# Patient Record
Sex: Female | Born: 1980
Health system: Southern US, Community
[De-identification: ages and names within clinical notes are randomized; demographics above are authoritative.]

## PROBLEM LIST (undated history)

## (undated) DIAGNOSIS — Z720 Tobacco use: Secondary | ICD-10-CM

## (undated) DIAGNOSIS — F329 Major depressive disorder, single episode, unspecified: Secondary | ICD-10-CM

## (undated) DIAGNOSIS — I059 Rheumatic mitral valve disease, unspecified: Secondary | ICD-10-CM

## (undated) DIAGNOSIS — I071 Rheumatic tricuspid insufficiency: Secondary | ICD-10-CM

## (undated) DIAGNOSIS — I319 Disease of pericardium, unspecified: Secondary | ICD-10-CM

## (undated) DIAGNOSIS — R519 Headache, unspecified: Secondary | ICD-10-CM

## (undated) DIAGNOSIS — J42 Unspecified chronic bronchitis: Secondary | ICD-10-CM

## (undated) DIAGNOSIS — I10 Essential (primary) hypertension: Secondary | ICD-10-CM

## (undated) DIAGNOSIS — R002 Palpitations: Secondary | ICD-10-CM

## (undated) DIAGNOSIS — I493 Ventricular premature depolarization: Secondary | ICD-10-CM

## (undated) DIAGNOSIS — Z9889 Other specified postprocedural states: Secondary | ICD-10-CM

## (undated) DIAGNOSIS — Z954 Presence of other heart-valve replacement: Secondary | ICD-10-CM

## (undated) DIAGNOSIS — F32A Depression, unspecified: Secondary | ICD-10-CM

## (undated) DIAGNOSIS — I5032 Chronic diastolic (congestive) heart failure: Secondary | ICD-10-CM

## (undated) DIAGNOSIS — F319 Bipolar disorder, unspecified: Secondary | ICD-10-CM

## (undated) DIAGNOSIS — R0600 Dyspnea, unspecified: Secondary | ICD-10-CM

## (undated) DIAGNOSIS — M545 Low back pain, unspecified: Secondary | ICD-10-CM

## (undated) DIAGNOSIS — I491 Atrial premature depolarization: Secondary | ICD-10-CM

## (undated) DIAGNOSIS — M1990 Unspecified osteoarthritis, unspecified site: Secondary | ICD-10-CM

## (undated) DIAGNOSIS — K219 Gastro-esophageal reflux disease without esophagitis: Secondary | ICD-10-CM

## (undated) DIAGNOSIS — G8929 Other chronic pain: Secondary | ICD-10-CM

## (undated) DIAGNOSIS — J189 Pneumonia, unspecified organism: Secondary | ICD-10-CM

## (undated) DIAGNOSIS — I052 Rheumatic mitral stenosis with insufficiency: Secondary | ICD-10-CM

## (undated) DIAGNOSIS — M5136 Other intervertebral disc degeneration, lumbar region: Secondary | ICD-10-CM

## (undated) DIAGNOSIS — R51 Headache: Secondary | ICD-10-CM

## (undated) DIAGNOSIS — D649 Anemia, unspecified: Secondary | ICD-10-CM

## (undated) DIAGNOSIS — F419 Anxiety disorder, unspecified: Secondary | ICD-10-CM

## (undated) DIAGNOSIS — I272 Pulmonary hypertension, unspecified: Secondary | ICD-10-CM

## (undated) DIAGNOSIS — M51369 Other intervertebral disc degeneration, lumbar region without mention of lumbar back pain or lower extremity pain: Secondary | ICD-10-CM

## (undated) DIAGNOSIS — R011 Cardiac murmur, unspecified: Secondary | ICD-10-CM

## (undated) DIAGNOSIS — D869 Sarcoidosis, unspecified: Secondary | ICD-10-CM

## (undated) DIAGNOSIS — E669 Obesity, unspecified: Secondary | ICD-10-CM

## (undated) HISTORY — DX: Obesity, unspecified: E66.9

## (undated) HISTORY — DX: Disease of pericardium, unspecified: I31.9

## (undated) HISTORY — DX: Other specified postprocedural states: Z98.890

## (undated) HISTORY — DX: Rheumatic mitral stenosis with insufficiency: I05.2

## (undated) HISTORY — DX: Anemia, unspecified: D64.9

## (undated) HISTORY — DX: Tobacco use: Z72.0

## (undated) HISTORY — DX: Atrial premature depolarization: I49.1

## (undated) HISTORY — DX: Ventricular premature depolarization: I49.3

## (undated) HISTORY — DX: Rheumatic tricuspid insufficiency: I07.1

## (undated) HISTORY — PX: HERNIA REPAIR: SHX51

## (undated) HISTORY — PX: ABDOMINAL HERNIA REPAIR: SHX539

## (undated) HISTORY — DX: Chronic diastolic (congestive) heart failure: I50.32

## (undated) HISTORY — DX: Sarcoidosis, unspecified: D86.9

## (undated) HISTORY — DX: Essential (primary) hypertension: I10

## (undated) HISTORY — DX: Rheumatic mitral valve disease, unspecified: I05.9

---
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
## 2009-09-13 HISTORY — PX: PARTIAL HYSTERECTOMY: SHX80

## 2009-09-13 HISTORY — PX: ABDOMINAL HYSTERECTOMY: SHX81

## 2012-09-13 HISTORY — PX: CARDIAC CATHETERIZATION: SHX172

## 2013-03-13 DIAGNOSIS — Z9889 Other specified postprocedural states: Secondary | ICD-10-CM

## 2013-03-13 HISTORY — DX: Other specified postprocedural states: Z98.890

## 2013-03-27 DIAGNOSIS — Z9889 Other specified postprocedural states: Secondary | ICD-10-CM

## 2013-03-27 HISTORY — PX: MITRAL VALVE REPAIR: SHX2039

## 2015-11-12 ENCOUNTER — Other Ambulatory Visit: Payer: Self-pay | Admitting: *Deleted

## 2015-11-12 ENCOUNTER — Ambulatory Visit (INDEPENDENT_AMBULATORY_CARE_PROVIDER_SITE_OTHER): Payer: Medicaid Other | Admitting: Cardiovascular Disease

## 2015-11-12 ENCOUNTER — Encounter: Payer: Self-pay | Admitting: *Deleted

## 2015-11-12 VITALS — BP 92/56 | HR 101 | Ht 63.0 in | Wt 236.0 lb

## 2015-11-12 DIAGNOSIS — R011 Cardiac murmur, unspecified: Secondary | ICD-10-CM

## 2015-11-12 DIAGNOSIS — Z9889 Other specified postprocedural states: Secondary | ICD-10-CM | POA: Diagnosis not present

## 2015-11-12 DIAGNOSIS — I10 Essential (primary) hypertension: Secondary | ICD-10-CM

## 2015-11-12 DIAGNOSIS — I509 Heart failure, unspecified: Secondary | ICD-10-CM | POA: Diagnosis not present

## 2015-11-12 DIAGNOSIS — Z9289 Personal history of other medical treatment: Secondary | ICD-10-CM

## 2015-11-12 DIAGNOSIS — Z87898 Personal history of other specified conditions: Secondary | ICD-10-CM

## 2015-11-12 NOTE — Patient Instructions (Signed)
Your physician recommends that you schedule a follow-up appointment in: 1 MONTH WITH DR. Purvis Sheffield  Your physician recommends that you continue on your current medications as directed. Please refer to the Current Medication list given to you today.  Your physician recommends that you return for lab work CBC/BMP  Your physician has requested that you have an echocardiogram. Echocardiography is a painless test that uses sound waves to create images of your heart. It provides your doctor with information about the size and shape of your heart and how well your heart's chambers and valves are working. This procedure takes approximately one hour. There are no restrictions for this procedure.   Thank you for choosing [facility]!!

## 2015-11-12 NOTE — Progress Notes (Signed)
Patient ID: Theresa Crawford, female   DOB: 12-28-1980, 35 y.o.   MRN: [id number]       CARDIOLOGY CONSULT NOTE  Patient ID: Theresa Crawford MRN: [id number] DOB/AGE: 04/22/1981 35 y.o.  Admit date: (Not on file) Primary Physician House, Eugenio Hoes, [name]  Reason for Consultation: Valvular heart disease  HPI: The patient is a 35 yr old woman who reportedly has a history of congestive heart failure and was hospitalized for this in November 2016 at [facility]. I do not have these records. She reportedly has a history of mitral valve repair in July 2014 with an annuloplasty ring but has had repeated episodes of CHF ever since. She moved to [place] from [place] in November 2016. She also has a h/o HTN.  She tells me that she has not felt well ever since her surgery. She experiences chest pain and exertional dyspnea after walking 50 yards. She sleeps with 3 pillows propped up in order to breathe easily. She describes paroxysmal nocturnal dyspnea. She also has lightheadedness and dizziness but no syncope. She said when she retains fluid it tends to collect in her abdomen and not her hands and feet. She tells me she had no blockages by preoperative cardiac catheterization. She said her blood pressure normally runs high yet it is 92/56 today. She is on Plavix but is uncertain why. She thinks she may have had a TIA. She said her problems all began 2009 when she was pregnant with her son and developed pericarditis She had untreated strep throat as a child.   Labs available from 08/12/15 showed hemoglobin 8.9, BNP 916.    Allergies  Allergen Reactions  . Lisinopril     Throat swelling  . Penicillins     Throat swelling  . Bee Venom   . Contrast Media [Iodinated Diagnostic Agents]     difinity  . Shellfish Allergy     Current Outpatient Prescriptions  Medication Sig Dispense Refill  . amLODipine (NORVASC) 5 MG tablet Take 5 mg by mouth daily.  3  . bumetanide (BUMEX) 1 MG tablet  Take 1 mg by mouth 2 (two) times daily.  3  . citalopram (CELEXA) 40 MG tablet Take 40 mg by mouth daily.    . clopidogrel (PLAVIX) 75 MG tablet Take 75 mg by mouth daily.  3  . ferrous sulfate 325 (65 FE) MG tablet Take 325 mg by mouth 2 (two) times daily with a meal.    . losartan (COZAAR) 50 MG tablet Take 50 mg by mouth 2 (two) times daily.  3  . metoprolol succinate (TOPROL-XL) 50 MG 24 hr tablet Take 50 mg by mouth daily.  3  . potassium chloride SA (K-DUR,KLOR-CON) 20 MEQ tablet Take 20 mEq by mouth 2 (two) times daily.  3  . QUEtiapine (SEROQUEL) 25 MG tablet Take 25 mg by mouth 2 (two) times daily.    Marland Kitchen spironolactone (ALDACTONE) 25 MG tablet Take 25 mg by mouth daily.  3  . STOOL SOFTENER 100 MG capsule Take 100 mg by mouth daily.  3   No current facility-administered medications for this visit.    Past Medical History  Diagnosis Date  . Mitral valve disease     mitral regurgitation  . CHF (congestive heart failure) (HCC)   . Hypertension     Past Surgical History  Procedure Laterality Date  . Mitral valve repair    . Partial hysterectomy  2011    PID w/problem with fallopian tubes  .  Cesarean section      X's 2    Social History   Social History  . Marital Status: Unknown    Spouse Name: N/A  . Number of Children: N/A  . Years of Education: N/A   Occupational History  . Not on file.   Social History Main Topics  . Smoking status: Current Every Day Smoker -- 0.25 packs/day    Types: Cigarettes    Start date: 11/03/1999  . Smokeless tobacco: Never Used  . Alcohol Use: Not on file  . Drug Use: Not on file  . Sexual Activity: Not on file   Other Topics Concern  . Not on file   Social History Narrative  . No narrative on file     No family history of premature CAD in 1st degree relatives.  Prior to Admission medications   Medication Sig Start Date End Date Taking? Authorizing Provider  amLODipine (NORVASC) 5 MG tablet Take 5 mg by mouth daily.  10/29/15   Historical Provider, MD  bumetanide (BUMEX) 1 MG tablet Take 1 mg by mouth 2 (two) times daily. 10/31/15   Historical Provider, MD  clopidogrel (PLAVIX) 75 MG tablet Take 75 mg by mouth daily. 10/29/15   Historical Provider, MD  losartan (COZAAR) 50 MG tablet Take 50 mg by mouth 2 (two) times daily. 10/29/15   Historical Provider, MD  metoprolol succinate (TOPROL-XL) 50 MG 24 hr tablet Take 50 mg by mouth daily. 10/29/15   Historical Provider, MD  potassium chloride SA (K-DUR,KLOR-CON) 20 MEQ tablet Take 20 mEq by mouth 2 (two) times daily. 10/29/15   Historical Provider, MD  spironolactone (ALDACTONE) 25 MG tablet Take 25 mg by mouth daily. 10/29/15   Historical Provider, MD  STOOL SOFTENER 100 MG capsule Take 100 mg by mouth daily. 10/29/15   Historical Provider, MD     Review of systems complete and found to be negative unless listed above in HPI     Physical exam Blood pressure 92/56, pulse 101, height  (1.6 m), weight 236 lb (107.049 kg). General: NAD Neck: No JVD, no thyromegaly or thyroid nodule.  Lungs: Clear to auscultation bilaterally with normal respiratory effort. CV: Nondisplaced PMI. Regular rate and rhythm, normal S1/S2, no S3/S4, 2/6 apical holosystolic murmur without radiation to axilla. Systolic murmur heard along left sternal border as well.  No peripheral edema.  No carotid bruit.   Abdomen: Soft, nontender, obese. Skin: Intact without lesions or rashes.  Neurologic: Alert and oriented x 3.  Psych: Normal affect. Extremities: No clubbing or cyanosis.  HEENT: Normal.   ECG: Most recent ECG reviewed.  Labs:  No results found for: WBC, HGB, HCT, MCV, PLT No results for input(s): NA, K, CL, CO2, BUN, CREATININE, CALCIUM, PROT, BILITOT, ALKPHOS, ALT, AST, GLUCOSE in the last 168 hours.  Invalid input(s): LABALBU No results found for: CKTOTAL, CKMB, CKMBINDEX, TROPONINI No results found for: CHOL No results found for: HDL No results found for: LDLCALC No  results found for: TRIG No results found for: CHOLHDL No results found for: LDLDIRECT       Studies: No results found.  ASSESSMENT AND PLAN:  1. CHF (unclear type) with h/o MV repair: Appears euvolemic at present. Will not adjust diuretic regimen. Will obtain records from [place] and [facility]. I will order a 2-D echocardiogram with Doppler to evaluate cardiac structure, function, and regional wall motion.   2. Essential HTN: Low normal today but normally high. No changes.  Dispo: f/u 1 month.  Signed: Prentice Docker, M.D., F.A.C.C.  11/12/2015, 8:37 AM

## 2015-11-20 ENCOUNTER — Other Ambulatory Visit: Payer: Medicaid Other

## 2015-11-26 ENCOUNTER — Encounter: Payer: Medicaid Other | Admitting: Physician Assistant

## 2015-11-26 ENCOUNTER — Encounter: Payer: Self-pay | Admitting: Physician Assistant

## 2015-11-26 NOTE — Assessment & Plan Note (Signed)
No show

## 2015-11-26 NOTE — Progress Notes (Signed)
No show

## 2015-12-05 ENCOUNTER — Encounter: Payer: Self-pay | Admitting: Cardiovascular Disease

## 2015-12-05 ENCOUNTER — Ambulatory Visit (INDEPENDENT_AMBULATORY_CARE_PROVIDER_SITE_OTHER): Payer: Medicaid Other | Admitting: Cardiovascular Disease

## 2015-12-05 VITALS — BP 98/65 | HR 84 | Ht 63.0 in | Wt 237.0 lb

## 2015-12-05 DIAGNOSIS — R011 Cardiac murmur, unspecified: Secondary | ICD-10-CM | POA: Diagnosis not present

## 2015-12-05 DIAGNOSIS — R519 Headache, unspecified: Secondary | ICD-10-CM

## 2015-12-05 DIAGNOSIS — R400 Somnolence: Secondary | ICD-10-CM

## 2015-12-05 DIAGNOSIS — I509 Heart failure, unspecified: Secondary | ICD-10-CM

## 2015-12-05 DIAGNOSIS — I10 Essential (primary) hypertension: Secondary | ICD-10-CM

## 2015-12-05 DIAGNOSIS — G4730 Sleep apnea, unspecified: Secondary | ICD-10-CM

## 2015-12-05 DIAGNOSIS — G4710 Hypersomnia, unspecified: Secondary | ICD-10-CM

## 2015-12-05 DIAGNOSIS — Z9889 Other specified postprocedural states: Secondary | ICD-10-CM | POA: Diagnosis not present

## 2015-12-05 DIAGNOSIS — R51 Headache: Secondary | ICD-10-CM

## 2015-12-05 NOTE — Patient Instructions (Signed)
Your physician has recommended that you have a sleep study. This test records several body functions during sleep, including: brain activity, eye movement, oxygen and carbon dioxide blood levels, heart rate and rhythm, breathing rate and rhythm, the flow of air through your mouth and nose, snoring, body muscle movements, and chest and belly movement. Office will contact with results via phone or letter.   Your physician wants you to follow up in: 6 months.  You will receive a reminder letter in the mail one-two months in advance.  If you don't receive a letter, please call our office to schedule the follow up appointment

## 2015-12-05 NOTE — Addendum Note (Signed)
Addended by: Lesle ChrisHILL, Cabrina Shiroma G on: 12/05/2015 09:26 AM   Modules accepted: Orders

## 2015-12-05 NOTE — Progress Notes (Signed)
Patient ID: Theresa Crawford, female   DOB: September 23, 1980, 35 y.o.   MRN: [id number]      SUBJECTIVE: The patient returns for follow-up of congestive heart failure and history of mitral valve repair. [facility]15 reportedly demonstrated normal left ventricular systolic function, EF 55-60%, moderate left atrial and mild right ventricular enlargement, mild mitral regurgitation, and mild to moderate tricuspid regurgitation, calculated RVSP 63 mmHg. There was no comment on diastolic function. She also had a normal V/Q scan in 07/2015.  She describes morning headaches and daytime sleepiness. She does not know if she snores. She feels short of breath intermittently in the mornings.    Review of Systems: As per "subjective", otherwise negative.  Allergies  Allergen Reactions  . Lisinopril     Throat swelling  . Penicillins     Throat swelling  . Bee Venom   . Contrast Media [Iodinated Diagnostic Agents]     difinity  . Shellfish Allergy     Current Outpatient Prescriptions  Medication Sig Dispense Refill  . amLODipine (NORVASC) 5 MG tablet Take 5 mg by mouth daily.  3  . bumetanide (BUMEX) 1 MG tablet Take 1 mg by mouth 2 (two) times daily.  3  . citalopram (CELEXA) 40 MG tablet Take 40 mg by mouth daily.    . clopidogrel (PLAVIX) 75 MG tablet Take 75 mg by mouth daily.  3  . ferrous sulfate 325 (65 FE) MG tablet Take 325 mg by mouth 2 (two) times daily with a meal.    . losartan (COZAAR) 50 MG tablet Take 50 mg by mouth 2 (two) times daily.  3  . metoprolol succinate (TOPROL-XL) 50 MG 24 hr tablet Take 50 mg by mouth daily.  3  . potassium chloride SA (K-DUR,KLOR-CON) 20 MEQ tablet Take 20 mEq by mouth 2 (two) times daily.  3  . QUEtiapine (SEROQUEL) 25 MG tablet Take 25 mg by mouth 2 (two) times daily.    Marland Kitchen. spironolactone (ALDACTONE) 25 MG tablet Take 25 mg by mouth daily.  3  . STOOL SOFTENER 100 MG capsule Take 100 mg by mouth daily.  3   No current  facility-administered medications for this visit.    Past Medical History  Diagnosis Date  . Mitral valve disease     mitral regurgitation  . CHF (congestive heart failure) (HCC)   . Hypertension     Past Surgical History  Procedure Laterality Date  . Mitral valve repair    . Partial hysterectomy  2011    PID w/problem with fallopian tubes  . Cesarean section      X's 2    Social History   Social History  . Marital Status: Unknown    Spouse Name: N/A  . Number of Children: N/A  . Years of Education: N/A   Occupational History  . Not on file.   Social History Main Topics  . Smoking status: Current Every Day Smoker -- 0.25 packs/day    Types: Cigarettes    Start date: 11/03/1999  . Smokeless tobacco: Never Used  . Alcohol Use: Not on file  . Drug Use: Not on file  . Sexual Activity: Not on file   Other Topics Concern  . Not on file   Social History Narrative     Filed Vitals:   12/05/15 0856  BP: 98/65  Pulse: 84  Height: 5\' 3"  (1.6 m)  Weight: 237 lb (107.502 kg)    PHYSICAL EXAM General: NAD  Neck: No JVD, no thyromegaly or thyroid nodule.  Lungs: Clear to auscultation bilaterally with normal respiratory effort. CV: Nondisplaced PMI. Regular rate and rhythm, normal S1/S2, no S3/S4, 2/6 apical holosystolic murmur without radiation to axilla. Systolic murmur heard along left sternal border as well. No peripheral edema.   Abdomen: Soft, nontender, obese. Skin: Intact without lesions or rashes.  Neurologic: Alert and oriented x 3.  Psych: Normal affect. Extremities: No clubbing or cyanosis.  HEENT: Normal.   ECG: Most recent ECG reviewed.      ASSESSMENT AND PLAN: 1. CHF (unclear type, presumably diastolic) with h/o MV repair: Appears euvolemic at present. Will not adjust diuretic regimen. Echo results from 07/2015 noted above.  2. Essential HTN: Low normal again today but normally high. No changes.  3. Daytime somnolence and morning  headaches: Given body habitus and hypertension, she is at risk for sleep apnea. Will obtain a sleep study.  Dispo: f/u 6 months.   Prentice Docker, M.D., F.A.C.C.

## 2015-12-09 ENCOUNTER — Telehealth: Payer: Self-pay | Admitting: Cardiovascular Disease

## 2015-12-09 NOTE — Telephone Encounter (Signed)
Called patient to give appt information for Dr Juanetta GoslingHawkins December 31, 2015 @ 9:30am

## 2016-02-02 ENCOUNTER — Emergency Department (HOSPITAL_COMMUNITY)
Admission: EM | Admit: 2016-02-02 | Discharge: 2016-02-03 | Disposition: A | Discharge status: Home / Self Care | Payer: Medicaid Other | Attending: Emergency Medicine | Admitting: Emergency Medicine

## 2016-02-02 ENCOUNTER — Encounter (HOSPITAL_COMMUNITY): Payer: Self-pay | Admitting: *Deleted

## 2016-02-02 ENCOUNTER — Emergency Department (HOSPITAL_COMMUNITY): Payer: Medicaid Other

## 2016-02-02 DIAGNOSIS — R0789 Other chest pain: Secondary | ICD-10-CM | POA: Insufficient documentation

## 2016-02-02 DIAGNOSIS — I110 Hypertensive heart disease with heart failure: Secondary | ICD-10-CM | POA: Diagnosis not present

## 2016-02-02 DIAGNOSIS — Z79899 Other long term (current) drug therapy: Secondary | ICD-10-CM | POA: Insufficient documentation

## 2016-02-02 DIAGNOSIS — I509 Heart failure, unspecified: Secondary | ICD-10-CM | POA: Insufficient documentation

## 2016-02-02 DIAGNOSIS — F17210 Nicotine dependence, cigarettes, uncomplicated: Secondary | ICD-10-CM | POA: Diagnosis not present

## 2016-02-02 DIAGNOSIS — M549 Dorsalgia, unspecified: Secondary | ICD-10-CM | POA: Insufficient documentation

## 2016-02-02 LAB — CBC WITH DIFFERENTIAL/PLATELET
BASOS PCT: 0 %
Basophils Absolute: 0 10*3/uL (ref 0.0–0.1)
EOS ABS: 0.2 10*3/uL (ref 0.0–0.7)
EOS PCT: 2 %
HCT: 34 % — ABNORMAL LOW (ref 36.0–46.0)
HEMOGLOBIN: 10.9 g/dL — AB (ref 12.0–15.0)
Lymphocytes Relative: 20 %
Lymphs Abs: 2 10*3/uL (ref 0.7–4.0)
MCH: 25.8 pg — ABNORMAL LOW (ref 26.0–34.0)
MCHC: 32.1 g/dL (ref 30.0–36.0)
MCV: 80.6 fL (ref 78.0–100.0)
MONOS PCT: 9 %
Monocytes Absolute: 0.9 10*3/uL (ref 0.1–1.0)
NEUTROS PCT: 69 %
Neutro Abs: 6.7 10*3/uL (ref 1.7–7.7)
PLATELETS: 303 10*3/uL (ref 150–400)
RBC: 4.22 MIL/uL (ref 3.87–5.11)
RDW: 16.9 % — ABNORMAL HIGH (ref 11.5–15.5)
WBC: 9.8 10*3/uL (ref 4.0–10.5)

## 2016-02-02 LAB — COMPREHENSIVE METABOLIC PANEL
ALBUMIN: 4.2 g/dL (ref 3.5–5.0)
ALT: 13 U/L — ABNORMAL LOW (ref 14–54)
ANION GAP: 7 (ref 5–15)
AST: 18 U/L (ref 15–41)
Alkaline Phosphatase: 62 U/L (ref 38–126)
BUN: 12 mg/dL (ref 6–20)
CHLORIDE: 103 mmol/L (ref 101–111)
CO2: 26 mmol/L (ref 22–32)
CREATININE: 0.76 mg/dL (ref 0.44–1.00)
Calcium: 8.5 mg/dL — ABNORMAL LOW (ref 8.9–10.3)
GFR calc non Af Amer: >60 mL/min (ref >60)
Glucose, Bld: 100 mg/dL — ABNORMAL HIGH (ref 65–99)
POTASSIUM: 3.6 mmol/L (ref 3.5–5.1)
SODIUM: 136 mmol/L (ref 135–145)
Total Bilirubin: 0.2 mg/dL — ABNORMAL LOW (ref 0.3–1.2)
Total Protein: 7.9 g/dL (ref 6.5–8.1)

## 2016-02-02 LAB — URINALYSIS, ROUTINE W REFLEX MICROSCOPIC
Bilirubin Urine: NEGATIVE
Glucose, UA: NEGATIVE mg/dL
HGB URINE DIPSTICK: NEGATIVE
Ketones, ur: NEGATIVE mg/dL
LEUKOCYTES UA: NEGATIVE
NITRITE: NEGATIVE
PROTEIN: NEGATIVE mg/dL
Specific Gravity, Urine: 1.01 (ref 1.005–1.030)
pH: 5.5 (ref 5.0–8.0)

## 2016-02-02 LAB — TROPONIN I

## 2016-02-02 NOTE — ED Notes (Signed)
Pt reports intermittent left sided chest pain that hurts when she takes a deep breath or moves in certain directions.

## 2016-02-02 NOTE — ED Notes (Signed)
Physician in to assess patient.

## 2016-02-02 NOTE — ED Notes (Signed)
EKG performed upon arrival to room 5; handed EKG to Dr Manus Gunningancour.

## 2016-02-02 NOTE — ED Notes (Signed)
Pt has had a valve replacement/re[pair and has seen cardiologist in the past. She reports she doesn't recall his mname but his office is in [place]

## 2016-02-02 NOTE — ED Provider Notes (Signed)
CSN: [id number]     Arrival date & time 02/02/16  [id number] History  By signing my name below, I, Linus GalasMaharshi Patel, attest that this documentation has been prepared under the direction and in the presence of Vanetta MuldersScott Alayjah Boehringer, MD. Electronically Signed: Linus GalasMaharshi Patel, ED Scribe. 02/03/2016. 10:13 PM.    Chief Complaint  Patient presents with  . Chest Pain   The history is provided by the patient. No language interpreter was used.   HPI Comments: Theresa Crawford is a 35 y.o. female with a PMHx of mitral valve disease, HTN, and CHF who presents to the Emergency Department complaining of constant waxing and waning left anterior chest pain that began 2200 1 day ago and has been gradually worsening. Pt rates her pain an 8/10 in severity. She states her pain is aggravated with breathing and left arm movements. Pt did not take any OTC medications for her pain. Pt has no other complaints at this time. Pt takes Plavix daily.   Past Medical History  Diagnosis Date  . Mitral valve disease     mitral regurgitation  . CHF (congestive heart failure) (HCC)   . Hypertension   . History of open heart surgery    Past Surgical History  Procedure Laterality Date  . Mitral valve repair    . Partial hysterectomy  2011    PID w/problem with fallopian tubes  . Cesarean section      X's 2  . Abdominal hysterectomy    . Open heart surgery     Family History  Problem Relation Age of Onset  . Heart failure Father     just received LVAD  . Heart disease Paternal Grandfather     stent placement   Social History  Substance Use Topics  . Smoking status: Current Every Day Smoker -- 0.25 packs/day    Types: Cigarettes    Start date: 11/03/1999  . Smokeless tobacco: Never Used  . Alcohol Use: No   OB History    No data available     Review of Systems  Constitutional: Negative for fever and chills.  HENT: Negative for rhinorrhea and sore throat.   Eyes: Negative for visual disturbance.  Respiratory:  Negative for cough and shortness of breath.   Cardiovascular: Positive for chest pain.  Gastrointestinal: Negative for nausea, vomiting, abdominal pain and diarrhea.  Genitourinary: Negative for dysuria.  Musculoskeletal: Positive for back pain. Negative for joint swelling.  Skin: Negative for rash.  Neurological: Negative for headaches.  Hematological: Bruises/bleeds easily.  Psychiatric/Behavioral: Negative for confusion.   Allergies  Lisinopril; Penicillins; Bee venom; Contrast media; and Shellfish allergy  Home Medications   Prior to Admission medications   Medication Sig Start Date End Date Taking? Authorizing Provider  amLODipine (NORVASC) 5 MG tablet Take 5 mg by mouth daily. 10/29/15  Yes Historical Provider, MD  bumetanide (BUMEX) 1 MG tablet Take 1 mg by mouth 2 (two) times daily. 10/31/15  Yes Historical Provider, MD  citalopram (CELEXA) 40 MG tablet Take 40 mg by mouth daily.   Yes Historical Provider, MD  clopidogrel (PLAVIX) 75 MG tablet Take 75 mg by mouth daily. 10/29/15  Yes Historical Provider, MD  ferrous sulfate 325 (65 FE) MG tablet Take 325 mg by mouth 2 (two) times daily with a meal.   Yes Historical Provider, MD  losartan (COZAAR) 50 MG tablet Take 50 mg by mouth 2 (two) times daily. 10/29/15  Yes Historical Provider, MD  metoprolol succinate (TOPROL-XL) 50 MG 24 hr tablet  Take 50 mg by mouth daily. 10/29/15  Yes Historical Provider, MD  potassium chloride SA (K-DUR,KLOR-CON) 20 MEQ tablet Take 20 mEq by mouth 2 (two) times daily. 10/29/15  Yes Historical Provider, MD  QUEtiapine (SEROQUEL) 25 MG tablet Take 25 mg by mouth 2 (two) times daily.   Yes Historical Provider, MD  spironolactone (ALDACTONE) 25 MG tablet Take 25 mg by mouth daily. 10/29/15  Yes Historical Provider, MD  STOOL SOFTENER 100 MG capsule Take 100 mg by mouth daily. 10/29/15  Yes Historical Provider, MD  naproxen (NAPROSYN) 500 MG tablet Take 1 tablet (500 mg total) by mouth 2 (two) times daily. 02/03/16    Vanetta Mulders, MD   BP 102/73 mmHg  Pulse 74  Temp(Src) 98 F (36.7 C) (Oral)  Resp 20  Ht  (1.651 m)  Wt 109.317 kg  BMI 40.10 kg/m2  SpO2 100%  LMP 01/25/2016   Physical Exam  Constitutional: She is oriented to person, place, and time. She appears well-developed and well-nourished.  HENT:  Head: Normocephalic and atraumatic.  Mouth/Throat: Mucous membranes are normal.  Eyes: EOM are normal. Pupils are equal, round, and reactive to light. No scleral icterus.  Cardiovascular: Normal rate, regular rhythm and normal heart sounds.   Pulmonary/Chest: Effort normal and breath sounds normal. She exhibits tenderness.  Reproducible left wall chest tenderness  Abdominal: Bowel sounds are normal. There is no tenderness.  Musculoskeletal: She exhibits no edema.  Neurological: She is alert and oriented to person, place, and time. No cranial nerve deficit. She exhibits normal muscle tone. Coordination normal.  Skin: Skin is warm and dry.  Psychiatric: She has a normal mood and affect.  Nursing note and vitals reviewed.   ED Course  Procedures  DIAGNOSTIC STUDIES: Oxygen Saturation is 100% on room air, normal by my interpretation.    COORDINATION OF CARE: 10:07 PM Discussed treatment plan with pt at bedside and pt agreed to plan.  Labs Review Labs Reviewed  CBC WITH DIFFERENTIAL/PLATELET - Abnormal; Notable for the following:    Hemoglobin 10.9 (*)    HCT 34.0 (*)    MCH 25.8 (*)    RDW 16.9 (*)    All other components within normal limits  COMPREHENSIVE METABOLIC PANEL - Abnormal; Notable for the following:    Glucose, Bld 100 (*)    Calcium 8.5 (*)    ALT 13 (*)    Total Bilirubin 0.2 (*)    All other components within normal limits  TROPONIN I  URINALYSIS, ROUTINE W REFLEX MICROSCOPIC (NOT AT [facility])    Imaging Review Dg Chest 2 View  02/02/2016  CLINICAL DATA:  Left-sided chest pain since last night. EXAM: CHEST  2 VIEW COMPARISON:  11/20/2015 FINDINGS:  Sternotomy wires prosthetic mitral valve are present. Lungs are adequately inflated and demonstrate mild prominence of the perihilar markings suggesting minimal vascular congestion. Mild stable cardiomegaly. Remainder of the exam is unchanged. IMPRESSION: Mild cardiomegaly with minimal vascular congestion. Electronically Signed   By: Elberta Fortis M.D.   On: 02/02/2016 23:51   I have personally reviewed and evaluated these images and lab results as part of my medical decision-making.   EKG Interpretation   Date/Time:  Monday Feb 02 2016 19:41:56 EDT Ventricular Rate:  79 PR Interval:  155 QRS Duration: 86 QT Interval:  386 QTC Calculation: 442 R Axis:   45 Text Interpretation:  Sinus rhythm Left atrial enlargement Abnormal R-wave  progression, early transition No previous ECGs available Confirmed by  [employer]  MD, Jeannett Senior [phone]) on 02/02/2016 8:07:38 PM      MDM   Final diagnoses:  Acute chest wall pain    Patient's chest x-ray is negative for pneumonia pneumothorax or acute pulmonary edema. Shows some pulmonary vascularization congestion. Patient does have a history of CHF. No evidence of congestive heart failure at this time. Patient's chest x-ray is reproducible on the left side of her chest consistent with chest wall pain. Troponin was negative EKG without any acute findings. Patient will be treated with Naprosyn for the chest wall pain. She has a primary care doctor to follow-up with.  I personally performed the services described in this documentation, which was scribed in my presence. The recorded information has been reviewed and is accurate.      Vanetta Mulders, MD 02/03/16 [phone]

## 2016-02-02 NOTE — ED Notes (Signed)
Patient walked to the bathroom with minimal assistance.  

## 2016-02-02 NOTE — ED Notes (Signed)
From Radiology 

## 2016-02-03 MED ORDER — NAPROXEN 250 MG PO TABS
500.0000 mg | ORAL_TABLET | Freq: Once | ORAL | Status: AC
  Administered 2016-02-03: 500 mg via ORAL
  Filled 2016-02-03: qty 2

## 2016-02-03 MED ORDER — NAPROXEN 500 MG PO TABS: 500.0000 mg | ORAL_TABLET | Freq: Two times a day (BID) | ORAL | Status: DC

## 2016-02-03 NOTE — Discharge Instructions (Signed)
Chest Wall Pain Chest wall pain is pain in or around the bones and muscles of your chest. Sometimes, an injury causes this pain. Sometimes, the cause may not be known. This pain may take several weeks or longer to get better. HOME CARE Pay attention to any changes in your symptoms. Take these actions to help with your pain:  Rest as told by your doctor.  Avoid activities that cause pain. Try not to use your chest, belly (abdominal), or side muscles to lift heavy things.  If directed, apply ice to the painful area:  Put ice in a plastic bag.  Place a towel between your skin and the bag.  Leave the ice on for 20 minutes, 2-3 times per day.  Take over-the-counter and prescription medicines only as told by your doctor.  Do not use tobacco products, including cigarettes, chewing tobacco, and e-cigarettes. If you need help quitting, ask your doctor.  Keep all follow-up visits as told by your doctor. This is important. GET HELP IF:  You have a fever.  Your chest pain gets worse.  You have new symptoms. GET HELP RIGHT AWAY IF:  You feel sick to your stomach (nauseous) or you throw up (vomit).  You feel sweaty or light-headed.  You have a cough with phlegm (sputum) or you cough up blood.  You are short of breath.   This information is not intended to replace advice given to you by your health care provider. Make sure you discuss any questions you have with your health care provider.  Chest x-ray was negative for any acute findings. Take the Naprosyn as directed make an appointment to follow-up with regular doctor. Return for any new or worse symptoms.   Document Released: 02/16/2008 Document Revised: 05/21/2015 Document Reviewed: 11/25/2014 Elsevier Interactive Patient Education [employer]2016 [employer].

## 2016-02-15 NOTE — Progress Notes (Signed)
Cardiology Office Note   Date:  02/16/2016   ID:  Theresa Crawford, DOB [birth year], MRN [id number]  PCP:  Jerrell BelfastHouse, [name], [name]  Cardiologist:  Inis SizerKoneswaran/Cade Dashner, NP   No chief complaint on file.     History of Present Illness: Theresa Crawford is a 35 y.o. femalenormally seen in the [place] office,  who presents for ongoing assessment and management of chronic diastolic CHF, HTN, with history of mitral valve repair. She was last seen by Dr. Purvis SheffieldKoneswaran on 12/05/2015 after admission to [facility] for decompensated CHF. She was recommended for sleep study due to daytime somnolence and morning headaches.   She was seen in the ER on 02/02/2016 for chest pain aggravated with breathing and arm movement. She was ruled out for ACS, pneumonia and was found pulmonary vascular congestion, but no overt CHF. She was treated with NSAIDS and released.  She is doing well today but suddenly developed dyspnea and began to cry. Unable to talk with some upper airway raspy breathing. She denies chest pain, but is having significant issues with inhaling. She feels constricted in her upper throat.   Past Medical History  Diagnosis Date  . Mitral valve disease     mitral regurgitation  . CHF (congestive heart failure) (HCC)   . Hypertension   . History of open heart surgery     Past Surgical History  Procedure Laterality Date  . Mitral valve repair    . Partial hysterectomy  2011    PID w/problem with fallopian tubes  . Cesarean section      X's 2  . Abdominal hysterectomy    . Open heart surgery       Current Outpatient Prescriptions  Medication Sig Dispense Refill  . amLODipine (NORVASC) 5 MG tablet Take 5 mg by mouth daily.  3  . bumetanide (BUMEX) 1 MG tablet Take 1 mg by mouth 2 (two) times daily.  3  . citalopram (CELEXA) 40 MG tablet Take 40 mg by mouth daily.    . clopidogrel (PLAVIX) 75 MG tablet Take 75 mg by mouth daily.  3  . ferrous sulfate 325 (65 FE) MG tablet Take 325 mg by mouth 2  (two) times daily with a meal.    . losartan (COZAAR) 50 MG tablet Take 50 mg by mouth 2 (two) times daily.  3  . metoprolol succinate (TOPROL-XL) 50 MG 24 hr tablet Take 50 mg by mouth daily.  3  . potassium chloride SA (K-DUR,KLOR-CON) 20 MEQ tablet Take 20 mEq by mouth 2 (two) times daily.  3  . QUEtiapine (SEROQUEL) 25 MG tablet Take 25 mg by mouth 2 (two) times daily.    Marland Kitchen. spironolactone (ALDACTONE) 25 MG tablet Take 25 mg by mouth daily.  3  . STOOL SOFTENER 100 MG capsule Take 100 mg by mouth daily.  3   No current facility-administered medications for this visit.    Allergies:   Lisinopril; Penicillins; Bee venom; Contrast media; and Shellfish allergy    Social History:  The patient  reports that she has been smoking Cigarettes.  She started smoking about 16 years ago. She has been smoking about 0.25 packs per day. She has never used smokeless tobacco. She reports that she does not drink alcohol or use illicit drugs.   Family History:  The patient's family history includes Heart disease in her paternal grandfather; Heart failure in her father.    ROS: All other systems are reviewed and negative. Unless otherwise mentioned in  H&P    PHYSICAL EXAM: VS:  BP 96/52 mmHg  Pulse 90  Ht  (1.626 m)  Wt 230 lb (104.327 kg)  BMI 39.46 kg/m2  SpO2 98%  LMP 02/16/2016 , BMI Body mass index is 39.46 kg/(m^2). GEN: Well nourished, well developed, in no acute distress HEENT: normal Neck: no JVD, carotid bruits, or masses Cardiac: RRR; tachycardic,  no murmurs, rubs, or gallops,no edema  Respiratory:  clear to auscultation bilaterally, Dyspnea at rest.  GI: soft, nontender, nondistended, + BS MS: no deformity or atrophy Skin: warm and dry, no rash Neuro:  Strength and sensation are intact Psych: euthymic mood, full affect   Recent Labs: 02/02/2016: ALT 13*; BUN 12; Creatinine, Ser 0.76; Hemoglobin 10.9*; Platelets 303; Potassium 3.6; Sodium 136    Lipid Panel No results  found for: CHOL, TRIG, HDL, CHOLHDL, VLDL, LDLCALC, LDLDIRECT    Wt Readings from Last 3 Encounters:  02/16/16 230 lb (104.327 kg)  02/02/16 241 lb (109.317 kg)  12/05/15 237 lb (107.502 kg)     Echocardiogram;Transcribed from scanned document: 11/19/2015 Left ventricular chamber size wall thickness and systolic function was normal. There were no wall motion abnormalities observed. Ejection fraction is normal. The left ventricular chamber size is normal. Global left ventricular wall motion and contractility are within normal limits. There is normal left ventricular systolic function. The estimated EF is 55-60%.  ASSESSMENT AND PLAN:  1.Acte respiratory distress: This happened when she took deep breaths during examination respiratory status. She began to have raspiness in her throat trouble breathing became tearful and anxious.after approximately 10 minutes this did not improve. The patient is sent to the emergency room for further evaluation. She may need nebulizer breathing treatment. The patient has been seen in the past for bronchitis but had not been seen by a pulmonologist and she moved here from [place].  2. Hypertension: On multiple medications to include amlodipine, losartan, metoprolol, and spironolactone.blood pressure soft. Recommend decreasing amlodipine to 2.5 mg or discontinuing altogether. The patient will need a followup after evaluation in ER. She is normally seen in the [place] office   Current medicines are reviewed at length with the patient today.    Labs/ tests ordered today include:  No orders of the defined types were placed in this encounter.     Disposition:   FU with Dr. Purvis Sheffield in [place].    Signed, Joni Reining, NP  02/16/2016 2:39 PM    [facility] [address] [place], [place] [address] Phone: [phone]; Fax: [phone]

## 2016-02-16 ENCOUNTER — Encounter: Payer: Self-pay | Admitting: Adult Health

## 2016-02-16 ENCOUNTER — Emergency Department (HOSPITAL_COMMUNITY)
Admission: EM | Admit: 2016-02-16 | Discharge: 2016-02-16 | Disposition: A | Discharge status: Home / Self Care | Payer: Medicaid Other | Attending: Dermatology | Admitting: Dermatology

## 2016-02-16 ENCOUNTER — Ambulatory Visit (INDEPENDENT_AMBULATORY_CARE_PROVIDER_SITE_OTHER): Payer: Medicaid Other | Admitting: Adult Health

## 2016-02-16 ENCOUNTER — Encounter (HOSPITAL_COMMUNITY): Payer: Self-pay | Admitting: *Deleted

## 2016-02-16 ENCOUNTER — Emergency Department (HOSPITAL_COMMUNITY): Payer: Medicaid Other

## 2016-02-16 VITALS — BP 96/52 | HR 90 | Ht 64.0 in | Wt 230.0 lb

## 2016-02-16 DIAGNOSIS — R0602 Shortness of breath: Secondary | ICD-10-CM | POA: Insufficient documentation

## 2016-02-16 DIAGNOSIS — I10 Essential (primary) hypertension: Secondary | ICD-10-CM | POA: Diagnosis not present

## 2016-02-16 DIAGNOSIS — R0603 Acute respiratory distress: Secondary | ICD-10-CM | POA: Insufficient documentation

## 2016-02-16 DIAGNOSIS — F17210 Nicotine dependence, cigarettes, uncomplicated: Secondary | ICD-10-CM | POA: Insufficient documentation

## 2016-02-16 DIAGNOSIS — I509 Heart failure, unspecified: Secondary | ICD-10-CM | POA: Insufficient documentation

## 2016-02-16 DIAGNOSIS — R0600 Dyspnea, unspecified: Secondary | ICD-10-CM

## 2016-02-16 DIAGNOSIS — Z5321 Procedure and treatment not carried out due to patient leaving prior to being seen by health care provider: Secondary | ICD-10-CM | POA: Diagnosis not present

## 2016-02-16 DIAGNOSIS — I110 Hypertensive heart disease with heart failure: Secondary | ICD-10-CM | POA: Insufficient documentation

## 2016-02-16 NOTE — ED Notes (Signed)
RN informed by registration that pt stated she feels better and will follow up with her physician.

## 2016-02-16 NOTE — ED Notes (Signed)
Pt was at [facility] today for a f/u appt from the ED for muscle pain and vascular congestion when she started having SOB and dizziness all of a sudden. Pt reports she felt normal prior to arriving for f/u appt. Denies chest pain, cough.

## 2016-02-16 NOTE — Progress Notes (Signed)
Name: Theresa Crawford    DOB: July 11, 1981  Age: 35 y.o.  MR#: [id number]       PCP:  Jerrell BelfastHouse, [name], [name]      Insurance: Payor: MEDICAID [place] / Plan: MEDICAID [place] ACCESS / Product Type: *No Product type* /   CC:   No chief complaint on file.   VS Filed Vitals:   02/16/16 1424  BP: 96/52  Pulse: 90  Height: 5\' 4"  (1.626 m)  Weight: 409230 lb (104.327 kg)  SpO2: 98%    Weights Current Weight  02/16/16 230 lb (104.327 kg)  02/02/16 241 lb (109.317 kg)  12/05/15 237 lb (107.502 kg)    Blood Pressure  BP Readings from Last 3 Encounters:  02/16/16 96/52  02/03/16 99/64  12/05/15 98/65     Admit date:  (Not on file) Last encounter with RMR:  Visit date not found   Allergy Lisinopril; Penicillins; Bee venom; Contrast media; and Shellfish allergy  Current Outpatient Prescriptions  Medication Sig Dispense Refill  . amLODipine (NORVASC) 5 MG tablet Take 5 mg by mouth daily.  3  . bumetanide (BUMEX) 1 MG tablet Take 1 mg by mouth 2 (two) times daily.  3  . citalopram (CELEXA) 40 MG tablet Take 40 mg by mouth daily.    . clopidogrel (PLAVIX) 75 MG tablet Take 75 mg by mouth daily.  3  . ferrous sulfate 325 (65 FE) MG tablet Take 325 mg by mouth 2 (two) times daily with a meal.    . losartan (COZAAR) 50 MG tablet Take 50 mg by mouth 2 (two) times daily.  3  . metoprolol succinate (TOPROL-XL) 50 MG 24 hr tablet Take 50 mg by mouth daily.  3  . potassium chloride SA (K-DUR,KLOR-CON) 20 MEQ tablet Take 20 mEq by mouth 2 (two) times daily.  3  . QUEtiapine (SEROQUEL) 25 MG tablet Take 25 mg by mouth 2 (two) times daily.    Marland Kitchen. spironolactone (ALDACTONE) 25 MG tablet Take 25 mg by mouth daily.  3  . STOOL SOFTENER 100 MG capsule Take 100 mg by mouth daily.  3   No current facility-administered medications for this visit.    Discontinued Meds:    Medications Discontinued During This Encounter  Medication Reason  . naproxen (NAPROSYN) 500 MG tablet Error    Patient Active Problem List    Diagnosis Date Noted  . CHF (congestive heart failure) (HCC) 11/26/2015    LABS    Component Value Date/Time   NA 136 02/02/2016 1941   K 3.6 02/02/2016 1941   CL 103 02/02/2016 1941   CO2 26 02/02/2016 1941   GLUCOSE 100* 02/02/2016 1941   BUN 12 02/02/2016 1941   CREATININE 0.76 02/02/2016 1941   CALCIUM 8.5* 02/02/2016 1941   GFRNONAA >60 02/02/2016 1941   GFRAA >60 02/02/2016 1941   CMP     Component Value Date/Time   NA 136 02/02/2016 1941   K 3.6 02/02/2016 1941   CL 103 02/02/2016 1941   CO2 26 02/02/2016 1941   GLUCOSE 100* 02/02/2016 1941   BUN 12 02/02/2016 1941   CREATININE 0.76 02/02/2016 1941   CALCIUM 8.5* 02/02/2016 1941   PROT 7.9 02/02/2016 1941   ALBUMIN 4.2 02/02/2016 1941   AST 18 02/02/2016 1941   ALT 13* 02/02/2016 1941   ALKPHOS 62 02/02/2016 1941   BILITOT 0.2* 02/02/2016 1941   GFRNONAA >60 02/02/2016 1941   GFRAA >60 02/02/2016 1941  Component Value Date/Time   WBC 9.8 02/02/2016 1941   HGB 10.9* 02/02/2016 1941   HCT 34.0* 02/02/2016 1941   MCV 80.6 02/02/2016 1941    Lipid Panel  No results found for: CHOL, TRIG, HDL, CHOLHDL, VLDL, LDLCALC, LDLDIRECT  ABG No results found for: PHART, PCO2ART, PO2ART, HCO3, TCO2, ACIDBASEDEF, O2SAT   No results found for: TSH BNP (last 3 results) No results for input(s): BNP in the last 8760 hours.  ProBNP (last 3 results) No results for input(s): PROBNP in the last 8760 hours.  Cardiac Panel (last 3 results) No results for input(s): CKTOTAL, CKMB, TROPONINI, RELINDX in the last 72 hours.  Iron/TIBC/Ferritin/ %Sat No results found for: IRON, TIBC, FERRITIN, IRONPCTSAT   EKG Orders placed or performed during the hospital encounter of 02/02/16  . ED EKG  . ED EKG  . EKG 12-Lead  . EKG 12-Lead  . EKG     Prior Assessment and Plan Problem List as of 02/16/2016      Cardiovascular and Mediastinum   CHF (congestive heart failure) [facility])   Last Assessment & Plan 11/26/2015  Erroneous Encounter Written 11/26/2015  2:40 PM by Dyann Kief, PA-C    No show          Imaging: Dg Chest 2 View  02/02/2016  CLINICAL DATA:  Left-sided chest pain since last night. EXAM: CHEST  2 VIEW COMPARISON:  11/20/2015 FINDINGS: Sternotomy wires prosthetic mitral valve are present. Lungs are adequately inflated and demonstrate mild prominence of the perihilar markings suggesting minimal vascular congestion. Mild stable cardiomegaly. Remainder of the exam is unchanged. IMPRESSION: Mild cardiomegaly with minimal vascular congestion. Electronically Signed   By: Elberta Fortis M.D.   On: 02/02/2016 23:51

## 2016-02-23 ENCOUNTER — Encounter: Payer: Medicaid Other | Admitting: Adult Health

## 2016-02-23 NOTE — Progress Notes (Signed)
Cardiology Office Note   Date:  02/23/2016   ID:  Janalee DaneLatasha Y Dieter, DOB 1981/01/26, MRN [id number]  PCP:  Jerrell BelfastHouse, [name], [name]  Cardiologist:Koneswaran/ Joni ReiningKathryn Starsky Nanna, NP   No chief complaint on file. Rescheduled

## 2016-02-24 ENCOUNTER — Ambulatory Visit (INDEPENDENT_AMBULATORY_CARE_PROVIDER_SITE_OTHER): Payer: Medicaid Other | Admitting: Adult Health

## 2016-02-24 ENCOUNTER — Encounter: Payer: Self-pay | Admitting: Adult Health

## 2016-02-24 VITALS — BP 124/72 | HR 91 | Ht 64.0 in | Wt 225.0 lb

## 2016-02-24 DIAGNOSIS — I10 Essential (primary) hypertension: Secondary | ICD-10-CM

## 2016-02-24 DIAGNOSIS — R0600 Dyspnea, unspecified: Secondary | ICD-10-CM

## 2016-02-24 DIAGNOSIS — R0603 Acute respiratory distress: Secondary | ICD-10-CM

## 2016-02-24 NOTE — Progress Notes (Deleted)
Name: Theresa Crawford    DOB: Oct 17, 1980  Age: 35 y.o.  MR#: [id number]       PCP:  Jerrell Belfast, [name]      Insurance: Payor: MEDICAID [place] / Plan: MEDICAID [place] ACCESS / Product Type: *No Product type* /   CC:   No chief complaint on file.   VS Filed Vitals:   02/24/16 1357  BP: 124/72  Pulse: 91  Height:  (1.626 m)  Weight: 225 lb (102.059 kg)  SpO2: 97%    Weights Current Weight  02/24/16 225 lb (102.059 kg)  02/16/16 229 lb (103.874 kg)  02/16/16 230 lb (104.327 kg)    Blood Pressure  BP Readings from Last 3 Encounters:  02/24/16 124/72  02/16/16 93/58  02/16/16 96/52     Admit date:  (Not on file) Last encounter with RMR:  02/16/2016   Allergy Lisinopril; Penicillins; Bee venom; Contrast media; and Shellfish allergy  Current Outpatient Prescriptions  Medication Sig Dispense Refill  . amLODipine (NORVASC) 5 MG tablet Take 5 mg by mouth daily.  3  . bumetanide (BUMEX) 1 MG tablet Take 1 mg by mouth 2 (two) times daily.  3  . citalopram (CELEXA) 40 MG tablet Take 40 mg by mouth daily.    . clopidogrel (PLAVIX) 75 MG tablet Take 75 mg by mouth daily.  3  . ferrous sulfate 325 (65 FE) MG tablet Take 325 mg by mouth 2 (two) times daily with a meal.    . losartan (COZAAR) 50 MG tablet Take 50 mg by mouth 2 (two) times daily.  3  . metoprolol succinate (TOPROL-XL) 50 MG 24 hr tablet Take 50 mg by mouth daily.  3  . potassium chloride SA (K-DUR,KLOR-CON) 20 MEQ tablet Take 20 mEq by mouth 2 (two) times daily.  3  . QUEtiapine (SEROQUEL) 25 MG tablet Take 25 mg by mouth 2 (two) times daily.    Marland Kitchen spironolactone (ALDACTONE) 25 MG tablet Take 25 mg by mouth daily.  3  . STOOL SOFTENER 100 MG capsule Take 100 mg by mouth daily.  3   No current facility-administered medications for this visit.    Discontinued Meds:   There are no discontinued medications.  Patient Active Problem List   Diagnosis Date Noted  . Respiratory distress 02/16/2016  . Essential hypertension  02/16/2016  . CHF (congestive heart failure) (HCC) 11/26/2015    LABS    Component Value Date/Time   NA 136 02/02/2016 1941   K 3.6 02/02/2016 1941   CL 103 02/02/2016 1941   CO2 26 02/02/2016 1941   GLUCOSE 100* 02/02/2016 1941   BUN 12 02/02/2016 1941   CREATININE 0.76 02/02/2016 1941   CALCIUM 8.5* 02/02/2016 1941   GFRNONAA >60 02/02/2016 1941   GFRAA >60 02/02/2016 1941   CMP     Component Value Date/Time   NA 136 02/02/2016 1941   K 3.6 02/02/2016 1941   CL 103 02/02/2016 1941   CO2 26 02/02/2016 1941   GLUCOSE 100* 02/02/2016 1941   BUN 12 02/02/2016 1941   CREATININE 0.76 02/02/2016 1941   CALCIUM 8.5* 02/02/2016 1941   PROT 7.9 02/02/2016 1941   ALBUMIN 4.2 02/02/2016 1941   AST 18 02/02/2016 1941   ALT 13* 02/02/2016 1941   ALKPHOS 62 02/02/2016 1941   BILITOT 0.2* 02/02/2016 1941   GFRNONAA >60 02/02/2016 1941   GFRAA >60 02/02/2016 1941       Component Value Date/Time   WBC 9.8  02/02/2016 1941   HGB 10.9* 02/02/2016 1941   HCT 34.0* 02/02/2016 1941   MCV 80.6 02/02/2016 1941    Lipid Panel  No results found for: CHOL, TRIG, HDL, CHOLHDL, VLDL, LDLCALC, LDLDIRECT  ABG No results found for: PHART, PCO2ART, PO2ART, HCO3, TCO2, ACIDBASEDEF, O2SAT   No results found for: TSH BNP (last 3 results) No results for input(s): BNP in the last 8760 hours.  ProBNP (last 3 results) No results for input(s): PROBNP in the last 8760 hours.  Cardiac Panel (last 3 results) No results for input(s): CKTOTAL, CKMB, TROPONINI, RELINDX in the last 72 hours.  Iron/TIBC/Ferritin/ %Sat No results found for: IRON, TIBC, FERRITIN, IRONPCTSAT   EKG Orders placed or performed during the hospital encounter of 02/16/16  . ED EKG  . ED EKG  . EKG 12-Lead  . EKG 12-Lead  . EKG     Prior Assessment and Plan Problem List as of 02/24/2016      Cardiovascular and Mediastinum   CHF (congestive heart failure) [facility](HCC)   Last Assessment & Plan 11/26/2015 Erroneous Encounter  Written 11/26/2015  2:40 PM by Dyann KiefMichele M Lenze, PA-C    No show      Essential hypertension     Other   Respiratory distress       Imaging: Dg Chest 2 View  02/16/2016  CLINICAL DATA:  Shortness of breath EXAM: CHEST  2 VIEW COMPARISON:  Feb 02, 2016 FINDINGS: No pneumothorax. The patient is status post aortic valve replacement. Mild cardiomegaly. Mild edema. No other acute abnormalities. Probable minimal fluid in the right fissure. IMPRESSION: Cardiomegaly and mild edema. Electronically Signed   By: Gerome Samavid  Williams III M.D   On: 02/16/2016 15:38   Dg Chest 2 View  02/02/2016  CLINICAL DATA:  Left-sided chest pain since last night. EXAM: CHEST  2 VIEW COMPARISON:  11/20/2015 FINDINGS: Sternotomy wires prosthetic mitral valve are present. Lungs are adequately inflated and demonstrate mild prominence of the perihilar markings suggesting minimal vascular congestion. Mild stable cardiomegaly. Remainder of the exam is unchanged. IMPRESSION: Mild cardiomegaly with minimal vascular congestion. Electronically Signed   By: Elberta Fortisaniel  Boyle M.D.   On: 02/02/2016 23:51

## 2016-02-24 NOTE — Patient Instructions (Signed)

## 2016-02-24 NOTE — Progress Notes (Signed)
Cardiology Office Note   Date:  02/24/2016   ID:  ZARRIAH STARKEL, DOB 08-30-1981, MRN [id number]  PCP:  Jerrell Belfast, [name]  Cardiologist: Inis Sizer, NP   No chief complaint on file.     History of Present Illness: Theresa Crawford is a 35 y.o. female who presents for ongoing assessment and management chronic diastolic CHF Hypertension, with hx of mitral valve repair. She was last seen by Dr. Purvis Sheffield on 12/05/2015 after admission to [facility] for decompensated CHF. She was recommended for sleep study due to daytime somnolence and morning headaches.  On last office visit she was sent to ER after experiencing Asthma attack, respiratory distress and anxiety. Unable to talk with some upper airway raspy breathing. She denies chest pain, but is having significant issues with inhaling. She feels constricted in her upper throat. She did not stay in ER and left because she was feeling better.   She is here today feeling much better. She's had no further recurrence. She states that she has anxiety attacks usually causing throat constriction. She has been out of her Seroquel and other anti-anxiety medications. She is due to followup with the health department in 3 days for refills. She denies any more shortness of breath or dizziness. Blood pressure is well-controlled.  Past Medical History  Diagnosis Date  . Mitral valve disease     mitral regurgitation  . CHF (congestive heart failure) (HCC)   . Hypertension   . History of open heart surgery   . Congestive heart failure [facility])     Past Surgical History  Procedure Laterality Date  . Mitral valve repair    . Partial hysterectomy  2011    PID w/problem with fallopian tubes  . Cesarean section      X's 2  . Abdominal hysterectomy    . Open heart surgery       Current Outpatient Prescriptions  Medication Sig Dispense Refill  . amLODipine (NORVASC) 5 MG tablet Take 5 mg by mouth daily.  3  . bumetanide (BUMEX) 1 MG  tablet Take 1 mg by mouth 2 (two) times daily.  3  . citalopram (CELEXA) 40 MG tablet Take 40 mg by mouth daily.    . clopidogrel (PLAVIX) 75 MG tablet Take 75 mg by mouth daily.  3  . ferrous sulfate 325 (65 FE) MG tablet Take 325 mg by mouth 2 (two) times daily with a meal.    . losartan (COZAAR) 50 MG tablet Take 50 mg by mouth 2 (two) times daily.  3  . metoprolol succinate (TOPROL-XL) 50 MG 24 hr tablet Take 50 mg by mouth daily.  3  . potassium chloride SA (K-DUR,KLOR-CON) 20 MEQ tablet Take 20 mEq by mouth 2 (two) times daily.  3  . QUEtiapine (SEROQUEL) 25 MG tablet Take 25 mg by mouth 2 (two) times daily.    Marland Kitchen spironolactone (ALDACTONE) 25 MG tablet Take 25 mg by mouth daily.  3  . STOOL SOFTENER 100 MG capsule Take 100 mg by mouth daily.  3   No current facility-administered medications for this visit.    Allergies:   Lisinopril; Penicillins; Bee venom; Contrast media; and Shellfish allergy    Social History:  The patient  reports that she has been smoking Cigarettes.  She started smoking about 16 years ago. She has been smoking about 0.25 packs per day. She has never used smokeless tobacco. She reports that she does not drink alcohol or use illicit  drugs.   Family History:  The patient's family history includes Heart disease in her paternal grandfather; Heart failure in her father.    ROS: All other systems are reviewed and negative. Unless otherwise mentioned in H&P    PHYSICAL EXAM: VS:  BP 124/72 mmHg  Pulse 91  Ht 5\' 4"  (1.626 m)  Wt 225 lb (102.059 kg)  BMI 38.60 kg/m2  SpO2 97%  LMP 02/16/2016 , BMI Body mass index is 38.6 kg/(m^2). GEN: Well nourished, well developed, in no acute distress HEENT: normal Neck: no JVD, carotid bruits, or masses Cardiac:RRR; 1/6 systolic murmur. rubs, or gallops,no edema  Respiratory:  clear to auscultation bilaterally, normal work of breathing GI: soft, nontender, nondistended, + BS MS: no deformity or atrophy Skin: warm and  dry, no rash Neuro:  Strength and sensation are intact Psych: euthymic mood, full affect   Recent Labs: 02/02/2016: ALT 13*; BUN 12; Creatinine, Ser 0.76; Hemoglobin 10.9*; Platelets 303; Potassium 3.6; Sodium 136    Lipid Panel No results found for: CHOL, TRIG, HDL, CHOLHDL, VLDL, LDLCALC, LDLDIRECT    Wt Readings from Last 3 Encounters:  02/24/16 225 lb (102.059 kg)  02/16/16 229 lb (103.874 kg)  02/16/16 230 lb (104.327 kg)    ASSESSMENT AND PLAN:  1. Hypertension: Blood pressure is well-controlled on current medication. Will not make any changes at this time.most recent echocardiogram was completed a [facility] in November 2016. The patient had normal LV systolic function, no wall motion abnormalities, EF was 55-60%.Microvel leaflet calcification it was visualized without evidence of mitral valve prolapse. There was mild mitral regurg. No systolic anterior motion was visualized.  2. Respiratory distress:no further incidences of choking or dyspnea. She related this to anxiety attack. She had run out of her Celexa and Seroquel, and is due to see the health department for refills this week.     Current medicines are reviewed at length with the patient today.    Labs/ tests ordered today include:  No orders of the defined types were placed in this encounter.     Disposition:   FU with Dr. Purvis SheffieldKoneswaran in 6 months.   Signed, Joni ReiningKathryn Antony Sian, NP  02/24/2016 2:43 PM    [facility] [address], [place], [place] [address] Phone: [phone]; Fax: [phone]

## 2016-04-26 ENCOUNTER — Encounter (HOSPITAL_COMMUNITY): Payer: Self-pay | Admitting: Emergency Medicine

## 2016-04-26 ENCOUNTER — Emergency Department (HOSPITAL_COMMUNITY): Payer: Medicaid Other

## 2016-04-26 ENCOUNTER — Emergency Department (HOSPITAL_COMMUNITY)
Admission: EM | Admit: 2016-04-26 | Discharge: 2016-04-26 | Disposition: A | Discharge status: Home / Self Care | Payer: Medicaid Other | Attending: Emergency Medicine | Admitting: Emergency Medicine

## 2016-04-26 DIAGNOSIS — F17210 Nicotine dependence, cigarettes, uncomplicated: Secondary | ICD-10-CM | POA: Diagnosis not present

## 2016-04-26 DIAGNOSIS — I509 Heart failure, unspecified: Secondary | ICD-10-CM | POA: Diagnosis not present

## 2016-04-26 DIAGNOSIS — I110 Hypertensive heart disease with heart failure: Secondary | ICD-10-CM | POA: Insufficient documentation

## 2016-04-26 DIAGNOSIS — R42 Dizziness and giddiness: Secondary | ICD-10-CM | POA: Insufficient documentation

## 2016-04-26 DIAGNOSIS — Z79899 Other long term (current) drug therapy: Secondary | ICD-10-CM | POA: Diagnosis not present

## 2016-04-26 LAB — CBC WITH DIFFERENTIAL/PLATELET
Basophils Absolute: 0 10*3/uL (ref 0.0–0.1)
Basophils Relative: 0 %
EOS PCT: 2 %
Eosinophils Absolute: 0.2 10*3/uL (ref 0.0–0.7)
HCT: 34.4 % — ABNORMAL LOW (ref 36.0–46.0)
HEMOGLOBIN: 11.1 g/dL — AB (ref 12.0–15.0)
LYMPHS ABS: 2 10*3/uL (ref 0.7–4.0)
LYMPHS PCT: 22 %
MCH: 25.8 pg — AB (ref 26.0–34.0)
MCHC: 32.3 g/dL (ref 30.0–36.0)
MCV: 80 fL (ref 78.0–100.0)
Monocytes Absolute: 0.5 10*3/uL (ref 0.1–1.0)
Monocytes Relative: 6 %
NEUTROS PCT: 70 %
Neutro Abs: 6.5 10*3/uL (ref 1.7–7.7)
Platelets: 349 10*3/uL (ref 150–400)
RBC: 4.3 MIL/uL (ref 3.87–5.11)
RDW: 16.2 % — ABNORMAL HIGH (ref 11.5–15.5)
WBC: 9.2 10*3/uL (ref 4.0–10.5)

## 2016-04-26 LAB — COMPREHENSIVE METABOLIC PANEL
ALT: 10 U/L — AB (ref 14–54)
AST: 17 U/L (ref 15–41)
Albumin: 3.9 g/dL (ref 3.5–5.0)
Alkaline Phosphatase: 55 U/L (ref 38–126)
Anion gap: 3 — ABNORMAL LOW (ref 5–15)
BUN: 10 mg/dL (ref 6–20)
CALCIUM: 8.4 mg/dL — AB (ref 8.9–10.3)
CO2: 29 mmol/L (ref 22–32)
CREATININE: 0.77 mg/dL (ref 0.44–1.00)
Chloride: 104 mmol/L (ref 101–111)
GFR calc non Af Amer: >60 mL/min (ref >60)
Glucose, Bld: 127 mg/dL — ABNORMAL HIGH (ref 65–99)
Potassium: 3.1 mmol/L — ABNORMAL LOW (ref 3.5–5.1)
Sodium: 136 mmol/L (ref 135–145)
Total Bilirubin: 0.3 mg/dL (ref 0.3–1.2)
Total Protein: 7.1 g/dL (ref 6.5–8.1)

## 2016-04-26 LAB — TROPONIN I

## 2016-04-26 LAB — BRAIN NATRIURETIC PEPTIDE: B NATRIURETIC PEPTIDE 5: 99 pg/mL (ref 0.0–100.0)

## 2016-04-26 MED ORDER — MECLIZINE HCL 25 MG PO TABS: 25.0000 mg | ORAL_TABLET | Freq: Three times a day (TID) | ORAL | 0 refills | Status: DC | PRN

## 2016-04-26 MED ORDER — MECLIZINE HCL 12.5 MG PO TABS
25.0000 mg | ORAL_TABLET | Freq: Once | ORAL | Status: AC
  Administered 2016-04-26: 25 mg via ORAL
  Filled 2016-04-26: qty 2

## 2016-04-26 MED ORDER — SODIUM CHLORIDE 0.9 % IV BOLUS (SEPSIS)
1000.0000 mL | Freq: Once | INTRAVENOUS | Status: AC
  Administered 2016-04-26: 1000 mL via INTRAVENOUS

## 2016-04-26 NOTE — ED Notes (Signed)
Patient to radiology.

## 2016-04-26 NOTE — ED Notes (Signed)
Patient verbalizes understanding of discharge instructions, prescriptions, home care and follow up care. Patient out of department at this time. 

## 2016-04-26 NOTE — ED Triage Notes (Signed)
PT c/o central chest pressure with right arm pain and dizziness that started about an hour ago while walking from her car into a store. PT states history of mitral valve repair in 2014.

## 2016-04-26 NOTE — Discharge Instructions (Signed)
Follow up with your md this week for recheck  °

## 2016-04-27 NOTE — ED Provider Notes (Signed)
[facility] DEPT Provider Note   CSN: [id number] Arrival date & time: 04/26/16  1700     History   Chief Complaint Chief Complaint  Patient presents with  . Chest Pain    HPI Theresa Crawford is a 35 y.o. female.  Patient states that she's had some dizziness with the room spinning. Patient states it is improving somewhat now    Dizziness  Quality:  Head spinning Severity:  Moderate Onset quality:  Sudden Timing:  Intermittent Progression:  Partially resolved Chronicity:  New Context: bending over   Relieved by:  Nothing Worsened by:  Nothing Associated symptoms: no chest pain, no diarrhea and no headaches     Past Medical History:  Diagnosis Date  . CHF (congestive heart failure) (HCC)   . Congestive heart failure (HCC)   . History of open heart surgery   . Hypertension   . Mitral valve disease    mitral regurgitation    Patient Active Problem List   Diagnosis Date Noted  . Respiratory distress 02/16/2016  . Essential hypertension 02/16/2016  . CHF (congestive heart failure) (HCC) 11/26/2015    Past Surgical History:  Procedure Laterality Date  . ABDOMINAL HYSTERECTOMY    . CESAREAN SECTION     X's 2  . MITRAL VALVE REPAIR    . open heart surgery    . PARTIAL HYSTERECTOMY  2011   PID w/problem with fallopian tubes    OB History    No data available       Home Medications    Prior to Admission medications   Medication Sig Start Date End Date Taking? Authorizing Provider  amLODipine (NORVASC) 5 MG tablet Take 5 mg by mouth daily. 10/29/15  Yes Historical Provider, MD  bumetanide (BUMEX) 1 MG tablet Take 1 mg by mouth 2 (two) times daily. 10/31/15  Yes Historical Provider, MD  citalopram (CELEXA) 40 MG tablet Take 40 mg by mouth 2 (two) times daily.    Yes Historical Provider, MD  clopidogrel (PLAVIX) 75 MG tablet Take 75 mg by mouth daily. 10/29/15  Yes Historical Provider, MD  ferrous sulfate 325 (65 FE) MG tablet Take 325 mg by mouth 2 (two)  times daily with a meal.   Yes Historical Provider, MD  losartan (COZAAR) 50 MG tablet Take 50 mg by mouth 2 (two) times daily. 10/29/15  Yes Historical Provider, MD  metoprolol succinate (TOPROL-XL) 50 MG 24 hr tablet Take 50 mg by mouth daily. 10/29/15  Yes Historical Provider, MD  potassium chloride SA (K-DUR,KLOR-CON) 20 MEQ tablet Take 20 mEq by mouth 2 (two) times daily. 10/29/15  Yes Historical Provider, MD  QUEtiapine (SEROQUEL) 25 MG tablet Take 25 mg by mouth 2 (two) times daily.   Yes Historical Provider, MD  spironolactone (ALDACTONE) 25 MG tablet Take 25 mg by mouth daily. 10/29/15  Yes Historical Provider, MD  STOOL SOFTENER 100 MG capsule Take 100 mg by mouth daily. 10/29/15  Yes Historical Provider, MD  meclizine (ANTIVERT) 25 MG tablet Take 1 tablet (25 mg total) by mouth 3 (three) times daily as needed for dizziness. 04/26/16   Bethann BerkshireJoseph Dollie Mayse, MD    Family History Family History  Problem Relation Age of Onset  . Heart failure Father     just received LVAD  . Heart disease Paternal Grandfather     stent placement    Social History Social History  Substance Use Topics  . Smoking status: Current Every Day Smoker    Packs/day: 0.25  Types: Cigarettes    Start date: 11/03/1999  . Smokeless tobacco: Never Used  . Alcohol use No     Allergies   Lisinopril; Penicillins; Bee venom; Contrast media [iodinated diagnostic agents]; and Shellfish allergy   Review of Systems Review of Systems  Constitutional: Negative for appetite change and fatigue.  HENT: Negative for congestion, ear discharge and sinus pressure.   Eyes: Negative for discharge.  Respiratory: Negative for cough.   Cardiovascular: Negative for chest pain.  Gastrointestinal: Negative for abdominal pain and diarrhea.  Genitourinary: Negative for frequency and hematuria.  Musculoskeletal: Negative for back pain.  Skin: Negative for rash.  Neurological: Positive for dizziness. Negative for seizures and  headaches.  Psychiatric/Behavioral: Negative for hallucinations.     Physical Exam Updated Vital Signs BP 134/78 (BP Location: Left Arm)   Pulse 78   Temp 98 F (36.7 C) (Oral)   Resp 16   Ht 5\' 4"  (1.626 m)   Wt 220 lb (99.8 kg)   LMP 04/19/2016   SpO2 100%   BMI 37.76 kg/m   Physical Exam  Constitutional: She is oriented to person, place, and time. She appears well-developed.  HENT:  Head: Normocephalic.  Patient shows no nystagmus but she does complain of dizziness when she moves around  Eyes: Conjunctivae and EOM are normal. No scleral icterus.  Neck: Neck supple. No thyromegaly present.  Cardiovascular: Normal rate and regular rhythm.  Exam reveals no gallop and no friction rub.   No murmur heard. Pulmonary/Chest: No stridor. She has no wheezes. She has no rales. She exhibits no tenderness.  Abdominal: She exhibits no distension. There is no tenderness. There is no rebound.  Musculoskeletal: Normal range of motion. She exhibits no edema.  Lymphadenopathy:    She has no cervical adenopathy.  Neurological: She is oriented to person, place, and time. She exhibits normal muscle tone. Coordination normal.  Skin: No rash noted. No erythema.  Psychiatric: She has a normal mood and affect. Her behavior is normal.     ED Treatments / Results  Labs (all labs ordered are listed, but only abnormal results are displayed) Labs Reviewed  CBC WITH DIFFERENTIAL/PLATELET - Abnormal; Notable for the following:       Result Value   Hemoglobin 11.1 (*)    HCT 34.4 (*)    MCH 25.8 (*)    RDW 16.2 (*)    All other components within normal limits  COMPREHENSIVE METABOLIC PANEL - Abnormal; Notable for the following:    Potassium 3.1 (*)    Glucose, Bld 127 (*)    Calcium 8.4 (*)    ALT 10 (*)    Anion gap 3 (*)    All other components within normal limits  TROPONIN I  BRAIN NATRIURETIC PEPTIDE    EKG  EKG Interpretation  Date/Time:  Monday April 26 2016 17:42:45  EDT Ventricular Rate:  79 PR Interval:  162 QRS Duration: 78 QT Interval:  404 QTC Calculation: 463 R Axis:   46 Text Interpretation:  Normal sinus rhythm Possible Left atrial enlargement Borderline ECG Confirmed by Shaelyn Decarli  MD, Quana Chamberlain [phone]) on 04/26/2016 6:56:30 PM       Radiology Dg Chest 2 View  Result Date: 04/26/2016 CLINICAL DATA:  Right arm pain.  Dizziness.  Heavy feeling in chest. EXAM: CHEST  2 VIEW COMPARISON:  Two-view chest x-ray 02/16/2016 FINDINGS: Median sternotomy for mitral valve repair is again noted. The heart is mildly enlarged. Mild edema is similar the prior study. No focal  airspace consolidation is present. Small bilateral pleural effusions are noted. The visualized soft tissues and bony thorax are otherwise unremarkable. IMPRESSION: 1. Cardiomegaly with mild edema and small effusions suggesting congestive heart failure. 2. No focal airspace consolidation. Electronically Signed   By: Marin Robertshristopher  Mattern M.D.   On: 04/26/2016 18:50   Ct Head Wo Contrast  Result Date: 04/26/2016 CLINICAL DATA:  Acute onset of dizziness and central chest pressure. Initial encounter. EXAM: CT HEAD WITHOUT CONTRAST TECHNIQUE: Contiguous axial images were obtained from the base of the skull through the vertex without intravenous contrast. COMPARISON:  None. FINDINGS: There is no evidence of acute infarction, mass lesion, or intra- or extra-axial hemorrhage on CT. The posterior fossa, including the cerebellum, brainstem and fourth ventricle, is within normal limits. The third and lateral ventricles, and basal ganglia are unremarkable in appearance. The cerebral hemispheres are symmetric in appearance, with normal gray-white differentiation. No mass effect or midline shift is seen. There is no evidence of fracture; visualized osseous structures are unremarkable in appearance. The visualized portions of the orbits are within normal limits. The paranasal sinuses and mastoid air cells are  well-aerated. No significant soft tissue abnormalities are seen. IMPRESSION: Unremarkable noncontrast CT of the head. Electronically Signed   By: Roanna RaiderJeffery  Chang M.D.   On: 04/26/2016 21:30    Procedures Procedures (including critical care time)  Medications Ordered in ED Medications  sodium chloride 0.9 % bolus 1,000 mL (0 mLs Intravenous Stopped 04/26/16 2222)  meclizine (ANTIVERT) tablet 25 mg (25 mg Oral Given 04/26/16 2022)     Initial Impression / Assessment and Plan / ED Course  I have reviewed the triage vital signs and the nursing notes.  Pertinent labs & imaging results that were available during my care of the patient were reviewed by me and considered in my medical decision making (see chart for details).  Clinical Course    Labs and CT scan of the head unremarkable patient improved with Antivert. Patient will be prescribed Antivert will follow-up with PCP  Final Clinical Impressions(s) / ED Diagnoses   Final diagnoses:  Vertigo    New Prescriptions Discharge Medication List as of 04/26/2016  9:45 PM    START taking these medications   Details  meclizine (ANTIVERT) 25 MG tablet Take 1 tablet (25 mg total) by mouth 3 (three) times daily as needed for dizziness., Starting Mon 04/26/2016, Print         Bethann BerkshireJoseph Brendi Mccarroll, MD 04/27/16 [phone]

## 2016-04-30 ENCOUNTER — Emergency Department (HOSPITAL_COMMUNITY)
Admission: EM | Admit: 2016-04-30 | Discharge: 2016-04-30 | Disposition: A | Discharge status: Home / Self Care | Payer: Medicaid Other | Attending: Emergency Medicine | Admitting: Emergency Medicine

## 2016-04-30 ENCOUNTER — Encounter (HOSPITAL_COMMUNITY): Payer: Self-pay

## 2016-04-30 DIAGNOSIS — Z79899 Other long term (current) drug therapy: Secondary | ICD-10-CM | POA: Diagnosis not present

## 2016-04-30 DIAGNOSIS — M545 Low back pain: Secondary | ICD-10-CM | POA: Diagnosis present

## 2016-04-30 DIAGNOSIS — M5431 Sciatica, right side: Secondary | ICD-10-CM | POA: Diagnosis not present

## 2016-04-30 DIAGNOSIS — I509 Heart failure, unspecified: Secondary | ICD-10-CM | POA: Insufficient documentation

## 2016-04-30 DIAGNOSIS — M5441 Lumbago with sciatica, right side: Secondary | ICD-10-CM

## 2016-04-30 DIAGNOSIS — F17210 Nicotine dependence, cigarettes, uncomplicated: Secondary | ICD-10-CM | POA: Diagnosis not present

## 2016-04-30 DIAGNOSIS — I110 Hypertensive heart disease with heart failure: Secondary | ICD-10-CM | POA: Insufficient documentation

## 2016-04-30 MED ORDER — KETOROLAC TROMETHAMINE 60 MG/2ML IM SOLN
60.0000 mg | Freq: Once | INTRAMUSCULAR | Status: AC
  Administered 2016-04-30: 60 mg via INTRAMUSCULAR
  Filled 2016-04-30: qty 2

## 2016-04-30 MED ORDER — DIAZEPAM 5 MG/ML IJ SOLN
10.0000 mg | Freq: Once | INTRAMUSCULAR | Status: AC
  Administered 2016-04-30: 10 mg via INTRAMUSCULAR
  Filled 2016-04-30: qty 2

## 2016-04-30 MED ORDER — PREDNISONE 20 MG PO TABS: ORAL_TABLET | ORAL | 0 refills | Status: DC

## 2016-04-30 MED ORDER — CYCLOBENZAPRINE HCL 10 MG PO TABS: 10.0000 mg | ORAL_TABLET | Freq: Three times a day (TID) | ORAL | 0 refills | Status: DC | PRN

## 2016-04-30 MED ORDER — TRAMADOL HCL 50 MG PO TABS
100.0000 mg | ORAL_TABLET | Freq: Four times a day (QID) | ORAL | 0 refills | Status: DC | PRN
Start: 2016-04-30 — End: 2016-05-10

## 2016-04-30 NOTE — ED Notes (Signed)
Patient verbalizes understanding of discharge instructions, prescriptions, home care and follow up care. Patient out of department at this time. 

## 2016-04-30 NOTE — ED Triage Notes (Signed)
Pt states she was "just moving some things around the house"  When she felt a sudden pulling/pain to her right lower back and now with shooting pain down right leg.

## 2016-04-30 NOTE — ED Provider Notes (Signed)
[facility] Provider Note   CSN: [id number] Arrival date & time: 04/30/16  0017  By signing my name below, I, Phillis HaggisGabriella Gaje, attest that this documentation has been prepared under the direction and in the presence of Devoria AlbeIva Haly Feher, MD. Electronically Signed: Phillis HaggisGabriella Gaje, ED Scribe. 04/30/16. 12:53 AM.  Time seen 12:44 AM  History   Chief Complaint Chief Complaint  Patient presents with  . Back Pain   The history is provided by the patient. No language interpreter was used.  HPI Comments: Theresa Crawford is a 35 y.o. female with a hx of CHF, open heart surgery, HTN, and MVR who presents to the Emergency Department complaining of gradually worsening, throbbing right lower back pain that radiates down the posterior right leg onset earlier this evening. She states that she was moving things around the house cleaning this afternoon when she began to feel a pulling sensation in her right lower back. She states that the pain worsened while she was at church in choir practice and it is a shooting pain to the posterior leg right above the knee. The back pain is described as throbbing now. She reports worsening pain with any sort of movement. She has tried stretches and Tylenol for her pain to no relief. She reports hx of similar symptoms 1.5 years ago for which she received physical therapy. She denies numbness, weakness, bladder or bowel incontinence. Pt is a smoker, 6-7 cigarettes a day. She denies use of alcohol.   PCP: Jerrell BelfastHouse, [name], [name]  Past Medical History:  Diagnosis Date  . CHF (congestive heart failure) (HCC)   . Congestive heart failure (HCC)   . History of open heart surgery   . Hypertension   . Mitral valve disease    mitral regurgitation    Patient Active Problem List   Diagnosis Date Noted  . Respiratory distress 02/16/2016  . Essential hypertension 02/16/2016  . CHF (congestive heart failure) (HCC) 11/26/2015    Past Surgical History:  Procedure Laterality Date    . ABDOMINAL HYSTERECTOMY    . CESAREAN SECTION     X's 2  . MITRAL VALVE REPAIR    . open heart surgery    . PARTIAL HYSTERECTOMY  2011   PID w/problem with fallopian tubes    OB History    No data available       Home Medications    Prior to Admission medications   Medication Sig Start Date End Date Taking? Authorizing Provider  amLODipine (NORVASC) 5 MG tablet Take 5 mg by mouth daily. 10/29/15  Yes Historical Provider, MD  bumetanide (BUMEX) 1 MG tablet Take 1 mg by mouth 2 (two) times daily. 10/31/15  Yes Historical Provider, MD  citalopram (CELEXA) 40 MG tablet Take 40 mg by mouth 2 (two) times daily.    Yes Historical Provider, MD  clopidogrel (PLAVIX) 75 MG tablet Take 75 mg by mouth daily. 10/29/15  Yes Historical Provider, MD  ferrous sulfate 325 (65 FE) MG tablet Take 325 mg by mouth 2 (two) times daily with a meal.   Yes Historical Provider, MD  losartan (COZAAR) 50 MG tablet Take 50 mg by mouth 2 (two) times daily. 10/29/15  Yes Historical Provider, MD  metoprolol succinate (TOPROL-XL) 50 MG 24 hr tablet Take 50 mg by mouth daily. 10/29/15  Yes Historical Provider, MD  potassium chloride SA (K-DUR,KLOR-CON) 20 MEQ tablet Take 20 mEq by mouth 2 (two) times daily. 10/29/15  Yes Historical Provider, MD  QUEtiapine (SEROQUEL) 25  MG tablet Take 25 mg by mouth 2 (two) times daily.   Yes Historical Provider, MD  spironolactone (ALDACTONE) 25 MG tablet Take 25 mg by mouth daily. 10/29/15  Yes Historical Provider, MD  STOOL SOFTENER 100 MG capsule Take 100 mg by mouth daily. 10/29/15  Yes Historical Provider, MD  cyclobenzaprine (FLEXERIL) 10 MG tablet Take 1 tablet (10 mg total) by mouth 3 (three) times daily as needed (muscle soreness). 04/30/16   Devoria Albe, MD  meclizine (ANTIVERT) 25 MG tablet Take 1 tablet (25 mg total) by mouth 3 (three) times daily as needed for dizziness. 04/26/16   Bethann Berkshire, MD  predniSONE (DELTASONE) 20 MG tablet Take 3 po QD x 3d , then 2 po QD x 3d then 1  po QD x 3d 04/30/16   Devoria Albe, MD  traMADol (ULTRAM) 50 MG tablet Take 2 tablets (100 mg total) by mouth every 6 (six) hours as needed. 04/30/16   Devoria Albe, MD    Family History Family History  Problem Relation Age of Onset  . Heart failure Father     just received LVAD  . Heart disease Paternal Grandfather     stent placement    Social History Social History  Substance Use Topics  . Smoking status: Current Every Day Smoker    Packs/day: 0.25    Types: Cigarettes    Start date: 11/03/1999  . Smokeless tobacco: Never Used  . Alcohol use No  unemployed Smokes 6 cigs a day   Allergies   Lisinopril; Penicillins; Bee venom; Contrast media [iodinated diagnostic agents]; and Shellfish allergy   Review of Systems Review of Systems  Musculoskeletal: Positive for back pain.  Neurological: Negative for weakness and numbness.  All other systems reviewed and are negative.    Physical Exam Updated Vital Signs BP (!) 105/52 (BP Location: Left Arm)   Pulse 84   Temp 98.3 F (36.8 C) (Oral)   Resp 18   Ht 5\' 4"  (1.626 m)   Wt 225 lb (102.1 kg)   LMP 04/19/2016   SpO2 100%   BMI 38.62 kg/m   Physical Exam  Constitutional: She is oriented to person, place, and time. She appears well-developed and well-nourished.  Non-toxic appearance. She does not appear ill. No distress.  HENT:  Head: Normocephalic and atraumatic.  Right Ear: External ear normal.  Left Ear: External ear normal.  Nose: Nose normal. No mucosal edema or rhinorrhea.  Mouth/Throat: Mucous membranes are normal. No dental abscesses or uvula swelling.  Eyes: Conjunctivae and EOM are normal.  Neck: Normal range of motion and full passive range of motion without pain.  Cardiovascular: Normal rate.   Pulmonary/Chest: She has no rhonchi. She exhibits no crepitus.  Abdominal: Normal appearance.  Musculoskeletal: Normal range of motion. She exhibits tenderness. She exhibits no edema.       Back:  Tender diffusely  over lumber spine and very tender over right SIJ. Nontender over left SIJ. Patellar reflexes 1 + and equal, + SLR R> L  Neurological: She is alert and oriented to person, place, and time. She has normal strength. No cranial nerve deficit.  Skin: Skin is warm, dry and intact. No rash noted. No erythema. No pallor.  Psychiatric: She has a normal mood and affect. Her speech is normal and behavior is normal. Her mood appears not anxious.  Nursing note and vitals reviewed.    ED Treatments / Results  DIAGNOSTIC STUDIES: Oxygen Saturation is 100% on RA, normal by my interpretation.  Procedures Procedures (including critical care time)  Medications Ordered in ED Medications  ketorolac (TORADOL) injection 60 mg (60 mg Intramuscular Given 04/30/16 0103)  diazepam (VALIUM) injection 10 mg (10 mg Intramuscular Given 04/30/16 0103)     Initial Impression / Assessment and Plan / ED Course    I have reviewed the triage vital signs and the nursing notes.  Pertinent labs & imaging results that were available during my care of the patient were reviewed by me and considered in my medical decision making (see chart for details).  Clinical Course   12:46 AM-Discussed treatment plan which includes Toradol and Valium IM  with pt at bedside and pt agreed to plan.   She was advised to use ice and heat to her back, prescribed steroids, muscle relaxer and tramadol.    Review of the [place] database shows no injuries for the past 6 months.  Final Clinical Impressions(s) / ED Diagnoses   Final diagnoses:  Low back pain with radiation, right  Sciatica of right side    New Prescriptions Discharge Medication List as of 04/30/2016  1:28 AM    START taking these medications   Details  cyclobenzaprine (FLEXERIL) 10 MG tablet Take 1 tablet (10 mg total) by mouth 3 (three) times daily as needed (muscle soreness)., Starting Fri 04/30/2016, Print    predniSONE (DELTASONE) 20 MG tablet Take 3 po  QD x 3d , then 2 po QD x 3d then 1 po QD x 3d, Print    traMADol (ULTRAM) 50 MG tablet Take 2 tablets (100 mg total) by mouth every 6 (six) hours as needed., Starting Fri 04/30/2016, Print       Plan discharge  Devoria AlbeIva Marytza Grandpre, MD, FACEP   I personally performed the services described in this documentation, which was scribed in my presence. The recorded information has been reviewed and considered.  Devoria AlbeIva Arvle Grabe, MD, Concha PyoFACEP    Hoover Grewe, MD 04/30/16 [phone]

## 2016-04-30 NOTE — Discharge Instructions (Signed)
Use ice and heat on your back for comfort. Take the medication as prescribed with acetaminophen 1000 mg 4 times a day. Return to the emergency department for any of the warning signs in the discharge papers. Have you Dr. recheck you if you aren't improving in the next week.

## 2016-05-06 ENCOUNTER — Telehealth: Payer: Self-pay | Admitting: *Deleted

## 2016-05-06 NOTE — Telephone Encounter (Signed)
No need for follow up appointment for headaches. Needs to take her medications as directed. Thank you for having pharmacy become involved to help with cost of amlodipine. IF she still cannot afford will need follow to discuss other options.  Thank you

## 2016-05-06 NOTE — Telephone Encounter (Signed)
Heather from [facility] community care nurse, says she had home visit with pt 8/22. Says pt has been having headaches off and on for the last few weeks and has stopped taking Amlodipine due to cost - Herbert SetaHeather has been in contact with pharmacy to help pt with cost - says she wanted to make Dr. Purvis SheffieldKoneswaran aware of headaches and if pt needed sooner f/u. Heather say spt has no other symptoms than back pain for which she was recently in ED for. Will forward to Joni ReiningKathryn Lawrence, NP with Dr. Purvis SheffieldKoneswaran out of office.

## 2016-05-06 NOTE — Telephone Encounter (Signed)
I spoke with heather who is working with pt through [facility]

## 2016-05-09 ENCOUNTER — Encounter (HOSPITAL_COMMUNITY): Payer: Self-pay

## 2016-05-09 ENCOUNTER — Emergency Department (HOSPITAL_COMMUNITY): Payer: Medicaid Other

## 2016-05-09 ENCOUNTER — Emergency Department (HOSPITAL_COMMUNITY)
Admission: EM | Admit: 2016-05-09 | Discharge: 2016-05-10 | Disposition: A | Discharge status: Home / Self Care | Payer: Medicaid Other | Attending: Emergency Medicine | Admitting: Emergency Medicine

## 2016-05-09 DIAGNOSIS — M545 Low back pain: Secondary | ICD-10-CM | POA: Diagnosis present

## 2016-05-09 DIAGNOSIS — M5136 Other intervertebral disc degeneration, lumbar region: Secondary | ICD-10-CM | POA: Diagnosis not present

## 2016-05-09 DIAGNOSIS — I110 Hypertensive heart disease with heart failure: Secondary | ICD-10-CM | POA: Diagnosis not present

## 2016-05-09 DIAGNOSIS — F17210 Nicotine dependence, cigarettes, uncomplicated: Secondary | ICD-10-CM | POA: Diagnosis not present

## 2016-05-09 DIAGNOSIS — I509 Heart failure, unspecified: Secondary | ICD-10-CM | POA: Insufficient documentation

## 2016-05-09 MED ORDER — HYDROCODONE-ACETAMINOPHEN 5-325 MG PO TABS
2.0000 | ORAL_TABLET | Freq: Once | ORAL | Status: AC
  Administered 2016-05-09: 2 via ORAL
  Filled 2016-05-09: qty 2

## 2016-05-09 MED ORDER — KETOROLAC TROMETHAMINE 10 MG PO TABS
10.0000 mg | ORAL_TABLET | Freq: Once | ORAL | Status: AC
  Administered 2016-05-09: 10 mg via ORAL
  Filled 2016-05-09: qty 1

## 2016-05-09 MED ORDER — DIAZEPAM 5 MG PO TABS
10.0000 mg | ORAL_TABLET | Freq: Once | ORAL | Status: AC
  Administered 2016-05-09: 10 mg via ORAL
  Filled 2016-05-09: qty 2

## 2016-05-09 MED ORDER — ONDANSETRON HCL 4 MG PO TABS
4.0000 mg | ORAL_TABLET | Freq: Once | ORAL | Status: AC
  Administered 2016-05-09: 4 mg via ORAL
  Filled 2016-05-09: qty 1

## 2016-05-09 NOTE — ED Notes (Signed)
Pt reports back pain that extends to her toes with tingling to her R thigh- She denies incontinence of urine or stool- reports was here recently for same and took a flexeril and it did not help

## 2016-05-09 NOTE — ED Notes (Signed)
Several attempts to obtain urine from pt unsuccessful- She is unable to provide specimen at this time

## 2016-05-09 NOTE — ED Provider Notes (Signed)
[facility]Y DEPT Provider Note   CSN: [id number] Arrival date & time: 05/09/16  2128  By signing my name below, I, [name], attest that this documentation has been prepared under the direction and in the presence of Ivery Quale, PA-C. Electronically Signed: [name], ED Scribe. 05/09/16. 10:35 PM.  History   Chief Complaint Chief Complaint  Patient presents with  . Back Pain   The history is provided by the patient. No language interpreter was used.  Back Pain   This is a recurrent problem. The current episode started 1 to 2 hours ago. The problem has been gradually worsening. Associated with: bending at the waist. The pain is present in the lumbar spine. The quality of the pain is described as shooting (sharp). The pain radiates to the right thigh. The pain is moderate. The symptoms are aggravated by certain positions. The pain is the same all the time. Associated symptoms include tingling. Pertinent negatives include no numbness, no bowel incontinence, no bladder incontinence and no weakness. She has tried nothing for the symptoms.  HPI Comments: Theresa Crawford is a 35 y.o. female with a hx of CHF, HTN, and sciatica who presents to the Emergency Department complaining of sudden onset, sharp, shooting right lower back pain that radiates to the right posterior thigh onset 2 hours ago. Pt states that she bent over to pick up her shoe when she could not stand back up straight due to pain. She reports associated tingling to the right foot. She states it was very difficult to get down from the step on her porch and has trouble with laying down in certain positions. She has not tried anything for her symptoms. She denies bladder or bowel incontinence, numbness, or weakness.  Past Medical History:  Diagnosis Date  . CHF (congestive heart failure) (HCC)   . Congestive heart failure (HCC)   . History of open heart surgery   . Hypertension   . Mitral valve disease    mitral  regurgitation    Patient Active Problem List   Diagnosis Date Noted  . Respiratory distress 02/16/2016  . Essential hypertension 02/16/2016  . CHF (congestive heart failure) (HCC) 11/26/2015    Past Surgical History:  Procedure Laterality Date  . ABDOMINAL HYSTERECTOMY    . CESAREAN SECTION     X's 2  . MITRAL VALVE REPAIR    . open heart surgery    . PARTIAL HYSTERECTOMY  2011   PID w/problem with fallopian tubes    OB History    No data available       Home Medications    Prior to Admission medications   Medication Sig Start Date End Date Taking? Authorizing Provider  amLODipine (NORVASC) 5 MG tablet Take 5 mg by mouth daily. 10/29/15   Historical Provider, MD  bumetanide (BUMEX) 1 MG tablet Take 1 mg by mouth 2 (two) times daily. 10/31/15   Historical Provider, MD  citalopram (CELEXA) 40 MG tablet Take 40 mg by mouth 2 (two) times daily.     Historical Provider, MD  clopidogrel (PLAVIX) 75 MG tablet Take 75 mg by mouth daily. 10/29/15   Historical Provider, MD  cyclobenzaprine (FLEXERIL) 10 MG tablet Take 1 tablet (10 mg total) by mouth 3 (three) times daily as needed (muscle soreness). 04/30/16   Devoria Albe, MD  ferrous sulfate 325 (65 FE) MG tablet Take 325 mg by mouth 2 (two) times daily with a meal.    Historical Provider, MD  losartan (COZAAR) 50  MG tablet Take 50 mg by mouth 2 (two) times daily. 10/29/15   Historical Provider, MD  meclizine (ANTIVERT) 25 MG tablet Take 1 tablet (25 mg total) by mouth 3 (three) times daily as needed for dizziness. 04/26/16   Bethann Berkshire, MD  metoprolol succinate (TOPROL-XL) 50 MG 24 hr tablet Take 50 mg by mouth daily. 10/29/15   Historical Provider, MD  potassium chloride SA (K-DUR,KLOR-CON) 20 MEQ tablet Take 20 mEq by mouth 2 (two) times daily. 10/29/15   Historical Provider, MD  predniSONE (DELTASONE) 20 MG tablet Take 3 po QD x 3d , then 2 po QD x 3d then 1 po QD x 3d 04/30/16   Devoria Albe, MD  QUEtiapine (SEROQUEL) 25 MG tablet Take 25  mg by mouth 2 (two) times daily.    Historical Provider, MD  spironolactone (ALDACTONE) 25 MG tablet Take 25 mg by mouth daily. 10/29/15   Historical Provider, MD  STOOL SOFTENER 100 MG capsule Take 100 mg by mouth daily. 10/29/15   Historical Provider, MD  traMADol (ULTRAM) 50 MG tablet Take 2 tablets (100 mg total) by mouth every 6 (six) hours as needed. 04/30/16   Devoria Albe, MD    Family History Family History  Problem Relation Age of Onset  . Heart failure Father     just received LVAD  . Heart disease Paternal Grandfather     stent placement    Social History Social History  Substance Use Topics  . Smoking status: Current Every Day Smoker    Packs/day: 0.25    Types: Cigarettes    Start date: 11/03/1999  . Smokeless tobacco: Never Used  . Alcohol use No     Allergies   Lisinopril; Penicillins; Bee venom; Contrast media [iodinated diagnostic agents]; and Shellfish allergy   Review of Systems Review of Systems  Gastrointestinal: Negative for bowel incontinence.  Genitourinary: Negative for bladder incontinence.  Musculoskeletal: Positive for back pain.  Neurological: Positive for tingling. Negative for weakness and numbness.  All other systems reviewed and are negative.    Physical Exam Updated Vital Signs BP 118/66 (BP Location: Left Arm)   Pulse 101   Temp 98.2 F (36.8 C) (Oral)   Resp 16   Ht 5\' 4"  (1.626 m)   Wt 225 lb (102.1 kg)   LMP 04/19/2016   SpO2 100%   BMI 38.62 kg/m   Physical Exam  Constitutional: She is oriented to person, place, and time. She appears well-developed and well-nourished.  HENT:  Head: Normocephalic and atraumatic.  Eyes: EOM are normal. Pupils are equal, round, and reactive to light.  Neck: Normal range of motion. Neck supple.  Cardiovascular: Normal rate, regular rhythm and normal heart sounds.  Exam reveals no gallop and no friction rub.   No murmur heard. Pulmonary/Chest: Effort normal and breath sounds normal. She has no  wheezes.  Abdominal: Soft. There is no tenderness.  Musculoskeletal: Normal range of motion.  Neurological: She is alert and oriented to person, place, and time. No sensory deficit.  No sensory deficits of the lower extremities; good motor of BLE; pain with bending of the knee on the right  Skin: Skin is warm and dry.  Psychiatric: She has a normal mood and affect. Her behavior is normal.  Nursing note and vitals reviewed.    ED Treatments / Results  DIAGNOSTIC STUDIES: Oxygen Saturation is 100% on RA, normal by my interpretation.    COORDINATION OF CARE: 10:33 PM-Discussed treatment plan which includes CT scan with  pt at bedside and pt agreed to plan.    Labs (all labs ordered are listed, but only abnormal results are displayed) Labs Reviewed - No data to display  EKG  EKG Interpretation None       Radiology Ct Lumbar Spine Wo Contrast  Result Date: 05/10/2016 CLINICAL DATA:  Severe low back pain after picking up object today, RIGHT leg difficulty standing and walking. EXAM: CT LUMBAR SPINE WITHOUT CONTRAST TECHNIQUE: Multidetector CT imaging of the lumbar spine was performed without intravenous contrast administration. Multiplanar CT image reconstructions were also generated. COMPARISON:  None. FINDINGS: Large body habitus results in overall noisy image quality. SEGMENTATION: For the purposes of this report the last well-formed intervertebral disc space will be described as L5-S1, transitional anatomy with lumbarized S1 vertebral body in small S1-2 disc. ALIGNMENT: Lumbar vertebral bodies in alignment, maintenance of the lumbar lordosis. OSSEOUS STRUCTURES: Lumbar vertebral bodies and posterior elements are intact. Chronically fragmented LEFT L3 inferior articular facet on degenerative basis. Moderate L5-S1 disc height loss, endplate spurring and vacuum discs compatible with degenerative disc. Moderate RIGHT central L5-S1 broad-based disc osteophyte complex could affect the  traversing RIGHT S1 nerve. Moderate to severe RIGHT L5-S1 neural foraminal narrowing. Small RIGHT central L4-5 disc protrusion. No destructive bony lesions. SOFT TISSUES: Included prevertebral and paraspinal soft tissues are nonsuspicious. Partially imaged LEFT pleural effusion. Trace calcific atherosclerosis of the aortoiliac vessels. IMPRESSION: No acute fracture or malalignment. Small RIGHT central L4-5 disc protrusion. Moderate RIGHT central L5-S1 broad-based disc osteophyte complex could affect the traversing RIGHT S1 nerve. Moderate to severe RIGHT L5-S1 neural foraminal narrowing. Partially imaged LEFT pleural effusion.  Recommend chest radiograph. Mild atherosclerosis. Electronically Signed   By: Awilda Metroourtnay  Bloomer M.D.   On: 05/10/2016 00:42    Procedures Procedures (including critical care time)  Medications Ordered in ED Medications - No data to display   Initial Impression / Assessment and Plan / ED Course  I have reviewed the triage vital signs and the nursing notes.  Pertinent labs & imaging results that were available during my care of the patient were reviewed by me and considered in my medical decision making (see chart for details).  Clinical Course    **I have reviewed nursing notes, vital signs, and all appropriate lab and imaging results for this patient.*  Final Clinical Impressions(s) / ED Diagnoses  Urine pregnancy is negative. Urinalysis shows no acute abnormality.  CT scan of the lumbar spine shows a small disc protrusion on the right at the L4-L5 area. The right central disc shows osteophyte formation affecting the right S1 nerve area. There is also moderate to severe foraminal narrowing present. No evidence for caudal equina or other emergent findings. I discussed the findings with the patient. I discussed the importance of seeing the with a peak specialist as soon as possible. Prescription for pain medication and muscle relaxer given to the patient. Patient  referred to orthopedics. The patient is to return if any acute neurologic changes or other problems before she is seen by orthopedics.    Final diagnoses:  Degenerative disc disease, lumbar  **I personally performed the services described in this documentation, which was scribed in my presence. The recorded information has been reviewed and is accurate.*  New Prescriptions New Prescriptions   No medications on file     Ivery QualeHobson Najla Aughenbaugh, PA-C 05/10/16 2135    Samuel JesterKathleen McManus, DO 05/13/16 2222

## 2016-05-09 NOTE — ED Triage Notes (Signed)
Pt states she bent over to pick up her shoes and had sudden sharp shooting pain in her mid lumbar and down her R leg.

## 2016-05-10 LAB — URINALYSIS, ROUTINE W REFLEX MICROSCOPIC
Bilirubin Urine: NEGATIVE
Glucose, UA: NEGATIVE mg/dL
Hgb urine dipstick: NEGATIVE
LEUKOCYTES UA: NEGATIVE
Nitrite: NEGATIVE
PROTEIN: NEGATIVE mg/dL
SPECIFIC GRAVITY, URINE: 1.02 (ref 1.005–1.030)
pH: 6 (ref 5.0–8.0)

## 2016-05-10 LAB — POC URINE PREG, ED: PREG TEST UR: NEGATIVE

## 2016-05-10 MED ORDER — TRAMADOL HCL 50 MG PO TABS: ORAL_TABLET | ORAL | 0 refills | Status: DC

## 2016-05-10 MED ORDER — DEXAMETHASONE 4 MG PO TABS: 4.0000 mg | ORAL_TABLET | Freq: Two times a day (BID) | ORAL | 0 refills | Status: DC

## 2016-05-10 MED ORDER — CYCLOBENZAPRINE HCL 10 MG PO TABS: 10.0000 mg | ORAL_TABLET | Freq: Three times a day (TID) | ORAL | 0 refills | Status: DC

## 2016-05-10 MED ORDER — DICLOFENAC SODIUM 75 MG PO TBEC: 75.0000 mg | DELAYED_RELEASE_TABLET | Freq: Two times a day (BID) | ORAL | 0 refills | Status: DC

## 2016-05-10 NOTE — Discharge Instructions (Signed)
Your examination reveals degenerative disc disease involving the L4-L5 area. The more severe area is the L5-S1 area, as there is some narrowing of the foraminal tract as well as some arthritis changes at this area that may be affecting the S1 nerve. It is important that you see Dr.Swinteck, or the orthopedic specialist of your choice for management of this issue. Heating pad to your lower back maybe helpful. Please use Decadron and diclofenac 2 times daily with food. Use of Flexeril 3 times daily which will help with spasm pain. May use Ultram for more severe pain. Ultram and Flexeril may cause drowsiness, please do not drive, drink alcohol, or operate machinery while taking this medication.

## 2016-05-10 NOTE — ED Notes (Signed)
Out of bed to bathroom for specimen

## 2016-05-10 NOTE — ED Notes (Signed)
Pt continues to be unable unwilling to provide specimen

## 2016-05-23 ENCOUNTER — Inpatient Hospital Stay (HOSPITAL_COMMUNITY)
Admission: EM | Admit: 2016-05-23 | Discharge: 2016-05-27 | DRG: 291 | Disposition: A | Discharge status: Home / Self Care | Payer: Medicaid Other | Attending: Internal Medicine | Admitting: Internal Medicine

## 2016-05-23 ENCOUNTER — Encounter (HOSPITAL_COMMUNITY): Payer: Self-pay | Admitting: Emergency Medicine

## 2016-05-23 ENCOUNTER — Emergency Department (HOSPITAL_COMMUNITY): Payer: Medicaid Other

## 2016-05-23 DIAGNOSIS — R0602 Shortness of breath: Secondary | ICD-10-CM

## 2016-05-23 DIAGNOSIS — Z90710 Acquired absence of both cervix and uterus: Secondary | ICD-10-CM

## 2016-05-23 DIAGNOSIS — F17210 Nicotine dependence, cigarettes, uncomplicated: Secondary | ICD-10-CM | POA: Diagnosis present

## 2016-05-23 DIAGNOSIS — D509 Iron deficiency anemia, unspecified: Secondary | ICD-10-CM | POA: Diagnosis present

## 2016-05-23 DIAGNOSIS — Z8249 Family history of ischemic heart disease and other diseases of the circulatory system: Secondary | ICD-10-CM

## 2016-05-23 DIAGNOSIS — J9601 Acute respiratory failure with hypoxia: Secondary | ICD-10-CM | POA: Diagnosis present

## 2016-05-23 DIAGNOSIS — Z79899 Other long term (current) drug therapy: Secondary | ICD-10-CM

## 2016-05-23 DIAGNOSIS — Z7902 Long term (current) use of antithrombotics/antiplatelets: Secondary | ICD-10-CM | POA: Diagnosis not present

## 2016-05-23 DIAGNOSIS — I509 Heart failure, unspecified: Secondary | ICD-10-CM

## 2016-05-23 DIAGNOSIS — J189 Pneumonia, unspecified organism: Secondary | ICD-10-CM | POA: Diagnosis present

## 2016-05-23 DIAGNOSIS — I110 Hypertensive heart disease with heart failure: Secondary | ICD-10-CM | POA: Diagnosis not present

## 2016-05-23 DIAGNOSIS — D72829 Elevated white blood cell count, unspecified: Secondary | ICD-10-CM | POA: Diagnosis present

## 2016-05-23 DIAGNOSIS — I272 Other secondary pulmonary hypertension: Secondary | ICD-10-CM | POA: Diagnosis present

## 2016-05-23 DIAGNOSIS — Z951 Presence of aortocoronary bypass graft: Secondary | ICD-10-CM

## 2016-05-23 DIAGNOSIS — E876 Hypokalemia: Secondary | ICD-10-CM | POA: Diagnosis present

## 2016-05-23 DIAGNOSIS — I959 Hypotension, unspecified: Secondary | ICD-10-CM | POA: Diagnosis present

## 2016-05-23 DIAGNOSIS — D72819 Decreased white blood cell count, unspecified: Secondary | ICD-10-CM | POA: Diagnosis present

## 2016-05-23 DIAGNOSIS — I5033 Acute on chronic diastolic (congestive) heart failure: Secondary | ICD-10-CM | POA: Diagnosis present

## 2016-05-23 DIAGNOSIS — R042 Hemoptysis: Secondary | ICD-10-CM

## 2016-05-23 DIAGNOSIS — E669 Obesity, unspecified: Secondary | ICD-10-CM | POA: Diagnosis present

## 2016-05-23 DIAGNOSIS — I050 Rheumatic mitral stenosis: Secondary | ICD-10-CM | POA: Diagnosis not present

## 2016-05-23 DIAGNOSIS — R079 Chest pain, unspecified: Secondary | ICD-10-CM

## 2016-05-23 DIAGNOSIS — I10 Essential (primary) hypertension: Secondary | ICD-10-CM | POA: Diagnosis not present

## 2016-05-23 DIAGNOSIS — O9089 Other complications of the puerperium, not elsewhere classified: Secondary | ICD-10-CM

## 2016-05-23 DIAGNOSIS — Z9889 Other specified postprocedural states: Secondary | ICD-10-CM

## 2016-05-23 DIAGNOSIS — I342 Nonrheumatic mitral (valve) stenosis: Secondary | ICD-10-CM | POA: Diagnosis not present

## 2016-05-23 DIAGNOSIS — D649 Anemia, unspecified: Secondary | ICD-10-CM

## 2016-05-23 LAB — BASIC METABOLIC PANEL
Anion gap: 9 (ref 5–15)
BUN: 9 mg/dL (ref 6–20)
CALCIUM: 8.5 mg/dL — AB (ref 8.9–10.3)
CO2: 21 mmol/L — ABNORMAL LOW (ref 22–32)
CREATININE: 0.73 mg/dL (ref 0.44–1.00)
Chloride: 100 mmol/L — ABNORMAL LOW (ref 101–111)
Glucose, Bld: 113 mg/dL — ABNORMAL HIGH (ref 65–99)
Potassium: 4 mmol/L (ref 3.5–5.1)
SODIUM: 130 mmol/L — AB (ref 135–145)

## 2016-05-23 LAB — HEPATIC FUNCTION PANEL
ALT: 14 U/L (ref 14–54)
AST: 19 U/L (ref 15–41)
Albumin: 3.5 g/dL (ref 3.5–5.0)
Alkaline Phosphatase: 65 U/L (ref 38–126)
BILIRUBIN DIRECT: 0.8 mg/dL — AB (ref 0.1–0.5)
BILIRUBIN TOTAL: 1.8 mg/dL — AB (ref 0.3–1.2)
Indirect Bilirubin: 1 mg/dL — ABNORMAL HIGH (ref 0.3–0.9)
Total Protein: 6.6 g/dL (ref 6.5–8.1)

## 2016-05-23 LAB — CBC WITH DIFFERENTIAL/PLATELET
BASOS ABS: 0 10*3/uL (ref 0.0–0.1)
Basophils Relative: 0 %
EOS PCT: 0 %
Eosinophils Absolute: 0.1 10*3/uL (ref 0.0–0.7)
HEMATOCRIT: 27.9 % — AB (ref 36.0–46.0)
HEMOGLOBIN: 9 g/dL — AB (ref 12.0–15.0)
LYMPHS ABS: 1.8 10*3/uL (ref 0.7–4.0)
LYMPHS PCT: 8 %
MCH: 25.7 pg — ABNORMAL LOW (ref 26.0–34.0)
MCHC: 32.3 g/dL (ref 30.0–36.0)
MCV: 79.7 fL (ref 78.0–100.0)
Monocytes Absolute: 1.9 10*3/uL — ABNORMAL HIGH (ref 0.1–1.0)
Monocytes Relative: 9 %
NEUTROS PCT: 83 %
Neutro Abs: 18.2 10*3/uL — ABNORMAL HIGH (ref 1.7–7.7)
PLATELETS: 318 10*3/uL (ref 150–400)
RBC: 3.5 MIL/uL — AB (ref 3.87–5.11)
RDW: 17.7 % — ABNORMAL HIGH (ref 11.5–15.5)
WBC: 21.9 10*3/uL — AB (ref 4.0–10.5)

## 2016-05-23 LAB — BRAIN NATRIURETIC PEPTIDE: B NATRIURETIC PEPTIDE 5: 642 pg/mL — AB (ref 0.0–100.0)

## 2016-05-23 LAB — CBC
HEMATOCRIT: 27.9 % — AB (ref 36.0–46.0)
Hemoglobin: 9 g/dL — ABNORMAL LOW (ref 12.0–15.0)
MCH: 25.8 pg — ABNORMAL LOW (ref 26.0–34.0)
MCHC: 32.3 g/dL (ref 30.0–36.0)
MCV: 79.9 fL (ref 78.0–100.0)
Platelets: 282 10*3/uL (ref 150–400)
RBC: 3.49 MIL/uL — ABNORMAL LOW (ref 3.87–5.11)
RDW: 17.9 % — AB (ref 11.5–15.5)
WBC: 21.6 10*3/uL — AB (ref 4.0–10.5)

## 2016-05-23 LAB — LIPASE, BLOOD: LIPASE: 13 U/L (ref 11–51)

## 2016-05-23 LAB — URINE MICROSCOPIC-ADD ON

## 2016-05-23 LAB — TYPE AND SCREEN
ABO/RH(D): A POS
ANTIBODY SCREEN: NEGATIVE

## 2016-05-23 LAB — BLOOD GAS, ARTERIAL
ACID-BASE DEFICIT: 0.9 mmol/L (ref 0.0–2.0)
Bicarbonate: 24 mmol/L (ref 20.0–28.0)
Drawn by: 105551
FIO2: 36
O2 Content: 4 L/min
O2 Saturation: 95 %
pCO2 arterial: 33.4 mmHg (ref 32.0–48.0)
pH, Arterial: 7.447 (ref 7.350–7.450)
pO2, Arterial: 73 mmHg — ABNORMAL LOW (ref 83.0–108.0)

## 2016-05-23 LAB — URINALYSIS, ROUTINE W REFLEX MICROSCOPIC
Bilirubin Urine: NEGATIVE
Glucose, UA: NEGATIVE mg/dL
Hgb urine dipstick: NEGATIVE
Ketones, ur: NEGATIVE mg/dL
Nitrite: NEGATIVE
PROTEIN: NEGATIVE mg/dL
Specific Gravity, Urine: <1.005 — ABNORMAL LOW (ref 1.005–1.030)
pH: 6.5 (ref 5.0–8.0)

## 2016-05-23 LAB — I-STAT TROPONIN, ED: TROPONIN I, POC: 0.04 ng/mL (ref 0.00–0.08)

## 2016-05-23 LAB — LACTIC ACID, PLASMA: Lactic Acid, Venous: 1.1 mmol/L (ref 0.5–1.9)

## 2016-05-23 MED ORDER — QUETIAPINE FUMARATE 25 MG PO TABS
25.0000 mg | ORAL_TABLET | Freq: Two times a day (BID) | ORAL | Status: DC
  Administered 2016-05-24 – 2016-05-27 (×8): 25 mg via ORAL
  Filled 2016-05-23 (×12): qty 1

## 2016-05-23 MED ORDER — SODIUM CHLORIDE 0.9 % IV BOLUS (SEPSIS)
1000.0000 mL | Freq: Once | INTRAVENOUS | Status: AC
Start: 2016-05-23 — End: 2016-05-23
  Administered 2016-05-23: 1000 mL via INTRAVENOUS

## 2016-05-23 MED ORDER — DOCUSATE SODIUM 100 MG PO CAPS
100.0000 mg | ORAL_CAPSULE | Freq: Every day | ORAL | Status: DC
  Administered 2016-05-24 – 2016-05-27 (×4): 100 mg via ORAL
  Filled 2016-05-23 (×4): qty 1

## 2016-05-23 MED ORDER — SPIRONOLACTONE 25 MG PO TABS
25.0000 mg | ORAL_TABLET | Freq: Every day | ORAL | Status: DC
  Administered 2016-05-24 – 2016-05-25 (×2): 25 mg via ORAL
  Filled 2016-05-23 (×2): qty 1

## 2016-05-23 MED ORDER — ACETAMINOPHEN 325 MG PO TABS: 650.0000 mg | ORAL_TABLET | ORAL | Status: DC | PRN

## 2016-05-23 MED ORDER — SODIUM CHLORIDE 0.9 % IV BOLUS (SEPSIS)
1000.0000 mL | Freq: Once | INTRAVENOUS | Status: AC
  Administered 2016-05-23: 1000 mL via INTRAVENOUS

## 2016-05-23 MED ORDER — FUROSEMIDE 10 MG/ML IJ SOLN
40.0000 mg | Freq: Two times a day (BID) | INTRAMUSCULAR | Status: DC
  Administered 2016-05-24 – 2016-05-26 (×7): 40 mg via INTRAVENOUS
  Filled 2016-05-23 (×7): qty 4

## 2016-05-23 MED ORDER — IPRATROPIUM-ALBUTEROL 0.5-2.5 (3) MG/3ML IN SOLN
3.0000 mL | Freq: Once | RESPIRATORY_TRACT | Status: AC
  Administered 2016-05-23: 3 mL via RESPIRATORY_TRACT
  Filled 2016-05-23: qty 3

## 2016-05-23 MED ORDER — DEXTROSE 5 % IV SOLN
2.0000 g | Freq: Once | INTRAVENOUS | Status: AC
  Administered 2016-05-23: 2 g via INTRAVENOUS
  Filled 2016-05-23: qty 2

## 2016-05-23 MED ORDER — FUROSEMIDE 10 MG/ML IJ SOLN
40.0000 mg | Freq: Once | INTRAMUSCULAR | Status: AC
  Administered 2016-05-23: 40 mg via INTRAVENOUS
  Filled 2016-05-23: qty 4

## 2016-05-23 MED ORDER — FERROUS SULFATE 325 (65 FE) MG PO TABS
325.0000 mg | ORAL_TABLET | Freq: Two times a day (BID) | ORAL | Status: DC
  Administered 2016-05-24 – 2016-05-27 (×7): 325 mg via ORAL
  Filled 2016-05-23 (×7): qty 1

## 2016-05-23 MED ORDER — SODIUM CHLORIDE 0.9 % IV SOLN: 250.0000 mL | INTRAVENOUS | Status: DC | PRN

## 2016-05-23 MED ORDER — LOSARTAN POTASSIUM 50 MG PO TABS
50.0000 mg | ORAL_TABLET | Freq: Two times a day (BID) | ORAL | Status: DC
  Administered 2016-05-24: 50 mg via ORAL
  Filled 2016-05-23: qty 1

## 2016-05-23 MED ORDER — SODIUM CHLORIDE 0.9% FLUSH: 3.0000 mL | INTRAVENOUS | Status: DC | PRN

## 2016-05-23 MED ORDER — CYCLOBENZAPRINE HCL 10 MG PO TABS
10.0000 mg | ORAL_TABLET | Freq: Three times a day (TID) | ORAL | Status: DC
  Administered 2016-05-24 – 2016-05-27 (×10): 10 mg via ORAL
  Filled 2016-05-23 (×11): qty 1

## 2016-05-23 MED ORDER — ONDANSETRON HCL 4 MG/2ML IJ SOLN
4.0000 mg | Freq: Four times a day (QID) | INTRAMUSCULAR | Status: DC | PRN
Start: 2016-05-23 — End: 2016-05-27

## 2016-05-23 MED ORDER — SODIUM CHLORIDE 0.9% FLUSH
3.0000 mL | Freq: Two times a day (BID) | INTRAVENOUS | Status: DC
  Administered 2016-05-24 – 2016-05-27 (×8): 3 mL via INTRAVENOUS

## 2016-05-23 MED ORDER — DEXTROSE 5 % IV SOLN
1.0000 g | Freq: Three times a day (TID) | INTRAVENOUS | Status: DC
  Administered 2016-05-24: 1 g via INTRAVENOUS
  Filled 2016-05-23 (×2): qty 1

## 2016-05-23 MED ORDER — AMLODIPINE BESYLATE 5 MG PO TABS: 5.0000 mg | ORAL_TABLET | Freq: Every day | ORAL | Status: DC

## 2016-05-23 MED ORDER — METOPROLOL SUCCINATE ER 50 MG PO TB24
50.0000 mg | ORAL_TABLET | Freq: Every day | ORAL | Status: DC
  Administered 2016-05-25: 50 mg via ORAL
  Filled 2016-05-23 (×2): qty 1

## 2016-05-23 MED ORDER — CITALOPRAM HYDROBROMIDE 20 MG PO TABS
40.0000 mg | ORAL_TABLET | Freq: Two times a day (BID) | ORAL | Status: DC
  Administered 2016-05-24 – 2016-05-27 (×8): 40 mg via ORAL
  Filled 2016-05-23 (×8): qty 2

## 2016-05-23 MED ORDER — LEVOFLOXACIN IN D5W 750 MG/150ML IV SOLN
750.0000 mg | Freq: Once | INTRAVENOUS | Status: AC
Start: 2016-05-23 — End: 2016-05-23
  Administered 2016-05-23: 750 mg via INTRAVENOUS
  Filled 2016-05-23: qty 150

## 2016-05-23 MED ORDER — VANCOMYCIN HCL IN DEXTROSE 1-5 GM/200ML-% IV SOLN
1000.0000 mg | Freq: Three times a day (TID) | INTRAVENOUS | Status: DC
  Administered 2016-05-24: 1000 mg via INTRAVENOUS
  Filled 2016-05-23: qty 200

## 2016-05-23 MED ORDER — SODIUM CHLORIDE 0.9 % IV BOLUS (SEPSIS): 500.0000 mL | Freq: Once | INTRAVENOUS | Status: DC

## 2016-05-23 MED ORDER — VANCOMYCIN HCL IN DEXTROSE 1-5 GM/200ML-% IV SOLN
1000.0000 mg | Freq: Once | INTRAVENOUS | Status: AC
Start: 2016-05-23 — End: 2016-05-23
  Administered 2016-05-23: 1000 mg via INTRAVENOUS
  Filled 2016-05-23: qty 200

## 2016-05-23 NOTE — ED Notes (Signed)
MD made aware of pt's status and need to be seen urgently

## 2016-05-23 NOTE — ED Notes (Signed)
MD at bedside. 

## 2016-05-23 NOTE — ED Provider Notes (Signed)
[facility]Y DEPT Provider Note   CSN: [id number] Arrival date & time: 05/23/16  1735     History   Chief Complaint Chief Complaint  Patient presents with  . Chest Pain    HPI Theresa Crawford is a 35 y.o. female.  The patient presents in severe respiratory distress. Patient stated yesterday she started to have a productive cough of yellow mucus and shortness of breath. Had a fever up to 101 or greater. Today increased shortness of breath and started coughing up red blood. No frothy sputum. Has had some stool increased swelling in her legs for the past few days. No chest pain. But had chest discomfort feeling very short of breath. Patient states that she filled up a cooking cup of blood at home.      Past Medical History:  Diagnosis Date  . CHF (congestive heart failure) (HCC)   . Congestive heart failure (HCC)   . History of open heart surgery   . Hypertension   . Mitral valve disease    mitral regurgitation    Patient Active Problem List   Diagnosis Date Noted  . Acute respiratory failure with hypoxia (HCC) 05/23/2016  . Acute on chronic diastolic CHF (congestive heart failure) (HCC) 05/23/2016  . Cough with hemoptysis 05/23/2016  . Hypertension   . Respiratory distress 02/16/2016  . Essential hypertension 02/16/2016  . CHF (congestive heart failure) (HCC) 11/26/2015    Past Surgical History:  Procedure Laterality Date  . ABDOMINAL HYSTERECTOMY    . CESAREAN SECTION     X's 2  . MITRAL VALVE REPAIR    . open heart surgery    . PARTIAL HYSTERECTOMY  2011   PID w/problem with fallopian tubes    OB History    No data available       Home Medications    Prior to Admission medications   Medication Sig Start Date End Date Taking? Authorizing Provider  amLODipine (NORVASC) 5 MG tablet Take 5 mg by mouth daily. 10/29/15  Yes Historical Provider, MD  bumetanide (BUMEX) 1 MG tablet Take 1 mg by mouth 2 (two) times daily. 10/31/15  Yes Historical Provider, MD    citalopram (CELEXA) 40 MG tablet Take 40 mg by mouth 2 (two) times daily.    Yes Historical Provider, MD  clopidogrel (PLAVIX) 75 MG tablet Take 75 mg by mouth daily. 10/29/15  Yes Historical Provider, MD  cyclobenzaprine (FLEXERIL) 10 MG tablet Take 1 tablet (10 mg total) by mouth 3 (three) times daily. 05/10/16  Yes Ivery Quale, PA-C  ferrous sulfate 325 (65 FE) MG tablet Take 325 mg by mouth 2 (two) times daily with a meal.   Yes Historical Provider, MD  losartan (COZAAR) 50 MG tablet Take 50 mg by mouth 2 (two) times daily. 10/29/15  Yes Historical Provider, MD  metoprolol succinate (TOPROL-XL) 50 MG 24 hr tablet Take 50 mg by mouth daily. 10/29/15  Yes Historical Provider, MD  potassium chloride SA (K-DUR,KLOR-CON) 20 MEQ tablet Take 20 mEq by mouth 2 (two) times daily. 10/29/15  Yes Historical Provider, MD  QUEtiapine (SEROQUEL) 25 MG tablet Take 25 mg by mouth 2 (two) times daily.   Yes Historical Provider, MD  spironolactone (ALDACTONE) 25 MG tablet Take 25 mg by mouth daily. 10/29/15  Yes Historical Provider, MD  STOOL SOFTENER 100 MG capsule Take 100 mg by mouth daily. 10/29/15  Yes Historical Provider, MD  dexamethasone (DECADRON) 4 MG tablet Take 1 tablet (4 mg total) by mouth 2 (two)  times daily with a meal. Patient not taking: Reported on 05/23/2016 05/10/16   Ivery QualeHobson Bryant, PA-C  diclofenac (VOLTAREN) 75 MG EC tablet Take 1 tablet (75 mg total) by mouth 2 (two) times daily. Patient not taking: Reported on 05/23/2016 05/10/16   Ivery QualeHobson Bryant, PA-C  meclizine (ANTIVERT) 25 MG tablet Take 1 tablet (25 mg total) by mouth 3 (three) times daily as needed for dizziness. Patient not taking: Reported on 05/23/2016 04/26/16   Bethann BerkshireJoseph Zammit, MD  predniSONE (DELTASONE) 20 MG tablet Take 3 po QD x 3d , then 2 po QD x 3d then 1 po QD x 3d Patient not taking: Reported on 05/23/2016 04/30/16   Devoria AlbeIva Knapp, MD  traMADol Janean Sark(ULTRAM) 50 MG tablet 1 or 2 po q6h prn pain Patient not taking: Reported on 05/23/2016  05/10/16   Ivery QualeHobson Bryant, PA-C    Family History Family History  Problem Relation Age of Onset  . Heart failure Father     just received LVAD  . Heart disease Paternal Grandfather     stent placement    Social History Social History  Substance Use Topics  . Smoking status: Current Every Day Smoker    Packs/day: 0.25    Types: Cigarettes    Start date: 11/03/1999  . Smokeless tobacco: Never Used  . Alcohol use No     Allergies   Lisinopril; Penicillins; Bee venom; Contrast media [iodinated diagnostic agents]; and Shellfish allergy   Review of Systems Review of Systems  Constitutional: Positive for fever. Negative for diaphoresis.  HENT: Negative for congestion.   Eyes: Negative for visual disturbance.  Respiratory: Positive for cough, shortness of breath and wheezing.   Cardiovascular: Positive for leg swelling.  Gastrointestinal: Negative for abdominal pain, nausea and vomiting.  Genitourinary: Negative for dysuria.  Musculoskeletal: Negative for back pain.  Skin: Negative for rash.  Neurological: Negative for headaches.  Hematological: Does not bruise/bleed easily.  Psychiatric/Behavioral: Negative for confusion.     Physical Exam Updated Vital Signs BP 105/61   Pulse (!) 124   Temp 98.4 F (36.9 C) (Temporal)   Resp 22   Ht 5\' 8"  (1.727 m)   Wt 102.1 kg   LMP 04/19/2016 Comment: hx ovaries removed  SpO2 95%   BMI 34.21 kg/m   Physical Exam  Constitutional: She is oriented to person, place, and time. She appears well-developed and well-nourished. She appears distressed.  HENT:  Head: Normocephalic and atraumatic.  Eyes: Conjunctivae and EOM are normal. Pupils are equal, round, and reactive to light.  Neck: Normal range of motion. Neck supple.  Cardiovascular: Regular rhythm.   Tachycardic  Pulmonary/Chest: Breath sounds normal. She is in respiratory distress. She has no wheezes. She has no rales.  Abdominal: Soft. Bowel sounds are normal. There is no  tenderness.  Musculoskeletal: Normal range of motion. She exhibits edema.  Neurological: She is alert and oriented to person, place, and time. She displays normal reflexes. No cranial nerve deficit. She exhibits normal muscle tone.  Skin: Skin is warm.  Nursing note and vitals reviewed.    ED Treatments / Results  Labs (all labs ordered are listed, but only abnormal results are displayed) Labs Reviewed  BASIC METABOLIC PANEL - Abnormal; Notable for the following:       Result Value   Sodium 130 (*)    Chloride 100 (*)    CO2 21 (*)    Glucose, Bld 113 (*)    Calcium 8.5 (*)    All other components within  normal limits  CBC - Abnormal; Notable for the following:    WBC 21.6 (*)    RBC 3.49 (*)    Hemoglobin 9.0 (*)    HCT 27.9 (*)    MCH 25.8 (*)    RDW 17.9 (*)    All other components within normal limits  CBC WITH DIFFERENTIAL/PLATELET - Abnormal; Notable for the following:    WBC 21.9 (*)    RBC 3.50 (*)    Hemoglobin 9.0 (*)    HCT 27.9 (*)    MCH 25.7 (*)    RDW 17.7 (*)    Neutro Abs 18.2 (*)    Monocytes Absolute 1.9 (*)    All other components within normal limits  URINALYSIS, ROUTINE W REFLEX MICROSCOPIC (NOT AT [facility]) - Abnormal; Notable for the following:    Specific Gravity, Urine <1.005 (*)    Leukocytes, UA TRACE (*)    All other components within normal limits  BRAIN NATRIURETIC PEPTIDE - Abnormal; Notable for the following:    B Natriuretic Peptide 642.0 (*)    All other components within normal limits  HEPATIC FUNCTION PANEL - Abnormal; Notable for the following:    Total Bilirubin 1.8 (*)    Bilirubin, Direct 0.8 (*)    Indirect Bilirubin 1.0 (*)    All other components within normal limits  URINE MICROSCOPIC-ADD ON - Abnormal; Notable for the following:    Squamous Epithelial / LPF TOO NUMEROUS TO COUNT (*)    Bacteria, UA FEW (*)    All other components within normal limits  CULTURE, BLOOD (ROUTINE X 2)  CULTURE, BLOOD (ROUTINE X 2)  URINE  CULTURE  LACTIC ACID, PLASMA  LIPASE, BLOOD  LACTIC ACID, PLASMA  BLOOD GAS, ARTERIAL  I-STAT TROPOININ, ED  I-STAT TROPOININ, ED  TYPE AND SCREEN    EKG  EKG Interpretation  Date/Time:  Sunday May 23 2016 17:43:51 EDT Ventricular Rate:  132 PR Interval:  146 QRS Duration: 74 QT Interval:  292 QTC Calculation: 432 R Axis:   68 Text Interpretation:  Sinus tachycardia Possible Left atrial enlargement Nonspecific ST abnormality Abnormal ECG Confirmed by Ahren Pettinger  MD, Janiya Millirons ([id number]) on 05/23/2016 7:09:44 PM       Radiology Ct Chest Wo Contrast  Result Date: 05/23/2016 CLINICAL DATA:  Chest pain EXAM: CT CHEST WITHOUT CONTRAST TECHNIQUE: Multidetector CT imaging of the chest was performed following the standard protocol without IV contrast. COMPARISON:  06/06/2014 FINDINGS: Cardiovascular: Moderate cardiac enlargement. Previous median sternotomy and mitral valve replacement. No pericardial effusion. Aortic atherosclerosis noted. Mediastinum/Nodes: The trachea appears patent and is midline. Normal appearance of the esophagus. Multiple calcify scratch set numerous calcified and noncalcified lymph nodes are identified within the mediastinum. Noncalcified pre-vascular lymph node is enlarged measuring 11 mm, image number 42 of series 2. Noncalcified sub- carinal lymph node is also enlarged measuring 1.9 cm. Noncalcified right paratracheal lymph node measures 1.3 cm, image 28 of series 2. Small axillary lymph nodes are identified. Lungs/Pleura: Small to moderate bilateral pleural effusions are identified left greater than right. Subsegmental atelectasis is identified within the left lower lobe. Diffuse bilateral ground-glass attenuation in interlobular septal thickening is noted. Upper Abdomen: No acute abnormality. Musculoskeletal: No chest wall mass or suspicious bone lesions identified. IMPRESSION: 1. Bilateral pleural effusions, interlobular septal thickening and diffuse ground-glass  attenuation is noted. Findings are favored to represent marked pulmonary edema perhaps secondary to congestive heart failure. Other considerations include diffuse alveolar hemorrhage and ARDS. 2. Cardiac enlargement. 3. Calcified and  noncalcified lymph nodes are identified. A similar appearance was found on CT from 06/06/2014. Findings may reflect sequelae of granulomatous inflammation or infection. Electronically Signed   By: Signa Kell M.D.   On: 05/23/2016 20:42   Dg Chest Port 1 View  Result Date: 05/23/2016 CLINICAL DATA:  Chest pain EXAM: PORTABLE CHEST 1 VIEW COMPARISON:  04/26/2016 FINDINGS: Previous median sternotomy and CABG procedure. There is a moderate right pleural effusion. Diffuse hazy lung opacities are identified throughout both lungs, compatible with marked pulmonary edema. IMPRESSION: 1. Suspect marked pulmonary edema perhaps secondary to CHF. Electronically Signed   By: Signa Kell M.D.   On: 05/23/2016 18:21    Procedures Procedures (including critical care time) CRITICAL CARE Performed by: Vanetta Mulders Total critical care time: 30 minutes Critical care time was exclusive of separately billable procedures and treating other patients. Critical care was necessary to treat or prevent imminent or life-threatening deterioration. Critical care was time spent personally by me on the following activities: development of treatment plan with patient and/or surrogate as well as nursing, discussions with consultants, evaluation of patient's response to treatment, examination of patient, obtaining history from patient or surrogate, ordering and performing treatments and interventions, ordering and review of laboratory studies, ordering and review of radiographic studies, pulse oximetry and re-evaluation of patient's condition.   Medications Ordered in ED Medications  sodium chloride 0.9 % bolus 1,000 mL (0 mLs Intravenous Stopped 05/23/16 2034)    And  sodium chloride 0.9 %  bolus 1,000 mL (0 mLs Intravenous Stopped 05/23/16 1907)    And  sodium chloride 0.9 % bolus 1,000 mL (1,000 mLs Intravenous New Bag/Given 05/23/16 2037)    And  sodium chloride 0.9 % bolus 500 mL (not administered)  vancomycin (VANCOCIN) IVPB 1000 mg/200 mL premix (1,000 mg Intravenous New Bag/Given 05/23/16 2035)  ipratropium-albuterol (DUONEB) 0.5-2.5 (3) MG/3ML nebulizer solution 3 mL (3 mLs Nebulization Given 05/23/16 1824)  levofloxacin (LEVAQUIN) IVPB 750 mg (0 mg Intravenous Stopped 05/23/16 2034)  aztreonam (AZACTAM) 2 g in dextrose 5 % 50 mL IVPB (0 g Intravenous Stopped 05/23/16 2004)  furosemide (LASIX) injection 40 mg (40 mg Intravenous Given 05/23/16 1900)     Initial Impression / Assessment and Plan / ED Course  I have reviewed the triage vital signs and the nursing notes.  Pertinent labs & imaging results that were available during my care of the patient were reviewed by me and considered in my medical decision making (see chart for details).  Clinical Course   The patient presented in respiratory distress. On 5 L of oxygen satting around 90%. Very tachycardic very tachypnea. Patient states that she started with shortness of breath yesterday had a fever over 101. Was coughing up by yellow mucus. Today started coughing up blood. Increased shortness of breath.  Chart review on patient shows that she has a history of chronic diastolic congestive heart failure. Had a mitral valve repair in 2011 following pericarditis during a pregnancy. And she has a history of hypertension. Patient has a tendency to go into CHF today's presentation in retrospect seems to fit that.  Early on it seemed as if it was more related to sepsis. Patient has sepsis protocol started. At 2 L of fluid that impair Antibiotics lactic acid was normal chest x-ray raises concerns for pleural effusion and CHF. Patient actually got better with the fluids. Later was given 40 mg grams of Lasix. CT of chest shows bilateral  pleural effusions groundglass appearance could be due to an  ARDS picture or CHF. Patient states that her CHF usually presents like this but she's also had pneumonia before.  Arterial blood gas was has been ordered patient will be admitted to step down unit by hospitalist. Patient is followed by cardiology here. Patient's troponin was normal.   Final Clinical Impressions(s) / ED Diagnoses   Final diagnoses:  SOB (shortness of breath)  Cough with hemoptysis  Congestive heart failure, unspecified congestive heart failure chronicity, unspecified congestive heart failure type [facility])    New Prescriptions New Prescriptions   No medications on file     Vanetta Mulders, MD 05/23/16 2131

## 2016-05-23 NOTE — ED Triage Notes (Signed)
Patient c/o mid -sternal chest pain/pressure with extreme shortness of breath. Per patient coughing up pink frothy sputum. Patient reports hx of open heart surgery for mitral valve repair. Patient diaphoretic. Respirations labored with HR of 140s. O2 sat 100% on room arm.

## 2016-05-23 NOTE — ED Notes (Signed)
Dr. Deretha EmoryZackowski made aware of pt.

## 2016-05-23 NOTE — H&P (Signed)
History and Physical    Theresa DaneLatasha Y Crawford ZOX:[id number] DOB: 02-16-1981 DOA: 05/23/2016  PCP: Jerrell BelfastHouse, [name], [name]  Patient coming from:  home  Chief Complaint:   sob  HPI: Theresa Crawford is a 35 y.o. female with medical history significant of diastolic CHF and mitral valve repair in 2015 after having peripartum pericarditis, HTN, obesity comes in with over one day of progressive worsening sob and cough which has been with blood.  She has had swelling in her legs and abdomen.  She reports her dry weight is around 220lbs, she is 235lbs today.  The last time she weighed herself was 3 days ago and she was around 223lbs.  Today she reports she started coughing up a lot of blood about a cup full and was very short of breath.  She reported a fever of over 102.  She came to the Ed.  Pt reports this is what typically happens with her heart failure problems.  Pt reports she moved here in Nov 2016 and has [facility] since then, this is her first hospitalization here.  She has a h/o of only needing intubation once when she swallowed a bee and it stung her uvula and she went into anaphylactic shock.  She is not on supplemental oxygen at home.  Pt on arrival was very tachypneic, tachycardic, thought she was septic initially so was given 2 liters of ivf bolus, after that she was given lasix 40mg  iv as it became clearer this was CHF.  Pt has urinated at least 6 times since given the lasix.  Her respiratory status has markedly improved.   Of note, she is  On plavix for unclear reason.  She has never had a heart attack. She had a heart cath prior to her mitral valve repair in 2015 and it was clean and she required no stents.  She says she is on plavix for her valve repair.  Review of Systems: As per HPI otherwise 10 point review of systems negative.   Past Medical History:  Diagnosis Date  . CHF (congestive heart failure) (HCC)   . Congestive heart failure (HCC)   . History of open heart  surgery   . Hypertension   . Mitral valve disease    mitral regurgitation    Past Surgical History:  Procedure Laterality Date  . ABDOMINAL HYSTERECTOMY    . CESAREAN SECTION     X's 2  . MITRAL VALVE REPAIR    . open heart surgery    . PARTIAL HYSTERECTOMY  2011   PID w/problem with fallopian tubes     reports that she has been smoking Cigarettes.  She started smoking about 16 years ago. She has been smoking about 0.25 packs per day. She has never used smokeless tobacco. She reports that she does not drink alcohol or use drugs.  Allergies  Allergen Reactions  . Lisinopril Shortness Of Breath and Swelling    Throat swelling  . Penicillins Shortness Of Breath and Swelling    Has patient had a PCN reaction causing immediate rash, facial/tongue/throat swelling, SOB or lightheadedness with hypotension: Yes Has patient had a PCN reaction causing severe rash involving mucus membranes or skin necrosis: Yes Has patient had a PCN reaction that required hospitalization No Has patient had a PCN reaction occurring within the last 10 years: No If all of the above answers are "NO", then may proceed with Cephalosporin use.   . Bee Venom   . Contrast  Media [Iodinated Diagnostic Agents]     difinity  . Shellfish Allergy     Family History  Problem Relation Age of Onset  . Heart failure Father     just received LVAD  . Heart disease Paternal Grandfather     stent placement    Prior to Admission medications   Medication Sig Start Date End Date Taking? Authorizing Provider  amLODipine (NORVASC) 5 MG tablet Take 5 mg by mouth daily. 10/29/15  Yes Historical Provider, MD  bumetanide (BUMEX) 1 MG tablet Take 1 mg by mouth 2 (two) times daily. 10/31/15  Yes Historical Provider, MD  citalopram (CELEXA) 40 MG tablet Take 40 mg by mouth 2 (two) times daily.    Yes Historical Provider, MD  clopidogrel (PLAVIX) 75 MG tablet Take 75 mg by mouth daily. 10/29/15  Yes Historical Provider, MD    cyclobenzaprine (FLEXERIL) 10 MG tablet Take 1 tablet (10 mg total) by mouth 3 (three) times daily. 05/10/16  Yes Ivery Quale, PA-C  ferrous sulfate 325 (65 FE) MG tablet Take 325 mg by mouth 2 (two) times daily with a meal.   Yes Historical Provider, MD  losartan (COZAAR) 50 MG tablet Take 50 mg by mouth 2 (two) times daily. 10/29/15  Yes Historical Provider, MD  metoprolol succinate (TOPROL-XL) 50 MG 24 hr tablet Take 50 mg by mouth daily. 10/29/15  Yes Historical Provider, MD  potassium chloride SA (K-DUR,KLOR-CON) 20 MEQ tablet Take 20 mEq by mouth 2 (two) times daily. 10/29/15  Yes Historical Provider, MD  QUEtiapine (SEROQUEL) 25 MG tablet Take 25 mg by mouth 2 (two) times daily.   Yes Historical Provider, MD  spironolactone (ALDACTONE) 25 MG tablet Take 25 mg by mouth daily. 10/29/15  Yes Historical Provider, MD  STOOL SOFTENER 100 MG capsule Take 100 mg by mouth daily. 10/29/15  Yes Historical Provider, MD  dexamethasone (DECADRON) 4 MG tablet Take 1 tablet (4 mg total) by mouth 2 (two) times daily with a meal. Patient not taking: Reported on 05/23/2016 05/10/16   Ivery Quale, PA-C  diclofenac (VOLTAREN) 75 MG EC tablet Take 1 tablet (75 mg total) by mouth 2 (two) times daily. Patient not taking: Reported on 05/23/2016 05/10/16   Ivery Quale, PA-C  meclizine (ANTIVERT) 25 MG tablet Take 1 tablet (25 mg total) by mouth 3 (three) times daily as needed for dizziness. Patient not taking: Reported on 05/23/2016 04/26/16   Bethann Berkshire, MD  predniSONE (DELTASONE) 20 MG tablet Take 3 po QD x 3d , then 2 po QD x 3d then 1 po QD x 3d Patient not taking: Reported on 05/23/2016 04/30/16   Devoria Albe, MD  traMADol Janean Sark) 50 MG tablet 1 or 2 po q6h prn pain Patient not taking: Reported on 05/23/2016 05/10/16   Ivery Quale, PA-C    Physical Exam: Vitals:   05/23/16 1900 05/23/16 2030 05/23/16 2100 05/23/16 2313  BP: 106/61 105/61 99/60   Pulse: (!) 124  114   Resp: (!) 38 22 22   Temp:    98.7 F  (37.1 C)  TempSrc:    Oral  SpO2: 95%  95%   Weight:    106.9 kg (235 lb 10.8 oz)  Height:    5\' 4"  (1.626 m)    Constitutional: NAD, calm, comfortable Vitals:   05/23/16 1900 05/23/16 2030 05/23/16 2100 05/23/16 2313  BP: 106/61 105/61 99/60   Pulse: (!) 124  114   Resp: (!) 38 22 22   Temp:  98.7 F (37.1 C)  TempSrc:    Oral  SpO2: 95%  95%   Weight:    106.9 kg (235 lb 10.8 oz)  Height:    5\' 4"  (1.626 m)   Eyes: PERRL, lids and conjunctivae normal ENMT: Mucous membranes are moist. Posterior pharynx clear of any exudate or lesions.Normal dentition.  Neck: normal, supple, no masses, no thyromegaly Respiratory: clear to auscultation bilaterally, no wheezing, no crackles. Normal respiratory effort. No accessory muscle use.  Cardiovascular: Regular rate and rhythm, no murmurs / rubs / gallops. No extremity edema. 2+ pedal pulses. No carotid bruits.  Abdomen: no tenderness, no masses palpated. No hepatosplenomegaly. Bowel sounds positive.  Musculoskeletal: no clubbing / cyanosis. No joint deformity upper and lower extremities. Good ROM, no contractures. Normal muscle tone.  Skin: no rashes, lesions, ulcers. No induration  Trace edema Neurologic: CN 2-12 grossly intact. Sensation intact, DTR normal. Strength 5/5 in all 4.  Psychiatric: Normal judgment and insight. Alert and oriented x 3. Normal mood.    Labs on Admission: I have personally reviewed following labs and imaging studies  CBC:  Recent Labs Lab 05/23/16 1752  WBC 21.9*  21.6*  NEUTROABS 18.2*  HGB 9.0*  9.0*  HCT 27.9*  27.9*  MCV 79.7  79.9  PLT 318  282   Basic Metabolic Panel:  Recent Labs Lab 05/23/16 1752  NA 130*  K 4.0  CL 100*  CO2 21*  GLUCOSE 113*  BUN 9  CREATININE 0.73  CALCIUM 8.5*   GFR: Estimated Creatinine Clearance: 116 mL/min (by C-G formula based on SCr of 0.8 mg/dL). Liver Function Tests:  Recent Labs Lab 05/23/16 1752  AST 19  ALT 14  ALKPHOS 65  BILITOT 1.8*    PROT 6.6  ALBUMIN 3.5    Recent Labs Lab 05/23/16 1752  LIPASE 13   Urine analysis:    Component Value Date/Time   COLORURINE YELLOW 05/23/2016 2027   APPEARANCEUR CLEAR 05/23/2016 2027   LABSPEC <1.005 (L) 05/23/2016 2027   PHURINE 6.5 05/23/2016 2027   GLUCOSEU NEGATIVE 05/23/2016 2027   HGBUR NEGATIVE 05/23/2016 2027   BILIRUBINUR NEGATIVE 05/23/2016 2027   KETONESUR NEGATIVE 05/23/2016 2027   PROTEINUR NEGATIVE 05/23/2016 2027   NITRITE NEGATIVE 05/23/2016 2027   LEUKOCYTESUR TRACE (A) 05/23/2016 2027    Recent Results (from the past 240 hour(s))  Blood Culture (routine x 2)     Status: None (Preliminary result)   Collection Time: 05/23/16  5:52 PM  Result Value Ref Range Status   Specimen Description BLOOD RIGHT HAND  Final   Special Requests BOTTLES DRAWN AEROBIC AND ANAEROBIC [place] EACH  Final   Culture PENDING  Incomplete   Report Status PENDING  Incomplete  Blood Culture (routine x 2)     Status: None (Preliminary result)   Collection Time: 05/23/16  7:28 PM  Result Value Ref Range Status   Specimen Description BLOOD LEFT HAND  Final   Special Requests BOTTLES DRAWN AEROBIC ONLY 6CC ONLY  Final   Culture PENDING  Incomplete   Report Status PENDING  Incomplete     Radiological Exams on Admission: Ct Chest Wo Contrast  Result Date: 05/23/2016 CLINICAL DATA:  Chest pain EXAM: CT CHEST WITHOUT CONTRAST TECHNIQUE: Multidetector CT imaging of the chest was performed following the standard protocol without IV contrast. COMPARISON:  06/06/2014 FINDINGS: Cardiovascular: Moderate cardiac enlargement. Previous median sternotomy and mitral valve replacement. No pericardial effusion. Aortic atherosclerosis noted. Mediastinum/Nodes: The trachea appears patent and is midline.  Normal appearance of the esophagus. Multiple calcify scratch set numerous calcified and noncalcified lymph nodes are identified within the mediastinum. Noncalcified pre-vascular lymph node is enlarged  measuring 11 mm, image number 42 of series 2. Noncalcified sub- carinal lymph node is also enlarged measuring 1.9 cm. Noncalcified right paratracheal lymph node measures 1.3 cm, image 28 of series 2. Small axillary lymph nodes are identified. Lungs/Pleura: Small to moderate bilateral pleural effusions are identified left greater than right. Subsegmental atelectasis is identified within the left lower lobe. Diffuse bilateral ground-glass attenuation in interlobular septal thickening is noted. Upper Abdomen: No acute abnormality. Musculoskeletal: No chest wall mass or suspicious bone lesions identified. IMPRESSION: 1. Bilateral pleural effusions, interlobular septal thickening and diffuse ground-glass attenuation is noted. Findings are favored to represent marked pulmonary edema perhaps secondary to congestive heart failure. Other considerations include diffuse alveolar hemorrhage and ARDS. 2. Cardiac enlargement. 3. Calcified and noncalcified lymph nodes are identified. A similar appearance was found on CT from 06/06/2014. Findings may reflect sequelae of granulomatous inflammation or infection. Electronically Signed   By: Signa Kell M.D.   On: 05/23/2016 20:42   Dg Chest Port 1 View  Result Date: 05/23/2016 CLINICAL DATA:  Chest pain EXAM: PORTABLE CHEST 1 VIEW COMPARISON:  04/26/2016 FINDINGS: Previous median sternotomy and CABG procedure. There is a moderate right pleural effusion. Diffuse hazy lung opacities are identified throughout both lungs, compatible with marked pulmonary edema. IMPRESSION: 1. Suspect marked pulmonary edema perhaps secondary to CHF. Electronically Signed   By: Signa Kell M.D.   On: 05/23/2016 18:21    EKG: Independently reviewed. Sinus tachy no acute issues cxr reviewed with diffuse pulm opacities bilaterally Old chart reviewed Case discussed with EDP and ICU RN  Assessment/Plan 35 yo female with h/o diastolic CHF, MVR comes in with acute hypoxic respiratory failure    Principal Problem:   Acute on chronic diastolic CHF (congestive heart failure) (HCC)- suspect most of her hypoxia due to volume overload.  Place on chf pathway, iv lasix 40mg  iv q 12.  Pt says she does not think the bumex she is on works for her, she has been on this for a year.  Check cardiac echo in the am.  Obtain cardiology consult.  Serial enzymes.  Wean oxygen as tolerates.  Active Problems:   Acute respiratory failure with hypoxia (HCC)- think this is mostly due to her chf, but cannot r/o pna due to her elevated WBC and report of fever at home.   Will empirically cover with broad spectrum antibiotics until cx neg.      Cough with hemoptysis- hold anticoagulants and plavix.  Obtain pulm consult    Essential hypertension- noted   Postpartum complication pericarditis in 2009 with eventual needing MVR in 2015- noted   Normocytic anemia- check anemia panel   Admit to stepdown.    DVT prophylaxis:  scds  Code Status:   full Family Communication:   none Disposition Plan:   Per day team Consults called:   none Admission status:  admit   Kenlie Seki A MD [facility]  If 7PM-7AM, please contact night-coverage [email or web address] Password [email or web address]  05/23/2016, 11:31 PM

## 2016-05-23 NOTE — Progress Notes (Signed)
ANTIBIOTIC CONSULT NOTE-Preliminary  Pharmacy Consult for Vancomycin, Levaquin, Aztreonam Indication: sepsis  Allergies  Allergen Reactions  . Lisinopril Shortness Of Breath and Swelling    Throat swelling  . Penicillins Shortness Of Breath and Swelling    Has patient had a PCN reaction causing immediate rash, facial/tongue/throat swelling, SOB or lightheadedness with hypotension: Yes Has patient had a PCN reaction causing severe rash involving mucus membranes or skin necrosis: Yes Has patient had a PCN reaction that required hospitalization No Has patient had a PCN reaction occurring within the last 10 years: No If all of the above answers are "NO", then may proceed with Cephalosporin use.   . Bee Venom   . Contrast Media [Iodinated Diagnostic Agents]     difinity  . Shellfish Allergy     Patient Measurements: Height: 5\' 8"  (172.7 cm) Weight: 225 lb (102.1 kg) IBW/kg (Calculated) : 63.9  Vital Signs: Temp: 98.4 F (36.9 C) (09/10 1741) Temp Source: Temporal (09/10 1741) BP: 105/61 (09/10 2030) Pulse Rate: 124 (09/10 1900)  Labs:  Recent Labs  05/23/16 1752  WBC 21.9*  21.6*  HGB 9.0*  9.0*  PLT 318  282  CREATININE 0.73    Estimated Creatinine Clearance: 121.6 mL/min (by C-G formula based on SCr of 0.8 mg/dL).  No results for input(s): VANCOTROUGH, VANCOPEAK, VANCORANDOM, GENTTROUGH, GENTPEAK, GENTRANDOM, TOBRATROUGH, TOBRAPEAK, TOBRARND, AMIKACINPEAK, AMIKACINTROU, AMIKACIN in the last 72 hours.   Microbiology: No results found for this or any previous visit (from the past 720 hour(s)).  Medical History: Past Medical History:  Diagnosis Date  . CHF (congestive heart failure) (HCC)   . Congestive heart failure (HCC)   . History of open heart surgery   . Hypertension   . Mitral valve disease    mitral regurgitation    Medications:   (Not in a hospital admission)  Assessment: 35yo obese female.  Good renal fxn.  Asked to start Vancomycin,  Aztreonam, and Levaquin for sepsis.  Goal of Therapy:  Vancomycin trough level 15-20 mcg/ml  Plan:  Preliminary review of pertinent patient information completed.  Protocol will be initiated with a one-time dose(s) of Vancomycin 1000mg , Aztreonam 2gm, and Levaquin 750mg .   Admission orders signed and held.  Jeani HawkingAnnie Penn clinical pharmacist will complete review during morning rounds to assess patient and finalize treatment regimen.  Valrie HartHall, Liviana Mills A, RPH 05/23/2016,9:20 PM

## 2016-05-23 NOTE — ED Notes (Signed)
Patient transported to CT 

## 2016-05-23 NOTE — ED Notes (Signed)
Troponin 0.04 - Dr. Mellody LifeZackowsi made aware.

## 2016-05-24 ENCOUNTER — Inpatient Hospital Stay (HOSPITAL_COMMUNITY): Payer: Medicaid Other

## 2016-05-24 DIAGNOSIS — I509 Heart failure, unspecified: Secondary | ICD-10-CM

## 2016-05-24 DIAGNOSIS — D72829 Elevated white blood cell count, unspecified: Secondary | ICD-10-CM | POA: Diagnosis present

## 2016-05-24 DIAGNOSIS — I5033 Acute on chronic diastolic (congestive) heart failure: Secondary | ICD-10-CM

## 2016-05-24 LAB — ECHOCARDIOGRAM COMPLETE
CHL CUP MV DEC (S): 375
CHL CUP MV M VEL: 184
CHL CUP RV SYS PRESS: 55 mmHg
CHL CUP TV REG PEAK VELOCITY: 360 cm/s
EERAT: 16.05
EWDT: 375 ms
FS: 27 % — AB (ref 28–44)
HEIGHTINCHES: 64 in
IV/PV OW: 0.64
LA ID, A-P, ES: 45 mm
LA diam index: 2.15 cm/m2
LA vol A4C: 78.8 ml
LA vol index: 35.9 mL/m2
LA vol: 75.1 mL
LEFT ATRIUM END SYS DIAM: 45 mm
LV E/e' medial: 16.05
LV E/e'average: 16.05
LV PW d: 17.7 mm — AB (ref 0.6–1.1)
LV TDI E'LATERAL: 11.9
LV TDI E'MEDIAL: 4.57
LVELAT: 11.9 cm/s
MV Annulus VTI: 58.1 cm
MV pk A vel: 174 m/s
MV pk E vel: 191 m/s
MVAP: 2.65 cm2
MVG: 16 mmHg
MVPG: 15 mmHg
MVSPHT: 83 ms
TAPSE: 12.1 mm
TR max vel: 360 cm/s
WEIGHTICAEL: 3721.36 [oz_av]

## 2016-05-24 LAB — TROPONIN I
TROPONIN I: 0.03 ng/mL — AB (ref <0.03)
TROPONIN I: 0.04 ng/mL — AB (ref <0.03)

## 2016-05-24 LAB — LACTIC ACID, PLASMA: LACTIC ACID, VENOUS: 0.9 mmol/L (ref 0.5–1.9)

## 2016-05-24 LAB — PROCALCITONIN: PROCALCITONIN: 1.61 ng/mL

## 2016-05-24 LAB — BASIC METABOLIC PANEL
BUN: 9 mg/dL (ref 6–20)
CHLORIDE: 106 mmol/L (ref 101–111)
CO2: 24 mmol/L (ref 22–32)
CREATININE: 0.85 mg/dL (ref 0.44–1.00)
Calcium: 7.7 mg/dL — ABNORMAL LOW (ref 8.9–10.3)
GFR calc Af Amer: >60 mL/min (ref >60)
GFR calc non Af Amer: >60 mL/min (ref >60)
Glucose, Bld: 103 mg/dL — ABNORMAL HIGH (ref 65–99)
POTASSIUM: 3.8 mmol/L (ref 3.5–5.1)
SODIUM: 132 mmol/L — AB (ref 135–145)

## 2016-05-24 LAB — VITAMIN B12: Vitamin B-12: 204 pg/mL (ref 180–914)

## 2016-05-24 LAB — RETICULOCYTES
RBC.: 3.52 MIL/uL — AB (ref 3.87–5.11)
RETIC COUNT ABSOLUTE: 81 10*3/uL (ref 19.0–186.0)
RETIC CT PCT: 2.3 % (ref 0.4–3.1)

## 2016-05-24 LAB — IRON AND TIBC
IRON: 6 ug/dL — AB (ref 28–170)
Saturation Ratios: 3 % — ABNORMAL LOW (ref 10.4–31.8)
TIBC: 207 ug/dL — ABNORMAL LOW (ref 250–450)
UIBC: 201 ug/dL

## 2016-05-24 LAB — FERRITIN: FERRITIN: 63 ng/mL (ref 11–307)

## 2016-05-24 LAB — FOLATE: Folate: 19.6 ng/mL (ref >5.9)

## 2016-05-24 LAB — MRSA PCR SCREENING: MRSA BY PCR: POSITIVE — AB

## 2016-05-24 MED ORDER — CHLORHEXIDINE GLUCONATE CLOTH 2 % EX PADS
6.0000 | MEDICATED_PAD | Freq: Every day | CUTANEOUS | Status: DC
  Administered 2016-05-24 – 2016-05-27 (×3): 6 via TOPICAL

## 2016-05-24 MED ORDER — LEVOFLOXACIN IN D5W 750 MG/150ML IV SOLN
750.0000 mg | INTRAVENOUS | Status: DC
  Administered 2016-05-24 – 2016-05-26 (×3): 750 mg via INTRAVENOUS
  Filled 2016-05-24 (×3): qty 150

## 2016-05-24 MED ORDER — MUPIROCIN 2 % EX OINT
1.0000 "application " | TOPICAL_OINTMENT | Freq: Two times a day (BID) | CUTANEOUS | Status: DC
  Administered 2016-05-24 – 2016-05-27 (×7): 1 via NASAL
  Filled 2016-05-24 (×2): qty 22

## 2016-05-24 NOTE — Consult Note (Signed)
Primary cardiologist: Theresa Crawford Consulting cardiologist: Theresa Crawford Requesting physician: Theresa Crawford Indication: SOB  Clinical Summary Theresa Crawford is a 35 y.o.female history of chronic diastolic heart failure, mitral regurgitation with previous repair, pulmonary HTN by echo, HTN, admitted with SOB, cough, fever, and hemoptysis. . Reports dry weight 220lbs at home, increased to 235 lbs. Her SOB symptoms are improving with diuresis. She reports compliance with her home bumex 1mg  bid.    [facility] echo 07/2015: LVEF 55-60%, no WMAs, mod LAE, mild MR, mild to mod TR, PASP 63. Diastolic function not described, however E/e' consistent with elevated LA pressures and normal decel time consistent with grade II diastolic dysfunction. There is a reported MV mean gradient of 18.  CXRpulmonary edema CT chest: bilateral pleural effusions, diffuse groundglass opacities, cardiomegaly, mediastinal lymphadenopathy  WBC 21.6, Hgb 9, Plt 282, BNP 642, lactic acid 1.1, Trop 0.04 and flat ABG: 7.4/33/73/24 EKG sinus tachycardia      Allergies  Allergen Reactions  . Lisinopril Shortness Of Breath and Swelling    Throat swelling  . Penicillins Shortness Of Breath and Swelling    Has patient had a PCN reaction causing immediate rash, facial/tongue/throat swelling, SOB or lightheadedness with hypotension: Yes Has patient had a PCN reaction causing severe rash involving mucus membranes or skin necrosis: Yes Has patient had a PCN reaction that required hospitalization No Has patient had a PCN reaction occurring within the last 10 years: No If all of the above answers are "NO", then may proceed with Cephalosporin use.   . Bee Venom   . Contrast Media [Iodinated Diagnostic Agents]     difinity  . Shellfish Allergy     Medications Scheduled Medications: . amLODipine  5 mg Oral Daily  . aztreonam  1 g Intravenous Q8H  . citalopram  40 mg Oral BID  . cyclobenzaprine  10 mg Oral TID   . docusate sodium  100 mg Oral Daily  . ferrous sulfate  325 mg Oral BID WC  . furosemide  40 mg Intravenous Q12H  . levofloxacin (LEVAQUIN) IV  750 mg Intravenous Q24H  . losartan  50 mg Oral BID  . metoprolol succinate  50 mg Oral Daily  . QUEtiapine  25 mg Oral BID  . sodium chloride flush  3 mL Intravenous Q12H  . spironolactone  25 mg Oral Daily  . vancomycin  1,000 mg Intravenous Q8H     Infusions:     PRN Medications:  sodium chloride, acetaminophen, ondansetron (ZOFRAN) IV, sodium chloride flush   Past Medical History:  Diagnosis Date  . CHF (congestive heart failure) (HCC)   . Congestive heart failure (HCC)   . History of open heart surgery   . Hypertension   . Mitral valve disease    mitral regurgitation    Past Surgical History:  Procedure Laterality Date  . ABDOMINAL HYSTERECTOMY    . CESAREAN SECTION     X's 2  . MITRAL VALVE REPAIR    . open heart surgery    . PARTIAL HYSTERECTOMY  2011   PID w/problem with fallopian tubes    Family History  Problem Relation Age of Onset  . Heart failure Father     just received LVAD  . Heart disease Paternal Grandfather     stent placement    Social History Theresa Crawford reports that she has been smoking Cigarettes.  She started smoking about 16 years ago. She has been smoking about 0.25 packs per day.  She has never used smokeless tobacco. Theresa Crawford reports that she does not drink alcohol.  Review of Systems CONSTITUTIONAL: No weight loss, fever, chills, weakness or fatigue.  HEENT: Eyes: No visual loss, blurred vision, double vision or yellow sclerae. No hearing loss, sneezing, congestion, runny nose or sore throat.  SKIN: No rash or itching.  CARDIOVASCULAR: No chest pain, chest pressure or chest discomfort. No palpitations or edema.  RESPIRATORY: + SOB, cough GASTROINTESTINAL: No anorexia, nausea, vomiting or diarrhea. No abdominal pain or blood.  GENITOURINARY: no polyuria, no dysuria NEUROLOGICAL: No  headache, dizziness, syncope, paralysis, ataxia, numbness or tingling in the extremities. No change in bowel or bladder control.  MUSCULOSKELETAL: No muscle, back pain, joint pain or stiffness.  HEMATOLOGIC: No anemia, bleeding or bruising.  LYMPHATICS: No enlarged nodes. No history of splenectomy.  PSYCHIATRIC: No history of depression or anxiety.      Physical Examination Blood pressure 106/63, pulse (!) 107, temperature 97.9 F (36.6 C), temperature source Oral, resp. rate (!) 27, height 5\' 4"  (1.626 m), weight 232 lb 9.4 oz (105.5 kg), last menstrual period 04/19/2016, SpO2 99 %. No intake or output data in the 24 hours ending 05/24/16 0816  HEENT: sclera clear, throat clear  Cardiovascular: regular, tachty 105, 2/6 systolic murmur at apex,. JVD to angle of jaw  Respiratory: CTAB  GI: abdomen soft, Nt, ND  MSK: no LE edema  Neuro: no focal deficits  Psych: appropriate affect   Lab Results  Basic Metabolic Panel:  Recent Labs Lab 05/23/16 1752 05/24/16 0544  NA 130* 132*  K 4.0 3.8  CL 100* 106  CO2 21* 24  GLUCOSE 113* 103*  BUN 9 9  CREATININE 0.73 0.85  CALCIUM 8.5* 7.7*    Liver Function Tests:  Recent Labs Lab 05/23/16 1752  AST 19  ALT 14  ALKPHOS 65  BILITOT 1.8*  PROT 6.6  ALBUMIN 3.5    CBC:  Recent Labs Lab 05/23/16 1752  WBC 21.9*  21.6*  NEUTROABS 18.2*  HGB 9.0*  9.0*  HCT 27.9*  27.9*  MCV 79.7  79.9  PLT 318  282    Cardiac Enzymes:  Recent Labs Lab 05/24/16 0059 05/24/16 0544  TROPONINI 0.04* 0.03*    BNP: Invalid input(s): POCBNP    Impression/Recommendations 1. Acute on chronic diastolic HF - repeat echo pending. [facility] shows normal LVEF, grade II diastolic dysfunction.  - I/Os incomplete this admission. Weight down from 235 to 232, he dry weight looks to be close to 220-225.  - she is on lasix 40mg  IV bid, renal function remains stable with diuresis. - continue IV diuretics  today  2. Mitral valve repair - f/u repeat echo, interestingly the [facility] echo reports a mean gradient of 18 but does not report any stenosis in the report - she reports repair done at [facility] in [place] July [place], we will request records   3. Pulmonary hypertension - PASP of 63 by echo in 2016 - VQ scan 07/2015 was normal. - f/u repeat echo. Potential etiologies include left sided heart disease, obesity hypovent, OSA. She has an outpatient study pending. She mentions possible history of sarcoid, but she is unsure. CT chest does have some mediastinal adenopathy.  - likely will need repeat PASP check once euvolemic.   4. Hypotension - hold home bp meds  It also remains unclear why she is on plavix at home. We will f/u records from [place].   Christiane HaJonathan  Harl Bowie, M.D.

## 2016-05-24 NOTE — Progress Notes (Signed)
Records from [facility] in [place] have been obtained and placed on the chart Per MD request.

## 2016-05-24 NOTE — Consult Note (Signed)
Consult requested by: [facility] Consult requested for respiratory failure:  HPI: This is a 35 year old who has a history of diastolic heart failure related to mitral valve insufficiency. She was in her usual state of fair health when she developed increasing shortness of breath cough productive of discolored sputum mixed with hemoptysis. She's felt to have congestive heart failure but she's been treated for infection as well considering elevated white blood cell count and the purulent sputum. She says she feels better. She's not as short of breath.  Past Medical History:  Diagnosis Date  . CHF (congestive heart failure) (HCC)   . Congestive heart failure (HCC)   . History of open heart surgery   . Hypertension   . Mitral valve disease    mitral regurgitation     Family History  Problem Relation Age of Onset  . Heart failure Father     just received LVAD  . Heart disease Paternal Grandfather     stent placement     Social History   Social History  . Marital status: Unknown    Spouse name: N/A  . Number of children: N/A  . Years of education: N/A   Social History Main Topics  . Smoking status: Current Every Day Smoker    Packs/day: 0.25    Types: Cigarettes    Start date: 11/03/1999  . Smokeless tobacco: Never Used  . Alcohol use No  . Drug use: No  . Sexual activity: Not Asked   Other Topics Concern  . None   Social History Narrative  . None     ROS: No chest pain. No definite fever or chills. No nausea or vomiting. She has had leg swelling. Otherwise per the history and physical which I have reviewed    Objective: Vital signs in last 24 hours: Temp:  [97.9 F (36.6 C)-98.7 F (37.1 C)] 97.9 F (36.6 C) (09/11 0400) Pulse Rate:  [107-137] 107 (09/11 0030) Resp:  [13-38] 27 (09/11 0030) BP: (99-130)/(50-84) 106/63 (09/11 0030) SpO2:  [92 %-99 %] 99 % (09/11 0030) Weight:  [102.1 kg (225 lb)-106.9 kg (235 lb 10.8 oz)] 105.5 kg (232 lb 9.4 oz) (09/11  0400) Weight change:     Intake/Output from previous day: No intake/output data recorded.  PHYSICAL EXAM She is awake and alert. Her pupils are reactiveand throat are clear her mucous membranes are moist her neck is supple without masses. She still has some rales in the bases bilaterally but her chest is otherwise clear. Her heart is regular with tachycardia. Her abdomen is soft without masses. She has trace edema now. Central nervous system exam grossly intact  Lab Results: Basic Metabolic Panel:  Recent Labs  16/10/96 1752 05/24/16 0544  NA 130* 132*  K 4.0 3.8  CL 100* 106  CO2 21* 24  GLUCOSE 113* 103*  BUN 9 9  CREATININE 0.73 0.85  CALCIUM 8.5* 7.7*   Liver Function Tests:  Recent Labs  05/23/16 1752  AST 19  ALT 14  ALKPHOS 65  BILITOT 1.8*  PROT 6.6  ALBUMIN 3.5    Recent Labs  05/23/16 1752  LIPASE 13   No results for input(s): AMMONIA in the last 72 hours. CBC:  Recent Labs  05/23/16 1752  WBC 21.9*  21.6*  NEUTROABS 18.2*  HGB 9.0*  9.0*  HCT 27.9*  27.9*  MCV 79.7  79.9  PLT 318  282   Cardiac Enzymes:  Recent Labs  05/24/16 0059 05/24/16 0544  TROPONINI 0.04*  0.03*   BNP: No results for input(s): PROBNP in the last 72 hours. D-Dimer: No results for input(s): DDIMER in the last 72 hours. CBG: No results for input(s): GLUCAP in the last 72 hours. Hemoglobin A1C: No results for input(s): HGBA1C in the last 72 hours. Fasting Lipid Panel: No results for input(s): CHOL, HDL, LDLCALC, TRIG, CHOLHDL, LDLDIRECT in the last 72 hours. Thyroid Function Tests: No results for input(s): TSH, T4TOTAL, FREET4, T3FREE, THYROIDAB in the last 72 hours. Anemia Panel:  Recent Labs  05/24/16 0059  RETICCTPCT 2.3   Coagulation: No results for input(s): LABPROT, INR in the last 72 hours. Urine Drug Screen: Drugs of Abuse  No results found for: LABOPIA, COCAINSCRNUR, LABBENZ, AMPHETMU, THCU, LABBARB  Alcohol Level: No results for  input(s): ETH in the last 72 hours. Urinalysis:  Recent Labs  05/23/16 2027  COLORURINE YELLOW  LABSPEC <1.005*  PHURINE 6.5  GLUCOSEU NEGATIVE  HGBUR NEGATIVE  BILIRUBINUR NEGATIVE  KETONESUR NEGATIVE  PROTEINUR NEGATIVE  NITRITE NEGATIVE  LEUKOCYTESUR TRACE*   Misc. Labs:   ABGS:  Recent Labs  05/23/16 2120  PHART 7.447  PO2ART 73.0*  HCO3 24.0     MICROBIOLOGY: Recent Results (from the past 240 hour(s))  Blood Culture (routine x 2)     Status: None (Preliminary result)   Collection Time: 05/23/16  5:52 PM  Result Value Ref Range Status   Specimen Description BLOOD RIGHT HAND  Final   Special Requests BOTTLES DRAWN AEROBIC AND ANAEROBIC [facility] EACH  Final   Culture PENDING  Incomplete   Report Status PENDING  Incomplete  Blood Culture (routine x 2)     Status: None (Preliminary result)   Collection Time: 05/23/16  7:28 PM  Result Value Ref Range Status   Specimen Description BLOOD LEFT HAND  Final   Special Requests BOTTLES DRAWN AEROBIC ONLY 6CC ONLY  Final   Culture PENDING  Incomplete   Report Status PENDING  Incomplete  MRSA PCR Screening     Status: Abnormal   Collection Time: 05/23/16 11:45 PM  Result Value Ref Range Status   MRSA by PCR POSITIVE (A) NEGATIVE Final    Comment:        The GeneXpert MRSA Assay (FDA approved for NASAL specimens only), is one component of a comprehensive MRSA colonization surveillance program. It is not intended to diagnose MRSA infection nor to guide or monitor treatment for MRSA infections. RESULT CALLED TO, READ BACK BY AND VERIFIED WITH: STOPAGL,A @ 0211 ON 05/24/16 BY JUW     Studies/Results: Ct Chest Wo Contrast  Result Date: 05/23/2016 CLINICAL DATA:  Chest pain EXAM: CT CHEST WITHOUT CONTRAST TECHNIQUE: Multidetector CT imaging of the chest was performed following the standard protocol without IV contrast. COMPARISON:  06/06/2014 FINDINGS: Cardiovascular: Moderate cardiac enlargement. Previous median  sternotomy and mitral valve replacement. No pericardial effusion. Aortic atherosclerosis noted. Mediastinum/Nodes: The trachea appears patent and is midline. Normal appearance of the esophagus. Multiple calcify scratch set numerous calcified and noncalcified lymph nodes are identified within the mediastinum. Noncalcified pre-vascular lymph node is enlarged measuring 11 mm, image number 42 of series 2. Noncalcified sub- carinal lymph node is also enlarged measuring 1.9 cm. Noncalcified right paratracheal lymph node measures 1.3 cm, image 28 of series 2. Small axillary lymph nodes are identified. Lungs/Pleura: Small to moderate bilateral pleural effusions are identified left greater than right. Subsegmental atelectasis is identified within the left lower lobe. Diffuse bilateral ground-glass attenuation in interlobular septal thickening is noted. Upper Abdomen: No  acute abnormality. Musculoskeletal: No chest wall mass or suspicious bone lesions identified. IMPRESSION: 1. Bilateral pleural effusions, interlobular septal thickening and diffuse ground-glass attenuation is noted. Findings are favored to represent marked pulmonary edema perhaps secondary to congestive heart failure. Other considerations include diffuse alveolar hemorrhage and ARDS. 2. Cardiac enlargement. 3. Calcified and noncalcified lymph nodes are identified. A similar appearance was found on CT from 06/06/2014. Findings may reflect sequelae of granulomatous inflammation or infection. Electronically Signed   By: Signa Kell M.D.   On: 05/23/2016 20:42   Dg Chest Port 1 View  Result Date: 05/23/2016 CLINICAL DATA:  Chest pain EXAM: PORTABLE CHEST 1 VIEW COMPARISON:  04/26/2016 FINDINGS: Previous median sternotomy and CABG procedure. There is a moderate right pleural effusion. Diffuse hazy lung opacities are identified throughout both lungs, compatible with marked pulmonary edema. IMPRESSION: 1. Suspect marked pulmonary edema perhaps secondary to  CHF. Electronically Signed   By: Signa Kell M.D.   On: 05/23/2016 18:21    Medications:  Prior to Admission:  Prescriptions Prior to Admission  Medication Sig Dispense Refill Last Dose  . amLODipine (NORVASC) 5 MG tablet Take 5 mg by mouth daily.  3 05/23/2016 at Unknown time  . bumetanide (BUMEX) 1 MG tablet Take 1 mg by mouth 2 (two) times daily.  3 05/23/2016 at Unknown time  . citalopram (CELEXA) 40 MG tablet Take 40 mg by mouth 2 (two) times daily.    05/23/2016 at Unknown time  . clopidogrel (PLAVIX) 75 MG tablet Take 75 mg by mouth daily.  3 05/23/2016 at Unknown time  . cyclobenzaprine (FLEXERIL) 10 MG tablet Take 1 tablet (10 mg total) by mouth 3 (three) times daily. 20 tablet 0 05/23/2016 at Unknown time  . ferrous sulfate 325 (65 FE) MG tablet Take 325 mg by mouth 2 (two) times daily with a meal.   05/23/2016 at Unknown time  . losartan (COZAAR) 50 MG tablet Take 50 mg by mouth 2 (two) times daily.  3 05/23/2016 at Unknown time  . metoprolol succinate (TOPROL-XL) 50 MG 24 hr tablet Take 50 mg by mouth daily.  3 05/23/2016 at 900a  . potassium chloride SA (K-DUR,KLOR-CON) 20 MEQ tablet Take 20 mEq by mouth 2 (two) times daily.  3 05/23/2016 at Unknown time  . QUEtiapine (SEROQUEL) 25 MG tablet Take 25 mg by mouth 2 (two) times daily.   05/23/2016 at Unknown time  . spironolactone (ALDACTONE) 25 MG tablet Take 25 mg by mouth daily.  3 05/23/2016 at Unknown time  . STOOL SOFTENER 100 MG capsule Take 100 mg by mouth daily.  3 05/23/2016 at Unknown time  . dexamethasone (DECADRON) 4 MG tablet Take 1 tablet (4 mg total) by mouth 2 (two) times daily with a meal. (Patient not taking: Reported on 05/23/2016) 12 tablet 0 Not Taking at Unknown time  . diclofenac (VOLTAREN) 75 MG EC tablet Take 1 tablet (75 mg total) by mouth 2 (two) times daily. (Patient not taking: Reported on 05/23/2016) 14 tablet 0 Not Taking at Unknown time  . meclizine (ANTIVERT) 25 MG tablet Take 1 tablet (25 mg total) by mouth 3  (three) times daily as needed for dizziness. (Patient not taking: Reported on 05/23/2016) 10 tablet 0 Not Taking at Unknown time  . predniSONE (DELTASONE) 20 MG tablet Take 3 po QD x 3d , then 2 po QD x 3d then 1 po QD x 3d (Patient not taking: Reported on 05/23/2016) 18 tablet 0 Not Taking at Unknown time  .  traMADol (ULTRAM) 50 MG tablet 1 or 2 po q6h prn pain (Patient not taking: Reported on 05/23/2016) 20 tablet 0 Not Taking at Unknown time   Scheduled: . amLODipine  5 mg Oral Daily  . aztreonam  1 g Intravenous Q8H  . citalopram  40 mg Oral BID  . cyclobenzaprine  10 mg Oral TID  . docusate sodium  100 mg Oral Daily  . ferrous sulfate  325 mg Oral BID WC  . furosemide  40 mg Intravenous Q12H  . levofloxacin (LEVAQUIN) IV  750 mg Intravenous Q24H  . losartan  50 mg Oral BID  . metoprolol succinate  50 mg Oral Daily  . QUEtiapine  25 mg Oral BID  . sodium chloride flush  3 mL Intravenous Q12H  . spironolactone  25 mg Oral Daily  . vancomycin  1,000 mg Intravenous Q8H   Continuous:  ZOX:WRUEAVPRN:sodium chloride, acetaminophen, ondansetron (ZOFRAN) IV, sodium chloride flush  Assesment: She has acute on chronic heart failure. She has acute hypoxic respiratory failure and she's being treated for pneumonia and I think that's appropriate now. She does look better. She says she's not coughing as much. Antibiotics could probably be discontinued or at least narrowed soon if she continues clinical improvement Principal Problem:   Acute on chronic diastolic CHF (congestive heart failure) (HCC) Active Problems:   Congestive heart failure (HCC)   Essential hypertension   Acute respiratory failure with hypoxia (HCC)   Cough with hemoptysis   Postpartum complication pericarditis in 2009 with eventual needing MVR in 2015   Normocytic anemia    Plan: Continue current treatments. No changes in medications. She seems to be improving with treatment  Thanks for allowing me to see her with you    LOS: 1  day   Dreana Britz L 05/24/2016, 8:07 AM

## 2016-05-24 NOTE — Care Management (Signed)
Attempted to see pt for consult. Pt sleeping family at bedside, will not wake pt at this time. Will attempt visit again tomorrow.

## 2016-05-24 NOTE — Progress Notes (Signed)
PROGRESS NOTE    Theresa Crawford  VOZ:[id number] DOB: Dec 15, 1980 DOA: 05/23/2016 PCP: Jerrell Belfast, [name]    Brief Narrative:  35 year-old female with a hx of diastolic CHF with a mitral valve repair 2015, HTN, and obesity presented with complaints of SOB with hemoptysis. While in the ED, she was noted to be hypokalemic, leukopenic, anemic, and, tachycardic. She was noted to have an elevated BNP of 642. CXR showed suspect marked pulmonary edema perhaps secondary to CHF. Chest CT revealed bilateral pleural effusions, interlobular septal thickening and diffuse ground-glass attenuation is noted. She was admitted for further evaluation of CHF.   Assessment & Plan:   Principal Problem:   Acute on chronic diastolic CHF (congestive heart failure) (HCC) Active Problems:   Congestive heart failure (HCC)   Essential hypertension   Acute respiratory failure with hypoxia (HCC)   Cough with hemoptysis   Postpartum complication pericarditis in 2009 with eventual needing MVR in 2015   Normocytic anemia  1. Acute on chronic diastolic CHF. She has an elevated BNP of 642. She reports SOB and appears to be in volume overload. CXR showed suspect marked pulmonary edema perhaps secondary to CHF. Echo pending. Continue IV lasix and wean oxygen as tolerated. Cardiology following. Unclear as to why she is on plavix. Prior records requested. 2. Acute respiratory failure with hypoxia. Secondary to CHF. She is requiring 2L of oxygen. Wean oxygen off as tolerated.  3. Possible pneumonia. Patient reports fever at home, cough and has a leukocytosis. She has been started on antibiotics. She has been afebrile since coming to the hospital. If remains stable overnight, have low threshold to discontinue antibiotics tomorrow. 4. Leukocytosis. Patient reports being on decadron last week for back pain. She completed this steroids course approximately 5 days ago. It is possible this may be contributing to leukocytosis. This may  have also contributed to increase in edema and precipitated chf exacerbation. Continue to monitor. 5. Cough with hemoptysis. Possibly related to pneumonia/chf. Pulmonology has been consulted. 6. Essential HTN. Blood pressure is running low. Will discontinue amlodipine and losartan. Place holding parameters on BB. 7. Normocytic anemia.  Hgb 9.0 on admission. Continue to monitor. Hemoptysis is reported to have resolved. 8. Obesity. Noted.    DVT prophylaxis: SCDs Code Status: Full  Family Communication: No family bedside Disposition Plan: Discharge home once improved    Consultants:   Pulmonology  Cardiology   Procedures:   Echo pending   Antimicrobials:  Levaquin 9/11>>   Subjective: Overall breathing improving. No further hemoptysis since admission. Cough is better.  Objective: Vitals:   05/23/16 2345 05/24/16 0015 05/24/16 0030 05/24/16 0400  BP: 99/62 99/64 106/63   Pulse: (!) 110 (!) 107 (!) 107   Resp: 13 (!) 29 (!) 27   Temp:    97.9 F (36.6 C)  TempSrc:    Oral  SpO2: 98% 96% 99%   Weight:    105.5 kg (232 lb 9.4 oz)  Height:       No intake or output data in the 24 hours ending 05/24/16 0611 Filed Weights   05/23/16 1742 05/23/16 2313 05/24/16 0400  Weight: 102.1 kg (225 lb) 106.9 kg (235 lb 10.8 oz) 105.5 kg (232 lb 9.4 oz)    Examination:  General exam: Appears calm and comfortable  Respiratory system: Clear to auscultation. Respiratory effort normal. Cardiovascular system: S1 & S2 heard, RRR. No JVD, murmurs, rubs, gallops or clicks. No pedal edema. Gastrointestinal system: Abdomen is nondistended, soft and  nontender. No organomegaly or masses felt. Normal bowel sounds heard. Central nervous system: Alert and oriented. No focal neurological deficits. Extremities: Symmetric 5 x 5 power. Skin: No rashes, lesions or ulcers Psychiatry: Judgement and insight appear normal. Mood & affect appropriate.     Data Reviewed: I have personally reviewed  following labs and imaging studies  CBC:  Recent Labs Lab 05/23/16 1752  WBC 21.9*  21.6*  NEUTROABS 18.2*  HGB 9.0*  9.0*  HCT 27.9*  27.9*  MCV 79.7  79.9  PLT 318  282   Basic Metabolic Panel:  Recent Labs Lab 05/23/16 1752  NA 130*  K 4.0  CL 100*  CO2 21*  GLUCOSE 113*  BUN 9  CREATININE 0.73  CALCIUM 8.5*   GFR: Estimated Creatinine Clearance: 115.1 mL/min (by C-G formula based on SCr of 0.8 mg/dL). Liver Function Tests:  Recent Labs Lab 05/23/16 1752  AST 19  ALT 14  ALKPHOS 65  BILITOT 1.8*  PROT 6.6  ALBUMIN 3.5    Recent Labs Lab 05/23/16 1752  LIPASE 13   No results for input(s): AMMONIA in the last 168 hours. Coagulation Profile: No results for input(s): INR, PROTIME in the last 168 hours. Cardiac Enzymes:  Recent Labs Lab 05/24/16 0059  TROPONINI 0.04*   BNP (last 3 results) No results for input(s): PROBNP in the last 8760 hours. HbA1C: No results for input(s): HGBA1C in the last 72 hours. CBG: No results for input(s): GLUCAP in the last 168 hours. Lipid Profile: No results for input(s): CHOL, HDL, LDLCALC, TRIG, CHOLHDL, LDLDIRECT in the last 72 hours. Thyroid Function Tests: No results for input(s): TSH, T4TOTAL, FREET4, T3FREE, THYROIDAB in the last 72 hours. Anemia Panel:  Recent Labs  05/24/16 0059  RETICCTPCT 2.3   Sepsis Labs:  Recent Labs Lab 05/23/16 1752 05/24/16 0059  PROCALCITON  --  1.61  LATICACIDVEN 1.1 0.9    Recent Results (from the past 240 hour(s))  Blood Culture (routine x 2)     Status: None (Preliminary result)   Collection Time: 05/23/16  5:52 PM  Result Value Ref Range Status   Specimen Description BLOOD RIGHT HAND  Final   Special Requests BOTTLES DRAWN AEROBIC AND ANAEROBIC [facility] EACH  Final   Culture PENDING  Incomplete   Report Status PENDING  Incomplete  Blood Culture (routine x 2)     Status: None (Preliminary result)   Collection Time: 05/23/16  7:28 PM  Result Value Ref Range  Status   Specimen Description BLOOD LEFT HAND  Final   Special Requests BOTTLES DRAWN AEROBIC ONLY 6CC ONLY  Final   Culture PENDING  Incomplete   Report Status PENDING  Incomplete  MRSA PCR Screening     Status: Abnormal   Collection Time: 05/23/16 11:45 PM  Result Value Ref Range Status   MRSA by PCR POSITIVE (A) NEGATIVE Final    Comment:        The GeneXpert MRSA Assay (FDA approved for NASAL specimens only), is one component of a comprehensive MRSA colonization surveillance program. It is not intended to diagnose MRSA infection nor to guide or monitor treatment for MRSA infections. RESULT CALLED TO, READ BACK BY AND VERIFIED WITH: STOPAGL,A @ 0211 ON 05/24/16 BY JUW          Radiology Studies: Ct Chest Wo Contrast  Result Date: 05/23/2016 CLINICAL DATA:  Chest pain EXAM: CT CHEST WITHOUT CONTRAST TECHNIQUE: Multidetector CT imaging of the chest was performed following the standard protocol without  IV contrast. COMPARISON:  06/06/2014 FINDINGS: Cardiovascular: Moderate cardiac enlargement. Previous median sternotomy and mitral valve replacement. No pericardial effusion. Aortic atherosclerosis noted. Mediastinum/Nodes: The trachea appears patent and is midline. Normal appearance of the esophagus. Multiple calcify scratch set numerous calcified and noncalcified lymph nodes are identified within the mediastinum. Noncalcified pre-vascular lymph node is enlarged measuring 11 mm, image number 42 of series 2. Noncalcified sub- carinal lymph node is also enlarged measuring 1.9 cm. Noncalcified right paratracheal lymph node measures 1.3 cm, image 28 of series 2. Small axillary lymph nodes are identified. Lungs/Pleura: Small to moderate bilateral pleural effusions are identified left greater than right. Subsegmental atelectasis is identified within the left lower lobe. Diffuse bilateral ground-glass attenuation in interlobular septal thickening is noted. Upper Abdomen: No acute abnormality.  Musculoskeletal: No chest wall mass or suspicious bone lesions identified. IMPRESSION: 1. Bilateral pleural effusions, interlobular septal thickening and diffuse ground-glass attenuation is noted. Findings are favored to represent marked pulmonary edema perhaps secondary to congestive heart failure. Other considerations include diffuse alveolar hemorrhage and ARDS. 2. Cardiac enlargement. 3. Calcified and noncalcified lymph nodes are identified. A similar appearance was found on CT from 06/06/2014. Findings may reflect sequelae of granulomatous inflammation or infection. Electronically Signed   By: Signa Kellaylor  Stroud M.D.   On: 05/23/2016 20:42   Dg Chest Port 1 View  Result Date: 05/23/2016 CLINICAL DATA:  Chest pain EXAM: PORTABLE CHEST 1 VIEW COMPARISON:  04/26/2016 FINDINGS: Previous median sternotomy and CABG procedure. There is a moderate right pleural effusion. Diffuse hazy lung opacities are identified throughout both lungs, compatible with marked pulmonary edema. IMPRESSION: 1. Suspect marked pulmonary edema perhaps secondary to CHF. Electronically Signed   By: Signa Kellaylor  Stroud M.D.   On: 05/23/2016 18:21        Scheduled Meds: . amLODipine  5 mg Oral Daily  . aztreonam  1 g Intravenous Q8H  . citalopram  40 mg Oral BID  . cyclobenzaprine  10 mg Oral TID  . docusate sodium  100 mg Oral Daily  . ferrous sulfate  325 mg Oral BID WC  . furosemide  40 mg Intravenous Q12H  . losartan  50 mg Oral BID  . metoprolol succinate  50 mg Oral Daily  . QUEtiapine  25 mg Oral BID  . sodium chloride flush  3 mL Intravenous Q12H  . spironolactone  25 mg Oral Daily  . vancomycin  1,000 mg Intravenous Q8H   Continuous Infusions:    LOS: 1 day    Time spent: 25 minutes     Erick BlinksJehanzeb Benecio Kluger, MD [facility] If 7PM-7AM, please contact night-coverage [email or web address] Password [email or web address] 05/24/2016, 6:11 AM

## 2016-05-24 NOTE — Progress Notes (Signed)
Pharmacy Antibiotic Note  Theresa Crawford is a 35 y.o. female admitted on 05/23/2016 with pneumonia.  [facility]  Plan: Levaquin 750mg  IV q24hrs Monitor labs, renal fxn, c/s  Height: 5\' 4"  (162.6 cm) Weight: 232 lb 9.4 oz (105.5 kg) IBW/kg (Calculated) : 54.7  Temp (24hrs), Avg:98.3 F (36.8 C), Min:97.9 F (36.6 C), Max:98.7 F (37.1 C)   Recent Labs Lab 05/23/16 1752 05/24/16 0059 05/24/16 0544  WBC 21.9*  21.6*  --   --   CREATININE 0.73  --  0.85  LATICACIDVEN 1.1 0.9  --     Estimated Creatinine Clearance: 108.3 mL/min (by C-G formula based on SCr of 0.85 mg/dL).    Allergies  Allergen Reactions  . Lisinopril Shortness Of Breath and Swelling    Throat swelling  . Penicillins Shortness Of Breath and Swelling    Has patient had a PCN reaction causing immediate rash, facial/tongue/throat swelling, SOB or lightheadedness with hypotension: Yes Has patient had a PCN reaction causing severe rash involving mucus membranes or skin necrosis: Yes Has patient had a PCN reaction that required hospitalization No Has patient had a PCN reaction occurring within the last 10 years: No If all of the above answers are "NO", then may proceed with Cephalosporin use.   . Bee Venom   . Contrast Media [Iodinated Diagnostic Agents]     difinity  . Shellfish Allergy    Antimicrobials this admission: Levaquin 9/10 >>  Vanc 9/10 >> 9/11 Azactam 9/10>>9/11  Dose adjustments this admission:  Recent Results (from the past 240 hour(s))  Blood Culture (routine x 2)     Status: None (Preliminary result)   Collection Time: 05/23/16  5:52 PM  Result Value Ref Range Status   Specimen Description BLOOD RIGHT HAND  Final   Special Requests BOTTLES DRAWN AEROBIC AND ANAEROBIC 6CC EACH  Final   Culture NO GROWTH < 24 HOURS  Final   Report Status PENDING  Incomplete  Blood Culture (routine x 2)     Status: None (Preliminary result)   Collection Time:  05/23/16  7:28 PM  Result Value Ref Range Status   Specimen Description BLOOD LEFT HAND  Final   Special Requests BOTTLES DRAWN AEROBIC ONLY 6CC ONLY  Final   Culture NO GROWTH < 24 HOURS  Final   Report Status PENDING  Incomplete  Urine culture     Status: None (Preliminary result)   Collection Time: 05/23/16  8:27 PM  Result Value Ref Range Status   Specimen Description URINE, CLEAN CATCH  Final   Special Requests NONE  Final   Culture PENDING  Incomplete   Report Status PENDING  Incomplete  MRSA PCR Screening     Status: Abnormal   Collection Time: 05/23/16 11:45 PM  Result Value Ref Range Status   MRSA by PCR POSITIVE (A) NEGATIVE Final    Comment:        The GeneXpert MRSA Assay (FDA approved for NASAL specimens only), is one component of a comprehensive MRSA colonization surveillance program. It is not intended to diagnose MRSA infection nor to guide or monitor treatment for MRSA infections. RESULT CALLED TO, READ BACK BY AND VERIFIED WITH: STOPAGL,A @ 0211 ON 05/24/16 BY JUW     Thank you for allowing pharmacy to be a part of this patient's care.  Valrie HartHall, Apolo Cutshaw A 05/24/2016 3:31 PM

## 2016-05-24 NOTE — Progress Notes (Signed)
*  PRELIMINARY RESULTS* Echocardiogram 2D Echocardiogram has been performed.  Jeryl Columbialliott, Marcille Barman 05/24/2016, 11:22 AM

## 2016-05-25 DIAGNOSIS — D509 Iron deficiency anemia, unspecified: Secondary | ICD-10-CM

## 2016-05-25 DIAGNOSIS — J189 Pneumonia, unspecified organism: Secondary | ICD-10-CM | POA: Diagnosis present

## 2016-05-25 LAB — BASIC METABOLIC PANEL
Anion gap: 6 (ref 5–15)
BUN: 17 mg/dL (ref 6–20)
CHLORIDE: 103 mmol/L (ref 101–111)
CO2: 25 mmol/L (ref 22–32)
CREATININE: 0.87 mg/dL (ref 0.44–1.00)
Calcium: 8.6 mg/dL — ABNORMAL LOW (ref 8.9–10.3)
GFR calc Af Amer: >60 mL/min (ref >60)
GFR calc non Af Amer: >60 mL/min (ref >60)
GLUCOSE: 114 mg/dL — AB (ref 65–99)
Potassium: 3.9 mmol/L (ref 3.5–5.1)
Sodium: 134 mmol/L — ABNORMAL LOW (ref 135–145)

## 2016-05-25 LAB — CBC
HCT: 27.4 % — ABNORMAL LOW (ref 36.0–46.0)
Hemoglobin: 8.8 g/dL — ABNORMAL LOW (ref 12.0–15.0)
MCH: 25.6 pg — AB (ref 26.0–34.0)
MCHC: 32.1 g/dL (ref 30.0–36.0)
MCV: 79.7 fL (ref 78.0–100.0)
PLATELETS: 326 10*3/uL (ref 150–400)
RBC: 3.44 MIL/uL — AB (ref 3.87–5.11)
RDW: 17.8 % — AB (ref 11.5–15.5)
WBC: 12.3 10*3/uL — ABNORMAL HIGH (ref 4.0–10.5)

## 2016-05-25 LAB — URINE CULTURE: CULTURE: NO GROWTH

## 2016-05-25 LAB — PROCALCITONIN: Procalcitonin: 1.61 ng/mL

## 2016-05-25 MED ORDER — VITAMIN B-12 1000 MCG PO TABS
1000.0000 ug | ORAL_TABLET | Freq: Every day | ORAL | Status: DC
  Administered 2016-05-26 – 2016-05-27 (×2): 1000 ug via ORAL
  Filled 2016-05-25 (×2): qty 1

## 2016-05-25 MED ORDER — SODIUM CHLORIDE 0.9 % IV SOLN
510.0000 mg | Freq: Once | INTRAVENOUS | Status: AC
  Administered 2016-05-25: 510 mg via INTRAVENOUS
  Filled 2016-05-25: qty 17

## 2016-05-25 MED ORDER — SPIRONOLACTONE 25 MG PO TABS
12.5000 mg | ORAL_TABLET | Freq: Every day | ORAL | Status: DC
  Administered 2016-05-26 – 2016-05-27 (×2): 12.5 mg via ORAL
  Filled 2016-05-25 (×2): qty 1

## 2016-05-25 MED ORDER — METOPROLOL SUCCINATE ER 25 MG PO TB24
25.0000 mg | ORAL_TABLET | Freq: Every day | ORAL | Status: DC
  Administered 2016-05-26 – 2016-05-27 (×2): 25 mg via ORAL
  Filled 2016-05-25 (×2): qty 1

## 2016-05-25 MED ORDER — CYANOCOBALAMIN 1000 MCG/ML IJ SOLN
1000.0000 ug | Freq: Once | INTRAMUSCULAR | Status: AC
  Administered 2016-05-25: 1000 ug via INTRAMUSCULAR
  Filled 2016-05-25: qty 1

## 2016-05-25 NOTE — Progress Notes (Addendum)
Subjective:    SOB improving.   Objective:   Temp:  [97.1 F (36.2 C)-99.1 F (37.3 C)] 97.1 F (36.2 C) (09/12 0400) Pulse Rate:  [33-111] 89 (09/12 0700) Resp:  [15-37] 22 (09/12 0700) BP: (76-107)/(37-76) 87/46 (09/12 0700) SpO2:  [94 %-100 %] 100 % (09/12 0700) Weight:  [225 lb 12 oz (102.4 kg)] 225 lb 12 oz (102.4 kg) (09/12 0500) Last BM Date: 05/23/16  Filed Weights   05/23/16 2313 05/24/16 0400 05/25/16 0500  Weight: 235 lb 10.8 oz (106.9 kg) 232 lb 9.4 oz (105.5 kg) 225 lb 12 oz (102.4 kg)    Intake/Output Summary (Last 24 hours) at 05/25/16 0923 Last data filed at 05/24/16 1757  Gross per 24 hour  Intake              240 ml  Output                0 ml  Net              240 ml    Telemetry: SR and sinus tach  Exam:  General: NAD  HEENT: sclera clear, throat clear  Resp: mild crackles bilateral bases  Cardiac: regular, tach 105, 2/6 systolic murmur at apex, JVD to angle of jaw  GI: abdomen soft, NT, ND  MSK: no LE edema  Neuro: no focal deficits  Psych: appropriate affect  Lab Results:  Basic Metabolic Panel:  Recent Labs Lab 05/23/16 1752 05/24/16 0544 05/25/16 0345  NA 130* 132* 134*  K 4.0 3.8 3.9  CL 100* 106 103  CO2 21* 24 25  GLUCOSE 113* 103* 114*  BUN 9 9 17   CREATININE 0.73 0.85 0.87  CALCIUM 8.5* 7.7* 8.6*    Liver Function Tests:  Recent Labs Lab 05/23/16 1752  AST 19  ALT 14  ALKPHOS 65  BILITOT 1.8*  PROT 6.6  ALBUMIN 3.5    CBC:  Recent Labs Lab 05/23/16 1752 05/25/16 0345  WBC 21.9*  21.6* 12.3*  HGB 9.0*  9.0* 8.8*  HCT 27.9*  27.9* 27.4*  MCV 79.7  79.9 79.7  PLT 318  282 326    Cardiac Enzymes:  Recent Labs Lab 05/24/16 0059 05/24/16 0544 05/24/16 1120  TROPONINI 0.04* 0.03* <0.03    BNP: No results for input(s): PROBNP in the last 8760 hours.  Coagulation: No results for input(s): INR in the last 168 hours.  ECG:   Medications:   Scheduled Medications: .  Chlorhexidine Gluconate Cloth  6 each Topical Q0600  . citalopram  40 mg Oral BID  . cyclobenzaprine  10 mg Oral TID  . docusate sodium  100 mg Oral Daily  . ferrous sulfate  325 mg Oral BID WC  . furosemide  40 mg Intravenous Q12H  . levofloxacin (LEVAQUIN) IV  750 mg Intravenous Q24H  . metoprolol succinate  50 mg Oral Daily  . mupirocin ointment  1 application Nasal BID  . QUEtiapine  25 mg Oral BID  . sodium chloride flush  3 mL Intravenous Q12H  . spironolactone  25 mg Oral Daily     Infusions:     PRN Medications:  sodium chloride, acetaminophen, ondansetron (ZOFRAN) IV, sodium chloride flush     Assessment/Plan     1. Acute on chronic diastolic HF -  Echo from 2016 at [facility] shows normal LVEF, grade II diastolic dysfunction.  - I/Os incomplete this admission. Weight down from 235 to 232 yesterday and down to 225  reportedly today. - she is on lasix 40mg  IV bid, renal function remains stable with diuresis.Still with signs of fluid overload on exam, continue IV lasix today.     2. Mitral valve repair -she reports repair done at [facility] [place] around July [place] - some records received by remain incomplete. She had MV repair 03/27/13 with a 30 mm CE ring, along with excision of the posterior leaflet chordae and resuspension. Noted to have moderate mitral valve stenosis s/p repair, details remain unclear.  - echo this admit shows mean gradient across valve of 14 mmHg, morphology of valve poorly visualized. Eccentric TR may be underestimated.  - [facility]), this may be the etiology.   - she needs TEE to further evaluate mitral stenosis and tricuspid regurgitation. Borderline bp's this AM, still with signs of volume overload on exam today. Would defer testing today, reconsider tomorrow pending respiratory status and bp's.      3. Pulmonary hypertension - PASP of 63 by  echo in 2016. PASP this admit 40 by echo may be underestimated based on available TR waveform.  - VQ scan 07/2015 was normal. - f/u repeat echo. Potential etiologies include left sided heart disease, mitral stenosis, obesity hypovent, OSA. She has an outpatient study pending. She mentions possible history of sarcoid, but she is unsure. CT chest does have some mediastinal adenopathy. From brief available records records froum outside hospital she had hilar lypmhadenopathy that resolved, negative ACE levels.   4. Hypotension - holding some home bp meds, will decrease toprol to 25 mg and aldactone to 12.5mg  daily.   5. Pneumonia - abx per primary team    It also remains unclear why she is on plavix at home. We will f/u further records  [place].     Dina RichJonathan Chany Woolworth, M.D.     Addendum TEE scheduled for 245pm (first available) tomorrow.   Dominga FerryJ Emberlee Sortino MD

## 2016-05-25 NOTE — Progress Notes (Signed)
PROGRESS NOTE    Theresa Crawford  ZOX:[id number] DOB: October 21, 1980 DOA: 05/23/2016 PCP: Jerrell Belfast, [name]    Brief Narrative:  35 year-old female with a hx of diastolic CHF with a mitral valve repair 2015, HTN, and obesity presented with complaints of SOB with hemoptysis. While in the ED, she was noted to be hypokalemic, leukopenic, anemic, and, tachycardic. She was noted to have an elevated BNP of 642. CXR showed marked pulmonary edema perhaps secondary to CHF. Chest CT revealed bilateral pleural effusions, interlobular septal thickening and diffuse ground-glass attenuation is noted. She was admitted for further evaluation of CHF.   Assessment & Plan:   Principal Problem:   Acute on chronic diastolic CHF (congestive heart failure) (HCC) Active Problems:   Congestive heart failure (HCC)   Essential hypertension   Acute respiratory failure with hypoxia (HCC)   Cough with hemoptysis   Postpartum complication pericarditis in 2009 with eventual needing MVR in 2015   Normocytic anemia   Leukocytosis  1. Acute on chronic diastolic CHF. She has an elevated BNP of 642 on admission. She reports SOB and appeared to be in volume overload. CXR showed suspect marked pulmonary edema perhaps secondary to CHF. Echo revealed an EF of 55-60%. She has been diuresing well and appears to be approaching euvolemia. Cardiology was consulted and recommended TEE since valves were not well visualized on TTE.  Unclear as to why she is on plavix. Prior records requested. 2. Acute respiratory failure with hypoxia. Secondary to CHF. She is requiring 2L of oxygen. Wean oxygen off as tolerated.  3. Possible pneumonia. Patient reports fever at home, cough and has a leukocytosis. She has been started on antibiotics. She has been afebrile since coming to the hospital. Patient remained stable overnight and continues to improve. Plan is to complete 7 days of abx.  4. Leukocytosis. Patient reports being on decadron last week for  back pain. She completed this steroids course approximately 5 days ago. It is possible this may be contributing to leukocytosis. Steroids may have also contributed to increase in edema and precipitated chf exacerbation. Currently improving. Continue to monitor. 5. Cough with hemoptysis. Possibly related to pneumonia/CHF. Pulmonology has been consulted and agrees with current treatments. Plans are to treat with one week of abx. 6. Essential HTN. Blood pressure is running low. Will discontinue amlodipine and losartan. Place holding parameters on BB. 7. Iron deficiency anemia.  Hgb 9.0 on admission. Continue to monitor. Will give one dose of IV iron. Continue oral supplementation. She also has borderline low B12 level. Will give intramuscular injection and then start po supplements 8. Obesity. Noted.    DVT prophylaxis: SCDs  Code Status: Full  Family Communication: No family bedside Disposition Plan: Discharge home once improved.    Consultants:   Cardiology   Pulmonology   Procedures:   Echo   - Left ventricle: The cavity size was normal. Wall thickness was   increased in a pattern of mild LVH. Systolic function was normal.   The estimated ejection fraction was in the range of 55% to 60%.   Diastolic evaluation is limited in setting of mitral anular ring.   There is evidence suggesting elevated LA pressures. - Aortic valve: Valve area (VTI): 2.78 cm^2. Valve area (Vmax):   2.82 cm^2. - Mitral valve: The morphology of the mitral valve is poorly   visualized. There appears to be an anular ring present. There is   turbulent blood flow across the valve. There is a severe  gradient   across the mitral valve. Mean gradient 14 mmHg. - Left atrium: The atrium was moderately dilated. - Right ventricle: Systolic function was mildly reduced. - Right atrium: The atrium was mildly dilated. - Tricuspid valve: Difficult to grade severity of TR, very   eccentric posterior jet. There was  mild-moderate regurgitation. - Pulmonary arteries: Systolic pressure was moderately increased.   PA peak pressure: 40 mm Hg (S). PASP may be underestimated based   on available TR spectral Doppler. - Technically adequate study. - There is a severe gradient across the mitral valve. The valve   morphology is poorly visualized due to prior repair. The degree   of tricuspid regurgitation may also be underestimated based on   images. Recommend TEE to further evaluate.  Antimicrobials:   Levaquin 9/11 >>   Subjective: No shortness of breath or cough. Feeling better. Swelling is better.  Objective: Vitals:   05/25/16 0100 05/25/16 0300 05/25/16 0400 05/25/16 0500  BP: 107/63 107/61    Pulse: (!) 109 (!) 110    Resp: (!) 26 (!) 31    Temp:   97.1 F (36.2 C)   TempSrc:   Oral   SpO2: 100% 100%    Weight:    102.4 kg (225 lb 12 oz)  Height:        Intake/Output Summary (Last 24 hours) at 05/25/16 0644 Last data filed at 05/24/16 1757  Gross per 24 hour  Intake              240 ml  Output                0 ml  Net              240 ml   Filed Weights   05/23/16 2313 05/24/16 0400 05/25/16 0500  Weight: 106.9 kg (235 lb 10.8 oz) 105.5 kg (232 lb 9.4 oz) 102.4 kg (225 lb 12 oz)    Examination:  General exam: Appears calm and comfortable  Respiratory system: Clear to auscultation. Respiratory effort normal. Cardiovascular system: S1 & S2 heard, RRR. No JVD, murmurs, rubs, gallops or clicks. No pedal edema. Gastrointestinal system: Abdomen is nondistended, soft and nontender. No organomegaly or masses felt. Normal bowel sounds heard. Central nervous system: Alert and oriented. No focal neurological deficits. Extremities: Symmetric 5 x 5 power. Skin: No rashes, lesions or ulcers Psychiatry: Judgement and insight appear normal. Mood & affect appropriate.     Data Reviewed: I have personally reviewed following labs and imaging studies  CBC:  Recent Labs Lab 05/23/16 1752  05/25/16 0345  WBC 21.9*  21.6* 12.3*  NEUTROABS 18.2*  --   HGB 9.0*  9.0* 8.8*  HCT 27.9*  27.9* 27.4*  MCV 79.7  79.9 79.7  PLT 318  282 326   Basic Metabolic Panel:  Recent Labs Lab 05/23/16 1752 05/24/16 0544 05/25/16 0345  NA 130* 132* 134*  K 4.0 3.8 3.9  CL 100* 106 103  CO2 21* 24 25  GLUCOSE 113* 103* 114*  BUN 9 9 17   CREATININE 0.73 0.85 0.87  CALCIUM 8.5* 7.7* 8.6*   GFR: Estimated Creatinine Clearance: 104.1 mL/min (by C-G formula based on SCr of 0.87 mg/dL). Liver Function Tests:  Recent Labs Lab 05/23/16 1752  AST 19  ALT 14  ALKPHOS 65  BILITOT 1.8*  PROT 6.6  ALBUMIN 3.5    Recent Labs Lab 05/23/16 1752  LIPASE 13   No results for input(s): AMMONIA in the last  168 hours. Coagulation Profile: No results for input(s): INR, PROTIME in the last 168 hours. Cardiac Enzymes:  Recent Labs Lab 05/24/16 0059 05/24/16 0544 05/24/16 1120  TROPONINI 0.04* 0.03* <0.03   BNP (last 3 results) No results for input(s): PROBNP in the last 8760 hours. HbA1C: No results for input(s): HGBA1C in the last 72 hours. CBG: No results for input(s): GLUCAP in the last 168 hours. Lipid Profile: No results for input(s): CHOL, HDL, LDLCALC, TRIG, CHOLHDL, LDLDIRECT in the last 72 hours. Thyroid Function Tests: No results for input(s): TSH, T4TOTAL, FREET4, T3FREE, THYROIDAB in the last 72 hours. Anemia Panel:  Recent Labs  05/24/16 0059  VITAMINB12 204  FOLATE 19.6  FERRITIN 63  TIBC 207*  IRON 6*  RETICCTPCT 2.3   Sepsis Labs:  Recent Labs Lab 05/23/16 1752 05/24/16 0059 05/25/16 0345  PROCALCITON  --  1.61 1.61  LATICACIDVEN 1.1 0.9  --     Recent Results (from the past 240 hour(s))  Blood Culture (routine x 2)     Status: None (Preliminary result)   Collection Time: 05/23/16  5:52 PM  Result Value Ref Range Status   Specimen Description BLOOD RIGHT HAND  Final   Special Requests BOTTLES DRAWN AEROBIC AND ANAEROBIC 6CC EACH   Final   Culture NO GROWTH < 24 HOURS  Final   Report Status PENDING  Incomplete  Blood Culture (routine x 2)     Status: None (Preliminary result)   Collection Time: 05/23/16  7:28 PM  Result Value Ref Range Status   Specimen Description BLOOD LEFT HAND  Final   Special Requests BOTTLES DRAWN AEROBIC ONLY 6CC ONLY  Final   Culture NO GROWTH < 24 HOURS  Final   Report Status PENDING  Incomplete  Urine culture     Status: None (Preliminary result)   Collection Time: 05/23/16  8:27 PM  Result Value Ref Range Status   Specimen Description URINE, CLEAN CATCH  Final   Special Requests NONE  Final   Culture PENDING  Incomplete   Report Status PENDING  Incomplete  MRSA PCR Screening     Status: Abnormal   Collection Time: 05/23/16 11:45 PM  Result Value Ref Range Status   MRSA by PCR POSITIVE (A) NEGATIVE Final    Comment:        The GeneXpert MRSA Assay (FDA approved for NASAL specimens only), is one component of a comprehensive MRSA colonization surveillance program. It is not intended to diagnose MRSA infection nor to guide or monitor treatment for MRSA infections. RESULT CALLED TO, READ BACK BY AND VERIFIED WITH: STOPAGL,A @ 0211 ON 05/24/16 BY JUW          Radiology Studies: Ct Chest Wo Contrast  Result Date: 05/23/2016 CLINICAL DATA:  Chest pain EXAM: CT CHEST WITHOUT CONTRAST TECHNIQUE: Multidetector CT imaging of the chest was performed following the standard protocol without IV contrast. COMPARISON:  06/06/2014 FINDINGS: Cardiovascular: Moderate cardiac enlargement. Previous median sternotomy and mitral valve replacement. No pericardial effusion. Aortic atherosclerosis noted. Mediastinum/Nodes: The trachea appears patent and is midline. Normal appearance of the esophagus. Multiple calcify scratch set numerous calcified and noncalcified lymph nodes are identified within the mediastinum. Noncalcified pre-vascular lymph node is enlarged measuring 11 mm, image number 42 of  series 2. Noncalcified sub- carinal lymph node is also enlarged measuring 1.9 cm. Noncalcified right paratracheal lymph node measures 1.3 cm, image 28 of series 2. Small axillary lymph nodes are identified. Lungs/Pleura: Small to moderate bilateral pleural effusions are  identified left greater than right. Subsegmental atelectasis is identified within the left lower lobe. Diffuse bilateral ground-glass attenuation in interlobular septal thickening is noted. Upper Abdomen: No acute abnormality. Musculoskeletal: No chest wall mass or suspicious bone lesions identified. IMPRESSION: 1. Bilateral pleural effusions, interlobular septal thickening and diffuse ground-glass attenuation is noted. Findings are favored to represent marked pulmonary edema perhaps secondary to congestive heart failure. Other considerations include diffuse alveolar hemorrhage and ARDS. 2. Cardiac enlargement. 3. Calcified and noncalcified lymph nodes are identified. A similar appearance was found on CT from 06/06/2014. Findings may reflect sequelae of granulomatous inflammation or infection. Electronically Signed   By: Signa Kell M.D.   On: 05/23/2016 20:42   Dg Chest Port 1 View  Result Date: 05/23/2016 CLINICAL DATA:  Chest pain EXAM: PORTABLE CHEST 1 VIEW COMPARISON:  04/26/2016 FINDINGS: Previous median sternotomy and CABG procedure. There is a moderate right pleural effusion. Diffuse hazy lung opacities are identified throughout both lungs, compatible with marked pulmonary edema. IMPRESSION: 1. Suspect marked pulmonary edema perhaps secondary to CHF. Electronically Signed   By: Signa Kell M.D.   On: 05/23/2016 18:21        Scheduled Meds: . Chlorhexidine Gluconate Cloth  6 each Topical Q0600  . citalopram  40 mg Oral BID  . cyclobenzaprine  10 mg Oral TID  . docusate sodium  100 mg Oral Daily  . ferrous sulfate  325 mg Oral BID WC  . furosemide  40 mg Intravenous Q12H  . levofloxacin (LEVAQUIN) IV  750 mg Intravenous  Q24H  . metoprolol succinate  50 mg Oral Daily  . mupirocin ointment  1 application Nasal BID  . QUEtiapine  25 mg Oral BID  . sodium chloride flush  3 mL Intravenous Q12H  . spironolactone  25 mg Oral Daily   Continuous Infusions:    LOS: 2 days    Time spent: 25 minutes    Erick Blinks, MD [facility] If 7PM-7AM, please contact night-coverage [email or web address] Password [facility] 05/25/2016, 6:44 AM

## 2016-05-25 NOTE — Progress Notes (Signed)
Subjective: She says she feels better. She has no new complaints. She feels like her breathing is back close to baseline. She is coughing minimally. She's not coughing anything up.  Objective: Vital signs in last 24 hours: Temp:  [97.1 F (36.2 C)-99.1 F (37.3 C)] 97.1 F (36.2 C) (09/12 0400) Pulse Rate:  [33-111] 89 (09/12 0700) Resp:  [15-44] 22 (09/12 0700) BP: (76-107)/(31-76) 87/46 (09/12 0700) SpO2:  [94 %-100 %] 100 % (09/12 0700) Weight:  [102.4 kg (225 lb 12 oz)] 102.4 kg (225 lb 12 oz) (09/12 0500) Weight change: 0.341 kg (12 oz) Last BM Date: 05/23/16  Intake/Output from previous day: 09/11 0701 - 09/12 0700 In: 240 [P.O.:240] Out: -   PHYSICAL EXAM General appearance: alert, cooperative, no distress and moderately obese Resp: clear to auscultation bilaterally Cardio: I don't hear a gallop. GI: soft, non-tender; bowel sounds normal; no masses,  no organomegaly Extremities: extremities normal, atraumatic, no cyanosis or edema  Lab Results:  Results for orders placed or performed during the hospital encounter of 05/23/16 (from the past 48 hour(s))  Basic metabolic panel     Status: Abnormal   Collection Time: 05/23/16  5:52 PM  Result Value Ref Range   Sodium 130 (Crawford) 135 - 145 mmol/Crawford   Potassium 4.0 3.5 - 5.1 mmol/Crawford   Chloride 100 (Crawford) 101 - 111 mmol/Crawford   CO2 21 (Crawford) 22 - 32 mmol/Crawford   Glucose, Bld 113 (H) 65 - 99 mg/dL   BUN 9 6 - 20 mg/dL   Creatinine, Ser 0.73 0.44 - 1.00 mg/dL   Calcium 8.5 (Crawford) 8.9 - 10.3 mg/dL   GFR calc non Af Amer >60 >60 mL/min   GFR calc Af Amer >60 >60 mL/min    Comment: (NOTE) The eGFR has been calculated using the CKD EPI equation. This calculation has not been validated in all clinical situations. eGFR's persistently <60 mL/min signify possible Chronic Kidney Disease.    Anion gap 9 5 - 15  CBC     Status: Abnormal   Collection Time: 05/23/16  5:52 PM  Result Value Ref Range   WBC 21.6 (H) 4.0 - 10.5 K/uL   RBC 3.49 (Crawford) 3.87  - 5.11 MIL/uL   Hemoglobin 9.0 (Crawford) 12.0 - 15.0 g/dL   HCT 27.9 (Crawford) 36.0 - 46.0 %   MCV 79.9 78.0 - 100.0 fL   MCH 25.8 (Crawford) 26.0 - 34.0 pg   MCHC 32.3 30.0 - 36.0 g/dL   RDW 17.9 (H) 11.5 - 15.5 %   Platelets 282 150 - 400 K/uL  CBC WITH DIFFERENTIAL     Status: Abnormal   Collection Time: 05/23/16  5:52 PM  Result Value Ref Range   WBC 21.9 (H) 4.0 - 10.5 K/uL   RBC 3.50 (Crawford) 3.87 - 5.11 MIL/uL   Hemoglobin 9.0 (Crawford) 12.0 - 15.0 g/dL   HCT 27.9 (Crawford) 36.0 - 46.0 %   MCV 79.7 78.0 - 100.0 fL   MCH 25.7 (Crawford) 26.0 - 34.0 pg   MCHC 32.3 30.0 - 36.0 g/dL   RDW 17.7 (H) 11.5 - 15.5 %   Platelets 318 150 - 400 K/uL   Neutrophils Relative % 83 %   Neutro Abs 18.2 (H) 1.7 - 7.7 K/uL   Lymphocytes Relative 8 %   Lymphs Abs 1.8 0.7 - 4.0 K/uL   Monocytes Relative 9 %   Monocytes Absolute 1.9 (H) 0.1 - 1.0 K/uL   Eosinophils Relative 0 %   Eosinophils Absolute  0.1 0.0 - 0.7 K/uL   Basophils Relative 0 %   Basophils Absolute 0.0 0.0 - 0.1 K/uL  Blood Culture (routine x 2)     Status: None (Preliminary result)   Collection Time: 05/23/16  5:52 PM  Result Value Ref Range   Specimen Description BLOOD RIGHT HAND    Special Requests BOTTLES DRAWN AEROBIC AND ANAEROBIC 6CC EACH    Culture NO GROWTH < 24 HOURS    Report Status PENDING   Brain natriuretic peptide     Status: Abnormal   Collection Time: 05/23/16  5:52 PM  Result Value Ref Range   B Natriuretic Peptide 642.0 (H) 0.0 - 100.0 pg/mL  Lactic acid, plasma     Status: None   Collection Time: 05/23/16  5:52 PM  Result Value Ref Range   Lactic Acid, Venous 1.1 0.5 - 1.9 mmol/Crawford  Hepatic function panel     Status: Abnormal   Collection Time: 05/23/16  5:52 PM  Result Value Ref Range   Total Protein 6.6 6.5 - 8.1 g/dL   Albumin 3.5 3.5 - 5.0 g/dL   AST 19 15 - 41 U/Crawford   ALT 14 14 - 54 U/Crawford   Alkaline Phosphatase 65 38 - 126 U/Crawford   Total Bilirubin 1.8 (H) 0.3 - 1.2 mg/dL   Bilirubin, Direct 0.8 (H) 0.1 - 0.5 mg/dL   Indirect Bilirubin 1.0  (H) 0.3 - 0.9 mg/dL  Lipase, blood     Status: None   Collection Time: 05/23/16  5:52 PM  Result Value Ref Range   Lipase 13 11 - 51 U/Crawford  I-stat troponin, ED     Status: None   Collection Time: 05/23/16  6:04 PM  Result Value Ref Range   Troponin i, poc 0.04 0.00 - 0.08 ng/mL   Comment 3            Comment: Due to the release kinetics of cTnI, a negative result within the first hours of the onset of symptoms does not rule out myocardial infarction with certainty. If myocardial infarction is still suspected, repeat the test at appropriate intervals.   Type and screen  [facility]     Status: None   Collection Time: 05/23/16  7:27 PM  Result Value Ref Range   ABO/RH(D) A POS    Antibody Screen NEG    Sample Expiration 05/26/2016   Blood Culture (routine x 2)     Status: None (Preliminary result)   Collection Time: 05/23/16  7:28 PM  Result Value Ref Range   Specimen Description BLOOD LEFT HAND    Special Requests BOTTLES DRAWN AEROBIC ONLY 6CC ONLY    Culture NO GROWTH < 24 HOURS    Report Status PENDING   Urinalysis, Routine w reflex microscopic (not at [facility])     Status: Abnormal   Collection Time: 05/23/16  8:27 PM  Result Value Ref Range   Color, Urine YELLOW YELLOW   APPearance CLEAR CLEAR   Specific Gravity, Urine <1.005 (Crawford) 1.005 - 1.030   pH 6.5 5.0 - 8.0   Glucose, UA NEGATIVE NEGATIVE mg/dL   Hgb urine dipstick NEGATIVE NEGATIVE   Bilirubin Urine NEGATIVE NEGATIVE   Ketones, ur NEGATIVE NEGATIVE mg/dL   Protein, ur NEGATIVE NEGATIVE mg/dL   Nitrite NEGATIVE NEGATIVE   Leukocytes, UA TRACE (A) NEGATIVE  Urine culture     Status: None (Preliminary result)   Collection Time: 05/23/16  8:27 PM  Result Value Ref Range   Specimen Description URINE,  CLEAN CATCH    Special Requests NONE    Culture PENDING    Report Status PENDING   Urine microscopic-add on     Status: Abnormal   Collection Time: 05/23/16  8:27 PM  Result Value Ref Range   Squamous  Epithelial / LPF TOO NUMEROUS TO COUNT (A) NONE SEEN   WBC, UA 0-5 0 - 5 WBC/hpf   RBC / HPF 0-5 0 - 5 RBC/hpf   Bacteria, UA FEW (A) NONE SEEN  Blood gas, arterial ([facility] & [facility] ONLY)     Status: Abnormal   Collection Time: 05/23/16  9:20 PM  Result Value Ref Range   FIO2 36.00    O2 Content 4.0 Crawford/min   Delivery systems NASAL CANNULA    pH, Arterial 7.447 7.350 - 7.450   pCO2 arterial 33.4 32.0 - 48.0 mmHg   pO2, Arterial 73.0 (Crawford) 83.0 - 108.0 mmHg   Bicarbonate 24.0 20.0 - 28.0 mmol/Crawford   Acid-base deficit 0.9 0.0 - 2.0 mmol/Crawford   O2 Saturation 95.0 %   Collection site BRACHIAL ARTERY    Drawn by [id number]    Sample type ARTERIAL    Allens test (pass/fail) NOT INDICATED (A) PASS  MRSA PCR Screening     Status: Abnormal   Collection Time: 05/23/16 11:45 PM  Result Value Ref Range   MRSA by PCR POSITIVE (A) NEGATIVE    Comment:        The GeneXpert MRSA Assay (FDA approved for NASAL specimens only), is one component of a comprehensive MRSA colonization surveillance program. It is not intended to diagnose MRSA infection nor to guide or monitor treatment for MRSA infections. RESULT CALLED TO, READ BACK BY AND VERIFIED WITH: STOPAGL,A @ 0211 ON 05/24/16 BY JUW   Lactic acid, plasma     Status: None   Collection Time: 05/24/16 12:59 AM  Result Value Ref Range   Lactic Acid, Venous 0.9 0.5 - 1.9 mmol/Crawford  Vitamin B12     Status: None   Collection Time: 05/24/16 12:59 AM  Result Value Ref Range   Vitamin B-12 204 180 - 914 pg/mL    Comment: (NOTE) This assay is not validated for testing neonatal or myeloproliferative syndrome specimens for Vitamin B12 levels. Performed at [facility]   Folate     Status: None   Collection Time: 05/24/16 12:59 AM  Result Value Ref Range   Folate 19.6 >5.9 ng/mL    Comment: Performed at [facility]  Iron and TIBC     Status: Abnormal   Collection Time: 05/24/16 12:59 AM  Result Value Ref Range   Iron 6 (Crawford) 28 - 170 ug/dL   TIBC 207  (Crawford) 250 - 450 ug/dL   Saturation Ratios 3 (Crawford) 10.4 - 31.8 %   UIBC 201 ug/dL    Comment: Performed at [facility]  Ferritin     Status: None   Collection Time: 05/24/16 12:59 AM  Result Value Ref Range   Ferritin 63 11 - 307 ng/mL    Comment: Performed at [facility]  Reticulocytes     Status: Abnormal   Collection Time: 05/24/16 12:59 AM  Result Value Ref Range   Retic Ct Pct 2.3 0.4 - 3.1 %   RBC. 3.52 (Crawford) 3.87 - 5.11 MIL/uL   Retic Count, Manual 81.0 19.0 - 186.0 K/uL  Procalcitonin - Baseline     Status: None   Collection Time: 05/24/16 12:59 AM  Result Value Ref Range  Procalcitonin 1.61 ng/mL    Comment:        Interpretation: PCT > 0.5 ng/mL and <= 2 ng/mL: Systemic infection (sepsis) is possible, but other conditions are known to elevate PCT as well. (NOTE)         ICU PCT Algorithm               Non ICU PCT Algorithm    ----------------------------     ------------------------------         PCT < 0.25 ng/mL                 PCT < 0.1 ng/mL     Stopping of antibiotics            Stopping of antibiotics       strongly encouraged.               strongly encouraged.    ----------------------------     ------------------------------       PCT level decrease by               PCT < 0.25 ng/mL       >= 80% from peak PCT       OR PCT 0.25 - 0.5 ng/mL          Stopping of antibiotics                                             encouraged.     Stopping of antibiotics           encouraged.    ----------------------------     ------------------------------       PCT level decrease by              PCT >= 0.25 ng/mL       < 80% from peak PCT        AND PCT >= 0.5 ng/mL             Continuing antibiotics                                              encouraged.       Continuing antibiotics            encouraged.    ----------------------------     ------------------------------     PCT level increase compared          PCT > 0.5 ng/mL         with peak PCT AND           PCT >= 0.5 ng/mL             Escalation of antibiotics                                          strongly encouraged.      Escalation of antibiotics        strongly encouraged.   Troponin I     Status: Abnormal   Collection Time: 05/24/16 12:59 AM  Result Value Ref Range   Troponin I 0.04 (HH) <0.03 ng/mL    Comment: CRITICAL RESULT CALLED TO, READ BACK BY  AND VERIFIED WITH:  STOPHEL,A @ 0225 ON 05/24/16 BY JUW   Basic metabolic panel     Status: Abnormal   Collection Time: 05/24/16  5:44 AM  Result Value Ref Range   Sodium 132 (Crawford) 135 - 145 mmol/Crawford   Potassium 3.8 3.5 - 5.1 mmol/Crawford   Chloride 106 101 - 111 mmol/Crawford   CO2 24 22 - 32 mmol/Crawford   Glucose, Bld 103 (H) 65 - 99 mg/dL   BUN 9 6 - 20 mg/dL   Creatinine, Ser 0.85 0.44 - 1.00 mg/dL   Calcium 7.7 (Crawford) 8.9 - 10.3 mg/dL   GFR calc non Af Amer >60 >60 mL/min   GFR calc Af Amer >60 >60 mL/min    Comment: (NOTE) The eGFR has been calculated using the CKD EPI equation. This calculation has not been validated in all clinical situations. eGFR's persistently <60 mL/min signify possible Chronic Kidney Disease.   Troponin I     Status: Abnormal   Collection Time: 05/24/16  5:44 AM  Result Value Ref Range   Troponin I 0.03 (HH) <0.03 ng/mL    Comment: CRITICAL VALUE NOTED.  VALUE IS CONSISTENT WITH PREVIOUSLY REPORTED AND CALLED VALUE.  Troponin I     Status: None   Collection Time: 05/24/16 11:20 AM  Result Value Ref Range   Troponin I <0.03 <0.03 ng/mL  Procalcitonin     Status: None   Collection Time: 05/25/16  3:45 AM  Result Value Ref Range   Procalcitonin 1.61 ng/mL    Comment:        Interpretation: PCT > 0.5 ng/mL and <= 2 ng/mL: Systemic infection (sepsis) is possible, but other conditions are known to elevate PCT as well. (NOTE)         ICU PCT Algorithm               Non ICU PCT Algorithm    ----------------------------     ------------------------------         PCT < 0.25 ng/mL                 PCT < 0.1  ng/mL     Stopping of antibiotics            Stopping of antibiotics       strongly encouraged.               strongly encouraged.    ----------------------------     ------------------------------       PCT level decrease by               PCT < 0.25 ng/mL       >= 80% from peak PCT       OR PCT 0.25 - 0.5 ng/mL          Stopping of antibiotics                                             encouraged.     Stopping of antibiotics           encouraged.    ----------------------------     ------------------------------       PCT level decrease by              PCT >= 0.25 ng/mL       < 80% from peak PCT  AND PCT >= 0.5 ng/mL             Continuing antibiotics                                              encouraged.       Continuing antibiotics            encouraged.    ----------------------------     ------------------------------     PCT level increase compared          PCT > 0.5 ng/mL         with peak PCT AND          PCT >= 0.5 ng/mL             Escalation of antibiotics                                          strongly encouraged.      Escalation of antibiotics        strongly encouraged.   Basic metabolic panel     Status: Abnormal   Collection Time: 05/25/16  3:45 AM  Result Value Ref Range   Sodium 134 (Crawford) 135 - 145 mmol/Crawford   Potassium 3.9 3.5 - 5.1 mmol/Crawford   Chloride 103 101 - 111 mmol/Crawford   CO2 25 22 - 32 mmol/Crawford   Glucose, Bld 114 (H) 65 - 99 mg/dL   BUN 17 6 - 20 mg/dL   Creatinine, Ser 0.87 0.44 - 1.00 mg/dL   Calcium 8.6 (Crawford) 8.9 - 10.3 mg/dL   GFR calc non Af Amer >60 >60 mL/min   GFR calc Af Amer >60 >60 mL/min    Comment: (NOTE) The eGFR has been calculated using the CKD EPI equation. This calculation has not been validated in all clinical situations. eGFR's persistently <60 mL/min signify possible Chronic Kidney Disease.    Anion gap 6 5 - 15  CBC     Status: Abnormal   Collection Time: 05/25/16  3:45 AM  Result Value Ref Range   WBC 12.3 (H) 4.0 - 10.5  K/uL   RBC 3.44 (Crawford) 3.87 - 5.11 MIL/uL   Hemoglobin 8.8 (Crawford) 12.0 - 15.0 g/dL   HCT 27.4 (Crawford) 36.0 - 46.0 %   MCV 79.7 78.0 - 100.0 fL   MCH 25.6 (Crawford) 26.0 - 34.0 pg   MCHC 32.1 30.0 - 36.0 g/dL   RDW 17.8 (H) 11.5 - 15.5 %   Platelets 326 150 - 400 K/uL    ABGS  Recent Labs  05/23/16 2120  PHART 7.447  PO2ART 73.0*  HCO3 24.0   CULTURES Recent Results (from the past 240 hour(s))  Blood Culture (routine x 2)     Status: None (Preliminary result)   Collection Time: 05/23/16  5:52 PM  Result Value Ref Range Status   Specimen Description BLOOD RIGHT HAND  Final   Special Requests BOTTLES DRAWN AEROBIC AND ANAEROBIC 6CC EACH  Final   Culture NO GROWTH < 24 HOURS  Final   Report Status PENDING  Incomplete  Blood Culture (routine x 2)     Status: None (Preliminary result)   Collection Time: 05/23/16  7:28 PM  Result Value Ref Range Status   Specimen Description BLOOD LEFT  HAND  Final   Special Requests BOTTLES DRAWN AEROBIC ONLY 6CC ONLY  Final   Culture NO GROWTH < 24 HOURS  Final   Report Status PENDING  Incomplete  Urine culture     Status: None (Preliminary result)   Collection Time: 05/23/16  8:27 PM  Result Value Ref Range Status   Specimen Description URINE, CLEAN CATCH  Final   Special Requests NONE  Final   Culture PENDING  Incomplete   Report Status PENDING  Incomplete  MRSA PCR Screening     Status: Abnormal   Collection Time: 05/23/16 11:45 PM  Result Value Ref Range Status   MRSA by PCR POSITIVE (A) NEGATIVE Final    Comment:        The GeneXpert MRSA Assay (FDA approved for NASAL specimens only), is one component of a comprehensive MRSA colonization surveillance program. It is not intended to diagnose MRSA infection nor to guide or monitor treatment for MRSA infections. RESULT CALLED TO, READ BACK BY AND VERIFIED WITH: STOPAGL,A @ 0211 ON 05/24/16 BY JUW    Studies/Results: Ct Chest Wo Contrast  Result Date: 05/23/2016 CLINICAL DATA:  Chest pain  EXAM: CT CHEST WITHOUT CONTRAST TECHNIQUE: Multidetector CT imaging of the chest was performed following the standard protocol without IV contrast. COMPARISON:  06/06/2014 FINDINGS: Cardiovascular: Moderate cardiac enlargement. Previous median sternotomy and mitral valve replacement. No pericardial effusion. Aortic atherosclerosis noted. Mediastinum/Nodes: The trachea appears patent and is midline. Normal appearance of the esophagus. Multiple calcify scratch set numerous calcified and noncalcified lymph nodes are identified within the mediastinum. Noncalcified pre-vascular lymph node is enlarged measuring 11 mm, image number 42 of series 2. Noncalcified sub- carinal lymph node is also enlarged measuring 1.9 cm. Noncalcified right paratracheal lymph node measures 1.3 cm, image 28 of series 2. Small axillary lymph nodes are identified. Lungs/Pleura: Small to moderate bilateral pleural effusions are identified left greater than right. Subsegmental atelectasis is identified within the left lower lobe. Diffuse bilateral ground-glass attenuation in interlobular septal thickening is noted. Upper Abdomen: No acute abnormality. Musculoskeletal: No chest wall mass or suspicious bone lesions identified. IMPRESSION: 1. Bilateral pleural effusions, interlobular septal thickening and diffuse ground-glass attenuation is noted. Findings are favored to represent marked pulmonary edema perhaps secondary to congestive heart failure. Other considerations include diffuse alveolar hemorrhage and ARDS. 2. Cardiac enlargement. 3. Calcified and noncalcified lymph nodes are identified. A similar appearance was found on CT from 06/06/2014. Findings may reflect sequelae of granulomatous inflammation or infection. Electronically Signed   By: Kerby Moors M.D.   On: 05/23/2016 20:42   Dg Chest Port 1 View  Result Date: 05/23/2016 CLINICAL DATA:  Chest pain EXAM: PORTABLE CHEST 1 VIEW COMPARISON:  04/26/2016 FINDINGS: Previous median  sternotomy and CABG procedure. There is a moderate right pleural effusion. Diffuse hazy lung opacities are identified throughout both lungs, compatible with marked pulmonary edema. IMPRESSION: 1. Suspect marked pulmonary edema perhaps secondary to CHF. Electronically Signed   By: Kerby Moors M.D.   On: 05/23/2016 18:21    Medications:  Prior to Admission:  Prescriptions Prior to Admission  Medication Sig Dispense Refill Last Dose  . amLODipine (NORVASC) 5 MG tablet Take 5 mg by mouth daily.  3 05/23/2016 at Unknown time  . bumetanide (BUMEX) 1 MG tablet Take 1 mg by mouth 2 (two) times daily.  3 05/23/2016 at Unknown time  . citalopram (CELEXA) 40 MG tablet Take 40 mg by mouth 2 (two) times daily.    05/23/2016 at  Unknown time  . clopidogrel (PLAVIX) 75 MG tablet Take 75 mg by mouth daily.  3 05/23/2016 at Unknown time  . cyclobenzaprine (FLEXERIL) 10 MG tablet Take 1 tablet (10 mg total) by mouth 3 (three) times daily. 20 tablet 0 05/23/2016 at Unknown time  . ferrous sulfate 325 (65 FE) MG tablet Take 325 mg by mouth 2 (two) times daily with a meal.   05/23/2016 at Unknown time  . losartan (COZAAR) 50 MG tablet Take 50 mg by mouth 2 (two) times daily.  3 05/23/2016 at Unknown time  . metoprolol succinate (TOPROL-XL) 50 MG 24 hr tablet Take 50 mg by mouth daily.  3 05/23/2016 at 900a  . potassium chloride SA (K-DUR,KLOR-CON) 20 MEQ tablet Take 20 mEq by mouth 2 (two) times daily.  3 05/23/2016 at Unknown time  . QUEtiapine (SEROQUEL) 25 MG tablet Take 25 mg by mouth 2 (two) times daily.   05/23/2016 at Unknown time  . spironolactone (ALDACTONE) 25 MG tablet Take 25 mg by mouth daily.  3 05/23/2016 at Unknown time  . STOOL SOFTENER 100 MG capsule Take 100 mg by mouth daily.  3 05/23/2016 at Unknown time  . dexamethasone (DECADRON) 4 MG tablet Take 1 tablet (4 mg total) by mouth 2 (two) times daily with a meal. (Patient not taking: Reported on 05/23/2016) 12 tablet 0 Not Taking at Unknown time  . diclofenac  (VOLTAREN) 75 MG EC tablet Take 1 tablet (75 mg total) by mouth 2 (two) times daily. (Patient not taking: Reported on 05/23/2016) 14 tablet 0 Not Taking at Unknown time  . meclizine (ANTIVERT) 25 MG tablet Take 1 tablet (25 mg total) by mouth 3 (three) times daily as needed for dizziness. (Patient not taking: Reported on 05/23/2016) 10 tablet 0 Not Taking at Unknown time  . predniSONE (DELTASONE) 20 MG tablet Take 3 po QD x 3d , then 2 po QD x 3d then 1 po QD x 3d (Patient not taking: Reported on 05/23/2016) 18 tablet 0 Not Taking at Unknown time  . traMADol (ULTRAM) 50 MG tablet 1 or 2 po q6h prn pain (Patient not taking: Reported on 05/23/2016) 20 tablet 0 Not Taking at Unknown time   Scheduled: . Chlorhexidine Gluconate Cloth  6 each Topical Q0600  . citalopram  40 mg Oral BID  . cyclobenzaprine  10 mg Oral TID  . docusate sodium  100 mg Oral Daily  . ferrous sulfate  325 mg Oral BID WC  . furosemide  40 mg Intravenous Q12H  . levofloxacin (LEVAQUIN) IV  750 mg Intravenous Q24H  . metoprolol succinate  50 mg Oral Daily  . mupirocin ointment  1 application Nasal BID  . QUEtiapine  25 mg Oral BID  . sodium chloride flush  3 mL Intravenous Q12H  . spironolactone  25 mg Oral Daily   Continuous:  HWE:[id number] chloride, acetaminophen, ondansetron (ZOFRAN) IV, sodium chloride flush  Assesment: She was admitted with acute hypoxic respiratory failure. She has improved markedly. She did cough up blood. She has acute on chronic diastolic heart failure which is improving with diuresis. She is being treated for pneumonia and I think that's appropriate. I think probably 7 days of antibiotic including outpatient oral antibiotic is appropriate. Principal Problem:   Acute on chronic diastolic CHF (congestive heart failure) (HCC) Active Problems:   Congestive heart failure (HCC)   Essential hypertension   Acute respiratory failure with hypoxia (HCC)   Cough with hemoptysis   Postpartum complication  pericarditis in  2009 with eventual needing MVR in 2015   Normocytic anemia   Leukocytosis    Plan: Continue current treatments. I think her situation is mostly cardiac. She probably should have pulmonary function testing done as an outpatient to be sure she does have any baseline lung disease. I think she is already set up for sleep study which is also appropriate.    LOS: 2 days   Theresa Crawford 05/25/2016, 8:02 AM

## 2016-05-26 ENCOUNTER — Encounter (HOSPITAL_COMMUNITY): Payer: Self-pay

## 2016-05-26 ENCOUNTER — Encounter (HOSPITAL_COMMUNITY)
Admission: EM | Disposition: A | Discharge status: Home / Self Care | Payer: Self-pay | Source: Home / Self Care | Attending: Internal Medicine

## 2016-05-26 ENCOUNTER — Inpatient Hospital Stay (HOSPITAL_COMMUNITY): Payer: Medicaid Other

## 2016-05-26 DIAGNOSIS — D72829 Elevated white blood cell count, unspecified: Secondary | ICD-10-CM

## 2016-05-26 DIAGNOSIS — J189 Pneumonia, unspecified organism: Secondary | ICD-10-CM

## 2016-05-26 DIAGNOSIS — I10 Essential (primary) hypertension: Secondary | ICD-10-CM

## 2016-05-26 DIAGNOSIS — I342 Nonrheumatic mitral (valve) stenosis: Secondary | ICD-10-CM

## 2016-05-26 DIAGNOSIS — J9601 Acute respiratory failure with hypoxia: Secondary | ICD-10-CM

## 2016-05-26 DIAGNOSIS — Z9889 Other specified postprocedural states: Secondary | ICD-10-CM

## 2016-05-26 HISTORY — PX: TEE WITHOUT CARDIOVERSION: SHX5443

## 2016-05-26 LAB — BASIC METABOLIC PANEL
Anion gap: 9 (ref 5–15)
BUN: 18 mg/dL (ref 6–20)
CO2: 23 mmol/L (ref 22–32)
CREATININE: 0.89 mg/dL (ref 0.44–1.00)
Calcium: 8.8 mg/dL — ABNORMAL LOW (ref 8.9–10.3)
Chloride: 103 mmol/L (ref 101–111)
GFR calc Af Amer: >60 mL/min (ref >60)
GLUCOSE: 118 mg/dL — AB (ref 65–99)
POTASSIUM: 3.6 mmol/L (ref 3.5–5.1)
Sodium: 135 mmol/L (ref 135–145)

## 2016-05-26 LAB — CBC
HEMATOCRIT: 30.6 % — AB (ref 36.0–46.0)
Hemoglobin: 9.7 g/dL — ABNORMAL LOW (ref 12.0–15.0)
MCH: 25.3 pg — ABNORMAL LOW (ref 26.0–34.0)
MCHC: 31.7 g/dL (ref 30.0–36.0)
MCV: 79.9 fL (ref 78.0–100.0)
Platelets: 370 10*3/uL (ref 150–400)
RBC: 3.83 MIL/uL — ABNORMAL LOW (ref 3.87–5.11)
RDW: 17.7 % — AB (ref 11.5–15.5)
WBC: 6.9 10*3/uL (ref 4.0–10.5)

## 2016-05-26 LAB — ECHO TEE
Area-P 1/2: 2.75 cm2
CHL CUP MV M VEL: 106
MV Annulus VTI: 33.4 cm
MVSPHT: 80 ms
Mean grad: 5 mmHg
Reg peak vel: 271 cm/s
TRMAXVEL: 271 cm/s

## 2016-05-26 SURGERY — ECHOCARDIOGRAM, TRANSESOPHAGEAL
Anesthesia: Moderate Sedation

## 2016-05-26 MED ORDER — LIDOCAINE VISCOUS 2 % MT SOLN
OROMUCOSAL | Status: DC | PRN
  Administered 2016-05-26: 1 via OROMUCOSAL

## 2016-05-26 MED ORDER — MIDAZOLAM HCL 5 MG/5ML IJ SOLN
INTRAMUSCULAR | Status: AC
  Filled 2016-05-26: qty 10

## 2016-05-26 MED ORDER — SODIUM CHLORIDE 0.9 % IV SOLN: INTRAVENOUS | Status: DC

## 2016-05-26 MED ORDER — FENTANYL CITRATE (PF) 100 MCG/2ML IJ SOLN
INTRAMUSCULAR | Status: AC
  Filled 2016-05-26: qty 2

## 2016-05-26 MED ORDER — SODIUM CHLORIDE 0.9 % IV SOLN
INTRAVENOUS | Status: DC | PRN
  Administered 2016-05-26: 300 mL via INTRAMUSCULAR

## 2016-05-26 MED ORDER — BUTAMBEN-TETRACAINE-BENZOCAINE 2-2-14 % EX AERO
INHALATION_SPRAY | CUTANEOUS | Status: DC | PRN
  Administered 2016-05-26: 2 via TOPICAL

## 2016-05-26 MED ORDER — LIDOCAINE VISCOUS 2 % MT SOLN
OROMUCOSAL | Status: AC
  Filled 2016-05-26: qty 15

## 2016-05-26 MED ORDER — FENTANYL CITRATE (PF) 100 MCG/2ML IJ SOLN
INTRAMUSCULAR | Status: DC | PRN
  Administered 2016-05-26 (×2): 25 ug via INTRAVENOUS

## 2016-05-26 MED ORDER — MIDAZOLAM HCL 5 MG/5ML IJ SOLN
INTRAMUSCULAR | Status: DC | PRN
  Administered 2016-05-26 (×3): 2 mg via INTRAVENOUS

## 2016-05-26 NOTE — Progress Notes (Signed)
*  PRELIMINARY RESULTS* Echocardiogram Echocardiogram Transesophageal has been performed.  Jeryl Columbialliott, Wilena Tyndall 05/26/2016, 4:01 PM

## 2016-05-26 NOTE — Progress Notes (Signed)
PROGRESS NOTE    Theresa Crawford  ZOX:[id number] DOB: 1981-02-11 DOA: 05/23/2016 PCP: Jerrell BelfastHouse, [name], [name]    Brief Narrative:  35 year-old female with a hx of diastolic CHF with a mitral valve repair 2015, HTN, and obesity presented with complaints of SOB with hemoptysis. While in the ED, she was noted to be hypokalemic, leukopenic, anemic, and, tachycardic. She was noted to have an elevated BNP of 642. CXR showed marked pulmonary edema perhaps secondary to CHF. Chest CT revealed bilateral pleural effusions, interlobular septal thickening and diffuse ground-glass attenuation is noted. She was admitted for further evaluation of CHF.   Assessment & Plan:   Principal Problem:   Acute on chronic diastolic CHF (congestive heart failure) (HCC) Active Problems:   Congestive heart failure (HCC)   Essential hypertension   Acute respiratory failure with hypoxia (HCC)   Cough with hemoptysis   Postpartum complication pericarditis in 2009 with eventual needing MVR in 2015   Anemia, iron deficiency   Leukocytosis   CAP (community acquired pneumonia)  1. Acute on chronic diastolic CHF. She has an elevated BNP of 642 on admission. She reports SOB and appeared to be in volume overload. CXR showed suspect marked pulmonary edema perhaps secondary to CHF. Echo revealed an EF of 55-60%. She has been diuresing well and appears to be approaching euvolemia. Cardiology was consulted and recommended TEE since valves were not well visualized on TTE and she has had multiple hospitalizations for acute decompensated congestive heart failure.        05/26/2016: TEE is planned for today. From a clinical standpoint she seems to be doing much better, her weight coming down to 101 kg from 106.9 kg (05/23/2016).  2. Acute respiratory failure with hypoxia. Secondary to CHF. She is requiring 2L of oxygen. Wean oxygen off as tolerated.  3. Possible pneumonia. Patient reports fever at home, cough and has a leukocytosis. She has  been started on antibiotics. She has been afebrile since coming to the hospital. Patient remained stable overnight and continues to improve. Plan is to complete 7 days of Levaquin.  4. Leukocytosis. Patient reports being on decadron last week for back pain. She completed this steroids course approximately 5 days ago. It is possible this may be contributing to leukocytosis. Steroids may have also contributed to increase in edema and precipitated chf exacerbation. Currently improving. Continue to monitor. 5. Cough with hemoptysis. Possibly related to pneumonia/CHF. Pulmonology has been consulted and agrees with current treatments. Plans are to treat with one week of abx. 6. Essential HTN. Blood pressure is running low. Will discontinue amlodipine and losartan. Place holding parameters on BB. Remains on metoprolol 25 mg daily 7. Iron deficiency anemia.  Hgb 9.0 on admission. Continue to monitor. Will give one dose of IV iron. Continue oral supplementation. She also has borderline low B12 level. Will give intramuscular injection and then start po supplements 8. Obesity. Noted.    DVT prophylaxis: SCDs  Code Status: Full  Family Communication: No family bedside Disposition Plan: Discharge home once improved.    Consultants:   Cardiology   Pulmonology   Procedures:   Echo   - Left ventricle: The cavity size was normal. Wall thickness was   increased in a pattern of mild LVH. Systolic function was normal.   The estimated ejection fraction was in the range of 55% to 60%.   Diastolic evaluation is limited in setting of mitral anular ring.   There is evidence suggesting elevated LA pressures. - Aortic  valve: Valve area (VTI): 2.78 cm^2. Valve area (Vmax):   2.82 cm^2. - Mitral valve: The morphology of the mitral valve is poorly   visualized. There appears to be an anular ring present. There is   turbulent blood flow across the valve. There is a severe gradient   across the mitral valve. Mean  gradient 14 mmHg. - Left atrium: The atrium was moderately dilated. - Right ventricle: Systolic function was mildly reduced. - Right atrium: The atrium was mildly dilated. - Tricuspid valve: Difficult to grade severity of TR, very   eccentric posterior jet. There was mild-moderate regurgitation. - Pulmonary arteries: Systolic pressure was moderately increased.   PA peak pressure: 40 mm Hg (S). PASP may be underestimated based   on available TR spectral Doppler. - Technically adequate study. - There is a severe gradient across the mitral valve. The valve   morphology is poorly visualized due to prior repair. The degree   of tricuspid regurgitation may also be underestimated based on   images. Recommend TEE to further evaluate.  Antimicrobials:   Levaquin 9/11 >>   Subjective: She states feeling much better, reports breathing is much improved denies fevers, chills, nausea, vomiting  Objective: Vitals:   05/26/16 0700 05/26/16 0800 05/26/16 0900 05/26/16 1000  BP: 110/65 (!) 89/61 101/70 111/68  Pulse: 85 86 80 82  Resp: (!) 30 15 (!) 26 (!) 31  Temp:      TempSrc:      SpO2: 94% 98% 96% 96%  Weight:      Height:        Intake/Output Summary (Last 24 hours) at 05/26/16 1148 Last data filed at 05/25/16 1755  Gross per 24 hour  Intake              240 ml  Output                4 ml  Net              236 ml   Filed Weights   05/24/16 0400 05/25/16 0500 05/26/16 0500  Weight: 105.5 kg (232 lb 9.4 oz) 102.4 kg (225 lb 12 oz) 101 kg (222 lb 10.6 oz)    Examination:  General exam: Appears calm and comfortable  Respiratory system: Clear to auscultation. Respiratory effort normal. Cardiovascular system: S1 & S2 heard, RRR. No JVD, murmurs, rubs, gallops or clicks. No pedal edema. Gastrointestinal system: Abdomen is nondistended, soft and nontender. No organomegaly or masses felt. Normal bowel sounds heard. Central nervous system: Alert and oriented. No focal neurological  deficits. Extremities: Symmetric 5 x 5 power. Skin: No rashes, lesions or ulcers Psychiatry: Judgement and insight appear normal. Mood & affect appropriate.     Data Reviewed: I have personally reviewed following labs and imaging studies  CBC:  Recent Labs Lab 05/23/16 1752 05/25/16 0345 05/26/16 0414  WBC 21.9*  21.6* 12.3* 6.9  NEUTROABS 18.2*  --   --   HGB 9.0*  9.0* 8.8* 9.7*  HCT 27.9*  27.9* 27.4* 30.6*  MCV 79.7  79.9 79.7 79.9  PLT 318  282 326 370   Basic Metabolic Panel:  Recent Labs Lab 05/23/16 1752 05/24/16 0544 05/25/16 0345 05/26/16 0414  NA 130* 132* 134* 135  K 4.0 3.8 3.9 3.6  CL 100* 106 103 103  CO2 21* 24 25 23   GLUCOSE 113* 103* 114* 118*  BUN 9 9 17 18   CREATININE 0.73 0.85 0.87 0.89  CALCIUM 8.5* 7.7* 8.6* 8.8*  GFR: Estimated Creatinine Clearance: 101 mL/min (by C-G formula based on SCr of 0.89 mg/dL). Liver Function Tests:  Recent Labs Lab 05/23/16 1752  AST 19  ALT 14  ALKPHOS 65  BILITOT 1.8*  PROT 6.6  ALBUMIN 3.5    Recent Labs Lab 05/23/16 1752  LIPASE 13   No results for input(s): AMMONIA in the last 168 hours. Coagulation Profile: No results for input(s): INR, PROTIME in the last 168 hours. Cardiac Enzymes:  Recent Labs Lab 05/24/16 0059 05/24/16 0544 05/24/16 1120  TROPONINI 0.04* 0.03* <0.03   BNP (last 3 results) No results for input(s): PROBNP in the last 8760 hours. HbA1C: No results for input(s): HGBA1C in the last 72 hours. CBG: No results for input(s): GLUCAP in the last 168 hours. Lipid Profile: No results for input(s): CHOL, HDL, LDLCALC, TRIG, CHOLHDL, LDLDIRECT in the last 72 hours. Thyroid Function Tests: No results for input(s): TSH, T4TOTAL, FREET4, T3FREE, THYROIDAB in the last 72 hours. Anemia Panel:  Recent Labs  05/24/16 0059  VITAMINB12 204  FOLATE 19.6  FERRITIN 63  TIBC 207*  IRON 6*  RETICCTPCT 2.3   Sepsis Labs:  Recent Labs Lab 05/23/16 1752 05/24/16 0059  05/25/16 0345  PROCALCITON  --  1.61 1.61  LATICACIDVEN 1.1 0.9  --     Recent Results (from the past 240 hour(s))  Blood Culture (routine x 2)     Status: None (Preliminary result)   Collection Time: 05/23/16  5:52 PM  Result Value Ref Range Status   Specimen Description BLOOD RIGHT HAND  Final   Special Requests BOTTLES DRAWN AEROBIC AND ANAEROBIC 6CC EACH  Final   Culture NO GROWTH 3 DAYS  Final   Report Status PENDING  Incomplete  Blood Culture (routine x 2)     Status: None (Preliminary result)   Collection Time: 05/23/16  7:28 PM  Result Value Ref Range Status   Specimen Description BLOOD LEFT HAND  Final   Special Requests BOTTLES DRAWN AEROBIC ONLY 6CC ONLY  Final   Culture NO GROWTH 3 DAYS  Final   Report Status PENDING  Incomplete  Urine culture     Status: None   Collection Time: 05/23/16  8:27 PM  Result Value Ref Range Status   Specimen Description URINE, CLEAN CATCH  Final   Special Requests NONE  Final   Culture NO GROWTH Performed at [facility]   Final   Report Status 05/25/2016 FINAL  Final  MRSA PCR Screening     Status: Abnormal   Collection Time: 05/23/16 11:45 PM  Result Value Ref Range Status   MRSA by PCR POSITIVE (A) NEGATIVE Final    Comment:        The GeneXpert MRSA Assay (FDA approved for NASAL specimens only), is one component of a comprehensive MRSA colonization surveillance program. It is not intended to diagnose MRSA infection nor to guide or monitor treatment for MRSA infections. RESULT CALLED TO, READ BACK BY AND VERIFIED WITH: STOPAGL,A @ 0211 ON 05/24/16 BY JUW          Radiology Studies: No results found.      Scheduled Meds: . Chlorhexidine Gluconate Cloth  6 each Topical Q0600  . citalopram  40 mg Oral BID  . cyclobenzaprine  10 mg Oral TID  . docusate sodium  100 mg Oral Daily  . ferrous sulfate  325 mg Oral BID WC  . furosemide  40 mg Intravenous Q12H  . levofloxacin (LEVAQUIN) IV  750 mg  Intravenous  Q24H  . metoprolol succinate  25 mg Oral Daily  . mupirocin ointment  1 application Nasal BID  . QUEtiapine  25 mg Oral BID  . sodium chloride flush  3 mL Intravenous Q12H  . spironolactone  12.5 mg Oral Daily  . vitamin B-12  1,000 mcg Oral Daily   Continuous Infusions: . sodium chloride       LOS: 3 days    Time spent: 25 minutes    Erick Blinks, MD [facility] If 7PM-7AM, please contact night-coverage [email or web address] Password [facility] 05/26/2016, 11:48 AM

## 2016-05-26 NOTE — Progress Notes (Signed)
Patient transported off unit to endo for TEE.

## 2016-05-26 NOTE — Progress Notes (Signed)
Subjective: She was admitted with respiratory failure. This appears to be mostly from acute heart failure. She had some purulent sputum with hemoptysis and is being treated for pneumonia as well. She is clearly improving.  Objective: Vital signs in last 24 hours: Temp:  [97.3 F (36.3 C)-98.3 F (36.8 C)] 97.6 F (36.4 C) (09/13 0400) Pulse Rate:  [81-102] 85 (09/13 0500) Resp:  [15-37] 32 (09/13 0500) BP: (82-114)/(42-83) 114/65 (09/13 0500) SpO2:  [93 %-100 %] 93 % (09/13 0500) Weight:  [101 kg (222 lb 10.6 oz)] 101 kg (222 lb 10.6 oz) (09/13 0500) Weight change: -1.4 kg (-3 lb 1.4 oz) Last BM Date: 05/23/16  Intake/Output from previous day: 09/12 0701 - 09/13 0700 In: 240 [P.O.:240] Out: 4 [Urine:3; Stool:1]  PHYSICAL EXAM General appearance: alert, cooperative and no distress Resp: rhonchi bilaterally Cardio: regular rate and rhythm, S1, S2 normal, no murmur, click, rub or gallop GI: soft, non-tender; bowel sounds normal; no masses,  no organomegaly Extremities: extremities normal, atraumatic, no cyanosis or edema  Lab Results:  Results for orders placed or performed during the hospital encounter of 05/23/16 (from the past 48 hour(s))  Troponin I     Status: None   Collection Time: 05/24/16 11:20 AM  Result Value Ref Range   Troponin I <0.03 <0.03 ng/mL  Procalcitonin     Status: None   Collection Time: 05/25/16  3:45 AM  Result Value Ref Range   Procalcitonin 1.61 ng/mL    Comment:        Interpretation: PCT > 0.5 ng/mL and <= 2 ng/mL: Systemic infection (sepsis) is possible, but other conditions are known to elevate PCT as well. (NOTE)         ICU PCT Algorithm               Non ICU PCT Algorithm    ----------------------------     ------------------------------         PCT < 0.25 ng/mL                 PCT < 0.1 ng/mL     Stopping of antibiotics            Stopping of antibiotics       strongly encouraged.               strongly encouraged.     ----------------------------     ------------------------------       PCT level decrease by               PCT < 0.25 ng/mL       >= 80% from peak PCT       OR PCT 0.25 - 0.5 ng/mL          Stopping of antibiotics                                             encouraged.     Stopping of antibiotics           encouraged.    ----------------------------     ------------------------------       PCT level decrease by              PCT >= 0.25 ng/mL       < 80% from peak PCT        AND PCT >= 0.5 ng/mL  Continuing antibiotics                                              encouraged.       Continuing antibiotics            encouraged.    ----------------------------     ------------------------------     PCT level increase compared          PCT > 0.5 ng/mL         with peak PCT AND          PCT >= 0.5 ng/mL             Escalation of antibiotics                                          strongly encouraged.      Escalation of antibiotics        strongly encouraged.   Basic metabolic panel     Status: Abnormal   Collection Time: 05/25/16  3:45 AM  Result Value Ref Range   Sodium 134 (L) 135 - 145 mmol/L   Potassium 3.9 3.5 - 5.1 mmol/L   Chloride 103 101 - 111 mmol/L   CO2 25 22 - 32 mmol/L   Glucose, Bld 114 (H) 65 - 99 mg/dL   BUN 17 6 - 20 mg/dL   Creatinine, Ser 0.87 0.44 - 1.00 mg/dL   Calcium 8.6 (L) 8.9 - 10.3 mg/dL   GFR calc non Af Amer >60 >60 mL/min   GFR calc Af Amer >60 >60 mL/min    Comment: (NOTE) The eGFR has been calculated using the CKD EPI equation. This calculation has not been validated in all clinical situations. eGFR's persistently <60 mL/min signify possible Chronic Kidney Disease.    Anion gap 6 5 - 15  CBC     Status: Abnormal   Collection Time: 05/25/16  3:45 AM  Result Value Ref Range   WBC 12.3 (H) 4.0 - 10.5 K/uL   RBC 3.44 (L) 3.87 - 5.11 MIL/uL   Hemoglobin 8.8 (L) 12.0 - 15.0 g/dL   HCT 27.4 (L) 36.0 - 46.0 %   MCV 79.7 78.0 - 100.0 fL    MCH 25.6 (L) 26.0 - 34.0 pg   MCHC 32.1 30.0 - 36.0 g/dL   RDW 17.8 (H) 11.5 - 15.5 %   Platelets 326 150 - 400 K/uL  Basic metabolic panel     Status: Abnormal   Collection Time: 05/26/16  4:14 AM  Result Value Ref Range   Sodium 135 135 - 145 mmol/L   Potassium 3.6 3.5 - 5.1 mmol/L   Chloride 103 101 - 111 mmol/L   CO2 23 22 - 32 mmol/L   Glucose, Bld 118 (H) 65 - 99 mg/dL   BUN 18 6 - 20 mg/dL   Creatinine, Ser 0.89 0.44 - 1.00 mg/dL   Calcium 8.8 (L) 8.9 - 10.3 mg/dL   GFR calc non Af Amer >60 >60 mL/min   GFR calc Af Amer >60 >60 mL/min    Comment: (NOTE) The eGFR has been calculated using the CKD EPI equation. This calculation has not been validated in all clinical situations. eGFR's persistently <60 mL/min signify possible Chronic Kidney Disease.  Anion gap 9 5 - 15  CBC     Status: Abnormal   Collection Time: 05/26/16  4:14 AM  Result Value Ref Range   WBC 6.9 4.0 - 10.5 K/uL   RBC 3.83 (L) 3.87 - 5.11 MIL/uL   Hemoglobin 9.7 (L) 12.0 - 15.0 g/dL   HCT 30.6 (L) 36.0 - 46.0 %   MCV 79.9 78.0 - 100.0 fL   MCH 25.3 (L) 26.0 - 34.0 pg   MCHC 31.7 30.0 - 36.0 g/dL   RDW 17.7 (H) 11.5 - 15.5 %   Platelets 370 150 - 400 K/uL    ABGS  Recent Labs  05/23/16 2120  PHART 7.447  PO2ART 73.0*  HCO3 24.0   CULTURES Recent Results (from the past 240 hour(s))  Blood Culture (routine x 2)     Status: None (Preliminary result)   Collection Time: 05/23/16  5:52 PM  Result Value Ref Range Status   Specimen Description BLOOD RIGHT HAND  Final   Special Requests BOTTLES DRAWN AEROBIC AND ANAEROBIC 6CC EACH  Final   Culture NO GROWTH 2 DAYS  Final   Report Status PENDING  Incomplete  Blood Culture (routine x 2)     Status: None (Preliminary result)   Collection Time: 05/23/16  7:28 PM  Result Value Ref Range Status   Specimen Description BLOOD LEFT HAND  Final   Special Requests BOTTLES DRAWN AEROBIC ONLY 6CC ONLY  Final   Culture NO GROWTH 2 DAYS  Final   Report  Status PENDING  Incomplete  Urine culture     Status: None   Collection Time: 05/23/16  8:27 PM  Result Value Ref Range Status   Specimen Description URINE, CLEAN CATCH  Final   Special Requests NONE  Final   Culture NO GROWTH Performed at [facility]   Final   Report Status 05/25/2016 FINAL  Final  MRSA PCR Screening     Status: Abnormal   Collection Time: 05/23/16 11:45 PM  Result Value Ref Range Status   MRSA by PCR POSITIVE (A) NEGATIVE Final    Comment:        The GeneXpert MRSA Assay (FDA approved for NASAL specimens only), is one component of a comprehensive MRSA colonization surveillance program. It is not intended to diagnose MRSA infection nor to guide or monitor treatment for MRSA infections. RESULT CALLED TO, READ BACK BY AND VERIFIED WITH: STOPAGL,A @ 0211 ON 05/24/16 BY JUW    Studies/Results: No results found.  Medications:  Prior to Admission:  Prescriptions Prior to Admission  Medication Sig Dispense Refill Last Dose  . amLODipine (NORVASC) 5 MG tablet Take 5 mg by mouth daily.  3 05/23/2016 at Unknown time  . bumetanide (BUMEX) 1 MG tablet Take 1 mg by mouth 2 (two) times daily.  3 05/23/2016 at Unknown time  . citalopram (CELEXA) 40 MG tablet Take 40 mg by mouth 2 (two) times daily.    05/23/2016 at Unknown time  . clopidogrel (PLAVIX) 75 MG tablet Take 75 mg by mouth daily.  3 05/23/2016 at Unknown time  . cyclobenzaprine (FLEXERIL) 10 MG tablet Take 1 tablet (10 mg total) by mouth 3 (three) times daily. 20 tablet 0 05/23/2016 at Unknown time  . ferrous sulfate 325 (65 FE) MG tablet Take 325 mg by mouth 2 (two) times daily with a meal.   05/23/2016 at Unknown time  . losartan (COZAAR) 50 MG tablet Take 50 mg by mouth 2 (two) times daily.  3  05/23/2016 at Unknown time  . metoprolol succinate (TOPROL-XL) 50 MG 24 hr tablet Take 50 mg by mouth daily.  3 05/23/2016 at 900a  . potassium chloride SA (K-DUR,KLOR-CON) 20 MEQ tablet Take 20 mEq by mouth 2 (two)  times daily.  3 05/23/2016 at Unknown time  . QUEtiapine (SEROQUEL) 25 MG tablet Take 25 mg by mouth 2 (two) times daily.   05/23/2016 at Unknown time  . spironolactone (ALDACTONE) 25 MG tablet Take 25 mg by mouth daily.  3 05/23/2016 at Unknown time  . STOOL SOFTENER 100 MG capsule Take 100 mg by mouth daily.  3 05/23/2016 at Unknown time  . dexamethasone (DECADRON) 4 MG tablet Take 1 tablet (4 mg total) by mouth 2 (two) times daily with a meal. (Patient not taking: Reported on 05/23/2016) 12 tablet 0 Not Taking at Unknown time  . diclofenac (VOLTAREN) 75 MG EC tablet Take 1 tablet (75 mg total) by mouth 2 (two) times daily. (Patient not taking: Reported on 05/23/2016) 14 tablet 0 Not Taking at Unknown time  . meclizine (ANTIVERT) 25 MG tablet Take 1 tablet (25 mg total) by mouth 3 (three) times daily as needed for dizziness. (Patient not taking: Reported on 05/23/2016) 10 tablet 0 Not Taking at Unknown time  . predniSONE (DELTASONE) 20 MG tablet Take 3 po QD x 3d , then 2 po QD x 3d then 1 po QD x 3d (Patient not taking: Reported on 05/23/2016) 18 tablet 0 Not Taking at Unknown time  . traMADol (ULTRAM) 50 MG tablet 1 or 2 po q6h prn pain (Patient not taking: Reported on 05/23/2016) 20 tablet 0 Not Taking at Unknown time   Scheduled: . Chlorhexidine Gluconate Cloth  6 each Topical Q0600  . citalopram  40 mg Oral BID  . cyclobenzaprine  10 mg Oral TID  . docusate sodium  100 mg Oral Daily  . ferrous sulfate  325 mg Oral BID WC  . furosemide  40 mg Intravenous Q12H  . levofloxacin (LEVAQUIN) IV  750 mg Intravenous Q24H  . metoprolol succinate  25 mg Oral Daily  . mupirocin ointment  1 application Nasal BID  . QUEtiapine  25 mg Oral BID  . sodium chloride flush  3 mL Intravenous Q12H  . spironolactone  12.5 mg Oral Daily  . vitamin B-12  1,000 mcg Oral Daily   Continuous: . sodium chloride     WLS:LHTDSK chloride, acetaminophen, ondansetron (ZOFRAN) IV, sodium chloride flush  Assesment: She was  admitted with acute on chronic diastolic heart failure. She had acute hypoxic respiratory failure and is much better. She's been treated for community-acquired pneumonia in general he seems to be improving. Principal Problem:   Acute on chronic diastolic CHF (congestive heart failure) (HCC) Active Problems:   Congestive heart failure (HCC)   Essential hypertension   Acute respiratory failure with hypoxia (HCC)   Cough with hemoptysis   Postpartum complication pericarditis in 2009 with eventual needing MVR in 2015   Anemia, iron deficiency   Leukocytosis   CAP (community acquired pneumonia)    Plan: Continue treatments for the pneumonia I would probably give her 7 days of Levaquin including by mouth. I will plan to sign off. thanks for allowing me to see her with you.    LOS: 3 days   Sheretta Grumbine L 05/26/2016, 7:46 AM

## 2016-05-26 NOTE — Progress Notes (Signed)
Patient experienced mild epistaxis. Paged Dr. Vanessa BarbaraZamora and he responded almost immediatly. Stated that it was not anything to worry about and to just apply pressure. Patient is resting quietly, vitals are stable.

## 2016-05-26 NOTE — Progress Notes (Signed)
Patient doing well post procedure. Awake and alert, vital signs are stable, no complaint of pain. Good appetite, no nausea or vomiting. Family is at bedside. Call bell in reach, will continue to monitor.

## 2016-05-26 NOTE — Care Management Note (Signed)
Case Management Note  Patient Details  Name: Theresa Crawford MRN: [id number] Date of Birth: Jul 06, 1981  Subjective/Objective:                  Pt admitted with CHF. She is from home, lives with her mother and is ind with ADL's.  She has no HH services. She has a PCP, cardiologist, transportation and her medicaid covers her medications. She has applied for disability in the past for her CHF but was denied. She plans to return home with self care at DC.   Action/Plan: No CM needs anticipated.   Expected Discharge Date:    05/27/2013              Expected Discharge Plan:  Home/Self Care  In-House Referral:  NA  Discharge planning Services  CM Consult  Post Acute Care Choice:  NA Choice offered to:  NA  DME Arranged:    DME Agency:     HH Arranged:    HH Agency:     Status of Service:  Completed, signed off  If discussed at [employer] Length of Stay Meetings, dates discussed:    Additional Comments:  Theresa Crawford, Theresa Perea Demske, RN 05/26/2016, 12:44 PM

## 2016-05-26 NOTE — Progress Notes (Addendum)
Subjective:  Feeling much better  Objective:  Vital Signs in the last 24 hours: Temp:  [97.3 F (36.3 C)-98.3 F (36.8 C)] 97.6 F (36.4 C) (09/13 0400) Pulse Rate:  [80-97] 82 (09/13 1000) Resp:  [15-37] 31 (09/13 1000) BP: (82-114)/(42-72) 111/68 (09/13 1000) SpO2:  [93 %-100 %] 96 % (09/13 1000) Weight:  [222 lb 10.6 oz (101 kg)] 222 lb 10.6 oz (101 kg) (09/13 0500)  Intake/Output from previous day: 09/12 0701 - 09/13 0700 In: 240 [P.O.:240] Out: 4 [Urine:3; Stool:1] Intake/Output from this shift: No intake/output data recorded.  Physical Exam: NECK: Increased JVD, HJR, no bruit LUNGS: Clear anterior, posterior, lateral HEART: Regular rate and rhythm, 2/6 systolic murmur apex,no  gallop, rub, bruit, thrill, or heave EXTREMITIES: Without cyanosis, clubbing, or edema   Lab Results:  Recent Labs  05/25/16 0345 05/26/16 0414  WBC 12.3* 6.9  HGB 8.8* 9.7*  PLT 326 370    Recent Labs  05/25/16 0345 05/26/16 0414  NA 134* 135  K 3.9 3.6  CL 103 103  CO2 25 23  GLUCOSE 114* 118*  BUN 17 18  CREATININE 0.87 0.89    Recent Labs  05/24/16 0544 05/24/16 1120  TROPONINI 0.03* <0.03   Hepatic Function Panel  Recent Labs  05/23/16 1752  PROT 6.6  ALBUMIN 3.5  AST 19  ALT 14  ALKPHOS 65  BILITOT 1.8*  BILIDIR 0.8*  IBILI 1.0*   No results for input(s): CHOL in the last 72 hours. No results for input(s): PROTIME in the last 72 hours.    Cardiac Studies: 2Decho 05/24/16 Study Conclusions   - Left ventricle: The cavity size was normal. Wall thickness was   increased in a pattern of mild LVH. Systolic function was normal.   The estimated ejection fraction was in the range of 55% to 60%.   Diastolic evaluation is limited in setting of mitral anular ring.   There is evidence suggesting elevated LA pressures. - Aortic valve: Valve area (VTI): 2.78 cm^2. Valve area (Vmax):   2.82 cm^2. - Mitral valve: The morphology of the mitral valve is poorly  visualized. There appears to be an anular ring present. There is   turbulent blood flow across the valve. There is a severe gradient   across the mitral valve. Mean gradient 14 mmHg. - Left atrium: The atrium was moderately dilated. - Right ventricle: Systolic function was mildly reduced. - Right atrium: The atrium was mildly dilated. - Tricuspid valve: Difficult to grade severity of TR, very   eccentric posterior jet. There was mild-moderate regurgitation. - Pulmonary arteries: Systolic pressure was moderately increased.   PA peak pressure: 40 mm Hg (S). PASP may be underestimated based   on available TR spectral Doppler. - Technically adequate study. - There is a severe gradient across the mitral valve. The valve   morphology is poorly visualized due to prior repair. The degree   of tricuspid regurgitation may also be underestimated based on   images. Recommend TEE to further evaluate.    Assessment/Plan:   . Acute on chronic diastolic HF -  Echo from [facility] shows normal LVEF, grade II diastolic dysfunction.  - I/Os incomplete this admission. Weight down from 235  to 222  today. - she is on lasix 40mg  IV bid, renal function remains stable with diuresis       2. Mitral valve repair -she reports repair done at [facility] in [place], [place] around July 2014 - some records received  by remain incomplete. She had MV repair 03/27/13 with a 30 mm CE ring, along with excision of the posterior leaflet chordae and resuspension. Noted to have moderate mitral valve stenosis s/p repair, details remain unclear.  - echo this admit shows mean gradient across valve of 14 mmHg, morphology of valve poorly visualized. Eccentric TR may be underestimated.  - multiple admissions for CHF over the last few months (at [facility] and [facility]), this may be the etiology.    - she needs TEE to further evaluate mitral stenosis and tricuspid regurgitation.Scheduled for 2:45 today. BP more  stable today on reduced meds and lungs clear.    3. Pulmonary hypertension - PASP of 63 by echo in 2016. PASP this admit 40 by echo may be underestimated based on available TR waveform.  - VQ scan 07/2015 was normal. - f/u repeat echo. Potential etiologies include left sided heart disease, mitral stenosis, obesity hypovent, OSA. She has an outpatient study pending. She mentions possible history of sarcoid, but she is unsure. CT chest does have some mediastinal adenopathy. From brief available records records froum outside hospital she had hilar lypmhadenopathy that resolved, negative ACE levels.    4. Hypotension - holding some home bp meds, will decrease toprol to 25 mg and aldactone to 12.5mg  daily. BP better today.   5. Pneumonia - abx per primary team       It also remains unclear why she is on plavix at home. We will f/u further records  PA.          LOS: 3 days    Jacolyn Reedy 05/26/2016, 10:26 AM   The patient was seen and examined, and I agree with the physical exam, assessment and plan as documented above, with modifications as noted below. Pt appears more stable today based on note review and today's assessment. BP has improved with reduction of metoprolol and spironolactone doses. Will plan for TEE later today to assess both mitral and tricuspid valves to assess stenosis and regurgitation, respectively.   Prentice Docker, MD, [facility]  05/26/2016 10:54 AM

## 2016-05-27 DIAGNOSIS — I050 Rheumatic mitral stenosis: Secondary | ICD-10-CM

## 2016-05-27 LAB — BASIC METABOLIC PANEL
Anion gap: 8 (ref 5–15)
BUN: 18 mg/dL (ref 6–20)
CHLORIDE: 105 mmol/L (ref 101–111)
CO2: 24 mmol/L (ref 22–32)
CREATININE: 0.88 mg/dL (ref 0.44–1.00)
Calcium: 9 mg/dL (ref 8.9–10.3)
GFR calc Af Amer: >60 mL/min (ref >60)
GLUCOSE: 103 mg/dL — AB (ref 65–99)
Potassium: 3.7 mmol/L (ref 3.5–5.1)
SODIUM: 137 mmol/L (ref 135–145)

## 2016-05-27 LAB — PROCALCITONIN: PROCALCITONIN: 0.3 ng/mL

## 2016-05-27 MED ORDER — LOSARTAN POTASSIUM 25 MG PO TABS: 25.0000 mg | ORAL_TABLET | Freq: Every day | ORAL | 0 refills | Status: DC

## 2016-05-27 MED ORDER — LOSARTAN POTASSIUM 50 MG PO TABS
25.0000 mg | ORAL_TABLET | Freq: Every day | ORAL | Status: DC
  Administered 2016-05-27: 25 mg via ORAL
  Filled 2016-05-27: qty 1

## 2016-05-27 MED ORDER — SPIRONOLACTONE 25 MG PO TABS: 12.5000 mg | ORAL_TABLET | Freq: Every day | ORAL | 1 refills | Status: DC

## 2016-05-27 MED ORDER — METOPROLOL SUCCINATE ER 25 MG PO TB24: 25.0000 mg | ORAL_TABLET | Freq: Every day | ORAL | 1 refills | Status: DC

## 2016-05-27 MED ORDER — LEVOFLOXACIN 750 MG PO TABS: 750.0000 mg | ORAL_TABLET | Freq: Every day | ORAL | 0 refills | Status: DC

## 2016-05-27 MED ORDER — BUMETANIDE 1 MG PO TABS
1.0000 mg | ORAL_TABLET | Freq: Two times a day (BID) | ORAL | Status: DC
  Administered 2016-05-27: 1 mg via ORAL
  Filled 2016-05-27: qty 1

## 2016-05-27 NOTE — Discharge Summary (Signed)
Physician Discharge Summary  Theresa Crawford ZOX:[id number] DOB: [birth year] DOA: 05/23/2016  PCP: Jerrell Belfast, [name]  Admit date: 05/23/2016 Discharge date: 05/27/2016  Time spent: 35 minutes  Recommendations for Outpatient Follow-up:  1. Please follow up on volume status, she was admitted for acute diastolic CHF and treated with IV Lasix, discharged on her home Bumex 1mg  PO BID. Aldactone decreased to 12.5 mg PO q daily. She will follow up with Cardiology  2. Follow up on blood pressures, Cozaar, metoprolol, spironolactone decreased due to low blood pressures.    Discharge Diagnoses:  Principal Problem:   Acute on chronic diastolic CHF (congestive heart failure) (HCC) Active Problems:   Congestive heart failure (HCC)   Essential hypertension   Acute respiratory failure with hypoxia (HCC)   Cough with hemoptysis   Postpartum complication pericarditis in 2009 with eventual needing MVR in 2015   Anemia, iron deficiency   Leukocytosis   CAP (community acquired pneumonia)   S/P mitral valve repair   Discharge Condition: Stable/Improved.   Diet recommendation: Heart Healthy  Filed Weights   05/25/16 0500 05/26/16 0500 05/27/16 0500  Weight: 102.4 kg (225 lb 12 oz) 101 kg (222 lb 10.6 oz) 98.2 kg (216 lb 7.9 oz)    History of present illness:  Theresa Crawford is a 35 y.o. female with medical history significant of diastolic CHF and mitral valve repair in 2015 after having peripartum pericarditis, HTN, obesity comes in with over one day of progressive worsening sob and cough which has been with blood.  She has had swelling in her legs and abdomen.  She reports her dry weight is around 220lbs, she is 235lbs today.  The last time she weighed herself was 3 days ago and she was around 223lbs.  Today she reports she started coughing up a lot of blood about a cup full and was very short of breath.  She reported a fever of over 102.  She came to the Ed.  Pt reports this is what typically happens  with her heart failure problems.  Pt reports she moved here in Nov 2016 and has been hospitalized at [facility] twice since then, this is her first hospitalization here.  She has a h/o of only needing intubation once when she swallowed a bee and it stung her uvula and she went into anaphylactic shock.  She is not on supplemental oxygen at home.  Pt on arrival was very tachypneic, tachycardic, thought she was septic initially so was given 2 liters of ivf bolus, after that she was given lasix 40mg  iv as it became clearer this was CHF.  Pt has urinated at least 6 times since given the lasix.  Her respiratory status has markedly improved.   Hospital Course:  Theresa Crawford Is a 35 year old female with a past medical history of diastolic congestive heart failure, mitral valve repair in 2015, hypertension, admitted to the medicine service on 05/23/2016 when she presented with complaints of shortness of breath, weight gain, extremity swelling. Symptoms felt to be secondary to acute on chronic diastolic congestive heart failure and was started on Lasix IV. Cardiology was consulted. Chest x-ray had revealed marked pulmonary edema. BNP was elevated at 642. There was also concern for the possibility of pneumonia for which she was started on empiric IV antibiotic therapy with Levaquin. On 05/24/2016 she had a transthoracic echocardiogram that showed EF of 55-60%. Mitral valve was poorly visualized for which cardiology recommended further workup with a transesophageal echocardiogram. There was concerns about  the possibility of a severe gradient across the mitral valve. She had a transesophageal echocardiogram performed on 05/26/2016 that revealed mitral annular ring present with mild to moderate stenosis. There was mild regurgitation. She shows significant clinical improvement with her weight, down to 216 pounds from 235 pounds on admission. During this hospitalization blood pressures were on the low normal side for which Cozaar  metoprolol and Aldactone were decreased. Blood pressures will need to be followed up on the outpatient setting. She was placed back on her home regimen of Bumex at 1 mg by mouth twice a day. She will also need monitoring of moderate mitral stenosis. She was given Hospital follow-up appointment with cardiology.  Procedures: Transesophageal echocardiogram performed on 05/26/2016 impression: - Left ventricle: Systolic function was normal. The estimated   ejection fraction was in the range of 55% to 60%. Wall motion was   normal; there were no regional wall motion abnormalities. - Mitral valve: Mitral annular ring present. Mild to moderate   stenosis present. There was mild regurgitation. Mean velocity   (D): 106 cm/s. Mean gradient (D): 5 mm Hg. Valve area by pressure   half-time: 2.75 cm^2. Indexed valve area by pressure half-time:   1.26 cm^2/m^2. - Left atrium: The atrium was moderately dilated. - Right ventricle: Systolic function was mildly reduced. - Right atrium: The atrium was mildly dilated. - Atrial septum: No defect or patent foramen ovale was identified. - Tricuspid valve: There was mild regurgitation. - Pulmonary arteries: PA peak pressure: 32 mm Hg (S).  Consultations:  Cardiology   Discharge Exam: Vitals:   05/27/16 0400 05/27/16 0500  BP: 114/72 114/74  Pulse: 82 81  Resp: (!) 30 (!) 28  Temp: 97.9 F (36.6 C)     General exam: Appears calm and comfortable  Respiratory system: Clear to auscultation. Respiratory effort normal. Cardiovascular system: S1 & S2 heard, RRR. No JVD, murmurs, rubs, gallops or clicks. No pedal edema. Gastrointestinal system: Abdomen is nondistended, soft and nontender. No organomegaly or masses felt. Normal bowel sounds heard. Central nervous system: Alert and oriented. No focal neurological deficits. Extremities: Symmetric 5 x 5 power. Skin: No rashes, lesions or ulcers Psychiatry: Judgement and insight appear normal. Mood & affect  appropriate.   Discharge Instructions   Discharge Instructions    Call MD for:    Complete by:  As directed    Call MD for:  difficulty breathing, headache or visual disturbances    Complete by:  As directed    Call MD for:  extreme fatigue    Complete by:  As directed    Call MD for:  hives    Complete by:  As directed    Call MD for:  persistant dizziness or light-headedness    Complete by:  As directed    Call MD for:  persistant nausea and vomiting    Complete by:  As directed    Call MD for:  redness, tenderness, or signs of infection (pain, swelling, redness, odor or green/yellow discharge around incision site)    Complete by:  As directed    Call MD for:  severe uncontrolled pain    Complete by:  As directed    Call MD for:  temperature >100.4    Complete by:  As directed    Diet - low sodium heart healthy    Complete by:  As directed    Increase activity slowly    Complete by:  As directed      Current Discharge  Medication List    START taking these medications   Details  levofloxacin (LEVAQUIN) 750 MG tablet Take 1 tablet (750 mg total) by mouth daily. Qty: 3 tablet, Refills: 0      CONTINUE these medications which have CHANGED   Details  losartan (COZAAR) 25 MG tablet Take 1 tablet (25 mg total) by mouth daily. Qty: 30 tablet, Refills: 0    metoprolol succinate (TOPROL-XL) 25 MG 24 hr tablet Take 1 tablet (25 mg total) by mouth daily. Qty: 60 tablet, Refills: 1    spironolactone (ALDACTONE) 25 MG tablet Take 0.5 tablets (12.5 mg total) by mouth daily. Qty: 30 tablet, Refills: 1      CONTINUE these medications which have NOT CHANGED   Details  bumetanide (BUMEX) 1 MG tablet Take 1 mg by mouth 2 (two) times daily. Refills: 3    citalopram (CELEXA) 40 MG tablet Take 40 mg by mouth 2 (two) times daily.     cyclobenzaprine (FLEXERIL) 10 MG tablet Take 1 tablet (10 mg total) by mouth 3 (three) times daily. Qty: 20 tablet, Refills: 0    ferrous sulfate  325 (65 FE) MG tablet Take 325 mg by mouth 2 (two) times daily with a meal.    QUEtiapine (SEROQUEL) 25 MG tablet Take 25 mg by mouth 2 (two) times daily.    STOOL SOFTENER 100 MG capsule Take 100 mg by mouth daily. Refills: 3    traMADol (ULTRAM) 50 MG tablet 1 or 2 po q6h prn pain Qty: 20 tablet, Refills: 0      STOP taking these medications     amLODipine (NORVASC) 5 MG tablet      clopidogrel (PLAVIX) 75 MG tablet      potassium chloride SA (K-DUR,KLOR-CON) 20 MEQ tablet      dexamethasone (DECADRON) 4 MG tablet      diclofenac (VOLTAREN) 75 MG EC tablet      meclizine (ANTIVERT) 25 MG tablet      predniSONE (DELTASONE) 20 MG tablet        Allergies  Allergen Reactions  . Lisinopril Shortness Of Breath and Swelling    Throat swelling  . Penicillins Shortness Of Breath and Swelling    Has patient had a PCN reaction causing immediate rash, facial/tongue/throat swelling, SOB or lightheadedness with hypotension: Yes Has patient had a PCN reaction causing severe rash involving mucus membranes or skin necrosis: Yes Has patient had a PCN reaction that required hospitalization No Has patient had a PCN reaction occurring within the last 10 years: No If all of the above answers are "NO", then may proceed with Cephalosporin use.   . Bee Venom   . Contrast Media [Iodinated Diagnostic Agents]     difinity  . Shellfish Allergy    Follow-up Information    Prentice Docker, MD Follow up on 06/11/2016.   Specialty:  Cardiology Why:  11:20 am Contact information: [address] [place] [address] [phone]        Jerrell Belfast, [name] Follow up in 1 week(s).   Specialty:  Nurse Practitioner Contact information: [address] [place] [address] [phone]            The results of significant diagnostics from this hospitalization (including imaging, microbiology, ancillary and laboratory) are listed below for reference.    Significant Diagnostic  Studies: Ct Chest Wo Contrast  Result Date: 05/23/2016 CLINICAL DATA:  Chest pain EXAM: CT CHEST WITHOUT CONTRAST TECHNIQUE: Multidetector CT imaging of the  chest was performed following the standard protocol without IV contrast. COMPARISON:  06/06/2014 FINDINGS: Cardiovascular: Moderate cardiac enlargement. Previous median sternotomy and mitral valve replacement. No pericardial effusion. Aortic atherosclerosis noted. Mediastinum/Nodes: The trachea appears patent and is midline. Normal appearance of the esophagus. Multiple calcify scratch set numerous calcified and noncalcified lymph nodes are identified within the mediastinum. Noncalcified pre-vascular lymph node is enlarged measuring 11 mm, image number 42 of series 2. Noncalcified sub- carinal lymph node is also enlarged measuring 1.9 cm. Noncalcified right paratracheal lymph node measures 1.3 cm, image 28 of series 2. Small axillary lymph nodes are identified. Lungs/Pleura: Small to moderate bilateral pleural effusions are identified left greater than right. Subsegmental atelectasis is identified within the left lower lobe. Diffuse bilateral ground-glass attenuation in interlobular septal thickening is noted. Upper Abdomen: No acute abnormality. Musculoskeletal: No chest wall mass or suspicious bone lesions identified. IMPRESSION: 1. Bilateral pleural effusions, interlobular septal thickening and diffuse ground-glass attenuation is noted. Findings are favored to represent marked pulmonary edema perhaps secondary to congestive heart failure. Other considerations include diffuse alveolar hemorrhage and ARDS. 2. Cardiac enlargement. 3. Calcified and noncalcified lymph nodes are identified. A similar appearance was found on CT from 06/06/2014. Findings may reflect sequelae of granulomatous inflammation or infection. Electronically Signed   By: Signa Kellaylor  Stroud M.D.   On: 05/23/2016 20:42   Ct Lumbar Spine Wo Contrast  Result Date: 05/10/2016 CLINICAL DATA:   Severe low back pain after picking up object today, RIGHT leg difficulty standing and walking. EXAM: CT LUMBAR SPINE WITHOUT CONTRAST TECHNIQUE: Multidetector CT imaging of the lumbar spine was performed without intravenous contrast administration. Multiplanar CT image reconstructions were also generated. COMPARISON:  None. FINDINGS: Large body habitus results in overall noisy image quality. SEGMENTATION: For the purposes of this report the last well-formed intervertebral disc space will be described as L5-S1, transitional anatomy with lumbarized S1 vertebral body in small S1-2 disc. ALIGNMENT: Lumbar vertebral bodies in alignment, maintenance of the lumbar lordosis. OSSEOUS STRUCTURES: Lumbar vertebral bodies and posterior elements are intact. Chronically fragmented LEFT L3 inferior articular facet on degenerative basis. Moderate L5-S1 disc height loss, endplate spurring and vacuum discs compatible with degenerative disc. Moderate RIGHT central L5-S1 broad-based disc osteophyte complex could affect the traversing RIGHT S1 nerve. Moderate to severe RIGHT L5-S1 neural foraminal narrowing. Small RIGHT central L4-5 disc protrusion. No destructive bony lesions. SOFT TISSUES: Included prevertebral and paraspinal soft tissues are nonsuspicious. Partially imaged LEFT pleural effusion. Trace calcific atherosclerosis of the aortoiliac vessels. IMPRESSION: No acute fracture or malalignment. Small RIGHT central L4-5 disc protrusion. Moderate RIGHT central L5-S1 broad-based disc osteophyte complex could affect the traversing RIGHT S1 nerve. Moderate to severe RIGHT L5-S1 neural foraminal narrowing. Partially imaged LEFT pleural effusion.  Recommend chest radiograph. Mild atherosclerosis. Electronically Signed   By: Awilda Metroourtnay  Bloomer M.D.   On: 05/10/2016 00:42   Dg Chest Port 1 View  Result Date: 05/23/2016 CLINICAL DATA:  Chest pain EXAM: PORTABLE CHEST 1 VIEW COMPARISON:  04/26/2016 FINDINGS: Previous median sternotomy  and CABG procedure. There is a moderate right pleural effusion. Diffuse hazy lung opacities are identified throughout both lungs, compatible with marked pulmonary edema. IMPRESSION: 1. Suspect marked pulmonary edema perhaps secondary to CHF. Electronically Signed   By: Signa Kellaylor  Stroud M.D.   On: 05/23/2016 18:21    Microbiology: Recent Results (from the past 240 hour(s))  Blood Culture (routine x 2)     Status: None (Preliminary result)   Collection Time: 05/23/16  5:52 PM  Result Value Ref Range Status   Specimen Description BLOOD RIGHT HAND  Final   Special Requests BOTTLES DRAWN AEROBIC AND ANAEROBIC 6CC EACH  Final   Culture NO GROWTH 3 DAYS  Final   Report Status PENDING  Incomplete  Blood Culture (routine x 2)     Status: None (Preliminary result)   Collection Time: 05/23/16  7:28 PM  Result Value Ref Range Status   Specimen Description BLOOD LEFT HAND  Final   Special Requests BOTTLES DRAWN AEROBIC ONLY 6CC ONLY  Final   Culture NO GROWTH 3 DAYS  Final   Report Status PENDING  Incomplete  Urine culture     Status: None   Collection Time: 05/23/16  8:27 PM  Result Value Ref Range Status   Specimen Description URINE, CLEAN CATCH  Final   Special Requests NONE  Final   Culture NO GROWTH Performed at [facility]   Final   Report Status 05/25/2016 FINAL  Final  MRSA PCR Screening     Status: Abnormal   Collection Time: 05/23/16 11:45 PM  Result Value Ref Range Status   MRSA by PCR POSITIVE (A) NEGATIVE Final    Comment:        The GeneXpert MRSA Assay (FDA approved for NASAL specimens only), is one component of a comprehensive MRSA colonization surveillance program. It is not intended to diagnose MRSA infection nor to guide or monitor treatment for MRSA infections. RESULT CALLED TO, READ BACK BY AND VERIFIED WITH: STOPAGL,A @ 0211 ON 05/24/16 BY JUW      Labs: Basic Metabolic Panel:  Recent Labs Lab 05/23/16 1752 05/24/16 0544 05/25/16 0345 05/26/16 0414  05/27/16 0436  NA 130* 132* 134* 135 137  K 4.0 3.8 3.9 3.6 3.7  CL 100* 106 103 103 105  CO2 21* 24 25 23 24   GLUCOSE 113* 103* 114* 118* 103*  BUN 9 9 17 18 18   CREATININE 0.73 0.85 0.87 0.89 0.88  CALCIUM 8.5* 7.7* 8.6* 8.8* 9.0   Liver Function Tests:  Recent Labs Lab 05/23/16 1752  AST 19  ALT 14  ALKPHOS 65  BILITOT 1.8*  PROT 6.6  ALBUMIN 3.5    Recent Labs Lab 05/23/16 1752  LIPASE 13   No results for input(s): AMMONIA in the last 168 hours. CBC:  Recent Labs Lab 05/23/16 1752 05/25/16 0345 05/26/16 0414  WBC 21.9*  21.6* 12.3* 6.9  NEUTROABS 18.2*  --   --   HGB 9.0*  9.0* 8.8* 9.7*  HCT 27.9*  27.9* 27.4* 30.6*  MCV 79.7  79.9 79.7 79.9  PLT 318  282 326 370   Cardiac Enzymes:  Recent Labs Lab 05/24/16 0059 05/24/16 0544 05/24/16 1120  TROPONINI 0.04* 0.03* <0.03   BNP: BNP (last 3 results)  Recent Labs  04/26/16 1927 05/23/16 1752  BNP 99.0 642.0*    ProBNP (last 3 results) No results for input(s): PROBNP in the last 8760 hours.  CBG: No results for input(s): GLUCAP in the last 168 hours.     Signed:  Jeralyn Bennett MD.  [facility] 05/27/2016, 10:45 AM

## 2016-05-27 NOTE — Progress Notes (Addendum)
Primary Cardiologist: Prentice Docker Crawford  Cardiology Specific Problem List: 1. Chronic Diastolic CHF 2. Mitral Regurgitation s/p repair 3. Pulmonary Hypertension  Subjective:    Feels good and wants to go home.   Objective:   Temp:  [97.4 F (36.3 C)-98.3 F (36.8 C)] 97.9 F (36.6 C) (09/14 0400) Pulse Rate:  [30-115] 81 (09/14 0500) Resp:  [15-35] 28 (09/14 0500) BP: (65-123)/(35-82) 114/74 (09/14 0500) SpO2:  [95 %-100 %] 98 % (09/14 0500) Weight:  [216 lb 7.9 oz (98.2 kg)] 216 lb 7.9 oz (98.2 kg) (09/14 0500) Last BM Date: 05/23/16  Filed Weights   05/25/16 0500 05/26/16 0500 05/27/16 0500  Weight: 225 lb 12 oz (102.4 kg) 222 lb 10.6 oz (101 kg) 216 lb 7.9 oz (98.2 kg)   No intake or output data in the 24 hours ending 05/27/16 0805  Telemetry: SR with PAC's   Exam:  General: No acute distress.  HEENT: Conjunctiva and lids normal, oropharynx clear.  Lungs: Clear to auscultation, nonlabored.  Cardiac: No elevated JVP or bruits. RRR, occasional extra systole, no gallop or rub.   Abdomen: Normoactive bowel sounds, nontender, nondistended.  Extremities: No pitting edema, distal pulses full.  Neuropsychiatric: Alert and oriented x3, affect appropriate.   Lab Results:  Basic Metabolic Panel:  Recent Labs Lab 05/25/16 0345 05/26/16 0414 05/27/16 0436  NA 134* 135 137  K 3.9 3.6 3.7  CL 103 103 105  CO2 25 23 24   GLUCOSE 114* 118* 103*  BUN 17 18 18   CREATININE 0.87 0.89 0.88  CALCIUM 8.6* 8.8* 9.0    Liver Function Tests:  Recent Labs Lab 05/23/16 1752  AST 19  ALT 14  ALKPHOS 65  BILITOT 1.8*  PROT 6.6  ALBUMIN 3.5    CBC:  Recent Labs Lab 05/23/16 1752 05/25/16 0345 05/26/16 0414  WBC 21.9*  21.6* 12.3* 6.9  HGB 9.0*  9.0* 8.8* 9.7*  HCT 27.9*  27.9* 27.4* 30.6*  MCV 79.7  79.9 79.7 79.9  PLT 318  282 326 370    Cardiac Enzymes:  Recent Labs Lab 05/24/16 0059 05/24/16 0544 05/24/16 1120  TROPONINI 0.04*  0.03* <0.03   2Decho 05/24/16  Left ventricle: The cavity size was normal. Wall thickness was increased in a pattern of mild LVH. Systolic function was normal. The estimated ejection fraction was in the range of 55% to 60%. Diastolic evaluation is limited in setting of mitral anular ring. There is evidence suggesting elevated LA pressures. - Aortic valve: Valve area (VTI): 2.78 cm^2. Valve area (Vmax): 2.82 cm^2. - Mitral valve: The morphology of the mitral valve is poorly visualized. There appears to be an anular ring present. There is turbulent blood flow across the valve. There is a severe gradient across the mitral valve. Mean gradient 14 mmHg. - Left atrium: The atrium was moderately dilated. - Right ventricle: Systolic function was mildly reduced. - Right atrium: The atrium was mildly dilated. - Tricuspid valve: Difficult to grade severity of TR, very eccentric posterior jet. There was mild-moderate regurgitation. - Pulmonary arteries: Systolic pressure was moderately increased. PA peak pressure: 40 mm Hg (S). PASP may be underestimated based on available TR spectral Doppler. - Technically adequate study. - There is a severe gradient across the mitral valve. The valve morphology is poorly visualized due to prior repair. The degree of tricuspid regurgitation may also be underestimated based on images. Recommend TEE to further evaluate.  TEE 05/26/2016 Left ventricle: Systolic function was normal. The estimated  ejection fraction was in the range of 55% to 60%. Wall motion was   normal; there were no regional wall motion abnormalities. - Mitral valve: Mitral annular ring present. Mild to moderate   stenosis present. There was mild regurgitation. Mean velocity   (D): 106 cm/s. Mean gradient (D): 5 mm Hg. Valve area by pressure   half-time: 2.75 cm^2. Indexed valve area by pressure half-time:   1.26 cm^2/m^2. - Left atrium: The atrium was moderately  dilated. - Right ventricle: Systolic function was mildly reduced. - Right atrium: The atrium was mildly dilated. - Atrial septum: No defect or patent foramen ovale was identified. - Tricuspid valve: There was mild regurgitation. - Pulmonary arteries: PA peak pressure: 32 mm Hg (S).    Medications:   Scheduled Medications: . Chlorhexidine Gluconate Cloth  6 each Topical Q0600  . citalopram  40 mg Oral BID  . cyclobenzaprine  10 mg Oral TID  . docusate sodium  100 mg Oral Daily  . ferrous sulfate  325 mg Oral BID WC  . furosemide  40 mg Intravenous Q12H  . levofloxacin (LEVAQUIN) IV  750 mg Intravenous Q24H  . metoprolol succinate  25 mg Oral Daily  . mupirocin ointment  1 application Nasal BID  . QUEtiapine  25 mg Oral BID  . sodium chloride flush  3 mL Intravenous Q12H  . spironolactone  12.5 mg Oral Daily  . vitamin B-12  1,000 mcg Oral Daily        PRN Medications:  sodium chloride, acetaminophen, ondansetron (ZOFRAN) IV, sodium chloride flush   Assessment and Plan:   1. Acute on Chronic Diastolic CHF: Has diuresed, but no I/O has been recorded. Weight is down From admission weight of 235-216 pounds this a.m. She offers no complaints of pain, recurrent breathing issues or edema. She has been up walking around in the room. She would like to return home. I think this is reasonable and she appears euvolemic. Will stop IV Lasix and transition her back to home dose of Bumex 1 mg twice a day. She states she had noticed weight gain and fluid retention after starting steroids for chronic back pain. She has no complaints of back pain now. A follow-up appointment has been made in the [facility] office with Dr. Dr. Purvis Sheffield for 06/11/2016. This will be listed in her discharge follow-up appointments. Will need follow-up BMET.  2. Mitral regurgitation status post repair: Initial echocardiogram revealed severe gradient across the mitral valve and was poorly visualized on transthoracic echo. A  follow-up TEE was completed on 05/26/2016. This demonstrated mitral annular ring present with mild to moderate stenosis present. The tricuspid valve also revealed mild regurgitation. We'll continue medical therapy. Metoprolol she was taking at home but at reduced dose to 25 mg daily as she is receiving as inpatient. Titration as outpatient if necessary.  3. Pulmonary hypertension: PA pressure per TEE revealed 32 mmHg compared to PA pressure of 40 mmHg by transthoracic echo. Consider restarting amlodipine on follow-up appointment at lower dose of 2.5 mg from 5 mg listed on home medication list.  4. Hypertension: Continue on spironolactone 12.5 mg daily, home medications have her on losartan 50 mg twice a day. BP has normalized during hospitalization. Would restart ARB at lower dose, 25 mg daily for home dose with titration as necessary. Creatinine 0.88.  Theresa Crawford. Theresa Crawford  05/27/2016, 8:05 AM   Attending Note Patient seen and discussed with NP Theresa Crawford, I agree with documentation. Admitted with acute on chronic  diastolic HF, weight down 235 to 216 lbs during admission. Renal function remains stable, she appears euvolemic on exam, restarted on her home bumex. TEE yesterday with moderate gradient across repaired mitral valve, initial TTE had severe gradient, difference likely related to heart rate as she was quite tachycardic on admission. She looks to have moderate MS across her previously repaired valve that will need to be monitored over time. Soft bp's this admit, ARB and beta blocker and aldactone have been decreased this admit. Ok for discharge from cardiac standpoint, she will f/u with Dr Purvis SheffieldKoneswaran in a few weeks.   Theresa Crawford

## 2016-05-27 NOTE — Progress Notes (Signed)
Discharge instructions and prescriptions given, verbalized understanding, out in stable condition via w/c with staff. 

## 2016-05-28 LAB — CULTURE, BLOOD (ROUTINE X 2)
Culture: NO GROWTH
Culture: NO GROWTH

## 2016-06-01 ENCOUNTER — Encounter (HOSPITAL_COMMUNITY): Payer: Self-pay | Admitting: Cardiovascular Disease

## 2016-06-11 ENCOUNTER — Ambulatory Visit: Payer: Medicaid Other | Admitting: Cardiovascular Disease

## 2016-06-11 ENCOUNTER — Encounter: Payer: Self-pay | Admitting: Cardiovascular Disease

## 2016-06-28 ENCOUNTER — Emergency Department (HOSPITAL_COMMUNITY): Payer: Medicaid Other

## 2016-06-28 ENCOUNTER — Encounter (HOSPITAL_COMMUNITY): Payer: Self-pay | Admitting: Emergency Medicine

## 2016-06-28 ENCOUNTER — Emergency Department (HOSPITAL_COMMUNITY)
Admission: EM | Admit: 2016-06-28 | Discharge: 2016-06-29 | Disposition: A | Discharge status: Home / Self Care | Payer: Medicaid Other | Attending: Emergency Medicine | Admitting: Emergency Medicine

## 2016-06-28 DIAGNOSIS — I110 Hypertensive heart disease with heart failure: Secondary | ICD-10-CM | POA: Insufficient documentation

## 2016-06-28 DIAGNOSIS — I509 Heart failure, unspecified: Secondary | ICD-10-CM | POA: Diagnosis not present

## 2016-06-28 DIAGNOSIS — Z79899 Other long term (current) drug therapy: Secondary | ICD-10-CM | POA: Insufficient documentation

## 2016-06-28 DIAGNOSIS — F17210 Nicotine dependence, cigarettes, uncomplicated: Secondary | ICD-10-CM | POA: Insufficient documentation

## 2016-06-28 DIAGNOSIS — M549 Dorsalgia, unspecified: Secondary | ICD-10-CM | POA: Insufficient documentation

## 2016-06-28 DIAGNOSIS — R05 Cough: Secondary | ICD-10-CM | POA: Diagnosis present

## 2016-06-28 LAB — COMPREHENSIVE METABOLIC PANEL
ALBUMIN: 4 g/dL (ref 3.5–5.0)
ALK PHOS: 59 U/L (ref 38–126)
ALT: 9 U/L — ABNORMAL LOW (ref 14–54)
ANION GAP: 7 (ref 5–15)
AST: 13 U/L — ABNORMAL LOW (ref 15–41)
BILIRUBIN TOTAL: 0.5 mg/dL (ref 0.3–1.2)
BUN: 15 mg/dL (ref 6–20)
CALCIUM: 8.9 mg/dL (ref 8.9–10.3)
CO2: 28 mmol/L (ref 22–32)
Chloride: 101 mmol/L (ref 101–111)
Creatinine, Ser: 0.91 mg/dL (ref 0.44–1.00)
GLUCOSE: 90 mg/dL (ref 65–99)
POTASSIUM: 3.6 mmol/L (ref 3.5–5.1)
Sodium: 136 mmol/L (ref 135–145)
TOTAL PROTEIN: 7.7 g/dL (ref 6.5–8.1)

## 2016-06-28 LAB — CBC WITH DIFFERENTIAL/PLATELET
BASOS PCT: 0 %
Basophils Absolute: 0 10*3/uL (ref 0.0–0.1)
Eosinophils Absolute: 0.2 10*3/uL (ref 0.0–0.7)
Eosinophils Relative: 2 %
HEMATOCRIT: 34.8 % — AB (ref 36.0–46.0)
HEMOGLOBIN: 11.3 g/dL — AB (ref 12.0–15.0)
LYMPHS ABS: 2.3 10*3/uL (ref 0.7–4.0)
Lymphocytes Relative: 21 %
MCH: 26.7 pg (ref 26.0–34.0)
MCHC: 32.5 g/dL (ref 30.0–36.0)
MCV: 82.1 fL (ref 78.0–100.0)
MONO ABS: 0.8 10*3/uL (ref 0.1–1.0)
MONOS PCT: 7 %
NEUTROS ABS: 7.8 10*3/uL — AB (ref 1.7–7.7)
NEUTROS PCT: 70 %
Platelets: 327 10*3/uL (ref 150–400)
RBC: 4.24 MIL/uL (ref 3.87–5.11)
RDW: 18.6 % — AB (ref 11.5–15.5)
WBC: 11.2 10*3/uL — ABNORMAL HIGH (ref 4.0–10.5)

## 2016-06-28 MED ORDER — ALBUTEROL SULFATE (2.5 MG/3ML) 0.083% IN NEBU: 5.0000 mg | INHALATION_SOLUTION | Freq: Once | RESPIRATORY_TRACT | Status: DC

## 2016-06-28 NOTE — ED Triage Notes (Signed)
Pt reports intermittently productive cough with white sputum, some chest tightness that started with the cough and L sided back pain with deep inspiration. Onset of symptoms yesterday.

## 2016-06-29 MED ORDER — FUROSEMIDE 10 MG/ML IJ SOLN
40.0000 mg | Freq: Once | INTRAMUSCULAR | Status: AC
  Administered 2016-06-29: 40 mg via INTRAVENOUS
  Filled 2016-06-29: qty 4

## 2016-06-29 MED ORDER — TRAMADOL HCL 50 MG PO TABS
50.0000 mg | ORAL_TABLET | Freq: Once | ORAL | Status: AC
  Administered 2016-06-29: 50 mg via ORAL
  Filled 2016-06-29: qty 1

## 2016-06-29 MED ORDER — TRAMADOL HCL 50 MG PO TABS: 50.0000 mg | ORAL_TABLET | Freq: Two times a day (BID) | ORAL | 0 refills | Status: DC | PRN

## 2016-06-29 NOTE — ED Provider Notes (Signed)
[facility]-EMERGENCY DEPT Provider Note   CSN: [id number] Arrival date & time: 06/28/16  2122  By signing my name below, I, Vista Mink, attest that this documentation has been prepared under the direction and in the presence of Zadie Rhine, MD. Electronically signed, Vista Mink, ED Scribe. 06/29/16. 12:34 AM.  History   Chief Complaint Chief Complaint  Patient presents with  . Cough    HPI HOUSTON SURGES is a 35 y.o. female.   Cough  This is a new problem. The current episode started yesterday. The problem occurs every few minutes. The problem has not changed since onset.The cough is productive of sputum. There has been no fever. Associated symptoms include shortness of breath. She is a smoker.  Shortness of Breath  This is a new problem. The problem occurs intermittently (during ambulation).The current episode started yesterday. Associated symptoms include cough. Pertinent negatives include no fever. She has tried nothing for the symptoms.  Back Pain   This is a new problem. The current episode started yesterday. The problem has not changed since onset.The pain is associated with no known injury (during deep inspiration). The pain is present in the thoracic spine. The pain does not radiate. The symptoms are aggravated by twisting. Pertinent negatives include no fever. Risk factors include lack of exercise.    Past Medical History:  Diagnosis Date  . CHF (congestive heart failure) (HCC)   . Congestive heart failure (HCC)   . History of open heart surgery   . Hypertension   . Mitral valve disease    mitral regurgitation    Patient Active Problem List   Diagnosis Date Noted  . S/P mitral valve repair   . CAP (community acquired pneumonia) 05/25/2016  . Leukocytosis 05/24/2016  . Acute respiratory failure with hypoxia (HCC) 05/23/2016  . Acute on chronic diastolic CHF (congestive heart failure) (HCC) 05/23/2016  . Cough with hemoptysis 05/23/2016  . Postpartum complication  pericarditis in 2009 with eventual needing MVR in 2015 05/23/2016  . Anemia, iron deficiency 05/23/2016  . Hypertension   . SOB (shortness of breath)   . Respiratory distress 02/16/2016  . Essential hypertension 02/16/2016  . Congestive heart failure (HCC) 11/26/2015    Past Surgical History:  Procedure Laterality Date  . ABDOMINAL HYSTERECTOMY    . CESAREAN SECTION     X's 2  . MITRAL VALVE REPAIR    . open heart surgery    . PARTIAL HYSTERECTOMY  2011   PID w/problem with fallopian tubes  . TEE WITHOUT CARDIOVERSION N/A 05/26/2016   Procedure: TRANSESOPHAGEAL ECHOCARDIOGRAM (TEE);  Surgeon: [name], MD;  Location: [facility];  Service: Cardiovascular;  Laterality: N/A;    OB History    Gravida Para Term Preterm AB Living   2 2 2          SAB TAB Ectopic Multiple Live Births                   Home Medications    Prior to Admission medications   Medication Sig Start Date End Date Taking? Authorizing Provider  bumetanide (BUMEX) 1 MG tablet Take 1 mg by mouth 2 (two) times daily. 10/31/15   Historical Provider, MD  citalopram (CELEXA) 40 MG tablet Take 40 mg by mouth 2 (two) times daily.     Historical Provider, MD  cyclobenzaprine (FLEXERIL) 10 MG tablet Take 1 tablet (10 mg total) by mouth 3 (three) times daily. 05/10/16   Ivery Quale, PA-C  ferrous sulfate 325 (  65 FE) MG tablet Take 325 mg by mouth 2 (two) times daily with a meal.    Historical Provider, MD  levofloxacin (LEVAQUIN) 750 MG tablet Take 1 tablet (750 mg total) by mouth daily. 05/27/16   Jeralyn Bennett, MD  losartan (COZAAR) 25 MG tablet Take 1 tablet (25 mg total) by mouth daily. 05/28/16   Jeralyn Bennett, MD  metoprolol succinate (TOPROL-XL) 25 MG 24 hr tablet Take 1 tablet (25 mg total) by mouth daily. 05/28/16   Jeralyn Bennett, MD  QUEtiapine (SEROQUEL) 25 MG tablet Take 25 mg by mouth 2 (two) times daily.    Historical Provider, MD  spironolactone (ALDACTONE) 25 MG tablet Take 0.5 tablets  (12.5 mg total) by mouth daily. 05/28/16   Jeralyn Bennett, MD  STOOL SOFTENER 100 MG capsule Take 100 mg by mouth daily. 10/29/15   Historical Provider, MD  traMADol Janean Sark) 50 MG tablet 1 or 2 po q6h prn pain Patient not taking: Reported on 05/23/2016 05/10/16   Ivery Quale, PA-C    Family History Family History  Problem Relation Age of Onset  . Heart failure Father     just received LVAD  . Heart disease Paternal Grandfather     stent placement    Social History Social History  Substance Use Topics  . Smoking status: Current Every Day Smoker    Packs/day: 0.25    Types: Cigarettes    Start date: 11/03/1999  . Smokeless tobacco: Never Used  . Alcohol use No     Allergies   Lisinopril; Penicillins; Bee venom; Contrast media [iodinated diagnostic agents]; and Shellfish allergy   Review of Systems Review of Systems  Constitutional: Negative for fever.  Respiratory: Positive for cough, chest tightness and shortness of breath.        Denies hemoptysis   Musculoskeletal: Positive for back pain (left sided).  All other systems reviewed and are negative.    Physical Exam Updated Vital Signs BP 98/77 (BP Location: Left Arm)   Pulse 93   Temp 98.3 F (36.8 C) (Oral)   Resp 17   Ht 5\' 4"  (1.626 m)   Wt 216 lb (98 kg)   LMP 06/20/2016   SpO2 100%   BMI 37.08 kg/m   Physical Exam CONSTITUTIONAL: Well developed/well nourished HEAD: Normocephalic/atraumatic EYES: EOMI/PERRL ENMT: Mucous membranes moist NECK: supple no meningeal signs. Mild JVD. SPINE/BACK:entire spine nontender CV: S1/S2 noted, no gallops noted. LUNGS: Decreased breath sounds at the bases. Tenderness to left chest wall noted. No crepitus ABDOMEN: soft, nontender, no rebound or guarding, bowel sounds noted throughout abdomen GU:no cva tenderness NEURO: Pt is awake/alert/appropriate, moves all extremitiesx4.  No facial droop.   EXTREMITIES: pulses normal/equal, full ROM. No lower extremity  edema. SKIN: warm, color normal PSYCH: no abnormalities of mood noted, alert and oriented to situation   ED Treatments / Results  DIAGNOSTIC STUDIES: Oxygen Saturation is 100% on RA, normal by my interpretation.  COORDINATION OF CARE: 12:34 AM-Will order Lasix. Discussed treatment plan with pt at bedside and pt agreed to plan.   Labs (all labs ordered are listed, but only abnormal results are displayed) Labs Reviewed  CBC WITH DIFFERENTIAL/PLATELET - Abnormal; Notable for the following:       Result Value   WBC 11.2 (*)    Hemoglobin 11.3 (*)    HCT 34.8 (*)    RDW 18.6 (*)    Neutro Abs 7.8 (*)    All other components within normal limits  COMPREHENSIVE METABOLIC PANEL -  Abnormal; Notable for the following:    AST 13 (*)    ALT 9 (*)    All other components within normal limits    EKG  EKG Interpretation  Date/Time:  Monday June 28 2016 21:34:13 EDT Ventricular Rate:  95 PR Interval:  136 QRS Duration: 76 QT Interval:  358 QTC Calculation: 449 R Axis:   64 Text Interpretation:  Sinus rhythm with Premature atrial complexes Biatrial enlargement Abnormal ECG No significant change since last tracing Confirmed by Bebe ShaggyWICKLINE  MD, Kessa Fairbairn ([id number]) on 06/29/2016 12:24:10 AM       Radiology Dg Chest 2 View  Result Date: 06/28/2016 CLINICAL DATA:  Dyspnea and cough, left lower rib pain. EXAM: CHEST  2 VIEW COMPARISON:  CT and chest radiograph from 05/23/2016. FINDINGS: Stable cardiomegaly with median sternotomy sutures and mitral valvular replacement as before. Diffuse hazy appearance of the lungs suggestive of CHF and likely layering effusions. No acute osseous abnormality. IMPRESSION: Cardiomegaly with changes secondary to mild CHF. Electronically Signed   By: Tollie Ethavid  Kwon M.D.   On: 06/28/2016 22:13   Dg Ribs Unilateral W/chest Left  Result Date: 06/28/2016 CLINICAL DATA:  Acute left lower rib pain EXAM: LEFT RIBS AND CHEST - 3+ VIEW COMPARISON:  None. FINDINGS: No  fracture or other bone lesions are seen involving the ribs. Status post median sternotomy and mitral valvular replacement. Layering left effusion with pulmonary vascular congestion consistent with CHF. IMPRESSION: No acute osseous abnormality. Changes of CHF with small left effusion and pulmonary vascular congestion. Electronically Signed   By: Tollie Ethavid  Kwon M.D.   On: 06/28/2016 22:15    Procedures Procedures (including critical care time)  Medications Ordered in ED Medications  furosemide (LASIX) injection 40 mg (40 mg Intravenous Given 06/29/16 0117)  traMADol (ULTRAM) tablet 50 mg (50 mg Oral Given 06/29/16 0117)    Initial Impression / Assessment and Plan / ED Course  I have reviewed the triage vital signs and the nursing notes.  Pertinent labs & imaging results that were available during my care of the patient were reviewed by me and considered in my medical decision making (see chart for details).  Clinical Course    Pt well appearing She has h/o MV repair as well as CHF She appears to have mild exacerbation here She was given lasix with appropriate urine output She ambulated without difficulty and without hypoxia She feels comfortable for d/c home  As for CP - she reports chest tightness with breathing but also left sided chest wall pain with cough/movement and it is reproducible with palpation She is smiling/laughing, no distress on re-eval I doubt ACS/PE/Dissection at this time Will d/c home Advised to call cardiologist for close f/u BP 111/72 (BP Location: Left Arm)   Pulse 82   Temp 98 F (36.7 C) (Oral)   Resp 16   Ht 5\' 4"  (1.626 m)   Wt 98 kg   LMP 06/20/2016   SpO2 100%   BMI 37.08 kg/m    Final Clinical Impressions(s) / ED Diagnoses   Final diagnoses:  Acute congestive heart failure, unspecified congestive heart failure type (HCC)    New Prescriptions New Prescriptions   TRAMADOL (ULTRAM) 50 MG TABLET    Take 1 tablet (50 mg total) by mouth every 12  (twelve) hours as needed.  I personally performed the services described in this documentation, which was scribed in my presence. The recorded information has been reviewed and is accurate.       Zadie Rhineonald Mikayah Joy,  MD 06/29/16 0320

## 2016-06-29 NOTE — ED Notes (Signed)
Pulse ox with ambulation 96%

## 2016-07-06 ENCOUNTER — Encounter: Payer: Self-pay | Admitting: Adult Health

## 2016-07-06 ENCOUNTER — Ambulatory Visit (INDEPENDENT_AMBULATORY_CARE_PROVIDER_SITE_OTHER): Payer: Medicaid Other | Admitting: Adult Health

## 2016-07-06 VITALS — BP 122/76 | HR 90 | Ht 64.0 in | Wt 224.0 lb

## 2016-07-06 DIAGNOSIS — I10 Essential (primary) hypertension: Secondary | ICD-10-CM | POA: Diagnosis not present

## 2016-07-06 DIAGNOSIS — I5032 Chronic diastolic (congestive) heart failure: Secondary | ICD-10-CM

## 2016-07-06 DIAGNOSIS — Z23 Encounter for immunization: Secondary | ICD-10-CM | POA: Diagnosis not present

## 2016-07-06 MED ORDER — BUMETANIDE 1 MG PO TABS
1.0000 mg | ORAL_TABLET | Freq: Two times a day (BID) | ORAL | 6 refills | Status: DC
Start: 2016-07-06 — End: 2016-10-12

## 2016-07-06 NOTE — Progress Notes (Signed)
Cardiology Office Note   Date:  07/06/2016   ID:  MUNACHIMSO RIGDON, DOB 1980-10-25, MRN [id number]  PCP:  Jerrell Belfast, [name]  Cardiologist: Arlington Calix, NP   Chief Complaint  Patient presents with  . Congestive Heart Failure     History of Present Illness: Theresa Crawford is a 35 y.o. female who presents for ongoing assessment and management of chronic diastolic heart failure, mitral regurgitation, with previous repair, pulmonary hypertension, hypertension, with recent admission l in the setting of shortness of breath, cough, fever, and hemoptysis. The patient was diagnosed with acute on chronic diastolic heart failure, anemia, and community-acquired pneumonia.  On 05/24/2016 the patient had a echo that showed EF of 55-60% mitral valve was poorly visualized and therefore a TEE was completed on 05/26/2016 revealing mitral annular ring present with mild to moderate stenosis. Mild regurg. No evidence of any significant abnormality. With diuresis patient's weight dropped from 235 pounds to 216 pounds. Cozaar, metoprolol, and Aldactone were decreased. She was sent home on Bumex 1 mg twice a day. Amlodipine, Plavix, Decadron, meclizine, and diclofenac were discontinued.   Procedures: Transesophageal echocardiogram performed on 05/26/2016 impression: - Left ventricle: Systolic function was normal. The estimated ejection fraction was in the range of 55% to 60%. Wall motion was normal; there were no regional wall motion abnormalities. - Mitral valve: Mitral annular ring present. Mild to moderate stenosis present. There was mild regurgitation. Mean velocity (D): 106 cm/s. Mean gradient (D): 5 mm Hg. Valve area by pressure half-time: 2.75 cm^2. Indexed valve area by pressure half-time: 1.26 cm^2/m^2. - Left atrium: The atrium was moderately dilated. - Right ventricle: Systolic function was mildly reduced. - Right atrium: The atrium was mildly dilated. - Atrial septum: No  defect or patent foramen ovale was identified. - Tricuspid valve: There was mild regurgitation. - Pulmonary arteries: PA peak pressure: 32 mm Hg (S).  She is here today with continued episodes of coughing, no significant shortness of breath. She continues to be medically compliant. She takes Bumex twice a day, she has gained approximately 8 pounds since discharge. She has been eating salted foods to include pizza and fast food.  Past Medical History:  Diagnosis Date  . CHF (congestive heart failure) (HCC)   . Congestive heart failure (HCC)   . History of open heart surgery   . Hypertension   . Mitral valve disease    mitral regurgitation    Past Surgical History:  Procedure Laterality Date  . ABDOMINAL HYSTERECTOMY    . CESAREAN SECTION     X's 2  . MITRAL VALVE REPAIR    . open heart surgery    . PARTIAL HYSTERECTOMY  2011   PID w/problem with fallopian tubes  . TEE WITHOUT CARDIOVERSION N/A 05/26/2016   Procedure: TRANSESOPHAGEAL ECHOCARDIOGRAM (TEE);  Surgeon: [name], MD;  Location: [facility];  Service: Cardiovascular;  Laterality: N/A;     Current Outpatient Prescriptions  Medication Sig Dispense Refill  . bumetanide (BUMEX) 1 MG tablet Take 1 tablet (1 mg total) by mouth 2 (two) times daily. 75 tablet 6  . citalopram (CELEXA) 40 MG tablet Take 40 mg by mouth 2 (two) times daily.     . cyclobenzaprine (FLEXERIL) 10 MG tablet Take 1 tablet (10 mg total) by mouth 3 (three) times daily. 20 tablet 0  . ferrous sulfate 325 (65 FE) MG tablet Take 325 mg by mouth 2 (two) times daily with a meal.    .  losartan (COZAAR) 25 MG tablet Take 1 tablet (25 mg total) by mouth daily. 30 tablet 0  . metoprolol succinate (TOPROL-XL) 25 MG 24 hr tablet Take 1 tablet (25 mg total) by mouth daily. 60 tablet 1  . QUEtiapine (SEROQUEL) 25 MG tablet Take 25 mg by mouth 2 (two) times daily.    Marland Kitchen. spironolactone (ALDACTONE) 25 MG tablet Take 0.5 tablets (12.5 mg total) by mouth daily.  30 tablet 1  . STOOL SOFTENER 100 MG capsule Take 100 mg by mouth daily.  3   No current facility-administered medications for this visit.     Allergies:   Lisinopril; Penicillins; Bee venom; Contrast media [iodinated diagnostic agents]; and Shellfish allergy    Social History:  The patient  reports that she has been smoking Cigarettes.  She started smoking about 16 years ago. She has been smoking about 0.25 packs per day. She has never used smokeless tobacco. She reports that she does not drink alcohol or use drugs.   Family History:  The patient's family history includes Heart disease in her paternal grandfather; Heart failure in her father.    ROS: All other systems are reviewed and negative. Unless otherwise mentioned in H&P    PHYSICAL EXAM: VS:  BP 122/76   Pulse 90   Ht 5\' 4"  (1.626 m)   Wt 224 lb (101.6 kg)   LMP 06/20/2016   SpO2 96%   BMI 38.45 kg/m  , BMI Body mass index is 38.45 kg/m. GEN: Well nourished, well developed, in no acute distress  HEENT: normal  Neck: no JVD, carotid bruits, or masses Cardiac: RRR; no murmurs, rubs, or gallops,no edema  Respiratory:  Clear to auscultation bilaterally, normal work of breathing, frequent coughing. GI: soft, nontender, nondistended, + BS MS: no deformity or atrophy  Skin: warm and dry, no rash Neuro:  Strength and sensation are intact Psych: euthymic mood, full affect  Recent Labs: 05/23/2016: B Natriuretic Peptide 642.0 06/28/2016: ALT 9; BUN 15; Creatinine, Ser 0.91; Hemoglobin 11.3; Platelets 327; Potassium 3.6; Sodium 136    Wt Readings from Last 3 Encounters:  07/06/16 224 lb (101.6 kg)  06/28/16 216 lb (98 kg)  05/27/16 216 lb 7.9 oz (98.2 kg)     ASSESSMENT AND PLAN:  1. Chronic diastolic heart failure: She is compliant with her medications but not necessarily with her diet. She continues to eat salted foods on occasion. I've advised her if she gains 3-5 pounds within a 24-hour period she may need to take  an extra dose of Bumex. I have given her a prescription for 75 tablets. She has been given instructions on low sodium diet.  2. Hypertension: Blood pressures currently well-controlled on medication regimen to include losartan 25 mg daily metoprolol 25 mg daily and spread lactone 25 mg daily. Continue current medication regimen.  3. Mitral valve disease: Per echocardiogram during recent hospitalization she has mitral annular ring present, with mild to moderate stenosis present. There was mild regurg. Continue to follow with annual echoes unless she becomes symptomatic.  Current medicines are reviewed at length with the patient today.    Labs/ tests ordered today include:  Orders Placed This Encounter  Procedures  . Flu Vaccine QUAD 36+ mos IM     Disposition:   FU with Dr. Wyline MoodBranch 3 months. Signed, Joni ReiningKathryn Ernie Kasler, NP  07/06/2016 4:16 PM    [facility] [address], [place], [place] [address] Phone: [phone]; Fax: [phone]

## 2016-07-06 NOTE — Progress Notes (Signed)
Name: Theresa Crawford    DOB: May 01, 1981  Age: 35 y.o.  MR#: [id number]       PCP:  Jerrell Belfast, [name]      Insurance: Payor: MEDICAID [place] / Plan: MEDICAID [place] ACCESS / Product Type: *No Product type* /   CC:   No chief complaint on file.   VS Vitals:   07/06/16 1457  BP: 122/76  Pulse: 90  SpO2: 96%  Weight: 224 lb (101.6 kg)  Height: 5\' 4"  (1.626 m)    Weights Current Weight  07/06/16 224 lb (101.6 kg)  06/28/16 216 lb (98 kg)  05/27/16 216 lb 7.9 oz (98.2 kg)    Blood Pressure  BP Readings from Last 3 Encounters:  07/06/16 122/76  06/29/16 102/63  05/27/16 114/74     Admit date:  (Not on file) Last encounter with R[facility]:  02/24/2016   Allergy Lisinopril; Penicillins; Bee venom; Contrast media [iodinated diagnostic agents]; and Shellfish allergy  Current Outpatient Prescriptions  Medication Sig Dispense Refill  . bumetanide (BUMEX) 1 MG tablet Take 1 mg by mouth 2 (two) times daily.  3  . citalopram (CELEXA) 40 MG tablet Take 40 mg by mouth 2 (two) times daily.     . cyclobenzaprine (FLEXERIL) 10 MG tablet Take 1 tablet (10 mg total) by mouth 3 (three) times daily. 20 tablet 0  . ferrous sulfate 325 (65 FE) MG tablet Take 325 mg by mouth 2 (two) times daily with a meal.    . losartan (COZAAR) 25 MG tablet Take 1 tablet (25 mg total) by mouth daily. 30 tablet 0  . metoprolol succinate (TOPROL-XL) 25 MG 24 hr tablet Take 1 tablet (25 mg total) by mouth daily. 60 tablet 1  . QUEtiapine (SEROQUEL) 25 MG tablet Take 25 mg by mouth 2 (two) times daily.    Marland Kitchen spironolactone (ALDACTONE) 25 MG tablet Take 0.5 tablets (12.5 mg total) by mouth daily. 30 tablet 1  . STOOL SOFTENER 100 MG capsule Take 100 mg by mouth daily.  3   No current facility-administered medications for this visit.     Discontinued Meds:    Medications Discontinued During This Encounter  Medication Reason  . levofloxacin (LEVAQUIN) 750 MG tablet Error  . traMADol (ULTRAM) 50 MG tablet Error     Patient Active Problem List   Diagnosis Date Noted  . S/P mitral valve repair   . CAP (community acquired pneumonia) 05/25/2016  . Leukocytosis 05/24/2016  . Acute respiratory failure with hypoxia (HCC) 05/23/2016  . Acute on chronic diastolic CHF (congestive heart failure) (HCC) 05/23/2016  . Cough with hemoptysis 05/23/2016  . Postpartum complication pericarditis in 2009 with eventual needing MVR in 2015 05/23/2016  . Anemia, iron deficiency 05/23/2016  . Hypertension   . SOB (shortness of breath)   . Respiratory distress 02/16/2016  . Essential hypertension 02/16/2016  . Congestive heart failure (HCC) 11/26/2015    LABS    Component Value Date/Time   NA 136 06/28/2016 2150   NA 137 05/27/2016 0436   NA 135 05/26/2016 0414   K 3.6 06/28/2016 2150   K 3.7 05/27/2016 0436   K 3.6 05/26/2016 0414   CL 101 06/28/2016 2150   CL 105 05/27/2016 0436   CL 103 05/26/2016 0414   CO2 28 06/28/2016 2150   CO2 24 05/27/2016 0436   CO2 23 05/26/2016 0414   GLUCOSE 90 06/28/2016 2150   GLUCOSE 103 (H) 05/27/2016 0436   GLUCOSE 118 (H) 05/26/2016 0414  BUN 15 06/28/2016 2150   BUN 18 05/27/2016 0436   BUN 18 05/26/2016 0414   CREATININE 0.91 06/28/2016 2150   CREATININE 0.88 05/27/2016 0436   CREATININE 0.89 05/26/2016 0414   CALCIUM 8.9 06/28/2016 2150   CALCIUM 9.0 05/27/2016 0436   CALCIUM 8.8 (L) 05/26/2016 0414   GFRNONAA >60 06/28/2016 2150   GFRNONAA >60 05/27/2016 0436   GFRNONAA >60 05/26/2016 0414   GFRAA >60 06/28/2016 2150   GFRAA >60 05/27/2016 0436   GFRAA >60 05/26/2016 0414   CMP     Component Value Date/Time   NA 136 06/28/2016 2150   K 3.6 06/28/2016 2150   CL 101 06/28/2016 2150   CO2 28 06/28/2016 2150   GLUCOSE 90 06/28/2016 2150   BUN 15 06/28/2016 2150   CREATININE 0.91 06/28/2016 2150   CALCIUM 8.9 06/28/2016 2150   PROT 7.7 06/28/2016 2150   ALBUMIN 4.0 06/28/2016 2150   AST 13 (L) 06/28/2016 2150   ALT 9 (L) 06/28/2016 2150    ALKPHOS 59 06/28/2016 2150   BILITOT 0.5 06/28/2016 2150   GFRNONAA >60 06/28/2016 2150   GFRAA >60 06/28/2016 2150       Component Value Date/Time   WBC 11.2 (H) 06/28/2016 2150   WBC 6.9 05/26/2016 0414   WBC 12.3 (H) 05/25/2016 0345   HGB 11.3 (L) 06/28/2016 2150   HGB 9.7 (L) 05/26/2016 0414   HGB 8.8 (L) 05/25/2016 0345   HCT 34.8 (L) 06/28/2016 2150   HCT 30.6 (L) 05/26/2016 0414   HCT 27.4 (L) 05/25/2016 0345   MCV 82.1 06/28/2016 2150   MCV 79.9 05/26/2016 0414   MCV 79.7 05/25/2016 0345    Lipid Panel  No results found for: CHOL, TRIG, HDL, CHOLHDL, VLDL, LDLCALC, LDLDIRECT  ABG    Component Value Date/Time   PHART 7.447 05/23/2016 2120   PCO2ART 33.4 05/23/2016 2120   PO2ART 73.0 (L) 05/23/2016 2120   HCO3 24.0 05/23/2016 2120   ACIDBASEDEF 0.9 05/23/2016 2120   O2SAT 95.0 05/23/2016 2120     No results found for: TSH BNP (last 3 results)  Recent Labs  04/26/16 1927 05/23/16 1752  BNP 99.0 642.0*    ProBNP (last 3 results) No results for input(s): PROBNP in the last 8760 hours.  Cardiac Panel (last 3 results) No results for input(s): CKTOTAL, CKMB, TROPONINI, RELINDX in the last 72 hours.  Iron/TIBC/Ferritin/ %Sat    Component Value Date/Time   IRON 6 (L) 05/24/2016 0059   TIBC 207 (L) 05/24/2016 0059   FERRITIN 63 05/24/2016 0059   IRONPCTSAT 3 (L) 05/24/2016 0059     EKG Orders placed or performed during the hospital encounter of 06/28/16  . ED EKG  . ED EKG  . EKG 12-Lead  . EKG 12-Lead  . EKG     Prior Assessment and Plan Problem List as of 07/06/2016 Reviewed: 02/24/2016  2:49 PM by Joni Reining, NP     Cardiovascular and Mediastinum   Congestive heart failure [facility])   Last Assessment & Plan 11/26/2015 Erroneous Encounter Written 11/26/2015  2:40 PM by Dyann Kief, PA-C    No show      Essential hypertension   Acute on chronic diastolic CHF (congestive heart failure) (HCC)   Hypertension     Respiratory   Acute  respiratory failure with hypoxia (HCC)   CAP (community acquired pneumonia)     Other   Respiratory distress   Cough with hemoptysis   Postpartum complication pericarditis in  2009 with eventual needing MVR in 2015   Anemia, iron deficiency   SOB (shortness of breath)   Leukocytosis   S/P mitral valve repair       Imaging: Dg Chest 2 View  Result Date: 06/28/2016 CLINICAL DATA:  Dyspnea and cough, left lower rib pain. EXAM: CHEST  2 VIEW COMPARISON:  CT and chest radiograph from 05/23/2016. FINDINGS: Stable cardiomegaly with median sternotomy sutures and mitral valvular replacement as before. Diffuse hazy appearance of the lungs suggestive of CHF and likely layering effusions. No acute osseous abnormality. IMPRESSION: Cardiomegaly with changes secondary to mild CHF. Electronically Signed   By: Tollie Ethavid  Kwon M.D.   On: 06/28/2016 22:13   Dg Ribs Unilateral W/chest Left  Result Date: 06/28/2016 CLINICAL DATA:  Acute left lower rib pain EXAM: LEFT RIBS AND CHEST - 3+ VIEW COMPARISON:  None. FINDINGS: No fracture or other bone lesions are seen involving the ribs. Status post median sternotomy and mitral valvular replacement. Layering left effusion with pulmonary vascular congestion consistent with CHF. IMPRESSION: No acute osseous abnormality. Changes of CHF with small left effusion and pulmonary vascular congestion. Electronically Signed   By: Tollie Ethavid  Kwon M.D.   On: 06/28/2016 22:15

## 2016-07-06 NOTE — Patient Instructions (Signed)
Your physician recommends that you schedule a follow-up appointment with Dr. Purvis SheffieldKoneswaran in 3 Months.   Your physician recommends that you continue on your current medications as directed. Please refer to the Current Medication list given to you today.  If you need a refill on your cardiac medications before your next appointment, please call your pharmacy.  Thank you for choosing [facility]!

## 2016-09-25 ENCOUNTER — Emergency Department (HOSPITAL_COMMUNITY)
Admission: EM | Admit: 2016-09-25 | Discharge: 2016-09-25 | Disposition: A | Discharge status: Home / Self Care | Payer: Medicaid Other | Attending: Emergency Medicine | Admitting: Emergency Medicine

## 2016-09-25 ENCOUNTER — Encounter (HOSPITAL_COMMUNITY): Payer: Self-pay | Admitting: Emergency Medicine

## 2016-09-25 DIAGNOSIS — I5033 Acute on chronic diastolic (congestive) heart failure: Secondary | ICD-10-CM | POA: Diagnosis not present

## 2016-09-25 DIAGNOSIS — M5432 Sciatica, left side: Secondary | ICD-10-CM

## 2016-09-25 DIAGNOSIS — M545 Low back pain: Secondary | ICD-10-CM | POA: Diagnosis present

## 2016-09-25 DIAGNOSIS — M5442 Lumbago with sciatica, left side: Secondary | ICD-10-CM | POA: Diagnosis not present

## 2016-09-25 DIAGNOSIS — I110 Hypertensive heart disease with heart failure: Secondary | ICD-10-CM | POA: Insufficient documentation

## 2016-09-25 DIAGNOSIS — Z79899 Other long term (current) drug therapy: Secondary | ICD-10-CM | POA: Diagnosis not present

## 2016-09-25 DIAGNOSIS — F17210 Nicotine dependence, cigarettes, uncomplicated: Secondary | ICD-10-CM | POA: Insufficient documentation

## 2016-09-25 MED ORDER — HYDROCODONE-ACETAMINOPHEN 5-325 MG PO TABS
1.0000 | ORAL_TABLET | Freq: Once | ORAL | Status: AC
  Administered 2016-09-25: 1 via ORAL
  Filled 2016-09-25: qty 1

## 2016-09-25 MED ORDER — HYDROCODONE-ACETAMINOPHEN 5-325 MG PO TABS: 1.0000 | ORAL_TABLET | ORAL | 0 refills | Status: DC | PRN

## 2016-09-25 MED ORDER — KETOROLAC TROMETHAMINE 30 MG/ML IJ SOLN: 30.0000 mg | Freq: Once | INTRAMUSCULAR | Status: DC

## 2016-09-25 MED ORDER — DICLOFENAC SODIUM 75 MG PO TBEC: 75.0000 mg | DELAYED_RELEASE_TABLET | Freq: Two times a day (BID) | ORAL | 0 refills | Status: DC

## 2016-09-25 MED ORDER — CYCLOBENZAPRINE HCL 5 MG PO TABS: 5.0000 mg | ORAL_TABLET | Freq: Three times a day (TID) | ORAL | 0 refills | Status: DC | PRN

## 2016-09-25 MED ORDER — KETOROLAC TROMETHAMINE 30 MG/ML IJ SOLN
30.0000 mg | Freq: Once | INTRAMUSCULAR | Status: AC
  Administered 2016-09-25: 30 mg via INTRAMUSCULAR
  Filled 2016-09-25: qty 1

## 2016-09-25 NOTE — ED Triage Notes (Signed)
Pt states she gets sciatica from time to time and today it began bothering her on the left side going down left leg.

## 2016-09-25 NOTE — Discharge Instructions (Signed)
Do not drive within 4 hours of taking hydrocodone as this can make you drowsy.  Avoid lifting,  Bending,  Twisting or any other activity that worsens your pain over the next week.  Apply a heating pad to your lower back for 20 minutes several times daily.  You should get rechecked if your symptoms are not better over the next 5 days,  Or you develop increased pain,  Weakness in your leg(s) or loss of bladder or bowel function - these can be signs of a worsening condition.

## 2016-09-25 NOTE — ED Provider Notes (Signed)
[facility] Provider Note   CSN: [id number] Arrival date & time: 09/25/16  1724     History   Chief Complaint Chief Complaint  Patient presents with  . Back Pain    HPI Theresa Crawford is a 36 y.o. female presenting with acute on chronic low back pain which has which has been present for the past day.   Patient denies any new injury specifically.  There is radiation of pain into her left toes, stating with her last previous episode her pain radiated in the right lower leg.  There has been no weakness or numbness in the lower extremities and no urinary or bowel retention or incontinence.  Patient does not have a history of cancer or IVDU. She had a CT scan done here August 2017 indicating L4-S1 disk degenerative disease along with osteophyte formation.  She is awaiting referral by her pcp to a back specialist.  In the interim,  She has found no alleviators for her current episode of pain.  She has used heat and rest without significant relief of symptoms.   The history is provided by the patient.    Past Medical History:  Diagnosis Date  . CHF (congestive heart failure) (HCC)   . Congestive heart failure (HCC)   . History of open heart surgery   . Hypertension   . Mitral valve disease    mitral regurgitation    Patient Active Problem List   Diagnosis Date Noted  . S/P mitral valve repair   . CAP (community acquired pneumonia) 05/25/2016  . Leukocytosis 05/24/2016  . Acute respiratory failure with hypoxia (HCC) 05/23/2016  . Acute on chronic diastolic CHF (congestive heart failure) (HCC) 05/23/2016  . Cough with hemoptysis 05/23/2016  . Postpartum complication pericarditis in 2009 with eventual needing MVR in 2015 05/23/2016  . Anemia, iron deficiency 05/23/2016  . Hypertension   . SOB (shortness of breath)   . Respiratory distress 02/16/2016  . Essential hypertension 02/16/2016  . Congestive heart failure (HCC) 11/26/2015    Past Surgical History:  Procedure  Laterality Date  . ABDOMINAL HYSTERECTOMY    . CESAREAN SECTION     X's 2  . MITRAL VALVE REPAIR    . open heart surgery    . PARTIAL HYSTERECTOMY  2011   PID w/problem with fallopian tubes  . TEE WITHOUT CARDIOVERSION N/A 05/26/2016   Procedure: TRANSESOPHAGEAL ECHOCARDIOGRAM (TEE);  Surgeon: [name], MD;  Location: [facility];  Service: Cardiovascular;  Laterality: N/A;    OB History    Gravida Para Term Preterm AB Living   2 2 2          SAB TAB Ectopic Multiple Live Births                   Home Medications    Prior to Admission medications   Medication Sig Start Date End Date Taking? Authorizing Provider  bumetanide (BUMEX) 1 MG tablet Take 1 tablet (1 mg total) by mouth 2 (two) times daily. 07/06/16   Jodelle Gross, NP  citalopram (CELEXA) 40 MG tablet Take 40 mg by mouth 2 (two) times daily.     Historical Provider, MD  cyclobenzaprine (FLEXERIL) 10 MG tablet Take 1 tablet (10 mg total) by mouth 3 (three) times daily. 05/10/16   Ivery Quale, PA-C  ferrous sulfate 325 (65 FE) MG tablet Take 325 mg by mouth 2 (two) times daily with a meal.    Historical Provider, MD  losartan (COZAAR)  25 MG tablet Take 1 tablet (25 mg total) by mouth daily. 05/28/16   Jeralyn Bennett, MD  metoprolol succinate (TOPROL-XL) 25 MG 24 hr tablet Take 1 tablet (25 mg total) by mouth daily. 05/28/16   Jeralyn Bennett, MD  QUEtiapine (SEROQUEL) 25 MG tablet Take 25 mg by mouth 2 (two) times daily.    Historical Provider, MD  spironolactone (ALDACTONE) 25 MG tablet Take 0.5 tablets (12.5 mg total) by mouth daily. 05/28/16   Jeralyn Bennett, MD  STOOL SOFTENER 100 MG capsule Take 100 mg by mouth daily. 10/29/15   Historical Provider, MD    Family History Family History  Problem Relation Age of Onset  . Heart failure Father     just received LVAD  . Heart disease Paternal Grandfather     stent placement    Social History Social History  Substance Use Topics  . Smoking status:  Current Every Day Smoker    Packs/day: 0.25    Types: Cigarettes    Start date: 11/03/1999  . Smokeless tobacco: Never Used  . Alcohol use No     Allergies   Lisinopril; Penicillins; Bee venom; Contrast media [iodinated diagnostic agents]; and Shellfish allergy   Review of Systems Review of Systems  Constitutional: Negative for fever.  Respiratory: Negative for shortness of breath.   Cardiovascular: Negative for chest pain and leg swelling.  Gastrointestinal: Negative for abdominal distention, abdominal pain and constipation.  Genitourinary: Negative for difficulty urinating, dysuria, flank pain, frequency and urgency.  Musculoskeletal: Positive for back pain. Negative for gait problem and joint swelling.  Skin: Negative for rash.  Neurological: Negative for weakness and numbness.     Physical Exam Updated Vital Signs BP (!) 109/52 (BP Location: Left Arm)   Pulse 88   Temp 97.7 F (36.5 C) (Oral)   Resp 18   Ht 5\' 4"  (1.626 m)   Wt 97.5 kg   LMP 09/08/2016   SpO2 100%   BMI 36.90 kg/m   Physical Exam  Constitutional: She appears well-developed and well-nourished.  HENT:  Head: Normocephalic.  Eyes: Conjunctivae are normal.  Neck: Normal range of motion. Neck supple.  Cardiovascular: Normal rate and intact distal pulses.   Pedal pulses normal.  Pulmonary/Chest: Effort normal.  Abdominal: Soft. Bowel sounds are normal. She exhibits no distension and no mass.  Musculoskeletal: Normal range of motion. She exhibits no edema.       Lumbar back: She exhibits tenderness. She exhibits no swelling, no edema and no spasm.  Neurological: She is alert. She has normal strength. She displays no atrophy and no tremor. No sensory deficit. Gait normal.  Reflex Scores:      Patellar reflexes are 2+ on the right side and 2+ on the left side.      Achilles reflexes are 2+ on the right side and 2+ on the left side. No strength deficit noted in hip and knee flexor and extensor muscle  groups.  Ankle flexion and extension intact.  Skin: Skin is warm and dry.  Psychiatric: She has a normal mood and affect.  Nursing note and vitals reviewed.    ED Treatments / Results  Labs (all labs ordered are listed, but only abnormal results are displayed) Labs Reviewed - No data to display  EKG  EKG Interpretation None       Radiology No results found.  Procedures Procedures (including critical care time)  Medications Ordered in ED Medications - No data to display   Initial Impression / Assessment  and Plan / ED Course  I have reviewed the triage vital signs and the nursing notes.  Pertinent labs & imaging results that were available during my care of the patient were reviewed by me and considered in my medical decision making (see chart for details).  Clinical Course    No neuro deficit on exam or by history to suggest emergent or surgical presentation.  Discussed worsened sx that should prompt immediate re-evaluation including distal weakness, bowel/bladder retention/incontinence.  Review of CT scan from 05/09/16 indicates patient would benefit from seeing a back specialist.  Encouraged f/u as she is currently pursuing.  Referral will need to be from pcp given insurance requirements.    Pt was placed on diclofenac, flexeril which has been helpful with prior episode.  Few hydrocodone also given for pain relief.          Final Clinical Impressions(s) / ED Diagnoses   Final diagnoses:  None    New Prescriptions New Prescriptions   No medications on file     Burgess AmorJulie Onis Markoff, PA-C 09/25/16 1900    Eber HongBrian Miller, MD 09/26/16 [phone]

## 2016-10-06 ENCOUNTER — Encounter: Payer: Self-pay | Admitting: Cardiovascular Disease

## 2016-10-06 ENCOUNTER — Ambulatory Visit (INDEPENDENT_AMBULATORY_CARE_PROVIDER_SITE_OTHER): Payer: Medicaid Other | Admitting: Cardiovascular Disease

## 2016-10-06 VITALS — BP 106/64 | HR 101 | Ht 64.0 in | Wt 220.0 lb

## 2016-10-06 DIAGNOSIS — I050 Rheumatic mitral stenosis: Secondary | ICD-10-CM

## 2016-10-06 DIAGNOSIS — I10 Essential (primary) hypertension: Secondary | ICD-10-CM | POA: Diagnosis not present

## 2016-10-06 DIAGNOSIS — R000 Tachycardia, unspecified: Secondary | ICD-10-CM

## 2016-10-06 DIAGNOSIS — Z9889 Other specified postprocedural states: Secondary | ICD-10-CM | POA: Diagnosis not present

## 2016-10-06 DIAGNOSIS — R0609 Other forms of dyspnea: Secondary | ICD-10-CM

## 2016-10-06 DIAGNOSIS — I5032 Chronic diastolic (congestive) heart failure: Secondary | ICD-10-CM | POA: Diagnosis not present

## 2016-10-06 MED ORDER — METOPROLOL SUCCINATE ER 50 MG PO TB24: 50.0000 mg | ORAL_TABLET | Freq: Every day | ORAL | 3 refills | Status: DC

## 2016-10-06 NOTE — Progress Notes (Signed)
SUBJECTIVE: The patient presents for follow-up of chronic diastolic heart failure and mitral valve stenosis with h/o repair.  TEE 05/26/16: Normal left ventricular systolic function and regional wall motion, LVEF 55-60%, mild to moderate mitral valve stenosis with mild regurgitation, mean gradient 5 mmHg, moderate left atrial dilatation, mildly reduced right ventricular systolic function, mild right atrial dilatation, mild tricuspid regurgitation, PA S/P 32 mmHg.  Wt 220 lbs (224 lbs 07/06/16).  She denies leg swelling. She has a mild degree of orthopnea. She was walking to the store the other day which is an approximately 10-15 minute walk and felt short of breath 3 minutes into this walk. It was on level ground. She has some chest pains with deep inspiration.  Heart rate 101 bpm, blood pressure 106/64.     Review of Systems: As per "subjective", otherwise negative.  Allergies  Allergen Reactions  . Lisinopril Shortness Of Breath and Swelling    Throat swelling  . Penicillins Shortness Of Breath and Swelling    Has patient had a PCN reaction causing immediate rash, facial/tongue/throat swelling, SOB or lightheadedness with hypotension: Yes Has patient had a PCN reaction causing severe rash involving mucus membranes or skin necrosis: Yes Has patient had a PCN reaction that required hospitalization No Has patient had a PCN reaction occurring within the last 10 years: No If all of the above answers are "NO", then may proceed with Cephalosporin use.   . Bee Venom   . Contrast Media [Iodinated Diagnostic Agents]     difinity  . Shellfish Allergy     Current Outpatient Prescriptions  Medication Sig Dispense Refill  . bumetanide (BUMEX) 1 MG tablet Take 1 tablet (1 mg total) by mouth 2 (two) times daily. 75 tablet 6  . citalopram (CELEXA) 40 MG tablet Take 40 mg by mouth 2 (two) times daily.     . cyclobenzaprine (FLEXERIL) 5 MG tablet Take 1 tablet (5 mg total) by mouth 3  (three) times daily as needed for muscle spasms. 15 tablet 0  . diclofenac (VOLTAREN) 75 MG EC tablet Take 1 tablet (75 mg total) by mouth 2 (two) times daily. 20 tablet 0  . ferrous sulfate 325 (65 FE) MG tablet Take 325 mg by mouth 2 (two) times daily with a meal.    . HYDROcodone-acetaminophen (NORCO/VICODIN) 5-325 MG tablet Take 1 tablet by mouth every 4 (four) hours as needed. 10 tablet 0  . losartan (COZAAR) 25 MG tablet Take 1 tablet (25 mg total) by mouth daily. 30 tablet 0  . metoprolol succinate (TOPROL-XL) 25 MG 24 hr tablet Take 1 tablet (25 mg total) by mouth daily. 60 tablet 1  . QUEtiapine (SEROQUEL) 25 MG tablet Take 25 mg by mouth 2 (two) times daily.    Marland Kitchen. spironolactone (ALDACTONE) 25 MG tablet Take 0.5 tablets (12.5 mg total) by mouth daily. 30 tablet 1  . STOOL SOFTENER 100 MG capsule Take 100 mg by mouth daily.  3   No current facility-administered medications for this visit.     Past Medical History:  Diagnosis Date  . CHF (congestive heart failure) (HCC)   . Congestive heart failure (HCC)   . History of open heart surgery   . Hypertension   . Mitral valve disease    mitral regurgitation    Past Surgical History:  Procedure Laterality Date  . ABDOMINAL HYSTERECTOMY    . CESAREAN SECTION     X's 2  . MITRAL VALVE REPAIR    .  open heart surgery    . PARTIAL HYSTERECTOMY  2011   PID w/problem with fallopian tubes  . TEE WITHOUT CARDIOVERSION N/A 05/26/2016   Procedure: TRANSESOPHAGEAL ECHOCARDIOGRAM (TEE);  Surgeon: [name], MD;  Location: [facility];  Service: Cardiovascular;  Laterality: N/A;    Social History   Social History  . Marital status: Unknown    Spouse name: N/A  . Number of children: N/A  . Years of education: N/A   Occupational History  . Not on file.   Social History Main Topics  . Smoking status: Current Some Day Smoker    Packs/day: 0.25    Types: Cigarettes    Start date: 11/03/1999  . Smokeless tobacco: Never Used   . Alcohol use No  . Drug use: No  . Sexual activity: Not on file   Other Topics Concern  . Not on file   Social History Narrative  . No narrative on file     Vitals:   10/06/16 0944  BP: 106/64  Pulse: (!) 101  SpO2: 98%  Weight: 220 lb (99.8 kg)  Height: 5\' 4"  (1.626 m)    PHYSICAL EXAM General: NAD HEENT: Normal. Neck: No JVD, no thyromegaly. Lungs: Clear to auscultation bilaterally with normal respiratory effort. CV: Nondisplaced PMI.  Tachycardic, regular rhythm, normal S1/S2, no S3/S4, 2/6 apical holosystolic murmur. No pretibial or periankle edema. Abdomen: Soft, nontender, no distention.  Neurologic: Alert and oriented.  Psych: Normal affect. Skin: Normal. Musculoskeletal: No gross deformities.    ECG: Most recent ECG reviewed.      ASSESSMENT AND PLAN: 1. Chronic diastolic heart failure with exertional dyspnea: On Bumex 2 mg twice daily and spironolactone 12.5 mg daily. Euvolemic. Will stop spironolactone to allow for increase in Toprol-XL to 50 mg daily as she is tachycardic and I do not want to precipitate hypotension.  2. Mitral valve stenosis with h/o repair: Mild to moderate in 05/2016.  3. Hypertension: Low normal on losartan 25 mg daily. I will stop spironolactone to allow for increase in Toprol-XL to 50 mg daily as she is tachycardic and I do not want to precipitate hypotension.  4. Tachycardia: Increase Toprol-XL to 50 mg daily.  Dispo: fu 3 months  Time spent: 40 minutes, of which greater than 50% was spent reviewing symptoms, relevant blood tests and studies, and discussing management plan with the patient.   Prentice Docker, M.D., F.A.C.C.

## 2016-10-06 NOTE — Patient Instructions (Signed)
Medication Instructions:  STOP SPIRONOLACTONE  INCREASE TOPROL XL TO 50 MG DAILY   Labwork: NONE  Testing/Procedures: NONE  Follow-Up: Your physician recommends that you schedule a follow-up appointment in: 3 MONTHS    Any Other Special Instructions Will Be Listed Below (If Applicable).     If you need a refill on your cardiac medications before your next appointment, please call your pharmacy.

## 2016-10-10 ENCOUNTER — Inpatient Hospital Stay (HOSPITAL_COMMUNITY): Payer: Medicaid Other

## 2016-10-10 ENCOUNTER — Emergency Department (HOSPITAL_COMMUNITY): Payer: Medicaid Other

## 2016-10-10 ENCOUNTER — Encounter (HOSPITAL_COMMUNITY): Payer: Self-pay | Admitting: Emergency Medicine

## 2016-10-10 ENCOUNTER — Inpatient Hospital Stay (HOSPITAL_COMMUNITY)
Admission: EM | Admit: 2016-10-10 | Discharge: 2016-10-12 | DRG: 291 | Disposition: A | Discharge status: Home / Self Care | Payer: Medicaid Other | Attending: Internal Medicine | Admitting: Internal Medicine

## 2016-10-10 DIAGNOSIS — O9089 Other complications of the puerperium, not elsewhere classified: Secondary | ICD-10-CM | POA: Diagnosis present

## 2016-10-10 DIAGNOSIS — Z91013 Allergy to seafood: Secondary | ICD-10-CM

## 2016-10-10 DIAGNOSIS — Z91030 Bee allergy status: Secondary | ICD-10-CM

## 2016-10-10 DIAGNOSIS — I5033 Acute on chronic diastolic (congestive) heart failure: Secondary | ICD-10-CM | POA: Diagnosis present

## 2016-10-10 DIAGNOSIS — F17210 Nicotine dependence, cigarettes, uncomplicated: Secondary | ICD-10-CM | POA: Diagnosis present

## 2016-10-10 DIAGNOSIS — I081 Rheumatic disorders of both mitral and tricuspid valves: Secondary | ICD-10-CM | POA: Diagnosis present

## 2016-10-10 DIAGNOSIS — R0602 Shortness of breath: Secondary | ICD-10-CM | POA: Diagnosis not present

## 2016-10-10 DIAGNOSIS — Z8249 Family history of ischemic heart disease and other diseases of the circulatory system: Secondary | ICD-10-CM

## 2016-10-10 DIAGNOSIS — Z888 Allergy status to other drugs, medicaments and biological substances status: Secondary | ICD-10-CM | POA: Diagnosis not present

## 2016-10-10 DIAGNOSIS — Z91041 Radiographic dye allergy status: Secondary | ICD-10-CM | POA: Diagnosis not present

## 2016-10-10 DIAGNOSIS — J9601 Acute respiratory failure with hypoxia: Secondary | ICD-10-CM | POA: Diagnosis present

## 2016-10-10 DIAGNOSIS — I110 Hypertensive heart disease with heart failure: Principal | ICD-10-CM | POA: Diagnosis present

## 2016-10-10 DIAGNOSIS — Z880 Allergy status to penicillin: Secondary | ICD-10-CM

## 2016-10-10 DIAGNOSIS — Z9889 Other specified postprocedural states: Secondary | ICD-10-CM | POA: Diagnosis not present

## 2016-10-10 DIAGNOSIS — R0902 Hypoxemia: Secondary | ICD-10-CM

## 2016-10-10 DIAGNOSIS — I050 Rheumatic mitral stenosis: Secondary | ICD-10-CM | POA: Diagnosis not present

## 2016-10-10 DIAGNOSIS — I959 Hypotension, unspecified: Secondary | ICD-10-CM | POA: Diagnosis not present

## 2016-10-10 DIAGNOSIS — J810 Acute pulmonary edema: Secondary | ICD-10-CM

## 2016-10-10 LAB — MAGNESIUM: MAGNESIUM: 1.7 mg/dL (ref 1.7–2.4)

## 2016-10-10 LAB — COMPREHENSIVE METABOLIC PANEL
ALBUMIN: 3.9 g/dL (ref 3.5–5.0)
ALT: 14 U/L (ref 14–54)
AST: 21 U/L (ref 15–41)
Alkaline Phosphatase: 57 U/L (ref 38–126)
Anion gap: 8 (ref 5–15)
BILIRUBIN TOTAL: 0.9 mg/dL (ref 0.3–1.2)
BUN: 15 mg/dL (ref 6–20)
CHLORIDE: 101 mmol/L (ref 101–111)
CO2: 30 mmol/L (ref 22–32)
Calcium: 9.2 mg/dL (ref 8.9–10.3)
Creatinine, Ser: 0.79 mg/dL (ref 0.44–1.00)
GFR calc Af Amer: >60 mL/min (ref >60)
GFR calc non Af Amer: >60 mL/min (ref >60)
GLUCOSE: 121 mg/dL — AB (ref 65–99)
POTASSIUM: 2.7 mmol/L — AB (ref 3.5–5.1)
Sodium: 139 mmol/L (ref 135–145)
Total Protein: 7.5 g/dL (ref 6.5–8.1)

## 2016-10-10 LAB — CBC WITH DIFFERENTIAL/PLATELET
BASOS ABS: 0 10*3/uL (ref 0.0–0.1)
Basophils Relative: 0 %
Eosinophils Absolute: 0.2 10*3/uL (ref 0.0–0.7)
Eosinophils Relative: 1 %
HEMATOCRIT: 35.5 % — AB (ref 36.0–46.0)
Hemoglobin: 11.8 g/dL — ABNORMAL LOW (ref 12.0–15.0)
Lymphocytes Relative: 12 %
Lymphs Abs: 2.1 10*3/uL (ref 0.7–4.0)
MCH: 26.9 pg (ref 26.0–34.0)
MCHC: 33.2 g/dL (ref 30.0–36.0)
MCV: 80.9 fL (ref 78.0–100.0)
MONO ABS: 0.6 10*3/uL (ref 0.1–1.0)
Monocytes Relative: 3 %
NEUTROS ABS: 15.1 10*3/uL — AB (ref 1.7–7.7)
NEUTROS PCT: 84 %
PLATELETS: 325 10*3/uL (ref 150–400)
RBC: 4.39 MIL/uL (ref 3.87–5.11)
RDW: 15.2 % (ref 11.5–15.5)
WBC: 18 10*3/uL — ABNORMAL HIGH (ref 4.0–10.5)

## 2016-10-10 LAB — ECHOCARDIOGRAM COMPLETE
HEIGHTINCHES: 64 in
WEIGHTICAEL: 3643.76 [oz_av]

## 2016-10-10 LAB — TROPONIN I
Troponin I: <0.03 ng/mL (ref <0.03)
Troponin I: <0.03 ng/mL (ref <0.03)
Troponin I: <0.03 ng/mL (ref <0.03)

## 2016-10-10 LAB — BRAIN NATRIURETIC PEPTIDE: B Natriuretic Peptide: 257 pg/mL — ABNORMAL HIGH (ref 0.0–100.0)

## 2016-10-10 LAB — D-DIMER, QUANTITATIVE: D-Dimer, Quant: 2.93 ug/mL-FEU — ABNORMAL HIGH (ref 0.00–0.50)

## 2016-10-10 LAB — MRSA PCR SCREENING: MRSA BY PCR: NEGATIVE

## 2016-10-10 MED ORDER — CITALOPRAM HYDROBROMIDE 20 MG PO TABS
40.0000 mg | ORAL_TABLET | Freq: Two times a day (BID) | ORAL | Status: DC
  Administered 2016-10-10 – 2016-10-12 (×4): 40 mg via ORAL
  Filled 2016-10-10 (×4): qty 2

## 2016-10-10 MED ORDER — SODIUM CHLORIDE 0.9 % IV SOLN: 250.0000 mL | INTRAVENOUS | Status: DC | PRN

## 2016-10-10 MED ORDER — ACETAMINOPHEN 325 MG PO TABS
650.0000 mg | ORAL_TABLET | ORAL | Status: DC | PRN
  Administered 2016-10-10: 650 mg via ORAL
  Filled 2016-10-10: qty 2

## 2016-10-10 MED ORDER — POTASSIUM CHLORIDE CRYS ER 20 MEQ PO TBCR
40.0000 meq | EXTENDED_RELEASE_TABLET | Freq: Once | ORAL | Status: AC
  Administered 2016-10-10: 40 meq via ORAL
  Filled 2016-10-10: qty 2

## 2016-10-10 MED ORDER — POTASSIUM CHLORIDE CRYS ER 20 MEQ PO TBCR
40.0000 meq | EXTENDED_RELEASE_TABLET | ORAL | Status: AC
Start: 2016-10-10 — End: 2016-10-11
  Administered 2016-10-10 – 2016-10-11 (×4): 40 meq via ORAL
  Filled 2016-10-10 (×4): qty 2

## 2016-10-10 MED ORDER — POTASSIUM CHLORIDE CRYS ER 20 MEQ PO TBCR: 40.0000 meq | EXTENDED_RELEASE_TABLET | Freq: Two times a day (BID) | ORAL | Status: DC

## 2016-10-10 MED ORDER — FUROSEMIDE 10 MG/ML IJ SOLN
40.0000 mg | Freq: Once | INTRAMUSCULAR | Status: AC
  Administered 2016-10-10: 40 mg via INTRAVENOUS
  Filled 2016-10-10: qty 4

## 2016-10-10 MED ORDER — ONDANSETRON HCL 4 MG/2ML IJ SOLN: 4.0000 mg | Freq: Four times a day (QID) | INTRAMUSCULAR | Status: DC | PRN

## 2016-10-10 MED ORDER — QUETIAPINE FUMARATE 25 MG PO TABS
25.0000 mg | ORAL_TABLET | Freq: Two times a day (BID) | ORAL | Status: DC
  Administered 2016-10-10 – 2016-10-12 (×4): 25 mg via ORAL
  Filled 2016-10-10 (×8): qty 1

## 2016-10-10 MED ORDER — FUROSEMIDE 10 MG/ML IJ SOLN
40.0000 mg | Freq: Two times a day (BID) | INTRAMUSCULAR | Status: DC
  Administered 2016-10-10 – 2016-10-11 (×3): 40 mg via INTRAVENOUS
  Filled 2016-10-10 (×3): qty 4

## 2016-10-10 MED ORDER — SODIUM CHLORIDE 0.9% FLUSH
3.0000 mL | Freq: Two times a day (BID) | INTRAVENOUS | Status: DC
  Administered 2016-10-10 – 2016-10-12 (×3): 3 mL via INTRAVENOUS

## 2016-10-10 MED ORDER — SODIUM CHLORIDE 0.9% FLUSH
3.0000 mL | INTRAVENOUS | Status: DC | PRN
Start: 2016-10-10 — End: 2016-10-12
  Administered 2016-10-11: 3 mL via INTRAVENOUS
  Filled 2016-10-10: qty 3

## 2016-10-10 NOTE — ED Triage Notes (Signed)
Per EMS pt has hx of CHF, mitral valve repair, was at home and had sudden onset of SOB and chest pain.  Room air O2 85%, coughing up blood

## 2016-10-10 NOTE — ED Notes (Signed)
Report called to Fayrene FearingJames, RN ICU/SDU, all questions answered

## 2016-10-10 NOTE — Progress Notes (Signed)
  Echocardiogram 2D Echocardiogram has been performed.  Carlyn Lemke L Androw 10/10/2016, 10:29 AM

## 2016-10-10 NOTE — ED Notes (Signed)
CRITICAL VALUE ALERT  Critical value received:  k 2.7  Date of notification:  10/10/2016  Time of notification:  0410  Critical value read back:Yes.    Nurse who received alert:  Malena Catholicsamantha Austina Constantin  MD notified (1st page):  pollina  Time of first page:  [phone]   Time MD responded:  [phone]

## 2016-10-10 NOTE — Progress Notes (Signed)
Patient seen and examined, database reviewed, discussed with family members at bedside. Patient admitted earlier today due to acute onset of shortness of breath. She has a history of postpartum cardiomyopathy with a mitral valve repair and has now developed mitral stenosis. She last saw cardiology on January 24 and there was a mention of her needing valve repair in the near future. Echo this admission shows moderate to severe mitral stenosis. She initially required BiPAP, has been diuresed and weaned off of it. Continue diuresis, strive for negative fluid balance, will request cardiology consultation in a.m. Continue to follow.  Peggye PittEstela Hernandez, MD [facility] Pager: [phone]

## 2016-10-10 NOTE — ED Provider Notes (Signed)
[facility] Provider Note   CSN: [id number] Arrival date & time: 10/10/16  4098     History   Chief Complaint Chief Complaint  Patient presents with  . Chest Pain  . Respiratory Distress    HPI Theresa Crawford is a 36 y.o. female.  Patient presents to the ER for evaluation of her discomfort breath. Patient reports that she awakened from sleep with shortness of breath and tightness in her chest. She reports discomfort in the chest when she takes a deep breath. She has developed a cough and her sputum has been bloody. Patient comes to the ER by ambulance. Patient was found to have room air oxygen saturation of 85-86%. She is not on oxygen at home. She was placed on supplemental oxygen and has improved.  Patient does have a history of mitral valve regurg status post surgical repair. She is on Bumex. She reports that her weight has been steady, no noted weight gain.      Past Medical History:  Diagnosis Date  . CHF (congestive heart failure) (HCC)   . Congestive heart failure (HCC)   . History of open heart surgery   . Hypertension   . Mitral valve disease    mitral regurgitation    Patient Active Problem List   Diagnosis Date Noted  . S/P mitral valve repair   . CAP (community acquired pneumonia) 05/25/2016  . Leukocytosis 05/24/2016  . Acute respiratory failure with hypoxia (HCC) 05/23/2016  . Acute on chronic diastolic CHF (congestive heart failure) (HCC) 05/23/2016  . Cough with hemoptysis 05/23/2016  . Postpartum complication pericarditis in 2009 with eventual needing MVR in 2015 05/23/2016  . Anemia, iron deficiency 05/23/2016  . Hypertension   . SOB (shortness of breath)   . Respiratory distress 02/16/2016  . Essential hypertension 02/16/2016  . Congestive heart failure (HCC) 11/26/2015    Past Surgical History:  Procedure Laterality Date  . ABDOMINAL HYSTERECTOMY    . CESAREAN SECTION     X's 2  . MITRAL VALVE REPAIR    . open heart surgery    .  PARTIAL HYSTERECTOMY  2011   PID w/problem with fallopian tubes  . TEE WITHOUT CARDIOVERSION N/A 05/26/2016   Procedure: TRANSESOPHAGEAL ECHOCARDIOGRAM (TEE);  Surgeon: [name], MD;  Location: [facility];  Service: Cardiovascular;  Laterality: N/A;    OB History    Gravida Para Term Preterm AB Living   2 2 2          SAB TAB Ectopic Multiple Live Births                   Home Medications    Prior to Admission medications   Medication Sig Start Date End Date Taking? Authorizing Provider  bumetanide (BUMEX) 1 MG tablet Take 1 tablet (1 mg total) by mouth 2 (two) times daily. 07/06/16   Jodelle Gross, NP  citalopram (CELEXA) 40 MG tablet Take 40 mg by mouth 2 (two) times daily.     Historical Provider, MD  cyclobenzaprine (FLEXERIL) 5 MG tablet Take 1 tablet (5 mg total) by mouth 3 (three) times daily as needed for muscle spasms. 09/25/16   Burgess Amor, PA-C  diclofenac (VOLTAREN) 75 MG EC tablet Take 1 tablet (75 mg total) by mouth 2 (two) times daily. 09/25/16   Burgess Amor, PA-C  ferrous sulfate 325 (65 FE) MG tablet Take 325 mg by mouth 2 (two) times daily with a meal.    Historical Provider, MD  HYDROcodone-acetaminophen (NORCO/VICODIN) 5-325 MG tablet Take 1 tablet by mouth every 4 (four) hours as needed. 09/25/16   Burgess AmorJulie Idol, PA-C  losartan (COZAAR) 25 MG tablet Take 1 tablet (25 mg total) by mouth daily. 05/28/16   Jeralyn BennettEzequiel Zamora, MD  metoprolol succinate (TOPROL XL) 50 MG 24 hr tablet Take 1 tablet (50 mg total) by mouth daily. Take with or immediately following a meal. 10/06/16   Laqueta LindenSuresh A Koneswaran, MD  QUEtiapine (SEROQUEL) 25 MG tablet Take 25 mg by mouth 2 (two) times daily.    Historical Provider, MD  STOOL SOFTENER 100 MG capsule Take 100 mg by mouth daily. 10/29/15   Historical Provider, MD    Family History Family History  Problem Relation Age of Onset  . Heart failure Father     just received LVAD  . Heart disease Paternal Grandfather     stent  placement    Social History Social History  Substance Use Topics  . Smoking status: Current Some Day Smoker    Packs/day: 0.25    Types: Cigarettes    Start date: 11/03/1999  . Smokeless tobacco: Never Used  . Alcohol use No     Allergies   Lisinopril; Penicillins; Bee venom; Contrast media [iodinated diagnostic agents]; and Shellfish allergy   Review of Systems Review of Systems  Respiratory: Positive for shortness of breath.   Cardiovascular: Positive for chest pain.  All other systems reviewed and are negative.    Physical Exam Updated Vital Signs BP (!) 74/51   Pulse 94   Temp 98.9 F (37.2 C) (Oral)   Resp 19   Ht 5\' 4"  (1.626 m)   Wt 220 lb (99.8 kg)   SpO2 97%   BMI 37.76 kg/m   Physical Exam  Constitutional: She is oriented to person, place, and time. She appears well-developed and well-nourished. She appears distressed.  HENT:  Head: Normocephalic and atraumatic.  Right Ear: Hearing normal.  Left Ear: Hearing normal.  Nose: Nose normal.  Mouth/Throat: Oropharynx is clear and moist and mucous membranes are normal.  Eyes: Conjunctivae and EOM are normal. Pupils are equal, round, and reactive to light.  Neck: Normal range of motion. Neck supple.  Cardiovascular: Regular rhythm, S1 normal and S2 normal.  Exam reveals no gallop and no friction rub.   No murmur heard. Pulmonary/Chest: Accessory muscle usage present. Tachypnea noted. No respiratory distress. She has rales. She exhibits no tenderness.  Abdominal: Soft. Normal appearance and bowel sounds are normal. There is no hepatosplenomegaly. There is no tenderness. There is no rebound, no guarding, no tenderness at McBurney's point and negative Murphy's sign. No hernia.  Musculoskeletal: Normal range of motion. She exhibits edema (trace).  Neurological: She is alert and oriented to person, place, and time. She has normal strength. No cranial nerve deficit or sensory deficit. Coordination normal. GCS eye  subscore is 4. GCS verbal subscore is 5. GCS motor subscore is 6.  Skin: Skin is warm, dry and intact. No rash noted. No cyanosis.  Psychiatric: She has a normal mood and affect. Her speech is normal and behavior is normal. Thought content normal.  Nursing note and vitals reviewed.    ED Treatments / Results  Labs (all labs ordered are listed, but only abnormal results are displayed) Labs Reviewed  CBC WITH DIFFERENTIAL/PLATELET - Abnormal; Notable for the following:       Result Value   WBC 18.0 (*)    Hemoglobin 11.8 (*)    HCT 35.5 (*)  Neutro Abs 15.1 (*)    All other components within normal limits  COMPREHENSIVE METABOLIC PANEL - Abnormal; Notable for the following:    Potassium 2.7 (*)    Glucose, Bld 121 (*)    All other components within normal limits  BRAIN NATRIURETIC PEPTIDE - Abnormal; Notable for the following:    B Natriuretic Peptide 257.0 (*)    All other components within normal limits  D-DIMER, QUANTITATIVE (NOT AT [facility]) - Abnormal; Notable for the following:    D-Dimer, Quant 2.93 (*)    All other components within normal limits  TROPONIN I    EKG  EKG Interpretation  Date/Time:  Sunday October 10 2016 03:37:43 EST Ventricular Rate:  95 PR Interval:    QRS Duration: 86 QT Interval:  366 QTC Calculation: 461 R Axis:   21 Text Interpretation:  Sinus rhythm Left atrial enlargement No significant change since last tracing Confirmed by Azaryah Heathcock  MD, Tashi Band [phone]) on 10/10/2016 3:39:59 AM       Radiology Dg Chest Port 1 View  Result Date: 10/10/2016 CLINICAL DATA:  CHF with mitral valve repair with onset of dyspnea and chest pain. EXAM: PORTABLE CHEST 1 VIEW COMPARISON:  None. FINDINGS: Status post mitral valve repair with median sternotomy sutures noted. Diffuse airspace opacities are seen bilaterally consistent with CHF. Superimposed pneumonia would be difficult to entirely exclude especially at the right lung base. Probable small right effusion  no aortic aneurysm. No suspicious osseous abnormalities. IMPRESSION: Borderline cardiomegaly with diffuse airspace opacities consistent with CHF. Superimposed pneumonia would be difficult to entirely exclude. Electronically Signed   By: Tollie Eth M.D.   On: 10/10/2016 04:17    Procedures Procedures (including critical care time)  Medications Ordered in ED Medications  potassium chloride SA (K-DUR,KLOR-CON) CR tablet 40 mEq (not administered)  furosemide (LASIX) injection 40 mg (40 mg Intravenous Given 10/10/16 0426)     Initial Impression / Assessment and Plan / ED Course  I have reviewed the triage vital signs and the nursing notes.  Pertinent labs & imaging results that were available during my care of the patient were reviewed by me and considered in my medical decision making (see chart for details).    Patient brought to the emergency department from home for evaluation of sudden onset of shortness of breath with chest comfort. Patient has history of congestive heart failure and mitral valve repair. She reports that she has been taking her Bumex, no recent dosing changes. Her weight has been stable. Patient awakened very short of breath. She did have mild respiratory distress and diffuse rales on examination. She also, however, has been coughing and sputum is bloody. D-dimer is elevated. She has a dye allergy, however, cannot have CT angiography performed.  Chest x-ray is consistent with pulmonary edema. This is most likely cause of her dyspnea. Patient administered IV Lasix and placed on BiPAP for respiratory support.  Patient has a potassium of 2.7, given oral potassium. Blood pressures have been low. It appears that her blood pressure is normally in the mid 90s systolic range at outpatient visits. She has been in the 70s and 80s at times here today.  Treatment plan discussed with Dr. Tiburcio Pea, on-call for cardiology. As patient was mentating well, appeared to be perfusing well,  recommended tolerating lower blood pressures. Her blood pressure has, however, actually improved after addition of BiPAP. She is now 100 systolic and feeling better. Will need to get echo to evaluate the mitral valve. I do not  hear any obvious murmurs to suggest failure of the valve.  CRITICAL CARE Performed by: Gilda Crease   Total critical care time: 35 minutes  Critical care time was exclusive of separately billable procedures and treating other patients.  Critical care was necessary to treat or prevent imminent or life-threatening deterioration.  Critical care was time spent personally by me on the following activities: development of treatment plan with patient and/or surrogate as well as nursing, discussions with consultants, evaluation of patient's response to treatment, examination of patient, obtaining history from patient or surrogate, ordering and performing treatments and interventions, ordering and review of laboratory studies, ordering and review of radiographic studies, pulse oximetry and re-evaluation of patient's condition.   Final Clinical Impressions(s) / ED Diagnoses   Final diagnoses:  Acute pulmonary edema [facility])    New Prescriptions New Prescriptions   No medications on file     Gilda Crease, MD 10/10/16 [phone]

## 2016-10-10 NOTE — H&P (Signed)
History and Physical    ELOINA ERGLE Crawford:[id number] DOB: Dec 05, 1980 DOA: 10/10/2016  PCP: Jerrell Belfast, [name]  Patient coming from: home  Chief Complaint:  sob  HPI: Theresa Crawford is a 36 y.o. female with medical history significant of peripartum heart failure/pericarditis subsequently needing mitral valve repair in 2015 done in [place] comes in with sudden onset of waking up from sleep with severe sob.  Pt saw dr Purvis Sheffield 4 days ago at which time her metoprolol was doubled in dose and she reports she was told she would "at some point" need her valve repaired again however this is not documented in his note from 4 days ago.  She was hospitalized in 9/17 with similar symptoms but not this severe and had a TTE and TEE which showed mild to moderated mitral regurgitation at that time.  Pt has been in her normal state of health up until waking up tonight with difficulting breathing.  She did have a lot of coughing at this time and some of it was bloody.  Denies pain.  Since arrival to the ED she has been placed on bipap as her oxygen sats were below 80%.   She is feeling much better feels she is breathing better with the bipap.  Pt referred for admission for acute chf exacerbation.  She has had no fevers.  She reports her bp normally runs low at home.  Again no recent illnesses. No confusion.  Review of Systems: As per HPI otherwise 10 point review of systems negative.   Past Medical History:  Diagnosis Date  . CHF (congestive heart failure) (HCC)   . Congestive heart failure (HCC)   . History of open heart surgery   . Hypertension   . Mitral valve disease    mitral regurgitation    Past Surgical History:  Procedure Laterality Date  . ABDOMINAL HYSTERECTOMY    . CESAREAN SECTION     X's 2  . MITRAL VALVE REPAIR    . open heart surgery    . PARTIAL HYSTERECTOMY  2011   PID w/problem with fallopian tubes  . TEE WITHOUT CARDIOVERSION N/A 05/26/2016   Procedure: TRANSESOPHAGEAL  ECHOCARDIOGRAM (TEE);  Surgeon: [name], MD;  [facility];  Service: Cardiovascular;  Laterality: N/A;     reports that she has been smoking Cigarettes.  She started smoking about 16 years ago. She has been smoking about 0.25 packs per day. She has never used smokeless tobacco. She reports that she does not drink alcohol or use drugs.  Allergies  Allergen Reactions  . Lisinopril Shortness Of Breath and Swelling    Throat swelling  . Penicillins Shortness Of Breath and Swelling    Has patient had a PCN reaction causing immediate rash, facial/tongue/throat swelling, SOB or lightheadedness with hypotension: Yes Has patient had a PCN reaction causing severe rash involving mucus membranes or skin necrosis: Yes Has patient had a PCN reaction that required hospitalization No Has patient had a PCN reaction occurring within the last 10 years: No If all of the above answers are "NO", then may proceed with Cephalosporin use.   . Bee Venom   . Contrast Media [Iodinated Diagnostic Agents]     difinity  . Shellfish Allergy     Family History  Problem Relation Age of Onset  . Heart failure Father     just received LVAD  . Heart disease Paternal Grandfather     stent placement    Prior to Admission  medications   Medication Sig Start Date End Date Taking? Authorizing Provider  bumetanide (BUMEX) 1 MG tablet Take 1 tablet (1 mg total) by mouth 2 (two) times daily. 07/06/16   Jodelle Gross, NP  citalopram (CELEXA) 40 MG tablet Take 40 mg by mouth 2 (two) times daily.     Historical Provider, MD  cyclobenzaprine (FLEXERIL) 5 MG tablet Take 1 tablet (5 mg total) by mouth 3 (three) times daily as needed for muscle spasms. 09/25/16   Burgess Amor, PA-C  diclofenac (VOLTAREN) 75 MG EC tablet Take 1 tablet (75 mg total) by mouth 2 (two) times daily. 09/25/16   Burgess Amor, PA-C  ferrous sulfate 325 (65 FE) MG tablet Take 325 mg by mouth 2 (two) times daily with a meal.     Historical Provider, MD  HYDROcodone-acetaminophen (NORCO/VICODIN) 5-325 MG tablet Take 1 tablet by mouth every 4 (four) hours as needed. 09/25/16   Burgess Amor, PA-C  losartan (COZAAR) 25 MG tablet Take 1 tablet (25 mg total) by mouth daily. 05/28/16   Jeralyn Bennett, MD  metoprolol succinate (TOPROL XL) 50 MG 24 hr tablet Take 1 tablet (50 mg total) by mouth daily. Take with or immediately following a meal. 10/06/16   [name], MD  QUEtiapine (SEROQUEL) 25 MG tablet Take 25 mg by mouth 2 (two) times daily.    Historical Provider, MD  STOOL SOFTENER 100 MG capsule Take 100 mg by mouth daily. 10/29/15   Historical Provider, MD    Physical Exam: Vitals:   10/10/16 0332 10/10/16 0400 10/10/16 0434 10/10/16 0500  BP: 99/71 (!) 74/51  (!) 84/53  Pulse: 106 94 94   Resp: (!) 29 (!) 36 19 22  Temp:      TempSrc:      SpO2: 95% 100% 97%   Weight:      Height:       Constitutional: NAD, calm, comfortable on bipap with normal mentation able to answer questions in full sentences Vitals:   10/10/16 0332 10/10/16 0400 10/10/16 0434 10/10/16 0500  BP: 99/71 (!) 74/51  (!) 84/53  Pulse: 106 94 94   Resp: (!) 29 (!) 36 19 22  Temp:      TempSrc:      SpO2: 95% 100% 97%   Weight:      Height:       Eyes: PERRL, lids and conjunctivae normal ENMT: Mucous membranes are moist. Posterior pharynx clear of any exudate or lesions.Normal dentition.  Neck: normal, supple, no masses, no thyromegaly Respiratory: diffuse rales bilaterally, no wheezing, no crackles. Normal respiratory effort. No accessory muscle use.  On bipap Cardiovascular: Regular rate and rhythm, no murmurs / rubs / gallops. No extremity edema. 2+ pedal pulses. No carotid bruits.  Abdomen: no tenderness, no masses palpated. No hepatosplenomegaly. Bowel sounds positive.  Musculoskeletal: no clubbing / cyanosis. No joint deformity upper and lower extremities. Good ROM, no contractures. Normal muscle tone.  Skin: no rashes, lesions,  ulcers. No induration Neurologic: CN 2-12 grossly intact. Sensation intact, DTR normal. Strength 5/5 in all 4.  Psychiatric: Normal judgment and insight. Alert and oriented x 3. Normal mood.    Labs on Admission: I have personally reviewed following labs and imaging studies  CBC:  Recent Labs Lab 10/10/16 0329  WBC 18.0*  NEUTROABS 15.1*  HGB 11.8*  HCT 35.5*  MCV 80.9  PLT 325   Basic Metabolic Panel:  Recent Labs Lab 10/10/16 0329  NA 139  K 2.7*  CL 101  CO2 30  GLUCOSE 121*  BUN 15  CREATININE 0.79  CALCIUM 9.2   GFR: Estimated Creatinine Clearance: 111.6 mL/min (by C-G formula based on SCr of 0.79 mg/dL). Liver Function Tests:  Recent Labs Lab 10/10/16 0329  AST 21  ALT 14  ALKPHOS 57  BILITOT 0.9  PROT 7.5  ALBUMIN 3.9    Cardiac Enzymes:  Recent Labs Lab 10/10/16 0329  TROPONINI <0.03   Radiological Exams on Admission: Dg Chest Port 1 View  Result Date: 10/10/2016 CLINICAL DATA:  CHF with mitral valve repair with onset of dyspnea and chest pain. EXAM: PORTABLE CHEST 1 VIEW COMPARISON:  None. FINDINGS: Status post mitral valve repair with median sternotomy sutures noted. Diffuse airspace opacities are seen bilaterally consistent with CHF. Superimposed pneumonia would be difficult to entirely exclude especially at the right lung base. Probable small right effusion no aortic aneurysm. No suspicious osseous abnormalities. IMPRESSION: Borderline cardiomegaly with diffuse airspace opacities consistent with CHF. Superimposed pneumonia would be difficult to entirely exclude. Electronically Signed   By: Tollie Eth M.D.   On: 10/10/2016 04:17    EKG: Independently reviewed. nsr no acute issues cxr diffuse edema bilaterally, cannot r/o infiltrate Old chart reviewed Case discussed with dr Blinda Leatherwood in ED   Assessment/Plan 36 yo female s/p MVR in 2015, diastolic CHF comes in with acute hypoxic respiratory failure secondary to acute chf exac/?flash pulm  edema  Principal Problem:   Acute on chronic diastolic CHF (congestive heart failure) (HCC)- I hear no murmurs, TEE in sept does not show significant issues with her mitral valve noted to be mild to moderate regurg, unclear why or if she was told 4 days ago her valve would have to have surgery again "at some point", she has not been set up to see CT surgery yet.  Her bblocker was increased 4 days ago, will hold this at this time due to soft bp in the setting of needing more diuresis.  Place on lasix 40mg  iv q 12 hours if her bp allows, would increase also if bp allows.  Ordered stat echo to be done today (sunday).  Continue on bipap and wean as tolerates.  She is urinating now, first since given iv lasix in ED.  She clinically feels better with bipap support, is mentating normally.  Hopefully will improve with iv diuretics.    I have had a long discussion with her about transferring to [place] as [place] has cardiology nor CT surgery available.  She understands this but wants to hold off on going to [facility] until her studies are done here.  If this indeed shows worsening valve issue, she will go to [place].  I have also told her if she clinically deteriorates in any way, she will be transferred to [place].  She has a 73 year old daughter who she has to make arrangements for ,this is her concern for not leaving town at this time.  Cardiologist on call at [facility] was called by dr Blinda Leatherwood who advised that patient did not need to be transferred to [facility] at this time.    Active Problems:   Acute respiratory failure with hypoxia (HCC)- as above, will also do stat vq scan to r/o PE due to hemoptysis and ddimer of over 3   Postpartum complication pericarditis in 2009 with eventual needing MVR in 2015-noted   SOB (shortness of breath)- due to pulmonary edema   S/P mitral valve repair- noted, repeat echo today  DVT prophylaxis:  scds  Code Status:  full Family Communication: mother and significant  other at bedside  Disposition Plan:  Per day team Consults called:  none Admission status:  admission   Darbi Chandran A MD [facility]  If 7PM-7AM, please contact night-coverage [email or web address] Password [email or web address]  10/10/2016, 5:33 AM

## 2016-10-11 DIAGNOSIS — I10 Essential (primary) hypertension: Secondary | ICD-10-CM

## 2016-10-11 DIAGNOSIS — I5033 Acute on chronic diastolic (congestive) heart failure: Secondary | ICD-10-CM

## 2016-10-11 DIAGNOSIS — I050 Rheumatic mitral stenosis: Secondary | ICD-10-CM

## 2016-10-11 DIAGNOSIS — R000 Tachycardia, unspecified: Secondary | ICD-10-CM

## 2016-10-11 DIAGNOSIS — Z9889 Other specified postprocedural states: Secondary | ICD-10-CM

## 2016-10-11 DIAGNOSIS — J9601 Acute respiratory failure with hypoxia: Secondary | ICD-10-CM

## 2016-10-11 LAB — BASIC METABOLIC PANEL
Anion gap: 8 (ref 5–15)
BUN: 14 mg/dL (ref 6–20)
CHLORIDE: 103 mmol/L (ref 101–111)
CO2: 25 mmol/L (ref 22–32)
Calcium: 8.9 mg/dL (ref 8.9–10.3)
Creatinine, Ser: 0.72 mg/dL (ref 0.44–1.00)
GFR calc non Af Amer: >60 mL/min (ref >60)
Glucose, Bld: 115 mg/dL — ABNORMAL HIGH (ref 65–99)
POTASSIUM: 4.1 mmol/L (ref 3.5–5.1)
Sodium: 136 mmol/L (ref 135–145)

## 2016-10-11 LAB — CBC
HEMATOCRIT: 33.9 % — AB (ref 36.0–46.0)
Hemoglobin: 11.1 g/dL — ABNORMAL LOW (ref 12.0–15.0)
MCH: 26.4 pg (ref 26.0–34.0)
MCHC: 32.7 g/dL (ref 30.0–36.0)
MCV: 80.5 fL (ref 78.0–100.0)
PLATELETS: 326 10*3/uL (ref 150–400)
RBC: 4.21 MIL/uL (ref 3.87–5.11)
RDW: 15.4 % (ref 11.5–15.5)
WBC: 13.3 10*3/uL — ABNORMAL HIGH (ref 4.0–10.5)

## 2016-10-11 MED ORDER — FUROSEMIDE 40 MG PO TABS
40.0000 mg | ORAL_TABLET | Freq: Two times a day (BID) | ORAL | Status: DC
  Administered 2016-10-11 – 2016-10-12 (×2): 40 mg via ORAL
  Filled 2016-10-11 (×2): qty 1

## 2016-10-11 NOTE — Consult Note (Signed)
CARDIOLOGY CONSULT NOTE  Patient ID: Theresa Crawford MRN: [id number] DOB/AGE: 1980/11/19 36 y.o.  Admit date: 10/10/2016 Primary Physician: Jerrell Belfast, [name] Referring Physician: Ardyth Harps MD Primary cardiologist: Purvis Sheffield  Reason for Consultation: CHF, mitral stenosis  HPI: 36 yr old woman with chronic diastolic heart failure and mitral valve stenosis with h/o repair whom I saw in clinic on 1/24, at which time she was doing well. She presented to the ED yesterday with the acute development of cough and shortness of breath which awoke her from sleep and was found to be in acute on chronic diastolic heart failure. She was started on IV Lasix 40 mg bid and antihypertensive therapy was held due to hypotension.  She denies eating any salty foods. Denies any upper respiratory symptoms in the days leading up to this event.  370 cc output recorded thus far.  BNP 257. Troponins normal.   CXR showed CHF.  TTE yesterday showed normal LV systolic function, EF 55-60% with moderate to severe mitral stenosis, mean gradient 12 mmHg.   TEE 05/26/16: Normal left ventricular systolic function and regional wall motion, LVEF 55-60%, mild to moderate mitral valve stenosis with mild regurgitation, mean gradient 5 mmHg, moderate left atrial dilatation, mildly reduced right ventricular systolic function, mild right atrial dilatation, mild tricuspid regurgitation, PASP 32 mmHg.  Says she is feeling better now but says she has not urinated much. She just received another dose of IV Lasix.  Allergies  Allergen Reactions  . Lisinopril Shortness Of Breath and Swelling    Throat swelling  . Penicillins Shortness Of Breath and Swelling    Has patient had a PCN reaction causing immediate rash, facial/tongue/throat swelling, SOB or lightheadedness with hypotension: Yes Has patient had a PCN reaction causing severe rash involving mucus membranes or skin necrosis: Yes Has patient had a PCN reaction that  required hospitalization No Has patient had a PCN reaction occurring within the last 10 years: No If all of the above answers are "NO", then may proceed with Cephalosporin use.   . Bee Venom   . Contrast Media [Iodinated Diagnostic Agents]     difinity  . Shellfish Allergy     Current Facility-Administered Medications  Medication Dose Route Frequency Provider Last Rate Last Dose  . 0.9 %  sodium chloride infusion  250 mL Intravenous PRN Haydee Monica, MD      . acetaminophen (TYLENOL) tablet 650 mg  650 mg Oral Q4H PRN Haydee Monica, MD   650 mg at 10/10/16 1522  . citalopram (CELEXA) tablet 40 mg  40 mg Oral BID Henderson Cloud, MD   40 mg at 10/11/16 1021  . furosemide (LASIX) injection 40 mg  40 mg Intravenous Q12H Haydee Monica, MD   40 mg at 10/11/16 0834  . ondansetron (ZOFRAN) injection 4 mg  4 mg Intravenous Q6H PRN Haydee Monica, MD      . QUEtiapine (SEROQUEL) tablet 25 mg  25 mg Oral BID Henderson Cloud, MD   25 mg at 10/11/16 1021  . sodium chloride flush (NS) 0.9 % injection 3 mL  3 mL Intravenous Q12H Haydee Monica, MD   3 mL at 10/10/16 2133  . sodium chloride flush (NS) 0.9 % injection 3 mL  3 mL Intravenous PRN Haydee Monica, MD   3 mL at 10/11/16 1023    Past Medical History:  Diagnosis Date  . CHF (congestive heart failure) (HCC)   .  Congestive heart failure (HCC)   . History of open heart surgery   . Hypertension   . Mitral valve disease    mitral regurgitation    Past Surgical History:  Procedure Laterality Date  . ABDOMINAL HYSTERECTOMY    . CESAREAN SECTION     X's 2  . MITRAL VALVE REPAIR    . open heart surgery    . PARTIAL HYSTERECTOMY  2011   PID w/problem with fallopian tubes  . TEE WITHOUT CARDIOVERSION N/A 05/26/2016   Procedure: TRANSESOPHAGEAL ECHOCARDIOGRAM (TEE);  Surgeon: [name], MD;  Location: [facility];  Service: Cardiovascular;  Laterality: N/A;    Social History   Social History  . Marital  status: Unknown    Spouse name: N/A  . Number of children: N/A  . Years of education: N/A   Occupational History  . Not on file.   Social History Main Topics  . Smoking status: Current Some Day Smoker    Packs/day: 0.25    Types: Cigarettes    Start date: 11/03/1999  . Smokeless tobacco: Never Used  . Alcohol use No  . Drug use: No  . Sexual activity: Not on file   Other Topics Concern  . Not on file   Social History Narrative  . No narrative on file     No family history of premature CAD in 1st degree relatives.  Prior to Admission medications   Medication Sig Start Date End Date Taking? Authorizing Provider  bumetanide (BUMEX) 1 MG tablet Take 1 tablet (1 mg total) by mouth 2 (two) times daily. 07/06/16  Yes Jodelle Gross, NP  citalopram (CELEXA) 40 MG tablet Take 40 mg by mouth 2 (two) times daily.    Yes Historical Provider, MD  ferrous sulfate 325 (65 FE) MG tablet Take 325 mg by mouth 2 (two) times daily with a meal.   Yes Historical Provider, MD  losartan (COZAAR) 25 MG tablet Take 1 tablet (25 mg total) by mouth daily. 05/28/16  Yes Jeralyn Bennett, MD  metoprolol succinate (TOPROL XL) 50 MG 24 hr tablet Take 1 tablet (50 mg total) by mouth daily. Take with or immediately following a meal. 10/06/16  Yes [name], MD  QUEtiapine (SEROQUEL) 25 MG tablet Take 25 mg by mouth 2 (two) times daily.   Yes Historical Provider, MD  STOOL SOFTENER 100 MG capsule Take 100 mg by mouth daily. 10/29/15  Yes Historical Provider, MD  cyclobenzaprine (FLEXERIL) 5 MG tablet Take 1 tablet (5 mg total) by mouth 3 (three) times daily as needed for muscle spasms. Patient not taking: Reported on 10/10/2016 09/25/16   Burgess Amor, PA-C  diclofenac (VOLTAREN) 75 MG EC tablet Take 1 tablet (75 mg total) by mouth 2 (two) times daily. Patient not taking: Reported on 10/10/2016 09/25/16   Burgess Amor, PA-C  HYDROcodone-acetaminophen (NORCO/VICODIN) 5-325 MG tablet Take 1 tablet by mouth  every 4 (four) hours as needed. Patient not taking: Reported on 10/10/2016 09/25/16   Burgess Amor, PA-C     Review of systems complete and found to be negative unless listed above in HPI     Physical exam Blood pressure (!) 102/55, pulse 77, temperature 98.6 F (37 C), temperature source Oral, resp. rate 18, height 5\' 4"  (1.626 m), weight 227 lb 11.8 oz (103.3 kg), SpO2 97 %. General: NAD Neck: No JVD, no thyromegaly or thyroid nodule.  Lungs: Clear to auscultation bilaterally with normal respiratory effort. CV: Nondisplaced PMI. Regular rate and  rhythm, normal S1/S2, no S3/S4, no murmur.  No peripheral edema.  Normal pedal pulses.  Abdomen: Soft, nontender, no hepatosplenomegaly, no distention.  Skin: Intact without lesions or rashes.  Neurologic: Alert and oriented x 3.  Psych: Normal affect. Extremities: No clubbing or cyanosis.  HEENT: Normal.   ECG: Most recent ECG reviewed.  Labs:   Lab Results  Component Value Date   WBC 13.3 (H) 10/11/2016   HGB 11.1 (L) 10/11/2016   HCT 33.9 (L) 10/11/2016   MCV 80.5 10/11/2016   PLT 326 10/11/2016    Recent Labs Lab 10/10/16 0329 10/11/16 0452  NA 139 136  K 2.7* 4.1  CL 101 103  CO2 30 25  BUN 15 14  CREATININE 0.79 0.72  CALCIUM 9.2 8.9  PROT 7.5  --   BILITOT 0.9  --   ALKPHOS 57  --   ALT 14  --   AST 21  --   GLUCOSE 121* 115*   Lab Results  Component Value Date   TROPONINI <0.03 10/10/2016   No results found for: CHOL No results found for: HDL No results found for: LDLCALC No results found for: TRIG No results found for: CHOLHDL No results found for: LDLDIRECT       Studies: Dg Chest Port 1 View  Result Date: 10/10/2016 CLINICAL DATA:  CHF with mitral valve repair with onset of dyspnea and chest pain. EXAM: PORTABLE CHEST 1 VIEW COMPARISON:  None. FINDINGS: Status post mitral valve repair with median sternotomy sutures noted. Diffuse airspace opacities are seen bilaterally consistent with CHF.  Superimposed pneumonia would be difficult to entirely exclude especially at the right lung base. Probable small right effusion no aortic aneurysm. No suspicious osseous abnormalities. IMPRESSION: Borderline cardiomegaly with diffuse airspace opacities consistent with CHF. Superimposed pneumonia would be difficult to entirely exclude. Electronically Signed   By: Tollie Ethavid  Kwon M.D.   On: 10/10/2016 04:17    ASSESSMENT AND PLAN:   1. Acute on chronic diastolic heart failure: On Bumex 2 mg twice daily as outpatient. I recently stopped spironolactone to allow for increase in Toprol-XL dose. I will stop IV Lasix (she did receive a dose this morning) and will start Lasix orally 40 mg bid. I will review echo images.  2. Mitral valve stenosis with h/o repair: Mild to moderate in 05/2016 by TEE, now reported as moderate to severe by TTE with mean gradient 12 mmHg. It would be unusual for mitral stenosis to progress this rapidly and the TEE images would be the most accurate. I will review TTE images myself.  3. Hypertension: Low normal. Continue to hold metoprolol and ARB.  4. Tachycardia: Being diuresed. Due to low normal BP, hold metoprolol.  Signed: Prentice DockerSuresh Marguerite Barba, M.D., F.A.C.C.  10/11/2016, 10:37 AM

## 2016-10-11 NOTE — Progress Notes (Signed)
PROGRESS NOTE    Theresa Crawford  ZOX:[id number] DOB: March 22, 1981 DOA: 10/10/2016 PCP: Jerrell BelfastHouse, [name], [name]     Brief Narrative:  36 year old woman admitted from home on 1/28 with complaints of shortness of breath. She has a history of mitral stenosis which according to 2-D echo has progressed from moderate to severe, she was found to be in florid pulmonary edema and required BiPAP transiently.   Assessment & Plan:   Principal Problem:   Acute on chronic diastolic CHF (congestive heart failure) (HCC) Active Problems:   Acute respiratory failure with hypoxia (HCC)   Postpartum complication pericarditis in 2009 with eventual needing MVR in 2015   SOB (shortness of breath)   S/P mitral valve repair   Acute on chronic diastolic CHF -Due to severe mitral valve stenosis. -I do not believe recorded intake and output is accurate as there is only 370 mL documented as negative output desspite her marked clinical improvement. -Plan to transition to oral Lasix today, cardiology is planning on possibly doing a TEE as an outpatient to further assess progression of mitral valve disease. -If does well on oral Lasix, plan to discharge home in a.m.   DVT prophylaxis: SCDs Code Status: Full code Family Communication: Patient only Disposition Plan: Transfer to floor, hope for discharge home in 24 hours  Consultants:   Cardiology  Procedures:   2-D echo: Ejection fraction of 55-60%, not technically sufficient to evaluate LV diastolic function due to mitral valve repair. Moderate to severe mitral stenosis with a valvular gradient of 12.  Antimicrobials:  Anti-infectives    None       Subjective: None  Objective: Vitals:   10/11/16 1200 10/11/16 1300 10/11/16 1400 10/11/16 1500  BP: 95/66 109/72 (!) 107/55 118/66  Pulse: 84 87 85 86  Resp: (!) 24 (!) 24 (!) 30 (!) 27  Temp:      TempSrc:      SpO2: 96% 95% 96% 95%  Weight:      Height:        Intake/Output Summary (Last 24  hours) at 10/11/16 1713 Last data filed at 10/11/16 1309  Gross per 24 hour  Intake              560 ml  Output              700 ml  Net             -140 ml   Filed Weights   10/10/16 0637 10/10/16 0638 10/11/16 0500  Weight: 103.3 kg (227 lb 11.8 oz) 103.3 kg (227 lb 11.8 oz) 103.3 kg (227 lb 11.8 oz)    Examination:  General exam: Alert, awake, oriented x 3 Respiratory system: Clear to auscultation. Respiratory effort normal. Cardiovascular system:RRR. No murmurs, rubs, gallops. Gastrointestinal system: Abdomen is nondistended, soft and nontender. No organomegaly or masses felt. Normal bowel sounds heard. Central nervous system: Alert and oriented. No focal neurological deficits. Extremities: No C/C/E, +pedal pulses Skin: No rashes, lesions or ulcers Psychiatry: Judgement and insight appear normal. Mood & affect appropriate.     Data Reviewed: I have personally reviewed following labs and imaging studies  CBC:  Recent Labs Lab 10/10/16 0329 10/11/16 0452  WBC 18.0* 13.3*  NEUTROABS 15.1*  --   HGB 11.8* 11.1*  HCT 35.5* 33.9*  MCV 80.9 80.5  PLT 325 326   Basic Metabolic Panel:  Recent Labs Lab 10/10/16 0329 10/10/16 1330 10/11/16 0452  NA 139  --  136  K 2.7*  --  4.1  CL 101  --  103  CO2 30  --  25  GLUCOSE 121*  --  115*  BUN 15  --  14  CREATININE 0.79  --  0.72  CALCIUM 9.2  --  8.9  MG  --  1.7  --    GFR: Estimated Creatinine Clearance: 113.7 mL/min (by C-G formula based on SCr of 0.72 mg/dL). Liver Function Tests:  Recent Labs Lab 10/10/16 0329  AST 21  ALT 14  ALKPHOS 57  BILITOT 0.9  PROT 7.5  ALBUMIN 3.9   No results for input(s): LIPASE, AMYLASE in the last 168 hours. No results for input(s): AMMONIA in the last 168 hours. Coagulation Profile: No results for input(s): INR, PROTIME in the last 168 hours. Cardiac Enzymes:  Recent Labs Lab 10/10/16 0329 10/10/16 0723 10/10/16 1330 10/10/16 1838  TROPONINI <0.03 <0.03 <0.03  <0.03   BNP (last 3 results) No results for input(s): PROBNP in the last 8760 hours. HbA1C: No results for input(s): HGBA1C in the last 72 hours. CBG: No results for input(s): GLUCAP in the last 168 hours. Lipid Profile: No results for input(s): CHOL, HDL, LDLCALC, TRIG, CHOLHDL, LDLDIRECT in the last 72 hours. Thyroid Function Tests: No results for input(s): TSH, T4TOTAL, FREET4, T3FREE, THYROIDAB in the last 72 hours. Anemia Panel: No results for input(s): VITAMINB12, FOLATE, FERRITIN, TIBC, IRON, RETICCTPCT in the last 72 hours. Urine analysis:    Component Value Date/Time   COLORURINE YELLOW 05/23/2016 2027   APPEARANCEUR CLEAR 05/23/2016 2027   LABSPEC <1.005 (L) 05/23/2016 2027   PHURINE 6.5 05/23/2016 2027   GLUCOSEU NEGATIVE 05/23/2016 2027   HGBUR NEGATIVE 05/23/2016 2027   BILIRUBINUR NEGATIVE 05/23/2016 2027   KETONESUR NEGATIVE 05/23/2016 2027   PROTEINUR NEGATIVE 05/23/2016 2027   NITRITE NEGATIVE 05/23/2016 2027   LEUKOCYTESUR TRACE (A) 05/23/2016 2027   Sepsis Labs: @LABRCNTIP (procalcitonin:4,lacticidven:4)  ) Recent Results (from the past 240 hour(s))  MRSA PCR Screening     Status: None   Collection Time: 10/10/16  5:45 AM  Result Value Ref Range Status   MRSA by PCR NEGATIVE NEGATIVE Final    Comment:        The GeneXpert MRSA Assay (FDA approved for NASAL specimens only), is one component of a comprehensive MRSA colonization surveillance program. It is not intended to diagnose MRSA infection nor to guide or monitor treatment for MRSA infections.          Radiology Studies: Dg Chest Port 1 View  Result Date: 10/10/2016 CLINICAL DATA:  CHF with mitral valve repair with onset of dyspnea and chest pain. EXAM: PORTABLE CHEST 1 VIEW COMPARISON:  None. FINDINGS: Status post mitral valve repair with median sternotomy sutures noted. Diffuse airspace opacities are seen bilaterally consistent with CHF. Superimposed pneumonia would be difficult to  entirely exclude especially at the right lung base. Probable small right effusion no aortic aneurysm. No suspicious osseous abnormalities. IMPRESSION: Borderline cardiomegaly with diffuse airspace opacities consistent with CHF. Superimposed pneumonia would be difficult to entirely exclude. Electronically Signed   By: Tollie Eth M.D.   On: 10/10/2016 04:17        Scheduled Meds: . citalopram  40 mg Oral BID  . furosemide  40 mg Oral BID  . QUEtiapine  25 mg Oral BID  . sodium chloride flush  3 mL Intravenous Q12H   Continuous Infusions:   LOS: 1 day    Time spent: 25 minutes. Greater than 50% of  this time was spent in direct contact with the patient coordinating care.     Chaya Jan, MD [facility] Pager [phone]  If 7PM-7AM, please contact night-coverage [email or web address] Password [email or web address] 10/11/2016, 5:13 PM

## 2016-10-12 DIAGNOSIS — R0602 Shortness of breath: Secondary | ICD-10-CM

## 2016-10-12 LAB — BASIC METABOLIC PANEL
Anion gap: 7 (ref 5–15)
BUN: 14 mg/dL (ref 6–20)
CO2: 25 mmol/L (ref 22–32)
CREATININE: 0.72 mg/dL (ref 0.44–1.00)
Calcium: 8.4 mg/dL — ABNORMAL LOW (ref 8.9–10.3)
Chloride: 104 mmol/L (ref 101–111)
GFR calc non Af Amer: >60 mL/min (ref >60)
GLUCOSE: 101 mg/dL — AB (ref 65–99)
Potassium: 3.5 mmol/L (ref 3.5–5.1)
Sodium: 136 mmol/L (ref 135–145)

## 2016-10-12 MED ORDER — FUROSEMIDE 40 MG PO TABS: 40.0000 mg | ORAL_TABLET | Freq: Two times a day (BID) | ORAL | 2 refills | Status: DC

## 2016-10-12 MED ORDER — METOPROLOL SUCCINATE ER 25 MG PO TB24
25.0000 mg | ORAL_TABLET | Freq: Every day | ORAL | Status: DC
  Administered 2016-10-12: 25 mg via ORAL
  Filled 2016-10-12: qty 1

## 2016-10-12 MED ORDER — POTASSIUM CHLORIDE CRYS ER 20 MEQ PO TBCR
20.0000 meq | EXTENDED_RELEASE_TABLET | Freq: Every day | ORAL | Status: DC
  Administered 2016-10-12: 20 meq via ORAL
  Filled 2016-10-12: qty 1

## 2016-10-12 MED ORDER — POTASSIUM CHLORIDE CRYS ER 20 MEQ PO TBCR: 20.0000 meq | EXTENDED_RELEASE_TABLET | Freq: Every day | ORAL | 2 refills | Status: DC

## 2016-10-12 NOTE — Care Management Note (Signed)
Case Management Note  Patient Details  Name: Theresa Crawford MRN: [id number] Date of Birth: July 09, 1981  Subjective/Objective:                  Pt admitted for CHF. She is from home, lives with family. She is ind with ADL's. She weights herself daily and feel confident she understands what she can and cannot eat. [facility] and has established cardiologist to manage her CHF. She says she is able to be seen by cardiologist quickly when she needs to. She is able to afford and manager her medications. [facility].   Action/Plan: Pt plans to return home with self care today.   Expected Discharge Date:  10/12/16               Expected Discharge Plan:  Home/Self Care  In-House Referral:  NA  Discharge planning Services  CM Consult  Post Acute Care Choice:  NA Choice offered to:  NA  Status of Service:  Completed, signed off  I Malcolm MetroChildress, Cecil Vandyke Demske, RN 10/12/2016, 12:44 PM

## 2016-10-12 NOTE — Progress Notes (Signed)
Progress Note  Patient Name: Theresa Crawford Date of Encounter: 10/12/2016  Primary Cardiologist: Purvis SheffieldKoneswaran  Subjective   Feels well. No shortness of breath or leg swelling.  Inpatient Medications    Scheduled Meds: . citalopram  40 mg Oral BID  . furosemide  40 mg Oral BID  . QUEtiapine  25 mg Oral BID  . sodium chloride flush  3 mL Intravenous Q12H   Continuous Infusions:  PRN Meds: sodium chloride, acetaminophen, ondansetron (ZOFRAN) IV, sodium chloride flush   Vital Signs    Vitals:   10/11/16 2000 10/11/16 2221 10/12/16 0617 10/12/16 0746  BP:  115/66 126/73   Pulse:  84 83   Resp:  20 20   Temp: 97.7 F (36.5 C) 98.4 F (36.9 C) 98.3 F (36.8 C)   TempSrc: Axillary Axillary Oral   SpO2:  100% 100% 97%  Weight:   227 lb 11.8 oz (103.3 kg)   Height:        Intake/Output Summary (Last 24 hours) at 10/12/16 1036 Last data filed at 10/12/16 0900  Gross per 24 hour  Intake              560 ml  Output                0 ml  Net              560 ml   Filed Weights   10/10/16 0638 10/11/16 0500 10/12/16 0617  Weight: 227 lb 11.8 oz (103.3 kg) 227 lb 11.8 oz (103.3 kg) 227 lb 11.8 oz (103.3 kg)    Telemetry    NSR - Personally Reviewed  ECG     - Personally Reviewed  Physical Exam   GEN: No acute distress.   Neck: No JVD Cardiac: RRR, no murmurs, rubs, or gallops.  Respiratory: Clear to auscultation bilaterally. GI: Soft, nontender, non-distended  MS: No edema; No deformity. Neuro:  Nonfocal  Psych: Normal affect   Labs    Chemistry Recent Labs Lab 10/10/16 0329 10/11/16 0452 10/12/16 0556  NA 139 136 136  K 2.7* 4.1 3.5  CL 101 103 104  CO2 30 25 25   GLUCOSE 121* 115* 101*  BUN 15 14 14   CREATININE 0.79 0.72 0.72  CALCIUM 9.2 8.9 8.4*  PROT 7.5  --   --   ALBUMIN 3.9  --   --   AST 21  --   --   ALT 14  --   --   ALKPHOS 57  --   --   BILITOT 0.9  --   --   GFRNONAA >60 >60 >60  GFRAA >60 >60 >60  ANIONGAP 8 8 7       Hematology Recent Labs Lab 10/10/16 0329 10/11/16 0452  WBC 18.0* 13.3*  RBC 4.39 4.21  HGB 11.8* 11.1*  HCT 35.5* 33.9*  MCV 80.9 80.5  MCH 26.9 26.4  MCHC 33.2 32.7  RDW 15.2 15.4  PLT 325 326    Cardiac Enzymes Recent Labs Lab 10/10/16 0329 10/10/16 0723 10/10/16 1330 10/10/16 1838  TROPONINI <0.03 <0.03 <0.03 <0.03   No results for input(s): TROPIPOC in the last 168 hours.   BNP Recent Labs Lab 10/10/16 0329  BNP 257.0*     DDimer  Recent Labs Lab 10/10/16 0329  DDIMER 2.93*     Radiology    No results found.  Cardiac Studies   Echo 10/10/16   - Left ventricle: The cavity size was normal. Systolic  function was   normal. The estimated ejection fraction was in the range of 55%   to 60%. The study is not technically sufficient to allow   evaluation of LV diastolic function due to MV repair. - Ventricular septum: These changes are consistent with a   post-thoracotomy state. - Mitral valve: Prior procedures included surgical repair. An   annular ring prosthesis was present. Leaflet separation was   mildly reduced. Mobility of the posterior leaflet was moderately   restricted. No evidence of vegetation. The findings are   consistent with moderate to severe stenosis. Increased flow with   mild to at most MR leading to increased flow and pressure   gradients. Clinical correlation recommended. There was mild to   moderate mitral regurgitation. Mean gradient (D): 12 mm Hg. Peak   gradient (D): 24 mm Hg. Valve area by continuity equation (using   LVOT flow): 0.88 cm^2. - Left atrium: The atrium was moderately dilated. - Right atrium: The atrium was mildly dilated. - Atrial septum: No defect or patent foramen ovale was identified. - Pulmonary arteries: PA peak pressure: 45 mm Hg (S).  Impressions:  - The right ventricular systolic pressure was increased consistent   with moderate pulmonary hypertension.  Patient Profile     36 y.o. female  with h/o mitral valve repair and mitral stenosis admitted with acute shortness of breath and found to be in acute on chronic diastolic heart failure.  Assessment & Plan    1. Acute on chronic diastolic heart failure:Euvolemic. Doubt I/O accuracy. Now on Lasix orally 40 mg bid. Provide KCl at discharge. I will start 20 meq daily.  I have reviewed  the 1/28 echo images. There is restricted posterior leaflet mobility and the mean gradient does indeed appear to be 12 mmHg, consistent with severe mitral stenosis. However, the anterior leaflet has reasonable excursion. Thus morphologically, there appears to be mild to moderate valve stenosis. I will consider repeating TEE as outpatient.  2. Mitral valve stenosis with h/o repair: Mild to moderate in 05/2016 by TEE, now reported as moderate to severe by TTE with mean gradient 12 mmHg. It would be unusual for mitral stenosis to progress this rapidly and the TEE images would be the most accurate.  I have reviewed  the 1/28 echo images. There is restricted posterior leaflet mobility and the mean gradient does indeed appear to be 12 mmHg, consistent with severe mitral stenosis. However, the anterior leaflet has reasonable excursion. Thus morphologically, there appears to be mild to moderate valve stenosis. I will consider repeating TEE as outpatient.  3. Hypertension: Normal. Would hold ARB and resume Toprol-XL at lower dose of 25 mg daily.  4. Tachycardia:  Resume Toprol-XL at lower dose of 25 mg daily.  Dispo: Can be discharged from my standpoint.  Time spent: 35 minutes.   Signed, Prentice Docker, MD  10/12/2016, 10:36 AM

## 2016-10-12 NOTE — Discharge Summary (Signed)
Physician Discharge Summary  CHAITRA MAST ZOX:[id number] DOB: 03-03-1981 DOA: 10/10/2016  PCP: Jerrell Belfast, [name]  Admit date: 10/10/2016 Discharge date: 10/12/2016  Time spent: 45 minutes  Recommendations for Outpatient Follow-up:  -To be discharged home today. -We'll follow-up with cardiology in 1-2 weeks.   Discharge Diagnoses:  Principal Problem:   Acute on chronic diastolic CHF (congestive heart failure) (HCC) Active Problems:   Acute respiratory failure with hypoxia (HCC)   Postpartum complication pericarditis in 2009 with eventual needing MVR in 2015   SOB (shortness of breath)   S/P mitral valve repair   Discharge Condition: Stable and improved  Filed Weights   10/10/16 0638 10/11/16 0500 10/12/16 0617  Weight: 103.3 kg (227 lb 11.8 oz) 103.3 kg (227 lb 11.8 oz) 103.3 kg (227 lb 11.8 oz)    History of present illness:  As per Dr. Onalee Hua on 1/28: Theresa Crawford is a 36 y.o. female with medical history significant of peripartum heart failure/pericarditis subsequently needing mitral valve repair in 2015 done in [place] comes in with sudden onset of waking up from sleep with severe sob.  Pt saw dr Purvis Sheffield 4 days ago at which time her metoprolol was doubled in dose and she reports she was told she would "at some point" need her valve repaired again however this is not documented in his note from 4 days ago.  She was hospitalized in 9/17 with similar symptoms but not this severe and had a TTE and TEE which showed mild to moderated mitral regurgitation at that time.  Pt has been in her normal state of health up until waking up tonight with difficulting breathing.  She did have a lot of coughing at this time and some of it was bloody.  Denies pain.  Since arrival to the ED she has been placed on bipap as her oxygen sats were below 80%.   She is feeling much better feels she is breathing better with the bipap.  Pt referred for admission for acute chf exacerbation.  She has  had no fevers.  She reports her bp normally runs low at home.  Again no recent illnesses. No confusion.  Hospital Course:   Acute on chronic diastolic CHF -Due to severe mitral valve stenosis. -I do not believe recorded intake and output is accurate. - cardiology is planning on possibly doing a TEE as an outpatient to further assess progression of mitral valve disease. -Continue daily Lasix and potassium supplementation.  Procedures:  Echo: Ejection fraction of 55-60% not technically sufficient to allow evaluation of LV diastolic function due to MV repair. Findings consistent with moderate to severe mitral valve stenosis with gradient of 12 mmHg.  Consultations:  Cardiology  Discharge Instructions  Discharge Instructions    Diet - low sodium heart healthy    Complete by:  As directed    Increase activity slowly    Complete by:  As directed      Allergies as of 10/12/2016      Reactions   Lisinopril Shortness Of Breath, Swelling   Throat swelling   Penicillins Shortness Of Breath, Swelling   Has patient had a PCN reaction causing immediate rash, facial/tongue/throat swelling, SOB or lightheadedness with hypotension: Yes Has patient had a PCN reaction causing severe rash involving mucus membranes or skin necrosis: Yes Has patient had a PCN reaction that required hospitalization No Has patient had a PCN reaction occurring within the last 10 years: No If all of the above answers are "  NO", then may proceed with Cephalosporin use.   Bee Venom    Contrast Media [iodinated Diagnostic Agents]    difinity   Shellfish Allergy       Medication List    STOP taking these medications   bumetanide 1 MG tablet Commonly known as:  BUMEX   cyclobenzaprine 5 MG tablet Commonly known as:  FLEXERIL   diclofenac 75 MG EC tablet Commonly known as:  VOLTAREN   HYDROcodone-acetaminophen 5-325 MG tablet Commonly known as:  NORCO/VICODIN     TAKE these medications   citalopram 40 MG  tablet Commonly known as:  CELEXA Take 40 mg by mouth 2 (two) times daily.   ferrous sulfate 325 (65 FE) MG tablet Take 325 mg by mouth 2 (two) times daily with a meal.   furosemide 40 MG tablet Commonly known as:  LASIX Take 1 tablet (40 mg total) by mouth 2 (two) times daily.   losartan 25 MG tablet Commonly known as:  COZAAR Take 1 tablet (25 mg total) by mouth daily.   metoprolol succinate 50 MG 24 hr tablet Commonly known as:  TOPROL XL Take 1 tablet (50 mg total) by mouth daily. Take with or immediately following a meal.   potassium chloride SA 20 MEQ tablet Commonly known as:  K-DUR,KLOR-CON Take 1 tablet (20 mEq total) by mouth daily. Start taking on:  10/13/2016   QUEtiapine 25 MG tablet Commonly known as:  SEROQUEL Take 25 mg by mouth 2 (two) times daily.   STOOL SOFTENER 100 MG capsule Generic drug:  docusate sodium Take 100 mg by mouth daily.      Allergies  Allergen Reactions  . Lisinopril Shortness Of Breath and Swelling    Throat swelling  . Penicillins Shortness Of Breath and Swelling    Has patient had a PCN reaction causing immediate rash, facial/tongue/throat swelling, SOB or lightheadedness with hypotension: Yes Has patient had a PCN reaction causing severe rash involving mucus membranes or skin necrosis: Yes Has patient had a PCN reaction that required hospitalization No Has patient had a PCN reaction occurring within the last 10 years: No If all of the above answers are "NO", then may proceed with Cephalosporin use.   . Bee Venom   . Contrast Media [Iodinated Diagnostic Agents]     difinity  . Shellfish Allergy    Follow-up Information    Prentice DockerSuresh Koneswaran, MD. Schedule an appointment as soon as possible for a visit in 2 week(s).   Specialty:  Cardiology Contact information: [address] [place] [address] [phone]            The results of significant diagnostics from this hospitalization (including imaging, microbiology,  ancillary and laboratory) are listed below for reference.    Significant Diagnostic Studies: Dg Chest Port 1 View  Result Date: 10/10/2016 CLINICAL DATA:  CHF with mitral valve repair with onset of dyspnea and chest pain. EXAM: PORTABLE CHEST 1 VIEW COMPARISON:  None. FINDINGS: Status post mitral valve repair with median sternotomy sutures noted. Diffuse airspace opacities are seen bilaterally consistent with CHF. Superimposed pneumonia would be difficult to entirely exclude especially at the right lung base. Probable small right effusion no aortic aneurysm. No suspicious osseous abnormalities. IMPRESSION: Borderline cardiomegaly with diffuse airspace opacities consistent with CHF. Superimposed pneumonia would be difficult to entirely exclude. Electronically Signed   By: Tollie Ethavid  Kwon M.D.   On: 10/10/2016 04:17    Microbiology: Recent Results (from the past 240 hour(s))  MRSA PCR Screening  Status: None   Collection Time: 10/10/16  5:45 AM  Result Value Ref Range Status   MRSA by PCR NEGATIVE NEGATIVE Final    Comment:        The GeneXpert MRSA Assay (FDA approved for NASAL specimens only), is one component of a comprehensive MRSA colonization surveillance program. It is not intended to diagnose MRSA infection nor to guide or monitor treatment for MRSA infections.      Labs: Basic Metabolic Panel:  Recent Labs Lab 10/10/16 0329 10/10/16 1330 10/11/16 0452 10/12/16 0556  NA 139  --  136 136  K 2.7*  --  4.1 3.5  CL 101  --  103 104  CO2 30  --  25 25  GLUCOSE 121*  --  115* 101*  BUN 15  --  14 14  CREATININE 0.79  --  0.72 0.72  CALCIUM 9.2  --  8.9 8.4*  MG  --  1.7  --   --    Liver Function Tests:  Recent Labs Lab 10/10/16 0329  AST 21  ALT 14  ALKPHOS 57  BILITOT 0.9  PROT 7.5  ALBUMIN 3.9   No results for input(s): LIPASE, AMYLASE in the last 168 hours. No results for input(s): AMMONIA in the last 168 hours. CBC:  Recent Labs Lab 10/10/16 0329  10/11/16 0452  WBC 18.0* 13.3*  NEUTROABS 15.1*  --   HGB 11.8* 11.1*  HCT 35.5* 33.9*  MCV 80.9 80.5  PLT 325 326   Cardiac Enzymes:  Recent Labs Lab 10/10/16 0329 10/10/16 0723 10/10/16 1330 10/10/16 1838  TROPONINI <0.03 <0.03 <0.03 <0.03   BNP: BNP (last 3 results)  Recent Labs  04/26/16 1927 05/23/16 1752 10/10/16 0329  BNP 99.0 642.0* 257.0*    ProBNP (last 3 results) No results for input(s): PROBNP in the last 8760 hours.  CBG: No results for input(s): GLUCAP in the last 168 hours.     SignedChaya Jan  [facility] Pager: [phone] 10/12/2016, 4:36 PM

## 2016-11-10 ENCOUNTER — Other Ambulatory Visit: Payer: Self-pay

## 2016-11-10 ENCOUNTER — Telehealth: Payer: Self-pay | Admitting: Cardiology

## 2016-11-10 MED ORDER — METOPROLOL SUCCINATE ER 50 MG PO TB24: 50.0000 mg | ORAL_TABLET | Freq: Every day | ORAL | 3 refills | Status: DC

## 2016-11-10 MED ORDER — FUROSEMIDE 40 MG PO TABS: 40.0000 mg | ORAL_TABLET | Freq: Two times a day (BID) | ORAL | 2 refills | Status: DC

## 2016-11-10 NOTE — Telephone Encounter (Signed)
Pt needs refills on Metoprolol and Plavix sent to [facility]. / tg

## 2016-11-30 ENCOUNTER — Encounter: Payer: Medicaid Other | Admitting: Cardiovascular Disease

## 2017-01-04 ENCOUNTER — Ambulatory Visit: Payer: Medicaid Other | Admitting: Cardiovascular Disease

## 2017-03-23 ENCOUNTER — Observation Stay (HOSPITAL_COMMUNITY)
Admission: EM | Admit: 2017-03-23 | Discharge: 2017-03-24 | Disposition: A | Discharge status: Home / Self Care | Payer: Medicaid Other | Attending: Internal Medicine | Admitting: Internal Medicine

## 2017-03-23 ENCOUNTER — Emergency Department (HOSPITAL_COMMUNITY): Payer: Medicaid Other

## 2017-03-23 ENCOUNTER — Encounter (HOSPITAL_COMMUNITY): Payer: Self-pay | Admitting: *Deleted

## 2017-03-23 DIAGNOSIS — Z8249 Family history of ischemic heart disease and other diseases of the circulatory system: Secondary | ICD-10-CM | POA: Diagnosis not present

## 2017-03-23 DIAGNOSIS — Z9889 Other specified postprocedural states: Secondary | ICD-10-CM | POA: Diagnosis not present

## 2017-03-23 DIAGNOSIS — D649 Anemia, unspecified: Secondary | ICD-10-CM | POA: Diagnosis not present

## 2017-03-23 DIAGNOSIS — Z91013 Allergy to seafood: Secondary | ICD-10-CM | POA: Diagnosis not present

## 2017-03-23 DIAGNOSIS — F329 Major depressive disorder, single episode, unspecified: Secondary | ICD-10-CM | POA: Insufficient documentation

## 2017-03-23 DIAGNOSIS — I10 Essential (primary) hypertension: Secondary | ICD-10-CM

## 2017-03-23 DIAGNOSIS — F17210 Nicotine dependence, cigarettes, uncomplicated: Secondary | ICD-10-CM | POA: Diagnosis not present

## 2017-03-23 DIAGNOSIS — I342 Nonrheumatic mitral (valve) stenosis: Secondary | ICD-10-CM | POA: Diagnosis not present

## 2017-03-23 DIAGNOSIS — E669 Obesity, unspecified: Secondary | ICD-10-CM | POA: Insufficient documentation

## 2017-03-23 DIAGNOSIS — E876 Hypokalemia: Secondary | ICD-10-CM | POA: Insufficient documentation

## 2017-03-23 DIAGNOSIS — I272 Pulmonary hypertension, unspecified: Secondary | ICD-10-CM | POA: Insufficient documentation

## 2017-03-23 DIAGNOSIS — Z720 Tobacco use: Secondary | ICD-10-CM | POA: Diagnosis not present

## 2017-03-23 DIAGNOSIS — I050 Rheumatic mitral stenosis: Secondary | ICD-10-CM | POA: Diagnosis not present

## 2017-03-23 DIAGNOSIS — I110 Hypertensive heart disease with heart failure: Secondary | ICD-10-CM | POA: Diagnosis not present

## 2017-03-23 DIAGNOSIS — J90 Pleural effusion, not elsewhere classified: Secondary | ICD-10-CM | POA: Diagnosis not present

## 2017-03-23 DIAGNOSIS — Z91041 Radiographic dye allergy status: Secondary | ICD-10-CM | POA: Insufficient documentation

## 2017-03-23 DIAGNOSIS — Z880 Allergy status to penicillin: Secondary | ICD-10-CM | POA: Insufficient documentation

## 2017-03-23 DIAGNOSIS — Z6841 Body Mass Index (BMI) 40.0 and over, adult: Secondary | ICD-10-CM | POA: Insufficient documentation

## 2017-03-23 DIAGNOSIS — Z91030 Bee allergy status: Secondary | ICD-10-CM | POA: Insufficient documentation

## 2017-03-23 DIAGNOSIS — J9601 Acute respiratory failure with hypoxia: Secondary | ICD-10-CM | POA: Diagnosis not present

## 2017-03-23 DIAGNOSIS — Z79899 Other long term (current) drug therapy: Secondary | ICD-10-CM | POA: Insufficient documentation

## 2017-03-23 DIAGNOSIS — I5033 Acute on chronic diastolic (congestive) heart failure: Secondary | ICD-10-CM | POA: Diagnosis not present

## 2017-03-23 DIAGNOSIS — Z90710 Acquired absence of both cervix and uterus: Secondary | ICD-10-CM | POA: Diagnosis not present

## 2017-03-23 DIAGNOSIS — Z888 Allergy status to other drugs, medicaments and biological substances status: Secondary | ICD-10-CM | POA: Insufficient documentation

## 2017-03-23 DIAGNOSIS — I5031 Acute diastolic (congestive) heart failure: Secondary | ICD-10-CM | POA: Diagnosis not present

## 2017-03-23 DIAGNOSIS — Z90711 Acquired absence of uterus with remaining cervical stump: Secondary | ICD-10-CM | POA: Insufficient documentation

## 2017-03-23 LAB — BLOOD GAS, ARTERIAL
ACID-BASE EXCESS: 1.7 mmol/L (ref 0.0–2.0)
Bicarbonate: 26.5 mmol/L (ref 20.0–28.0)
Drawn by: 22223
FIO2: 21
O2 SAT: 96 %
PCO2 ART: 29.4 mmHg — AB (ref 32.0–48.0)
PO2 ART: 72.4 mmHg — AB (ref 83.0–108.0)
pH, Arterial: 7.528 — ABNORMAL HIGH (ref 7.350–7.450)

## 2017-03-23 LAB — CBC WITH DIFFERENTIAL/PLATELET
BASOS PCT: 0 %
Basophils Absolute: 0 10*3/uL (ref 0.0–0.1)
EOS ABS: 0.1 10*3/uL (ref 0.0–0.7)
Eosinophils Relative: 1 %
HCT: 31.7 % — ABNORMAL LOW (ref 36.0–46.0)
HEMOGLOBIN: 10.2 g/dL — AB (ref 12.0–15.0)
Lymphocytes Relative: 22 %
Lymphs Abs: 2.2 10*3/uL (ref 0.7–4.0)
MCH: 26 pg (ref 26.0–34.0)
MCHC: 32.2 g/dL (ref 30.0–36.0)
MCV: 80.9 fL (ref 78.0–100.0)
MONOS PCT: 3 %
Monocytes Absolute: 0.3 10*3/uL (ref 0.1–1.0)
NEUTROS PCT: 74 %
Neutro Abs: 7.4 10*3/uL (ref 1.7–7.7)
PLATELETS: 315 10*3/uL (ref 150–400)
RBC: 3.92 MIL/uL (ref 3.87–5.11)
RDW: 16.1 % — AB (ref 11.5–15.5)
WBC: 10 10*3/uL (ref 4.0–10.5)

## 2017-03-23 LAB — BASIC METABOLIC PANEL
Anion gap: 11 (ref 5–15)
BUN: 12 mg/dL (ref 6–20)
CALCIUM: 9.1 mg/dL (ref 8.9–10.3)
CO2: 24 mmol/L (ref 22–32)
CREATININE: 0.8 mg/dL (ref 0.44–1.00)
Chloride: 104 mmol/L (ref 101–111)
Glucose, Bld: 115 mg/dL — ABNORMAL HIGH (ref 65–99)
Potassium: 2.9 mmol/L — ABNORMAL LOW (ref 3.5–5.1)
SODIUM: 139 mmol/L (ref 135–145)

## 2017-03-23 LAB — TROPONIN I

## 2017-03-23 LAB — BRAIN NATRIURETIC PEPTIDE: B NATRIURETIC PEPTIDE 5: 214 pg/mL — AB (ref 0.0–100.0)

## 2017-03-23 LAB — MAGNESIUM: Magnesium: 2.1 mg/dL (ref 1.7–2.4)

## 2017-03-23 LAB — MRSA PCR SCREENING: MRSA by PCR: NEGATIVE

## 2017-03-23 MED ORDER — LIVING BETTER WITH HEART FAILURE BOOK: Freq: Once | Status: DC

## 2017-03-23 MED ORDER — FERROUS SULFATE 325 (65 FE) MG PO TABS
325.0000 mg | ORAL_TABLET | Freq: Two times a day (BID) | ORAL | Status: DC
  Administered 2017-03-23 – 2017-03-24 (×3): 325 mg via ORAL
  Filled 2017-03-23 (×4): qty 1

## 2017-03-23 MED ORDER — NICOTINE 7 MG/24HR TD PT24
7.0000 mg | MEDICATED_PATCH | Freq: Every day | TRANSDERMAL | Status: DC
  Administered 2017-03-23 – 2017-03-24 (×2): 7 mg via TRANSDERMAL
  Filled 2017-03-23 (×4): qty 1

## 2017-03-23 MED ORDER — DOCUSATE SODIUM 100 MG PO CAPS
100.0000 mg | ORAL_CAPSULE | Freq: Every day | ORAL | Status: DC
  Administered 2017-03-23 – 2017-03-24 (×2): 100 mg via ORAL
  Filled 2017-03-23 (×2): qty 1

## 2017-03-23 MED ORDER — POTASSIUM CHLORIDE CRYS ER 20 MEQ PO TBCR
40.0000 meq | EXTENDED_RELEASE_TABLET | ORAL | Status: AC
  Administered 2017-03-23 (×2): 40 meq via ORAL
  Filled 2017-03-23 (×2): qty 2

## 2017-03-23 MED ORDER — POTASSIUM CHLORIDE CRYS ER 20 MEQ PO TBCR
40.0000 meq | EXTENDED_RELEASE_TABLET | Freq: Two times a day (BID) | ORAL | Status: AC
  Administered 2017-03-23 (×2): 40 meq via ORAL
  Filled 2017-03-23 (×2): qty 2

## 2017-03-23 MED ORDER — ENOXAPARIN SODIUM 40 MG/0.4ML ~~LOC~~ SOLN
40.0000 mg | SUBCUTANEOUS | Status: DC
  Administered 2017-03-23 – 2017-03-24 (×2): 40 mg via SUBCUTANEOUS
  Filled 2017-03-23 (×2): qty 0.4

## 2017-03-23 MED ORDER — ONDANSETRON HCL 4 MG/2ML IJ SOLN: 4.0000 mg | Freq: Four times a day (QID) | INTRAMUSCULAR | Status: DC | PRN

## 2017-03-23 MED ORDER — FUROSEMIDE 10 MG/ML IJ SOLN
40.0000 mg | Freq: Every day | INTRAMUSCULAR | Status: AC
  Administered 2017-03-23 – 2017-03-24 (×2): 40 mg via INTRAVENOUS
  Filled 2017-03-23 (×2): qty 4

## 2017-03-23 MED ORDER — FUROSEMIDE 10 MG/ML IJ SOLN
40.0000 mg | Freq: Once | INTRAMUSCULAR | Status: AC
  Administered 2017-03-23: 40 mg via INTRAVENOUS
  Filled 2017-03-23: qty 4

## 2017-03-23 MED ORDER — ALBUTEROL SULFATE (2.5 MG/3ML) 0.083% IN NEBU
5.0000 mg | INHALATION_SOLUTION | Freq: Once | RESPIRATORY_TRACT | Status: AC
  Administered 2017-03-23: 5 mg via RESPIRATORY_TRACT
  Filled 2017-03-23: qty 6

## 2017-03-23 MED ORDER — SODIUM CHLORIDE 0.9 % IV SOLN: 250.0000 mL | INTRAVENOUS | Status: DC | PRN

## 2017-03-23 MED ORDER — ALBUTEROL SULFATE (2.5 MG/3ML) 0.083% IN NEBU
INHALATION_SOLUTION | RESPIRATORY_TRACT | Status: AC
  Filled 2017-03-23: qty 3

## 2017-03-23 MED ORDER — SODIUM CHLORIDE 0.9% FLUSH: 3.0000 mL | INTRAVENOUS | Status: DC | PRN

## 2017-03-23 MED ORDER — ACETAMINOPHEN 325 MG PO TABS: 650.0000 mg | ORAL_TABLET | ORAL | Status: DC | PRN

## 2017-03-23 MED ORDER — POTASSIUM CHLORIDE 10 MEQ/100ML IV SOLN
10.0000 meq | INTRAVENOUS | Status: AC
  Administered 2017-03-23 (×2): 10 meq via INTRAVENOUS
  Filled 2017-03-23 (×2): qty 100

## 2017-03-23 MED ORDER — ALBUTEROL SULFATE (2.5 MG/3ML) 0.083% IN NEBU
2.5000 mg | INHALATION_SOLUTION | RESPIRATORY_TRACT | Status: DC | PRN
  Administered 2017-03-24: 2.5 mg via RESPIRATORY_TRACT
  Filled 2017-03-23: qty 3

## 2017-03-23 MED ORDER — SODIUM CHLORIDE 0.9% FLUSH
3.0000 mL | Freq: Two times a day (BID) | INTRAVENOUS | Status: DC
  Administered 2017-03-23 – 2017-03-24 (×2): 3 mL via INTRAVENOUS

## 2017-03-23 NOTE — Care Management Note (Addendum)
Case Management Note  Patient Details  Name: Janalee DaneLatasha Y Yoakum MRN: [id number] Date of Birth: 1980-09-27  Subjective/Objective:  Adm with CHF. From home, ind PTA. Reports compliance with medications and diet. She reports weighing but not daily, stating her scale is "not that good". CM will give patient new scales and counseled on the importance of weighing daily. [facility], cardiologist, transportation to appointments and Medicaid covers her prescriptions.                   Action/Plan: Plans to return home with self care.   ADDENDUM: Anticipate weaning to room air. Currently on 1 liter.   Expected Discharge Date:       03/24/2017           Expected Discharge Plan:  Home/Self Care  In-House Referral:     Discharge planning Services  (S) CM Consult, Other - See comment (given weighing scales)  Post Acute Care Choice:    Choice offered to:  NA  DME Arranged:    DME Agency:     HH Arranged:    HH Agency:     Status of Service:  In process, will continue to follow  If discussed at Long Length of Stay Meetings, dates discussed:    Additional Comments:  Patrick Sohm, Chrystine OilerSharley Diane, RN 03/23/2017, 11:28 AM

## 2017-03-23 NOTE — ED Notes (Signed)
Lab at bedside for blood draw, RT notified for ABG and bipap

## 2017-03-23 NOTE — Consult Note (Signed)
Cardiology Consultation:   Patient ID: Theresa DaneLatasha Y Crawford; [id number]; 1981-01-13   Admit date: 03/23/2017 Date of Consult: 03/23/2017  Primary Care Provider: Jerrell BelfastHouse, [name], [name] Primary Cardiologist: Dr Purvis SheffieldKoneswaran   Patient Profile:   Theresa Crawford is a 36 y.o. female with a hx of mitral valve disease who is being seen today for the evaluation of CHF at the request of Dr Ardyth HarpsHernandez.  History of Present Illness:   Theresa Crawford is a pleasant 36 y/o AA female with a history of mitral valve disease. She had a MV repair 03/27/13 at [facility] in [place], [place] with a 30 mm CE ring, along with excision of the posterior leaflet chordae and resuspension. She was noted to have moderate mitral valve stenosis s/p repair.  She has subsequently been admitted for CHF in 2016, 2017, Jan 2018, and last night.   A TTE in Sept showed severe MS. A TEE was done after this on 05/26/17 and reviewed by Dr Purvis SheffieldKoneswaran during her Jan 2018 admission.  It showed restricted posterior leaflet mobility and the mean gradient did indeed appear to be 12 mmHg, consistent with severe mitral stenosis. However,he felt the anterior leaflet had reasonable excursion. Thus morphologically, there appeared to be mild to moderate valve stenosis. She was to have a f/u TEE after this admission but it was never done.  She presented late last night to the ED with sudden SOB. The pt tells me she was watching TV in bed when she became SOB. This was associated with tachycardia. In the ED her EKG showed NSR-95. She required transient BiPap for respiratory distress. Her BNP was 214. CXR suggested CHF. She is comfortable on exam now after 650cc diuresis. She did tell me that her heart races frequently at home. There is no prior documented arrhythmia. She reports compliance with her medications.  Past Medical History:  Diagnosis Date  . CHF (congestive heart failure) (HCC)   . Congestive heart failure (HCC)   . History of open heart  surgery   . Hypertension   . Mitral valve disease    mitral regurgitation    Past Surgical History:  Procedure Laterality Date  . ABDOMINAL HYSTERECTOMY    . CESAREAN SECTION     X's 2  . MITRAL VALVE REPAIR  03/2013    [facility] in [place]   . PARTIAL HYSTERECTOMY  2011   PID w/problem with fallopian tubes  . TEE WITHOUT CARDIOVERSION N/A 05/26/2016   Procedure: TRANSESOPHAGEAL ECHOCARDIOGRAM (TEE);  Surgeon: Laqueta LindenSuresh A Koneswaran, MD;  Location: [facility];  Service: Cardiovascular;  Laterality: N/A;     Inpatient Medications: Scheduled Meds: . docusate sodium  100 mg Oral Daily  . enoxaparin (LOVENOX) injection  40 mg Subcutaneous Q24H  . ferrous sulfate  325 mg Oral BID WC  . furosemide  40 mg Intravenous Daily  . Living Better with Heart Failure Book   Does not apply Once  . nicotine  7 mg Transdermal Daily  . potassium chloride  40 mEq Oral BID  . potassium chloride  40 mEq Oral Q4H  . sodium chloride flush  3 mL Intravenous Q12H   Continuous Infusions: . sodium chloride     PRN Meds: sodium chloride, acetaminophen, ondansetron (ZOFRAN) IV, sodium chloride flush  Allergies:    Allergies  Allergen Reactions  . Lisinopril Shortness Of Breath and Swelling    Throat swelling  . Penicillins Shortness Of Breath and Swelling    Has patient had  a PCN reaction causing immediate rash, facial/tongue/throat swelling, SOB or lightheadedness with hypotension: Yes Has patient had a PCN reaction causing severe rash involving mucus membranes or skin necrosis: Yes Has patient had a PCN reaction that required hospitalization No Has patient had a PCN reaction occurring within the last 10 years: No If all of the above answers are "NO", then may proceed with Cephalosporin use.   . Bee Venom   . Contrast Media [Iodinated Diagnostic Agents]     difinity  . Shellfish Allergy     Social History:   Social History   Social History  . Marital status: Unknown     Spouse name: N/A  . Number of children: N/A  . Years of education: N/A   Occupational History  . Not on file.   Social History Main Topics  . Smoking status: Current Some Day Smoker    Packs/day: 0.25    Types: Cigarettes    Start date: 11/03/1999  . Smokeless tobacco: Never Used  . Alcohol use No  . Drug use: No  . Sexual activity: Not on file   Other Topics Concern  . Not on file   Social History Narrative  . No narrative on file    Family History:   The patient's family history includes Heart disease in her paternal grandfather; Heart failure in her father.  ROS:  Please see the history of present illness.  ROS  All other ROS reviewed and negative.     Physical Exam/Data:   Vitals:   03/23/17 0636 03/23/17 0726 03/23/17 0744 03/23/17 1133  BP:      Pulse: 74 66 65 79  Resp: (!) 39 19 (!) 21 (!) 22  Temp: 98.3 F (36.8 C) 98.2 F (36.8 C)  98.3 F (36.8 C)  TempSrc: Axillary Axillary  Oral  SpO2: 100% 100% 100% 97%  Weight: 235 lb 0.2 oz (106.6 kg)     Height: 5\' 4"  (1.626 m)       Intake/Output Summary (Last 24 hours) at 03/23/17 1248 Last data filed at 03/23/17 0529  Gross per 24 hour  Intake              100 ml  Output              750 ml  Net             -650 ml   Filed Weights   03/23/17 0032 03/23/17 0636  Weight: 230 lb (104.3 kg) 235 lb 0.2 oz (106.6 kg)   Body mass index is 40.34 kg/m.  General:  Obese AA female in no acute distress HEENT: normal Lymph: no adenopathy Neck: no JVD Endocrine:  No thryomegaly Vascular: No carotid bruits; FA pulses 2+ bilaterally without bruits  Cardiac:  normal S1, S2; RRR; no murmur  Lungs:  clear to auscultation bilaterally, no wheezing, rhonchi or rales  Abd: soft, nontender, no hepatomegaly  Ext: no edema Musculoskeletal:  No deformities, BUE and BLE strength normal and equal Skin: warm and dry  Neuro:  CNs 2-12 intact, no focal abnormalities noted Psych:  Normal affect   EKG:  The EKG was  personally reviewed and demonstrates:  NSR Telemetry:  Telemetry was personally reviewed and demonstrates:  NSR  Relevant CV Studies: TEE 05/26/16- Study Conclusions  - Left ventricle: Systolic function was normal. The estimated   ejection fraction was in the range of 55% to 60%. Wall motion was   normal; there were no regional wall motion abnormalities. -  Mitral valve: Mitral annular ring present. Mild to moderate   stenosis present. There was mild regurgitation. Mean velocity   (D): 106 cm/s. Mean gradient (D): 5 mm Hg. Valve area by pressure   half-time: 2.75 cm^2. Indexed valve area by pressure half-time:   1.26 cm^2/m^2. - Left atrium: The atrium was moderately dilated. - Right ventricle: Systolic function was mildly reduced. - Right atrium: The atrium was mildly dilated. - Atrial septum: No defect or patent foramen ovale was identified. - Tricuspid valve: There was mild regurgitation. - Pulmonary arteries: PA peak pressure: 32 mm Hg (S).  TTE 10/10/16- Study Conclusions  - Left ventricle: The cavity size was normal. Systolic function was   normal. The estimated ejection fraction was in the range of 55%   to 60%. The study is not technically sufficient to allow   evaluation of LV diastolic function due to MV repair. - Ventricular septum: These changes are consistent with a   post-thoracotomy state. - Mitral valve: Prior procedures included surgical repair. An   annular ring prosthesis was present. Leaflet separation was   mildly reduced. Mobility of the posterior leaflet was moderately   restricted. No evidence of vegetation. The findings are   consistent with moderate to severe stenosis. Increased flow with   mild to at most MR leading to increased flow and pressure   gradients. Clinical correlation recommended. There was mild to   moderate mitral regurgitation. Mean gradient (D): 12 mm Hg. Peak   gradient (D): 24 mm Hg. Valve area by continuity equation (using   LVOT  flow): 0.88 cm^2. - Left atrium: The atrium was moderately dilated. - Right atrium: The atrium was mildly dilated. - Atrial septum: No defect or patent foramen ovale was identified. - Pulmonary arteries: PA peak pressure: 45 mm Hg (S).  Impressions:  - The right ventricular systolic pressure was increased consistent   with moderate pulmonary hypertension. Laboratory Data:  Chemistry Recent Labs Lab 03/23/17 0138  NA 139  K 2.9*  CL 104  CO2 24  GLUCOSE 115*  BUN 12  CREATININE 0.80  CALCIUM 9.1  GFRNONAA >60  GFRAA >60  ANIONGAP 11    No results for input(s): PROT, ALBUMIN, AST, ALT, ALKPHOS, BILITOT in the last 168 hours. Hematology Recent Labs Lab 03/23/17 0138  WBC 10.0  RBC 3.92  HGB 10.2*  HCT 31.7*  MCV 80.9  MCH 26.0  MCHC 32.2  RDW 16.1*  PLT 315   Cardiac Enzymes Recent Labs Lab 03/23/17 0138  TROPONINI <0.03   No results for input(s): TROPIPOC in the last 168 hours.  BNP Recent Labs Lab 03/23/17 0138  BNP 214.0*    DDimer No results for input(s): DDIMER in the last 168 hours.  Radiology/Studies:  Dg Chest Portable 1 View  Result Date: 03/23/2017 CLINICAL DATA:  Shortness of breath.  Cough. EXAM: PORTABLE CHEST 1 VIEW COMPARISON:  Radiograph 10/10/2016, and 06/28/2016. Chest CT 06/10/2016 FINDINGS: Post median sternotomy. Stable cardiomegaly. Persistent but improved ill-defined bilateral opacities, mid and lower lung zone distribution. Minimal fluid in the right minor fissure. Small pleural effusions, right greater than left. No confluent airspace disease. Multiple monitoring devices overlie the chest. IMPRESSION: Cardiomegaly. Ill-defined opacities in mid and lower lung zone distribution is consistent with pulmonary edema, improved from comparison exam 6 months prior. Small pleural effusions are also improved. Electronically Signed   By: Rubye Oaks M.D.   On: 03/23/2017 01:15    Assessment and Plan:   1. Acute on chronic  diastolic  CHF- I wonder if she had transient tachycarrhythmia which contributed to this.Her LA was moderately dilated at echo.  2. MV stenosis- s/p MV repair with ring in 2014. Post repair stenosis-? Moderate  3. Pulmonary HTN- PA 45 mmHg by echo Jan 2018  4. Essential Hypertension- B/P was low on admission  5. Obesity- BMI 40  Plan- MD to see for further recomendations. 3'd admission in less than 12 months for CHF. Consider resuming beta blocker at lower dose. Cozaar on hold secondary to low B/P.   Consider an OP monitor after discharge.    Document TSH.    Jolene Provost, PA-C  03/23/2017 12:48 PM   Attending note Patient seen and discussed with PA Kil[place]. Medical history as reported above. Presents with SOB and evidence of acute on chronic diastolic HF, complicated by post MV repair mitral stenosis and pulmonary HTN.  Initially managed on bipap and IV lasix. Similar presentation Jan 2018 and 05/2016. She reports doing well up until yesterday, where she had significant palpitations followed by significant SOB. REports compliance with meds.   Hgb 10.2 Plt 315 K 2.9, Cr 0.8 BNP 214  Trop neg ABG 7.52/29/72/27 CXR cardiomegaly, +pulmonary edema EKG SR no ischemic changes  Acute on chronic diastolic HF, complicated by post MV repair mitral stenosis. Given her history of palpitations, a tachycarrhythmia may be exacerbating her HF, with her MS she does not tolerate tachcyardia well. Monitor on tele here, likely will plan for outpatient monitor. Continue IV diuretics at his time   Dominga Ferry MD

## 2017-03-23 NOTE — Progress Notes (Signed)
Patient seen and examined. Database reviewed. Admitted earlier today with SOB. Known to have severe mitral valve stenosis. Has been compliant with her Bumex. Required BIPAP on admission. Has had significant improvement with diuresis. Will ask cards to see as this is her second hospitalization in 6 months for the same. Wonder if it is time to secure surgical opinion? Can likely transfer to floor today. Continue lasix. Will continue to follow.  Peggye PittEstela Hernandez, MD [facility] Pager: [phone]

## 2017-03-23 NOTE — H&P (Addendum)
[facility] History and Physical  Theresa Crawford RUE:[id number] DOB: 16-May-1981 DOA: 03/23/2017  PCP: Jerrell Belfast, [name]  Patient coming from: Home  Chief Complaint: Shortness of breath  HPI: Theresa Crawford is a 36 y.o. female with a medical history of diastolic heart failure, mitral valve disease with repair, hypertension, who presented to the emergency department with complaints of shortness of breath. She states she had a cigarette around 10pm and was fine. She started having a sudden onset of shortness of breath and productive cough around 11:30pm. She does not recall having any other symptoms or chest pain with the shortness of breath. She denies any recent weight gain and has been taking Bumex and lasix as prescribed. She does complain of some "puffiness and tightness" in her hands and around her eyes. She denies and calf tenderness, recent immobilization or travel. She currently feels her breathing has improved since having BIPAP on and she received a breathing treatment in route to the hospital by EMS. Currently she denies any abdominal pain, nausea or vomiting, diarrhea constipation, changes in urinary or bowel pattern, recent illness or sick contacts.  ED Course: Found to be hypoxic, short of breath, placed on BiPAP with improvement and was given lasix IV 40mg  with potassium supplementation.  Chest x-ray showed some pulmonary vascular congestion however improved compared to 6 months prior. [facility] called for admission.  Review of Systems:  All other systems reviewed and are negative.   Past Medical History:  Diagnosis Date  . CHF (congestive heart failure) (HCC)   . Congestive heart failure (HCC)   . History of open heart surgery   . Hypertension   . Mitral valve disease    mitral regurgitation    Past Surgical History:  Procedure Laterality Date  . ABDOMINAL HYSTERECTOMY    . CESAREAN SECTION     X's 2  . MITRAL VALVE REPAIR    . open heart surgery    . PARTIAL  HYSTERECTOMY  2011   PID w/problem with fallopian tubes  . TEE WITHOUT CARDIOVERSION N/A 05/26/2016   Procedure: TRANSESOPHAGEAL ECHOCARDIOGRAM (TEE);  Surgeon: [name], MD;  Location: [facility];  Service: Cardiovascular;  Laterality: N/A;    Social History:  reports that she has been smoking Cigarettes.  She started smoking about 17 years ago. She has been smoking about 0.25 packs per day. She has never used smokeless tobacco. She reports that she does not drink alcohol or use drugs.   Allergies  Allergen Reactions  . Lisinopril Shortness Of Breath and Swelling    Throat swelling  . Penicillins Shortness Of Breath and Swelling    Has patient had a PCN reaction causing immediate rash, facial/tongue/throat swelling, SOB or lightheadedness with hypotension: Yes Has patient had a PCN reaction causing severe rash involving mucus membranes or skin necrosis: Yes Has patient had a PCN reaction that required hospitalization No Has patient had a PCN reaction occurring within the last 10 years: No If all of the above answers are "NO", then may proceed with Cephalosporin use.   . Bee Venom   . Contrast Media [Iodinated Diagnostic Agents]     difinity  . Shellfish Allergy     Family History  Problem Relation Age of Onset  . Heart failure Father        just received LVAD  . Heart disease Paternal Grandfather        stent placement     Prior to Admission medications   Medication  Sig Start Date End Date Taking? Authorizing Provider  citalopram (CELEXA) 40 MG tablet Take 40 mg by mouth 2 (two) times daily.    Yes [provider]  ferrous sulfate 325 (65 FE) MG tablet Take 325 mg by mouth 2 (two) times daily with a meal.   Yes [provider]  furosemide (LASIX) 40 MG tablet Take 1 tablet (40 mg total) by mouth 2 (two) times daily. 11/10/16  Yes [name], MD  losartan (COZAAR) 25 MG tablet Take 1 tablet (25 mg total) by mouth daily. 05/28/16  Yes  Jeralyn Bennett, MD  metoprolol succinate (TOPROL XL) 50 MG 24 hr tablet Take 1 tablet (50 mg total) by mouth daily. Take with or immediately following a meal. 11/10/16  Yes [name], MD  potassium chloride SA (K-DUR,KLOR-CON) 20 MEQ tablet Take 1 tablet (20 mEq total) by mouth daily. 10/13/16  Yes Philip Aspen, Limmie Patricia, MD  QUEtiapine (SEROQUEL) 25 MG tablet Take 25 mg by mouth 2 (two) times daily.   Yes [provider]  STOOL SOFTENER 100 MG capsule Take 100 mg by mouth daily. 10/29/15  Yes [provider]    Physical Exam: Vitals:   03/23/17 0211 03/23/17 0215  BP: (!) 98/40 (!) 98/40  Pulse: 87 78  Resp: 17 19  Temp:       General: Well developed, well nourished, NAD, appears stated age  HEENT: NCAT, PERRLA, EOMI, Anicteic Sclera, mucous membranes moist. BiPAP in place.   Neck: Supple, no masses, unable to assess JVD given current positioning   Cardiovascular: S1 S2 auscultated, no rubs, murmurs or gallops. Regular rate and rhythm.  Respiratory: Basilar crackles, otherwise equal chest rise, no wheezing   Abdomen: Soft, obese, nontender, nondistended, + bowel sounds  Extremities: warm dry without cyanosis clubbing. Trace LE edema B/L   Neuro: AAOx3, cranial nerves grossly intact. Strength 5/5 in patient's upper and lower extremities bilaterally  Skin: Without rashes exudates or nodules  Psych: Normal affect and demeanor with intact judgement and insight  Labs on Admission: I have personally reviewed following labs and imaging studies CBC:  Recent Labs Lab 03/23/17 0138  WBC 10.0  NEUTROABS 7.4  HGB 10.2*  HCT 31.7*  MCV 80.9  PLT 315   Basic Metabolic Panel: No results for input(s): NA, K, CL, CO2, GLUCOSE, BUN, CREATININE, CALCIUM, MG, PHOS in the last 168 hours. GFR: CrCl cannot be calculated (Patient's most recent lab result is older than the maximum 21 days allowed.). Liver Function Tests: No results for input(s): AST,  ALT, ALKPHOS, BILITOT, PROT, ALBUMIN in the last 168 hours. No results for input(s): LIPASE, AMYLASE in the last 168 hours. No results for input(s): AMMONIA in the last 168 hours. Coagulation Profile: No results for input(s): INR, PROTIME in the last 168 hours. Cardiac Enzymes: No results for input(s): CKTOTAL, CKMB, CKMBINDEX, TROPONINI in the last 168 hours. BNP (last 3 results) No results for input(s): PROBNP in the last 8760 hours. HbA1C: No results for input(s): HGBA1C in the last 72 hours. CBG: No results for input(s): GLUCAP in the last 168 hours. Lipid Profile: No results for input(s): CHOL, HDL, LDLCALC, TRIG, CHOLHDL, LDLDIRECT in the last 72 hours. Thyroid Function Tests: No results for input(s): TSH, T4TOTAL, FREET4, T3FREE, THYROIDAB in the last 72 hours. Anemia Panel: No results for input(s): VITAMINB12, FOLATE, FERRITIN, TIBC, IRON, RETICCTPCT in the last 72 hours. Urine analysis:    Component Value Date/Time   COLORURINE YELLOW 05/23/2016 2027  APPEARANCEUR CLEAR 05/23/2016 2027   LABSPEC <1.005 (L) 05/23/2016 2027   PHURINE 6.5 05/23/2016 2027   GLUCOSEU NEGATIVE 05/23/2016 2027   HGBUR NEGATIVE 05/23/2016 2027   BILIRUBINUR NEGATIVE 05/23/2016 2027   KETONESUR NEGATIVE 05/23/2016 2027   PROTEINUR NEGATIVE 05/23/2016 2027   NITRITE NEGATIVE 05/23/2016 2027   LEUKOCYTESUR TRACE (A) 05/23/2016 2027   Sepsis Labs: @LABRCNTIP (procalcitonin:4,lacticidven:4) )No results found for this or any previous visit (from the past 240 hour(s)).   Radiological Exams on Admission: Dg Chest Portable 1 View  Result Date: 03/23/2017 CLINICAL DATA:  Shortness of breath.  Cough. EXAM: PORTABLE CHEST 1 VIEW COMPARISON:  Radiograph 10/10/2016, and 06/28/2016. Chest CT 06/10/2016 FINDINGS: Post median sternotomy. Stable cardiomegaly. Persistent but improved ill-defined bilateral opacities, mid and lower lung zone distribution. Minimal fluid in the right minor fissure. Small pleural  effusions, right greater than left. No confluent airspace disease. Multiple monitoring devices overlie the chest. IMPRESSION: Cardiomegaly. Ill-defined opacities in mid and lower lung zone distribution is consistent with pulmonary edema, improved from comparison exam 6 months prior. Small pleural effusions are also improved. Electronically Signed   By: Rubye OaksMelanie  Ehinger M.D.   On: 03/23/2017 01:15    EKG: Independently reviewed. Sinus rhythm, rate 95  Assessment/Plan Acute hypoxic respiratory failure secondary to acute CHF exacerbation -ABG showed PO2 of 72.4 (pH 7.5-8, PCO2 29.4) -Chest x-ray showed pulmonary edema with small pleural effusions (improved compared to 6 months prior) -Echocardiogram 10/02/2016 showed an EF of 55-60%, moderate pulmonary hypertension -Feels improved after being placed on BiPAP -Continue BiPAP and wean as possible -Continue IV Lasix  -Hold home dose of lasix, patient also states she is on Bumex 1mg  BID at home (not listed on med rec) -Monitor intake and output, daily weights -Of note, given patient's sudden onset of SOB, PE would be considered, however patient has IV contrast allergy. She had an elevated DDimer in January when she presented with similar complaints. Would continue IV lasix and BIPAP, if no improvement, may consider PE/DVT workup.  -Cardiology consulted and appreciated [facility]  Mitral valve stenosis -Echocardiogram January 2018 showed moderate to severe mitral valve stenosis with gradient of 12 mmHg. -Per cardiology note from 10/12/2016, TEE as an outpatient was considered.  -Patient states she had another TEE done after discharge in January, however, I cannot find the results in [facility]  Essential hypertension -Will hold home medications of Cozaar, metoprolol given the patient currently has soft BP and will give diuresis for CHF  Hypokalemia -Will replace potassium, obtain magnesium and monitor BMP  Chronic normocytic  anemia -Hemoglobin currently stable, continue iron supplementation and monitor CBC  Depression -No longer taking Seroquel, Celexa  Tobacco abuse -Smoking cessation discussed -Nicotine patch ordered  DVT prophylaxis: Lovenox  Code Status: Full  Family Communication: Friend at bedside. Admission, patients condition and plan of care including tests being ordered have been discussed with the patient and friend who indicate understanding and agree with the plan and Code Status.  Disposition Plan: home  Consults called: None   Admission status: Inpatient    Time spent: 70 minutes  Annaliz Aven D.O. [facility] Pager [phone]  If 7PM-7AM, please contact night-coverage [email or web address] Password [email or web address] 03/23/2017, 2:48 AM

## 2017-03-23 NOTE — ED Triage Notes (Signed)
Pt arrived by EMS C/O SOB. Pt given 1 albuterol tx. Pt describes a tightness in her chest. Feels better after treatment.

## 2017-03-23 NOTE — ED Notes (Signed)
Pt c/o of chest tightness with increased SOB and respirations. Dr Wilkie AyeHorton made aware.

## 2017-03-23 NOTE — ED Provider Notes (Signed)
[facility]-EMER[facility]ENCY DEPT Provider Note   CSN: [id number] Arrival date & time: 03/23/17  0029     History   Chief Complaint Chief Complaint  Patient presents with  . Shortness of Breath    HPI Theresa Crawford is a 36 y.o. female.  HPI  This is a 36 year old female with a history of heart failure status post mitral valve replacement who presents with acute shortness of breath. Patient reports onset of shortness of breath acutely just prior to arrival. She states that she felt fine and then all of a sudden felt short of breath. She reports chest tightness. No history of COPD; however, she is a smoker. Reports some improvement by EMS DuoNeb. Denies any recent leg swelling or history of blood clots. She takes Lasix daily. When asked about her weight or swelling, patient states that she feels like her hands are tight in her eyes are puffy. She reports compliance with medications. Denies any fevers or cough.  Past Medical History:  Diagnosis Date  . CHF (congestive heart failure) (HCC)   . Congestive heart failure (HCC)   . History of open heart surgery   . Hypertension   . Mitral valve disease    mitral regurgitation    Patient Active Problem List   Diagnosis Date Noted  . Acute pulmonary edema (HCC) 10/10/2016  . S/P mitral valve repair   . CAP (community acquired pneumonia) 05/25/2016  . Leukocytosis 05/24/2016  . Acute respiratory failure with hypoxia (HCC) 05/23/2016  . Acute on chronic diastolic CHF (congestive heart failure) (HCC) 05/23/2016  . Cough with hemoptysis 05/23/2016  . Postpartum complication pericarditis in 2009 with eventual needing MVR in 2015 05/23/2016  . Anemia, iron deficiency 05/23/2016  . Hypertension   . SOB (shortness of breath)   . Respiratory distress 02/16/2016  . Essential hypertension 02/16/2016  . Congestive heart failure (HCC) 11/26/2015    Past Surgical History:  Procedure Laterality Date  . ABDOMINAL HYSTERECTOMY    . CESAREAN SECTION     X's 2  . MITRAL VALVE REPAIR    . open heart surgery    . PARTIAL HYSTERECTOMY  2011   PID w/problem with fallopian tubes  . TEE WITHOUT CARDIOVERSION N/A 05/26/2016   Procedure: TRANSESOPHAGEAL ECHOCARDIOGRAM (TEE);  Surgeon: [name], MD;  Location: [facility];  Service: Cardiovascular;  Laterality: N/A;    OB History    Gravida Para Term Preterm AB Living   2 2 2          SAB TAB Ectopic Multiple Live Births                   Home Medications    Prior to Admission medications   Medication Sig Start Date End Date Taking? Authorizing Provider  citalopram (CELEXA) 40 MG tablet Take 40 mg by mouth 2 (two) times daily.    Yes [provider]  ferrous sulfate 325 (65 FE) MG tablet Take 325 mg by mouth 2 (two) times daily with a meal.   Yes [provider]  furosemide (LASIX) 40 MG tablet Take 1 tablet (40 mg total) by mouth 2 (two) times daily. 11/10/16  Yes [name], MD  losartan (COZAAR) 25 MG tablet Take 1 tablet (25 mg total) by mouth daily. 05/28/16  Yes Jeralyn Bennett, MD  metoprolol succinate (TOPROL XL) 50 MG 24 hr tablet Take 1 tablet (50 mg total) by mouth daily. Take with or immediately following a meal. 11/10/16  Yes Koneswaran,  Ottie GlazierSuresh A, MD  potassium chloride SA (K-DUR,KLOR-CON) 20 MEQ tablet Take 1 tablet (20 mEq total) by mouth daily. 10/13/16  Yes Philip AspenHernandez Acosta, Limmie PatriciaEstela Y, MD  QUEtiapine (SEROQUEL) 25 MG tablet Take 25 mg by mouth 2 (two) times daily.   Yes [provider]  STOOL SOFTENER 100 MG capsule Take 100 mg by mouth daily. 10/29/15  Yes [provider]    Family History Family History  Problem Relation Age of Onset  . Heart failure Father        just received LVAD  . Heart disease Paternal Grandfather        stent placement    Social History Social History  Substance Use Topics  . Smoking status: Current Some Day Smoker    Packs/day: 0.25    Types: Cigarettes    Start date: 11/03/1999  .  Smokeless tobacco: Never Used  . Alcohol use No     Allergies   Lisinopril; Penicillins; Bee venom; Contrast media [iodinated diagnostic agents]; and Shellfish allergy   Review of Systems Review of Systems  Constitutional: Negative for fever.  Respiratory: Positive for chest tightness, shortness of breath and wheezing. Negative for cough.   Cardiovascular: Negative for chest pain, palpitations and leg swelling.  Gastrointestinal: Negative for nausea and vomiting.  Psychiatric/Behavioral: The patient is nervous/anxious.   All other systems reviewed and are negative.    Physical Exam Updated Vital Signs BP (!) 98/40   Pulse 78   Temp 98.4 F (36.9 C) (Oral)   Resp 19   Ht 5\' 4"  (1.626 m)   Wt 104.3 kg (230 lb)   LMP 03/04/2017   SpO2 100%   BMI 39.48 kg/m   Physical Exam  Constitutional: She is oriented to person, place, and time. She appears well-developed and well-nourished.  Overweight, tachypnea noted  HENT:  Head: Normocephalic and atraumatic.  Mucous membranes moist  Cardiovascular: Normal rate, regular rhythm and normal heart sounds.   No murmur heard. Pulmonary/Chest: She is in respiratory distress. She has no wheezes.  Crackles bilateral lower lungs, tachypnea with increased work of breathing  Abdominal: Soft. Bowel sounds are normal. There is no tenderness. There is no guarding.  Musculoskeletal:  Trace lower extremity edema  Neurological: She is alert and oriented to person, place, and time.  Skin: Skin is warm and dry.  Psychiatric: She has a normal mood and affect.  Nursing note and vitals reviewed.    ED Treatments / Results  Labs (all labs ordered are listed, but only abnormal results are displayed) Labs Reviewed  CBC WITH DIFFERENTIAL/PLATELET - Abnormal; Notable for the following:       Result Value   Hemoglobin 10.2 (*)    HCT 31.7 (*)    RDW 16.1 (*)    All other components within normal limits  BASIC METABOLIC PANEL - Abnormal; Notable  for the following:    Potassium 2.9 (*)    Glucose, Bld 115 (*)    All other components within normal limits  BRAIN NATRIURETIC PEPTIDE - Abnormal; Notable for the following:    B Natriuretic Peptide 214.0 (*)    All other components within normal limits  BLOOD GAS, ARTERIAL - Abnormal; Notable for the following:    pH, Arterial 7.528 (*)    pCO2 arterial 29.4 (*)    pO2, Arterial 72.4 (*)    All other components within normal limits  TROPONIN I    EKG  EKG Interpretation  Date/Time:  Wednesday March 23 2017 00:54:21  EDT Ventricular Rate:  95 PR Interval:    QRS Duration: 91 QT Interval:  367 QTC Calculation: 462 R Axis:   44 Text Interpretation:  Sinus rhythm Probable left atrial enlargement No significant change since last tracing Confirmed by Ross Marcus ([id number]) on 03/23/2017 1:09:08 AM       Radiology Dg Chest Portable 1 View  Result Date: 03/23/2017 CLINICAL DATA:  Shortness of breath.  Cough. EXAM: PORTABLE CHEST 1 VIEW COMPARISON:  Radiograph 10/10/2016, and 06/28/2016. Chest CT 06/10/2016 FINDINGS: Post median sternotomy. Stable cardiomegaly. Persistent but improved ill-defined bilateral opacities, mid and lower lung zone distribution. Minimal fluid in the right minor fissure. Small pleural effusions, right greater than left. No confluent airspace disease. Multiple monitoring devices overlie the chest. IMPRESSION: Cardiomegaly. Ill-defined opacities in mid and lower lung zone distribution is consistent with pulmonary edema, improved from comparison exam 6 months prior. Small pleural effusions are also improved. Electronically Signed   By: Rubye Oaks M.D.   On: 03/23/2017 01:15    Procedures Procedures (including critical care time)  CRITICAL CARE Performed by: Shon Baton   Total critical care time: 40 minutes  Critical care time was exclusive of separately billable procedures and treating other patients.  Critical care was necessary to treat or  prevent imminent or life-threatening deterioration.  Critical care was time spent personally by me on the following activities: development of treatment plan with patient and/or surrogate as well as nursing, discussions with consultants, evaluation of patient's response to treatment, examination of patient, obtaining history from patient or surrogate, ordering and performing treatments and interventions, ordering and review of laboratory studies, ordering and review of radiographic studies, pulse oximetry and re-evaluation of patient's condition.  Medications Ordered in ED Medications  potassium chloride 10 mEq in 100 mL IVPB (not administered)  furosemide (LASIX) injection 40 mg (not administered)  albuterol (PROVENTIL) (2.5 MG/3ML) 0.083% nebulizer solution 5 mg (5 mg Nebulization Given 03/23/17 0051)     Initial Impression / Assessment and Plan / ED Course  I have reviewed the triage vital signs and the nursing notes.  Pertinent labs & imaging results that were available during my care of the patient were reviewed by me and considered in my medical decision making (see chart for details).     Patient presents with shortness of breath and chest tightness. History of heart failure. She appears in mild respiratory distress with tachypnea and increased work of breathing. She has crackles in the lower lobes. She overall does not appear significantly volume overloaded but reports increased tightness in the hands, abdomen, and eyes which she relates to increased volume. EKG is nonischemic. Chest x-ray shows pulmonary edema; however, improved from prior study. Prior study was during admission for diuresis and pulmonary edema. She does have mild hypokalemia at 2.9. On repeat evaluation, patient still has increased work of breathing. Some blood last shows respiratory alkalosis and hypoxia. Patient placed on BiPAP for comfort with improvement of her symptoms. She was given 40 mg of IV Lasix. Will admit for  diuresis. Feel this is her most likely diagnosis. PE was considered; however, patient has a contrast allergy and cannot obtain a CT scan at this time.  Final Clinical Impressions(s) / ED Diagnoses   Final diagnoses:  Acute on chronic diastolic congestive heart failure (HCC)  Hypokalemia  Acute respiratory failure with hypoxia [facility])    New Prescriptions New Prescriptions   No medications on file     Shon Baton, MD 03/23/17 [phone]

## 2017-03-24 ENCOUNTER — Other Ambulatory Visit: Payer: Self-pay | Admitting: Adult Health

## 2017-03-24 ENCOUNTER — Encounter: Payer: Self-pay | Admitting: *Deleted

## 2017-03-24 ENCOUNTER — Telehealth: Payer: Self-pay | Admitting: *Deleted

## 2017-03-24 DIAGNOSIS — R002 Palpitations: Secondary | ICD-10-CM

## 2017-03-24 DIAGNOSIS — I5033 Acute on chronic diastolic (congestive) heart failure: Secondary | ICD-10-CM

## 2017-03-24 DIAGNOSIS — I342 Nonrheumatic mitral (valve) stenosis: Secondary | ICD-10-CM | POA: Diagnosis not present

## 2017-03-24 LAB — HIV ANTIBODY (ROUTINE TESTING W REFLEX): HIV SCREEN 4TH GENERATION: NONREACTIVE

## 2017-03-24 LAB — BASIC METABOLIC PANEL
Anion gap: 4 — ABNORMAL LOW (ref 5–15)
BUN: 10 mg/dL (ref 6–20)
CHLORIDE: 107 mmol/L (ref 101–111)
CO2: 27 mmol/L (ref 22–32)
CREATININE: 0.82 mg/dL (ref 0.44–1.00)
Calcium: 8.6 mg/dL — ABNORMAL LOW (ref 8.9–10.3)
GFR calc Af Amer: >60 mL/min (ref >60)
GFR calc non Af Amer: >60 mL/min (ref >60)
Glucose, Bld: 108 mg/dL — ABNORMAL HIGH (ref 65–99)
Potassium: 4.2 mmol/L (ref 3.5–5.1)
Sodium: 138 mmol/L (ref 135–145)

## 2017-03-24 LAB — TSH: TSH: 2.145 u[IU]/mL (ref 0.350–4.500)

## 2017-03-24 MED ORDER — METOPROLOL SUCCINATE ER 25 MG PO TB24
12.5000 mg | ORAL_TABLET | Freq: Every day | ORAL | Status: DC
  Administered 2017-03-24: 12.5 mg via ORAL
  Filled 2017-03-24: qty 1

## 2017-03-24 MED ORDER — BUMETANIDE 1 MG PO TABS: 1.0000 mg | ORAL_TABLET | Freq: Two times a day (BID) | ORAL | 2 refills | Status: DC

## 2017-03-24 MED ORDER — BUMETANIDE 1 MG PO TABS: 1.0000 mg | ORAL_TABLET | Freq: Two times a day (BID) | ORAL | Status: DC

## 2017-03-24 MED ORDER — POTASSIUM CHLORIDE CRYS ER 20 MEQ PO TBCR: 20.0000 meq | EXTENDED_RELEASE_TABLET | Freq: Every day | ORAL | 2 refills | Status: DC

## 2017-03-24 MED ORDER — METOPROLOL SUCCINATE ER 25 MG PO TB24: 12.5000 mg | ORAL_TABLET | Freq: Every day | ORAL | 2 refills | Status: DC

## 2017-03-24 MED ORDER — METOPROLOL SUCCINATE ER 25 MG PO TB24: 12.5000 mg | ORAL_TABLET | Freq: Every day | ORAL | Status: DC

## 2017-03-24 NOTE — Telephone Encounter (Signed)
-----   Message from Antoine PocheJonathan F Branch, MD sent at 03/24/2017  3:50 PM EDT ----- Needs 21 day event monitor for palpitations, f/u 1 month with PA   Dominga FerryJ Branch MD

## 2017-03-24 NOTE — Progress Notes (Signed)
Patient is to be discharged home and in stable condition. Patient and family present for discharge teaching and both verbalize understanding. Patient walked out with staff present.  Quita SkyeMorgan P Dishmon, RN

## 2017-03-24 NOTE — Discharge Summary (Signed)
Physician Discharge Summary  Theresa Crawford BJY:[id number] DOB: May 08, 1981 DOA: 03/23/2017  PCP: Jerrell Belfast, [name]  Admit date: 03/23/2017 Discharge date: 03/24/2017  Time spent: 45 minutes  Recommendations for Outpatient Follow-up:  -Will be discharged home today. -Follow-up appointment will be arranged by cardiology and an event monitor will be mailed to her house.   Discharge Diagnoses:  Active Problems:   Acute CHF (congestive heart failure) (HCC)   Discharge Condition: Stable and improved  Filed Weights   03/23/17 0636 03/23/17 2039 03/24/17 0865  Weight: 106.6 kg (235 lb 0.2 oz) 106.1 kg (234 lb) 106.1 kg (233 lb 14.5 oz)    History of present illness:  As per Dr. Catha Gosselin on 7/11:  Theresa Crawford is a 36 y.o. female with a medical history of diastolic heart failure, mitral valve disease with repair, hypertension, who presented to the emergency department with complaints of shortness of breath. She states she had a cigarette around 10pm and was fine. She started having a sudden onset of shortness of breath and productive cough around 11:30pm. She does not recall having any other symptoms or chest pain with the shortness of breath. She denies any recent weight gain and has been taking Bumex and lasix as prescribed. She does complain of some "puffiness and tightness" in her hands and around her eyes. She denies and calf tenderness, recent immobilization or travel. She currently feels her breathing has improved since having BIPAP on and she received a breathing treatment in route to the hospital by EMS. Currently she denies any abdominal pain, nausea or vomiting, diarrhea constipation, changes in urinary or bowel pattern, recent illness or sick contacts.  ED Course: Found to be hypoxic, short of breath, placed on BiPAP with improvement and was given lasix IV 40mg  with potassium supplementation.  Chest x-ray showed some pulmonary vascular congestion however improved compared to 6  months prior. [facility] called for admission.  Hospital Course:   Dyspnea -Due to pulmonary edema and tearing due to severe mitral valve stenosis and acute on chronic diastolic CHF. -Cardiology believes there may be some tachy arrhythmia that may be causing an exacerbation of her mitral stenosis and diastolic CHF; because of this an event monitor will be arranged by them.  Hypertension -Blood pressures have been soft, Toprol-XL dose will be reduced to 12.5 mg daily, ARB will be held at this time.  Procedures:  None   Consultations:  Cardiology  Discharge Instructions  Discharge Instructions    Diet - low sodium heart healthy    Complete by:  As directed    Increase activity slowly    Complete by:  As directed      Allergies as of 03/24/2017      Reactions   Lisinopril Shortness Of Breath, Swelling   Throat swelling   Penicillins Shortness Of Breath, Swelling   Has patient had a PCN reaction causing immediate rash, facial/tongue/throat swelling, SOB or lightheadedness with hypotension: Yes Has patient had a PCN reaction causing severe rash involving mucus membranes or skin necrosis: Yes Has patient had a PCN reaction that required hospitalization No Has patient had a PCN reaction occurring within the last 10 years: No If all of the above answers are "NO", then may proceed with Cephalosporin use.   Bee Venom    Contrast Media [iodinated Diagnostic Agents]    difinity   Shellfish Allergy       Medication List    STOP taking these medications   amLODipine 5  MG tablet Commonly known as:  NORVASC   furosemide 40 MG tablet Commonly known as:  LASIX   losartan 25 MG tablet Commonly known as:  COZAAR     TAKE these medications   acetaminophen 650 MG CR tablet Commonly known as:  TYLENOL Take 650 mg by mouth every 8 (eight) hours as needed for pain.   bumetanide 1 MG tablet Commonly known as:  BUMEX Take 1 tablet (1 mg total) by mouth 2 (two) times daily.   citalopram  40 MG tablet Commonly known as:  CELEXA Take 40 mg by mouth 2 (two) times daily.   ferrous sulfate 325 (65 FE) MG tablet Take 325 mg by mouth 2 (two) times daily with a meal.   metoprolol succinate 25 MG 24 hr tablet Commonly known as:  TOPROL-XL Take 0.5 tablets (12.5 mg total) by mouth daily. What changed:  medication strength  how much to take  additional instructions   potassium chloride SA 20 MEQ tablet Commonly known as:  K-DUR,KLOR-CON Take 1 tablet (20 mEq total) by mouth daily.   QUEtiapine 25 MG tablet Commonly known as:  SEROQUEL Take 25 mg by mouth 2 (two) times daily.   STOOL SOFTENER 100 MG capsule Generic drug:  docusate sodium Take 100 mg by mouth daily.      Allergies  Allergen Reactions  . Lisinopril Shortness Of Breath and Swelling    Throat swelling  . Penicillins Shortness Of Breath and Swelling    Has patient had a PCN reaction causing immediate rash, facial/tongue/throat swelling, SOB or lightheadedness with hypotension: Yes Has patient had a PCN reaction causing severe rash involving mucus membranes or skin necrosis: Yes Has patient had a PCN reaction that required hospitalization No Has patient had a PCN reaction occurring within the last 10 years: No If all of the above answers are "NO", then may proceed with Cephalosporin use.   . Bee Venom   . Contrast Media [Iodinated Diagnostic Agents]     difinity  . Shellfish Allergy    Follow-up Information    Jodelle GrossLawrence, Kathryn M, NP Follow up on 04/25/2017.   Specialties:  Nurse Practitioner, Radiology, Cardiology Why:  1:30 pm Contact information: [address]  [place] [id number] [phone]            The results of significant diagnostics from this hospitalization (including imaging, microbiology, ancillary and laboratory) are listed below for reference.    Significant Diagnostic Studies: Dg Chest Portable 1 View  Result Date: 03/23/2017 CLINICAL DATA:  Shortness of breath.   Cough. EXAM: PORTABLE CHEST 1 VIEW COMPARISON:  Radiograph 10/10/2016, and 06/28/2016. Chest CT 06/10/2016 FINDINGS: Post median sternotomy. Stable cardiomegaly. Persistent but improved ill-defined bilateral opacities, mid and lower lung zone distribution. Minimal fluid in the right minor fissure. Small pleural effusions, right greater than left. No confluent airspace disease. Multiple monitoring devices overlie the chest. IMPRESSION: Cardiomegaly. Ill-defined opacities in mid and lower lung zone distribution is consistent with pulmonary edema, improved from comparison exam 6 months prior. Small pleural effusions are also improved. Electronically Signed   By: Rubye OaksMelanie  Ehinger M.D.   On: 03/23/2017 01:15    Microbiology: Recent Results (from the past 240 hour(s))  MRSA PCR Screening     Status: None   Collection Time: 03/23/17  6:55 AM  Result Value Ref Range Status   MRSA by PCR NEGATIVE NEGATIVE Final    Comment:        The GeneXpert MRSA Assay (FDA approved for NASAL  specimens only), is one component of a comprehensive MRSA colonization surveillance program. It is not intended to diagnose MRSA infection nor to guide or monitor treatment for MRSA infections.      Labs: Basic Metabolic Panel:  Recent Labs Lab 03/23/17 0138 03/23/17 0952 03/24/17 0743  NA 139  --  138  K 2.9*  --  4.2  CL 104  --  107  CO2 24  --  27  GLUCOSE 115*  --  108*  BUN 12  --  10  CREATININE 0.80  --  0.82  CALCIUM 9.1  --  8.6*  MG  --  2.1  --    Liver Function Tests: No results for input(s): AST, ALT, ALKPHOS, BILITOT, PROT, ALBUMIN in the last 168 hours. No results for input(s): LIPASE, AMYLASE in the last 168 hours. No results for input(s): AMMONIA in the last 168 hours. CBC:  Recent Labs Lab 03/23/17 0138  WBC 10.0  NEUTROABS 7.4  HGB 10.2*  HCT 31.7*  MCV 80.9  PLT 315   Cardiac Enzymes:  Recent Labs Lab 03/23/17 0138  TROPONINI <0.03   BNP: BNP (last 3  results)  Recent Labs  05/23/16 1752 10/10/16 0329 03/23/17 0138  BNP 642.0* 257.0* 214.0*    ProBNP (last 3 results) No results for input(s): PROBNP in the last 8760 hours.  CBG: No results for input(s): GLUCAP in the last 168 hours.     SignedChaya Jan  [facility] Pager: [phone] 03/24/2017, 2:02 PM

## 2017-03-24 NOTE — Progress Notes (Signed)
Progress Note  Patient Name: Theresa Crawford Date of Encounter: 03/24/2017   Subjective   SOB has resolved  Inpatient Medications    Scheduled Meds: . docusate sodium  100 mg Oral Daily  . enoxaparin (LOVENOX) injection  40 mg Subcutaneous Q24H  . ferrous sulfate  325 mg Oral BID WC  . furosemide  40 mg Intravenous Daily  . Living Better with Heart Failure Book   Does not apply Once  . nicotine  7 mg Transdermal Daily  . sodium chloride flush  3 mL Intravenous Q12H   Continuous Infusions: . sodium chloride     PRN Meds: sodium chloride, acetaminophen, albuterol, ondansetron (ZOFRAN) IV, sodium chloride flush   Vital Signs    Vitals:   03/23/17 2000 03/23/17 2039 03/24/17 0604 03/24/17 0608  BP: 114/65 (!) 105/44  (!) 106/51  Pulse: 71 71  79  Resp: (!) 25 20  20   Temp:  98.4 F (36.9 C)  98.1 F (36.7 C)  TempSrc:  Oral  Oral  SpO2: 99% 100% 98% 100%  Weight:  234 lb (106.1 kg)  233 lb 14.5 oz (106.1 kg)  Height:  5\' 4"  (1.626 m)      Intake/Output Summary (Last 24 hours) at 03/24/17 0816 Last data filed at 03/23/17 1900  Gross per 24 hour  Intake                0 ml  Output             2200 ml  Net            -2200 ml   Filed Weights   03/23/17 0636 03/23/17 2039 03/24/17 0608  Weight: 235 lb 0.2 oz (106.6 kg) 234 lb (106.1 kg) 233 lb 14.5 oz (106.1 kg)    Telemetry    NSR - Personally Reviewed  ECG     Physical Exam   GEN: No acute distress.   Neck: No JVD Cardiac: RRR, no murmurs, rubs, or gallops.  Respiratory: Clear to auscultation bilaterally. GI: Soft, nontender, non-distended  MS: No edema; No deformity. Neuro:  Nonfocal  Psych: Normal affect   Labs    Chemistry Recent Labs Lab 03/23/17 0138  NA 139  K 2.9*  CL 104  CO2 24  GLUCOSE 115*  BUN 12  CREATININE 0.80  CALCIUM 9.1  GFRNONAA >60  GFRAA >60  ANIONGAP 11     Hematology Recent Labs Lab 03/23/17 0138  WBC 10.0  RBC 3.92  HGB 10.2*  HCT 31.7*  MCV 80.9    MCH 26.0  MCHC 32.2  RDW 16.1*  PLT 315    Cardiac Enzymes Recent Labs Lab 03/23/17 0138  TROPONINI <0.03   No results for input(s): TROPIPOC in the last 168 hours.   BNP Recent Labs Lab 03/23/17 0138  BNP 214.0*     DDimer No results for input(s): DDIMER in the last 168 hours.   Radiology    Dg Chest Portable 1 View  Result Date: 03/23/2017 CLINICAL DATA:  Shortness of breath.  Cough. EXAM: PORTABLE CHEST 1 VIEW COMPARISON:  Radiograph 10/10/2016, and 06/28/2016. Chest CT 06/10/2016 FINDINGS: Post median sternotomy. Stable cardiomegaly. Persistent but improved ill-defined bilateral opacities, mid and lower lung zone distribution. Minimal fluid in the right minor fissure. Small pleural effusions, right greater than left. No confluent airspace disease. Multiple monitoring devices overlie the chest. IMPRESSION: Cardiomegaly. Ill-defined opacities in mid and lower lung zone distribution is consistent with pulmonary edema, improved from  comparison exam 6 months prior. Small pleural effusions are also improved. Electronically Signed   By: Rubye OaksMelanie  Ehinger M.D.   On: 03/23/2017 01:15    Cardiac Studies     Patient Profile      Theresa Crawford is a 36 y.o. female with a hx of mitral valve disease who is being seen today for the evaluation of CHF at the request of Dr Ardyth HarpsHernandez.  Assessment & Plan    1. Acute on chronic diastolic HF - complicated by post MV repair mitral stenosis and pulmonary HTN. - I/Os not complete, nightshift last night did not report. From available data negative 2.8 liters since admission - she is on lasix 40mg  IV daily. Labs are pending this morning. Last dose of IV lasix this AM, then change back to her home bumex - have patient ambulate with nursing, if does ok would be ok for discharge.    2. Palpitations - posssible that her palpitations correlate with a tachycarrhythmia, that could be exacerbating her diastolic HF and mitral stenosis - follow  tele this admit, will need home event monitor  3. HTN - soft bp's. Would not restart ARB, will start back low dose beta blocker with Toprol XL 12.5mg  daily. Follow bp's this AM after restarting Toprol    If ok with ambulation, stable labs, and vitals would be ok discharging later this morning/afternoon. She will need f/u in 2 weeks, need an outpatient 21 day event monitor arranged. Please notify us if patient for discharge today.   Joanie CoddingtonSigned, Branch, Jonathan, MD  03/24/2017, 8:16 AM

## 2017-03-28 ENCOUNTER — Emergency Department (HOSPITAL_COMMUNITY)
Admission: EM | Admit: 2017-03-28 | Discharge: 2017-03-28 | Disposition: A | Discharge status: Home / Self Care | Payer: Medicaid Other | Attending: Emergency Medicine | Admitting: Emergency Medicine

## 2017-03-28 ENCOUNTER — Encounter (HOSPITAL_COMMUNITY): Payer: Self-pay

## 2017-03-28 DIAGNOSIS — X509XXA Other and unspecified overexertion or strenuous movements or postures, initial encounter: Secondary | ICD-10-CM | POA: Diagnosis not present

## 2017-03-28 DIAGNOSIS — Y999 Unspecified external cause status: Secondary | ICD-10-CM | POA: Insufficient documentation

## 2017-03-28 DIAGNOSIS — S39012A Strain of muscle, fascia and tendon of lower back, initial encounter: Secondary | ICD-10-CM | POA: Diagnosis not present

## 2017-03-28 DIAGNOSIS — S3992XA Unspecified injury of lower back, initial encounter: Secondary | ICD-10-CM | POA: Diagnosis present

## 2017-03-28 DIAGNOSIS — I110 Hypertensive heart disease with heart failure: Secondary | ICD-10-CM | POA: Diagnosis not present

## 2017-03-28 DIAGNOSIS — Z79899 Other long term (current) drug therapy: Secondary | ICD-10-CM | POA: Diagnosis not present

## 2017-03-28 DIAGNOSIS — Y9389 Activity, other specified: Secondary | ICD-10-CM | POA: Insufficient documentation

## 2017-03-28 DIAGNOSIS — F17210 Nicotine dependence, cigarettes, uncomplicated: Secondary | ICD-10-CM | POA: Insufficient documentation

## 2017-03-28 DIAGNOSIS — I5033 Acute on chronic diastolic (congestive) heart failure: Secondary | ICD-10-CM | POA: Diagnosis not present

## 2017-03-28 DIAGNOSIS — Y929 Unspecified place or not applicable: Secondary | ICD-10-CM | POA: Insufficient documentation

## 2017-03-28 MED ORDER — MORPHINE SULFATE (PF) 4 MG/ML IV SOLN
8.0000 mg | Freq: Once | INTRAVENOUS | Status: AC
  Administered 2017-03-28: 8 mg via INTRAMUSCULAR
  Filled 2017-03-28: qty 2

## 2017-03-28 MED ORDER — ONDANSETRON 8 MG PO TBDP
8.0000 mg | ORAL_TABLET | Freq: Once | ORAL | Status: AC
  Administered 2017-03-28: 8 mg via ORAL
  Filled 2017-03-28: qty 1

## 2017-03-28 MED ORDER — TRAMADOL HCL 50 MG PO TABS: 50.0000 mg | ORAL_TABLET | Freq: Four times a day (QID) | ORAL | 0 refills | Status: DC | PRN

## 2017-03-28 MED ORDER — CYCLOBENZAPRINE HCL 10 MG PO TABS: 10.0000 mg | ORAL_TABLET | Freq: Three times a day (TID) | ORAL | 0 refills | Status: DC | PRN

## 2017-03-28 NOTE — ED Triage Notes (Signed)
Reports of bending over and when patient went to stand she felt an ache in back. Complains of back pain that radiates to right hip.

## 2017-03-28 NOTE — Discharge Instructions (Signed)
Apply ice packs on/off to your back.  Avoid bending over or twisting movements for at least one week.  Follow-up with your doctor for recheck if not improving

## 2017-03-31 ENCOUNTER — Ambulatory Visit (INDEPENDENT_AMBULATORY_CARE_PROVIDER_SITE_OTHER): Payer: Medicaid Other

## 2017-03-31 DIAGNOSIS — R002 Palpitations: Secondary | ICD-10-CM

## 2017-03-31 NOTE — ED Provider Notes (Signed)
[facility]-EM[facility]Y DEPT Provider Note   CSN: [id number] Arrival date & time: 03/28/17  1556     History   Chief Complaint Chief Complaint  Patient presents with  . Back Pain    HPI Theresa Crawford is a 36 y.o. female.  HPI  Theresa Crawford is a 36 y.o. female who presents to the Emergency Department complaining of low back pain of sudden onset that began shortly before ER arrival.  States that she was sitting crossed leg in the floor and leaned forward and tried to stand up and felt a sharp pain to her mid lower back.  Was unable to stand completely upright without pain.  States the pain is worse on the right side and radiates to her right buttock/hip.  States that she was recently admitted here for CHF, but denies symptoms related to that at present.  Denies chest pain, shortness of breath, pain, numbness or weakness to the lower extremities, urine or bowel changes. No home therapies tried prior to arrival.   Past Medical History:  Diagnosis Date  . CHF (congestive heart failure) (HCC)   . Congestive heart failure (HCC)   . History of open heart surgery   . Hypertension   . Mitral valve disease    mitral regurgitation    Patient Active Problem List   Diagnosis Date Noted  . Acute CHF (congestive heart failure) (HCC) 03/23/2017  . Acute pulmonary edema (HCC) 10/10/2016  . S/P mitral valve repair   . CAP (community acquired pneumonia) 05/25/2016  . Leukocytosis 05/24/2016  . Acute respiratory failure with hypoxia (HCC) 05/23/2016  . Acute on chronic diastolic CHF (congestive heart failure) (HCC) 05/23/2016  . Cough with hemoptysis 05/23/2016  . Postpartum complication pericarditis in 2009 with eventual needing MVR in 2015 05/23/2016  . Anemia, iron deficiency 05/23/2016  . Hypertension   . SOB (shortness of breath)   . Respiratory distress 02/16/2016  . Essential hypertension 02/16/2016  . Congestive heart failure (HCC) 11/26/2015    Past Surgical History:  Procedure  Laterality Date  . ABDOMINAL HYSTERECTOMY    . CESAREAN SECTION     X's 2  . MITRAL VALVE REPAIR  03/2013    [facility] in [place], [place]   . PARTIAL HYSTERECTOMY  2011   PID w/problem with fallopian tubes  . TEE WITHOUT CARDIOVERSION N/A 05/26/2016   Procedure: TRANSESOPHAGEAL ECHOCARDIOGRAM (TEE);  Surgeon: [name], MD;  Location: [facility] ENDO SUITE;  Service: Cardiovascular;  Laterality: N/A;    OB History    Gravida Para Term Preterm AB Living   2 2 2          SAB TAB Ectopic Multiple Live Births                   Home Medications    Prior to Admission medications   Medication Sig Start Date End Date Taking? Authorizing Provider  acetaminophen (TYLENOL) 650 MG CR tablet Take 650 mg by mouth every 8 (eight) hours as needed for pain.    [provider]  bumetanide (BUMEX) 1 MG tablet Take 1 tablet (1 mg total) by mouth 2 (two) times daily. 03/24/17   Philip Aspen, Limmie Patricia, MD  citalopram (CELEXA) 40 MG tablet Take 40 mg by mouth 2 (two) times daily.     [provider]  cyclobenzaprine (FLEXERIL) 10 MG tablet Take 1 tablet (10 mg total) by mouth 3 (three) times daily as needed. 03/28/17   Pauline Aus, PA-C  ferrous sulfate 325 (65 FE) MG tablet Take 325 mg by mouth 2 (two) times daily with a meal.    [provider]  metoprolol succinate (TOPROL-XL) 25 MG 24 hr tablet Take 0.5 tablets (12.5 mg total) by mouth daily. 03/25/17   Philip Aspen, Limmie Patricia, MD  potassium chloride SA (K-DUR,KLOR-CON) 20 MEQ tablet Take 1 tablet (20 mEq total) by mouth daily. 03/24/17   Philip Aspen, Limmie Patricia, MD  QUEtiapine (SEROQUEL) 25 MG tablet Take 25 mg by mouth 2 (two) times daily.    [provider]  STOOL SOFTENER 100 MG capsule Take 100 mg by mouth daily. 10/29/15   [provider]  traMADol (ULTRAM) 50 MG tablet Take 1 tablet (50 mg total) by mouth every 6 (six) hours as needed. 03/28/17   Pauline Aus, PA-C     Family History Family History  Problem Relation Age of Onset  . Heart failure Father        just received LVAD  . Heart disease Paternal Grandfather        stent placement    Social History Social History  Substance Use Topics  . Smoking status: Current Some Day Smoker    Packs/day: 0.25    Types: Cigarettes    Start date: 11/03/1999  . Smokeless tobacco: Never Used  . Alcohol use No     Allergies   Lisinopril; Penicillins; Bee venom; Contrast media [iodinated diagnostic agents]; and Shellfish allergy   Review of Systems Review of Systems  Constitutional: Negative for fever.  Respiratory: Negative for shortness of breath.   Cardiovascular: Negative for chest pain and leg swelling.  Gastrointestinal: Negative for abdominal pain, nausea and vomiting.  Genitourinary: Negative for decreased urine volume, difficulty urinating, dysuria and hematuria.  Musculoskeletal: Positive for back pain. Negative for joint swelling.  Skin: Negative for rash.  Neurological: Negative for weakness and numbness.  All other systems reviewed and are negative.    Physical Exam Updated Vital Signs Pulse 86   Temp 97.8 F (36.6 C) (Oral)   Resp 17   Ht 5\' 4"  (1.626 m)   Wt 105.7 kg (233 lb)   LMP 03/04/2017   SpO2 100%   BMI 39.99 kg/m   Physical Exam  Constitutional: She is oriented to person, place, and time. She appears well-developed and well-nourished. No distress.  HENT:  Head: Normocephalic and atraumatic.  Neck: Normal range of motion. Neck supple.  Cardiovascular: Normal rate, regular rhythm, normal heart sounds and intact distal pulses.   No murmur heard. Pulmonary/Chest: Effort normal and breath sounds normal. No respiratory distress.  Abdominal: Soft. She exhibits no distension. There is no tenderness.  Musculoskeletal: She exhibits tenderness. She exhibits no edema.       Lumbar back: She exhibits tenderness and pain. She exhibits normal range of motion, no  swelling, no deformity, no laceration and normal pulse.  ttp of the right paraspinal muscles and SI joint space. No spinal tenderness.  Pain reproduced with SLR on right at 30 degrees. Pt has 5/5 strength against resistance of bilateral lower extremities.     Neurological: She is alert and oriented to person, place, and time. She has normal strength. No sensory deficit. She exhibits normal muscle tone. Coordination and gait normal.  Reflex Scores:      Patellar reflexes are 2+ on the right side and 2+ on the left side.      Achilles reflexes are 2+ on the right side and 2+ on the left side. Skin:  Skin is warm and dry. No rash noted.  Nursing note and vitals reviewed.    ED Treatments / Results  Labs (all labs ordered are listed, but only abnormal results are displayed) Labs Reviewed - No data to display  EKG  EKG Interpretation None       Radiology    Procedures Procedures (including critical care time)  Medications Ordered in ED Medications  morphine 4 MG/ML injection 8 mg (8 mg Intramuscular Given 03/28/17 1736)  ondansetron (ZOFRAN-ODT) disintegrating tablet 8 mg (8 mg Oral Given 03/28/17 1737)     Initial Impression / Assessment and Plan / ED Course  I have reviewed the triage vital signs and the nursing notes.  Pertinent labs & imaging results that were available during my care of the patient were reviewed by me and considered in my medical decision making (see chart for details).      Pt is feeling better after medication and states she is ready for discharge.  No focal neuro deficits.  Likely muscular strain.  No concerning sx's for emergent neurological process.  Stable for d/c, return precautions discussed.   Final Clinical Impressions(s) / ED Diagnoses   Final diagnoses:  Strain of lumbar region, initial encounter    New Prescriptions Discharge Medication List as of 03/28/2017  6:20 PM    START taking these medications   Details  cyclobenzaprine  (FLEXERIL) 10 MG tablet Take 1 tablet (10 mg total) by mouth 3 (three) times daily as needed., Starting Mon 03/28/2017, Print    traMADol (ULTRAM) 50 MG tablet Take 1 tablet (50 mg total) by mouth every 6 (six) hours as needed., Starting Mon 03/28/2017, Print         Ridgevilleriplett, Earlingammy, PA-C 03/31/17 1256    Mancel BaleWentz, Elliott, MD 03/31/17 1720

## 2017-04-18 ENCOUNTER — Other Ambulatory Visit: Payer: Self-pay | Admitting: Adult Health

## 2017-04-18 MED ORDER — BUMETANIDE 1 MG PO TABS: 1.0000 mg | ORAL_TABLET | Freq: Two times a day (BID) | ORAL | 0 refills | Status: DC

## 2017-04-18 NOTE — Telephone Encounter (Signed)
Confirmed dose and directions for bumex 1 mg BID. Patient advised that a rx would be sent for bumex 1 mg by mouth BID #75/

## 2017-04-18 NOTE — Telephone Encounter (Signed)
Patient needs new RX for Bumex 1mg . Needs quantity to be 75. Please send to [facility]. / tg

## 2017-04-25 ENCOUNTER — Ambulatory Visit (INDEPENDENT_AMBULATORY_CARE_PROVIDER_SITE_OTHER): Payer: Medicaid Other | Admitting: Adult Health

## 2017-04-25 ENCOUNTER — Encounter: Payer: Self-pay | Admitting: Adult Health

## 2017-04-25 VITALS — BP 108/60 | HR 85 | Ht 64.0 in | Wt 229.6 lb

## 2017-04-25 DIAGNOSIS — R002 Palpitations: Secondary | ICD-10-CM

## 2017-04-25 MED ORDER — FUROSEMIDE 20 MG PO TABS: 20.0000 mg | ORAL_TABLET | Freq: Every day | ORAL | 3 refills | Status: DC

## 2017-04-25 MED ORDER — METOPROLOL SUCCINATE ER 25 MG PO TB24: 37.5000 mg | ORAL_TABLET | Freq: Every day | ORAL | 6 refills | Status: DC

## 2017-04-25 NOTE — Progress Notes (Signed)
Cardiology Office Note   Date:  04/25/2017   ID:  Theresa Crawford, DOB October 06, 1980, MRN [id number]  PCP:  Jerrell BelfastHouse, [name], [name]  Cardiologist:  Wyline MoodBranch Chief Complaint  Patient presents with  . Palpitations      History of Present Illness: Theresa Crawford is a 36 y.o. female who presents for ongoing assessment and management of acute on chronic diastolic heart failure, history of mitral valve repair in the setting of mitral valve stenosis, other history includes pulmonary hypertension, essential hypertension, and chronic palpitations. The patient was last seen by Dr. Wyline MoodBranch during recent hospitalization in July 2018 where she was admitted for decompensated CHF. She was taken off of amlodipine, furosemide 40 mg tablet and losartan. A cardiac event monitor was ordered.  Theresa Crawford comes today for discussion of her cardiac monitor. It is not been officially read by cardiologist. She did have 2 episodes of sinus tachycardia with heart rates up to 15 bpm, the patient triggered the device 47 times. Her heart rate was normal 99% of the time she had one episode of either 50 bpm 1% of the time, she had frequent PACs. Average heart rate appeared to be 88 bpm.  The patient denies any symptoms associated with rapid heart rhythm. She is also only taking Lasix 40 mg daily as opposed to twice a day. She denies any lower extremity edema and dizziness. Past Medical History:  Diagnosis Date  . CHF (congestive heart failure) (HCC)   . Congestive heart failure (HCC)   . History of open heart surgery   . Hypertension   . Mitral valve disease    mitral regurgitation    Past Surgical History:  Procedure Laterality Date  . ABDOMINAL HYSTERECTOMY    . CESAREAN SECTION     X's 2  . MITRAL VALVE REPAIR  03/2013    [facility] in [place]   . PARTIAL HYSTERECTOMY  2011   PID w/problem with fallopian tubes  . TEE WITHOUT CARDIOVERSION N/A 05/26/2016   Procedure: TRANSESOPHAGEAL ECHOCARDIOGRAM  (TEE);  Surgeon: Laqueta LindenSuresh A Koneswaran, MD;  Location: [facility] ENDO SUITE;  Service: Cardiovascular;  Laterality: N/A;     Current Outpatient Prescriptions  Medication Sig Dispense Refill  . acetaminophen (TYLENOL) 650 MG CR tablet Take 650 mg by mouth every 8 (eight) hours as needed for pain.    . bumetanide (BUMEX) 1 MG tablet Take 1 tablet (1 mg total) by mouth 2 (two) times daily. 75 tablet 0  . ferrous sulfate 325 (65 FE) MG tablet Take 325 mg by mouth 2 (two) times daily with a meal.    . metoprolol succinate (TOPROL-XL) 25 MG 24 hr tablet Take 1.5 tablets (37.5 mg total) by mouth daily. 45 tablet 6  . potassium chloride SA (K-DUR,KLOR-CON) 20 MEQ tablet Take 1 tablet (20 mEq total) by mouth daily. 30 tablet 2  . STOOL SOFTENER 100 MG capsule Take 100 mg by mouth daily.  3  . citalopram (CELEXA) 40 MG tablet Take 40 mg by mouth 2 (two) times daily.     . furosemide (LASIX) 20 MG tablet Take 1 tablet (20 mg total) by mouth daily. 90 tablet 3  . QUEtiapine (SEROQUEL) 25 MG tablet Take 25 mg by mouth 2 (two) times daily.     No current facility-administered medications for this visit.     Allergies:   Lisinopril; Penicillins; Bee venom; Contrast media [iodinated diagnostic agents]; and Shellfish allergy    Social History:  The patient  reports that she has been smoking Cigarettes.  She started smoking about 17 years ago. She has been smoking about 0.25 packs per day. She has never used smokeless tobacco. She reports that she does not drink alcohol or use drugs.   Family History:  The patient's family history includes Heart disease in her paternal grandfather; Heart failure in her father.    ROS: All other systems are reviewed and negative. Unless otherwise mentioned in H&P    PHYSICAL EXAM: VS:  BP 108/60   Pulse 85   Ht 5\' 4"  (1.626 m)   Wt 229 lb 9.6 oz (104.1 kg)   SpO2 97% Comment: on room air  BMI 39.41 kg/m  , BMI Body mass index is 39.41 kg/m. GEN: Well nourished, well  developed, in no acute distress Obese. HEENT: normal  Neck: no JVD, carotid bruits, or masses Cardiac: RRR; no murmurs, rubs, or gallops,no edema  Respiratory:  clear to auscultation bilaterally, normal work of breathing GI: soft, nontender, nondistended, + BS MS: no deformity or atrophy  Skin: warm and dry, no rash Neuro:  Strength and sensation are intact Psych: euthymic mood, full affect   Wt Readings from Last 3 Encounters:  04/25/17 229 lb 9.6 oz (104.1 kg)  03/28/17 233 lb (105.7 kg)  03/24/17 233 lb 14.5 oz (106.1 kg)      Other studies Reviewed: Echocardiogram 10-24-2016 Left ventricle: The cavity size was normal. Systolic function was   normal. The estimated ejection fraction was in the range of 55%   to 60%. The study is not technically sufficient to allow   evaluation of LV diastolic function due to MV repair. - Ventricular septum: These changes are consistent with a   post-thoracotomy state. - Mitral valve: Prior procedures included surgical repair. An   annular ring prosthesis was present. Leaflet separation was   mildly reduced. Mobility of the posterior leaflet was moderately   restricted. No evidence of vegetation. The findings are   consistent with moderate to severe stenosis. Increased flow with   mild to at most MR leading to increased flow and pressure   gradients. Clinical correlation recommended. There was mild to   moderate mitral regurgitation. Mean gradient (D): 12 mm Hg. Peak   gradient (D): 24 mm Hg. Valve area by continuity equation (using   LVOT flow): 0.88 cm^2. - Left atrium: The atrium was moderately dilated. - Right atrium: The atrium was mildly dilated. - Atrial septum: No defect or patent foramen ovale was identified. - Pulmonary arteries: PA peak pressure: 45 mm Hg (S).  ASSESSMENT AND PLAN:  1.  Tachycardia with complaints of palpitations: Unofficial review of her cardiac monitor revealed essentially normal sinus rhythm with one episode  of rapid heart rhythm around 150 bpm, average heart rate 88 bpm, frequent PACs were noted. The patient will have increase of metoprolol to 37.5 mg daily, I will decrease her Lasix to 20 mg daily from 40 mg daily.  2. Hypertension: Blood pressure soft today. Hence decrease in Lasix. She should only take extra doses as needed for lower extremity edema.  Current medicines are reviewed at length with the patient today.    Labs/ tests ordered today include: Bettey Mare. Liborio Nixon, ANP, AACC   04/25/2017 2:18 PM    [facility] [address] [place], [place] [address] Phone: [phone]; Fax: [phone]

## 2017-04-25 NOTE — Patient Instructions (Signed)
Medication Instructions:   Your physician has recommended you make the following change in your medication:   Decrease lasix to 20 mg daily.  Increase metoprolol succinate 25 mg to taking 1&1/2 tablets daily.   Continue all other medications the same.  Labwork:  NONE  Testing/Procedures:  NONE  Follow-Up:  Your physician recommends that you schedule a follow-up appointment in: 6 months. You will receive a reminder letter in the mail in about 4 months reminding you to call and schedule your appointment. If you don't receive this letter, please contact our office.  Any Other Special Instructions Will Be Listed Below (If Applicable).  If you need a refill on your cardiac medications before your next appointment, please call your pharmacy.

## 2017-05-03 ENCOUNTER — Emergency Department (HOSPITAL_COMMUNITY)
Admission: EM | Admit: 2017-05-03 | Discharge: 2017-05-04 | Disposition: A | Discharge status: Home / Self Care | Payer: Medicaid Other | Attending: Emergency Medicine | Admitting: Emergency Medicine

## 2017-05-03 ENCOUNTER — Emergency Department (HOSPITAL_COMMUNITY): Payer: Medicaid Other

## 2017-05-03 ENCOUNTER — Encounter (HOSPITAL_COMMUNITY): Payer: Self-pay

## 2017-05-03 DIAGNOSIS — F17210 Nicotine dependence, cigarettes, uncomplicated: Secondary | ICD-10-CM | POA: Diagnosis not present

## 2017-05-03 DIAGNOSIS — M436 Torticollis: Secondary | ICD-10-CM | POA: Diagnosis not present

## 2017-05-03 DIAGNOSIS — I110 Hypertensive heart disease with heart failure: Secondary | ICD-10-CM | POA: Insufficient documentation

## 2017-05-03 DIAGNOSIS — I509 Heart failure, unspecified: Secondary | ICD-10-CM | POA: Insufficient documentation

## 2017-05-03 DIAGNOSIS — M542 Cervicalgia: Secondary | ICD-10-CM | POA: Insufficient documentation

## 2017-05-03 DIAGNOSIS — Z79899 Other long term (current) drug therapy: Secondary | ICD-10-CM | POA: Insufficient documentation

## 2017-05-03 LAB — CBC WITH DIFFERENTIAL/PLATELET
BASOS PCT: 0 %
Basophils Absolute: 0 10*3/uL (ref 0.0–0.1)
EOS ABS: 0.1 10*3/uL (ref 0.0–0.7)
EOS PCT: 1 %
HCT: 33.1 % — ABNORMAL LOW (ref 36.0–46.0)
HEMOGLOBIN: 11.1 g/dL — AB (ref 12.0–15.0)
LYMPHS ABS: 2.3 10*3/uL (ref 0.7–4.0)
Lymphocytes Relative: 26 %
MCH: 26.9 pg (ref 26.0–34.0)
MCHC: 33.5 g/dL (ref 30.0–36.0)
MCV: 80.1 fL (ref 78.0–100.0)
MONOS PCT: 5 %
Monocytes Absolute: 0.5 10*3/uL (ref 0.1–1.0)
Neutro Abs: 6 10*3/uL (ref 1.7–7.7)
Neutrophils Relative %: 68 %
Platelets: 360 10*3/uL (ref 150–400)
RBC: 4.13 MIL/uL (ref 3.87–5.11)
RDW: 17 % — AB (ref 11.5–15.5)
WBC: 8.9 10*3/uL (ref 4.0–10.5)

## 2017-05-03 LAB — BASIC METABOLIC PANEL
Anion gap: 8 (ref 5–15)
BUN: 10 mg/dL (ref 6–20)
CALCIUM: 8.6 mg/dL — AB (ref 8.9–10.3)
CHLORIDE: 105 mmol/L (ref 101–111)
CO2: 24 mmol/L (ref 22–32)
CREATININE: 0.79 mg/dL (ref 0.44–1.00)
GFR calc non Af Amer: >60 mL/min (ref >60)
Glucose, Bld: 107 mg/dL — ABNORMAL HIGH (ref 65–99)
Potassium: 3.5 mmol/L (ref 3.5–5.1)
SODIUM: 137 mmol/L (ref 135–145)

## 2017-05-03 LAB — I-STAT BETA HCG BLOOD, ED (MC, WL, AP ONLY): I-stat hCG, quantitative: <5 m[IU]/mL (ref <5)

## 2017-05-03 MED ORDER — METHOCARBAMOL 500 MG PO TABS
500.0000 mg | ORAL_TABLET | Freq: Once | ORAL | Status: AC
  Administered 2017-05-03: 500 mg via ORAL
  Filled 2017-05-03: qty 1

## 2017-05-03 MED ORDER — MORPHINE SULFATE (PF) 4 MG/ML IV SOLN
4.0000 mg | Freq: Once | INTRAVENOUS | Status: AC
  Administered 2017-05-03: 4 mg via INTRAVENOUS
  Filled 2017-05-03: qty 1

## 2017-05-03 MED ORDER — KETOROLAC TROMETHAMINE 30 MG/ML IJ SOLN
30.0000 mg | Freq: Once | INTRAMUSCULAR | Status: AC
  Administered 2017-05-04: 30 mg via INTRAVENOUS
  Filled 2017-05-03: qty 1

## 2017-05-03 MED ORDER — DIPHENHYDRAMINE HCL 50 MG/ML IJ SOLN
50.0000 mg | Freq: Once | INTRAMUSCULAR | Status: AC
  Administered 2017-05-04: 50 mg via INTRAVENOUS
  Filled 2017-05-03: qty 1

## 2017-05-03 MED ORDER — HYDROCORTISONE NA SUCCINATE PF 100 MG IJ SOLR
200.0000 mg | Freq: Once | INTRAMUSCULAR | Status: AC
  Administered 2017-05-03: 200 mg via INTRAVENOUS
  Filled 2017-05-03: qty 4

## 2017-05-03 NOTE — ED Provider Notes (Signed)
[facility]PT Provider Note   CSN: [id number] Arrival date & time: 05/03/17  2047     History   Chief Complaint Chief Complaint  Patient presents with  . Neck Pain    HPI Theresa Crawford is a 36 y.o. female.  The history is provided by the patient.  Neck Pain   This is a new problem. The current episode started 2 days ago. The problem occurs constantly. The problem has been rapidly worsening. The pain is associated with nothing. There has been no fever. The pain is present in the left side. The quality of the pain is described as stabbing and shooting. The pain radiates to the left arm. The pain is severe. The symptoms are aggravated by twisting and position. The pain is the same all the time. Associated symptoms include tingling and weakness. Pertinent negatives include no photophobia, no visual change, no chest pain, no syncope, no bowel incontinence and no bladder incontinence. She has tried nothing for the symptoms. The treatment provided no relief.   36 year old female who presents with neck pain. She has a history of mitral valve regurgitation status post repair and hypertension. States that she felt mild neck discomfort over the past 2 days, but was unprovoked. This evening while cutting an onion she had sudden onset of severe left-sided neck pain that radiated down the left arm. It is associated with weakness of the left arm and tingling. Denies any vision or speech changes, confusion, fevers, trauma or fall, strain or neck manipulation.   Past Medical History:  Diagnosis Date  . CHF (congestive heart failure) (HCC)   . Congestive heart failure (HCC)   . History of open heart surgery   . Hypertension   . Mitral valve disease    mitral regurgitation    Patient Active Problem List   Diagnosis Date Noted  . Acute CHF (congestive heart failure) (HCC) 03/23/2017  . Acute pulmonary edema (HCC) 10/10/2016  . S/P mitral valve repair   . CAP (community acquired pneumonia)  05/25/2016  . Leukocytosis 05/24/2016  . Acute respiratory failure with hypoxia (HCC) 05/23/2016  . Acute on chronic diastolic CHF (congestive heart failure) (HCC) 05/23/2016  . Cough with hemoptysis 05/23/2016  . Postpartum complication pericarditis in 2009 with eventual needing MVR in 2015 05/23/2016  . Anemia, iron deficiency 05/23/2016  . Hypertension   . SOB (shortness of breath)   . Respiratory distress 02/16/2016  . Essential hypertension 02/16/2016  . Congestive heart failure (HCC) 11/26/2015    Past Surgical History:  Procedure Laterality Date  . ABDOMINAL HYSTERECTOMY    . CESAREAN SECTION     X's 2  . MITRAL VALVE REPAIR  03/2013    [facility] in [place], [place]   . PARTIAL HYSTERECTOMY  2011   PID w/problem with fallopian tubes  . TEE WITHOUT CARDIOVERSION N/A 05/26/2016   Procedure: TRANSESOPHAGEAL ECHOCARDIOGRAM (TEE);  Surgeon: [name], MD;  Location: [facility];  Service: Cardiovascular;  Laterality: N/A;    OB History    Gravida Para Term Preterm AB Living   2 2 2          SAB TAB Ectopic Multiple Live Births                   Home Medications    Prior to Admission medications   Medication Sig Start Date End Date Taking? Authorizing Provider  acetaminophen (TYLENOL) 650 MG CR tablet Take 650 mg by mouth every 8 (eight) hours  as needed for pain.   Yes [provider]  bumetanide (BUMEX) 1 MG tablet Take 1 tablet (1 mg total) by mouth 2 (two) times daily. 04/18/17  Yes [name], MD  citalopram (CELEXA) 40 MG tablet Take 40 mg by mouth at bedtime.    Yes [provider]  ferrous sulfate 325 (65 FE) MG tablet Take 325 mg by mouth 2 (two) times daily with a meal.   Yes [provider]  furosemide (LASIX) 20 MG tablet Take 1 tablet (20 mg total) by mouth daily. 04/25/17 07/24/17 Yes Jodelle Gross, NP  metoprolol succinate (TOPROL-XL) 25 MG 24 hr tablet Take 1.5 tablets (37.5 mg total) by mouth  daily. 04/25/17  Yes Jodelle Gross, NP  potassium chloride SA (K-DUR,KLOR-CON) 20 MEQ tablet Take 1 tablet (20 mEq total) by mouth daily. 03/24/17  Yes Philip Aspen, Limmie Patricia, MD  QUEtiapine (SEROQUEL) 25 MG tablet Take 25 mg by mouth at bedtime.    Yes [provider]  STOOL SOFTENER 100 MG capsule Take 100 mg by mouth daily. 10/29/15  Yes [provider]    Family History Family History  Problem Relation Age of Onset  . Heart failure Father        just received LVAD  . Heart disease Paternal Grandfather        stent placement    Social History Social History  Substance Use Topics  . Smoking status: Current Some Day Smoker    Packs/day: 0.25    Types: Cigarettes    Start date: 11/03/1999  . Smokeless tobacco: Never Used  . Alcohol use No     Allergies   Lisinopril; Penicillins; Bee venom; Contrast media [iodinated diagnostic agents]; and Shellfish allergy   Review of Systems Review of Systems  Constitutional: Negative for fever.  Eyes: Negative for photophobia.  Cardiovascular: Negative for chest pain and syncope.  Gastrointestinal: Negative for bowel incontinence.  Genitourinary: Negative for bladder incontinence.  Musculoskeletal: Positive for neck pain.  Neurological: Positive for tingling and weakness.  All other systems reviewed and are negative.    Physical Exam Updated Vital Signs BP (!) 101/58 (BP Location: Left Arm)   Pulse 79   Temp 98.4 F (36.9 C) (Oral)   Resp 14   Ht 5\' 4"  (1.626 m)   Wt 103.9 kg (229 lb)   LMP 04/25/2017   SpO2 100%   BMI 39.31 kg/m   Physical Exam Physical Exam  Nursing note and vitals reviewed. Constitutional: Well developed, well nourished, non-toxic, and in no acute distress Head: Normocephalic and atraumatic.  Mouth/Throat: Oropharynx is clear and moist.  Neck: With torticollis towards the left and tenderness over left lateral aspect of the neck. No overlying skin changes. Cardiovascular:  Normal rate and regular rhythm.  +2 radial pulses bilaterally Pulmonary/Chest: Effort normal and breath sounds normal.  Abdominal: Soft. There is no tenderness. There is no rebound and no guarding.  Musculoskeletal: Normal range of motion.  Neurological: Alert, no facial droop, fluent speech, EOMI, PERRL, normal strength in bilateral lower extremities. Equal bilateral hand grip. Tingling over left arm, but sensation in tact. Limited evaluation of left arm due to pain. Skin: Skin is warm and dry.  Psychiatric: Cooperative   ED Treatments / Results  Labs (all labs ordered are listed, but only abnormal results are displayed) Labs Reviewed  CBC WITH DIFFERENTIAL/PLATELET - Abnormal; Notable for the following:       Result Value   Hemoglobin 11.1 (*)  HCT 33.1 (*)    RDW 17.0 (*)    All other components within normal limits  BASIC METABOLIC PANEL - Abnormal; Notable for the following:    Glucose, Bld 107 (*)    Calcium 8.6 (*)    All other components within normal limits  I-STAT BETA HCG BLOOD, ED ([facility], [facility], [facility] ONLY)    EKG  EKG Interpretation None       Radiology Ct Angio Head W Or Wo Contrast  Result Date: 05/04/2017 CLINICAL DATA:  Under cleft headache. Left arm weakness and tingling. EXAM: CT ANGIOGRAPHY HEAD AND NECK TECHNIQUE: Multidetector CT imaging of the head and neck was performed using the standard protocol during bolus administration of intravenous contrast. Multiplanar CT image reconstructions and MIPs were obtained to evaluate the vascular anatomy. Carotid stenosis measurements (when applicable) are obtained utilizing NASCET criteria, using the distal internal carotid diameter as the denominator. CONTRAST:  100 mL Isovue 370 COMPARISON:  Head CT 04/26/2016 FINDINGS: CT HEAD FINDINGS Brain: No mass lesion, intraparenchymal hemorrhage or extra-axial collection. No evidence of acute cortical infarct. Brain parenchyma and CSF-containing spaces are normal for age. Vascular:  No hyperdense vessel or unexpected calcification. Skull: Normal visualized skull base, calvarium and extracranial soft tissues. Sinuses/Orbits: No sinus fluid levels or advanced mucosal thickening. No mastoid effusion. Normal orbits. Review of the MIP images confirms the above findings CTA NECK FINDINGS Aortic arch: There is no aneurysm or dissection of the visualized ascending aorta or aortic arch. Normal variant aortic arch branching pattern with the left vertebral artery arising independently from the aortic arch. The visualized proximal subclavian arteries are normal. Right carotid system: The right common carotid origin is widely patent. There is no common carotid or internal carotid artery dissection or aneurysm. No hemodynamically significant stenosis. Left carotid system: The left common carotid origin is widely patent. There is no common carotid or internal carotid artery dissection or aneurysm. No hemodynamically significant stenosis. Vertebral arteries: The vertebral system is left dominant. Both vertebral artery origins are normal. Both vertebral arteries are normal to their confluence with the basilar artery. Skeleton: There is no bony spinal canal stenosis. No lytic or blastic lesions. Other neck: The nasopharynx is clear. The oropharynx and hypopharynx are normal. The epiglottis is normal. The supraglottic larynx, glottis and subglottic larynx are normal. No retropharyngeal collection. The parapharyngeal spaces are preserved. The parotid and submandibular glands are normal. No sialolithiasis or salivary ductal dilatation. The thyroid gland is normal. There is no cervical lymphadenopathy. Upper chest: Moderate biapical pulmonary edema. Review of the MIP images confirms the above findings CTA HEAD FINDINGS Anterior circulation: --Intracranial internal carotid arteries: Normal. --Anterior cerebral arteries: Normal. --Middle cerebral arteries: Normal. --Posterior communicating arteries: Absent  bilaterally. Posterior circulation: --Posterior cerebral arteries: Normal. --Superior cerebellar arteries: Normal. --Basilar artery: Normal. --Anterior inferior cerebellar arteries: Normal. --Posterior inferior cerebellar arteries: Normal. Venous sinuses: As permitted by contrast timing, patent. Anatomic variants: None Delayed phase: No parenchymal contrast enhancement. Review of the MIP images confirms the above findings IMPRESSION: 1. Normal CTA of the head and neck. 2. Moderate biapical pulmonary edema. Electronically Signed   By: Deatra Robinson M.D.   On: 05/04/2017 02:57   Ct Angio Neck W And/or Wo Contrast  Result Date: 05/04/2017 CLINICAL DATA:  Under cleft headache. Left arm weakness and tingling. EXAM: CT ANGIOGRAPHY HEAD AND NECK TECHNIQUE: Multidetector CT imaging of the head and neck was performed using the standard protocol during bolus administration of intravenous contrast. Multiplanar CT image reconstructions and  MIPs were obtained to evaluate the vascular anatomy. Carotid stenosis measurements (when applicable) are obtained utilizing NASCET criteria, using the distal internal carotid diameter as the denominator. CONTRAST:  100 mL Isovue 370 COMPARISON:  Head CT 04/26/2016 FINDINGS: CT HEAD FINDINGS Brain: No mass lesion, intraparenchymal hemorrhage or extra-axial collection. No evidence of acute cortical infarct. Brain parenchyma and CSF-containing spaces are normal for age. Vascular: No hyperdense vessel or unexpected calcification. Skull: Normal visualized skull base, calvarium and extracranial soft tissues. Sinuses/Orbits: No sinus fluid levels or advanced mucosal thickening. No mastoid effusion. Normal orbits. Review of the MIP images confirms the above findings CTA NECK FINDINGS Aortic arch: There is no aneurysm or dissection of the visualized ascending aorta or aortic arch. Normal variant aortic arch branching pattern with the left vertebral artery arising independently from the aortic  arch. The visualized proximal subclavian arteries are normal. Right carotid system: The right common carotid origin is widely patent. There is no common carotid or internal carotid artery dissection or aneurysm. No hemodynamically significant stenosis. Left carotid system: The left common carotid origin is widely patent. There is no common carotid or internal carotid artery dissection or aneurysm. No hemodynamically significant stenosis. Vertebral arteries: The vertebral system is left dominant. Both vertebral artery origins are normal. Both vertebral arteries are normal to their confluence with the basilar artery. Skeleton: There is no bony spinal canal stenosis. No lytic or blastic lesions. Other neck: The nasopharynx is clear. The oropharynx and hypopharynx are normal. The epiglottis is normal. The supraglottic larynx, glottis and subglottic larynx are normal. No retropharyngeal collection. The parapharyngeal spaces are preserved. The parotid and submandibular glands are normal. No sialolithiasis or salivary ductal dilatation. The thyroid gland is normal. There is no cervical lymphadenopathy. Upper chest: Moderate biapical pulmonary edema. Review of the MIP images confirms the above findings CTA HEAD FINDINGS Anterior circulation: --Intracranial internal carotid arteries: Normal. --Anterior cerebral arteries: Normal. --Middle cerebral arteries: Normal. --Posterior communicating arteries: Absent bilaterally. Posterior circulation: --Posterior cerebral arteries: Normal. --Superior cerebellar arteries: Normal. --Basilar artery: Normal. --Anterior inferior cerebellar arteries: Normal. --Posterior inferior cerebellar arteries: Normal. Venous sinuses: As permitted by contrast timing, patent. Anatomic variants: None Delayed phase: No parenchymal contrast enhancement. Review of the MIP images confirms the above findings IMPRESSION: 1. Normal CTA of the head and neck. 2. Moderate biapical pulmonary edema. Electronically  Signed   By: Deatra Robinson M.D.   On: 05/04/2017 02:57    Procedures Procedures (including critical care time)  Medications Ordered in ED Medications  morphine 4 MG/ML injection 4 mg (4 mg Intravenous Given 05/03/17 2159)  methocarbamol (ROBAXIN) tablet 500 mg (500 mg Oral Given 05/03/17 2200)  hydrocortisone sodium succinate (SOLU-CORTEF) 100 MG injection 200 mg (200 mg Intravenous Given 05/03/17 2200)  diphenhydrAMINE (BENADRYL) injection 50 mg (50 mg Intravenous Given 05/04/17 0100)  ketorolac (TORADOL) 30 MG/ML injection 30 mg (30 mg Intravenous Given 05/04/17 0004)  ondansetron (ZOFRAN) injection 4 mg (4 mg Intravenous Given 05/04/17 0017)  iopamidol (ISOVUE-370) 76 % injection 100 mL (100 mLs Intravenous Contrast Given 05/04/17 0218)     Initial Impression / Assessment and Plan / ED Course  I have reviewed the triage vital signs and the nursing notes.  Pertinent labs & imaging results that were available during my care of the patient were reviewed by me and considered in my medical decision making (see chart for details).     36 year old female who presents with sudden onset of neck pain and torticollis over the left side. Initially  unable to range of motion his neck due to pain. She is afebrile, hemodynamically stable. Grossly neurologically intact, but her left extremity is limited due to pain. However handgrip is equal, sensation intact but does report tingling in the left upper arm.  Received analgesics and muscle relaxants, and on reevaluation has had gradual improvement in her symptoms, and moving her neck somewhat a little bit more. She will undergo CT angiogram of the neck and head to evaluate for arterial dissection , given the sudden onset nature of her symptoms that were unprovoked. If imaging studies are unremarkable, and symptoms improved I do feel that she will be stable for discharge home with supportive care management. I'm not concerned for acute stroke otherwise or spinal  cord process. Results are signed out to Dr. Bebe Shaggy.  Final Clinical Impressions(s) / ED Diagnoses   Final diagnoses:  Neck pain  Torticollis, acute    New Prescriptions Discharge Medication List as of 05/04/2017  3:14 AM       Lavera Guise, MD 05/04/17 1329

## 2017-05-03 NOTE — ED Notes (Signed)
2 unsuccessful attempts made to obtain blood work.

## 2017-05-03 NOTE — ED Triage Notes (Addendum)
Patient brought in by [facility] for neck pain that radiates down left arm. Patient reports of waking up this morning with a "crick in my neck". Patient states she was cutting onion this evening and pain exacerbated. Patient reports unable to move neck and increased pain when straightening arm. Reports of tingling in left fingers. Patient denies chest pain or shortness of breath.

## 2017-05-04 MED ORDER — IOPAMIDOL (ISOVUE-370) INJECTION 76%
100.0000 mL | Freq: Once | INTRAVENOUS | Status: AC | PRN
  Administered 2017-05-04: 100 mL via INTRAVENOUS

## 2017-05-04 MED ORDER — ONDANSETRON HCL 4 MG/2ML IJ SOLN
4.0000 mg | Freq: Once | INTRAMUSCULAR | Status: AC
  Administered 2017-05-04: 4 mg via INTRAVENOUS
  Filled 2017-05-04: qty 2

## 2017-05-04 NOTE — Discharge Instructions (Signed)
Your CT neck and head are reassuring.  Please follow-up with your primary care doctor.

## 2017-05-04 NOTE — ED Provider Notes (Signed)
Pt improved She has improved movement of neck She can fully use her left arm Will d/c home ?edema on CT, but no SOB reported and no crackles on exam and no hypoxia Sbp>100 on my exam   Zadie Rhine, MD 05/04/17 [phone]

## 2017-06-11 ENCOUNTER — Emergency Department (HOSPITAL_COMMUNITY): Payer: Medicaid Other

## 2017-06-11 ENCOUNTER — Inpatient Hospital Stay (HOSPITAL_COMMUNITY)
Admission: EM | Admit: 2017-06-11 | Discharge: 2017-06-13 | DRG: 291 | Disposition: A | Discharge status: Home / Self Care | Payer: Medicaid Other | Attending: Family Medicine | Admitting: Family Medicine

## 2017-06-11 ENCOUNTER — Inpatient Hospital Stay (HOSPITAL_COMMUNITY): Payer: Medicaid Other

## 2017-06-11 ENCOUNTER — Encounter (HOSPITAL_COMMUNITY): Payer: Self-pay

## 2017-06-11 DIAGNOSIS — O9089 Other complications of the puerperium, not elsewhere classified: Secondary | ICD-10-CM | POA: Diagnosis not present

## 2017-06-11 DIAGNOSIS — I10 Essential (primary) hypertension: Secondary | ICD-10-CM | POA: Diagnosis not present

## 2017-06-11 DIAGNOSIS — Z91041 Radiographic dye allergy status: Secondary | ICD-10-CM

## 2017-06-11 DIAGNOSIS — E876 Hypokalemia: Secondary | ICD-10-CM | POA: Diagnosis present

## 2017-06-11 DIAGNOSIS — I509 Heart failure, unspecified: Secondary | ICD-10-CM

## 2017-06-11 DIAGNOSIS — Z880 Allergy status to penicillin: Secondary | ICD-10-CM

## 2017-06-11 DIAGNOSIS — I459 Conduction disorder, unspecified: Secondary | ICD-10-CM | POA: Diagnosis present

## 2017-06-11 DIAGNOSIS — Z91030 Bee allergy status: Secondary | ICD-10-CM

## 2017-06-11 DIAGNOSIS — J810 Acute pulmonary edema: Secondary | ICD-10-CM | POA: Diagnosis present

## 2017-06-11 DIAGNOSIS — I272 Pulmonary hypertension, unspecified: Secondary | ICD-10-CM | POA: Diagnosis not present

## 2017-06-11 DIAGNOSIS — Z91013 Allergy to seafood: Secondary | ICD-10-CM | POA: Diagnosis not present

## 2017-06-11 DIAGNOSIS — F418 Other specified anxiety disorders: Secondary | ICD-10-CM | POA: Diagnosis present

## 2017-06-11 DIAGNOSIS — J9601 Acute respiratory failure with hypoxia: Secondary | ICD-10-CM | POA: Diagnosis present

## 2017-06-11 DIAGNOSIS — I5031 Acute diastolic (congestive) heart failure: Secondary | ICD-10-CM

## 2017-06-11 DIAGNOSIS — D509 Iron deficiency anemia, unspecified: Secondary | ICD-10-CM | POA: Diagnosis present

## 2017-06-11 DIAGNOSIS — I5033 Acute on chronic diastolic (congestive) heart failure: Secondary | ICD-10-CM | POA: Diagnosis present

## 2017-06-11 DIAGNOSIS — D508 Other iron deficiency anemias: Secondary | ICD-10-CM | POA: Diagnosis not present

## 2017-06-11 DIAGNOSIS — I050 Rheumatic mitral stenosis: Secondary | ICD-10-CM | POA: Diagnosis present

## 2017-06-11 DIAGNOSIS — I340 Nonrheumatic mitral (valve) insufficiency: Secondary | ICD-10-CM | POA: Diagnosis present

## 2017-06-11 DIAGNOSIS — Z888 Allergy status to other drugs, medicaments and biological substances status: Secondary | ICD-10-CM | POA: Diagnosis not present

## 2017-06-11 DIAGNOSIS — Z23 Encounter for immunization: Secondary | ICD-10-CM

## 2017-06-11 DIAGNOSIS — Z9889 Other specified postprocedural states: Secondary | ICD-10-CM | POA: Diagnosis not present

## 2017-06-11 DIAGNOSIS — F17210 Nicotine dependence, cigarettes, uncomplicated: Secondary | ICD-10-CM | POA: Diagnosis present

## 2017-06-11 DIAGNOSIS — I110 Hypertensive heart disease with heart failure: Principal | ICD-10-CM | POA: Diagnosis present

## 2017-06-11 DIAGNOSIS — I052 Rheumatic mitral stenosis with insufficiency: Secondary | ICD-10-CM | POA: Diagnosis present

## 2017-06-11 HISTORY — DX: Pulmonary hypertension, unspecified: I27.20

## 2017-06-11 LAB — ECHOCARDIOGRAM COMPLETE
AOVTI: 24 cm
AV Area VTI index: 1.52 cm2/m2
AV Area VTI: 3.3 cm2
AV Area mean vel: 3.56 cm2
AV pk vel: 115 cm/s
AVA: 3.35 cm2
AVAREAMEANVIN: 1.61 cm2/m2
AVCELMEANRAT: 1.13
AVG: 3 mmHg
AVLVOTPG: 6 mmHg
AVPG: 5 mmHg
Ao pk vel: 1.05 m/s
CHL CUP AV PEAK INDEX: 1.49
CHL CUP AV VEL: 3.35
CHL CUP LVOT MV VTI INDEX: 0.31 cm2/m2
CHL CUP LVOT MV VTI: 0.69
CHL CUP MV DEC (S): 560
CHL CUP MV M VEL: 161
CHL CUP RV SYS PRESS: 51 mmHg
CHL CUP TV REG PEAK VELOCITY: 345 cm/s
DOP CAL AO MEAN VELOCITY: 77.7 cm/s
EERAT: 22.79
EWDT: 560 ms
FS: 34 % (ref 28–44)
Height: 64 in
IVS/LV PW RATIO, ED: 1.02
LA ID, A-P, ES: 50 mm
LA diam end sys: 50 mm
LA diam index: 2.26 cm/m2
LA vol A4C: 72.9 ml
LAVOL: 74.5 mL
LAVOLIN: 33.7 mL/m2
LV E/e' medial: 22.79
LV PW d: 10 mm — AB (ref 0.6–1.1)
LV SIMPSON'S DISK: 61
LV TDI E'MEDIAL: 8.59
LV e' LATERAL: 10.4 cm/s
LVDIAVOL: 91 mL (ref 46–106)
LVDIAVOLIN: 41 mL/m2
LVEEAVG: 22.79
LVOT VTI: 25.6 cm
LVOT area: 3.14 cm2
LVOT diameter: 20 mm
LVOT peak VTI: 1.07 cm
LVOTPV: 121 cm/s
LVOTSV: 80 mL
LVSYSVOL: 35 mL
LVSYSVOLIN: 16 mL/m2
Lateral S' vel: 10.1 cm/s
MV pk A vel: 196 m/s
MV pk E vel: 237 m/s
MVANNULUSVTI: 116 cm
MVG: 15 mmHg
MVPG: 22 mmHg
Stroke v: 55 ml
TAPSE: 16 mm
TDI e' lateral: 10.4
TR max vel: 345 cm/s
Valve area index: 1.52
Weight: 3640.24 oz

## 2017-06-11 LAB — COMPREHENSIVE METABOLIC PANEL
ALK PHOS: 56 U/L (ref 38–126)
ALT: 13 U/L — AB (ref 14–54)
ANION GAP: 9 (ref 5–15)
AST: 21 U/L (ref 15–41)
Albumin: 3.9 g/dL (ref 3.5–5.0)
BILIRUBIN TOTAL: 0.7 mg/dL (ref 0.3–1.2)
BUN: 14 mg/dL (ref 6–20)
CALCIUM: 8.7 mg/dL — AB (ref 8.9–10.3)
CO2: 27 mmol/L (ref 22–32)
CREATININE: 0.92 mg/dL (ref 0.44–1.00)
Chloride: 104 mmol/L (ref 101–111)
Glucose, Bld: 129 mg/dL — ABNORMAL HIGH (ref 65–99)
Potassium: 3.3 mmol/L — ABNORMAL LOW (ref 3.5–5.1)
Sodium: 140 mmol/L (ref 135–145)
TOTAL PROTEIN: 7.6 g/dL (ref 6.5–8.1)

## 2017-06-11 LAB — BASIC METABOLIC PANEL
ANION GAP: 12 (ref 5–15)
BUN: 12 mg/dL (ref 6–20)
CHLORIDE: 104 mmol/L (ref 101–111)
CO2: 27 mmol/L (ref 22–32)
Calcium: 8.8 mg/dL — ABNORMAL LOW (ref 8.9–10.3)
Creatinine, Ser: 0.9 mg/dL (ref 0.44–1.00)
GFR calc Af Amer: >60 mL/min (ref >60)
GLUCOSE: 126 mg/dL — AB (ref 65–99)
POTASSIUM: 2.9 mmol/L — AB (ref 3.5–5.1)
Sodium: 143 mmol/L (ref 135–145)

## 2017-06-11 LAB — CBC WITH DIFFERENTIAL/PLATELET
Basophils Absolute: 0 10*3/uL (ref 0.0–0.1)
Basophils Relative: 0 %
EOS PCT: 2 %
Eosinophils Absolute: 0.2 10*3/uL (ref 0.0–0.7)
HCT: 32 % — ABNORMAL LOW (ref 36.0–46.0)
HEMOGLOBIN: 10.2 g/dL — AB (ref 12.0–15.0)
Lymphocytes Relative: 20 %
Lymphs Abs: 2 10*3/uL (ref 0.7–4.0)
MCH: 25.5 pg — AB (ref 26.0–34.0)
MCHC: 31.9 g/dL (ref 30.0–36.0)
MCV: 80 fL (ref 78.0–100.0)
MONOS PCT: 5 %
Monocytes Absolute: 0.5 10*3/uL (ref 0.1–1.0)
NEUTROS ABS: 7.6 10*3/uL (ref 1.7–7.7)
NEUTROS PCT: 73 %
Platelets: 314 10*3/uL (ref 150–400)
RBC: 4 MIL/uL (ref 3.87–5.11)
RDW: 16.5 % — ABNORMAL HIGH (ref 11.5–15.5)
WBC: 10.3 10*3/uL (ref 4.0–10.5)

## 2017-06-11 LAB — TROPONIN I
Troponin I: <0.03 ng/mL (ref <0.03)
Troponin I: <0.03 ng/mL (ref <0.03)
Troponin I: <0.03 ng/mL (ref <0.03)

## 2017-06-11 LAB — TSH: TSH: 2.139 u[IU]/mL (ref 0.350–4.500)

## 2017-06-11 LAB — BRAIN NATRIURETIC PEPTIDE: B NATRIURETIC PEPTIDE 5: 323 pg/mL — AB (ref 0.0–100.0)

## 2017-06-11 MED ORDER — POTASSIUM CHLORIDE CRYS ER 20 MEQ PO TBCR
30.0000 meq | EXTENDED_RELEASE_TABLET | Freq: Two times a day (BID) | ORAL | Status: DC
  Administered 2017-06-11 – 2017-06-13 (×5): 30 meq via ORAL
  Filled 2017-06-11 (×5): qty 1

## 2017-06-11 MED ORDER — FUROSEMIDE 10 MG/ML IJ SOLN
60.0000 mg | Freq: Once | INTRAMUSCULAR | Status: AC
  Administered 2017-06-11: 60 mg via INTRAVENOUS
  Filled 2017-06-11: qty 6

## 2017-06-11 MED ORDER — SODIUM CHLORIDE 0.9% FLUSH: 3.0000 mL | INTRAVENOUS | Status: DC | PRN

## 2017-06-11 MED ORDER — ASPIRIN EC 81 MG PO TBEC
81.0000 mg | DELAYED_RELEASE_TABLET | Freq: Every day | ORAL | Status: DC
  Administered 2017-06-11 – 2017-06-13 (×3): 81 mg via ORAL
  Filled 2017-06-11 (×3): qty 1

## 2017-06-11 MED ORDER — INFLUENZA VAC SPLIT QUAD 0.5 ML IM SUSY
0.5000 mL | PREFILLED_SYRINGE | INTRAMUSCULAR | Status: AC
  Administered 2017-06-12: 0.5 mL via INTRAMUSCULAR
  Filled 2017-06-11: qty 0.5

## 2017-06-11 MED ORDER — CITALOPRAM HYDROBROMIDE 20 MG PO TABS
40.0000 mg | ORAL_TABLET | Freq: Every day | ORAL | Status: DC
  Administered 2017-06-11 – 2017-06-13 (×3): 40 mg via ORAL
  Filled 2017-06-11 (×3): qty 2

## 2017-06-11 MED ORDER — ACETAMINOPHEN 325 MG PO TABS: 650.0000 mg | ORAL_TABLET | ORAL | Status: DC | PRN

## 2017-06-11 MED ORDER — FUROSEMIDE 10 MG/ML IJ SOLN
60.0000 mg | Freq: Two times a day (BID) | INTRAMUSCULAR | Status: DC
  Administered 2017-06-11 – 2017-06-12 (×3): 60 mg via INTRAVENOUS
  Filled 2017-06-11 (×3): qty 6

## 2017-06-11 MED ORDER — NITROGLYCERIN 2 % TD OINT
0.5000 [in_us] | TOPICAL_OINTMENT | Freq: Once | TRANSDERMAL | Status: AC
  Administered 2017-06-11: 0.5 [in_us] via TOPICAL
  Filled 2017-06-11: qty 1

## 2017-06-11 MED ORDER — NITROGLYCERIN 2 % TD OINT
1.0000 [in_us] | TOPICAL_OINTMENT | Freq: Once | TRANSDERMAL | Status: AC
  Administered 2017-06-11: 1 [in_us] via TOPICAL
  Filled 2017-06-11: qty 1

## 2017-06-11 MED ORDER — POTASSIUM CHLORIDE CRYS ER 20 MEQ PO TBCR: 20.0000 meq | EXTENDED_RELEASE_TABLET | Freq: Every day | ORAL | Status: DC

## 2017-06-11 MED ORDER — ONDANSETRON HCL 4 MG/2ML IJ SOLN: 4.0000 mg | Freq: Four times a day (QID) | INTRAMUSCULAR | Status: DC | PRN

## 2017-06-11 MED ORDER — SODIUM CHLORIDE 0.9 % IV SOLN: 250.0000 mL | INTRAVENOUS | Status: DC | PRN

## 2017-06-11 MED ORDER — METOPROLOL SUCCINATE ER 25 MG PO TB24
25.0000 mg | ORAL_TABLET | Freq: Every day | ORAL | Status: DC
  Administered 2017-06-13: 25 mg via ORAL
  Filled 2017-06-11: qty 1

## 2017-06-11 MED ORDER — ALBUTEROL SULFATE (2.5 MG/3ML) 0.083% IN NEBU
5.0000 mg | INHALATION_SOLUTION | Freq: Once | RESPIRATORY_TRACT | Status: AC
  Administered 2017-06-11: 5 mg via RESPIRATORY_TRACT
  Filled 2017-06-11: qty 6

## 2017-06-11 MED ORDER — QUETIAPINE FUMARATE 25 MG PO TABS
25.0000 mg | ORAL_TABLET | Freq: Every day | ORAL | Status: DC
  Administered 2017-06-11 – 2017-06-12 (×2): 25 mg via ORAL
  Filled 2017-06-11 (×3): qty 1

## 2017-06-11 MED ORDER — SODIUM CHLORIDE 0.9% FLUSH
3.0000 mL | Freq: Two times a day (BID) | INTRAVENOUS | Status: DC
  Administered 2017-06-11 – 2017-06-13 (×6): 3 mL via INTRAVENOUS

## 2017-06-11 NOTE — H&P (Signed)
History and Physical    Theresa Crawford:[id number] DOB: 01-02-1981 DOA: 06/11/2017  PCP: Jerrell Belfast, [name]  Patient coming from:  home  Chief Complaint:   sob  HPI: Theresa Crawford is a 36 y.o. female with medical history significant of peripartum pericarditis with subsequent MVR in 2014 (in [place]), diastolic chf, htn comes in with sudden onset of sob that awoke her in the middle of the night.  She noted some sob the last several days, no swelling.  Pt reports this is happening to her about every 3-4 months and always happens in the middle of the night.  She sees her cardiologist freqently and recently had KOH for about 21 days in august (she has not heard about any results from this).  She says her bp at home usually is soft with sbp in the high 90s.  Her cardiology team had decreased her bblocker but then recently increased it again.  She denies missing any doses of her bp medications.  She is on both bumex and lasix daily.  Does not skip any doses.  She denies any fevers.  No chest pain.  bipap was placed in the ED and she has been given ntg 1 inch paste along with lasix  iv and she has urinated several times and is already feeling much better on the bipap.  Denies any cough.  Her sbp on arrival was around 180.  Down to 90s now.  Pt referred for admission for flash pulm edema.   Review of Systems: As per HPI otherwise 10 point review of systems negative.   Past Medical History:  Diagnosis Date  . CHF (congestive heart failure) (HCC)   . Congestive heart failure (HCC)   . History of open heart surgery   . Hypertension   . Mitral valve disease    mitral regurgitation    Past Surgical History:  Procedure Laterality Date  . ABDOMINAL HYSTERECTOMY    . CESAREAN SECTION     X's 2  . MITRAL VALVE REPAIR  03/2013    [facility] in [place], [place]   . PARTIAL HYSTERECTOMY  2011   PID w/problem with fallopian tubes  . TEE WITHOUT CARDIOVERSION N/A 05/26/2016   Procedure: TRANSESOPHAGEAL ECHOCARDIOGRAM (TEE);  Surgeon: [name], MD;  Location: [facility];  Service: Cardiovascular;  Laterality: N/A;     reports that she has been smoking Cigarettes.  She started smoking about 17 years ago. She has been smoking about 0.25 packs per day. She has never used smokeless tobacco. She reports that she does not drink alcohol or use drugs.  Allergies  Allergen Reactions  . Lisinopril Shortness Of Breath and Swelling    Throat swelling  . Penicillins Shortness Of Breath and Swelling    Has patient had a PCN reaction causing immediate rash, facial/tongue/throat swelling, SOB or lightheadedness with hypotension: Yes Has patient had a PCN reaction causing severe rash involving mucus membranes or skin necrosis: Yes Has patient had a PCN reaction that required hospitalization No Has patient had a PCN reaction occurring within the last 10 years: No If all of the above answers are "NO", then may proceed with Cephalosporin use.   . Bee Venom   . Contrast Media [Iodinated Diagnostic Agents] Hives and Itching    Itching and hives to throat, neck, face and arms.   No difficulty breathing.   This was [facility] at [facility] approximately 2015  . Shellfish Allergy  Family History  Problem Relation Age of Onset  . Heart failure Father        just received LVAD  . Heart disease Paternal Grandfather        stent placement    Prior to Admission medications   Medication Sig Start Date End Date Taking? Authorizing Provider  acetaminophen (TYLENOL) 650 MG CR tablet Take 650 mg by mouth every 8 (eight) hours as needed for pain.    [provider]  bumetanide (BUMEX) 1 MG tablet Take 1 tablet (1 mg total) by mouth 2 (two) times daily. 04/18/17   [name], MD  citalopram (CELEXA) 40 MG tablet Take 40 mg by mouth at bedtime.     [provider]  ferrous sulfate 325 (65 FE) MG tablet Take 325 mg by mouth 2 (two) times  daily with a meal.    [provider]  furosemide (LASIX) 20 MG tablet Take 1 tablet (20 mg total) by mouth daily. 04/25/17 07/24/17  Jodelle Gross, NP  metoprolol succinate (TOPROL-XL) 25 MG 24 hr tablet Take 1.5 tablets (37.5 mg total) by mouth daily. 04/25/17   Jodelle Gross, NP  potassium chloride SA (K-DUR,KLOR-CON) 20 MEQ tablet Take 1 tablet (20 mEq total) by mouth daily. 03/24/17   Philip Aspen, Limmie Patricia, MD  QUEtiapine (SEROQUEL) 25 MG tablet Take 25 mg by mouth at bedtime.     [provider]  STOOL SOFTENER 100 MG capsule Take 100 mg by mouth daily. 10/29/15   [provider]    Physical Exam: Vitals:   06/11/17 0050 06/11/17 0332 06/11/17 0339 06/11/17 0411  BP:  (!) 95/52    Pulse:  92 86   Resp:  12 (!) 26   Temp:      TempSrc:      SpO2: 100% 100% 100%   Weight:    103.2 kg (227 lb 8.2 oz)  Height:     (1.626 m)      Constitutional: NAD, calm, comfortable Vitals:   06/11/17 0050 06/11/17 0332 06/11/17 0339 06/11/17 0411  BP:  (!) 95/52    Pulse:  92 86   Resp:  12 (!) 26   Temp:      TempSrc:      SpO2: 100% 100% 100%   Weight:    103.2 kg (227 lb 8.2 oz)  Height:     (1.626 m)   Eyes: PERRL, lids and conjunctivae normal ENMT: Mucous membranes are moist. Posterior pharynx clear of any exudate or lesions.Normal dentition.  Neck: normal, supple, no masses, no thyromegaly Respiratory: clear to auscultation bilaterally, no wheezing, no crackles. Normal respiratory effort. No accessory muscle use.  Cardiovascular: Regular rate and rhythm, no murmurs / rubs / gallops. No extremity edema. 2+ pedal pulses. No carotid bruits.  Abdomen: no tenderness, no masses palpated. No hepatosplenomegaly. Bowel sounds positive.  Musculoskeletal: no clubbing / cyanosis. No joint deformity upper and lower extremities. Good ROM, no contractures. Normal muscle tone.  Skin: no rashes, lesions, ulcers. No induration Neurologic: CN 2-12  grossly intact. Sensation intact, DTR normal. Strength 5/5 in all 4.  Psychiatric: Normal judgment and insight. Alert and oriented x 3. Normal mood.    Labs on Admission: I have personally reviewed following labs and imaging studies  CBC:  Recent Labs Lab 06/11/17 0100  WBC 10.3  NEUTROABS 7.6  HGB 10.2*  HCT 32.0*  MCV 80.0  PLT 314   Basic Metabolic Panel:  Recent Labs  Lab 06/11/17 0100  NA 140  K 3.3*  CL 104  CO2 27  GLUCOSE 129*  BUN 14  CREATININE 0.92  CALCIUM 8.7*   GFR: Estimated Creatinine Clearance: 97.9 mL/min (by C-G formula based on SCr of 0.92 mg/dL). Liver Function Tests:  Recent Labs Lab 06/11/17 0100  AST 21  ALT 13*  ALKPHOS 56  BILITOT 0.7  PROT 7.6  ALBUMIN 3.9   Cardiac Enzymes:  Recent Labs Lab 06/11/17 0100  TROPONINI <0.03    Radiological Exams on Admission: Dg Chest Portable 1 View  Result Date: 06/11/2017 CLINICAL DATA:  Respiratory distress.  Shortness of breath. EXAM: PORTABLE CHEST 1 VIEW COMPARISON:  03/23/2017 FINDINGS: Post median sternotomy with prosthetic valve. Mild cardiomegaly is unchanged from prior exam. Worsening pulmonary edema. Increasing pleural effusions with progressive fluid in the right minor fissure. No pneumothorax. IMPRESSION: Increasing pulmonary edema and pleural effusions with background cardiomegaly, consistent with CHF. Electronically Signed   By: Rubye Oaks M.D.   On: 06/11/2017 00:59    EKG: Independently reviewed. Mild sinus tach no acute issues Old chart reviewed Case discussed with dr knapp in ed Case discussed with ICU RN and RT in icu  Assessment/Plan 36 yo female with h/o MVR in 2014 come in with acute hypoxic resp failure due to chf exacerbation/flash pulm edema  Principal Problem:   Flash pulmonary edema (HCC)/CHF- cont bipap support thru the night.  Decrease inch ntg paste to 1/2 inch.  Cont toprol xl (she says she is only on 25 but chart says 37.5).  Consider putting her on  bid dosing.  She had a [facility] done in august to assess for any tachyarrthymia this was relatively normal freq pacs and one episode of 150bmp (cannot find formal read in EPIC).  Per cards note her bblocker was increased on 8/13 and her lasix was decreased...  Provide lasix 60 iv q 12 hour for the next 24 hours and decrease in the next day or so.  If still here Monday, consider consulting cardiology.  Can likely wean off bipap later this am.serial trop.  Will not repeat echo.    Active Problems:   Essential hypertension- as above   Acute on chronic diastolic CHF (congestive heart failure) (HCC)- as above   Postpartum complication pericarditis in 2009 with eventual needing MVR in 2015- noted   Med rec pending  DVT prophylaxis:  scds  Code Status:  full Family Communication:   none  Disposition Plan:  Per day team Consults called:  none Admission status:    admission   Derisha Funderburke A MD [facility]  If 7PM-7AM, please contact night-coverage [email or web address] Password [email or web address]  06/11/2017, 4:12 AM

## 2017-06-11 NOTE — ED Triage Notes (Signed)
Pt called ems for resp diff, has history of mitral valve repair in 2014.   Pt initally had chest pain but improved once ems placed her on cpap.  Pt denies pain at this time

## 2017-06-11 NOTE — Progress Notes (Signed)
Patient is a 36 year old woman with a history of peripartum pericarditis with subsequent mitral valve repair in 2014 (in [place]), chronic diastolic CHF, pulmonary HTN and PACs-noted on an event monitor 04/2017, admitted by Dr. Onalee Hua early this morning for acute shortness of breath/respiratory distress. In the ED, she was tachypnea, afebrile, and moderately hypertensive. She was oxygenating in the 95-98% range on room air. Her lab data were significant for a BNP of 323, normal troponin I, and chest x-ray that revealed pulmonary edema, pleural effusions, and cardiomegaly consistent with CHF. She was admitted for further evaluation and management.  Patient was briefly seen and examined. Her chart, vital signs, laboratory studies were reviewed. Agree with current management with additions below.  -We'll discontinue nitroglycerin paste. -We will increase potassium to 30 mEq twice a day and continue daily BMET. -We will order 2-D echo. Previous 2-D echo 09/2016 revealed an EF of 55-60%, moderate-severe myalgias after stenosis, S/P surgical repair of mitral valve, PA peak pressure of 45 mmHg. -Assess thyroid function with TSH. -Restart antidepressants. -Consult cardiology for 06/13/17.

## 2017-06-11 NOTE — Progress Notes (Signed)
*  PRELIMINARY RESULTS* Echocardiogram 2D Echocardiogram has been performed.  Stacey Drain 06/11/2017, 10:04 AM

## 2017-06-11 NOTE — ED Provider Notes (Signed)
[facility]-EM[facility]PT Provider Note   CSN: [id number] Arrival date & time: 06/11/17  0014  Time seen 12:20 AM pt seen on arrival   History   Chief Complaint Chief Complaint  Patient presents with  . Respiratory Distress   Level V caveat due to respiratory distress  HPI Theresa Crawford is a 36 y.o. female.  HPI  Patient reports she developed pericarditis post partum in 2009 and has had a mitral valve repair done in 2014. She has episodes of congestive heart failure flareups.she reports some mild dyspnea on exertion today however about hour ago she had acute onset of shortness of breath. She denies any extremity swelling but states her abdomen feels hard and swollen. She states she's noticed her eyes are puffy. She states she did have chest pain however she cannot tell me when it started or how long it lasted due to her respiratory distress. EMS was called and she arrived on their C Pap. While being transitioned to our BiPAP patient became more short of breath.  PCP House, Eugenio Hoes, [name] Cardiology Dr Darl Householder  Past Medical History:  Diagnosis Date  . CHF (congestive heart failure) (HCC)   . Congestive heart failure (HCC)   . History of open heart surgery   . Hypertension   . Mitral valve disease    mitral regurgitation    Patient Active Problem List   Diagnosis Date Noted  . Flash pulmonary edema (HCC) 06/11/2017  . Acute CHF (congestive heart failure) (HCC) 03/23/2017  . Acute pulmonary edema (HCC) 10/10/2016  . S/P mitral valve repair   . CAP (community acquired pneumonia) 05/25/2016  . Leukocytosis 05/24/2016  . Acute respiratory failure with hypoxia (HCC) 05/23/2016  . Acute on chronic diastolic CHF (congestive heart failure) (HCC) 05/23/2016  . Cough with hemoptysis 05/23/2016  . Postpartum complication pericarditis in 2009 with eventual needing MVR in 2015 05/23/2016  . Anemia, iron deficiency 05/23/2016  . Hypertension   . SOB (shortness of breath)   . Respiratory  distress 02/16/2016  . Essential hypertension 02/16/2016  . Congestive heart failure (HCC) 11/26/2015    Past Surgical History:  Procedure Laterality Date  . ABDOMINAL HYSTERECTOMY    . CESAREAN SECTION     X's 2  . MITRAL VALVE REPAIR  03/2013    [facility] in [place], [place]   . PARTIAL HYSTERECTOMY  2011   PID w/problem with fallopian tubes  . TEE WITHOUT CARDIOVERSION N/A 05/26/2016   Procedure: TRANSESOPHAGEAL ECHOCARDIOGRAM (TEE);  Surgeon: [name], MD;  Location: [facility];  Service: Cardiovascular;  Laterality: N/A;    OB History    Gravida Para Term Preterm AB Living   SAB TAB Ectopic Multiple Live Births                   Home Medications    Prior to Admission medications   Medication Sig Start Date End Date Taking? Authorizing Provider  acetaminophen (TYLENOL) 650 MG CR tablet Take 650 mg by mouth every 8 (eight) hours as needed for pain.    [provider]  bumetanide (BUMEX) 1 MG tablet Take 1 tablet (1 mg total) by mouth 2 (two) times daily. 04/18/17   [name], MD  citalopram (CELEXA) 40 MG tablet Take 40 mg by mouth at bedtime.     [provider]  ferrous sulfate 325 (65 FE) MG tablet Take 325 mg by mouth  2 (two) times daily with a meal.    [provider]  furosemide (LASIX) 20 MG tablet Take 1 tablet (20 mg total) by mouth daily. 04/25/17 07/24/17  Jodelle Gross, NP  metoprolol succinate (TOPROL-XL) 25 MG 24 hr tablet Take 1.5 tablets (37.5 mg total) by mouth daily. 04/25/17   Jodelle Gross, NP  potassium chloride SA (K-DUR,KLOR-CON) 20 MEQ tablet Take 1 tablet (20 mEq total) by mouth daily. 03/24/17   Philip Aspen, Limmie Patricia, MD  QUEtiapine (SEROQUEL) 25 MG tablet Take 25 mg by mouth at bedtime.     [provider]  STOOL SOFTENER 100 MG capsule Take 100 mg by mouth daily. 10/29/15   [provider]    Family History Family History  Problem  Relation Age of Onset  . Heart failure Father        just received LVAD  . Heart disease Paternal Grandfather        stent placement    Social History Social History  Substance Use Topics  . Smoking status: Current Some Day Smoker    Packs/day: 0.25    Types: Cigarettes    Start date: 11/03/1999  . Smokeless tobacco: Never Used  . Alcohol use No     Allergies   Lisinopril; Penicillins; Bee venom; Contrast media [iodinated diagnostic agents]; and Shellfish allergy   Review of Systems Review of Systems  Unable to perform ROS: Severe respiratory distress     Physical Exam Updated Vital Signs BP (!) 171/114   Pulse (!) 103   Temp 98.1 F (36.7 C) (Oral)   Resp (!) 32   Ht  (1.626 m)   Wt 104.3 kg (230 lb)   SpO2 97%   BMI 39.48 kg/m   Vital signs normal except for hypertension and tachycardia   Physical Exam  Constitutional: She is oriented to person, place, and time. She appears well-developed and well-nourished.  Non-toxic appearance. She does not appear ill. She appears distressed.  HENT:  Head: Normocephalic and atraumatic.  Right Ear: External ear normal.  Left Ear: External ear normal.  Nose: Nose normal. No mucosal edema or rhinorrhea.  Mouth/Throat: Oropharynx is clear and moist and mucous membranes are normal. No dental abscesses or uvula swelling.  Eyes: Pupils are equal, round, and reactive to light. Conjunctivae and EOM are normal.  Neck: Normal range of motion and full passive range of motion without pain. Neck supple.  Cardiovascular: Normal rate, regular rhythm and normal heart sounds.  Exam reveals no gallop and no friction rub.   No murmur heard. Pulmonary/Chest: Accessory muscle usage present. Tachypnea noted. She is in respiratory distress. She has decreased breath sounds. She has no wheezes. She has no rhonchi. She has rales. She exhibits no tenderness and no crepitus.  Abdominal: Soft. Normal appearance and bowel sounds are normal. She  exhibits no distension. There is no tenderness. There is no rebound and no guarding.  Musculoskeletal: Normal range of motion. She exhibits no edema or tenderness.  Moves all extremities well.   Neurological: She is alert and oriented to person, place, and time. She has normal strength. No cranial nerve deficit.  Skin: Skin is warm, dry and intact. No rash noted. No erythema. No pallor.  Psychiatric: She has a normal mood and affect. Her speech is normal and behavior is normal. Her mood appears not anxious.  Nursing note and vitals reviewed.    ED Treatments / Results  Labs (all labs ordered are listed, but only  abnormal results are displayed) Results for orders placed or performed during the hospital encounter of 06/11/17  CBC with Differential  Result Value Ref Range   WBC 10.3 4.0 - 10.5 K/uL   RBC 4.00 3.87 - 5.11 MIL/uL   Hemoglobin 10.2 (L) 12.0 - 15.0 g/dL   HCT 16.1 (L) 09.6 - 04.5 %   MCV 80.0 78.0 - 100.0 fL   MCH 25.5 (L) 26.0 - 34.0 pg   MCHC 31.9 30.0 - 36.0 g/dL   RDW 40.9 (H) 81.1 - 91.4 %   Platelets 314 150 - 400 K/uL   Neutrophils Relative % 73 %   Neutro Abs 7.6 1.7 - 7.7 K/uL   Lymphocytes Relative 20 %   Lymphs Abs 2.0 0.7 - 4.0 K/uL   Monocytes Relative 5 %   Monocytes Absolute 0.5 0.1 - 1.0 K/uL   Eosinophils Relative 2 %   Eosinophils Absolute 0.2 0.0 - 0.7 K/uL   Basophils Relative 0 %   Basophils Absolute 0.0 0.0 - 0.1 K/uL  Comprehensive metabolic panel  Result Value Ref Range   Sodium 140 135 - 145 mmol/L   Potassium 3.3 (L) 3.5 - 5.1 mmol/L   Chloride 104 101 - 111 mmol/L   CO2 27 22 - 32 mmol/L   Glucose, Bld 129 (H) 65 - 99 mg/dL   BUN 14 6 - 20 mg/dL   Creatinine, Ser 7.82 0.44 - 1.00 mg/dL   Calcium 8.7 (L) 8.9 - 10.3 mg/dL   Total Protein 7.6 6.5 - 8.1 g/dL   Albumin 3.9 3.5 - 5.0 g/dL   AST 21 15 - 41 U/L   ALT 13 (L) 14 - 54 U/L   Alkaline Phosphatase 56 38 - 126 U/L   Total Bilirubin 0.7 0.3 - 1.2 mg/dL   GFR calc non Af Amer >60  >60 mL/min   GFR calc Af Amer >60 >60 mL/min   Anion gap 9 5 - 15  Troponin I  Result Value Ref Range   Troponin I <0.03 <0.03 ng/mL  Brain natriuretic peptide  Result Value Ref Range   B Natriuretic Peptide 323.0 (H) 0.0 - 100.0 pg/mL   Laboratory interpretation all normal except mild anemia, mild hypokalemia, mildly elevated BNP    EKG  EKG Interpretation  Date/Time:  Saturday June 11 2017 00:22:45 EDT Ventricular Rate:  103 PR Interval:    QRS Duration: 146 QT Interval:  409 QTC Calculation: 536 R Axis:   58 Text Interpretation:  Sinus tachycardia Nonspecific intraventricular conduction delay Borderline repol abnormality, lateral leads Baseline wander in lead(s) V5 No significant change since last tracing 23 Mar 2017 Confirmed by Devoria Albe ([id number]) on 06/11/2017 12:30:49 AM       Radiology Dg Chest Portable 1 View  Result Date: 06/11/2017 CLINICAL DATA:  Respiratory distress.  Shortness of breath. EXAM: PORTABLE CHEST 1 VIEW COMPARISON:  03/23/2017 FINDINGS: Post median sternotomy with prosthetic valve. Mild cardiomegaly is unchanged from prior exam. Worsening pulmonary edema. Increasing pleural effusions with progressive fluid in the right minor fissure. No pneumothorax. IMPRESSION: Increasing pulmonary edema and pleural effusions with background cardiomegaly, consistent with CHF. Electronically Signed   By: Rubye Oaks M.D.   On: 06/11/2017 00:59    Procedures .Critical Care Performed by: Devoria Albe Authorized by: Devoria Albe   Critical care provider statement:    Critical care time (minutes):  45   Critical care time was exclusive of:  Separately billable procedures and treating other patients   Critical care  was necessary to treat or prevent imminent or life-threatening deterioration of the following conditions:  Cardiac failure, circulatory failure and respiratory failure   Critical care was time spent personally by me on the following activities:  Ordering  and review of laboratory studies, ordering and review of radiographic studies, pulse oximetry, re-evaluation of patient's condition, review of old charts, examination of patient, evaluation of patient's response to treatment, discussions with consultants and obtaining history from patient or surrogate   (including critical care time)  Medications Ordered in ED Medications  albuterol (PROVENTIL) (2.5 MG/3ML) 0.083% nebulizer solution 5 mg (5 mg Nebulization Given 06/11/17 0050)  furosemide (LASIX) injection 60 mg (60 mg Intravenous Given 06/11/17 0039)  nitroGLYCERIN (NITROGLYN) 2 % ointment 1 inch (1 inch Topical Given 06/11/17 0040)     Initial Impression / Assessment and Plan / ED Course  I have reviewed the triage vital signs and the nursing notes.  Pertinent labs & imaging results that were available during my care of the patient were reviewed by me and considered in my medical decision making (see chart for details).    Patient was placed on our BiPAP. She was given IV Lasix for diuresis and nitroglycerin paste was placed on her chest for congestive heart failure and to control her hypertension.  Recheck 12:48 AM patient indicates she's feeling better on the BiPAP. She has gotten her IV Lasix and her nitroglycerin paste. Her blood pressure now is 102/58, heart rate 86, pulse ox 100% with respiratory rate of 26.  Recheck at 01:30 AM Pt is on the bedside commode, states she is having some urinary output. She is still on Bipap. We discussed her test results and need for admission and she is agreeable.   02:29 AM Dr Onalee Hua will admit   Final Clinical Impressions(s) / ED Diagnoses   Final diagnoses:  Acute on chronic congestive heart failure, unspecified heart failure type [facility])    Plan admission  Devoria Albe, MD, Concha Pyo, MD 06/11/17 [phone]

## 2017-06-12 ENCOUNTER — Encounter (HOSPITAL_COMMUNITY): Payer: Self-pay | Admitting: Internal Medicine

## 2017-06-12 DIAGNOSIS — F418 Other specified anxiety disorders: Secondary | ICD-10-CM

## 2017-06-12 DIAGNOSIS — D508 Other iron deficiency anemias: Secondary | ICD-10-CM

## 2017-06-12 DIAGNOSIS — I272 Pulmonary hypertension, unspecified: Secondary | ICD-10-CM | POA: Diagnosis present

## 2017-06-12 DIAGNOSIS — E876 Hypokalemia: Secondary | ICD-10-CM

## 2017-06-12 HISTORY — DX: Pulmonary hypertension, unspecified: I27.20

## 2017-06-12 LAB — BASIC METABOLIC PANEL
Anion gap: 7 (ref 5–15)
BUN: 14 mg/dL (ref 6–20)
CALCIUM: 8.8 mg/dL — AB (ref 8.9–10.3)
CO2: 25 mmol/L (ref 22–32)
CREATININE: 0.79 mg/dL (ref 0.44–1.00)
Chloride: 108 mmol/L (ref 101–111)
GFR calc Af Amer: >60 mL/min (ref >60)
GLUCOSE: 99 mg/dL (ref 65–99)
Potassium: 3.7 mmol/L (ref 3.5–5.1)
SODIUM: 140 mmol/L (ref 135–145)

## 2017-06-12 LAB — MRSA PCR SCREENING: MRSA BY PCR: NEGATIVE

## 2017-06-12 LAB — CBC
HCT: 31.8 % — ABNORMAL LOW (ref 36.0–46.0)
Hemoglobin: 10.1 g/dL — ABNORMAL LOW (ref 12.0–15.0)
MCH: 25.3 pg — ABNORMAL LOW (ref 26.0–34.0)
MCHC: 31.8 g/dL (ref 30.0–36.0)
MCV: 79.7 fL (ref 78.0–100.0)
PLATELETS: 327 10*3/uL (ref 150–400)
RBC: 3.99 MIL/uL (ref 3.87–5.11)
RDW: 16.3 % — AB (ref 11.5–15.5)
WBC: 6.3 10*3/uL (ref 4.0–10.5)

## 2017-06-12 LAB — MAGNESIUM: MAGNESIUM: 2 mg/dL (ref 1.7–2.4)

## 2017-06-12 MED ORDER — FUROSEMIDE 10 MG/ML IJ SOLN
20.0000 mg | Freq: Two times a day (BID) | INTRAMUSCULAR | Status: DC
  Administered 2017-06-12 – 2017-06-13 (×2): 20 mg via INTRAVENOUS
  Filled 2017-06-12 (×2): qty 2

## 2017-06-12 MED ORDER — ENOXAPARIN SODIUM 40 MG/0.4ML ~~LOC~~ SOLN
40.0000 mg | SUBCUTANEOUS | Status: DC
  Administered 2017-06-12 – 2017-06-13 (×2): 40 mg via SUBCUTANEOUS
  Filled 2017-06-12 (×2): qty 0.4

## 2017-06-12 NOTE — Progress Notes (Signed)
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 In the ED, she was tachypneic, afebrile, and moderately hypertensive. She was oxygenating in the 95-98% range on room air. Her lab data were significant for a BNP of 323, normal troponin I, and chest x-ray that revealed pulmonary edema, pleural effusions, and cardiomegaly consistent with CHF. She was admitted for further evaluation and management.   Assessment & Plan:   Principal Problem:   Flash pulmonary edema (HCC) Active Problems:   Acute on chronic diastolic CHF (congestive heart failure) (HCC)   S/P mitral valve repair   Mitral valve stenosis, severe   Pulmonary hypertension (HCC)   Essential hypertension   Postpartum complication pericarditis in 2009 with eventual needing MVR in 2015   Anemia, iron deficiency   Depression with anxiety   Hypokalemia   1. Acute on chronic diastolic heart failure in the setting of severe mitral valve stenosis and pulmonary hypertension. Per previous echo, the patient does not have systolic dysfunction. She presented with acute CHF 03/2017 as well. Cardiology wondered at that time if tachyarrhythmias contributed to her decompensated heart failure. -Patient was given IV Lasix and nitroglycerin paste in the ED. Nitroglycerin paste was discontinued. -She was started on Lasix 60 mg IV every 12 hours. Toprol-XL was restarted. -Repeat echo ordered and revealed an EF of 60-65%; no regional wall motion abnormality; severe mitral valve stenosis; and pulmonary hypertension-51 mmHg. -Troponin I negative 3. TSH is within normal  limits. -I/O is -3.6 recorded as of today. Her weight recorded as 104.3 kg on admission is 100.2 kg today. -She is improving clinically and symptomatically, but probably needs more diuresis. -Due to soft blood pressures, will decrease the dose of IV Lasix to 20 mg every 12 hours. -We'll check follow-up CXR 10/1. [facility] consult cardiology tomorrow regarding recurrent CHF in the setting of severe MV stenosis.  Severe mitral valve stenosis in the setting of previous mitral valve repair. Status post mitral valve repair and severe mitral valve stenosis noted on the echo this admission. -The patient may need another repair and/or replacement. Will ask cardiology to weigh in.  History of tachycardia arrhythmia and PACs. Patient underwent evaluation with an event monitor for 21 days in 04/2017. Without an official reading, per cardiology office note, it was noted that she had 2 episodes of sinus tachycardia and PACs. -We'll continue Toprol-XL with caution due to soft blood pressures.  Hypokalemia. Patient was started on potassium chloride supplementation for a potassium level of 2.9. Her magnesium level was within normal limits. Her serum potassium has improved. -Continue twice a day dosing of potassium and daily BMETs.  Iron deficiency anemia. Her hemoglobin is stable.  Depression with anxiety. Currently stable on Celexa and Seroquel.        DVT prophylaxis: Lovenox Code Status: Full code Family Communication: Family not available Disposition Plan: Possible discharge to home in a couple of days   Consultants:   Cardiology, pending  Procedures:  2-D echo 06/11/17: Study Conclusions - Left ventricle: The cavity size was normal. Systolic function was   normal. The estimated ejection fraction was in the range of 60%   to 65%. Wall motion was  normal; there were no regional wall   motion abnormalities. - Aortic valve: Valve area (VTI): 3.35 cm^2. Valve area (Vmax): 3.3   cm^2. Valve  area (Vmean): 3.56 cm^2. - Mitral valve: The anterior MV is thickened with hockeystick   appearnce and some doming. Evidence of a prior repair noted with   no regurgitation but what appears to be severe stenosis present.   The submital structures appear moderately thickened. Prior   procedures included surgical repair. An annular ring prosthesis   was present. Mobility was mildly restricted. Transvalvular   velocity was increased. The findings are consistent with severe   stenosis. Valve area by continuity equation (using LVOT flow):   0.69 cm^2. - Left atrium: The atrium was moderately dilated. - Right atrium: The atrium was mildly dilated. - Tricuspid valve: There was mild-moderate regurgitation. - Pulmonary arteries: Systolic pressure was moderately increased. PA peak pressure: 51 mm Hg (S).   Antimicrobials:   None   Subjective: Patient says she is breathing much better. She denies chest pain.  Objective: Vitals:   06/12/17 0400 06/12/17 0500 06/12/17 0600 06/12/17 0700  BP: (!) 85/63 117/65 125/81 99/69  Pulse: 82 76 85 79  Resp: (!) 21 (!) 21 (!) 28 (!) 25  Temp:      TempSrc:      SpO2: 95% 91% 97% 98%  Weight:  100.2 kg (220 lb 14.4 oz)    Height:        Intake/Output Summary (Last 24 hours) at 06/12/17 0813 Last data filed at 06/12/17 0600  Gross per 24 hour  Intake             1046 ml  Output             4650 ml  Net            -3604 ml   Filed Weights   06/11/17 0024 06/11/17 0411 06/12/17 0500  Weight: 104.3 kg (230 lb) 103.2 kg (227 lb 8.2 oz) 100.2 kg (220 lb 14.4 oz)    Examination:  General exam: Appears calm and comfortable  Respiratory system: Mostly clear with rare crackles in the bases. Respiratory effort normal. Cardiovascular system: S1 & S2 heard ? S3 and 1 to 2/6 systolic murmur. No pedal edema. Gastrointestinal system: Abdomen is nondistended, soft and nontender. No organomegaly or masses felt. Normal bowel sounds heard. Central nervous  system: Alert and oriented. No focal neurological deficits. Extremities: Symmetric 5 x 5 power. Skin: No rashes, lesions or ulcers Psychiatry: Judgement and insight appear normal. Mood & affect appropriate.     Data Reviewed: I have personally reviewed following labs and imaging studies  CBC:  Recent Labs Lab 06/11/17 0100 06/12/17 0353  WBC 10.3 6.3  NEUTROABS 7.6  --   HGB 10.2* 10.1*  HCT 32.0* 31.8*  MCV 80.0 79.7  PLT 314 327   Basic Metabolic Panel:  Recent Labs Lab 06/11/17 0100 06/11/17 1004 06/12/17 0353  NA 140 143 140  K 3.3* 2.9* 3.7  CL 104 104 108  CO2 GLUCOSE 129* 126* 99  BUN CREATININE 0.92 0.90 0.79  CALCIUM 8.7* 8.8* 8.8*  MG  --   --  2.0   GFR: Estimated Creatinine Clearance: 110.8 mL/min (by C-G formula based on SCr of 0.79 mg/dL). Liver Function Tests:  Recent Labs Lab 06/11/17 0100  AST 21  ALT 13*  ALKPHOS 56  BILITOT 0.7  PROT 7.6  ALBUMIN 3.9  No results for input(s): LIPASE, AMYLASE in the last 168 hours. No results for input(s): AMMONIA in the last 168 hours. Coagulation Profile: No results for input(s): INR, PROTIME in the last 168 hours. Cardiac Enzymes:  Recent Labs Lab 06/11/17 0100 06/11/17 0433 06/11/17 1004 06/11/17 1621  TROPONINI <0.03 <0.03 <0.03 <0.03   BNP (last 3 results) No results for input(s): PROBNP in the last 8760 hours. HbA1C: No results for input(s): HGBA1C in the last 72 hours. CBG: No results for input(s): GLUCAP in the last 168 hours. Lipid Profile: No results for input(s): CHOL, HDL, LDLCALC, TRIG, CHOLHDL, LDLDIRECT in the last 72 hours. Thyroid Function Tests:  Recent Labs  06/11/17 1004  TSH 2.139   Anemia Panel: No results for input(s): VITAMINB12, FOLATE, FERRITIN, TIBC, IRON, RETICCTPCT in the last 72 hours. Sepsis Labs: No results for input(s): PROCALCITON, LATICACIDVEN in the last 168 hours.  No results found for this or any previous visit (from the  past 240 hour(s)).       Radiology Studies: Dg Chest Portable 1 View  Result Date: 06/11/2017 CLINICAL DATA:  Respiratory distress.  Shortness of breath. EXAM: PORTABLE CHEST 1 VIEW COMPARISON:  03/23/2017 FINDINGS: Post median sternotomy with prosthetic valve. Mild cardiomegaly is unchanged from prior exam. Worsening pulmonary edema. Increasing pleural effusions with progressive fluid in the right minor fissure. No pneumothorax. IMPRESSION: Increasing pulmonary edema and pleural effusions with background cardiomegaly, consistent with CHF. Electronically Signed   By: Rubye Oaks M.D.   On: 06/11/2017 00:59        Scheduled Meds: . aspirin EC  81 mg Oral Daily  . citalopram  40 mg Oral Daily  . enoxaparin (LOVENOX) injection  40 mg Subcutaneous Q24H  . furosemide  60 mg Intravenous Q12H  . Influenza vac split quadrivalent PF  0.5 mL Intramuscular Tomorrow-1000  . metoprolol succinate  25 mg Oral Daily  . potassium chloride SA  30 mEq Oral BID  . QUEtiapine  25 mg Oral QHS  . sodium chloride flush  3 mL Intravenous Q12H   Continuous Infusions: . sodium chloride       LOS: 1 day    Time spent: 35 minutes    Elliot Cousin, MD [facility] Pager [phone]  If 7PM-7AM, please contact night-coverage [email or web address] Password [email or web address] 06/12/2017, 8:13 AM

## 2017-06-13 ENCOUNTER — Inpatient Hospital Stay (HOSPITAL_COMMUNITY): Payer: Medicaid Other

## 2017-06-13 DIAGNOSIS — I050 Rheumatic mitral stenosis: Secondary | ICD-10-CM

## 2017-06-13 DIAGNOSIS — I5033 Acute on chronic diastolic (congestive) heart failure: Secondary | ICD-10-CM

## 2017-06-13 DIAGNOSIS — I10 Essential (primary) hypertension: Secondary | ICD-10-CM

## 2017-06-13 LAB — BASIC METABOLIC PANEL
ANION GAP: 8 (ref 5–15)
BUN: 14 mg/dL (ref 6–20)
CALCIUM: 8.9 mg/dL (ref 8.9–10.3)
CO2: 23 mmol/L (ref 22–32)
Chloride: 104 mmol/L (ref 101–111)
Creatinine, Ser: 0.77 mg/dL (ref 0.44–1.00)
GLUCOSE: 119 mg/dL — AB (ref 65–99)
Potassium: 3.8 mmol/L (ref 3.5–5.1)
SODIUM: 135 mmol/L (ref 135–145)

## 2017-06-13 MED ORDER — FUROSEMIDE 80 MG PO TABS: 40.0000 mg | ORAL_TABLET | Freq: Every day | ORAL | 0 refills | Status: DC

## 2017-06-13 MED ORDER — FUROSEMIDE 40 MG PO TABS
40.0000 mg | ORAL_TABLET | Freq: Every day | ORAL | Status: DC
  Administered 2017-06-13: 40 mg via ORAL
  Filled 2017-06-13: qty 1

## 2017-06-13 MED ORDER — METOPROLOL SUCCINATE ER 25 MG PO TB24: 25.0000 mg | ORAL_TABLET | Freq: Every day | ORAL | Status: DC

## 2017-06-13 NOTE — Progress Notes (Signed)
Pt discharged in stable condition via wheelchair into the care of her family via private vehicle.  PIV removed intact w/o S&S of complications.  Discharge instructions reviewed with pt.  Pt verbalized understanding.

## 2017-06-13 NOTE — Consult Note (Signed)
Cardiology Consultation:   Patient ID: DESIREA MIZRAHI; [id number]; 1981/07/15   Admit date: 06/11/2017 Date of Consult: 06/13/2017  Primary Care Provider: Jerrell Belfast, [name] Primary Cardiologist: Dina Rich MD    Patient Profile:   Theresa Crawford is a 36 y.o. female with a hx of chronic diastolic CHF, mitral valve repair (2014), with continued stenosis, hypertension, palpitations, who is being seen today for the evaluation of CHF with associated dyspnea, and respiratory distress,at the request of Dr.Fisher.   History of Present Illness:   Ms. Koch presented to the ER with complaints of dyspnea. She reports worsening dyspnea and cough over the last week prior to admission. She states that coughing was productive, described as pink and foamy, with chest pressure, causing her seek medical treatment.  On arrival to the emergency room she was found be hypertensive blood pressure 171/114, heart rate 103, she was tachypneic respirations of 32, she was afebrile. Pertinent labs revealed mild anemia with a hemoglobin of 10.2 hematocrit 32.0. Potassium was 3.3, glucose 129, creatinine 0.92. Troponin negative 1 and 0.03. EKG sinus tachycardia with intraventricular conduction delay. No evidence of acute ST T-wave. BNP elevated at 323.0 chest x-ray revealed pulmonary edema with pleural effusions, cardiomegaly.   The patient was treated with albuterol, Lasix 60 mg IV, and nitroglycerin paste. The patient is diuresed 4.6 L since admission, 1 L overnight on Lasix 20 mg IV every 12 hours.    Past Medical History:  Diagnosis Date  . CHF (congestive heart failure) (HCC)   . Congestive heart failure (HCC)   . History of open heart surgery   . Hypertension   . Mitral valve disease    mitral regurgitation  . Pulmonary hypertension (HCC) 06/12/2017    Past Surgical History:  Procedure Laterality Date  . ABDOMINAL HYSTERECTOMY    . CESAREAN SECTION     X's 2  . MITRAL VALVE REPAIR  03/2013   [facility] in [place], [place]   . PARTIAL HYSTERECTOMY  2011   PID w/problem with fallopian tubes  . TEE WITHOUT CARDIOVERSION N/A 05/26/2016   Procedure: TRANSESOPHAGEAL ECHOCARDIOGRAM (TEE);  Surgeon: [name], MD;  Location: [facility];  Service: Cardiovascular;  Laterality: N/A;     Home Medications:  Prior to Admission medications   Medication Sig Start Date End Date Taking? Authorizing Provider  acetaminophen (TYLENOL) 650 MG CR tablet Take 650 mg by mouth every 8 (eight) hours as needed for pain.   Yes [provider]  bumetanide (BUMEX) 1 MG tablet Take 1 tablet (1 mg total) by mouth 2 (two) times daily. 04/18/17  Yes [name], MD  citalopram (CELEXA) 40 MG tablet Take 40 mg by mouth at bedtime.    Yes [provider]  docusate sodium (COLACE) 100 MG capsule Take 100 mg by mouth daily.   Yes [provider]  ferrous sulfate 325 (65 FE) MG tablet Take 325 mg by mouth 2 (two) times daily with a meal.   Yes [provider]  furosemide (LASIX) 20 MG tablet Take 1 tablet (20 mg total) by mouth daily. 04/25/17 07/24/17 Yes Jodelle Gross, NP  metoprolol succinate (TOPROL-XL) 25 MG 24 hr tablet Take 1.5 tablets (37.5 mg total) by mouth daily. 04/25/17  Yes Jodelle Gross, NP  potassium chloride SA (K-DUR,KLOR-CON) 20 MEQ tablet Take 1 tablet (20 mEq total) by mouth daily. 03/24/17  Yes Philip Aspen, Limmie Patricia, MD  QUEtiapine (SEROQUEL) 25 MG tablet Take  25 mg by mouth at bedtime.    Yes [provider]    Inpatient Medications: Scheduled Meds: . aspirin EC  81 mg Oral Daily  . citalopram  40 mg Oral Daily  . enoxaparin (LOVENOX) injection  40 mg Subcutaneous Q24H  . furosemide  20 mg Intravenous Q12H  . metoprolol succinate  25 mg Oral Daily  . potassium chloride SA  30 mEq Oral BID  . QUEtiapine  25 mg Oral QHS  . sodium chloride flush  3 mL Intravenous Q12H   Continuous Infusions: . sodium  chloride     PRN Meds: sodium chloride, acetaminophen, ondansetron (ZOFRAN) IV, sodium chloride flush  Allergies:    Allergies  Allergen Reactions  . Lisinopril Shortness Of Breath and Swelling    Throat swelling  . Penicillins Shortness Of Breath and Swelling    Has patient had a PCN reaction causing immediate rash, facial/tongue/throat swelling, SOB or lightheadedness with hypotension: Yes Has patient had a PCN reaction causing severe rash involving mucus membranes or skin necrosis: Yes Has patient had a PCN reaction that required hospitalization No Has patient had a PCN reaction occurring within the last 10 years: No If all of the above answers are "NO", then may proceed with Cephalosporin use.   . Bee Venom   . Contrast Media [Iodinated Diagnostic Agents] Hives and Itching    Itching and hives to throat, neck, face and arms.   No difficulty breathing.   This was given at [facility] approximately 2015  . Shellfish Allergy     Social History:   Social History   Social History  . Marital status: Single    Spouse name: N/A  . Number of children: N/A  . Years of education: N/A   Occupational History  . Not on file.   Social History Main Topics  . Smoking status: Current Some Day Smoker    Packs/day: 0.25    Types: Cigarettes    Start date: 11/03/1999  . Smokeless tobacco: Never Used  . Alcohol use No  . Drug use: No  . Sexual activity: Not on file   Other Topics Concern  . Not on file   Social History Narrative  . No narrative on file    Family History:    I will leave it at that point wanted bleed LI can imagine Family History  Problem Relation Age of Onset  . Heart failure Father        just received LVAD  . Heart disease Paternal Grandfather        stent placement     ROS:  Please see the history of present illness.  ROS  All other ROS reviewed and negative.     Physical Exam/Data:   Vitals:   06/12/17 1000 06/12/17 1220 06/12/17  2048 06/13/17 0539  BP: 119/63 118/73 120/70 114/70  Pulse: 82 86 88 79  Resp: (!) Temp:  98.1 F (36.7 C) 98.6 F (37 C) 98.2 F (36.8 C)  TempSrc:  Oral Oral Oral  SpO2: 96% 100% 100% 100%  Weight:    231 lb 12.8 oz (105.1 kg)  Height:   (1.626 m)      Intake/Output Summary (Last 24 hours) at 06/13/17 0918 Last data filed at 06/12/17 1931  Gross per 24 hour  Intake              490 ml  Output  1700 ml  Net            -1210 ml   Filed Weights   06/11/17 0411 06/12/17 0500 06/13/17 0539  Weight: 227 lb 8.2 oz (103.2 kg) 220 lb 14.4 oz (100.2 kg) 231 lb 12.8 oz (105.1 kg)   Body mass index is 39.79 kg/m.  General:  Well nourished, well developed, in no acute distress HEENT: normal Lymph: no adenopathy Neck: no JVD Endocrine:  No thryomegaly Vascular: No carotid bruits; FA pulses 2+ bilaterally without bruits  Cardiac:  normal S1, S2; RRR; 1/6 systolic murmur but soft, no significant murmur at the apex or axillary area.  Lungs: Clear to auscultation bilaterally, no wheezing, rhonchi or rales  Abd: soft, nontender, no hepatomegaly  Ext: no edema Musculoskeletal:  No deformities, BUE and BLE strength normal and equal Skin: warm and dry  Neuro:  CNs 2-12 intact, no focal abnormalities noted Psych:  Normal affect   EKG:  The EKG was personally reviewed and demonstrates:  Sinus tachycardia with intraventricular conduction delay.   Telemetry:  Telemetry was personally reviewed and demonstrates:  SR, occasional PAC.Marland Kitchen s  Relevant CV Studies:  Echocardiogram 06/11/2017 Left ventricle: The cavity size was normal. Systolic function was   normal. The estimated ejection fraction was in the range of 60%   to 65%. Wall motion was normal; there were no regional wall   motion abnormalities. - Aortic valve: Valve area (VTI): 3.35 cm^2. Valve area (Vmax): 3.3   cm^2. Valve area (Vmean): 3.56 cm^2. - Mitral valve: The anterior MV is thickened with  hockeystick   appearnce and some doming. Evidence of a prior repair noted with   no regurgitation but what appears to be severe stenosis present.   The submital structures appear moderately thickened. Prior   procedures included surgical repair. An annular ring prosthesis   was present. Mobility was mildly restricted. Transvalvular   velocity was increased. The findings are consistent with severe   stenosis. Valve area by continuity equation (using LVOT flow):   0.69 cm^2. - Left atrium: The atrium was moderately dilated. - Right atrium: The atrium was mildly dilated. - Tricuspid valve: There was mild-moderate regurgitation. - Pulmonary arteries: Systolic pressure was moderately increased.   PA peak pressure: 51 mm Hg (S).   Laboratory Data:  Chemistry Recent Labs Lab 06/11/17 1004 06/12/17 0353 06/13/17 0627  NA 143 140 135  K 2.9* 3.7 3.8  CL 104 108 104  CO2 GLUCOSE 126* 99 119*  BUN CREATININE 0.90 0.79 0.77  CALCIUM 8.8* 8.8* 8.9  GFRNONAA >60 >60 >60  GFRAA >60 >60 >60  ANIONGAP Recent Labs Lab 06/11/17 0100  PROT 7.6  ALBUMIN 3.9  AST 21  ALT 13*  ALKPHOS 56  BILITOT 0.7   Hematology Recent Labs Lab 06/11/17 0100 06/12/17 0353  WBC 10.3 6.3  RBC 4.00 3.99  HGB 10.2* 10.1*  HCT 32.0* 31.8*  MCV 80.0 79.7  MCH 25.5* 25.3*  MCHC 31.9 31.8  RDW 16.5* 16.3*  PLT 314 327   Cardiac Enzymes Recent Labs Lab 06/11/17 0100 06/11/17 0433 06/11/17 1004 06/11/17 1621  TROPONINI <0.03 <0.03 <0.03 <0.03   No results for input(s): TROPIPOC in the last 168 hours.  BNP Recent Labs Lab 06/11/17 0100  BNP 323.0*      Radiology/Studies:  Dg Chest 2 View  Result Date: 06/13/2017 CLINICAL DATA:  Status post mitral valve repair  in 2014. The patient currently is experiencing respiratory difficulty. History of CHF, current smoker. EXAM: CHEST  2 VIEW COMPARISON:  Portable chest x-ray of June 11, 2017 FINDINGS: The lungs  are well-expanded. The interstitial markings are less conspicuous today. Fluid in the minor fissure has resolved. There is no significant pleural effusion. The cardiac silhouette is top-normal in size. The mitral valve ring is in reasonable position. The sternal wires are intact. The pulmonary vascularity is less engorged and more distinct. The bony thorax exhibits no acute abnormality. IMPRESSION: Improving CHF. Resolution of pleural effusions. No acute pneumonia. Electronically Signed   By: David  [place] M.D.   On: 06/13/2017 07:57   Dg Chest Portable 1 View  Result Date: 06/11/2017 CLINICAL DATA:  Respiratory distress.  Shortness of breath. EXAM: PORTABLE CHEST 1 VIEW COMPARISON:  03/23/2017 FINDINGS: Post median sternotomy with prosthetic valve. Mild cardiomegaly is unchanged from prior exam. Worsening pulmonary edema. Increasing pleural effusions with progressive fluid in the right minor fissure. No pneumothorax. IMPRESSION: Increasing pulmonary edema and pleural effusions with background cardiomegaly, consistent with CHF. Electronically Signed   By: Rubye Oaks M.D.   On: 06/11/2017 00:59    Assessment and Plan:   1. Acute on chronic diastolic CHF:  She has diuresed very well since admission and is symptomatically better. Remains on IV lasix 20 mg BID. Creatinine is 0.77. Wt is inaccurate based upon I/O and wt recordings being labile. On both bumex and lasix at home. May need to eliminate the Bumex and increase lasix to 40 mg daily.   2. Severe MV stenosis: Will discuss with Dr. Purvis Sheffield need to have her considered for repair again, due to ongoing symptoms and decompensated CHF recurrences. Heart rate control will be managed. She remains on metoprolol succinate 25 mg daily.   3. Hypertension: BP is much better controlled with diureses and medication management,    For questions or updates, please contact [facility] Please consult [email or web address] for contact info under  Cardiology/STEMI.   Signed, Joni Reining, NP  06/13/2017 9:18 AM   The patient was seen and examined, and I agree with the history, physical exam, assessment and plan as documented above, with modifications as noted below. I have also personally reviewed all relevant documentation, old records, labs, and both radiographic and cardiovascular studies. I have also independently interpreted old and new ECG's.  36 yr old woman well known to me from the outpatient setting.  I last saw her when she was hospitalized for the same clinical scenario on 10/12/16 (acute on chronic diastolic heart failure in the setting of mitral valve stenosis occurring after MV repair).  She was again hospitalized in July 2018. She subsequently wore an event monitor which did not demonstrate any significant tachyarrhythmias.  She has now diuresed nearly 5 L in last 48 hours.  Echocardiogram shows normal LV systolic function with severe mitral stenosis.  Chest xray today shows improvement of CHF.  Symptomatically, she is doing much better.  Recommendations: I will switch IV Lasix to oral 40 mg daily. Would discharge on this (no Bumex). Continue Toprol-XL 25 mg daily.  I will speak with CT surgery about their opinion regarding her valve and arrange for early outpatient follow up with me.  If she is able to ambulate without difficulty and maintain normal O2 sats, she can be discharged from my standpoint.   Prentice Docker, MD, [facility]  06/13/2017 10:27 AM

## 2017-06-13 NOTE — Discharge Summary (Signed)
Physician Discharge Summary  Theresa Crawford ZOX:[id number] DOB: [birth year] DOA: 06/11/2017  PCP: Jerrell Belfast, [name]  Admit date: 06/11/2017 Discharge date: 06/13/2017  Recommendations for Outpatient Follow-up:  1. Close follow-up has been arranged with cardiology  Follow-up Information    Jodelle Gross, NP Follow up on 07/01/2017.   Specialties:  Nurse Practitioner, Radiology, Cardiology Why:  1 pm. Please bring all medications with you to next appointment,  Contact information: [address] [place] [address] [phone]            Discharge Diagnoses:  1. Acute on chronic diastolic congestive heart failure 2. Severe mitral valve stenosis 3. Acute respiratory failure secondary to CHF 4. Essential hypertension  Discharge Condition: Improved Disposition: Home  Diet recommendation: Heart healthy  Filed Weights   06/11/17 0411 06/12/17 0500 06/13/17 0539  Weight: 103.2 kg (227 lb 8.2 oz) 100.2 kg (220 lb 14.4 oz) 105.1 kg (231 lb 12.8 oz)    History of present illness:  36yow PMH diastolic CHF, peripartum pericarditis with subsequent MVR 2014, presented with sudden SOB. Treated with BiPAP, NTG and diuretics in ED. Admitted for flash pulmonary edema, diuresis, BiPAP.  Hospital Course:  Patient was treated with aggressive diuresis and quickly weaned off BiPAP. She was seen by cardiology and medications were adjusted. She rapidly improved and her hospitalization was uncomplicated. Cardiology made recommendations as below in will coordinate close outpatient follow-up.  Acute on chronic diastolic CHF in setting of severe MV stenosis. Treated with diuretics. Troponins negative. -4.6 L since admission. -echo normal LVEF with severe mitral stenosis -CXR shows improvement in CHF -cardiology change to oral Lasix  daily and recs no Bumex on discharge. Continue Toprol-XL 25 mg dialy.  -cardiology will arrange outpatient f/u and d/w CT surgery   Acute resp failure  secondary to above, treated with BiPAP -Resolved with aggressive diuresis.  Essential HTN  Chronic normocytic anemia -stable, f/u as an outpatient   Consultants:  Cardiology   Procedures:  Echo Study Conclusions  - Left ventricle: The cavity size was normal. Systolic function was normal. The estimated ejection fraction was in the range of 60% to 65%. Wall motion was normal; there were no regional wall motion abnormalities. - Aortic valve: Valve area (VTI): 3.35 cm^2. Valve area (Vmax): 3.3 cm^2. Valve area (Vmean): 3.56 cm^2. - Mitral valve: The anterior MV is thickened with hockeystick appearnce and some doming. Evidence of a prior repair noted with no regurgitation but what appears to be severe stenosis present. The submital structures appear moderately thickened. Prior procedures included surgical repair. An annular ring prosthesis was present. Mobility was mildly restricted. Transvalvular velocity was increased. The findings are consistent with severe stenosis. Valve area by continuity equation (using LVOT flow): 0.69 cm^2. - Left atrium: The atrium was moderately dilated. - Right atrium: The atrium was mildly dilated. - Tricuspid valve: There was mild-moderate regurgitation. - Pulmonary arteries: Systolic pressure was moderately increased. PA peak pressure: 51 mm Hg (S).   Today's assessment: S: Feels well today. Breathing well. Ambulating without difficulty. O: Vitals: afebrile, 98.2, 18, 79, 114/70, 100% on RA   Constitutional. Appears calm, comfortable.  Cardiovascular. Regular rate and rhythm. No murmur, rub or gallop. No lower extremity edema.  Respiratory. Clear to auscultation bilaterally. No wheezes, rales or rhonchi. Normal respiratory effort.  Psychiatric. Grossly normal mood and affect. Speech fluent and appropriate.  Labs:  BMP unremarkable  Imaging studies:  CXR independently reviewed, improved, agree with  radiology interpretation, resolving CHF  Discharge Instructions  Discharge Instructions    (HEART FAILURE PATIENTS) Call MD:  Anytime you have any of the following symptoms: 1) 3 pound weight gain in 24 hours or 5 pounds in 1 week 2) shortness of breath, with or without a dry hacking cough 3) swelling in the hands, feet or stomach 4) if you have to sleep on extra pillows at night in order to breathe.    Complete by:  As directed    Diet - low sodium heart healthy    Complete by:  As directed    Discharge instructions    Complete by:  As directed    Call your physician or seek immediate medical attention for shortness of breath, swelling, pain or worsening of condition.   Increase activity slowly    Complete by:  As directed      Allergies as of 06/13/2017      Reactions   Lisinopril Shortness Of Breath, Swelling   Throat swelling   Penicillins Shortness Of Breath, Swelling   Has patient had a PCN reaction causing immediate rash, facial/tongue/throat swelling, SOB or lightheadedness with hypotension: Yes Has patient had a PCN reaction causing severe rash involving mucus membranes or skin necrosis: Yes Has patient had a PCN reaction that required hospitalization No Has patient had a PCN reaction occurring within the last 10 years: No If all of the above answers are "NO", then may proceed with Cephalosporin use.   Bee Venom    Contrast Media [iodinated Diagnostic Agents] Hives, Itching   Itching and hives to throat, neck, face and arms.   No difficulty breathing.   This was given at [facility] approximately 2015   Shellfish Allergy       Medication List    STOP taking these medications   bumetanide 1 MG tablet Commonly known as:  BUMEX     TAKE these medications   acetaminophen 650 MG CR tablet Commonly known as:  TYLENOL Take 650 mg by mouth every 8 (eight) hours as needed for pain.   citalopram 40 MG tablet Commonly known as:  CELEXA Take 40 mg by mouth at  bedtime.   docusate sodium 100 MG capsule Commonly known as:  COLACE Take 100 mg by mouth daily.   ferrous sulfate 325 (65 FE) MG tablet Take 325 mg by mouth 2 (two) times daily with a meal.   furosemide 80 MG tablet Commonly known as:  LASIX Take 0.5 tablets (40 mg total) by mouth daily. What changed:  medication strength  how much to take   metoprolol succinate 25 MG 24 hr tablet Commonly known as:  TOPROL-XL Take 1 tablet (25 mg total) by mouth daily. What changed:  how much to take   potassium chloride SA 20 MEQ tablet Commonly known as:  K-DUR,KLOR-CON Take 1 tablet (20 mEq total) by mouth daily.   QUEtiapine 25 MG tablet Commonly known as:  SEROQUEL Take 25 mg by mouth at bedtime.      Allergies  Allergen Reactions  . Lisinopril Shortness Of Breath and Swelling    Throat swelling  . Penicillins Shortness Of Breath and Swelling    Has patient had a PCN reaction causing immediate rash, facial/tongue/throat swelling, SOB or lightheadedness with hypotension: Yes Has patient had a PCN reaction causing severe rash involving mucus membranes or skin necrosis: Yes Has patient had a PCN reaction that required hospitalization No Has patient had a PCN reaction occurring within the last 10 years: No If all of  the above answers are "NO", then may proceed with Cephalosporin use.   . Bee Venom   . Contrast Media [Iodinated Diagnostic Agents] Hives and Itching    Itching and hives to throat, neck, face and arms.   No difficulty breathing.   This was given at [facility] approximately 2015  . Shellfish Allergy     The results of significant diagnostics from this hospitalization (including imaging, microbiology, ancillary and laboratory) are listed below for reference.    Significant Diagnostic Studies: Dg Chest 2 View  Result Date: 06/13/2017 CLINICAL DATA:  Status post mitral valve repair in 2014. The patient currently is experiencing respiratory difficulty.  History of CHF, current smoker. EXAM: CHEST  2 VIEW COMPARISON:  Portable chest x-ray of June 11, 2017 FINDINGS: The lungs are well-expanded. The interstitial markings are less conspicuous today. Fluid in the minor fissure has resolved. There is no significant pleural effusion. The cardiac silhouette is top-normal in size. The mitral valve ring is in reasonable position. The sternal wires are intact. The pulmonary vascularity is less engorged and more distinct. The bony thorax exhibits no acute abnormality. IMPRESSION: Improving CHF. Resolution of pleural effusions. No acute pneumonia. Electronically Signed   By: David  [place] M.D.   On: 06/13/2017 07:57   Dg Chest Portable 1 View  Result Date: 06/11/2017 CLINICAL DATA:  Respiratory distress.  Shortness of breath. EXAM: PORTABLE CHEST 1 VIEW COMPARISON:  03/23/2017 FINDINGS: Post median sternotomy with prosthetic valve. Mild cardiomegaly is unchanged from prior exam. Worsening pulmonary edema. Increasing pleural effusions with progressive fluid in the right minor fissure. No pneumothorax. IMPRESSION: Increasing pulmonary edema and pleural effusions with background cardiomegaly, consistent with CHF. Electronically Signed   By: Rubye Oaks M.D.   On: 06/11/2017 00:59    Microbiology: Recent Results (from the past 240 hour(s))  MRSA PCR Screening     Status: None   Collection Time: 06/12/17  3:00 AM  Result Value Ref Range Status   MRSA by PCR NEGATIVE NEGATIVE Final    Comment:        The GeneXpert MRSA Assay (FDA approved for NASAL specimens only), is one component of a comprehensive MRSA colonization surveillance program. It is not intended to diagnose MRSA infection nor to guide or monitor treatment for MRSA infections.      Labs: Basic Metabolic Panel:  Recent Labs Lab 06/11/17 0100 06/11/17 1004 06/12/17 0353 06/13/17 0627  NA 140 143 140 135  K 3.3* 2.9* 3.7 3.8  CL 104 104 108 104  CO2 GLUCOSE 129*  126* 99 119*  BUN CREATININE 0.92 0.90 0.79 0.77  CALCIUM 8.7* 8.8* 8.8* 8.9  MG  --   --  2.0  --    Liver Function Tests:  Recent Labs Lab 06/11/17 0100  AST 21  ALT 13*  ALKPHOS 56  BILITOT 0.7  PROT 7.6  ALBUMIN 3.9   CBC:  Recent Labs Lab 06/11/17 0100 06/12/17 0353  WBC 10.3 6.3  NEUTROABS 7.6  --   HGB 10.2* 10.1*  HCT 32.0* 31.8*  MCV 80.0 79.7  PLT 314 327   Cardiac Enzymes:  Recent Labs Lab 06/11/17 0100 06/11/17 0433 06/11/17 1004 06/11/17 1621  TROPONINI <0.03 <0.03 <0.03 <0.03     Recent Labs  10/10/16 0329 03/23/17 0138 06/11/17 0100  BNP 257.0* 214.0* 323.0*     Principal Problem:   Flash pulmonary edema (HCC) Active Problems:   Essential hypertension  Acute on chronic diastolic CHF (congestive heart failure) (HCC)   Postpartum complication pericarditis in 2009 with eventual needing MVR in 2015   Anemia, iron deficiency   S/P mitral valve repair   Depression with anxiety   Mitral valve stenosis, severe   Hypokalemia   Pulmonary hypertension (HCC)   Time coordinating discharge: 35 minutes  Signed:  Brendia Sacks, MD [facility] 06/13/2017, 1:21 PM

## 2017-06-13 NOTE — Care Management Note (Signed)
Case Management Note  Patient Details  Name: Theresa Crawford MRN: [id number] Date of Birth: 1981-08-31  Subjective/Objective:                  Admitted with CHF. Pt is from home, she's ind with ADL's. She has PCP and cardiologist. She has transportation and insurance with drug coverage. She has scale and reports compliance with diet.   Action/Plan: She has been weaned off oxygen and will DC home today with self care. Close card f/u.    Expected Discharge Date:  06/13/17               Expected Discharge Plan:  Home/Self Care  In-House Referral:  NA  Discharge planning Services  CM Consult  Post Acute Care Choice:  NA Choice offered to:  NA  Status of Service:  Completed, signed off  Malcolm Metro, RN 06/13/2017, 2:53 PM

## 2017-06-30 NOTE — Progress Notes (Deleted)
Cardiology Office Note   Date:  06/30/2017   ID:  Theresa DaneLatasha Y Crawford, DOB August 24, 1981, MRN [id number]  PCP:  Jerrell BelfastHouse, [name], [name]  Cardiologist:  Dina RichJonathan Branch, MD  No chief complaint on file.     History of Present Illness: Theresa Crawford is a 36 y.o. female who presents for post hospital follow up after being admitted for acute on chronic diastolic CHF, flash pulmonary edema,  and acute respiratory failure, with hx of MV repair with ongoing stenosis, and hypertension. She was treated with BiPAP and diureses. She was discharged on newly prescribed lasix and Bumex was discontinued. She was to follow up with CVTS for discussion of need to have mitral valve repair a second time. Discharge weight, 277 lbs.    Past Medical History:  Diagnosis Date  . CHF (congestive heart failure) (HCC)   . Congestive heart failure (HCC)   . History of open heart surgery   . Hypertension   . Mitral valve disease    mitral regurgitation  . Pulmonary hypertension (HCC) 06/12/2017    Past Surgical History:  Procedure Laterality Date  . ABDOMINAL HYSTERECTOMY    . CESAREAN SECTION     X's 2  . MITRAL VALVE REPAIR  03/2013    [facility] [place]   . PARTIAL HYSTERECTOMY  2011   PID w/problem with fallopian tubes  . TEE WITHOUT CARDIOVERSION N/A 05/26/2016   Procedure: TRANSESOPHAGEAL ECHOCARDIOGRAM (TEE);  Surgeon: Laqueta LindenSuresh A Koneswaran, MD;  Location: [facility];  Service: Cardiovascular;  Laterality: N/A;     Current Outpatient Prescriptions  Medication Sig Dispense Refill  . acetaminophen (TYLENOL) 650 MG CR tablet Take 650 mg by mouth every 8 (eight) hours as needed for pain.    . citalopram (CELEXA) 40 MG tablet Take 40 mg by mouth at bedtime.     . docusate sodium (COLACE) 100 MG capsule Take 100 mg by mouth daily.    . ferrous sulfate 325 (65 FE) MG tablet Take 325 mg by mouth 2 (two) times daily with a meal.    . furosemide (LASIX) 80 MG tablet Take 0.5 tablets (40 mg  total) by mouth daily. 30 tablet 0  . metoprolol succinate (TOPROL-XL) 25 MG 24 hr tablet Take 1 tablet (25 mg total) by mouth daily.    . potassium chloride SA (K-DUR,KLOR-CON) 20 MEQ tablet Take 1 tablet (20 mEq total) by mouth daily. 30 tablet 2  . QUEtiapine (SEROQUEL) 25 MG tablet Take 25 mg by mouth at bedtime.      No current facility-administered medications for this visit.     Allergies:   Lisinopril; Penicillins; Bee venom; Contrast media [iodinated diagnostic agents]; and Shellfish allergy    Social History:  The patient  reports that she has been smoking Cigarettes.  She started smoking about 17 years ago. She has been smoking about 0.25 packs per day. She has never used smokeless tobacco. She reports that she does not drink alcohol or use drugs.   Family History:  The patient's family history includes Heart disease in her paternal grandfather; Heart failure in her father.    ROS: All other systems are reviewed and negative. Unless otherwise mentioned in H&P    PHYSICAL EXAM: VS:  There were no vitals taken for this visit. , BMI There is no height or weight on file to calculate BMI. GEN: Well nourished, well developed, in no acute distress  HEENT: normal  Neck: no JVD, carotid bruits, or masses  Cardiac: ***RRR; no murmurs, rubs, or gallops,no edema  Respiratory:  clear to auscultation bilaterally, normal work of breathing GI: soft, nontender, nondistended, + BS MS: no deformity or atrophy  Skin: warm and dry, no rash Neuro:  Strength and sensation are intact Psych: euthymic mood, full affect   EKG:  EKG {ACTION; IS/IS ZOX:[id number]} ordered today. The ekg ordered today demonstrates ***   Recent Labs: 06/29/2017: ALT 13; B Natriuretic Peptide 323.0; TSH 2.139 06/12/2017: Hemoglobin 10.1; Magnesium 2.0; Platelets 327 06/13/2017: BUN 14; Creatinine, Ser 0.77; Potassium 3.8; Sodium 135    Lipid Panel No results found for: CHOL, TRIG, HDL, CHOLHDL, VLDL, LDLCALC,  LDLDIRECT    Wt Readings from Last 3 Encounters:  06/13/17 231 lb 12.8 oz (105.1 kg)  05/03/17 229 lb (103.9 kg)  04/25/17 229 lb 9.6 oz (104.1 kg)      Other studies Reviewed: Echocardiogram 06/29/17  Left ventricle: The cavity size was normal. Systolic function was normal. The estimated ejection fraction was in the range of 60% to 65%. Wall motion was normal; there were no regional wall motion abnormalities. - Aortic valve: Valve area (VTI): 3.35 cm^2. Valve area (Vmax): 3.3 cm^2. Valve area (Vmean): 3.56 cm^2. - Mitral valve: The anterior MV is thickened with hockeystick appearnce and some doming. Evidence of a prior repair noted with no regurgitation but what appears to be severe stenosis present. The submital structures appear moderately thickened. Prior procedures included surgical repair. An annular ring prosthesis was present. Mobility was mildly restricted. Transvalvular velocity was increased. The findings are consistent with severe stenosis. Valve area by continuity equation (using LVOT flow): 0.69 cm^2. - Left atrium: The atrium was moderately dilated. - Right atrium: The atrium was mildly dilated. - Tricuspid valve: There was mild-moderate regurgitation. - Pulmonary arteries: Systolic pressure was moderately increased. PA peak pressure: 51 mm Hg (S).  ASSESSMENT AND PLAN:  1.  ***   Current medicines are reviewed at length with the patient today.    Labs/ tests ordered today include: *** Bettey Mare. Liborio Nixon, ANP, AACC   06/30/2017 10:03 AM    [facility] [address] [place], [place] [address] Phone: [phone]; Fax: [phone]

## 2017-07-01 ENCOUNTER — Telehealth: Payer: Self-pay | Admitting: Adult Health

## 2017-07-01 ENCOUNTER — Ambulatory Visit: Payer: Medicaid Other | Admitting: Adult Health

## 2017-07-01 NOTE — Telephone Encounter (Signed)
Attempt to reach, LMTCB-cc 

## 2017-07-01 NOTE — Telephone Encounter (Signed)
Please call patient regarding medication concerns. / tg  °

## 2017-07-04 ENCOUNTER — Encounter: Payer: Self-pay | Admitting: Physician Assistant

## 2017-07-04 NOTE — Progress Notes (Deleted)
Cardiology Office Note    Date:  07/04/2017  ID:  Theresa Crawford, DOB Jul 02, 1981, MRN [id number] PCP:  Jerrell Belfast, [name]  Cardiologist:  Dr. Purvis Sheffield  Chief Complaint: f/u CHF  History of Present Illness:  Theresa Crawford is a 36 y.o. female with history of chronic diastolic CHF, reported postpartum pericarditis 2009, mitral valve repair (2014 [facility]) with continued stenosis, obesity, hypertension, pulmonary HTN, palpitations, anemia, PACs, PVCs who presents for post-hospital follow-up.  She appears to have also had a TEE 05/2016 quantifying mitral stenosis as mild-moderate. She was hospitalized in 10/12/16 for acute on chronic diastolic heart failure in the setting of mitral valve stenosis occurring after MV repair. She was seen in July 2018 for palpitations at which time event monitor showed NSR with occasional PACs, PVCs (correlating with symptoms).   Most recently was admitted 9/29-10/1/18 with acute on chronic diastolic CHF/pulmonary edema plus acute resp failure requiring bipap and Lasix. 2D Echo 06/11/17 showed EF 60-65%, no RWMA, "Mitral valve: The anterior MV is thickened with hockeystick appearnce and some doming. Evidence of a prior repair noted with no regurgitation but what appears to be severe stenosis present. The submital structures appear moderately thickened. Prior procedures included surgical repair. An annular ring prosthesis was present. Mobility was mildly restricted. Transvalvular velocity was increased. The findings are consistent with severe stenosis. Valve area by continuity equation (using LVOT flow): 0.69 cm^2." Other findings include hypokalemia (apparent in the past as well), normocytic anemia with Hgb of 10.1 similar to 03/2017, normal TSH, dc Cr 0.77, negative troponins. Cardiology team recommended Lasix 40mg  daily and no Bumex on discharge. DC weight was recorded as 231 although somewhat confusing as earlier hospital weights were 227->220.    chf  precautions  Chronic diastolic CHF Mitral stenosis Essential HTN PACs/PVCs    Past Medical History:  Diagnosis Date  . Chronic diastolic CHF (congestive heart failure) (HCC)   . History of open heart surgery   . Hypertension   . Mitral valve disease    mitral regurgitation  . Normocytic anemia   . Obesity   . Premature atrial contractions   . Pulmonary hypertension (HCC) 06/12/2017  . PVC's (premature ventricular contractions)    a. h/o palpitations with event monitor in 03/2017 showing NSR with PACs/PVCs.    Past Surgical History:  Procedure Laterality Date  . ABDOMINAL HYSTERECTOMY    . CESAREAN SECTION     X's 2  . MITRAL VALVE REPAIR  03/2013    [facility] in [place], [place]   . PARTIAL HYSTERECTOMY  2011   PID w/problem with fallopian tubes  . TEE WITHOUT CARDIOVERSION N/A 05/26/2016   Procedure: TRANSESOPHAGEAL ECHOCARDIOGRAM (TEE);  Surgeon: [name], MD;  Location: [facility];  Service: Cardiovascular;  Laterality: N/A;    Current Medications: No outpatient prescriptions have been marked as taking for the 07/05/17 encounter (Appointment) with Laurann Montana, PA-C.     Allergies:   Lisinopril; Penicillins; Bee venom; Contrast media [iodinated diagnostic agents]; and Shellfish allergy   Social History   Social History  . Marital status: Single    Spouse name: N/A  . Number of children: N/A  . Years of education: N/A   Social History Main Topics  . Smoking status: Current Some Day Smoker    Packs/day: 0.25    Types: Cigarettes    Start date: 11/03/1999  . Smokeless tobacco: Never Used  . Alcohol use No  . Drug use: No  .  Sexual activity: Not on file   Other Topics Concern  . Not on file   Social History Narrative  . No narrative on file     Family History:  Family History  Problem Relation Age of Onset  . Heart failure Father        just received LVAD  . Heart disease Paternal Grandfather        stent placement    ***  ROS:   Please see the history of present illness. Otherwise, review of systems is positive for ***.  All other systems are reviewed and otherwise negative.    PHYSICAL EXAM:   VS:  There were no vitals taken for this visit.  BMI: There is no height or weight on file to calculate BMI. GEN: Well nourished, well developed, in no acute distress  HEENT: normocephalic, atraumatic Neck: no JVD, carotid bruits, or masses Cardiac: ***RRR; no murmurs, rubs, or gallops, no edema  Respiratory:  clear to auscultation bilaterally, normal work of breathing GI: soft, nontender, nondistended, + BS MS: no deformity or atrophy  Skin: warm and dry, no rash Neuro:  Alert and Oriented x 3, Strength and sensation are intact, follows commands Psych: euthymic mood, full affect  Wt Readings from Last 3 Encounters:  06/13/17 231 lb 12.8 oz (105.1 kg)  05/03/17 229 lb (103.9 kg)  04/25/17 229 lb 9.6 oz (104.1 kg)      Studies/Labs Reviewed:   EKG:  EKG was ordered today and personally reviewed by me and demonstrates *** EKG was not ordered today.***  Recent Labs: 06/11/2017: ALT 13; B Natriuretic Peptide 323.0; TSH 2.139 06/12/2017: Hemoglobin 10.1; Magnesium 2.0; Platelets 327 06/13/2017: BUN 14; Creatinine, Ser 0.77; Potassium 3.8; Sodium 135   Lipid Panel No results found for: CHOL, TRIG, HDL, CHOLHDL, VLDL, LDLCALC, LDLDIRECT  Additional studies/ records that were reviewed today include: Summarized above.***    ASSESSMENT & PLAN:   1. ***  Disposition: F/u with ***   Medication Adjustments/Labs and Tests Ordered: Current medicines are reviewed at length with the patient today.  Concerns regarding medicines are outlined above. Medication changes, Labs and Tests ordered today are summarized above and listed in the Patient Instructions accessible in Encounters.   Signed, Laurann MontanaDayna N Justina Bertini, PA-C  07/04/2017 11:26 AM    [facility] [place] [facility] [address] [place], [place] [address] Ph: [phone]; Fax [phone]

## 2017-07-05 ENCOUNTER — Emergency Department (HOSPITAL_COMMUNITY): Payer: Medicaid Other

## 2017-07-05 ENCOUNTER — Inpatient Hospital Stay (HOSPITAL_COMMUNITY)
Admission: EM | Admit: 2017-07-05 | Discharge: 2017-07-06 | DRG: 291 | Disposition: A | Discharge status: Home / Self Care | Payer: Medicaid Other | Attending: Family Medicine | Admitting: Family Medicine

## 2017-07-05 ENCOUNTER — Encounter (HOSPITAL_COMMUNITY): Payer: Self-pay | Admitting: *Deleted

## 2017-07-05 ENCOUNTER — Ambulatory Visit: Payer: Medicaid Other | Admitting: Physician Assistant

## 2017-07-05 ENCOUNTER — Encounter: Payer: Self-pay | Admitting: Physician Assistant

## 2017-07-05 DIAGNOSIS — I272 Pulmonary hypertension, unspecified: Secondary | ICD-10-CM | POA: Diagnosis present

## 2017-07-05 DIAGNOSIS — Z90710 Acquired absence of both cervix and uterus: Secondary | ICD-10-CM | POA: Diagnosis not present

## 2017-07-05 DIAGNOSIS — Z8249 Family history of ischemic heart disease and other diseases of the circulatory system: Secondary | ICD-10-CM | POA: Diagnosis not present

## 2017-07-05 DIAGNOSIS — E669 Obesity, unspecified: Secondary | ICD-10-CM | POA: Diagnosis present

## 2017-07-05 DIAGNOSIS — Z9889 Other specified postprocedural states: Secondary | ICD-10-CM

## 2017-07-05 DIAGNOSIS — J189 Pneumonia, unspecified organism: Secondary | ICD-10-CM | POA: Diagnosis present

## 2017-07-05 DIAGNOSIS — Z23 Encounter for immunization: Secondary | ICD-10-CM

## 2017-07-05 DIAGNOSIS — F17210 Nicotine dependence, cigarettes, uncomplicated: Secondary | ICD-10-CM | POA: Diagnosis present

## 2017-07-05 DIAGNOSIS — Z91041 Radiographic dye allergy status: Secondary | ICD-10-CM | POA: Diagnosis not present

## 2017-07-05 DIAGNOSIS — Z880 Allergy status to penicillin: Secondary | ICD-10-CM | POA: Diagnosis not present

## 2017-07-05 DIAGNOSIS — J810 Acute pulmonary edema: Secondary | ICD-10-CM | POA: Diagnosis not present

## 2017-07-05 DIAGNOSIS — D649 Anemia, unspecified: Secondary | ICD-10-CM | POA: Diagnosis present

## 2017-07-05 DIAGNOSIS — R0603 Acute respiratory distress: Secondary | ICD-10-CM | POA: Diagnosis present

## 2017-07-05 DIAGNOSIS — Z888 Allergy status to other drugs, medicaments and biological substances status: Secondary | ICD-10-CM

## 2017-07-05 DIAGNOSIS — I10 Essential (primary) hypertension: Secondary | ICD-10-CM | POA: Diagnosis present

## 2017-07-05 DIAGNOSIS — I110 Hypertensive heart disease with heart failure: Secondary | ICD-10-CM | POA: Diagnosis present

## 2017-07-05 DIAGNOSIS — Z91013 Allergy to seafood: Secondary | ICD-10-CM

## 2017-07-05 DIAGNOSIS — I509 Heart failure, unspecified: Secondary | ICD-10-CM

## 2017-07-05 DIAGNOSIS — Z91030 Bee allergy status: Secondary | ICD-10-CM | POA: Diagnosis not present

## 2017-07-05 DIAGNOSIS — F418 Other specified anxiety disorders: Secondary | ICD-10-CM | POA: Diagnosis present

## 2017-07-05 DIAGNOSIS — Z6838 Body mass index (BMI) 38.0-38.9, adult: Secondary | ICD-10-CM

## 2017-07-05 DIAGNOSIS — I5033 Acute on chronic diastolic (congestive) heart failure: Secondary | ICD-10-CM | POA: Diagnosis present

## 2017-07-05 DIAGNOSIS — Z79899 Other long term (current) drug therapy: Secondary | ICD-10-CM | POA: Diagnosis not present

## 2017-07-05 DIAGNOSIS — J811 Chronic pulmonary edema: Secondary | ICD-10-CM | POA: Diagnosis present

## 2017-07-05 LAB — CREATININE, SERUM: Creatinine, Ser: 0.87 mg/dL (ref 0.44–1.00)

## 2017-07-05 LAB — CBC WITH DIFFERENTIAL/PLATELET
BASOS ABS: 0 10*3/uL (ref 0.0–0.1)
BASOS PCT: 0 %
EOS PCT: 1 %
Eosinophils Absolute: 0.1 10*3/uL (ref 0.0–0.7)
HEMATOCRIT: 31.3 % — AB (ref 36.0–46.0)
Hemoglobin: 10 g/dL — ABNORMAL LOW (ref 12.0–15.0)
LYMPHS PCT: 13 %
Lymphs Abs: 2 10*3/uL (ref 0.7–4.0)
MCH: 25.8 pg — ABNORMAL LOW (ref 26.0–34.0)
MCHC: 31.9 g/dL (ref 30.0–36.0)
MCV: 80.7 fL (ref 78.0–100.0)
MONO ABS: 0.6 10*3/uL (ref 0.1–1.0)
MONOS PCT: 4 %
NEUTROS ABS: 12.6 10*3/uL — AB (ref 1.7–7.7)
Neutrophils Relative %: 82 %
PLATELETS: 290 10*3/uL (ref 150–400)
RBC: 3.88 MIL/uL (ref 3.87–5.11)
RDW: 16.9 % — ABNORMAL HIGH (ref 11.5–15.5)
WBC: 15.3 10*3/uL — ABNORMAL HIGH (ref 4.0–10.5)

## 2017-07-05 LAB — BRAIN NATRIURETIC PEPTIDE: B Natriuretic Peptide: 228 pg/mL — ABNORMAL HIGH (ref 0.0–100.0)

## 2017-07-05 LAB — CBC
HCT: 28.3 % — ABNORMAL LOW (ref 36.0–46.0)
Hemoglobin: 9 g/dL — ABNORMAL LOW (ref 12.0–15.0)
MCH: 25.4 pg — ABNORMAL LOW (ref 26.0–34.0)
MCHC: 31.8 g/dL (ref 30.0–36.0)
MCV: 79.9 fL (ref 78.0–100.0)
PLATELETS: 244 10*3/uL (ref 150–400)
RBC: 3.54 MIL/uL — ABNORMAL LOW (ref 3.87–5.11)
RDW: 16.4 % — AB (ref 11.5–15.5)
WBC: 14.9 10*3/uL — AB (ref 4.0–10.5)

## 2017-07-05 LAB — BASIC METABOLIC PANEL
ANION GAP: 9 (ref 5–15)
BUN: 13 mg/dL (ref 6–20)
CALCIUM: 9 mg/dL (ref 8.9–10.3)
CO2: 24 mmol/L (ref 22–32)
CREATININE: 0.91 mg/dL (ref 0.44–1.00)
Chloride: 107 mmol/L (ref 101–111)
GFR calc Af Amer: >60 mL/min (ref >60)
GLUCOSE: 141 mg/dL — AB (ref 65–99)
Potassium: 3.6 mmol/L (ref 3.5–5.1)
Sodium: 140 mmol/L (ref 135–145)

## 2017-07-05 LAB — TROPONIN I
Troponin I: <0.03 ng/mL (ref <0.03)
Troponin I: <0.03 ng/mL (ref <0.03)
Troponin I: <0.03 ng/mL (ref <0.03)
Troponin I: <0.03 ng/mL (ref <0.03)

## 2017-07-05 LAB — MRSA PCR SCREENING: MRSA BY PCR: NEGATIVE

## 2017-07-05 MED ORDER — ENOXAPARIN SODIUM 40 MG/0.4ML ~~LOC~~ SOLN
40.0000 mg | SUBCUTANEOUS | Status: DC
  Administered 2017-07-06: 40 mg via SUBCUTANEOUS
  Filled 2017-07-05: qty 0.4

## 2017-07-05 MED ORDER — CITALOPRAM HYDROBROMIDE 20 MG PO TABS
40.0000 mg | ORAL_TABLET | Freq: Every day | ORAL | Status: DC
  Administered 2017-07-05 – 2017-07-06 (×2): 40 mg via ORAL
  Filled 2017-07-05 (×2): qty 2

## 2017-07-05 MED ORDER — QUETIAPINE FUMARATE 25 MG PO TABS
25.0000 mg | ORAL_TABLET | Freq: Every day | ORAL | Status: DC
  Administered 2017-07-05: 25 mg via ORAL
  Filled 2017-07-05 (×3): qty 1

## 2017-07-05 MED ORDER — ALBUTEROL SULFATE (2.5 MG/3ML) 0.083% IN NEBU
5.0000 mg | INHALATION_SOLUTION | Freq: Once | RESPIRATORY_TRACT | Status: AC
  Administered 2017-07-05: 5 mg via RESPIRATORY_TRACT
  Filled 2017-07-05: qty 6

## 2017-07-05 MED ORDER — POTASSIUM CHLORIDE CRYS ER 20 MEQ PO TBCR
20.0000 meq | EXTENDED_RELEASE_TABLET | Freq: Every day | ORAL | Status: DC
  Administered 2017-07-05 – 2017-07-06 (×2): 20 meq via ORAL
  Filled 2017-07-05 (×3): qty 1

## 2017-07-05 MED ORDER — LEVOFLOXACIN IN D5W 750 MG/150ML IV SOLN
750.0000 mg | INTRAVENOUS | Status: DC
  Administered 2017-07-05 – 2017-07-06 (×2): 750 mg via INTRAVENOUS
  Filled 2017-07-05 (×2): qty 150

## 2017-07-05 MED ORDER — PNEUMOCOCCAL VAC POLYVALENT 25 MCG/0.5ML IJ INJ
0.5000 mL | INJECTION | INTRAMUSCULAR | Status: AC
  Administered 2017-07-06: 0.5 mL via INTRAMUSCULAR
  Filled 2017-07-05: qty 0.5

## 2017-07-05 MED ORDER — FUROSEMIDE 10 MG/ML IJ SOLN
40.0000 mg | Freq: Two times a day (BID) | INTRAMUSCULAR | Status: AC
  Administered 2017-07-05 – 2017-07-06 (×3): 40 mg via INTRAVENOUS
  Filled 2017-07-05 (×3): qty 4

## 2017-07-05 MED ORDER — FERROUS SULFATE 325 (65 FE) MG PO TABS
325.0000 mg | ORAL_TABLET | Freq: Two times a day (BID) | ORAL | Status: DC
  Administered 2017-07-05 – 2017-07-06 (×3): 325 mg via ORAL
  Filled 2017-07-05 (×3): qty 1

## 2017-07-05 MED ORDER — CITALOPRAM HYDROBROMIDE 20 MG PO TABS
40.0000 mg | ORAL_TABLET | Freq: Every day | ORAL | Status: DC
  Filled 2017-07-05: qty 2

## 2017-07-05 MED ORDER — FUROSEMIDE 10 MG/ML IJ SOLN
40.0000 mg | Freq: Once | INTRAMUSCULAR | Status: AC
  Administered 2017-07-05: 40 mg via INTRAVENOUS
  Filled 2017-07-05: qty 4

## 2017-07-05 MED ORDER — METOPROLOL SUCCINATE ER 25 MG PO TB24
25.0000 mg | ORAL_TABLET | Freq: Every day | ORAL | Status: DC
  Filled 2017-07-05: qty 1

## 2017-07-05 MED ORDER — DOCUSATE SODIUM 100 MG PO CAPS
100.0000 mg | ORAL_CAPSULE | Freq: Every day | ORAL | Status: DC
  Administered 2017-07-05 – 2017-07-06 (×2): 100 mg via ORAL
  Filled 2017-07-05 (×2): qty 1

## 2017-07-05 NOTE — Telephone Encounter (Signed)
Returning pt call about med concerns. No answer. LMTCB.

## 2017-07-05 NOTE — H&P (Signed)
TRH H&P    Patient Demographics:    Theresa AbbotLatasha Scrivener, is a 36 y.o. female  MRN: [id number]  DOB - Jun 13, 1981  Admit Date - 07/05/2017  Referring MD/NP/PA: *Dr Rancour  Outpatient Primary MD for the patient is House, Eugenio HoesKaren A, [name]  Patient coming from: Home  Chief Complaint  Patient presents with  . Shortness of Breath      HPI:    Theresa Crawford  is a 36 y.o. female, history of diastolic CHF, peripartum pericarditis and subsequent mitral valve repair who came to hospital after she developed onset of shortness of breath while sleeping last night.  Patient says that she was coughing up pink frothy phlegm.  She was recently discharged from the hospital 3 weeks ago after she was admitted for the same symptoms.  At that time patient's Bumex was discontinued and she was discharged on Lasix 40 mg p.o. Daily. In the ED patient was put on BiPAP, and is breathing better now. She denies chest pain but complains of chest tightness. Denies nausea vomiting or diarrhea. Denies dysuria urgency or frequency of urination.  In the ED he was given Lasix 40 mg IV x1    Review of systems:      All other systems reviewed and are negative.   With Past History of the following :    Past Medical History:  Diagnosis Date  . Chronic diastolic CHF (congestive heart failure) (HCC)   . History of open heart surgery   . Hypertension   . Mitral valve disease    mitral regurgitation  . Normocytic anemia   . Obesity   . Premature atrial contractions   . Pulmonary hypertension (HCC) 06/12/2017  . PVC's (premature ventricular contractions)    a. h/o palpitations with event monitor in 03/2017 showing NSR with PACs/PVCs.      Past Surgical History:  Procedure Laterality Date  . ABDOMINAL HYSTERECTOMY    . CESAREAN SECTION     X's 2  . MITRAL VALVE REPAIR  03/2013    [facility] [place]   . PARTIAL  HYSTERECTOMY  2011   PID w/problem with fallopian tubes  . TEE WITHOUT CARDIOVERSION N/A 05/26/2016   Procedure: TRANSESOPHAGEAL ECHOCARDIOGRAM (TEE);  Surgeon: Laqueta LindenSuresh A Koneswaran, MD;  Location: [facility];  Service: Cardiovascular;  Laterality: N/A;      Social History:      Social History  Substance Use Topics  . Smoking status: Current Some Day Smoker    Packs/day: 0.25    Types: Cigarettes    Start date: 11/03/1999  . Smokeless tobacco: Never Used  . Alcohol use No       Family History :     Family History  Problem Relation Age of Onset  . Heart failure Father        just received LVAD  . Heart disease Paternal Grandfather        stent placement      Home Medications:   Prior to Admission medications   Medication Sig Start Date End  Date Taking? Authorizing Provider  acetaminophen (TYLENOL) 650 MG CR tablet Take 650 mg by mouth every 8 (eight) hours as needed for pain.    [provider]  citalopram (CELEXA) 40 MG tablet Take 40 mg by mouth at bedtime.     [provider]  docusate sodium (COLACE) 100 MG capsule Take 100 mg by mouth daily.    [provider]  ferrous sulfate 325 (65 FE) MG tablet Take 325 mg by mouth 2 (two) times daily with a meal.    [provider]  furosemide (LASIX) 80 MG tablet Take 0.5 tablets (40 mg total) by mouth daily. 06/13/17   Standley Brooking, MD  metoprolol succinate (TOPROL-XL) 25 MG 24 hr tablet Take 1 tablet (25 mg total) by mouth daily. 06/13/17   Standley Brooking, MD  potassium chloride SA (K-DUR,KLOR-CON) 20 MEQ tablet Take 1 tablet (20 mEq total) by mouth daily. 03/24/17   Philip Aspen, Limmie Patricia, MD  QUEtiapine (SEROQUEL) 25 MG tablet Take 25 mg by mouth at bedtime.     [provider]     Allergies:     Allergies  Allergen Reactions  . Lisinopril Shortness Of Breath and Swelling    Throat swelling  . Penicillins Shortness Of Breath and Swelling    Has patient had a  PCN reaction causing immediate rash, facial/tongue/throat swelling, SOB or lightheadedness with hypotension: Yes Has patient had a PCN reaction causing severe rash involving mucus membranes or skin necrosis: Yes Has patient had a PCN reaction that required hospitalization No Has patient had a PCN reaction occurring within the last 10 years: No If all of the above answers are "NO", then may proceed with Cephalosporin use.   . Bee Venom   . Contrast Media [Iodinated Diagnostic Agents] Hives and Itching    Itching and hives to throat, neck, face and arms.   No difficulty breathing.   This was given at [facility] approximately 2015  . Shellfish Allergy      Physical Exam:   Vitals  Blood pressure (!) 98/41, pulse 94, resp. rate (!) 28, last menstrual period 06/20/2017, SpO2 94 %.  1.  General: Appears comfortable on BiPAP  2. Psychiatric:  Intact judgement and  insight, awake alert, oriented x 3.  3. Neurologic: No focal neurological deficits, all cranial nerves intact.Strength 5/5 all 4 extremities, sensation intact all 4 extremities, plantars down going.  4. Eyes :  anicteric sclerae, moist conjunctivae with no lid lag. PERRLA.  5. ENMT:  Oropharynx clear with moist mucous membranes and good dentition  6. Neck:  supple, no cervical lymphadenopathy appriciated, No thyromegaly  7. Respiratory : Normal respiratory effort, good air movement bilaterally,clear to  auscultation bilaterally  8. Cardiovascular : RRR, no gallops, rubs or murmurs, no leg edema  9. Gastrointestinal:  Positive bowel sounds, abdomen soft, non-tender to palpation,no hepatosplenomegaly, no rigidity or guarding       10. Skin:  No cyanosis, normal texture and turgor, no rash, lesions or ulcers  11.Musculoskeletal:  Good muscle tone,  joints appear normal , no effusions,  normal range of motion    Data Review:    CBC  Recent Labs Lab 07/05/17 0226  WBC 15.3*  HGB 10.0*  HCT  31.3*  PLT 290  MCV 80.7  MCH 25.8*  MCHC 31.9  RDW 16.9*  LYMPHSABS 2.0  MONOABS 0.6  EOSABS 0.1  BASOSABS 0.0   ------------------------------------------------------------------------------------------------------------------  Chemistries   Recent Labs Lab 07/05/17  0226  NA 140  K 3.6  CL 107  CO2 24  GLUCOSE 141*  BUN 13  CREATININE 0.91  CALCIUM 9.0   ------------------------------------------------------------------------------------------------------------------  ------------------------------------------------------------------------------------------------------------------ GFR: CrCl cannot be calculated (Unknown ideal weight.). Liver Function Tests: No results for input(s): AST, ALT, ALKPHOS, BILITOT, PROT, ALBUMIN in the last 168 hours. No results for input(s): LIPASE, AMYLASE in the last 168 hours. No results for input(s): AMMONIA in the last 168 hours. Coagulation Profile: No results for input(s): INR, PROTIME in the last 168 hours. Cardiac Enzymes:  Recent Labs Lab 07/05/17 0226  TROPONINI <0.03   BNP (last 3 results) No results for input(s): PROBNP in the last 8760 hours. HbA1C: No results for input(s): HGBA1C in the last 72 hours. CBG: No results for input(s): GLUCAP in the last 168 hours. Lipid Profile: No results for input(s): CHOL, HDL, LDLCALC, TRIG, CHOLHDL, LDLDIRECT in the last 72 hours. Thyroid Function Tests: No results for input(s): TSH, T4TOTAL, FREET4, T3FREE, THYROIDAB in the last 72 hours. Anemia Panel: No results for input(s): VITAMINB12, FOLATE, FERRITIN, TIBC, IRON, RETICCTPCT in the last 72 hours.  --------------------------------------------------------------------------------------------------------------- Urine analysis:    Component Value Date/Time   COLORURINE YELLOW 05/23/2016 2027   APPEARANCEUR CLEAR 05/23/2016 2027   LABSPEC <1.005 (L) 05/23/2016 2027   PHURINE 6.5 05/23/2016 2027   GLUCOSEU NEGATIVE  05/23/2016 2027   HGBUR NEGATIVE 05/23/2016 2027   BILIRUBINUR NEGATIVE 05/23/2016 2027   KETONESUR NEGATIVE 05/23/2016 2027   PROTEINUR NEGATIVE 05/23/2016 2027   NITRITE NEGATIVE 05/23/2016 2027   LEUKOCYTESUR TRACE (A) 05/23/2016 2027      Imaging Results:    Dg Chest Portable 1 View  Result Date: 07/05/2017 CLINICAL DATA:  Acute onset of shortness of breath and productive cough. Initial encounter. EXAM: PORTABLE CHEST 1 VIEW COMPARISON:  Chest radiograph performed 06/13/2017 FINDINGS: The lungs are well-aerated. Vascular congestion is noted. Right basilar and left perihilar airspace opacification raises concern for multifocal pneumonia. Superimposed pulmonary edema cannot be excluded. A small right pleural effusion is noted. There is no evidence of pneumothorax. The cardiomediastinal silhouette is mildly enlarged. The patient is status post median sternotomy. A valve replacement is noted. No acute osseous abnormalities are seen. IMPRESSION: Vascular congestion and mild cardiomegaly. Right basilar and left perihilar airspace opacification raises concern for multifocal pneumonia. Superimposed pulmonary edema cannot be excluded. Small right pleural effusion noted. Electronically Signed   By: Roanna Raider M.D.   On: 07/05/2017 03:20    My personal review of EKG: Rhythm NSR, nonspecific T wave abnormalities   Assessment & Plan:    Active Problems:   Pulmonary edema   1. Pulmonary edema-secondary to acute on chronic diastolic CHF in the setting of severe mitral stenosis, patient received Lasix 40 mg IV x1 in the ED.  We will start Lasix 40 mg IV every 12 hours.  Will consult cardiology in a.m. 2. Chest tightness-troponin is negative in the ED, EKG shows nonspecific T wave abnormalities.  Will cycle troponin every 6 hours x3 3. Hypertension-continue Toprol-XL 25 mg p.o. Daily    DVT Prophylaxis-   Lovenox   AM Labs Ordered, also please review Full Orders  Family Communication:  Admission, patients condition and plan of care including tests being ordered have been discussed with the patient  who indicate understanding and agree with the plan and Code Status.  Code Status: Full code  Admission status: Inpatient  Time spent in minutes : 50 minutes   Wes Lezotte S M.D on 07/05/2017 at 5:08 AM  Between 7am to 7pm - Pager - [phone]. After 7pm go to [email or web address] - password [facility]  [facility] - Office  [phone]

## 2017-07-05 NOTE — Progress Notes (Signed)
**Note De-Identified  Obfuscation** Patient removed from BIPAP by RN and placed on [place]. Patient tolerated well.  RRT to continue to monitor

## 2017-07-05 NOTE — Progress Notes (Signed)
Patient not using BIPAP at this time. Unit on standby in room.

## 2017-07-05 NOTE — Progress Notes (Signed)
Pt placed on BIPAP following nebulizer treatment. Pt is coughing up pink secretions and is unable to speak in complete sentences due to SOB. Pt tolerating BIPAP well. Will continue to monitor closely.

## 2017-07-05 NOTE — Progress Notes (Signed)
Patient admitted to the hospital earlier this morning by Dr. Sharl MaLama.  Patient seen and examined.  She is currently on BiPAP.  Feels that breathing has improved since admission.  She does have an occasional cough.  She been running a low-grade temperature since admission.  No nausea or vomiting.  No chest pain.  Symptoms began suddenly.  She has not noticed any worsening lower extremity edema or changes in weight.  This patient was admitted to the hospital with acute respiratory distress.  She was initially placed on nonrebreather and subsequently transitioned to BiPAP.  Chest x-ray shows bilateral infiltrates concerning for decompensated CHF, although multifocal pneumonia cannot be ruled out.  She has been started on IV Lasix.  Since she is having low-grade temperatures since admission, will also start on Levaquin for now.  Wean off BiPAP as tolerated.  MEMON,JEHANZEB

## 2017-07-05 NOTE — ED Notes (Signed)
ED Provider at bedside. 

## 2017-07-05 NOTE — ED Provider Notes (Signed)
[facility] EMERGENCY DEPARTMENT Provider Note   CSN: [id number] Arrival date & time: 07/05/17  0208     History   Chief Complaint Chief Complaint  Patient presents with  . Shortness of Breath    HPI Theresa Crawford is a 36 y.o. female.  Level 5 caveat for respiratory distress.  Patient presents via EMS after waking from sleep with shortness of breath and chest tightness.  States she was well when she went to bed.  Patient found to be tachypneic, tripoding and hypoxic in the low 90s.  She is coughing up pink tinged sputum.  She planes of diffuse chest tightness pain with breathing.  Denies any fever.  Denies any leg swelling. The patient has history of diastolic heart failure and mitral stenosis status post mitral valve repair.  During her recent hospitalization 3 weeks ago her diuretics were switched from Bumex to Lasix which she states compliance with.  She states she has not had any problems until tonight.  Denies any excessive salt intake.   The history is provided by the patient and the EMS personnel. The history is limited by the condition of the patient.  Shortness of Breath     Past Medical History:  Diagnosis Date  . Chronic diastolic CHF (congestive heart failure) (HCC)   . History of open heart surgery   . Hypertension   . Mitral valve disease    mitral regurgitation  . Normocytic anemia   . Obesity   . Premature atrial contractions   . Pulmonary hypertension (HCC) 06/12/2017  . PVC's (premature ventricular contractions)    a. h/o palpitations with event monitor in 03/2017 showing NSR with PACs/PVCs.    Patient Active Problem List   Diagnosis Date Noted  . Hypokalemia 06/12/2017  . Pulmonary hypertension (HCC) 06/12/2017  . Flash pulmonary edema (HCC) 06/11/2017  . Depression with anxiety 06/11/2017  . Mitral valve stenosis, severe 06/11/2017  . Acute pulmonary edema (HCC) 10/10/2016  . S/P mitral valve repair   . CAP (community acquired pneumonia)  05/25/2016  . Leukocytosis 05/24/2016  . Acute respiratory failure with hypoxia (HCC) 05/23/2016  . Acute on chronic diastolic CHF (congestive heart failure) (HCC) 05/23/2016  . Cough with hemoptysis 05/23/2016  . Postpartum complication pericarditis in 2009 with eventual needing MVR in 2015 05/23/2016  . Anemia, iron deficiency 05/23/2016  . Hypertension   . SOB (shortness of breath)   . Respiratory distress 02/16/2016  . Essential hypertension 02/16/2016    Past Surgical History:  Procedure Laterality Date  . ABDOMINAL HYSTERECTOMY    . CESAREAN SECTION     X's 2  . MITRAL VALVE REPAIR  03/2013    [facility] in [place], [place]   . PARTIAL HYSTERECTOMY  2011   PID w/problem with fallopian tubes  . TEE WITHOUT CARDIOVERSION N/A 05/26/2016   Procedure: TRANSESOPHAGEAL ECHOCARDIOGRAM (TEE);  Surgeon: [name], MD;  Location: [facility];  Service: Cardiovascular;  Laterality: N/A;    OB History    Gravida Para Term Preterm AB Living   2 2 2          SAB TAB Ectopic Multiple Live Births                   Home Medications    Prior to Admission medications   Medication Sig Start Date End Date Taking? Authorizing Provider  acetaminophen (TYLENOL) 650 MG CR tablet Take 650 mg by mouth every 8 (eight) hours as needed for pain.  [provider]  citalopram (CELEXA) 40 MG tablet Take 40 mg by mouth at bedtime.     [provider]  docusate sodium (COLACE) 100 MG capsule Take 100 mg by mouth daily.    [provider]  ferrous sulfate 325 (65 FE) MG tablet Take 325 mg by mouth 2 (two) times daily with a meal.    [provider]  furosemide (LASIX) 80 MG tablet Take 0.5 tablets (40 mg total) by mouth daily. 06/13/17   Standley Brooking, MD  metoprolol succinate (TOPROL-XL) 25 MG 24 hr tablet Take 1 tablet (25 mg total) by mouth daily. 06/13/17   Standley Brooking, MD  potassium chloride SA (K-DUR,KLOR-CON) 20 MEQ tablet  Take 1 tablet (20 mEq total) by mouth daily. 03/24/17   Philip Aspen, Limmie Patricia, MD  QUEtiapine (SEROQUEL) 25 MG tablet Take 25 mg by mouth at bedtime.     [provider]    Family History Family History  Problem Relation Age of Onset  . Heart failure Father        just received LVAD  . Heart disease Paternal Grandfather        stent placement    Social History Social History  Substance Use Topics  . Smoking status: Current Some Day Smoker    Packs/day: 0.25    Types: Cigarettes    Start date: 11/03/1999  . Smokeless tobacco: Never Used  . Alcohol use No     Allergies   Lisinopril; Penicillins; Bee venom; Contrast media [iodinated diagnostic agents]; and Shellfish allergy   Review of Systems Review of Systems  Unable to perform ROS: Severe respiratory distress  Respiratory: Positive for shortness of breath.      Physical Exam Updated Vital Signs BP 91/63   Pulse 94   Resp (!) 21   LMP 06/20/2017   SpO2 96%   Physical Exam  Constitutional: She is oriented to person, place, and time. She appears well-developed and well-nourished. She appears distressed.  Tachypneic. Speaking in 1-2 word phrases  HENT:  Head: Normocephalic and atraumatic.  Mouth/Throat: Oropharynx is clear and moist. No oropharyngeal exudate.  Coughing up pink tinged sputum.  Eyes: Pupils are equal, round, and reactive to light. Conjunctivae and EOM are normal.  Neck: Normal range of motion. Neck supple.  No meningismus.  Cardiovascular: Normal rate, regular rhythm, normal heart sounds and intact distal pulses.   No murmur heard. Pulmonary/Chest: She is in respiratory distress. She has rales.   Moderate respiratory distress Diffuse crackles  Abdominal: Soft. There is no tenderness. There is no rebound and no guarding.  Musculoskeletal: Normal range of motion. She exhibits no edema or tenderness.  Neurological: She is alert and oriented to person, place, and time. No cranial nerve  deficit. She exhibits normal muscle tone. Coordination normal.   5/5 strength throughout. CN 2-12 intact.Equal grip strength.   Skin: Skin is warm.  Psychiatric: She has a normal mood and affect. Her behavior is normal.  Nursing note and vitals reviewed.    ED Treatments / Results  Labs (all labs ordered are listed, but only abnormal results are displayed) Labs Reviewed  CBC WITH DIFFERENTIAL/PLATELET - Abnormal; Notable for the following:       Result Value   WBC 15.3 (*)    Hemoglobin 10.0 (*)    HCT 31.3 (*)    MCH 25.8 (*)    RDW 16.9 (*)    Neutro Abs 12.6 (*)    All other components  within normal limits  BASIC METABOLIC PANEL - Abnormal; Notable for the following:    Glucose, Bld 141 (*)    All other components within normal limits  BRAIN NATRIURETIC PEPTIDE - Abnormal; Notable for the following:    B Natriuretic Peptide 228.0 (*)    All other components within normal limits  TROPONIN I    EKG  EKG Interpretation  Date/Time:  Tuesday July 05 2017 02:18:34 EDT Ventricular Rate:  92 PR Interval:    QRS Duration: 87 QT Interval:  378 QTC Calculation: 468 R Axis:   59 Text Interpretation:  Sinus rhythm Nonspecific T abnormalities, lateral leads Artifact No significant change was found Confirmed by Glynn Octave [phone]) on 07/05/2017 2:29:53 AM       Radiology Dg Chest Portable 1 View  Result Date: 07/05/2017 CLINICAL DATA:  Acute onset of shortness of breath and productive cough. Initial encounter. EXAM: PORTABLE CHEST 1 VIEW COMPARISON:  Chest radiograph performed 06/13/2017 FINDINGS: The lungs are well-aerated. Vascular congestion is noted. Right basilar and left perihilar airspace opacification raises concern for multifocal pneumonia. Superimposed pulmonary edema cannot be excluded. A small right pleural effusion is noted. There is no evidence of pneumothorax. The cardiomediastinal silhouette is mildly enlarged. The patient is status post median sternotomy.  A valve replacement is noted. No acute osseous abnormalities are seen. IMPRESSION: Vascular congestion and mild cardiomegaly. Right basilar and left perihilar airspace opacification raises concern for multifocal pneumonia. Superimposed pulmonary edema cannot be excluded. Small right pleural effusion noted. Electronically Signed   By: Roanna Raider M.D.   On: 07/05/2017 03:20    Procedures Procedures (including critical care time)  Medications Ordered in ED Medications  albuterol (PROVENTIL) (2.5 MG/3ML) 0.083% nebulizer solution 5 mg (5 mg Nebulization Given 07/05/17 0253)  furosemide (LASIX) injection 40 mg (40 mg Intravenous Given 07/05/17 0311)     Initial Impression / Assessment and Plan / ED Course  I have reviewed the triage vital signs and the nursing notes.  Pertinent labs & imaging results that were available during my care of the patient were reviewed by me and considered in my medical decision making (see chart for details).     Sudden onset shortness of breath in patient with known diastolic heart failure and mitral valve stenosis.  Suspect flash pulmonary edema.  Patient given IV Lasix and placed on BiPAP on arrival.  Labs show leukocytosis.  Chest x-ray consistent with pulmonary edema.  Creatinine at baseline.  Troponin negative.  The patient feels improved on BiPAP.  Blood pressure remains soft.  Unable to give nitroglycerin.  She is diuresing after Lasix and is starting to feel better.  Denies chest pain.  Suspect flash pulmonary edema in setting of diastolic heart failure with mitral valve stenosis.  Admission to stepdown unit discussed with Dr. Sharl Ma.  CRITICAL CARE Performed by: Glynn Octave Total critical care time: 45 minutes Critical care time was exclusive of separately billable procedures and treating other patients. Critical care was necessary to treat or prevent imminent or life-threatening deterioration. Critical care was time spent personally by me  on the following activities: development of treatment plan with patient and/or surrogate as well as nursing, discussions with consultants, evaluation of patient's response to treatment, examination of patient, obtaining history from patient or surrogate, ordering and performing treatments and interventions, ordering and review of laboratory studies, ordering and review of radiographic studies, pulse oximetry and re-evaluation of patient's condition.  Final Clinical Impressions(s) / ED Diagnoses   Final diagnoses:  Flash pulmonary edema (HCC)  Acute on chronic diastolic congestive heart failure [facility](HCC)    New Prescriptions New Prescriptions   No medications on file     Glynn Octaveancour, Lancelot Alyea, MD 07/05/17 [phone]

## 2017-07-05 NOTE — ED Triage Notes (Addendum)
Pt states she woke up about 0130 with sob; when ems arrived pt found to be hyperventilating and O2 sats in the low 90's; ems placed pt on NRB and pt increased to 96-98%; pt is alert and oriented at this time; pt states she has been coughing up pink tinged sputum

## 2017-07-05 NOTE — Progress Notes (Signed)
Removed a large amount of blankets from patient to re-check temp after cooling on admission, Recheck at 8AM.

## 2017-07-06 ENCOUNTER — Inpatient Hospital Stay (HOSPITAL_COMMUNITY): Payer: Medicaid Other

## 2017-07-06 DIAGNOSIS — Z9889 Other specified postprocedural states: Secondary | ICD-10-CM

## 2017-07-06 DIAGNOSIS — R0603 Acute respiratory distress: Secondary | ICD-10-CM

## 2017-07-06 DIAGNOSIS — I10 Essential (primary) hypertension: Secondary | ICD-10-CM

## 2017-07-06 LAB — COMPREHENSIVE METABOLIC PANEL
ALBUMIN: 3.6 g/dL (ref 3.5–5.0)
ALK PHOS: 51 U/L (ref 38–126)
ALT: 9 U/L — AB (ref 14–54)
ANION GAP: 7 (ref 5–15)
AST: 11 U/L — AB (ref 15–41)
BUN: 14 mg/dL (ref 6–20)
CALCIUM: 8.2 mg/dL — AB (ref 8.9–10.3)
CO2: 25 mmol/L (ref 22–32)
Chloride: 104 mmol/L (ref 101–111)
Creatinine, Ser: 0.82 mg/dL (ref 0.44–1.00)
GFR calc Af Amer: >60 mL/min (ref >60)
GFR calc non Af Amer: >60 mL/min (ref >60)
GLUCOSE: 103 mg/dL — AB (ref 65–99)
Potassium: 3.3 mmol/L — ABNORMAL LOW (ref 3.5–5.1)
SODIUM: 136 mmol/L (ref 135–145)
Total Bilirubin: 0.9 mg/dL (ref 0.3–1.2)
Total Protein: 6.6 g/dL (ref 6.5–8.1)

## 2017-07-06 LAB — CBC
HCT: 28.1 % — ABNORMAL LOW (ref 36.0–46.0)
HEMOGLOBIN: 8.7 g/dL — AB (ref 12.0–15.0)
MCH: 24.9 pg — ABNORMAL LOW (ref 26.0–34.0)
MCHC: 31 g/dL (ref 30.0–36.0)
MCV: 80.5 fL (ref 78.0–100.0)
Platelets: 255 10*3/uL (ref 150–400)
RBC: 3.49 MIL/uL — AB (ref 3.87–5.11)
RDW: 16.3 % — ABNORMAL HIGH (ref 11.5–15.5)
WBC: 9.8 10*3/uL (ref 4.0–10.5)

## 2017-07-06 MED ORDER — FUROSEMIDE 80 MG PO TABS: 80.0000 mg | ORAL_TABLET | Freq: Every day | ORAL | 0 refills | Status: DC

## 2017-07-06 MED ORDER — POTASSIUM CHLORIDE CRYS ER 20 MEQ PO TBCR
40.0000 meq | EXTENDED_RELEASE_TABLET | Freq: Once | ORAL | Status: AC
  Administered 2017-07-06: 40 meq via ORAL
  Filled 2017-07-06: qty 2

## 2017-07-06 MED ORDER — LEVOFLOXACIN 750 MG PO TABS: 750.0000 mg | ORAL_TABLET | Freq: Every day | ORAL | 0 refills | Status: AC

## 2017-07-06 MED ORDER — FUROSEMIDE 40 MG PO TABS: 40.0000 mg | ORAL_TABLET | Freq: Every day | ORAL | Status: DC

## 2017-07-06 MED ORDER — POTASSIUM CHLORIDE CRYS ER 20 MEQ PO TBCR: 40.0000 meq | EXTENDED_RELEASE_TABLET | Freq: Every day | ORAL | 0 refills | Status: DC

## 2017-07-06 NOTE — Evaluation (Signed)
Physical Therapy Evaluation Patient Details Name: Janalee DaneLatasha Y Saur MRN: [id number] DOB: 08/12/1981 Today's Date: 07/06/2017   History of Present Illness  Gwenevere AbbotLatasha Riggan  is a 36 y.o. female, history of diastolic CHF, peripartum pericarditis and subsequent mitral valve repair who came to hospital after she developed onset of shortness of breath while sleeping last night.  Patient says that she was coughing up pink frothy phlegm.  She was recently discharged from the hospital 3 weeks ago after she was admitted for the same symptoms.  At that time patient's Bumex was discontinued and she was discharged on Lasix 40 mg p.o. Daily.    Clinical Impression  Patient is functioning at baseline for functional mobility and gait.  Patient discharged to care of nursing for ambulation daily as tolerated for length of stay.    Follow Up Recommendations No PT follow up    Equipment Recommendations  None recommended by PT    Recommendations for Other Services       Precautions / Restrictions Precautions Precautions: None Restrictions Weight Bearing Restrictions: No      Mobility  Bed Mobility Overal bed mobility: Independent                Transfers Overall transfer level: Independent                  Ambulation/Gait Ambulation/Gait assistance: Independent Ambulation Distance (Feet): 200 Feet Assistive device: None Gait Pattern/deviations: WFL(Within Functional Limits)   Gait velocity interpretation: at or above normal speed for age/gender    Stairs            Wheelchair Mobility    Modified Rankin (Stroke Patients Only)       Balance Overall balance assessment: No apparent balance deficits (not formally assessed)                                           Pertinent Vitals/Pain Pain Assessment: No/denies pain    Home Living Family/patient expects to be discharged to:: Private residence Living Arrangements: Parent Available Help at  Discharge: Family Type of Home: House Home Access: Level entry     Home Layout: One level Home Equipment: None      Prior Function Level of Independence: Independent               Hand Dominance        Extremity/Trunk Assessment   Upper Extremity Assessment Upper Extremity Assessment: Overall WFL for tasks assessed    Lower Extremity Assessment Lower Extremity Assessment: Overall WFL for tasks assessed    Cervical / Trunk Assessment Cervical / Trunk Assessment: Normal  Communication   Communication: No difficulties  Cognition Arousal/Alertness: Awake/alert Behavior During Therapy: WFL for tasks assessed/performed Overall Cognitive Status: Within Functional Limits for tasks assessed                                        General Comments      Exercises     Assessment/Plan    PT Assessment Patent does not need any further PT services  PT Problem List         PT Treatment Interventions      PT Goals (Current goals can be found in the Care Plan section)  Acute Rehab PT Goals Patient Stated Goal: return  home PT Goal Formulation: With patient Time For Goal Achievement: 07/06/17 Potential to Achieve Goals: Good    Frequency     Barriers to discharge        Co-evaluation               AM-PAC PT "6 Clicks" Daily Activity  Outcome Measure Difficulty turning over in bed (including adjusting bedclothes, sheets and blankets)?: None Difficulty moving from lying on back to sitting on the side of the bed? : None Difficulty sitting down on and standing up from a chair with arms (e.g., wheelchair, bedside commode, etc,.)?: None Help needed moving to and from a bed to chair (including a wheelchair)?: None Help needed walking in hospital room?: None Help needed climbing 3-5 steps with a railing? : None 6 Click Score: 24    End of Session   Activity Tolerance: Patient tolerated treatment well Patient left: in chair;with call  bell/phone within reach Nurse Communication: Mobility status PT Visit Diagnosis: Unsteadiness on feet (R26.81);Other abnormalities of gait and mobility (R26.89);Muscle weakness (generalized) (M62.81)    Time: 1610-9604 PT Time Calculation (min) (ACUTE ONLY): 25 min   Charges:   PT Evaluation $PT Eval Low Complexity: 1 Low PT Treatments $Therapeutic Activity: 23-37 mins   PT G Codes:   PT G-Codes **NOT FOR INPATIENT CLASS** Functional Assessment Tool Used: AM-PAC 6 Clicks Basic Mobility Functional Limitation: Mobility: Walking and moving around Mobility: Walking and Moving Around Current Status (V4098): 0 percent impaired, limited or restricted Mobility: Walking and Moving Around Goal Status (J1914): 0 percent impaired, limited or restricted Mobility: Walking and Moving Around Discharge Status [phone]): 0 percent impaired, limited or restricted    1:24 PM, 07/06/17 Ocie Bob, MPT Physical Therapist with [facility] [phone] office [phone] mobile phone

## 2017-07-06 NOTE — Progress Notes (Signed)
Patient resting well without BIPAP. Unit still on standby.

## 2017-07-06 NOTE — Discharge Instructions (Signed)
Follow with Primary MD  House, Eugenio HoesKaren A, [name]  and other consultant's as instructed your Hospitalist MD  Please get a complete blood count and chemistry panel checked by your Primary MD at your next visit, and again as instructed by your Primary MD.  Get Medicines reviewed and adjusted: Please take all your medications with you for your next visit with your Primary MD  Laboratory/radiological data: Please request your Primary MD to go over all hospital tests and procedure/radiological results at the follow up, please ask your Primary MD to get all Hospital records sent to his/her office.  In some cases, they will be blood work, cultures and biopsy results pending at the time of your discharge. Please request that your primary care M.D. follows up on these results.  Also Note the following: If you experience worsening of your admission symptoms, develop shortness of breath, life threatening emergency, suicidal or homicidal thoughts you must seek medical attention immediately by calling 911 or calling your MD immediately  if symptoms less severe.  You must read complete instructions/literature along with all the possible adverse reactions/side effects for all the Medicines you take and that have been prescribed to you. Take any new Medicines after you have completely understood and accpet all the possible adverse reactions/side effects.   Do not drive when taking Pain medications or sleeping medications (Benzodaizepines)  Do not take more than prescribed Pain, Sleep and Anxiety Medications. It is not advisable to combine anxiety,sleep and pain medications without talking with your primary care practitioner  Special Instructions: If you have smoked or chewed Tobacco  in the last 2 yrs please stop smoking, stop any regular Alcohol  and or any Recreational drug use.  Wear Seat belts while driving.  Please note: You were cared for by a hospitalist during your hospital stay. Once you are discharged,  your primary care physician will handle any further medical issues. Please note that NO REFILLS for any discharge medications will be authorized once you are discharged, as it is imperative that you return to your primary care physician (or establish a relationship with a primary care physician if you do not have one) for your post hospital discharge needs so that they can reassess your need for medications and monitor your lab values.

## 2017-07-06 NOTE — Discharge Summary (Signed)
Physician Discharge Summary  Theresa Crawford ZOX:[id number] DOB: 1981/02/08 DOA: 07/05/2017  PCP: Jerrell BelfastHouse, [name], [name]  Admit date: 07/05/2017 Discharge date: 07/06/2017  Admitted From: Home  Disposition: Home   Recommendations for Outpatient Follow-up:  1. Follow up with PCP in 1 weeks 2. Follow up with cardiology PA / cardiologist in 1-2 weeks 3. Please evaluate oral lasix dose (it was increased from 40 mg to 80 mg at discharge) 4. Please obtain BMP/CBC in one week 5. Please repeat chest xray within 4 weeks to ensure full resolution  Discharge Condition: STABLE   CODE STATUS: FULL    Brief Hospitalization Summary: Please see all hospital notes, images, labs for full details of the hospitalization. FROM HPI:  Theresa AbbotLatasha Crawford  is a 36 y.o. female, history of diastolic CHF, peripartum pericarditis and subsequent mitral valve repair who came to hospital after she developed onset of shortness of breath while sleeping last night.  Patient says that she was coughing up pink frothy phlegm.  She was recently discharged from the hospital 3 weeks ago after she was admitted for the same symptoms.  At that time patient's Bumex was discontinued and she was discharged on Lasix 40 mg p.o. Daily.  In the ED patient was put on BiPAP, and is breathing better now.  She denies chest pain but complains of chest tightness.  Denies nausea vomiting or diarrhea.  Denies dysuria urgency or frequency of urination.  The patient was noted to have a low-grade fever.  She was started on antibiotics for presumed pneumonia.  The chest x-ray also was suspicious for multifocal pneumonia.  She was treated with IV levofloxacin with good results.  Her symptoms improved considerably.  She was also diuresed with IV Lasix.  She did have a repeat chest x-ray done that showed improvement.  The patient reported that she would like to go home today because she has an event with her daughter and is planned and she feels well enough for home.   She does report that the current oral Lasix dose that she is taking which is 40 mg daily does not seem to be diuresing her as well as she would like.  The patient will be discharged on a higher dose of Lasix 80 mg daily until she can follow-up with her PCP and cardiologist for further evaluation and management.  The patient should be seen by her PCP within a week.  She should be seen by the cardiology PA or cardiologist in 1-2 weeks for follow-up.   Discharge Diagnoses:  Active Problems:   Respiratory distress   Acute on chronic diastolic CHF (congestive heart failure) (HCC)   Hypertension   S/P mitral valve repair   Pulmonary hypertension (HCC)   Pulmonary edema    Discharge Instructions: Discharge Instructions    (HEART FAILURE PATIENTS) Call MD:  Anytime you have any of the following symptoms: 1) 3 pound weight gain in 24 hours or 5 pounds in 1 week 2) shortness of breath, with or without a dry hacking cough 3) swelling in the hands, feet or stomach 4) if you have to sleep on extra pillows at night in order to breathe.    Complete by:  As directed    Call MD for:  difficulty breathing, headache or visual disturbances    Complete by:  As directed    Call MD for:  extreme fatigue    Complete by:  As directed    Call MD for:  hives    Complete  by:  As directed    Call MD for:  persistant dizziness or light-headedness    Complete by:  As directed    Call MD for:  persistant nausea and vomiting    Complete by:  As directed    Call MD for:  severe uncontrolled pain    Complete by:  As directed    Diet - low sodium heart healthy    Complete by:  As directed    Increase activity slowly    Complete by:  As directed      Allergies as of 07/06/2017      Reactions   Lisinopril Shortness Of Breath, Swelling   Throat swelling   Penicillins Shortness Of Breath, Swelling   Has patient had a PCN reaction causing immediate rash, facial/tongue/throat swelling, SOB or lightheadedness with  hypotension: Yes Has patient had a PCN reaction causing severe rash involving mucus membranes or skin necrosis: Yes Has patient had a PCN reaction that required hospitalization No Has patient had a PCN reaction occurring within the last 10 years: No If all of the above answers are "NO", then may proceed with Cephalosporin use.   Bee Venom    Contrast Media [iodinated Diagnostic Agents] Hives, Itching   Itching and hives to throat, neck, face and arms.   No difficulty breathing.   This was given at [facility] approximately 2015   Shellfish Allergy       Medication List    TAKE these medications   citalopram 40 MG tablet Commonly known as:  CELEXA Take 40 mg by mouth at bedtime.   docusate sodium 100 MG capsule Commonly known as:  COLACE Take 100 mg by mouth daily.   ferrous sulfate 325 (65 FE) MG tablet Take 325 mg by mouth 2 (two) times daily with a meal.   furosemide 80 MG tablet Commonly known as:  LASIX Take 1 tablet (80 mg total) by mouth daily. What changed:  how much to take   levofloxacin 750 MG tablet Commonly known as:  LEVAQUIN Take 1 tablet (750 mg total) by mouth daily.   metoprolol succinate 25 MG 24 hr tablet Commonly known as:  TOPROL-XL Take 1 tablet (25 mg total) by mouth daily.   potassium chloride SA 20 MEQ tablet Commonly known as:  K-DUR,KLOR-CON Take 2 tablets (40 mEq total) by mouth daily. What changed:  how much to take   QUEtiapine 25 MG tablet Commonly known as:  SEROQUEL Take 25 mg by mouth at bedtime.      Follow-up Information    House, Eugenio Hoes, [name]. Schedule an appointment as soon as possible for a visit in 1 week(s).   Specialty:  Nurse Practitioner Why:  Hospital Follow Up  Contact information: [address] [place] [address] [phone]        [name], MD. Schedule an appointment as soon as possible for a visit in 2 week(s).   Specialty:  Cardiology Why:  Hospital Follow Up Contact  information: [address] [place] [address] [phone]        Jodelle Gross, NP. Schedule an appointment as soon as possible for a visit in 1 week(s).   Specialties:  Nurse Practitioner, Radiology, Cardiology Why:  Hospital Follow Up Contact information: [address] [place] [address] [phone]          Allergies  Allergen Reactions  . Lisinopril Shortness Of Breath and Swelling    Throat swelling  . Penicillins Shortness  Of Breath and Swelling    Has patient had a PCN reaction causing immediate rash, facial/tongue/throat swelling, SOB or lightheadedness with hypotension: Yes Has patient had a PCN reaction causing severe rash involving mucus membranes or skin necrosis: Yes Has patient had a PCN reaction that required hospitalization No Has patient had a PCN reaction occurring within the last 10 years: No If all of the above answers are "NO", then may proceed with Cephalosporin use.   . Bee Venom   . Contrast Media [Iodinated Diagnostic Agents] Hives and Itching    Itching and hives to throat, neck, face and arms.   No difficulty breathing.   This was given at [facility] approximately 2015  . Shellfish Allergy    Current Discharge Medication List    START taking these medications   Details  levofloxacin (LEVAQUIN) 750 MG tablet Take 1 tablet (750 mg total) by mouth daily. Qty: 5 tablet, Refills: 0      CONTINUE these medications which have CHANGED   Details  furosemide (LASIX) 80 MG tablet Take 1 tablet (80 mg total) by mouth daily. Qty: 30 tablet, Refills: 0    potassium chloride SA (K-DUR,KLOR-CON) 20 MEQ tablet Take 2 tablets (40 mEq total) by mouth daily. Qty: 60 tablet, Refills: 0      CONTINUE these medications which have NOT CHANGED   Details  citalopram (CELEXA) 40 MG tablet Take 40 mg by mouth at bedtime.     docusate sodium (COLACE) 100 MG capsule Take 100 mg by mouth daily.    ferrous sulfate 325 (65 FE) MG tablet  Take 325 mg by mouth 2 (two) times daily with a meal.    metoprolol succinate (TOPROL-XL) 25 MG 24 hr tablet Take 1 tablet (25 mg total) by mouth daily.    QUEtiapine (SEROQUEL) 25 MG tablet Take 25 mg by mouth at bedtime.         Procedures/Studies: Dg Chest 2 View  Result Date: 06/13/2017 CLINICAL DATA:  Status post mitral valve repair in 2014. The patient currently is experiencing respiratory difficulty. History of CHF, current smoker. EXAM: CHEST  2 VIEW COMPARISON:  Portable chest x-ray of June 11, 2017 FINDINGS: The lungs are well-expanded. The interstitial markings are less conspicuous today. Fluid in the minor fissure has resolved. There is no significant pleural effusion. The cardiac silhouette is top-normal in size. The mitral valve ring is in reasonable position. The sternal wires are intact. The pulmonary vascularity is less engorged and more distinct. The bony thorax exhibits no acute abnormality. IMPRESSION: Improving CHF. Resolution of pleural effusions. No acute pneumonia. Electronically Signed   By: David  [place] M.D.   On: 06/13/2017 07:57   Dg Chest Port 1 View  Result Date: 07/06/2017 CLINICAL DATA:  Pneumonia.  Follow-up. EXAM: PORTABLE CHEST 1 VIEW COMPARISON:  07/05/2017 FINDINGS: Previous median sternotomy and mitral valve replacement. Chronic cardiomegaly. Allowing for technical differences, there is probably radiographic improvement with less airspace density. Small amount of pleural fluid persists. The findings could be due to pulmonary edema and/or pneumonia. No worsening or new finding. IMPRESSION: Radiographic improvement since yesterday. Electronically Signed   By: Paulina Fusi M.D.   On: 07/06/2017 08:48   Dg Chest Portable 1 View  Result Date: 07/05/2017 CLINICAL DATA:  Acute onset of shortness of breath and productive cough. Initial encounter. EXAM: PORTABLE CHEST 1 VIEW COMPARISON:  Chest radiograph performed 06/13/2017 FINDINGS: The lungs are  well-aerated. Vascular congestion is noted. Right basilar and left perihilar airspace  opacification raises concern for multifocal pneumonia. Superimposed pulmonary edema cannot be excluded. A small right pleural effusion is noted. There is no evidence of pneumothorax. The cardiomediastinal silhouette is mildly enlarged. The patient is status post median sternotomy. A valve replacement is noted. No acute osseous abnormalities are seen. IMPRESSION: Vascular congestion and mild cardiomegaly. Right basilar and left perihilar airspace opacification raises concern for multifocal pneumonia. Superimposed pulmonary edema cannot be excluded. Small right pleural effusion noted. Electronically Signed   By: Roanna Raider M.D.   On: 07/05/2017 03:20   Dg Chest Portable 1 View  Result Date: 06/11/2017 CLINICAL DATA:  Respiratory distress.  Shortness of breath. EXAM: PORTABLE CHEST 1 VIEW COMPARISON:  03/23/2017 FINDINGS: Post median sternotomy with prosthetic valve. Mild cardiomegaly is unchanged from prior exam. Worsening pulmonary edema. Increasing pleural effusions with progressive fluid in the right minor fissure. No pneumothorax. IMPRESSION: Increasing pulmonary edema and pleural effusions with background cardiomegaly, consistent with CHF. Electronically Signed   By: Rubye Oaks M.D.   On: 06/11/2017 00:59      Subjective: Pt says she is feeling a lot better and would like to go home. Says daughter has something that she needs to be there for. She says that she does not feel that she is diuresing well with oral lasix dose and would like to try increasing it.    Discharge Exam: Vitals:   07/05/17 2200 07/05/17 2300  BP: (!) 114/103 103/67  Pulse: 91 80  Resp: (!) 23 (!) 33  Temp:    SpO2: 100% 99%   Vitals:   07/05/17 2002 07/05/17 2100 07/05/17 2200 07/05/17 2300  BP:  111/60 (!) 114/103 103/67  Pulse:  82 91 80  Resp:  (!) 28 (!) 23 (!) 33  Temp:      TempSrc:      SpO2: 98% 96% 100% 99%   Weight:      Height:       General: Pt is alert, awake, not in acute distress Cardiovascular: normal S1/S2 +, no rubs, no gallops Respiratory: CTA bilaterally, no wheezing, no rhonchi Abdominal: Soft, NT, ND, bowel sounds + Extremities: trace pretibial edema, no cyanosis   The results of significant diagnostics from this hospitalization (including imaging, microbiology, ancillary and laboratory) are listed below for reference.     Microbiology: Recent Results (from the past 240 hour(s))  MRSA PCR Screening     Status: None   Collection Time: 07/05/17  6:05 AM  Result Value Ref Range Status   MRSA by PCR NEGATIVE NEGATIVE Final    Comment:        The GeneXpert MRSA Assay (FDA approved for NASAL specimens only), is one component of a comprehensive MRSA colonization surveillance program. It is not intended to diagnose MRSA infection nor to guide or monitor treatment for MRSA infections.      Labs: BNP (last 3 results)  Recent Labs  03/23/17 0138 06/11/17 0100 07/05/17 0226  BNP 214.0* 323.0* 228.0*   Basic Metabolic Panel:  Recent Labs Lab 07/05/17 0226 07/05/17 0613 07/06/17 0521  NA 140  --  136  K 3.6  --  3.3*  CL 107  --  104  CO2 24  --  25  GLUCOSE 141*  --  103*  BUN 13  --  14  CREATININE 0.91 0.87 0.82  CALCIUM 9.0  --  8.2*   Liver Function Tests:  Recent Labs Lab 07/06/17 0521  AST 11*  ALT 9*  ALKPHOS 51  BILITOT  0.9  PROT 6.6  ALBUMIN 3.6   No results for input(s): LIPASE, AMYLASE in the last 168 hours. No results for input(s): AMMONIA in the last 168 hours. CBC:  Recent Labs Lab 07/05/17 0226 07/05/17 0613 07/06/17 0521  WBC 15.3* 14.9* 9.8  NEUTROABS 12.6*  --   --   HGB 10.0* 9.0* 8.7*  HCT 31.3* 28.3* 28.1*  MCV 80.7 79.9 80.5  PLT 290 244 255   Cardiac Enzymes:  Recent Labs Lab 07/05/17 0226 07/05/17 0613 07/05/17 1232 07/05/17 1758  TROPONINI <0.03 <0.03 <0.03 <0.03   BNP: Invalid input(s):  POCBNP CBG: No results for input(s): GLUCAP in the last 168 hours. D-Dimer No results for input(s): DDIMER in the last 72 hours. Hgb A1c No results for input(s): HGBA1C in the last 72 hours. Lipid Profile No results for input(s): CHOL, HDL, LDLCALC, TRIG, CHOLHDL, LDLDIRECT in the last 72 hours. Thyroid function studies No results for input(s): TSH, T4TOTAL, T3FREE, THYROIDAB in the last 72 hours.  Invalid input(s): FREET3 Anemia work up No results for input(s): VITAMINB12, FOLATE, FERRITIN, TIBC, IRON, RETICCTPCT in the last 72 hours. Urinalysis    Component Value Date/Time   COLORURINE YELLOW 05/23/2016 2027   APPEARANCEUR CLEAR 05/23/2016 2027   LABSPEC <1.005 (L) 05/23/2016 2027   PHURINE 6.5 05/23/2016 2027   GLUCOSEU NEGATIVE 05/23/2016 2027   HGBUR NEGATIVE 05/23/2016 2027   BILIRUBINUR NEGATIVE 05/23/2016 2027   KETONESUR NEGATIVE 05/23/2016 2027   PROTEINUR NEGATIVE 05/23/2016 2027   NITRITE NEGATIVE 05/23/2016 2027   LEUKOCYTESUR TRACE (A) 05/23/2016 2027   Sepsis Labs Invalid input(s): PROCALCITONIN,  WBC,  LACTICIDVEN Microbiology Recent Results (from the past 240 hour(s))  MRSA PCR Screening     Status: None   Collection Time: 07/05/17  6:05 AM  Result Value Ref Range Status   MRSA by PCR NEGATIVE NEGATIVE Final    Comment:        The GeneXpert MRSA Assay (FDA approved for NASAL specimens only), is one component of a comprehensive MRSA colonization surveillance program. It is not intended to diagnose MRSA infection nor to guide or monitor treatment for MRSA infections.    Time coordinating discharge: 34 mins  SIGNED:  Standley Dakins, MD  [facility] 07/06/2017, 9:02 AM Pager [phone]  If 7PM-7AM, please contact night-coverage [email or web address] Password [email or web address]

## 2017-07-06 NOTE — Care Management Note (Signed)
Case Management Note  Patient Details  Name: Theresa Crawford MRN: [id number] Date of Birth: [birth year]  Subjective/Objective:        Pt is from home, she's ind with ADL's. She has PCP and cardiologist. She has transportation and insurance with drug coverage. She has scales and reports compliance with diet. Acutely on oxygen.             Action/Plan:CM following for needs. ? o2 need.   Expected Discharge Date:  07/06/17               Expected Discharge Plan:     In-House Referral:     Discharge planning Services  CM Consult  Post Acute Care Choice:    Choice offered to:     DME Arranged:    DME Agency:     HH Arranged:    HH Agency:     Status of Service:  In process, will continue to follow  If discussed at Long Length of Stay Meetings, dates discussed:    Additional Comments:  Charmain Diosdado, Chrystine OilerSharley Diane, RN 07/06/2017, 9:00 AM

## 2017-08-14 ENCOUNTER — Telehealth: Payer: Self-pay | Admitting: Physician Assistant

## 2017-08-14 DIAGNOSIS — I5033 Acute on chronic diastolic (congestive) heart failure: Secondary | ICD-10-CM

## 2017-08-14 MED ORDER — POTASSIUM CHLORIDE CRYS ER 20 MEQ PO TBCR: 40.0000 meq | EXTENDED_RELEASE_TABLET | Freq: Every day | ORAL | 0 refills | Status: DC

## 2017-08-14 MED ORDER — FUROSEMIDE 80 MG PO TABS: 80.0000 mg | ORAL_TABLET | Freq: Every day | ORAL | 0 refills | Status: DC

## 2017-08-14 NOTE — Telephone Encounter (Signed)
Pt called stating she was out of lasix and potassium. Electronically sent both scripts to [facility] near her home. She agreed with this plan.

## 2017-08-17 ENCOUNTER — Ambulatory Visit: Payer: Medicaid Other | Admitting: Physician Assistant

## 2017-08-17 NOTE — Progress Notes (Deleted)
Cardiology Office Note    Date:  08/17/2017   ID:  Theresa Crawford, DOB Sep 12, 1981, MRN [id number]  PCP:  Jerrell BelfastHouse, [name], [name]  Cardiologist: Dr. Purvis SheffieldKoneswaran  No chief complaint on file.   History of Present Illness:  Theresa Crawford is a 36 y.o. female with history of diastolic CHF, peripartum pericarditis and subsequent mitral valve repair 2014 in [facility] [place] now with severe mitral stenosis, hypertension, palpitations.  She was hospitalized 06/13/17 with acute on chronic diastolic CHF treated with IV Lasix.  2D echo showed normal LV function with severe mitral stenosis.  She had another hospitalization was discharged 07/06/17 with CHF as well as pneumonia treated with IV Lasix and IV levofloxacin.           Past Medical History:  Diagnosis Date  . Chronic diastolic CHF (congestive heart failure) (HCC)   . History of open heart surgery   . Hypertension   . Mitral valve disease    mitral regurgitation  . Normocytic anemia   . Obesity   . Premature atrial contractions   . Pulmonary hypertension (HCC) 06/12/2017  . PVC's (premature ventricular contractions)    a. h/o palpitations with event monitor in 03/2017 showing NSR with PACs/PVCs.    Past Surgical History:  Procedure Laterality Date  . ABDOMINAL HYSTERECTOMY    . CESAREAN SECTION     X's 2  . MITRAL VALVE REPAIR  03/2013    [facility] [place]   . PARTIAL HYSTERECTOMY  2011   PID w/problem with fallopian tubes  . TEE WITHOUT CARDIOVERSION N/A 05/26/2016   Procedure: TRANSESOPHAGEAL ECHOCARDIOGRAM (TEE);  Surgeon: Laqueta LindenSuresh A Koneswaran, MD;  [facility];  Service: Cardiovascular;  Laterality: N/A;    Current Medications: No outpatient medications have been marked as taking for the 08/17/17 encounter (Appointment) with Dyann KiefLenze, Lary Eckardt M, PA-C.     Allergies:   Lisinopril; Penicillins; Bee venom; Contrast media [iodinated diagnostic agents]; and Shellfish  allergy   Social History   Socioeconomic History  . Marital status: Single    Spouse name: Not on file  . Number of children: Not on file  . Years of education: Not on file  . Highest education level: Not on file  Social Needs  . Financial resource strain: Not on file  . Food insecurity - worry: Not on file  . Food insecurity - inability: Not on file  . Transportation needs - medical: Not on file  . Transportation needs - non-medical: Not on file  Occupational History  . Not on file  Tobacco Use  . Smoking status: Current Some Day Smoker    Packs/day: 0.25    Types: Cigarettes    Start date: 11/03/1999  . Smokeless tobacco: Never Used  Substance and Sexual Activity  . Alcohol use: No    Alcohol/week: 0.0 oz  . Drug use: No  . Sexual activity: Not on file  Other Topics Concern  . Not on file  Social History Narrative  . Not on file     Family History:  The patient's   family history includes Heart disease in her paternal grandfather; Heart failure in her father.   ROS:   Please see the history of present illness.    ROS All other systems reviewed and are negative.   PHYSICAL EXAM:   VS:  There were no vitals taken for this visit.  Physical Exam  GEN: Well nourished, well developed, in no acute  distress  HEENT: normal  Neck: no JVD, carotid bruits, or masses Cardiac:RRR; no murmurs, rubs, or gallops  Respiratory:  clear to auscultation bilaterally, normal work of breathing GI: soft, nontender, nondistended, + BS Ext: without cyanosis, clubbing, or edema, Good distal pulses bilaterally MS: no deformity or atrophy  Skin: warm and dry, no rash Neuro:  Alert and Oriented x 3, Strength and sensation are intact Psych: euthymic mood, full affect  Wt Readings from Last 3 Encounters:  07/05/17 225 lb 12 oz (102.4 kg)  06/13/17 231 lb 12.8 oz (105.1 kg)  05/03/17 229 lb (103.9 kg)      Studies/Labs Reviewed:   EKG:  EKG is*** ordered today.  The ekg ordered today  demonstrates ***  Recent Labs: 06/11/2017: TSH 2.139 06/12/2017: Magnesium 2.0 07/05/2017: B Natriuretic Peptide 228.0 07/06/2017: ALT 9; BUN 14; Creatinine, Ser 0.82; Hemoglobin 8.7; Platelets 255; Potassium 3.3; Sodium 136   Lipid Panel No results found for: CHOL, TRIG, HDL, CHOLHDL, VLDL, LDLCALC, LDLDIRECT  Additional studies/ records that were reviewed today include:   Echocardiogram 06/11/2017 Left ventricle: The cavity size was normal. Systolic function was   normal. The estimated ejection fraction was in the range of 60%   to 65%. Wall motion was normal; there were no regional wall   motion abnormalities. - Aortic valve: Valve area (VTI): 3.35 cm^2. Valve area (Vmax): 3.3   cm^2. Valve area (Vmean): 3.56 cm^2. - Mitral valve: The anterior MV is thickened with hockeystick   appearnce and some doming. Evidence of a prior repair noted with   no regurgitation but what appears to be severe stenosis present.   The submital structures appear moderately thickened. Prior   procedures included surgical repair. An annular ring prosthesis   was present. Mobility was mildly restricted. Transvalvular   velocity was increased. The findings are consistent with severe   stenosis. Valve area by continuity equation (using LVOT flow):   0.69 cm^2. - Left atrium: The atrium was moderately dilated. - Right atrium: The atrium was mildly dilated. - Tricuspid valve: There was mild-moderate regurgitation. - Pulmonary arteries: Systolic pressure was moderately increased.   PA peak pressure: 51 mm Hg (S).    TEE 05/26/2016 Left ventricle: Systolic function was normal. The estimated   ejection fraction was in the range of 55% to 60%. Wall motion was   normal; there were no regional wall motion abnormalities. - Mitral valve: Mitral annular ring present. Mild to moderate   stenosis present. There was mild regurgitation. Mean velocity   (D): 106 cm/s. Mean gradient (D): 5 mm Hg. Valve area by pressure    half-time: 2.75 cm^2. Indexed valve area by pressure half-time:   1.26 cm^2/m^2. - Left atrium: The atrium was moderately dilated. - Right ventricle: Systolic function was mildly reduced. - Right atrium: The atrium was mildly dilated. - Atrial septum: No defect or patent foramen ovale was identified. - Tricuspid valve: There was mild regurgitation. - Pulmonary arteries: PA peak pressure: 32 mm Hg (S).         ASSESSMENT:    1. Mitral valve stenosis, severe   2. Acute on chronic diastolic CHF (congestive heart failure) (HCC)   3. Essential hypertension      PLAN:  In order of problems listed above:   Severe mitral stenosis on most recent echo.  TEE 05/2016 showed only mild to moderate stenosis mean gradient 5 mmHg see above for details  Chronic diastolic CHF with 5 hospitalizations this year for such  Medication Adjustments/Labs and Tests Ordered: Current medicines are reviewed at length with the patient today.  Concerns regarding medicines are outlined above.  Medication changes, Labs and Tests ordered today are listed in the Patient Instructions below. There are no Patient Instructions on file for this visit.   Elson ClanSigned, Janiylah Hannis, PA-C  08/17/2017 12:17 PM    [facility] [address], [place], [place]  [address] Phone: [phone](336) [phone]; Fax: [phone]

## 2017-08-19 ENCOUNTER — Ambulatory Visit: Payer: Medicaid Other | Admitting: Adult Health

## 2017-09-05 NOTE — Progress Notes (Signed)
Cardiology Office Note   Date:  09/08/2017   ID:  Theresa DaneLatasha Y Crawford, DOB 12-18-1980, MRN [id number]  PCP:  Jerrell BelfastHouse, [name], [name]  Cardiologist: Dina RichJonathan Branch, MD Chief Complaint  Patient presents with  . Congestive Heart Failure  . Hypertension     History of Present Illness: Theresa Crawford is a 36 y.o. female who presents for ongoing assessment and management of chronic diastolic heart failure, mitral valve repair in 2014, hypertension, palpitations, who we saw last on consultation in October 2018 in the setting of respiratory distress and dyspnea.  She was started on Lasix 80 mg p.o. daily and taken off of Bumex.  She comes today stating that the Lasix does not work as well as Bumex that she had been on in the past.  She is feeling swelling in her hands and abdomen.  She admits to dietary noncompliance eating ham and dressing over the holidays.  She states normally she does not have any dietary noncompliance as she is been much stricter on salt intake.  The patient says that she did have one syncopal episode while preparing Thanksgiving dinner and making rice crispy treats in November.  EMS was called.  Denies caffeine intake or medical noncompliance.  She has not had any further episodes since November concerning syncope.  Past Medical History:  Diagnosis Date  . Chronic diastolic CHF (congestive heart failure) (HCC)   . History of open heart surgery   . Hypertension   . Mitral valve disease    mitral regurgitation  . Normocytic anemia   . Obesity   . Premature atrial contractions   . Pulmonary hypertension (HCC) 06/12/2017  . PVC's (premature ventricular contractions)    a. h/o palpitations with event monitor in 03/2017 showing NSR with PACs/PVCs.    Past Surgical History:  Procedure Laterality Date  . ABDOMINAL HYSTERECTOMY    . CESAREAN SECTION     X's 2  . MITRAL VALVE REPAIR  03/2013    [facility] [place]   . PARTIAL HYSTERECTOMY  2011   PID  w/problem with fallopian tubes  . TEE WITHOUT CARDIOVERSION N/A 05/26/2016   Procedure: TRANSESOPHAGEAL ECHOCARDIOGRAM (TEE);  Surgeon: Laqueta LindenSuresh A Koneswaran, MD;  Location: [facility];  Service: Cardiovascular;  Laterality: N/A;     Current Outpatient Medications  Medication Sig Dispense Refill  . citalopram (CELEXA) 40 MG tablet Take 40 mg by mouth at bedtime.     . docusate sodium (COLACE) 100 MG capsule Take 100 mg by mouth daily.    . ferrous sulfate 325 (65 FE) MG tablet Take 325 mg by mouth 2 (two) times daily with a meal.    . furosemide (LASIX) 80 MG tablet Take 1 tablet (80 mg total) by mouth daily. 30 tablet 0  . metoprolol succinate (TOPROL-XL) 25 MG 24 hr tablet Take 1 tablet (25 mg total) by mouth daily.    . potassium chloride SA (K-DUR,KLOR-CON) 20 MEQ tablet Take 2 tablets (40 mEq total) by mouth daily. 60 tablet 0  . QUEtiapine (SEROQUEL) 25 MG tablet Take 25 mg by mouth at bedtime.      No current facility-administered medications for this visit.     Allergies:   Lisinopril; Penicillins; Bee venom; Contrast media [iodinated diagnostic agents]; and Shellfish allergy    Social History:  The patient  reports that she has been smoking cigarettes.  She started smoking about 17 years ago. She has been smoking about 0.25 packs per day. she has  never used smokeless tobacco. She reports that she does not drink alcohol or use drugs.   Family History:  The patient's family history includes Heart disease in her paternal grandfather; Heart failure in her father.    ROS: All other systems are reviewed and negative. Unless otherwise mentioned in H&P    PHYSICAL EXAM: VS:  Ht 5\' 4"  (1.626 m)   Wt 231 lb (104.8 kg)   BMI 39.65 kg/m  , BMI Body mass index is 39.65 kg/m. GEN: Well nourished, well developed, in no acute distress  HEENT: normal  Neck: no JVD, carotid bruits, or masses Cardiac: RRR; tachycardic, no murmurs, rubs, or gallops,no edema  Respiratory:  clear to  auscultation bilaterally, normal work of breathing GI: soft, nontender, nondistended, + BS MS: no deformity or atrophy  Skin: warm and dry, no rash Neuro:  Strength and sensation are intact Psych: euthymic mood, full affect   Recent Labs: 06-13-17: TSH 2.139 06/12/2017: Magnesium 2.0 07/05/2017: B Natriuretic Peptide 228.0 07/06/2017: ALT 9; BUN 14; Creatinine, Ser 0.82; Hemoglobin 8.7; Platelets 255; Potassium 3.3; Sodium 136    Lipid Panel No results found for: CHOL, TRIG, HDL, CHOLHDL, VLDL, LDLCALC, LDLDIRECT    Wt Readings from Last 3 Encounters:  09/08/17 231 lb (104.8 kg)  07/05/17 225 lb 12 oz (102.4 kg)  06/13/17 231 lb 12.8 oz (105.1 kg)      Other studies Reviewed: Echocardiogram 06/13/2017 Left ventricle: The cavity size was normal. Systolic function was   normal. The estimated ejection fraction was in the range of 60%   to 65%. Wall motion was normal; there were no regional wall   motion abnormalities. - Aortic valve: Valve area (VTI): 3.35 cm^2. Valve area (Vmax): 3.3   cm^2. Valve area (Vmean): 3.56 cm^2. - Mitral valve: The anterior MV is thickened with hockeystick   appearnce and some doming. Evidence of a prior repair noted with   no regurgitation but what appears to be severe stenosis present.   The submital structures appear moderately thickened. Prior   procedures included surgical repair. An annular ring prosthesis   was present. Mobility was mildly restricted. Transvalvular   velocity was increased. The findings are consistent with severe   stenosis. Valve area by continuity equation (using LVOT flow):   0.69 cm^2. - Left atrium: The atrium was moderately dilated. - Right atrium: The atrium was mildly dilated. - Tricuspid valve: There was mild-moderate regurgitation. - Pulmonary arteries: Systolic pressure was moderately increased.   PA peak pressure: 51 mm Hg (S).    ASSESSMENT AND PLAN:  1.  Chronic diastolic heart failure: Had admissions  for decompensation with pulmonary edema.  She had been diuresed and changed from Bumex to Lasix 80 mg daily.  She states that the Lasix is not keeping her swelling down.  She also admits to occasional dietary noncompliance.  She requests to go back to Bumex.  I am going to change her back to Bumex 2 mg twice daily from 1 mg twice daily due to frequent decompensations.  Follow-up BMET in 1 week.  He is again counseled on low-sodium diet.  2.  Hypertension: Pressure is controlled.  Heart rate is elevated somewhat.  She is on metoprolol and states she did take it today.  Will follow in 1 month for ongoing evaluation and management.  3.  Syncopal episode: She did not go to the emergency room.  EMS did come by and check her and she had regained consciousness.  Uncertain etiology of this.  Follow-up labs ordered.   Current medicines are reviewed at length with the patient today.    Labs/ tests ordered today include: BMET Bettey MareKathryn M. Liborio NixonLawrence DNP, ANP, AACC   09/08/2017 1:32 PM     [facility] [address], [place], [place] [address] Phone: [phone]; Fax: [phone]) [phone]

## 2017-09-07 IMAGING — DX DG CHEST 2V
2 series · 2 of 2 positions shown · non-contrast
Comparison: 11/20/2015

CLINICAL DATA: Left-sided chest pain since last night.

EXAM:
CHEST  2 VIEW

[chest pa]
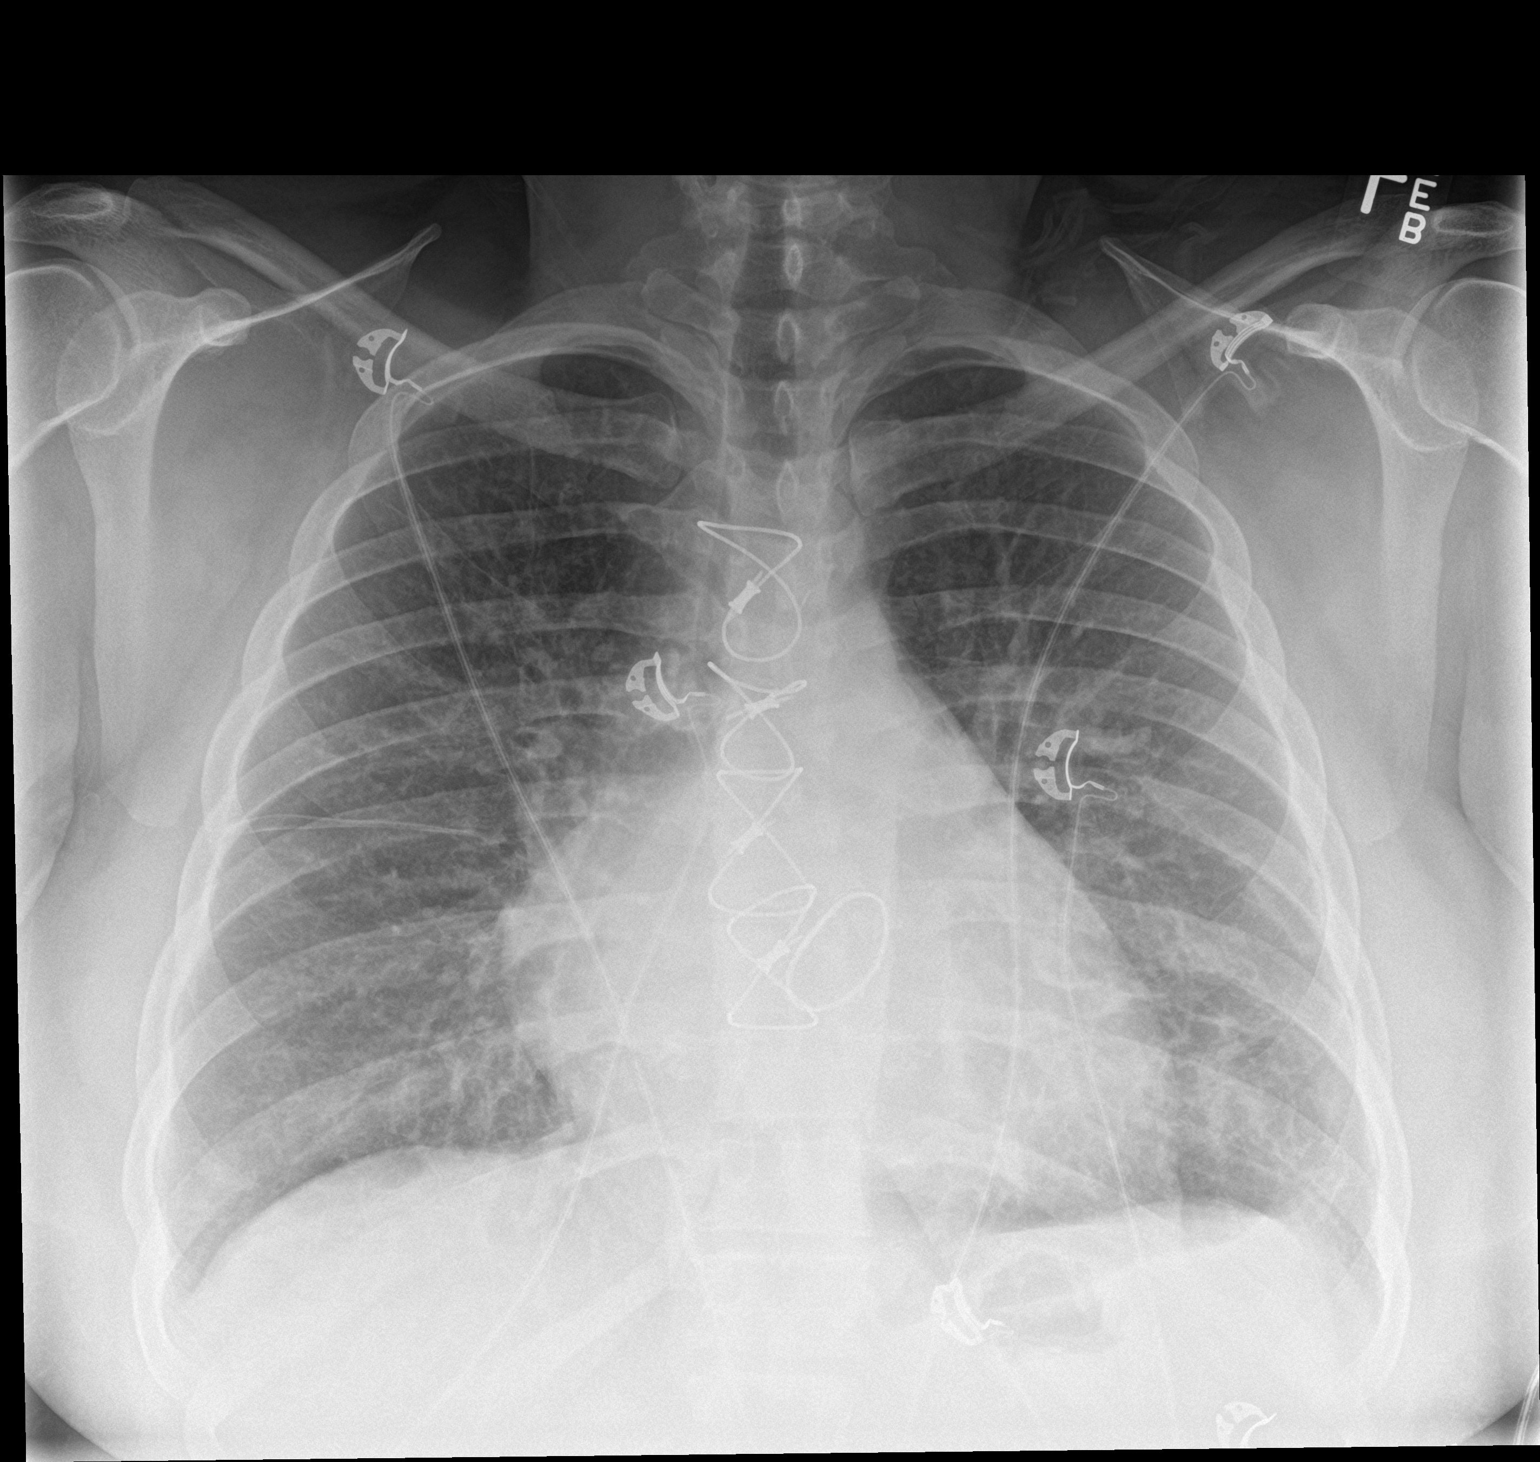

[chest lat]
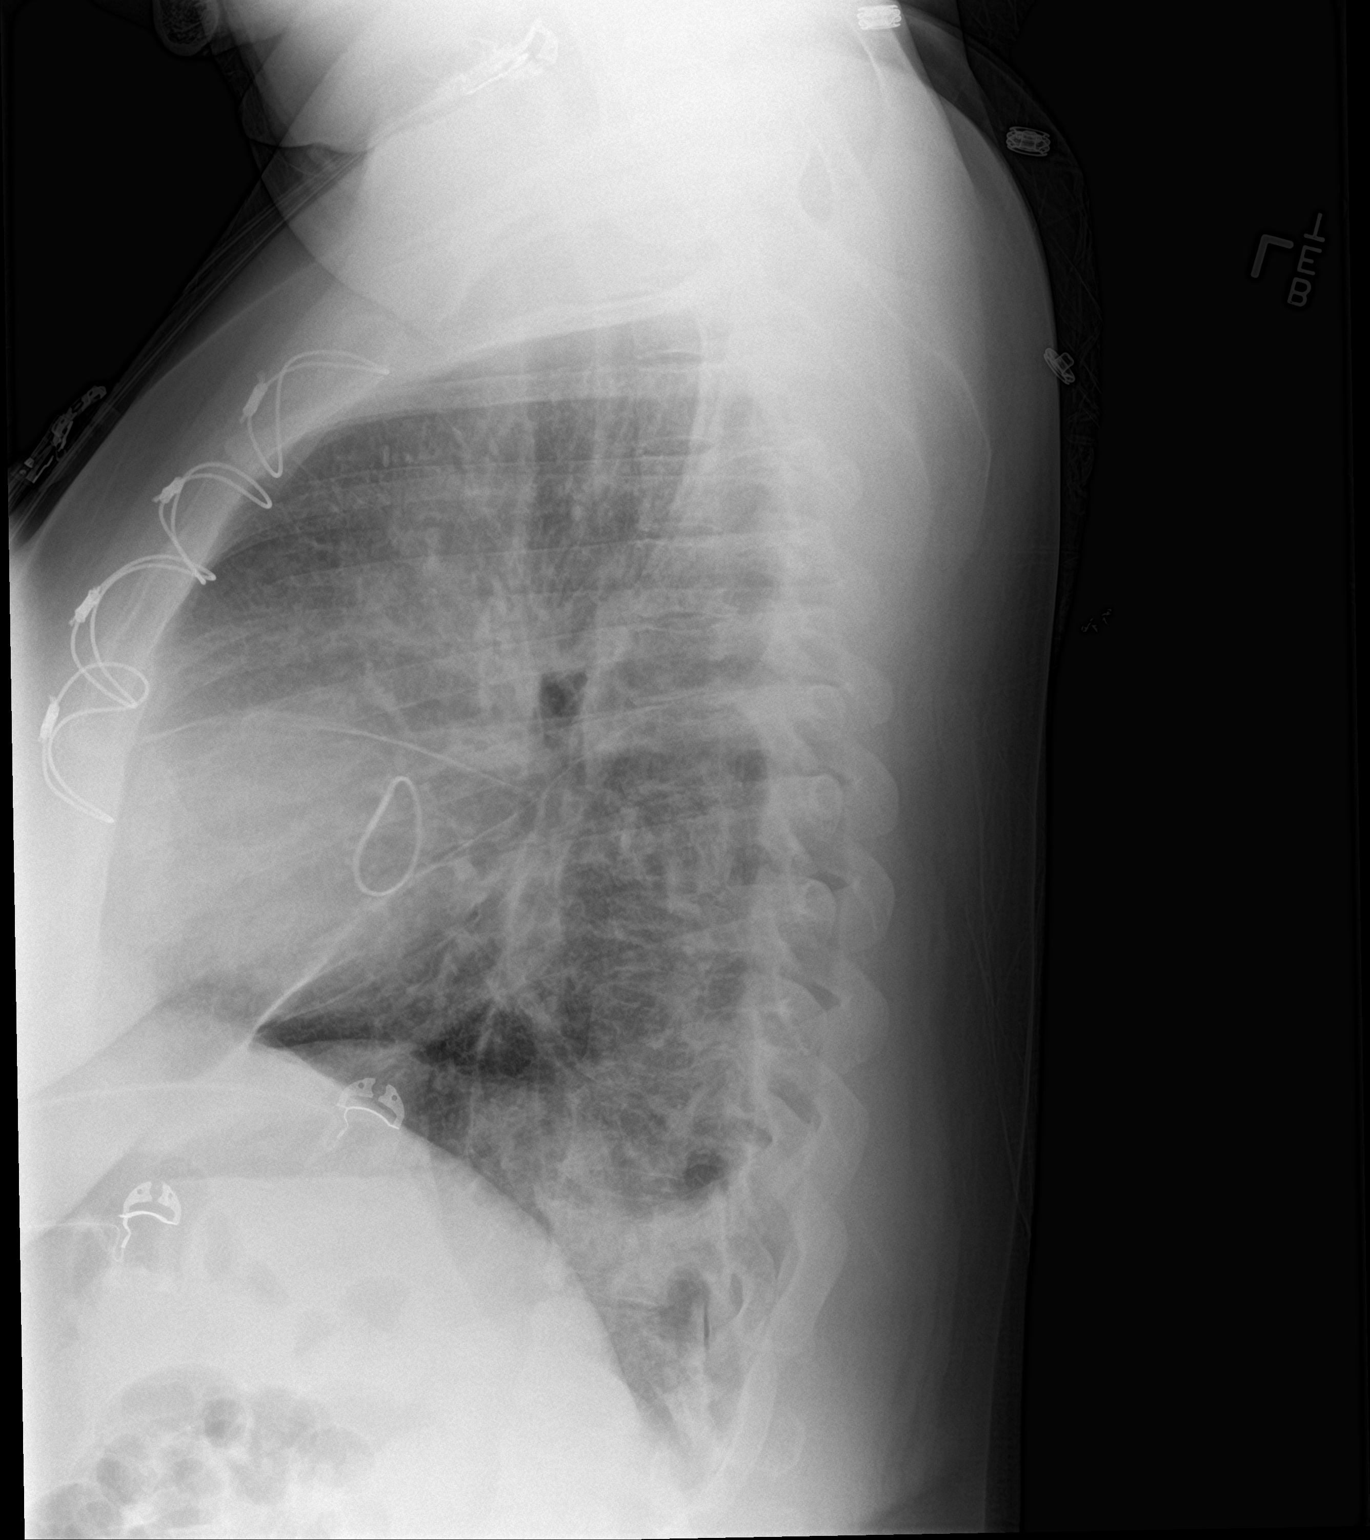

[2 of 2 positions shown; findings below may reference images not displayed]

FINDINGS: Sternotomy wires prosthetic mitral valve are present. Lungs are
adequately inflated and demonstrate mild prominence of the perihilar
markings suggesting minimal vascular congestion. Mild stable
cardiomegaly. Remainder of the exam is unchanged.
IMPRESSION: Mild cardiomegaly with minimal vascular congestion.

## 2017-09-08 ENCOUNTER — Encounter: Payer: Self-pay | Admitting: Adult Health

## 2017-09-08 ENCOUNTER — Ambulatory Visit (INDEPENDENT_AMBULATORY_CARE_PROVIDER_SITE_OTHER): Payer: Medicaid Other | Admitting: Adult Health

## 2017-09-08 VITALS — BP 112/72 | HR 87 | Ht 64.0 in | Wt 231.0 lb

## 2017-09-08 DIAGNOSIS — I10 Essential (primary) hypertension: Secondary | ICD-10-CM | POA: Diagnosis not present

## 2017-09-08 DIAGNOSIS — I5032 Chronic diastolic (congestive) heart failure: Secondary | ICD-10-CM

## 2017-09-08 DIAGNOSIS — Z79899 Other long term (current) drug therapy: Secondary | ICD-10-CM | POA: Diagnosis not present

## 2017-09-08 MED ORDER — BUMETANIDE 2 MG PO TABS: 2.0000 mg | ORAL_TABLET | Freq: Two times a day (BID) | ORAL | 11 refills | Status: DC

## 2017-09-08 NOTE — Patient Instructions (Signed)
Medication Instructions:  Your physician has recommended you make the following change in your medication:  Stop Taking Lasix  Start Taking Bumex 2 mg Two Times Daily    Labwork: Your physician recommends that you return for lab work in: 1 Week    Testing/Procedures: NONE   Follow-Up: Your physician recommends that you schedule a follow-up appointment in: 1 Month    Any Other Special Instructions Will Be Listed Below (If Applicable).     If you need a refill on your cardiac medications before your next appointment, please call your pharmacy. Thank you for choosing [facility]!

## 2017-09-10 ENCOUNTER — Other Ambulatory Visit: Payer: Self-pay

## 2017-09-10 ENCOUNTER — Encounter (HOSPITAL_COMMUNITY): Payer: Self-pay

## 2017-09-10 ENCOUNTER — Inpatient Hospital Stay (HOSPITAL_COMMUNITY)
Admission: EM | Admit: 2017-09-10 | Discharge: 2017-09-11 | DRG: 293 | Disposition: A | Discharge status: Home / Self Care | Payer: Medicaid Other | Attending: Internal Medicine | Admitting: Internal Medicine

## 2017-09-10 ENCOUNTER — Emergency Department (HOSPITAL_COMMUNITY): Payer: Medicaid Other

## 2017-09-10 ENCOUNTER — Inpatient Hospital Stay (HOSPITAL_COMMUNITY): Payer: Medicaid Other

## 2017-09-10 DIAGNOSIS — Z6839 Body mass index (BMI) 39.0-39.9, adult: Secondary | ICD-10-CM | POA: Diagnosis not present

## 2017-09-10 DIAGNOSIS — I5033 Acute on chronic diastolic (congestive) heart failure: Secondary | ICD-10-CM | POA: Diagnosis present

## 2017-09-10 DIAGNOSIS — I272 Pulmonary hypertension, unspecified: Secondary | ICD-10-CM | POA: Diagnosis present

## 2017-09-10 DIAGNOSIS — E876 Hypokalemia: Secondary | ICD-10-CM

## 2017-09-10 DIAGNOSIS — F17210 Nicotine dependence, cigarettes, uncomplicated: Secondary | ICD-10-CM | POA: Diagnosis present

## 2017-09-10 DIAGNOSIS — E669 Obesity, unspecified: Secondary | ICD-10-CM | POA: Diagnosis present

## 2017-09-10 DIAGNOSIS — Z91041 Radiographic dye allergy status: Secondary | ICD-10-CM

## 2017-09-10 DIAGNOSIS — D649 Anemia, unspecified: Secondary | ICD-10-CM | POA: Diagnosis not present

## 2017-09-10 DIAGNOSIS — R9431 Abnormal electrocardiogram [ECG] [EKG]: Secondary | ICD-10-CM | POA: Diagnosis not present

## 2017-09-10 DIAGNOSIS — Z79899 Other long term (current) drug therapy: Secondary | ICD-10-CM

## 2017-09-10 DIAGNOSIS — I052 Rheumatic mitral stenosis with insufficiency: Secondary | ICD-10-CM | POA: Diagnosis present

## 2017-09-10 DIAGNOSIS — D509 Iron deficiency anemia, unspecified: Secondary | ICD-10-CM | POA: Diagnosis present

## 2017-09-10 DIAGNOSIS — I110 Hypertensive heart disease with heart failure: Principal | ICD-10-CM | POA: Diagnosis present

## 2017-09-10 DIAGNOSIS — D508 Other iron deficiency anemias: Secondary | ICD-10-CM

## 2017-09-10 DIAGNOSIS — I10 Essential (primary) hypertension: Secondary | ICD-10-CM | POA: Diagnosis not present

## 2017-09-10 DIAGNOSIS — Z952 Presence of prosthetic heart valve: Secondary | ICD-10-CM

## 2017-09-10 DIAGNOSIS — Z888 Allergy status to other drugs, medicaments and biological substances status: Secondary | ICD-10-CM | POA: Diagnosis not present

## 2017-09-10 DIAGNOSIS — Z880 Allergy status to penicillin: Secondary | ICD-10-CM

## 2017-09-10 DIAGNOSIS — F418 Other specified anxiety disorders: Secondary | ICD-10-CM | POA: Diagnosis present

## 2017-09-10 DIAGNOSIS — I050 Rheumatic mitral stenosis: Secondary | ICD-10-CM | POA: Diagnosis present

## 2017-09-10 LAB — BASIC METABOLIC PANEL
ANION GAP: 10 (ref 5–15)
BUN: 11 mg/dL (ref 6–20)
CO2: 26 mmol/L (ref 22–32)
Calcium: 8.9 mg/dL (ref 8.9–10.3)
Chloride: 105 mmol/L (ref 101–111)
Creatinine, Ser: 0.86 mg/dL (ref 0.44–1.00)
GLUCOSE: 92 mg/dL (ref 65–99)
POTASSIUM: 2.7 mmol/L — AB (ref 3.5–5.1)
SODIUM: 141 mmol/L (ref 135–145)

## 2017-09-10 LAB — TROPONIN I
Troponin I: <0.03 ng/mL (ref <0.03)
Troponin I: <0.03 ng/mL (ref <0.03)

## 2017-09-10 LAB — CBC WITH DIFFERENTIAL/PLATELET
BASOS ABS: 0 10*3/uL (ref 0.0–0.1)
BASOS PCT: 0 %
EOS ABS: 0.1 10*3/uL (ref 0.0–0.7)
Eosinophils Relative: 1 %
HCT: 33 % — ABNORMAL LOW (ref 36.0–46.0)
HEMOGLOBIN: 10.3 g/dL — AB (ref 12.0–15.0)
Lymphocytes Relative: 11 %
Lymphs Abs: 1.6 10*3/uL (ref 0.7–4.0)
MCH: 24.9 pg — ABNORMAL LOW (ref 26.0–34.0)
MCHC: 31.2 g/dL (ref 30.0–36.0)
MCV: 79.7 fL (ref 78.0–100.0)
Monocytes Absolute: 0.5 10*3/uL (ref 0.1–1.0)
Monocytes Relative: 4 %
NEUTROS PCT: 84 %
Neutro Abs: 11.7 10*3/uL — ABNORMAL HIGH (ref 1.7–7.7)
Platelets: 313 10*3/uL (ref 150–400)
RBC: 4.14 MIL/uL (ref 3.87–5.11)
RDW: 16.9 % — ABNORMAL HIGH (ref 11.5–15.5)
WBC: 13.9 10*3/uL — AB (ref 4.0–10.5)

## 2017-09-10 LAB — ECHOCARDIOGRAM COMPLETE
Height: 64 in
WEIGHTICAEL: 3619.07 [oz_av]

## 2017-09-10 LAB — POTASSIUM: Potassium: 3.8 mmol/L (ref 3.5–5.1)

## 2017-09-10 LAB — MAGNESIUM: Magnesium: 2.6 mg/dL — ABNORMAL HIGH (ref 1.7–2.4)

## 2017-09-10 LAB — BRAIN NATRIURETIC PEPTIDE: B NATRIURETIC PEPTIDE 5: 384 pg/mL — AB (ref 0.0–100.0)

## 2017-09-10 MED ORDER — METOPROLOL TARTRATE 25 MG PO TABS
12.5000 mg | ORAL_TABLET | Freq: Two times a day (BID) | ORAL | Status: DC
  Administered 2017-09-10 – 2017-09-11 (×3): 12.5 mg via ORAL
  Filled 2017-09-10 (×4): qty 1

## 2017-09-10 MED ORDER — ONDANSETRON HCL 4 MG/2ML IJ SOLN: 4.0000 mg | Freq: Four times a day (QID) | INTRAMUSCULAR | Status: DC | PRN

## 2017-09-10 MED ORDER — ASPIRIN 81 MG PO CHEW: 324.0000 mg | CHEWABLE_TABLET | Freq: Once | ORAL | Status: DC

## 2017-09-10 MED ORDER — POTASSIUM CHLORIDE CRYS ER 20 MEQ PO TBCR
40.0000 meq | EXTENDED_RELEASE_TABLET | Freq: Every day | ORAL | Status: DC
  Administered 2017-09-10 – 2017-09-11 (×2): 40 meq via ORAL
  Filled 2017-09-10: qty 4
  Filled 2017-09-10 (×2): qty 2

## 2017-09-10 MED ORDER — MAGNESIUM SULFATE 2 GM/50ML IV SOLN
2.0000 g | Freq: Once | INTRAVENOUS | Status: AC
  Administered 2017-09-10: 2 g via INTRAVENOUS
  Filled 2017-09-10: qty 50

## 2017-09-10 MED ORDER — DOCUSATE SODIUM 100 MG PO CAPS
100.0000 mg | ORAL_CAPSULE | Freq: Every day | ORAL | Status: DC
  Administered 2017-09-10 – 2017-09-11 (×2): 100 mg via ORAL
  Filled 2017-09-10 (×2): qty 1

## 2017-09-10 MED ORDER — SODIUM CHLORIDE 0.9% FLUSH: 3.0000 mL | INTRAVENOUS | Status: DC | PRN

## 2017-09-10 MED ORDER — ALPRAZOLAM 0.25 MG PO TABS: 0.2500 mg | ORAL_TABLET | Freq: Two times a day (BID) | ORAL | Status: DC | PRN

## 2017-09-10 MED ORDER — FUROSEMIDE 10 MG/ML IJ SOLN
80.0000 mg | Freq: Once | INTRAMUSCULAR | Status: AC
  Administered 2017-09-10: 80 mg via INTRAVENOUS
  Filled 2017-09-10: qty 8

## 2017-09-10 MED ORDER — POTASSIUM CHLORIDE CRYS ER 20 MEQ PO TBCR
40.0000 meq | EXTENDED_RELEASE_TABLET | Freq: Once | ORAL | Status: AC
  Administered 2017-09-10: 40 meq via ORAL
  Filled 2017-09-10: qty 2

## 2017-09-10 MED ORDER — QUETIAPINE FUMARATE 25 MG PO TABS
25.0000 mg | ORAL_TABLET | Freq: Every day | ORAL | Status: DC
  Administered 2017-09-10: 25 mg via ORAL
  Filled 2017-09-10: qty 1

## 2017-09-10 MED ORDER — FUROSEMIDE 10 MG/ML IJ SOLN
80.0000 mg | Freq: Two times a day (BID) | INTRAMUSCULAR | Status: DC
  Administered 2017-09-10 – 2017-09-11 (×3): 80 mg via INTRAVENOUS
  Filled 2017-09-10 (×3): qty 8

## 2017-09-10 MED ORDER — POTASSIUM CHLORIDE 10 MEQ/100ML IV SOLN
10.0000 meq | INTRAVENOUS | Status: AC
  Administered 2017-09-10 (×2): 10 meq via INTRAVENOUS
  Filled 2017-09-10 (×2): qty 100

## 2017-09-10 MED ORDER — FERROUS SULFATE 325 (65 FE) MG PO TABS
325.0000 mg | ORAL_TABLET | Freq: Two times a day (BID) | ORAL | Status: DC
  Administered 2017-09-10 – 2017-09-11 (×3): 325 mg via ORAL
  Filled 2017-09-10 (×3): qty 1

## 2017-09-10 MED ORDER — SODIUM CHLORIDE 0.9% FLUSH
3.0000 mL | Freq: Two times a day (BID) | INTRAVENOUS | Status: DC
  Administered 2017-09-10 – 2017-09-11 (×4): 3 mL via INTRAVENOUS

## 2017-09-10 MED ORDER — CITALOPRAM HYDROBROMIDE 40 MG PO TABS: 40.0000 mg | ORAL_TABLET | Freq: Every day | ORAL | Status: DC

## 2017-09-10 MED ORDER — NITROGLYCERIN 0.4 MG SL SUBL: 0.4000 mg | SUBLINGUAL_TABLET | SUBLINGUAL | Status: DC | PRN

## 2017-09-10 MED ORDER — ENOXAPARIN SODIUM 40 MG/0.4ML ~~LOC~~ SOLN
40.0000 mg | SUBCUTANEOUS | Status: DC
  Administered 2017-09-10 – 2017-09-11 (×2): 40 mg via SUBCUTANEOUS
  Filled 2017-09-10: qty 0.4

## 2017-09-10 MED ORDER — SODIUM CHLORIDE 0.9 % IV SOLN
250.0000 mL | INTRAVENOUS | Status: DC | PRN
  Administered 2017-09-10: 250 mL via INTRAVENOUS

## 2017-09-10 MED ORDER — ACETAMINOPHEN 325 MG PO TABS: 650.0000 mg | ORAL_TABLET | ORAL | Status: DC | PRN

## 2017-09-10 NOTE — Progress Notes (Signed)
  Echocardiogram 2D Echocardiogram has been performed.  Theresa Crawford, Theresa Crawford R 09/10/2017, 11:44 AM

## 2017-09-10 NOTE — ED Notes (Signed)
Pt wants food when able to eat.

## 2017-09-10 NOTE — H&P (Signed)
History and Physical    Theresa Crawford:[id number] DOB: 1981-02-14 DOA: 09/10/2017  PCP: Jerrell Belfast, [name]   Patient coming from: Home  Chief Complaint: Chest pain, swelling, SOB   HPI: Theresa Crawford is a 36 y.o. female with medical history significant for chronic diastolic CHF, iron deficiency anemia, mitral stenosis, depression with anxiety, and pulmonary hypertension, now presenting to the emergency department for evaluation of chest pain, swelling, breath.  Patient was seen by her cardiologist on 09/08/2017 and reported increased swelling at that time while acknowledging some dietary indiscretions.  Lasix was changed to Bumex during that appointment.  Unortunately, condition is continued to worsen with increasing dyspnea, orthopnea, swelling, and now chest pain.  She called EMS and was treated with aspirin prior to arrival in the ED.  ED Course: Upon arrival to the ED, patient is found to be afebrile, saturating 89% on room air, tachypneic, slightly tachycardic, and with low-normal blood pressure.  EKG features a sinus tachycardia with rate 110, PACs, and QTc interval of 551 ms.  Chest x-ray is notable for vascular congestion and bilateral airspace opacities concerning for pulmonary edema.  Chemistry panel reveals a potassium of 2.7 and CBC is notable for a leukocytosis to 13,900 and stable chronic normocytic anemia with hemoglobin of 10.3.  BNP is elevated to 384 and troponin is undetectable.  Treated with supplemental oxygen, 80 mg IV Lasix, 20 mEq IV potassium, and 40 mEq oral potassium in the ED.  She will be admitted to the telemetry unit for ongoing evaluation and management of acute on chronic diastolic CHF.  Review of Systems:  All other systems reviewed and apart from HPI, are negative.  Past Medical History:  Diagnosis Date  . Chronic diastolic CHF (congestive heart failure) (HCC)   . History of open heart surgery   . Hypertension   . Mitral valve disease    mitral  regurgitation  . Normocytic anemia   . Obesity   . Premature atrial contractions   . Pulmonary hypertension (HCC) 06/12/2017  . PVC's (premature ventricular contractions)    a. h/o palpitations with event monitor in 03/2017 showing NSR with PACs/PVCs.    Past Surgical History:  Procedure Laterality Date  . ABDOMINAL HYSTERECTOMY    . CESAREAN SECTION     X's 2  . MITRAL VALVE REPAIR  03/2013    [facility] in [place], [place]   . PARTIAL HYSTERECTOMY  2011   PID w/problem with fallopian tubes  . TEE WITHOUT CARDIOVERSION N/A 05/26/2016   Procedure: TRANSESOPHAGEAL ECHOCARDIOGRAM (TEE);  Surgeon: [name], MD;  Location: [facility];  Service: Cardiovascular;  Laterality: N/A;     reports that she has been smoking cigarettes.  She started smoking about 17 years ago. She has been smoking about 0.25 packs per day. she has never used smokeless tobacco. She reports that she does not drink alcohol or use drugs.  Allergies  Allergen Reactions  . Lisinopril Shortness Of Breath and Swelling    Throat swelling  . Penicillins Shortness Of Breath and Swelling    Has patient had a PCN reaction causing immediate rash, facial/tongue/throat swelling, SOB or lightheadedness with hypotension: Yes Has patient had a PCN reaction causing severe rash involving mucus membranes or skin necrosis: Yes Has patient had a PCN reaction that required hospitalization No Has patient had a PCN reaction occurring within the last 10 years: No If all of the above answers are "NO", then may proceed with Cephalosporin use.   Marland Kitchen  Bee Venom   . Contrast Media [Iodinated Diagnostic Agents] Hives and Itching    Itching and hives to throat, neck, face and arms.   No difficulty breathing.   This was given [facility] approximately 2015  . Shellfish Allergy     Family History  Problem Relation Age of Onset  . Heart failure Father        just received LVAD  . Heart disease  Paternal Grandfather        stent placement     Prior to Admission medications   Medication Sig Start Date End Date Taking? Authorizing Provider  bumetanide (BUMEX) 2 MG tablet Take 1 tablet (2 mg total) by mouth 2 (two) times daily. 09/08/17   Jodelle GrossLawrence, Kathryn M, NP  citalopram (CELEXA) 40 MG tablet Take 40 mg by mouth at bedtime.     [provider]  docusate sodium (COLACE) 100 MG capsule Take 100 mg by mouth daily.    [provider]  ferrous sulfate 325 (65 FE) MG tablet Take 325 mg by mouth 2 (two) times daily with a meal.    [provider]  metoprolol succinate (TOPROL-XL) 25 MG 24 hr tablet Take 1 tablet (25 mg total) by mouth daily. 06/13/17   Standley BrookingGoodrich, Daniel P, MD  potassium chloride SA (K-DUR,KLOR-CON) 20 MEQ tablet Take 2 tablets (40 mEq total) by mouth daily. 08/14/17 09/13/17  Marcelino Dusteruke, Angela Nicole, PA  QUEtiapine (SEROQUEL) 25 MG tablet Take 25 mg by mouth at bedtime.     [provider]    Physical Exam: Vitals:   09/10/17 0101 09/10/17 0130 09/10/17 0212 09/10/17 0230  BP:  128/72 110/69 93/79  Pulse:  95 76 (!) 101  Resp:  (!) 38 20   Temp:      TempSrc:      SpO2:  (!) 89% 96% 95%  Weight: 104.8 kg (231 lb)     Height: 5\' 4"  (1.626 m)         Constitutional: NAD, calm  Eyes: PERTLA, lids and conjunctivae normal ENMT: Mucous membranes are moist. Posterior pharynx clear of any exudate or lesions.   Neck: normal, supple, no masses, no thyromegaly Respiratory: Rales bilaterally, mild dyspnea with speech, no wheezing. No accessory muscle use.  Cardiovascular: S1 & S2 heard, regular rate and rhythm. No significant JVD. Abdomen: No distension, no tenderness, no masses palpated. Bowel sounds normal.  Musculoskeletal: no clubbing / cyanosis. No joint deformity upper and lower extremities.    Skin: no significant rashes, lesions, ulcers. Warm, dry, well-perfused. Neurologic: CN 2-12 grossly intact. Sensation intact. Strength 5/5 in all  4 limbs.  Psychiatric:  Alert and oriented x 3. Calm, cooperative.     Labs on Admission: I have personally reviewed following labs and imaging studies  CBC: Recent Labs  Lab 09/10/17 0144  WBC 13.9*  NEUTROABS 11.7*  HGB 10.3*  HCT 33.0*  MCV 79.7  PLT 313   Basic Metabolic Panel: Recent Labs  Lab 09/10/17 0144  NA 141  K 2.7*  CL 105  CO2 26  GLUCOSE 92  BUN 11  CREATININE 0.86  CALCIUM 8.9   GFR: Estimated Creatinine Clearance: 105.6 mL/min (by C-G formula based on SCr of 0.86 mg/dL). Liver Function Tests: No results for input(s): AST, ALT, ALKPHOS, BILITOT, PROT, ALBUMIN in the last 168 hours. No results for input(s): LIPASE, AMYLASE in the last 168 hours. No results for input(s): AMMONIA in the last 168 hours. Coagulation Profile: No results for  input(s): INR, PROTIME in the last 168 hours. Cardiac Enzymes: Recent Labs  Lab 09/10/17 0144  TROPONINI <0.03   BNP (last 3 results) No results for input(s): PROBNP in the last 8760 hours. HbA1C: No results for input(s): HGBA1C in the last 72 hours. CBG: No results for input(s): GLUCAP in the last 168 hours. Lipid Profile: No results for input(s): CHOL, HDL, LDLCALC, TRIG, CHOLHDL, LDLDIRECT in the last 72 hours. Thyroid Function Tests: No results for input(s): TSH, T4TOTAL, FREET4, T3FREE, THYROIDAB in the last 72 hours. Anemia Panel: No results for input(s): VITAMINB12, FOLATE, FERRITIN, TIBC, IRON, RETICCTPCT in the last 72 hours. Urine analysis:    Component Value Date/Time   COLORURINE YELLOW 05/23/2016 2027   APPEARANCEUR CLEAR 05/23/2016 2027   LABSPEC <1.005 (L) 05/23/2016 2027   PHURINE 6.5 05/23/2016 2027   GLUCOSEU NEGATIVE 05/23/2016 2027   HGBUR NEGATIVE 05/23/2016 2027   BILIRUBINUR NEGATIVE 05/23/2016 2027   KETONESUR NEGATIVE 05/23/2016 2027   PROTEINUR NEGATIVE 05/23/2016 2027   NITRITE NEGATIVE 05/23/2016 2027   LEUKOCYTESUR TRACE (A) 05/23/2016 2027   Sepsis  Labs: @LABRCNTIP (procalcitonin:4,lacticidven:4) )No results found for this or any previous visit (from the past 240 hour(s)).   Radiological Exams on Admission: Dg Chest 2 View  Result Date: 09/10/2017 CLINICAL DATA:  Acute onset of shortness of breath and midsternal chest pain and pressure. Dizziness and weakness. EXAM: CHEST  2 VIEW COMPARISON:  Chest radiograph performed 07/06/2017 FINDINGS: The lungs are well-aerated. Vascular congestion is noted. Mild bilateral airspace opacities may reflect pulmonary edema. Small bilateral pleural effusions are suspected. No pneumothorax is seen. The heart is normal in size; the mediastinal contour is within normal limits. The patient is status post median sternotomy. A valve replacement is noted. No acute osseous abnormalities are seen. IMPRESSION: Vascular congestion. Mild bilateral airspace opacities raise concern for mild pulmonary edema. Small bilateral pleural effusions suspected. Electronically Signed   By: Roanna RaiderJeffery  Chang M.D.   On: 09/10/2017 01:58    EKG: Independently reviewed. Sinus tachycardia (rate 110), PAC's, QTc interval 551 ms.   Assessment/Plan  1. Acute on chronic diastolic CHF  - Presents with SOB, orthopnea, wt gain, and chest "heaviness" - Found to have crackles bilaterally, elevated BNP, edema on CXR  - She has mitral stenosis following mitral valve repair in 2014, severe on echo in September, and is following with cardiologist - Treated in ED with Lasix 80 mg IV  - SLIV, fluid-restrict diet, follow daily wt and I/O's, continue diuresis with Lasix 80 mg IV q12h, continue beta-blocker as tolerated, follow daily chem panel during diuresis, update echo    2. Hypokalemia  - Serum potassium is 2.7 on admission  - Treated in ED with 20 mEq IV and 40 mEq oral potassium - Given 2 g IV magnesium on admission  - Continue cardiac monitoring, repeat chemistries in am    3. Prolonged QT interval  - QTc 551 ms on admission, likely  secondary to hypokalemia  - Continue cardiac monitoring, replace potassium as above, avoid offending agents (hold her Celexa for now)    4. Depression with anxiety  - Appears stable  - Celexa held on admission in setting of prolonged QT - Continue Seroquel   5. Anemia  - Hgb is 10.3 on admission, similar to priors  - Continue iron-supplementation    DVT prophylaxis: Lovenox  Code Status: Full  Family Communication: Discussed with patient Disposition Plan: Admit to telemetry Consults called: None Admission status: Inpatient    Lavone Neriimothy S  Ashonti Leandro, MD [facility] Pager [phone]  If 7PM-7AM, please contact night-coverage [email or web address] Password [facility]  09/10/2017, 3:32 AM

## 2017-09-10 NOTE — ED Provider Notes (Signed)
[facility] EMERGENCY DEPARTMENT Provider Note   CSN: [id number] Arrival date & time: 09/10/17  0054     History   Chief Complaint Chief Complaint  Patient presents with  . Chest Pain    HPI Theresa Crawford is a 36 y.o. female.  The history is provided by the patient.  She has a history of chronic diastolic heart failure, hypertension, and anemia, pulmonary hypertension and comes in with onset at 11 PM of difficulty breathing and sharp anterior chest pain with radiation to the back and neck.  Chest pain is rated at 9/10.  Dyspnea is worse with exertion and with laying flat.  She has also had a cough productive of pink sputum.  She has had to be admitted to the hospital with flash pulmonary edema in the past, and dyspnea feels similar to that, but chest pain is somewhat worse than prior episodes.  She did receive aspirin in the ambulance coming to the hospital.  She is not had any other treatment.  She claims compliance with her medication.  Past Medical History:  Diagnosis Date  . Chronic diastolic CHF (congestive heart failure) (HCC)   . History of open heart surgery   . Hypertension   . Mitral valve disease    mitral regurgitation  . Normocytic anemia   . Obesity   . Premature atrial contractions   . Pulmonary hypertension (HCC) 06/12/2017  . PVC's (premature ventricular contractions)    a. h/o palpitations with event monitor in 03/2017 showing NSR with PACs/PVCs.    Patient Active Problem List   Diagnosis Date Noted  . Pulmonary edema 07/05/2017  . Hypokalemia 06/12/2017  . Pulmonary hypertension (HCC) 06/12/2017  . Flash pulmonary edema (HCC) 06/11/2017  . Depression with anxiety 06/11/2017  . Mitral valve stenosis, severe 06/11/2017  . Acute pulmonary edema (HCC) 10/10/2016  . S/P mitral valve repair   . CAP (community acquired pneumonia) 05/25/2016  . Leukocytosis 05/24/2016  . Acute respiratory failure with hypoxia (HCC) 05/23/2016  . Acute on chronic diastolic  CHF (congestive heart failure) (HCC) 05/23/2016  . Cough with hemoptysis 05/23/2016  . Postpartum complication pericarditis in 2009 with eventual needing MVR in 2015 05/23/2016  . Anemia, iron deficiency 05/23/2016  . Hypertension   . SOB (shortness of breath)   . Respiratory distress 02/16/2016  . Essential hypertension 02/16/2016    Past Surgical History:  Procedure Laterality Date  . ABDOMINAL HYSTERECTOMY    . CESAREAN SECTION     X's 2  . MITRAL VALVE REPAIR  03/2013    [facility] in [place]   . PARTIAL HYSTERECTOMY  2011   PID w/problem with fallopian tubes  . TEE WITHOUT CARDIOVERSION N/A 05/26/2016   Procedure: TRANSESOPHAGEAL ECHOCARDIOGRAM (TEE);  Surgeon: Laqueta LindenSuresh A Koneswaran, MD;  Location: [facility];  Service: Cardiovascular;  Laterality: N/A;    OB History    Gravida Para Term Preterm AB Living   2 2 2          SAB TAB Ectopic Multiple Live Births                   Home Medications    Prior to Admission medications   Medication Sig Start Date End Date Taking? Authorizing Provider  bumetanide (BUMEX) 2 MG tablet Take 1 tablet (2 mg total) by mouth 2 (two) times daily. 09/08/17   Jodelle GrossLawrence, Kathryn M, NP  citalopram (CELEXA) 40 MG tablet Take 40 mg by mouth at bedtime.  [provider]  docusate sodium (COLACE) 100 MG capsule Take 100 mg by mouth daily.    [provider]  ferrous sulfate 325 (65 FE) MG tablet Take 325 mg by mouth 2 (two) times daily with a meal.    [provider]  metoprolol succinate (TOPROL-XL) 25 MG 24 hr tablet Take 1 tablet (25 mg total) by mouth daily. 06/13/17   Standley BrookingGoodrich, Daniel P, MD  potassium chloride SA (K-DUR,KLOR-CON) 20 MEQ tablet Take 2 tablets (40 mEq total) by mouth daily. 08/14/17 09/13/17  Marcelino Dusteruke, Angela Nicole, PA  QUEtiapine (SEROQUEL) 25 MG tablet Take 25 mg by mouth at bedtime.     [provider]    Family History Family History  Problem Relation Age of Onset    . Heart failure Father        just received LVAD  . Heart disease Paternal Grandfather        stent placement    Social History Social History   Tobacco Use  . Smoking status: Current Some Day Smoker    Packs/day: 0.25    Types: Cigarettes    Start date: 11/03/1999  . Smokeless tobacco: Never Used  Substance Use Topics  . Alcohol use: No    Alcohol/week: 0.0 oz  . Drug use: No     Allergies   Lisinopril; Penicillins; Bee venom; Contrast media [iodinated diagnostic agents]; and Shellfish allergy   Review of Systems Review of Systems  All other systems reviewed and are negative.    Physical Exam Updated Vital Signs BP (!) 144/95 (BP Location: Left Arm)   Pulse (!) 109   Temp 99 F (37.2 C) (Oral)   Resp 16   Ht 5\' 4"  (1.626 m)   Wt 104.8 kg (231 lb)   LMP 08/28/2017   SpO2 97%   BMI 39.65 kg/m   Physical Exam  Nursing note and vitals reviewed.  36 year old female, appears dyspneic and is in mild respiratory distress. Vital signs are significant for tachycardia and hypertension. Oxygen saturation is 97%, which is normal. Head is normocephalic and atraumatic. PERRLA, EOMI. Oropharynx is clear. Neck is nontender and supple without adenopathy or JVD. Back is nontender and there is no CVA tenderness. Lungs are clear without rales, wheezes, or rhonchi. Chest is nontender. Heart has regular rate and rhythm without murmur. Abdomen is soft, flat, nontender without masses or hepatosplenomegaly and peristalsis is normoactive. Extremities have 1+ edema, full range of motion is present. Skin is warm and dry without rash. Neurologic: Mental status is normal, cranial nerves are intact, there are no motor or sensory deficits.  ED Treatments / Results  Labs (all labs ordered are listed, but only abnormal results are displayed) Labs Reviewed  BASIC METABOLIC PANEL - Abnormal; Notable for the following components:      Result Value   Potassium 2.7 (*)    All other  components within normal limits  BRAIN NATRIURETIC PEPTIDE - Abnormal; Notable for the following components:   B Natriuretic Peptide 384.0 (*)    All other components within normal limits  CBC WITH DIFFERENTIAL/PLATELET - Abnormal; Notable for the following components:   WBC 13.9 (*)    Hemoglobin 10.3 (*)    HCT 33.0 (*)    MCH 24.9 (*)    RDW 16.9 (*)    Neutro Abs 11.7 (*)    All other components within normal limits  TROPONIN I  MAGNESIUM  TROPONIN I  TROPONIN I    EKG  EKG Interpretation  Date/Time:  Saturday September 10 2017 01:00:49 EST Ventricular Rate:  110 PR Interval:    QRS Duration: 86 QT Interval:  407 QTC Calculation: 551 R Axis:   57 Text Interpretation:  Sinus tachycardia Premature atrial complexes Probable left atrial enlargement Nonspecific T wave abnormality Prolonged QT interval When compared with ECG of 07/05/2017, QT has lengthened Nonspecific T wave abnormality is now present Confirmed by Dione Booze ([id number]) on 09/10/2017 1:08:17 AM       Radiology Dg Chest 2 View  Result Date: 09/10/2017 CLINICAL DATA:  Acute onset of shortness of breath and midsternal chest pain and pressure. Dizziness and weakness. EXAM: CHEST  2 VIEW COMPARISON:  Chest radiograph performed 07/06/2017 FINDINGS: The lungs are well-aerated. Vascular congestion is noted. Mild bilateral airspace opacities may reflect pulmonary edema. Small bilateral pleural effusions are suspected. No pneumothorax is seen. The heart is normal in size; the mediastinal contour is within normal limits. The patient is status post median sternotomy. A valve replacement is noted. No acute osseous abnormalities are seen. IMPRESSION: Vascular congestion. Mild bilateral airspace opacities raise concern for mild pulmonary edema. Small bilateral pleural effusions suspected. Electronically Signed   By: Roanna Raider M.D.   On: 09/10/2017 01:58    Procedures Procedures (including critical care time)  Medications  Ordered in ED Medications  potassium chloride 10 mEq in 100 mL IVPB (not administered)  potassium chloride SA (K-DUR,KLOR-CON) CR tablet 40 mEq (not administered)  docusate sodium (COLACE) capsule 100 mg (not administered)  ferrous sulfate tablet 325 mg (not administered)  metoprolol tartrate (LOPRESSOR) tablet 12.5 mg (not administered)  potassium chloride SA (K-DUR,KLOR-CON) CR tablet 40 mEq (not administered)  QUEtiapine (SEROQUEL) tablet 25 mg (not administered)  sodium chloride flush (NS) 0.9 % injection 3 mL (not administered)  sodium chloride flush (NS) 0.9 % injection 3 mL (not administered)  0.9 %  sodium chloride infusion (not administered)  acetaminophen (TYLENOL) tablet 650 mg (not administered)  enoxaparin (LOVENOX) injection 40 mg (not administered)  furosemide (LASIX) injection 80 mg (not administered)  magnesium sulfate IVPB 2 g 50 mL (not administered)  ALPRAZolam (XANAX) tablet 0.25 mg (not administered)  furosemide (LASIX) injection 80 mg (80 mg Intravenous Given 09/10/17 0237)     Initial Impression / Assessment and Plan / ED Course  I have reviewed the triage vital signs and the nursing notes.  Pertinent labs & imaging results that were available during my care of the patient were reviewed by me and considered in my medical decision making (see chart for details).  Acute dyspnea in patient with known history of heart failure and prior episodes of flash pulmonary edema.  Old records are reviewed, and she does have several hospitalizations for pulmonary edema.  With chest pain, need to consider possibility of ACS.  Troponin has been ordered.  She will be given nitroglycerin and furosemide, and response assessed.  Troponin has come back normal.  BNP is moderately elevated and in a range which has been in with prior CHF exacerbations.  Chest x-ray does show early pulmonary edema.  Laboratory workup does show significant hypokalemia.  Oral and intravenous potassium has  been ordered.  It is felt that with her electrolyte disturbance, she will not be able to be sent home since she is going to lose additional potassium from diuresis.  Case is discussed with Dr. Antionette Char of [facility], who agrees to admit the patient.  Final Clinical Impressions(s) / ED Diagnoses   Final diagnoses:  Acute  on chronic diastolic heart failure (HCC)  Hypokalemia  Normochromic normocytic anemia    ED Discharge Orders    None       Dione Booze, MD 09/10/17 [id number]

## 2017-09-10 NOTE — ED Notes (Signed)
Lab called per protocol of critical K+ Result = 2.7. RN notified MD.

## 2017-09-10 NOTE — Progress Notes (Signed)
Patient admitted to the hospital earlier this morning by Dr. Antionette Charpyd  Patient seen and examined.  Shortness of breath is improving.  She reports good urine output.  She has some crackles at bases.  1+ pitting edema bilaterally.  She is been admitted to the hospital with acute on chronic diastolic CHF.  She has been started on intravenous Lasix.  Monitor intake and output.  Correct electrolytes.  Wean off oxygen as tolerated.  Theresa Crawford

## 2017-09-10 NOTE — ED Triage Notes (Signed)
Pt arrived via [facility] c/o chest pain with SOB. Hx of CHF.

## 2017-09-11 DIAGNOSIS — I050 Rheumatic mitral stenosis: Secondary | ICD-10-CM

## 2017-09-11 DIAGNOSIS — I5033 Acute on chronic diastolic (congestive) heart failure: Secondary | ICD-10-CM

## 2017-09-11 LAB — BASIC METABOLIC PANEL
Anion gap: 11 (ref 5–15)
BUN: 14 mg/dL (ref 6–20)
CHLORIDE: 104 mmol/L (ref 101–111)
CO2: 25 mmol/L (ref 22–32)
CREATININE: 0.9 mg/dL (ref 0.44–1.00)
Calcium: 8.8 mg/dL — ABNORMAL LOW (ref 8.9–10.3)
Glucose, Bld: 149 mg/dL — ABNORMAL HIGH (ref 65–99)
POTASSIUM: 3.4 mmol/L — AB (ref 3.5–5.1)
SODIUM: 140 mmol/L (ref 135–145)

## 2017-09-11 MED ORDER — POTASSIUM CHLORIDE CRYS ER 20 MEQ PO TBCR: 40.0000 meq | EXTENDED_RELEASE_TABLET | Freq: Every day | ORAL | 2 refills | Status: DC

## 2017-09-11 MED ORDER — METOPROLOL SUCCINATE ER 25 MG PO TB24: 25.0000 mg | ORAL_TABLET | Freq: Every day | ORAL | 2 refills | Status: DC

## 2017-09-11 NOTE — Progress Notes (Signed)
AVS printed and discussed with patient.  IV removed.  Site clean dry and intact.  New Rx given to patient.  Pt taken down by tech.   Pt stable upon discharge.

## 2017-09-11 NOTE — Discharge Summary (Signed)
Physician Discharge Summary  Theresa Crawford:[id number] DOB: April 04, 1981 DOA: 09/10/2017  PCP: Theresa Belfast, [name]  Admit date: 09/10/2017 Discharge date: 09/11/2017  Time spent: 45 minutes  Recommendations for Outpatient Follow-up:  -To be discharged home today. -Advised to follow-up with cardiology in 2 weeks for continued conversations regarding mitral valve surgery.  Discharge Diagnoses:  Principal Problem:   Acute on chronic diastolic CHF (congestive heart failure) (HCC) Active Problems:   Hypertension   Anemia, iron deficiency   Depression with anxiety   Mitral valve stenosis, severe   Hypokalemia   Pulmonary hypertension (HCC)   Prolonged QT interval   Discharge Condition: Stable and improved  Filed Weights   09/10/17 0101 09/10/17 0914 09/11/17 0500  Weight: 104.8 kg (231 lb) 102.6 kg (226 lb 3.1 oz) 101.2 kg (223 lb)    History of present illness:  As per Dr. Antionette Char on 12/29: Theresa Crawford is a 36 y.o. female with medical history significant for chronic diastolic CHF, iron deficiency anemia, mitral stenosis, depression with anxiety, and pulmonary hypertension, now presenting to the emergency department for evaluation of chest pain, swelling, breath.  Patient was seen by her cardiologist on 09/08/2017 and reported increased swelling at that time while acknowledging some dietary indiscretions.  Lasix was changed to Bumex during that appointment.  Unortunately, condition is continued to worsen with increasing dyspnea, orthopnea, swelling, and now chest pain.  She called EMS and was treated with aspirin prior to arrival in the ED.  ED Course: Upon arrival to the ED, patient is found to be afebrile, saturating 89% on room air, tachypneic, slightly tachycardic, and with low-normal blood pressure.  EKG features a sinus tachycardia with rate 110, PACs, and QTc interval of 551 ms.  Chest x-ray is notable for vascular congestion and bilateral airspace opacities concerning  for pulmonary edema.  Chemistry panel reveals a potassium of 2.7 and CBC is notable for a leukocytosis to 13,900 and stable chronic normocytic anemia with hemoglobin of 10.3.  BNP is elevated to 384 and troponin is undetectable.  Treated with supplemental oxygen, 80 mg IV Lasix, 20 mEq IV potassium, and 40 mEq oral potassium in the ED.  She will be admitted to the telemetry unit for ongoing evaluation and management of acute on chronic diastolic CHF.    Hospital Course:   Acute on chronic diastolic heart failure -Mainly on account of severe mitral valve stenosis. -She admits to some dietary indiscretions surrounding the holidays. -She was placed on IV Lasix and has diuresed 1.3 L since admission. -She feels like she is back at baseline, has been ambulating without shortness of breath or other difficulties, she currently has no oxygen requirements. -We will transition back to home dose of Bumex and close follow-up with cardiology as recommended.  -Will need to have continued conversations with her cardiologist surrounding mitral valve surgery. -Continue beta-blocker.  Hypokalemia -Replaced, mag is okay at 2.6.  Depression with anxiety -Mood is stable, continue Celexa, Seroquel.   Procedures:  None   Consultations:  None  Discharge Instructions  Discharge Instructions    Diet - low sodium heart healthy   Complete by:  As directed    Increase activity slowly   Complete by:  As directed      Allergies as of 09/11/2017      Reactions   Lisinopril Shortness Of Breath, Swelling   Throat swelling   Penicillins Shortness Of Breath, Swelling   Has patient had a PCN reaction causing immediate  rash, facial/tongue/throat swelling, SOB or lightheadedness with hypotension: Yes Has patient had a PCN reaction causing severe rash involving mucus membranes or skin necrosis: Yes Has patient had a PCN reaction that required hospitalization No Has patient had a PCN reaction occurring within  the last 10 years: No If all of the above answers are "NO", then may proceed with Cephalosporin use.   Perflutren Lipid Microsphere Shortness Of Breath   Other reaction(s): Chest Pain/Tightness   Bee Venom    Contrast Media [iodinated Diagnostic Agents] Hives, Itching   Itching and hives to throat, neck, face and arms.   No difficulty breathing.   This [facility]. Contrast Dye   Shellfish Allergy       Medication List    TAKE these medications   bumetanide 2 MG tablet Commonly known as:  BUMEX Take 1 tablet (2 mg total) by mouth 2 (two) times daily.   citalopram 40 MG tablet Commonly known as:  CELEXA Take 40 mg by mouth at bedtime.   docusate sodium 100 MG capsule Commonly known as:  COLACE Take 100 mg by mouth daily.   EPIPEN 2-PAK 0.3 mg/0.3 mL Soaj injection Generic drug:  EPINEPHrine Inject 0.3 Units into the muscle once as needed.   ferrous sulfate 325 (65 FE) MG tablet Take 325 mg by mouth 2 (two) times daily with a meal.   metoprolol succinate 25 MG 24 hr tablet Commonly known as:  TOPROL-XL Take 1 tablet (25 mg total) by mouth daily.   potassium chloride SA 20 MEQ tablet Commonly known as:  K-DUR,KLOR-CON Take 2 tablets (40 mEq total) by mouth daily.   PROBIOTIC DAILY PO Take 1 capsule by mouth daily.   QUEtiapine 25 MG tablet Commonly known as:  SEROQUEL Take 25 mg by mouth at bedtime.      Allergies  Allergen Reactions  . Lisinopril Shortness Of Breath and Swelling    Throat swelling  . Penicillins Shortness Of Breath and Swelling    Has patient had a PCN reaction causing immediate rash, facial/tongue/throat swelling, SOB or lightheadedness with hypotension: Yes Has patient had a PCN reaction causing severe rash involving mucus membranes or skin necrosis: Yes Has patient had a PCN reaction that required hospitalization No Has patient had a PCN reaction occurring within the last 10 years: No If all of  the above answers are "NO", then may proceed with Cephalosporin use.   Marland Kitchen. Perflutren Lipid Microsphere Shortness Of Breath    Other reaction(s): Chest Pain/Tightness  . Bee Venom   . Contrast Media [Iodinated Diagnostic Agents] Hives and Itching    Itching and hives to throat, neck, face and arms.   No difficulty breathing.   This was [facility]. Contrast Dye  . Shellfish Allergy    Follow-up Information    Laqueta LindenKoneswaran, Suresh A, MD. Schedule an appointment as soon as possible for a visit in 2 week(s).   Specialty:  Cardiology Contact information: [address] [place] [address] [phone]            The results of significant diagnostics from this hospitalization (including imaging, microbiology, ancillary and laboratory) are listed below for reference.    Significant Diagnostic Studies: Dg Chest 2 View  Result Date: 09/10/2017 CLINICAL DATA:  Acute onset of shortness of breath and midsternal chest pain and pressure. Dizziness and weakness. EXAM: CHEST  2 VIEW COMPARISON:  Chest radiograph performed 07/06/2017 FINDINGS: The lungs are  well-aerated. Vascular congestion is noted. Mild bilateral airspace opacities may reflect pulmonary edema. Small bilateral pleural effusions are suspected. No pneumothorax is seen. The heart is normal in size; the mediastinal contour is within normal limits. The patient is status post median sternotomy. A valve replacement is noted. No acute osseous abnormalities are seen. IMPRESSION: Vascular congestion. Mild bilateral airspace opacities raise concern for mild pulmonary edema. Small bilateral pleural effusions suspected. Electronically Signed   By: Roanna Raider M.D.   On: 09/10/2017 01:58    Microbiology: No results found for this or any previous visit (from the past 240 hour(s)).   Labs: Basic Metabolic Panel: Recent Labs  Lab 09/10/17 0144 09/10/17 0540 09/10/17 1730 09/11/17 0626  NA 141  --    --  140  K 2.7*  --  3.8 3.4*  CL 105  --   --  104  CO2 26  --   --  25  GLUCOSE 92  --   --  149*  BUN 11  --   --  14  CREATININE 0.86  --   --  0.90  CALCIUM 8.9  --   --  8.8*  MG  --  2.6*  --   --    Liver Function Tests: No results for input(s): AST, ALT, ALKPHOS, BILITOT, PROT, ALBUMIN in the last 168 hours. No results for input(s): LIPASE, AMYLASE in the last 168 hours. No results for input(s): AMMONIA in the last 168 hours. CBC: Recent Labs  Lab 09/10/17 0144  WBC 13.9*  NEUTROABS 11.7*  HGB 10.3*  HCT 33.0*  MCV 79.7  PLT 313   Cardiac Enzymes: Recent Labs  Lab 09/10/17 0144 09/10/17 0540 09/10/17 1049  TROPONINI <0.03 <0.03 <0.03   BNP: BNP (last 3 results) Recent Labs    06/11/17 0100 07/05/17 0226 09/10/17 0145  BNP 323.0* 228.0* 384.0*    ProBNP (last 3 results) No results for input(s): PROBNP in the last 8760 hours.  CBG: No results for input(s): GLUCAP in the last 168 hours.     Signed:  Chaya Jan  [facility] Pager: [phone] 09/11/2017, 11:38 AM

## 2017-10-12 ENCOUNTER — Ambulatory Visit: Payer: Medicaid Other | Admitting: Cardiovascular Disease

## 2017-10-13 ENCOUNTER — Encounter: Payer: Self-pay | Admitting: Cardiovascular Disease

## 2017-11-23 ENCOUNTER — Encounter (HOSPITAL_COMMUNITY): Payer: Self-pay | Admitting: *Deleted

## 2017-11-23 ENCOUNTER — Other Ambulatory Visit: Payer: Self-pay

## 2017-11-23 ENCOUNTER — Emergency Department (HOSPITAL_COMMUNITY)
Admission: EM | Admit: 2017-11-23 | Discharge: 2017-11-23 | Disposition: A | Discharge status: Home / Self Care | Payer: Medicaid Other | Attending: Emergency Medicine | Admitting: Emergency Medicine

## 2017-11-23 DIAGNOSIS — Z79899 Other long term (current) drug therapy: Secondary | ICD-10-CM | POA: Insufficient documentation

## 2017-11-23 DIAGNOSIS — J069 Acute upper respiratory infection, unspecified: Secondary | ICD-10-CM | POA: Diagnosis not present

## 2017-11-23 DIAGNOSIS — I272 Pulmonary hypertension, unspecified: Secondary | ICD-10-CM | POA: Diagnosis not present

## 2017-11-23 DIAGNOSIS — I10 Essential (primary) hypertension: Secondary | ICD-10-CM | POA: Insufficient documentation

## 2017-11-23 DIAGNOSIS — F17210 Nicotine dependence, cigarettes, uncomplicated: Secondary | ICD-10-CM | POA: Insufficient documentation

## 2017-11-23 DIAGNOSIS — I5032 Chronic diastolic (congestive) heart failure: Secondary | ICD-10-CM | POA: Insufficient documentation

## 2017-11-23 DIAGNOSIS — M436 Torticollis: Secondary | ICD-10-CM | POA: Diagnosis not present

## 2017-11-23 DIAGNOSIS — M542 Cervicalgia: Secondary | ICD-10-CM | POA: Diagnosis present

## 2017-11-23 MED ORDER — HYDROXYZINE HCL 25 MG PO TABS
50.0000 mg | ORAL_TABLET | Freq: Once | ORAL | Status: AC
  Administered 2017-11-23: 50 mg via ORAL
  Filled 2017-11-23: qty 2

## 2017-11-23 MED ORDER — TRAMADOL HCL 50 MG PO TABS: 50.0000 mg | ORAL_TABLET | Freq: Four times a day (QID) | ORAL | 0 refills | Status: DC | PRN

## 2017-11-23 MED ORDER — PREDNISONE 20 MG PO TABS: 40.0000 mg | ORAL_TABLET | Freq: Every day | ORAL | 0 refills | Status: DC

## 2017-11-23 MED ORDER — IBUPROFEN 800 MG PO TABS
800.0000 mg | ORAL_TABLET | Freq: Once | ORAL | Status: AC
  Administered 2017-11-23: 800 mg via ORAL
  Filled 2017-11-23: qty 1

## 2017-11-23 MED ORDER — DIAZEPAM 5 MG PO TABS: 5.0000 mg | ORAL_TABLET | Freq: Two times a day (BID) | ORAL | 0 refills | Status: DC | PRN

## 2017-11-23 MED ORDER — ALBUTEROL SULFATE HFA 108 (90 BASE) MCG/ACT IN AERS
2.0000 | INHALATION_SPRAY | RESPIRATORY_TRACT | Status: DC | PRN
  Filled 2017-11-23: qty 6.7

## 2017-11-23 MED ORDER — HYDROMORPHONE HCL 1 MG/ML IJ SOLN
1.0000 mg | Freq: Once | INTRAMUSCULAR | Status: AC
  Administered 2017-11-23: 1 mg via INTRAMUSCULAR
  Filled 2017-11-23: qty 1

## 2017-11-23 MED ORDER — OXYCODONE-ACETAMINOPHEN 5-325 MG PO TABS: 1.0000 | ORAL_TABLET | ORAL | 0 refills | Status: DC | PRN

## 2017-11-23 NOTE — ED Triage Notes (Signed)
Pt c/o feeling anxious earlier this evening with some chest pain and sob; pt states she started coughing and started having pain to her left neck and shoulder; pt states she now has limited movement to that side of her body

## 2017-11-23 NOTE — ED Provider Notes (Signed)
[facility] EMERGENCY DEPARTMENT Provider Note   CSN: [id number] Arrival date & time: 11/23/17  0112     History   Chief Complaint Chief Complaint  Patient presents with  . Neck Pain    HPI Theresa Crawford is a 37 y.o. female.  Vision presents to the ER for evaluation of left-sided neck pain.  Patient reports that she was resting, lying on her side and suddenly started coughing.  She had a violent coughing spell and then noticed that she was having pain on the left side of her neck.  Pain goes from her neck into the shoulder area.  She reports that there is a large "knot" on the shoulder and she cannot turn her head to the side because of pain in the neck.      Past Medical History:  Diagnosis Date  . Chronic diastolic CHF (congestive heart failure) (HCC)   . History of open heart surgery   . Hypertension   . Mitral valve disease    mitral regurgitation  . Normocytic anemia   . Obesity   . Premature atrial contractions   . Pulmonary hypertension (HCC) 06/12/2017  . PVC's (premature ventricular contractions)    a. h/o palpitations with event monitor in 03/2017 showing NSR with PACs/PVCs.    Patient Active Problem List   Diagnosis Date Noted  . Prolonged QT interval 09/10/2017  . Normochromic normocytic anemia   . Pulmonary edema 07/05/2017  . Hypokalemia 06/12/2017  . Pulmonary hypertension (HCC) 06/12/2017  . Flash pulmonary edema (HCC) 06/11/2017  . Depression with anxiety 06/11/2017  . Mitral valve stenosis, severe 06/11/2017  . Acute pulmonary edema (HCC) 10/10/2016  . S/P mitral valve repair   . CAP (community acquired pneumonia) 05/25/2016  . Leukocytosis 05/24/2016  . Acute respiratory failure with hypoxia (HCC) 05/23/2016  . Acute on chronic diastolic CHF (congestive heart failure) (HCC) 05/23/2016  . Cough with hemoptysis 05/23/2016  . Postpartum complication pericarditis in 2009 with eventual needing MVR in 2015 05/23/2016  . Anemia, iron deficiency  05/23/2016  . Hypertension   . SOB (shortness of breath)   . Respiratory distress 02/16/2016  . Essential hypertension 02/16/2016    Past Surgical History:  Procedure Laterality Date  . ABDOMINAL HYSTERECTOMY    . CESAREAN SECTION     X's 2  . MITRAL VALVE REPAIR  03/2013    [facility] in [place], [place]   . PARTIAL HYSTERECTOMY  2011   PID w/problem with fallopian tubes  . TEE WITHOUT CARDIOVERSION N/A 05/26/2016   Procedure: TRANSESOPHAGEAL ECHOCARDIOGRAM (TEE);  Surgeon: [name], MD;  Location: [facility] ENDO SU[facility];  Service: Cardiovascular;  Laterality: N/A;    OB History    Gravida Para Term Preterm AB Living   2 2 2          SAB TAB Ectopic Multiple Live Births                   Home Medications    Prior to Admission medications   Medication Sig Start Date End Date Taking? Authorizing Provider  bumetanide (BUMEX) 2 MG tablet Take 1 tablet (2 mg total) by mouth 2 (two) times daily. 09/08/17   Jodelle Gross, NP  citalopram (CELEXA) 40 MG tablet Take 40 mg by mouth at bedtime.     [provider]  diazepam (VALIUM) 5 MG tablet Take 1 tablet (5 mg total) by mouth every 12 (twelve) hours as needed for muscle spasms (spasms). 11/23/17  Gilda Crease, MD  docusate sodium (COLACE) 100 MG capsule Take 100 mg by mouth daily.    [provider]  EPINEPHrine (EPIPEN 2-PAK) 0.3 mg/0.3 mL IJ SOAJ injection Inject 0.3 Units into the muscle once as needed.    [provider]  ferrous sulfate 325 (65 FE) MG tablet Take 325 mg by mouth 2 (two) times daily with a meal.    [provider]  metoprolol succinate (TOPROL-XL) 25 MG 24 hr tablet Take 1 tablet (25 mg total) by mouth daily. 09/11/17   Philip Aspen, Limmie Patricia, MD  oxyCODONE-acetaminophen (PERCOCET) 5-325 MG tablet Take 1 tablet by mouth every 4 (four) hours as needed. 11/23/17   Jacques Fife, Canary Brim, MD  potassium chloride SA (K-DUR,KLOR-CON) 20 MEQ tablet  Take 2 tablets (40 mEq total) by mouth daily. 09/11/17 10/11/17  Philip Aspen, Limmie Patricia, MD  predniSONE (DELTASONE) 20 MG tablet Take 2 tablets (40 mg total) by mouth daily with breakfast. 11/23/17   Suzanna Zahn, Canary Brim, MD  Probiotic Product (PROBIOTIC DAILY PO) Take 1 capsule by mouth daily.    [provider]  QUEtiapine (SEROQUEL) 25 MG tablet Take 25 mg by mouth at bedtime.     [provider]    Family History Family History  Problem Relation Age of Onset  . Heart failure Father        just received LVAD  . Heart disease Paternal Grandfather        stent placement    Social History Social History   Tobacco Use  . Smoking status: Current Some Day Smoker    Packs/day: 0.25    Types: Cigarettes    Start date: 11/03/1999  . Smokeless tobacco: Never Used  Substance Use Topics  . Alcohol use: No    Alcohol/week: 0.0 oz  . Drug use: No     Allergies   Lisinopril; Penicillins; Perflutren lipid microsphere; Bee venom; Contrast media [iodinated diagnostic agents]; and Shellfish allergy   Review of Systems Review of Systems  Respiratory: Positive for cough.   Musculoskeletal: Positive for neck pain.  All other systems reviewed and are negative.    Physical Exam Updated Vital Signs BP (!) 144/122 (BP Location: Right Arm)   Pulse 72   Temp 97.7 F (36.5 C) (Oral)   Resp 20   Ht 5\' 4"  (1.626 m)   Wt 104.8 kg (231 lb)   SpO2 100%   BMI 39.65 kg/m   Physical Exam  Constitutional: She is oriented to person, place, and time. She appears well-developed and well-nourished. No distress.  HENT:  Head: Normocephalic and atraumatic.  Right Ear: Hearing normal.  Left Ear: Hearing normal.  Nose: Nose normal.  Mouth/Throat: Oropharynx is clear and moist and mucous membranes are normal.  Eyes: Conjunctivae and EOM are normal. Pupils are equal, round, and reactive to light.  Neck: Neck supple. Muscular tenderness (Tenderness and spasm left paraspinal  muscles) present. Decreased range of motion present.  Cardiovascular: Regular rhythm, S1 normal and S2 normal. Exam reveals no gallop and no friction rub.  No murmur heard. Pulmonary/Chest: Effort normal. No respiratory distress. She has wheezes. She exhibits no tenderness.  Abdominal: Soft. Normal appearance and bowel sounds are normal. There is no hepatosplenomegaly. There is no tenderness. There is no rebound, no guarding, no tenderness at McBurney's point and negative Murphy's sign. No hernia.  Neurological: She is alert and oriented to person, place, and time. She has normal strength. No cranial nerve deficit or sensory deficit.  Coordination normal. GCS eye subscore is 4. GCS verbal subscore is 5. GCS motor subscore is 6.  Skin: Skin is warm, dry and intact. No rash noted. No cyanosis.  Psychiatric: She has a normal mood and affect. Her speech is normal and behavior is normal. Thought content normal.  Nursing note and vitals reviewed.    ED Treatments / Results  Labs (all labs ordered are listed, but only abnormal results are displayed) Labs Reviewed - No data to display  EKG  EKG Interpretation None       Radiology No results found.  Procedures Procedures (including critical care time)  Medications Ordered in ED Medications  HYDROmorphone (DILAUDID) injection 1 mg (not administered)  ibuprofen (ADVIL,MOTRIN) tablet 800 mg (not administered)  hydrOXYzine (ATARAX/VISTARIL) tablet 50 mg (not administered)  albuterol (PROVENTIL HFA;VENTOLIN HFA) 108 (90 Base) MCG/ACT inhaler 2 puff (not administered)     Initial Impression / Assessment and Plan / ED Course  I have reviewed the triage vital signs and the nursing notes.  Pertinent labs & imaging results that were available during my care of the patient were reviewed by me and considered in my medical decision making (see chart for details).     Patient presents with complaints of left-sided neck pain.  This began after  having a violent coughing spell.  Lung examination does reveal very slight wheezing but good air movement, normal oxygenation.  No clinical concern for pneumonia.  She will be treated with albuterol.  I did discuss with her the possibility of steroids, but she reports that it caused her to retain too much fluid.  I will give her a prescription, but she will not fill it unless her breathing worsens.  She does have severe tenderness and spasm of the paraspinal muscles on the left side of her neck consistent with torticollis.  No radiation to the arm, normal strength in upper extremities, normal sensation.  No concern for herniated disc or acute nerve impingement.  Will treat with analgesia and muscle relaxers.  Final Clinical Impressions(s) / ED Diagnoses   Final diagnoses:  Torticollis, acute  Upper respiratory tract infection, unspecified type    ED Discharge Orders        Ordered    oxyCODONE-acetaminophen (PERCOCET) 5-325 MG tablet  Every 4 hours PRN     11/23/17 0314    diazepam (VALIUM) 5 MG tablet  Every 12 hours PRN     11/23/17 0314    predniSONE (DELTASONE) 20 MG tablet  Daily with breakfast     11/23/17 0314       Gilda CreasePollina, Tresean Mattix J, MD 11/23/17 [phone]

## 2018-01-06 ENCOUNTER — Emergency Department (HOSPITAL_COMMUNITY)
Admission: EM | Admit: 2018-01-06 | Discharge: 2018-01-06 | Disposition: A | Discharge status: Home / Self Care | Payer: Medicaid Other | Attending: Emergency Medicine | Admitting: Emergency Medicine

## 2018-01-06 ENCOUNTER — Emergency Department (HOSPITAL_COMMUNITY): Payer: Medicaid Other

## 2018-01-06 ENCOUNTER — Encounter (HOSPITAL_COMMUNITY): Payer: Self-pay | Admitting: Emergency Medicine

## 2018-01-06 DIAGNOSIS — Z79899 Other long term (current) drug therapy: Secondary | ICD-10-CM | POA: Insufficient documentation

## 2018-01-06 DIAGNOSIS — R0989 Other specified symptoms and signs involving the circulatory and respiratory systems: Secondary | ICD-10-CM | POA: Insufficient documentation

## 2018-01-06 DIAGNOSIS — J40 Bronchitis, not specified as acute or chronic: Secondary | ICD-10-CM | POA: Insufficient documentation

## 2018-01-06 DIAGNOSIS — F17210 Nicotine dependence, cigarettes, uncomplicated: Secondary | ICD-10-CM | POA: Insufficient documentation

## 2018-01-06 DIAGNOSIS — I110 Hypertensive heart disease with heart failure: Secondary | ICD-10-CM | POA: Insufficient documentation

## 2018-01-06 DIAGNOSIS — R0602 Shortness of breath: Secondary | ICD-10-CM | POA: Diagnosis present

## 2018-01-06 DIAGNOSIS — I5032 Chronic diastolic (congestive) heart failure: Secondary | ICD-10-CM | POA: Diagnosis not present

## 2018-01-06 LAB — CBC WITH DIFFERENTIAL/PLATELET
BASOS ABS: 0 10*3/uL (ref 0.0–0.1)
Basophils Relative: 0 %
Eosinophils Absolute: 0.1 10*3/uL (ref 0.0–0.7)
Eosinophils Relative: 1 %
HEMATOCRIT: 29.4 % — AB (ref 36.0–46.0)
Hemoglobin: 9.1 g/dL — ABNORMAL LOW (ref 12.0–15.0)
LYMPHS PCT: 8 %
Lymphs Abs: 1 10*3/uL (ref 0.7–4.0)
MCH: 24.6 pg — ABNORMAL LOW (ref 26.0–34.0)
MCHC: 31 g/dL (ref 30.0–36.0)
MCV: 79.5 fL (ref 78.0–100.0)
MONO ABS: 0.8 10*3/uL (ref 0.1–1.0)
Monocytes Relative: 6 %
NEUTROS ABS: 11.2 10*3/uL — AB (ref 1.7–7.7)
Neutrophils Relative %: 85 %
Platelets: 361 10*3/uL (ref 150–400)
RBC: 3.7 MIL/uL — ABNORMAL LOW (ref 3.87–5.11)
RDW: 16.6 % — AB (ref 11.5–15.5)
WBC: 13.1 10*3/uL — ABNORMAL HIGH (ref 4.0–10.5)

## 2018-01-06 LAB — BASIC METABOLIC PANEL
Anion gap: 9 (ref 5–15)
BUN: 11 mg/dL (ref 6–20)
CHLORIDE: 108 mmol/L (ref 101–111)
CO2: 22 mmol/L (ref 22–32)
Calcium: 8.6 mg/dL — ABNORMAL LOW (ref 8.9–10.3)
Creatinine, Ser: 0.82 mg/dL (ref 0.44–1.00)
GFR calc Af Amer: >60 mL/min (ref >60)
GFR calc non Af Amer: >60 mL/min (ref >60)
GLUCOSE: 116 mg/dL — AB (ref 65–99)
POTASSIUM: 3.3 mmol/L — AB (ref 3.5–5.1)
SODIUM: 139 mmol/L (ref 135–145)

## 2018-01-06 LAB — BRAIN NATRIURETIC PEPTIDE: B Natriuretic Peptide: 314 pg/mL — ABNORMAL HIGH (ref 0.0–100.0)

## 2018-01-06 LAB — TROPONIN I

## 2018-01-06 MED ORDER — ALBUTEROL SULFATE HFA 108 (90 BASE) MCG/ACT IN AERS
2.0000 | INHALATION_SPRAY | Freq: Once | RESPIRATORY_TRACT | Status: AC
Start: 2018-01-06 — End: 2018-01-06
  Administered 2018-01-06: 2 via RESPIRATORY_TRACT
  Filled 2018-01-06: qty 6.7

## 2018-01-06 MED ORDER — PREDNISONE 50 MG PO TABS
60.0000 mg | ORAL_TABLET | Freq: Once | ORAL | Status: AC
  Administered 2018-01-06: 60 mg via ORAL
  Filled 2018-01-06: qty 1

## 2018-01-06 MED ORDER — FUROSEMIDE 10 MG/ML IJ SOLN
40.0000 mg | Freq: Once | INTRAMUSCULAR | Status: AC
  Administered 2018-01-06: 40 mg via INTRAVENOUS
  Filled 2018-01-06: qty 4

## 2018-01-06 MED ORDER — ACETAMINOPHEN 325 MG PO TABS
650.0000 mg | ORAL_TABLET | Freq: Once | ORAL | Status: AC
  Administered 2018-01-06: 650 mg via ORAL
  Filled 2018-01-06: qty 2

## 2018-01-06 MED ORDER — ALBUTEROL SULFATE (2.5 MG/3ML) 0.083% IN NEBU
5.0000 mg | INHALATION_SOLUTION | Freq: Once | RESPIRATORY_TRACT | Status: AC
  Administered 2018-01-06: 5 mg via RESPIRATORY_TRACT
  Filled 2018-01-06: qty 6

## 2018-01-06 MED ORDER — AEROCHAMBER PLUS FLO-VU MEDIUM MISC
1.0000 | Freq: Once | Status: AC
  Administered 2018-01-06: 1
  Filled 2018-01-06: qty 1

## 2018-01-06 MED ORDER — PREDNISONE 20 MG PO TABS: 20.0000 mg | ORAL_TABLET | Freq: Two times a day (BID) | ORAL | 0 refills | Status: DC

## 2018-01-06 NOTE — Discharge Instructions (Addendum)
We are prescribing an inhaler and prednisone to treat the bronchitis which is likely caused from smoking.  It is important to stop smoking.  Your chest x-ray indicates that you are somewhat fluid overloaded.  Increase your Lasix, to 40 mg twice a day for 3 days along with increasing the potassium to twice a day for 3 days.  Make sure that your doctor rechecks your blood tests next week to evaluate the potassium and kidney function.  Return here, if needed, for problems.

## 2018-01-06 NOTE — ED Notes (Signed)
Pt taken to xray 

## 2018-01-06 NOTE — ED Notes (Signed)
Pt placed on 02 2l [place] for comfort.

## 2018-01-06 NOTE — ED Triage Notes (Signed)
Patient complains of shortness of breath and feeling like she can't catch her breath that started 30 minutes ago. States nausea but denies vomiting, diarrhea. Patient states history of CHF and valve repair.
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
## 2018-01-06 NOTE — ED Notes (Signed)
Pt states has urinated large amount. Nuns cap in toilet and advised pt to let us know when she urinates to keep up with o/p

## 2018-01-11 ENCOUNTER — Encounter (HOSPITAL_COMMUNITY): Payer: Self-pay | Admitting: *Deleted

## 2018-01-11 ENCOUNTER — Other Ambulatory Visit: Payer: Self-pay

## 2018-01-11 ENCOUNTER — Emergency Department (HOSPITAL_COMMUNITY): Payer: Medicaid Other

## 2018-01-11 ENCOUNTER — Observation Stay (HOSPITAL_COMMUNITY)
Admission: EM | Admit: 2018-01-11 | Discharge: 2018-01-12 | Disposition: A | Discharge status: Home / Self Care | Payer: Medicaid Other | Attending: Internal Medicine | Admitting: Internal Medicine

## 2018-01-11 DIAGNOSIS — I10 Essential (primary) hypertension: Secondary | ICD-10-CM | POA: Diagnosis not present

## 2018-01-11 DIAGNOSIS — J9601 Acute respiratory failure with hypoxia: Secondary | ICD-10-CM | POA: Diagnosis present

## 2018-01-11 DIAGNOSIS — R0603 Acute respiratory distress: Secondary | ICD-10-CM | POA: Diagnosis not present

## 2018-01-11 DIAGNOSIS — Z79899 Other long term (current) drug therapy: Secondary | ICD-10-CM | POA: Insufficient documentation

## 2018-01-11 DIAGNOSIS — Z9889 Other specified postprocedural states: Secondary | ICD-10-CM

## 2018-01-11 DIAGNOSIS — J810 Acute pulmonary edema: Principal | ICD-10-CM | POA: Insufficient documentation

## 2018-01-11 DIAGNOSIS — D508 Other iron deficiency anemias: Secondary | ICD-10-CM | POA: Diagnosis not present

## 2018-01-11 DIAGNOSIS — I272 Pulmonary hypertension, unspecified: Secondary | ICD-10-CM | POA: Diagnosis not present

## 2018-01-11 DIAGNOSIS — I050 Rheumatic mitral stenosis: Secondary | ICD-10-CM | POA: Diagnosis not present

## 2018-01-11 DIAGNOSIS — I5033 Acute on chronic diastolic (congestive) heart failure: Secondary | ICD-10-CM | POA: Diagnosis not present

## 2018-01-11 DIAGNOSIS — I110 Hypertensive heart disease with heart failure: Secondary | ICD-10-CM | POA: Diagnosis not present

## 2018-01-11 DIAGNOSIS — D509 Iron deficiency anemia, unspecified: Secondary | ICD-10-CM | POA: Diagnosis not present

## 2018-01-11 DIAGNOSIS — F17210 Nicotine dependence, cigarettes, uncomplicated: Secondary | ICD-10-CM | POA: Diagnosis not present

## 2018-01-11 DIAGNOSIS — R0781 Pleurodynia: Secondary | ICD-10-CM

## 2018-01-11 DIAGNOSIS — R0600 Dyspnea, unspecified: Secondary | ICD-10-CM | POA: Diagnosis present

## 2018-01-11 DIAGNOSIS — R0602 Shortness of breath: Secondary | ICD-10-CM

## 2018-01-11 LAB — CBC WITH DIFFERENTIAL/PLATELET
Basophils Absolute: 0 10*3/uL (ref 0.0–0.1)
Basophils Relative: 0 %
Eosinophils Absolute: 0 10*3/uL (ref 0.0–0.7)
Eosinophils Relative: 0 %
HCT: 29.8 % — ABNORMAL LOW (ref 36.0–46.0)
HEMOGLOBIN: 9.2 g/dL — AB (ref 12.0–15.0)
LYMPHS ABS: 1.1 10*3/uL (ref 0.7–4.0)
LYMPHS PCT: 8 %
MCH: 24.2 pg — AB (ref 26.0–34.0)
MCHC: 30.9 g/dL (ref 30.0–36.0)
MCV: 78.4 fL (ref 78.0–100.0)
Monocytes Absolute: 0.6 10*3/uL (ref 0.1–1.0)
Monocytes Relative: 4 %
NEUTROS PCT: 88 %
Neutro Abs: 12.5 10*3/uL — ABNORMAL HIGH (ref 1.7–7.7)
Platelets: 395 10*3/uL (ref 150–400)
RBC: 3.8 MIL/uL — AB (ref 3.87–5.11)
RDW: 16.5 % — ABNORMAL HIGH (ref 11.5–15.5)
WBC: 14.1 10*3/uL — AB (ref 4.0–10.5)

## 2018-01-11 LAB — BASIC METABOLIC PANEL
Anion gap: 11 (ref 5–15)
BUN: 11 mg/dL (ref 6–20)
CO2: 19 mmol/L — ABNORMAL LOW (ref 22–32)
Calcium: 8.8 mg/dL — ABNORMAL LOW (ref 8.9–10.3)
Chloride: 109 mmol/L (ref 101–111)
Creatinine, Ser: 0.88 mg/dL (ref 0.44–1.00)
GFR calc Af Amer: >60 mL/min (ref >60)
GFR calc non Af Amer: >60 mL/min (ref >60)
GLUCOSE: 106 mg/dL — AB (ref 65–99)
POTASSIUM: 3.8 mmol/L (ref 3.5–5.1)
Sodium: 139 mmol/L (ref 135–145)

## 2018-01-11 LAB — D-DIMER, QUANTITATIVE (NOT AT ARMC): D DIMER QUANT: 2.36 ug{FEU}/mL — AB (ref 0.00–0.50)

## 2018-01-11 LAB — TROPONIN I

## 2018-01-11 LAB — BRAIN NATRIURETIC PEPTIDE: B Natriuretic Peptide: 383 pg/mL — ABNORMAL HIGH (ref 0.0–100.0)

## 2018-01-11 MED ORDER — BUMETANIDE 1 MG PO TABS
1.0000 mg | ORAL_TABLET | Freq: Every day | ORAL | Status: DC
  Administered 2018-01-11: 1 mg via ORAL
  Filled 2018-01-11 (×3): qty 1

## 2018-01-11 MED ORDER — DIPHENHYDRAMINE HCL 25 MG PO CAPS
50.0000 mg | ORAL_CAPSULE | Freq: Once | ORAL | Status: AC
  Administered 2018-01-11: 50 mg via ORAL
  Filled 2018-01-11: qty 2

## 2018-01-11 MED ORDER — FUROSEMIDE 10 MG/ML IJ SOLN
40.0000 mg | Freq: Two times a day (BID) | INTRAMUSCULAR | Status: DC
  Administered 2018-01-11 – 2018-01-12 (×2): 40 mg via INTRAVENOUS
  Filled 2018-01-11 (×2): qty 4

## 2018-01-11 MED ORDER — SODIUM CHLORIDE 0.9% FLUSH
3.0000 mL | Freq: Two times a day (BID) | INTRAVENOUS | Status: DC
  Administered 2018-01-11 – 2018-01-12 (×2): 3 mL via INTRAVENOUS

## 2018-01-11 MED ORDER — HYDROCORTISONE NA SUCCINATE PF 100 MG IJ SOLR
INTRAMUSCULAR | Status: AC
  Filled 2018-01-11: qty 4

## 2018-01-11 MED ORDER — ENOXAPARIN SODIUM 40 MG/0.4ML ~~LOC~~ SOLN
40.0000 mg | SUBCUTANEOUS | Status: DC
  Filled 2018-01-11: qty 0.4

## 2018-01-11 MED ORDER — OXYCODONE-ACETAMINOPHEN 5-325 MG PO TABS
1.0000 | ORAL_TABLET | Freq: Once | ORAL | Status: AC
  Administered 2018-01-11: 1 via ORAL
  Filled 2018-01-11: qty 1

## 2018-01-11 MED ORDER — SODIUM CHLORIDE 0.9 % IV SOLN: 250.0000 mL | INTRAVENOUS | Status: DC | PRN

## 2018-01-11 MED ORDER — DIPHENHYDRAMINE HCL 50 MG/ML IJ SOLN: 50.0000 mg | Freq: Once | INTRAMUSCULAR | Status: AC

## 2018-01-11 MED ORDER — IOPAMIDOL (ISOVUE-370) INJECTION 76%
100.0000 mL | Freq: Once | INTRAVENOUS | Status: AC | PRN
  Administered 2018-01-11: 100 mL via INTRAVENOUS

## 2018-01-11 MED ORDER — SODIUM CHLORIDE 0.9% FLUSH: 3.0000 mL | INTRAVENOUS | Status: DC | PRN

## 2018-01-11 MED ORDER — ACETAMINOPHEN 325 MG PO TABS: 650.0000 mg | ORAL_TABLET | ORAL | Status: DC | PRN

## 2018-01-11 MED ORDER — HYDROCORTISONE NA SUCCINATE PF 250 MG IJ SOLR
200.0000 mg | Freq: Once | INTRAMUSCULAR | Status: AC
  Administered 2018-01-11: 200 mg via INTRAVENOUS
  Filled 2018-01-11: qty 200

## 2018-01-11 MED ORDER — DOCUSATE SODIUM 100 MG PO CAPS
100.0000 mg | ORAL_CAPSULE | Freq: Every day | ORAL | Status: DC
  Administered 2018-01-11 – 2018-01-12 (×2): 100 mg via ORAL
  Filled 2018-01-11 (×2): qty 1

## 2018-01-11 MED ORDER — ONDANSETRON HCL 4 MG/2ML IJ SOLN: 4.0000 mg | Freq: Four times a day (QID) | INTRAMUSCULAR | Status: DC | PRN

## 2018-01-11 MED ORDER — CITALOPRAM HYDROBROMIDE 20 MG PO TABS
40.0000 mg | ORAL_TABLET | Freq: Every day | ORAL | Status: DC
  Administered 2018-01-11: 40 mg via ORAL
  Filled 2018-01-11: qty 2

## 2018-01-11 MED ORDER — METOPROLOL SUCCINATE ER 25 MG PO TB24
25.0000 mg | ORAL_TABLET | Freq: Every day | ORAL | Status: DC
  Administered 2018-01-11: 25 mg via ORAL
  Filled 2018-01-11 (×2): qty 1

## 2018-01-11 MED ORDER — IPRATROPIUM-ALBUTEROL 0.5-2.5 (3) MG/3ML IN SOLN
3.0000 mL | Freq: Once | RESPIRATORY_TRACT | Status: AC
  Administered 2018-01-11: 3 mL via RESPIRATORY_TRACT
  Filled 2018-01-11: qty 3

## 2018-01-11 MED ORDER — LORAZEPAM 1 MG PO TABS
1.0000 mg | ORAL_TABLET | Freq: Once | ORAL | Status: AC
  Administered 2018-01-11: 1 mg via ORAL
  Filled 2018-01-11: qty 1

## 2018-01-11 MED ORDER — QUETIAPINE FUMARATE 25 MG PO TABS
25.0000 mg | ORAL_TABLET | Freq: Every day | ORAL | Status: DC
  Administered 2018-01-11: 25 mg via ORAL
  Filled 2018-01-11: qty 1

## 2018-01-11 MED ORDER — SODIUM CHLORIDE 0.9 % IV BOLUS: 1000.0000 mL | Freq: Once | INTRAVENOUS | Status: DC

## 2018-01-11 NOTE — ED Triage Notes (Signed)
Pt sharp pain under her left breast that is worse when she coughs.     Pt given 324 mg of ASA.

## 2018-01-11 NOTE — H&P (Signed)
History and Physical    Theresa Crawford NUU:[id number] DOB: 12-29-1980 DOAMARLEAH Crawford PCP: Jerrell Belfast, [name]  Patient coming from: Home  I have personally briefly reviewed patient's old medical records in [facility]  Chief Complaint: Shortness of breath  HPI: Theresa Crawford is a 37 y.o. female with medical history significant of chronic diastolic congestive heart failure and mitral regurgitation, presents to the hospital with complaints of chest pain shortness of breath.  Patient has had progressive shortness of breath for several weeks now.  She reports worsening of her symptoms occurring last night when she woke up with substernal chest discomfort, radiating under her left breast.  She has associated worsening orthopnea and dyspnea on exertion.  She is also noticed a mild wheeze over the past several weeks.  She has had acute occasional cough which is productive of pink-colored sputum.  Denies any fever.  No nausea or vomiting.  She has been changed from Lasix to Bumex and back to Lasix over the past several months.  Reports that her urine output has not been good for quite some time now.  ED Course: Patient was noted to be dyspneic in the emergency room.  CT scan indicated small bilateral pleural effusions.  No evidence of pulmonary embolus.  EKG did not show any acute findings.  BNP mildly elevated at 380.  She is been referred for admission.  Review of Systems: As per HPI otherwise 10 point review of systems negative.    Past Medical History:  Diagnosis Date  . Chronic diastolic CHF (congestive heart failure) (HCC)   . History of open heart surgery   . Hypertension   . Mitral valve disease    mitral regurgitation  . Normocytic anemia   . Obesity   . Premature atrial contractions   . Pulmonary hypertension (HCC) 06/12/2017  . PVC's (premature ventricular contractions)    a. h/o palpitations with event monitor in 03/2017 showing NSR with PACs/PVCs.    Past Surgical History:    Procedure Laterality Date  . ABDOMINAL HYSTERECTOMY    . CESAREAN SECTION     X's 2  . MITRAL VALVE REPAIR  03/2013    [facility] in [place], [place]   . PARTIAL HYSTERECTOMY  2011   PID w/problem with fallopian tubes  . TEE WITHOUT CARDIOVERSION N/A 05/26/2016   Procedure: TRANSESOPHAGEAL ECHOCARDIOGRAM (TEE);  Surgeon: [name], MD;  Location: [facility];  Service: Cardiovascular;  Laterality: N/A;     reports that she has been smoking cigarettes.  She started smoking about 18 years ago. She has been smoking about 0.25 packs per day. She has never used smokeless tobacco. She reports that she does not drink alcohol or use drugs.  Allergies  Allergen Reactions  . Lisinopril Shortness Of Breath and Swelling    Throat swelling  . Penicillins Shortness Of Breath and Swelling    Has patient had a PCN reaction causing immediate rash, facial/tongue/throat swelling, SOB or lightheadedness with hypotension: Yes Has patient had a PCN reaction causing severe rash involving mucus membranes or skin necrosis: Yes Has patient had a PCN reaction that required hospitalization No Has patient had a PCN reaction occurring within the last 10 years: No If all of the above answers are "NO", then may proceed with Cephalosporin use.   Marland Kitchen Perflutren Lipid Microsphere Shortness Of Breath    Other reaction(s): Chest Pain/Tightness  . Bee Venom   . Contrast Media [Iodinated Diagnostic Agents] Hives and  Itching    Itching and hives to throat, neck, face and arms.   No difficulty breathing.   This was given at [facility] approximately 2015. Contrast Dye  . Shellfish Allergy     Family History  Problem Relation Age of Onset  . Heart failure Father        just received LVAD  . Heart disease Paternal Grandfather        stent placement     Prior to Admission medications   Medication Sig Start Date End Date Taking? Authorizing Provider  albuterol (PROVENTIL  HFA;VENTOLIN HFA) 108 (90 Base) MCG/ACT inhaler Inhale 1-2 puffs into the lungs every 6 (six) hours as needed for wheezing or shortness of breath.   Yes [provider]  citalopram (CELEXA) 40 MG tablet Take 40 mg by mouth at bedtime.    Yes [provider]  docusate sodium (COLACE) 100 MG capsule Take 100 mg by mouth daily.   Yes [provider]  EPINEPHrine (EPIPEN 2-PAK) 0.3 mg/0.3 mL IJ SOAJ injection Inject 0.3 Units into the muscle once as needed.   Yes [provider]  ferrous sulfate 325 (65 FE) MG tablet Take 325 mg by mouth 2 (two) times daily with a meal.   Yes [provider]  furosemide (LASIX) 40 MG tablet Take 1 tablet by mouth daily. 10/11/17  Yes [provider]  metoprolol succinate (TOPROL-XL) 25 MG 24 hr tablet Take 1 tablet (25 mg total) by mouth daily. 09/11/17  Yes Philip Aspen, Limmie Patricia, MD  potassium chloride SA (K-DUR,KLOR-CON) 20 MEQ tablet Take 2 tablets (40 mEq total) by mouth daily. 09/11/17 01/11/18 Yes Henderson Cloud, MD  Probiotic Product (PROBIOTIC DAILY PO) Take 1 capsule by mouth daily.   Yes [provider]  QUEtiapine (SEROQUEL) 25 MG tablet Take 25 mg by mouth at bedtime.    Yes [provider]  bumetanide (BUMEX) 2 MG tablet Take 1 tablet (2 mg total) by mouth 2 (two) times daily. Patient not taking: Reported on 01/11/2018 09/08/17   Jodelle Gross, NP  diazepam (VALIUM) 5 MG tablet Take 1 tablet (5 mg total) by mouth every 12 (twelve) hours as needed for muscle spasms (spasms). Patient not taking: Reported on 01/11/2018 11/23/17   Gilda Crease, MD  oxyCODONE-acetaminophen (PERCOCET) 5-325 MG tablet Take 1 tablet by mouth every 4 (four) hours as needed. Patient not taking: Reported on 01/11/2018 11/23/17   Gilda Crease, MD  traMADol (ULTRAM) 50 MG tablet Take 1 tablet (50 mg total) by mouth every 6 (six) hours as needed. Patient not taking: Reported on  01/11/2018 11/23/17   Gilda Crease, MD    Physical Exam: Vitals:   01/11/18 1430 01/11/18 1530 01/11/18 1548 01/11/18 1614  BP: 118/71  107/71 120/78  Pulse: 88 80  80  Resp:  (!) 29  (!) 22  Temp:    97.6 F (36.4 C)  TempSrc:    Oral  SpO2: 91% 98%  100%  Weight:    101.6 kg (223 lb 14.4 oz)  Height:     (1.626 m)    Constitutional: NAD, calm, comfortable Vitals:   01/11/18 1430 01/11/18 1530 01/11/18 1548 01/11/18 1614  BP: 118/71  107/71 120/78  Pulse: 88 80  80  Resp:  (!) 29  (!) 22  Temp:    97.6 F (36.4 C)  TempSrc:    Oral  SpO2: 91% 98%  100%  Weight:  101.6 kg (223 lb 14.4 oz)  Height:     (1.626 m)   Eyes: PERRL, lids and conjunctivae normal ENMT: Mucous membranes are moist. Posterior pharynx clear of any exudate or lesions.Normal dentition.  Neck: normal, supple, no masses, no thyromegaly Respiratory: clear to auscultation bilaterally, no wheezing, no crackles. Normal respiratory effort. No accessory muscle use.  Cardiovascular: Regular rate and rhythm, no murmurs / rubs / gallops. No extremity edema. 2+ pedal pulses. No carotid bruits.  Abdomen: no tenderness, no masses palpated. No hepatosplenomegaly. Bowel sounds positive.  Musculoskeletal: no clubbing / cyanosis. No joint deformity upper and lower extremities. Good ROM, no contractures. Normal muscle tone.  Skin: no rashes, lesions, ulcers. No induration Neurologic: CN 2-12 grossly intact. Sensation intact, DTR normal. Strength 5/5 in all 4.  Psychiatric: Normal judgment and insight. Alert and oriented x 3. Normal mood.    Labs on Admission: I have personally reviewed following labs and imaging studies  CBC: Recent Labs  Lab 01/06/18 1006 01/11/18 0420  WBC 13.1* 14.1*  NEUTROABS 11.2* 12.5*  HGB 9.1* 9.2*  HCT 29.4* 29.8*  MCV 79.5 78.4  PLT 361 395   Basic Metabolic Panel: Recent Labs  Lab 01/06/18 1006 01/11/18 0420  NA 139 139  K 3.3* 3.8  CL 108 109  CO2 22 19*    GLUCOSE 116* 106*  BUN 11 11  CREATININE 0.82 0.88  CALCIUM 8.6* 8.8*   GFR: Estimated Creatinine Clearance: 100.6 mL/min (by C-G formula based on SCr of 0.88 mg/dL). Liver Function Tests: No results for input(s): AST, ALT, ALKPHOS, BILITOT, PROT, ALBUMIN in the last 168 hours. No results for input(s): LIPASE, AMYLASE in the last 168 hours. No results for input(s): AMMONIA in the last 168 hours. Coagulation Profile: No results for input(s): INR, PROTIME in the last 168 hours. Cardiac Enzymes: Recent Labs  Lab 01/06/18 1006  TROPONINI <0.03   BNP (last 3 results) No results for input(s): PROBNP in the last 8760 hours. HbA1C: No results for input(s): HGBA1C in the last 72 hours. CBG: No results for input(s): GLUCAP in the last 168 hours. Lipid Profile: No results for input(s): CHOL, HDL, LDLCALC, TRIG, CHOLHDL, LDLDIRECT in the last 72 hours. Thyroid Function Tests: No results for input(s): TSH, T4TOTAL, FREET4, T3FREE, THYROIDAB in the last 72 hours. Anemia Panel: No results for input(s): VITAMINB12, FOLATE, FERRITIN, TIBC, IRON, RETICCTPCT in the last 72 hours. Urine analysis:    Component Value Date/Time   COLORURINE YELLOW 05/23/2016 2027   APPEARANCEUR CLEAR 05/23/2016 2027   LABSPEC <1.005 (L) 05/23/2016 2027   PHURINE 6.5 05/23/2016 2027   GLUCOSEU NEGATIVE 05/23/2016 2027   HGBUR NEGATIVE 05/23/2016 2027   BILIRUBINUR NEGATIVE 05/23/2016 2027   KETONESUR NEGATIVE 05/23/2016 2027   PROTEINUR NEGATIVE 05/23/2016 2027   NITRITE NEGATIVE 05/23/2016 2027   LEUKOCYTESUR TRACE (A) 05/23/2016 2027    Radiological Exams on Admission: Dg Chest 2 View  Result Date: 01/11/2018 CLINICAL DATA:  Sharp pain under the left breast worse with cough. History of CHF. EXAM: CHEST - 2 VIEW COMPARISON:  01/06/2018 FINDINGS: Postoperative changes in the mediastinum. Mild cardiac enlargement with pulmonary vascular congestion. Diffuse interstitial infiltrates consistent with edema.  Small bilateral pleural effusions greater on the right. Fluid in the minor fissure. No developing consolidation. Similar appearance to previous study. IMPRESSION: Cardiac enlargement with pulmonary vascular congestion, interstitial edema, and small bilateral pleural effusions. Electronically Signed   By: Burman Nieves M.D.   On: 01/11/2018 04:36   Ct  Angio Chest Pe W And/or Wo Contrast  Result Date: 01/11/2018 CLINICAL DATA:  Chest pain.  Productive cough. EXAM: CT ANGIOGRAPHY CHEST WITH CONTRAST TECHNIQUE: Multidetector CT imaging of the chest was performed using the standard protocol during bolus administration of intravenous contrast. Multiplanar CT image reconstructions and MIPs were obtained to evaluate the vascular anatomy. CONTRAST:  ISOVUE-370 IOPAMIDOL (ISOVUE-370) INJECTION 76% COMPARISON:  Radiographs of same day. CT scan of May 23, 2016. FINDINGS: Cardiovascular: Satisfactory opacification of the pulmonary arteries to the segmental level. No evidence of pulmonary embolism. Normal heart size. No pericardial effusion. Status post mitral valve repair. Mediastinum/Nodes: Thyroid gland and esophagus are unremarkable. Stable mediastinal adenopathy is noted, including calcified right paratracheal adenopathy. This most likely represents chronic inflammation or prior granulomatous disease. Lungs/Pleura: No pneumothorax is noted. Minimal bilateral pleural effusions are noted. Diffuse opacities are noted in both lungs, most prominently seen in the lung bases concerning for edema or less likely inflammation. Upper lobe opacities are improved compared to prior exam. Upper Abdomen: No acute abnormality. Musculoskeletal: No chest wall abnormality. No acute or significant osseous findings. Review of the MIP images confirms the above findings. IMPRESSION: No definite evidence of pulmonary embolus. Stable mediastinal adenopathy is noted, with calcified right paratracheal adenopathy, most consistent with  chronic inflammation or prior granulomatous disease. Diffuse opacities are noted in both lungs, most prominently seen in the lung bases, most consistent with edema or less likely inflammation. Minimal bilateral pleural effusions are noted. Electronically Signed   By: Lupita Raider, M.D.   On: 01/11/2018 10:52    EKG: Independently reviewed.  No acute ST-T changes  Assessment/Plan Active Problems:   Essential hypertension   Acute respiratory failure with hypoxia (HCC)   Acute on chronic diastolic CHF (congestive heart failure) (HCC)   Anemia, iron deficiency   Pulmonary hypertension (HCC)   Dyspnea     1. Acute on chronic diastolic congestive heart failure.  Start the patient on intravenous Lasix.  Monitor intake and output.  Cardiology following.  She is already on beta-blockers.  Not a candidate for ACE inhibitors due to allergy. 2. Chest pain.  Likely related to decompensated CHF.  Cycle cardiac markers. 3. Acute respiratory failure with hypoxia.  Related to CHF exacerbation.  Wean off oxygen as tolerated. 4. Pulmonary hypertension.  Related to diastolic heart failure. 5. Hypertension.  Continue on beta-blockers.  Blood pressure currently stable.   6. Iron deficiency anemia.  Chronic.  Hemoglobin is currently near baseline 7. Mitral regurgitation.  Plan for outpatient TEE when she is able to lay flat.  DVT prophylaxis: Lovenox Code Status: Full code Family Communication: No family present Disposition Plan: Discharge home once improved Consults called: Cardiology Admission status: Observation, telemetry  Erick Blinks MD [facility] Pager [phone]  If 7PM-7AM, please contact night-coverage [email or web address] Password [facility]  01/11/2018, 6:07 PM

## 2018-01-11 NOTE — H&P (View-Only) (Signed)
Cardiology Consultation:   Patient ID: Theresa Crawford; [id number]; 04/04/1981   Admit date: 01/11/2018 Date of Consult: 01/11/2018  Primary Care Provider: House, [name], [name] Primary Cardiologist: Suresh Koneswaran, MD    Patient Profile:   Theresa Crawford is a 37 y.o. female with a hx of mitral valve repair and chronic diastolic heart failure who is being seen today for the evaluation of shortness of breath at the request of Dr. Cook.  History of Present Illness:   Theresa Crawford is a 37-year-old woman with a history of mitral valve repair and chronic diastolic heart failure who presented to the ED today with chest pain and shortness of breath.  She got a drink early this morning and suddenly had retrosternal chest pain.  Shortly thereafter she developed a cough and shortness of breath and came to the ED.  Chest pain is made worse with deep inspiration.  I reviewed the chest x-ray which showed cardiac enlargement with pulmonary vascular congestion, interstitial edema, and small bilateral pleural effusions.  I reviewed the CT angiogram of the chest which showed no evidence of pulmonary embolism.  There was stable mediastinal adenopathy with chronic inflammation versus prior granulomatous disease.  There were minimal bilateral pleural effusions.  Relevant labs: Elevated BNP of 383, elevated d-dimer of 2.36, BUN 11, creatinine 0.88, elevated white blood cells 14.1, hemoglobin low at 9.2. BNP was 314 on 01/06/2018.  In the ED she was tachycardic up to 114 bpm.  She was tachypneic as well.  She was given a DuoNeb with some improvement in her symptoms.  Oxygen saturations were in the upper 80s/low 90 range.  She was also given Bumex 1 mg with improvement in her symptoms.  She was switched to Bumex from Lasix by K. Lawrence DNP on 09/08/2017.  However, she tells me she has been taking Lasix which has not helped her as much as Bumex.  Discharge summary on 09/11/2017 indicates she was discharged on  Bumex 2 mg twice daily.  Most recent echocardiogram performed on 09/10/2017 demonstrated normal left ventricular systolic function and regional wall motion, LVEF 55 to 60%, evidence of prior mitral repair with mildly restricted leaflet motion posteriorly.  Valve area by pressure half-time was 2.347 m.  It was 0.61 cm by the continuity equation.  There was mild to moderate left atrial dilatation and mild to moderate right atrial dilatation with moderate tricuspid regurgitation and moderate to severely increased pulmonary pressures, 67 mmHg.  This is in contrast to the echocardiogram performed on 06/11/2017 which demonstrated what appeared to be severe mitral stenosis.  Valve area by continuity equation was 0.69 cm.  At the time of my evaluation she is feeling somewhat better.  At home she sleeps on 5 pillows as she is unable to lie flat.  She thinks part of her inability to lie flat not only has to do with her breathing but also due to anxiety.  Mitral valve repair was performed in [place], [place].  Last TEE was in September 2017.   Past Medical History:  Diagnosis Date  . Chronic diastolic CHF (congestive heart failure) (HCC)   . History of open heart surgery   . Hypertension   . Mitral valve disease    mitral regurgitation  . Normocytic anemia   . Obesity   . Premature atrial contractions   . Pulmonary hypertension (HCC) 06/12/2017  . PVC's (premature ventricular contractions)    a. h/o palpitations with event monitor in 03/2017 showing NSR with PACs/PVCs.      Past Surgical History:  Procedure Laterality Date  . ABDOMINAL HYSTERECTOMY    . CESAREAN SECTION     X's 2  . MITRAL VALVE REPAIR  03/2013    [facility] in [place], [place]   . PARTIAL HYSTERECTOMY  2011   PID w/problem with fallopian tubes  . TEE WITHOUT CARDIOVERSION N/A 05/26/2016   Procedure: TRANSESOPHAGEAL ECHOCARDIOGRAM (TEE);  Surgeon: Suresh A Koneswaran, MD;  Location: [facility];   Service: Cardiovascular;  Laterality: N/A;       Inpatient Medications: Scheduled Meds: . bumetanide  1 mg Oral Daily   Continuous Infusions:  PRN Meds:   Allergies:    Allergies  Allergen Reactions  . Lisinopril Shortness Of Breath and Swelling    Throat swelling  . Penicillins Shortness Of Breath and Swelling    Has patient had a PCN reaction causing immediate rash, facial/tongue/throat swelling, SOB or lightheadedness with hypotension: Yes Has patient had a PCN reaction causing severe rash involving mucus membranes or skin necrosis: Yes Has patient had a PCN reaction that required hospitalization No Has patient had a PCN reaction occurring within the last 10 years: No If all of the above answers are "NO", then may proceed with Cephalosporin use.   . Perflutren Lipid Microsphere Shortness Of Breath    Other reaction(s): Chest Pain/Tightness  . Bee Venom   . Contrast Media [Iodinated Diagnostic Agents] Hives and Itching    Itching and hives to throat, neck, face and arms.   No difficulty breathing.   This was given at [facility] approximately 2015. Contrast Dye  . Shellfish Allergy     Social History:   Social History   Socioeconomic History  . Marital status: Single    Spouse name: Not on file  . Number of children: Not on file  . Years of education: Not on file  . Highest education level: Not on file  Occupational History  . Not on file  Social Needs  . Financial resource strain: Not on file  . Food insecurity:    Worry: Not on file    Inability: Not on file  . Transportation needs:    Medical: Not on file    Non-medical: Not on file  Tobacco Use  . Smoking status: Current Some Day Smoker    Packs/day: 0.25    Types: Cigarettes    Start date: 11/03/1999  . Smokeless tobacco: Never Used  Substance and Sexual Activity  . Alcohol use: No    Alcohol/week: 0.0 oz  . Drug use: No  . Sexual activity: Not on file  Lifestyle  . Physical  activity:    Days per week: Not on file    Minutes per session: Not on file  . Stress: Not on file  Relationships  . Social connections:    Talks on phone: Not on file    Gets together: Not on file    Attends religious service: Not on file    Active member of club or organization: Not on file    Attends meetings of clubs or organizations: Not on file    Relationship status: Not on file  . Intimate partner violence:    Fear of current or ex partner: Not on file    Emotionally abused: Not on file    Physically abused: Not on file    Forced sexual activity: Not on file  Other Topics Concern  . Not on file  Social History Narrative  . Not on file      Family History:   No premature CAD.  Family History  Problem Relation Age of Onset  . Heart failure Father        just received LVAD  . Heart disease Paternal Grandfather        stent placement     ROS:  Please see the history of present illness.   All other ROS reviewed and negative.     Physical Exam/Data:   Vitals:   01/11/18 1430 01/11/18 1530 01/11/18 1548 01/11/18 1614  BP: 118/71  107/71 120/78  Pulse: 88 80  80  Resp:  (!) 29  (!) 22  Temp:    97.6 F (36.4 C)  TempSrc:    Oral  SpO2: 91% 98%  100%  Weight:    223 lb 14.4 oz (101.6 kg)  Height:    5' 4" (1.626 m)   No intake or output data in the 24 hours ending 01/11/18 1655 Filed Weights   01/11/18 0405 01/11/18 1614  Weight: 231 lb (104.8 kg) 223 lb 14.4 oz (101.6 kg)   Body mass index is 38.43 kg/m.  General:  Well nourished, well developed, in no acute distress HEENT: normal Lymph: no adenopathy Neck: no JVD Endocrine:  No thryomegaly Cardiac:  normal S1, S2; RRR; no significant murmurs Lungs:  clear to auscultation bilaterally, no wheezing, rhonchi or rales  Abd: soft, nontender, no hepatomegaly  Ext: no edema Musculoskeletal:  No deformities, BUE and BLE strength normal and equal Skin: warm and dry  Neuro:  CNs 2-12 intact, no focal  abnormalities noted Psych:  Normal affect    Relevant CV Studies: Echo reviewed above  Laboratory Data:  Chemistry Recent Labs  Lab 01/06/18 1006 01/11/18 0420  NA 139 139  K 3.3* 3.8  CL 108 109  CO2 22 19*  GLUCOSE 116* 106*  BUN 11 11  CREATININE 0.82 0.88  CALCIUM 8.6* 8.8*  GFRNONAA >60 >60  GFRAA >60 >60  ANIONGAP 9 11    No results for input(s): PROT, ALBUMIN, AST, ALT, ALKPHOS, BILITOT in the last 168 hours. Hematology Recent Labs  Lab 01/06/18 1006 01/11/18 0420  WBC 13.1* 14.1*  RBC 3.70* 3.80*  HGB 9.1* 9.2*  HCT 29.4* 29.8*  MCV 79.5 78.4  MCH 24.6* 24.2*  MCHC 31.0 30.9  RDW 16.6* 16.5*  PLT 361 395   Cardiac Enzymes Recent Labs  Lab 01/06/18 1006  TROPONINI <0.03   No results for input(s): TROPIPOC in the last 168 hours.  BNP Recent Labs  Lab 01/06/18 1006 01/11/18 0420  BNP 314.0* 383.0*    DDimer  Recent Labs  Lab 01/11/18 0420  DDIMER 2.36*    Radiology/Studies:  Dg Chest 2 View  Result Date: 01/11/2018 CLINICAL DATA:  Sharp pain under the left breast worse with cough. History of CHF. EXAM: CHEST - 2 VIEW COMPARISON:  01/06/2018 FINDINGS: Postoperative changes in the mediastinum. Mild cardiac enlargement with pulmonary vascular congestion. Diffuse interstitial infiltrates consistent with edema. Small bilateral pleural effusions greater on the right. Fluid in the minor fissure. No developing consolidation. Similar appearance to previous study. IMPRESSION: Cardiac enlargement with pulmonary vascular congestion, interstitial edema, and small bilateral pleural effusions. Electronically Signed   By: William  Stevens M.D.   On: 01/11/2018 04:36   Ct Angio Chest Pe W And/or Wo Contrast  Result Date: 01/11/2018 CLINICAL DATA:  Chest pain.  Productive cough. EXAM: CT ANGIOGRAPHY CHEST WITH CONTRAST TECHNIQUE: Multidetector CT imaging of the chest was performed using the standard protocol during   bolus administration of intravenous contrast.  Multiplanar CT image reconstructions and MIPs were obtained to evaluate the vascular anatomy. CONTRAST:  100mL ISOVUE-370 IOPAMIDOL (ISOVUE-370) INJECTION 76% COMPARISON:  Radiographs of same day. CT scan of May 23, 2016. FINDINGS: Cardiovascular: Satisfactory opacification of the pulmonary arteries to the segmental level. No evidence of pulmonary embolism. Normal heart size. No pericardial effusion. Status post mitral valve repair. Mediastinum/Nodes: Thyroid gland and esophagus are unremarkable. Stable mediastinal adenopathy is noted, including calcified right paratracheal adenopathy. This most likely represents chronic inflammation or prior granulomatous disease. Lungs/Pleura: No pneumothorax is noted. Minimal bilateral pleural effusions are noted. Diffuse opacities are noted in both lungs, most prominently seen in the lung bases concerning for edema or less likely inflammation. Upper lobe opacities are improved compared to prior exam. Upper Abdomen: No acute abnormality. Musculoskeletal: No chest wall abnormality. No acute or significant osseous findings. Review of the MIP images confirms the above findings. IMPRESSION: No definite evidence of pulmonary embolus. Stable mediastinal adenopathy is noted, with calcified right paratracheal adenopathy, most consistent with chronic inflammation or prior granulomatous disease. Diffuse opacities are noted in both lungs, most prominently seen in the lung bases, most consistent with edema or less likely inflammation. Minimal bilateral pleural effusions are noted. Electronically Signed   By: James  Green Jr, M.D.   On: 01/11/2018 10:52    Assessment and Plan:   1.  Acute on chronic diastolic heart failure: Symptoms have improved somewhat since receiving a DuoNeb and Bumex.  She has been orthopneic for some time.  I will start IV Lasix 40 mg twice daily.  She will need to be able to lie flat in order to perform a TEE adequately.  I will arrange this to be performed  in the outpatient setting next week either at [facility] or [place] if she is able to get a ride.  She said she does not drive. She tells me she ran out of Bumex quite some time back and has been taking Lasix which does not work as well.  However the discharge summary on 09/11/2017 indicates she was discharged on Bumex 2 mg twice daily.  2.  History of mitral valve repair: As noted above, there were discrepancies in the echocardiograms with regards to the severity of mitral stenosis in September 2018 in December 2018.  Her last TEE was in September 2017. She will need to be able to lie more flat in order to perform a TEE adequately.  I will arrange this to be performed in the outpatient setting next week either at [facility] or [place] if she is able to get a ride.  She said she does not drive.  3.  Pulmonary hypertension: This is secondary to diastolic heart failure which is likely due to post mitral valve repair mitral stenosis.   For questions or updates, please contact [facility] Please consult [email or web address] for contact info under Cardiology/STEMI.   Signed, Suresh Koneswaran, MD  01/11/2018 4:55 PM  

## 2018-01-11 NOTE — ED Provider Notes (Signed)
[facility] EMERGENCY DEPARTMENT Provider Note   CSN: [id number] Arrival date & time: 01/11/18  0358     History   Chief Complaint Chief Complaint  Patient presents with  . Chest Pain    HPI Theresa Crawford is a 37 y.o. female.  HPI  This is a 37 year old female with a history of diastolic heart failure, obesity, pulmonary hypertension who presents with shortness of breath and chest pain.  Reports that she got up from bed tonight feeling short of breath.  She states that she had acute onset of sharp anterior chest pain that is worse with coughing and breathing.  Currently it is 7 out of 10.  She reports a productive cough with occasional blood-tinged sputum.  She denies any recent fevers.  She denies any history of blood clots, recent travel, recent hospitalization, estrogen use.  She states that she quit smoking 2 days ago but does have a fairly significant smoking history.  She does not use nebulizers at home.  Past Medical History:  Diagnosis Date  . Chronic diastolic CHF (congestive heart failure) (HCC)   . History of open heart surgery   . Hypertension   . Mitral valve disease    mitral regurgitation  . Normocytic anemia   . Obesity   . Premature atrial contractions   . Pulmonary hypertension (HCC) 06/12/2017  . PVC's (premature ventricular contractions)    a. h/o palpitations with event monitor in 03/2017 showing NSR with PACs/PVCs.    Patient Active Problem List   Diagnosis Date Noted  . Prolonged QT interval 09/10/2017  . Normochromic normocytic anemia   . Pulmonary edema 07/05/2017  . Hypokalemia 06/12/2017  . Pulmonary hypertension (HCC) 06/12/2017  . Flash pulmonary edema (HCC) 06/11/2017  . Depression with anxiety 06/11/2017  . Mitral valve stenosis, severe 06/11/2017  . Acute pulmonary edema (HCC) 10/10/2016  . S/P mitral valve repair   . CAP (community acquired pneumonia) 05/25/2016  . Leukocytosis 05/24/2016  . Acute respiratory failure with hypoxia  (HCC) 05/23/2016  . Acute on chronic diastolic CHF (congestive heart failure) (HCC) 05/23/2016  . Cough with hemoptysis 05/23/2016  . Postpartum complication pericarditis in 2009 with eventual needing MVR in 2015 05/23/2016  . Anemia, iron deficiency 05/23/2016  . Hypertension   . SOB (shortness of breath)   . Respiratory distress 02/16/2016  . Essential hypertension 02/16/2016    Past Surgical History:  Procedure Laterality Date  . ABDOMINAL HYSTERECTOMY    . CESAREAN SECTION     X's 2  . MITRAL VALVE REPAIR  03/2013    [facility] in [place], [place]   . PARTIAL HYSTERECTOMY  2011   PID w/problem with fallopian tubes  . TEE WITHOUT CARDIOVERSION N/A 05/26/2016   Procedure: TRANSESOPHAGEAL ECHOCARDIOGRAM (TEE);  Surgeon: [name], MD;  Location: [facility];  Service: Cardiovascular;  Laterality: N/A;     OB History    Gravida  2   Para  2   Term  2   Preterm      AB      Living        SAB      TAB      Ectopic      Multiple      Live Births               Home Medications    Prior to Admission medications   Medication Sig Start Date End Date Taking? Authorizing Provider  bumetanide (BUMEX) 2  MG tablet Take 1 tablet (2 mg total) by mouth 2 (two) times daily. 09/08/17   Jodelle Gross, NP  citalopram (CELEXA) 40 MG tablet Take 40 mg by mouth at bedtime.     [provider]  diazepam (VALIUM) 5 MG tablet Take 1 tablet (5 mg total) by mouth every 12 (twelve) hours as needed for muscle spasms (spasms). 11/23/17   Gilda Crease, MD  docusate sodium (COLACE) 100 MG capsule Take 100 mg by mouth daily.    [provider]  EPINEPHrine (EPIPEN 2-PAK) 0.3 mg/0.3 mL IJ SOAJ injection Inject 0.3 Units into the muscle once as needed.    [provider]  ferrous sulfate 325 (65 FE) MG tablet Take 325 mg by mouth 2 (two) times daily with a meal.    [provider]  metoprolol succinate (TOPROL-XL)  25 MG 24 hr tablet Take 1 tablet (25 mg total) by mouth daily. 09/11/17   Philip Aspen, Limmie Patricia, MD  oxyCODONE-acetaminophen (PERCOCET) 5-325 MG tablet Take 1 tablet by mouth every 4 (four) hours as needed. 11/23/17   Pollina, Canary Brim, MD  potassium chloride SA (K-DUR,KLOR-CON) 20 MEQ tablet Take 2 tablets (40 mEq total) by mouth daily. 09/11/17 10/11/17  Philip Aspen, Limmie Patricia, MD  predniSONE (DELTASONE) 20 MG tablet Take 1 tablet (20 mg total) by mouth 2 (two) times daily. 01/06/18   Mancel Bale, MD  Probiotic Product (PROBIOTIC DAILY PO) Take 1 capsule by mouth daily.    [provider]  QUEtiapine (SEROQUEL) 25 MG tablet Take 25 mg by mouth at bedtime.     [provider]  traMADol (ULTRAM) 50 MG tablet Take 1 tablet (50 mg total) by mouth every 6 (six) hours as needed. 11/23/17   Gilda Crease, MD    Family History Family History  Problem Relation Age of Onset  . Heart failure Father        just received LVAD  . Heart disease Paternal Grandfather        stent placement    Social History Social History   Tobacco Use  . Smoking status: Current Some Day Smoker    Packs/day: 0.25    Types: Cigarettes    Start date: 11/03/1999  . Smokeless tobacco: Never Used  Substance Use Topics  . Alcohol use: No    Alcohol/week: 0.0 oz  . Drug use: No     Allergies   Lisinopril; Penicillins; Perflutren lipid microsphere; Bee venom; Contrast media [iodinated diagnostic agents]; and Shellfish allergy   Review of Systems Review of Systems  Constitutional: Negative for fever.  Respiratory: Positive for cough and shortness of breath.   Cardiovascular: Positive for chest pain. Negative for leg swelling.  Gastrointestinal: Negative for nausea and vomiting.  Genitourinary: Negative for dysuria.  All other systems reviewed and are negative.    Physical Exam Updated Vital Signs Pulse (!) 114   Temp 99.2 F (37.3 C) (Oral)   Resp (!) 28   Ht 5'  4" (1.626 m)   Wt 104.8 kg (231 lb)   LMP 01/03/2018   SpO2 94%   BMI 39.65 kg/m   Physical Exam  Constitutional: She is oriented to person, place, and time.  Overweight, tachypneic  HENT:  Head: Normocephalic and atraumatic.  Cardiovascular: Regular rhythm, normal heart sounds and normal pulses. Tachycardia present.  Pulmonary/Chest: Tachypnea noted. No respiratory distress. She has no wheezes.  Occasional expiratory wheeze, fair air movement, tachypnea noted, taking in short sentences  Abdominal:  Soft. Bowel sounds are normal.  Musculoskeletal:       Right lower leg: She exhibits no edema.       Left lower leg: She exhibits no edema.  Neurological: She is alert and oriented to person, place, and time.  Skin: Skin is warm and dry.  Psychiatric: She has a normal mood and affect.  Nursing note and vitals reviewed.    ED Treatments / Results  Labs (all labs ordered are listed, but only abnormal results are displayed) Labs Reviewed  CBC WITH DIFFERENTIAL/PLATELET - Abnormal; Notable for the following components:      Result Value   WBC 14.1 (*)    RBC 3.80 (*)    Hemoglobin 9.2 (*)    HCT 29.8 (*)    MCH 24.2 (*)    RDW 16.5 (*)    Neutro Abs 12.5 (*)    All other components within normal limits  BASIC METABOLIC PANEL - Abnormal; Notable for the following components:   CO2 19 (*)    Glucose, Bld 106 (*)    Calcium 8.8 (*)    All other components within normal limits  D-DIMER, QUANTITATIVE (NOT AT [facility]) - Abnormal; Notable for the following components:   D-Dimer, Quant 2.36 (*)    All other components within normal limits    EKG None  Radiology Dg Chest 2 View  Result Date: 01/11/2018 CLINICAL DATA:  Sharp pain under the left breast worse with cough. History of CHF. EXAM: CHEST - 2 VIEW COMPARISON:  01/06/2018 FINDINGS: Postoperative changes in the mediastinum. Mild cardiac enlargement with pulmonary vascular congestion. Diffuse interstitial infiltrates consistent  with edema. Small bilateral pleural effusions greater on the right. Fluid in the minor fissure. No developing consolidation. Similar appearance to previous study. IMPRESSION: Cardiac enlargement with pulmonary vascular congestion, interstitial edema, and small bilateral pleural effusions. Electronically Signed   By: Burman Nieves M.D.   On: 01/11/2018 04:36    Procedures Procedures (including critical care time)    EMERGENCY DEPARTMENT [place] CARDIAC EXAM "Study: Limited Ultrasound of the Heart and Pericardium"  INDICATIONS:Abnormal vital signs, Chest pain and Dyspnea Multiple views of the heart and pericardium were obtained in real-time with a multi-frequency probe.  PERFORMED ZO:XWRUEA IMAGES ARCHIVED?: Yes LIMITATIONS:  Body habitus and Emergent procedure VIEWS USED: Subcostal 4 chamber, Parasternal long axis and Parasternal short axis INTERPRETATION: Pericardial effusioin absent, Cardiac tamponade absent and Normal contractility    Medications Ordered in ED Medications  hydrocortisone sodium succinate (SOLU-CORTEF) injection 200 mg (has no administration in time range)  diphenhydrAMINE (BENADRYL) capsule 50 mg (has no administration in time range)    Or  diphenhydrAMINE (BENADRYL) injection 50 mg (has no administration in time range)  ipratropium-albuterol (DUONEB) 0.5-2.5 (3) MG/3ML nebulizer solution 3 mL (has no administration in time range)  sodium chloride 0.9 % bolus 1,000 mL (has no administration in time range)     Initial Impression / Assessment and Plan / ED Course  I have reviewed the triage vital signs and the nursing notes.  Pertinent labs & imaging results that were available during my care of the patient were reviewed by me and considered in my medical decision making (see chart for details).  Clinical Course as of Jan 11 634  Wed Jan 11, 2018  5409 Patient resting.  Heart rate now 102.  She is satting 88% on room air.  Blood pressure continues to be normal.   Work-up was suggestive of vascular congestion and possible heart failure.  Patient  does not clinically appear volume overloaded.  Positive d-dimer.  Given vital signs, feel that she needs CT scan to further evaluate.  She will need premedication.  At this time she is hemodynamically stable.  Will not empirically treat with heparin.  If she became unstable, would consider treating with heparin.   [CH]    Clinical Course User Index [CH] Varshini Arrants, Mayer Masker, MD    Patient presents with shortness of breath.  She is tachypneic on exam and tachycardic.  She has an occasional wheeze but this does not correlate clinically with her respiratory status.  She does report a cough.  Chest x-ray shows no evidence of pneumonia but does show evidence of volume overload and pleural effusion.  Clinically she does not appear volume overloaded.  Given her tachycardia, d-dimer was sent.  This is elevated.  She has a contrast allergy and will need premedication prior to CT scan.  Given her smoking history and wheezing, she was given a DuoNeb.  She does report some improvement of symptoms after DuoNeb.  She remains mildly tachycardic with O2 sats in the upper 80s and low 90s.  Bedside ultrasound does not show any right heart strain or pericardial effusion.  Patient signed out pending CT scan.  Final Clinical Impressions(s) / ED Diagnoses   Final diagnoses:  None    ED Discharge Orders    None       Shon Baton, MD 01/11/18 [phone]

## 2018-01-11 NOTE — ED Provider Notes (Signed)
Patient has persistent dyspnea and tachypnea.  Will consult cardiology and admit to general medicine.   Donnetta Hutching, MD 01/11/18 1351

## 2018-01-11 NOTE — Consult Note (Signed)
Cardiology Consultation:   Patient ID: Theresa Crawford; [id number]; 1980-10-15   Admit date: 01/11/2018 Date of Consult: 01/11/2018  Primary Care Provider: Jerrell Belfast, [name] Primary Cardiologist: Prentice Docker, MD    Patient Profile:   Theresa Crawford is a 37 y.o. female with a hx of mitral valve repair and chronic diastolic heart failure who is being seen today for the evaluation of shortness of breath at the request of Dr. Adriana Simas.  History of Present Illness:   Theresa Crawford is a 37 year old woman with a history of mitral valve repair and chronic diastolic heart failure who presented to the ED today with chest pain and shortness of breath.  She got a drink early this morning and suddenly had retrosternal chest pain.  Shortly thereafter she developed a cough and shortness of breath and came to the ED.  Chest pain is made worse with deep inspiration.  I reviewed the chest x-ray which showed cardiac enlargement with pulmonary vascular congestion, interstitial edema, and small bilateral pleural effusions.  I reviewed the CT angiogram of the chest which showed no evidence of pulmonary embolism.  There was stable mediastinal adenopathy with chronic inflammation versus prior granulomatous disease.  There were minimal bilateral pleural effusions.  Relevant labs: Elevated BNP of 383, elevated d-dimer of 2.36, BUN 11, creatinine 0.88, elevated white blood cells 14.1, hemoglobin low at 9.2. BNP was 314 on 01/06/2018.  In the ED she was tachycardic up to 114 bpm.  She was tachypneic as well.  She was given a DuoNeb with some improvement in her symptoms.  Oxygen saturations were in the upper 80s/low 90 range.  She was also given Bumex 1 mg with improvement in her symptoms.  She was switched to Bumex from Lasix by K. Lawrence DNP on 09/08/2017.  However, she tells me she has been taking Lasix which has not helped her as much as Bumex.  Discharge summary on 09/11/2017 indicates she was discharged on  Bumex 2 mg twice daily.  Most recent echocardiogram performed on 09/10/2017 demonstrated normal left ventricular systolic function and regional wall motion, LVEF 55 to 60%, evidence of prior mitral repair with mildly restricted leaflet motion posteriorly.  Valve area by pressure half-time was 2.347 m.  It was 0.61 cm by the continuity equation.  There was mild to moderate left atrial dilatation and mild to moderate right atrial dilatation with moderate tricuspid regurgitation and moderate to severely increased pulmonary pressures, 67 mmHg.  This is in contrast to the echocardiogram performed on 06/11/2017 which demonstrated what appeared to be severe mitral stenosis.  Valve area by continuity equation was 0.69 cm.  At the time of my evaluation she is feeling somewhat better.  At home she sleeps on 5 pillows as she is unable to lie flat.  She thinks part of her inability to lie flat not only has to do with her breathing but also due to anxiety.  Mitral valve repair was performed in [place], [place].  Last TEE was in September 2017.   Past Medical History:  Diagnosis Date  . Chronic diastolic CHF (congestive heart failure) (HCC)   . History of open heart surgery   . Hypertension   . Mitral valve disease    mitral regurgitation  . Normocytic anemia   . Obesity   . Premature atrial contractions   . Pulmonary hypertension (HCC) 06/12/2017  . PVC's (premature ventricular contractions)    a. h/o palpitations with event monitor in 03/2017 showing NSR with PACs/PVCs.  Past Surgical History:  Procedure Laterality Date  . ABDOMINAL HYSTERECTOMY    . CESAREAN SECTION     X's 2  . MITRAL VALVE REPAIR  03/2013    [facility] in [place], [place]   . PARTIAL HYSTERECTOMY  2011   PID w/problem with fallopian tubes  . TEE WITHOUT CARDIOVERSION N/A 05/26/2016   Procedure: TRANSESOPHAGEAL ECHOCARDIOGRAM (TEE);  Surgeon: [name], MD;  Location: [facility];   Service: Cardiovascular;  Laterality: N/A;       Inpatient Medications: Scheduled Meds: . bumetanide  1 mg Oral Daily   Continuous Infusions:  PRN Meds:   Allergies:    Allergies  Allergen Reactions  . Lisinopril Shortness Of Breath and Swelling    Throat swelling  . Penicillins Shortness Of Breath and Swelling    Has patient had a PCN reaction causing immediate rash, facial/tongue/throat swelling, SOB or lightheadedness with hypotension: Yes Has patient had a PCN reaction causing severe rash involving mucus membranes or skin necrosis: Yes Has patient had a PCN reaction that required hospitalization No Has patient had a PCN reaction occurring within the last 10 years: No If all of the above answers are "NO", then may proceed with Cephalosporin use.   Marland Kitchen Perflutren Lipid Microsphere Shortness Of Breath    Other reaction(s): Chest Pain/Tightness  . Bee Venom   . Contrast Media [Iodinated Diagnostic Agents] Hives and Itching    Itching and hives to throat, neck, face and arms.   No difficulty breathing.   This was given at [facility] approximately 2015. Contrast Dye  . Shellfish Allergy     Social History:   Social History   Socioeconomic History  . Marital status: Single    Spouse name: Not on file  . Number of children: Not on file  . Years of education: Not on file  . Highest education level: Not on file  Occupational History  . Not on file  Social Needs  . Financial resource strain: Not on file  . Food insecurity:    Worry: Not on file    Inability: Not on file  . Transportation needs:    Medical: Not on file    Non-medical: Not on file  Tobacco Use  . Smoking status: Current Some Day Smoker    Packs/day: 0.25    Types: Cigarettes    Start date: 11/03/1999  . Smokeless tobacco: Never Used  Substance and Sexual Activity  . Alcohol use: No    Alcohol/week: 0.0 oz  . Drug use: No  . Sexual activity: Not on file  Lifestyle  . Physical  activity:    Days per week: Not on file    Minutes per session: Not on file  . Stress: Not on file  Relationships  . Social connections:    Talks on phone: Not on file    Gets together: Not on file    Attends religious service: Not on file    Active member of club or organization: Not on file    Attends meetings of clubs or organizations: Not on file    Relationship status: Not on file  . Intimate partner violence:    Fear of current or ex partner: Not on file    Emotionally abused: Not on file    Physically abused: Not on file    Forced sexual activity: Not on file  Other Topics Concern  . Not on file  Social History Narrative  . Not on file  Family History:   No premature CAD.  Family History  Problem Relation Age of Onset  . Heart failure Father        just received LVAD  . Heart disease Paternal Grandfather        stent placement     ROS:  Please see the history of present illness.   All other ROS reviewed and negative.     Physical Exam/Data:   Vitals:   01/11/18 1430 01/11/18 1530 01/11/18 1548 01/11/18 1614  BP: 118/71  107/71 120/78  Pulse: 88 80  80  Resp:  (!) 29  (!) 22  Temp:    97.6 F (36.4 C)  TempSrc:    Oral  SpO2: 91% 98%  100%  Weight:    223 lb 14.4 oz (101.6 kg)  Height:     (1.626 m)   No intake or output data in the 24 hours ending 01/11/18 1655 Filed Weights   01/11/18 0405 01/11/18 1614  Weight: 231 lb (104.8 kg) 223 lb 14.4 oz (101.6 kg)   Body mass index is 38.43 kg/m.  General:  Well nourished, well developed, in no acute distress HEENT: normal Lymph: no adenopathy Neck: no JVD Endocrine:  No thryomegaly Cardiac:  normal S1, S2; RRR; no significant murmurs Lungs:  clear to auscultation bilaterally, no wheezing, rhonchi or rales  Abd: soft, nontender, no hepatomegaly  Ext: no edema Musculoskeletal:  No deformities, BUE and BLE strength normal and equal Skin: warm and dry  Neuro:  CNs 2-12 intact, no focal  abnormalities noted Psych:  Normal affect    Relevant CV Studies: Echo reviewed above  Laboratory Data:  Chemistry Recent Labs  Lab 01/06/18 1006 01/11/18 0420  NA 139 139  K 3.3* 3.8  CL 108 109  CO2 22 19*  GLUCOSE 116* 106*  BUN 11 11  CREATININE 0.82 0.88  CALCIUM 8.6* 8.8*  GFRNONAA >60 >60  GFRAA >60 >60  ANIONGAP 9 11    No results for input(s): PROT, ALBUMIN, AST, ALT, ALKPHOS, BILITOT in the last 168 hours. Hematology Recent Labs  Lab 01/06/18 1006 01/11/18 0420  WBC 13.1* 14.1*  RBC 3.70* 3.80*  HGB 9.1* 9.2*  HCT 29.4* 29.8*  MCV 79.5 78.4  MCH 24.6* 24.2*  MCHC 31.0 30.9  RDW 16.6* 16.5*  PLT 361 395   Cardiac Enzymes Recent Labs  Lab 01/06/18 1006  TROPONINI <0.03   No results for input(s): TROPIPOC in the last 168 hours.  BNP Recent Labs  Lab 01/06/18 1006 01/11/18 0420  BNP 314.0* 383.0*    DDimer  Recent Labs  Lab 01/11/18 0420  DDIMER 2.36*    Radiology/Studies:  Dg Chest 2 View  Result Date: 01/11/2018 CLINICAL DATA:  Sharp pain under the left breast worse with cough. History of CHF. EXAM: CHEST - 2 VIEW COMPARISON:  01/06/2018 FINDINGS: Postoperative changes in the mediastinum. Mild cardiac enlargement with pulmonary vascular congestion. Diffuse interstitial infiltrates consistent with edema. Small bilateral pleural effusions greater on the right. Fluid in the minor fissure. No developing consolidation. Similar appearance to previous study. IMPRESSION: Cardiac enlargement with pulmonary vascular congestion, interstitial edema, and small bilateral pleural effusions. Electronically Signed   By: Burman Nieves M.D.   On: 01/11/2018 04:36   Ct Angio Chest Pe W And/or Wo Contrast  Result Date: 01/11/2018 CLINICAL DATA:  Chest pain.  Productive cough. EXAM: CT ANGIOGRAPHY CHEST WITH CONTRAST TECHNIQUE: Multidetector CT imaging of the chest was performed using the standard protocol during  bolus administration of intravenous contrast.  Multiplanar CT image reconstructions and MIPs were obtained to evaluate the vascular anatomy. CONTRAST:  ISOVUE-370 IOPAMIDOL (ISOVUE-370) INJECTION 76% COMPARISON:  Radiographs of same day. CT scan of May 23, 2016. FINDINGS: Cardiovascular: Satisfactory opacification of the pulmonary arteries to the segmental level. No evidence of pulmonary embolism. Normal heart size. No pericardial effusion. Status post mitral valve repair. Mediastinum/Nodes: Thyroid gland and esophagus are unremarkable. Stable mediastinal adenopathy is noted, including calcified right paratracheal adenopathy. This most likely represents chronic inflammation or prior granulomatous disease. Lungs/Pleura: No pneumothorax is noted. Minimal bilateral pleural effusions are noted. Diffuse opacities are noted in both lungs, most prominently seen in the lung bases concerning for edema or less likely inflammation. Upper lobe opacities are improved compared to prior exam. Upper Abdomen: No acute abnormality. Musculoskeletal: No chest wall abnormality. No acute or significant osseous findings. Review of the MIP images confirms the above findings. IMPRESSION: No definite evidence of pulmonary embolus. Stable mediastinal adenopathy is noted, with calcified right paratracheal adenopathy, most consistent with chronic inflammation or prior granulomatous disease. Diffuse opacities are noted in both lungs, most prominently seen in the lung bases, most consistent with edema or less likely inflammation. Minimal bilateral pleural effusions are noted. Electronically Signed   By: Lupita Raider, M.D.   On: 01/11/2018 10:52    Assessment and Plan:   1.  Acute on chronic diastolic heart failure: Symptoms have improved somewhat since receiving a DuoNeb and Bumex.  She has been orthopneic for some time.  I will start IV Lasix 40 mg twice daily.  She will need to be able to lie flat in order to perform a TEE adequately.  I will arrange this to be performed  in the outpatient setting next week either at [facility] or [facility] if she is able to get a ride.  She said she does not drive. She tells me she ran out of Bumex quite some time back and has been taking Lasix which does not work as well.  However the discharge summary on 09/11/2017 indicates she was discharged on Bumex 2 mg twice daily.  2.  History of mitral valve repair: As noted above, there were discrepancies in the echocardiograms with regards to the severity of mitral stenosis in September 2018 in December 2018.  Her last TEE was in September 2017. She will need to be able to lie more flat in order to perform a TEE adequately.  I will arrange this to be performed in the outpatient setting next week either at [facility] or [facility] if she is able to get a ride.  She said she does not drive.  3.  Pulmonary hypertension: This is secondary to diastolic heart failure which is likely due to post mitral valve repair mitral stenosis.   For questions or updates, please contact [facility] Please consult [email or web address] for contact info under Cardiology/STEMI.   Signed, Prentice Docker, MD  01/11/2018 4:55 PM

## 2018-01-12 DIAGNOSIS — J9601 Acute respiratory failure with hypoxia: Secondary | ICD-10-CM | POA: Diagnosis not present

## 2018-01-12 DIAGNOSIS — R0603 Acute respiratory distress: Secondary | ICD-10-CM | POA: Diagnosis not present

## 2018-01-12 DIAGNOSIS — D508 Other iron deficiency anemias: Secondary | ICD-10-CM | POA: Diagnosis not present

## 2018-01-12 DIAGNOSIS — I5033 Acute on chronic diastolic (congestive) heart failure: Secondary | ICD-10-CM | POA: Diagnosis not present

## 2018-01-12 DIAGNOSIS — J810 Acute pulmonary edema: Secondary | ICD-10-CM

## 2018-01-12 DIAGNOSIS — I10 Essential (primary) hypertension: Secondary | ICD-10-CM | POA: Diagnosis not present

## 2018-01-12 LAB — BASIC METABOLIC PANEL
Anion gap: 9 (ref 5–15)
BUN: 16 mg/dL (ref 6–20)
CALCIUM: 8.3 mg/dL — AB (ref 8.9–10.3)
CO2: 23 mmol/L (ref 22–32)
CREATININE: 1.01 mg/dL — AB (ref 0.44–1.00)
Chloride: 105 mmol/L (ref 101–111)
GFR calc Af Amer: >60 mL/min (ref >60)
GLUCOSE: 100 mg/dL — AB (ref 65–99)
Potassium: 3.2 mmol/L — ABNORMAL LOW (ref 3.5–5.1)
Sodium: 137 mmol/L (ref 135–145)

## 2018-01-12 LAB — TROPONIN I

## 2018-01-12 MED ORDER — POTASSIUM CHLORIDE CRYS ER 20 MEQ PO TBCR: 40.0000 meq | EXTENDED_RELEASE_TABLET | Freq: Every day | ORAL | 2 refills | Status: DC

## 2018-01-12 MED ORDER — POTASSIUM CHLORIDE CRYS ER 20 MEQ PO TBCR
40.0000 meq | EXTENDED_RELEASE_TABLET | Freq: Two times a day (BID) | ORAL | Status: DC
  Administered 2018-01-12: 40 meq via ORAL
  Filled 2018-01-12: qty 2

## 2018-01-12 MED ORDER — TORSEMIDE 20 MG PO TABS: ORAL_TABLET | ORAL | 1 refills | Status: DC

## 2018-01-12 NOTE — Progress Notes (Signed)
Progress Note  Patient Name: Theresa Crawford Date of Encounter: 01/12/2018  Primary Cardiologist: Prentice Docker, MD   Subjective   Occasional chest pains only with coughing. Shortness of breath is improving.  Inpatient Medications    Scheduled Meds: . citalopram  40 mg Oral QHS  . docusate sodium  100 mg Oral Daily  . enoxaparin (LOVENOX) injection  40 mg Subcutaneous Q24H  . furosemide  40 mg Intravenous BID  . metoprolol succinate  25 mg Oral Daily  . QUEtiapine  25 mg Oral QHS  . sodium chloride flush  3 mL Intravenous Q12H   Continuous Infusions: . sodium chloride     PRN Meds: sodium chloride, acetaminophen, ondansetron (ZOFRAN) IV, sodium chloride flush   Vital Signs    Vitals:   01/11/18 1614 01/11/18 2115 01/12/18 0300 01/12/18 0455  BP: 120/78 (!) 116/56  (!) 110/44  Pulse: 80 79  77  Resp: (!) 22 20    Temp: 97.6 F (36.4 C) 98.5 F (36.9 C)  98.5 F (36.9 C)  TempSrc: Oral Oral  Oral  SpO2: 100% 100%  95%  Weight: 223 lb 14.4 oz (101.6 kg)  228 lb 2.8 oz (103.5 kg)   Height:  (1.626 m)       Intake/Output Summary (Last 24 hours) at 01/12/2018 0931 Last data filed at 01/11/2018 2212 Gross per 24 hour  Intake 243 ml  Output 900 ml  Net -657 ml   Filed Weights   01/11/18 0405 01/11/18 1614 01/12/18 0300  Weight: 231 lb (104.8 kg) 223 lb 14.4 oz (101.6 kg) 228 lb 2.8 oz (103.5 kg)    Telemetry    NSR - Personally Reviewed  ECG    n/a - Personally Reviewed  Physical Exam   GEN: No acute distress.   Neck: No JVD Cardiac: RRR, no murmurs, rubs, or gallops.  Respiratory: Bibasilar rales GI: Soft, nontender, non-distended  MS: No edema; No deformity. Neuro:  Nonfocal  Psych: Normal affect   Labs    Chemistry Recent Labs  Lab 01/06/18 1006 01/11/18 0420 01/12/18 0655  NA 139 139 137  K 3.3* 3.8 3.2*  CL 108 109 105  CO2 22 19* 23  GLUCOSE 116* 106* 100*  BUN CREATININE 0.82 0.88 1.01*  CALCIUM 8.6* 8.8* 8.3*    GFRNONAA >60 >60 >60  GFRAA >60 >60 >60  ANIONGAP Hematology Recent Labs  Lab 01/06/18 1006 01/11/18 0420  WBC 13.1* 14.1*  RBC 3.70* 3.80*  HGB 9.1* 9.2*  HCT 29.4* 29.8*  MCV 79.5 78.4  MCH 24.6* 24.2*  MCHC 31.0 30.9  RDW 16.6* 16.5*  PLT 361 395    Cardiac Enzymes Recent Labs  Lab 01/06/18 1006 01/11/18 1821 01/12/18 0006 01/12/18 0655  TROPONINI <0.03 <0.03 <0.03 <0.03   No results for input(s): TROPIPOC in the last 168 hours.   BNP Recent Labs  Lab 01/06/18 1006 01/11/18 0420  BNP 314.0* 383.0*     DDimer  Recent Labs  Lab 01/11/18 0420  DDIMER 2.36*     Radiology    Dg Chest 2 View  Result Date: 01/11/2018 CLINICAL DATA:  Sharp pain under the left breast worse with cough. History of CHF. EXAM: CHEST - 2 VIEW COMPARISON:  01/06/2018 FINDINGS: Postoperative changes in the mediastinum. Mild cardiac enlargement with pulmonary vascular congestion. Diffuse interstitial infiltrates consistent with edema. Small bilateral pleural effusions greater on the right. Fluid in the minor  fissure. No developing consolidation. Similar appearance to previous study. IMPRESSION: Cardiac enlargement with pulmonary vascular congestion, interstitial edema, and small bilateral pleural effusions. Electronically Signed   By: Burman Nieves M.D.   On: 01/11/2018 04:36   Ct Angio Chest Pe W And/or Wo Contrast  Result Date: 01/11/2018 CLINICAL DATA:  Chest pain.  Productive cough. EXAM: CT ANGIOGRAPHY CHEST WITH CONTRAST TECHNIQUE: Multidetector CT imaging of the chest was performed using the standard protocol during bolus administration of intravenous contrast. Multiplanar CT image reconstructions and MIPs were obtained to evaluate the vascular anatomy. CONTRAST:  ISOVUE-370 IOPAMIDOL (ISOVUE-370) INJECTION 76% COMPARISON:  Radiographs of same day. CT scan of May 23, 2016. FINDINGS: Cardiovascular: Satisfactory opacification of the pulmonary arteries to the  segmental level. No evidence of pulmonary embolism. Normal heart size. No pericardial effusion. Status post mitral valve repair. Mediastinum/Nodes: Thyroid gland and esophagus are unremarkable. Stable mediastinal adenopathy is noted, including calcified right paratracheal adenopathy. This most likely represents chronic inflammation or prior granulomatous disease. Lungs/Pleura: No pneumothorax is noted. Minimal bilateral pleural effusions are noted. Diffuse opacities are noted in both lungs, most prominently seen in the lung bases concerning for edema or less likely inflammation. Upper lobe opacities are improved compared to prior exam. Upper Abdomen: No acute abnormality. Musculoskeletal: No chest wall abnormality. No acute or significant osseous findings. Review of the MIP images confirms the above findings. IMPRESSION: No definite evidence of pulmonary embolus. Stable mediastinal adenopathy is noted, with calcified right paratracheal adenopathy, most consistent with chronic inflammation or prior granulomatous disease. Diffuse opacities are noted in both lungs, most prominently seen in the lung bases, most consistent with edema or less likely inflammation. Minimal bilateral pleural effusions are noted. Electronically Signed   By: Lupita Raider, M.D.   On: 01/11/2018 10:52    Cardiac Studies   None this admission  Patient Profile     37 y.o. female with h/o mitral valve repair and what appears to be post mitral repair mitral stenosis, chronic diastolic heart failure, and pulmonary hypertension admitted with increasing shortness of breath, coughing, and chest pains.  Assessment & Plan    1.  Acute on chronic diastolic heart failure: Symptoms continue to improve. 657 cc output in last 24 hrs.  Continue IV Lasix 40 mg twice daily.  She will need to be able to lie more flat in order to perform a TEE adequately.  I will arrange this to be performed in the outpatient setting next week either at [facility] or [facility] if she is able to get a ride.  She said she does not drive. She tells me she ran out of Bumex quite some time back and has been taking Lasix which does not work as well.  However the discharge summary on 09/11/2017 indicates she was discharged on Bumex 2 mg twice daily. She will need Bumex at discharge with adequate number of refills.  2.  History of mitral valve repair: As noted in my consult note on 01/11/18, there were discrepancies in the echocardiograms with regards to the severity of mitral stenosis in September 2018 in December 2018.  Her last TEE was in September 2017. She will need to be able to lie more flat in order to perform a TEE adequately.  I will arrange this to be performed in the outpatient setting next week either at [facility] or [facility] if she is able to get a ride.  She said she does not drive.  3.  Pulmonary hypertension: This is secondary to diastolic heart failure which is likely due to post mitral valve repair mitral stenosis.     For questions or updates, please contact [facility] Please consult [email or web address] for contact info under Cardiology/STEMI.      Signed, Prentice Docker, MD  01/12/2018, 9:31 AM

## 2018-01-12 NOTE — Discharge Summary (Signed)
Physician Discharge Summary  Theresa Crawford:[id number] DOB: [birth year] DOA: 01/11/2018  PCP: Jerrell Belfast, [name]  Admit date: 01/11/2018 Discharge date: 01/12/2018  Admitted From: Home Disposition: Home  Recommendations for Outpatient Follow-up:  1. Follow up with PCP in 1-2 weeks 2. Please obtain BMP/CBC in one week 3. Follow-up with cardiology next week for TEE and for further management of heart failure  Discharge Condition: Stable CODE STATUS: Full code Diet recommendation: Heart Healthy   Brief/Interim Summary: 37 year old female with history of diastolic heart failure, presents to the hospital with worsening shortness of breath and orthopnea.  Found to be in decompensated CHF and started on IV Lasix.  She reports that she has been taking both Lasix and Bumex as an outpatient, but these have been ineffective.  She was started on IV Lasix with improvement of symptoms.  Her overall shortness of breath has improved.  She was followed by cardiology in the hospital.  After reviewing her previous echoes, it was determined that she would need a transesophageal echocardiogram to better evaluate her mitral valve.  This will be scheduled as an outpatient.  Day after admission, patient did feel significantly better.  She requested to be discharged since she was having personal family issues that she needed to attend to.  She felt that she would be able to manage at home in her current state.  She was able to ambulate without difficulty and felt that she was able to lay down flat.  She was not requiring any supplemental oxygen.  Case was discussed with Dr. Purvis Sheffield who recommended the patient be discharged home on torsemide 20 mg daily.  Was also advised that patient take an extra tablet if she is noted greater than 3 pound weight gain in 1 to 2 days.  Discharge Diagnoses:  Active Problems:   Essential hypertension   Acute respiratory failure with hypoxia (HCC)   Acute on chronic diastolic CHF  (congestive heart failure) (HCC)   Anemia, iron deficiency   Pulmonary hypertension (HCC)   Dyspnea    Discharge Instructions  Discharge Instructions    Diet - low sodium heart healthy   Complete by:  As directed    Increase activity slowly   Complete by:  As directed      Allergies as of 01/12/2018      Reactions   Lisinopril Shortness Of Breath, Swelling   Throat swelling   Penicillins Shortness Of Breath, Swelling   Has patient had a PCN reaction causing immediate rash, facial/tongue/throat swelling, SOB or lightheadedness with hypotension: Yes Has patient had a PCN reaction causing severe rash involving mucus membranes or skin necrosis: Yes Has patient had a PCN reaction that required hospitalization No Has patient had a PCN reaction occurring within the last 10 years: No If all of the above answers are "NO", then may proceed with Cephalosporin use.   Perflutren Lipid Microsphere Shortness Of Breath   Other reaction(s): Chest Pain/Tightness   Bee Venom    Contrast Media [iodinated Diagnostic Agents] Hives, Itching   Itching and hives to throat, neck, face and arms.   No difficulty breathing.   This was given at [facility] approximately 2015. Contrast Dye   Shellfish Allergy       Medication List    STOP taking these medications   bumetanide 2 MG tablet Commonly known as:  BUMEX   furosemide 40 MG tablet Commonly known as:  LASIX     TAKE these medications   albuterol 108 (  90 Base) MCG/ACT inhaler Commonly known as:  PROVENTIL HFA;VENTOLIN HFA Inhale 1-2 puffs into the lungs every 6 (six) hours as needed for wheezing or shortness of breath.   citalopram 40 MG tablet Commonly known as:  CELEXA Take 40 mg by mouth at bedtime.   diazepam 5 MG tablet Commonly known as:  VALIUM Take 1 tablet (5 mg total) by mouth every 12 (twelve) hours as needed for muscle spasms (spasms).   docusate sodium 100 MG capsule Commonly known as:  COLACE Take 100 mg  by mouth daily.   EPIPEN 2-PAK 0.3 mg/0.3 mL Soaj injection Generic drug:  EPINEPHrine Inject 0.3 Units into the muscle once as needed.   ferrous sulfate 325 (65 FE) MG tablet Take 325 mg by mouth 2 (two) times daily with a meal.   metoprolol succinate 25 MG 24 hr tablet Commonly known as:  TOPROL-XL Take 1 tablet (25 mg total) by mouth daily.   oxyCODONE-acetaminophen 5-325 MG tablet Commonly known as:  PERCOCET Take 1 tablet by mouth every 4 (four) hours as needed.   potassium chloride SA 20 MEQ tablet Commonly known as:  K-DUR,KLOR-CON Take 2 tablets (40 mEq total) by mouth daily.   PROBIOTIC DAILY PO Take 1 capsule by mouth daily.   QUEtiapine 25 MG tablet Commonly known as:  SEROQUEL Take 25 mg by mouth at bedtime.   torsemide 20 MG tablet Commonly known as:  DEMADEX Take 1 tab po daily, take 1 extra tablet daily for weight gain of more than 3 lbs in a day   traMADol 50 MG tablet Commonly known as:  ULTRAM Take 1 tablet (50 mg total) by mouth every 6 (six) hours as needed.      Follow-up Information    [name], MD Follow up on 01/23/2018.   Specialty:  Cardiology Why:  Your Transesophageal Echocardiogram is scheduled on 01/23/2018 at 10:30AM. You need to arrive to Short Stay at [facility] by 9:00AM. Nothing to eat or drink after midnight leading up to your procedure.  Contact information: [address] [place] [address] [phone]          Allergies  Allergen Reactions  . Lisinopril Shortness Of Breath and Swelling    Throat swelling  . Penicillins Shortness Of Breath and Swelling    Has patient had a PCN reaction causing immediate rash, facial/tongue/throat swelling, SOB or lightheadedness with hypotension: Yes Has patient had a PCN reaction causing severe rash involving mucus membranes or skin necrosis: Yes Has patient had a PCN reaction that required hospitalization No Has patient had a PCN reaction occurring within the last 10 years:  No If all of the above answers are "NO", then may proceed with Cephalosporin use.   Marland Kitchen Perflutren Lipid Microsphere Shortness Of Breath    Other reaction(s): Chest Pain/Tightness  . Bee Venom   . Contrast Media [Iodinated Diagnostic Agents] Hives and Itching    Itching and hives to throat, neck, face and arms.   No difficulty breathing.   This was given at [facility] approximately 2015. Contrast Dye  . Shellfish Allergy     Consultations:  Cardiology   Procedures/Studies: Dg Chest 2 View  Result Date: 01/11/2018 CLINICAL DATA:  Sharp pain under the left breast worse with cough. History of CHF. EXAM: CHEST - 2 VIEW COMPARISON:  01/06/2018 FINDINGS: Postoperative changes in the mediastinum. Mild cardiac enlargement with pulmonary vascular congestion. Diffuse interstitial infiltrates consistent with edema. Small bilateral pleural effusions greater on the  right. Fluid in the minor fissure. No developing consolidation. Similar appearance to previous study. IMPRESSION: Cardiac enlargement with pulmonary vascular congestion, interstitial edema, and small bilateral pleural effusions. Electronically Signed   By: Burman Nieves M.D.   On: 01/11/2018 04:36   Dg Chest 2 View  Result Date: 01/06/2018 CLINICAL DATA:  Shortness of breath and cough EXAM: CHEST - 2 VIEW COMPARISON:  September 10, 2017 FINDINGS: There is a small right pleural effusion. There is fibrotic type change throughout the mid and lower lung zones bilaterally. There is suspected degree of interstitial edema present. There is no well-defined airspace consolidation. Heart is mildly enlarged with pulmonary venous hypertension. Patient is status post mitral valve replacement. No adenopathy. There are rudimentary cervical ribs bilaterally. IMPRESSION: Areas of fibrotic change and scarring in the mid and lower lung zones. Small right pleural effusion. There is pulmonary vascular congestion with suspected mild interstitial edema  superimposed. No consolidation. Patient is status post mitral valve replacement. Electronically Signed   By: Bretta Bang III M.D.   On: 01/06/2018 09:11   Ct Angio Chest Pe W And/or Wo Contrast  Result Date: 01/11/2018 CLINICAL DATA:  Chest pain.  Productive cough. EXAM: CT ANGIOGRAPHY CHEST WITH CONTRAST TECHNIQUE: Multidetector CT imaging of the chest was performed using the standard protocol during bolus administration of intravenous contrast. Multiplanar CT image reconstructions and MIPs were obtained to evaluate the vascular anatomy. CONTRAST:  ISOVUE-370 IOPAMIDOL (ISOVUE-370) INJECTION 76% COMPARISON:  Radiographs of same day. CT scan of May 23, 2016. FINDINGS: Cardiovascular: Satisfactory opacification of the pulmonary arteries to the segmental level. No evidence of pulmonary embolism. Normal heart size. No pericardial effusion. Status post mitral valve repair. Mediastinum/Nodes: Thyroid gland and esophagus are unremarkable. Stable mediastinal adenopathy is noted, including calcified right paratracheal adenopathy. This most likely represents chronic inflammation or prior granulomatous disease. Lungs/Pleura: No pneumothorax is noted. Minimal bilateral pleural effusions are noted. Diffuse opacities are noted in both lungs, most prominently seen in the lung bases concerning for edema or less likely inflammation. Upper lobe opacities are improved compared to prior exam. Upper Abdomen: No acute abnormality. Musculoskeletal: No chest wall abnormality. No acute or significant osseous findings. Review of the MIP images confirms the above findings. IMPRESSION: No definite evidence of pulmonary embolus. Stable mediastinal adenopathy is noted, with calcified right paratracheal adenopathy, most consistent with chronic inflammation or prior granulomatous disease. Diffuse opacities are noted in both lungs, most prominently seen in the lung bases, most consistent with edema or less likely inflammation.  Minimal bilateral pleural effusions are noted. Electronically Signed   By: Lupita Raider, M.D.   On: 01/11/2018 10:52       Subjective: Overall feels that her breathing has improved.  Able to ambulate without shortness of breath.  Able to lie more flat.  No chest pain.  Discharge Exam: Vitals:   01/11/18 2115 01/12/18 0455  BP: (!) 116/56 (!) 110/44  Pulse: 79 77  Resp: 20   Temp: 98.5 F (36.9 C) 98.5 F (36.9 C)  SpO2: 100% 95%   Vitals:   01/11/18 1614 01/11/18 2115 01/12/18 0300 01/12/18 0455  BP: 120/78 (!) 116/56  (!) 110/44  Pulse: 80 79  77  Resp: (!) 22 20    Temp: 97.6 F (36.4 C) 98.5 F (36.9 C)  98.5 F (36.9 C)  TempSrc: Oral Oral  Oral  SpO2: 100% 100%  95%  Weight: 101.6 kg (223 lb 14.4 oz)  103.5 kg (228 lb 2.8 oz)  Height:  (1.626 m)       General: Pt is alert, awake, not in acute distress Cardiovascular: RRR, S1/S2 +, no rubs, no gallops Respiratory: Mild crackles at bases Abdominal: Soft, NT, ND, bowel sounds + Extremities: no edema, no cyanosis    The results of significant diagnostics from this hospitalization (including imaging, microbiology, ancillary and laboratory) are listed below for reference.     Microbiology: No results found for this or any previous visit (from the past 240 hour(s)).   Labs: BNP (last 3 results) Recent Labs    09/10/17 0145 01/06/18 1006 01/11/18 0420  BNP 384.0* 314.0* 383.0*   Basic Metabolic Panel: Recent Labs  Lab 01/06/18 1006 01/11/18 0420 01/12/18 0655  NA 139 139 137  K 3.3* 3.8 3.2*  CL 108 109 105  CO2 22 19* 23  GLUCOSE 116* 106* 100*  BUN CREATININE 0.82 0.88 1.01*  CALCIUM 8.6* 8.8* 8.3*   Liver Function Tests: No results for input(s): AST, ALT, ALKPHOS, BILITOT, PROT, ALBUMIN in the last 168 hours. No results for input(s): LIPASE, AMYLASE in the last 168 hours. No results for input(s): AMMONIA in the last 168 hours. CBC: Recent Labs  Lab 01/06/18 1006  01/11/18 0420  WBC 13.1* 14.1*  NEUTROABS 11.2* 12.5*  HGB 9.1* 9.2*  HCT 29.4* 29.8*  MCV 79.5 78.4  PLT 361 395   Cardiac Enzymes: Recent Labs  Lab 01/06/18 1006 01/11/18 1821 01/12/18 0006 01/12/18 0655  TROPONINI <0.03 <0.03 <0.03 <0.03   BNP: Invalid input(s): POCBNP CBG: No results for input(s): GLUCAP in the last 168 hours. D-Dimer Recent Labs    01/11/18 0420  DDIMER 2.36*   Hgb A1c No results for input(s): HGBA1C in the last 72 hours. Lipid Profile No results for input(s): CHOL, HDL, LDLCALC, TRIG, CHOLHDL, LDLDIRECT in the last 72 hours. Thyroid function studies No results for input(s): TSH, T4TOTAL, T3FREE, THYROIDAB in the last 72 hours.  Invalid input(s): FREET3 Anemia work up No results for input(s): VITAMINB12, FOLATE, FERRITIN, TIBC, IRON, RETICCTPCT in the last 72 hours. Urinalysis    Component Value Date/Time   COLORURINE YELLOW 05/23/2016 2027   APPEARANCEUR CLEAR 05/23/2016 2027   LABSPEC <1.005 (L) 05/23/2016 2027   PHURINE 6.5 05/23/2016 2027   GLUCOSEU NEGATIVE 05/23/2016 2027   HGBUR NEGATIVE 05/23/2016 2027   BILIRUBINUR NEGATIVE 05/23/2016 2027   KETONESUR NEGATIVE 05/23/2016 2027   PROTEINUR NEGATIVE 05/23/2016 2027   NITRITE NEGATIVE 05/23/2016 2027   LEUKOCYTESUR TRACE (A) 05/23/2016 2027   Sepsis Labs Invalid input(s): PROCALCITONIN,  WBC,  LACTICIDVEN Microbiology No results found for this or any previous visit (from the past 240 hour(s)).   Time coordinating discharge:  SIGNED:   Erick Blinks, MD  [facility] 01/12/2018, 7:36 PM Pager   If 7PM-7AM, please contact night-coverage [email or web address] Password [email or web address]

## 2018-01-19 ENCOUNTER — Encounter (HOSPITAL_COMMUNITY)
Admission: RE | Admit: 2018-01-19 | Discharge: 2018-01-19 | Disposition: A | Discharge status: Home / Self Care | Payer: Medicaid Other | Source: Ambulatory Visit | Attending: Cardiology | Admitting: Cardiology

## 2018-01-19 ENCOUNTER — Encounter (HOSPITAL_COMMUNITY): Payer: Self-pay

## 2018-01-19 NOTE — Patient Instructions (Signed)
Theresa Crawford  01/19/2018     @   Your procedure is scheduled on  01/25/2018  Report to [facility] at  830   A.M.  Call this number if you have problems the morning of surgery:  [phone]   Remember:  Do not eat food or drink liquids after midnight.  Take these medicines the morning of surgery with A SIP OF WATER  Metoprolol. Use your inhaler before you come.   Do not wear jewelry, make-up or nail polish.  Do not wear lotions, powders, or perfumes, or deodorant.  Do not shave 48 hours prior to surgery.  Men may shave face and neck.  Do not bring valuables to the hospital.  [facility] is not responsible for any belongings or valuables.  Contacts, dentures or bridgework may not be worn into surgery.  Leave your suitcase in the car.  After surgery it may be brought to your room.  For patients admitted to the hospital, discharge time will be determined by your treatment team.  Patients discharged the day of surgery will not be allowed to drive home.   Name and phone number of your driver:  family Special instructions:  None  Please read over the following fact sheets that you were given. Anesthesia Post-op Instructions and Care and Recovery After Surgery       Transesophageal Echocardiogram Transesophageal echocardiography (TEE) is a picture test of your heart using sound waves. The pictures taken can give very detailed pictures of your heart. This can help your doctor see if there are problems with your heart. TEE can check:  If your heart has blood clots in it.  How well your heart valves are working.  If you have an infection on the inside of your heart.  Some of the major arteries of your heart.  If your heart valve is working after a Psychologist, forensic.  Your heart before a procedure that uses a shock to your heart to get the rhythm back to normal.  What happens before the procedure?  Do not eat or drink for 6 hours before the procedure  or as told by your doctor.  Make plans to have someone drive you home after the procedure. Do not drive yourself home.  An IV tube will be put in your arm. What happens during the procedure?  You will be given a medicine to help you relax (sedative). It will be given through the IV tube.  A numbing medicine will be sprayed or gargled in the back of your throat to help numb it.  The tip of the probe is placed into the back of your mouth. You will be asked to swallow. This helps to pass the probe into your esophagus.  Once the tip of the probe is in the right place, your doctor can take pictures of your heart.  You may feel pressure at the back of your throat. What happens after the procedure?  You will be taken to a recovery area so the sedative can wear off.  Your throat may be sore and scratchy. This will go away slowly over time.  You will go home when you are fully awake and able to swallow liquids.  You should have someone stay with you for the next 24 hours.  Do not drive or operate machinery for the next 24 hours. This information is not intended to replace advice given to you by your health care provider. Make sure  you discuss any questions you have with your health care provider. Document Released: 06/27/2009 Document Revised: 02/05/2016 Document Reviewed: 03/01/2013 Elsevier Interactive Patient Education  2018 [employer] Inc.  Transesophageal Echocardiogram Transesophageal echocardiography (TEE) is a picture test of your heart using sound waves. The pictures taken can give very detailed pictures of your heart. This can help your doctor see if there are problems with your heart. TEE can check:  If your heart has blood clots in it.  How well your heart valves are working.  If you have an infection on the inside of your heart.  Some of the major arteries of your heart.  If your heart valve is working after a Psychologist, forensic.  Your heart before a procedure that uses a shock to  your heart to get the rhythm back to normal.  What happens before the procedure?  Do not eat or drink for 6 hours before the procedure or as told by your doctor.  Make plans to have someone drive you home after the procedure. Do not drive yourself home.  An IV tube will be put in your arm. What happens during the procedure?  You will be given a medicine to help you relax (sedative). It will be given through the IV tube.  A numbing medicine will be sprayed or gargled in the back of your throat to help numb it.  The tip of the probe is placed into the back of your mouth. You will be asked to swallow. This helps to pass the probe into your esophagus.  Once the tip of the probe is in the right place, your doctor can take pictures of your heart.  You may feel pressure at the back of your throat. What happens after the procedure?  You will be taken to a recovery area so the sedative can wear off.  Your throat may be sore and scratchy. This will go away slowly over time.  You will go home when you are fully awake and able to swallow liquids.  You should have someone stay with you for the next 24 hours.  Do not drive or operate machinery for the next 24 hours. This information is not intended to replace advice given to you by your health care provider. Make sure you discuss any questions you have with your health care provider. Document Released: 06/27/2009 Document Revised: 02/05/2016 Document Reviewed: 03/01/2013 Elsevier Interactive Patient Education  2018 [employer] Inc.  Monitored Anesthesia Care Anesthesia is a term that refers to techniques, procedures, and medicines that help a person stay safe and comfortable during a medical procedure. Monitored anesthesia care, or sedation, is one type of anesthesia. Your anesthesia specialist may recommend sedation if you will be having a procedure that does not require you to be unconscious, such as:  Cataract surgery.  A dental  procedure.  A biopsy.  A colonoscopy.  During the procedure, you may receive a medicine to help you relax (sedative). There are three levels of sedation:  Mild sedation. At this level, you may feel awake and relaxed. You will be able to follow directions.  Moderate sedation. At this level, you will be sleepy. You may not remember the procedure.  Deep sedation. At this level, you will be asleep. You will not remember the procedure.  The more medicine you are given, the deeper your level of sedation will be. Depending on how you respond to the procedure, the anesthesia specialist may change your level of sedation or the type of anesthesia to fit  your needs. An anesthesia specialist will monitor you closely during the procedure. Let your health care provider know about:  Any allergies you have.  All medicines you are taking, including vitamins, herbs, eye drops, creams, and over-the-counter medicines.  Any use of steroids (by mouth or as a cream).  Any problems you or family members have had with sedatives and anesthetic medicines.  Any blood disorders you have.  Any surgeries you have had.  Any medical conditions you have, such as sleep apnea.  Whether you are pregnant or may be pregnant.  Any use of cigarettes, alcohol, or street drugs. What are the risks? Generally, this is a safe procedure. However, problems may occur, including:  Getting too much medicine (oversedation).  Nausea.  Allergic reaction to medicines.  Trouble breathing. If this happens, a breathing tube may be used to help with breathing. It will be removed when you are awake and breathing on your own.  Heart trouble.  Lung trouble.  Before the procedure Staying hydrated Follow instructions from your health care provider about hydration, which may include:  Up to 2 hours before the procedure - you may continue to drink clear liquids, such as water, clear fruit juice, black coffee, and plain  tea.  Eating and drinking restrictions Follow instructions from your health care provider about eating and drinking, which may include:  8 hours before the procedure - stop eating heavy meals or foods such as meat, fried foods, or fatty foods.  6 hours before the procedure - stop eating light meals or foods, such as toast or cereal.  6 hours before the procedure - stop drinking milk or drinks that contain milk.  2 hours before the procedure - stop drinking clear liquids.  Medicines Ask your health care provider about:  Changing or stopping your regular medicines. This is especially important if you are taking diabetes medicines or blood thinners.  Taking medicines such as aspirin and ibuprofen. These medicines can thin your blood. Do not take these medicines before your procedure if your health care provider instructs you not to.  Tests and exams  You will have a physical exam.  You may have blood tests done to show: ? How well your kidneys and liver are working. ? How well your blood can clot.  General instructions  Plan to have someone take you home from the hospital or clinic.  If you will be going home right after the procedure, plan to have someone with you for 24 hours.  What happens during the procedure?  Your blood pressure, heart rate, breathing, level of pain and overall condition will be monitored.  An IV tube will be inserted into one of your veins.  Your anesthesia specialist will give you medicines as needed to keep you comfortable during the procedure. This may mean changing the level of sedation.  The procedure will be performed. After the procedure  Your blood pressure, heart rate, breathing rate, and blood oxygen level will be monitored until the medicines you were given have worn off.  Do not drive for 24 hours if you received a sedative.  You may: ? Feel sleepy, clumsy, or nauseous. ? Feel forgetful about what happened after the  procedure. ? Have a sore throat if you had a breathing tube during the procedure. ? Vomit. This information is not intended to replace advice given to you by your health care provider. Make sure you discuss any questions you have with your health care provider. Document Released: 05/26/2005 Document  Revised: 02/06/2016 Document Reviewed: 12/21/2015 Elsevier Interactive Patient Education  2018 [employer]. Monitored Anesthesia Care, Care After These instructions provide you with information about caring for yourself after your procedure. Your health care provider may also give you more specific instructions. Your treatment has been planned according to current medical practices, but problems sometimes occur. Call your health care provider if you have any problems or questions after your procedure. What can I expect after the procedure? After your procedure, it is common to:  Feel sleepy for several hours.  Feel clumsy and have poor balance for several hours.  Feel forgetful about what happened after the procedure.  Have poor judgment for several hours.  Feel nauseous or vomit.  Have a sore throat if you had a breathing tube during the procedure.  Follow these instructions at home: For at least 24 hours after the procedure:   Do not: ? Participate in activities in which you could fall or become injured. ? Drive. ? Use heavy machinery. ? Drink alcohol. ? Take sleeping pills or medicines that cause drowsiness. ? Make important decisions or sign legal documents. ? Take care of children on your own.  Rest. Eating and drinking  Follow the diet that is recommended by your health care provider.  If you vomit, drink water, juice, or soup when you can drink without vomiting.  Make sure you have little or no nausea before eating solid foods. General instructions  Have a responsible adult stay with you until you are awake and alert.  Take over-the-counter and prescription  medicines only as told by your health care provider.  If you smoke, do not smoke without supervision.  Keep all follow-up visits as told by your health care provider. This is important. Contact a health care provider if:  You keep feeling nauseous or you keep vomiting.  You feel light-headed.  You develop a rash.  You have a fever. Get help right away if:  You have trouble breathing. This information is not intended to replace advice given to you by your health care provider. Make sure you discuss any questions you have with your health care provider. Document Released: 12/21/2015 Document Revised: 04/21/2016 Document Reviewed: 12/21/2015 Elsevier Interactive Patient Education  [employer].

## 2018-01-23 ENCOUNTER — Other Ambulatory Visit (HOSPITAL_COMMUNITY): Payer: Medicaid Other

## 2018-01-25 ENCOUNTER — Ambulatory Visit (HOSPITAL_COMMUNITY): Payer: Medicaid Other | Admitting: Anesthesiology

## 2018-01-25 ENCOUNTER — Ambulatory Visit (HOSPITAL_COMMUNITY)
Admission: RE | Admit: 2018-01-25 | Discharge: 2018-01-25 | Disposition: A | Discharge status: Home / Self Care | Payer: Medicaid Other | Source: Ambulatory Visit | Attending: Cardiology | Admitting: Cardiology

## 2018-01-25 ENCOUNTER — Encounter (HOSPITAL_COMMUNITY): Payer: Self-pay | Admitting: Emergency Medicine

## 2018-01-25 ENCOUNTER — Other Ambulatory Visit: Payer: Self-pay

## 2018-01-25 ENCOUNTER — Ambulatory Visit (HOSPITAL_BASED_OUTPATIENT_CLINIC_OR_DEPARTMENT_OTHER): Payer: Medicaid Other

## 2018-01-25 ENCOUNTER — Encounter (HOSPITAL_COMMUNITY)
Admission: RE | Disposition: A | Discharge status: Home / Self Care | Payer: Self-pay | Source: Ambulatory Visit | Attending: Cardiology

## 2018-01-25 DIAGNOSIS — I5033 Acute on chronic diastolic (congestive) heart failure: Secondary | ICD-10-CM | POA: Insufficient documentation

## 2018-01-25 DIAGNOSIS — I071 Rheumatic tricuspid insufficiency: Secondary | ICD-10-CM | POA: Diagnosis not present

## 2018-01-25 DIAGNOSIS — F17210 Nicotine dependence, cigarettes, uncomplicated: Secondary | ICD-10-CM | POA: Diagnosis not present

## 2018-01-25 DIAGNOSIS — I272 Pulmonary hypertension, unspecified: Secondary | ICD-10-CM | POA: Insufficient documentation

## 2018-01-25 DIAGNOSIS — I050 Rheumatic mitral stenosis: Secondary | ICD-10-CM | POA: Insufficient documentation

## 2018-01-25 DIAGNOSIS — I110 Hypertensive heart disease with heart failure: Secondary | ICD-10-CM | POA: Insufficient documentation

## 2018-01-25 DIAGNOSIS — I352 Nonrheumatic aortic (valve) stenosis with insufficiency: Secondary | ICD-10-CM | POA: Diagnosis not present

## 2018-01-25 DIAGNOSIS — I052 Rheumatic mitral stenosis with insufficiency: Secondary | ICD-10-CM

## 2018-01-25 HISTORY — PX: TEE WITHOUT CARDIOVERSION: SHX5443

## 2018-01-25 SURGERY — ECHOCARDIOGRAM, TRANSESOPHAGEAL
Anesthesia: Monitor Anesthesia Care

## 2018-01-25 MED ORDER — LIDOCAINE VISCOUS HCL 2 % MT SOLN
15.0000 mL | Freq: Once | OROMUCOSAL | Status: AC
  Administered 2018-01-25: 15 mL via OROMUCOSAL

## 2018-01-25 MED ORDER — PROPOFOL 500 MG/50ML IV EMUL
INTRAVENOUS | Status: DC | PRN
Start: 2018-01-25 — End: 2018-01-25
  Administered 2018-01-25 (×2): via INTRAVENOUS
  Administered 2018-01-25: 150 ug/kg/min via INTRAVENOUS

## 2018-01-25 MED ORDER — MIDAZOLAM HCL 5 MG/5ML IJ SOLN
INTRAMUSCULAR | Status: DC | PRN
  Administered 2018-01-25: 2 mg via INTRAVENOUS

## 2018-01-25 MED ORDER — MIDAZOLAM HCL 2 MG/2ML IJ SOLN
INTRAMUSCULAR | Status: AC
  Filled 2018-01-25: qty 2

## 2018-01-25 MED ORDER — LIDOCAINE VISCOUS HCL 2 % MT SOLN
OROMUCOSAL | Status: AC
  Filled 2018-01-25: qty 15

## 2018-01-25 MED ORDER — PROPOFOL 10 MG/ML IV BOLUS
INTRAVENOUS | Status: AC
  Filled 2018-01-25: qty 20

## 2018-01-25 MED ORDER — LACTATED RINGERS IV SOLN
INTRAVENOUS | Status: DC
  Administered 2018-01-25: 10:00:00 via INTRAVENOUS

## 2018-01-25 MED ORDER — SODIUM CHLORIDE BACTERIOSTATIC 0.9 % IJ SOLN
INTRAMUSCULAR | Status: DC | PRN
  Administered 2018-01-25: 10 mL

## 2018-01-25 MED ORDER — PROPOFOL 10 MG/ML IV BOLUS
INTRAVENOUS | Status: DC | PRN
  Administered 2018-01-25: 10 mg via INTRAVENOUS
  Administered 2018-01-25 (×2): 20 mg via INTRAVENOUS

## 2018-01-25 MED ORDER — PHENYLEPHRINE HCL 10 MG/ML IJ SOLN
INTRAMUSCULAR | Status: DC | PRN
  Administered 2018-01-25 (×2): 40 ug via INTRAVENOUS

## 2018-01-25 NOTE — Anesthesia Postprocedure Evaluation (Signed)
Anesthesia Post Note  Patient: Theresa Crawford  Procedure(s) Performed: TRANSESOPHAGEAL ECHOCARDIOGRAM (TEE) WITH PROPOFOL (N/A )  Patient location during evaluation: PACU Anesthesia Type: MAC Level of consciousness: awake and alert and oriented Pain management: pain level controlled Vital Signs Assessment: post-procedure vital signs reviewed and stable Respiratory status: spontaneous breathing Cardiovascular status: blood pressure returned to baseline and stable Postop Assessment: no apparent nausea or vomiting Anesthetic complications: no     Last Vitals:  Vitals:   01/25/18 1145 01/25/18 1200  BP: (!) 104/34 99/62  Pulse: 71 71  Resp: (!) 32 (!) 22  Temp:    SpO2: 93% 97%    Last Pain:  Vitals:   01/25/18 1200  TempSrc:   PainSc: 0-No pain                 Exie Chrismer

## 2018-01-25 NOTE — Interval H&P Note (Signed)
History and Physical Interval Note:  Patient presents for elective transesophageal echocardiogram as ordered by Dr. Purvis Sheffield for assessment of previous mitral valve repair in the setting of diastolic heart failure and recent hospitalization.  Lab work reviewed.  Patient is prepared to proceed.  01/25/2018 8:51 AM

## 2018-01-25 NOTE — Progress Notes (Signed)
*  PRELIMINARY RESULTS* Echocardiogram Echocardiogram Transesophageal has been performed.  Stacey Drain 01/25/2018, 11:55 AM

## 2018-01-25 NOTE — Transfer of Care (Signed)
Immediate Anesthesia Transfer of Care Note  Patient: Theresa Crawford  Procedure(s) Performed: TRANSESOPHAGEAL ECHOCARDIOGRAM (TEE) WITH PROPOFOL (N/A )  Patient Location: PACU  Anesthesia Type:MAC  Level of Consciousness: awake and alert   Airway & Oxygen Therapy: Patient Spontanous Breathing and Patient connected to nasal cannula oxygen  Post-op Assessment: Report given to RN  Post vital signs: Reviewed and stable  Last Vitals:  Vitals Value Taken Time  BP 158/101 01/25/2018 11:32 AM  Temp    Pulse 68 01/25/2018 11:35 AM  Resp 19 01/25/2018 11:35 AM  SpO2 95 % 01/25/2018 11:35 AM  Vitals shown include unvalidated device data.  Last Pain:  Vitals:   01/25/18 1051  TempSrc:   PainSc: 0-No pain         Complications: No apparent anesthesia complications

## 2018-01-25 NOTE — Anesthesia Preprocedure Evaluation (Signed)
Anesthesia Evaluation  Patient identified by MRN, date of birth, ID band Patient awake    Reviewed: Allergy & Precautions, H&P , NPO status , Patient's Chart, lab work & pertinent test results  Airway Mallampati: I  TM Distance: >3 FB Neck ROM: full    Dental no notable dental hx.    Pulmonary neg pulmonary ROS, shortness of breath, pneumonia, Current Smoker,    Pulmonary exam normal breath sounds clear to auscultation       Cardiovascular Exercise Tolerance: Good hypertension, +CHF  negative cardio ROS   Rhythm:regular Rate:Normal     Neuro/Psych Anxiety Depression negative neurological ROS  negative psych ROS   GI/Hepatic negative GI ROS, Neg liver ROS,   Endo/Other  negative endocrine ROS  Renal/GU negative Renal ROS  negative genitourinary   Musculoskeletal   Abdominal   Peds  Hematology negative hematology ROS (+) anemia ,   Anesthesia Other Findings Pulmonary hypertension (HCC) Mitral valve stenosis  Reproductive/Obstetrics negative OB ROS                             Anesthesia Physical Anesthesia Plan  ASA: IV  Anesthesia Plan: MAC   Post-op Pain Management:    Induction:   PONV Risk Score and Plan:   Airway Management Planned:   Additional Equipment:   Intra-op Plan:   Post-operative Plan:   Informed Consent: I have reviewed the patients History and Physical, chart, labs and discussed the procedure including the risks, benefits and alternatives for the proposed anesthesia with the patient or authorized representative who has indicated his/her understanding and acceptance.   Dental Advisory Given  Plan Discussed with: CRNA  Anesthesia Plan Comments:         Anesthesia Quick Evaluation

## 2018-01-25 NOTE — Discharge Instructions (Signed)
Transesophageal Echocardiogram °Transesophageal echocardiography (TEE) is a picture test of your heart using sound waves. The pictures taken can give very detailed pictures of your heart. This can help your doctor see if there are problems with your heart. TEE can check: °· If your heart has blood clots in it. °· How well your heart valves are working. °· If you have an infection on the inside of your heart. °· Some of the major arteries of your heart. °· If your heart valve is working after a repair. °· Your heart before a procedure that uses a shock to your heart to get the rhythm back to normal. ° °What happens before the procedure? °· Do not eat or drink for 6 hours before the procedure or as told by your doctor. °· Make plans to have someone drive you home after the procedure. Do not drive yourself home. °· An IV tube will be put in your arm. °What happens during the procedure? °· You will be given a medicine to help you relax (sedative). It will be given through the IV tube. °· A numbing medicine will be sprayed or gargled in the back of your throat to help numb it. °· The tip of the probe is placed into the back of your mouth. You will be asked to swallow. This helps to pass the probe into your esophagus. °· Once the tip of the probe is in the right place, your doctor can take pictures of your heart. °· You may feel pressure at the back of your throat. °What happens after the procedure? °· You will be taken to a recovery area so the sedative can wear off. °· Your throat may be sore and scratchy. This will go away slowly over time. °· You will go home when you are fully awake and able to swallow liquids. °· You should have someone stay with you for the next 24 hours. °· Do not drive or operate machinery for the next 24 hours. °This information is not intended to replace advice given to you by your health care provider. Make sure you discuss any questions you have with your health care provider. °Document  Released: 06/27/2009 Document Revised: 02/05/2016 Document Reviewed: 03/01/2013 °Elsevier Interactive Patient Education © 2018 Elsevier Inc. ° ° ° °Moderate Conscious Sedation, Adult, Care After °These instructions provide you with information about caring for yourself after your procedure. Your health care provider may also give you more specific instructions. Your treatment has been planned according to current medical practices, but problems sometimes occur. Call your health care provider if you have any problems or questions after your procedure. °What can I expect after the procedure? °After your procedure, it is common: °· To feel sleepy for several hours. °· To feel clumsy and have poor balance for several hours. °· To have poor judgment for several hours. °· To vomit if you eat too soon. ° °Follow these instructions at home: °For at least 24 hours after the procedure: ° °· Do not: °? Participate in activities where you could fall or become injured. °? Drive. °? Use heavy machinery. °? Drink alcohol. °? Take sleeping pills or medicines that cause drowsiness. °? Make important decisions or sign legal documents. °? Take care of children on your own. °· Rest. °Eating and drinking °· Follow the diet recommended by your health care provider. °· If you vomit: °? Drink water, juice, or soup when you can drink without vomiting. °? Make sure you have little or no nausea before   eating solid foods. °General instructions °· Have a responsible adult stay with you until you are awake and alert. °· Take over-the-counter and prescription medicines only as told by your health care provider. °· If you smoke, do not smoke without supervision. °· Keep all follow-up visits as told by your health care provider. This is important. °Contact a health care provider if: °· You keep feeling nauseous or you keep vomiting. °· You feel light-headed. °· You develop a rash. °· You have a fever. °Get help right away if: °· You have trouble  breathing. °This information is not intended to replace advice given to you by your health care provider. Make sure you discuss any questions you have with your health care provider. °Document Released: 06/20/2013 Document Revised: 02/02/2016 Document Reviewed: 12/20/2015 °Elsevier Interactive Patient Education © 2018 Elsevier Inc. ° ° °

## 2018-01-26 ENCOUNTER — Encounter (HOSPITAL_COMMUNITY): Payer: Self-pay | Admitting: Cardiology

## 2018-01-26 ENCOUNTER — Telehealth: Payer: Self-pay

## 2018-01-26 DIAGNOSIS — I342 Nonrheumatic mitral (valve) stenosis: Secondary | ICD-10-CM

## 2018-01-26 NOTE — Telephone Encounter (Signed)
-----   Message from [name], MD sent at 01/26/2018  9:44 AM EDT ----- Please refer to CV surgery. Postpone her appt with me until afterwards.

## 2018-01-26 NOTE — Telephone Encounter (Signed)
Pt aware of ref to [facility] and apt in office cancelled, mailed results to pt

## 2018-01-26 NOTE — Telephone Encounter (Signed)
Referral placed to [facility], lm for pt to call back-cc

## 2018-02-01 IMAGING — DX DG RIBS W/ CHEST 3+V*L*
3 series · 3 of 3 positions shown · non-contrast
Comparison: None.

CLINICAL DATA: Acute left lower rib pain

EXAM:
LEFT RIBS AND CHEST - 3+ VIEW

[rib pa obl (1 of 2)]
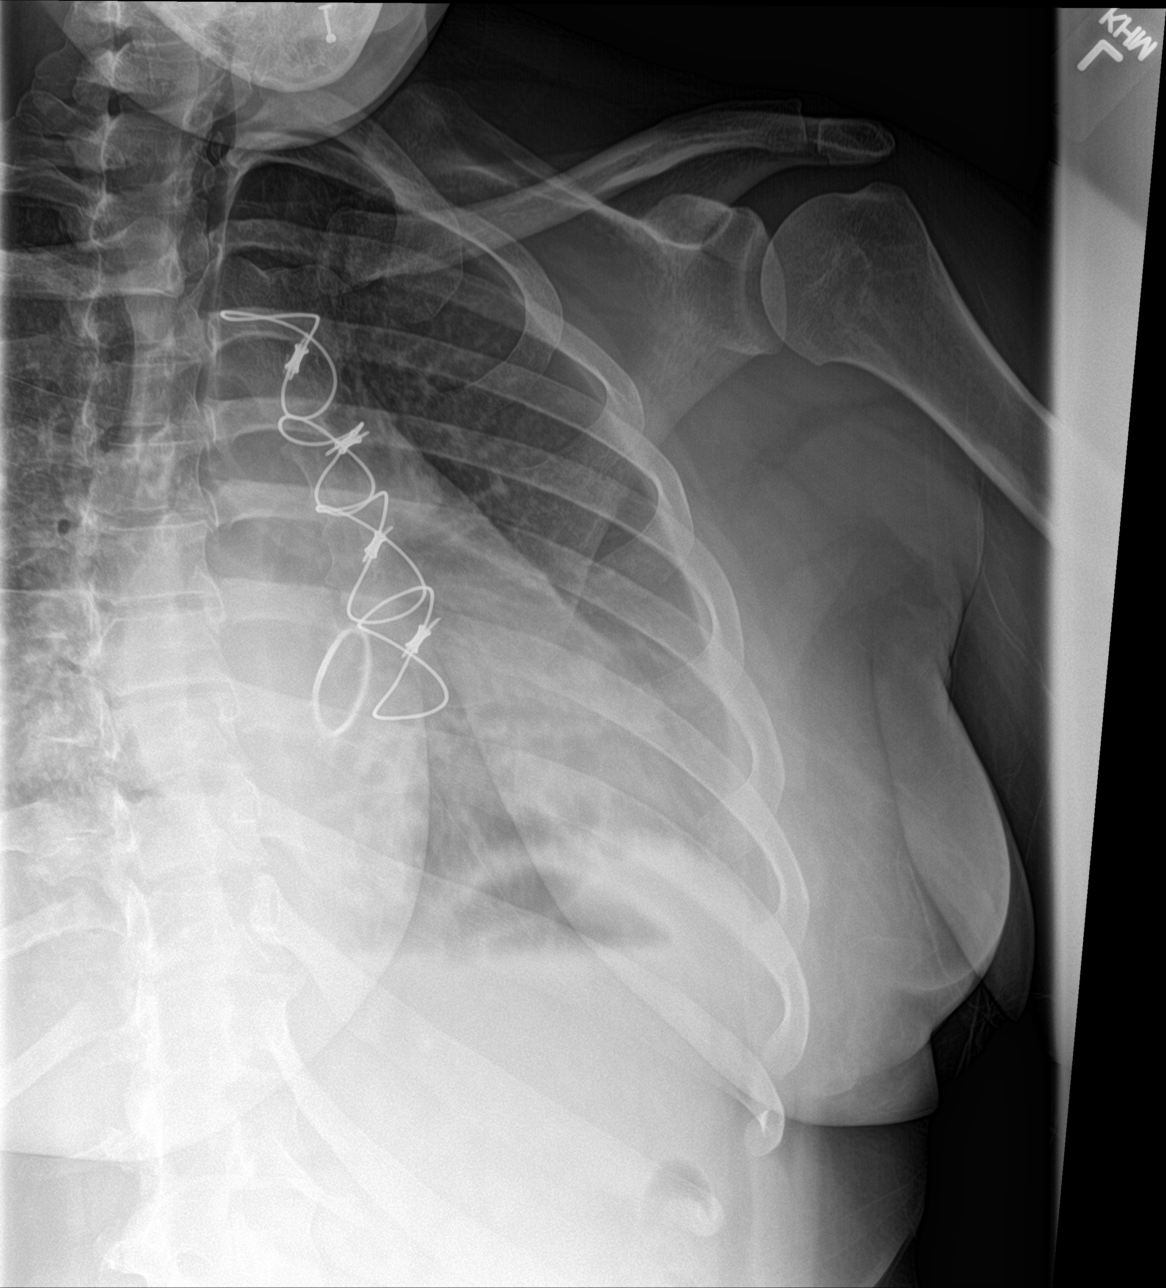

[rib pa obl (2 of 2)]
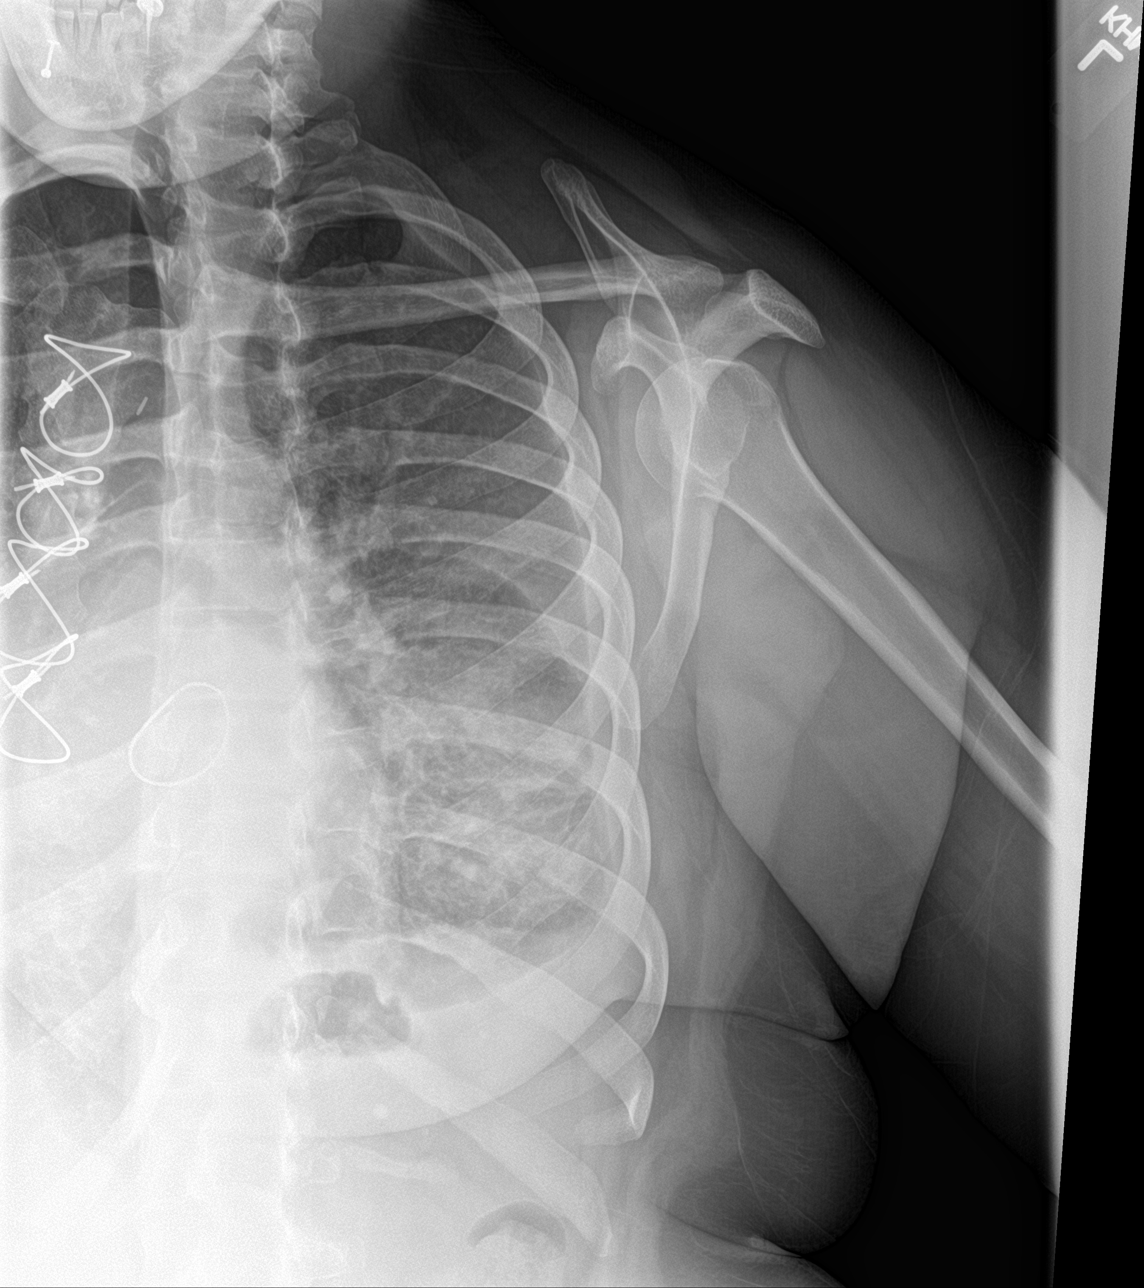

[rib pa]
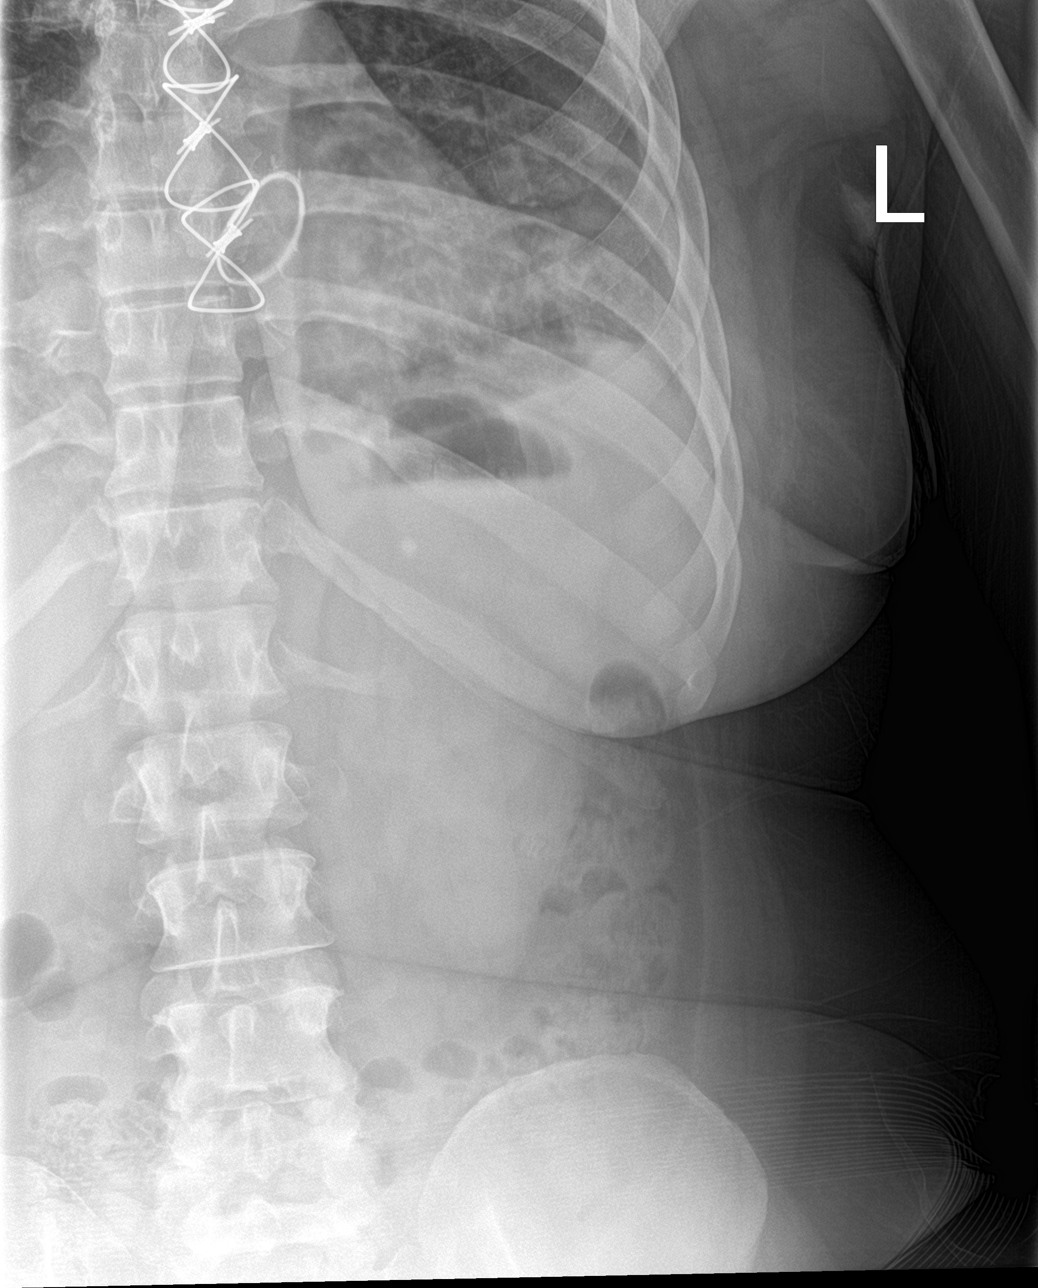

[3 of 3 positions shown; findings below may reference images not displayed]

FINDINGS: No fracture or other bone lesions are seen involving the ribs.
Status post median sternotomy and mitral valvular replacement.
Layering left effusion with pulmonary vascular congestion consistent
with CHF.
IMPRESSION: No acute osseous abnormality. Changes of CHF with small left
effusion and pulmonary vascular congestion.

## 2018-02-01 IMAGING — DX DG CHEST 2V
2 series · 2 of 2 positions shown · non-contrast
Comparison: CT and chest radiograph from 05/23/2016.

CLINICAL DATA: Dyspnea and cough, left lower rib pain.

EXAM:
CHEST  2 VIEW

[chest pa]
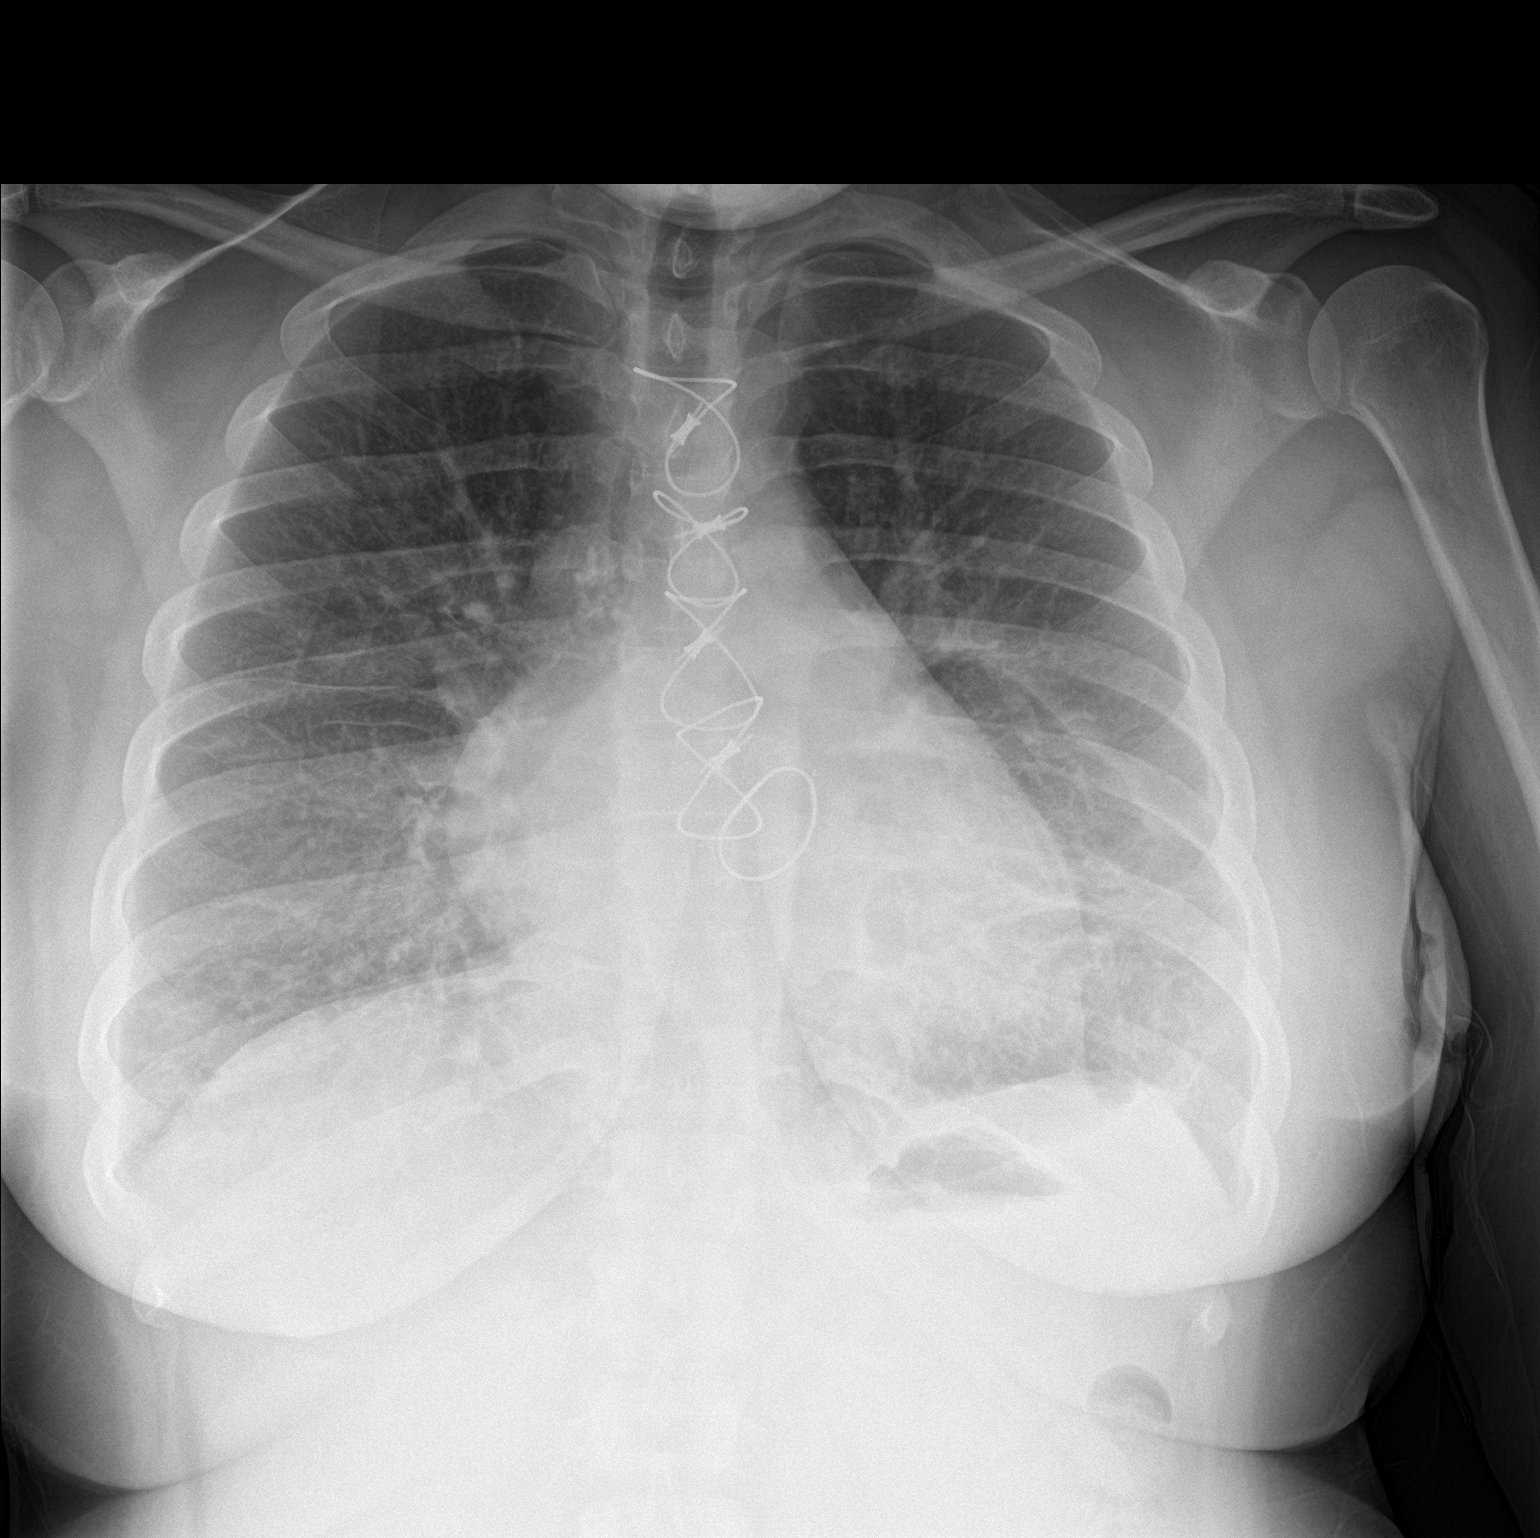

[chest lat]
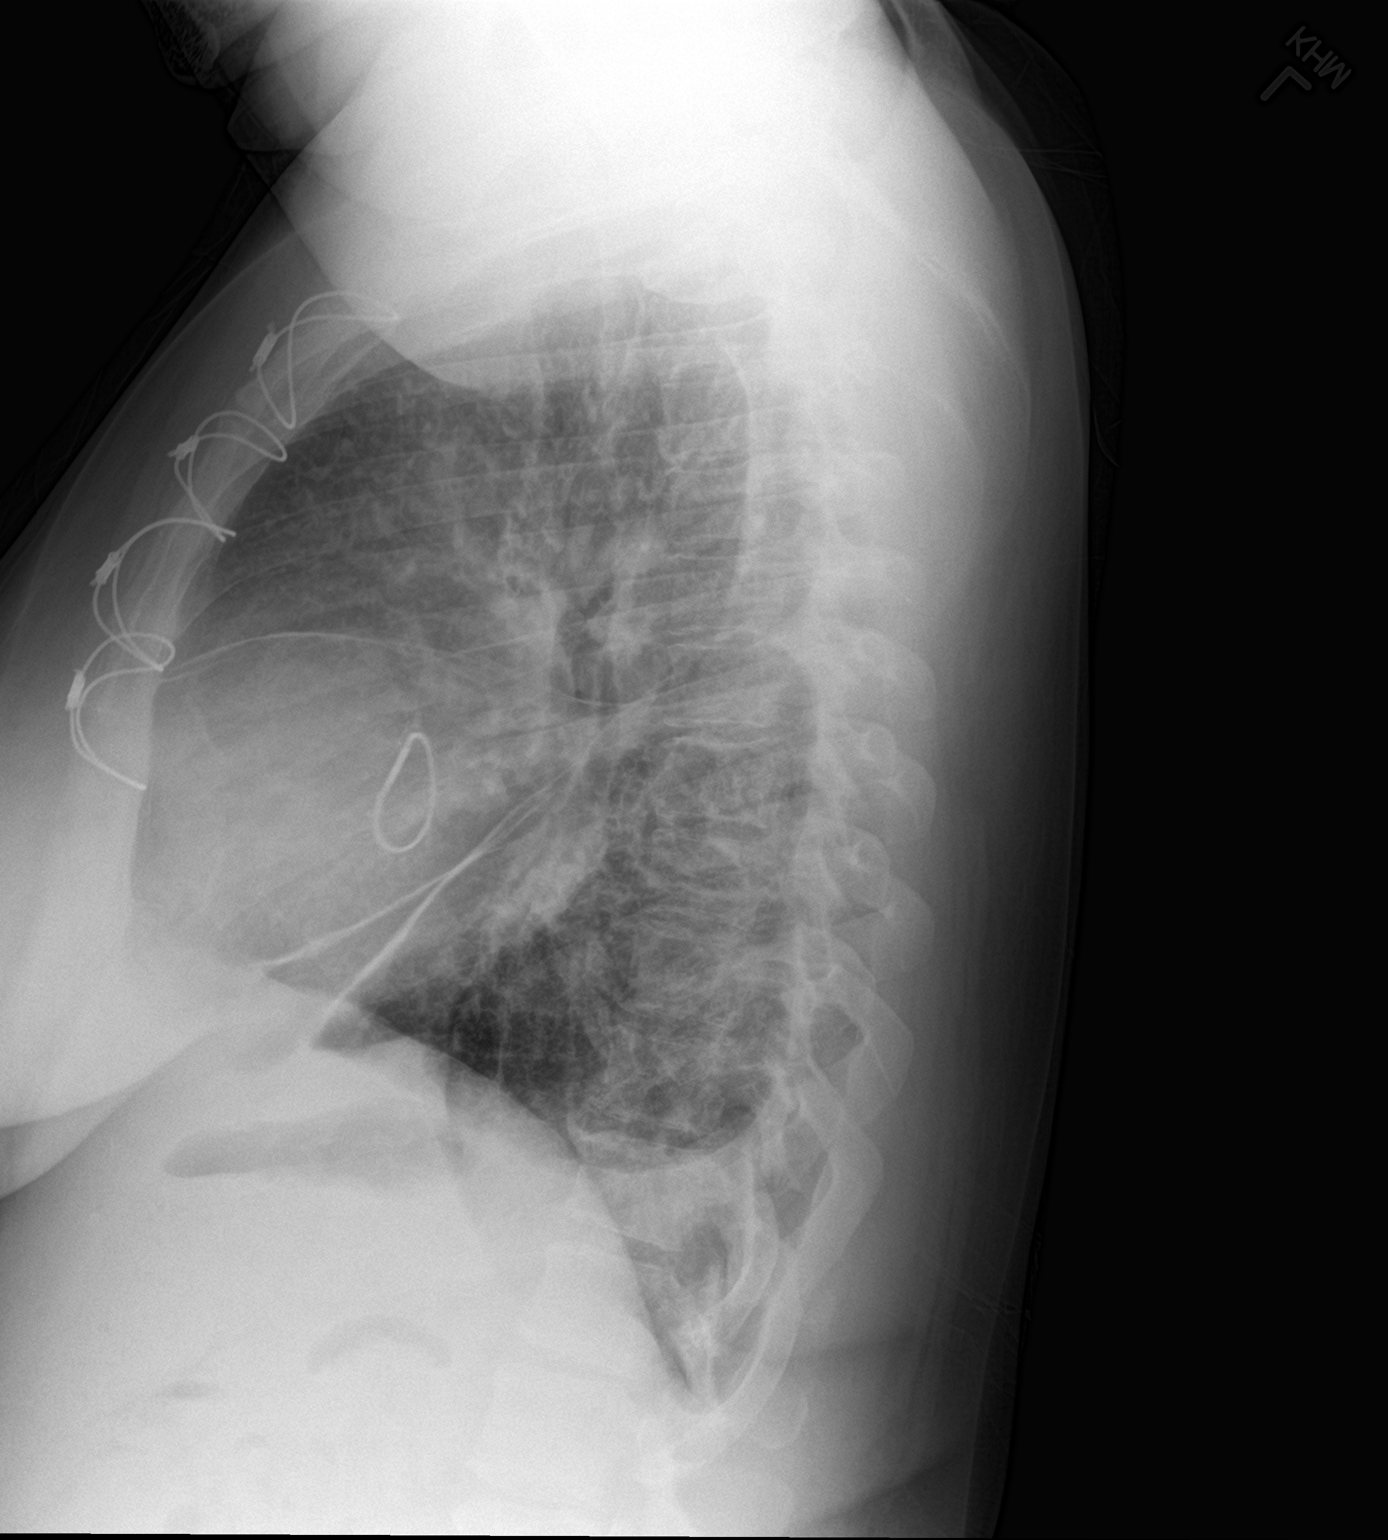

[2 of 2 positions shown; findings below may reference images not displayed]

FINDINGS: Stable cardiomegaly with median sternotomy sutures and mitral
valvular replacement as before. Diffuse hazy appearance of the lungs
suggestive of CHF and likely layering effusions. No acute osseous
abnormality.
IMPRESSION: Cardiomegaly with changes secondary to mild CHF.

## 2018-02-02 NOTE — Addendum Note (Signed)
Addendum  created 02/02/18 0850 by Franco Nones, CRNA   Charge Capture section accepted

## 2018-02-08 ENCOUNTER — Encounter: Payer: Medicaid Other | Admitting: Thoracic Surgery (Cardiothoracic Vascular Surgery)

## 2018-02-09 ENCOUNTER — Other Ambulatory Visit: Payer: Self-pay | Admitting: Adult Health

## 2018-02-09 DIAGNOSIS — I5033 Acute on chronic diastolic (congestive) heart failure: Secondary | ICD-10-CM

## 2018-02-13 ENCOUNTER — Ambulatory Visit: Payer: Medicaid Other | Admitting: Physician Assistant

## 2018-03-04 ENCOUNTER — Encounter (HOSPITAL_COMMUNITY): Payer: Self-pay | Admitting: Emergency Medicine

## 2018-03-04 ENCOUNTER — Emergency Department (HOSPITAL_COMMUNITY)
Admission: EM | Admit: 2018-03-04 | Discharge: 2018-03-04 | Disposition: A | Discharge status: Home / Self Care | Payer: Medicaid Other | Attending: Emergency Medicine | Admitting: Emergency Medicine

## 2018-03-04 DIAGNOSIS — F329 Major depressive disorder, single episode, unspecified: Secondary | ICD-10-CM | POA: Diagnosis not present

## 2018-03-04 DIAGNOSIS — F17210 Nicotine dependence, cigarettes, uncomplicated: Secondary | ICD-10-CM | POA: Insufficient documentation

## 2018-03-04 DIAGNOSIS — M5440 Lumbago with sciatica, unspecified side: Secondary | ICD-10-CM | POA: Insufficient documentation

## 2018-03-04 DIAGNOSIS — Z952 Presence of prosthetic heart valve: Secondary | ICD-10-CM | POA: Diagnosis not present

## 2018-03-04 DIAGNOSIS — M549 Dorsalgia, unspecified: Secondary | ICD-10-CM | POA: Diagnosis present

## 2018-03-04 DIAGNOSIS — F419 Anxiety disorder, unspecified: Secondary | ICD-10-CM | POA: Insufficient documentation

## 2018-03-04 DIAGNOSIS — G8929 Other chronic pain: Secondary | ICD-10-CM

## 2018-03-04 DIAGNOSIS — M5136 Other intervertebral disc degeneration, lumbar region: Secondary | ICD-10-CM

## 2018-03-04 DIAGNOSIS — Z79899 Other long term (current) drug therapy: Secondary | ICD-10-CM | POA: Insufficient documentation

## 2018-03-04 DIAGNOSIS — I5033 Acute on chronic diastolic (congestive) heart failure: Secondary | ICD-10-CM | POA: Insufficient documentation

## 2018-03-04 DIAGNOSIS — I110 Hypertensive heart disease with heart failure: Secondary | ICD-10-CM | POA: Diagnosis not present

## 2018-03-04 MED ORDER — CYCLOBENZAPRINE HCL 10 MG PO TABS
10.0000 mg | ORAL_TABLET | Freq: Once | ORAL | Status: AC
Start: 2018-03-04 — End: 2018-03-04
  Administered 2018-03-04: 10 mg via ORAL
  Filled 2018-03-04: qty 1

## 2018-03-04 MED ORDER — CYCLOBENZAPRINE HCL 10 MG PO TABS: 10.0000 mg | ORAL_TABLET | Freq: Three times a day (TID) | ORAL | 0 refills | Status: DC

## 2018-03-04 MED ORDER — KETOROLAC TROMETHAMINE 60 MG/2ML IM SOLN
60.0000 mg | Freq: Once | INTRAMUSCULAR | Status: AC
Start: 2018-03-04 — End: 2018-03-04
  Administered 2018-03-04: 60 mg via INTRAMUSCULAR
  Filled 2018-03-04: qty 2

## 2018-03-04 MED ORDER — DICLOFENAC SODIUM 75 MG PO TBEC: 75.0000 mg | DELAYED_RELEASE_TABLET | Freq: Two times a day (BID) | ORAL | 0 refills | Status: DC

## 2018-03-04 MED ORDER — TRAMADOL HCL 50 MG PO TABS
50.0000 mg | ORAL_TABLET | Freq: Once | ORAL | Status: AC
  Administered 2018-03-04: 50 mg via ORAL
  Filled 2018-03-04: qty 1

## 2018-03-04 NOTE — ED Provider Notes (Signed)
[facility] EMERGENCY DEPARTMENT Provider Note   CSN: [id number] Arrival date & time: 03/04/18  1742     History   Chief Complaint Chief Complaint  Patient presents with  . Back Pain    HPI Theresa Crawford is a 37 y.o. female.  Patient is a 37 year old female who presents to the emergency department with complaint of back pain.  Patient states she is had problems with her back over the last 2 to 3 years.  She has been told that she has degenerative disc disease.  She has been told that she should be evaluated for possible surgery, but she has been having problems with her heart and has been putting the surgery off for now.  Today she bent over to pick up a piece of fish that she dropped and when she straightened up she had severe pain in her lower back radiating into the buttocks area.  She did not have a loss of bowel or bladder function.  She says that it hurts when she changes positions.  She presents to the emergency department for assistance with her discomfort.     Past Medical History:  Diagnosis Date  . Chronic diastolic CHF (congestive heart failure) (HCC)   . History of open heart surgery   . Hypertension   . Mitral valve disease    mitral regurgitation  . Normocytic anemia   . Obesity   . Premature atrial contractions   . Pulmonary hypertension (HCC) 06/12/2017  . PVC's (premature ventricular contractions)    a. h/o palpitations with event monitor in 03/2017 showing NSR with PACs/PVCs.    Patient Active Problem List   Diagnosis Date Noted  . Dyspnea 01/11/2018  . Prolonged QT interval 09/10/2017  . Normochromic normocytic anemia   . Pulmonary edema 07/05/2017  . Hypokalemia 06/12/2017  . Pulmonary hypertension (HCC) 06/12/2017  . Flash pulmonary edema (HCC) 06/11/2017  . Depression with anxiety 06/11/2017  . Mitral valve stenosis 06/11/2017  . Acute pulmonary edema (HCC) 10/10/2016  . S/P mitral valve repair   . CAP (community acquired pneumonia)  05/25/2016  . Leukocytosis 05/24/2016  . Acute respiratory failure with hypoxia (HCC) 05/23/2016  . Acute on chronic diastolic CHF (congestive heart failure) (HCC) 05/23/2016  . Cough with hemoptysis 05/23/2016  . Postpartum complication pericarditis in 2009 with eventual needing MVR in 2015 05/23/2016  . Anemia, iron deficiency 05/23/2016  . Hypertension   . SOB (shortness of breath)   . Respiratory distress 02/16/2016  . Essential hypertension 02/16/2016    Past Surgical History:  Procedure Laterality Date  . ABDOMINAL HYSTERECTOMY    . CESAREAN SECTION     X's 2  . MITRAL VALVE REPAIR  03/2013    [facility] in [place]   . PARTIAL HYSTERECTOMY  2011   PID w/problem with fallopian tubes  . TEE WITHOUT CARDIOVERSION N/A 05/26/2016   Procedure: TRANSESOPHAGEAL ECHOCARDIOGRAM (TEE);  Surgeon: Laqueta LindenSuresh A Koneswaran, MD;  Location: [facility];  Service: Cardiovascular;  Laterality: N/A;  . TEE WITHOUT CARDIOVERSION N/A 01/25/2018   Procedure: TRANSESOPHAGEAL ECHOCARDIOGRAM (TEE) WITH PROPOFOL;  Surgeon: Jonelle SidleMcDowell, Samuel G, MD;  Location: [facility];  Service: Cardiovascular;  Laterality: N/A;     OB History    Gravida  2   Para  2   Term  2   Preterm      AB      Living        SAB      TAB  Ectopic      Multiple      Live Births               Home Medications    Prior to Admission medications   Medication Sig Start Date End Date Taking? Authorizing Provider  albuterol (PROVENTIL HFA;VENTOLIN HFA) 108 (90 Base) MCG/ACT inhaler Inhale 1-2 puffs into the lungs every 6 (six) hours as needed for wheezing or shortness of breath.    [provider]  citalopram (CELEXA) 40 MG tablet Take 40 mg by mouth at bedtime.     [provider]  docusate sodium (COLACE) 100 MG capsule Take 100 mg by mouth daily.    [provider]  EPINEPHrine (EPIPEN 2-PAK) 0.3 mg/0.3 mL IJ SOAJ injection Inject 0.3 Units into the  muscle once as needed.    [provider]  ferrous sulfate 325 (65 FE) MG tablet Take 325 mg by mouth 2 (two) times daily with a meal.    [provider]  furosemide (LASIX) 80 MG tablet TAKE 1 TABLET BY MOUTH EVERY DAY 02/09/18   Antoine Poche, MD  metoprolol succinate (TOPROL-XL) 25 MG 24 hr tablet TAKE 1 TABLET BY MOUTH DAILY 02/09/18   Antoine Poche, MD  potassium chloride SA (K-DUR,KLOR-CON) 20 MEQ tablet Take 2 tablets (40 mEq total) by mouth daily. 01/12/18 02/11/18  Erick Blinks, MD  Probiotic Product (PROBIOTIC DAILY PO) Take 1 capsule by mouth daily.    [provider]  QUEtiapine (SEROQUEL) 25 MG tablet Take 25 mg by mouth at bedtime.     [provider]  torsemide (DEMADEX) 20 MG tablet Take 1 tab po daily, take 1 extra tablet daily for weight gain of more than 3 lbs in a day 01/12/18   Erick Blinks, MD    Family History Family History  Problem Relation Age of Onset  . Heart failure Father        just received LVAD  . Heart disease Paternal Grandfather        stent placement    Social History Social History   Tobacco Use  . Smoking status: Current Some Day Smoker    Packs/day: 0.25    Types: Cigarettes    Start date: 11/03/1999  . Smokeless tobacco: Never Used  Substance Use Topics  . Alcohol use: No    Alcohol/week: 0.0 oz  . Drug use: No     Allergies   Lisinopril; Penicillins; Perflutren lipid microsphere; Bee venom; Contrast media [iodinated diagnostic agents]; and Shellfish allergy   Review of Systems Review of Systems  Constitutional: Negative for activity change.       All ROS Neg except as noted in HPI  HENT: Negative for nosebleeds.   Eyes: Negative for photophobia and discharge.  Respiratory: Negative for cough, shortness of breath and wheezing.   Cardiovascular: Negative for chest pain and palpitations.  Gastrointestinal: Negative for abdominal pain and blood in stool.  Genitourinary: Negative for  dysuria, frequency and hematuria.  Musculoskeletal: Positive for back pain. Negative for arthralgias and neck pain.  Skin: Negative.   Neurological: Negative for dizziness, seizures and speech difficulty.  Psychiatric/Behavioral: Negative for confusion, hallucinations and suicidal ideas. The patient is nervous/anxious.      Physical Exam Updated Vital Signs BP 120/61 (BP Location: Right Arm)   Pulse 90   Temp 98.1 F (36.7 C) (Oral)   Resp 18   Ht 5\' 4"  (1.626 m)   Wt 97.1 kg (214 lb)  LMP 02/02/2018   SpO2 100%   BMI 36.73 kg/m   Physical Exam  Constitutional: She is oriented to person, place, and time. She appears well-developed and well-nourished.  Non-toxic appearance.  HENT:  Head: Normocephalic.  Right Ear: Tympanic membrane and external ear normal.  Left Ear: Tympanic membrane and external ear normal.  Eyes: Pupils are equal, round, and reactive to light. EOM and lids are normal.  Neck: Normal range of motion. Neck supple. Carotid bruit is not present.  Cardiovascular: Normal rate, regular rhythm, normal heart sounds, intact distal pulses and normal pulses.  Pulmonary/Chest: Breath sounds normal. No respiratory distress.  Abdominal: Soft. Bowel sounds are normal. There is no tenderness. There is no guarding.  Musculoskeletal:       Lumbar back: She exhibits decreased range of motion, pain and spasm.       Back:  Lymphadenopathy:       Head (right side): No submandibular adenopathy present.       Head (left side): No submandibular adenopathy present.    She has no cervical adenopathy.  Neurological: She is alert and oriented to person, place, and time. She has normal strength. No cranial nerve deficit or sensory deficit.  Skin: Skin is warm and dry.  Psychiatric: Her speech is normal. Her mood appears anxious. She expresses no homicidal and no suicidal ideation. She expresses no suicidal plans and no homicidal plans.  Nursing note and vitals reviewed.    ED  Treatments / Results  Labs (all labs ordered are listed, but only abnormal results are displayed) Labs Reviewed - No data to display  EKG None  Radiology No results found.  Procedures Procedures (including critical care time)  Medications Ordered in ED Medications  cyclobenzaprine (FLEXERIL) tablet 10 mg (10 mg Oral Given 03/04/18 1908)  ketorolac (TORADOL) injection 60 mg (60 mg Intramuscular Given 03/04/18 1908)  traMADol (ULTRAM) tablet 50 mg (50 mg Oral Given 03/04/18 1908)     Initial Impression / Assessment and Plan / ED Course  I have reviewed the triage vital signs and the nursing notes.  Pertinent labs & imaging results that were available during my care of the patient were reviewed by me and considered in my medical decision making (see chart for details).       Final Clinical Impressions(s) / ED Diagnoses MDM  Vital signs reviewed.  Pulse oximetry is 100% on room air.  Within normal limits by my interpretation.  I have reviewed the previous x-rays and CT scans of the lumbar spine.  Patient has degenerative disc disease involving the L4-L5, as well as the L5-S1 area.  The examination today is negative for cauda equina or other emergent changes.  The examination favors exacerbation of the chronic and ongoing back issue.  The patient states that she will have symptoms similar to this about every other month.  Today the pain was worse than usual and so she came to the emergency department.  The patient will be treated with Flexeril and diclofenac.  The patient requested Ultram.  Ultram can cause some prolonged QT issues.  The patient has a cardiac history.  I have asked the patient to discuss this with her primary physician for approval of the Ultram.  I have asked the patient to use a heating pad, and to rest her back is much as possible.   Final diagnoses:  Chronic right-sided low back pain with sciatica, sciatica laterality unspecified    ED Discharge Orders     None  Ivery Quale, PA-C 03/04/18 1947    Eber Hong, MD 03/04/18 2120

## 2018-03-04 NOTE — ED Notes (Signed)
Ice [employer] Applied to lower back

## 2018-03-04 NOTE — Discharge Instructions (Addendum)
Your examination suggest an exacerbation of your previously diagnosed degenerative disc disease in the lumbar area.  There also seems to be some spasm about the right lower back area.  Please use a heating pad.  Please rest her back is much as possible.  Please use diclofenac 2 times daily with food, and use of Flexeril 3 times daily for spasm pain.  Flexeril may cause drowsiness, and/or lightheadedness.  Please do not drive a vehicle, operate machinery, drink alcohol, or participate in activities requiring concentration when taking Flexeril.  Please discuss the use of Ultram with Ms. House.  There are some heart related side effects with this medication.  This should be discussed with Ms. House, or your cardiologist before this medication as prescribed for you.

## 2018-03-04 NOTE — ED Triage Notes (Signed)
Pt reports back pain since waking this morning.  States it always hurts when it is time for her period.  Pt also talking continuously at triage about irrelevant things and how lonely she is.

## 2018-03-07 ENCOUNTER — Encounter (HOSPITAL_COMMUNITY): Payer: Self-pay | Admitting: Emergency Medicine

## 2018-03-07 ENCOUNTER — Emergency Department (HOSPITAL_COMMUNITY): Payer: Medicaid Other

## 2018-03-07 ENCOUNTER — Inpatient Hospital Stay (HOSPITAL_COMMUNITY)
Admission: EM | Admit: 2018-03-07 | Discharge: 2018-03-08 | DRG: 293 | Disposition: A | Discharge status: Home / Self Care | Payer: Medicaid Other | Attending: Internal Medicine | Admitting: Internal Medicine

## 2018-03-07 ENCOUNTER — Inpatient Hospital Stay (HOSPITAL_COMMUNITY): Payer: Medicaid Other

## 2018-03-07 ENCOUNTER — Other Ambulatory Visit: Payer: Self-pay

## 2018-03-07 DIAGNOSIS — F329 Major depressive disorder, single episode, unspecified: Secondary | ICD-10-CM | POA: Diagnosis present

## 2018-03-07 DIAGNOSIS — I050 Rheumatic mitral stenosis: Secondary | ICD-10-CM | POA: Diagnosis present

## 2018-03-07 DIAGNOSIS — M79606 Pain in leg, unspecified: Secondary | ICD-10-CM

## 2018-03-07 DIAGNOSIS — Z720 Tobacco use: Secondary | ICD-10-CM | POA: Diagnosis not present

## 2018-03-07 DIAGNOSIS — Z91041 Radiographic dye allergy status: Secondary | ICD-10-CM

## 2018-03-07 DIAGNOSIS — D509 Iron deficiency anemia, unspecified: Secondary | ICD-10-CM | POA: Diagnosis present

## 2018-03-07 DIAGNOSIS — E876 Hypokalemia: Secondary | ICD-10-CM | POA: Diagnosis present

## 2018-03-07 DIAGNOSIS — Z79899 Other long term (current) drug therapy: Secondary | ICD-10-CM | POA: Diagnosis not present

## 2018-03-07 DIAGNOSIS — T502X5A Adverse effect of carbonic-anhydrase inhibitors, benzothiadiazides and other diuretics, initial encounter: Secondary | ICD-10-CM | POA: Diagnosis present

## 2018-03-07 DIAGNOSIS — Z91013 Allergy to seafood: Secondary | ICD-10-CM

## 2018-03-07 DIAGNOSIS — I110 Hypertensive heart disease with heart failure: Principal | ICD-10-CM | POA: Diagnosis present

## 2018-03-07 DIAGNOSIS — R0789 Other chest pain: Secondary | ICD-10-CM | POA: Diagnosis present

## 2018-03-07 DIAGNOSIS — I052 Rheumatic mitral stenosis with insufficiency: Secondary | ICD-10-CM | POA: Diagnosis present

## 2018-03-07 DIAGNOSIS — Z880 Allergy status to penicillin: Secondary | ICD-10-CM | POA: Diagnosis not present

## 2018-03-07 DIAGNOSIS — Z8249 Family history of ischemic heart disease and other diseases of the circulatory system: Secondary | ICD-10-CM

## 2018-03-07 DIAGNOSIS — Z91030 Bee allergy status: Secondary | ICD-10-CM

## 2018-03-07 DIAGNOSIS — F17210 Nicotine dependence, cigarettes, uncomplicated: Secondary | ICD-10-CM | POA: Diagnosis present

## 2018-03-07 DIAGNOSIS — I272 Pulmonary hypertension, unspecified: Secondary | ICD-10-CM | POA: Diagnosis present

## 2018-03-07 DIAGNOSIS — Z888 Allergy status to other drugs, medicaments and biological substances status: Secondary | ICD-10-CM

## 2018-03-07 DIAGNOSIS — M791 Myalgia, unspecified site: Secondary | ICD-10-CM

## 2018-03-07 DIAGNOSIS — I5033 Acute on chronic diastolic (congestive) heart failure: Secondary | ICD-10-CM | POA: Diagnosis present

## 2018-03-07 DIAGNOSIS — R0601 Orthopnea: Secondary | ICD-10-CM | POA: Diagnosis present

## 2018-03-07 DIAGNOSIS — R9431 Abnormal electrocardiogram [ECG] [EKG]: Secondary | ICD-10-CM | POA: Diagnosis not present

## 2018-03-07 DIAGNOSIS — I342 Nonrheumatic mitral (valve) stenosis: Secondary | ICD-10-CM | POA: Diagnosis not present

## 2018-03-07 LAB — BASIC METABOLIC PANEL
Anion gap: 9 (ref 5–15)
BUN: 13 mg/dL (ref 6–20)
CALCIUM: 8.3 mg/dL — AB (ref 8.9–10.3)
CO2: 28 mmol/L (ref 22–32)
Chloride: 101 mmol/L (ref 98–111)
Creatinine, Ser: 0.81 mg/dL (ref 0.44–1.00)
GLUCOSE: 100 mg/dL — AB (ref 70–99)
Potassium: 2.5 mmol/L — CL (ref 3.5–5.1)
SODIUM: 138 mmol/L (ref 135–145)

## 2018-03-07 LAB — URINALYSIS, COMPLETE (UACMP) WITH MICROSCOPIC
Bacteria, UA: NONE SEEN
Bilirubin Urine: NEGATIVE
Glucose, UA: NEGATIVE mg/dL
Hgb urine dipstick: NEGATIVE
Ketones, ur: NEGATIVE mg/dL
Leukocytes, UA: NEGATIVE
Nitrite: NEGATIVE
PH: 8 (ref 5.0–8.0)
Protein, ur: NEGATIVE mg/dL
Specific Gravity, Urine: 1.004 — ABNORMAL LOW (ref 1.005–1.030)

## 2018-03-07 LAB — RESPIRATORY PANEL BY PCR
Adenovirus: NOT DETECTED
Bordetella pertussis: NOT DETECTED
CORONAVIRUS 229E-RVPPCR: NOT DETECTED
CORONAVIRUS OC43-RVPPCR: NOT DETECTED
Chlamydophila pneumoniae: NOT DETECTED
Coronavirus HKU1: NOT DETECTED
Coronavirus NL63: NOT DETECTED
INFLUENZA A-RVPPCR: NOT DETECTED
INFLUENZA B-RVPPCR: NOT DETECTED
METAPNEUMOVIRUS-RVPPCR: NOT DETECTED
Mycoplasma pneumoniae: NOT DETECTED
PARAINFLUENZA VIRUS 1-RVPPCR: NOT DETECTED
PARAINFLUENZA VIRUS 2-RVPPCR: NOT DETECTED
PARAINFLUENZA VIRUS 4-RVPPCR: NOT DETECTED
Parainfluenza Virus 3: NOT DETECTED
RESPIRATORY SYNCYTIAL VIRUS-RVPPCR: NOT DETECTED
Rhinovirus / Enterovirus: NOT DETECTED

## 2018-03-07 LAB — TROPONIN I
Troponin I: <0.03 ng/mL (ref <0.03)
Troponin I: <0.03 ng/mL (ref <0.03)
Troponin I: <0.03 ng/mL (ref <0.03)

## 2018-03-07 LAB — CG4 I-STAT (LACTIC ACID): Lactic Acid, Venous: 0.95 mmol/L (ref 0.5–1.9)

## 2018-03-07 LAB — CBC WITH DIFFERENTIAL/PLATELET
BASOS ABS: 0 10*3/uL (ref 0.0–0.1)
BASOS PCT: 0 %
EOS ABS: 0.1 10*3/uL (ref 0.0–0.7)
EOS PCT: 1 %
HCT: 30.2 % — ABNORMAL LOW (ref 36.0–46.0)
Hemoglobin: 9.4 g/dL — ABNORMAL LOW (ref 12.0–15.0)
Lymphocytes Relative: 11 %
Lymphs Abs: 1.6 10*3/uL (ref 0.7–4.0)
MCH: 24 pg — ABNORMAL LOW (ref 26.0–34.0)
MCHC: 31.1 g/dL (ref 30.0–36.0)
MCV: 77 fL — AB (ref 78.0–100.0)
MONO ABS: 0.8 10*3/uL (ref 0.1–1.0)
Monocytes Relative: 5 %
Neutro Abs: 12.3 10*3/uL — ABNORMAL HIGH (ref 1.7–7.7)
Neutrophils Relative %: 83 %
PLATELETS: 404 10*3/uL — AB (ref 150–400)
RBC: 3.92 MIL/uL (ref 3.87–5.11)
RDW: 17.9 % — AB (ref 11.5–15.5)
WBC: 14.8 10*3/uL — AB (ref 4.0–10.5)

## 2018-03-07 LAB — MAGNESIUM: Magnesium: 1.6 mg/dL — ABNORMAL LOW (ref 1.7–2.4)

## 2018-03-07 LAB — D-DIMER, QUANTITATIVE (NOT AT ARMC): D DIMER QUANT: 1.69 ug{FEU}/mL — AB (ref 0.00–0.50)

## 2018-03-07 LAB — PROCALCITONIN

## 2018-03-07 LAB — CK: Total CK: 52 U/L (ref 38–234)

## 2018-03-07 LAB — BRAIN NATRIURETIC PEPTIDE: B Natriuretic Peptide: 333 pg/mL — ABNORMAL HIGH (ref 0.0–100.0)

## 2018-03-07 MED ORDER — SODIUM CHLORIDE 0.9 % IV BOLUS
500.0000 mL | Freq: Once | INTRAVENOUS | Status: AC
  Administered 2018-03-07: 500 mL via INTRAVENOUS

## 2018-03-07 MED ORDER — POTASSIUM CHLORIDE CRYS ER 20 MEQ PO TBCR
40.0000 meq | EXTENDED_RELEASE_TABLET | Freq: Two times a day (BID) | ORAL | Status: DC
  Administered 2018-03-07 – 2018-03-08 (×3): 40 meq via ORAL
  Filled 2018-03-07 (×4): qty 2

## 2018-03-07 MED ORDER — MAGNESIUM SULFATE 2 GM/50ML IV SOLN
2.0000 g | Freq: Once | INTRAVENOUS | Status: AC
  Administered 2018-03-07: 2 g via INTRAVENOUS
  Filled 2018-03-07: qty 50

## 2018-03-07 MED ORDER — POTASSIUM CHLORIDE CRYS ER 20 MEQ PO TBCR
40.0000 meq | EXTENDED_RELEASE_TABLET | Freq: Once | ORAL | Status: AC
  Administered 2018-03-07: 40 meq via ORAL
  Filled 2018-03-07: qty 2

## 2018-03-07 MED ORDER — MORPHINE SULFATE (PF) 4 MG/ML IV SOLN
4.0000 mg | Freq: Once | INTRAVENOUS | Status: AC
  Administered 2018-03-07: 4 mg via INTRAVENOUS
  Filled 2018-03-07: qty 1

## 2018-03-07 MED ORDER — SODIUM CHLORIDE 0.9 % IV SOLN: 250.0000 mL | INTRAVENOUS | Status: DC | PRN

## 2018-03-07 MED ORDER — DOCUSATE SODIUM 100 MG PO CAPS
100.0000 mg | ORAL_CAPSULE | Freq: Every day | ORAL | Status: DC
  Administered 2018-03-07 – 2018-03-08 (×2): 100 mg via ORAL
  Filled 2018-03-07 (×2): qty 1

## 2018-03-07 MED ORDER — ONDANSETRON HCL 4 MG/2ML IJ SOLN: 4.0000 mg | Freq: Four times a day (QID) | INTRAMUSCULAR | Status: DC | PRN

## 2018-03-07 MED ORDER — FUROSEMIDE 10 MG/ML IJ SOLN
40.0000 mg | Freq: Once | INTRAMUSCULAR | Status: AC
  Administered 2018-03-07: 40 mg via INTRAVENOUS
  Filled 2018-03-07: qty 4

## 2018-03-07 MED ORDER — FUROSEMIDE 10 MG/ML IJ SOLN
40.0000 mg | Freq: Two times a day (BID) | INTRAMUSCULAR | Status: DC
  Administered 2018-03-07 – 2018-03-08 (×2): 40 mg via INTRAVENOUS
  Filled 2018-03-07 (×2): qty 4

## 2018-03-07 MED ORDER — SODIUM CHLORIDE 0.9% FLUSH: 3.0000 mL | INTRAVENOUS | Status: DC | PRN

## 2018-03-07 MED ORDER — POTASSIUM CHLORIDE 10 MEQ/100ML IV SOLN
10.0000 meq | INTRAVENOUS | Status: AC
  Administered 2018-03-07 (×4): 10 meq via INTRAVENOUS
  Filled 2018-03-07 (×4): qty 100

## 2018-03-07 MED ORDER — SODIUM CHLORIDE 0.9% FLUSH
3.0000 mL | Freq: Two times a day (BID) | INTRAVENOUS | Status: DC
  Administered 2018-03-07 – 2018-03-08 (×3): 3 mL via INTRAVENOUS

## 2018-03-07 MED ORDER — ENOXAPARIN SODIUM 40 MG/0.4ML ~~LOC~~ SOLN
40.0000 mg | SUBCUTANEOUS | Status: DC
  Administered 2018-03-07 – 2018-03-08 (×2): 40 mg via SUBCUTANEOUS
  Filled 2018-03-07 (×2): qty 0.4

## 2018-03-07 MED ORDER — CITALOPRAM HYDROBROMIDE 20 MG PO TABS: 40.0000 mg | ORAL_TABLET | Freq: Every day | ORAL | Status: DC

## 2018-03-07 MED ORDER — METOPROLOL SUCCINATE ER 25 MG PO TB24
25.0000 mg | ORAL_TABLET | Freq: Every day | ORAL | Status: DC
  Administered 2018-03-07 – 2018-03-08 (×2): 25 mg via ORAL
  Filled 2018-03-07 (×2): qty 1

## 2018-03-07 MED ORDER — ACETAMINOPHEN 325 MG PO TABS
650.0000 mg | ORAL_TABLET | ORAL | Status: DC | PRN
  Administered 2018-03-07: 650 mg via ORAL
  Filled 2018-03-07: qty 2

## 2018-03-07 MED ORDER — FERROUS SULFATE 325 (65 FE) MG PO TABS
325.0000 mg | ORAL_TABLET | Freq: Two times a day (BID) | ORAL | Status: DC
Start: 2018-03-07 — End: 2018-03-08
  Administered 2018-03-07 – 2018-03-08 (×4): 325 mg via ORAL
  Filled 2018-03-07 (×4): qty 1

## 2018-03-07 NOTE — Progress Notes (Signed)
Pt admitted to room 323 in stable condition. Alert and oriented. IV patent and infusing at this time.

## 2018-03-07 NOTE — ED Triage Notes (Signed)
Pt c/o bilateral leg pain that started today, she states she took diuretics earlier today.

## 2018-03-07 NOTE — ED Provider Notes (Signed)
[facility] EMERGENCY DEPARTMENT Provider Note   CSN: [id number] Arrival date & time: 03/07/18  0248     History   Chief Complaint Chief Complaint  Patient presents with  . Leg Pain    HPI Theresa Crawford is a 37 y.o. female.  Patient with history of diastolic heart failure, mitral valve surgery, PVCs presenting with severe leg cramping and pain that started several hours ago while she was resting at home.  Reports her left leg is hurting more than her right.  She describes cramps throughout her feet, lower leg and upper legs.  She denies any falls or trauma.  She is never had this happen before.  She does take diuretics at home but denies any recent changes to these medications.  Denies any chest pain or shortness of breath.  No cough or fever.  She is recently told that her mitral valve regurgitation is getting worse and she will need to have surgery.  She does not take any blood thinners.  Did have a fever to 100.4 at home.  The history is provided by the patient.  Leg Pain      Past Medical History:  Diagnosis Date  . Chronic diastolic CHF (congestive heart failure) (HCC)   . History of open heart surgery   . Hypertension   . Mitral valve disease    mitral regurgitation  . Normocytic anemia   . Obesity   . Premature atrial contractions   . Pulmonary hypertension (HCC) 06/12/2017  . PVC's (premature ventricular contractions)    a. h/o palpitations with event monitor in 03/2017 showing NSR with PACs/PVCs.    Patient Active Problem List   Diagnosis Date Noted  . Dyspnea 01/11/2018  . Prolonged QT interval 09/10/2017  . Normochromic normocytic anemia   . Pulmonary edema 07/05/2017  . Hypokalemia 06/12/2017  . Pulmonary hypertension (HCC) 06/12/2017  . Flash pulmonary edema (HCC) 06/11/2017  . Depression with anxiety 06/11/2017  . Mitral valve stenosis 06/11/2017  . Acute pulmonary edema (HCC) 10/10/2016  . S/P mitral valve repair   . CAP (community acquired  pneumonia) 05/25/2016  . Leukocytosis 05/24/2016  . Acute respiratory failure with hypoxia (HCC) 05/23/2016  . Acute on chronic diastolic CHF (congestive heart failure) (HCC) 05/23/2016  . Cough with hemoptysis 05/23/2016  . Postpartum complication pericarditis in 2009 with eventual needing MVR in 2015 05/23/2016  . Anemia, iron deficiency 05/23/2016  . Hypertension   . SOB (shortness of breath)   . Respiratory distress 02/16/2016  . Essential hypertension 02/16/2016    Past Surgical History:  Procedure Laterality Date  . ABDOMINAL HYSTERECTOMY    . CESAREAN SECTION     X's 2  . MITRAL VALVE REPAIR  03/2013    [facility] in [place], [place]   . PARTIAL HYSTERECTOMY  2011   PID w/problem with fallopian tubes  . TEE WITHOUT CARDIOVERSION N/A 05/26/2016   Procedure: TRANSESOPHAGEAL ECHOCARDIOGRAM (TEE);  Surgeon: [name], MD;  Location: [facility];  Service: Cardiovascular;  Laterality: N/A;  . TEE WITHOUT CARDIOVERSION N/A 01/25/2018   Procedure: TRANSESOPHAGEAL ECHOCARDIOGRAM (TEE) WITH PROPOFOL;  Surgeon: Jonelle Sidle, MD;  Location: [facility];  Service: Cardiovascular;  Laterality: N/A;     OB History    Gravida  2   Para  2   Term  2   Preterm      AB      Living        SAB  TAB      Ectopic      Multiple      Live Births               Home Medications    Prior to Admission medications   Medication Sig Start Date End Date Taking? Authorizing Provider  albuterol (PROVENTIL HFA;VENTOLIN HFA) 108 (90 Base) MCG/ACT inhaler Inhale 1-2 puffs into the lungs every 6 (six) hours as needed for wheezing or shortness of breath.    [provider]  citalopram (CELEXA) 40 MG tablet Take 40 mg by mouth at bedtime.     [provider]  cyclobenzaprine (FLEXERIL) 10 MG tablet Take 1 tablet (10 mg total) by mouth 3 (three) times daily. 03/04/18   Ivery QualeBryant, Hobson, PA-C  diclofenac (VOLTAREN) 75 MG EC tablet Take 1  tablet (75 mg total) by mouth 2 (two) times daily. 03/04/18   Ivery QualeBryant, Hobson, PA-C  docusate sodium (COLACE) 100 MG capsule Take 100 mg by mouth daily.    [provider]  EPINEPHrine (EPIPEN 2-PAK) 0.3 mg/0.3 mL IJ SOAJ injection Inject 0.3 Units into the muscle once as needed.    [provider]  ferrous sulfate 325 (65 FE) MG tablet Take 325 mg by mouth 2 (two) times daily with a meal.    [provider]  furosemide (LASIX) 80 MG tablet TAKE 1 TABLET BY MOUTH EVERY DAY 02/09/18   Antoine PocheBranch, Jonathan F, MD  metoprolol succinate (TOPROL-XL) 25 MG 24 hr tablet TAKE 1 TABLET BY MOUTH DAILY 02/09/18   Antoine PocheBranch, Jonathan F, MD  potassium chloride SA (K-DUR,KLOR-CON) 20 MEQ tablet Take 2 tablets (40 mEq total) by mouth daily. 01/12/18 02/11/18  Erick BlinksMemon, Jehanzeb, MD  Probiotic Product (PROBIOTIC DAILY PO) Take 1 capsule by mouth daily.    [provider]  QUEtiapine (SEROQUEL) 25 MG tablet Take 25 mg by mouth at bedtime.     [provider]  torsemide (DEMADEX) 20 MG tablet Take 1 tab po daily, take 1 extra tablet daily for weight gain of more than 3 lbs in a day 01/12/18   Erick BlinksMemon, Jehanzeb, MD    Family History Family History  Problem Relation Age of Onset  . Heart failure Father        just received LVAD  . Heart disease Paternal Grandfather        stent placement    Social History Social History   Tobacco Use  . Smoking status: Current Some Day Smoker    Packs/day: 0.25    Types: Cigarettes    Start date: 11/03/1999  . Smokeless tobacco: Never Used  Substance Use Topics  . Alcohol use: No    Alcohol/week: 0.0 oz  . Drug use: No     Allergies   Lisinopril; Penicillins; Perflutren lipid microsphere; Bee venom; Contrast media [iodinated diagnostic agents]; and Shellfish allergy   Review of Systems Review of Systems  Constitutional: Negative for activity change and appetite change.  HENT: Negative for congestion, rhinorrhea and sinus pain.   Eyes:  Negative for visual disturbance.  Respiratory: Negative for cough, chest tightness and shortness of breath.   Cardiovascular: Positive for leg swelling. Negative for chest pain.  Gastrointestinal: Negative for abdominal pain, nausea and vomiting.  Genitourinary: Negative for dysuria, hematuria, vaginal bleeding and vaginal discharge.  Musculoskeletal: Positive for arthralgias and myalgias.  Skin: Negative for rash.  Neurological: Negative for dizziness, weakness and headaches.    all other systems are negative except as noted in the HPI  and PMH.    Physical Exam Updated Vital Signs BP (!) 124/111 (BP Location: Right Arm)   Pulse (!) 113   Temp 100.3 F (37.9 C) (Oral)   Resp (!) 22   Ht 5\' 4"  (1.626 m)   Wt 97.1 kg (214 lb)   LMP 03/05/2018   SpO2 94%   BMI 36.73 kg/m   Physical Exam  Constitutional: She is oriented to person, place, and time. She appears well-developed and well-nourished. She appears distressed.  Anxious and tachycardic  HENT:  Head: Normocephalic and atraumatic.  Mouth/Throat: Oropharynx is clear and moist. No oropharyngeal exudate.  Eyes: Pupils are equal, round, and reactive to light. Conjunctivae and EOM are normal.  Neck: Normal range of motion. Neck supple.  No meningismus.  Cardiovascular: Normal rate, regular rhythm, normal heart sounds and intact distal pulses.  No murmur heard. Pulmonary/Chest: Effort normal and breath sounds normal. No respiratory distress.  Abdominal: Soft. There is no tenderness. There is no rebound and no guarding.  Musculoskeletal: Normal range of motion. She exhibits edema and tenderness.  Lower extremities appear normal to inspection.  Patient complains of severe pain with any kind of palpation.  There is no overlying erythema or edema.  Left leg has warmth involving ankle and lower extremity. she is able to range her bilateral ankles and knees but cries out when she is touched anywhere on her legs.  Intact DP and PT pulses.   No calf asymmetry.  Neurological: She is alert and oriented to person, place, and time. No cranial nerve deficit. She exhibits normal muscle tone. Coordination normal.   5/5 strength throughout. CN 2-12 intact.Equal grip strength.   Skin: Skin is warm.  Psychiatric: She has a normal mood and affect. Her behavior is normal.  Nursing note and vitals reviewed.    ED Treatments / Results  Labs (all labs ordered are listed, but only abnormal results are displayed) Labs Reviewed  CBC WITH DIFFERENTIAL/PLATELET - Abnormal; Notable for the following components:      Result Value   WBC 14.8 (*)    Hemoglobin 9.4 (*)    HCT 30.2 (*)    MCV 77.0 (*)    MCH 24.0 (*)    RDW 17.9 (*)    Platelets 404 (*)    Neutro Abs 12.3 (*)    All other components within normal limits  BASIC METABOLIC PANEL - Abnormal; Notable for the following components:   Potassium 2.5 (*)    Glucose, Bld 100 (*)    Calcium 8.3 (*)    All other components within normal limits  BRAIN NATRIURETIC PEPTIDE - Abnormal; Notable for the following components:   B Natriuretic Peptide 333.0 (*)    All other components within normal limits  D-DIMER, QUANTITATIVE (NOT AT [facility]) - Abnormal; Notable for the following components:   D-Dimer, Quant 1.69 (*)    All other components within normal limits  MAGNESIUM - Abnormal; Notable for the following components:   Magnesium 1.6 (*)    All other components within normal limits  CULTURE, BLOOD (ROUTINE X 2)  CULTURE, BLOOD (ROUTINE X 2)  TROPONIN I  CK  I-STAT CG4 LACTIC ACID, ED    EKG EKG Interpretation  Date/Time:  Tuesday March 07 2018 04:25:14 EDT Ventricular Rate:  96 PR Interval:    QRS Duration: 83 QT Interval:  385 QTC Calculation: 487 R Axis:   60 Text Interpretation:  Sinus rhythm Probable left atrial enlargement Nonspecific T abnormalities, lateral leads Borderline prolonged  QT interval No significant change was found Confirmed by Glynn Octave [phone]) on  03/07/2018 7:42:00 AM   Radiology Dg Chest 2 View  Result Date: 03/07/2018 CLINICAL DATA:  37 year old female with shortness of breath. EXAM: CHEST - 2 VIEW COMPARISON:  Chest CT dated 01/11/2018 FINDINGS: There is cardiomegaly with vascular congestion and probable mild edema. Superimposed pneumonia is not excluded. Clinical correlation is recommended. There is no focal consolidation, pleural effusion, or pneumothorax. Median sternotomy wires. No acute osseous pathology. IMPRESSION: Cardiomegaly with mild vascular congestion. Pneumonia is not excluded. Clinical correlation is recommended. Electronically Signed   By: [name] M.D.   On: 03/07/2018 06:40    Procedures Procedures (including critical care time)  Medications Ordered in ED Medications  morphine 4 MG/ML injection 4 mg (has no administration in time range)  sodium chloride 0.9 % bolus 500 mL (has no administration in time range)     Initial Impression / Assessment and Plan / ED Course  I have reviewed the triage vital signs and the nursing notes.  Pertinent labs & imaging results that were available during my care of the patient were reviewed by me and considered in my medical decision making (see chart for details).    Several hours of bilateral lower extremity cramping.  Intact DP and PT pulses.  Compartments soft and patient able to range all major joints.  We will check electrolytes given her chronic diuretic use. Gentle hydration given.  CK will be obtained.  Hypokalemia of 2.5.  Magnesium slightly low also.  Patient given IV supplementation. Suspect this is the etiology of her leg cramps.  No chest pain or shortness of breath.  D-dimer elevated but DVT considered less likely given bilateral nature of pain and swelling.  Given degree of hypokalemia and ongoing symptoms, plan admission.  Discussed with Dr. Sherryll Burger.  CRITICAL CARE Performed by: Glynn Octave Total critical care time: 32 minutes Critical care  time was exclusive of separately billable procedures and treating other patients. Critical care was necessary to treat or prevent imminent or life-threatening deterioration. Critical care was time spent personally by me on the following activities: development of treatment plan with patient and/or surrogate as well as nursing, discussions with consultants, evaluation of patient's response to treatment, examination of patient, obtaining history from patient or surrogate, ordering and performing treatments and interventions, ordering and review of laboratory studies, ordering and review of radiographic studies, pulse oximetry and re-evaluation of patient's condition.   Final Clinical Impressions(s) / ED Diagnoses   Final diagnoses:  Myalgia  Hypokalemia    ED Discharge Orders    None       Melanni Benway, Jeannett Senior, MD 03/07/18 [phone]

## 2018-03-07 NOTE — H&P (Signed)
History and Physical  Theresa Crawford:[id number] DOB: Apr 21, 1981 DOA: 03/07/2018   PCP: Jerrell Belfast, [name]   Patient coming from: Home  Chief Complaint: leg cramping and sob  HPI:  Theresa Crawford is a 37 y.o. female with medical history of diastolic CHF, hypertension, mitral valve repair 2015, pulmonary hypertension presenting with leg cramping, left greater than right upper began around 11 PM on 03/06/2018.  The patient initially thought that it was due to her sitting [place] style.  However, her leg cramping worsened to the point she had difficulty ambulating.  The patient denies any recent long trips, worsening leg edema, trauma, injury.  In addition, the patient has been complaining of some mild increase in shortness of breath over the past 2 to 3 days.  She felt like she was urinating a copious amount.  As result, she states that she has been drinking more fluid than usual to try to compensate.  She stated that she took her temperature last evening at home and it was 100.4 F.  She has also been complaining of a nonproductive cough for the last 2 to 3 days.  Due to the above constellation of symptoms, the patient presented for further evaluation.  She states that she continues to have her baseline orthopnea type symptoms, but denies any worsening lower extremity edema.  She endorses compliance with her diuretics.  She states that she takes torsemide 20 mg, 1 tablet twice daily.  She no longer takes furosemide.  She states that her last weight was 211 pounds on 03/06/2018.  Notably, the patient recently had an admission to the hospital from 01/11/2018 through 5-19 for acute on chronic diastolic CHF.  She has an appointment with thoracic surgery in mid July. In the emergency department, the patient had a temperature 100.3 F saturating 95-97% on room air.  WBC was 14.8.  Potassium was 2.5 with magnesium 1.6.  Chest x-ray showed vascular congestion.  Assessment/Plan: Acute on chronic diastolic  CHF -I have ordered a dose of furosemide in the emergency department to be continued once the patient arrived on the medical floor -Chest x-ray shows pulmonary edema with exam showing JVD -In part due to her fluid indiscretion as well as her mitral valve stenosis -Consult cardiology -Continue IV furosemide -Daily weights -Strict I's and O's -01/25/2018 TEE--EF 55 to 60%, no WMA, moderate to severe mitral stenosis, moderate TR, mild MR; moderate to severe pulmonary hypertension  Fever/leukocytosis -Blood cultures x2 sets -Viral respiratory panel -Urinalysis and urine culture -Check procalcitonin  Leg pain/cramping -Likely due to hypokalemia and hypomagnesemia -Replete -Venous duplex  Atypical chest pain -Initial troponin negative -Finish cycling troponins -EKG without concerning ischemic changes  Hypokalemia/hypomagnesemia -Replete  Iron deficiency anemia -Baseline hemoglobin 9-10 -Continue ferrous sulfate  Depression -Continue Celexa  Tobacco abuse I have discussed tobacco cessation with the patient.  I have counseled the patient regarding the negative impacts of continued tobacco use including but not limited to lung cancer, COPD, and cardiovascular disease.  I have discussed alternatives to tobacco and modalities that may help facilitate tobacco cessation including but not limited to biofeedback, hypnosis, and medications.  Total time spent with tobacco counseling was 4 minutes.        Past Medical History:  Diagnosis Date  . Chronic diastolic CHF (congestive heart failure) (HCC)   . History of open heart surgery   . Hypertension   . Mitral valve disease    mitral regurgitation  . Normocytic anemia   .  Obesity   . Premature atrial contractions   . Pulmonary hypertension (HCC) 06/12/2017  . PVC's (premature ventricular contractions)    a. h/o palpitations with event monitor in 03/2017 showing NSR with PACs/PVCs.   Past Surgical History:  Procedure Laterality  Date  . ABDOMINAL HYSTERECTOMY    . CESAREAN SECTION     X's 2  . MITRAL VALVE REPAIR  03/2013    [facility] [place]   . PARTIAL HYSTERECTOMY  2011   PID w/problem with fallopian tubes  . TEE WITHOUT CARDIOVERSION N/A 05/26/2016   Procedure: TRANSESOPHAGEAL ECHOCARDIOGRAM (TEE);  Surgeon: Laqueta LindenSuresh A Koneswaran, MD;  Location: [facility];  Service: Cardiovascular;  Laterality: N/A;  . TEE WITHOUT CARDIOVERSION N/A 01/25/2018   Procedure: TRANSESOPHAGEAL ECHOCARDIOGRAM (TEE) WITH PROPOFOL;  Surgeon: Jonelle SidleMcDowell, Samuel G, MD;  Location: [facility];  Service: Cardiovascular;  Laterality: N/A;   Social History:  reports that she has been smoking cigarettes.  She started smoking about 18 years ago. She has been smoking about 0.25 packs per day. She has never used smokeless tobacco. She reports that she does not drink alcohol or use drugs.   Family History  Problem Relation Age of Onset  . Heart failure Father        just received LVAD  . Heart disease Paternal Grandfather        stent placement     Allergies  Allergen Reactions  . Lisinopril Shortness Of Breath and Swelling    Throat swelling  . Penicillins Shortness Of Breath and Swelling    Has patient had a PCN reaction causing immediate rash, facial/tongue/throat swelling, SOB or lightheadedness with hypotension: Yes Has patient had a PCN reaction causing severe rash involving mucus membranes or skin necrosis: Yes Has patient had a PCN reaction that required hospitalization No Has patient had a PCN reaction occurring within the last 10 years: No If all of the above answers are "NO", then may proceed with Cephalosporin use.   Marland Kitchen. Perflutren Lipid Microsphere Shortness Of Breath    Other reaction(s): Chest Pain/Tightness  . Bee Venom   . Contrast Media [Iodinated Diagnostic Agents] Hives and Itching    Itching and hives to throat, neck, face and arms.   No difficulty breathing.   This was given at [facility] 2015. Contrast Dye  . Shellfish Allergy      Prior to Admission medications   Medication Sig Start Date End Date Taking? Authorizing Provider  albuterol (PROVENTIL HFA;VENTOLIN HFA) 108 (90 Base) MCG/ACT inhaler Inhale 1-2 puffs into the lungs every 6 (six) hours as needed for wheezing or shortness of breath.    [provider]  citalopram (CELEXA) 40 MG tablet Take 40 mg by mouth at bedtime.     [provider]  cyclobenzaprine (FLEXERIL) 10 MG tablet Take 1 tablet (10 mg total) by mouth 3 (three) times daily. 03/04/18   Ivery QualeBryant, Hobson, PA-C  diclofenac (VOLTAREN) 75 MG EC tablet Take 1 tablet (75 mg total) by mouth 2 (two) times daily. 03/04/18   Ivery QualeBryant, Hobson, PA-C  docusate sodium (COLACE) 100 MG capsule Take 100 mg by mouth daily.    [provider]  EPINEPHrine (EPIPEN 2-PAK) 0.3 mg/0.3 mL IJ SOAJ injection Inject 0.3 Units into the muscle once as needed.    [provider]  ferrous sulfate 325 (65 FE) MG tablet Take 325 mg by mouth 2 (two) times daily with a meal.    [provider]  furosemide (LASIX) 80 MG tablet TAKE 1 TABLET BY MOUTH EVERY DAY 02/09/18   Antoine Poche, MD  metoprolol succinate (TOPROL-XL) 25 MG 24 hr tablet TAKE 1 TABLET BY MOUTH DAILY 02/09/18   Antoine Poche, MD  potassium chloride SA (K-DUR,KLOR-CON) 20 MEQ tablet Take 2 tablets (40 mEq total) by mouth daily. 01/12/18 02/11/18  Erick Blinks, MD  Probiotic Product (PROBIOTIC DAILY PO) Take 1 capsule by mouth daily.    [provider]  QUEtiapine (SEROQUEL) 25 MG tablet Take 25 mg by mouth at bedtime.     [provider]  torsemide (DEMADEX) 20 MG tablet Take 1 tab po daily, take 1 extra tablet daily for weight gain of more than 3 lbs in a day 01/12/18   Erick Blinks, MD    Review of Systems:  Constitutional:  No weight loss, night sweats, Fevers, chills, fatigue.  Head&Eyes: No headache.  No vision loss.  No eye  pain or scotoma ENT:  No Difficulty swallowing,Tooth/dental problems,Sore throat,  No ear ache, post nasal drip,  Cardio-vascular:  No chest pain, Orthopnea, PND, swelling in lower extremities,  dizziness, palpitations  GI:  No  abdominal pain, nausea, vomiting, diarrhea, loss of appetite, hematochezia, melena, heartburn, indigestion, Resp:  . No coughing up of blood .No wheezing.No chest wall deformity  Skin:  no rash or lesions.  GU:  no dysuria, change in color of urine, no urgency or frequency. No flank pain.  Musculoskeletal:  No joint pain or swelling. No decreased range of motion. No back pain.  Psych:  No change in mood or affect. No depression or anxiety. Neurologic: No headache, no dysesthesia, no focal weakness, no vision loss. No syncope  Physical Exam: Vitals:   03/07/18 0303 03/07/18 0306  BP:  (!) 124/111  Pulse:  (!) 113  Resp:  (!) 22  Temp:  100.3 F (37.9 C)  TempSrc:  Oral  SpO2:  94%  Weight: 97.1 kg (214 lb)   Height: 5\' 4"  (1.626 m)    General:  A&O x 3, NAD, nontoxic, pleasant/cooperative Head/Eye: No conjunctival hemorrhage, no icterus, [place]/AT, No nystagmus ENT:  No icterus,  No thrush, good dentition, no pharyngeal exudate Neck:  No masses, no lymphadenpathy, no bruits CV:  RRR, no rub, no gallop, no S3 Lung:  Bilateral crackles, no wheeze Abdomen: soft/NT, +BS, nondistended, no peritoneal signs Ext: No cyanosis, No rashes, No petechiae, No lymphangitis, No edema Neuro: CNII-XII intact, strength 4/5 in bilateral upper and lower extremities, no dysmetria  Labs on Admission:  Basic Metabolic Panel: Recent Labs  Lab 03/07/18 0450  NA 138  K 2.5*  CL 101  CO2 28  GLUCOSE 100*  BUN 13  CREATININE 0.81  CALCIUM 8.3*  MG 1.6*   Liver Function Tests: No results for input(s): AST, ALT, ALKPHOS, BILITOT, PROT, ALBUMIN in the last 168 hours. No results for input(s): LIPASE, AMYLASE in the last 168 hours. No results for input(s): AMMONIA in  the last 168 hours. CBC: Recent Labs  Lab 03/07/18 0450  WBC 14.8*  NEUTROABS 12.3*  HGB 9.4*  HCT 30.2*  MCV 77.0*  PLT 404*   Coagulation Profile: No results for input(s): INR, PROTIME in the last 168 hours. Cardiac Enzymes: Recent Labs  Lab 03/07/18 0450  CKTOTAL 52  TROPONINI <0.03   BNP: Invalid input(s): POCBNP CBG: No results for input(s): GLUCAP in the last 168 hours. Urine analysis:    Component Value Date/Time   COLORURINE YELLOW 05/23/2016 2027  APPEARANCEUR CLEAR 05/23/2016 2027   LABSPEC <1.005 (L) 05/23/2016 2027   PHURINE 6.5 05/23/2016 2027   GLUCOSEU NEGATIVE 05/23/2016 2027   HGBUR NEGATIVE 05/23/2016 2027   BILIRUBINUR NEGATIVE 05/23/2016 2027   KETONESUR NEGATIVE 05/23/2016 2027   PROTEINUR NEGATIVE 05/23/2016 2027   NITRITE NEGATIVE 05/23/2016 2027   LEUKOCYTESUR TRACE (A) 05/23/2016 2027   Sepsis Labs: @LABRCNTIP (procalcitonin:4,lacticidven:4) ) Recent Results (from the past 240 hour(s))  Blood culture (routine x 2)     Status: None (Preliminary result)   Collection Time: 03/07/18  5:33 AM  Result Value Ref Range Status   Specimen Description BLOOD LEFT ARM  Final   Special Requests   Final    BOTTLES DRAWN AEROBIC AND ANAEROBIC Blood Culture adequate volume Performed at [facility], [address], [place], [place] [address]    Culture PENDING  Incomplete   Report Status PENDING  Incomplete  Blood culture (routine x 2)     Status: None (Preliminary result)   Collection Time: 03/07/18  5:40 AM  Result Value Ref Range Status   Specimen Description BLOOD RIGHT HAND  Final   Special Requests   Final    BOTTLES DRAWN AEROBIC AND ANAEROBIC Blood Culture adequate volume Performed at [facility], [address] [place], [place] [address]    Culture PENDING  Incomplete   Report Status PENDING  Incomplete     Radiological Exams on Admission: Dg Chest 2 View  Result Date: 03/07/2018 CLINICAL DATA:  37 year old female with shortness of  breath. EXAM: CHEST - 2 VIEW COMPARISON:  Chest CT dated 01/11/2018 FINDINGS: There is cardiomegaly with vascular congestion and probable mild edema. Superimposed pneumonia is not excluded. Clinical correlation is recommended. There is no focal consolidation, pleural effusion, or pneumothorax. Median sternotomy wires. No acute osseous pathology. IMPRESSION: Cardiomegaly with mild vascular congestion. Pneumonia is not excluded. Clinical correlation is recommended. Electronically Signed   By: [name] M.D.   On: 03/07/2018 06:40    EKG: Independently reviewed. Sinus, nonspecific T wave change    Time spent:60 minutes Code Status:   FULL Family Communication:  No Family at bedside Disposition Plan: expect 2-3 day hospitalization Consults called: cardiology DVT Prophylaxis: [place] Lovenox  Catarina Hartshorn, DO  [facility] Pager [phone]  If 7PM-7AM, please contact night-coverage [email or web address] Password [facility] 03/07/2018, 7:41 AM

## 2018-03-07 NOTE — ED Notes (Signed)
CRITICAL VALUE ALERT  Critical Value:  Potassium 2.5 Date & Time Notied: 03/07/18 @0546  Provider Notified: Rancour Orders Received/Actions taken:None yet

## 2018-03-08 ENCOUNTER — Other Ambulatory Visit: Payer: Self-pay | Admitting: Student

## 2018-03-08 DIAGNOSIS — I342 Nonrheumatic mitral (valve) stenosis: Secondary | ICD-10-CM

## 2018-03-08 DIAGNOSIS — Z79899 Other long term (current) drug therapy: Secondary | ICD-10-CM

## 2018-03-08 DIAGNOSIS — E876 Hypokalemia: Secondary | ICD-10-CM

## 2018-03-08 LAB — URINE CULTURE

## 2018-03-08 LAB — BASIC METABOLIC PANEL
ANION GAP: 6 (ref 5–15)
BUN: 16 mg/dL (ref 6–20)
CALCIUM: 8.5 mg/dL — AB (ref 8.9–10.3)
CO2: 28 mmol/L (ref 22–32)
Chloride: 106 mmol/L (ref 98–111)
Creatinine, Ser: 0.76 mg/dL (ref 0.44–1.00)
Glucose, Bld: 135 mg/dL — ABNORMAL HIGH (ref 70–99)
Potassium: 3.3 mmol/L — ABNORMAL LOW (ref 3.5–5.1)
Sodium: 140 mmol/L (ref 135–145)

## 2018-03-08 LAB — MAGNESIUM: MAGNESIUM: 2.2 mg/dL (ref 1.7–2.4)

## 2018-03-08 MED ORDER — FERROUS SULFATE 325 (65 FE) MG PO TABS: 325.0000 mg | ORAL_TABLET | Freq: Two times a day (BID) | ORAL | 3 refills | Status: DC

## 2018-03-08 NOTE — Progress Notes (Signed)
Patient IV and telemetry removed, tolerated well. Patient given discharge instructions at bedside.

## 2018-03-08 NOTE — Discharge Summary (Signed)
Physician Discharge Summary  Theresa Crawford WUJ:[id number] DOB: 07/22/1981 DOA: 03/07/2018  PCP: Jerrell Belfast, [name]  Admit date: 03/07/2018 Discharge date: 03/08/2018  Time spent: 45 minutes  Recommendations for Outpatient Follow-up:  -Will be discharged home today. -Has follow-up scheduled with cardiology and with thoracic surgery for discussion of mitral valve repair.  Discharge Diagnoses:  Active Problems:   Acute on chronic diastolic CHF (congestive heart failure) (HCC)   Mitral valve stenosis   Hypokalemia   Pulmonary hypertension (HCC)   Prolonged QT interval   Hypomagnesemia   Tobacco abuse   Discharge Condition: Stable and improved  Filed Weights   03/07/18 0303 03/07/18 1400  Weight: 97.1 kg (214 lb) 97.1 kg (214 lb)    History of present illness:  As per Dr. Arbutus Leas on 6/25: Theresa Crawford is a 37 y.o. female with medical history of diastolic CHF, hypertension, mitral valve repair 2015, pulmonary hypertension presenting with leg cramping, left greater than right upper began around 11 PM on 03/06/2018.  The patient initially thought that it was due to her sitting [place] style.  However, her leg cramping worsened to the point she had difficulty ambulating.  The patient denies any recent long trips, worsening leg edema, trauma, injury.  In addition, the patient has been complaining of some mild increase in shortness of breath over the past 2 to 3 days.  She felt like she was urinating a copious amount.  As result, she states that she has been drinking more fluid than usual to try to compensate.  She stated that she took her temperature last evening at home and it was 100.4 F.  She has also been complaining of a nonproductive cough for the last 2 to 3 days.  Due to the above constellation of symptoms, the patient presented for further evaluation.  She states that she continues to have her baseline orthopnea type symptoms, but denies any worsening lower extremity edema.  She  endorses compliance with her diuretics.  She states that she takes torsemide 20 mg, 1 tablet twice daily.  She no longer takes furosemide.  She states that her last weight was 211 pounds on 03/06/2018.  Notably, the patient recently had an admission to the hospital from 01/11/2018 through 5-19 for acute on chronic diastolic CHF.  She has an appointment with thoracic surgery in mid July. In the emergency department, the patient had a temperature 100.3 F saturating 95-97% on room air.  WBC was 14.8.  Potassium was 2.5 with magnesium 1.6.  Chest x-ray showed vascular congestion.     Hospital Course:   Acute on chronic diastolic heart failure -Complicated by post mitral valve repair stenosis and pulmonary hypertension -She is -1.3 L since admission and has had a downtrending creatinine with diuresis which is consistent with venous congestion and congestive heart failure. -Per cardiology, ok to resume home torsemide dosing and ok to DC home. -They have scheduled OP appointment and she also has a follow up with CVTS in July for consideration of repair of her MV. -She appears euvolemic on exam.  Hypokalemia -Due to diuresis. -Potassium is 3.3 on day of discharge, she was given an extra 80 mEq today, to continue her home dose of 40 mEq daily.    Procedures:  None  Consultations:  Cardiology  Discharge Instructions  Discharge Instructions    Diet - low sodium heart healthy   Complete by:  As directed    Increase activity slowly   Complete by:  As directed      Allergies as of 03/08/2018      Reactions   Lisinopril Shortness Of Breath, Swelling   Throat swelling   Penicillins Shortness Of Breath, Swelling   Has patient had a PCN reaction causing immediate rash, facial/tongue/throat swelling, SOB or lightheadedness with hypotension: Yes Has patient had a PCN reaction causing severe rash involving mucus membranes or skin necrosis: Yes Has patient had a PCN reaction that required  hospitalization No Has patient had a PCN reaction occurring within the last 10 years: No If all of the above answers are "NO", then may proceed with Cephalosporin use.   Perflutren Lipid Microsphere Shortness Of Breath   Other reaction(s): Chest Pain/Tightness   Bee Venom    Contrast Media [iodinated Diagnostic Agents] Hives, Itching   Itching and hives to throat, neck, face and arms.   No difficulty breathing.   This was given at [facility] approximately 2015. Contrast Dye   Shellfish Allergy       Medication List    TAKE these medications   bumetanide 2 MG tablet Commonly known as:  BUMEX Take 2 mg by mouth daily.   cyclobenzaprine 10 MG tablet Commonly known as:  FLEXERIL Take 1 tablet (10 mg total) by mouth 3 (three) times daily.   EPIPEN 2-PAK 0.3 mg/0.3 mL Soaj injection Generic drug:  EPINEPHrine Inject 0.3 Units into the muscle once as needed.   ferrous sulfate 325 (65 FE) MG tablet Take 1 tablet (325 mg total) by mouth 2 (two) times daily with a meal.   furosemide 80 MG tablet Commonly known as:  LASIX TAKE 1 TABLET BY MOUTH EVERY DAY   metoprolol succinate 25 MG 24 hr tablet Commonly known as:  TOPROL-XL TAKE 1 TABLET BY MOUTH DAILY   potassium chloride SA 20 MEQ tablet Commonly known as:  K-DUR,KLOR-CON Take 2 tablets (40 mEq total) by mouth daily.      Allergies  Allergen Reactions  . Lisinopril Shortness Of Breath and Swelling    Throat swelling  . Penicillins Shortness Of Breath and Swelling    Has patient had a PCN reaction causing immediate rash, facial/tongue/throat swelling, SOB or lightheadedness with hypotension: Yes Has patient had a PCN reaction causing severe rash involving mucus membranes or skin necrosis: Yes Has patient had a PCN reaction that required hospitalization No Has patient had a PCN reaction occurring within the last 10 years: No If all of the above answers are "NO", then may proceed with Cephalosporin use.   Marland Kitchen.  Perflutren Lipid Microsphere Shortness Of Breath    Other reaction(s): Chest Pain/Tightness  . Bee Venom   . Contrast Media [Iodinated Diagnostic Agents] Hives and Itching    Itching and hives to throat, neck, face and arms.   No difficulty breathing.   This was given at [facility] approximately 2015. Contrast Dye  . Shellfish Allergy    Follow-up Information    Laqueta LindenKoneswaran, Suresh A, MD Follow up.   Specialty:  Cardiology Why:  Will need to obtain repeat blood work at [facility] on 03/15/2018. Contact information: [address] [place] [address] [phone]        Ellsworth LennoxStrader, Brittany M, PA-C Follow up on 04/07/2018.   Specialties:  Physician Assistant, Cardiology Why:  Cardiology Hospital Follow-Up on 04/07/2018 at 3:30PM.  Contact information: [address] [place] [address] [phone]            The results of significant diagnostics from  this hospitalization (including imaging, microbiology, ancillary and laboratory) are listed below for reference.    Significant Diagnostic Studies: Dg Chest 2 View  Result Date: 03/07/2018 CLINICAL DATA:  37 year old female with shortness of breath. EXAM: CHEST - 2 VIEW COMPARISON:  Chest CT dated 01/11/2018 FINDINGS: There is cardiomegaly with vascular congestion and probable mild edema. Superimposed pneumonia is not excluded. Clinical correlation is recommended. There is no focal consolidation, pleural effusion, or pneumothorax. Median sternotomy wires. No acute osseous pathology. IMPRESSION: Cardiomegaly with mild vascular congestion. Pneumonia is not excluded. Clinical correlation is recommended. Electronically Signed   By: [name] M.D.   On: 03/07/2018 06:40   US Venous Img Lower Bilateral  Result Date: 03/07/2018 CLINICAL DATA:  37 year old female with a history of bilateral leg pain EXAM: BILATERAL LOWER EXTREMITY VENOUS DOPPLER ULTRASOUND TECHNIQUE: Gray-scale sonography with graded compression, as well  as color Doppler and duplex ultrasound were performed to evaluate the lower extremity deep venous systems from the level of the common femoral vein and including the common femoral, femoral, profunda femoral, popliteal and calf veins including the posterior tibial, peroneal and gastrocnemius veins when visible. The superficial great saphenous vein was also interrogated. Spectral Doppler was utilized to evaluate flow at rest and with distal augmentation maneuvers in the common femoral, femoral and popliteal veins. COMPARISON:  None. FINDINGS: RIGHT LOWER EXTREMITY Common Femoral Vein: No evidence of thrombus. Normal compressibility, respiratory phasicity and response to augmentation. Saphenofemoral Junction: No evidence of thrombus. Normal compressibility and flow on color Doppler imaging. Profunda Femoral Vein: No evidence of thrombus. Normal compressibility and flow on color Doppler imaging. Femoral Vein: No evidence of thrombus. Normal compressibility, respiratory phasicity and response to augmentation. Popliteal Vein: No evidence of thrombus. Normal compressibility, respiratory phasicity and response to augmentation. Calf Veins: No evidence of thrombus. Normal compressibility and flow on color Doppler imaging. Superficial Great Saphenous Vein: No evidence of thrombus. Normal compressibility and flow on color Doppler imaging. Other Findings:  None. LEFT LOWER EXTREMITY Common Femoral Vein: No evidence of thrombus. Normal compressibility, respiratory phasicity and response to augmentation. Saphenofemoral Junction: No evidence of thrombus. Normal compressibility and flow on color Doppler imaging. Profunda Femoral Vein: No evidence of thrombus. Normal compressibility and flow on color Doppler imaging. Femoral Vein: No evidence of thrombus. Normal compressibility, respiratory phasicity and response to augmentation. Popliteal Vein: No evidence of thrombus. Normal compressibility, respiratory phasicity and response to  augmentation. Calf Veins: No evidence of thrombus. Normal compressibility and flow on color Doppler imaging. Superficial Great Saphenous Vein: No evidence of thrombus. Normal compressibility and flow on color Doppler imaging. Other Findings:  None. IMPRESSION: Sonographic survey of the bilateral lower extremities negative for DVT Electronically Signed   By: Gilmer Mor D.O.   On: 03/07/2018 10:35    Microbiology: Recent Results (from the past 240 hour(s))  Blood culture (routine x 2)     Status: None (Preliminary result)   Collection Time: 03/07/18  5:33 AM  Result Value Ref Range Status   Specimen Description BLOOD LEFT ARM  Final   Special Requests   Final    BOTTLES DRAWN AEROBIC AND ANAEROBIC Blood Culture adequate volume   Culture   Final    NO GROWTH 1 DAY Performed at [facility], [address] [place], [place] [address]    Report Status PENDING  Incomplete  Blood culture (routine x 2)     Status: None (Preliminary result)   Collection Time: 03/07/18  5:40 AM  Result Value Ref Range  Status   Specimen Description BLOOD RIGHT HAND  Final   Special Requests   Final    BOTTLES DRAWN AEROBIC AND ANAEROBIC Blood Culture adequate volume   Culture   Final    NO GROWTH 1 DAY Performed at [facility], [address] [place], [place] [address]    Report Status PENDING  Incomplete  Culture, Urine     Status: Abnormal   Collection Time: 03/07/18 11:06 AM  Result Value Ref Range Status   Specimen Description   Final    URINE, CLEAN CATCH Performed at [facility], [address] [place], [place] [address]    Special Requests   Final    NONE Performed at [facility], [address] [place], [place] [address]    Culture MULTIPLE SPECIES PRESENT, SUGGEST RECOLLECTION (A)  Final   Report Status 03/08/2018 FINAL  Final  Respiratory Panel by PCR     Status: None   Collection Time: 03/07/18 11:06 AM  Result Value Ref Range Status   Adenovirus NOT DETECTED NOT DETECTED Final    Coronavirus 229E NOT DETECTED NOT DETECTED Final   Coronavirus HKU1 NOT DETECTED NOT DETECTED Final   Coronavirus NL63 NOT DETECTED NOT DETECTED Final   Coronavirus OC43 NOT DETECTED NOT DETECTED Final   Metapneumovirus NOT DETECTED NOT DETECTED Final   Rhinovirus / Enterovirus NOT DETECTED NOT DETECTED Final   Influenza A NOT DETECTED NOT DETECTED Final   Influenza B NOT DETECTED NOT DETECTED Final   Parainfluenza Virus 1 NOT DETECTED NOT DETECTED Final   Parainfluenza Virus 2 NOT DETECTED NOT DETECTED Final   Parainfluenza Virus 3 NOT DETECTED NOT DETECTED Final   Parainfluenza Virus 4 NOT DETECTED NOT DETECTED Final   Respiratory Syncytial Virus NOT DETECTED NOT DETECTED Final   Bordetella pertussis NOT DETECTED NOT DETECTED Final   Chlamydophila pneumoniae NOT DETECTED NOT DETECTED Final   Mycoplasma pneumoniae NOT DETECTED NOT DETECTED Final    Comment: Performed at [facility], [address] [place], [place] [address]     Labs: Basic Metabolic Panel: Recent Labs  Lab 03/07/18 0450 03/08/18 0430  NA 138 140  K 2.5* 3.3*  CL 101 106  CO2 28 28  GLUCOSE 100* 135*  BUN 13 16  CREATININE 0.81 0.76  CALCIUM 8.3* 8.5*  MG 1.6* 2.2   Liver Function Tests: No results for input(s): AST, ALT, ALKPHOS, BILITOT, PROT, ALBUMIN in the last 168 hours. No results for input(s): LIPASE, AMYLASE in the last 168 hours. No results for input(s): AMMONIA in the last 168 hours. CBC: Recent Labs  Lab 03/07/18 0450  WBC 14.8*  NEUTROABS 12.3*  HGB 9.4*  HCT 30.2*  MCV 77.0*  PLT 404*   Cardiac Enzymes: Recent Labs  Lab 03/07/18 0450 03/07/18 0903 03/07/18 1422 03/07/18 2024  CKTOTAL 52  --   --   --   TROPONINI <0.03 <0.03 <0.03 <0.03   BNP: BNP (last 3 results) Recent Labs    01/06/18 1006 01/11/18 0420 03/07/18 0450  BNP 314.0* 383.0* 333.0*    ProBNP (last 3 results) No results for input(s): PROBNP in the last 8760 hours.  CBG: No results for input(s):  GLUCAP in the last 168 hours.     Signed:  Chaya Jan  [facility] Pager: [phone] 03/08/2018, 4:41 PM

## 2018-03-08 NOTE — Consult Note (Addendum)
Cardiology Consult    Patient ID: Theresa Crawford; [id number]; Jan 21, 1981   Admit date: 03/07/2018 Date of Consult: 03/08/2018  Primary Care Provider: Jerrell Belfast, [name] Primary Cardiologist: Prentice Docker, MD   Patient Profile    Theresa Crawford is a 37 y.o. female with past medical history of MVR (in 2014), chronic diastolic CHF, HTN, and pulmonary HTN who is being seen today for the evaluation of CHF and mitral stenosis at the request of Dr. Arbutus Leas.   History of Present Illness    Ms. Doffing last admitted to [facility] from 01/11/2018 to 01/12/2018 for evaluation of worsening dyspnea and orthopnea.  BNP was found to be elevated to 383 and d-dimer was at 2.36.  CTA was negative for PE but did show diffuse opacities in the lungs bilaterally most consistent with edema or less likely inflammation. She was followed by Cardiology during admission and diuresed well with IV Lasix 40 mg twice daily. Was transitioned to Toresemide 20mg  daily at the time of discharge.  It was recommended she undergo a TEE as an outpatient for further evaluation of her mitral valve. This was performed on 01/25/2018 and showed a preserved EF of 55% to 60%, no regional wall motion abnormalities, moderate to severe mitral stenosis and mild MR. Was also noted to have moderate TR. It was recommended she be referred to CT surgery and an appointment was scheduled for 03/20/2018 with Dr. Cornelius Moras.  She presented back to [facility] ED on 03/07/2018 for evaluation of bilateral leg pain that started earlier that day. She reports overall she had been in her usual state of health prior to the day of admission.  She does have an occasional dry cough at times but denies any specific dyspnea on exertion orthopnea.  No recent chest pain, palpitations, or lower extremity edema. The day of admission, she had started to experience cramps along her lower extremities bilaterally which she says was most intense pain she has ever experienced.  Initial  labs showed WBC 14.8, Hgb 9.4, platelets 404, Na+ 138, K+ 2.5, and creatinine 0.81. BNP at 333 (consistent with prior values). D-dimer 1.69. Mg 1.6. Initial and cyclic troponin values have been negative. Blood cultures negative thus far. CXR shows cardiomegaly with mild vascular congestion.  Lower extremity dopplers negative for DVT. EKG shows normal sinus rhythm, heart rate 96, with no acute ST or T wave changes when compared to prior tracings. QTc 487 ms.    She has been admitted and started on IV Lasix 40 mg twice daily.  Is overall -1.3 L with weight stable at 214 lbs. Has received K+ and Mg supplementation with K+ at 3.3 and Mg at 2.2.   Past Medical History:  Diagnosis Date  . Chronic diastolic CHF (congestive heart failure) (HCC)   . History of open heart surgery   . Hypertension   . Mitral valve disease    mitral regurgitation  . Normocytic anemia   . Obesity   . Premature atrial contractions   . Pulmonary hypertension (HCC) 06/12/2017  . PVC's (premature ventricular contractions)    a. h/o palpitations with event monitor in 03/2017 showing NSR with PACs/PVCs.    Past Surgical History:  Procedure Laterality Date  . ABDOMINAL HYSTERECTOMY    . CESAREAN SECTION     X's 2  . MITRAL VALVE REPAIR  03/2013    [facility] in [place], [place]   . PARTIAL HYSTERECTOMY  2011   PID w/problem with fallopian tubes  .  TEE WITHOUT CARDIOVERSION N/A 05/26/2016   Procedure: TRANSESOPHAGEAL ECHOCARDIOGRAM (TEE);  Surgeon: [name], MD;  Location: [facility];  Service: Cardiovascular;  Laterality: N/A;  . TEE WITHOUT CARDIOVERSION N/A 01/25/2018   Procedure: TRANSESOPHAGEAL ECHOCARDIOGRAM (TEE) WITH PROPOFOL;  Surgeon: Jonelle Sidle, MD;  Location: [facility];  Service: Cardiovascular;  Laterality: N/A;     Home Medications:  Prior to Admission medications   Medication Sig Start Date End Date Taking? Authorizing Provider  bumetanide (BUMEX) 2 MG tablet Take 2  mg by mouth daily.   Yes [provider]  cyclobenzaprine (FLEXERIL) 10 MG tablet Take 1 tablet (10 mg total) by mouth 3 (three) times daily. 03/04/18  Yes Ivery Quale, PA-C  furosemide (LASIX) 80 MG tablet TAKE 1 TABLET BY MOUTH EVERY DAY 02/09/18  Yes Branch, Dorothe Pea, MD  metoprolol succinate (TOPROL-XL) 25 MG 24 hr tablet TAKE 1 TABLET BY MOUTH DAILY 02/09/18  Yes Branch, Dorothe Pea, MD  potassium chloride SA (K-DUR,KLOR-CON) 20 MEQ tablet Take 2 tablets (40 mEq total) by mouth daily. 01/12/18 03/07/18 Yes Erick Blinks, MD  EPINEPHrine (EPIPEN 2-PAK) 0.3 mg/0.3 mL IJ SOAJ injection Inject 0.3 Units into the muscle once as needed.    [provider]    Inpatient Medications: Scheduled Meds: . docusate sodium  100 mg Oral Daily  . enoxaparin (LOVENOX) injection  40 mg Subcutaneous Q24H  . ferrous sulfate  325 mg Oral BID WC  . furosemide  40 mg Intravenous BID  . metoprolol succinate  25 mg Oral Daily  . potassium chloride SA  40 mEq Oral BID  . sodium chloride flush  3 mL Intravenous Q12H   Continuous Infusions: . sodium chloride     PRN Meds: sodium chloride, acetaminophen, ondansetron (ZOFRAN) IV, sodium chloride flush  Allergies:    Allergies  Allergen Reactions  . Lisinopril Shortness Of Breath and Swelling    Throat swelling  . Penicillins Shortness Of Breath and Swelling    Has patient had a PCN reaction causing immediate rash, facial/tongue/throat swelling, SOB or lightheadedness with hypotension: Yes Has patient had a PCN reaction causing severe rash involving mucus membranes or skin necrosis: Yes Has patient had a PCN reaction that required hospitalization No Has patient had a PCN reaction occurring within the last 10 years: No If all of the above answers are "NO", then may proceed with Cephalosporin use.   Marland Kitchen Perflutren Lipid Microsphere Shortness Of Breath    Other reaction(s): Chest Pain/Tightness  . Bee Venom   . Contrast Media [Iodinated  Diagnostic Agents] Hives and Itching    Itching and hives to throat, neck, face and arms.   No difficulty breathing.   This was given at [facility] approximately 2015. Contrast Dye  . Shellfish Allergy     Social History:   Social History   Socioeconomic History  . Marital status: Single    Spouse name: Not on file  . Number of children: Not on file  . Years of education: Not on file  . Highest education level: Not on file  Occupational History  . Not on file  Social Needs  . Financial resource strain: Not on file  . Food insecurity:    Worry: Not on file    Inability: Not on file  . Transportation needs:    Medical: Not on file    Non-medical: Not on file  Tobacco Use  . Smoking status: Current Some Day Smoker    Packs/day: 0.25  Types: Cigarettes    Start date: 11/03/1999  . Smokeless tobacco: Never Used  Substance and Sexual Activity  . Alcohol use: No    Alcohol/week: 0.0 oz  . Drug use: No  . Sexual activity: Not on file  Lifestyle  . Physical activity:    Days per week: Not on file    Minutes per session: Not on file  . Stress: Not on file  Relationships  . Social connections:    Talks on phone: Not on file    Gets together: Not on file    Attends religious service: Not on file    Active member of club or organization: Not on file    Attends meetings of clubs or organizations: Not on file    Relationship status: Not on file  . Intimate partner violence:    Fear of current or ex partner: Not on file    Emotionally abused: Not on file    Physically abused: Not on file    Forced sexual activity: Not on file  Other Topics Concern  . Not on file  Social History Narrative  . Not on file     Family History:    Family History  Problem Relation Age of Onset  . Heart failure Father        just received LVAD  . Heart disease Paternal Grandfather        stent placement      Review of Systems    General:  No chills, fever, night sweats  or weight changes. Positive for leg cramps.  Cardiovascular:  No chest pain, edema, orthopnea, palpitations, paroxysmal nocturnal dyspnea. Positive for dyspnea.  Dermatological: No rash, lesions/masses Respiratory: No cough, dyspnea Urologic: No hematuria, dysuria Abdominal:   No nausea, vomiting, diarrhea, bright red blood per rectum, melena, or hematemesis Neurologic:  No visual changes, wkns, changes in mental status. All other systems reviewed and are otherwise negative except as noted above.  Physical Exam/Data    Vitals:   03/07/18 0850 03/07/18 1400 03/07/18 2310 03/08/18 0451  BP: (!) 105/58  (!) 119/56 107/85  Pulse: 84  66 69  Resp: 20  20 20   Temp: 98.7 F (37.1 C)  97.6 F (36.4 C) 97.6 F (36.4 C)  TempSrc:   Oral Oral  SpO2: 95%  98% 98%  Weight:  214 lb (97.1 kg)    Height:  5\' 4"  (1.626 m)      Intake/Output Summary (Last 24 hours) at 03/08/2018 1048 Last data filed at 03/08/2018 0400 Gross per 24 hour  Intake 0 ml  Output 1000 ml  Net -1000 ml   Filed Weights   03/07/18 0303 03/07/18 1400  Weight: 214 lb (97.1 kg) 214 lb (97.1 kg)   Body mass index is 36.73 kg/m.   General: Pleasant, African American female appearing in NAD Psych: Normal affect. Neuro: Alert and oriented X 3. Moves all extremities spontaneously. HEENT: Normal  Neck: Supple without bruits or JVD. Lungs:  Resp regular and unlabored, CTA without wheezing or rales. Heart: RRR no s3, s4, diastolic murmur along Apex.  Abdomen: Soft, non-tender, non-distended, BS + x 4.  Extremities: No clubbing, cyanosis or lower extremity edema. DP/PT/Radials 2+ and equal bilaterally.   EKG:  The EKG was personally reviewed and demonstrates: NSR, HR 96, with no acute ST or T wave changes when compared to prior tracings.  Labs/Studies     Relevant CV Studies:  Transesophageal Echocardiogram: 01/25/2018 Study Conclusions  - Left ventricle: Systolic function  was normal. The estimated   ejection  fraction was in the range of 55% to 60%. Wall motion was   normal; there were no regional wall motion abnormalities. - Aorta: Mild atheroschlerotic plaque noted within the aortic arch. - Mitral valve: Moderately thickened leaflets with mild tethering   of the anterior leaflet and restricted motion of the posterior   leaflet. Chordae appear thickened and shortened as well. Annular   ring present. The findings are consistent with moderate to severe   stenosis. There was mild regurgitation directed eccentrically.   Mean gradient (D): 9 mm Hg. Planimetered valve area: 1.39 cm^2. - Left atrium: The atrium was dilated. There was moderatecontinuous   spontaneous echo contrast (&quot;smoke&quot;) in the appendage. Emptying   velocity was severely reduced. - Right ventricle: The cavity size was mildly dilated. - Right atrium: The atrium was dilated. - Atrial septum: No defect or patent foramen ovale was identified.   Few isolated bubbles noted in left atrium after 4-5 beats. - Tricuspid valve: There was moderate regurgitation. Peak RV-RA   gradient (S): 52 mm Hg. - Pulmonary arteries: Systolic pressure was not measured directly,   but based on RVSP is moderately to severely increased. - Pericardium, extracardiac: There was no pericardial effusion.  Laboratory Data:  Chemistry Recent Labs  Lab 03/07/18 0450 03/08/18 0430  NA 138 140  K 2.5* 3.3*  CL 101 106  CO2 28 28  GLUCOSE 100* 135*  BUN 13 16  CREATININE 0.81 0.76  CALCIUM 8.3* 8.5*  GFRNONAA >60 >60  GFRAA >60 >60  ANIONGAP 9 6    No results for input(s): PROT, ALBUMIN, AST, ALT, ALKPHOS, BILITOT in the last 168 hours. Hematology Recent Labs  Lab 03/07/18 0450  WBC 14.8*  RBC 3.92  HGB 9.4*  HCT 30.2*  MCV 77.0*  MCH 24.0*  MCHC 31.1  RDW 17.9*  PLT 404*   Cardiac Enzymes Recent Labs  Lab 03/07/18 0450 03/07/18 0903 03/07/18 1422 03/07/18 2024  TROPONINI <0.03 <0.03 <0.03 <0.03   No results for input(s):  TROPIPOC in the last 168 hours.  BNP Recent Labs  Lab 03/07/18 0450  BNP 333.0*    DDimer  Recent Labs  Lab 03/07/18 0450  DDIMER 1.69*    Radiology/Studies:  Dg Chest 2 View  Result Date: 03/07/2018 CLINICAL DATA:  37 year old female with shortness of breath. EXAM: CHEST - 2 VIEW COMPARISON:  Chest CT dated 01/11/2018 FINDINGS: There is cardiomegaly with vascular congestion and probable mild edema. Superimposed pneumonia is not excluded. Clinical correlation is recommended. There is no focal consolidation, pleural effusion, or pneumothorax. Median sternotomy wires. No acute osseous pathology. IMPRESSION: Cardiomegaly with mild vascular congestion. Pneumonia is not excluded. Clinical correlation is recommended. Electronically Signed   By: [name] M.D.   On: 03/07/2018 06:40   US Venous Img Lower Bilateral  Result Date: 03/07/2018 CLINICAL DATA:  37 year old female with a history of bilateral leg pain EXAM: BILATERAL LOWER EXTREMITY VENOUS DOPPLER ULTRASOUND TECHNIQUE: Gray-scale sonography with graded compression, as well as color Doppler and duplex ultrasound were performed to evaluate the lower extremity deep venous systems from the level of the common femoral vein and including the common femoral, femoral, profunda femoral, popliteal and calf veins including the posterior tibial, peroneal and gastrocnemius veins when visible. The superficial great saphenous vein was also interrogated. Spectral Doppler was utilized to evaluate flow at rest and with distal augmentation maneuvers in the common femoral, femoral and popliteal veins. COMPARISON:  None. FINDINGS: RIGHT  LOWER EXTREMITY Common Femoral Vein: No evidence of thrombus. Normal compressibility, respiratory phasicity and response to augmentation. Saphenofemoral Junction: No evidence of thrombus. Normal compressibility and flow on color Doppler imaging. Profunda Femoral Vein: No evidence of thrombus. Normal compressibility and flow  on color Doppler imaging. Femoral Vein: No evidence of thrombus. Normal compressibility, respiratory phasicity and response to augmentation. Popliteal Vein: No evidence of thrombus. Normal compressibility, respiratory phasicity and response to augmentation. Calf Veins: No evidence of thrombus. Normal compressibility and flow on color Doppler imaging. Superficial Great Saphenous Vein: No evidence of thrombus. Normal compressibility and flow on color Doppler imaging. Other Findings:  None. LEFT LOWER EXTREMITY Common Femoral Vein: No evidence of thrombus. Normal compressibility, respiratory phasicity and response to augmentation. Saphenofemoral Junction: No evidence of thrombus. Normal compressibility and flow on color Doppler imaging. Profunda Femoral Vein: No evidence of thrombus. Normal compressibility and flow on color Doppler imaging. Femoral Vein: No evidence of thrombus. Normal compressibility, respiratory phasicity and response to augmentation. Popliteal Vein: No evidence of thrombus. Normal compressibility, respiratory phasicity and response to augmentation. Calf Veins: No evidence of thrombus. Normal compressibility and flow on color Doppler imaging. Superficial Great Saphenous Vein: No evidence of thrombus. Normal compressibility and flow on color Doppler imaging. Other Findings:  None. IMPRESSION: Sonographic survey of the bilateral lower extremities negative for DVT Electronically Signed   By: Gilmer Mor D.O.   On: 03/07/2018 10:35     Assessment & Plan    1. Chronic Diastolic CHF - she reports that overall her breathing has been stable and denies any specific orthopnea, PND, or lower extremity edema. BNP at 333 on admission which is consistent with prior values. Initial and cyclic troponin values have been negative. Blood cultures negative thus far. CXR shows cardiomegaly with mild vascular congestion.   - she has been receiving IV Lasix 40 mg twice daily and is overall -1.3 L with weight  stable at 214 lbs. Would anticipate transitioning back to PO Torsemide 20mg  daily today with additional K+ supplementation as outlined below. Will need a repeat BMET in 1 week. We reviewed sodium and fluid restriction along with taking an extra Torsemide tablet for weight gain > 3 lbs in one night or > 5 lbs in one week.   2. Mitral Stenosis - recent TEE showed moderate to severe mitral stenosis and mild MR. Has been referred to CT surgery and has an appointment on 03/20/2018 with Dr. Cornelius Moras.  3. HTN - BP has been stable at 107/56 - 119/85 since admission.  - continue current medication regimen.   4. Hypokalemia/ Hypomagnesemia - K+ 2.5 at admission with Mg at 1.6. Replaced and at K+ at 3.3 and Mg at 2.2. QTc was borderline on admission but has improved by review of telemetry. Likely secondary to significant electrolyte abnormalities.  - she was on K-dur 20 mEq BID prior to admission. Would increase to 40 mEq BID and repeat a BMET and Mg in one week.   5. Chronic Leukocytosis/ Fevers - blood cultures were obtained upon admission. Have been negative to date.     For questions or updates, please contact [facility] Please consult [email or web address] for contact info under Cardiology/STEMI.  Signed, Ellsworth Lennox, PA-C 03/08/2018, 10:48 AM Pager: [phone]   Attending note  Patient seen and discussed with PA Iran Ouch, I agree with her documentation above. 37 yo female history of chronic diastolic HF, HTN, post MV repair stenosis, pulm HTN admitted with SOB, volume overload, and fever/leukocytosis.   Has  upcoming appt with Dr Marcia Brashwen eary July to evalaute her post MV repair MV stenosis. Historically her echo gradients have varied, likely due to differences in loading conditions.    08/2017 echo LVEF 55-60%, no WMAs, MV anular ring with mean grad 15, PASP 67 01/2018 TEE: MV anular ring, restrictive function of posterior leaflet. Moderate to severe MS mean grad 9. Planimetered area is 1.39.      WBC 14.8 Hgb 9.4 Plt 404 K 2.5 Cr 0.81 BUN 13 BNP 333 D-dimer 1.69  Mg 1.6  Trop neg x 4 CXR cardiomegaly with mild congestion, cannot exclude pneumonia 01/2018 CT PE no PE, diffuse opacities in lungs likely edema  Discharge weight 01/2018 228 lbs. Reported weight this admit 214 lbs.     Presents with acute on chronic diastolic HF complicated by post MV repair stenosis and pulmonary HTN. Negtive 1.8 L yesterday, 1.3 L since admission. Downtrend in Cr with diuresis consistent with venous congestion and CHF. She is on lasix 40mg  IV bid. Appears euvolemic, d/c IV lasix, would resume her home toresmide starting tomorrow. Increase her home KCl to 40mEq bid. Ok for discharge, we will arrange outpatient f/u in 2 weeks, arrange BMET/Mg in 1 week   Dina RichJonathan Branch MD

## 2018-03-12 LAB — CULTURE, BLOOD (ROUTINE X 2)
CULTURE: NO GROWTH
Culture: NO GROWTH
Special Requests: ADEQUATE
Special Requests: ADEQUATE

## 2018-03-20 ENCOUNTER — Telehealth: Payer: Self-pay | Admitting: Nurse Practitioner

## 2018-03-20 ENCOUNTER — Other Ambulatory Visit: Payer: Self-pay | Admitting: *Deleted

## 2018-03-20 ENCOUNTER — Encounter: Payer: Self-pay | Admitting: Thoracic Surgery (Cardiothoracic Vascular Surgery)

## 2018-03-20 ENCOUNTER — Telehealth: Payer: Self-pay

## 2018-03-20 ENCOUNTER — Institutional Professional Consult (permissible substitution) (INDEPENDENT_AMBULATORY_CARE_PROVIDER_SITE_OTHER): Payer: Medicaid Other | Admitting: Thoracic Surgery (Cardiothoracic Vascular Surgery)

## 2018-03-20 ENCOUNTER — Other Ambulatory Visit: Payer: Self-pay

## 2018-03-20 ENCOUNTER — Telehealth (HOSPITAL_COMMUNITY): Payer: Self-pay | Admitting: Dentistry

## 2018-03-20 VITALS — BP 106/64 | HR 83 | Resp 18 | Ht 64.0 in | Wt 218.0 lb

## 2018-03-20 DIAGNOSIS — I052 Rheumatic mitral stenosis with insufficiency: Secondary | ICD-10-CM

## 2018-03-20 DIAGNOSIS — I340 Nonrheumatic mitral (valve) insufficiency: Secondary | ICD-10-CM

## 2018-03-20 DIAGNOSIS — I361 Nonrheumatic tricuspid (valve) insufficiency: Secondary | ICD-10-CM

## 2018-03-20 DIAGNOSIS — Z01818 Encounter for other preprocedural examination: Secondary | ICD-10-CM

## 2018-03-20 DIAGNOSIS — I5032 Chronic diastolic (congestive) heart failure: Secondary | ICD-10-CM | POA: Insufficient documentation

## 2018-03-20 DIAGNOSIS — I7409 Other arterial embolism and thrombosis of abdominal aorta: Secondary | ICD-10-CM

## 2018-03-20 DIAGNOSIS — Z9889 Other specified postprocedural states: Secondary | ICD-10-CM

## 2018-03-20 DIAGNOSIS — I272 Pulmonary hypertension, unspecified: Secondary | ICD-10-CM | POA: Diagnosis not present

## 2018-03-20 DIAGNOSIS — I071 Rheumatic tricuspid insufficiency: Secondary | ICD-10-CM | POA: Insufficient documentation

## 2018-03-20 DIAGNOSIS — R0602 Shortness of breath: Secondary | ICD-10-CM

## 2018-03-20 NOTE — Telephone Encounter (Signed)
Left message to call back  

## 2018-03-20 NOTE — Telephone Encounter (Signed)
03/20/2018  Patient:            Theresa DaneLatasha Y Zieger Date of Birth:  02-Nov-1980 MRN:                [id number]   [facility] call and schedule dental consult appointment at [phone].  Katie Wright/Dr. Kristin BruinsKulinski

## 2018-03-20 NOTE — Telephone Encounter (Signed)
-----   Message from Iona CoachLauren W Brown, RN sent at 03/20/2018 11:17 AM EDT ----- Regarding: pt needs a cath Hey,  This is another open pt who needs a cath per Dr Cornelius Moraswen.  Thanks, Lauren  ----- Message ----- From: Purcell Nailswen, Clarence H, MD Sent: 03/20/2018  11:17 AM To: Iona CoachLauren W Brown, RN, Tonny BollmanMichael Cooper, MD  Here's another mitral valve patient who needs L+R heart cath prior [facility]

## 2018-03-20 NOTE — Telephone Encounter (Signed)
Phone call to patient to inform patient of instructions for 13 hr prep for CT on 7/18 at 9:30am.. Pt verbalized understanding. Prescription called into pharmacy. 7/17 at 2030- 50mg  Prednisone 7/18 at 0230- 50mg  Prednisone 7/18 at 0830- 50mg  Prednisone and 50mg  Benadryl

## 2018-03-20 NOTE — Patient Instructions (Signed)
Continue all previous medications without any changes at this time  Stop smoking immediately and permanently.  

## 2018-03-20 NOTE — Progress Notes (Signed)
[address]       [name] [address]             [phone]     CARDIOTHORACIC SURGERY CONSULTATION REPORT  Referring Provider is [name], MD PCP is House, Eugenio Hoes, [name]  Chief Complaint  Patient presents with  . Mitral Stenosis  . Mitral Regurgitation    s/p mitral valve repair 2014    HPI:  Patient is an obese 37 year old African-American female with long standing history of mitral valve disease and chronic diastolic congestive heart failure status post mitral valve repair in 2014 who has been referred for surgical consultation to discuss treatment options for management of recurrent mitral regurgitation and mitral stenosis.  Patient states that her heart disease began approximately 9 years ago when she was pregnant with her second child.  She states that she developed pericarditis and congestive heart failure.  She was eventually diagnosed with mitral valve disease and sarcoidosis.  She underwent mitral valve repair in 2014.  At the time she lived in [place].  Following surgery she states that she quickly began to experience worsening problems of congestive heart failure.  Within a year following surgery she had been hospitalized on several occasions with acute exacerbation of chronic diastolic congestive heart failure.  She was referred back to her cardiac surgeon and the possibility of redo surgery was discussed.  She moved to [place] in January 2018.  She has been followed intermittently since then by Dr. Purvis Sheffield, and she has been hospitalized on numerous occasions with acute exacerbation of chronic diastolic congestive heart failure and pulmonary edema.  Follow-up echocardiograms have documented the presence of normal left ventricular systolic function with at least mild mitral regurgitation and increased transvalvular gradients consistent with moderate to severe mitral stenosis.  She underwent transesophageal echocardiogram on Jan 25, 2018 during her most recent hospitalization for congestive heart failure.  Left ventricular systolic function was reported to be normal with ejection fraction estimated 55 to 60%.  There was felt to be moderate thickening with mild tethering of the anterior leaflet and severely restricted motion of the posterior leaflet of the mitral valve.  Findings were felt to be consistent with moderate to severe mitral stenosis with mean transvalvular gradient estimated 9 mmHg and planimeter valve area measured 1.39 cm.  There was eccentric mitral regurgitation.  Cardiothoracic surgical consultation was requested.  Patient is single and lives with her mother in [place].  She is currently unemployed.  Her children are ages 57 and 15.  She describes chronic symptoms of exertional shortness of breath, orthopnea, lower extremity edema, and a chronic cough which wax and wane in severity but have essentially remained present ever since she underwent surgery in 2014.  She has been hospitalized on countless occasions for acute exacerbation of congestive heart failure, with symptoms always improved following intravenous diuresis.  She takes oral diuretics regularly and frequently doubles up on dosage if her weight is up or shortness of breath gets worse.  She has a chronic cough with occasional episodes of frank hemoptysis.  She reports some occasional tightness across her chest which is typically transient and fleeting, not necessarily related to shortness of breath, and not at all related to physical exertion.  She admits that she suffers from some degree of chronic anxiety and she continues to smoke cigarettes, although she has cut back to only a handful of cigarettes daily.  She reports occasional episodes of transient blurry  vision which occur only in her left eye.  These have been occurring off and on for several years.  Past Medical History:  Diagnosis Date  . Chronic diastolic CHF (congestive heart failure) (HCC)     . Chronic diastolic congestive heart failure (HCC)   . History of open heart surgery   . Hypertension   . Mitral valve disease   . Normocytic anemia   . Obesity   . Pericarditis   . Premature atrial contractions   . Pulmonary hypertension (HCC)   . PVC's (premature ventricular contractions)    a. h/o palpitations with event monitor in 03/2017 showing NSR with PACs/PVCs.  . Recurrent mitral valve stenosis and regurgitation s/p mitral valve repair   . S/P mitral valve repair 03/2013   [place], [place]  . Sarcoidosis   . Tobacco abuse   . Tricuspid regurgitation     Past Surgical History:  Procedure Laterality Date  . ABDOMINAL HYSTERECTOMY    . CESAREAN SECTION     X's 2  . MITRAL VALVE REPAIR  03/26/2013    [facility] in [place], [place]   . PARTIAL HYSTERECTOMY  2011   PID w/problem with fallopian tubes  . TEE WITHOUT CARDIOVERSION N/A 05/26/2016   Procedure: TRANSESOPHAGEAL ECHOCARDIOGRAM (TEE);  Surgeon: [name], MD;  Location: [facility];  Service: Cardiovascular;  Laterality: N/A;  . TEE WITHOUT CARDIOVERSION N/A 01/25/2018   Procedure: TRANSESOPHAGEAL ECHOCARDIOGRAM (TEE) WITH PROPOFOL;  Surgeon: Jonelle Sidle, MD;  Location: [facility];  Service: Cardiovascular;  Laterality: N/A;    Family History  Problem Relation Age of Onset  . Heart failure Father        just received LVAD  . Heart disease Paternal Grandfather        stent placement    Social History   Socioeconomic History  . Marital status: Single    Spouse name: Not on file  . Number of children: Not on file  . Years of education: Not on file  . Highest education level: Not on file  Occupational History  . Not on file  Social Needs  . Financial resource strain: Not on file  . Food insecurity:    Worry: Not on file    Inability: Not on file  . Transportation needs:    Medical: Not on file    Non-medical: Not on file  Tobacco Use  . Smoking status: Current Some  Day Smoker    Packs/day: 0.25    Types: Cigarettes    Start date: 11/03/1999  . Smokeless tobacco: Never Used  Substance and Sexual Activity  . Alcohol use: No    Alcohol/week: 0.0 oz  . Drug use: No  . Sexual activity: Not on file  Lifestyle  . Physical activity:    Days per week: Not on file    Minutes per session: Not on file  . Stress: Not on file  Relationships  . Social connections:    Talks on phone: Not on file    Gets together: Not on file    Attends religious service: Not on file    Active member of club or organization: Not on file    Attends meetings of clubs or organizations: Not on file    Relationship status: Not on file  . Intimate partner violence:    Fear of current or ex partner: Not on file    Emotionally abused: Not on file    Physically abused: Not on file  Forced sexual activity: Not on file  Other Topics Concern  . Not on file  Social History Narrative  . Not on file    Current Outpatient Medications  Medication Sig Dispense Refill  . bumetanide (BUMEX) 2 MG tablet Take 2 mg by mouth 2 (two) times daily.     Marland Kitchen EPINEPHrine (EPIPEN 2-PAK) 0.3 mg/0.3 mL IJ SOAJ injection Inject 0.3 Units into the muscle once as needed.    . ferrous sulfate 325 (65 FE) MG tablet Take 1 tablet (325 mg total) by mouth 2 (two) times daily with a meal.  3  . metoprolol succinate (TOPROL-XL) 25 MG 24 hr tablet TAKE 1 TABLET BY MOUTH DAILY 30 tablet 6  . potassium chloride SA (K-DUR,KLOR-CON) 20 MEQ tablet Take 2 tablets (40 mEq total) by mouth daily. (Patient taking differently: Take 40 mEq by mouth daily. 4 TABLETS DAILY) 60 tablet 2   No current facility-administered medications for this visit.     Allergies  Allergen Reactions  . Lisinopril Shortness Of Breath and Swelling    Throat swelling  . Penicillins Shortness Of Breath and Swelling    Has patient had a PCN reaction causing immediate rash, facial/tongue/throat swelling, SOB or lightheadedness with  hypotension: Yes Has patient had a PCN reaction causing severe rash involving mucus membranes or skin necrosis: Yes Has patient had a PCN reaction that required hospitalization No Has patient had a PCN reaction occurring within the last 10 years: No If all of the above answers are "NO", then may proceed with Cephalosporin use.   Marland Kitchen Perflutren Lipid Microsphere Shortness Of Breath    Other reaction(s): Chest Pain/Tightness  . Bee Venom   . Contrast Media [Iodinated Diagnostic Agents] Hives and Itching    Itching and hives to throat, neck, face and arms.   No difficulty breathing.   This was given at [facility] approximately 2015. Contrast Dye  . Shellfish Allergy       Review of Systems:   General:  normal appetite, normal energy, + weight gain, no weight loss, no fever  Cardiac:  no chest pain with exertion, + occasional chest pain at rest, +SOB with any exertion, occasionial resting SOB, + PND, + orthopnea, no palpitations, no arrhythmia, no atrial fibrillation, + LE edema, + dizzy spells, no syncope  Respiratory:  + shortness of breath, no home oxygen, + intermittent productive cough, + chronic dry cough, no bronchitis, + wheezing, + hemoptysis, no asthma, + pain with inspiration or cough, no sleep apnea, no CPAP at night  GI:   No difficulty swallowing, + reflux, no frequent heartburn, no hiatal hernia, no abdominal pain, + constipation, no diarrhea, no hematochezia, no hematemesis, no melena  GU:   no dysuria,  no frequency, no urinary tract infection, no hematuria, no  kidney stones, no kidney disease  Vascular:  no pain suggestive of claudication, no pain in feet, + leg cramps, no varicose veins, no DVT, no non-healing foot ulcer  Neuro:   no stroke, no TIA's, no seizures, + headaches, + temporary blindness one eye,  no slurred speech, no peripheral neuropathy, no chronic pain, no instability of gait, no memory/cognitive dysfunction  Musculoskeletal: + arthritis, no  joint swelling, + myalgias, no difficulty walking, normal mobility   Skin:   no rash, no itching, no skin infections, no pressure sores or ulcerations  Psych:   + anxiety, + depression, no nervousness, + unusual recent stress  Eyes:   + blurry vision, + floaters, no recent  vision changes, + wears glasses or contacts  ENT:   no hearing loss, no loose or painful teeth, no dentures, last saw dentist > 1 year ago  Hematologic:  no easy bruising, no abnormal bleeding, no clotting disorder, no frequent epistaxis  Endocrine:  no diabetes, does not check CBG's at home     Physical Exam:   BP 106/64 (BP Location: Right Arm, Patient Position: Sitting, Cuff Size: Large)   Pulse 83   Resp 18   Ht 5\' 4"  (1.626 m)   Wt 218 lb (98.9 kg)   LMP 03/05/2018   SpO2 94% Comment: ON RA  BMI 37.42 kg/m   General:  Obese  well-appearing  HEENT:  Unremarkable   Neck:   no JVD, no bruits, no adenopathy   Chest:   clear to auscultation, symmetrical breath sounds, no wheezes, no rhonchi   CV:   RRR, grade II/VI systolic murmur   Abdomen:  soft, non-tender, no masses   Extremities:  warm, well-perfused, pulses not palpable, trace LE edema  Rectal/GU  Deferred  Neuro:   Grossly non-focal and symmetrical throughout  Skin:   Clean and dry, no rashes, no breakdown   Diagnostic Tests:   Transesophageal Echocardiography  Patient:    Faven, Watterson MR #:       [id number] Study Date: 01/25/2018 Gender:     F Age:        37 Height:     162.6 cm Weight:     103.5 kg BSA:        2.21 m^2 Pt. Status: Room:   ADMITTING    Nona Dell, M.D.  ATTENDING    Nona Dell, M.D.  PERFORMING   Chmg, [name]  SONOGRAPHER  Celesta Gentile, RCS  ORDERING     Iran Ouch, [place] M  REFERRING    Iran Ouch [place] M  cc:  ------------------------------------------------------------------- LV EF: 55% -   60%  ------------------------------------------------------------------- Indications:      Mitral  stenosis [non-rheumatic] 424.0.  ------------------------------------------------------------------- History:   PMH:  Acquired from the patient and from the patient&'s chart.  PMH:  Hx of Acute respiratory failure with hypoxia and Acute on chronic diastolic CHF (congestive heart failure)  Risk factors:  Hypertension.  ------------------------------------------------------------------- Study Conclusions  - Left ventricle: Systolic function was normal. The estimated   ejection fraction was in the range of 55% to 60%. Wall motion was   normal; there were no regional wall motion abnormalities. - Aorta: Mild atheroschlerotic plaque noted within the aortic arch. - Mitral valve: Moderately thickened leaflets with mild tethering   of the anterior leaflet and restricted motion of the posterior   leaflet. Chordae appear thickened and shortened as well. Annular   ring present. The findings are consistent with moderate to severe   stenosis. There was mild regurgitation directed eccentrically.   Mean gradient (D): 9 mm Hg. Planimetered valve area: 1.39 cm^2. - Left atrium: The atrium was dilated. There was moderatecontinuous   spontaneous echo contrast (&quot;smoke&quot;) in the appendage. Emptying   velocity was severely reduced. - Right ventricle: The cavity size was mildly dilated. - Right atrium: The atrium was dilated. - Atrial septum: No defect or patent foramen ovale was identified.   Few isolated bubbles noted in left atrium after 4-5 beats. - Tricuspid valve: There was moderate regurgitation. Peak RV-RA   gradient (S): 52 mm Hg. - Pulmonary arteries: Systolic pressure was not measured directly,   but based on RVSP is moderately to severely increased. -  Pericardium, extracardiac: There was no pericardial effusion.  ------------------------------------------------------------------- Labs, prior tests, procedures, and surgery: S/P mitral valve  repair.  ------------------------------------------------------------------- Study data:   Study status:  Routine.  Procedure:  The patient reported no pain pre or post test. Initial setup. The patient was brought to the laboratory. Surface ECG leads were monitored. Sedation. Conscious to deep sedation was administered with use of propofol per anesthesia service. Transesophageal echocardiography. Topical anesthesia was obtained using viscous lidocaine. A transesophageal probe was inserted by the attending cardiologist. Image quality was adequate.  Study completion:  The patient tolerated the procedure well. There were no complications. Diagnostic transesophageal echocardiography.  2D and color Doppler.  Birthdate:  Patient birthdate: 1981-08-20.  Age:  Patient is 37 yr old.  Sex:  Gender: female.    BMI: 39.1 kg/m^2.  Blood pressure:   112/60  Study date:  Study date: 01/25/2018. Study time: 10:50 AM.  Location:  Endoscopy 3.  -------------------------------------------------------------------  ------------------------------------------------------------------- Left ventricle:  Systolic function was normal. The estimated ejection fraction was in the range of 55% to 60%. Wall motion was normal; there were no regional wall motion abnormalities.  ------------------------------------------------------------------- Aortic valve:   Trileaflet.  ------------------------------------------------------------------- Aorta:  Mild atheroschlerotic plaque noted within the aortic arch.   ------------------------------------------------------------------- Mitral valve:  Moderately thickened leaflets with mild tethering of the anterior leaflet and restricted motion of the posterior leaflet. Chordae appear thickened and shortened as well. Annular ring present.  Doppler:   The findings are consistent with moderate to severe stenosis.   There was mild regurgitation directed eccentrically.     Planimetered valve area: 1.39 cm^2. Indexed valve area by planimetry: 0.63 cm^2/m^2.    Mean gradient (D): 9 mm Hg.   ------------------------------------------------------------------- Left atrium:  The atrium was dilated. There was moderatecontinuous spontaneous echo contrast (&quot;smoke&quot;) in the appendage.  Emptying velocity was severely reduced.  ------------------------------------------------------------------- Atrial septum:  No defect or patent foramen ovale was identified. Few isolated bubbles noted in left atrium after 4-5 beats.  ------------------------------------------------------------------- Right ventricle:  The cavity size was mildly dilated.  ------------------------------------------------------------------- Pulmonic valve:   Poorly visualized.  Doppler:  There was trivial regurgitation.  ------------------------------------------------------------------- Tricuspid valve:  Mildly thickened leaflets.  Doppler:  There was moderate regurgitation.  ------------------------------------------------------------------- Pulmonary artery:   Systolic pressure was not measured directly, but based on RVSP is moderately to severely increased.  ------------------------------------------------------------------- Right atrium:  The atrium was dilated.  ------------------------------------------------------------------- Pericardium:  There was no pericardial effusion.   ------------------------------------------------------------------- Post procedure conclusions Ascending Aorta:  - Mild atheroschlerotic plaque noted within the aortic arch.  ------------------------------------------------------------------- Measurements   LVOT                                       Value  LVOT ID, S                                 19    mm  LVOT area                                  2.84  cm^2    Mitral valve  Value  Mitral valve area,  planimetry              1.39  cm^2  Mitral valve area/bsa, planimetry          0.63  cm^2/m^2  Mitral mean velocity, D                    137   cm/s  Mitral mean gradient, D                    9     mm Hg  Mitral annulus VTI, D                      61.5  cm  Mitral maximal regurg velocity, PISA       362   cm/s  Mitral regurg VTI, PISA                    105   cm  Mitral ERO, PISA                           0.05  cm^2  Mitral regurg volume, PISA                 5     ml    Tricuspid valve                            Value  Tricuspid regurg peak velocity             360   cm/s  Tricuspid peak RV-RA gradient              52    mm Hg  Legend: (L)  and  (H)  mark values outside specified reference range.  ------------------------------------------------------------------- Prepared and Electronically Authenticated by  Nona Dell, M.D. 2019-05-15T12:50:04   Transthoracic Echocardiography  Patient:    Svetlana, Bagby MR #:       [id number] Study Date: 09/10/2017 Gender:     F Age:        36 Height:     162.6 cm Weight:     104.8 kg BSA:        2.23 m^2 Pt. Status: Room:       A306   ATTENDING    Erick Blinks [id number]  PERFORMING   Chmg, [name]  SONOGRAPHER  [facility]  ADMITTING    Opyd, Timothy S  ORDERING     Opyd, Timothy S  REFERRING    Opyd, Timothy S  cc:  ------------------------------------------------------------------- LV EF: 55% -   60%  ------------------------------------------------------------------- Indications:      CHF - 428.0.  ------------------------------------------------------------------- History:   PMH:  Severe mitral stenosis. Mitral valve repair in [place]. 2014. Respiratory failure.  Dyspnea.  Mitral valve disease.  Mitral stenosis.  Primary pulmonary hypertension.  Risk factors:  Hypertension.  ------------------------------------------------------------------- Study Conclusions  - Left ventricle: The cavity  size was normal. Systolic function was   normal. The estimated ejection fraction was in the range of 55%   to 60%. Wall motion was normal; there were no regional wall   motion abnormalities. - Ventricular septum: Septal motion showed paradox. - Mitral valve: Evidence of prior mitral repair. Mild retricted   leaflet posteriorly. Prior procedures included surgical repair.   An annular ring prosthesis was present. Valve area by pressure  half-time: 2.34 cm^2. Valve area by continuity equation (using   LVOT flow): 0.61 cm^2. - Left atrium: The atrium was mildly to moderately dilated. - Right atrium: The atrium was mildly to moderately dilated. - Tricuspid valve: There was moderate regurgitation. - Pulmonary arteries: Systolic pressure was moderately increased.   PA peak pressure: 67 mm Hg (S).  ------------------------------------------------------------------- Study data:  The previous study was not available, so comparison was made to the report of 06/10/2017.  Study status:  Routine. Procedure:  The patient reported no pain pre or post test. Transthoracic echocardiography. Image quality was adequate.  Study completion:  There were no complications.          Transthoracic echocardiography.  M-mode, complete 2D, spectral Doppler, and color Doppler.  Birthdate:  Patient birthdate: 04/30/1981.  Age:  Patient is 37 yr old.  Sex:  Gender: female.    BMI: 39.6 kg/m^2.  Blood pressure:     129/51  Patient status:  Inpatient.  Study date: Study date: 09/10/2017. Study time: 11:02 AM.  Location:  Bedside.   -------------------------------------------------------------------  ------------------------------------------------------------------- Left ventricle:  The cavity size was normal. Systolic function was normal. The estimated ejection fraction was in the range of 55% to 60%. Wall motion was normal; there were no regional wall  motion abnormalities.  ------------------------------------------------------------------- Aortic valve:   Trileaflet; normal thickness leaflets. Mobility was not restricted.  Doppler:  Transvalvular velocity was within the normal range. There was no stenosis. There was no regurgitation.   ------------------------------------------------------------------- Aorta:  Aortic root: The aortic root was normal in size.  ------------------------------------------------------------------- Mitral valve:  Evidence of prior mitral repair. Restricted leaflet motion posteriorly. Prior procedures included surgical repair. An annular ring prosthesis was present. Mobility was not restricted. Doppler:  Transvalvular velocity was within the normal range. There was no evidence for stenosis. There was no regurgitation.    Valve area by pressure half-time: 2.34 cm^2. Indexed valve area by pressure half-time: 1.05 cm^2/m^2. Valve area by continuity equation (using LVOT flow): 0.61 cm^2. Indexed valve area by continuity equation (using LVOT flow): 0.27 cm^2/m^2.    Mean gradient (D): 15 mm Hg. Peak gradient (D): 25 mm Hg.  ------------------------------------------------------------------- Left atrium:  The atrium was mildly to moderately dilated.   ------------------------------------------------------------------- Right ventricle:  The cavity size was normal. Wall thickness was normal. Systolic function was normal.  ------------------------------------------------------------------- Ventricular septum:   Septal motion showed paradox.  ------------------------------------------------------------------- Pulmonic valve:    The valve appears to be grossly normal. Doppler:  Transvalvular velocity was within the normal range. There was no evidence for stenosis. There was mild regurgitation.  ------------------------------------------------------------------- Tricuspid valve:   Structurally normal  valve.    Doppler: Transvalvular velocity was within the normal range. There was moderate regurgitation.  ------------------------------------------------------------------- Pulmonary artery:   The main pulmonary artery was normal-sized. Systolic pressure was moderately increased.  ------------------------------------------------------------------- Right atrium:  The atrium was mildly to moderately dilated.  ------------------------------------------------------------------- Pericardium:  There was no pericardial effusion.  ------------------------------------------------------------------- Systemic veins: Inferior vena cava: The vessel was moderately dilated. The respirophasic diameter changes were blunted (< 50%).  ------------------------------------------------------------------- Measurements   Left ventricle                           Value          Reference  LV ID, ED, PLAX chordal                  49.6  mm  43 - 52  LV ID, ES, PLAX chordal                  32    mm       23 - 38  LV fx shortening, PLAX chordal           35    %        >=29  LV PW thickness, ED                      14.8  mm       ----------  IVS/LV PW ratio, ED                      0.76           <=1.3  Stroke volume, 2D                        60    ml       ----------  Stroke volume/bsa, 2D                    27    ml/m^2   ----------  LV ejection fraction, 1-p A4C            68    %        ----------  LV end-diastolic volume, 2-p             92    ml       ----------  LV end-systolic volume, 2-p              41    ml       ----------  LV ejection fraction, 2-p                55    %        ----------  Stroke volume, 2-p                       51    ml       ----------  LV end-diastolic volume/bsa, 2-p         41    ml/m^2   ----------  LV end-systolic volume/bsa, 2-p          18    ml/m^2   ----------  Stroke volume/bsa, 2-p                   22.8  ml/m^2   ----------  LV e&', lateral                            8.92  cm/s     ----------  LV E/e&', lateral                         27.91          ----------  LV e&', medial                            6.64  cm/s     ----------  LV E/e&', medial                          37.5           ----------  LV e&', average                           7.78  cm/s     ----------  LV E/e&', average                         32.01          ----------    Ventricular septum                       Value          Reference  IVS thickness, ED                        11.2  mm       ----------    LVOT                                     Value          Reference  LVOT ID, S                               18    mm       ----------  LVOT area                                2.54  cm^2     ----------  LVOT peak velocity, S                    125   cm/s     ----------  LVOT mean velocity, S                    87.5  cm/s     ----------  LVOT VTI, S                              23.6  cm       ----------  LVOT peak gradient, S                    6     mm Hg    ----------    Aorta                                    Value          Reference  Aortic root ID, ED                       27    mm       ----------  Ascending aorta ID, A-P, S               23    mm       ----------    Left atrium                              Value          Reference  LA ID, A-P, ES                           43    mm       ----------  LA ID/bsa, A-P                           1.93  cm/m^2   <=2.2  LA volume, S                             70.8  ml       ----------  LA volume/bsa, S                         31.8  ml/m^2   ----------  LA volume, ES, 1-p A4C                   78.4  ml       ----------  LA volume/bsa, ES, 1-p A4C               35.2  ml/m^2   ----------  LA volume, ES, 1-p A2C                   63    ml       ----------  LA volume/bsa, ES, 1-p A2C               28.3  ml/m^2   ----------    Mitral valve                             Value          Reference  Mitral E-wave peak velocity               249   cm/s     ----------  Mitral A-wave peak velocity              181   cm/s     ----------  Mitral mean velocity, D                  171   cm/s     ----------  Mitral deceleration time         (H)     408   ms       150 - 230  Mitral pressure half-time                94    ms       ----------  Mitral mean gradient, D                  15    mm Hg    ----------  Mitral peak gradient, D                  25    mm Hg    ----------  Mitral E/A ratio, peak                   1.4            ----------  Mitral valve area, PHT, DP               2.34  cm^2     ----------  Mitral valve area/bsa, PHT,  DP           1.05  cm^2/m^2 ----------  Mitral valve area, LVOT                  0.61  cm^2     ----------  continuity  Mitral valve area/bsa, LVOT              0.27  cm^2/m^2 ----------  continuity  Mitral annulus VTI, D                    98.5  cm       ----------    Pulmonary arteries                       Value          Reference  PA pressure, S, DP               (H)     67    mm Hg    <=30    Tricuspid valve                          Value          Reference  Tricuspid regurg peak velocity           384   cm/s     ----------  Tricuspid peak RV-RA gradient            59    mm Hg    ----------    Right atrium                             Value          Reference  RA ID, S-I, ES, A4C                      44.6  mm       34 - 49  RA area, ES, A4C                         14.9  cm^2     8.3 - 19.5  RA volume, ES, A/L                       40.1  ml       ----------  RA volume/bsa, ES, A/L                   18    ml/m^2   ----------    Systemic veins                           Value          Reference  Estimated CVP                            8     mm Hg    ----------    Right ventricle                          Value          Reference  TAPSE  13.4  mm       ----------  RV pressure, S, DP               (H)     67    mm Hg    <=30  RV s&', lateral, S                         6.53  cm/s     ----------  Legend: (L)  and  (H)  mark values outside specified reference range.  ------------------------------------------------------------------- Prepared and Electronically Authenticated by  Georga Hacking, MD [facility] 2018-12-29T13:25:42      Impression:  I have personally reviewed the patient's recent transesophageal echocardiogram.  She underwent elective mitral valve repair in 2014 and has stage D severe symptomatic mitral stenosis and regurgitation.  She has suffered from symptoms of chronic diastolic congestive heart failure requiring numerous hospitalizations for acute exacerbation over the last several years.  Details regarding the circumstances of her previous mitral valve repair including intraoperative findings and the original etiology of disease remain unclear.  The patient describes a history of sarcoidosis and pericarditis in the past, and findings from her recent transthoracic and transesophageal echocardiogram suggest the possibility of inflammatory disease such as rheumatic heart disease and/or sarcoidosis.  There is moderate thickening with mild to moderate restricted mobility of the anterior leaf of the mitral valve with severely restricted mobility of the posterior leaflet.  There is an eccentric jet of regurgitation which courses anteriorly around the left atrium.  Although the mitral regurgitation was not directly quantified, it appears at least moderate in severity.  The annuloplasty ring appears intact.  Ring size remains unknown at this time.  By planimetry the valve area is less than 1.5 cm and mean transvalvular gradients have ranged between 8 and 15 mmHg.  Patient also has significant right ventricular chamber enlargement with moderate tricuspid regurgitation.  Options at this point include continued medical therapy versus redo mitral valve repair or replacement with or without tricuspid valve repair.  Based upon review of the patient's  TEE I am not very optimistic that her mitral valve could be repaired a second time and yield a satisfactory long-term result.  Based upon review of the patient's records and her personal account of her struggles over the past few years, it seems clear that she has failed an attempt at medical therapy.   Plan:  The patient and her friend were counseled at length regarding the indications, risks and potential benefits of redo mitral valve repair or replacement with possible tricuspid valve repair.  The rationale for elective surgery has been explained, including a comparison between surgery and continued medical therapy with close follow-up.  The likelihood of successful and durable valve repair has been discussed with particular reference to the findings of their recent echocardiogram.  Based upon these findings and previous experience, I have quoted her less than 25 percent likelihood of successful redo valve repair.  In the event that their valve cannot be successfully repaired, we discussed the possibility of replacing the mitral valve using a mechanical prosthesis with the attendant need for long-term anticoagulation versus the alternative of replacing it using a bioprosthetic tissue valve with its potential for late structural valve deterioration and failure, depending upon the patient's longevity.  The patient specifically requests that if the mitral valve must be replaced that it be done using a mechanical valve.   Alternative surgical approaches have been discussed including a  comparison between conventional sternotomy and minimally-invasive techniques.  The added risk and potential complications associated with redo surgery have been discussed.  Expectations for her postoperative convalescence have been discussed.  The patient is interested in proceeding with surgery at some point in the near future.  She has been counseled that she must find a way to quit smoking cigarettes completely.  She will be  referred for dental service consultation for routine examination and cleaning with additional procedures if necessary.  She will need left and right heart catheterization.  Finally, she will undergo CT angiography to evaluate the feasibility of peripheral arterial cannulation for surgery.  The patient will return to our office for follow-up in approximately 2 to 3 weeks.  All questions answered.    I spent in excess of 90 minutes during the conduct of this office consultation and >50% of this time involved direct face-to-face encounter with the patient for counseling and/or coordination of their care.    Salvatore Decent. Cornelius Moras, MD 03/20/2018 10:35 AM

## 2018-03-22 NOTE — Telephone Encounter (Signed)
Scheduled patient for L/R heart cath August 1 at 1330 (arrival time 1130).  Called patient to confirm date/time and review instructions. Left message to call back.

## 2018-03-22 NOTE — Telephone Encounter (Signed)
Patient agrees to L/R heart cath with Dr. Excell Seltzerooper on 8/1. She will come 7/29 for pre-procedure lab work and to pick up instruction letter.  She was grateful for call and agrees with treatment plan.

## 2018-03-22 NOTE — Addendum Note (Signed)
Addended by: Gunnar FusiKEMP, Natalin Bible A on: 03/22/2018 03:44 PM   Modules accepted: Orders

## 2018-03-23 ENCOUNTER — Telehealth (HOSPITAL_COMMUNITY): Payer: Self-pay | Admitting: Dentistry

## 2018-03-23 NOTE — Telephone Encounter (Signed)
03/23/2018  Patient:            Theresa Crawford Date of Birth:  1981/06/25 MRN:                [id number]   A message was left on the machine of her home phone and cell phone concerning need for dental consultation appointment prior to anticipated heart valve surgery. Patient is to call the dental medicine clinic at [phone]  To arrange for dental consultation appointment.  Charlynne Panderonald F Kulinski, DDS

## 2018-03-28 ENCOUNTER — Ambulatory Visit (HOSPITAL_COMMUNITY): Payer: Self-pay | Admitting: Dentistry

## 2018-03-28 ENCOUNTER — Encounter (HOSPITAL_COMMUNITY): Payer: Self-pay | Admitting: Dentistry

## 2018-03-28 VITALS — BP 105/50 | HR 63 | Temp 98.1°F

## 2018-03-28 DIAGNOSIS — K045 Chronic apical periodontitis: Secondary | ICD-10-CM

## 2018-03-28 DIAGNOSIS — K029 Dental caries, unspecified: Secondary | ICD-10-CM

## 2018-03-28 DIAGNOSIS — K0850 Unsatisfactory restoration of tooth, unspecified: Secondary | ICD-10-CM

## 2018-03-28 DIAGNOSIS — K0401 Reversible pulpitis: Secondary | ICD-10-CM

## 2018-03-28 DIAGNOSIS — K036 Deposits [accretions] on teeth: Secondary | ICD-10-CM

## 2018-03-28 DIAGNOSIS — M264 Malocclusion, unspecified: Secondary | ICD-10-CM

## 2018-03-28 DIAGNOSIS — I052 Rheumatic mitral stenosis with insufficiency: Secondary | ICD-10-CM

## 2018-03-28 DIAGNOSIS — M2632 Excessive spacing of fully erupted teeth: Secondary | ICD-10-CM

## 2018-03-28 DIAGNOSIS — Z01818 Encounter for other preprocedural examination: Secondary | ICD-10-CM

## 2018-03-28 DIAGNOSIS — K06010 Localized gingival recession, unspecified: Secondary | ICD-10-CM

## 2018-03-28 DIAGNOSIS — K08409 Partial loss of teeth, unspecified cause, unspecified class: Secondary | ICD-10-CM

## 2018-03-28 DIAGNOSIS — K0530 Chronic periodontitis, unspecified: Secondary | ICD-10-CM

## 2018-03-28 DIAGNOSIS — F40232 Fear of other medical care: Secondary | ICD-10-CM

## 2018-03-28 DIAGNOSIS — I5032 Chronic diastolic (congestive) heart failure: Secondary | ICD-10-CM

## 2018-03-28 NOTE — Patient Instructions (Signed)
[place]    Department of Dental Medicine     DR. Mariyana Fulop      HEART VALVES AND MOUTH CARE:  FACTS:   If you have any infection in your mouth, it can infect your heart valve.  If you heart valve is infected, you will be seriously ill.  Infections in the mouth can be SILENT and do not always cause pain.  Examples of infections in the mouth are gum disease, dental cavities, and abscesses.  Some possible signs of infection are: Bad breath, bleeding gums, or teeth that are sensitive to sweets, hot, and/or cold. There are many other signs as well.  WHAT YOU HAVE TO DO:   Brush your teeth after meals and at bedtime. Spend at least 2 minutes brushing well, especially behind your back teeth and all around your teeth that stand alone. Brush at the gumline also.  Do not go to bed without brushing your teeth and flossing.  If you gums bleed when you brush or floss, do NOT stop brushing or flossing. It usually means that your gums need more attention and better cleaning.   If your Dentist or Dr. Nehal Witting gave you a prescription mouthwash to use, make sure to use it as directed. If you run out of the medication, get a refill at the pharmacy.   If you were given any other medications or directions by your Dentist, please follow them. If you did not understand the directions or forget what you were told, please call. We will be happy to refresh her memory.  If you need antibiotics before dental procedures, make sure you take them one hour prior to every dental visit as directed.   Get a dental checkup every 4-6 months in order to keep your mouth healthy, or to find and treat any new infection. You will most likely need your teeth cleaned or gums treated at the same time.  If you are not able to come in for your scheduled appointment, call your Dentist as soon as possible to reschedule.  If you have a problem in between dental visits, call your Dentist.  

## 2018-03-28 NOTE — Progress Notes (Signed)
DENTAL CONSULTATION  Date of Consultation:  03/28/2018 Patient Name:   Theresa Crawford Date of Birth:   [birth year] Medical Record Number: [id number]  VITALS: BP (!) 105/50 (BP Location: Right Arm)   Pulse 63   Temp 98.1 F (36.7 C)   LMP 03/05/2018   CHIEF COMPLAINT: Patient is referred by Dr. Cornelius Moras for a dental consultation.  HPI: Theresa Crawford is a 37 year old female with recurrent mitral valve stenosis and regurgitation status post mitral valve repair. Patient with anticipated mitral valve replacement or repair with Dr. Cornelius Moras in the near future. Patient is now seen as part of a pre-heart valve surgery dental protocol examination.  Patient is currently complaining of lower right molar pain. This occurs in an intermittent fashion. Patient describes having sharp pain that previously reached an intensity of 10 out of 10 but is currently 7 out of 10 today. This has been hurting for 3 weeks.  The patient last saw the dentist in 2012 to have 2 teeth pulled. This was in [place] with an oral Careers adviser. Patient denies any complications from those dental extractions. Patient indicates that she did need to be "put to sleep" for those extractions. The patient denies having partial dentures.  Patient does have severe dental phobia by report.  PROBLEM LIST: Patient Active Problem List   Diagnosis Date Noted  . Recurrent mitral valve stenosis and regurgitation s/p mitral valve repair     Priority: High  . Chronic diastolic congestive heart failure (HCC)   . Tricuspid regurgitation   . Hypomagnesemia 03/07/2018  . Tobacco abuse   . Dyspnea 01/11/2018  . Prolonged QT interval 09/10/2017  . Normochromic normocytic anemia   . Pulmonary edema 07/05/2017  . Hypokalemia 06/12/2017  . Pulmonary hypertension (HCC) 06/12/2017  . Flash pulmonary edema (HCC) 06/11/2017  . Depression with anxiety 06/11/2017  . Acute pulmonary edema (HCC) 10/10/2016  . S/P mitral valve repair   . CAP (community acquired  pneumonia) 05/25/2016  . Leukocytosis 05/24/2016  . Acute respiratory failure with hypoxia (HCC) 05/23/2016  . Acute on chronic diastolic CHF (congestive heart failure) (HCC) 05/23/2016  . Cough with hemoptysis 05/23/2016  . Postpartum complication pericarditis in 2009 with eventual needing MVR in 2015 05/23/2016  . Anemia, iron deficiency 05/23/2016  . Hypertension   . SOB (shortness of breath)   . Respiratory distress 02/16/2016  . Essential hypertension 02/16/2016    PMH: Past Medical History:  Diagnosis Date  . Chronic diastolic CHF (congestive heart failure) (HCC)   . Chronic diastolic congestive heart failure (HCC)   . History of open heart surgery   . Hypertension   . Mitral valve disease   . Normocytic anemia   . Obesity   . Pericarditis   . Premature atrial contractions   . Pulmonary hypertension (HCC)   . PVC's (premature ventricular contractions)    a. h/o palpitations with event monitor in 03/2017 showing NSR with PACs/PVCs.  . Recurrent mitral valve stenosis and regurgitation s/p mitral valve repair   . S/P mitral valve repair 03/2013   [place], [place]  . Sarcoidosis   . Tobacco abuse   . Tricuspid regurgitation     PSH: Past Surgical History:  Procedure Laterality Date  . ABDOMINAL HYSTERECTOMY    . CESAREAN SECTION     X's 2  . MITRAL VALVE REPAIR  03/26/2013    [facility] in [place], [place]   . PARTIAL HYSTERECTOMY  2011   PID w/problem with fallopian tubes  . TEE  WITHOUT CARDIOVERSION N/A 05/26/2016   Procedure: TRANSESOPHAGEAL ECHOCARDIOGRAM (TEE);  Surgeon: [name], MD;  Location: [facility];  Service: Cardiovascular;  Laterality: N/A;  . TEE WITHOUT CARDIOVERSION N/A 01/25/2018   Procedure: TRANSESOPHAGEAL ECHOCARDIOGRAM (TEE) WITH PROPOFOL;  Surgeon: Jonelle Sidle, MD;  Location: [facility];  Service: Cardiovascular;  Laterality: N/A;    ALLERGIES: Allergies  Allergen Reactions  . Lisinopril Shortness Of  Breath and Swelling    Throat swelling  . Penicillins Shortness Of Breath and Swelling    Has patient had a PCN reaction causing immediate rash, facial/tongue/throat swelling, SOB or lightheadedness with hypotension: Yes Has patient had a PCN reaction causing severe rash involving mucus membranes or skin necrosis: Yes Has patient had a PCN reaction that required hospitalization No Has patient had a PCN reaction occurring within the last 10 years: No If all of the above answers are "NO", then may proceed with Cephalosporin use.   Marland Kitchen Perflutren Lipid Microsphere Shortness Of Breath    Other reaction(s): Chest Pain/Tightness  . Bee Venom   . Contrast Media [Iodinated Diagnostic Agents] Hives and Itching    Itching and hives to throat, neck, face and arms.   No difficulty breathing.   This was given at [facility] approximately 2015. Contrast Dye  . Shellfish Allergy     MEDICATIONS: Current Outpatient Medications  Medication Sig Dispense Refill  . citalopram (CELEXA) 40 MG tablet Take 40 mg by mouth 2 (two) times daily.    Marland Kitchen docusate sodium (COLACE) 100 MG capsule Take 100 mg by mouth daily.    Marland Kitchen EPINEPHrine (EPIPEN 2-PAK) 0.3 mg/0.3 mL IJ SOAJ injection Inject 0.3 Units into the muscle once as needed.    . ferrous sulfate 325 (65 FE) MG tablet Take 1 tablet (325 mg total) by mouth 2 (two) times daily with a meal.  3  . metoprolol succinate (TOPROL-XL) 25 MG 24 hr tablet TAKE 1 TABLET BY MOUTH DAILY 30 tablet 6  . potassium chloride SA (K-DUR,KLOR-CON) 20 MEQ tablet Take 40 mEq by mouth 2 (two) times daily.    . QUEtiapine (SEROQUEL) 25 MG tablet Take 25 mg by mouth at bedtime.    . torsemide (DEMADEX) 20 MG tablet Take 20 mg by mouth 2 (two) times daily.    . potassium chloride SA (K-DUR,KLOR-CON) 20 MEQ tablet Take 2 tablets (40 mEq total) by mouth daily. (Patient taking differently: Take 40 mEq by mouth daily. 4 TABLETS DAILY) 60 tablet 2   No current facility-administered  medications for this visit.      LABS: Lab Results  Component Value Date   WBC 14.8 (H) 03/07/2018   HGB 9.4 (L) 03/07/2018   HCT 30.2 (L) 03/07/2018   MCV 77.0 (L) 03/07/2018   PLT 404 (H) 03/07/2018      Component Value Date/Time   NA 140 03/08/2018 0430   K 3.3 (L) 03/08/2018 0430   CL 106 03/08/2018 0430   CO2 28 03/08/2018 0430   GLUCOSE 135 (H) 03/08/2018 0430   BUN 16 03/08/2018 0430   CREATININE 0.76 03/08/2018 0430   CALCIUM 8.5 (L) 03/08/2018 0430   GFRNONAA >60 03/08/2018 0430   GFRAA >60 03/08/2018 0430   No results found for: INR, PROTIME No results found for: PTT  SOCIAL HISTORY: Social History   Socioeconomic History  . Marital status: Single    Spouse name: Not on file  . Number of children: 2  . Years of education: Not on file  .  Highest education level: Not on file  Occupational History  . Not on file  Social Needs  . Financial resource strain: Not on file  . Food insecurity:    Worry: Not on file    Inability: Not on file  . Transportation needs:    Medical: Not on file    Non-medical: Not on file  Tobacco Use  . Smoking status: Current Some Day Smoker    Packs/day: 0.25    Types: Cigarettes    Start date: 11/03/1999  . Smokeless tobacco: Never Used  Substance and Sexual Activity  . Alcohol use: No    Alcohol/week: 0.0 oz  . Drug use: No  . Sexual activity: Not on file  Lifestyle  . Physical activity:    Days per week: Not on file    Minutes per session: Not on file  . Stress: Not on file  Relationships  . Social connections:    Talks on phone: Not on file    Gets together: Not on file    Attends religious service: Not on file    Active member of club or organization: Not on file    Attends meetings of clubs or organizations: Not on file    Relationship status: Not on file  . Intimate partner violence:    Fear of current or ex partner: Not on file    Emotionally abused: Not on file    Physically abused: Not on file     Forced sexual activity: Not on file  Other Topics Concern  . Not on file  Social History Narrative  . Not on file    FAMILY HISTORY: Family History  Problem Relation Age of Onset  . Heart failure Father        just received LVAD  . Heart disease Paternal Grandfather        stent placement    REVIEW OF SYSTEMS: Reviewed with the patient as per History of present illness. Psych: The patient does have severe dental phobia by report.  DENTAL HISTORY: CHIEF COMPLAINT: Patient is referred by Dr. Cornelius Moras for a dental consultation.  HPI: Theresa Crawford is a 37 year old female with recurrent mitral valve stenosis and regurgitation status post mitral valve repair. Patient with anticipated mitral valve replacement or repair with Dr. Cornelius Moras in the near future. Patient is now seen as part of a pre-heart valve surgery dental protocol examination.  Patient is currently complaining of lower right molar pain. This occurs in an intermittent fashion. Patient describes having sharp pain that previously reached an intensity of 10 out of 10 but is currently 7 out of 10 today. This has been hurting for 3 weeks.  The patient last saw the dentist in 2012 to have 2 teeth pulled. This was in [place] with an oral Careers adviser. Patient denies any complications from those dental extractions. Patient indicates that she did need to be "put to sleep" for those extractions. The patient denies having partial dentures.  Patient does have severe dental phobia by report.  DENTAL EXAMINATION: GENERAL:  The patient is a well-developed, well-nourished female in no acute distress. HEAD AND NECK:  There is no palpable neck lymphadenopathy. The patient denies acute TMJ symptoms but has bilateral clicking and maximum opening. INTRAORAL EXAM:  The patient has normal saliva. There is no evidence of oral abscess formation. DENTITION:  The patient is missing tooth numbers 18, 31, and 32. Multiple diastemas are noted. PERIODONTAL:  The  patient has chronic periodontitis with plaque and calculus accumulations,  gingival recession, and incipient to moderate bone loss. DENTAL CARIES/SUBOPTIMAL RESTORATIONS:  Dental caries are noted. ENDODONTIC:  Patient has a history of acute pulpitis symptoms associated with tooth #30. There is significant periapical radiolucency and pathology associated with the apices of tooth #30. The patient has had a previous root canal therapy associated with tooth #20. CROWN AND BRIDGE:  The patient has a PFM Crown on tooth #20. The margins appear to be suboptimal on the mesial and distal. PROSTHODONTIC:  There are no partial dentures. OCCLUSION:  The patient has a poor occlusal scheme secondary to multiple missing teeth, supra-eruption and drifting of the unopposed teeth into the edentulous areas, and lack of replacement of all missing teeth with dental prostheses  RADIOGRAPHIC INTERPRETATION: An orthopantogram was taken and supplemented with a full series of dental radiographs. There are multiple missing teeth. There is supra-eruption and drifting of the unopposed teeth into the edentulous areas. Multiple diastemas are noted.  There is periapical radiolucency and pathology associated with the apices of tooth #30. Dental caries are noted. There is a previous root canal therapy associated with tooth #20. Tooth #20 has a PFM crown with suboptimal margins.   ASSESSMENTS: 1. Recurrent mitral valve stenosis and regurgitation status post mitral valve repair 2. Chronic diastolic congestive heart failure 3. Pre-heart valve surgery dental protocol 4. History of acute pulpitis 5. Chronic apical periodontitis 6. Dental caries 7. Suboptimal dental restorations 8. Chronic periodontitis with bone loss 9. Accretions 10. Gingival recession 11. Multiple diastemas 12.  Multiple missing teeth 13. Supra-eruption and drifting of the unopposed teeth into the edentulous areas 14. Malocclusion 15. Dental phobia 16. Need  for antibiotic premedication prior to invasive dental procedures due to previous mitral valve repair.   PLAN/RECOMMENDATIONS: 1. I discussed the risks, benefits, and complications of various treatment options with the patient in relationship to her medical and dental conditions, anticipated mitral valve repair or replacement, and risk for endocarditis. We discussed various treatment options to include no treatment, extraction of indicated teeth with alveoloplasty, pre-prosthetic surgery as indicated, periodontal therapy, dental restorations, root canal therapy, crown and bridge therapy, implant therapy, and replacement of missing teeth as indicated. The patient currently wishes to proceed with extraction of indicated teeth with alveoloplasty and gross debridement of remaining dentition in the operating room with general anesthesia. This has been scheduled for 04/03/2018 at 7:30 AM at [facility] .  The patient will then follow-up with Dr. Cornelius Moraswen for anticipated heart valve surgery as indicated. Patient will need follow-up care with a primary dentist of her choice for exam, periodontal maintenance procedures, restorations, and evaluation for replacement of missing teeth as indicated once the patient is medically stable from the anticipated heart valve surgery. The patient will require antibiotic premedication prior to invasive dental procedures per American Heart Association guidelines.  2. Discussion of findings with medication of future medical and dental care as needed.  I spent in excess of 120 minutes during the conduct of this consultation and >50% of this time involved direct face-to-face encounter for counseling and/or coordination of the patient's care.    Charlynne Panderonald F. Demetris Meinhardt, DDS

## 2018-03-30 ENCOUNTER — Ambulatory Visit
Admission: RE | Admit: 2018-03-30 | Discharge: 2018-03-30 | Disposition: A | Discharge status: Home / Self Care | Payer: Medicaid Other | Source: Ambulatory Visit | Attending: Thoracic Surgery (Cardiothoracic Vascular Surgery) | Admitting: Thoracic Surgery (Cardiothoracic Vascular Surgery)

## 2018-03-30 DIAGNOSIS — I340 Nonrheumatic mitral (valve) insufficiency: Secondary | ICD-10-CM

## 2018-03-30 DIAGNOSIS — I7409 Other arterial embolism and thrombosis of abdominal aorta: Secondary | ICD-10-CM

## 2018-03-30 DIAGNOSIS — Z01818 Encounter for other preprocedural examination: Secondary | ICD-10-CM

## 2018-03-30 MED ORDER — IOPAMIDOL (ISOVUE-370) INJECTION 76%
75.0000 mL | Freq: Once | INTRAVENOUS | Status: AC | PRN
  Administered 2018-03-30: 75 mL via INTRAVENOUS

## 2018-03-31 ENCOUNTER — Telehealth: Payer: Self-pay | Admitting: Cardiology

## 2018-03-31 ENCOUNTER — Encounter (HOSPITAL_COMMUNITY)
Admission: RE | Admit: 2018-03-31 | Discharge: 2018-03-31 | Disposition: A | Discharge status: Home / Self Care | Payer: Medicaid Other | Source: Ambulatory Visit | Attending: Dentistry | Admitting: Dentistry

## 2018-03-31 ENCOUNTER — Encounter (HOSPITAL_COMMUNITY): Payer: Self-pay

## 2018-03-31 ENCOUNTER — Other Ambulatory Visit: Payer: Self-pay

## 2018-03-31 DIAGNOSIS — K0530 Chronic periodontitis, unspecified: Secondary | ICD-10-CM | POA: Diagnosis not present

## 2018-03-31 DIAGNOSIS — I340 Nonrheumatic mitral (valve) insufficiency: Secondary | ICD-10-CM | POA: Diagnosis not present

## 2018-03-31 DIAGNOSIS — Z01818 Encounter for other preprocedural examination: Secondary | ICD-10-CM | POA: Insufficient documentation

## 2018-03-31 HISTORY — DX: Gastro-esophageal reflux disease without esophagitis: K21.9

## 2018-03-31 HISTORY — DX: Depression, unspecified: F32.A

## 2018-03-31 HISTORY — DX: Major depressive disorder, single episode, unspecified: F32.9

## 2018-03-31 HISTORY — DX: Cardiac murmur, unspecified: R01.1

## 2018-03-31 HISTORY — DX: Anxiety disorder, unspecified: F41.9

## 2018-03-31 HISTORY — DX: Palpitations: R00.2

## 2018-03-31 LAB — BASIC METABOLIC PANEL
ANION GAP: 12 (ref 5–15)
BUN: 15 mg/dL (ref 6–20)
CO2: 24 mmol/L (ref 22–32)
Calcium: 8.9 mg/dL (ref 8.9–10.3)
Chloride: 104 mmol/L (ref 98–111)
Creatinine, Ser: 1.37 mg/dL — ABNORMAL HIGH (ref 0.44–1.00)
GFR calc Af Amer: 56 mL/min — ABNORMAL LOW (ref >60)
GFR calc non Af Amer: 48 mL/min — ABNORMAL LOW (ref >60)
GLUCOSE: 155 mg/dL — AB (ref 70–99)
POTASSIUM: 3.1 mmol/L — AB (ref 3.5–5.1)
Sodium: 140 mmol/L (ref 135–145)

## 2018-03-31 LAB — CBC
HEMATOCRIT: 31.9 % — AB (ref 36.0–46.0)
HEMOGLOBIN: 9.7 g/dL — AB (ref 12.0–15.0)
MCH: 24.5 pg — ABNORMAL LOW (ref 26.0–34.0)
MCHC: 30.4 g/dL (ref 30.0–36.0)
MCV: 80.6 fL (ref 78.0–100.0)
Platelets: 375 10*3/uL (ref 150–400)
RBC: 3.96 MIL/uL (ref 3.87–5.11)
RDW: 19.5 % — ABNORMAL HIGH (ref 11.5–15.5)
WBC: 18.2 10*3/uL — ABNORMAL HIGH (ref 4.0–10.5)

## 2018-03-31 NOTE — Progress Notes (Signed)
Discussed patient with preop anesthesia clinic. She reports to them some TIA like symptoms in 11/2017. Also some intermittent visual field cuts. She has appt next week in cardiology clinic, will get more history at that time, consider neuro evaluation. At this time dont' see contranidication to her oral surgery   Theresa FerryJ Oaklyn Mans MD

## 2018-03-31 NOTE — Progress Notes (Addendum)
PCP - [facility] Cardiologist - Koneswaran  Chest x-ray - 03/07/18 EKG - 03/07/18 ECHO - 01/25/18 Cardiac Cath - upcoming in August  Anesthesia review: heart history, pt states she "may have had mini stroke" in March. Pt reports she has not told any of her doctors about this. She was at home chopping vegetables in the kitchen when she experienced left arm numbness, facial droop to left side of face. Also with neck pain (unable to move neck). Pt states EMS was called and gave her 4 baby ASA. Pt states this episode lasted about 7 minutes and she has not felt this since. Pt has not told anyone about this.   Pt concerned about taking 37.5mg  Toprol XL prior to surgery d/t low BP. Pt given instructions to take metoprolol the morning of surgery as prescribed. BP today in 80s on left arm, 90s on the right arm. Pt asymptomatic. HR in 70s. Chart forwarded to anesthesia if further instruction regarding this needed.   Pt reports that she has had "partial hysterectomy" where she is unsure how much of her uterus was removed, but that she did have her left fallopian tube and left ovary removed. Pt states she does still have period. Will check Urine preg DOS.  Patient denies shortness of breath, fever, cough and chest pain at PAT appointment  Patient verbalized understanding of instructions that were given to them at the PAT appointment. Patient was also instructed that they will need to review over the PAT instructions again at home before surgery.

## 2018-03-31 NOTE — Pre-Procedure Instructions (Signed)
Theresa Crawford  03/31/2018      [facility] - [place], [place] - [address] [place] [id number] Phone: [phone] Fax: [phone]    Your procedure is scheduled on Monday July 22.  Report to [facility] Admitting at 5:30 A.M.  Call this number if you have problems the morning of surgery:  [phone]   Remember:  Do not eat or drink after midnight.    Take these medicines the morning of surgery with A SIP OF WATER:   Metoprolol (Toprol-XL) Citalopram (celexa) Albuterol if needed (please bring inhaler to hospital with you)  7 days prior to surgery STOP taking any Aspirin(unless otherwise instructed by your surgeon), Aleve, Naproxen, Ibuprofen, Motrin, Advil, Goody's, BC's, all herbal medications, fish oil, and all vitamins     Do not wear jewelry, make-up or nail polish.  Do not wear lotions, powders, or perfumes, or deodorant.  Do not shave 48 hours prior to surgery.  Men may shave face and neck.  Do not bring valuables to the hospital.  [facility] is not responsible for any belongings or valuables.  Contacts, dentures or bridgework may not be worn into surgery.  Leave your suitcase in the car.  After surgery it may be brought to your room.  For patients admitted to the hospital, discharge time will be determined by your treatment team.  Patients discharged the day of surgery will not be allowed to drive home.    Special instructions:    [facility]- Preparing For Surgery  Before surgery, you can play an important role. Because skin is not sterile, your skin needs to be as free of germs as possible. You can reduce the number of germs on your skin by washing with CHG (chlorahexidine gluconate) Soap before surgery.  CHG is an antiseptic cleaner which kills germs and bonds with the skin to continue killing germs even after washing.    Oral Hygiene is also important to reduce your risk of infection.  Remember - BRUSH YOUR TEETH  THE MORNING OF SURGERY WITH YOUR REGULAR TOOTHPASTE  Please do not use if you have an allergy to CHG or antibacterial soaps. If your skin becomes reddened/irritated stop using the CHG.  Do not shave (including legs and underarms) for at least 48 hours prior to first CHG shower. It is OK to shave your face.  Please follow these instructions carefully.   1. Shower the NIGHT BEFORE SURGERY and the MORNING OF SURGERY with CHG.   2. If you chose to wash your hair, wash your hair first as usual with your normal shampoo.  3. After you shampoo, rinse your hair and body thoroughly to remove the shampoo.  4. Use CHG as you would any other liquid soap. You can apply CHG directly to the skin and wash gently with a scrungie or a clean washcloth.   5. Apply the CHG Soap to your body ONLY FROM THE NECK DOWN.  Do not use on open wounds or open sores. Avoid contact with your eyes, ears, mouth and genitals (private parts). Wash Face and genitals (private parts)  with your normal soap.  6. Wash thoroughly, paying special attention to the area where your surgery will be performed.  7. Thoroughly rinse your body with warm water from the neck down.  8. DO NOT shower/wash with your normal soap after using and rinsing off the CHG Soap.  9. Pat yourself dry with a CLEAN TOWEL.  10. Wear CLEAN PAJAMAS to bed the night before surgery, wear comfortable clothes the morning of surgery  11. Place CLEAN SHEETS on your bed the night of your first shower and DO NOT SLEEP WITH PETS.    Day of Surgery:  Do not apply any deodorants/lotions.  Please wear clean clothes to the hospital/surgery center.   Remember to brush your teeth WITH YOUR REGULAR TOOTHPASTE.    Please read over the following fact sheets that you were given. Coughing and Deep Breathing and Surgical Site Infection Prevention

## 2018-03-31 NOTE — Progress Notes (Signed)
Anesthesia PAT Evaluation:  Case:  [id number] Date/Time:  04/03/18 0715   Procedure:  MULTIPLE EXTRACTION WITH ALVEOLOPLASTY AND GROSS DEBRIDEMENT OF REMAINING TEETH (N/A )   Anesthesia type:  General   Pre-op diagnosis:  mitral regurgitation and chronic periodontis   Location:  [facility] OR ROOM 12 / [facility] OR   Surgeon:  Charlynne Pander, DDS      DISCUSSION: Patient is a 37 year old female scheduled for the above procedure. She needs a redo mitral valve repair or replacement with possible tricuspid valve repair in the near future (not scheduled as of yet). She has been seen CT surgeon Tressie Stalker, MD.   History include mitral valve disease (severe mitral regurgitation, s/p complex mitral valve repair with chordal resection of the entire posterior leaflet; resuspension of posterior leaflet with Gore-Tex neochordae; insertion of 30 mm Carpentier-Edwards physio-annuloplasty ring 03/27/13, Dr. Darcus Austin, [facility]; moderate-severe mitral stenosis and mild MR 01/2018), moderate TR 01/2018, chronic diastolic CHF (last admission 02/05/18), PACs/PVCs, peripartum pericarditis '09, pulmonary hypertension (PA peak pressure: 67 mmHg 08/2017), Sarcoidosis (not followed by a specialist), HTN, normocytic anemia. She reported that she quit smoking ~ 1 week ago after her visit with Dr. Cornelius Moras.    There is question if she has had a TIA in the distant and/or recent past. When she first saw Dr. Purvis Sheffield in 2017 she was on Plavix for unclear reasons, but thought she may have had a prior TIA. At PAT, she thought it was for "circulation" after her mitral valve surgery. She reported intermittent visual changes in her left eye for at least the past year--she describes it as temporary visual field cuts and describes what sounds like a curtain coming over her left eye. She has not seen an ophthalmologist or neurologist for this. Most recently, she described an event in March 2019 in which she was chopping vegetable at  home and experience left arm numbness and weakness with left facial droop. She said her boyfriend also felt she was not making sense in her conversation. He called EMS who gave her 4 baby ASA and advised she go to the ED for evaluation of possible CVA/TIA. Her symptoms only lasted approximately 5-7 minutes, so declined further evaluation. She reports that she did not notify her medical providers of this event, but denied any recurrent symptoms since then.    PAT labs showed a WBC 18.2, up from 13.1-14.8 since 12/2017. However, she received 50 mg Prednisone on 03/29/18 8:30 PM, 03/30/18 2:30 AM and 8:30 AM prior to CT--so may be contributing to leukocytosis. She denied productive cough, fever, dysuria, rash, or other known sick symptoms. She has an intermittent dry cough which she tends to get if she takes deep breaths. H/H 9.7/31.91, up from 9.4/30.2 on 03/07/18. Cr 1.37, up from 0.76 on 03/08/18. She recently had a CTA on 03/30/18, so this could be contributing. Would anticipate repeat labs prior to her RHC/LHC scheduled for 04/13/18. She had normal coronaries by 2014 cath prior to first MV repair. She is scheduled to see Randall An, PA-C on 04/07/18 for hospital follow-up.  Above reviewed with anesthesiologist Dr. Phillips Grout. Would not plan repeat labs prior to dental surgery (unless new symptoms/exam findings) given recent steroids and CTA that may be contributing factors. Because she has been hypotensive, she was instructed to only take 25 mg of Toprol on the morning of surgery (instead of 37.5 mg). I did tell her that if for some reason she is symptomatic (ie, dizzy,  weak), she could hold Toprol until we are able to further assess her on the day of surgery. In regards to her previous eye symptoms and event in March, these are concerning for amaurosis fugax and/or TIA. (05/04/17 head CT was negative for CVA.) She has not seen ophthalmology, so cannot rule out etiology from the eye itself, although  intermittent eye symptoms have been present for > one year now. She denied any acute neurologic symptoms at PAT. She has had a recent TEE that did not make any mention of vegetation or PFO and had CTA of the neck and event monitor within the past year that did not identify any carotid disease or afib. She has not been on Plavix for "years" and only takes Bayer ASA intermittent--not taken in months. Because patient without acute symptoms, last event > 90 days, and has had fairly recent testing including TEE, event monitor, and carotid imaging, it is anticipated that she can proceed with dental procedure as planned from an anesthesia standpoint. Dr. Purvis SheffieldKoneswaran was not in the office this afternoon, but I did speak with his partner Dina RichJonathan Branch, MD about neurologic symptoms, hypotension (asymptomatic currently), and today's Creatinine result. He also felt that she could proceed from a cardiology standpoint. He will discuss with Dr. Purvis SheffieldKoneswaran if patient will need further neurology evaluation prior to undergoing mitral valve surgery. Hopefully more can be discussed with her at her 04/07/18 cardiology office visit. I advised patient to contact EMS for recurrent symptoms and that currently we would defer decision for further evaluation to cardiology/CT surgery. I attempted to update Dr. Kristin BruinsKulinski, but did not get him at his office or cell phone and did not get a response when paged. I will send him and Dr. Cornelius Moraswen a staff message with an update.   Anesthesiologist to re-evaluate on the day of surgery.   VS: BP (!) 99/46   Pulse 72   Temp 36.5 C   Resp 18   Ht 5\' 4"  (1.626 m)   Wt 217 lb (98.4 kg)   LMP 03/15/2018   SpO2 99%   BMI 37.25 kg/m  Per PAT RN, SBP 80's LUE, 90's RUE. Reports home BP readings ~ 90/50's - ~ 105/60's.  BP 106/64 03/20/18 and 105/50 03/28/18 at her last office visits. She is taking Toprol XL 1 1/2 tablet daily and torsemide 20 mg BID, except on days that she may feel weak or light headed,  she may only take 1/2-1 tablet of her Toprol.  Heart RRR. Grade I/VI murmur. Lung clear. No ankle edema. She reports that breathing and weights have been stable. BMI is consistent with obesity. Speech clear. Steady gait. Denied unilateral weakness. Hand grips strong, equal. No pronator drift.    PROVIDERS: House, Eugenio HoesKaren A, [name] is PCP at [facility].  Prentice DockerKoneswaran, Suresh, MD is cardiologist [facility] First established in 2017.   LABS: Labs reviewed: Acceptable for surgery. K 3.1, although she is on a KCl supplement. Her Creatinine is elevated at 1.37, previously 0.76. I notified patient and covering cardiologist Dr. Wyline MoodBranch.  (all labs ordered are listed, but only abnormal results are displayed)  Labs Reviewed  CBC - Abnormal; Notable for the following components:      Result Value   WBC 18.2 (*)    Hemoglobin 9.7 (*)    HCT 31.9 (*)    MCH 24.5 (*)    RDW 19.5 (*)    All other components within normal limits  BASIC METABOLIC PANEL  IMAGES: CTA chest 03/30/18: IMPRESSION: - Diffuse ground-glass opacities of the bilateral lungs, are relatively improved when compared to the prior CT of 01/11/2018 and 05/23/2016. Given there is only mild interlobular septal thickening and pleural thickening, the findings are favored to represent a combination of improved/mild pulmonary edema, as well as sequela of the patient's known sarcoidosis. Atypical pneumonia cannot be excluded, though is not favored. - Redemonstration of mediastinal lymphadenopathy, unchanged from comparison studies, again most likely relating to the patient's known sarcoidosis. - Surgical changes of median sternotomy and mitral valve annuloplasty. Note that the right atrium and proximal pulmonary outflow tract are immediately subjacent to the sternal wires, as above.  CXR 03/07/18:  IMPRESSION: Cardiomegaly with mild vascular congestion. Pneumonia is not excluded. Clinical  correlation is recommended.  CTA head/neck 05/04/17 (for neck pain, LUE weakness/tingling): IMPRESSION: 1. Normal CTA of the head and neck. 2. Moderate biapical pulmonary edema.   EKG: EKG 03/07/18: SR, probable LAE. Non-specific T wave abnormality, lateral leads. Borderline prolonged QT interval (QT 385 ms, QTc 487 ms).   CV: TEE 01/25/18: Study Conclusions - Left ventricle: Systolic function was normal. The estimated   ejection fraction was in the range of 55% to 60%. Wall motion was   normal; there were no regional wall motion abnormalities. - Aorta: Mild atheroschlerotic plaque noted within the aortic arch. - Mitral valve: Moderately thickened leaflets with mild tethering   of the anterior leaflet and restricted motion of the posterior   leaflet. Chordae appear thickened and shortened as well. Annular   ring present. The findings are consistent with moderate to severe   stenosis. There was mild regurgitation directed eccentrically.   Mean gradient (D): 9 mm Hg. Planimetered valve area: 1.39 cm^2. - Left atrium: The atrium was dilated. There was moderatecontinuous   spontaneous echo contrast (&quot;smoke&quot;) in the appendage. Emptying   velocity was severely reduced. - Right ventricle: The cavity size was mildly dilated. - Right atrium: The atrium was dilated. - Atrial septum: No defect or patent foramen ovale was identified.   Few isolated bubbles noted in left atrium after 4-5 beats. - Tricuspid valve: There was moderate regurgitation. Peak RV-RA   gradient (S): 52 mm Hg. - Pulmonary arteries: Systolic pressure was not measured directly,   but based on RVSP is moderately to severely increased. - Pericardium, extracardiac: There was no pericardial effusion.  Cardiac Event Monitor 03/31/17-04/20/17:  Min HR 59, Max HR 158, Avg HR 90  Reported symptoms correspond with sinus rhythm with occasional PACs and PVCs  No significant arrhythmias  LHC 02/08/13 [facility] Everywhere): Findings:  Left main: Normal  Left anterior descending: Normal  Diagonals: Normal  Circumflex: Normal  Obtuse marginals: Normal  Right coronary artery: Normal  Rightdominant system  Elevated LVEDP   Past Medical History:  Diagnosis Date  . Anxiety   . Chronic diastolic CHF (congestive heart failure) (HCC)   . Chronic diastolic congestive heart failure (HCC)   . Depression   . GERD (gastroesophageal reflux disease)   . Heart murmur   . History of open heart surgery   . Hypertension   . Mitral valve disease   . Normocytic anemia   . Obesity   . Palpitations   . Pericarditis   . Premature atrial contractions   . Pulmonary hypertension (HCC)   . PVC's (premature ventricular contractions)    a. h/o palpitations with event monitor in 03/2017 showing NSR with PACs/PVCs.  . Recurrent mitral valve stenosis and regurgitation s/p  mitral valve repair   . S/P mitral valve repair 03/2013   [place], [place]  . Sarcoidosis   . Tobacco abuse   . Tricuspid regurgitation     Past Surgical History:  Procedure Laterality Date  . CESAREAN SECTION     X's 2  . MITRAL VALVE REPAIR  03/26/2013    [facility] in [place], [place]   . PARTIAL HYSTERECTOMY  2011   PID w/problem with fallopian tubes, had left fallopian and left ovary removed  . TEE WITHOUT CARDIOVERSION N/A 05/26/2016   Procedure: TRANSESOPHAGEAL ECHOCARDIOGRAM (TEE);  Surgeon: [name], MD;  Location: [facility];  Service: Cardiovascular;  Laterality: N/A;  . TEE WITHOUT CARDIOVERSION N/A 01/25/2018   Procedure: TRANSESOPHAGEAL ECHOCARDIOGRAM (TEE) WITH PROPOFOL;  Surgeon: Jonelle Sidle, MD;  Location: [facility] SUITE;  Service: Cardiovascular;  Laterality: N/A;    MEDICATIONS: . albuterol (PROVENTIL HFA;VENTOLIN HFA) 108 (90 Base) MCG/ACT inhaler  . citalopram (CELEXA) 40 MG tablet  . docusate sodium (COLACE) 100 MG capsule  . EPINEPHrine (EPIPEN 2-PAK) 0.3 mg/0.3 mL IJ  SOAJ injection  . ferrous sulfate 325 (65 FE) MG tablet  . metoprolol succinate (TOPROL-XL) 25 MG 24 hr tablet  . potassium chloride SA (K-DUR,KLOR-CON) 20 MEQ tablet  . Probiotic Product (PROBIOTIC PO)  . QUEtiapine (SEROQUEL) 25 MG tablet  . torsemide (DEMADEX) 20 MG tablet   No current facility-administered medications for this encounter.    Velna Ochs [facility]/Anesthesiology Phone [phone] 03/31/2018 5:18 PM

## 2018-04-02 NOTE — Anesthesia Preprocedure Evaluation (Addendum)
Anesthesia Evaluation  Patient identified by MRN, date of birth, ID band Patient awake    Reviewed: Allergy & Precautions, H&P , NPO status , Patient's Chart, lab work & pertinent test results  Airway Mallampati: I  TM Distance: >3 FB Neck ROM: full    Dental no notable dental hx. (+) Poor Dentition, Dental Advidsory Given   Pulmonary shortness of breath, pneumonia, Current Smoker,    Pulmonary exam normal breath sounds clear to auscultation       Cardiovascular Exercise Tolerance: Poor hypertension, Pt. on medications +CHF  + Valvular Problems/Murmurs MR  Rhythm:regular Rate:Normal  EKG: EKG 03/07/18: SR, probable LAE. Non-specific T wave abnormality, lateral leads. Borderline prolonged QT interval (QT 385 ms, QTc 487 ms).  TEE 01/25/18: Study Conclusions - Left ventricle: Systolic function was normal. The estimated ejection fraction was in the range of 55% to 60%. Wall motion was normal; there were no regional wall motion abnormalities. - Mitral valve: Moderately thickened leaflets with mild tethering of the anterior leaflet and restricted motion of the posterior leaflet. Chordae appear thickened and shortened as well. Annular ring present. The findings are consistent with moderate to severe stenosis. There was mild regurgitation directed eccentrically. Mean gradient (D): 9 mm Hg. Planimetered valve area: 1.39 cm^2. - Left atrium: The atrium was dilated. There was moderatecontinuous spontaneous echo contrast Emptying velocity was severely reduced. - Right ventricle: The cavity size was mildly dilated. - Right atrium: The atrium was dilated. - Atrial septum: No defect or patent foramen ovale was identified. Few isolated bubbles noted in left atrium after 4-5 beats. - Tricuspid valve: There was moderate regurgitation. Peak RV-RA gradient (S): 52 mm Hg. - Pulmonary arteries: Systolic pressure was not  measured directly, but based on RVSP is moderately to severely increased. - Cardiac Event Monitor 03/31/17-04/20/17:  Min HR 59, Max HR 158, Avg HR 90  No significant arrhythmias  LHC 02/08/13  Findings:  Left main: Normal  Left anterior descending: Normal  Diagonals: Normal  Circumflex: Normal  Obtuse marginals: Normal  Right coronary artery: Normal  Rightdominant system  Elevated LVEDP     Neuro/Psych PSYCHIATRIC DISORDERS Anxiety Depression    GI/Hepatic GERD  ,  Endo/Other    Renal/GU      Musculoskeletal   Abdominal   Peds  Hematology  (+) anemia ,   Anesthesia Other Findings Pulmonary hypertension (HCC) Mitral valve stenosis  Reproductive/Obstetrics                            Anesthesia Physical  Anesthesia Plan  ASA: III  Anesthesia Plan: General   Post-op Pain Management:    Induction: Intravenous  PONV Risk Score and Plan: 2 and Ondansetron and Treatment may vary due to age or medical condition  Airway Management Planned: Oral ETT  Additional Equipment:   Intra-op Plan:   Post-operative Plan: Extubation in OR  Informed Consent: I have reviewed the patients History and Physical, chart, labs and discussed the procedure including the risks, benefits and alternatives for the proposed anesthesia with the patient or authorized representative who has indicated his/her understanding and acceptance.   Dental Advisory Given  Plan Discussed with: CRNA, Anesthesiologist and Surgeon  Anesthesia Plan Comments: (  )       Anesthesia Quick Evaluation

## 2018-04-03 ENCOUNTER — Inpatient Hospital Stay (HOSPITAL_COMMUNITY)
Admission: RE | Admit: 2018-04-03 | Discharge: 2018-04-05 | DRG: 129 | Disposition: A | Discharge status: Other Acute Inpatient Hospital | Payer: Medicaid Other | Attending: Internal Medicine | Admitting: Internal Medicine

## 2018-04-03 ENCOUNTER — Ambulatory Visit (HOSPITAL_COMMUNITY): Payer: Medicaid Other | Admitting: Vascular Surgery

## 2018-04-03 ENCOUNTER — Ambulatory Visit (HOSPITAL_COMMUNITY): Payer: Medicaid Other | Admitting: Anesthesiology

## 2018-04-03 ENCOUNTER — Telehealth (HOSPITAL_COMMUNITY): Payer: Self-pay | Admitting: Dentistry

## 2018-04-03 ENCOUNTER — Encounter (HOSPITAL_COMMUNITY): Payer: Self-pay | Admitting: *Deleted

## 2018-04-03 ENCOUNTER — Encounter (HOSPITAL_COMMUNITY)
Admission: RE | Disposition: A | Discharge status: Other Acute Inpatient Hospital | Payer: Self-pay | Source: Home / Self Care | Attending: Internal Medicine

## 2018-04-03 ENCOUNTER — Other Ambulatory Visit: Payer: Self-pay

## 2018-04-03 DIAGNOSIS — F410 Panic disorder [episodic paroxysmal anxiety] without agoraphobia: Secondary | ICD-10-CM | POA: Diagnosis not present

## 2018-04-03 DIAGNOSIS — F418 Other specified anxiety disorders: Secondary | ICD-10-CM

## 2018-04-03 DIAGNOSIS — K083 Retained dental root: Secondary | ICD-10-CM | POA: Diagnosis present

## 2018-04-03 DIAGNOSIS — Z888 Allergy status to other drugs, medicaments and biological substances status: Secondary | ICD-10-CM

## 2018-04-03 DIAGNOSIS — I5032 Chronic diastolic (congestive) heart failure: Secondary | ICD-10-CM | POA: Diagnosis not present

## 2018-04-03 DIAGNOSIS — J449 Chronic obstructive pulmonary disease, unspecified: Secondary | ICD-10-CM | POA: Diagnosis present

## 2018-04-03 DIAGNOSIS — K0530 Chronic periodontitis, unspecified: Secondary | ICD-10-CM

## 2018-04-03 DIAGNOSIS — Z818 Family history of other mental and behavioral disorders: Secondary | ICD-10-CM | POA: Diagnosis not present

## 2018-04-03 DIAGNOSIS — R45851 Suicidal ideations: Secondary | ICD-10-CM | POA: Diagnosis not present

## 2018-04-03 DIAGNOSIS — K045 Chronic apical periodontitis: Principal | ICD-10-CM | POA: Diagnosis present

## 2018-04-03 DIAGNOSIS — D649 Anemia, unspecified: Secondary | ICD-10-CM | POA: Diagnosis not present

## 2018-04-03 DIAGNOSIS — K219 Gastro-esophageal reflux disease without esophagitis: Secondary | ICD-10-CM | POA: Diagnosis present

## 2018-04-03 DIAGNOSIS — Z9079 Acquired absence of other genital organ(s): Secondary | ICD-10-CM

## 2018-04-03 DIAGNOSIS — F17210 Nicotine dependence, cigarettes, uncomplicated: Secondary | ICD-10-CM | POA: Diagnosis not present

## 2018-04-03 DIAGNOSIS — G4700 Insomnia, unspecified: Secondary | ICD-10-CM | POA: Diagnosis present

## 2018-04-03 DIAGNOSIS — I10 Essential (primary) hypertension: Secondary | ICD-10-CM | POA: Diagnosis present

## 2018-04-03 DIAGNOSIS — D509 Iron deficiency anemia, unspecified: Secondary | ICD-10-CM | POA: Diagnosis not present

## 2018-04-03 DIAGNOSIS — Z90711 Acquired absence of uterus with remaining cervical stump: Secondary | ICD-10-CM

## 2018-04-03 DIAGNOSIS — F419 Anxiety disorder, unspecified: Secondary | ICD-10-CM | POA: Diagnosis not present

## 2018-04-03 DIAGNOSIS — Z8249 Family history of ischemic heart disease and other diseases of the circulatory system: Secondary | ICD-10-CM

## 2018-04-03 DIAGNOSIS — Z880 Allergy status to penicillin: Secondary | ICD-10-CM

## 2018-04-03 DIAGNOSIS — R05 Cough: Secondary | ICD-10-CM | POA: Diagnosis present

## 2018-04-03 DIAGNOSIS — K0401 Reversible pulpitis: Secondary | ICD-10-CM | POA: Diagnosis not present

## 2018-04-03 DIAGNOSIS — I110 Hypertensive heart disease with heart failure: Secondary | ICD-10-CM | POA: Diagnosis present

## 2018-04-03 DIAGNOSIS — E669 Obesity, unspecified: Secondary | ICD-10-CM | POA: Diagnosis present

## 2018-04-03 DIAGNOSIS — D869 Sarcoidosis, unspecified: Secondary | ICD-10-CM | POA: Diagnosis present

## 2018-04-03 DIAGNOSIS — K036 Deposits [accretions] on teeth: Secondary | ICD-10-CM

## 2018-04-03 DIAGNOSIS — Z91041 Radiographic dye allergy status: Secondary | ICD-10-CM

## 2018-04-03 DIAGNOSIS — F322 Major depressive disorder, single episode, severe without psychotic features: Secondary | ICD-10-CM | POA: Diagnosis not present

## 2018-04-03 DIAGNOSIS — Z90721 Acquired absence of ovaries, unilateral: Secondary | ICD-10-CM | POA: Diagnosis not present

## 2018-04-03 DIAGNOSIS — Z98818 Other dental procedure status: Secondary | ICD-10-CM | POA: Diagnosis not present

## 2018-04-03 DIAGNOSIS — K029 Dental caries, unspecified: Secondary | ICD-10-CM | POA: Diagnosis present

## 2018-04-03 DIAGNOSIS — I081 Rheumatic disorders of both mitral and tricuspid valves: Secondary | ICD-10-CM | POA: Diagnosis present

## 2018-04-03 DIAGNOSIS — E876 Hypokalemia: Secondary | ICD-10-CM | POA: Diagnosis present

## 2018-04-03 DIAGNOSIS — Z915 Personal history of self-harm: Secondary | ICD-10-CM | POA: Diagnosis not present

## 2018-04-03 DIAGNOSIS — Z6839 Body mass index (BMI) 39.0-39.9, adult: Secondary | ICD-10-CM

## 2018-04-03 DIAGNOSIS — F329 Major depressive disorder, single episode, unspecified: Secondary | ICD-10-CM | POA: Diagnosis not present

## 2018-04-03 DIAGNOSIS — Z91013 Allergy to seafood: Secondary | ICD-10-CM

## 2018-04-03 DIAGNOSIS — I491 Atrial premature depolarization: Secondary | ICD-10-CM | POA: Diagnosis present

## 2018-04-03 DIAGNOSIS — F411 Generalized anxiety disorder: Secondary | ICD-10-CM | POA: Diagnosis not present

## 2018-04-03 DIAGNOSIS — Z62810 Personal history of physical and sexual abuse in childhood: Secondary | ICD-10-CM | POA: Diagnosis not present

## 2018-04-03 DIAGNOSIS — Z01818 Encounter for other preprocedural examination: Secondary | ICD-10-CM

## 2018-04-03 DIAGNOSIS — K08409 Partial loss of teeth, unspecified cause, unspecified class: Secondary | ICD-10-CM

## 2018-04-03 DIAGNOSIS — I052 Rheumatic mitral stenosis with insufficiency: Secondary | ICD-10-CM | POA: Diagnosis not present

## 2018-04-03 DIAGNOSIS — Z62811 Personal history of psychological abuse in childhood: Secondary | ICD-10-CM | POA: Diagnosis not present

## 2018-04-03 DIAGNOSIS — F333 Major depressive disorder, recurrent, severe with psychotic symptoms: Secondary | ICD-10-CM | POA: Diagnosis not present

## 2018-04-03 DIAGNOSIS — D638 Anemia in other chronic diseases classified elsewhere: Secondary | ICD-10-CM | POA: Diagnosis present

## 2018-04-03 DIAGNOSIS — R9431 Abnormal electrocardiogram [ECG] [EKG]: Secondary | ICD-10-CM | POA: Diagnosis present

## 2018-04-03 DIAGNOSIS — Z91030 Bee allergy status: Secondary | ICD-10-CM

## 2018-04-03 DIAGNOSIS — J45909 Unspecified asthma, uncomplicated: Secondary | ICD-10-CM | POA: Diagnosis not present

## 2018-04-03 DIAGNOSIS — F332 Major depressive disorder, recurrent severe without psychotic features: Secondary | ICD-10-CM | POA: Diagnosis not present

## 2018-04-03 DIAGNOSIS — Z79899 Other long term (current) drug therapy: Secondary | ICD-10-CM

## 2018-04-03 DIAGNOSIS — Z813 Family history of other psychoactive substance abuse and dependence: Secondary | ICD-10-CM | POA: Diagnosis not present

## 2018-04-03 HISTORY — DX: Low back pain, unspecified: M54.50

## 2018-04-03 HISTORY — DX: Headache: R51

## 2018-04-03 HISTORY — PX: MULTIPLE EXTRACTIONS WITH ALVEOLOPLASTY: SHX5342

## 2018-04-03 HISTORY — DX: Low back pain: M54.5

## 2018-04-03 HISTORY — DX: Unspecified osteoarthritis, unspecified site: M19.90

## 2018-04-03 HISTORY — DX: Other intervertebral disc degeneration, lumbar region without mention of lumbar back pain or lower extremity pain: M51.369

## 2018-04-03 HISTORY — DX: Headache, unspecified: R51.9

## 2018-04-03 HISTORY — DX: Other intervertebral disc degeneration, lumbar region: M51.36

## 2018-04-03 HISTORY — DX: Unspecified chronic bronchitis: J42

## 2018-04-03 HISTORY — DX: Other chronic pain: G89.29

## 2018-04-03 LAB — POCT PREGNANCY, URINE: Preg Test, Ur: NEGATIVE

## 2018-04-03 SURGERY — MULTIPLE EXTRACTION WITH ALVEOLOPLASTY
Anesthesia: General

## 2018-04-03 MED ORDER — MIDAZOLAM HCL 5 MG/5ML IJ SOLN
INTRAMUSCULAR | Status: DC | PRN
  Administered 2018-04-03: 2 mg via INTRAVENOUS

## 2018-04-03 MED ORDER — BUPIVACAINE-EPINEPHRINE (PF) 0.5% -1:200000 IJ SOLN
INTRAMUSCULAR | Status: AC
  Filled 2018-04-03: qty 3.6

## 2018-04-03 MED ORDER — ACETAMINOPHEN 325 MG PO TABS: 325.0000 mg | ORAL_TABLET | ORAL | Status: DC | PRN

## 2018-04-03 MED ORDER — POTASSIUM CHLORIDE CRYS ER 20 MEQ PO TBCR
40.0000 meq | EXTENDED_RELEASE_TABLET | Freq: Every day | ORAL | Status: DC
  Administered 2018-04-03 – 2018-04-04 (×2): 40 meq via ORAL
  Filled 2018-04-03 (×2): qty 2

## 2018-04-03 MED ORDER — MEPERIDINE HCL 50 MG/ML IJ SOLN: 6.2500 mg | INTRAMUSCULAR | Status: DC | PRN

## 2018-04-03 MED ORDER — SODIUM CHLORIDE 0.9 % IV SOLN
INTRAVENOUS | Status: DC
  Administered 2018-04-04: 13:00:00 via INTRAVENOUS

## 2018-04-03 MED ORDER — CLINDAMYCIN PHOSPHATE 600 MG/50ML IV SOLN
600.0000 mg | Freq: Once | INTRAVENOUS | Status: AC
  Administered 2018-04-03: 600 mg via INTRAVENOUS
  Filled 2018-04-03: qty 50

## 2018-04-03 MED ORDER — OXYCODONE HCL 5 MG PO TABS: 5.0000 mg | ORAL_TABLET | Freq: Once | ORAL | Status: DC | PRN

## 2018-04-03 MED ORDER — BISACODYL 10 MG RE SUPP: 10.0000 mg | Freq: Every day | RECTAL | Status: DC | PRN

## 2018-04-03 MED ORDER — ONDANSETRON HCL 4 MG/2ML IJ SOLN: 4.0000 mg | Freq: Four times a day (QID) | INTRAMUSCULAR | Status: DC | PRN

## 2018-04-03 MED ORDER — LIDOCAINE-EPINEPHRINE 2 %-1:100000 IJ SOLN
INTRAMUSCULAR | Status: DC | PRN
  Administered 2018-04-03 (×2): 1.7 mL via INTRADERMAL

## 2018-04-03 MED ORDER — ONDANSETRON HCL 4 MG PO TABS: 4.0000 mg | ORAL_TABLET | Freq: Four times a day (QID) | ORAL | Status: DC | PRN

## 2018-04-03 MED ORDER — SUGAMMADEX SODIUM 500 MG/5ML IV SOLN
INTRAVENOUS | Status: AC
  Filled 2018-04-03: qty 5

## 2018-04-03 MED ORDER — FENTANYL CITRATE (PF) 100 MCG/2ML IJ SOLN: 25.0000 ug | INTRAMUSCULAR | Status: DC | PRN

## 2018-04-03 MED ORDER — FERROUS SULFATE 325 (65 FE) MG PO TABS
325.0000 mg | ORAL_TABLET | Freq: Every day | ORAL | Status: DC
  Administered 2018-04-04 – 2018-04-05 (×2): 325 mg via ORAL
  Filled 2018-04-03 (×2): qty 1

## 2018-04-03 MED ORDER — DOCUSATE SODIUM 100 MG PO CAPS: 100.0000 mg | ORAL_CAPSULE | Freq: Every day | ORAL | Status: DC | PRN

## 2018-04-03 MED ORDER — KETOROLAC TROMETHAMINE 15 MG/ML IJ SOLN
15.0000 mg | Freq: Four times a day (QID) | INTRAMUSCULAR | Status: DC | PRN
  Administered 2018-04-03 – 2018-04-04 (×3): 15 mg via INTRAVENOUS
  Filled 2018-04-03 (×2): qty 1

## 2018-04-03 MED ORDER — OXYCODONE HCL 5 MG/5ML PO SOLN: 5.0000 mg | Freq: Once | ORAL | Status: DC | PRN

## 2018-04-03 MED ORDER — MIDAZOLAM HCL 2 MG/2ML IJ SOLN
INTRAMUSCULAR | Status: AC
  Filled 2018-04-03: qty 2

## 2018-04-03 MED ORDER — PHENYLEPHRINE HCL 10 MG/ML IJ SOLN
INTRAMUSCULAR | Status: DC | PRN
  Administered 2018-04-03 (×2): 80 ug via INTRAVENOUS

## 2018-04-03 MED ORDER — PROPOFOL 10 MG/ML IV BOLUS
INTRAVENOUS | Status: DC | PRN
Start: 2018-04-03 — End: 2018-04-03
  Administered 2018-04-03: 130 mg via INTRAVENOUS

## 2018-04-03 MED ORDER — CLINDAMYCIN PHOSPHATE 600 MG/50ML IV SOLN
600.0000 mg | Freq: Once | INTRAVENOUS | Status: AC
  Administered 2018-04-03: 600 mg via INTRAVENOUS

## 2018-04-03 MED ORDER — DEXAMETHASONE SODIUM PHOSPHATE 10 MG/ML IJ SOLN
INTRAMUSCULAR | Status: DC | PRN
  Administered 2018-04-03: 10 mg via INTRAVENOUS

## 2018-04-03 MED ORDER — HYDROCODONE-ACETAMINOPHEN 5-325 MG PO TABS
1.0000 | ORAL_TABLET | ORAL | Status: DC | PRN
  Administered 2018-04-03: 2 via ORAL

## 2018-04-03 MED ORDER — OXYMETAZOLINE HCL 0.05 % NA SOLN
NASAL | Status: DC | PRN
  Administered 2018-04-03: 1

## 2018-04-03 MED ORDER — CLINDAMYCIN PHOSPHATE 600 MG/50ML IV SOLN
INTRAVENOUS | Status: AC
  Filled 2018-04-03: qty 50

## 2018-04-03 MED ORDER — SUGAMMADEX SODIUM 200 MG/2ML IV SOLN
INTRAVENOUS | Status: DC | PRN
  Administered 2018-04-03: 400 mg via INTRAVENOUS

## 2018-04-03 MED ORDER — BUPIVACAINE-EPINEPHRINE 0.5% -1:200000 IJ SOLN
INTRAMUSCULAR | Status: DC | PRN
  Administered 2018-04-03: 1.8 mL

## 2018-04-03 MED ORDER — ACETAMINOPHEN 160 MG/5ML PO SOLN: 325.0000 mg | ORAL | Status: DC | PRN

## 2018-04-03 MED ORDER — TORSEMIDE 20 MG PO TABS
20.0000 mg | ORAL_TABLET | Freq: Two times a day (BID) | ORAL | Status: DC
  Administered 2018-04-04 – 2018-04-05 (×3): 20 mg via ORAL
  Filled 2018-04-03 (×3): qty 1

## 2018-04-03 MED ORDER — HYDROCODONE-ACETAMINOPHEN 5-325 MG PO TABS
ORAL_TABLET | ORAL | Status: AC
  Filled 2018-04-03: qty 2

## 2018-04-03 MED ORDER — PROPOFOL 10 MG/ML IV BOLUS
INTRAVENOUS | Status: AC
  Filled 2018-04-03: qty 20

## 2018-04-03 MED ORDER — FENTANYL CITRATE (PF) 100 MCG/2ML IJ SOLN
INTRAMUSCULAR | Status: DC | PRN
  Administered 2018-04-03: 50 ug via INTRAVENOUS

## 2018-04-03 MED ORDER — LIDOCAINE-EPINEPHRINE 2 %-1:100000 IJ SOLN
INTRAMUSCULAR | Status: AC
  Filled 2018-04-03: qty 10.2

## 2018-04-03 MED ORDER — RISAQUAD PO CAPS
1.0000 | ORAL_CAPSULE | Freq: Every day | ORAL | Status: DC
  Administered 2018-04-03 – 2018-04-05 (×3): 1 via ORAL
  Filled 2018-04-03 (×3): qty 1

## 2018-04-03 MED ORDER — FENTANYL CITRATE (PF) 100 MCG/2ML IJ SOLN
INTRAMUSCULAR | Status: AC
  Filled 2018-04-03: qty 2

## 2018-04-03 MED ORDER — 0.9 % SODIUM CHLORIDE (POUR BTL) OPTIME
TOPICAL | Status: DC | PRN
  Administered 2018-04-03: 1000 mL

## 2018-04-03 MED ORDER — CITALOPRAM HYDROBROMIDE 20 MG PO TABS
40.0000 mg | ORAL_TABLET | Freq: Two times a day (BID) | ORAL | Status: DC
  Administered 2018-04-03 – 2018-04-05 (×4): 40 mg via ORAL
  Filled 2018-04-03 (×4): qty 2
  Filled 2018-04-03 (×2): qty 1

## 2018-04-03 MED ORDER — QUETIAPINE FUMARATE 25 MG PO TABS
25.0000 mg | ORAL_TABLET | Freq: Every day | ORAL | Status: DC
  Administered 2018-04-03 – 2018-04-04 (×2): 25 mg via ORAL
  Filled 2018-04-03 (×3): qty 1

## 2018-04-03 MED ORDER — FENTANYL CITRATE (PF) 250 MCG/5ML IJ SOLN
INTRAMUSCULAR | Status: AC
  Filled 2018-04-03: qty 5

## 2018-04-03 MED ORDER — SODIUM CHLORIDE 0.9 % IV SOLN
INTRAVENOUS | Status: DC | PRN
  Administered 2018-04-03: 50 ug/min via INTRAVENOUS

## 2018-04-03 MED ORDER — ALBUTEROL SULFATE (2.5 MG/3ML) 0.083% IN NEBU
2.5000 mg | INHALATION_SOLUTION | Freq: Four times a day (QID) | RESPIRATORY_TRACT | Status: DC | PRN
Start: 2018-04-03 — End: 2018-04-05

## 2018-04-03 MED ORDER — ROCURONIUM BROMIDE 100 MG/10ML IV SOLN
INTRAVENOUS | Status: DC | PRN
  Administered 2018-04-03: 50 mg via INTRAVENOUS

## 2018-04-03 MED ORDER — SODIUM CHLORIDE 0.9 % IV SOLN
INTRAVENOUS | Status: DC | PRN
  Administered 2018-04-03 (×2): via INTRAVENOUS

## 2018-04-03 MED ORDER — HYDROCODONE-ACETAMINOPHEN 5-325 MG PO TABS
1.0000 | ORAL_TABLET | Freq: Four times a day (QID) | ORAL | Status: DC | PRN
  Administered 2018-04-04: 2 via ORAL
  Administered 2018-04-04 – 2018-04-05 (×2): 1 via ORAL
  Filled 2018-04-03: qty 1
  Filled 2018-04-03 (×2): qty 2
  Filled 2018-04-03: qty 1
  Filled 2018-04-03: qty 2

## 2018-04-03 MED ORDER — SENNOSIDES-DOCUSATE SODIUM 8.6-50 MG PO TABS: 1.0000 | ORAL_TABLET | Freq: Every evening | ORAL | Status: DC | PRN

## 2018-04-03 MED ORDER — OXYMETAZOLINE HCL 0.05 % NA SOLN
NASAL | Status: AC
  Filled 2018-04-03: qty 15

## 2018-04-03 MED ORDER — CLINDAMYCIN HCL 300 MG PO CAPS: ORAL_CAPSULE | ORAL | 0 refills | Status: DC

## 2018-04-03 MED ORDER — SODIUM CHLORIDE 0.9 % IV SOLN
INTRAVENOUS | Status: DC
  Administered 2018-04-03: via INTRAVENOUS

## 2018-04-03 MED ORDER — KETOROLAC TROMETHAMINE 15 MG/ML IJ SOLN
INTRAMUSCULAR | Status: AC
  Administered 2018-04-03: 15 mg via INTRAVENOUS
  Filled 2018-04-03: qty 1

## 2018-04-03 MED ORDER — LIDOCAINE HCL (CARDIAC) PF 100 MG/5ML IV SOSY
PREFILLED_SYRINGE | INTRAVENOUS | Status: DC | PRN
  Administered 2018-04-03: 40 mg via INTRAVENOUS

## 2018-04-03 MED ORDER — ONDANSETRON HCL 4 MG/2ML IJ SOLN
INTRAMUSCULAR | Status: DC | PRN
  Administered 2018-04-03: 4 mg via INTRAVENOUS

## 2018-04-03 MED ORDER — CLINDAMYCIN HCL 300 MG PO CAPS
600.0000 mg | ORAL_CAPSULE | Freq: Three times a day (TID) | ORAL | Status: DC
  Administered 2018-04-03 – 2018-04-04 (×2): 600 mg via ORAL
  Filled 2018-04-03 (×3): qty 2

## 2018-04-03 MED ORDER — FENTANYL CITRATE (PF) 100 MCG/2ML IJ SOLN
25.0000 ug | INTRAMUSCULAR | Status: DC | PRN
  Administered 2018-04-03: 50 ug via INTRAVENOUS
  Administered 2018-04-03 (×2): 25 ug via INTRAVENOUS

## 2018-04-03 MED ORDER — HEMOSTATIC AGENTS (NO CHARGE) OPTIME
TOPICAL | Status: DC | PRN
  Administered 2018-04-03: 1 via TOPICAL

## 2018-04-03 SURGICAL SUPPLY — 37 items
ALCOHOL 70% 16 OZ (MISCELLANEOUS) ×2 IMPLANT
ATTRACTOMAT 16X20 MAGNETIC DRP (DRAPES) ×2 IMPLANT
BANDAGE HEMOSTAT MRDH 4X4 STRL (MISCELLANEOUS) IMPLANT
BLADE SURG 15 STRL LF DISP TIS (BLADE) ×2 IMPLANT
BLADE SURG 15 STRL SS (BLADE) ×2
BNDG HEMOSTAT MRDH 4X4 STRL (MISCELLANEOUS)
COVER SURGICAL LIGHT HANDLE (MISCELLANEOUS) ×2 IMPLANT
GAUZE PACKING FOLDED 2  STR (GAUZE/BANDAGES/DRESSINGS) ×1
GAUZE PACKING FOLDED 2 STR (GAUZE/BANDAGES/DRESSINGS) ×1 IMPLANT
GAUZE SPONGE 4X4 16PLY XRAY LF (GAUZE/BANDAGES/DRESSINGS) ×2 IMPLANT
GLOVE BIO SURGEON STRL SZ 6.5 (GLOVE) ×2 IMPLANT
GLOVE SURG ORTHO 8.0 STRL STRW (GLOVE) ×2 IMPLANT
GOWN STRL REUS W/ TWL LRG LVL3 (GOWN DISPOSABLE) ×1 IMPLANT
GOWN STRL REUS W/TWL 2XL LVL3 (GOWN DISPOSABLE) ×2 IMPLANT
GOWN STRL REUS W/TWL LRG LVL3 (GOWN DISPOSABLE) ×1
HEMOSTAT SURGICEL 2X14 (HEMOSTASIS) IMPLANT
KIT BASIN OR (CUSTOM PROCEDURE TRAY) ×2 IMPLANT
KIT TURNOVER KIT B (KITS) ×2 IMPLANT
MANIFOLD NEPTUNE WASTE (CANNULA) ×2 IMPLANT
NEEDLE BLUNT 16X1.5 OR ONLY (NEEDLE) ×2 IMPLANT
NEEDLE DENTAL 27 LONG (NEEDLE) ×4 IMPLANT
NS IRRIG 1000ML POUR BTL (IV SOLUTION) ×2 IMPLANT
PACK EENT II TURBAN DRAPE (CUSTOM PROCEDURE TRAY) ×2 IMPLANT
PAD ARMBOARD 7.5X6 YLW CONV (MISCELLANEOUS) ×2 IMPLANT
SPONGE SURGIFOAM ABS GEL 100 (HEMOSTASIS) IMPLANT
SPONGE SURGIFOAM ABS GEL 12-7 (HEMOSTASIS) ×2 IMPLANT
SPONGE SURGIFOAM ABS GEL SZ50 (HEMOSTASIS) IMPLANT
SUCTION FRAZIER HANDLE 10FR (MISCELLANEOUS) ×1
SUCTION TUBE FRAZIER 10FR DISP (MISCELLANEOUS) ×1 IMPLANT
SUT CHROMIC 3 0 PS 2 (SUTURE) ×2 IMPLANT
SUT CHROMIC 4 0 P 3 18 (SUTURE) IMPLANT
SYR 50ML SLIP (SYRINGE) ×2 IMPLANT
TOWEL NATURAL 10PK STERILE (DISPOSABLE) ×2 IMPLANT
TUBE CONNECTING 12X1/4 (SUCTIONS) ×2 IMPLANT
WATER STERILE IRR 1000ML POUR (IV SOLUTION) ×2 IMPLANT
WATER TABLETS ICX (MISCELLANEOUS) ×2 IMPLANT
YANKAUER SUCT BULB TIP NO VENT (SUCTIONS) ×2 IMPLANT

## 2018-04-03 NOTE — Progress Notes (Signed)
PRE-OPERATIVE NOTE:  04/03/2018 Theresa Crawford [id number]  VITALS: BP (!) 103/57   Pulse 78   Temp 98.4 F (36.9 C) (Oral)   Resp 18   LMP 03/15/2018   SpO2 100%   Lab Results  Component Value Date   WBC 18.2 (H) 03/31/2018   HGB 9.7 (L) 03/31/2018   HCT 31.9 (L) 03/31/2018   MCV 80.6 03/31/2018   PLT 375 03/31/2018   BMET    Component Value Date/Time   NA 140 03/31/2018 1349   K 3.1 (L) 03/31/2018 1349   CL 104 03/31/2018 1349   CO2 24 03/31/2018 1349   GLUCOSE 155 (H) 03/31/2018 1349   BUN 15 03/31/2018 1349   CREATININE 1.37 (H) 03/31/2018 1349   CALCIUM 8.9 03/31/2018 1349   GFRNONAA 48 (L) 03/31/2018 1349   GFRAA 56 (L) 03/31/2018 1349    No results found for: INR, PROTIME No results found for: PTT   Theresa Crawford presents for extraction of tooth #30 with alveoloplasty and gross debridement remaining dentition is in the operating room with general anesthesia.  Patient denies any acute medical or dental changes.  Patient did have a presurgical testing screening for depression and found to be having thoughts of suicide.  Patient found to be in very good mood and spirits today.  Social work consultation has been ordered for today.  Patient will then be considered for further inpatient or outpatient evaluation as indicated after social work evaluation.  We will plan on proceeding with dental procedure today secondary to the elevated white count and infection associated with tooth #30 at this time.   SUBJECTIVE: The patient denies any acute medical or dental changes and agrees to proceed with treatment as planned.  EXAM: No sign of acute dental changes.  ASSESSMENT: Patient is affected by leukocytosis, periapical pathology, acute pulpitis, retained root segment, dental caries, chronic periodontitis, and accretions.  PLAN: Patient agrees to proceed with treatment as planned in the operating room as previously discussed and accepts the risks, benefits, and  complications of the proposed treatment. Patient is aware of the risk for bleeding, bruising, swelling, infection, pain, nerve damage, soft tissue damage, damage to adjacent teeth, sinus involvement, root tip fracture, mandible fracture, and the risks of complications associated with the anesthesia. Patient also is aware of the potential for other complications up to and including death due to her overall cardiovascular compromise.     Charlynne Panderonald F. Kulinski, DDS

## 2018-04-03 NOTE — Discharge Instructions (Signed)

## 2018-04-03 NOTE — Consult Note (Addendum)
[facility] Face-to-Face Psychiatry Consult   Reason for Consult:  SI Referring Physician:  Dr. Kristin Bruins Patient Identification: Theresa Crawford MRN:  [id number] Principal Diagnosis: MDD (major depressive disorder), single episode, severe , no psychosis (HCC) Diagnosis:   Patient Active Problem List   Diagnosis Date Noted  . Chronic diastolic congestive heart failure (HCC) [I50.32]   . Tricuspid regurgitation [I07.1]   . Hypomagnesemia [E83.42] 03/07/2018  . Tobacco abuse [Z72.0]   . Dyspnea [R06.00] 01/11/2018  . Prolonged QT interval [R94.31] 09/10/2017  . Normochromic normocytic anemia [D64.9]   . Pulmonary edema [J81.1] 07/05/2017  . Hypokalemia [E87.6] 06/12/2017  . Pulmonary hypertension (HCC) [I27.20] 06/12/2017  . Flash pulmonary edema (HCC) [J81.0] 06/11/2017  . Depression with anxiety [F41.8] 06/11/2017  . Recurrent mitral valve stenosis and regurgitation s/p mitral valve repair [I05.2]   . Acute pulmonary edema (HCC) [J81.0] 10/10/2016  . S/P mitral valve repair [Z98.890]   . CAP (community acquired pneumonia) [J18.9] 05/25/2016  . Leukocytosis [D72.829] 05/24/2016  . Acute respiratory failure with hypoxia (HCC) [J96.01] 05/23/2016  . Acute on chronic diastolic CHF (congestive heart failure) (HCC) [I50.33] 05/23/2016  . Cough with hemoptysis [R04.2] 05/23/2016  . Postpartum complication pericarditis in 2009 with eventual needing MVR in 2015 [O90.89] 05/23/2016  . Anemia, iron deficiency [D50.9] 05/23/2016  . Hypertension [I10]   . SOB (shortness of breath) [R06.02]   . Respiratory distress [R06.03] 02/16/2016  . Essential hypertension [I10] 02/16/2016    Total Time spent with patient: 1 hour  Subjective:   Theresa Crawford is a 37 y.o. female patient admitted with surgical extraction of tooth with alveoloplasty.  HPI:   Per chart review, patient endorsed SI during presurgical testing intake on 7/19 so psychiatry was consulted for suicide risk assessment.   On interview,  Theresa Crawford endorses chronic, intermittent SI.  She denies any intention to harm herself but does report a prior history of suicide attempt by overdosing.  She denies HI or AVH.  She reports several stressors include including the loss of custody of her 58 y/o son to her aunt 2 years ago, financial stressors and limited social support.  She is from [place] and moved to [place] in 2017 because her mother was ill.  Current medications include Celexa 40 mg BID and Seroquel 25 mg qhs.  She reports that she has been taking these medications for a couple years.  She reports medication compliance.  She reports severe depression and anxiety for sometime.  She reports poor sleep with problems staying asleep and falling asleep due to racing thoughts.  She reports that she is "physically and mentally drained."  She reports fluctuations in her appetite and weight.  She denies access to weapons or guns.  She is initially reluctant to inpatient psychiatric hospitalization but has good insight about her condition and would like to feel better.  Past Psychiatric History: Depression and anxiety   Risk to Self:  Yes. Endorses SI. Risk to Others:  None. Denies HI.  Prior Inpatient Therapy:  Denies  Prior Outpatient Therapy:  She is followed by her PCP, Dr. Colvin Caroli.   Past Medical History:  Past Medical History:  Diagnosis Date  . Anxiety   . Chronic diastolic CHF (congestive heart failure) (HCC)   . Chronic diastolic congestive heart failure (HCC)   . Depression   . GERD (gastroesophageal reflux disease)   . Heart murmur   . History of open heart surgery   . Hypertension   . Mitral valve  disease   . Normocytic anemia   . Obesity   . Palpitations   . Pericarditis   . Premature atrial contractions   . Pulmonary hypertension (HCC)   . PVC's (premature ventricular contractions)    a. h/o palpitations with event monitor in 03/2017 showing NSR with PACs/PVCs.  . Recurrent mitral valve stenosis and  regurgitation s/p mitral valve repair   . [place]  . Sarcoidosis   . Tobacco abuse   . Tricuspid regurgitation     Past Surgical History:  Procedure Laterality Date  . CESAREAN SECTION     X's 2  . MITRAL VALVE REPAIR  03/26/2013    [facility] [place]   . PARTIAL HYSTERECTOMY  2011   PID w/problem with fallopian tubes, had left fallopian and left ovary removed, pt still having periods  . TEE WITHOUT CARDIOVERSION N/A 05/26/2016   Procedure: TRANSESOPHAGEAL ECHOCARDIOGRAM (TEE);  Surgeon: Laqueta LindenSuresh A Koneswaran, MD;  Location: [facility];  Service: Cardiovascular;  Laterality: N/A;  . TEE WITHOUT CARDIOVERSION N/A 01/25/2018   Procedure: TRANSESOPHAGEAL ECHOCARDIOGRAM (TEE) WITH PROPOFOL;  Surgeon: Jonelle SidleMcDowell, Samuel G, MD;  Location: [facility];  Service: Cardiovascular;  Laterality: N/A;   Family History:  Family History  Problem Relation Age of Onset  . Heart failure Father        just received LVAD  . Heart disease Paternal Grandfather        stent placement   Family Psychiatric  History: Mother-BPAD.  Social History:  Social History   Substance and Sexual Activity  Alcohol Use Yes  . Alcohol/week: 0.0 oz   Comment: beer and wine socially, not recently     Social History   Substance and Sexual Activity  Drug Use No    Social History   Socioeconomic History  . Marital status: Single    Spouse name: Not on file  . Number of children: 2  . Years of education: Not on file  . Highest education level: Not on file  Occupational History  . Not on file  Social Needs  . Financial resource strain: Not on file  . Food insecurity:    Worry: Not on file    Inability: Not on file  . Transportation needs:    Medical: Not on file    Non-medical: Not on file  Tobacco Use  . Smoking status: Current Some Day Smoker    Packs/day: 0.25    Types: Cigarettes    Start date: 11/03/1999  . Smokeless tobacco: Never  Used  . Tobacco comment: quit since seen Dr. Cornelius Moraswen "last week"  Substance and Sexual Activity  . Alcohol use: Yes    Alcohol/week: 0.0 oz    Comment: beer and wine socially, not recently  . Drug use: No  . Sexual activity: Not on file  Lifestyle  . Physical activity:    Days per week: Not on file    Minutes per session: Not on file  . Stress: Not on file  Relationships  . Social connections:    Talks on phone: Not on file    Gets together: Not on file    Attends religious service: Not on file    Active member of club or organization: Not on file    Attends meetings of clubs or organizations: Not on file    Relationship status: Not on file  Other Topics Concern  . Not on file  Social History Narrative  .  Not on file   Additional Social History: She lives at home with her 39 y/o daughter. Her 76 y/o son lives in [place] with her aunt. She is unemployed. She denies alcohol or illicit substance use.     Allergies:   Allergies  Allergen Reactions  . Bee Venom Anaphylaxis  . Lisinopril Shortness Of Breath, Swelling and Other (See Comments)    Throat swelling  . Penicillins Shortness Of Breath, Swelling and Other (See Comments)    Has patient had a PCN reaction causing immediate rash, facial/tongue/throat swelling, SOB or lightheadedness with hypotension: Yes Has patient had a PCN reaction causing severe rash involving mucus membranes or skin necrosis: Yes Has patient had a PCN reaction that required hospitalization No Has patient had a PCN reaction occurring within the last 10 years: No If all of the above answers are "NO", then may proceed with Cephalosporin use.   Marland Kitchen Perflutren Lipid Microsphere Shortness Of Breath and Other (See Comments)    Other reaction(s): Chest Pain/Tightness  . Shellfish Allergy Anaphylaxis  . Contrast Media [Iodinated Diagnostic Agents] Hives, Itching and Other (See Comments)    Itching and hives to throat, neck, face and arms.   No difficulty  breathing.   This was given at [facility] approximately 2015. Contrast Dye    Labs:  Results for orders placed or performed during the hospital encounter of 04/03/18 (from the past 48 hour(s))  Pregnancy, urine POC     Status: None   Collection Time: 04/03/18  6:17 AM  Result Value Ref Range   Preg Test, Ur NEGATIVE NEGATIVE    Comment:        THE SENSITIVITY OF THIS METHODOLOGY IS >24 mIU/mL     Current Facility-Administered Medications  Medication Dose Route Frequency Provider Last Rate Last Dose  . acetaminophen (TYLENOL) tablet 325-650 mg  325-650 mg Oral Q4H PRN Bethena Midget, MD       Or  . acetaminophen (TYLENOL) solution 325-650 mg  325-650 mg Oral Q4H PRN Bethena Midget, MD      . fentaNYL (SUBLIMAZE) 100 MCG/2ML injection           . fentaNYL (SUBLIMAZE) injection 25-50 mcg  25-50 mcg Intravenous Q5 min PRN Bethena Midget, MD   25 mcg at 04/03/18 1046  . meperidine (DEMEROL) injection 6.25-12.5 mg  6.25-12.5 mg Intravenous Q5 min PRN Bethena Midget, MD      . oxyCODONE (Oxy IR/ROXICODONE) immediate release tablet 5 mg  5 mg Oral Once PRN Bethena Midget, MD       Or  . oxyCODONE (ROXICODONE) 5 MG/5ML solution 5 mg  5 mg Oral Once PRN Bethena Midget, MD        Musculoskeletal: Strength & Muscle Tone: within normal limits Gait & Station: normal Patient leans: N/A  Psychiatric Specialty Exam: Physical Exam  Nursing note and vitals reviewed. Constitutional: She is oriented to person, place, and time. She appears well-developed and well-nourished.  HENT:  Head: Normocephalic and atraumatic.  Neck: Normal range of motion.  Respiratory: Effort normal.  Musculoskeletal: Normal range of motion.  Neurological: She is alert and oriented to person, place, and time.  Skin: No rash noted.  Psychiatric: Her speech is normal and behavior is normal. Judgment normal. Her mood appears anxious. Cognition and memory are normal. She exhibits a depressed mood. She  expresses suicidal ideation. She expresses no suicidal plans.    Review of Systems  HENT:       Tooth pain from extraction.  Psychiatric/Behavioral: Positive for depression and suicidal ideas. Negative for hallucinations and substance abuse. The patient is nervous/anxious and has insomnia.   All other systems reviewed and are negative.   Blood pressure 103/62, pulse 70, temperature (!) 97.2 F (36.2 C), resp. rate (!) 23, last menstrual period 03/15/2018, SpO2 95 %.There is no height or weight on file to calculate BMI.  General Appearance: Fairly Groomed, young, African American female, wearing a hospital gown with multiple body tattoos and sitting in a chair. NAD.   Eye Contact:  Good  Speech:  Clear and Coherent and Normal Rate  Volume:  Normal  Mood:  Anxious and Depressed  Affect:  Congruent and Tearful  Thought Process:  Goal Directed, Linear and Descriptions of Associations: Intact  Orientation:  Full (Time, Place, and Person)  Thought Content:  Logical  Suicidal Thoughts:  Yes.  without intent/plan  Homicidal Thoughts:  No  Memory:  Immediate;   Good Recent;   Good Remote;   Good  Judgement:  Good  Insight:  Good  Psychomotor Activity:  Normal  Concentration:  Concentration: Good and Attention Span: Good  Recall:  Good  Fund of Knowledge:  Good  Language:  Good  Akathisia:  No  Handed:  Right  AIMS (if indicated):   N/A  Assets:  Communication Skills Desire for Improvement Housing  ADL's:  Intact  Cognition:  WNL  Sleep:   Fair   Assessment:  ROZLYNN LIPPOLD is a 37 y.o. female who was admitted for surgical extraction of tooth with alveoloplasty in preparation for cardiac valve replacement. She endorses chronic SI in the setting of depression and multiple psychosocial stressors. She reports feelings of hopelessness. She has a prior history of suicide attempt which increases her risk for attempting again. She warrants inpatient psychiatric hospitalization for  stabilization and treatment.   Treatment Plan Summary: -Patient warrants inpatient psychiatric hospitalization given high risk of harm to self. -Continue Software engineer.  -Continue home medications: Celexa 40 mg BID for depression and anxiety. Increase Seroquel 25 mg qhs to 50 mg qhs for insomnia and anxiety.  -EKG reviewed and QTc 487 on 6/25. Please closely monitor when starting or increasing QTc prolonging agents.  -Will sign off on patient at this time. Please consult psychiatry again as needed.     Disposition: Recommend psychiatric Inpatient admission when medically cleared.  Cherly Beach, DO 04/03/2018 11:13 AM

## 2018-04-03 NOTE — Anesthesia Procedure Notes (Signed)
Procedure Name: Intubation Date/Time: 04/03/2018 7:40 AM Performed by: Neldon Newport, CRNA Pre-anesthesia Checklist: Timeout performed, Patient being monitored, Suction available, Emergency Drugs available and Patient identified Patient Re-evaluated:Patient Re-evaluated prior to induction Oxygen Delivery Method: Circle system utilized Preoxygenation: Pre-oxygenation with 100% oxygen Induction Type: IV induction Ventilation: Mask ventilation without difficulty Laryngoscope Size: Mac and 3 Grade View: Grade I Tube type: Oral Tube size: 7.0 mm Number of attempts: 1 Placement Confirmation: breath sounds checked- equal and bilateral,  positive ETCO2 and ETT inserted through vocal cords under direct vision Secured at: 22 cm Tube secured with: Tape Dental Injury: Teeth and Oropharynx as per pre-operative assessment

## 2018-04-03 NOTE — Progress Notes (Signed)
CSW aware that pt heading to surgery today. CSW was consulted as RNCM expressed that she received call informing her that pt is having suicidal ideation. CSW spoke with RN in PACU and suggested that pt have a TTS consult placed in order to assess for further needs. RN Phil expressed verbal understanding of this and expressed that consult would be placed. There are no further CSW needs at this time. CSW will sign off. If new need arises please res consult.   Theresa Crawford, MSW, LCSW-A Emergency Department Clinical Social Worker [phone]

## 2018-04-03 NOTE — H&P (Signed)
04/03/2018  Patient:            Theresa Crawford Date of Birth:  12-May-1981 MRN:                [id number]   BP (!) 103/57   Pulse 78   Temp 98.4 F (36.9 C) (Oral)   Resp 18   LMP 03/15/2018   SpO2 100%    Theresa Crawford is a 37 year old female that presents for extraction of tooth #30 with alveoloplasty and gross debridement of remaining dentition in the operating room with general anesthesia as part of a pre-heart valve surgery dental protocol.  Patient denies any acute medical or dental changes.  Please see note from Dr. Cornelius Moras dated 03/20/2018 to act as H&P for the dental operating room procedure.  Charlynne Pander, DDS       CARDIOTHORACIC SURGERY CONSULTATION REPORT     Referring Provider is [name], MD  PCP is House, Eugenio Hoes, [name]          Chief Complaint    Patient presents with    .   Mitral Stenosis    .   Mitral Regurgitation            s/p mitral valve repair 2014          HPI:     Patient is an obese 37 year old African-American female with long standing history of mitral valve disease and chronic diastolic congestive heart failure status post mitral valve repair in 2014 who has been referred for surgical consultation to discuss treatment options for management of recurrent mitral regurgitation and mitral stenosis.     Patient states that her heart disease began approximately 9 years ago when she was pregnant with her second child.  She states that she developed pericarditis and congestive heart failure.  She was eventually diagnosed with mitral valve disease and sarcoidosis.  She underwent mitral valve repair in 2014.  At the time she lived in [place].  Following surgery she states that she quickly began to experience worsening problems of congestive heart failure.  Within a year following surgery she had been hospitalized on several occasions with acute exacerbation of chronic diastolic congestive heart failure.  She was  referred back to her cardiac surgeon and the possibility of redo surgery was discussed.  She moved to [place] in January 2018.  She has been followed intermittently since then by Dr. Purvis Sheffield, and she has been hospitalized on numerous occasions with acute exacerbation of chronic diastolic congestive heart failure and pulmonary edema.  Follow-up echocardiograms have documented the presence of normal left ventricular systolic function with at least mild mitral regurgitation and increased transvalvular gradients consistent with moderate to severe mitral stenosis.  She underwent transesophageal echocardiogram on Jan 25, 2018 during her most recent hospitalization for congestive heart failure.  Left ventricular systolic function was reported to be normal with ejection fraction estimated 55 to 60%.  There was felt to be moderate thickening with mild tethering of the anterior leaflet and severely restricted motion of the posterior leaflet of the mitral valve.  Findings were felt to be consistent with moderate to severe mitral stenosis with mean transvalvular gradient estimated 9 mmHg and planimeter valve area measured 1.39 cm.  There was eccentric mitral regurgitation.  Cardiothoracic surgical consultation was requested.     Patient is single and lives with her mother in [place].  She is currently unemployed.  Her children are ages 15 and 62.  She describes  chronic symptoms of exertional shortness of breath, orthopnea, lower extremity edema, and a chronic cough which wax and wane in severity but have essentially remained present ever since she underwent surgery in 2014.  She has been hospitalized on countless occasions for acute exacerbation of congestive heart failure, with symptoms always improved following intravenous diuresis.  She takes oral diuretics regularly and frequently doubles up on dosage if her weight is up or shortness of breath gets worse.  She has a chronic cough with occasional episodes of  frank hemoptysis.  She reports some occasional tightness across her chest which is typically transient and fleeting, not necessarily related to shortness of breath, and not at all related to physical exertion.  She admits that she suffers from some degree of chronic anxiety and she continues to smoke cigarettes, although she has cut back to only a handful of cigarettes daily.  She reports occasional episodes of transient blurry vision which occur only in her left eye.  These have been occurring off and on for several years.          Past Medical History:    Diagnosis   Date    .   Chronic diastolic CHF (congestive heart failure) (HCC)        .   Chronic diastolic congestive heart failure (HCC)        .   History of open heart surgery        .   Hypertension        .   Mitral valve disease        .   Normocytic anemia        .   Obesity        .   Pericarditis        .   Premature atrial contractions        .   Pulmonary hypertension (HCC)        .   PVC's (premature ventricular contractions)            a. h/o palpitations with event monitor in 03/2017 showing NSR with PACs/PVCs.    .   Recurrent mitral valve stenosis and regurgitation s/p mitral valve repair        .   S/P mitral valve repair   03/2013        [place], [place]    .   Sarcoidosis        .   Tobacco abuse        .   Tricuspid regurgitation                    Past Surgical History:    Procedure   Laterality   Date    .   ABDOMINAL HYSTERECTOMY            .   CESAREAN SECTION                X's 2    .   MITRAL VALVE REPAIR       03/26/2013         [facility] in [place], [place]     .   PARTIAL HYSTERECTOMY       2011        PID w/problem with fallopian tubes    .   TEE WITHOUT CARDIOVERSION   N/A   05/26/2016        Procedure:  TRANSESOPHAGEAL ECHOCARDIOGRAM (TEE);  Surgeon: [name], MD;  Location: [facility];  Service: Cardiovascular;  Laterality: N/A;    .   TEE WITHOUT CARDIOVERSION   N/A   01/25/2018        Procedure: TRANSESOPHAGEAL ECHOCARDIOGRAM (TEE) WITH PROPOFOL;  Surgeon: Jonelle Sidle, MD;  Location: [facility];  Service: Cardiovascular;  Laterality: N/A;                Family History    Problem   Relation   Age of Onset    .   Heart failure   Father                just received LVAD    .   Heart disease   Paternal Grandfather                stent placement           Social History             Socioeconomic History    .   Marital status:   Single            Spouse name:   Not on file    .   Number of children:   Not on file    .   Years of education:   Not on file    .   Highest education level:   Not on file    Occupational History    .   Not on file    Social Needs    .   Financial resource strain:   Not on file    .   Food insecurity:            Worry:   Not on file            Inability:   Not on file    .   Transportation needs:            Medical:   Not on file            Non-medical:   Not on file    Tobacco Use    .   Smoking status:   Current Some Day Smoker            Packs/day:   0.25            Types:   Cigarettes            Start date:   11/03/1999    .   Smokeless tobacco:   Never Used    Substance and Sexual Activity    .   Alcohol use:   No            Alcohol/week:   0.0 oz    .   Drug use:   No    .   Sexual activity:   Not on file    Lifestyle    .   Physical activity:            Days per week:   Not on file            Minutes per session:   Not on file    .   Stress:   Not on file    Relationships    .    Social connections:            Talks on phone:   Not on file            Gets together:  Not on file            Attends religious service:   Not on file            Active member of club or organization:   Not on file            Attends meetings of clubs or organizations:   Not on file            Relationship status:   Not on file    .   Intimate partner violence:            Fear of current or ex partner:   Not on file            Emotionally abused:   Not on file            Physically abused:   Not on file            Forced sexual activity:   Not on file    Other Topics   Concern    .   Not on file    Social History Narrative    .   Not on file                 Current Outpatient Medications    Medication   Sig   Dispense   Refill    .   bumetanide (BUMEX) 2 MG tablet   Take 2 mg by mouth 2 (two) times daily.             Marland Kitchen   EPINEPHrine (EPIPEN 2-PAK) 0.3 mg/0.3 mL IJ SOAJ injection   Inject 0.3 Units into the muscle once as needed.            .   ferrous sulfate 325 (65 FE) MG tablet   Take 1 tablet (325 mg total) by mouth 2 (two) times daily with a meal.       3    .   metoprolol succinate (TOPROL-XL) 25 MG 24 hr tablet   TAKE 1 TABLET BY MOUTH DAILY   30 tablet   6    .   potassium chloride SA (K-DUR,KLOR-CON) 20 MEQ tablet   Take 2 tablets (40 mEq total) by mouth daily. (Patient taking differently: Take 40 mEq by mouth daily. 4 TABLETS DAILY)   60 tablet   2        No current facility-administered medications for this visit.                 Allergies    Allergen   Reactions    .   Lisinopril   Shortness Of Breath and Swelling            Throat swelling    .   Penicillins   Shortness Of Breath and Swelling            Has patient had a PCN reaction causing immediate rash,  facial/tongue/throat swelling, SOB or lightheadedness with hypotension: Yes  Has patient had a PCN reaction causing severe rash involving mucus membranes or skin necrosis: Yes  Has patient had a PCN reaction that required hospitalization No  Has patient had a PCN reaction occurring within the last 10 years: No  If all of the above answers are "NO", then may proceed with Cephalosporin use.       Marland Kitchen   Perflutren Lipid Microsphere   Shortness Of Breath  Other reaction(s): Chest Pain/Tightness    .   Bee Venom        .   Contrast Media [Iodinated Diagnostic Agents]   Hives and Itching            Itching and hives to throat, neck, face and arms.   No difficulty breathing.   This was given at [facility] approximately 2015. Contrast Dye    .   Shellfish Allergy                    Review of Systems:                 General:                      normal appetite, normal energy, + weight gain, no weight loss, no fever              Cardiac:                       no chest pain with exertion, + occasional chest pain at rest, +SOB with any exertion, occasionial resting SOB, + PND, + orthopnea, no palpitations, no arrhythmia, no atrial fibrillation, + LE edema, + dizzy spells, no syncope              Respiratory:                 + shortness of breath, no home oxygen, + intermittent productive cough, + chronic dry cough, no bronchitis, + wheezing, + hemoptysis, no asthma, + pain with inspiration or cough, no sleep apnea, no CPAP at night              GI:                               No difficulty swallowing, + reflux, no frequent heartburn, no hiatal hernia, no abdominal pain, + constipation, no diarrhea, no hematochezia, no hematemesis, no melena              GU:                              no dysuria,  no frequency, no urinary tract infection, no hematuria, no  kidney stones, no kidney disease              Vascular:                      no pain suggestive of claudication, no pain in feet, + leg cramps, no varicose veins, no DVT, no non-healing foot ulcer              Neuro:                         no stroke, no TIA's, no seizures, + headaches, + temporary blindness one eye,  no slurred speech, no peripheral neuropathy, no chronic pain, no instability of gait, no memory/cognitive dysfunction              Musculoskeletal:         + arthritis, no joint swelling, + myalgias, no difficulty walking, normal mobility               Skin:  no rash, no itching, no skin infections, no pressure sores or ulcerations              Psych:                         + anxiety, + depression, no nervousness, + unusual recent stress              Eyes:                           + blurry vision, + floaters, no recent vision changes, + wears glasses or contacts              ENT:                            no hearing loss, no loose or painful teeth, no dentures, last saw dentist > 1 year ago              Hematologic:               no easy bruising, no abnormal bleeding, no clotting disorder, no frequent epistaxis              Endocrine:                   no diabetes, does not check CBG's at home                               Physical Exam:                 BP 106/64 (BP Location: Right Arm, Patient Position: Sitting, Cuff Size: Large)   Pulse 83   Resp 18   Ht 5\' 4"  (1.626 m)   Wt 218 lb (98.9 kg)   LMP 03/05/2018   SpO2 94% Comment: ON RA  BMI 37.42 kg/m               General:                      Obese  well-appearing              HEENT:                       Unremarkable               Neck:                           no JVD, no bruits, no adenopathy               Chest:                          clear to auscultation, symmetrical breath sounds, no wheezes, no rhonchi               CV:                              RRR, grade II/VI systolic murmur               Abdomen:  soft,  non-tender, no masses               Extremities:                 warm, well-perfused, pulses not palpable, trace LE edema              Rectal/GU                   Deferred              Neuro:                         Grossly non-focal and symmetrical throughout              Skin:                            Clean and dry, no rashes, no breakdown        Diagnostic Tests:        Transesophageal Echocardiography     Patient:    Jameela, Michna  MR #:       [id number]  Study Date: 01/25/2018  Gender:     F  Age:        37  Height:     162.6 cm  Weight:     103.5 kg  BSA:        2.21 m^2  Pt. Status:  Room:      ADMITTING    Nona Dell, M.D.   ATTENDING    Nona Dell, M.D.   PERFORMING   Chmg, [name]   SONOGRAPHER  Celesta Gentile, RCS   ORDERING     Iran Ouch, [place] M   REFERRING    Iran Ouch [place] M     cc:     -------------------------------------------------------------------  LV EF: 55% -   60%     -------------------------------------------------------------------  Indications:      Mitral stenosis [non-rheumatic] 424.0.     -------------------------------------------------------------------  History:   PMH:  Acquired from the patient and from the patient&'s  chart.  PMH:  Hx of Acute respiratory failure with hypoxia and  Acute on chronic diastolic CHF (congestive heart failure)  Risk  factors:  Hypertension.     -------------------------------------------------------------------  Study Conclusions     - Left ventricle: Systolic function was normal. The estimated    ejection fraction was in the range of 55% to 60%. Wall motion was    normal; there were no regional wall motion abnormalities.  - Aorta: Mild atheroschlerotic plaque noted within the aortic arch.  - Mitral valve: Moderately thickened leaflets with mild tethering    of the anterior leaflet and restricted motion of the posterior     leaflet. Chordae appear thickened and shortened as well. Annular    ring present. The findings are consistent with moderate to severe    stenosis. There was mild regurgitation directed eccentrically.    Mean gradient (D): 9 mm Hg. Planimetered valve area: 1.39 cm^2.  - Left atrium: The atrium was dilated. There was moderatecontinuous    spontaneous echo contrast (&quot;smoke&quot;) in the appendage. Emptying    velocity was severely reduced.  - Right ventricle: The cavity size was mildly dilated.  - Right atrium: The atrium was dilated.  - Atrial septum: No defect or patent foramen ovale was identified.    Few isolated bubbles noted in left atrium after 4-5 beats.  -  Tricuspid valve: There was moderate regurgitation. Peak RV-RA    gradient (S): 52 mm Hg.  - Pulmonary arteries: Systolic pressure was not measured directly,    but based on RVSP is moderately to severely increased.  - Pericardium, extracardiac: There was no pericardial effusion.     -------------------------------------------------------------------  Labs, prior tests, procedures, and surgery:  S/P mitral valve repair.     -------------------------------------------------------------------  Study data:   Study status:  Routine.  Procedure:  The patient  reported no pain pre or post test. Initial setup. The patient was  brought to the laboratory. Surface ECG leads were monitored.  Sedation. Conscious to deep sedation was administered with use of  propofol per anesthesia service. Transesophageal echocardiography.  Topical anesthesia was obtained using viscous lidocaine. A  transesophageal probe was inserted by the attending cardiologist.  Image quality was adequate.  Study completion:  The patient  tolerated the procedure well. There were no complications.  Diagnostic transesophageal echocardiography.  2D and color Doppler.   Birthdate:  Patient birthdate: 06/26/1981.  Age:  Patient is 37  yr  old.  Sex:  Gender: female.    BMI: 39.1 kg/m^2.  Blood pressure:    112/60  Study date:  Study date: 01/25/2018. Study time: 10:50  AM.  Location:  Endoscopy 3.     -------------------------------------------------------------------     -------------------------------------------------------------------  Left ventricle:  Systolic function was normal. The estimated  ejection fraction was in the range of 55% to 60%. Wall motion was  normal; there were no regional wall motion abnormalities.     -------------------------------------------------------------------  Aortic valve:   Trileaflet.     -------------------------------------------------------------------  Aorta:  Mild atheroschlerotic plaque noted within the aortic arch.     -------------------------------------------------------------------  Mitral valve:  Moderately thickened leaflets with mild tethering of  the anterior leaflet and restricted motion of the posterior  leaflet. Chordae appear thickened and shortened as well. Annular  ring present.  Doppler:   The findings are consistent with moderate  to severe stenosis.   There was mild regurgitation directed  eccentrically.    Planimetered valve area: 1.39 cm^2. Indexed valve  area by planimetry: 0.63 cm^2/m^2.    Mean gradient (D): 9 mm Hg.     -------------------------------------------------------------------  Left atrium:  The atrium was dilated. There was moderatecontinuous  spontaneous echo contrast (&quot;smoke&quot;) in the appendage.  Emptying  velocity was severely reduced.     -------------------------------------------------------------------  Atrial septum:  No defect or patent foramen ovale was identified.  Few isolated bubbles noted in left atrium after 4-5 beats.     -------------------------------------------------------------------  Right ventricle:  The cavity size was mildly dilated.      -------------------------------------------------------------------  Pulmonic valve:   Poorly visualized.  Doppler:  There was trivial  regurgitation.     -------------------------------------------------------------------  Tricuspid valve:  Mildly thickened leaflets.  Doppler:  There was  moderate regurgitation.     -------------------------------------------------------------------  Pulmonary artery:   Systolic pressure was not measured directly,  but based on RVSP is moderately to severely increased.     -------------------------------------------------------------------  Right atrium:  The atrium was dilated.     -------------------------------------------------------------------  Pericardium:  There was no pericardial effusion.     -------------------------------------------------------------------  Post procedure conclusions  Ascending Aorta:     - Mild atheroschlerotic plaque noted within the aortic arch.     -------------------------------------------------------------------  Measurements      LVOT  Value   LVOT ID, S                                 19    mm   LVOT area                                  2.84  cm^2      Mitral valve                               Value   Mitral valve area, planimetry              1.39  cm^2   Mitral valve area/bsa, planimetry          0.63  cm^2/m^2   Mitral mean velocity, D                    137   cm/s   Mitral mean gradient, D                    9     mm Hg   Mitral annulus VTI, D                      61.5  cm   Mitral maximal regurg velocity, PISA       362   cm/s   Mitral regurg VTI, PISA                    105   cm   Mitral ERO, PISA                           0.05  cm^2   Mitral regurg volume, PISA                 5     ml      Tricuspid valve                            Value   Tricuspid regurg peak velocity             360   cm/s   Tricuspid  peak RV-RA gradient              52    mm Hg     Legend:  (L)  and  (H)  mark values outside specified reference range.     -------------------------------------------------------------------  Prepared and Electronically Authenticated by     Nona Dell, M.D.  2019-05-15T12:50:04        Transthoracic Echocardiography     Patient:    Damiya, Sandefur  MR #:       [id number]  Study Date: 09/10/2017  Gender:     F  Age:        36  Height:     162.6 cm  Weight:     104.8 kg  BSA:        2.23 m^2  Pt. Status:  Room:       A306      ATTENDING    Erick Blinks [id number]   PERFORMING   Chmg, [name]   SONOGRAPHER  Sheralyn Boatman   ADMITTING  Opyd, Timothy S   ORDERING     Opyd, Timothy S   REFERRING    Opyd, Timothy S     cc:     -------------------------------------------------------------------  LV EF: 55% -   60%     -------------------------------------------------------------------  Indications:      CHF - 428.0.     -------------------------------------------------------------------  History:   PMH:  Severe mitral stenosis. Mitral valve repair in  [place]. 2014. Respiratory failure.  Dyspnea.  Mitral valve  disease.  Mitral stenosis.  Primary pulmonary hypertension.  Risk  factors:  Hypertension.     -------------------------------------------------------------------  Study Conclusions     - Left ventricle: The cavity size was normal. Systolic function was    normal. The estimated ejection fraction was in the range of 55%    to 60%. Wall motion was normal; there were no regional wall    motion abnormalities.  - Ventricular septum: Septal motion showed paradox.  - Mitral valve: Evidence of prior mitral repair. Mild retricted    leaflet posteriorly. Prior procedures included surgical repair.    An annular ring prosthesis was present. Valve area by pressure    half-time: 2.34 cm^2. Valve area by  continuity equation (using    LVOT flow): 0.61 cm^2.  - Left atrium: The atrium was mildly to moderately dilated.  - Right atrium: The atrium was mildly to moderately dilated.  - Tricuspid valve: There was moderate regurgitation.  - Pulmonary arteries: Systolic pressure was moderately increased.    PA peak pressure: 67 mm Hg (S).     -------------------------------------------------------------------  Study data:  The previous study was not available, so comparison  was made to the report of 06/10/2017.  Study status:  Routine.  Procedure:  The patient reported no pain pre or post test.  Transthoracic echocardiography. Image quality was adequate.  Study  completion:  There were no complications.          Transthoracic  echocardiography.  M-mode, complete 2D, spectral Doppler, and color  Doppler.  Birthdate:  Patient birthdate: 03-07-1981.  Age:  Patient  is 36 yr old.  Sex:  Gender: female.    BMI: 39.6 kg/m^2.  Blood  pressure:     129/51  Patient status:  Inpatient.  Study date:  Study date: 09/10/2017. Study time: 11:02 AM.  Location:  Bedside.     -------------------------------------------------------------------     -------------------------------------------------------------------  Left ventricle:  The cavity size was normal. Systolic function was  normal. The estimated ejection fraction was in the range of 55% to  60%. Wall motion was normal; there were no regional wall motion  abnormalities.     -------------------------------------------------------------------  Aortic valve:   Trileaflet; normal thickness leaflets. Mobility was  not restricted.  Doppler:  Transvalvular velocity was within the  normal range. There was no stenosis. There was no regurgitation.     -------------------------------------------------------------------  Aorta:  Aortic root: The aortic root was normal in size.      -------------------------------------------------------------------  Mitral valve:  Evidence of prior mitral repair. Restricted leaflet  motion posteriorly. Prior procedures included surgical repair. An  annular ring prosthesis was present. Mobility was not restricted.  Doppler:  Transvalvular velocity was within the normal range. There  was no evidence for stenosis. There was no regurgitation.    Valve  area by pressure half-time: 2.34 cm^2. Indexed valve area by  pressure half-time: 1.05 cm^2/m^2. Valve area by continuity  equation (using LVOT flow): 0.61 cm^2. Indexed valve area by  continuity equation (  using LVOT flow): 0.27 cm^2/m^2.    Mean  gradient (D): 15 mm Hg. Peak gradient (D): 25 mm Hg.     -------------------------------------------------------------------  Left atrium:  The atrium was mildly to moderately dilated.     -------------------------------------------------------------------  Right ventricle:  The cavity size was normal. Wall thickness was  normal. Systolic function was normal.     -------------------------------------------------------------------  Ventricular septum:   Septal motion showed paradox.     -------------------------------------------------------------------  Pulmonic valve:    The valve appears to be grossly normal.  Doppler:  Transvalvular velocity was within the normal range. There  was no evidence for stenosis. There was mild regurgitation.     -------------------------------------------------------------------  Tricuspid valve:   Structurally normal valve.    Doppler:  Transvalvular velocity was within the normal range. There was  moderate regurgitation.     -------------------------------------------------------------------  Pulmonary artery:   The main pulmonary artery was normal-sized.  Systolic pressure was moderately increased.      -------------------------------------------------------------------  Right atrium:  The atrium was mildly to moderately dilated.     -------------------------------------------------------------------  Pericardium:  There was no pericardial effusion.     -------------------------------------------------------------------  Systemic veins:  Inferior vena cava: The vessel was moderately dilated. The  respirophasic diameter changes were blunted (< 50%).     -------------------------------------------------------------------  Measurements      Left ventricle                           Value          Reference   LV ID, ED, PLAX chordal                  49.6  mm       43 - 52   LV ID, ES, PLAX chordal                  32    mm       23 - 38   LV fx shortening, PLAX chordal           35    %        >=29   LV PW thickness, ED                      14.8  mm       ----------   IVS/LV PW ratio, ED                      0.76           <=1.3   Stroke volume, 2D                        60    ml       ----------   Stroke volume/bsa, 2D                    27    ml/m^2   ----------   LV ejection fraction, 1-p A4C            68    %        ----------   LV end-diastolic volume, 2-p             92    ml       ----------   LV end-systolic volume, 2-p  41    ml       ----------   LV ejection fraction, 2-p                55    %        ----------   Stroke volume, 2-p                       51    ml       ----------   LV end-diastolic volume/bsa, 2-p         41    ml/m^2   ----------   LV end-systolic volume/bsa, 2-p          18    ml/m^2   ----------   Stroke volume/bsa, 2-p                   22.8  ml/m^2   ----------   LV e&', lateral                           8.92  cm/s     ----------   LV E/e&', lateral                         27.91          ----------   LV e&', medial                            6.64  cm/s     ----------   LV E/e&', medial                           37.5           ----------   LV e&', average                           7.78  cm/s     ----------   LV E/e&', average                         32.01          ----------      Ventricular septum                       Value          Reference   IVS thickness, ED                        11.2  mm       ----------      LVOT                                     Value          Reference   LVOT ID, S                               18    mm       ----------   LVOT area  2.54  cm^2     ----------   LVOT peak velocity, S                    125   cm/s     ----------   LVOT mean velocity, S                    87.5  cm/s     ----------   LVOT VTI, S                              23.6  cm       ----------   LVOT peak gradient, S                    6     mm Hg    ----------      Aorta                                    Value          Reference   Aortic root ID, ED                       27    mm       ----------   Ascending aorta ID, A-P, S               23    mm       ----------      Left atrium                              Value          Reference   LA ID, A-P, ES                           43    mm       ----------   LA ID/bsa, A-P                           1.93  cm/m^2   <=2.2   LA volume, S                             70.8  ml       ----------   LA volume/bsa, S                         31.8  ml/m^2   ----------   LA volume, ES, 1-p A4C                   78.4  ml       ----------   LA volume/bsa, ES, 1-p A4C               35.2  ml/m^2   ----------   LA volume, ES, 1-p A2C                   63    ml       ----------   LA volume/bsa, ES, 1-p A2C  28.3  ml/m^2   ----------      Mitral valve                             Value          Reference   Mitral E-wave peak velocity              249   cm/s     ----------   Mitral A-wave peak velocity              181   cm/s     ----------   Mitral mean velocity, D                  171   cm/s      ----------   Mitral deceleration time         (H)     408   ms       150 - 230   Mitral pressure half-time                94    ms       ----------   Mitral mean gradient, D                  15    mm Hg    ----------   Mitral peak gradient, D                  25    mm Hg    ----------   Mitral E/A ratio, peak                   1.4            ----------   Mitral valve area, PHT, DP               2.34  cm^2     ----------   Mitral valve area/bsa, PHT, DP           1.05  cm^2/m^2 ----------   Mitral valve area, LVOT                  0.61  cm^2     ----------   continuity   Mitral valve area/bsa, LVOT              0.27  cm^2/m^2 ----------   continuity   Mitral annulus VTI, D                    98.5  cm       ----------      Pulmonary arteries                       Value          Reference   PA pressure, S, DP               (H)     67    mm Hg    <=30      Tricuspid valve                          Value          Reference   Tricuspid regurg peak velocity           384   cm/s     ----------   Tricuspid peak RV-RA  gradient            59    mm Hg    ----------      Right atrium                             Value          Reference   RA ID, S-I, ES, A4C                      44.6  mm       34 - 49   RA area, ES, A4C                         14.9  cm^2     8.3 - 19.5   RA volume, ES, A/L                       40.1  ml       ----------   RA volume/bsa, ES, A/L                   18    ml/m^2   ----------      Systemic veins                           Value          Reference   Estimated CVP                            8     mm Hg    ----------      Right ventricle                          Value          Reference   TAPSE                                    13.4  mm       ----------   RV pressure, S, DP               (H)     67    mm Hg    <=30   RV s&', lateral, S                        6.53  cm/s     ----------     Legend:  (L)  and  (H)  mark values outside  specified reference range.     -------------------------------------------------------------------  Prepared and Electronically Authenticated by     Georga Hacking, MD [facility]  2018-12-29T13:25:42                 Impression:     I have personally reviewed the patient's recent transesophageal echocardiogram.  She underwent elective mitral valve repair in 2014 and has stage D severe symptomatic mitral stenosis and regurgitation.  She has suffered from symptoms of chronic diastolic congestive heart failure requiring numerous hospitalizations for acute exacerbation over the last several years.  Details regarding the circumstances of her previous mitral valve repair including intraoperative findings and the original etiology of disease  remain unclear.  The patient describes a history of sarcoidosis and pericarditis in the past, and findings from her recent transthoracic and transesophageal echocardiogram suggest the possibility of inflammatory disease such as rheumatic heart disease and/or sarcoidosis.  There is moderate thickening with mild to moderate restricted mobility of the anterior leaf of the mitral valve with severely restricted mobility of the posterior leaflet.  There is an eccentric jet of regurgitation which courses anteriorly around the left atrium.  Although the mitral regurgitation was not directly quantified, it appears at least moderate in severity.  The annuloplasty ring appears intact.  Ring size remains unknown at this time.  By planimetry the valve area is less than 1.5 cm and mean transvalvular gradients have ranged between 8 and 15 mmHg.  Patient also has significant right ventricular chamber enlargement with moderate tricuspid regurgitation.  Options at this point include continued medical therapy versus redo mitral valve repair or replacement with or without tricuspid valve repair.  Based upon review of the patient's TEE I am not very optimistic that her mitral valve  could be repaired a second time and yield a satisfactory long-term result.  Based upon review of the patient's records and her personal account of her struggles over the past few years, it seems clear that she has failed an attempt at medical therapy.        Plan:     The patient and her friend were counseled at length regarding the indications, risks and potential benefits of redo mitral valve repair or replacement with possible tricuspid valve repair.  The rationale for elective surgery has been explained, including a comparison between surgery and continued medical therapy with close follow-up.  The likelihood of successful and durable valve repair has been discussed with particular reference to the findings of their recent echocardiogram.  Based upon these findings and previous experience, I have quoted her less than 25 percent likelihood of successful redo valve repair.  In the event that their valve cannot be successfully repaired, we discussed the possibility of replacing the mitral valve using a mechanical prosthesis with the attendant need for long-term anticoagulation versus the alternative of replacing it using a bioprosthetic tissue valve with its potential for late structural valve deterioration and failure, depending upon the patient's longevity.  The patient specifically requests that if the mitral valve must be replaced that it be done using a mechanical valve.   Alternative surgical approaches have been discussed including a comparison between conventional sternotomy and minimally-invasive techniques.  The added risk and potential complications associated with redo surgery have been discussed.  Expectations for her postoperative convalescence have been discussed.     The patient is interested in proceeding with surgery at some point in the near future.  She has been counseled that she must find a way to quit smoking cigarettes completely.  She will be referred for dental service  consultation for routine examination and cleaning with additional procedures if necessary.  She will need left and right heart catheterization.  Finally, she will undergo CT angiography to evaluate the feasibility of peripheral arterial cannulation for surgery.  The patient will return to our office for follow-up in approximately 2 to 3 weeks.  All questions answered.           I spent in excess of 90 minutes during the conduct of this office consultation and >50% of this time involved direct face-to-face encounter with the patient for counseling and/or coordination of their care.  Salvatore Decent. Cornelius Moras, MD  03/20/2018  10:35 AM              Electronically signed by Purcell Nails, MD at 03/20/2018 11:15 AM

## 2018-04-03 NOTE — Anesthesia Postprocedure Evaluation (Signed)
Anesthesia Post Note  Patient: Janalee DaneLatasha Y Feger  Procedure(s) Performed: Extraction of tooth #30 with alveoloplasty and gross debridement of remaining teeth (N/A )     Patient location during evaluation: PACU Anesthesia Type: General Level of consciousness: awake and alert Pain management: pain level controlled Vital Signs Assessment: post-procedure vital signs reviewed and stable Respiratory status: spontaneous breathing, nonlabored ventilation, respiratory function stable and patient connected to nasal cannula oxygen Cardiovascular status: blood pressure returned to baseline and stable Postop Assessment: no apparent nausea or vomiting Anesthetic complications: no    Last Vitals:  Vitals:   04/03/18 0852 04/03/18 1000  BP: (!) 97/53   Pulse: 74   Resp: (!) 26   Temp:  (!) 36.2 C  SpO2: 97%     Last Pain:  Vitals:   04/03/18 1000  TempSrc:   PainSc: Asleep                 Carroll Lingelbach

## 2018-04-03 NOTE — Op Note (Signed)
OPERATIVE REPORT  Patient:            Theresa Crawford Date of Birth:  1981-05-05 MRN:                [id number]   DATE OF PROCEDURE:  04/03/2018  PREOPERATIVE DIAGNOSES: 1.  Recurrent mitral valve stenosis and regurgitation 2.  Pre-heart valve surgery dental protocol 3.  Acute pulpitis 4.  Chronic apical periodontitis 5.  Dental caries 6.  Chronic periodontitis 7.  Accretions  POSTOPERATIVE DIAGNOSES: 1.  Recurrent mitral valve stenosis and regurgitation 2.  Pre-heart valve surgery dental protocol 3.  Acute pulpitis 4.  Chronic apical periodontitis 5.  Dental caries 6.  Chronic periodontitis 7.  Accretions  OPERATIONS: 1.  Surgical extraction of tooth #30 with alveoloplasty  2.  Gross debridement of remaining dentition   SURGEON: Charlynne Pander, DDS  ASSISTANT: Pearletha Alfred (dental assistant)  ANESTHESIA: General anesthesia via oral endotracheal tube.  MEDICATIONS: 1.  Clindamycin 600 mg IV prior to invasive dental procedures. 2.  Local anesthesia with a total utilization of 2 carpules each containing 34 mg of lidocaine with 0.017 mg of epinephrine as well as 1 carpule containing 9 mg of bupivacaine with 0.009 mg of epinephrine.  SPECIMENS: There was one tooth that was discarded.  DRAINS: None  CULTURES: None  COMPLICATIONS: None  ESTIMATED BLOOD LOSS: Less than 50 mLs.  INTRAVENOUS FLUIDS: 300 mL of normal saline solution.  INDICATIONS: The patient was recently diagnosed with recurrent mitral valve stenosis and regurgitation.  A medically necessary dental consultation was then requested to evaluate poor dentition.  The patient was examined and treatment planned for extraction tooth #30 with alveoloplasty and gross debridement of remaining dentition in the operating room with general anesthesia.  This treatment plan was formulated to decrease the risks and complications associated with dental infection from affecting the patient's systemic health and  anticipated heart valve surgery.  OPERATIVE FINDINGS: Patient was examined operating room number 12.  Tooth #30 was identified for extraction.  The patient was noted to be affected by acute pulpitis, chronic apical periodontitis, dental caries, chronic periodontitis, and Accretions.   DESCRIPTION OF PROCEDURE: Patient was brought to the main operating room number 12. Patient was then placed in the supine position on the operating table. General anesthesia was then induced per the anesthesia team. The patient was then prepped and draped in the usual manner for dental medicine procedure. A timeout was performed. The patient was identified and procedures were verified. A throat pack was placed at this time. The oral cavity was then thoroughly examined with the findings noted above. The patient was then ready for dental medicine procedure as follows:  Local anesthesia was then administered sequentially with a total utilization of  2 carpules each containing 34 mg of lidocaine with 0.017 mg of epinephrine as well as 1 carpule  each containing 9 mg bupivacaine with 0.009 mg of epinephrine.  At this point time, the mandibular right quadrant was approached. The patient was given a mandibular right inferior alveolar nerve block and long buccal nerve block utilizing the bupivacaine with epinephrine. Further infiltration was then achieved utilizing the lidocaine with epinephrine. A 15 blade incision was then made from the distal of number 32 and extended to the mesial of #29.  A surgical flap was then carefully reflected.  Tooth #30 was then elevated with a series of straight elevators.  The coronal aspect of tooth #30 was then removed with a 151 forceps leaving  the roots remaining.  Further bone was then removed utilizing a surgical handpiece and bur and copious amounts of sterile water.  The roots were then elevated out with a series of Cryer's elevators without complication.  Purulent exudate was noted to be  expressed from the buccal area through the extraction sockets at this time.  Alveoloplasty was then performed utilizing a ronguers and bone file to help achieve primary closure and remove excessive granulation tissue.  Surgical site was then irrigated with copious amounts of sterile saline x4.  The tissues were approximated and trimmed appropriately.  A piece of Surgifoam was placed in the extraction socket.  The surgical site was then closed from the distal #32 and extended the distal of #29 utilizing 3-0 chromic gut suture in a continuous interrupted suture technique x1.  An interrupted suture was then placed between tooth numbers 28/29 to further close the surgical site.    At this point in time, a gross debridement procedure was performed.  A sonic scaler was used to remove accretions.  A series of hand curettes were then used to further remove accretions.  The Sonic scaler was then again used to further refine the removal of Accretions.  This completed the gross debridement procedure.  At this point in time, the entire mouth was irrigated with copious amounts of sterile saline. The patient was examined for complications, seeing none, the dental medicine procedure was deemed to be complete. The throat pack was removed at this time. A series of 4 x 4 gauze were placed in the mouth to aid hemostasis. The patient was then handed over to the anesthesia team for final disposition. After an appropriate amount of time, the patient was extubated and taken to the postanesthsia care unit in good condition. All counts were correct for the dental medicine procedure.  Due to the presence of the purulence, the patient will be prescribed clindamycin 300 mg by mouth every 8 hours for 5 days.  Patient will utilize ibuprofen 200 mg for pain.  Patient to take 3 tablets by mouth every 6 hours for 24 hours and then as needed for persistent moderate to severe pain.  Patient may supplement pain medication with the use of Tylenol  325 mg.  Patient is to use Tylenol 325 mg-take 2 tablets by mouth every 6 hours as needed for pain.  Patient to return to dental medicine for evaluation for suture removal in 7 to 10 days.   Charlynne Panderonald F. , DDS.

## 2018-04-03 NOTE — Telephone Encounter (Signed)
04/03/2018  Patient:            Theresa Crawford Date of Birth:  Sep 05, 1981 MRN:                [id number]   Clinical social worker saw the patient concerning Expression of suicidal ideation by the patient during presurgical testing intake form on Friday, 03/31/18.  CSW suggested TTS evaluation. TTS then contacted me and indicated that they only do work through the emergency department. They recommended follow-up with psychiatric consultation.  I then contacted psychiatry nurse practitioner on call. She will forward the information to Dr. Sharma CovertNorman who will [facility].  Charlynne Panderonald F Lynwood Kubisiak, DDS Pager: [phone]

## 2018-04-03 NOTE — Transfer of Care (Signed)
Immediate Anesthesia Transfer of Care Note  Patient: Theresa Crawford  Procedure(s) Performed: Extraction of tooth #30 with alveoloplasty and gross debridement of remaining teeth (N/A )  Patient Location: PACU  Anesthesia Type:General  Level of Consciousness: awake and alert   Airway & Oxygen Therapy: Patient Spontanous Breathing and Patient connected to nasal cannula oxygen  Post-op Assessment: Report given to RN, Post -op Vital signs reviewed and stable and Patient moving all extremities X 4  Post vital signs: Reviewed and stable  Last Vitals:  Vitals Value Taken Time  BP 133/49 04/03/2018  8:30 AM  Temp    Pulse 78 04/03/2018  8:31 AM  Resp 31 04/03/2018  8:31 AM  SpO2 96 % 04/03/2018  8:31 AM  Vitals shown include unvalidated device data.  Last Pain:  Vitals:   04/03/18 0645  TempSrc: Oral  PainSc:          Complications: No apparent anesthesia complications

## 2018-04-03 NOTE — H&P (Signed)
History and Physical    Theresa Crawford ZOX:[id number] DOB: [birth year] DOA: 04/03/2018   PCP: Jerrell Belfast, [name]   Patient coming from:  Home    Chief Complaint: Awaiting behavioral health admission  HPI: Theresa Crawford is a 37 y.o. female with medical history significant for diastolic heart failure, depression, anxiety, GERD, history of mitral valve disease this post MVR, hypertension, anemia, sarcoidosis, initially admitted for tooth extraction of #30 molar, with alveoloplasty and gross debridement of remaining dentition, for planned valvular surgery in August 2019.  Prior to her procedure, on 03/31/2018, she had a presurgical testing intake form, in which she mentioned suicidal ideation.  After extraction of tooth #30 with alveoloplasty and gross debridement of remaining dentition, Dr. Kristin Bruins contacted [place], for admission, as behavioral health does not have any beds at the time.  Patient is currently postoperatively stable, and she understands that she is to be discharged to behavioral health, and agreeing with the plan. Denies fevers, chills, night sweats, vision changes, or mucositis. Denies any respiratory complaints. Denies any chest pain or palpitations. Denies lower extremity swelling. Denies nausea, heartburn or change in bowel habits. . Denies abdominal pain. Appetite is normal. Denies any dysuria. Denies abnormal skin rashes, or neuropathy. Denies any bleeding issues such as epistaxis, hematemesis, hematuria or hematochezia. Ambulating without difficulty. She denies any worsening pain at the procedure site.    ED Course:  BP (!) 113/98 (BP Location: Right Arm)   Pulse 74   Temp 97.8 F (36.6 C) (Oral)   Resp 17   LMP 03/15/2018   SpO2 96%   Sodium 140, potassium 3.1, bicarb 24, glucose 155, creatinine 1.37, white count 18.2, likely reactive postoperatively, hemoglobin 9.7, platelets 375, pregnancy test negative.  Review of Systems:  As per HPI otherwise all other systems reviewed  and are negative  Past Medical History:  Diagnosis Date  . Anxiety   . Chronic diastolic CHF (congestive heart failure) (HCC)   . Chronic diastolic congestive heart failure (HCC)   . Depression   . GERD (gastroesophageal reflux disease)   . Heart murmur   . History of open heart surgery   . Hypertension   . Mitral valve disease   . Normocytic anemia   . Obesity   . Palpitations   . Pericarditis   . Premature atrial contractions   . Pulmonary hypertension (HCC)   . PVC's (premature ventricular contractions)    a. h/o palpitations with event monitor in 03/2017 showing NSR with PACs/PVCs.  . Recurrent mitral valve stenosis and regurgitation s/p mitral valve repair   . S/P mitral valve repair 03/27/2013   Dr. Darcus Austin - [place], [place] - complex valvuloplasty including resuspension of entire posterior leaflet using Gore-tex neochords and 30 mm Edwards Physio ring annuloplasty  . Sarcoidosis   . Tobacco abuse   . Tricuspid regurgitation     Past Surgical History:  Procedure Laterality Date  . CESAREAN SECTION     X's 2  . MITRAL VALVE REPAIR  03/27/2013   Dr. Darcus Austin - [place], [place]. - complex valvuloplasty including resuspension of posterior leaflet with 30 mm CE Physio ring annuloplasty  . PARTIAL HYSTERECTOMY  2011   PID w/problem with fallopian tubes, had left fallopian and left ovary removed, pt still having periods  . TEE WITHOUT CARDIOVERSION N/A 05/26/2016   Procedure: TRANSESOPHAGEAL ECHOCARDIOGRAM (TEE);  Surgeon: [name], MD;  Location: [facility];  Service: Cardiovascular;  Laterality: N/A;  . TEE WITHOUT CARDIOVERSION N/A  01/25/2018   Procedure: TRANSESOPHAGEAL ECHOCARDIOGRAM (TEE) WITH PROPOFOL;  Surgeon: Jonelle Sidle, MD;  Location: [facility];  Service: Cardiovascular;  Laterality: N/A;    Social History Social History   Socioeconomic History  . Marital status: Single    Spouse name: Not on file  . Number of children: 2  .  Years of education: Not on file  . Highest education level: Not on file  Occupational History  . Not on file  Social Needs  . Financial resource strain: Not on file  . Food insecurity:    Worry: Not on file    Inability: Not on file  . Transportation needs:    Medical: Not on file    Non-medical: Not on file  Tobacco Use  . Smoking status: Current Some Day Smoker    Packs/day: 0.25    Types: Cigarettes    Start date: 11/03/1999  . Smokeless tobacco: Never Used  . Tobacco comment: quit since seen Dr. Cornelius Moras "last week"  Substance and Sexual Activity  . Alcohol use: Yes    Alcohol/week: 0.0 oz    Comment: beer and wine socially, not recently  . Drug use: No  . Sexual activity: Not on file  Lifestyle  . Physical activity:    Days per week: Not on file    Minutes per session: Not on file  . Stress: Not on file  Relationships  . Social connections:    Talks on phone: Not on file    Gets together: Not on file    Attends religious service: Not on file    Active member of club or organization: Not on file    Attends meetings of clubs or organizations: Not on file    Relationship status: Not on file  . Intimate partner violence:    Fear of current or ex partner: Not on file    Emotionally abused: Not on file    Physically abused: Not on file    Forced sexual activity: Not on file  Other Topics Concern  . Not on file  Social History Narrative  . Not on file     Allergies  Allergen Reactions  . Bee Venom Anaphylaxis  . Lisinopril Shortness Of Breath, Swelling and Other (See Comments)    Throat swelling  . Penicillins Shortness Of Breath, Swelling and Other (See Comments)    Has patient had a PCN reaction causing immediate rash, facial/tongue/throat swelling, SOB or lightheadedness with hypotension: Yes Has patient had a PCN reaction causing severe rash involving mucus membranes or skin necrosis: Yes Has patient had a PCN reaction that required hospitalization No Has  patient had a PCN reaction occurring within the last 10 years: No If all of the above answers are "NO", then may proceed with Cephalosporin use.   Marland Kitchen Perflutren Lipid Microsphere Shortness Of Breath and Other (See Comments)    Other reaction(s): Chest Pain/Tightness  . Shellfish Allergy Anaphylaxis  . Contrast Media [Iodinated Diagnostic Agents] Hives, Itching and Other (See Comments)    Itching and hives to throat, neck, face and arms.   No difficulty breathing.   This was given at [facility] approximately 2015. Contrast Dye    Family History  Problem Relation Age of Onset  . Heart failure Father        just received LVAD  . Heart disease Paternal Grandfather        stent placement       Prior to Admission medications   Medication  Sig Start Date End Date Taking? Authorizing Provider  albuterol (PROVENTIL HFA;VENTOLIN HFA) 108 (90 Base) MCG/ACT inhaler Inhale 2 puffs into the lungs every 6 (six) hours as needed for wheezing or shortness of breath.   Yes [provider]  citalopram (CELEXA) 40 MG tablet Take 40 mg by mouth 2 (two) times daily.   Yes [provider]  docusate sodium (COLACE) 100 MG capsule Take 100 mg by mouth daily as needed for moderate constipation.    Yes [provider]  ferrous sulfate 325 (65 FE) MG tablet Take 1 tablet (325 mg total) by mouth 2 (two) times daily with a meal. Patient taking differently: Take 325 mg by mouth daily with breakfast.  03/08/18  Yes Philip Aspen, Limmie Patricia, MD  metoprolol succinate (TOPROL-XL) 25 MG 24 hr tablet TAKE 1 TABLET BY MOUTH DAILY Patient taking differently: Take 37.5 mg by mouth once daily 02/09/18  Yes Branch, Dorothe Pea, MD  potassium chloride SA (K-DUR,KLOR-CON) 20 MEQ tablet Take 2 tablets (40 mEq total) by mouth daily. Patient taking differently: Take 40 mEq by mouth 2 (two) times daily.  01/12/18 03/29/18 Yes Erick Blinks, MD  Probiotic Product (PROBIOTIC PO) Take 1 capsule by  mouth daily.   Yes [provider]  QUEtiapine (SEROQUEL) 25 MG tablet Take 25 mg by mouth at bedtime.   Yes [provider]  torsemide (DEMADEX) 20 MG tablet Take 20 mg by mouth 2 (two) times daily.   Yes [provider]  clindamycin (CLEOCIN) 300 MG capsule Take 1 capsule by mouth every 8 hours for 5 days. 04/03/18   Charlynne Pander, DDS  EPINEPHrine (EPIPEN 2-PAK) 0.3 mg/0.3 mL IJ SOAJ injection Inject 0.3 Units into the muscle once.     [provider]     Physical Exam:  Vitals:   04/03/18 1147 04/03/18 1200 04/03/18 1742 04/03/18 1807  BP: (!) 94/53  116/70 (!) 113/98  Pulse: 70 65 78 74  Resp: (!) 26 (!) 26  17  Temp:   (!) 97.5 F (36.4 C) 97.8 F (36.6 C)  TempSrc:    Oral  SpO2: 95% 95% 100% 96%   Constitutional: NAD, calm, comfortable,   Eyes: PERRL, lids and conjunctivae normal ENMT: Mucous membranes are moist,right #30 molar without significant erythema or areas of bleeding.  Neck: normal, supple, no masses, no thyromegaly Respiratory: clear to auscultation bilaterally, no wheezing, no crackles. Normal respiratory effort  Cardiovascular: Regular rate and rhythm,  murmur, rubs or gallops. No extremity edema. 2+ pedal pulses. No carotid bruits.  Abdomen: Soft, non tender, No hepatosplenomegaly. Bowel sounds positive.  Musculoskeletal: no clubbing / cyanosis. Moves all extremities Skin: no jaundice, No lesions.  Neurologic: Sensation intact  Strength equal in all extremities Psychiatric:   Alert and oriented x 3. Normal mood at this time.     Labs on Admission: I have personally reviewed following labs and imaging studies  CBC: Recent Labs  Lab 03/31/18 1349  WBC 18.2*  HGB 9.7*  HCT 31.9*  MCV 80.6  PLT 375    Basic Metabolic Panel: Recent Labs  Lab 03/31/18 1349  NA 140  K 3.1*  CL 104  CO2 24  GLUCOSE 155*  BUN 15  CREATININE 1.37*  CALCIUM 8.9    GFR: Estimated Creatinine Clearance: 63.5 mL/min (A) (by  C-G formula based on SCr of 1.37 mg/dL (H)).  Liver Function Tests: No results for input(s): AST, ALT, ALKPHOS, BILITOT, PROT, ALBUMIN in the last 168 hours.  No results for input(s): LIPASE, AMYLASE in the last 168 hours. No results for input(s): AMMONIA in the last 168 hours.  Coagulation Profile: No results for input(s): INR, PROTIME in the last 168 hours.  Cardiac Enzymes: No results for input(s): CKTOTAL, CKMB, CKMBINDEX, TROPONINI in the last 168 hours.  BNP (last 3 results) No results for input(s): PROBNP in the last 8760 hours.  HbA1C: No results for input(s): HGBA1C in the last 72 hours.  CBG: No results for input(s): GLUCAP in the last 168 hours.  Lipid Profile: No results for input(s): CHOL, HDL, LDLCALC, TRIG, CHOLHDL, LDLDIRECT in the last 72 hours.  Thyroid Function Tests: No results for input(s): TSH, T4TOTAL, FREET4, T3FREE, THYROIDAB in the last 72 hours.  Anemia Panel: No results for input(s): VITAMINB12, FOLATE, FERRITIN, TIBC, IRON, RETICCTPCT in the last 72 hours.  Urine analysis:    Component Value Date/Time   COLORURINE COLORLESS (A) 03/07/2018 1106   APPEARANCEUR CLEAR 03/07/2018 1106   LABSPEC 1.004 (L) 03/07/2018 1106   PHURINE 8.0 03/07/2018 1106   GLUCOSEU NEGATIVE 03/07/2018 1106   HGBUR NEGATIVE 03/07/2018 1106   BILIRUBINUR NEGATIVE 03/07/2018 1106   KETONESUR NEGATIVE 03/07/2018 1106   PROTEINUR NEGATIVE 03/07/2018 1106   NITRITE NEGATIVE 03/07/2018 1106   LEUKOCYTESUR NEGATIVE 03/07/2018 1106    Sepsis Labs: @LABRCNTIP (procalcitonin:4,lacticidven:4) )No results found for this or any previous visit (from the past 240 hour(s)).   Radiological Exams on Admission: No results found.  EKG: Independently reviewed.  Assessment/Plan Principal Problem:   MDD (major depressive disorder) Active Problems:   Essential hypertension   Depression with anxiety   Recurrent mitral valve stenosis and regurgitation s/p mitral valve repair    Prolonged QT interval   Chronic diastolic congestive heart failure (HCC)   S/P tooth extraction   Suicidal ideation   Major depressive disorder, will need suicidal ideation, no psychosis. Patient has consulted by psychiatry, Dr. Sharma CovertNorman, concluding that the patient warrants inpatient psychiatric hospitalization, given high risk of harm to self.  Unfortunately, there are no behavioral health beds, so that she will remain hospitalized here until a bed is available.   Continue bedside sitter Continue home medications with Celexa 40 mg twice daily, and Seroquel has been increased from 25, to 50 mg nightly for insomnia and anxiety Appreciate psychiatry involvement.  Status post #30 tooth extraction, Dr. Valentino HueKalinsky.  She has been receiving clindamycin IV.  Dr. Valentino HueKalinsky recommends to continue with clindamycin 600 mg p.o every 8 hours. for 5 days Continue pain medications, including Toradol.  History of recurrent mitral valve stenosis and regurgitation, status post mitral valve repair.  The patient is to undergo another revision and repair in August 2019. At this time, her vital signs are stable, she is afebrile, her EKG does not show any changes.   If any changes are noted, will consult  TTS for further evaluation and recommendations.   Hypertension BP 113/98  Pulse 74   Continue home anti-hypertensive medications    COPD without exacerbation  Continue nebs and O2 prn   Anemia of Chronic disease, hemoglobin is 9.7, no bleeding issues other than recent extraction Repeat CBC in a.m.   Hypokalemia, potassium was 3.1.  Patient has not taking her current Ksdur Resume Kdur 40 daily    DVT prophylaxis:  SCD  Code Status:    Full  Family Communication:  Discussed with patient Disposition Plan: Expect patient to be discharged to home after condition improves Consults called:    Psychiatry Admission status: IP Medsurg  Marlowe Kays, PA-C [facility]   Amion text   [id number]   04/03/2018, 6:38 PM

## 2018-04-03 NOTE — Progress Notes (Signed)
Dr Kristin BruinsKulinski notified of patient's CSSR. Order for social work consult prior to patient discharge.

## 2018-04-04 ENCOUNTER — Other Ambulatory Visit: Payer: Self-pay

## 2018-04-04 ENCOUNTER — Encounter (HOSPITAL_COMMUNITY): Payer: Self-pay | Admitting: Dentistry

## 2018-04-04 DIAGNOSIS — I052 Rheumatic mitral stenosis with insufficiency: Secondary | ICD-10-CM

## 2018-04-04 DIAGNOSIS — Z01818 Encounter for other preprocedural examination: Secondary | ICD-10-CM

## 2018-04-04 DIAGNOSIS — K08199 Complete loss of teeth due to other specified cause, unspecified class: Secondary | ICD-10-CM

## 2018-04-04 LAB — BASIC METABOLIC PANEL
Anion gap: 8 (ref 5–15)
BUN: 23 mg/dL — AB (ref 6–20)
CALCIUM: 8.7 mg/dL — AB (ref 8.9–10.3)
CO2: 26 mmol/L (ref 22–32)
Chloride: 104 mmol/L (ref 98–111)
Creatinine, Ser: 1.05 mg/dL — ABNORMAL HIGH (ref 0.44–1.00)
GFR calc non Af Amer: >60 mL/min (ref >60)
Glucose, Bld: 131 mg/dL — ABNORMAL HIGH (ref 70–99)
Potassium: 4.2 mmol/L (ref 3.5–5.1)
SODIUM: 138 mmol/L (ref 135–145)

## 2018-04-04 LAB — CBC
HCT: 29.2 % — ABNORMAL LOW (ref 36.0–46.0)
Hemoglobin: 8.9 g/dL — ABNORMAL LOW (ref 12.0–15.0)
MCH: 23.9 pg — AB (ref 26.0–34.0)
MCHC: 30.5 g/dL (ref 30.0–36.0)
MCV: 78.5 fL (ref 78.0–100.0)
PLATELETS: 404 10*3/uL — AB (ref 150–400)
RBC: 3.72 MIL/uL — AB (ref 3.87–5.11)
RDW: 19 % — AB (ref 11.5–15.5)
WBC: 12.1 10*3/uL — ABNORMAL HIGH (ref 4.0–10.5)

## 2018-04-04 LAB — HIV ANTIBODY (ROUTINE TESTING W REFLEX): HIV SCREEN 4TH GENERATION: NONREACTIVE

## 2018-04-04 MED ORDER — CLINDAMYCIN HCL 300 MG PO CAPS
300.0000 mg | ORAL_CAPSULE | Freq: Three times a day (TID) | ORAL | Status: DC
  Administered 2018-04-04 – 2018-04-05 (×4): 300 mg via ORAL
  Filled 2018-04-04 (×4): qty 1

## 2018-04-04 NOTE — Discharge Summary (Signed)
Physician Discharge Summary  Theresa Crawford JXB:[id number] DOB: 10/09/1980 DOA: 04/03/2018  PCP: Jerrell Belfast, [name]  Admit date: 04/03/2018 Discharge date: 04/04/2018  Admitted From: Home  Disposition:  Inpatient psych  Recommendations for Outpatient Follow-up and new medication changes:  1. Follow with inpatient psych recommendations  2. Continue cardiac medications including torsemide and potassium supplements.   Home Health: no   Equipment/Devices:  No    Discharge Condition: stable  CODE STATUS: full  Diet recommendation: Regular   Brief/Interim Summary: 37 year old female who presented with suicidal thoughts.  She does have the significant past medical history for diastolic heart failure, depression, anxiety, GERD, history of mitral valve prolapse status post mitral valve repair, hypertension, anemia, and sarcoidosis.  Patient was admitted to the hospital for #30 molar extraction with alveoloplasty and gross debridement.  In the perioperative she expressed suicidal thoughts.  On the initial physical examination blood pressure 116/70, heart rate 78, respiratory rate 26, temperature 97.5, oxygen saturation 96%.  Moist mucous membranes, lungs clear to auscultation bilaterally, heart S1-S2 present and rhythmic, systolic murmur at the apex, 3 out of 6, abdomen protuberant, no lower extremity edema.  Sodium 138, potassium 4.2, chloride 104, bicarb 26, glucose 131, BUN 23, creatinine 1.0, white count 12.1, hemoglobin 8.9, hematocrit 29.2, platelets 404, negative pregnancy test.  Normal sinus rhythm, normal axis, borderline prolonged QTc 487.  Patient was admitted to the hospital with a working diagnosis of suicidal ideations.  1.  Suicidal ideations.  Patient was admitted to the medical ward, placed on one-to-one suicidal precautions, continue Celexa, and Seroquel.  Patient was seen by psychiatry, Dr. Sharma Covert recommended inpatient psych admission.  2.  Mitral valve prolapse status post repair  with chronic diastolic heart failure.  No clinical signs of heart failure, her QT has a borderline prolongation two 487, commended to use caution with QT prolonging medications.  Continue torsemide 20 mg daily.  Continue metoprolol 12.5 mg daily.  3.  Hypertension.  Blood pressure remained stable during her hospitalization.  4.  Sarcoidosis. stable with no signs of exacerbation.  5.  Chronic multifactorial anemia with iron deficiency.  Hemoglobin and hematocrit stable, follow-up as an outpatient.  Continue iron supplements.  Discharge Diagnoses:  Principal Problem:   MDD (major depressive disorder) Active Problems:   Essential hypertension   Depression with anxiety   Recurrent mitral valve stenosis and regurgitation s/p mitral valve repair   Prolonged QT interval   Chronic diastolic congestive heart failure (HCC)   S/P tooth extraction   Suicidal ideation    Discharge Instructions   Allergies as of 04/04/2018      Reactions   Bee Venom Anaphylaxis   Lisinopril Shortness Of Breath, Swelling, Other (See Comments)   Throat swelling   Penicillins Shortness Of Breath, Swelling, Other (See Comments)   Has patient had a PCN reaction causing immediate rash, facial/tongue/throat swelling, SOB or lightheadedness with hypotension: Yes Has patient had a PCN reaction causing severe rash involving mucus membranes or skin necrosis: Yes Has patient had a PCN reaction that required hospitalization No Has patient had a PCN reaction occurring within the last 10 years: No If all of the above answers are "NO", then may proceed with Cephalosporin use.   Perflutren Lipid Microsphere Shortness Of Breath, Other (See Comments)   Other reaction(s): Chest Pain/Tightness   Shellfish Allergy Anaphylaxis   Contrast Media [iodinated Diagnostic Agents] Hives, Itching, Other (See Comments)   Itching and hives to throat, neck, face and arms.   No  difficulty breathing.   This [facility]. Contrast Dye      Medication List    TAKE these medications   albuterol 108 (90 Base) MCG/ACT inhaler Commonly known as:  PROVENTIL HFA;VENTOLIN HFA Inhale 2 puffs into the lungs every 6 (six) hours as needed for wheezing or shortness of breath.   citalopram 40 MG tablet Commonly known as:  CELEXA Take 40 mg by mouth 2 (two) times daily.   clindamycin 300 MG capsule Commonly known as:  CLEOCIN Take 1 capsule by mouth every 8 hours for 5 days.   docusate sodium 100 MG capsule Commonly known as:  COLACE Take 100 mg by mouth daily as needed for moderate constipation.   EPIPEN 2-PAK 0.3 mg/0.3 mL Soaj injection Generic drug:  EPINEPHrine Inject 0.3 Units into the muscle once.   ferrous sulfate 325 (65 FE) MG tablet Take 1 tablet (325 mg total) by mouth 2 (two) times daily with a meal. What changed:  when to take this   metoprolol succinate 25 MG 24 hr tablet Commonly known as:  TOPROL-XL TAKE 1 TABLET BY MOUTH DAILY What changed:    how much to take  how to take this  when to take this   potassium chloride SA 20 MEQ tablet Commonly known as:  K-DUR,KLOR-CON Take 2 tablets (40 mEq total) by mouth daily. What changed:  when to take this   PROBIOTIC PO Take 1 capsule by mouth daily.   QUEtiapine 25 MG tablet Commonly known as:  SEROQUEL Take 25 mg by mouth at bedtime.   torsemide 20 MG tablet Commonly known as:  DEMADEX Take 20 mg by mouth 2 (two) times daily.      Follow-up Information    Call Charlynne PanderKulinski, Ronald F, DDS.   Specialty:  Dentistry Why:  Call dental medicine for appointment for suture removal in 7 to 10 days. Contact information: [address] [place] [id number] [phone]          Allergies  Allergen Reactions  . Bee Venom Anaphylaxis  . Lisinopril Shortness Of Breath, Swelling and Other (See Comments)    Throat swelling  . Penicillins Shortness Of Breath, Swelling and Other (See Comments)    Has  patient had a PCN reaction causing immediate rash, facial/tongue/throat swelling, SOB or lightheadedness with hypotension: Yes Has patient had a PCN reaction causing severe rash involving mucus membranes or skin necrosis: Yes Has patient had a PCN reaction that required hospitalization No Has patient had a PCN reaction occurring within the last 10 years: No If all of the above answers are "NO", then may proceed with Cephalosporin use.   Marland Kitchen. Perflutren Lipid Microsphere Shortness Of Breath and Other (See Comments)    Other reaction(s): Chest Pain/Tightness  . Shellfish Allergy Anaphylaxis  . Contrast Media [Iodinated Diagnostic Agents] Hives, Itching and Other (See Comments)    Itching and hives to throat, neck, face and arms.   No difficulty breathing.   This was given [facility] 2015. Contrast Dye    Consultations:  Psych   Procedures/Studies: Dg Chest 2 View  Result Date: 03/07/2018 CLINICAL DATA:  37 year old female with shortness of breath. EXAM: CHEST - 2 VIEW COMPARISON:  Chest CT dated 01/11/2018 FINDINGS: There is cardiomegaly with vascular congestion and probable mild edema. Superimposed pneumonia is not excluded. Clinical correlation is recommended. There is no focal consolidation, pleural effusion, or pneumothorax. Median sternotomy wires. No acute osseous pathology. IMPRESSION:  Cardiomegaly with mild vascular congestion. Pneumonia is not excluded. Clinical correlation is recommended. Electronically Signed   By: [name] M.D.   On: 03/07/2018 06:40   US Venous Img Lower Bilateral  Result Date: 03/07/2018 CLINICAL DATA:  37 year old female with a history of bilateral leg pain EXAM: BILATERAL LOWER EXTREMITY VENOUS DOPPLER ULTRASOUND TECHNIQUE: Gray-scale sonography with graded compression, as well as color Doppler and duplex ultrasound were performed to evaluate the lower extremity deep venous systems from the level of the common femoral vein  and including the common femoral, femoral, profunda femoral, popliteal and calf veins including the posterior tibial, peroneal and gastrocnemius veins when visible. The superficial great saphenous vein was also interrogated. Spectral Doppler was utilized to evaluate flow at rest and with distal augmentation maneuvers in the common femoral, femoral and popliteal veins. COMPARISON:  None. FINDINGS: RIGHT LOWER EXTREMITY Common Femoral Vein: No evidence of thrombus. Normal compressibility, respiratory phasicity and response to augmentation. Saphenofemoral Junction: No evidence of thrombus. Normal compressibility and flow on color Doppler imaging. Profunda Femoral Vein: No evidence of thrombus. Normal compressibility and flow on color Doppler imaging. Femoral Vein: No evidence of thrombus. Normal compressibility, respiratory phasicity and response to augmentation. Popliteal Vein: No evidence of thrombus. Normal compressibility, respiratory phasicity and response to augmentation. Calf Veins: No evidence of thrombus. Normal compressibility and flow on color Doppler imaging. Superficial Great Saphenous Vein: No evidence of thrombus. Normal compressibility and flow on color Doppler imaging. Other Findings:  None. LEFT LOWER EXTREMITY Common Femoral Vein: No evidence of thrombus. Normal compressibility, respiratory phasicity and response to augmentation. Saphenofemoral Junction: No evidence of thrombus. Normal compressibility and flow on color Doppler imaging. Profunda Femoral Vein: No evidence of thrombus. Normal compressibility and flow on color Doppler imaging. Femoral Vein: No evidence of thrombus. Normal compressibility, respiratory phasicity and response to augmentation. Popliteal Vein: No evidence of thrombus. Normal compressibility, respiratory phasicity and response to augmentation. Calf Veins: No evidence of thrombus. Normal compressibility and flow on color Doppler imaging. Superficial Great Saphenous Vein: No  evidence of thrombus. Normal compressibility and flow on color Doppler imaging. Other Findings:  None. IMPRESSION: Sonographic survey of the bilateral lower extremities negative for DVT Electronically Signed   By: Gilmer Mor D.O.   On: 03/07/2018 10:35   Ct Angio Chest Aorta W/cm &/or Wo/cm  Result Date: 03/30/2018 CLINICAL DATA:  37 year old female with a history of cardiac valve dysfunction, preoperative mitral valve/tricuspid valve repair. Patient has a history of sarcoidosis EXAM: CT ANGIOGRAPHY CHEST, ABDOMEN AND PELVIS TECHNIQUE: Multidetector CT imaging through the chest, abdomen and pelvis was performed using the standard protocol during bolus administration of intravenous contrast. Multiplanar reconstructed images and MIPs were obtained and reviewed to evaluate the vascular anatomy. CONTRAST:  75mL ISOVUE-370 IOPAMIDOL (ISOVUE-370) INJECTION 76% COMPARISON:  01/11/2018, 05/23/2016 FINDINGS: CTA CHEST FINDINGS Cardiovascular: Heart: Median sternotomy and prior mitral valve annuloplasty. Heart size within normal limits. No pericardial fluid/thickening. There are 4 figure-of-eight sternal wires in place. The right atrium and proximal pulmonary outflow track is 3 mm-4 mm subjacent to the top of the third sternal wire. The right atrium is inseparable from the sternum/median sternotomy site at the overlap of the third and fourth sternal wire. Right atrium is inseparable from the median sternotomy site/fourth sternal wire. No significant coronary calcifications identified. Aorta: Unremarkable course, caliber, contour of the thoracic aorta. No aneurysm or dissection flap. No periaortic fluid. Four vessel arch with a separate origin of the left vertebral artery. No significant atherosclerotic changes  or aneurysm. Pulmonary arteries: No central, lobar, segmental, or proximal subsegmental filling defects. Mediastinum/Nodes: CT again demonstrates multiple mediastinal lymph nodes, throughout the nodal stations  extending to the lowest paratracheal and subcarinal nodal stations. Partial calcification of lymph nodes in the right paratracheal nodal station. Overall, the size and number unchanged from comparison CT. Unremarkable appearance of the thoracic esophagus. Unremarkable appearance of the thoracic inlet. Lungs/Pleura: Thickening of the fissures bilaterally, with persistent fluid/thickening of the left greater than right dependent pleural spaces. Persistent diffuse geometric ground-glass opacity of the bilateral lungs. Mild interlobular septal thickening. No pneumothorax. Musculoskeletal: No acute displaced fracture.  Degenerative changes. Review of the MIP images confirms the above findings. CTA ABDOMEN AND PELVIS FINDINGS VASCULAR Aorta: Unremarkable course, caliber, contour of the abdominal aorta. No dissection, aneurysm, or periaortic fluid. Celiac: Celiac artery patent without significant atherosclerotic changes. Branches are patent SMA: SMA patent without significant atherosclerotic changes. Branches are patent. Renals: Bilateral renal arteries patent. IMA: IMA patent. Right lower extremity: Unremarkable course, caliber, and contour of the right iliac system. No aneurysm, dissection, or occlusion. Hypogastric artery is patent. Anterior and posterior division patent. Common femoral artery patent. Proximal SFA and profunda femoris patent. Left lower extremity: Unremarkable course, caliber, and contour of the left iliac system. No aneurysm, dissection, or occlusion. Hypogastric artery is patent. Anterior and posterior division patent. Common femoral artery patent. Proximal SFA and profunda femoris patent. Veins: Unremarkable appearance of the venous system. Review of the MIP images confirms the above findings. NON-VASCULAR Hepatobiliary: Cranial caudal span of the right liver measures 16-17 cm. Unremarkable gallbladder which is relatively decompressed. Pancreas: Unremarkable pancreas Spleen: Coarse calcifications of  spleen parenchyma, likely representing prior granulomatous disease. Adrenals/Urinary Tract: Unremarkable adrenal glands Right: No hydronephrosis. Symmetric perfusion to the left. No nephrolithiasis. Unremarkable course of the right ureter. Left: No hydronephrosis. Symmetric perfusion to the right. No nephrolithiasis. Unremarkable course of the left ureter. Unremarkable appearance of the urinary bladder . Stomach/Bowel: Unremarkable appearance of the stomach. Unremarkable appearance of small bowel. No evidence of obstruction. No colonic diverticula. Appendix is not visualized, however, no inflammatory changes are present adjacent to the cecum to indicate an appendicitis. Lymphatic: No abdominal lymphadenopathy Mesenteric: No free fluid or air. No adenopathy. Reproductive: Unremarkable appearance of the pelvic organs. Other: No hernia. Musculoskeletal: No evidence of acute fracture. No bony canal narrowing. No significant degenerative changes of the hips. . IMPRESSION: Diffuse ground-glass opacities of the bilateral lungs, are relatively improved when compared to the prior CT of 01/11/2018 and 05/23/2016. Given there is only mild interlobular septal thickening and pleural thickening, the findings are favored to represent a combination of improved/mild pulmonary edema, as well as sequela of the patient's known sarcoidosis. Atypical pneumonia cannot be excluded, though is not favored. Redemonstration of mediastinal lymphadenopathy, unchanged from comparison studies, again most likely relating to the patient's known sarcoidosis. Surgical changes of median sternotomy and mitral valve annuloplasty. Note that the right atrium and proximal pulmonary outflow tract are immediately subjacent to the sternal wires, as above. Electronically Signed   By: Gilmer Mor D.O.   On: 03/30/2018 13:48   Ct Angio Abd/pel W/ And/or W/o  Result Date: 03/30/2018 CLINICAL DATA:  37 year old female with a history of cardiac valve  dysfunction, preoperative mitral valve/tricuspid valve repair. Patient has a history of sarcoidosis EXAM: CT ANGIOGRAPHY CHEST, ABDOMEN AND PELVIS TECHNIQUE: Multidetector CT imaging through the chest, abdomen and pelvis was performed using the standard protocol during bolus administration of intravenous contrast. Multiplanar reconstructed images and  MIPs were obtained and reviewed to evaluate the vascular anatomy. CONTRAST:  75mL ISOVUE-370 IOPAMIDOL (ISOVUE-370) INJECTION 76% COMPARISON:  01/11/2018, 05/23/2016 FINDINGS: CTA CHEST FINDINGS Cardiovascular: Heart: Median sternotomy and prior mitral valve annuloplasty. Heart size within normal limits. No pericardial fluid/thickening. There are 4 figure-of-eight sternal wires in place. The right atrium and proximal pulmonary outflow track is 3 mm-4 mm subjacent to the top of the third sternal wire. The right atrium is inseparable from the sternum/median sternotomy site at the overlap of the third and fourth sternal wire. Right atrium is inseparable from the median sternotomy site/fourth sternal wire. No significant coronary calcifications identified. Aorta: Unremarkable course, caliber, contour of the thoracic aorta. No aneurysm or dissection flap. No periaortic fluid. Four vessel arch with a separate origin of the left vertebral artery. No significant atherosclerotic changes or aneurysm. Pulmonary arteries: No central, lobar, segmental, or proximal subsegmental filling defects. Mediastinum/Nodes: CT again demonstrates multiple mediastinal lymph nodes, throughout the nodal stations extending to the lowest paratracheal and subcarinal nodal stations. Partial calcification of lymph nodes in the right paratracheal nodal station. Overall, the size and number unchanged from comparison CT. Unremarkable appearance of the thoracic esophagus. Unremarkable appearance of the thoracic inlet. Lungs/Pleura: Thickening of the fissures bilaterally, with persistent fluid/thickening of  the left greater than right dependent pleural spaces. Persistent diffuse geometric ground-glass opacity of the bilateral lungs. Mild interlobular septal thickening. No pneumothorax. Musculoskeletal: No acute displaced fracture.  Degenerative changes. Review of the MIP images confirms the above findings. CTA ABDOMEN AND PELVIS FINDINGS VASCULAR Aorta: Unremarkable course, caliber, contour of the abdominal aorta. No dissection, aneurysm, or periaortic fluid. Celiac: Celiac artery patent without significant atherosclerotic changes. Branches are patent SMA: SMA patent without significant atherosclerotic changes. Branches are patent. Renals: Bilateral renal arteries patent. IMA: IMA patent. Right lower extremity: Unremarkable course, caliber, and contour of the right iliac system. No aneurysm, dissection, or occlusion. Hypogastric artery is patent. Anterior and posterior division patent. Common femoral artery patent. Proximal SFA and profunda femoris patent. Left lower extremity: Unremarkable course, caliber, and contour of the left iliac system. No aneurysm, dissection, or occlusion. Hypogastric artery is patent. Anterior and posterior division patent. Common femoral artery patent. Proximal SFA and profunda femoris patent. Veins: Unremarkable appearance of the venous system. Review of the MIP images confirms the above findings. NON-VASCULAR Hepatobiliary: Cranial caudal span of the right liver measures 16-17 cm. Unremarkable gallbladder which is relatively decompressed. Pancreas: Unremarkable pancreas Spleen: Coarse calcifications of spleen parenchyma, likely representing prior granulomatous disease. Adrenals/Urinary Tract: Unremarkable adrenal glands Right: No hydronephrosis. Symmetric perfusion to the left. No nephrolithiasis. Unremarkable course of the right ureter. Left: No hydronephrosis. Symmetric perfusion to the right. No nephrolithiasis. Unremarkable course of the left ureter. Unremarkable appearance of the  urinary bladder . Stomach/Bowel: Unremarkable appearance of the stomach. Unremarkable appearance of small bowel. No evidence of obstruction. No colonic diverticula. Appendix is not visualized, however, no inflammatory changes are present adjacent to the cecum to indicate an appendicitis. Lymphatic: No abdominal lymphadenopathy Mesenteric: No free fluid or air. No adenopathy. Reproductive: Unremarkable appearance of the pelvic organs. Other: No hernia. Musculoskeletal: No evidence of acute fracture. No bony canal narrowing. No significant degenerative changes of the hips. . IMPRESSION: Diffuse ground-glass opacities of the bilateral lungs, are relatively improved when compared to the prior CT of 01/11/2018 and 05/23/2016. Given there is only mild interlobular septal thickening and pleural thickening, the findings are favored to represent a combination of improved/mild pulmonary edema, as well as sequela of  the patient's known sarcoidosis. Atypical pneumonia cannot be excluded, though is not favored. Redemonstration of mediastinal lymphadenopathy, unchanged from comparison studies, again most likely relating to the patient's known sarcoidosis. Surgical changes of median sternotomy and mitral valve annuloplasty. Note that the right atrium and proximal pulmonary outflow tract are immediately subjacent to the sternal wires, as above. Electronically Signed   By: Gilmer Mor D.O.   On: 03/30/2018 13:48       Subjective: Patient is feeling well, no nausea or vomiting, no chest pain or dyspnea, with sitter one to one.   Discharge Exam: Vitals:   04/03/18 1807 04/04/18 0458  BP: (!) 113/98 118/67  Pulse: 74 79  Resp: 17 20  Temp: 97.8 F (36.6 C) 98.3 F (36.8 C)  SpO2: 96% 96%   Vitals:   04/03/18 1742 04/03/18 1807 04/04/18 0458 04/04/18 0849  BP: 116/70 (!) 113/98 118/67   Pulse: 78 74 79   Resp:  17 20   Temp: (!) 97.5 F (36.4 C) 97.8 F (36.6 C) 98.3 F (36.8 C)   TempSrc:  Oral Oral    SpO2: 100% 96% 96%   Weight:    102.5 kg (226 lb)    General: Not in pain or dyspnea.  Neurology: Awake and alert, non focal  E ENT: no pallor, no icterus, oral mucosa moist Cardiovascular: No JVD. S1-S2 present, rhythmic, no gallops, rubs, positive 3/6 systolic murmur at the apex. No lower extremity edema. Pulmonary: vesicular breath sounds bilaterally, adequate air movement, no wheezing, rhonchi or rales. Gastrointestinal. Abdomen protuberant  no organomegaly, non tender, no rebound or guarding Skin. No rashes Musculoskeletal: no joint deformities   The results of significant diagnostics from this hospitalization (including imaging, microbiology, ancillary and laboratory) are listed below for reference.     Microbiology: No results found for this or any previous visit (from the past 240 hour(s)).   Labs: BNP (last 3 results) Recent Labs    01/06/18 1006 01/11/18 0420 03/07/18 0450  BNP 314.0* 383.0* 333.0*   Basic Metabolic Panel: Recent Labs  Lab 03/31/18 1349 04/04/18 0640  NA 140 138  K 3.1* 4.2  CL 104 104  CO2 24 26  GLUCOSE 155* 131*  BUN 15 23*  CREATININE 1.37* 1.05*  CALCIUM 8.9 8.7*   Liver Function Tests: No results for input(s): AST, ALT, ALKPHOS, BILITOT, PROT, ALBUMIN in the last 168 hours. No results for input(s): LIPASE, AMYLASE in the last 168 hours. No results for input(s): AMMONIA in the last 168 hours. CBC: Recent Labs  Lab 03/31/18 1349 04/04/18 0640  WBC 18.2* 12.1*  HGB 9.7* 8.9*  HCT 31.9* 29.2*  MCV 80.6 78.5  PLT 375 404*   Cardiac Enzymes: No results for input(s): CKTOTAL, CKMB, CKMBINDEX, TROPONINI in the last 168 hours. BNP: Invalid input(s): POCBNP CBG: No results for input(s): GLUCAP in the last 168 hours. D-Dimer No results for input(s): DDIMER in the last 72 hours. Hgb A1c No results for input(s): HGBA1C in the last 72 hours. Lipid Profile No results for input(s): CHOL, HDL, LDLCALC, TRIG, CHOLHDL, LDLDIRECT in  the last 72 hours. Thyroid function studies No results for input(s): TSH, T4TOTAL, T3FREE, THYROIDAB in the last 72 hours.  Invalid input(s): FREET3 Anemia work up No results for input(s): VITAMINB12, FOLATE, FERRITIN, TIBC, IRON, RETICCTPCT in the last 72 hours. Urinalysis    Component Value Date/Time   COLORURINE COLORLESS (A) 03/07/2018 1106   APPEARANCEUR CLEAR 03/07/2018 1106   LABSPEC 1.004 (L) 03/07/2018 1106  PHURINE 8.0 03/07/2018 1106   GLUCOSEU NEGATIVE 03/07/2018 1106   HGBUR NEGATIVE 03/07/2018 1106   BILIRUBINUR NEGATIVE 03/07/2018 1106   KETONESUR NEGATIVE 03/07/2018 1106   PROTEINUR NEGATIVE 03/07/2018 1106   NITRITE NEGATIVE 03/07/2018 1106   LEUKOCYTESUR NEGATIVE 03/07/2018 1106   [facility] Invalid input(s): PROCALCITONIN,  WBC,  LACTICIDVEN Microbiology No results found for this or any previous visit (from the past 240 hour(s)).   Time coordinating discharge: 45 minutes  SIGNED:   Coralie Keens, MD  [facility] 04/04/2018, 12:45 PM Pager [phone]  If 7PM-7AM, please contact night-coverage [email or web address] Password [email or web address]

## 2018-04-04 NOTE — Plan of Care (Signed)
  Problem: Education: Goal: Knowledge of General Education information will improve Description: Including pain rating scale, medication(s)/side effects and non-pharmacologic comfort measures Outcome: Progressing   Problem: Clinical Measurements: Goal: Respiratory complications will improve Outcome: Progressing Goal: Cardiovascular complication will be avoided Outcome: Progressing   Problem: Pain Managment: Goal: General experience of comfort will improve Outcome: Progressing   Problem: Safety: Goal: Ability to remain free from injury will improve Outcome: Progressing   

## 2018-04-04 NOTE — Progress Notes (Signed)
POST OPERATIVE NOTE:  04/04/2018 Theresa Crawford Aloia [id number]  VITALS: BP 118/67 (BP Location: Left Arm)   Pulse 79   Temp 98.3 F (36.8 C) (Oral)   Resp 20   Wt 226 lb (102.5 kg)   LMP 03/15/2018   SpO2 96%   BMI 38.79 kg/m   LABS:  Lab Results  Component Value Date   WBC 12.1 (H) 04/04/2018   HGB 8.9 (L) 04/04/2018   HCT 29.2 (L) 04/04/2018   MCV 78.5 04/04/2018   PLT 404 (H) 04/04/2018   BMET    Component Value Date/Time   NA 138 04/04/2018 0640   K 4.2 04/04/2018 0640   CL 104 04/04/2018 0640   CO2 26 04/04/2018 0640   GLUCOSE 131 (H) 04/04/2018 0640   BUN 23 (H) 04/04/2018 0640   CREATININE 1.05 (H) 04/04/2018 0640   CALCIUM 8.7 (L) 04/04/2018 0640   GFRNONAA >60 04/04/2018 0640   GFRAA >60 04/04/2018 0640    No results found for: INR, PROTIME No results found for: PTT   Theresa Crawford Brubeck is status post extraction of tooth #30 with alveoloplasty and gross debridement of remaining teeth in the OR with GA.  SUBJECTIVE: Patient has minimal complaints from dental extraction site.  EXAM: No sign of infection, heme, or ooze. Sutures intact.   ASSESSMENT: Post operative course is consistent with dental procedures performed in the OR. Loss of tooth #30 due to extraction  PLAN: 1. Use salt water rinses every two hours as needed to aid healing. 2. Clindamycin 300 mg po Q 8 hours for 5 days.  3. F/U with Dental medicine for suture removal in 7-10 days. 4. Patient ok for discharge from dental standpoint.   Charlynne Panderonald F. Kulinski, DDS

## 2018-04-04 NOTE — Social Work (Addendum)
[facility].  Per psychiatric evaluation by Dr. Juanetta BeetsJacqueline Norman pt warrants inpatient psychiatric support. Pt states she will be voluntary admit.   [facility] or Heriberto AntiguaJean Sutter, CSW to transfer pt.  Attending MD aware.   12:50pm- Ref[facility] still pending [facility], [facility] also given referral. Both do not have beds currently they will follow up with this writer.   4:45pm- Still no beds available, will continue search for inpatient psychiatric bed. MD paged, pt informed, requested that I call her friend Darcel BayleyLeonard. Darcel BayleyLeonard called and updated, he is understanding of plan.  Doy HutchingIsabel H Pio Eatherly, LCSWA [facility] Clinical Social Work [phone]

## 2018-04-04 NOTE — Clinical Social Work Note (Signed)
Clinical Social Work Assessment  Patient Details  Name: Theresa Crawford MRN: [id number] Date of Birth: [birth year]  Date of referral:  04/04/18               Reason for consult:  Suicide Risk/Attempt, Mental Health Concerns                Permission sought to share information with:  Customer service manager, Psychiatrist Permission granted to share information::  Yes, Verbal Permission Granted  Name::     Buford Dresser, MD  Agency::  [facility]   Housing/Transportation Living arrangements for the past 2 months:  [place] of Information:  Patient Patient Interpreter Needed:  None Criminal Activity/Legal Involvement Pertinent to Current Situation/Hospitalization:  No - Comment as needed Significant Relationships:  Dependent Crawford, Other Family Members, Friend Lives with:  Theresa Children Do you feel safe going back to the place where you live?  Yes Need for family participation in patient care:  No (Coment)  Care giving concerns: Pt came in for surgery, per notes made comments involving self harm/suicidality. Dr. Mariea Clonts with psychiatry saw pt and states that she warrants inpatient psychiatric support and bedside sitter.    Social Worker assessment / plan:  CSW met with pt at bedside. Pt sitting in dark room, bedside sitter present during assessment. CSW introduced self, and role in relation to Dr. Lorette Ang recommendations. Pt states she remembers meeting Dr. Mariea Clonts and their conversation. Pt states that she lives at home with a 68 year old Theresa Crawford who is under the care of a friend currently. CSW explained recommendation for inpatient psychiatric support at a behavioral health facility. When asked why this was recommended CSW explained that per the psychiatrists notes pt would benefit from additional support at a behavioral health facility to support with mental and emotional wellbeing as well as medication management of current mental health  medications. Pt states understanding, did get tearful, gestured to the bedside sitter which CSW explained was also a recommendation for pt safety by psychiatric team.   Pt understands the difference between voluntary and involuntary commitments and what they mean for length of stay at [facility] CSW explained that referral would be made to [facility] and [facility]. Pt would like updates when available regarding placement to update the current person taking care of her Theresa Crawford.   Employment status:  Unemployed Forensic scientist:  Medicaid In [place] PT Recommendations:  Not assessed at this time Information / Referral to community resources:  Inpatient Psychiatric Care (Comment Required)([facility])  Patient/Family's Response to care:  Pt had questions regarding [facility] placement processes and why it was being recommended. All questions answered to the best of this writers ability. Pt states she will sign voluntary form when bed available.   Patient/Family's Understanding of and Emotional Response to Diagnosis, Current Treatment, and Prognosis:  Pt states understanding after having questions answered of diagnosis, current treatment and prognosis. Pt states she feels okay after having tooth extracted and had good questions regarding why the sitter was in place as well as [facility] process. Pt appeared tearful and overwhelmed but was pleasant and amenable to meeting with CSW.   Emotional Assessment Appearance:  Appears stated age Attitude/Demeanor/Rapport:  Crying, Gracious Affect (typically observed):  Quiet, Guarded, Tearful/Crying, Pleasant Orientation:  Oriented to Self, Oriented to Place, Oriented to  Time, Oriented to Situation Alcohol / Substance use:  Tobacco Use Psych involvement (Current and /or in the community):  No (Comment)  Discharge Needs  Concerns to be addressed:  Mental Health Concerns Readmission within the last 30 days:  Yes Current discharge risk:  Chronically ill, Psychiatric  Illness Barriers to Discharge:  Requiring sitter/restraints, Psych Bed not available   Alexander Mt, LCSWA 04/04/2018, 9:57 AM

## 2018-04-05 ENCOUNTER — Other Ambulatory Visit: Payer: Self-pay

## 2018-04-05 ENCOUNTER — Inpatient Hospital Stay (HOSPITAL_COMMUNITY)
Admission: AD | Admit: 2018-04-05 | Discharge: 2018-04-15 | DRG: 885 | Disposition: A | Discharge status: Home / Self Care | Payer: Medicaid Other | Source: Intra-hospital | Attending: Psychiatry | Admitting: Psychiatry

## 2018-04-05 ENCOUNTER — Encounter (HOSPITAL_COMMUNITY): Payer: Self-pay | Admitting: *Deleted

## 2018-04-05 DIAGNOSIS — F410 Panic disorder [episodic paroxysmal anxiety] without agoraphobia: Secondary | ICD-10-CM | POA: Diagnosis not present

## 2018-04-05 DIAGNOSIS — Z915 Personal history of self-harm: Secondary | ICD-10-CM

## 2018-04-05 DIAGNOSIS — F419 Anxiety disorder, unspecified: Secondary | ICD-10-CM | POA: Diagnosis not present

## 2018-04-05 DIAGNOSIS — F17210 Nicotine dependence, cigarettes, uncomplicated: Secondary | ICD-10-CM | POA: Diagnosis not present

## 2018-04-05 DIAGNOSIS — I5032 Chronic diastolic (congestive) heart failure: Secondary | ICD-10-CM | POA: Diagnosis present

## 2018-04-05 DIAGNOSIS — Z87891 Personal history of nicotine dependence: Secondary | ICD-10-CM | POA: Diagnosis not present

## 2018-04-05 DIAGNOSIS — I052 Rheumatic mitral stenosis with insufficiency: Secondary | ICD-10-CM | POA: Diagnosis present

## 2018-04-05 DIAGNOSIS — Z813 Family history of other psychoactive substance abuse and dependence: Secondary | ICD-10-CM | POA: Diagnosis not present

## 2018-04-05 DIAGNOSIS — Z91041 Radiographic dye allergy status: Secondary | ICD-10-CM

## 2018-04-05 DIAGNOSIS — D649 Anemia, unspecified: Secondary | ICD-10-CM | POA: Diagnosis present

## 2018-04-05 DIAGNOSIS — F329 Major depressive disorder, single episode, unspecified: Secondary | ICD-10-CM

## 2018-04-05 DIAGNOSIS — R45851 Suicidal ideations: Secondary | ICD-10-CM | POA: Diagnosis present

## 2018-04-05 DIAGNOSIS — J45909 Unspecified asthma, uncomplicated: Secondary | ICD-10-CM | POA: Diagnosis present

## 2018-04-05 DIAGNOSIS — Z91030 Bee allergy status: Secondary | ICD-10-CM

## 2018-04-05 DIAGNOSIS — Z811 Family history of alcohol abuse and dependence: Secondary | ICD-10-CM | POA: Diagnosis not present

## 2018-04-05 DIAGNOSIS — Z818 Family history of other mental and behavioral disorders: Secondary | ICD-10-CM

## 2018-04-05 DIAGNOSIS — Z91013 Allergy to seafood: Secondary | ICD-10-CM | POA: Diagnosis not present

## 2018-04-05 DIAGNOSIS — F332 Major depressive disorder, recurrent severe without psychotic features: Secondary | ICD-10-CM | POA: Diagnosis not present

## 2018-04-05 DIAGNOSIS — Z98818 Other dental procedure status: Secondary | ICD-10-CM

## 2018-04-05 DIAGNOSIS — I110 Hypertensive heart disease with heart failure: Secondary | ICD-10-CM | POA: Diagnosis present

## 2018-04-05 DIAGNOSIS — Z62811 Personal history of psychological abuse in childhood: Secondary | ICD-10-CM | POA: Diagnosis not present

## 2018-04-05 DIAGNOSIS — F333 Major depressive disorder, recurrent, severe with psychotic symptoms: Secondary | ICD-10-CM | POA: Diagnosis present

## 2018-04-05 DIAGNOSIS — Z62810 Personal history of physical and sexual abuse in childhood: Secondary | ICD-10-CM | POA: Diagnosis not present

## 2018-04-05 DIAGNOSIS — Z8249 Family history of ischemic heart disease and other diseases of the circulatory system: Secondary | ICD-10-CM

## 2018-04-05 DIAGNOSIS — Z880 Allergy status to penicillin: Secondary | ICD-10-CM

## 2018-04-05 DIAGNOSIS — F411 Generalized anxiety disorder: Secondary | ICD-10-CM | POA: Diagnosis not present

## 2018-04-05 DIAGNOSIS — G4700 Insomnia, unspecified: Secondary | ICD-10-CM | POA: Diagnosis present

## 2018-04-05 DIAGNOSIS — F339 Major depressive disorder, recurrent, unspecified: Secondary | ICD-10-CM | POA: Diagnosis present

## 2018-04-05 MED ORDER — RISAQUAD PO CAPS
1.0000 | ORAL_CAPSULE | Freq: Every day | ORAL | Status: DC
  Administered 2018-04-06 – 2018-04-15 (×10): 1 via ORAL
  Filled 2018-04-05 (×12): qty 1

## 2018-04-05 MED ORDER — QUETIAPINE FUMARATE 25 MG PO TABS
25.0000 mg | ORAL_TABLET | Freq: Every day | ORAL | Status: DC
  Administered 2018-04-05 – 2018-04-06 (×2): 25 mg via ORAL
  Filled 2018-04-05 (×5): qty 1

## 2018-04-05 MED ORDER — HYDROCODONE-ACETAMINOPHEN 5-325 MG PO TABS
1.0000 | ORAL_TABLET | Freq: Four times a day (QID) | ORAL | Status: DC | PRN
  Administered 2018-04-05: 1 via ORAL
  Administered 2018-04-06: 2 via ORAL
  Administered 2018-04-06: 1 via ORAL
  Administered 2018-04-06 – 2018-04-07 (×2): 2 via ORAL
  Filled 2018-04-05: qty 1
  Filled 2018-04-05 (×3): qty 2
  Filled 2018-04-05: qty 1

## 2018-04-05 MED ORDER — CLINDAMYCIN HCL 300 MG PO CAPS
300.0000 mg | ORAL_CAPSULE | Freq: Three times a day (TID) | ORAL | Status: AC
  Administered 2018-04-05 – 2018-04-08 (×10): 300 mg via ORAL
  Filled 2018-04-05: qty 1
  Filled 2018-04-05: qty 2
  Filled 2018-04-05 (×6): qty 1
  Filled 2018-04-05: qty 4
  Filled 2018-04-05 (×3): qty 1

## 2018-04-05 MED ORDER — DOCUSATE SODIUM 100 MG PO CAPS: 100.0000 mg | ORAL_CAPSULE | Freq: Every day | ORAL | Status: DC | PRN

## 2018-04-05 MED ORDER — SENNOSIDES-DOCUSATE SODIUM 8.6-50 MG PO TABS: 1.0000 | ORAL_TABLET | Freq: Every evening | ORAL | Status: DC | PRN

## 2018-04-05 MED ORDER — LORAZEPAM 1 MG PO TABS
1.0000 mg | ORAL_TABLET | Freq: Once | ORAL | Status: AC
  Administered 2018-04-05: 1 mg via ORAL
  Filled 2018-04-05: qty 1

## 2018-04-05 MED ORDER — CITALOPRAM HYDROBROMIDE 40 MG PO TABS
40.0000 mg | ORAL_TABLET | Freq: Two times a day (BID) | ORAL | Status: DC
  Administered 2018-04-05 – 2018-04-06 (×2): 40 mg via ORAL
  Filled 2018-04-05 (×3): qty 1
  Filled 2018-04-05: qty 2
  Filled 2018-04-05 (×3): qty 1

## 2018-04-05 MED ORDER — FERROUS SULFATE 325 (65 FE) MG PO TABS
325.0000 mg | ORAL_TABLET | Freq: Every day | ORAL | Status: DC
  Administered 2018-04-06 – 2018-04-15 (×10): 325 mg via ORAL
  Filled 2018-04-05 (×12): qty 1

## 2018-04-05 MED ORDER — TORSEMIDE 20 MG PO TABS
20.0000 mg | ORAL_TABLET | Freq: Two times a day (BID) | ORAL | Status: DC
  Administered 2018-04-05 – 2018-04-15 (×20): 20 mg via ORAL
  Filled 2018-04-05 (×25): qty 1

## 2018-04-05 MED ORDER — NICOTINE 14 MG/24HR TD PT24
14.0000 mg | MEDICATED_PATCH | Freq: Every day | TRANSDERMAL | Status: DC
  Administered 2018-04-05 – 2018-04-15 (×11): 14 mg via TRANSDERMAL
  Filled 2018-04-05 (×14): qty 1

## 2018-04-05 MED ORDER — BISACODYL 10 MG RE SUPP: 10.0000 mg | Freq: Every day | RECTAL | Status: DC | PRN

## 2018-04-05 MED ORDER — ALBUTEROL SULFATE (2.5 MG/3ML) 0.083% IN NEBU: 2.5000 mg | INHALATION_SOLUTION | Freq: Four times a day (QID) | RESPIRATORY_TRACT | Status: DC | PRN

## 2018-04-05 NOTE — Social Work (Addendum)
Clinical Social Worker facilitated patient discharge including contacting patient friend Darcel BayleyLeonard at  [phone], and facility to confirm patient discharge plans.  Clinical information faxed to facility and family agreeable with plan.  CSW [facility] [place] @4 :40pm. RN to call [phone] with report prior to discharge. Accepting MD: Dr. Jama Flavorsobos Referring MD: Dr. Marlowe ShoresNorman/Dr. Mikhail Room [phone]  Clinical Social Worker will sign off for now as social work intervention is no longer needed. Please consult us again if new need arises.  Doy HutchingIsabel H Cowan Pilar, [place] Clinical Social Worker [phone]

## 2018-04-05 NOTE — Progress Notes (Signed)
Discharge summary done on 04/04/2018 by Dr. Erin HearingMauricio Arrien. No changes made to medications or summary. Patient seen and examined, currently stable for discharge.  Theresa Crawford D.O. [facility] Pager [phone]  If 7PM-7AM, please contact night-coverage [email or web address] Password [email or web address] 04/05/2018, 12:49 PM

## 2018-04-05 NOTE — Tx Team (Signed)
Initial Treatment Plan 04/05/2018 6:47 PM Theresa Crawford [id number]    PATIENT STRESSORS: Health problems Marital or family conflict   PATIENT STRENGTHS: Ability for insight Average or above average intelligence Capable of independent living General fund of knowledge   PATIENT IDENTIFIED PROBLEMS: Depression Suicidal thoughts "I have been depressed my whole life"                     DISCHARGE CRITERIA:  Ability to meet basic life and health needs Improved stabilization in mood, thinking, and/or behavior Reduction of life-threatening or endangering symptoms to within safe limits Verbal commitment to aftercare and medication compliance  PRELIMINARY DISCHARGE PLAN: Attend aftercare/continuing care group Return to previous living arrangement  PATIENT/FAMILY INVOLVEMENT: This treatment plan has been presented to and reviewed with the patient, Theresa Crawford, and/or family member, .  The patient and family have been given the opportunity to ask questions and make suggestions.  Derya Dettmann, [place], [place] 04/05/2018, 6:47 PM

## 2018-04-05 NOTE — Progress Notes (Signed)
Transport here to pick pt up to transport to behavioral health. Copy of discharge instructions from here sent with transporter, informed pt that [facility] that she will receive a copy of instructions then according to her needs at that time. Belongings sent with transporter.

## 2018-04-05 NOTE — Social Work (Signed)
[facility] for [facility] this afternoon.  Spoke with pt and updated her this morning with all information. Pt signed voluntary commitment form after reading it thoroughly.   All questions answered, MD paged for updated d/c summary. Await information regarding bed opening and will d/c when that information is received. Voluntary [facility] at [phone].  Doy HutchingIsabel H Amiya Escamilla, LCSWA [facility] Clinical Social Work [phone]

## 2018-04-05 NOTE — Progress Notes (Signed)
Nursing Progress Note: 7p-7a D: Pt currently presents with a anxious/pleasant affect and behavior. Pt states "I miss my son and that is my main reason why I am here." Interacting appropriately with the milieu. Pt reports good sleep during the previous night with current medication regimen. Pt did attend wrap-up group.  A: Pt provided with medications per providers orders. Pt's labs and vitals were monitored throughout the night. Pt supported emotionally and encouraged to express concerns and questions. Pt educated on medications.  R: Pt's safety ensured with 15 minute and environmental checks. Pt currently denies SI, HI, and AVH. Pt verbally contracts to seek staff if SI,HI, or AVH occurs and to consult with staff before acting on any harmful thoughts. Will continue to monitor.
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
## 2018-04-05 NOTE — Progress Notes (Signed)
EKG completed and put on MD desk for review. 

## 2018-04-05 NOTE — Progress Notes (Signed)
Adult Psychoeducational Group Note  Date:  04/05/2018 Time:  9:45 PM  Group Topic/Focus:  Wrap-Up Group:   The focus of this group is to help patients review their daily goal of treatment and discuss progress on daily workbooks.  Participation Level:  Active  Participation Quality:  Appropriate  Affect:  Appropriate  Cognitive:  Alert  Insight: Appropriate  Engagement in Group:  Engaged  Modes of Intervention:  Discussion  Additional Comments:  Patient rated her day a 2 being that it's her 1st day here. Patient's goal for today us to stabilize her thoughts.  Abdul Beirne L Christop Hippert 04/05/2018, 9:45 PM

## 2018-04-06 DIAGNOSIS — F410 Panic disorder [episodic paroxysmal anxiety] without agoraphobia: Secondary | ICD-10-CM

## 2018-04-06 DIAGNOSIS — Z62811 Personal history of psychological abuse in childhood: Secondary | ICD-10-CM

## 2018-04-06 DIAGNOSIS — Z62810 Personal history of physical and sexual abuse in childhood: Secondary | ICD-10-CM

## 2018-04-06 DIAGNOSIS — Z818 Family history of other mental and behavioral disorders: Secondary | ICD-10-CM

## 2018-04-06 DIAGNOSIS — Z813 Family history of other psychoactive substance abuse and dependence: Secondary | ICD-10-CM

## 2018-04-06 DIAGNOSIS — F419 Anxiety disorder, unspecified: Secondary | ICD-10-CM

## 2018-04-06 DIAGNOSIS — F333 Major depressive disorder, recurrent, severe with psychotic symptoms: Secondary | ICD-10-CM

## 2018-04-06 MED ORDER — LORAZEPAM 0.5 MG PO TABS
0.5000 mg | ORAL_TABLET | Freq: Four times a day (QID) | ORAL | Status: DC | PRN
  Administered 2018-04-06 – 2018-04-15 (×14): 0.5 mg via ORAL
  Filled 2018-04-06 (×15): qty 1

## 2018-04-06 MED ORDER — SERTRALINE HCL 25 MG PO TABS
25.0000 mg | ORAL_TABLET | Freq: Every day | ORAL | Status: DC
  Administered 2018-04-06 – 2018-04-07 (×2): 25 mg via ORAL
  Filled 2018-04-06 (×5): qty 1

## 2018-04-06 NOTE — Progress Notes (Signed)
D: Patient is focused on her tooth pain.  She is getting 1-2 vicodin every 6 hours due to a recent extraction.  She rates her depression and hopelessness as an 8; anxiety as a 9.  She denies any thoughts of self harm.  She is smiling and talkative with staff.  She talked to her pastor and is looking forward to a visit.  Her goal today is to "clear my mind from negativity."  A: Continue to monitor medication management and MD orders.  Safety checks completed every 15 minutes per protocol.  Offer support and encouragement as needed.  R: Patient is receptive to staff; her behavior is appropriate

## 2018-04-06 NOTE — [facility] Suicide Risk Assessment (Signed)
[facility] INPATIENT:  Family/Significant Other Suicide Prevention Education  Suicide Prevention Education:  Education Completed; Annamarie DawleyStephanie Daniel, mother, [phone]  has been identified by the patient as the family member/significant other with whom the patient will be residing, and identified as the person(s) who will aid the patient in the event of a mental health crisis (suicidal ideations/suicide attempt).  With written consent from the patient, the family member/significant other has been provided the following suicide prevention education, prior to the and/or following the discharge of the patient.  The suicide prevention education provided includes the following:  Suicide risk factors  Suicide prevention and interventions  National Suicide Hotline telephone number  [facility] assessment telephone number  [facility] Emergency Assistance 911  [facility] and/or Residential Mobile Crisis Unit telephone number  Request made of family/significant other to:  Remove weapons (e.g., guns, rifles, knives), all items previously/currently identified as safety concern.    Remove drugs/medications (over-the-counter, prescriptions, illicit drugs), all items previously/currently identified as a safety concern.  The family member/significant other verbalizes understanding of the suicide prevention education information provided.  The family member/significant other agrees to remove the items of safety concern listed above.  Baldo DaubRodney B [facility] 04/06/2018, 12:07 PM
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
## 2018-04-06 NOTE — [facility] Group Notes (Signed)
[facility] LCSW Group Therapy Note  Date/Time: 04/06/18, 1315  Type of Therapy/Topic:  Group Therapy:  Balance in Life  Participation Level:  moderate  Description of Group:    This group will address the concept of balance and how it feels and looks when one is unbalanced. Patients will be encouraged to process areas in their lives that are out of balance, and identify reasons for remaining unbalanced. Facilitators will guide patients utilizing problem- solving interventions to address and correct the stressor making their life unbalanced. Understanding and applying boundaries will be explored and addressed for obtaining  and maintaining a balanced life. Patients will be encouraged to explore ways to assertively make their unbalanced needs known to significant others in their lives, using other group members and facilitator for support and feedback.  Therapeutic Goals: 1. Patient will identify two or more emotions or situations they have that consume much of in their lives. 2. Patient will identify signs/triggers that life has become out of balance:  3. Patient will identify two ways to set boundaries in order to achieve balance in their lives:  4. Patient will demonstrate ability to communicate their needs through discussion and/or role plays  Summary of Patient Progress:Pt shared that family and finances are out of balance in his life currently.  Pt mostly quiet during group discussion regarding how to try to set boundaries and move these areas back into better balance, but did respond to CSW questions.           Therapeutic Modalities:   Cognitive Behavioral Therapy Solution-Focused Therapy Assertiveness Training  Daleen SquibbGreg Nico Syme, [place]
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
## 2018-04-06 NOTE — Progress Notes (Signed)
Pt attended goals and orientation group this morning. Pt participated and was active.   

## 2018-04-06 NOTE — Progress Notes (Signed)
Nursing Progress Note: 7p-7a D: Pt currently presents with a anxious/pleasant affect and behavior. Pt states "My teeth are really hurting me." Interacting appropriately with the milieu. Pt reports fairsleep during the previous night with current medication regimen. Pt did attend wrap-up group.  A: Pt provided with medications per providers orders. Pt's labs and vitals were monitored throughout the night. Pt supported emotionally and encouraged to express concerns and questions. Pt educated on medications.  R: Pt's safety ensured with 15 minute and environmental checks. Pt currently denies SI, HI, and AVH. Pt verbally contracts to seek staff if SI,HI, or AVH occurs and to consult with staff before acting on any harmful thoughts. Will continue to monitor.

## 2018-04-06 NOTE — Progress Notes (Signed)
Adult Psychoeducational Group Note  Date:  04/06/2018 Time:  9:28 PM  Group Topic/Focus:  Wrap-Up Group:   The focus of this group is to help patients review their daily goal of treatment and discuss progress on daily workbooks.  Participation Level:  Active  Participation Quality:  Appropriate  Affect:  Appropriate  Cognitive:  Alert  Insight: Appropriate  Engagement in Group:  Engaged  Modes of Intervention:  Discussion  Additional Comments:  Patient stated having a good day. Patient's goal(s) for today was to see pastor, talk to the doctor, and get  Medications changed. Patient met all of her goals.  Jesicca Dipierro L Aubriella Perezgarcia 04/06/2018, 9:28 PM

## 2018-04-07 ENCOUNTER — Ambulatory Visit: Payer: Medicaid Other | Admitting: Student

## 2018-04-07 MED ORDER — QUETIAPINE FUMARATE 50 MG PO TABS
50.0000 mg | ORAL_TABLET | Freq: Every day | ORAL | Status: DC
  Filled 2018-04-07: qty 1

## 2018-04-07 MED ORDER — HYDROCODONE-ACETAMINOPHEN 5-325 MG PO TABS
1.0000 | ORAL_TABLET | Freq: Three times a day (TID) | ORAL | Status: DC | PRN
  Administered 2018-04-07: 1 via ORAL
  Filled 2018-04-07: qty 1

## 2018-04-07 MED ORDER — QUETIAPINE FUMARATE 25 MG PO TABS
25.0000 mg | ORAL_TABLET | Freq: Every day | ORAL | Status: DC
  Administered 2018-04-07: 25 mg via ORAL
  Filled 2018-04-07 (×3): qty 1

## 2018-04-07 MED ORDER — SERTRALINE HCL 50 MG PO TABS
50.0000 mg | ORAL_TABLET | Freq: Every day | ORAL | Status: DC
  Administered 2018-04-08 – 2018-04-11 (×4): 50 mg via ORAL
  Filled 2018-04-07 (×5): qty 1

## 2018-04-07 NOTE — Progress Notes (Signed)
Recreation Therapy Notes  Date: 7.26.19 Time: 0930 Location: [address] Dayroom  Group Topic: Stress Management  Goal Area(s) Addresses:  Patient will verbalize importance of using healthy stress management.  Patient will identify positive emotions associated with healthy stress management.   Intervention: Stress Management  Activity : Progressive Muscle Relaxation.  LRT introduced the stress management technique of progressive muscle relaxation.  LRT read a script that guided patients through a series of tensing muscles and then relaxing.  Patients were to follow along as LRT read script to engage in the activity.  Education:  Stress Management, Discharge Planning.   Education Outcome: Acknowledges edcuation/In group clarification offered/Needs additional education  Clinical Observations/Feedback: Pt did not attend group.    Buck Mcaffee, LRT/CTRS         Deniz Eskridge A 04/07/2018 11:10 AM 

## 2018-04-07 NOTE — Progress Notes (Signed)
D: Patient has brighter affect today; her mood is stable.  She is preoccupied with her tooth extraction.  She continues to ask for her vicodin which has been decreased.  Patient wanted to staff to look at her tooth and it appears that a stitch is visible.  She denies any thoughts of self harm.  Patient rates her depression and anxiety as a 7; hopelessness as a 4.  Her goal today is to "converse more and adjust to new meds."  A: Continue to monitor medication management and MD orders.  Safety checks completed every 15 minutes per protocol.  Offer support and encouragement as needed.  R: Patient is receptive to staff; her behavior is appropriate.

## 2018-04-07 NOTE — Progress Notes (Signed)
[facility] MD Progress Note  04/07/2018 3:48 PM Theresa Crawford  MRN:  [id number] Subjective: Patient describes feeling better than prior to admission, but endorses persistent depression and anxiety, tends to worry about her physical health. Describes mouth pain associated with recent tooth extraction and debridement.  Denies suicidal ideations. Denies medication side effects. Objective: I have discussed case with treatment team and have met with patient. 37 year old female, history of cardiac illness (mitral valve stenosis), reports she is scheduled for upcoming elective surgery for mitral valve replacement, date is not yet set.  As part of preparation for this procedure underwent a tooth extraction and debridement.  During  this treatment acknowledged depression, suicidal ideations. At this time presents alert, attentive, mood presents partially improved, smiles at times appropriately, denies suicidal ideations at this time, contracts for safety.  Does continue to worry and ruminate about her physical health, partially responsive to reassurance and support. She has been visible on unit, presents pleasant on approach, some group participation. Denies suicidal ideations  Principal Problem: Depression/anxiety  Diagnosis:   Patient Active Problem List   Diagnosis Date Noted  . MDD (major depressive disorder), recurrent episode ([place]) [F33.9] 04/05/2018  . S/P tooth extraction [K08.409] 04/03/2018  . Suicidal ideation [R45.851] 04/03/2018  . MDD (major depressive disorder) [F32.9]   . Chronic diastolic congestive heart failure ([place]) [I50.32]   . Tricuspid regurgitation [I07.1]   . Hypomagnesemia [E83.42] 03/07/2018  . Tobacco abuse [Z72.0]   . Dyspnea [R06.00] 01/11/2018  . Prolonged QT interval [R94.31] 09/10/2017  . Normochromic normocytic anemia [D64.9]   . Pulmonary edema [J81.1] 07/05/2017  . Hypokalemia [E87.6] 06/12/2017  . Pulmonary hypertension ([place]) [I27.20] 06/12/2017  . Flash pulmonary  edema ([place]) [J81.0] 06/11/2017  . Depression with anxiety [F41.8] 06/11/2017  . Recurrent mitral valve stenosis and regurgitation s/p mitral valve repair [I05.2]   . Acute pulmonary edema ([place]) [J81.0] 10/10/2016  . CAP (community acquired pneumonia) [J18.9] 05/25/2016  . Leukocytosis [D72.829] 05/24/2016  . Acute respiratory failure with hypoxia ([place]) [J96.01] 05/23/2016  . Acute on chronic diastolic CHF (congestive heart failure) ([place]) [I50.33] 05/23/2016  . Cough with hemoptysis [R04.2] 05/23/2016  . Postpartum complication pericarditis in 2009 with eventual needing MVR in 2015 [O90.89] 05/23/2016  . Anemia, iron deficiency [D50.9] 05/23/2016  . Hypertension [I10]   . SOB (shortness of breath) [R06.02]   . Respiratory distress [R06.03] 02/16/2016  . Essential hypertension [I10] 02/16/2016  . S/P mitral valve repair [Z98.890] 03/27/2013   Total Time spent with patient: 20 minutes   Past Psychiatric History:   Past Medical History:  Past Medical History:  Diagnosis Date  . Anxiety   . Arthritis    "lower back" (04/04/2018)  . Chronic bronchitis ([place])   . Chronic diastolic CHF (congestive heart failure) ([place])   . Chronic lower back pain   . DDD (degenerative disc disease), lumbar   . Depression   . GERD (gastroesophageal reflux disease)   . Headache    "weekly" (04/04/2018)  . Heart murmur   . Hypertension   . Mitral valve disease   . Normocytic anemia   . Obesity   . Palpitations   . Pericarditis   . Premature atrial contractions   . Pulmonary hypertension ([place])   . PVC's (premature ventricular contractions)    a. h/o palpitations with event monitor in 03/2017 showing NSR with PACs/PVCs.  . Recurrent mitral valve stenosis and regurgitation s/p mitral valve repair   . S/P mitral valve repair 03/27/2013   Dr. Elta Guadeloupe  [place], [place] - complex valvuloplasty including resuspension of entire posterior leaflet using Gore-tex neochords and 30 mm Edwards Physio ring  annuloplasty  . Sarcoidosis   . Tobacco abuse   . Tricuspid regurgitation     Past Surgical History:  Procedure Laterality Date  . ABDOMINAL HERNIA REPAIR  ~ 2011  . ABDOMINAL HYSTERECTOMY  2011   "partial"; PID w/problem with fallopian tubes, had left fallopian and left ovary removed, pt still having periods  . CARDIAC CATHETERIZATION  2014  . CESAREAN SECTION  2004; 2009  . HERNIA REPAIR    . MITRAL VALVE REPAIR  03/27/2013   Dr. Fransico Michael - [place], [place]. - complex valvuloplasty including resuspension of posterior leaflet with 30 mm CE Physio ring annuloplasty  . MULTIPLE EXTRACTIONS WITH ALVEOLOPLASTY N/A 04/03/2018   Procedure: Extraction of tooth #30 with alveoloplasty and gross debridement of remaining teeth;  Surgeon: Lenn Cal, DDS;  Location: [place];  Service: Oral Surgery;  Laterality: N/A;  . PARTIAL HYSTERECTOMY  2011   PID w/problem with fallopian tubes, had left fallopian and left ovary removed, pt still having periods  . TEE WITHOUT CARDIOVERSION N/A 05/26/2016   Procedure: TRANSESOPHAGEAL ECHOCARDIOGRAM (TEE);  Surgeon: Herminio Commons, MD;  [facility];  Service: Cardiovascular;  Laterality: N/A;  . TEE WITHOUT CARDIOVERSION N/A 01/25/2018   Procedure: TRANSESOPHAGEAL ECHOCARDIOGRAM (TEE) WITH PROPOFOL;  Surgeon: Satira Sark, MD;  [facility];  Service: Cardiovascular;  Laterality: N/A;   Family History:  Family History  Problem Relation Age of Onset  . Heart failure Father        just received LVAD  . Heart disease Paternal Grandfather        stent placement   Family Psychiatric  History:  Social History:  Social History   Substance and Sexual Activity  Alcohol Use Yes   Comment: 04/04/2018 "1-2 beers/month; if that"     Social History   Substance and Sexual Activity  Drug Use Not Currently    Social History   Socioeconomic History  . Marital status: Single    Spouse name: Not on file  . Number of  children: 2  . Years of education: Not on file  . Highest education level: Not on file  Occupational History  . Not on file  Social Needs  . Financial resource strain: Not on file  . Food insecurity:    Worry: Not on file    Inability: Not on file  . Transportation needs:    Medical: Not on file    Non-medical: Not on file  Tobacco Use  . Smoking status: Current Some Day Smoker    Packs/day: 0.50    Years: 24.00    Pack years: 12.00    Types: Cigarettes    Start date: 11/03/1999    Last attempt to quit: 03/20/2018    Years since quitting: 0.0  . Smokeless tobacco: Never Used  Substance and Sexual Activity  . Alcohol use: Yes    Comment: 04/04/2018 "1-2 beers/month; if that"  . Drug use: Not Currently  . Sexual activity: Not Currently  Lifestyle  . Physical activity:    Days per week: Not on file    Minutes per session: Not on file  . Stress: Not on file  Relationships  . Social connections:    Talks on phone: Not on file    Gets together: Not on file    Attends religious service: Not on file  Active member of club or organization: Not on file    Attends meetings of clubs or organizations: Not on file    Relationship status: Not on file  Other Topics Concern  . Not on file  Social History Narrative  . Not on file   Additional Social History:   Sleep: Good  Appetite:  Fair  Current Medications: Current Facility-Administered Medications  Medication Dose Route Frequency Provider Last Rate Last Dose  . acidophilus (RISAQUAD) capsule 1 capsule  1 capsule Oral Daily Rankin, Shuvon B, NP   1 capsule at 04/07/18 0753  . albuterol (PROVENTIL) (2.5 MG/3ML) 0.083% nebulizer solution 2.5 mg  2.5 mg Nebulization Q6H PRN Rankin, Shuvon B, NP      . bisacodyl (DULCOLAX) suppository 10 mg  10 mg Rectal Daily PRN Rankin, Shuvon B, NP      . clindamycin (CLEOCIN) capsule 300 mg  300 mg Oral Q8H Rankin, Shuvon B, NP   300 mg at 04/07/18 1410  . docusate sodium (COLACE) capsule  100 mg  100 mg Oral Daily PRN Rankin, Shuvon B, NP      . ferrous sulfate tablet 325 mg  325 mg Oral Q breakfast Rankin, Shuvon B, NP   325 mg at 04/07/18 0753  . HYDROcodone-acetaminophen (NORCO/VICODIN) 5-325 MG per tablet 1-2 tablet  1-2 tablet Oral Q6H PRN Rankin, Shuvon B, NP   2 tablet at 04/07/18 0758  . LORazepam (ATIVAN) tablet 0.5 mg  0.5 mg Oral Q6H PRN Cobos, Fernando A, MD   0.5 mg at 04/06/18 2156  . nicotine (NICODERM CQ - dosed in mg/24 hours) patch 14 mg  14 mg Transdermal Daily Cobos, Myer Peer, MD   14 mg at 04/07/18 0754  . QUEtiapine (SEROQUEL) tablet 25 mg  25 mg Oral QHS Rankin, Shuvon B, NP   25 mg at 04/06/18 2156  . senna-docusate (Senokot-S) tablet 1 tablet  1 tablet Oral QHS PRN Rankin, Shuvon B, NP      . sertraline (ZOLOFT) tablet 25 mg  25 mg Oral Daily Cobos, Myer Peer, MD   25 mg at 04/07/18 0753  . torsemide (DEMADEX) tablet 20 mg  20 mg Oral BID Rankin, Shuvon B, NP   20 mg at 04/07/18 0753    Lab Results: No results found for this or any previous visit (from the past 48 hour(s)).  Blood Alcohol level:  No results found for: [facility]  Metabolic Disorder Labs: No results found for: HGBA1C, MPG No results found for: PROLACTIN No results found for: CHOL, TRIG, HDL, CHOLHDL, VLDL, LDLCALC  Physical Findings: AIMS: Facial and Oral Movements Muscles of Facial Expression: None, normal Lips and Perioral Area: None, normal Jaw: None, normal Tongue: None, normal,Extremity Movements Upper (arms, wrists, hands, fingers): None, normal Lower (legs, knees, ankles, toes): None, normal, Trunk Movements Neck, shoulders, hips: None, normal, Overall Severity Severity of abnormal movements (highest score from questions above): None, normal Incapacitation due to abnormal movements: None, normal Patient's awareness of abnormal movements (rate only patient's report): No Awareness, Dental Status Current problems with teeth and/or dentures?: No Does patient usually wear  dentures?: No  CIWA:    COWS:     Musculoskeletal: Strength & Muscle Tone: within normal limits Gait & Station: normal Patient leans: N/A  Psychiatric Specialty Exam: Physical Exam  ROS no fever, no chills, mouth pain related to recent dental procedure, no chest pain, no shortness of breath, no vomiting  Blood pressure 125/76, pulse 95, temperature 97.9 F (36.6 C), temperature source  Oral, resp. rate 18, height 5' 4"  (1.626 m), weight 100.7 kg (222 lb), last menstrual period 03/15/2018.Body mass index is 38.11 kg/m.  General Appearance: Well Groomed  Eye Contact:  Good  Speech:  Normal Rate  Volume:  Normal  Mood:  Partial improvement, remains vaguely depressed  Affect:  Anxious, mildly constricted, improves during session  Thought Process:  Linear and Descriptions of Associations: Intact  Orientation:  Full (Time, Place, and Person)  Thought Content:  No hallucinations, no delusions expressed, somatic preoccupations  Suicidal Thoughts:  No currently denies suicidal ideations, denies self-injurious ideations, contracts for safety on the unit, denies homicidal or violent ideations  Homicidal Thoughts:  No  Memory:  Recent and remote grossly intact  Judgement:  Fair  Insight:  Fair  Psychomotor Activity:  Normal  Concentration:  Concentration: Good and Attention Span: Good  Recall:  Good  Fund of Knowledge:  Good  Language:  Good  Akathisia:  Negative  Handed:  Right  AIMS (if indicated):     Assets:  Desire for Improvement Resilience  ADL's:  Intact  Cognition:  WNL  Sleep:  Number of Hours: 6.25   Assessment-37 year old female, history of mitral valve stenosis who is scheduled for an upcoming valve replacement surgery.  Recent dental procedure (extraction, debridement), reported worsening depression and suicidal ideations.  Also reports anxiety and vague apprehension about her physical health and upcoming surgery.  Today presents with partial improvement, affect seems full  in range, does remain anxious.  Denies suicidal ideations.  Tolerating Seroquel and Zoloft well thus far.   Treatment Plan Summary: Daily contact with patient to assess and evaluate symptoms and progress in treatment, Medication management, Plan inpatient treatment and medications as below Encourage group and milieu participation to work on coping skills and symptom reduction Continue Ativan 0.5 mg every 6 hours PRN for anxiety or insomnia Continue Seroquel 25 mg nightly for insomnia, mood disorder Increase Zoloft to 50 mg daily for depression, anxiety Decrease Hydrocodone to 1 tablet every 8 hours PRN for pain-will taper off as pain improves  Continue ferrous sulfate for anemia. Treatment team working on disposition planning options. Jenne Campus, MD 04/07/2018, 3:48 PM

## 2018-04-07 NOTE — Tx Team (Signed)
Interdisciplinary Treatment and Diagnostic Plan Update  04/07/2018 Time of Session: 1051 Theresa DaneLatasha Y Crawford MRN: [id number]  Principal Diagnosis: <principal problem not specified>  Secondary Diagnoses: Active Problems:   MDD (major depressive disorder), recurrent episode (HCC)   Current Medications:  Current Facility-Administered Medications  Medication Dose Route Frequency Provider Last Rate Last Dose  . acidophilus (RISAQUAD) capsule 1 capsule  1 capsule Oral Daily Rankin, Shuvon B, NP   1 capsule at 04/07/18 0753  . albuterol (PROVENTIL) (2.5 MG/3ML) 0.083% nebulizer solution 2.5 mg  2.5 mg Nebulization Q6H PRN Rankin, Shuvon B, NP      . bisacodyl (DULCOLAX) suppository 10 mg  10 mg Rectal Daily PRN Rankin, Shuvon B, NP      . clindamycin (CLEOCIN) capsule 300 mg  300 mg Oral Q8H Rankin, Shuvon B, NP   300 mg at 04/07/18 0627  . docusate sodium (COLACE) capsule 100 mg  100 mg Oral Daily PRN Rankin, Shuvon B, NP      . ferrous sulfate tablet 325 mg  325 mg Oral Q breakfast Rankin, Shuvon B, NP   325 mg at 04/07/18 0753  . HYDROcodone-acetaminophen (NORCO/VICODIN) 5-325 MG per tablet 1-2 tablet  1-2 tablet Oral Q6H PRN Rankin, Shuvon B, NP   2 tablet at 04/07/18 0758  . LORazepam (ATIVAN) tablet 0.5 mg  0.5 mg Oral Q6H PRN Cobos, Fernando A, MD   0.5 mg at 04/06/18 2156  . nicotine (NICODERM CQ - dosed in mg/24 hours) patch 14 mg  14 mg Transdermal Daily Cobos, Rockey SituFernando A, MD   14 mg at 04/07/18 0754  . QUEtiapine (SEROQUEL) tablet 25 mg  25 mg Oral QHS Rankin, Shuvon B, NP   25 mg at 04/06/18 2156  . senna-docusate (Senokot-S) tablet 1 tablet  1 tablet Oral QHS PRN Rankin, Shuvon B, NP      . sertraline (ZOLOFT) tablet 25 mg  25 mg Oral Daily Cobos, Rockey SituFernando A, MD   25 mg at 04/07/18 0753  . torsemide (DEMADEX) tablet 20 mg  20 mg Oral BID Rankin, Shuvon B, NP   20 mg at 04/07/18 0753   PTA Medications: Medications Prior to Admission  Medication Sig Dispense Refill Last Dose  .  albuterol (PROVENTIL HFA;VENTOLIN HFA) 108 (90 Base) MCG/ACT inhaler Inhale 2 puffs into the lungs every 6 (six) hours as needed for wheezing or shortness of breath.   Past Week at Unknown time  . citalopram (CELEXA) 40 MG tablet Take 40 mg by mouth 2 (two) times daily.   Past Week at Unknown time  . clindamycin (CLEOCIN) 300 MG capsule Take 1 capsule by mouth every 8 hours for 5 days. 15 capsule 0   . docusate sodium (COLACE) 100 MG capsule Take 100 mg by mouth daily as needed for moderate constipation.    Past Week at Unknown time  . EPINEPHrine (EPIPEN 2-PAK) 0.3 mg/0.3 mL IJ SOAJ injection Inject 0.3 Units into the muscle once.    More than a month at Unknown time  . ferrous sulfate 325 (65 FE) MG tablet Take 1 tablet (325 mg total) by mouth 2 (two) times daily with a meal. (Patient taking differently: Take 325 mg by mouth daily with breakfast. )  3 Past Week at Unknown time  . metoprolol succinate (TOPROL-XL) 25 MG 24 hr tablet TAKE 1 TABLET BY MOUTH DAILY (Patient taking differently: Take 37.5 mg by mouth once daily) 30 tablet 6 04/03/2018 at 0440  . potassium chloride SA (K-DUR,KLOR-CON) 20 MEQ tablet  Take 2 tablets (40 mEq total) by mouth daily. (Patient taking differently: Take 40 mEq by mouth 2 (two) times daily. ) 60 tablet 2 04/02/2018 at Unknown time  . Probiotic Product (PROBIOTIC PO) Take 1 capsule by mouth daily.   Past Week at Unknown time  . QUEtiapine (SEROQUEL) 25 MG tablet Take 25 mg by mouth at bedtime.   Past Week at Unknown time  . torsemide (DEMADEX) 20 MG tablet Take 20 mg by mouth 2 (two) times daily.   04/02/2018 at Unknown time    Patient Stressors: Health problems Marital or family conflict  Patient Strengths: Ability for insight Average or above average intelligence Capable of independent living General fund of knowledge  Treatment Modalities: Medication Management, Group therapy, Case management,  1 to 1 session with clinician, Psychoeducation, Recreational  therapy.   Physician Treatment Plan for Primary Diagnosis: <principal problem not specified> Long Term Goal(s): Improvement in symptoms so as ready for discharge Improvement in symptoms so as ready for discharge   Short Term Goals: Ability to identify changes in lifestyle to reduce recurrence of condition will improve Ability to maintain clinical measurements within normal limits will improve Ability to identify changes in lifestyle to reduce recurrence of condition will improve Ability to verbalize feelings will improve Ability to disclose and discuss suicidal ideas Ability to demonstrate self-control will improve Ability to identify and develop effective coping behaviors will improve  Medication Management: Evaluate patient's response, side effects, and tolerance of medication regimen.  Therapeutic Interventions: 1 to 1 sessions, Unit Group sessions and Medication administration.  Evaluation of Outcomes: Progressing  Physician Treatment Plan for Secondary Diagnosis: Active Problems:   MDD (major depressive disorder), recurrent episode (HCC)  Long Term Goal(s): Improvement in symptoms so as ready for discharge Improvement in symptoms so as ready for discharge   Short Term Goals: Ability to identify changes in lifestyle to reduce recurrence of condition will improve Ability to maintain clinical measurements within normal limits will improve Ability to identify changes in lifestyle to reduce recurrence of condition will improve Ability to verbalize feelings will improve Ability to disclose and discuss suicidal ideas Ability to demonstrate self-control will improve Ability to identify and develop effective coping behaviors will improve     Medication Management: Evaluate patient's response, side effects, and tolerance of medication regimen.  Therapeutic Interventions: 1 to 1 sessions, Unit Group sessions and Medication administration.  Evaluation of Outcomes: Progressing   RN  Treatment Plan for Primary Diagnosis: <principal problem not specified> Long Term Goal(s): Knowledge of disease and therapeutic regimen to maintain health will improve  Short Term Goals: Ability to identify and develop effective coping behaviors will improve and Compliance with prescribed medications will improve  Medication Management: RN will administer medications as ordered by provider, will assess and evaluate patient's response and provide education to patient for prescribed medication. RN will report any adverse and/or side effects to prescribing provider.  Therapeutic Interventions: 1 on 1 counseling sessions, Psychoeducation, Medication administration, Evaluate responses to treatment, Monitor vital signs and CBGs as ordered, Perform/monitor CIWA, COWS, AIMS and Fall Risk screenings as ordered, Perform wound care treatments as ordered.  Evaluation of Outcomes: Progressing   LCSW Treatment Plan for Primary Diagnosis: <principal problem not specified> Long Term Goal(s): Safe transition to appropriate next level of care at discharge, Engage patient in therapeutic group addressing interpersonal concerns.  Short Term Goals: Engage patient in aftercare planning with referrals and resources, Increase social support and Increase skills for wellness and recovery  Therapeutic  Interventions: Assess for all discharge needs, 1 to 1 time with Child psychotherapist, Explore available resources and support systems, Assess for adequacy in community support network, Educate family and significant other(s) on suicide prevention, Complete Psychosocial Assessment, Interpersonal group therapy.  Evaluation of Outcomes: Progressing   Progress in Treatment: Attending groups: Yes. Participating in groups: Yes. Taking medication as prescribed: Yes. Toleration medication: Yes. Family/Significant other contact made: Yes, individual(s) contacted:  mother Patient understands diagnosis: Yes. Discussing patient  identified problems/goals with staff: Yes. Medical problems stabilized or resolved: Yes. Denies suicidal/homicidal ideation: Yes. Issues/concerns per patient self-inventory: No. Other: none  New problem(s) identified: No, Describe:  none  New Short Term/Long Term Goal(s):  Patient Goals:  "Stabilize medication, focus on one thing at a time."  Discharge Plan or Barriers:   Reason for Continuation of Hospitalization: Depression Medication stabilization  Estimated Length of Stay: 2-4 days.   Attendees: Patient:Theresa Crawford Sic 04/07/2018   Physician: Dr. Jama Flavors, MD 04/07/2018   Nursing: Roddie Mc, RN 04/07/2018   RN Care Manager: 04/07/2018   Social Worker: Daleen Squibb, LCSW 04/07/2018   Recreational Therapist:  04/07/2018   Other:  04/07/2018   Other:  04/07/2018   Other: 04/07/2018       Scribe for Treatment Team: Lorri Frederick, LCSW 04/07/2018 12:59 PM

## 2018-04-07 NOTE — [facility] Group Notes (Signed)
  [facility]  Date/Time: 04/07/18, 1315  Type of Therapy/Topic:  Group Therapy:  Emotion Regulation  Participation Level:  Active   Mood:pleasant  Description of Group:    The purpose of this group is to assist patients in learning to regulate negative emotions and experience positive emotions. Patients will be guided to discuss ways in which they have been vulnerable to their negative emotions. These vulnerabilities will be juxtaposed with experiences of positive emotions or situations, and patients challenged to use positive emotions to combat negative ones. Special emphasis will be placed on coping with negative emotions in conflict situations, and patients will process healthy conflict resolution skills.  Therapeutic Goals: 1. Patient will identify two positive emotions or experiences to reflect on in order to balance out negative emotions:  2. Patient will label two or more emotions that they find the most difficult to experience:  3. Patient will be able to demonstrate positive conflict resolution skills through discussion or role plays:   Summary of Patient Progress:Pt identified anxiety and depression as emotions that are difficult for her to experience.  Pt was active during group discussion regarding positive ways to deal with difficult emotions.        Therapeutic Modalities:   Cognitive Behavioral Therapy Feelings Identification Dialectical Behavioral Therapy  Daleen SquibbGreg Verenis [place], LCSW

## 2018-04-07 NOTE — Progress Notes (Deleted)
Cardiology Office Note    Date:  04/07/2018   ID:  Theresa Crawford, DOB 24-Apr-1981, MRN [id number]  PCP:  Jerrell BelfastHouse, [name], [name]  Cardiologist: Prentice DockerSuresh Koneswaran, MD    No chief complaint on file.   History of Present Illness:    Theresa Crawford is a 37 y.o. female with past medical history of mitral valve repair (in 2014), chronic diastolic CHF, HTN, and pulmonary HTN who presents to the office today for hospital follow-up.  She was admitted to [facility] from 6/25 to 03/08/2018 for evaluation of worsening leg cramps which it started earlier that day. She reported breathing has been at baseline and denied any specific dyspnea on exertion, orthopnea, or PND. She was found to be significantly hypokalemic with potassium at 2.5 and magnesium was at 1.6.  BNP was slightly elevated at 333 which was consistent with prior labs.  She had been started on IV Lasix at the time of admission but was switched back to her PO medication regimen of Torsemide 20 mg daily at the time of discharge. Was being followed by CT surgery for moderate to severe mitral stenosis as noted on her recent TEE and was informed to keep her upcoming appointment with Dr. Cornelius Moraswen.   She was evaluated by Dr. Cornelius Moraswen on 03/20/2018 and surgical options were reviewed. She is scheduled for a right and left heart catheterization with Dr. Excell Seltzerooper on 04/13/2018.   Past Medical History:  Diagnosis Date  . Anxiety   . Arthritis    "lower back" (04/04/2018)  . Chronic bronchitis (HCC)   . Chronic diastolic CHF (congestive heart failure) (HCC)   . Chronic lower back pain   . DDD (degenerative disc disease), lumbar   . Depression   . GERD (gastroesophageal reflux disease)   . Headache    "weekly" (04/04/2018)  . Heart murmur   . Hypertension   . Mitral valve disease   . Normocytic anemia   . Obesity   . Palpitations   . Pericarditis   . Premature atrial contractions   . Pulmonary hypertension (HCC)   . PVC's (premature ventricular  contractions)    a. h/o palpitations with event monitor in 03/2017 showing NSR with PACs/PVCs.  . Recurrent mitral valve stenosis and regurgitation s/p mitral valve repair   . S/P mitral valve repair 03/27/2013   [place] complex valvuloplasty including resuspension of entire posterior leaflet using Gore-tex neochords and 30 mm Edwards Physio ring annuloplasty  . Sarcoidosis   . Tobacco abuse   . Tricuspid regurgitation     Past Surgical History:  Procedure Laterality Date  . ABDOMINAL HERNIA REPAIR  ~ 2011  . ABDOMINAL HYSTERECTOMY  2011   "partial"; PID w/problem with fallopian tubes, had left fallopian and left ovary removed, pt still having periods  . CARDIAC CATHETERIZATION  2014  . CESAREAN SECTION  2004; 2009  . HERNIA REPAIR    . MITRAL VALVE REPAIR  03/27/2013   [place]. [place]arcus [place] - complex valvuloplasty including resuspension of posterior leaflet with 30 mm CE Physio ring annuloplasty  . MULTIPLE EXTRACTIONS WITH ALVEOLOPLASTY N/A 04/03/2018   Procedure: Extraction of tooth #30 with alveoloplasty and gross debridement of remaining teeth;  Surgeon: Charlynne PanderKulinski, Ronald F, DDS;  Location[facility];  Service: Oral Surgery;  Laterality: N/A;  . PARTIAL HYSTERECTOMY  2011   PID w/problem with fallopian tubes, had left fallopian and left ovary removed, pt still having periods  .  TEE WITHOUT CARDIOVERSION N/A 05/26/2016   Procedure: TRANSESOPHAGEAL ECHOCARDIOGRAM (TEE);  Surgeon: [name], MD;  Location: [facility];  Service: Cardiovascular;  Laterality: N/A;  . TEE WITHOUT CARDIOVERSION N/A 01/25/2018   Procedure: TRANSESOPHAGEAL ECHOCARDIOGRAM (TEE) WITH PROPOFOL;  Surgeon: Jonelle Sidle, MD;  Location: [facility];  Service: Cardiovascular;  Laterality: N/A;    Current Medications: Facility-Administered Medications Prior to Visit  Medication Dose Route Frequency Provider Last Rate Last Dose  . acidophilus (RISAQUAD)  capsule 1 capsule  1 capsule Oral Daily Rankin, Shuvon B, NP   1 capsule at 04/06/18 0804  . albuterol (PROVENTIL) (2.5 MG/3ML) 0.083% nebulizer solution 2.5 mg  2.5 mg Nebulization Q6H PRN Rankin, Shuvon B, NP      . bisacodyl (DULCOLAX) suppository 10 mg  10 mg Rectal Daily PRN Rankin, Shuvon B, NP      . clindamycin (CLEOCIN) capsule 300 mg  300 mg Oral Q8H Rankin, Shuvon B, NP   300 mg at 04/07/18 0627  . docusate sodium (COLACE) capsule 100 mg  100 mg Oral Daily PRN Rankin, Shuvon B, NP      . ferrous sulfate tablet 325 mg  325 mg Oral Q breakfast Rankin, Shuvon B, NP   325 mg at 04/06/18 0804  . HYDROcodone-acetaminophen (NORCO/VICODIN) 5-325 MG per tablet 1-2 tablet  1-2 tablet Oral Q6H PRN Rankin, Shuvon B, NP   2 tablet at 04/06/18 2038  . LORazepam (ATIVAN) tablet 0.5 mg  0.5 mg Oral Q6H PRN Cobos, Fernando A, MD   0.5 mg at 04/06/18 2156  . nicotine (NICODERM CQ - dosed in mg/24 hours) patch 14 mg  14 mg Transdermal Daily Cobos, Rockey Situ, MD   14 mg at 04/06/18 0805  . QUEtiapine (SEROQUEL) tablet 25 mg  25 mg Oral QHS Rankin, Shuvon B, NP   25 mg at 04/06/18 2156  . senna-docusate (Senokot-S) tablet 1 tablet  1 tablet Oral QHS PRN Rankin, Shuvon B, NP      . sertraline (ZOLOFT) tablet 25 mg  25 mg Oral Daily Cobos, Rockey Situ, MD   25 mg at 04/06/18 1705  . torsemide (DEMADEX) tablet 20 mg  20 mg Oral BID Rankin, Shuvon B, NP   20 mg at 04/06/18 1705   Outpatient Medications Prior to Visit  Medication Sig Dispense Refill  . albuterol (PROVENTIL HFA;VENTOLIN HFA) 108 (90 Base) MCG/ACT inhaler Inhale 2 puffs into the lungs every 6 (six) hours as needed for wheezing or shortness of breath.    . citalopram (CELEXA) 40 MG tablet Take 40 mg by mouth 2 (two) times daily.    . clindamycin (CLEOCIN) 300 MG capsule Take 1 capsule by mouth every 8 hours for 5 days. 15 capsule 0  . docusate sodium (COLACE) 100 MG capsule Take 100 mg by mouth daily as needed for moderate constipation.     Marland Kitchen  EPINEPHrine (EPIPEN 2-PAK) 0.3 mg/0.3 mL IJ SOAJ injection Inject 0.3 Units into the muscle once.     . ferrous sulfate 325 (65 FE) MG tablet Take 1 tablet (325 mg total) by mouth 2 (two) times daily with a meal. (Patient taking differently: Take 325 mg by mouth daily with breakfast. )  3  . metoprolol succinate (TOPROL-XL) 25 MG 24 hr tablet TAKE 1 TABLET BY MOUTH DAILY (Patient taking differently: Take 37.5 mg by mouth once daily) 30 tablet 6  . potassium chloride SA (K-DUR,KLOR-CON) 20 MEQ tablet Take 2 tablets (40 mEq total) by mouth daily. (  Patient taking differently: Take 40 mEq by mouth 2 (two) times daily. ) 60 tablet 2  . Probiotic Product (PROBIOTIC PO) Take 1 capsule by mouth daily.    . QUEtiapine (SEROQUEL) 25 MG tablet Take 25 mg by mouth at bedtime.    . torsemide (DEMADEX) 20 MG tablet Take 20 mg by mouth 2 (two) times daily.       Allergies:   Bee venom; Lisinopril; Penicillins; Perflutren lipid microsphere; Shellfish allergy; and Contrast media [iodinated diagnostic agents]   Social History   Socioeconomic History  . Marital status: Single    Spouse name: Not on file  . Number of children: 2  . Years of education: Not on file  . Highest education level: Not on file  Occupational History  . Not on file  Social Needs  . Financial resource strain: Not on file  . Food insecurity:    Worry: Not on file    Inability: Not on file  . Transportation needs:    Medical: Not on file    Non-medical: Not on file  Tobacco Use  . Smoking status: Current Some Day Smoker    Packs/day: 0.50    Years: 24.00    Pack years: 12.00    Types: Cigarettes    Start date: 11/03/1999    Last attempt to quit: 03/20/2018    Years since quitting: 0.0  . Smokeless tobacco: Never Used  Substance and Sexual Activity  . Alcohol use: Yes    Comment: 04/04/2018 "1-2 beers/month; if that"  . Drug use: Not Currently  . Sexual activity: Not Currently  Lifestyle  . Physical activity:    Days per  week: Not on file    Minutes per session: Not on file  . Stress: Not on file  Relationships  . Social connections:    Talks on phone: Not on file    Gets together: Not on file    Attends religious service: Not on file    Active member of club or organization: Not on file    Attends meetings of clubs or organizations: Not on file    Relationship status: Not on file  Other Topics Concern  . Not on file  Social History Narrative  . Not on file     Family History:  The patient's ***family history includes Heart disease in her paternal grandfather; Heart failure in her father.   Review of Systems:   Please see the history of present illness.     General:  No chills, fever, night sweats or weight changes.  Cardiovascular:  No chest pain, dyspnea on exertion, edema, orthopnea, palpitations, paroxysmal nocturnal dyspnea. Dermatological: No rash, lesions/masses Respiratory: No cough, dyspnea Urologic: No hematuria, dysuria Abdominal:   No nausea, vomiting, diarrhea, bright red blood per rectum, melena, or hematemesis Neurologic:  No visual changes, wkns, changes in mental status. All other systems reviewed and are otherwise negative except as noted above.   Physical Exam:    VS:  LMP 03/15/2018    General: Well developed, well nourished,female appearing in no acute distress. Head: Normocephalic, atraumatic, sclera non-icteric, no xanthomas, nares are without discharge.  Neck: No carotid bruits. JVD not elevated.  Lungs: Respirations regular and unlabored, without wheezes or rales.  Heart: ***Regular rate and rhythm. No S3 or S4.  No murmur, no rubs, or gallops appreciated. Abdomen: Soft, non-tender, non-distended with normoactive bowel sounds. No hepatomegaly. No rebound/guarding. No obvious abdominal masses. Msk:  Strength and tone appear normal for age. No joint  deformities or effusions. Extremities: No clubbing or cyanosis. No edema.  Distal pedal pulses are 2+  bilaterally. Neuro: Alert and oriented X 3. Moves all extremities spontaneously. No focal deficits noted. Psych:  Responds to questions appropriately with a normal affect. Skin: No rashes or lesions noted  Wt Readings from Last 3 Encounters:  04/05/18 226 lb 11.2 oz (102.8 kg)  03/31/18 217 lb (98.4 kg)  03/20/18 218 lb (98.9 kg)        Studies/Labs Reviewed:   EKG:  EKG is*** ordered today.  The ekg ordered today demonstrates ***  Recent Labs: 06/11/2017: TSH 2.139 07/06/2017: ALT 9 03/07/2018: B Natriuretic Peptide 333.0 03/08/2018: Magnesium 2.2 04/04/2018: BUN 23; Creatinine, Ser 1.05; Hemoglobin 8.9; Platelets 404; Potassium 4.2; Sodium 138   Lipid Panel No results found for: CHOL, TRIG, HDL, CHOLHDL, VLDL, LDLCALC, LDLDIRECT  Additional studies/ records that were reviewed today include:   TEE: 01/25/2018 Study Conclusions  - Left ventricle: Systolic function was normal. The estimated   ejection fraction was in the range of 55% to 60%. Wall motion was   normal; there were no regional wall motion abnormalities. - Aorta: Mild atheroschlerotic plaque noted within the aortic arch. - Mitral valve: Moderately thickened leaflets with mild tethering   of the anterior leaflet and restricted motion of the posterior   leaflet. Chordae appear thickened and shortened as well. Annular   ring present. The findings are consistent with moderate to severe   stenosis. There was mild regurgitation directed eccentrically.   Mean gradient (D): 9 mm Hg. Planimetered valve area: 1.39 cm^2. - Left atrium: The atrium was dilated. There was moderatecontinuous   spontaneous echo contrast (&quot;smoke&quot;) in the appendage. Emptying   velocity was severely reduced. - Right ventricle: The cavity size was mildly dilated. - Right atrium: The atrium was dilated. - Atrial septum: No defect or patent foramen ovale was identified.   Few isolated bubbles noted in left atrium after 4-5 beats. -  Tricuspid valve: There was moderate regurgitation. Peak RV-RA   gradient (S): 52 mm Hg. - Pulmonary arteries: Systolic pressure was not measured directly,   but based on RVSP is moderately to severely increased. - Pericardium, extracardiac: There was no pericardial effusion.  Assessment:    No diagnosis found.   Plan:   In order of problems listed above:  1. ***    Medication Adjustments/Labs and Tests Ordered: Current medicines are reviewed at length with the patient today.  Concerns regarding medicines are outlined above.  Medication changes, Labs and Tests ordered today are listed in the Patient Instructions below. There are no Patient Instructions on file for this visit.   Signed, Ellsworth Lennox, PA-C  04/07/2018 7:24 AM    [facility] [address] [place], [place] [address] Phone: [phone]

## 2018-04-08 MED ORDER — QUETIAPINE FUMARATE 50 MG PO TABS
50.0000 mg | ORAL_TABLET | Freq: Every day | ORAL | Status: DC
  Administered 2018-04-08 – 2018-04-14 (×7): 50 mg via ORAL
  Filled 2018-04-08 (×10): qty 1

## 2018-04-08 NOTE — Progress Notes (Addendum)
[facility] MD Progress Note  04/08/2018 4:10 PM AMAZING COWMAN  MRN:  [id number] Subjective: patient describes partial improvement compared to admission, but still feels vaguely depressed, apprehensive. Denies SI. Denies medication side effects at this time.Complains of insomnia.  Objective: I have discussed case with treatment team and have met with patient. 37 year old female, history of cardiac illness (mitral valve stenosis), reports she is scheduled for upcoming elective surgery for mitral valve replacement, date is not yet set.  As part of preparation for this procedure underwent a tooth extraction and debridement.  During  this procedure reported depression, suicidal ideations, leading to psychiatric admission. Patient reports some improvement compared to how she felt prior to admission, but remains somewhat depressed and anxious. Affect does brighten during session and she is responsive to support, reassurance.  She has been going to groups, is visible in dayroom. No disruptive or agitated behaviors. Denies medication side effects at present. Denies suicidal ideations.  Principal Problem: Depression/anxiety  Diagnosis:   Patient Active Problem List   Diagnosis Date Noted  . MDD (major depressive disorder), recurrent episode ([place]) [F33.9] 04/05/2018  . S/P tooth extraction [K08.409] 04/03/2018  . Suicidal ideation [R45.851] 04/03/2018  . MDD (major depressive disorder) [F32.9]   . Chronic diastolic congestive heart failure ([place]) [I50.32]   . Tricuspid regurgitation [I07.1]   . Hypomagnesemia [E83.42] 03/07/2018  . Tobacco abuse [Z72.0]   . Dyspnea [R06.00] 01/11/2018  . Prolonged QT interval [R94.31] 09/10/2017  . Normochromic normocytic anemia [D64.9]   . Pulmonary edema [J81.1] 07/05/2017  . Hypokalemia [E87.6] 06/12/2017  . Pulmonary hypertension ([place]) [I27.20] 06/12/2017  . Flash pulmonary edema ([place]) [J81.0] 06/11/2017  . Depression with anxiety [F41.8] 06/11/2017  . Recurrent  mitral valve stenosis and regurgitation s/p mitral valve repair [I05.2]   . Acute pulmonary edema ([place]) [J81.0] 10/10/2016  . CAP (community acquired pneumonia) [J18.9] 05/25/2016  . Leukocytosis [D72.829] 05/24/2016  . Acute respiratory failure with hypoxia ([place]) [J96.01] 05/23/2016  . Acute on chronic diastolic CHF (congestive heart failure) ([place]) [I50.33] 05/23/2016  . Cough with hemoptysis [R04.2] 05/23/2016  . Postpartum complication pericarditis in 2009 with eventual needing MVR in 2015 [O90.89] 05/23/2016  . Anemia, iron deficiency [D50.9] 05/23/2016  . Hypertension [I10]   . SOB (shortness of breath) [R06.02]   . Respiratory distress [R06.03] 02/16/2016  . Essential hypertension [I10] 02/16/2016  . S/P mitral valve repair [Z98.890] 03/27/2013   Total Time spent with patient:15  minutes   Past Psychiatric History:   Past Medical History:  Past Medical History:  Diagnosis Date  . Anxiety   . Arthritis    "lower back" (04/04/2018)  . Chronic bronchitis ([place])   . Chronic diastolic CHF (congestive heart failure) ([place])   . Chronic lower back pain   . DDD (degenerative disc disease), lumbar   . Depression   . GERD (gastroesophageal reflux disease)   . Headache    "weekly" (04/04/2018)  . Heart murmur   . Hypertension   . Mitral valve disease   . Normocytic anemia   . Obesity   . Palpitations   . Pericarditis   . Premature atrial contractions   . Pulmonary hypertension ([place])   . PVC's (premature ventricular contractions)    a. h/o palpitations with event monitor in 03/2017 showing NSR with PACs/PVCs.  . Recurrent mitral valve stenosis and regurgitation s/p mitral valve repair   . S/P mitral valve repair 03/27/2013   Dr. Fransico Michael - [place], [place] - complex valvuloplasty including resuspension of entire  posterior leaflet using Gore-tex neochords and 30 mm Edwards Physio ring annuloplasty  . Sarcoidosis   . Tobacco abuse   . Tricuspid regurgitation     Past Surgical  History:  Procedure Laterality Date  . ABDOMINAL HERNIA REPAIR  ~ 2011  . ABDOMINAL HYSTERECTOMY  2011   "partial"; PID w/problem with fallopian tubes, had left fallopian and left ovary removed, pt still having periods  . CARDIAC CATHETERIZATION  2014  . CESAREAN SECTION  2004; 2009  . HERNIA REPAIR    . MITRAL VALVE REPAIR  03/27/2013   Dr. Fransico Michael - [place], [place]. - complex valvuloplasty including resuspension of posterior leaflet with 30 mm CE Physio ring annuloplasty  . MULTIPLE EXTRACTIONS WITH ALVEOLOPLASTY N/A 04/03/2018   Procedure: Extraction of tooth #30 with alveoloplasty and gross debridement of remaining teeth;  Surgeon: Lenn Cal, DDS;  Location: [place];  Service: Oral Surgery;  Laterality: N/A;  . PARTIAL HYSTERECTOMY  2011   PID w/problem with fallopian tubes, had left fallopian and left ovary removed, pt still having periods  . TEE WITHOUT CARDIOVERSION N/A 05/26/2016   Procedure: TRANSESOPHAGEAL ECHOCARDIOGRAM (TEE);  Surgeon: Herminio Commons, MD;  [facility];  Service: Cardiovascular;  Laterality: N/A;  . TEE WITHOUT CARDIOVERSION N/A 01/25/2018   Procedure: TRANSESOPHAGEAL ECHOCARDIOGRAM (TEE) WITH PROPOFOL;  Surgeon: Satira Sark, MD;  [facility];  Service: Cardiovascular;  Laterality: N/A;   Family History:  Family History  Problem Relation Age of Onset  . Heart failure Father        just received LVAD  . Heart disease Paternal Grandfather        stent placement   Family Psychiatric  History:  Social History:  Social History   Substance and Sexual Activity  Alcohol Use Yes   Comment: 04/04/2018 "1-2 beers/month; if that"     Social History   Substance and Sexual Activity  Drug Use Not Currently    Social History   Socioeconomic History  . Marital status: Single    Spouse name: Not on file  . Number of children: 2  . Years of education: Not on file  . Highest education level: Not on file  Occupational  History  . Not on file  Social Needs  . Financial resource strain: Not on file  . Food insecurity:    Worry: Not on file    Inability: Not on file  . Transportation needs:    Medical: Not on file    Non-medical: Not on file  Tobacco Use  . Smoking status: Current Some Day Smoker    Packs/day: 0.50    Years: 24.00    Pack years: 12.00    Types: Cigarettes    Start date: 11/03/1999    Last attempt to quit: 03/20/2018    Years since quitting: 0.0  . Smokeless tobacco: Never Used  Substance and Sexual Activity  . Alcohol use: Yes    Comment: 04/04/2018 "1-2 beers/month; if that"  . Drug use: Not Currently  . Sexual activity: Not Currently  Lifestyle  . Physical activity:    Days per week: Not on file    Minutes per session: Not on file  . Stress: Not on file  Relationships  . Social connections:    Talks on phone: Not on file    Gets together: Not on file    Attends religious service: Not on file    Active member of club or organization: Not on file  Attends meetings of clubs or organizations: Not on file    Relationship status: Not on file  Other Topics Concern  . Not on file  Social History Narrative  . Not on file   Additional Social History:   Sleep: Fair  Appetite:  improving  Current Medications: Current Facility-Administered Medications  Medication Dose Route Frequency Provider Last Rate Last Dose  . acidophilus (RISAQUAD) capsule 1 capsule  1 capsule Oral Daily Rankin, Shuvon B, NP   1 capsule at 04/08/18 0806  . albuterol (PROVENTIL) (2.5 MG/3ML) 0.083% nebulizer solution 2.5 mg  2.5 mg Nebulization Q6H PRN Rankin, Shuvon B, NP      . bisacodyl (DULCOLAX) suppository 10 mg  10 mg Rectal Daily PRN Rankin, Shuvon B, NP      . clindamycin (CLEOCIN) capsule 300 mg  300 mg Oral Q8H Rankin, Shuvon B, NP   300 mg at 04/08/18 1513  . docusate sodium (COLACE) capsule 100 mg  100 mg Oral Daily PRN Rankin, Shuvon B, NP      . ferrous sulfate tablet 325 mg  325 mg Oral  Q breakfast Rankin, Shuvon B, NP   325 mg at 04/08/18 0806  . HYDROcodone-acetaminophen (NORCO/VICODIN) 5-325 MG per tablet 1 tablet  1 tablet Oral Q8H PRN Cobos, Myer Peer, MD   1 tablet at 04/07/18 1956  . LORazepam (ATIVAN) tablet 0.5 mg  0.5 mg Oral Q6H PRN Cobos, Fernando A, MD   0.5 mg at 04/07/18 2227  . nicotine (NICODERM CQ - dosed in mg/24 hours) patch 14 mg  14 mg Transdermal Daily Cobos, Myer Peer, MD   14 mg at 04/08/18 0807  . QUEtiapine (SEROQUEL) tablet 25 mg  25 mg Oral QHS Cobos, Myer Peer, MD   25 mg at 04/07/18 2227  . senna-docusate (Senokot-S) tablet 1 tablet  1 tablet Oral QHS PRN Rankin, Shuvon B, NP      . sertraline (ZOLOFT) tablet 50 mg  50 mg Oral Daily Cobos, Myer Peer, MD   50 mg at 04/08/18 0806  . torsemide (DEMADEX) tablet 20 mg  20 mg Oral BID Rankin, Shuvon B, NP   20 mg at 04/08/18 8127    Lab Results: No results found for this or any previous visit (from the past 48 hour(s)).  Blood Alcohol level:  No results found for: [facility]  Metabolic Disorder Labs: No results found for: HGBA1C, MPG No results found for: PROLACTIN No results found for: CHOL, TRIG, HDL, CHOLHDL, VLDL, LDLCALC  Physical Findings: AIMS: Facial and Oral Movements Muscles of Facial Expression: None, normal Lips and Perioral Area: None, normal Jaw: None, normal Tongue: None, normal,Extremity Movements Upper (arms, wrists, hands, fingers): None, normal Lower (legs, knees, ankles, toes): None, normal, Trunk Movements Neck, shoulders, hips: None, normal, Overall Severity Severity of abnormal movements (highest score from questions above): None, normal Incapacitation due to abnormal movements: None, normal Patient's awareness of abnormal movements (rate only patient's report): No Awareness, Dental Status Current problems with teeth and/or dentures?: No Does patient usually wear dentures?: No  CIWA:    COWS:     Musculoskeletal: Strength & Muscle Tone: within normal limits Gait &  Station: normal Patient leans: N/A  Psychiatric Specialty Exam: Physical Exam  ROS no fever, no chills, mouth pain related to recent dental procedure, no chest pain, no shortness of breath, no vomiting  Blood pressure 125/70, pulse 86, temperature 97.9 F (36.6 C), temperature source Oral, resp. rate 16, height _0  (1.626 m), weight 100.7 kg (  222 lb), last menstrual period 03/15/2018.Body mass index is 38.11 kg/m.  General Appearance: Well Groomed  Eye Contact:  Good  Speech:  Normal Rate  Volume:  Normal  Mood:  partially improved mood   Affect:  more reactive, remains anxious  Thought Process:  Goal Directed and Descriptions of Associations: Intact  Orientation:  Full (Time, Place, and Person)  Thought Content:  No hallucinations, no delusions expressed, somatic preoccupations  Suicidal Thoughts:  No currently denies suicidal ideations, denies self-injurious ideations, contracts for safety on the unit, denies homicidal or violent ideations  Homicidal Thoughts:  No  Memory:  Recent and remote grossly intact  Judgement:  Other:  improving   Insight:  improving   Psychomotor Activity:  Normal  Concentration:  Concentration: Good and Attention Span: Good  Recall:  Good  Fund of Knowledge:  Good  Language:  Good  Akathisia:  Negative  Handed:  Right  AIMS (if indicated):     Assets:  Desire for Improvement Resilience  ADL's:  Intact  Cognition:  WNL  Sleep:  Number of Hours: 6   Assessment-37 year old female, history of mitral valve stenosis who is scheduled for an upcoming valve replacement surgery.  Recent dental procedure (extraction, debridement), during which she reported worsening depression and suicidal ideations.  Has reported anxiety and vague apprehension about her physical health and upcoming surgery.  Presents with partially improved mood , affect more reactive, remains vaguely anxious , apprehensive. Tolerating medications well. Denies SI. Reports she is not sleeping  well, woke up several times last night.   Treatment Plan Summary: Daily contact with patient to assess and evaluate symptoms and progress in treatment, Medication management, Plan inpatient treatment and medications as below  Treatment Plan reviewed today 7/27  Encourage group and milieu participation to work on coping skills and symptom reduction Continue Ativan 0.5 mg every 6 hours PRN for anxiety or insomnia Increase  Seroquel to 50  mg nightly for insomnia, mood disorder Continue Zoloft to 50 mg daily for depression, anxiety Continue  Hydrocodone 5 mgrs every 8 hours PRN for pain-will taper off as pain improves  Continue ferrous sulfate for anemia. Check CBC in AM to monitor Hgb/Hct Treatment team working on disposition planning options. Jenne Campus, MD 04/08/2018, 4:10 PM   Patient ID: Theresa Crawford, female   DOB: 1981-02-24, 37 y.o.   MRN: [id number]

## 2018-04-08 NOTE — Progress Notes (Signed)
Writer spoke with patient 1:1 shortly after shift change. She c/o tooth pain and requested pain medication which she received it. Patient was later observed  In the dayroom interacting with peers laughing and talking.She was informed of her medications scheduled

## 2018-04-08 NOTE — Progress Notes (Signed)
Patient stated that she had a bad start to her day but had a good ending due in large part to her peers and their support. Her goal for tomorrow is to "pray more and worry less".

## 2018-04-08 NOTE — Progress Notes (Signed)
Adult Psychoeducational Group Note  Date:  04/08/2018 Time:  2:01 AM  Group Topic/Focus:  Wrap-Up Group:   The focus of this group is to help patients review their daily goal of treatment and discuss progress on daily workbooks.  Participation Level:  Active  Participation Quality:  Appropriate  Affect:  Appropriate  Cognitive:  Alert  Insight: Appropriate  Engagement in Group:  Engaged  Modes of Intervention:  Discussion  Additional Comments:  Pt stated that she had a good day.  Pt stated that spending time with other patients on the unit lifted her spirits.  Pt rated the day at a 7/10.  Theresa Crawford 04/08/2018, 2:01 AM

## 2018-04-08 NOTE — Progress Notes (Signed)
Patient has been observed up in the dayroom interacting with peers, laughing and talking. She reported that her tooth has not bothered her much today. Writer informed her of increase in Seroquel and she is hopeful it will help her rest tonight. She reported that she did not sleep last night and was very anxious and reported that she cleaned the bathroom floor on last night. Writer encouraged her to make sure she lets the mht on the hall know if unable to sleep. Safety maintained on unit with 15 min checks.

## 2018-04-08 NOTE — [facility] Group Notes (Addendum)
LCSW Group Therapy Note  04/08/2018    10:00 - 11:00 AM               Type of Therapy and Topic:  Group Therapy: Anger Cues and Responses  Participation Level:  Active  In this group, patients learned how to recognize the physical, cognitive, emotional, and behavioral responses they have to anger-provoking situations.  They identified a recent time they became angry and how they reacted.  They analyzed how their reaction was possibly beneficial and how it was possibly unhelpful.  The group discussed anger warning signs and how to know when our anger can potentially become a problem. The group will discuss the cycle of anger and how our thoughts impact our feelings which in result affect our behaviors. Patients will explore alternative emotions in addition to feeling anger. Patients will share with the group and learn from CSW but also other patients coping skills that work for others.   Therapeutic Goals: 1. Patients will remember their last incident of anger and how they felt emotionally and physically, what their thoughts were at the time, and how they behaved. 2. Patients will identify how their behavior at that time worked for them, as well as how it worked against them. 3. Patients will explore how their body, mind and feelings play a role with anger. 4. Patients will learn that anger itself is normal and cannot be eliminated, and that healthier reactions can assist with resolving conflict rather than worsening situations. 5. Patients will learn "I statements" to increase communication skills and the importance of positive thoughts.  6. Patients will learn coping skills from their peers as well as from the CSW.   Summary of Patient Progress:  Patient was engaged and participated throughout the group session. The patient shared that her most recent time of happy angry was yesterday because her friend could not understand her and brought her petroleum jelly instead of hair grease and thought she  said work shirt when asking for a puzzle book. Patient shared she was really angry a couple days ago but declined sharing. Patient reports their triggers include: dwelling on past situations, friends and family intentionally setting me off. Patient identified the following to be their warning signs of anger: stomach aches, headaches, crying and grinding her teeth. Patient reports planning to add setting boundaries and removing herself entirely from situations as a coping skill upon discharge from the hospital. Patient shared she wants to learn how to forgive herself and others still.   Therapeutic Modalities:   Cognitive Behavioral Therapy  Shellia CleverlyStephanie N Adlai Nieblas, LCSW  04/08/2018 11:57 AM

## 2018-04-08 NOTE — Progress Notes (Signed)
D: Patient is not as focused on her tooth extraction today.  She reports poor sleep, fair appetite, low energy and poor concentration.  Her goal today is to "to focus on being patient with myself.  Learn to slow down breathing when I get anxious."  She is visible in the milieu and interacting well with her peers.  She denies any thoughts of self harm.  A: Continue to monitor medication management and MD orders.  Safety checks continued every 15 minutes per protocol.  Offer support and encouragement as needed.  R: Patient is receptive to staff; her behavior is appropriate.

## 2018-04-09 LAB — CBC WITH DIFFERENTIAL/PLATELET
BASOS ABS: 0 10*3/uL (ref 0.0–0.1)
Basophils Relative: 0 %
Eosinophils Absolute: 0.1 10*3/uL (ref 0.0–0.7)
Eosinophils Relative: 1 %
HEMATOCRIT: 29.4 % — AB (ref 36.0–46.0)
HEMOGLOBIN: 9.3 g/dL — AB (ref 12.0–15.0)
LYMPHS PCT: 17 %
Lymphs Abs: 1.8 10*3/uL (ref 0.7–4.0)
MCH: 24.3 pg — ABNORMAL LOW (ref 26.0–34.0)
MCHC: 31.6 g/dL (ref 30.0–36.0)
MCV: 77 fL — AB (ref 78.0–100.0)
MONOS PCT: 5 %
Monocytes Absolute: 0.5 10*3/uL (ref 0.1–1.0)
NEUTROS ABS: 8.7 10*3/uL — AB (ref 1.7–7.7)
Neutrophils Relative %: 77 %
Platelets: 470 10*3/uL — ABNORMAL HIGH (ref 150–400)
RBC: 3.82 MIL/uL — ABNORMAL LOW (ref 3.87–5.11)
RDW: 19.9 % — AB (ref 11.5–15.5)
WBC: 11.2 10*3/uL — ABNORMAL HIGH (ref 4.0–10.5)

## 2018-04-09 NOTE — Progress Notes (Signed)
Writer observed patient up in the dayroom coloring. Writer spoke with her 1:1 and she reports having had a good day. She is bright and bubbly. She laughs and jokes with staff. She was informed of medications at hs. She requested that her head of bed be elevated to alleviate SOB at bedtime which was done. Support given and safety maintained on unit with 15 min checks.

## 2018-04-09 NOTE — Progress Notes (Signed)
Adult Psychoeducational Group Note  Date:  04/09/2018 Time:  9:54 PM  Group Topic/Focus:  Wrap-Up Group:   The focus of this group is to help patients review their daily goal of treatment and discuss progress on daily workbooks.  Participation Level:  Active  Participation Quality:  Appropriate  Affect:  Appropriate  Cognitive:  Alert  Insight: Appropriate  Engagement in Group:  Engaged  Modes of Intervention:  Discussion  Additional Comments:  Pt rated her day 8/10. She stated that she has learned some new coping skills. She feels that she is progressing and learning how to express herself.   Kaleen OdeaCOOKE, Minna Dumire R 04/09/2018, 9:54 PM

## 2018-04-09 NOTE — Progress Notes (Addendum)
[facility] MD Progress Note  04/09/2018 1:34 PM Theresa Crawford  MRN:  [id number] Subjective: I am not having a good day.  I received a phone call about things I did not want to discuss.  Also did not sleep well.   Objective: I have discussed case with treatment team and have met with patient. 37 year old female, history of cardiac illness (mitral valve stenosis), reports she is scheduled for upcoming elective surgery for mitral valve replacement, date is not yet set.  As a result patient noted increased stressors, worsening depression, suicidal thoughts leading to this admission.  Patient is noted to communicate and address writer appropriately during the evaluation.  During the evaluation patient is observed with physical symptoms of anxiety, and increased psychomotor agitation.  She reports having a history of trichotillomania, and reports using coping skills to help decrease anxiety."I pull my hair but I did them for my eyelashes or eyebrows.  As you can see they are trying to grow back.  During this evaluation patient rates her anxiety and depression 8 out of 10 with 10 being the worst."She reports increase Seroquel and Ativan does, with moderate improvement in sleep.  She reports having a history of chronic insomnia, and has not found a psychotropic agent to assist with the 4 nights rest.  She reports her goal for today is to attend groups, and stopped pulling her hair out.  She denies any suicidal ideations, homicidal ideations, auditory visual hallucinations. Principal Problem: Depression/anxiety  Diagnosis:   Patient Active Problem List   Diagnosis Date Noted  . MDD (major depressive disorder), recurrent episode ([place]) [F33.9] 04/05/2018  . S/P tooth extraction [K08.409] 04/03/2018  . Suicidal ideation [R45.851] 04/03/2018  . MDD (major depressive disorder) [F32.9]   . Chronic diastolic congestive heart failure ([place]) [I50.32]   . Tricuspid regurgitation [I07.1]   . Hypomagnesemia [E83.42]  03/07/2018  . Tobacco abuse [Z72.0]   . Dyspnea [R06.00] 01/11/2018  . Prolonged QT interval [R94.31] 09/10/2017  . Normochromic normocytic anemia [D64.9]   . Pulmonary edema [J81.1] 07/05/2017  . Hypokalemia [E87.6] 06/12/2017  . Pulmonary hypertension ([place]) [I27.20] 06/12/2017  . Flash pulmonary edema ([place]) [J81.0] 06/11/2017  . Depression with anxiety [F41.8] 06/11/2017  . Recurrent mitral valve stenosis and regurgitation s/p mitral valve repair [I05.2]   . Acute pulmonary edema ([place]) [J81.0] 10/10/2016  . CAP (community acquired pneumonia) [J18.9] 05/25/2016  . Leukocytosis [D72.829] 05/24/2016  . Acute respiratory failure with hypoxia ([place]) [J96.01] 05/23/2016  . Acute on chronic diastolic CHF (congestive heart failure) ([place]) [I50.33] 05/23/2016  . Cough with hemoptysis [R04.2] 05/23/2016  . Postpartum complication pericarditis in 2009 with eventual needing MVR in 2015 [O90.89] 05/23/2016  . Anemia, iron deficiency [D50.9] 05/23/2016  . Hypertension [I10]   . SOB (shortness of breath) [R06.02]   . Respiratory distress [R06.03] 02/16/2016  . Essential hypertension [I10] 02/16/2016  . S/P mitral valve repair [Z98.890] 03/27/2013   Total Time spent with patient:15  minutes   Past Psychiatric History:   Past Medical History:  Past Medical History:  Diagnosis Date  . Anxiety   . Arthritis    "lower back" (04/04/2018)  . Chronic bronchitis ([place])   . Chronic diastolic CHF (congestive heart failure) ([place])   . Chronic lower back pain   . DDD (degenerative disc disease), lumbar   . Depression   . GERD (gastroesophageal reflux disease)   . Headache    "weekly" (04/04/2018)  . Heart murmur   . Hypertension   . Mitral valve  disease   . Normocytic anemia   . Obesity   . Palpitations   . Pericarditis   . Premature atrial contractions   . Pulmonary hypertension ([place])   . PVC's (premature ventricular contractions)    a. h/o palpitations with event monitor in 03/2017 showing NSR  with PACs/PVCs.  . Recurrent mitral valve stenosis and regurgitation s/p mitral valve repair   . S/P mitral valve repair 03/27/2013   Dr. Fransico Michael - [place], [place] - complex valvuloplasty including resuspension of entire posterior leaflet using Gore-tex neochords and 30 mm Edwards Physio ring annuloplasty  . Sarcoidosis   . Tobacco abuse   . Tricuspid regurgitation     Past Surgical History:  Procedure Laterality Date  . ABDOMINAL HERNIA REPAIR  ~ 2011  . ABDOMINAL HYSTERECTOMY  2011   "partial"; PID w/problem with fallopian tubes, had left fallopian and left ovary removed, pt still having periods  . CARDIAC CATHETERIZATION  2014  . CESAREAN SECTION  2004; 2009  . HERNIA REPAIR    . MITRAL VALVE REPAIR  03/27/2013   Dr. Fransico Michael - [place], [place]. - complex valvuloplasty including resuspension of posterior leaflet with 30 mm CE Physio ring annuloplasty  . MULTIPLE EXTRACTIONS WITH ALVEOLOPLASTY N/A 04/03/2018   Procedure: Extraction of tooth #30 with alveoloplasty and gross debridement of remaining teeth;  Surgeon: Lenn Cal, DDS;  Location: [facility];  Service: Oral Surgery;  Laterality: N/A;  . PARTIAL HYSTERECTOMY  2011   PID w/problem with fallopian tubes, had left fallopian and left ovary removed, pt still having periods  . TEE WITHOUT CARDIOVERSION N/A 05/26/2016   Procedure: TRANSESOPHAGEAL ECHOCARDIOGRAM (TEE);  Surgeon: Herminio Commons, MD;  [facility];  Service: Cardiovascular;  Laterality: N/A;  . TEE WITHOUT CARDIOVERSION N/A 01/25/2018   Procedure: TRANSESOPHAGEAL ECHOCARDIOGRAM (TEE) WITH PROPOFOL;  Surgeon: Satira Sark, MD;  [facility];  Service: Cardiovascular;  Laterality: N/A;   Family History:  Family History  Problem Relation Age of Onset  . Heart failure Father        just received LVAD  . Heart disease Paternal Grandfather        stent placement   Family Psychiatric  History:  Social History:  Social History    Substance and Sexual Activity  Alcohol Use Yes   Comment: 04/04/2018 "1-2 beers/month; if that"     Social History   Substance and Sexual Activity  Drug Use Not Currently    Social History   Socioeconomic History  . Marital status: Single    Spouse name: Not on file  . Number of children: 2  . Years of education: Not on file  . Highest education level: Not on file  Occupational History  . Not on file  Social Needs  . Financial resource strain: Not on file  . Food insecurity:    Worry: Not on file    Inability: Not on file  . Transportation needs:    Medical: Not on file    Non-medical: Not on file  Tobacco Use  . Smoking status: Current Some Day Smoker    Packs/day: 0.50    Years: 24.00    Pack years: 12.00    Types: Cigarettes    Start date: 11/03/1999    Last attempt to quit: 03/20/2018    Years since quitting: 0.0  . Smokeless tobacco: Never Used  Substance and Sexual Activity  . Alcohol use: Yes    Comment: 04/04/2018 "1-2 beers/month; if that"  .  Drug use: Not Currently  . Sexual activity: Not Currently  Lifestyle  . Physical activity:    Days per week: Not on file    Minutes per session: Not on file  . Stress: Not on file  Relationships  . Social connections:    Talks on phone: Not on file    Gets together: Not on file    Attends religious service: Not on file    Active member of club or organization: Not on file    Attends meetings of clubs or organizations: Not on file    Relationship status: Not on file  Other Topics Concern  . Not on file  Social History Narrative  . Not on file   Additional Social History:   Sleep: Fair  Appetite:  improving  Current Medications: Current Facility-Administered Medications  Medication Dose Route Frequency Provider Last Rate Last Dose  . acidophilus (RISAQUAD) capsule 1 capsule  1 capsule Oral Daily Rankin, Shuvon B, NP   1 capsule at 04/09/18 0816  . albuterol (PROVENTIL) (2.5 MG/3ML) 0.083% nebulizer  solution 2.5 mg  2.5 mg Nebulization Q6H PRN Rankin, Shuvon B, NP      . bisacodyl (DULCOLAX) suppository 10 mg  10 mg Rectal Daily PRN Rankin, Shuvon B, NP      . docusate sodium (COLACE) capsule 100 mg  100 mg Oral Daily PRN Rankin, Shuvon B, NP      . ferrous sulfate tablet 325 mg  325 mg Oral Q breakfast Rankin, Shuvon B, NP   325 mg at 04/09/18 0815  . HYDROcodone-acetaminophen (NORCO/VICODIN) 5-325 MG per tablet 1 tablet  1 tablet Oral Q8H PRN Cobos, Myer Peer, MD   1 tablet at 04/07/18 1956  . LORazepam (ATIVAN) tablet 0.5 mg  0.5 mg Oral Q6H PRN Cobos, Myer Peer, MD   0.5 mg at 04/08/18 2133  . nicotine (NICODERM CQ - dosed in mg/24 hours) patch 14 mg  14 mg Transdermal Daily Cobos, Myer Peer, MD   14 mg at 04/09/18 0817  . QUEtiapine (SEROQUEL) tablet 50 mg  50 mg Oral QHS Cobos, Myer Peer, MD   50 mg at 04/08/18 2133  . senna-docusate (Senokot-S) tablet 1 tablet  1 tablet Oral QHS PRN Rankin, Shuvon B, NP      . sertraline (ZOLOFT) tablet 50 mg  50 mg Oral Daily Cobos, Myer Peer, MD   50 mg at 04/09/18 0816  . torsemide (DEMADEX) tablet 20 mg  20 mg Oral BID Rankin, Shuvon B, NP   20 mg at 04/09/18 0815    Lab Results:  Results for orders placed or performed during the hospital encounter of 04/05/18 (from the past 48 hour(s))  CBC with Differential/Platelet     Status: Abnormal   Collection Time: 04/09/18  6:37 AM  Result Value Ref Range   WBC 11.2 (H) 4.0 - 10.5 K/uL   RBC 3.82 (L) 3.87 - 5.11 MIL/uL   Hemoglobin 9.3 (L) 12.0 - 15.0 g/dL   HCT 29.4 (L) 36.0 - 46.0 %   MCV 77.0 (L) 78.0 - 100.0 fL   MCH 24.3 (L) 26.0 - 34.0 pg   MCHC 31.6 30.0 - 36.0 g/dL   RDW 19.9 (H) 11.5 - 15.5 %   Platelets 470 (H) 150 - 400 K/uL   Neutrophils Relative % 77 %   Neutro Abs 8.7 (H) 1.7 - 7.7 K/uL   Lymphocytes Relative 17 %   Lymphs Abs 1.8 0.7 - 4.0 K/uL   Monocytes Relative 5 %  Monocytes Absolute 0.5 0.1 - 1.0 K/uL   Eosinophils Relative 1 %   Eosinophils Absolute 0.1 0.0 - 0.7  K/uL   Basophils Relative 0 %   Basophils Absolute 0.0 0.0 - 0.1 K/uL    Comment: Performed at [facility], [place] [address], [place], [place] [address]    Blood Alcohol level:  No results found for: [facility]  Metabolic Disorder Labs: No results found for: HGBA1C, MPG No results found for: PROLACTIN No results found for: CHOL, TRIG, HDL, CHOLHDL, VLDL, LDLCALC  Physical Findings: AIMS: Facial and Oral Movements Muscles of Facial Expression: None, normal Lips and Perioral Area: None, normal Jaw: None, normal Tongue: None, normal,Extremity Movements Upper (arms, wrists, hands, fingers): None, normal Lower (legs, knees, ankles, toes): None, normal, Trunk Movements Neck, shoulders, hips: None, normal, Overall Severity Severity of abnormal movements (highest score from questions above): None, normal Incapacitation due to abnormal movements: None, normal Patient's awareness of abnormal movements (rate only patient's report): No Awareness, Dental Status Current problems with teeth and/or dentures?: No Does patient usually wear dentures?: No  CIWA:    COWS:     Musculoskeletal: Strength & Muscle Tone: within normal limits Gait & Station: normal Patient leans: N/A  Psychiatric Specialty Exam: Physical Exam   ROS  no fever, no chills, mouth pain related to recent dental procedure, no chest pain, no shortness of breath, no vomiting  Blood pressure 124/74, pulse (!) 105, temperature 98.6 F (37 C), temperature source Oral, resp. rate 16, height _0  (1.626 m), weight 100.7 kg (222 lb), last menstrual period 03/15/2018.Body mass index is 38.11 kg/m.  General Appearance: Well Groomed  Eye Contact:  Good  Speech:  Normal Rate  Volume:  Normal  Mood:  partially improved mood   Affect:  more reactive, remains anxious  Thought Process:  Goal Directed and Descriptions of Associations: Intact  Orientation:  Full (Time, Place, and Person)  Thought Content:  No  hallucinations, no delusions expressed, somatic preoccupations  Suicidal Thoughts:  No currently denies suicidal ideations, denies self-injurious ideations, contracts for safety on the unit, denies homicidal or violent ideations  Homicidal Thoughts:  No  Memory:  Recent and remote grossly intact  Judgement:  Other:  improving   Insight:  improving   Psychomotor Activity:  Normal  Concentration:  Concentration: Good and Attention Span: Good  Recall:  Good  Fund of Knowledge:  Good  Language:  Good  Akathisia:  Negative  Handed:  Right  AIMS (if indicated):     Assets:  Desire for Improvement Resilience  ADL's:  Intact  Cognition:  WNL  Sleep:  Number of Hours: 5.25   Assessment-37 year old female, history of mitral valve stenosis who is scheduled for an upcoming valve replacement surgery.  Recent dental procedure (extraction, debridement), during which she reported worsening depression and suicidal ideations.  Has reported anxiety and vague apprehension about her physical health and upcoming surgery.  Presents with partially improved mood , affect more reactive, remains vaguely anxious , apprehensive. Tolerating medications well. Denies SI. Reports she is not sleeping well, woke up several times last night.   Treatment Plan Summary: Daily contact with patient to assess and evaluate symptoms and progress in treatment, Medication management, Plan inpatient treatment and medications as below  Treatment Plan reviewed today 7/27  Encourage group and milieu participation to work on coping skills and symptom reduction Continue Ativan 0.5 mg every 6 hours PRN for anxiety or insomnia Increase  Seroquel to 50  mg  nightly for insomnia, mood disorder Continue Zoloft to 50 mg daily for depression, anxiety Continue  Hydrocodone 5 mgrs every 8 hours PRN for pain-will taper off as pain improves  Continue ferrous sulfate for anemia. Check CBC in AM to monitor Hgb/Hct Treatment team working on  disposition planning options. Nanci Pina, [name] 04/09/2018, 1:34 PM   Patient ID: Theresa Crawford, female   DOB: 27-Sep-1980, 37 y.o.   MRN: [id number] .Agree with NP Progress Note

## 2018-04-09 NOTE — [facility] Group Notes (Signed)
[facility] Group Notes:  (Nursing/MHT/Case Management/Adjunct)  Date:  04/09/2018  Time:  1530  Type of Therapy:  Psychoeducational Skills  Participation Level:  Active  Participation Quality:  Appropriate and Attentive  Affect:  Appropriate  Cognitive:  Alert and Appropriate  Insight:  Appropriate  Engagement in Group:  Supportive  Modes of Intervention:  Support  Summary of Progress/Problems: Patient appeared engaged during group.  Cranford MonBeaudry, Eural Holzschuh Evans 04/09/2018, 4:24 PM

## 2018-04-09 NOTE — [facility] Group Notes (Signed)
LCSW Group Therapy Note  04/09/2018    10:00 - 10:50 AM               Type of Therapy and Topic:  Group Therapy: Understanding Depression / Exploring Positive Ways to Cope  Participation Level:  Active   Description of Group:   In this group session, patients learned how to define depression as well as recognize the difference between depression and sadness. Patients talked about how typically people respond with one of the 3 E's - escape, explode or express. Patients defined depression, analyzed the symptoms of their depression and how depression affects their life currently. Patients were asked to scale their depression. Patients explored triggers and current coping techniques for depression. Patient's talked about self sabotage briefly. Patients were introduced to several positive coping skills as well as emotional regulation skills.Patients talked about the importance of support with depression and different types of supports. Patients were asked to complete an assessment worksheet analyzing their current supports and those that they wish to have in the future.   Therapeutic Goals: 1. Patients will explore depression - the definition, symptoms, triggers,and how it affects their life.  2. Patients will discuss the importance of support with an emphasis on therapy and medications.  3. Patients will learn three emotional regulation skills (opposite action, check the facts, PLEASE) 4. Patients were encouraged to close on a positive note, sharing their happy place with the group - another coping technique.  Summary of Patient Progress:  Patient was engaged and participated in the group therapy session. Patient was able to provide insight and took notes of things covered in group today. Patient shared she plans to try deep breathing and self love techniques discussed. Patient reports wanting to grow her family supports and attend more group therapy. Patient reports her happy place is baking and  singing.   Therapeutic Modalities:   Cognitive Behavioral Therapy Motivational Interviewing  Brief Therapy  Shellia CleverlyStephanie N Cheyane Ayon, LCSW  04/09/2018

## 2018-04-09 NOTE — Progress Notes (Signed)
D:  Patient's affect is brighter today; she appears less depressed.  She denies any thoughts of self harm.  She rates her depression and hopelessness as a 5; anxiety as an 8.  Her goal today is to "concentrate on me and stop worrying about what's going on that I can't control"  Patient is sleeping and eating well; her concentration is good; energy level is normal.  She is observed interacting with her peers; visible in the milieu.  A: Continue to monitor medication management and MD orders.  Safety checks completed every 15 minutes per protocol.  Offer support and encouragement as needed.  R: Patient is receptive to staff; her behavior is appropriate.

## 2018-04-10 ENCOUNTER — Other Ambulatory Visit: Payer: Medicaid Other

## 2018-04-10 MED ORDER — MIRTAZAPINE 15 MG PO TABS
15.0000 mg | ORAL_TABLET | Freq: Once | ORAL | Status: AC
  Administered 2018-04-10: 15 mg via ORAL
  Filled 2018-04-10 (×2): qty 1

## 2018-04-10 MED ORDER — HYDROCODONE-ACETAMINOPHEN 5-325 MG PO TABS
1.0000 | ORAL_TABLET | Freq: Two times a day (BID) | ORAL | Status: DC | PRN
  Administered 2018-04-10 – 2018-04-14 (×6): 1 via ORAL
  Filled 2018-04-10 (×6): qty 1

## 2018-04-10 NOTE — Progress Notes (Signed)
Patient ID: Theresa Crawford, female   DOB: Oct 20, 1980, 37 y.o.   MRN: [id number]   D: Patient denies SI/HI and auditory and visual hallucinations. Patient has a depressed mood and anxiety. She reports that she slept well with the aid of Remeron. Visible on the unit.  A: Patient given emotional support from RN. Patient given medications per MD orders. Patient encouraged to attend groups and unit activities. Patient encouraged to come to staff with any questions or concerns.  R: Patient remains cooperative and appropriate. Will continue to monitor patient for safety.

## 2018-04-10 NOTE — Progress Notes (Addendum)
[facility] MD Progress Note  04/10/2018 1:20 PM Theresa Crawford  MRN:  [id number] Subjective: Patient reports partial improvement but remains anxious, ruminative about her stressors, mainly medical issues . Denies suicidal ideations. Does endorse her mood has improved since admission. Denies medication side effects at this time.   Objective: I have discussed case with treatment team and have met with patient. 37 year old female, history of cardiac illness (mitral valve stenosis), reports she is scheduled for upcoming elective surgery for mitral valve replacement, date is not yet set.  Reported  worsening depression, anxiety, suicidal thoughts leading to this admission. Also describes history of trichotillomania ( mainly pulling hair out of temporal areas of head) associated with depression and anxiety. She endorses partial improvement compared to how she felt at admission. She does present with a more reactive, brighter affect. Affect remains labile, however, and she was briefly tearful during session when discussing her stressors and fears . Remains anxious. Affect responds well and improves with reassurance, support, validation. Patient tends to ruminate about her upcoming elective heart surgery - reports it will be a mitral valve replacement for valve stenosis. She had a prior transthoracic cardiac surgery in the past for valve repair. States " that one was bad , this one is supposed to be worse", referring to issues such as surgical risk, recovery phase, potential for cheloid scarring on chest . She states " I am so afraid I might die during the surgery". Responds partially to reassurance, support, and affect does improve gradually during session. She has been visible on unit, going to groups, pleasant on approach, no disruptive or agitated behaviors . Denies medication side effects. Labs reviewed- CBC- Hgb/Hct stable  Principal Problem: Depression/anxiety  Diagnosis:   Patient Active Problem List    Diagnosis Date Noted  . MDD (major depressive disorder), recurrent episode ([place]) [F33.9] 04/05/2018  . S/P tooth extraction [K08.409] 04/03/2018  . Suicidal ideation [R45.851] 04/03/2018  . MDD (major depressive disorder) [F32.9]   . Chronic diastolic congestive heart failure ([place]) [I50.32]   . Tricuspid regurgitation [I07.1]   . Hypomagnesemia [E83.42] 03/07/2018  . Tobacco abuse [Z72.0]   . Dyspnea [R06.00] 01/11/2018  . Prolonged QT interval [R94.31] 09/10/2017  . Normochromic normocytic anemia [D64.9]   . Pulmonary edema [J81.1] 07/05/2017  . Hypokalemia [E87.6] 06/12/2017  . Pulmonary hypertension ([place]) [I27.20] 06/12/2017  . Flash pulmonary edema ([place]) [J81.0] 06/11/2017  . Depression with anxiety [F41.8] 06/11/2017  . Recurrent mitral valve stenosis and regurgitation s/p mitral valve repair [I05.2]   . Acute pulmonary edema ([place]) [J81.0] 10/10/2016  . CAP (community acquired pneumonia) [J18.9] 05/25/2016  . Leukocytosis [D72.829] 05/24/2016  . Acute respiratory failure with hypoxia ([place]) [J96.01] 05/23/2016  . Acute on chronic diastolic CHF (congestive heart failure) ([place]) [I50.33] 05/23/2016  . Cough with hemoptysis [R04.2] 05/23/2016  . Postpartum complication pericarditis in 2009 with eventual needing MVR in 2015 [O90.89] 05/23/2016  . Anemia, iron deficiency [D50.9] 05/23/2016  . Hypertension [I10]   . SOB (shortness of breath) [R06.02]   . Respiratory distress [R06.03] 02/16/2016  . Essential hypertension [I10] 02/16/2016  . S/P mitral valve repair [Z98.890] 03/27/2013   Total Time spent with patient:20 minutes   Past Psychiatric History:   Past Medical History:  Past Medical History:  Diagnosis Date  . Anxiety   . Arthritis    "lower back" (04/04/2018)  . Chronic bronchitis ([place])   . Chronic diastolic CHF (congestive heart failure) ([place])   . Chronic lower back pain   .  DDD (degenerative disc disease), lumbar   . Depression   . GERD (gastroesophageal reflux  disease)   . Headache    "weekly" (04/04/2018)  . Heart murmur   . Hypertension   . Mitral valve disease   . Normocytic anemia   . Obesity   . Palpitations   . Pericarditis   . Premature atrial contractions   . Pulmonary hypertension ([place])   . PVC's (premature ventricular contractions)    a. h/o palpitations with event monitor in 03/2017 showing NSR with PACs/PVCs.  . Recurrent mitral valve stenosis and regurgitation s/p mitral valve repair   . S/P mitral valve repair 03/27/2013   Dr. Fransico Michael - [place], [place] - complex valvuloplasty including resuspension of entire posterior leaflet using Gore-tex neochords and 30 mm Edwards Physio ring annuloplasty  . Sarcoidosis   . Tobacco abuse   . Tricuspid regurgitation     Past Surgical History:  Procedure Laterality Date  . ABDOMINAL HERNIA REPAIR  ~ 2011  . ABDOMINAL HYSTERECTOMY  2011   "partial"; PID w/problem with fallopian tubes, had left fallopian and left ovary removed, pt still having periods  . CARDIAC CATHETERIZATION  2014  . CESAREAN SECTION  2004; 2009  . HERNIA REPAIR    . MITRAL VALVE REPAIR  03/27/2013   Dr. Fransico Michael - [place], [place]. - complex valvuloplasty including resuspension of posterior leaflet with 30 mm CE Physio ring annuloplasty  . MULTIPLE EXTRACTIONS WITH ALVEOLOPLASTY N/A 04/03/2018   Procedure: Extraction of tooth #30 with alveoloplasty and gross debridement of remaining teeth;  Surgeon: Lenn Cal, DDS;  Location: [facility];  Service: Oral Surgery;  Laterality: N/A;  . PARTIAL HYSTERECTOMY  2011   PID w/problem with fallopian tubes, had left fallopian and left ovary removed, pt still having periods  . TEE WITHOUT CARDIOVERSION N/A 05/26/2016   Procedure: TRANSESOPHAGEAL ECHOCARDIOGRAM (TEE);  Surgeon: Herminio Commons, MD;  [facility];  Service: Cardiovascular;  Laterality: N/A;  . TEE WITHOUT CARDIOVERSION N/A 01/25/2018   Procedure: TRANSESOPHAGEAL ECHOCARDIOGRAM (TEE) WITH  PROPOFOL;  Surgeon: Satira Sark, MD;  Location[facility];  Service: Cardiovascular;  Laterality: N/A;   Family History:  Family History  Problem Relation Age of Onset  . Heart failure Father        just received LVAD  . Heart disease Paternal Grandfather        stent placement   Family Psychiatric  History:  Social History:  Social History   Substance and Sexual Activity  Alcohol Use Yes   Comment: 04/04/2018 "1-2 beers/month; if that"     Social History   Substance and Sexual Activity  Drug Use Not Currently    Social History   Socioeconomic History  . Marital status: Single    Spouse name: Not on file  . Number of children: 2  . Years of education: Not on file  . Highest education level: Not on file  Occupational History  . Not on file  Social Needs  . Financial resource strain: Not on file  . Food insecurity:    Worry: Not on file    Inability: Not on file  . Transportation needs:    Medical: Not on file    Non-medical: Not on file  Tobacco Use  . Smoking status: Current Some Day Smoker    Packs/day: 0.50    Years: 24.00    Pack years: 12.00    Types: Cigarettes    Start date: 11/03/1999  Last attempt to quit: 03/20/2018    Years since quitting: 0.0  . Smokeless tobacco: Never Used  Substance and Sexual Activity  . Alcohol use: Yes    Comment: 04/04/2018 "1-2 beers/month; if that"  . Drug use: Not Currently  . Sexual activity: Not Currently  Lifestyle  . Physical activity:    Days per week: Not on file    Minutes per session: Not on file  . Stress: Not on file  Relationships  . Social connections:    Talks on phone: Not on file    Gets together: Not on file    Attends religious service: Not on file    Active member of club or organization: Not on file    Attends meetings of clubs or organizations: Not on file    Relationship status: Not on file  Other Topics Concern  . Not on file  Social History Narrative  . Not on file    Additional Social History:   Sleep: Fair but improving   Appetite:  improving  Current Medications: Current Facility-Administered Medications  Medication Dose Route Frequency Provider Last Rate Last Dose  . acidophilus (RISAQUAD) capsule 1 capsule  1 capsule Oral Daily Rankin, Shuvon B, NP   1 capsule at 04/10/18 0801  . albuterol (PROVENTIL) (2.5 MG/3ML) 0.083% nebulizer solution 2.5 mg  2.5 mg Nebulization Q6H PRN Rankin, Shuvon B, NP      . bisacodyl (DULCOLAX) suppository 10 mg  10 mg Rectal Daily PRN Rankin, Shuvon B, NP      . docusate sodium (COLACE) capsule 100 mg  100 mg Oral Daily PRN Rankin, Shuvon B, NP      . ferrous sulfate tablet 325 mg  325 mg Oral Q breakfast Rankin, Shuvon B, NP   325 mg at 04/10/18 0801  . HYDROcodone-acetaminophen (NORCO/VICODIN) 5-325 MG per tablet 1 tablet  1 tablet Oral Q8H PRN Aeson Sawyers, Myer Peer, MD   1 tablet at 04/07/18 1956  . LORazepam (ATIVAN) tablet 0.5 mg  0.5 mg Oral Q6H PRN Alishia Lebo, Myer Peer, MD   0.5 mg at 04/09/18 2139  . nicotine (NICODERM CQ - dosed in mg/24 hours) patch 14 mg  14 mg Transdermal Daily Winola Drum, Myer Peer, MD   14 mg at 04/10/18 0802  . QUEtiapine (SEROQUEL) tablet 50 mg  50 mg Oral QHS Shakoya Gilmore, Myer Peer, MD   50 mg at 04/09/18 2139  . senna-docusate (Senokot-S) tablet 1 tablet  1 tablet Oral QHS PRN Rankin, Shuvon B, NP      . sertraline (ZOLOFT) tablet 50 mg  50 mg Oral Daily Kassidi Elza, Myer Peer, MD   50 mg at 04/10/18 0801  . torsemide (DEMADEX) tablet 20 mg  20 mg Oral BID Rankin, Shuvon B, NP   20 mg at 04/10/18 0801    Lab Results:  Results for orders placed or performed during the hospital encounter of 04/05/18 (from the past 48 hour(s))  CBC with Differential/Platelet     Status: Abnormal   Collection Time: 04/09/18  6:37 AM  Result Value Ref Range   WBC 11.2 (H) 4.0 - 10.5 K/uL   RBC 3.82 (L) 3.87 - 5.11 MIL/uL   Hemoglobin 9.3 (L) 12.0 - 15.0 g/dL   HCT 29.4 (L) 36.0 - 46.0 %   MCV 77.0 (L) 78.0 - 100.0 fL    MCH 24.3 (L) 26.0 - 34.0 pg   MCHC 31.6 30.0 - 36.0 g/dL   RDW 19.9 (H) 11.5 - 15.5 %   Platelets  470 (H) 150 - 400 K/uL   Neutrophils Relative % 77 %   Neutro Abs 8.7 (H) 1.7 - 7.7 K/uL   Lymphocytes Relative 17 %   Lymphs Abs 1.8 0.7 - 4.0 K/uL   Monocytes Relative 5 %   Monocytes Absolute 0.5 0.1 - 1.0 K/uL   Eosinophils Relative 1 %   Eosinophils Absolute 0.1 0.0 - 0.7 K/uL   Basophils Relative 0 %   Basophils Absolute 0.0 0.0 - 0.1 K/uL    Comment: Performed at [facility], [place] [address], [place], [place] [address]    Blood Alcohol level:  No results found for: [facility]  Metabolic Disorder Labs: No results found for: HGBA1C, MPG No results found for: PROLACTIN No results found for: CHOL, TRIG, HDL, CHOLHDL, VLDL, LDLCALC  Physical Findings: AIMS: Facial and Oral Movements Muscles of Facial Expression: None, normal Lips and Perioral Area: None, normal Jaw: None, normal Tongue: None, normal,Extremity Movements Upper (arms, wrists, hands, fingers): None, normal Lower (legs, knees, ankles, toes): None, normal, Trunk Movements Neck, shoulders, hips: None, normal, Overall Severity Severity of abnormal movements (highest score from questions above): None, normal Incapacitation due to abnormal movements: None, normal Patient's awareness of abnormal movements (rate only patient's report): No Awareness, Dental Status Current problems with teeth and/or dentures?: No Does patient usually wear dentures?: No  CIWA:    COWS:     Musculoskeletal: Strength & Muscle Tone: within normal limits Gait & Station: normal Patient leans: N/A  Psychiatric Specialty Exam: Physical Exam  ROS no fever, no chills, denies chest pain. Reports improved dental pain/discomfort ( following recent dental procedure)   Blood pressure 136/61, pulse 88, temperature 97.6 F (36.4 C), resp. rate 16, height 5' 4"  (1.626 m), weight 100.7 kg (222 lb), last menstrual period 03/15/2018.Body  mass index is 38.11 kg/m.  General Appearance: Well Groomed  Eye Contact:  Good  Speech:  Normal Rate  Volume:  Normal  Mood:  improving mood, less depressed, remains anxious   Affect:  anxious, improves during session, does smile/laugh at times appropriately   Thought Process:  Linear and Descriptions of Associations: Intact  Orientation:  Full (Time, Place, and Person)  Thought Content:  no hallucinations, no delusions, (+) ruminations about potential upcoming mitral valve replacement surgery  Suicidal Thoughts:  No currently denies suicidal ideations, denies self-injurious ideations, contracts for safety on the unit, denies homicidal or violent ideations  Homicidal Thoughts:  No  Memory:  Recent and remote grossly intact  Judgement:  Other:  improving   Insight:  improving   Psychomotor Activity:  Normal  Concentration:  Concentration: Good and Attention Span: Good  Recall:  Good  Fund of Knowledge:  Good  Language:  Good  Akathisia:  Negative  Handed:  Right  AIMS (if indicated):     Assets:  Desire for Improvement Resilience  ADL's:  Intact  Cognition:  WNL  Sleep:  Number of Hours: 5.5   Assessment-37 year old female, history of mitral valve stenosis who is scheduled for an upcoming valve replacement surgery.  Recent dental procedure (extraction, debridement), during which she reported worsening depression and suicidal ideations. Mood has improved and she presents with improving mood and range of affect. Less somatically focused and today not reporting pain or concerns about recent oral /dental procedure. She does have significant anxiety related to upcoming elective cardiac surgery and acknowledges distressing  ruminations that she might die during procedure. Anxiety responds to support, encouragement . Sleep partially improved .  Treatment Plan Summary: Daily contact with patient to assess and evaluate symptoms and progress in treatment, Medication management, Plan  inpatient treatment and medications as below  Treatment Plan reviewed today 7/29  Encourage group and milieu participation to work on coping skills and symptom reduction Continue Ativan 0.5 mg every 6 hours PRN for anxiety or insomnia Continue  Seroquel to 50  mg nightly for insomnia, mood disorder Continue Zoloft 50 mg daily for depression, anxiety Decrease  Hydrocodone 5 mgrs every 12  hours PRN for pain-will taper off as pain improves  Continue ferrous sulfate for anemia.  Treatment team working on disposition planning options. Jenne Campus, MD 04/10/2018, 1:20 PM   Patient ID: Theresa Crawford, female   DOB: 06-Apr-1981, 37 y.o.   MRN: [id number]

## 2018-04-11 DIAGNOSIS — F411 Generalized anxiety disorder: Secondary | ICD-10-CM

## 2018-04-11 DIAGNOSIS — F332 Major depressive disorder, recurrent severe without psychotic features: Principal | ICD-10-CM

## 2018-04-11 DIAGNOSIS — F17210 Nicotine dependence, cigarettes, uncomplicated: Secondary | ICD-10-CM

## 2018-04-11 MED ORDER — EPINEPHRINE 0.3 MG/0.3ML IJ SOAJ
INTRAMUSCULAR | Status: AC
  Filled 2018-04-11: qty 0.3

## 2018-04-11 MED ORDER — SERTRALINE HCL 100 MG PO TABS
100.0000 mg | ORAL_TABLET | Freq: Every day | ORAL | Status: DC
  Administered 2018-04-12 – 2018-04-15 (×4): 100 mg via ORAL
  Filled 2018-04-11 (×6): qty 1

## 2018-04-11 MED ORDER — EPINEPHRINE 0.3 MG/0.3ML IJ SOAJ: 0.3000 mg | INTRAMUSCULAR | Status: DC | PRN

## 2018-04-11 MED ORDER — SERTRALINE HCL 50 MG PO TABS
50.0000 mg | ORAL_TABLET | Freq: Once | ORAL | Status: AC
  Administered 2018-04-11: 50 mg via ORAL
  Filled 2018-04-11 (×2): qty 1

## 2018-04-11 MED ORDER — NICOTINE POLACRILEX 2 MG MT GUM
2.0000 mg | CHEWING_GUM | OROMUCOSAL | Status: DC | PRN
  Administered 2018-04-11: 2 mg via ORAL
  Filled 2018-04-11: qty 1

## 2018-04-11 NOTE — Progress Notes (Signed)
[facility]  04/11/2018 1:20 PM Theresa DaneLatasha Y Crawford  MRN:  [id number] Subjective: Patient is seen and examined.  Patient is a 37 year old female with a past psychiatric history significant for major depression, recurrent as well as a generalized anxiety disorder.  She is seen in follow-up.  She has a significant history of mitral valve stenosis.  She is apparently scheduled for elective surgery for mitral valve replacement.  She was admitted due to worsening depression, anxiety and suicidal thoughts.  She gets very anxious thinking about her heart issues.  She also describes a history of trichotillomania associated with her depression and anxiety.  She stated that she was more anxious today, and begun to pull out her hair in her eyebrows again.  She stated she did not think the medications were helping.  We discussed the timeframe by which Zoloft works, and how it would benefit her.  She agreed to an increase in the Zoloft to 100 mg p.o. daily.  We also discussed the fact that Zoloft is very safe and congestive heart failure. Principal Problem: <principal problem not specified> Diagnosis:   Patient Active Problem List   Diagnosis Date Noted  . MDD (major depressive disorder), recurrent episode (HCC) [F33.9] 04/05/2018  . S/P tooth extraction [K08.409] 04/03/2018  . Suicidal ideation [R45.851] 04/03/2018  . MDD (major depressive disorder) [F32.9]   . Chronic diastolic congestive heart failure (HCC) [I50.32]   . Tricuspid regurgitation [I07.1]   . Hypomagnesemia [E83.42] 03/07/2018  . Tobacco abuse [Z72.0]   . Dyspnea [R06.00] 01/11/2018  . Prolonged QT interval [R94.31] 09/10/2017  . Normochromic normocytic anemia [D64.9]   . Pulmonary edema [J81.1] 07/05/2017  . Hypokalemia [E87.6] 06/12/2017  . Pulmonary hypertension (HCC) [I27.20] 06/12/2017  . Flash pulmonary edema (HCC) [J81.0] 06/11/2017  . Depression with anxiety [F41.8] 06/11/2017  . Recurrent mitral valve stenosis and  regurgitation s/p mitral valve repair [I05.2]   . Acute pulmonary edema (HCC) [J81.0] 10/10/2016  . CAP (community acquired pneumonia) [J18.9] 05/25/2016  . Leukocytosis [D72.829] 05/24/2016  . Acute respiratory failure with hypoxia (HCC) [J96.01] 05/23/2016  . Acute on chronic diastolic CHF (congestive heart failure) (HCC) [I50.33] 05/23/2016  . Cough with hemoptysis [R04.2] 05/23/2016  . Postpartum complication pericarditis in 2009 with eventual needing MVR in 2015 [O90.89] 05/23/2016  . Anemia, iron deficiency [D50.9] 05/23/2016  . Hypertension [I10]   . SOB (shortness of breath) [R06.02]   . Respiratory distress [R06.03] 02/16/2016  . Essential hypertension [I10] 02/16/2016  . S/P mitral valve repair [Z98.890] 03/27/2013   Total Time spent with patient: 15 minutes  Past Psychiatric History: See admission H&P  Past Medical History:  Past Medical History:  Diagnosis Date  . Anxiety   . Arthritis    "lower back" (04/04/2018)  . Chronic bronchitis (HCC)   . Chronic diastolic CHF (congestive heart failure) (HCC)   . Chronic lower back pain   . DDD (degenerative disc disease), lumbar   . Depression   . GERD (gastroesophageal reflux disease)   . Headache    "weekly" (04/04/2018)  . Heart murmur   . Hypertension   . Mitral valve disease   . Normocytic anemia   . Obesity   . Palpitations   . Pericarditis   . Premature atrial contractions   . Pulmonary hypertension (HCC)   . PVC's (premature ventricular contractions)    a. h/o palpitations with event monitor in 03/2017 showing NSR with PACs/PVCs.  . Recurrent mitral valve stenosis and regurgitation s/p mitral valve repair   .  S/P mitral valve repair 03/27/2013   Dr. Darcus Austin - [place], [place] - complex valvuloplasty including resuspension of entire posterior leaflet using Gore-tex neochords and 30 mm Edwards Physio ring annuloplasty  . Sarcoidosis   . Tobacco abuse   . Tricuspid regurgitation     Past Surgical History:   Procedure Laterality Date  . ABDOMINAL HERNIA REPAIR  ~ 2011  . ABDOMINAL HYSTERECTOMY  2011   "partial"; PID w/problem with fallopian tubes, had left fallopian and left ovary removed, pt still having periods  . CARDIAC CATHETERIZATION  2014  . CESAREAN SECTION  2004; 2009  . HERNIA REPAIR    . MITRAL VALVE REPAIR  03/27/2013   Dr. Darcus Austin - [place], [place]. - complex valvuloplasty including resuspension of posterior leaflet with 30 mm CE Physio ring annuloplasty  . MULTIPLE EXTRACTIONS WITH ALVEOLOPLASTY N/A 04/03/2018   Procedure: Extraction of tooth #30 with alveoloplasty and gross debridement of remaining teeth;  Surgeon: Charlynne Pander, DDS;  Location: [facility] OR;  Service: Oral Surgery;  Laterality: N/A;  . PARTIAL HYSTERECTOMY  2011   PID w/problem with fallopian tubes, had left fallopian and left ovary removed, pt still having periods  . TEE WITHOUT CARDIOVERSION N/A 05/26/2016   Procedure: TRANSESOPHAGEAL ECHOCARDIOGRAM (TEE);  Surgeon: [name], MD;  [facility];  Service: Cardiovascular;  Laterality: N/A;  . TEE WITHOUT CARDIOVERSION N/A 01/25/2018   Procedure: TRANSESOPHAGEAL ECHOCARDIOGRAM (TEE) WITH PROPOFOL;  Surgeon: Jonelle Sidle, MD;  Location: [facility];  Service: Cardiovascular;  Laterality: N/A;   Family History:  Family History  Problem Relation Age of Onset  . Heart failure Father        just received LVAD  . Heart disease Paternal Grandfather        stent placement   Family Psychiatric  History: See admission H&P Social History:  Social History   Substance and Sexual Activity  Alcohol Use Yes   Comment: 04/04/2018 "1-2 beers/month; if that"     Social History   Substance and Sexual Activity  Drug Use Not Currently    Social History   Socioeconomic History  . Marital status: Single    Spouse name: Not on file  . Number of children: 2  . Years of education: Not on file  . Highest education level: Not on file   Occupational History  . Not on file  Social Needs  . Financial resource strain: Not on file  . Food insecurity:    Worry: Not on file    Inability: Not on file  . Transportation needs:    Medical: Not on file    Non-medical: Not on file  Tobacco Use  . Smoking status: Current Some Day Smoker    Packs/day: 0.50    Years: 24.00    Pack years: 12.00    Types: Cigarettes    Start date: 11/03/1999    Last attempt to quit: 03/20/2018    Years since quitting: 0.0  . Smokeless tobacco: Never Used  Substance and Sexual Activity  . Alcohol use: Yes    Comment: 04/04/2018 "1-2 beers/month; if that"  . Drug use: Not Currently  . Sexual activity: Not Currently  Lifestyle  . Physical activity:    Days per week: Not on file    Minutes per session: Not on file  . Stress: Not on file  Relationships  . Social connections:    Talks on phone: Not on file    Gets together: Not on file  Attends religious service: Not on file    Active member of club or organization: Not on file    Attends meetings of clubs or organizations: Not on file    Relationship status: Not on file  Other Topics Concern  . Not on file  Social History Narrative  . Not on file   Additional Social History:                         Sleep: Fair  Appetite:  Fair  Current Medications: Current Facility-Administered Medications  Medication Dose Route Frequency Provider Last Rate Last Dose  . acidophilus (RISAQUAD) capsule 1 capsule  1 capsule Oral Daily Rankin, Shuvon B, NP   1 capsule at 04/11/18 0751  . albuterol (PROVENTIL) (2.5 MG/3ML) 0.083% nebulizer solution 2.5 mg  2.5 mg Nebulization Q6H PRN Rankin, Shuvon B, NP      . bisacodyl (DULCOLAX) suppository 10 mg  10 mg Rectal Daily PRN Rankin, Shuvon B, NP      . docusate sodium (COLACE) capsule 100 mg  100 mg Oral Daily PRN Rankin, Shuvon B, NP      . EPINEPHrine (EPI-PEN) 0.3 mg/0.3 mL injection           . EPINEPHrine (EPI-PEN) injection 0.3 mg  0.3  mg Intramuscular Continuous PRN Nwoko, Agnes I, NP      . ferrous sulfate tablet 325 mg  325 mg Oral Q breakfast Rankin, Shuvon B, NP   325 mg at 04/11/18 0751  . HYDROcodone-acetaminophen (NORCO/VICODIN) 5-325 MG per tablet 1 tablet  1 tablet Oral Q12H PRN Cobos, Rockey Situ, MD   1 tablet at 04/11/18 1119  . LORazepam (ATIVAN) tablet 0.5 mg  0.5 mg Oral Q6H PRN Cobos, Rockey Situ, MD   0.5 mg at 04/11/18 0006  . nicotine (NICODERM CQ - dosed in mg/24 hours) patch 14 mg  14 mg Transdermal Daily Cobos, Rockey Situ, MD   14 mg at 04/11/18 0751  . QUEtiapine (SEROQUEL) tablet 50 mg  50 mg Oral QHS Cobos, Rockey Situ, MD   50 mg at 04/10/18 2217  . senna-docusate (Senokot-S) tablet 1 tablet  1 tablet Oral QHS PRN Rankin, Shuvon B, NP      . [START ON 04/12/2018] sertraline (ZOLOFT) tablet 100 mg  100 mg Oral Daily Antonieta Pert, MD      . torsemide [facility]) tablet 20 mg  20 mg Oral BID Rankin, Shuvon B, NP   20 mg at 04/11/18 0751    Lab Results: No results found for this or any previous visit (from the past 48 hour(s)).  Blood Alcohol level:  No results found for: [facility]  Metabolic Disorder Labs: No results found for: HGBA1C, MPG No results found for: PROLACTIN No results found for: CHOL, TRIG, HDL, CHOLHDL, VLDL, LDLCALC  Physical Findings: AIMS: Facial and Oral Movements Muscles of Facial Expression: None, normal Lips and Perioral Area: None, normal Jaw: None, normal Tongue: None, normal,Extremity Movements Upper (arms, wrists, hands, fingers): None, normal Lower (legs, knees, ankles, toes): None, normal, Trunk Movements Neck, shoulders, hips: None, normal, Overall Severity Severity of abnormal movements (highest score from questions above): None, normal Incapacitation due to abnormal movements: None, normal Patient's awareness of abnormal movements (rate only patient's report): No Awareness, Dental Status Current problems with teeth and/or dentures?: No Does patient usually wear  dentures?: No  CIWA:    COWS:     Musculoskeletal: Strength & Muscle Tone: within normal limits Gait &  Station: normal Patient leans: N/A  Psychiatric Specialty Exam: Physical Exam  Nursing note and vitals reviewed. Constitutional: She is oriented to person, place, and time. She appears well-developed and well-nourished.  HENT:  Head: Normocephalic and atraumatic.  Respiratory: Effort normal.  Neurological: She is alert and oriented to person, place, and time.    ROS  Blood pressure 97/79, pulse 90, temperature 97.9 F (36.6 C), temperature source Oral, resp. rate 20, height 5\' 4"  (1.626 m), weight 100.7 kg (222 lb), last menstrual period 03/15/2018.Body mass index is 38.11 kg/m.  General Appearance: Casual  Eye Contact:  Fair  Speech:  Normal Rate  Volume:  Decreased  Mood:  Depressed  Affect:  Congruent  Thought Process:  Coherent  Orientation:  Full (Time, Place, and Person)  Thought Content:  Logical  Suicidal Thoughts:  No  Homicidal Thoughts:  No  Memory:  Immediate;   Fair Recent;   Fair Remote;   Fair  Judgement:  Impaired  Insight:  Lacking  Psychomotor Activity:  Increased  Concentration:  Concentration: Fair and Attention Span: Fair  Recall:  [employer] of Knowledge:  Fair  Language:  Fair  Akathisia:  Negative  Handed:  Right  AIMS (if indicated):     Assets:  Communication Skills Desire for Improvement Housing Resilience Social Support  ADL's:  Intact  Cognition:  WNL  Sleep:  Number of Hours: 5     Treatment Plan Summary: Daily contact with patient to assess and evaluate symptoms and progress in treatment, Medication management and Plan : Patient seen and examined.  Patient is a 37 year old female with the above-stated past psychiatric history is seen in follow-up.  She still very anxious, and still having trichotillomania symptoms.  We discussed increasing her Zoloft.  I am going to increase her Zoloft 200 mg p.o. daily for depression anxiety.   We will continue Ativan 0.5 mg p.o. every 6 hours as needed anxiety or insomnia, continue Seroquel 50 mg p.o. nightly for insomnia as well as mood stability, and continue her hydrocodone for pain as needed.  Hopefully she will continue to improve.  Antonieta Pert, MD 04/11/2018, 1:20 PM

## 2018-04-11 NOTE — Progress Notes (Signed)
Recreation Therapy Notes  Animal-Assisted Activity (AAA) Program Checklist/Progress Notes Patient Eligibility Criteria Checklist & Daily Group note for Rec Tx Intervention  Date: 7.30.19 Time: 1430 [address]   AAA/T Program Assumption of Risk Form signed by Engineer, productionatient/ or Parent Legal Guardian YES   Patient is free of allergies or sever asthma YES  Patient reports no fear of animals  YES  Patient reports no history of cruelty to animals  YES  Patient understands his/her participation is voluntary  YES   Patient washes hands before animal contact  YES   Patient washes hands after animal contact  YES   Behavioral Response: Engaged  Education: Charity fundraiserHand Washing, Appropriate Animal Interaction   Education Outcome: Acknowledges understanding/In group clarification offered/Needs additional education.   Clinical Observations/Feedback: Pt attended and participated in group.    Theresa Crawford, Theresa Crawford         Theresa RancherLindsay, Theresa Crawford 04/11/2018 3:53 PM

## 2018-04-11 NOTE — [facility] Group Notes (Signed)
[facility] Group Notes:  (Nursing/MHT/Case Management/Adjunct)  Date:  04/11/2018  Time:  4:00 PM  Type of Therapy:  Nurse Education  Participation Level:  Active  Participation Quality:  Appropriate and Sharing  Affect:  Appropriate  Cognitive:  Appropriate  Insight:  Appropriate  Engagement in Group:  Developing/Improving and Engaged  Modes of Intervention:  Problem-solving and Socialization  Summary of Progress/Problems: Patient was attentive and supportive when other patients were sharing their stories.  Arvine Clayburn 04/11/2018, 4:57 PM 

## 2018-04-11 NOTE — [facility] Group Notes (Signed)
Pt attended spiritual care group on grief and loss facilitated by chaplain Theresa Crawford   Group opened with brief discussion and psycho-social ed around grief and loss in relationships and in relation to self - identifying life patterns, circumstances, changes that cause losses. Established group norm of speaking from own life experience. Group goal of establishing open and affirming space for members to share loss and experience with grief, normalize grief experience and provide psycho social education and grief support.  Group reflected on four tasks of grief.    Theresa Crawford was present throughout group.  Offered affirmation of other group members as they reflected on grief journey and normalized grief process.  Late in group, Theresa Crawford reflected on losing her grandmother and stated that the primary emotion she feels is anger.  Related that her grandmother had been a safe place for her, as she grew up in foster care.  Theresa Crawford expressed anger that her grandmother died stating this anger comes back when she is experiencing something significant in her life where she would have relied on her grandmother.  Group members offered support and normalization.  Theresa Crawford was able to orient to group and calm - stating that the feeling felt overwhelming in the moment, but every time she was able to express and find care for her feeling of anger, it feels more manageable.

## 2018-04-11 NOTE — Plan of Care (Signed)
  Problem: Education: Goal: Mental status will improve Outcome: Progressing   Problem: Activity: Goal: Interest or engagement in activities will improve Outcome: Progressing   

## 2018-04-11 NOTE — Progress Notes (Signed)
DAR NOTE: Patient presents with bright affect and calm mood.  Denies pain, auditory and visual hallucinations.  Reports good sleep, good appetite, normal energy, and good concentration. Been observed interacting with peers in the milieu. Rates depression at 5, hopelessness at 5, and anxiety at 6.  Maintained on routine safety checks.  Medications given as prescribed.  Support and encouragement offered as needed.  Attended group and participated.  States goal for today is "my anxiety and concentration."  Will continue to monitor.

## 2018-04-11 NOTE — Progress Notes (Signed)
Patient ID: Theresa Crawford, female   DOB: [birth year], 37 y.o.   MRN: [id number]  D: Patient pleasant on approach tonight. Interacting well with other peers. Reports one of the staff members made her irritable but she has been doing ok today. Complains mostly of anxiety and having some pain from oral surgery. Contracts for safety on the unit. A; Staff will monitor on q 15 minute checks, follow treatment plan, and give medications as ordered. R: Cooperative on the unit tonight

## 2018-04-12 ENCOUNTER — Telehealth: Payer: Self-pay | Admitting: Student

## 2018-04-12 ENCOUNTER — Telehealth: Payer: Self-pay | Admitting: *Deleted

## 2018-04-12 MED ORDER — TRAZODONE HCL 50 MG PO TABS
25.0000 mg | ORAL_TABLET | Freq: Every day | ORAL | Status: DC
  Administered 2018-04-12 – 2018-04-14 (×3): 25 mg via ORAL
  Filled 2018-04-12 (×5): qty 0.5

## 2018-04-12 MED ORDER — HYDROCORTISONE 1 % EX CREA
TOPICAL_CREAM | Freq: Two times a day (BID) | CUTANEOUS | Status: DC | PRN
  Administered 2018-04-12 – 2018-04-14 (×3): via TOPICAL
  Filled 2018-04-12: qty 28

## 2018-04-12 MED ORDER — HYDROXYZINE HCL 25 MG PO TABS
25.0000 mg | ORAL_TABLET | Freq: Three times a day (TID) | ORAL | Status: DC | PRN
  Administered 2018-04-12 – 2018-04-15 (×7): 25 mg via ORAL
  Filled 2018-04-12 (×6): qty 1

## 2018-04-12 NOTE — Plan of Care (Signed)
  Problem: Safety: Goal: Periods of time without injury will increase Outcome: Progressing-pt denies SI. No self injurious behaviors noted.   Problem: Coping: Goal: Will verbalize feelings Outcome: Progressing-pt expressed feeling agitated and aggravated this morning after an upsetting phone call this am.   Problem: Activity: Goal: Interest or engagement in activities will improve Outcome: Not Progressing-pt compliant with attending scheduled groups.

## 2018-04-12 NOTE — Progress Notes (Signed)
Pt presents with a sad affect and depressed mood. Pt noted to be sad and tearful on approach this morning. After pt was encouraged by another pt to tell this writer what was upsetting her, pt then began crying uncontrollably. Pt expressed that she had an upsetting conversation this morning with a friend. One on one support provided to pt.  Pt rated on her self inventory sheet: depression 6/10, anxiety 6/10 and hopelessness 4/10. Pt denies SI/HI. Pt reported feeling agitated and aggravated this morning. Pt requested Ativan for increased anxiety. Ativan administered to pt at her request.    Medications and orders reviewed with pt. Verbal support provided. Pt encouraged to attend groups. 15 minute checks performed for safety.  Pt compliant with tx plan.

## 2018-04-12 NOTE — Telephone Encounter (Signed)
[facility]: Thursday April 13, 2018 1:30 PM Ver[facility] at: 11:30 AM  [facility]. I called Behavioral Health, [phone], to determine if patient would be able to have catheterization tomorrow.  I was  told that Jolan, social worker was going to contact our office to discuss this, she was unavailable to speak with me at the time of this phone call, call back number provided.

## 2018-04-12 NOTE — Plan of Care (Addendum)
  Problem: Education: Goal: Knowledge of the prescribed therapeutic regimen will improve Outcome: Progressing-she takes her medications and attends groups.     Problem: Coping: Goal: Will verbalize feelings Outcome: Progressing-she is able to talk with staff when she is feeling upset.

## 2018-04-12 NOTE — Progress Notes (Signed)
D:  Theresa Crawford has been up and visible on the unit.  She was noted smiling and laughing in the day room.  She interacts well with staff and peers.  She denied SI/HI or A/V hallucinations.  She requested Ativan at hs along with her Seroquel.  She did question that she was taking Remeron and is wondering if the doctor would prescribe that again.  Informed her that I would make a note and pass it along to the next shift.  She asked when she was able to take another Vicodin and she was told after 11:19pm.  She is currently resting with her eyes closed and has not come up to request pain medication. A:  1:1 with RN for support and encouragement.  Medications as ordered.  Q 15 minute checks maintained for safety.  Encouraged participation in group and unit activities.   R:  Theresa Crawford remains safe on the unit.  We will continue to monitor the progress towards her goals.

## 2018-04-12 NOTE — Therapy (Signed)
Occupational Therapy Group Note  Date:  04/12/2018 Time:  2:31 PM  Group Topic/Focus:  Stress Management  Participation Level:  Active  Participation Quality:  Appropriate  Affect:  Blunted  Cognitive:  Appropriate  Insight: Improving  Engagement in Group:  Engaged  Modes of Intervention:  Activity, Discussion, Education and Socialization  Additional Comments:    S: "I hug my pillow and cry when I am stressed"  O: Stress management group completed to use as productive coping strategy, to help mitigate maladaptive coping to integrate in functional BADL/IADL when reintegrating into community. Education given on the definition of stress and its cognitive, behavioral, emotional, and physical effects on the body. Stress management tools worksheet completed to identify negative coping mechanisms and their short and long term effects vs positive coping mechanisms with demonstration. Coping strategies taught include: relaxation based- deep breathing, counting to 10, taking a 1 minute vacation, acceptance, stress balls, relaxation audio/video, visual/mental imagery. Positive mental attitude- gratitude, acceptance, cognitive reframing, positive self talk, anger management. Gratitude journaling handout and instruction also given. Adult coloring and relaxation tips worksheet given at end of session.   A: Pt presents to group with blunted affect, engaged and participatory throughout session. Stress management tools worksheet completed, pt stating she often supresses her emotions until she is uncontrollably crying when she is stressed. Pt states she is willing to try relaxation and positive mental attitude this date. Pt also enjoys coloring, dancing, and listening to music when stressed. Eager to accept handouts at end of session.  P: Pt provided with education on stress management activities to implement into daily routine. Handouts given to facilitate carryover when reintegrating into community  ?        Dalphine HandingKaylee Eisha Chatterjee, MSOT, OTR/L  [place] Yarethzy Croak 04/12/2018, 2:31 PM

## 2018-04-12 NOTE — Progress Notes (Signed)
CSW was informed that the [facility], Thursday, April 13, 2018 at 11:30am for a catheterization procedure.   Per Dr. Landry MellowGreg Clary, the patient will not be discharged before her scheduled appointment.  CSW contacted [facility] [phone]) to reschedule the patient's appointment on her behalf. CSW was forwared to the patient's cardiologist office. CSW left a voicemail and requested a call back to reschedule the appointment.   CSW will continue to follow.      [name], MSW, LCSWA Clinical Social Worker [facility]  Phone: [phone]

## 2018-04-12 NOTE — Progress Notes (Signed)
CSW spoke with Gunnar FusiKathryn Kemp, RN at [facility] regarding the patient's scheduled appointment for 04/13/18 at 11:30am.   Per Samara DeistKathryn, she will cancel the patient's appointment that was scheduled for Thursday, 04/13/18 at 11:30am. Samara DeistKathryn states that she will not reschedule the patient's appointment until [facility] is [facility]. Samara DeistKathryn states that the patient will be followed and once discharged from the hospital, will be contacted with a new appointment date and time.    CSW will inform the patient with this update.    Baldo DaubJolan Emmanuella Mirante, MSW, LCSWA Clinical Social Worker [facility]  Phone: [phone]

## 2018-04-12 NOTE — Telephone Encounter (Signed)
See pre-procedure phone encounter from today.  Per Baldo DaubJolan Williams, social worker for [facility], the heart catheterization has to be cancelled for tomorrow. After discussion, the cath will not be rescheduled at this time as they are not sure when she will be discharged.  Will await discharge then reschedule.   Cath for tomorrow cancelled.   To TCTS as FYI - the patient has appointment with Dr. Cornelius Moraswen 8/12.

## 2018-04-12 NOTE — Tx Team (Signed)
Interdisciplinary Treatment and Diagnostic Plan Update  04/12/2018 Time of Session: 1051 Theresa Crawford MRN: [id number]  Principal Diagnosis: <principal problem not specified>  Secondary Diagnoses: Active Problems:   MDD (major depressive disorder), recurrent episode (HCC)   Current Medications:  Current Facility-Administered Medications  Medication Dose Route Frequency Provider Last Rate Last Dose  . acidophilus (RISAQUAD) capsule 1 capsule  1 capsule Oral Daily Rankin, Shuvon B, NP   1 capsule at 04/12/18 0808  . albuterol (PROVENTIL) (2.5 MG/3ML) 0.083% nebulizer solution 2.5 mg  2.5 mg Nebulization Q6H PRN Rankin, Shuvon B, NP      . bisacodyl (DULCOLAX) suppository 10 mg  10 mg Rectal Daily PRN Rankin, Shuvon B, NP      . docusate sodium (COLACE) capsule 100 mg  100 mg Oral Daily PRN Rankin, Shuvon B, NP      . EPINEPHrine (EPI-PEN) injection 0.3 mg  0.3 mg Intramuscular Continuous PRN Nwoko, Agnes I, NP      . ferrous sulfate tablet 325 mg  325 mg Oral Q breakfast Rankin, Shuvon B, NP   325 mg at 04/12/18 0808  . HYDROcodone-acetaminophen (NORCO/VICODIN) 5-325 MG per tablet 1 tablet  1 tablet Oral Q12H PRN Cobos, Rockey Situ, MD   1 tablet at 04/11/18 2343  . LORazepam (ATIVAN) tablet 0.5 mg  0.5 mg Oral Q6H PRN Cobos, Rockey Situ, MD   0.5 mg at 04/12/18 0812  . nicotine (NICODERM CQ - dosed in mg/24 hours) patch 14 mg  14 mg Transdermal Daily Cobos, Rockey Situ, MD   14 mg at 04/12/18 0808  . nicotine polacrilex (NICORETTE) gum 2 mg  2 mg Oral PRN Antonieta Pert, MD   2 mg at 04/11/18 1805  . QUEtiapine (SEROQUEL) tablet 50 mg  50 mg Oral QHS Cobos, Rockey Situ, MD   50 mg at 04/11/18 2120  . senna-docusate (Senokot-S) tablet 1 tablet  1 tablet Oral QHS PRN Rankin, Shuvon B, NP      . sertraline (ZOLOFT) tablet 100 mg  100 mg Oral Daily Antonieta Pert, MD   100 mg at 04/12/18 4098  . torsemide (DEMADEX) tablet 20 mg  20 mg Oral BID Rankin, Shuvon B, NP   20 mg at 04/12/18 0808   . traZODone (DESYREL) tablet 25 mg  25 mg Oral QHS Antonieta Pert, MD       PTA Medications: Medications Prior to Admission  Medication Sig Dispense Refill Last Dose  . albuterol (PROVENTIL HFA;VENTOLIN HFA) 108 (90 Base) MCG/ACT inhaler Inhale 2 puffs into the lungs every 6 (six) hours as needed for wheezing or shortness of breath.   Past Week at Unknown time  . citalopram (CELEXA) 40 MG tablet Take 40 mg by mouth 2 (two) times daily.   Past Week at Unknown time  . clindamycin (CLEOCIN) 300 MG capsule Take 1 capsule by mouth every 8 hours for 5 days. 15 capsule 0   . docusate sodium (COLACE) 100 MG capsule Take 100 mg by mouth daily as needed for moderate constipation.    Past Week at Unknown time  . EPINEPHrine (EPIPEN 2-PAK) 0.3 mg/0.3 mL IJ SOAJ injection Inject 0.3 Units into the muscle once.    More than a month at Unknown time  . ferrous sulfate 325 (65 FE) MG tablet Take 1 tablet (325 mg total) by mouth 2 (two) times daily with a meal. (Patient taking differently: Take 325 mg by mouth daily with breakfast. )  3 Past Week at Unknown  time  . metoprolol succinate (TOPROL-XL) 25 MG 24 hr tablet TAKE 1 TABLET BY MOUTH DAILY (Patient taking differently: Take 37.5 mg by mouth once daily) 30 tablet 6 04/03/2018 at 0440  . potassium chloride SA (K-DUR,KLOR-CON) 20 MEQ tablet Take 2 tablets (40 mEq total) by mouth daily. (Patient taking differently: Take 40 mEq by mouth 2 (two) times daily. ) 60 tablet 2 04/02/2018 at Unknown time  . Probiotic Product (PROBIOTIC PO) Take 1 capsule by mouth daily.   Past Week at Unknown time  . QUEtiapine (SEROQUEL) 25 MG tablet Take 25 mg by mouth at bedtime.   Past Week at Unknown time  . torsemide (DEMADEX) 20 MG tablet Take 20 mg by mouth 2 (two) times daily.   04/02/2018 at Unknown time    Patient Stressors: Health problems Marital or family conflict  Patient Strengths: Ability for insight Average or above average intelligence Capable of independent  living General fund of knowledge  Treatment Modalities: Medication Management, Group therapy, Case management,  1 to 1 session with clinician, Psychoeducation, Recreational therapy.   Physician Treatment Plan for Primary Diagnosis: <principal problem not specified> Long Term Goal(s): Improvement in symptoms so as ready for discharge Improvement in symptoms so as ready for discharge   Short Term Goals: Ability to identify changes in lifestyle to reduce recurrence of condition will improve Ability to maintain clinical measurements within normal limits will improve Ability to identify changes in lifestyle to reduce recurrence of condition will improve Ability to verbalize feelings will improve Ability to disclose and discuss suicidal ideas Ability to demonstrate self-control will improve Ability to identify and develop effective coping behaviors will improve  Medication Management: Evaluate patient's response, side effects, and tolerance of medication regimen.  Therapeutic Interventions: 1 to 1 sessions, Unit Group sessions and Medication administration.  Evaluation of Outcomes: Progressing  Physician Treatment Plan for Secondary Diagnosis: Active Problems:   MDD (major depressive disorder), recurrent episode (HCC)  Long Term Goal(s): Improvement in symptoms so as ready for discharge Improvement in symptoms so as ready for discharge   Short Term Goals: Ability to identify changes in lifestyle to reduce recurrence of condition will improve Ability to maintain clinical measurements within normal limits will improve Ability to identify changes in lifestyle to reduce recurrence of condition will improve Ability to verbalize feelings will improve Ability to disclose and discuss suicidal ideas Ability to demonstrate self-control will improve Ability to identify and develop effective coping behaviors will improve     Medication Management: Evaluate patient's response, side effects, and  tolerance of medication regimen.  Therapeutic Interventions: 1 to 1 sessions, Unit Group sessions and Medication administration.  Evaluation of Outcomes: Progressing   RN Treatment Plan for Primary Diagnosis: <principal problem not specified> Long Term Goal(s): Knowledge of disease and therapeutic regimen to maintain health will improve  Short Term Goals: Ability to identify and develop effective coping behaviors will improve and Compliance with prescribed medications will improve  Medication Management: RN will administer medications as ordered by provider, will assess and evaluate patient's response and provide education to patient for prescribed medication. RN will report any adverse and/or side effects to prescribing provider.  Therapeutic Interventions: 1 on 1 counseling sessions, Psychoeducation, Medication administration, Evaluate responses to treatment, Monitor vital signs and CBGs as ordered, Perform/monitor CIWA, COWS, AIMS and Fall Risk screenings as ordered, Perform wound care treatments as ordered.  Evaluation of Outcomes: Progressing   LCSW Treatment Plan for Primary Diagnosis: <principal problem not specified> Long Term Goal(s): Safe  transition to appropriate next level of care at discharge, Engage patient in therapeutic group addressing interpersonal concerns.  Short Term Goals: Engage patient in aftercare planning with referrals and resources, Increase social support and Increase skills for wellness and recovery  Therapeutic Interventions: Assess for all discharge needs, 1 to 1 time with Social worker, Explore available resources and support systems, Assess for adequacy in community support network, Educate family and significant other(s) on suicide prevention, Complete Psychosocial Assessment, Interpersonal group therapy.  Evaluation of Outcomes: Progressing   Progress in Treatment: Attending groups: Yes. Participating in groups: Yes. Taking medication as prescribed:  Yes. Toleration medication: Yes. Family/Significant other contact made: Yes, individual(s) contacted:  mother Patient understands diagnosis: Yes. Discussing patient identified problems/goals with staff: Yes. Medical problems stabilized or resolved: Yes. Denies suicidal/homicidal ideation: Yes. Issues/concerns per patient self-inventory: No. Other: none  New problem(s) identified: No, Describe:  none  New Short Term/Long Term Goal(s):  Patient Goals:  "Stabilize medication, focus on one thing at a time."  Discharge Plan or Barriers: CSW will continue to assess for appropriate referrals and discharge planning.   Reason for Continuation of Hospitalization: Depression Medication stabilization  Estimated Length of Stay: 2-4 days.   Attendees: Patient:Theresa Crawford 04/07/2018   Physician: Dr. Jama Flavorsobos, MD; Dr. Landry MellowGreg Clary, MD 04/07/2018   Nursing: Roddie McElizabeth Awofadeju, RN; Liborio NixonPatrice White, RN 04/07/2018   RN Care Manager:X 04/07/2018   Social Worker: Daleen SquibbGreg Wierda, LCSW; Baldo DaubJolan Jeyli Crawford, LCSWA 04/07/2018   Recreational Therapist: X 04/07/2018   Other: X 04/07/2018   Other: X 04/07/2018   Other:X 04/07/2018       Scribe for Treatment Team: Theresa Crawford, LCSWA 04/12/2018 11:38 AM

## 2018-04-12 NOTE — Progress Notes (Signed)
Recreation Therapy Notes  Date: 7.31.19 Time: 0930 Location: 300 [address] Dayroom  Group Topic: Stress Management  Goal Area(s) Addresses:  Patient will verbalize importance of using healthy stress management.  Patient will identify positive emotions associated with healthy stress management.   Intervention: Stress Management  Activity :  Guided Imagery.  LRT introduced the stress management technique of guided imagery.  LRT read a script about peaceful waves.  Patients were to follow along as LRT read script to relax and envision being on the beach.  Education:  Stress Management, Discharge Planning.   Education Outcome: Acknowledges edcuation/In group clarification offered/Needs additional education  Clinical Observations/Feedback:  Pt did not attend group.      Caroll RancherMarjette Stacie Templin, LRT/CTRS         Lillia AbedLindsay, Shawntina Diffee A 04/12/2018 10:46 AM

## 2018-04-12 NOTE — Telephone Encounter (Signed)
[facility], they're needing to r/s her procedure for tomorrow... Please call Baldo DaubJolan Williams Social Worker at  [facility] [phone]
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
## 2018-04-12 NOTE — Progress Notes (Signed)
Pt c/o breaking out around her mouth. Pt unsure of what's causing tiny bumps around her mouth. Pt denies any itching to the area. No redness or raised bumps noted. Will continue to monitor.

## 2018-04-13 ENCOUNTER — Ambulatory Visit (HOSPITAL_COMMUNITY): Admission: RE | Admit: 2018-04-13 | Payer: Medicaid Other | Source: Ambulatory Visit | Admitting: Cardiovascular Disease

## 2018-04-13 ENCOUNTER — Encounter (HOSPITAL_COMMUNITY): Admission: RE | Payer: Self-pay | Source: Ambulatory Visit

## 2018-04-13 SURGERY — RIGHT/LEFT HEART CATH AND CORONARY ANGIOGRAPHY
Anesthesia: LOCAL

## 2018-04-13 NOTE — Progress Notes (Signed)
D:  [name] was pleasant and cooperative.  She was anxious and worried about the rash/small bumps around her mouth.  She is worried that it is a possible reaction to medication.  She also believes that it may be heat related because the day room was very hot today.  She started complaining of itching.  Notified Jason NP and orders for hydrocortisone and vistaril for itching noted.  She was given a dose of each.  She did say, "I guess I am just worrying too much."  She denied SOB, swollen tongue or any other rashes on her body.  She appeared to be in no physical distress.  She denied SI/HI or A/V hallucinations.  She did complain of tooth extraction site pain and prn of Vicodin given with good relief.   A:  1:1 with RN for support and encouragement.  Medications as ordered.  Q 15 minute checks maintained for safety.  Encouraged participation in group and unit activities.   R:  [name] remains safe on the unit.  We will continue to monitor the progress towards her goals.
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
## 2018-04-13 NOTE — Telephone Encounter (Signed)
See other phone encounter from 04/12/18.

## 2018-04-13 NOTE — Progress Notes (Signed)
[facility] MD Progress Note  04/13/2018 12:44 PM Theresa Crawford  MRN:  [id number] Subjective: Patient is seen and examined.  Patient is a 37 year old female with past psychiatric history significant for major depression; recurrent as well as generalized anxiety disorder.  She also probably has posttraumatic stress disorder.  She is seen in follow-up.  She continues to be somewhat somatic.  She was worried about allergic reaction because of a rash on her face.  She did get some cortisone cream for it, and apparently it resolved.  I assured the patient that I did not believe that the rash was due to an allergic reaction to medication.  She stated her anxiety and mood were better today.  She stated that she slept well last night with 25 mg of trazodone.  Her EKG was done this morning, and her QTC is essentially unchanged from 04/05/2018.  Her QT was 392, and her QTC was 482.  She denied any suicidal ideation to day.  She is less tearful and her mood is improved. Principal Problem: <principal problem not specified> Diagnosis:   Patient Active Problem List   Diagnosis Date Noted  . MDD (major depressive disorder), recurrent episode (HCC) [F33.9] 04/05/2018  . S/P tooth extraction [K08.409] 04/03/2018  . Suicidal ideation [R45.851] 04/03/2018  . MDD (major depressive disorder) [F32.9]   . Chronic diastolic congestive heart failure (HCC) [I50.32]   . Tricuspid regurgitation [I07.1]   . Hypomagnesemia [E83.42] 03/07/2018  . Tobacco abuse [Z72.0]   . Dyspnea [R06.00] 01/11/2018  . Prolonged QT interval [R94.31] 09/10/2017  . Normochromic normocytic anemia [D64.9]   . Pulmonary edema [J81.1] 07/05/2017  . Hypokalemia [E87.6] 06/12/2017  . Pulmonary hypertension (HCC) [I27.20] 06/12/2017  . Flash pulmonary edema (HCC) [J81.0] 06/11/2017  . Depression with anxiety [F41.8] 06/11/2017  . Recurrent mitral valve stenosis and regurgitation s/p mitral valve repair [I05.2]   . Acute pulmonary edema (HCC) [J81.0]  10/10/2016  . CAP (community acquired pneumonia) [J18.9] 05/25/2016  . Leukocytosis [D72.829] 05/24/2016  . Acute respiratory failure with hypoxia (HCC) [J96.01] 05/23/2016  . Acute on chronic diastolic CHF (congestive heart failure) (HCC) [I50.33] 05/23/2016  . Cough with hemoptysis [R04.2] 05/23/2016  . Postpartum complication pericarditis in 2009 with eventual needing MVR in 2015 [O90.89] 05/23/2016  . Anemia, iron deficiency [D50.9] 05/23/2016  . Hypertension [I10]   . SOB (shortness of breath) [R06.02]   . Respiratory distress [R06.03] 02/16/2016  . Essential hypertension [I10] 02/16/2016  . S/P mitral valve repair [Z98.890] 03/27/2013   Total Time spent with patient: 15 minutes  Past Psychiatric History: See admission H&P  Past Medical History:  Past Medical History:  Diagnosis Date  . Anxiety   . Arthritis    "lower back" (04/04/2018)  . Chronic bronchitis (HCC)   . Chronic diastolic CHF (congestive heart failure) (HCC)   . Chronic lower back pain   . DDD (degenerative disc disease), lumbar   . Depression   . GERD (gastroesophageal reflux disease)   . Headache    "weekly" (04/04/2018)  . Heart murmur   . Hypertension   . Mitral valve disease   . Normocytic anemia   . Obesity   . Palpitations   . Pericarditis   . Premature atrial contractions   . Pulmonary hypertension (HCC)   . PVC's (premature ventricular contractions)    a. h/o palpitations with event monitor in 03/2017 showing NSR with PACs/PVCs.  . Recurrent mitral valve stenosis and regurgitation s/p mitral valve repair   . S/P mitral  valve repair 03/27/2013   [place] valvul[place] including resuspension of entire posterior leaflet using Gore-tex neochords and 30 mm Edwards Physio ring annuloplasty  . Sarcoidosis   . Tobacco abuse   . Tricuspid regurgitation     Past Surgical History:  Procedure Laterality Date  . ABDOMINAL HERNIA REPAIR  ~ 2011  . ABDOMINAL  HYSTERECTOMY  2011   "partial"; PID w/problem with fallopian tubes, had left fallopian and left ovary removed, pt still having periods  . CARDIAC CATHETERIZATION  2014  . CESAREAN SECTION  2004; 2009  . HERNIA REPAIR    . MITRAL VALVE REPAIR  03/27/2013   Dr. Darcus Austin[place] - complex valvuloplasty including resuspension of posterior leaflet with 30 mm CE Physio ring annuloplasty  . MULTIPLE EXTRACTIONS WITH ALVEOLOPLASTY N/A 04/03/2018   Procedure: Extraction of tooth #30 with alveoloplasty and gross debridement of remaining teeth;  Surgeon: Charlynne PanderKulinski, Ronald F, DDS;  [facility];  Service: Oral Surgery;  Laterality: N/A;  . PARTIAL HYSTERECTOMY  2011   PID w/problem with fallopian tubes, had left fallopian and left ovary removed, pt still having periods  . TEE WITHOUT CARDIOVERSION N/A 05/26/2016   Procedure: TRANSESOPHAGEAL ECHOCARDIOGRAM (TEE);  Surgeon: Laqueta LindenSuresh A Koneswaran, MD;  Location: [facility];  Service: Cardiovascular;  Laterality: N/A;  . TEE WITHOUT CARDIOVERSION N/A 01/25/2018   Procedure: TRANSESOPHAGEAL ECHOCARDIOGRAM (TEE) WITH PROPOFOL;  Surgeon: Jonelle SidleMcDowell, Samuel G, MD;  Location: [facility];  Service: Cardiovascular;  Laterality: N/A;   Family History:  Family History  Problem Relation Age of Onset  . Heart failure Father        just received LVAD  . Heart disease Paternal Grandfather        stent placement   Family Psychiatric  History: See admission H&P Social History:  Social History   Substance and Sexual Activity  Alcohol Use Yes   Comment: 04/04/2018 "1-2 beers/month; if that"     Social History   Substance and Sexual Activity  Drug Use Not Currently    Social History   Socioeconomic History  . Marital status: Single    Spouse name: Not on file  . Number of children: 2  . Years of education: Not on file  . Highest education level: Not on file  Occupational History  . Not on file  Social Needs  . Financial resource strain:  Not on file  . Food insecurity:    Worry: Not on file    Inability: Not on file  . Transportation needs:    Medical: Not on file    Non-medical: Not on file  Tobacco Use  . Smoking status: Current Some Day Smoker    Packs/day: 0.50    Years: 24.00    Pack years: 12.00    Types: Cigarettes    Start date: 11/03/1999    Last attempt to quit: 03/20/2018    Years since quitting: 0.0  . Smokeless tobacco: Never Used  Substance and Sexual Activity  . Alcohol use: Yes    Comment: 04/04/2018 "1-2 beers/month; if that"  . Drug use: Not Currently  . Sexual activity: Not Currently  Lifestyle  . Physical activity:    Days per week: Not on file    Minutes per session: Not on file  . Stress: Not on file  Relationships  . Social connections:    Talks on phone: Not on file    Gets together: Not on file  Attends religious service: Not on file    Active member of club or organization: Not on file    Attends meetings of clubs or organizations: Not on file    Relationship status: Not on file  Other Topics Concern  . Not on file  Social History Narrative  . Not on file   Additional Social History:                         Sleep: Good  Appetite:  Good  Current Medications: Current Facility-Administered Medications  Medication Dose Route Frequency Provider Last Rate Last Dose  . acidophilus (RISAQUAD) capsule 1 capsule  1 capsule Oral Daily Rankin, Shuvon B, NP   1 capsule at 04/13/18 0802  . albuterol (PROVENTIL) (2.5 MG/3ML) 0.083% nebulizer solution 2.5 mg  2.5 mg Nebulization Q6H PRN Rankin, Shuvon B, NP      . bisacodyl (DULCOLAX) suppository 10 mg  10 mg Rectal Daily PRN Rankin, Shuvon B, NP      . docusate sodium (COLACE) capsule 100 mg  100 mg Oral Daily PRN Rankin, Shuvon B, NP      . EPINEPHrine (EPI-PEN) injection 0.3 mg  0.3 mg Intramuscular Continuous PRN Nwoko, Agnes I, NP      . ferrous sulfate tablet 325 mg  325 mg Oral Q breakfast Rankin, Shuvon B, NP   325 mg  at 04/13/18 0802  . HYDROcodone-acetaminophen (NORCO/VICODIN) 5-325 MG per tablet 1 tablet  1 tablet Oral Q12H PRN Cobos, Rockey Situ, MD   1 tablet at 04/12/18 2002  . hydrocortisone cream 1 %   Topical BID PRN Nira Conn A, NP      . hydrOXYzine (ATARAX/VISTARIL) tablet 25 mg  25 mg Oral TID PRN Nira Conn A, NP   25 mg at 04/13/18 0807  . LORazepam (ATIVAN) tablet 0.5 mg  0.5 mg Oral Q6H PRN Cobos, Rockey Situ, MD   0.5 mg at 04/13/18 0809  . nicotine (NICODERM CQ - dosed in mg/24 hours) patch 14 mg  14 mg Transdermal Daily Cobos, Rockey Situ, MD   14 mg at 04/13/18 0801  . nicotine polacrilex (NICORETTE) gum 2 mg  2 mg Oral PRN Antonieta Pert, MD   2 mg at 04/11/18 1805  . QUEtiapine (SEROQUEL) tablet 50 mg  50 mg Oral QHS Cobos, Rockey Situ, MD   50 mg at 04/12/18 2216  . senna-docusate (Senokot-S) tablet 1 tablet  1 tablet Oral QHS PRN Rankin, Shuvon B, NP      . sertraline (ZOLOFT) tablet 100 mg  100 mg Oral Daily Antonieta Pert, MD   100 mg at 04/13/18 0802  . torsemide (DEMADEX) tablet 20 mg  20 mg Oral BID Rankin, Shuvon B, NP   20 mg at 04/13/18 0802  . traZODone (DESYREL) tablet 25 mg  25 mg Oral QHS Antonieta Pert, MD   25 mg at 04/12/18 2216    Lab Results: No results found for this or any previous visit (from the past 48 hour(s)).  Blood Alcohol level:  No results found for: [facility]  Metabolic Disorder Labs: No results found for: HGBA1C, MPG No results found for: PROLACTIN No results found for: CHOL, TRIG, HDL, CHOLHDL, VLDL, LDLCALC  Physical Findings: AIMS: Facial and Oral Movements Muscles of Facial Expression: None, normal Lips and Perioral Area: None, normal Jaw: None, normal Tongue: None, normal,Extremity Movements Upper (arms, wrists, hands, fingers): None, normal Lower (legs, knees, ankles, toes): None, normal, Trunk  Movements Neck, shoulders, hips: None, normal, Overall Severity Severity of abnormal movements (highest score from questions above): None,  normal Incapacitation due to abnormal movements: None, normal Patient's awareness of abnormal movements (rate only patient's report): No Awareness, Dental Status Current problems with teeth and/or dentures?: No Does patient usually wear dentures?: No  CIWA:  CIWA-Ar Total: 0 COWS:  COWS Total Score: 1  Musculoskeletal: Strength & Muscle Tone: within normal limits Gait & Station: normal Patient leans: N/A  Psychiatric Specialty Exam: Physical Exam  Constitutional: She is oriented to person, place, and time. She appears well-developed and well-nourished.  HENT:  Head: Normocephalic and atraumatic.  Respiratory: Effort normal.  Neurological: She is alert and oriented to person, place, and time.    ROS  Blood pressure (!) 141/72, pulse (!) 102, temperature 97.6 F (36.4 C), temperature source Oral, resp. rate 20, height 5\' 4"  (1.626 m), weight 100.7 kg (222 lb), last menstrual period 03/15/2018.Body mass index is 38.11 kg/m.  General Appearance: Casual  Eye Contact:  Good  Speech:  Normal Rate  Volume:  Normal  Mood:  Euthymic  Affect:  Appropriate  Thought Process:  Coherent  Orientation:  Full (Time, Place, and Person)  Thought Content:  Logical  Suicidal Thoughts:  No  Homicidal Thoughts:  No  Memory:  Immediate;   Fair Recent;   Fair Remote;   Fair  Judgement:  Intact  Insight:  Fair  Psychomotor Activity:  Normal  Concentration:  Concentration: Fair and Attention Span: Fair  Recall:  [employer] of Knowledge:  Fair  Language:  Good  Akathisia:  Negative  Handed:  Right  AIMS (if indicated):     Assets:  Communication Skills Desire for Improvement Housing Resilience Social Support  ADL's:  Intact  Cognition:  WNL  Sleep:  Number of Hours: 6.25     Treatment Plan Summary: Daily contact with patient to assess and evaluate symptoms and progress in treatment, Medication management and Plan : Patient is seen and examined.  Patient is a 37 year old female with  the above-stated past psychiatric history who is seen in follow-up.  She is doing much better today.  She still remains quite needy, and needing reassurance a great deal.  Her nightmares are stable, the Zoloft is stable, and she denied suicidal ideation and admitted to improve mood.  If she continues to improve like this we will anticipate discharge in 1 to 2 days.  No change in her current medications.  Antonieta Pert, MD 04/13/2018, 12:44 PM

## 2018-04-13 NOTE — [facility] Group Notes (Signed)
Adult Psychoeducational Group Note  Date:  04/13/2018  Time:1600  Group Topic/Focus: Meditation and Deep Breathing Group Leader: Jonathan N, RN  Participation Level:  Active  Participation Quality:  Appropriate and Attentive  Affect:  Appropriate  Cognitive:  Alert and Oriented  Insight: Developing/Improving  Engagement in Group:  Developing/Improving  Modes of Intervention: Activity, Education  Additional Comments:  Patient attended group and was appropriate throughout the activity.  Larue Lightner A Suzan Manon, RN 04/13/2018, 1700  

## 2018-04-14 LAB — BASIC METABOLIC PANEL
ANION GAP: 11 (ref 5–15)
BUN: 16 mg/dL (ref 6–20)
CO2: 28 mmol/L (ref 22–32)
Calcium: 9.2 mg/dL (ref 8.9–10.3)
Chloride: 107 mmol/L (ref 98–111)
Creatinine, Ser: 0.81 mg/dL (ref 0.44–1.00)
GFR calc Af Amer: >60 mL/min (ref >60)
Glucose, Bld: 110 mg/dL — ABNORMAL HIGH (ref 70–99)
POTASSIUM: 3.9 mmol/L (ref 3.5–5.1)
SODIUM: 146 mmol/L — AB (ref 135–145)

## 2018-04-14 LAB — TSH: TSH: 3.962 u[IU]/mL (ref 0.350–4.500)

## 2018-04-14 MED ORDER — QUETIAPINE FUMARATE 50 MG PO TABS
50.0000 mg | ORAL_TABLET | Freq: Once | ORAL | Status: AC
  Administered 2018-04-14: 50 mg via ORAL
  Filled 2018-04-14 (×2): qty 1

## 2018-04-14 NOTE — Progress Notes (Signed)
Adult Psychoeducational Group Note  Date:  04/14/2018 Time:  10:14 AM  Group Topic/Focus:  Orientation:   The focus of this group is to educate the patient on the purpose and policies of crisis stabilization and provide a format to answer questions about their admission.  The group details unit policies and expectations of patients while admitted.  Participation Level:  Active  Participation Quality:  Attentive  Affect:  Appropriate  Cognitive:  Alert  Insight: Good  Engagement in Group:  Engaged  Modes of Intervention:  Discussion and Education  Additional Comments:    Pt participated in orientation/ goals group. Pt's goal today is to manage her anxiety. One of her main triggers is people. Pt states she does not have any positive coping skills that work for her. Pt expressed her concerns that staff members do not engage with the patient's enough to help teach them the skills they need prior to discharge. This staff member offered to sit down with her outside of group and offer her assistance in identifying positive coping skills.   Karren CobbleFizah G Agamjot Kilgallon 04/14/2018, 10:14 AM

## 2018-04-14 NOTE — Progress Notes (Signed)
Pt attend wrap up group. Her day was a 10. The one good thing that happen to her today, sh had a good day new patient came in drew her picture, bond together as friends talked shared so much interaction other pt. She was given a lemon pound cake she missed lunch. The other patients tried to wake her.

## 2018-04-14 NOTE — Progress Notes (Signed)
Patient self invenory- Patient slept well last night, sleep medication was requested and was helpful. Appetite has been fair, energy level low. Depression, hopelessness, and anxiety are rated 5, 3, and 7 out of 10. Patient's pain medication has been helpful. Patient's goal is to work on her anxiety and she will accomplish this by learning other techniques (to manage).  Patient compliant with medications prescribed per provider.  Safety maintained with 15 minute checks as well as environmental checks. Will continue to monitor.

## 2018-04-14 NOTE — [facility] Group Notes (Signed)
Adult Psychoeducational Group Note  Date:  04/14/2018  Time: 4:00 PM  Group Topic/Focus: Music as a Coping Skill Patients choose a song of significance to and explain to the group how the song has helped with their recovery.  Participation Level:  Active  Participation Quality:  Appropriate and Attentive  Affect:  Appropriate  Cognitive:  Alert and Oriented  Insight: Improving  Engagement in Group:  Developing/Improving  Modes of Intervention:  Discussion, Activity, and Education  Additional Comments:  Patient was attentive in group, supported of peers and contributed to the discussion.  Theresa Crawford A Theresa Crawford 04/14/2018 5:00 PM  

## 2018-04-14 NOTE — Plan of Care (Signed)
  Problem: Education: Goal: Knowledge of [facility] Education information/materials will improve 04/14/2018 0940 by Dewayne ShorterJohnson, Arlanda Shiplett, RN Outcome: Progressing 04/14/2018 0940 by Dewayne ShorterJohnson, Lucas Winograd, RN Outcome: Progressing   Problem: Education: Goal: Emotional status will improve 04/14/2018 0940 by Dewayne ShorterJohnson, Matalie Romberger, RN Outcome: Progressing 04/14/2018 0940 by Dewayne ShorterJohnson, Avanna Sowder, RN Outcome: Progressing   Problem: Education: Goal: Mental status will improve 04/14/2018 0940 by Dewayne ShorterJohnson, Kelbi Renstrom, RN Outcome: Progressing 04/14/2018 0940 by Dewayne ShorterJohnson, Talena Neira, RN Outcome: Progressing   Problem: Education: Goal: Verbalization of understanding the information provided will improve 04/14/2018 0940 by Dewayne ShorterJohnson, Yunior Jain, RN Outcome: Progressing 04/14/2018 0940 by Dewayne ShorterJohnson, Oberia Beaudoin, RN Outcome: Progressing

## 2018-04-14 NOTE — Progress Notes (Signed)
Theresa Crawford Progress Note  04/14/2018 1:07 PM Theresa Crawford  MRN:  [id number] Subjective: Patient is seen and examined.  Patient is a 37 year old female with a past psychiatric history significant for major depression; recurrent as well as generalized anxiety disorder and possible posttraumatic stress disorder.  She is seen in follow-up.  Things appeared to be improving yesterday, but she became more anxious last night.  She is anxious and tearful today.  We discussed that.  We discussed her fears about her upcoming cardiac procedures, but more importantly her sense of need of attention to be able to function.  She denied any current suicidal ideation. Principal Problem: <principal problem not specified> Diagnosis:   Patient Active Problem List   Diagnosis Date Noted  . MDD (major depressive disorder), recurrent episode ([place]) [F33.9] 04/05/2018  . S/P tooth extraction [K08.409] 04/03/2018  . Suicidal ideation [R45.851] 04/03/2018  . MDD (major depressive disorder) [F32.9]   . Chronic diastolic congestive heart failure ([place]) [I50.32]   . Tricuspid regurgitation [I07.1]   . Hypomagnesemia [E83.42] 03/07/2018  . Tobacco abuse [Z72.0]   . Dyspnea [R06.00] 01/11/2018  . Prolonged QT interval [R94.31] 09/10/2017  . Normochromic normocytic anemia [D64.9]   . Pulmonary edema [J81.1] 07/05/2017  . Hypokalemia [E87.6] 06/12/2017  . Pulmonary hypertension ([place]) [I27.20] 06/12/2017  . Flash pulmonary edema ([place]) [J81.0] 06/11/2017  . Depression with anxiety [F41.8] 06/11/2017  . Recurrent mitral valve stenosis and regurgitation s/p mitral valve repair [I05.2]   . Acute pulmonary edema ([place]) [J81.0] 10/10/2016  . CAP (community acquired pneumonia) [J18.9] 05/25/2016  . Leukocytosis [D72.829] 05/24/2016  . Acute respiratory failure with hypoxia ([place]) [J96.01] 05/23/2016  . Acute on chronic diastolic CHF (congestive heart failure) ([place]) [I50.33] 05/23/2016  . Cough with hemoptysis [R04.2] 05/23/2016  .  Postpartum complication pericarditis in 2009 with eventual needing MVR in 2015 [O90.89] 05/23/2016  . Anemia, iron deficiency [D50.9] 05/23/2016  . Hypertension [I10]   . SOB (shortness of breath) [R06.02]   . Respiratory distress [R06.03] 02/16/2016  . Essential hypertension [I10] 02/16/2016  . S/P mitral valve repair [Z98.890] 03/27/2013   Total Time spent with patient: 15 minutes  Past Psychiatric History: See admission H&P  Past Medical History:  Past Medical History:  Diagnosis Date  . Anxiety   . Arthritis    "lower back" (04/04/2018)  . Chronic bronchitis ([place])   . Chronic diastolic CHF (congestive heart failure) ([place])   . Chronic lower back pain   . DDD (degenerative disc disease), lumbar   . Depression   . GERD (gastroesophageal reflux disease)   . Headache    "weekly" (04/04/2018)  . Heart murmur   . Hypertension   . Mitral valve disease   . Normocytic anemia   . Obesity   . Palpitations   . Pericarditis   . Premature atrial contractions   . Pulmonary hypertension ([place])   . PVC's (premature ventricular contractions)    a. h/o palpitations with event monitor in 03/2017 showing NSR with PACs/PVCs.  . Recurrent mitral valve stenosis and regurgitation s/p mitral valve repair   . S/P mitral valve repair 03/27/2013   Dr. Fransico Michael - [place], [place] - complex valvuloplasty including resuspension of entire posterior leaflet using Gore-tex neochords and 30 mm Edwards Physio ring annuloplasty  . Sarcoidosis   . Tobacco abuse   . Tricuspid regurgitation     Past Surgical History:  Procedure Laterality Date  . ABDOMINAL HERNIA REPAIR  ~ 2011  . ABDOMINAL HYSTERECTOMY  2011   "  partial"; PID w/problem with fallopian tubes, had left fallopian and left ovary removed, pt still having periods  . CARDIAC CATHETERIZATION  2014  . CESAREAN SECTION  2004; 2009  . HERNIA REPAIR    . MITRAL VALVE REPAIR  03/27/2013   Dr. Fransico Michael - [place], [place]. - complex valvuloplasty  including resuspension of posterior leaflet with 30 mm CE Physio ring annuloplasty  . MULTIPLE EXTRACTIONS WITH ALVEOLOPLASTY N/A 04/03/2018   Procedure: Extraction of tooth #30 with alveoloplasty and gross debridement of remaining teeth;  Surgeon: Lenn Cal, DDS;  Location: [facility];  Service: Oral Surgery;  Laterality: N/A;  . PARTIAL HYSTERECTOMY  2011   PID w/problem with fallopian tubes, had left fallopian and left ovary removed, pt still having periods  . TEE WITHOUT CARDIOVERSION N/A 05/26/2016   Procedure: TRANSESOPHAGEAL ECHOCARDIOGRAM (TEE);  Surgeon: Herminio Commons, Crawford;  [facility];  Service: Cardiovascular;  Laterality: N/A;  . TEE WITHOUT CARDIOVERSION N/A 01/25/2018   Procedure: TRANSESOPHAGEAL ECHOCARDIOGRAM (TEE) WITH PROPOFOL;  Surgeon: Satira Sark, Crawford;  [facility];  Service: Cardiovascular;  Laterality: N/A;   Family History:  Family History  Problem Relation Age of Onset  . Heart failure Father        just received LVAD  . Heart disease Paternal Grandfather        stent placement   Family Psychiatric  History: See admission H&P Social History:  Social History   Substance and Sexual Activity  Alcohol Use Yes   Comment: 04/04/2018 "1-2 beers/month; if that"     Social History   Substance and Sexual Activity  Drug Use Not Currently    Social History   Socioeconomic History  . Marital status: Single    Spouse name: Not on file  . Number of children: 2  . Years of education: Not on file  . Highest education level: Not on file  Occupational History  . Not on file  Social Needs  . Financial resource strain: Not on file  . Food insecurity:    Worry: Not on file    Inability: Not on file  . Transportation needs:    Medical: Not on file    Non-medical: Not on file  Tobacco Use  . Smoking status: Current Some Day Smoker    Packs/day: 0.50    Years: 24.00    Pack years: 12.00    Types: Cigarettes    Start date:  11/03/1999    Last attempt to quit: 03/20/2018    Years since quitting: 0.0  . Smokeless tobacco: Never Used  Substance and Sexual Activity  . Alcohol use: Yes    Comment: 04/04/2018 "1-2 beers/month; if that"  . Drug use: Not Currently  . Sexual activity: Not Currently  Lifestyle  . Physical activity:    Days per week: Not on file    Minutes per session: Not on file  . Stress: Not on file  Relationships  . Social connections:    Talks on phone: Not on file    Gets together: Not on file    Attends religious service: Not on file    Active member of club or organization: Not on file    Attends meetings of clubs or organizations: Not on file    Relationship status: Not on file  Other Topics Concern  . Not on file  Social History Narrative  . Not on file   Additional Social History:  Sleep: Fair  Appetite:  Fair  Current Medications: Current Facility-Administered Medications  Medication Dose Route Frequency Provider Last Rate Last Dose  . acidophilus (RISAQUAD) capsule 1 capsule  1 capsule Oral Daily Rankin, Shuvon B, NP   1 capsule at 04/14/18 0808  . albuterol (PROVENTIL) (2.5 MG/3ML) 0.083% nebulizer solution 2.5 mg  2.5 mg Nebulization Q6H PRN Rankin, Shuvon B, NP      . bisacodyl (DULCOLAX) suppository 10 mg  10 mg Rectal Daily PRN Rankin, Shuvon B, NP      . docusate sodium (COLACE) capsule 100 mg  100 mg Oral Daily PRN Rankin, Shuvon B, NP      . EPINEPHrine (EPI-PEN) injection 0.3 mg  0.3 mg Intramuscular Continuous PRN Nwoko, Agnes I, NP      . ferrous sulfate tablet 325 mg  325 mg Oral Q breakfast Rankin, Shuvon B, NP   325 mg at 04/14/18 0808  . HYDROcodone-acetaminophen (NORCO/VICODIN) 5-325 MG per tablet 1 tablet  1 tablet Oral Q12H PRN Cobos, Myer Peer, Crawford   1 tablet at 04/13/18 1406  . hydrocortisone cream 1 %   Topical BID PRN Lindon Romp A, NP      . hydrOXYzine (ATARAX/VISTARIL) tablet 25 mg  25 mg Oral TID PRN Lindon Romp A,  NP   25 mg at 04/14/18 0813  . LORazepam (ATIVAN) tablet 0.5 mg  0.5 mg Oral Q6H PRN Cobos, Myer Peer, Crawford   0.5 mg at 04/14/18 1042  . nicotine (NICODERM CQ - dosed in mg/24 hours) patch 14 mg  14 mg Transdermal Daily Cobos, Myer Peer, Crawford   14 mg at 04/14/18 0808  . nicotine polacrilex (NICORETTE) gum 2 mg  2 mg Oral PRN Sharma Covert, Crawford   2 mg at 04/11/18 1805  . QUEtiapine (SEROQUEL) tablet 50 mg  50 mg Oral QHS Cobos, Myer Peer, Crawford   50 mg at 04/13/18 2136  . senna-docusate (Senokot-S) tablet 1 tablet  1 tablet Oral QHS PRN Rankin, Shuvon B, NP      . sertraline (ZOLOFT) tablet 100 mg  100 mg Oral Daily Sharma Covert, Crawford   100 mg at 04/14/18 7169  . torsemide (DEMADEX) tablet 20 mg  20 mg Oral BID Rankin, Shuvon B, NP   20 mg at 04/14/18 0808  . traZODone (DESYREL) tablet 25 mg  25 mg Oral QHS Sharma Covert, Crawford   25 mg at 04/13/18 2136    Lab Results:  Results for orders placed or performed during the hospital encounter of 04/05/18 (from the past 48 hour(s))  TSH     Status: None   Collection Time: 04/14/18  6:38 AM  Result Value Ref Range   TSH 3.962 0.350 - 4.500 uIU/mL    Comment: Performed by a 3rd Generation assay with a functional sensitivity of <=0.01 uIU/mL. Performed at [facility], [place] [address] [place], [place] [address]   Basic metabolic panel     Status: Abnormal   Collection Time: 04/14/18  6:38 AM  Result Value Ref Range   Sodium 146 (H) 135 - 145 mmol/L   Potassium 3.9 3.5 - 5.1 mmol/L   Chloride 107 98 - 111 mmol/L   CO2 28 22 - 32 mmol/L   Glucose, Bld 110 (H) 70 - 99 mg/dL   BUN 16 6 - 20 mg/dL   Creatinine, Ser 0.81 0.44 - 1.00 mg/dL   Calcium 9.2 8.9 - 10.3 mg/dL   GFR calc non Af Amer >  60 >60 mL/min   GFR calc Af Amer >60 >60 mL/min    Comment: (NOTE) The eGFR has been calculated using the CKD EPI equation. This calculation has not been validated in all clinical situations. eGFR's persistently <60 mL/min signify  possible Chronic Kidney Disease.    Anion gap 11 5 - 15    Comment: Performed at [facility], [place] [address], [place], [place] [address]    Blood Alcohol level:  No results found for: [facility]  Metabolic Disorder Labs: No results found for: HGBA1C, MPG No results found for: PROLACTIN No results found for: CHOL, TRIG, HDL, CHOLHDL, VLDL, LDLCALC  Physical Findings: AIMS: Facial and Oral Movements Muscles of Facial Expression: None, normal Lips and Perioral Area: None, normal Jaw: None, normal Tongue: None, normal,Extremity Movements Upper (arms, wrists, hands, fingers): None, normal Lower (legs, knees, ankles, toes): None, normal, Trunk Movements Neck, shoulders, hips: None, normal, Overall Severity Severity of abnormal movements (highest score from questions above): None, normal Incapacitation due to abnormal movements: None, normal Patient's awareness of abnormal movements (rate only patient's report): No Awareness, Dental Status Current problems with teeth and/or dentures?: No Does patient usually wear dentures?: No  CIWA:  CIWA-Ar Total: 0 COWS:  COWS Total Score: 1  Musculoskeletal: Strength & Muscle Tone: within normal limits Gait & Station: normal Patient leans: N/A  Psychiatric Specialty Exam: Physical Exam  Nursing note and vitals reviewed. Constitutional: She is oriented to person, place, and time. She appears well-developed and well-nourished.  HENT:  Head: Normocephalic and atraumatic.  Respiratory: Effort normal.  Neurological: She is alert and oriented to person, place, and time.    ROS  Blood pressure (!) 148/97, pulse (!) 112, temperature 97.8 F (36.6 C), temperature source Oral, resp. rate 20, height 5' 4"  (1.626 m), weight 100.7 kg (222 lb), last menstrual period 03/15/2018.Body mass index is 38.11 kg/m.  General Appearance: Casual  Eye Contact:  Minimal  Speech:  Normal Rate  Volume:  Normal  Mood:  Depressed and Tearful   Affect:  Tearful  Thought Process:  Coherent  Orientation:  Full (Time, Place, and Person)  Thought Content:  Logical  Suicidal Thoughts:  No  Homicidal Thoughts:  No  Memory:  Immediate;   Fair Recent;   Fair Remote;   Fair  Judgement:  Intact  Insight:  Fair  Psychomotor Activity:  Increased  Concentration:  Concentration: Fair and Attention Span: Fair  Recall:  [employer] of Knowledge:  Fair  Language:  Good  Akathisia:  Negative  Handed:  Right  AIMS (if indicated):     Assets:  Communication Skills Desire for Improvement Housing Resilience Social Support  ADL's:  Intact  Cognition:  WNL  Sleep:  Number of Hours: 3.75     Treatment Plan Summary: Daily contact with patient to assess and evaluate symptoms and progress in treatment, Medication management and Plan : Patient is seen and examined.  Patient is a 37 year old female with the above-stated past psychiatric history seen in follow-up.  She remains very anxious about discharge, going home with her mother, and lacking any what she considers to be social support.  We discussed this at length today.  Her Zoloft was just increased to 100 mg yesterday, and I do not see any need to increase it today.  We will continue the Seroquel 50 mg p.o. nightly.  Her EKG yesterday was essentially unchanged from previous, so no other changes in her medications.  Hopefully she can come to  grips with her fears and concerns about her mother and support and we can get her home shortly.  Sharma Covert, Crawford 04/14/2018, 1:07 PM

## 2018-04-14 NOTE — Plan of Care (Signed)
  Problem: Education: Goal: Knowledge of [place] [facility] Education information/materials will improve Outcome: Progressing   Problem: Education: Goal: Mental status will improve Outcome: Progressing   Problem: Education: Goal: Verbalization of understanding the information provided will improve Outcome: Progressing   Problem: Activity: Goal: Interest or engagement in activities will improve Outcome: Progressing

## 2018-04-14 NOTE — Progress Notes (Signed)
DAR Note: Pt observed in the dayroom interacting with peers. Pt endorsed moderate anxiety; "I am always anxious." Pt denied depression SI, HI or pain. All patient's questions and concerns addressed. Support, encouragement, and safe environment provided. 15-minute safety checks continue. Safety checks continue. Pt continue to be med compliant.

## 2018-04-14 NOTE — Progress Notes (Signed)
Recreation Therapy Notes  Date: 8.2.19 Time: 0930 Location: [address] Dayroom  Group Topic: Stress Management  Goal Area(s) Addresses:  Patient will verbalize importance of using healthy stress management.  Patient will identify positive emotions associated with healthy stress management.   Intervention: Stress Management  Activity : Meditation.  LRT introduced the stress management technique of meditation.  LRT played a meditation on gratitude.  Patients were to follow along as meditation played to engage in activity.  Education:  Stress Management, Discharge Planning.   Education Outcome: Acknowledges edcuation/In group clarification offered/Needs additional education  Clinical Observations/Feedback: Pt did not attend group.     Caroll RancherMarjette Saraann Enneking, LRT/CTRS         Lillia AbedLindsay, Navy Rothschild A 04/14/2018 11:07 AM

## 2018-04-15 MED ORDER — LORAZEPAM 0.5 MG PO TABS: 0.5000 mg | ORAL_TABLET | Freq: Four times a day (QID) | ORAL | 0 refills | Status: DC | PRN

## 2018-04-15 MED ORDER — QUETIAPINE FUMARATE 50 MG PO TABS: 50.0000 mg | ORAL_TABLET | Freq: Every day | ORAL | 0 refills | Status: DC

## 2018-04-15 MED ORDER — NICOTINE POLACRILEX 2 MG MT GUM: 2.0000 mg | CHEWING_GUM | OROMUCOSAL | 0 refills | Status: DC | PRN

## 2018-04-15 MED ORDER — SERTRALINE HCL 100 MG PO TABS: 100.0000 mg | ORAL_TABLET | Freq: Every day | ORAL | 0 refills | Status: DC

## 2018-04-15 MED ORDER — TRAZODONE HCL 50 MG PO TABS: 25.0000 mg | ORAL_TABLET | Freq: Every day | ORAL | 0 refills | Status: DC

## 2018-04-15 NOTE — [facility] Group Notes (Signed)
LCSW Group Therapy Note  04/15/2018   10:00--11:00am   Type of Therapy and Topic:  Group Therapy: Anger Cues and Responses  Participation Level:  Active   Description of Group:   In this group, patients learned how to recognize the physical, cognitive, emotional, and behavioral responses they have to anger-provoking situations.  They identified a recent time they became angry and how they reacted.  They analyzed how their reaction was possibly beneficial and how it was possibly unhelpful.  The group discussed a variety of healthier coping skills that could help with such a situation in the future.  Deep breathing was practiced briefly.  Therapeutic Goals: 1. Patients will remember their last incident of anger and how they felt emotionally and physically, what their thoughts were at the time, and how they behaved. 2. Patients will identify how their behavior at that time worked for them, as well as how it worked against them. 3. Patients will explore possible new behaviors to use in future anger situations. 4. Patients will learn that anger itself is normal and cannot be eliminated, and that healthier reactions can assist with resolving conflict rather than worsening situations.  Summary of Patient Progress:  The patient shared that his most recent time being anger was in the morning and it involved his interaction with a tech. He stated that it initially made feel mad but he was to remove himself from the situation and think things through. The patient is able to articulate understanding of the physical and emotional responses associated with anger. He is aware of his own tensing of muscles and assuming a fighting posture however states that in the past he had difficulty making a decision not to engage in physical confrontations. The patient states he will remove himself from the situation as one way he will cope with anger going forward.  Therapeutic Modalities:   Cognitive Behavioral  Therapy  Evorn Gongonnie D. Monaye Blackie LCSW

## 2018-04-15 NOTE — Progress Notes (Signed)
  [facility] :  Will you be returning to the same living situation after discharge:  Yes,  patient reports returning to same living situation. At discharge, do you have transportation home?: Yes,  patient reports access to transportation at discharge during [facility]. Do you have the ability to pay for your medications: No. Patient reports no income.  Release of information consent forms completed and in the chart;  Patient's signature needed at discharge.  Patient to Follow up at: Follow-up Information    Services, [facility]. Go on 04/19/2018.   Why:  Appointment for medication management services is Wednesday, 04/19/18 at 10:00am. Please be sure to bring your Photo ID, SS card, proof of household income and any discharge paperwork from this hospitalization.  Contact information: [address] [place] [id number] [phone]        [facility]. Go on 04/25/2018.   Why:  Appointment for therapy services is Tuesday, 04/25/18 at 2:00pm. Please be sure to bring any discharge paperwork from this hospitalization.  Contact information: [address]  [place] [phone]          Next level of care provider [facility]:no  Safety Planning and Suicide Prevention discussed: Yes,  completed.   Have you used any form of tobacco in the last 30 days? (Cigarettes, Smokeless Tobacco, Cigars, and/or Pipes): Yes  Has patient been referred to the Quitline?: Patient declined.  Patient has been referred for addiction treatment: N/A  Shellia CleverlyStephanie N Hanne Kegg, LCSW 04/15/2018, 11:57 AM

## 2018-04-15 NOTE — Discharge Summary (Signed)
Physician Discharge Summary Note  Patient:  Theresa Crawford is an 37 y.o., female MRN:  [id number] DOB:  [birth year] Patient phone:  [phone] (home)  Patient address:   [address] [place] [address],  Total Time spent with patient: 20 minutes  Date of Admission:  04/05/2018 Date of Discharge: 04/15/2018  Reason for Admission: per assessment note: 37 year old female. History of cardiac illness ( mitral valve stenosis) and has upcoming elective surgery for mitral valve replacement in August. In preparation for this she had dental procedure for tooth extraction  and debridement . During this treatment she reportedly expressed suicidal ideations. Describes these mostly as passive thoughts of death, but states she has had intermittent thoughts of overdosing, and acknowledges she has been struggling with depression intermittently for " a long time". She reports she has been off psychiatric medications ( Seroquel, Celexa) x several weeks to months. Attributes her worsening depression to concerns about her physical health, concerns that she may have complications and that her life will change .States " like I am going to need to be on blood thinners for ever, what if they don't work".Gertie Gowda neuro-vegetative symptoms as below. Reports vague hallucinations, mostly at night- hears name being called.     Principal Problem: MDD (major depressive disorder), recurrent episode [facility]) Discharge Diagnoses: Patient Active Problem List   Diagnosis Date Noted  . MDD (major depressive disorder), recurrent episode (HCC) [F33.9] 04/05/2018  . S/P tooth extraction [K08.409] 04/03/2018  . Suicidal ideation [R45.851] 04/03/2018  . MDD (major depressive disorder) [F32.9]   . Chronic diastolic congestive heart failure (HCC) [I50.32]   . Tricuspid regurgitation [I07.1]   . Hypomagnesemia [E83.42] 03/07/2018  . Tobacco abuse [Z72.0]   . Dyspnea [R06.00] 01/11/2018  . Prolonged QT interval [R94.31]  09/10/2017  . Normochromic normocytic anemia [D64.9]   . Pulmonary edema [J81.1] 07/05/2017  . Hypokalemia [E87.6] 06/12/2017  . Pulmonary hypertension (HCC) [I27.20] 06/12/2017  . Flash pulmonary edema (HCC) [J81.0] 06/11/2017  . Depression with anxiety [F41.8] 06/11/2017  . Recurrent mitral valve stenosis and regurgitation s/p mitral valve repair [I05.2]   . Acute pulmonary edema (HCC) [J81.0] 10/10/2016  . CAP (community acquired pneumonia) [J18.9] 05/25/2016  . Leukocytosis [D72.829] 05/24/2016  . Acute respiratory failure with hypoxia (HCC) [J96.01] 05/23/2016  . Acute on chronic diastolic CHF (congestive heart failure) (HCC) [I50.33] 05/23/2016  . Cough with hemoptysis [R04.2] 05/23/2016  . Postpartum complication pericarditis in 2009 with eventual needing MVR in 2015 [O90.89] 05/23/2016  . Anemia, iron deficiency [D50.9] 05/23/2016  . Hypertension [I10]   . SOB (shortness of breath) [R06.02]   . Respiratory distress [R06.03] 02/16/2016  . Essential hypertension [I10] 02/16/2016  . S/P mitral valve repair [Z98.890] 03/27/2013    Past Psychiatric History:   Past Medical History:  Past Medical History:  Diagnosis Date  . Anxiety   . Arthritis    "lower back" (04/04/2018)  . Chronic bronchitis (HCC)   . Chronic diastolic CHF (congestive heart failure) (HCC)   . Chronic lower back pain   . DDD (degenerative disc disease), lumbar   . Depression   . GERD (gastroesophageal reflux disease)   . Headache    "weekly" (04/04/2018)  . Heart murmur   . Hypertension   . Mitral valve disease   . Normocytic anemia   . Obesity   . Palpitations   . Pericarditis   . Premature atrial contractions   . Pulmonary hypertension (HCC)   . PVC's (premature ventricular contractions)  a. h/o palpitations with event monitor in 03/2017 showing NSR with PACs/PVCs.  . Recurrent mitral valve stenosis and regurgitation s/p mitral valve repair   . S/P mitral valve repair 03/27/2013   Dr. Darcus Austin - [place], [place] - complex valvuloplasty including resuspension of entire posterior leaflet using Gore-tex neochords and 30 mm Edwards Physio ring annuloplasty  . Sarcoidosis   . Tobacco abuse   . Tricuspid regurgitation     Past Surgical History:  Procedure Laterality Date  . ABDOMINAL HERNIA REPAIR  ~ 2011  . ABDOMINAL HYSTERECTOMY  2011   "partial"; PID w/problem with fallopian tubes, had left fallopian and left ovary removed, pt still having periods  . CARDIAC CATHETERIZATION  2014  . CESAREAN SECTION  2004; 2009  . HERNIA REPAIR    . MITRAL VALVE REPAIR  03/27/2013   Dr. Darcus Austin - [place], [place]. - complex valvuloplasty including resuspension of posterior leaflet with 30 mm CE Physio ring annuloplasty  . MULTIPLE EXTRACTIONS WITH ALVEOLOPLASTY N/A 04/03/2018   Procedure: Extraction of tooth #30 with alveoloplasty and gross debridement of remaining teeth;  Surgeon: Charlynne Pander, DDS;  Location: [facility] OR;  Service: Oral Surgery;  Laterality: N/A;  . PARTIAL HYSTERECTOMY  2011   PID w/problem with fallopian tubes, had left fallopian and left ovary removed, pt still having periods  . TEE WITHOUT CARDIOVERSION N/A 05/26/2016   Procedure: TRANSESOPHAGEAL ECHOCARDIOGRAM (TEE);  Surgeon: [name], MD;  Location: [facility] SUITE;  Service: Cardiovascular;  Laterality: N/A;  . TEE WITHOUT CARDIOVERSION N/A 01/25/2018   Procedure: TRANSESOPHAGEAL ECHOCARDIOGRAM (TEE) WITH PROPOFOL;  Surgeon: Jonelle Sidle, MD;  Location: [facility] SUITE;  Service: Cardiovascular;  Laterality: N/A;   Family History:  Family History  Problem Relation Age of Onset  . Heart failure Father        just received LVAD  . Heart disease Paternal Grandfather        stent placement   Family Psychiatric  History:  Social History:  Social History   Substance and Sexual Activity  Alcohol Use Yes   Comment: 04/04/2018 "1-2 beers/month; if that"     Social History   Substance and Sexual  Activity  Drug Use Not Currently    Social History   Socioeconomic History  . Marital status: Single    Spouse name: Not on file  . Number of children: 2  . Years of education: Not on file  . Highest education level: Not on file  Occupational History  . Not on file  Social Needs  . Financial resource strain: Not on file  . Food insecurity:    Worry: Not on file    Inability: Not on file  . Transportation needs:    Medical: Not on file    Non-medical: Not on file  Tobacco Use  . Smoking status: Current Some Day Smoker    Packs/day: 0.50    Years: 24.00    Pack years: 12.00    Types: Cigarettes    Start date: 11/03/1999    Last attempt to quit: 03/20/2018    Years since quitting: 0.0  . Smokeless tobacco: Never Used  Substance and Sexual Activity  . Alcohol use: Yes    Comment: 04/04/2018 "1-2 beers/month; if that"  . Drug use: Not Currently  . Sexual activity: Not Currently  Lifestyle  . Physical activity:    Days per week: Not on file    Minutes per session: Not on file  . Stress: Not  on file  Relationships  . Social connections:    Talks on phone: Not on file    Gets together: Not on file    Attends religious service: Not on file    Active member of club or organization: Not on file    Attends meetings of clubs or organizations: Not on file    Relationship status: Not on file  Other Topics Concern  . Not on file  Social History Narrative  . Not on file    Hospital Course:  Janalee DaneLatasha Y Godette was admitted for MDD (major depressive disorder), recurrent episode (HCC)  and crisis management.  Pt was treated discharged with the medications listed below under Medication List.  Medical problems were identified and treated as needed.  Home medications were restarted as appropriate.  Improvement was monitored by observation and Janalee DaneLatasha Y Wortmann 's daily report of symptom reduction.  Emotional and mental status was monitored by daily self-inventory reports completed by Janalee DaneLatasha Y  Tucci and clinical staff.         Janalee DaneLatasha Y Prows was evaluated by the treatment team for stability and plans for continued recovery upon discharge. Janalee DaneLatasha Y Rathe 's motivation was an integral factor for scheduling further treatment. Employment, transportation, bed availability, health status, family support, and any pending legal issues were also considered during hospital stay. Pt was offered further treatment options upon discharge including but not limited to Residential, Intensive Outpatient, and Outpatient treatment.  Janalee DaneLatasha Y Florek will follow up with the services as listed below under Follow Up Information.     Upon completion of this admission the patient was both mentally and medically stable for discharge denying suicidal/homicidal ideation, auditory/visual/tactile hallucinations, delusional thoughts and paranoia.    Janalee DaneLatasha Y Staszak responded well to treatment with BMP, CBC without adverse effects. Pt demonstrated improvement without reported or observed adverse effects to the point of stability appropriate for outpatient management. Pertinent labs include: CBC and BMP for which outpatient follow-up is necessary for lab recheck as mentioned below. Reviewed CBC, CMP, BAL, and UDS; all unremarkable aside from noted exceptions.   Physical Findings: AIMS: Facial and Oral Movements Muscles of Facial Expression: None, normal Lips and Perioral Area: None, normal Jaw: None, normal Tongue: None, normal,Extremity Movements Upper (arms, wrists, hands, fingers): None, normal Lower (legs, knees, ankles, toes): None, normal, Trunk Movements Neck, shoulders, hips: None, normal, Overall Severity Severity of abnormal movements (highest score from questions above): None, normal Incapacitation due to abnormal movements: None, normal Patient's awareness of abnormal movements (rate only patient's report): No Awareness, Dental Status Current problems with teeth and/or dentures?: No Does patient usually wear  dentures?: No  CIWA:  CIWA-Ar Total: 0 COWS:  COWS Total Score: 1  Musculoskeletal: Strength & Muscle Tone: within normal limits Gait & Station: normal Patient leans: N/A  Psychiatric Specialty Exam: See SRA by MD  Physical Exam  ROS  Blood pressure 137/81, pulse 92, temperature 98.4 F (36.9 C), temperature source Oral, resp. rate (!) 24, height 5\' 4"  (1.626 m), weight 100.7 kg (222 lb), SpO2 100 %.Body mass index is 38.11 kg/m.   Have you used any form of tobacco in the last 30 days? (Cigarettes, Smokeless Tobacco, Cigars, and/or Pipes): Yes  Has this patient used any form of tobacco in the last 30 days? (Cigarettes, Smokeless Tobacco, Cigars, and/or Pipes) Yes, Yes, A prescription for an FDA-approved tobacco cessation medication was offered at discharge and the patient refused  Blood Alcohol level:  [facility] results found for: [facility]  Metabolic Disorder Labs:  No results found for: HGBA1C, MPG No results found for: PROLACTIN No results found for: CHOL, TRIG, HDL, CHOLHDL, VLDL, LDLCALC  See Psychiatric Specialty Exam and Suicide Risk Assessment completed by Attending Physician prior to discharge.  Discharge destination:  Home  Is patient on multiple antipsychotic therapies at discharge:  No   Has Patient had three or more failed trials of antipsychotic monotherapy by history:  No  Recommended Plan for Multiple Antipsychotic Therapies: NA  Discharge Instructions    Diet - low sodium heart healthy   Complete by:  As directed    Discharge instructions   Complete by:  As directed    Take all medications as prescribed. Keep all follow-up appointments as scheduled.  Do not consume alcohol or use illegal drugs while on prescription medications. Report any adverse effects from your medications to your primary care provider promptly.  In the event of recurrent symptoms or worsening symptoms, call 911, a crisis hotline, or go to the nearest emergency department for evaluation.    Increase activity slowly   Complete by:  As directed      Allergies as of 04/15/2018      Reactions   Bee Venom Anaphylaxis   Lisinopril Shortness Of Breath, Swelling, Other (See Comments)   Throat swelling   Penicillins Shortness Of Breath, Swelling, Other (See Comments)   Has patient had a PCN reaction causing immediate rash, facial/tongue/throat swelling, SOB or lightheadedness with hypotension: Yes Has patient had a PCN reaction causing severe rash involving mucus membranes or skin necrosis: Yes Has patient had a PCN reaction that required hospitalization No Has patient had a PCN reaction occurring within the last 10 years: No If all of the above answers are "NO", then may proceed with Cephalosporin use.   Perflutren Lipid Microsphere Shortness Of Breath, Other (See Comments)   Other reaction(s): Chest Pain/Tightness   Shellfish Allergy Anaphylaxis   Contrast Media [iodinated Diagnostic Agents] Hives, Itching, Other (See Comments)   Itching and hives to throat, neck, face and arms.   No difficulty breathing.   This was given at [facility] approximately 2015. Contrast Dye      Medication List    STOP taking these medications   citalopram 40 MG tablet Commonly known as:  CELEXA   clindamycin 300 MG capsule Commonly known as:  CLEOCIN   metoprolol succinate 25 MG 24 hr tablet Commonly known as:  TOPROL-XL   potassium chloride SA 20 MEQ tablet Commonly known as:  K-DUR,KLOR-CON   torsemide 20 MG tablet Commonly known as:  DEMADEX     TAKE these medications     Indication  albuterol 108 (90 Base) MCG/ACT inhaler Commonly known as:  PROVENTIL HFA;VENTOLIN HFA Inhale 2 puffs into the lungs every 6 (six) hours as needed for wheezing or shortness of breath.  Indication:  Asthma   docusate sodium 100 MG capsule Commonly known as:  COLACE Take 100 mg by mouth daily as needed for moderate constipation.  Indication:  Constipation   EPIPEN 2-PAK 0.3 mg/0.3 mL  Soaj injection Generic drug:  EPINEPHrine Inject 0.3 Units into the muscle once.  Indication:  Life-Threatening Hypersensitivity Reaction   ferrous sulfate 325 (65 FE) MG tablet Take 1 tablet (325 mg total) by mouth 2 (two) times daily with a meal. What changed:  when to take this  Indication:  Anemia From Inadequate Iron in the Body   LORazepam 0.5 MG tablet Commonly known as:  ATIVAN Take 1 tablet (  0.5 mg total) by mouth every 6 (six) hours as needed for anxiety or sleep.  Indication:  Feeling Anxious   nicotine polacrilex 2 MG gum Commonly known as:  NICORETTE Take 1 each (2 mg total) by mouth as needed for smoking cessation.  Indication:  Nicotine Addiction   PROBIOTIC PO Take 1 capsule by mouth daily.  Indication:  probiotic   QUEtiapine 50 MG tablet Commonly known as:  SEROQUEL Take 1 tablet (50 mg total) by mouth at bedtime. What changed:    medication strength  how much to take  Indication:  Depressive Phase of Manic-Depression, Generalized Anxiety Disorder   sertraline 100 MG tablet Commonly known as:  ZOLOFT Take 1 tablet (100 mg total) by mouth daily. Start taking on:  04/16/2018  Indication:  Major Depressive Disorder   traZODone 50 MG tablet Commonly known as:  DESYREL Take 0.5 tablets (25 mg total) by mouth at bedtime.  Indication:  Anxiety Disorder      Follow-up Information    Services, [facility]. Go on 04/19/2018.   Why:  Appointment for medication management services is Wednesday, 04/19/18 at 10:00am. Please be sure to bring your Photo ID, SS card, proof of household income and any discharge paperwork from this hospitalization.  Contact information: [address] [place] [address] [phone]        [facility]. Go on 04/25/2018.   Why:  Appointment for therapy services is Tuesday, 04/25/18 at 2:00pm. Please be sure to bring any discharge paperwork from this hospitalization.  Contact information: [address]  [place] [phone]          Follow-up recommendations:  Activity:  as tolerated Diet:  heart healthy   Comments:   Take all medications as prescribed. Keep all follow-up appointments as scheduled.  Do not consume alcohol or use illegal drugs while on prescription medications. Report any adverse effects from your medications to your primary care provider promptly.  In the event of recurrent symptoms or worsening symptoms, call 911, a crisis hotline, or go to the nearest emergency department for evaluation.   Signed: Oneta Rack, NP 04/15/2018, 3:03 PM

## 2018-04-15 NOTE — Progress Notes (Signed)
Adult Psychoeducational Group Note  Date:  04/15/2018 Time:  3:04 AM  Group Topic/Focus:  Wrap-Up Group:   The focus of this group is to help patients review their daily goal of treatment and discuss progress on daily workbooks.  Participation Level:  Active  Participation Quality:  Attentive  Affect:  Appropriate  Cognitive:  Appropriate  Insight: Appropriate  Engagement in Group: Engaged  Modes of Intervention:  Discussion  Additional Comments: Pt stated that goal of utilizing meditation was met today.  Pt stated she had positive phone conversations and interaction with other patients put her in a good mood.  Pt rated the day at a 9/10. Olayinka Gathers 04/15/2018, 3:04 AM

## 2018-04-15 NOTE — [facility] Group Notes (Signed)
[facility] Group Notes:  (Nursing/MHT/Case Management/Adjunct)  Date:  04/15/2018  Time:  2:46 PM  Type of Therapy:  Psychoeducational Skills  Participation Level:  Active  Participation Quality:  Appropriate  Affect:  Appropriate  Cognitive:  Appropriate  Insight:  Appropriate  Engagement in Group:  Engaged  Modes of Intervention:  Problem-solving  Summary of Progress/Problems: Pt attended Psychoeducational group with top topic anger management.   Tonyetta Berko Shanta 04/15/2018, 2:46 PM 

## 2018-04-15 NOTE — [facility] Suicide Risk Assessment (Signed)
[facility]   Principal Problem: <principal problem not specified> Discharge Diagnoses:  Patient Active Problem List   Diagnosis Date Noted  . MDD (major depressive disorder), recurrent episode (HCC) [F33.9] 04/05/2018  . S/P tooth extraction [K08.409] 04/03/2018  . Suicidal ideation [R45.851] 04/03/2018  . MDD (major depressive disorder) [F32.9]   . Chronic diastolic congestive heart failure (HCC) [I50.32]   . Tricuspid regurgitation [I07.1]   . Hypomagnesemia [E83.42] 03/07/2018  . Tobacco abuse [Z72.0]   . Dyspnea [R06.00] 01/11/2018  . Prolonged QT interval [R94.31] 09/10/2017  . Normochromic normocytic anemia [D64.9]   . Pulmonary edema [J81.1] 07/05/2017  . Hypokalemia [E87.6] 06/12/2017  . Pulmonary hypertension (HCC) [I27.20] 06/12/2017  . Flash pulmonary edema (HCC) [J81.0] 06/11/2017  . Depression with anxiety [F41.8] 06/11/2017  . Recurrent mitral valve stenosis and regurgitation s/p mitral valve repair [I05.2]   . Acute pulmonary edema (HCC) [J81.0] 10/10/2016  . CAP (community acquired pneumonia) [J18.9] 05/25/2016  . Leukocytosis [D72.829] 05/24/2016  . Acute respiratory failure with hypoxia (HCC) [J96.01] 05/23/2016  . Acute on chronic diastolic CHF (congestive heart failure) (HCC) [I50.33] 05/23/2016  . Cough with hemoptysis [R04.2] 05/23/2016  . Postpartum complication pericarditis in 2009 with eventual needing MVR in 2015 [O90.89] 05/23/2016  . Anemia, iron deficiency [D50.9] 05/23/2016  . Hypertension [I10]   . SOB (shortness of breath) [R06.02]   . Respiratory distress [R06.03] 02/16/2016  . Essential hypertension [I10] 02/16/2016  . S/P mitral valve repair [Z98.890] 03/27/2013    Total Time spent with patient: 20 minutes  Musculoskeletal: Strength & Muscle Tone: within normal limits Gait & Station: normal Patient leans: N/A  Psychiatric Specialty Exam: Review of Systems  All other systems reviewed and are negative.    Blood pressure 137/81, pulse 92, temperature 98.4 F (36.9 C), temperature source Oral, resp. rate (!) 24, height 5\' 4"  (1.626 m), weight 100.7 kg (222 lb), SpO2 100 %.Body mass index is 38.11 kg/m.  General Appearance: Casual  Eye Contact::  Good  Speech:  Normal Rate409  Volume:  Normal  Mood:  Euthymic  Affect:  Congruent  Thought Process:  Coherent  Orientation:  Full (Time, Place, and Person)  Thought Content:  Logical  Suicidal Thoughts:  No  Homicidal Thoughts:  No  Memory:  Immediate;   Fair Recent;   Fair Remote;   Fair  Judgement:  Intact  Insight:  Fair  Psychomotor Activity:  Normal  Concentration:  Good  Recall:  Good  Fund of Knowledge:Good  Language: Good  Akathisia:  Negative  Handed:  Right  AIMS (if indicated):     Assets:  Communication Skills Desire for Improvement Financial Resources/Insurance Housing Resilience Social Support Talents/Skills  Sleep:  Number of Hours: 6.75  Cognition: WNL  ADL's:  Intact   Mental Status Per Nursing Assessment::   On Admission:  Suicidal ideation indicated by patient, Self-harm thoughts, Intention to act on suicide plan  Demographic Factors:  Low socioeconomic status and Unemployed  Loss Factors: NA  Historical Factors: Impulsivity  Risk Reduction Factors:   Sense of responsibility to family, Living with another person, especially a relative and Positive coping skills or problem solving skills  Continued Clinical Symptoms:  Depression:   Impulsivity  Cognitive Features That Contribute To Risk:  None    Suicide Risk:  Minimal: No identifiable suicidal ideation.  Patients presenting with no risk factors but with morbid ruminations; may be classified as minimal risk based on the severity of the depressive symptoms  Follow-up  Information    [facility], [facility]. Go on 04/19/2018.   Why:  Appointment for medication management services is Wednesday, 04/19/18 at 10:00am. Please be sure to bring your  Photo ID, SS card, proof of household income and any discharge paperwork from this hospitalization.  Contact information: [address] [place] [address] [phone]        [facility]. Go on 04/25/2018.   Why:  Appointment for therapy services is Tuesday, 04/25/18 at 2:00pm. Please be sure to bring any discharge paperwork from this hospitalization.  Contact information: [address]  [place] [phone]          Plan Of Care/Follow-up recommendations:  Activity:  ad lib  Antonieta Pert, MD 04/15/2018, 8:11 AM

## 2018-04-15 NOTE — Progress Notes (Signed)
D: Pt was at the nurse's station upon initial approach.  Pt presents with anxious affect and mood.  She describes her day as "okay" and reports goal is to "work on my anxiety."  Pt reports she is not as anxious as she was earlier today.  She is pleasant during interactions with Clinical research associatewriter.  Pt denies SI/HI, denies hallucinations, denies pain.  Pt has been visible in milieu.  She discussed how peers have negative attitude and "drama" and pt was encouraged to focus on her own treatment.    A: Introduced self to pt.  Actively listened to pt and offered support and encouragement. Medications administered per order.  PRN medication administered for anxiety.  Q15 minute safety checks maintained.  R: Pt is safe on the unit.  Pt is compliant with medications.  Pt verbally contracts for safety.  Will continue to monitor and assess.

## 2018-04-24 ENCOUNTER — Encounter: Payer: Medicaid Other | Admitting: Thoracic Surgery (Cardiothoracic Vascular Surgery)

## 2018-04-28 NOTE — Telephone Encounter (Signed)
Left message to call back. Will arrange OV with APP to update H&P and to schedule L&R heart cath.

## 2018-05-04 ENCOUNTER — Encounter: Payer: Self-pay | Admitting: Cardiovascular Disease

## 2018-05-04 NOTE — Telephone Encounter (Signed)
New Message   Patient is calling because she needs to reschedule a heart cath. She states that she was advised to speak with Karsten FellsKaty Kemp. Please call to discuss.

## 2018-05-04 NOTE — Telephone Encounter (Signed)
Scheduled patient for evaluation on 8/27 with Cleotis NipperJ. Hammond to update H&P, obtain lab work, and schedule R/L heart cath with Dr. Excell Seltzerooper (for Dr. Cornelius Moraswen).

## 2018-05-04 NOTE — Telephone Encounter (Signed)
This encounter was created in error - please disregard.

## 2018-05-09 ENCOUNTER — Ambulatory Visit: Payer: Medicaid Other | Admitting: Cardiology

## 2018-05-09 NOTE — Progress Notes (Deleted)
Cardiology Office Note:    Date:  05/09/2018   ID:  Theresa Crawford, DOB 11/17/1980, MRN [id number]  PCP:  Jerrell Belfast, [name]  Cardiologist:  Prentice Docker, MD  Referring MD: Jerrell Belfast, [name]   No chief complaint on file. ***  History of Present Illness:    Theresa Crawford is a 37 y.o. female with a past medical history significant for chronic diastolic heart failure, mitral valve repair in 2014 now with mod-severe MS, hypertension, palpitations and smoking.  She had admission to the hospital for respiratory distress and decompensation with pulmonary edema in 06/2017 at which time she was taken off Bumex and started on Lasix 80 mg daily.  At follow-up she complained that the Lasix was not keeping her swelling down and she was switched back to Bumex.  She was also noted to have frequent dietary indiscretions.  She was last seen in the office 09/08/2017.  She was admitted to [facility] in May for evaluation of worsening dyspnea and orthopnea.  BNP was 383 and d-dimer was elevated at 2.36.  CTA was negative for PE but did show findings consistent with edema or less likely inflammation.  She was diuresed with IV Lasix and discharged home on torsemide 20 mg daily.  She underwent a TEE as an outpatient for further evaluation of her mitral valve.  This was done on 01/25/2018 and showed a preserved EF of 55-60%, no regional wall motion abnormalities, moderate to severe mitral stenosis and mild MR as well as TR. She was referred to CT surgery.  She was seen by Dr. Cornelius Moras on 03/20/2018 who noted that based on her TEE results he was "not very optimistic that her mitral valve could be repaired a second time and yield a satisfactory long-term result.  Based upon review of the patient's records and her personal account of her struggles over the past few years, it seems clear that she has failed an attempt at medical therapy."  Dr. Cornelius Moras quoted a less than 25% likelihood of successful redo valve  repair.  They discussed the possibility of a mechanical mitral valve replacement requiring lifetime anticoagulation versus a bioprosthetic tissue valve replacement with potential for late structural valve deterioration and failure.  The patient was noted to be interested in proceeding with surgery at some point.  She has undergone dental service consultation and she now needs a left and right heart catheterization.  Theresa Crawford is here today for arrangement of R&L heart cath. (she had been scheduled for cath on 04/13/18 but was hospitalized in behavioral health).   Past Medical History:  Diagnosis Date  . Anxiety   . Arthritis    "lower back" (04/04/2018)  . Chronic bronchitis (HCC)   . Chronic diastolic CHF (congestive heart failure) (HCC)   . Chronic lower back pain   . DDD (degenerative disc disease), lumbar   . Depression   . GERD (gastroesophageal reflux disease)   . Headache    "weekly" (04/04/2018)  . Heart murmur   . Hypertension   . Mitral valve disease   . Normocytic anemia   . Obesity   . Palpitations   . Pericarditis   . Premature atrial contractions   . Pulmonary hypertension (HCC)   . PVC's (premature ventricular contractions)    a. h/o palpitations with event monitor in 03/2017 showing NSR with PACs/PVCs.  . Recurrent mitral valve stenosis and regurgitation s/p mitral valve repair   . S/P mitral valve repair 03/27/2013  [place] complex valvuloplasty including resuspension of entire posterior leaflet using Gore-tex neochords and 30 mm Edwards Physio ring annuloplasty  . Sarcoidosis   . Tobacco abuse   . Tricuspid regurgitation     Past Surgical History:  Procedure Laterality Date  . ABDOMINAL HERNIA REPAIR  ~ 2011  . ABDOMINAL HYSTERECTOMY  2011   "partial"; PID w/problem with fallopian tubes, had left fallopian and left ovary removed, pt still having periods  . CARDIAC CATHETERIZATION  2014  . CESAREAN SECTION  2004; 2009  . HERNIA  REPAIR    . MITRAL VALVE REPAIR  03/27/2013   Dr. Darcus AustinMark [place] - complex valvuloplasty including resuspension of posterior leaflet with 30 mm CE Physio ring annuloplasty  . MULTIPLE EXTRACTIONS WITH ALVEOLOPLASTY N/A 04/03/2018   Procedure: Extraction of tooth #30 with alveoloplasty and gross debridement of remaining teeth;  Surgeon: Charlynne PanderKulinski, Ronald F, DDS;  [facility];  Service: Oral Surgery;  Laterality: N/A;  . PARTIAL HYSTERECTOMY  2011   PID w/problem with fallopian tubes, had left fallopian and left ovary removed, pt still having periods  . TEE WITHOUT CARDIOVERSION N/A 05/26/2016   Procedure: TRANSESOPHAGEAL ECHOCARDIOGRAM (TEE);  Surgeon: Laqueta LindenSuresh A Koneswaran, MD;  Location: [facility];  Service: Cardiovascular;  Laterality: N/A;  . TEE WITHOUT CARDIOVERSION N/A 01/25/2018   Procedure: TRANSESOPHAGEAL ECHOCARDIOGRAM (TEE) WITH PROPOFOL;  Surgeon: Jonelle SidleMcDowell, Samuel G, MD;  Location: [facility];  Service: Cardiovascular;  Laterality: N/A;    Current Medications: No outpatient medications have been marked as taking for the 05/09/18 encounter (Appointment) with Berton BonHammond, Ciearra Rufo, NP.     Allergies:   Bee venom; Lisinopril; Penicillins; Perflutren lipid microsphere; Shellfish allergy; and Contrast media [iodinated diagnostic agents]   Social History   Socioeconomic History  . Marital status: Single    Spouse name: Not on file  . Number of children: 2  . Years of education: Not on file  . Highest education level: Not on file  Occupational History  . Not on file  Social Needs  . Financial resource strain: Not on file  . Food insecurity:    Worry: Not on file    Inability: Not on file  . Transportation needs:    Medical: Not on file    Non-medical: Not on file  Tobacco Use  . Smoking status: Current Some Day Smoker    Packs/day: 0.50    Years: 24.00    Pack years: 12.00    Types: Cigarettes    Start date: 11/03/1999    Last attempt to quit: 03/20/2018      Years since quitting: 0.1  . Smokeless tobacco: Never Used  Substance and Sexual Activity  . Alcohol use: Yes    Comment: 04/04/2018 "1-2 beers/month; if that"  . Drug use: Not Currently  . Sexual activity: Not Currently  Lifestyle  . Physical activity:    Days per week: Not on file    Minutes per session: Not on file  . Stress: Not on file  Relationships  . Social connections:    Talks on phone: Not on file    Gets together: Not on file    Attends religious service: Not on file    Active member of club or organization: Not on file    Attends meetings of clubs or organizations: Not on file    Relationship status: Not on file  Other Topics Concern  . Not on file  Social History Narrative  .  Not on file     Family History: The patient's ***family history includes Heart disease in her paternal grandfather; Heart failure in her father. ROS:   Please see the history of present illness.    *** All other systems reviewed and are negative.  EKGs/Labs/Other Studies Reviewed:    The following studies were reviewed today:  TEE 01/25/18 Study Conclusions - Left ventricle: Systolic function was normal. The estimated   ejection fraction was in the range of 55% to 60%. Wall motion was   normal; there were no regional wall motion abnormalities. - Aorta: Mild atheroschlerotic plaque noted within the aortic arch. - Mitral valve: Moderately thickened leaflets with mild tethering   of the anterior leaflet and restricted motion of the posterior   leaflet. Chordae appear thickened and shortened as well. Annular   ring present. The findings are consistent with moderate to severe   stenosis. There was mild regurgitation directed eccentrically.   Mean gradient (D): 9 mm Hg. Planimetered valve area: 1.39 cm^2. - Left atrium: The atrium was dilated. There was moderatecontinuous   spontaneous echo contrast (&quot;smoke&quot;) in the appendage. Emptying   velocity was severely reduced. -  Right ventricle: The cavity size was mildly dilated. - Right atrium: The atrium was dilated. - Atrial septum: No defect or patent foramen ovale was identified.   Few isolated bubbles noted in left atrium after 4-5 beats. - Tricuspid valve: There was moderate regurgitation. Peak RV-RA   gradient (S): 52 mm Hg. - Pulmonary arteries: Systolic pressure was not measured directly,   but based on RVSP is moderately to severely increased. - Pericardium, extracardiac: There was no pericardial effusion.   EKG:  EKG is *** ordered today.  The ekg ordered today demonstrates ***  Recent Labs: 07/06/2017: ALT 9 03/07/2018: B Natriuretic Peptide 333.0 03/08/2018: Magnesium 2.2 04/09/2018: Hemoglobin 9.3; Platelets 470 04/14/2018: BUN 16; Creatinine, Ser 0.81; Potassium 3.9; Sodium 146; TSH 3.962   Recent Lipid Panel No results found for: CHOL, TRIG, HDL, CHOLHDL, VLDL, LDLCALC, LDLDIRECT  Physical Exam:    VS:  There were no vitals taken for this visit.    Wt Readings from Last 3 Encounters:  04/05/18 226 lb 11.2 oz (102.8 kg)  03/31/18 217 lb (98.4 kg)  03/20/18 218 lb (98.9 kg)     Physical Exam***   ASSESSMENT:    1. Recurrent mitral valve stenosis and regurgitation s/p mitral valve repair   2. Chronic diastolic congestive heart failure (HCC)   3. Essential hypertension   4. Tobacco abuse    PLAN:    In order of problems listed above:  Mitral stenosis: s/p mitral valve repair in 2014 now with more frequent heart failure exacerbations and found to have mod-severe MS and mild MR. Evaluated by Dr. Cornelius Moras with plan for surgical MV replacement. Here today for evaluation and plan for R&L heart cath.   The patient understands that risks included but are not limited to stroke (1 in 1000), death (1 in 1000), kidney failure [usually temporary] (1 in 500), bleeding (1 in 200), allergic reaction [possibly serious] (1 in 200).   Chronic diastolic CHF  Hypertension  Tobacco  abuse  Medication Adjustments/Labs and Tests Ordered: Current medicines are reviewed at length with the patient today.  Concerns regarding medicines are outlined above. Labs and tests ordered and medication changes are outlined in the patient instructions below:  There are no Patient Instructions on file for this visit.   Signed, Berton Bon, NP  05/09/2018 7:59 AM  [facility]

## 2018-05-18 ENCOUNTER — Encounter: Payer: Self-pay | Admitting: Cardiology

## 2018-07-09 ENCOUNTER — Other Ambulatory Visit: Payer: Self-pay

## 2018-07-09 ENCOUNTER — Emergency Department (HOSPITAL_COMMUNITY): Payer: Medicaid Other

## 2018-07-09 ENCOUNTER — Encounter (HOSPITAL_COMMUNITY): Payer: Self-pay | Admitting: Emergency Medicine

## 2018-07-09 ENCOUNTER — Emergency Department (HOSPITAL_COMMUNITY)
Admission: EM | Admit: 2018-07-09 | Discharge: 2018-07-09 | Disposition: A | Discharge status: Home / Self Care | Payer: Medicaid Other | Attending: Emergency Medicine | Admitting: Emergency Medicine

## 2018-07-09 DIAGNOSIS — Z79899 Other long term (current) drug therapy: Secondary | ICD-10-CM | POA: Diagnosis not present

## 2018-07-09 DIAGNOSIS — N390 Urinary tract infection, site not specified: Secondary | ICD-10-CM | POA: Diagnosis not present

## 2018-07-09 DIAGNOSIS — I5032 Chronic diastolic (congestive) heart failure: Secondary | ICD-10-CM | POA: Insufficient documentation

## 2018-07-09 DIAGNOSIS — M5441 Lumbago with sciatica, right side: Secondary | ICD-10-CM | POA: Diagnosis not present

## 2018-07-09 DIAGNOSIS — M545 Low back pain: Secondary | ICD-10-CM | POA: Diagnosis present

## 2018-07-09 DIAGNOSIS — I110 Hypertensive heart disease with heart failure: Secondary | ICD-10-CM | POA: Diagnosis not present

## 2018-07-09 DIAGNOSIS — F17210 Nicotine dependence, cigarettes, uncomplicated: Secondary | ICD-10-CM | POA: Diagnosis not present

## 2018-07-09 LAB — URINALYSIS, ROUTINE W REFLEX MICROSCOPIC
Bacteria, UA: NONE SEEN
Bilirubin Urine: NEGATIVE
Glucose, UA: NEGATIVE mg/dL
Ketones, ur: NEGATIVE mg/dL
Nitrite: NEGATIVE
Protein, ur: 30 mg/dL — AB
Specific Gravity, Urine: 1.024 (ref 1.005–1.030)
WBC, UA: >50 WBC/hpf — ABNORMAL HIGH (ref 0–5)
pH: 6 (ref 5.0–8.0)

## 2018-07-09 LAB — COMPREHENSIVE METABOLIC PANEL
ALT: 11 U/L (ref 0–44)
AST: 17 U/L (ref 15–41)
Albumin: 4.1 g/dL (ref 3.5–5.0)
Alkaline Phosphatase: 62 U/L (ref 38–126)
Anion gap: 7 (ref 5–15)
BUN: 14 mg/dL (ref 6–20)
CHLORIDE: 105 mmol/L (ref 98–111)
CO2: 26 mmol/L (ref 22–32)
CREATININE: 0.87 mg/dL (ref 0.44–1.00)
Calcium: 8.7 mg/dL — ABNORMAL LOW (ref 8.9–10.3)
GFR calc Af Amer: >60 mL/min (ref >60)
GLUCOSE: 124 mg/dL — AB (ref 70–99)
Potassium: 3.9 mmol/L (ref 3.5–5.1)
Sodium: 138 mmol/L (ref 135–145)
Total Bilirubin: 0.6 mg/dL (ref 0.3–1.2)
Total Protein: 8 g/dL (ref 6.5–8.1)

## 2018-07-09 LAB — CBC WITH DIFFERENTIAL/PLATELET
Abs Immature Granulocytes: 0.02 K/uL (ref 0.00–0.07)
Basophils Absolute: 0.1 K/uL (ref 0.0–0.1)
Basophils Relative: 1 %
Eosinophils Absolute: 0.1 K/uL (ref 0.0–0.5)
Eosinophils Relative: 2 %
HCT: 34.4 % — ABNORMAL LOW (ref 36.0–46.0)
Hemoglobin: 10.3 g/dL — ABNORMAL LOW (ref 12.0–15.0)
Immature Granulocytes: 0 %
Lymphocytes Relative: 19 %
Lymphs Abs: 1.5 K/uL (ref 0.7–4.0)
MCH: 24.8 pg — ABNORMAL LOW (ref 26.0–34.0)
MCHC: 29.9 g/dL — ABNORMAL LOW (ref 30.0–36.0)
MCV: 82.7 fL (ref 80.0–100.0)
Monocytes Absolute: 0.4 K/uL (ref 0.1–1.0)
Monocytes Relative: 6 %
Neutro Abs: 5.7 K/uL (ref 1.7–7.7)
Neutrophils Relative %: 72 %
Platelets: 372 K/uL (ref 150–400)
RBC: 4.16 MIL/uL (ref 3.87–5.11)
RDW: 18.7 % — ABNORMAL HIGH (ref 11.5–15.5)
WBC: 7.8 K/uL (ref 4.0–10.5)
nRBC: 0 % (ref 0.0–0.2)

## 2018-07-09 LAB — LIPASE, BLOOD: Lipase: 34 U/L (ref 11–51)

## 2018-07-09 LAB — HCG, SERUM, QUALITATIVE: PREG SERUM: NEGATIVE

## 2018-07-09 MED ORDER — DEXAMETHASONE SODIUM PHOSPHATE 4 MG/ML IJ SOLN
8.0000 mg | Freq: Once | INTRAMUSCULAR | Status: AC
  Administered 2018-07-09: 8 mg via INTRAVENOUS
  Filled 2018-07-09: qty 2

## 2018-07-09 MED ORDER — MORPHINE SULFATE (PF) 4 MG/ML IV SOLN
4.0000 mg | Freq: Once | INTRAVENOUS | Status: AC
  Administered 2018-07-09: 4 mg via INTRAVENOUS
  Filled 2018-07-09: qty 1

## 2018-07-09 MED ORDER — KETOROLAC TROMETHAMINE 30 MG/ML IJ SOLN
15.0000 mg | Freq: Once | INTRAMUSCULAR | Status: AC
  Administered 2018-07-09: 15 mg via INTRAVENOUS
  Filled 2018-07-09: qty 1

## 2018-07-09 MED ORDER — SODIUM CHLORIDE 0.9 % IV BOLUS
500.0000 mL | Freq: Once | INTRAVENOUS | Status: AC
  Administered 2018-07-09: 500 mL via INTRAVENOUS

## 2018-07-09 MED ORDER — DICLOFENAC SODIUM 50 MG PO TBEC: 50.0000 mg | DELAYED_RELEASE_TABLET | Freq: Two times a day (BID) | ORAL | 0 refills | Status: DC

## 2018-07-09 MED ORDER — NITROFURANTOIN MONOHYD MACRO 100 MG PO CAPS: 100.0000 mg | ORAL_CAPSULE | Freq: Two times a day (BID) | ORAL | 0 refills | Status: DC

## 2018-07-09 MED ORDER — CYCLOBENZAPRINE HCL 10 MG PO TABS: 10.0000 mg | ORAL_TABLET | Freq: Two times a day (BID) | ORAL | 0 refills | Status: DC | PRN

## 2018-07-09 MED ORDER — KETOROLAC TROMETHAMINE 30 MG/ML IJ SOLN
30.0000 mg | Freq: Once | INTRAMUSCULAR | Status: AC
  Administered 2018-07-09: 30 mg via INTRAVENOUS
  Filled 2018-07-09: qty 1

## 2018-07-09 NOTE — ED Notes (Signed)
Pt continues to request food  Also asks if Dr Adriana Simas is working

## 2018-07-09 NOTE — Discharge Instructions (Addendum)
X-ray shows degeneration at the L4-5 area.  Prescription for Flexeril and Voltaren.  Additionally, you have a urinary tract infection.  Antibiotic for same.  Increase fluids.  Follow-up with your primary care doctor

## 2018-07-09 NOTE — ED Notes (Signed)
IV attempted  Flush but could not advance Pt yelling while inserting and removing

## 2018-07-09 NOTE — ED Notes (Signed)
To Rad 

## 2018-07-09 NOTE — ED Triage Notes (Signed)
PT c/o vaginal bleeding with lower abdominal pain that started on 06/29/18 with continued lower back pain. PT also c/o productive clear sputum cough x4 days. PT states she is due for another heart surgery also and concerned about her CHF.

## 2018-07-09 NOTE — ED Notes (Signed)
Out of bed to BR  Very dramatic with movements and facial expressions  Request food  Drink and pain meds

## 2018-07-09 NOTE — ED Notes (Signed)
Pt continues to ask for food

## 2018-07-09 NOTE — ED Provider Notes (Signed)
[facility] EMERGENCY DEPARTMENT Provider Note   CSN: [id number] Arrival date & time: 07/09/18  1213     History   Chief Complaint Chief Complaint  Patient presents with  . Abdominal Pain    HPI LESSLY STIGLER is a 37 y.o. female.  Patient presents with lower back pain right greater than left with radiation to the right buttocks.  She has a known history of degenerative disc disease in the lumbar spine.  No bowel or bladder incontinence.  Other health problems include congestive heart failure, pulmonary hypertension, sarcoidosis, several others.  Severity of pain is moderate.  She has taken ibuprofen at home with minimal success.     Past Medical History:  Diagnosis Date  . Anxiety   . Arthritis    "lower back" (04/04/2018)  . Chronic bronchitis (HCC)   . Chronic diastolic CHF (congestive heart failure) (HCC)   . Chronic lower back pain   . DDD (degenerative disc disease), lumbar   . Depression   . GERD (gastroesophageal reflux disease)   . Headache    "weekly" (04/04/2018)  . Heart murmur   . Hypertension   . Mitral valve disease   . Normocytic anemia   . Obesity   . Palpitations   . Pericarditis   . Premature atrial contractions   . Pulmonary hypertension (HCC)   . PVC's (premature ventricular contractions)    a. h/o palpitations with event monitor in 03/2017 showing NSR with PACs/PVCs.  . Recurrent mitral valve stenosis and regurgitation s/p mitral valve repair   . S/P mitral valve repair 03/27/2013   Dr. Darcus Austin - [place], [place] - complex valvuloplasty including resuspension of entire posterior leaflet using Gore-tex neochords and 30 mm Edwards Physio ring annuloplasty  . Sarcoidosis   . Tobacco abuse   . Tricuspid regurgitation     Patient Active Problem List   Diagnosis Date Noted  . MDD (major depressive disorder), recurrent episode (HCC) 04/05/2018  . S/P tooth extraction 04/03/2018  . Suicidal ideation 04/03/2018  . MDD (major depressive  disorder)   . Chronic diastolic congestive heart failure (HCC)   . Tricuspid regurgitation   . Hypomagnesemia 03/07/2018  . Tobacco abuse   . Dyspnea 01/11/2018  . Prolonged QT interval 09/10/2017  . Normochromic normocytic anemia   . Pulmonary edema 07/05/2017  . Hypokalemia 06/12/2017  . Pulmonary hypertension (HCC) 06/12/2017  . Flash pulmonary edema (HCC) 06/11/2017  . Depression with anxiety 06/11/2017  . Recurrent mitral valve stenosis and regurgitation s/p mitral valve repair   . Acute pulmonary edema (HCC) 10/10/2016  . CAP (community acquired pneumonia) 05/25/2016  . Leukocytosis 05/24/2016  . Acute respiratory failure with hypoxia (HCC) 05/23/2016  . Acute on chronic diastolic CHF (congestive heart failure) (HCC) 05/23/2016  . Cough with hemoptysis 05/23/2016  . Postpartum complication pericarditis in 2009 with eventual needing MVR in 2015 05/23/2016  . Anemia, iron deficiency 05/23/2016  . Hypertension   . SOB (shortness of breath)   . Respiratory distress 02/16/2016  . Essential hypertension 02/16/2016  . S/P mitral valve repair 03/27/2013    Past Surgical History:  Procedure Laterality Date  . ABDOMINAL HERNIA REPAIR  ~ 2011  . ABDOMINAL HYSTERECTOMY  2011   "partial"; PID w/problem with fallopian tubes, had left fallopian and left ovary removed, pt still having periods  . CARDIAC CATHETERIZATION  2014  . CESAREAN SECTION  2004; 2009  . HERNIA REPAIR    . MITRAL VALVE REPAIR  03/27/2013   Dr. Loraine Leriche  [place] - complex valvuloplasty including resuspension of posterior leaflet with 30 mm CE Physio ring annuloplasty  . MULTIPLE EXTRACTIONS WITH ALVEOLOPLASTY N/A 04/03/2018   Procedure: Extraction of tooth #30 with alveoloplasty and gross debridement of remaining teeth;  Surgeon: Charlynne Pander, DDS;  Location: [facility] OR;  Service: Oral Surgery;  Laterality: N/A;  . PARTIAL HYSTERECTOMY  2011   PID w/problem with fallopian tubes, had left fallopian  and left ovary removed, pt still having periods  . TEE WITHOUT CARDIOVERSION N/A 05/26/2016   Procedure: TRANSESOPHAGEAL ECHOCARDIOGRAM (TEE);  Surgeon: [name], MD;  Location: [facility];  Service: Cardiovascular;  Laterality: N/A;  . TEE WITHOUT CARDIOVERSION N/A 01/25/2018   Procedure: TRANSESOPHAGEAL ECHOCARDIOGRAM (TEE) WITH PROPOFOL;  Surgeon: Jonelle Sidle, MD;  Location: [facility];  Service: Cardiovascular;  Laterality: N/A;     OB History    Gravida  2   Para  2   Term  2   Preterm      AB      Living        SAB      TAB      Ectopic      Multiple      Live Births               Home Medications    Prior to Admission medications   Medication Sig Start Date End Date Taking? Authorizing Provider  albuterol (PROVENTIL HFA;VENTOLIN HFA) 108 (90 Base) MCG/ACT inhaler Inhale 2 puffs into the lungs every 6 (six) hours as needed for wheezing or shortness of breath.   Yes [provider]  docusate sodium (COLACE) 100 MG capsule Take 100 mg by mouth daily as needed for moderate constipation.    Yes [provider]  EPINEPHrine (EPIPEN 2-PAK) 0.3 mg/0.3 mL IJ SOAJ injection Inject 0.3 Units into the muscle once.    Yes [provider]  ferrous sulfate 325 (65 FE) MG tablet Take 1 tablet (325 mg total) by mouth 2 (two) times daily with a meal. Patient taking differently: Take 325 mg by mouth daily with breakfast.  03/08/18  Yes Philip Aspen, Limmie Patricia, MD  furosemide (LASIX) 80 MG tablet Take 80 mg by mouth daily. 06/29/18  Yes [provider]  LORazepam (ATIVAN) 0.5 MG tablet Take 1 tablet (0.5 mg total) by mouth every 6 (six) hours as needed for anxiety or sleep. 04/15/18  Yes Oneta Rack, NP  metoprolol succinate (TOPROL-XL) 25 MG 24 hr tablet Take 25 mg by mouth daily. 06/29/18  Yes [provider]  Probiotic Product (PROBIOTIC PO) Take 1 capsule by mouth daily.   Yes [provider]    QUEtiapine (SEROQUEL) 50 MG tablet Take 1 tablet (50 mg total) by mouth at bedtime. 04/15/18  Yes Oneta Rack, NP  sertraline (ZOLOFT) 100 MG tablet Take 1 tablet (100 mg total) by mouth daily. 04/16/18  Yes Oneta Rack, NP  traZODone (DESYREL) 50 MG tablet Take 0.5 tablets (25 mg total) by mouth at bedtime. Patient taking differently: Take 25-50 mg by mouth at bedtime.  04/15/18  Yes Oneta Rack, NP  cyclobenzaprine (FLEXERIL) 10 MG tablet Take 1 tablet (10 mg total) by mouth 2 (two) times daily as needed for muscle spasms. 07/09/18   Donnetta Hutching, MD  diclofenac (VOLTAREN) 50 MG EC tablet Take 1 tablet (50 mg total) by mouth 2 (two) times daily. 07/09/18   Donnetta Hutching, MD  nicotine  polacrilex (NICORETTE) 2 MG gum Take 1 each (2 mg total) by mouth as needed for smoking cessation. Patient not taking: Reported on 07/09/2018 04/15/18   Oneta Rack, NP  nitrofurantoin, macrocrystal-monohydrate, (MACROBID) 100 MG capsule Take 1 capsule (100 mg total) by mouth 2 (two) times daily. 07/09/18   Donnetta Hutching, MD    Family History Family History  Problem Relation Age of Onset  . Heart failure Father        just received LVAD  . Heart disease Paternal Grandfather        stent placement    Social History Social History   Tobacco Use  . Smoking status: Current Some Day Smoker    Packs/day: 0.25    Years: 24.00    Pack years: 6.00    Types: Cigarettes    Start date: 11/03/1999  . Smokeless tobacco: Never Used  Substance Use Topics  . Alcohol use: Yes    Comment: 04/04/2018 "1-2 beers/month; if that"  . Drug use: Not Currently     Allergies   Bee venom; Lisinopril; Penicillins; Perflutren lipid microsphere; Shellfish allergy; and Contrast media [iodinated diagnostic agents]   Review of Systems Review of Systems  All other systems reviewed and are negative.    Physical Exam Updated Vital Signs BP 101/66   Pulse 63   Temp (!) 97.5 F (36.4 C) (Temporal)   Resp (!) 24   Ht  5\' 4"  (1.626 m)   Wt 100.2 kg   LMP 06/29/2018   SpO2 98%   BMI 37.93 kg/m   Physical Exam  Constitutional: She is oriented to person, place, and time. She appears well-developed and well-nourished.  HENT:  Head: Normocephalic and atraumatic.  Eyes: Conjunctivae are normal.  Neck: Neck supple.  Cardiovascular: Normal rate and regular rhythm.  Pulmonary/Chest: Effort normal and breath sounds normal.  Abdominal: Soft. Bowel sounds are normal.  Musculoskeletal: Normal range of motion.  Minimal tenderness right lumbar spine in the L3-4-5 area.  Pain with straight leg raise right greater than left.  Neurological: She is alert and oriented to person, place, and time.  Skin: Skin is warm and dry.  Psychiatric: She has a normal mood and affect. Her behavior is normal.  Nursing note and vitals reviewed.    ED Treatments / Results  Labs (all labs ordered are listed, but only abnormal results are displayed) Labs Reviewed  URINALYSIS, ROUTINE W REFLEX MICROSCOPIC - Abnormal; Notable for the following components:      Result Value   Hgb urine dipstick LARGE (*)    Protein, ur 30 (*)    Leukocytes, UA SMALL (*)    WBC, UA >50 (*)    All other components within normal limits  CBC WITH DIFFERENTIAL/PLATELET - Abnormal; Notable for the following components:   Hemoglobin 10.3 (*)    HCT 34.4 (*)    MCH 24.8 (*)    MCHC 29.9 (*)    RDW 18.7 (*)    All other components within normal limits  COMPREHENSIVE METABOLIC PANEL - Abnormal; Notable for the following components:   Glucose, Bld 124 (*)    Calcium 8.7 (*)    All other components within normal limits  LIPASE, BLOOD  HCG, SERUM, QUALITATIVE    EKG None  Radiology Dg Lumbar Spine Complete  Result Date: 07/09/2018 CLINICAL DATA:  LOW BACK PAIN RADIATE TO RIGHT BUTTOCK, PER ER NOTE, PATIENT STATES " BACK PAIN, HX OF DDD, HAS NOT TAKEN HER DIURECTIC TODAY BECAUSE IT HURTS  TO WALK, CAN'T GET UP TO PEE AT THIS TIME EXAM: LUMBAR  SPINE - COMPLETE 4+ VIEW COMPARISON:  CT, 03/30/2018 FINDINGS: No fracture or bone lesion.  No spondylolisthesis. There are transitional thoracolumbar and lumbosacral vertebra. Mild narrowing of the L3-L4 disc space. Moderate loss of disc height at L4-L5 with mild to moderate loss of disc height at L5-S1. Remaining discs and facet joints are well preserved. Soft tissues are unremarkable. IMPRESSION: 1. No fracture, spondylolisthesis or acute finding. 2. Disc degenerative changes most prominent at L4-L5, stable compared to the prior CT scan. Electronically Signed   By: Amie Portland M.D.   On: 07/09/2018 16:48    Procedures Procedures (including critical care time)  Medications Ordered in ED Medications  ketorolac (TORADOL) 30 MG/ML injection 15 mg (has no administration in time range)  sodium chloride 0.9 % bolus 500 mL (0 mLs Intravenous Stopped 07/09/18 1751)  morphine 4 MG/ML injection 4 mg (4 mg Intravenous Given 07/09/18 1623)  dexamethasone (DECADRON) injection 8 mg (8 mg Intravenous Given 07/09/18 1622)  ketorolac (TORADOL) 30 MG/ML injection 30 mg (30 mg Intravenous Given 07/09/18 1624)     Initial Impression / Assessment and Plan / ED Course  I have reviewed the triage vital signs and the nursing notes.  Pertinent labs & imaging results that were available during my care of the patient were reviewed by me and considered in my medical decision making (see chart for details).     Patient has known lumbar degenerative disc disease.  She now complains of pain in the lower back rating to the right buttocks.  Plain films reveal stable L4-5 disc degeneration.  Urinalysis grossly infected.  No evidence of pyelonephritis.  Patient given IV Toradol, morphine, Decadron in the ED.  Discharge medications Voltaren 50 mg, Flexeril 10 mg, Macrobid  Final Clinical Impressions(s) / ED Diagnoses   Final diagnoses:  Acute right-sided low back pain with right-sided sciatica  Urinary tract infection  without hematuria, site unspecified    ED Discharge Orders         Ordered    diclofenac (VOLTAREN) 50 MG EC tablet  2 times daily     07/09/18 1748    cyclobenzaprine (FLEXERIL) 10 MG tablet  2 times daily PRN     07/09/18 1748    nitrofurantoin, macrocrystal-monohydrate, (MACROBID) 100 MG capsule  2 times daily     07/09/18 1752           Donnetta Hutching, MD 07/09/18 1806

## 2018-07-09 NOTE — ED Notes (Signed)
p t reports back pain - has hx of DDD  Also has not taken her diuretic today because it hurts to walk and  She "can't get up to pee all the time"  Pt request food as she reports she is hungry Has difficulty getting from /WC to bed   Also reports that she wonders if she is retaining fluid (she has not had her diuretic today)

## 2018-07-21 ENCOUNTER — Other Ambulatory Visit: Payer: Self-pay | Admitting: Adult Health

## 2018-07-26 ENCOUNTER — Emergency Department (HOSPITAL_COMMUNITY)
Admission: EM | Admit: 2018-07-26 | Discharge: 2018-07-26 | Disposition: A | Discharge status: Home / Self Care | Payer: Medicaid Other | Attending: Emergency Medicine | Admitting: Emergency Medicine

## 2018-07-26 ENCOUNTER — Emergency Department (HOSPITAL_COMMUNITY): Payer: Medicaid Other

## 2018-07-26 ENCOUNTER — Encounter (HOSPITAL_COMMUNITY): Payer: Self-pay | Admitting: *Deleted

## 2018-07-26 ENCOUNTER — Other Ambulatory Visit: Payer: Self-pay

## 2018-07-26 DIAGNOSIS — Z955 Presence of coronary angioplasty implant and graft: Secondary | ICD-10-CM | POA: Insufficient documentation

## 2018-07-26 DIAGNOSIS — I110 Hypertensive heart disease with heart failure: Secondary | ICD-10-CM | POA: Diagnosis not present

## 2018-07-26 DIAGNOSIS — F17210 Nicotine dependence, cigarettes, uncomplicated: Secondary | ICD-10-CM | POA: Diagnosis not present

## 2018-07-26 DIAGNOSIS — Z79899 Other long term (current) drug therapy: Secondary | ICD-10-CM | POA: Diagnosis not present

## 2018-07-26 DIAGNOSIS — R0600 Dyspnea, unspecified: Secondary | ICD-10-CM | POA: Diagnosis present

## 2018-07-26 DIAGNOSIS — I5023 Acute on chronic systolic (congestive) heart failure: Secondary | ICD-10-CM | POA: Insufficient documentation

## 2018-07-26 LAB — CBC WITH DIFFERENTIAL/PLATELET
ABS IMMATURE GRANULOCYTES: 0.03 10*3/uL (ref 0.00–0.07)
Basophils Absolute: 0.1 10*3/uL (ref 0.0–0.1)
Basophils Relative: 1 %
Eosinophils Absolute: 0.1 10*3/uL (ref 0.0–0.5)
Eosinophils Relative: 1 %
HEMATOCRIT: 30.7 % — AB (ref 36.0–46.0)
HEMOGLOBIN: 9.1 g/dL — AB (ref 12.0–15.0)
IMMATURE GRANULOCYTES: 0 %
LYMPHS PCT: 19 %
Lymphs Abs: 1.7 10*3/uL (ref 0.7–4.0)
MCH: 24.8 pg — ABNORMAL LOW (ref 26.0–34.0)
MCHC: 29.6 g/dL — ABNORMAL LOW (ref 30.0–36.0)
MCV: 83.7 fL (ref 80.0–100.0)
MONO ABS: 0.3 10*3/uL (ref 0.1–1.0)
MONOS PCT: 4 %
NEUTROS ABS: 6.8 10*3/uL (ref 1.7–7.7)
NEUTROS PCT: 75 %
Platelets: 352 10*3/uL (ref 150–400)
RBC: 3.67 MIL/uL — ABNORMAL LOW (ref 3.87–5.11)
RDW: 18.6 % — AB (ref 11.5–15.5)
WBC: 9 10*3/uL (ref 4.0–10.5)
nRBC: 0 % (ref 0.0–0.2)

## 2018-07-26 LAB — COMPREHENSIVE METABOLIC PANEL
ALBUMIN: 3.5 g/dL (ref 3.5–5.0)
ALK PHOS: 61 U/L (ref 38–126)
ALT: 11 U/L (ref 0–44)
ANION GAP: 6 (ref 5–15)
AST: 18 U/L (ref 15–41)
BILIRUBIN TOTAL: 0.8 mg/dL (ref 0.3–1.2)
BUN: 13 mg/dL (ref 6–20)
CALCIUM: 8.4 mg/dL — AB (ref 8.9–10.3)
CO2: 21 mmol/L — ABNORMAL LOW (ref 22–32)
Chloride: 111 mmol/L (ref 98–111)
Creatinine, Ser: 0.86 mg/dL (ref 0.44–1.00)
GFR calc non Af Amer: >60 mL/min (ref >60)
GLUCOSE: 94 mg/dL (ref 70–99)
Potassium: 3.7 mmol/L (ref 3.5–5.1)
SODIUM: 138 mmol/L (ref 135–145)
TOTAL PROTEIN: 7.1 g/dL (ref 6.5–8.1)

## 2018-07-26 LAB — TROPONIN I: Troponin I: <0.03 ng/mL (ref <0.03)

## 2018-07-26 LAB — BRAIN NATRIURETIC PEPTIDE: B NATRIURETIC PEPTIDE 5: 420 pg/mL — AB (ref 0.0–100.0)

## 2018-07-26 MED ORDER — FUROSEMIDE 10 MG/ML IJ SOLN
INTRAMUSCULAR | Status: AC
  Filled 2018-07-26: qty 20

## 2018-07-26 MED ORDER — FUROSEMIDE 10 MG/ML IJ SOLN
120.0000 mg | Freq: Once | INTRAVENOUS | Status: AC
  Administered 2018-07-26: 120 mg via INTRAVENOUS
  Filled 2018-07-26: qty 12

## 2018-07-26 MED ORDER — ALBUTEROL SULFATE (2.5 MG/3ML) 0.083% IN NEBU
5.0000 mg | INHALATION_SOLUTION | Freq: Once | RESPIRATORY_TRACT | Status: AC
  Administered 2018-07-26: 5 mg via RESPIRATORY_TRACT
  Filled 2018-07-26: qty 6

## 2018-07-26 MED ORDER — FUROSEMIDE 80 MG PO TABS: 80.0000 mg | ORAL_TABLET | Freq: Every day | ORAL | 0 refills | Status: DC

## 2018-07-26 NOTE — ED Notes (Signed)
Nurse advised of pt stating neck pain rated 8 out of 10, IV keeps occluding on pt side

## 2018-07-26 NOTE — Discharge Instructions (Signed)
As discussed, it is important that you monitor your condition carefully, and be sure to follow-up with your cardiology team. For the next 3 days please take 120 mg of Lasix daily, in addition to your other prescribed medication. Return here for concerning changes.

## 2018-07-26 NOTE — ED Provider Notes (Signed)
[facility] EMERGENCY DEPARTMENT Provider Note   CSN: [id number] Arrival date & time: 07/26/18  1603     History   Chief Complaint Chief Complaint  Patient presents with  . Shortness of Breath    HPI Theresa Crawford is a 37 y.o. female.  HPI  Presents with dyspnea and chest tightness. Onset was yesterday, without clear precipitant. Since onset symptoms been worse with tightness across that superior chest, dyspnea, chills, but no fever, no vomiting, no abdominal pain. No relief with OTC medication. She notes she takes all of the medication as directed including medication for congestive heart failure. Patient has a history of gestational pericarditis, has known valvular disease, is now status post mitral valve repair.   Past Medical History:  Diagnosis Date  . Anxiety   . Arthritis    "lower back" (04/04/2018)  . Chronic bronchitis (HCC)   . Chronic diastolic CHF (congestive heart failure) (HCC)   . Chronic lower back pain   . DDD (degenerative disc disease), lumbar   . Depression   . GERD (gastroesophageal reflux disease)   . Headache    "weekly" (04/04/2018)  . Heart murmur   . Hypertension   . Mitral valve disease   . Normocytic anemia   . Obesity   . Palpitations   . Pericarditis   . Premature atrial contractions   . Pulmonary hypertension (HCC)   . PVC's (premature ventricular contractions)    a. h/o palpitations with event monitor in 03/2017 showing NSR with PACs/PVCs.  . Recurrent mitral valve stenosis and regurgitation s/p mitral valve repair   . S/P mitral valve repair 03/27/2013   Dr. Darcus Austin - [place], [place] - complex valvuloplasty including resuspension of entire posterior leaflet using Gore-tex neochords and 30 mm Edwards Physio ring annuloplasty  . Sarcoidosis   . Tobacco abuse   . Tricuspid regurgitation     Patient Active Problem List   Diagnosis Date Noted  . MDD (major depressive disorder), recurrent episode (HCC) 04/05/2018  . S/P  tooth extraction 04/03/2018  . Suicidal ideation 04/03/2018  . MDD (major depressive disorder)   . Chronic diastolic congestive heart failure (HCC)   . Tricuspid regurgitation   . Hypomagnesemia 03/07/2018  . Tobacco abuse   . Dyspnea 01/11/2018  . Prolonged QT interval 09/10/2017  . Normochromic normocytic anemia   . Pulmonary edema 07/05/2017  . Hypokalemia 06/12/2017  . Pulmonary hypertension (HCC) 06/12/2017  . Flash pulmonary edema (HCC) 06/11/2017  . Depression with anxiety 06/11/2017  . Recurrent mitral valve stenosis and regurgitation s/p mitral valve repair   . Acute pulmonary edema (HCC) 10/10/2016  . CAP (community acquired pneumonia) 05/25/2016  . Leukocytosis 05/24/2016  . Acute respiratory failure with hypoxia (HCC) 05/23/2016  . Acute on chronic diastolic CHF (congestive heart failure) (HCC) 05/23/2016  . Cough with hemoptysis 05/23/2016  . Postpartum complication pericarditis in 2009 with eventual needing MVR in 2015 05/23/2016  . Anemia, iron deficiency 05/23/2016  . Hypertension   . SOB (shortness of breath)   . Respiratory distress 02/16/2016  . Essential hypertension 02/16/2016  . S/P mitral valve repair 03/27/2013    Past Surgical History:  Procedure Laterality Date  . ABDOMINAL HERNIA REPAIR  ~ 2011  . ABDOMINAL HYSTERECTOMY  2011   "partial"; PID w/problem with fallopian tubes, had left fallopian and left ovary removed, pt still having periods  . CARDIAC CATHETERIZATION  2014  . CESAREAN SECTION  2004; 2009  . HERNIA REPAIR    . MITRAL  VALVE REPAIR  03/27/2013   Dr. Darcus [place] - complex valvuloplasty including resuspension of posterior leaflet with 30 mm CE Physio ring annuloplasty  . MULTIPLE EXTRACTIONS WITH ALVEOLOPLASTY N/A 04/03/2018   Procedure: Extraction of tooth #30 with alveoloplasty and gross debridement of remaining teeth;  Surgeon: Charlynne PanderKulinski, Ronald F, DDS;  [facility];  Service: Oral Surgery;  Laterality: N/A;  .  PARTIAL HYSTERECTOMY  2011   PID w/problem with fallopian tubes, had left fallopian and left ovary removed, pt still having periods  . TEE WITHOUT CARDIOVERSION N/A 05/26/2016   Procedure: TRANSESOPHAGEAL ECHOCARDIOGRAM (TEE);  Surgeon: Laqueta LindenSuresh A Koneswaran, MD;  Location: [facility] SUITE;  Service: Cardiovascular;  Laterality: N/A;  . TEE WITHOUT CARDIOVERSION N/A 01/25/2018   Procedure: TRANSESOPHAGEAL ECHOCARDIOGRAM (TEE) WITH PROPOFOL;  Surgeon: Jonelle SidleMcDowell, Samuel G, MD;  Location: [facility] SUITE;  Service: Cardiovascular;  Laterality: N/A;     OB History    Gravida  2   Para  2   Term  2   Preterm      AB      Living        SAB      TAB      Ectopic      Multiple      Live Births               Home Medications    Prior to Admission medications   Medication Sig Start Date End Date Taking? Authorizing Provider  albuterol (PROVENTIL HFA;VENTOLIN HFA) 108 (90 Base) MCG/ACT inhaler Inhale 2 puffs into the lungs every 6 (six) hours as needed for wheezing or shortness of breath.   Yes [provider]  docusate sodium (COLACE) 100 MG capsule Take 100 mg by mouth daily as needed for moderate constipation.    Yes [provider]  EPINEPHrine (EPIPEN 2-PAK) 0.3 mg/0.3 mL IJ SOAJ injection Inject 0.3 Units into the muscle once.    Yes [provider]  ferrous sulfate 325 (65 FE) MG tablet Take 1 tablet (325 mg total) by mouth 2 (two) times daily with a meal. Patient taking differently: Take 325 mg by mouth daily with breakfast.  03/08/18  Yes Philip AspenHernandez Acosta, Limmie PatriciaEstela Y, MD  furosemide (LASIX) 80 MG tablet Take 80 mg by mouth daily. 06/29/18  Yes [provider]  LORazepam (ATIVAN) 0.5 MG tablet Take 1 tablet (0.5 mg total) by mouth every 6 (six) hours as needed for anxiety or sleep. 04/15/18  Yes Oneta RackLewis, Tanika N, NP  metoprolol succinate (TOPROL-XL) 25 MG 24 hr tablet TAKE 1 AND 1/2 TABLETS BY MOUTH DAILY. Patient taking differently: Take 37.5 mg by  mouth daily.  07/25/18  Yes Jodelle GrossLawrence, Kathryn M, NP  potassium chloride SA (KLOR-CON M20) 20 MEQ tablet Take 20 mEq by mouth 2 (two) times daily.   Yes [provider]  Probiotic Product (PROBIOTIC PO) Take 1 capsule by mouth daily.   Yes [provider]  QUEtiapine (SEROQUEL) 50 MG tablet Take 1 tablet (50 mg total) by mouth at bedtime. 04/15/18  Yes Oneta RackLewis, Tanika N, NP  sertraline (ZOLOFT) 100 MG tablet Take 1 tablet (100 mg total) by mouth daily. 04/16/18  Yes Oneta RackLewis, Tanika N, NP  traZODone (DESYREL) 50 MG tablet Take 0.5 tablets (25 mg total) by mouth at bedtime. Patient taking differently: Take 25-50 mg by mouth at bedtime.  04/15/18  Yes Oneta RackLewis, Tanika N, NP  cyclobenzaprine (FLEXERIL) 10 MG tablet Take 1 tablet (10 mg total)  by mouth 2 (two) times daily as needed for muscle spasms. Patient not taking: Reported on 07/26/2018 07/09/18   Donnetta Hutching, MD  diclofenac (VOLTAREN) 50 MG EC tablet Take 1 tablet (50 mg total) by mouth 2 (two) times daily. Patient not taking: Reported on 07/26/2018 07/09/18   Donnetta Hutching, MD  nicotine polacrilex (NICORETTE) 2 MG gum Take 1 each (2 mg total) by mouth as needed for smoking cessation. Patient not taking: Reported on 07/09/2018 04/15/18   Oneta Rack, NP  nitrofurantoin, macrocrystal-monohydrate, (MACROBID) 100 MG capsule Take 1 capsule (100 mg total) by mouth 2 (two) times daily. Patient not taking: Reported on 07/26/2018 07/09/18   Donnetta Hutching, MD    Family History Family History  Problem Relation Age of Onset  . Heart failure Father        just received LVAD  . Heart disease Paternal Grandfather        stent placement    Social History Social History   Tobacco Use  . Smoking status: Current Some Day Smoker    Packs/day: 0.25    Years: 24.00    Pack years: 6.00    Types: Cigarettes    Start date: 11/03/1999  . Smokeless tobacco: Never Used  Substance Use Topics  . Alcohol use: Yes    Comment: 04/04/2018 "1-2 beers/month;  if that"  . Drug use: Not Currently     Allergies   Bee venom; Lisinopril; Penicillins; Perflutren lipid microsphere; Shellfish allergy; and Contrast media [iodinated diagnostic agents]   Review of Systems Review of Systems  Constitutional:       Per HPI, otherwise negative  HENT:       Per HPI, otherwise negative  Respiratory:       Per HPI, otherwise negative  Cardiovascular:       Per HPI, otherwise negative  Gastrointestinal: Negative for vomiting.  Endocrine:       Negative aside from HPI  Genitourinary:       Neg aside from HPI   Musculoskeletal:       Per HPI, otherwise negative  Skin: Negative.   Neurological: Negative for syncope.     Physical Exam Updated Vital Signs BP 110/72   Pulse 88   Temp 98.6 F (37 C) (Oral)   Resp 14   Ht 5\' 4"  (1.626 m)   Wt 96.6 kg   LMP 06/29/2018   SpO2 96%   BMI 36.56 kg/m   Physical Exam  Constitutional: She is oriented to person, place, and time. She appears well-developed and well-nourished. No distress.  HENT:  Head: Normocephalic and atraumatic.  Eyes: Conjunctivae and EOM are normal.  Cardiovascular: Normal rate and regular rhythm.  Pulmonary/Chest: No stridor. Tachypnea noted. She has wheezes.  Abdominal: She exhibits no distension.  Musculoskeletal: She exhibits no edema.  Neurological: She is alert and oriented to person, place, and time. No cranial nerve deficit.  Skin: Skin is warm and dry.  Psychiatric: She has a normal mood and affect.  Nursing note and vitals reviewed.    ED Treatments / Results  Labs (all labs ordered are listed, but only abnormal results are displayed) Labs Reviewed  COMPREHENSIVE METABOLIC PANEL - Abnormal; Notable for the following components:      Result Value   CO2 21 (*)    Calcium 8.4 (*)    All other components within normal limits  BRAIN NATRIURETIC PEPTIDE - Abnormal; Notable for the following components:   B Natriuretic Peptide 420.0 (*)  All other components  within normal limits  CBC WITH DIFFERENTIAL/PLATELET - Abnormal; Notable for the following components:   RBC 3.67 (*)    Hemoglobin 9.1 (*)    HCT 30.7 (*)    MCH 24.8 (*)    MCHC 29.6 (*)    RDW 18.6 (*)    All other components within normal limits  TROPONIN I  CBC WITH DIFFERENTIAL/PLATELET    EKG EKG Interpretation  Date/Time:  Wednesday July 26 2018 16:08:30 EST Ventricular Rate:  83 PR Interval:    QRS Duration: 77 QT Interval:  398 QTC Calculation: 468 R Axis:   55 Text Interpretation:  Sinus rhythm Nonspecific T abnormalities, lateral leads Abnormal ekg Confirmed by Gerhard Munch [phone]) on 07/26/2018 4:15:25 PM   Radiology Dg Chest 2 View  Result Date: 07/26/2018 CLINICAL DATA:  Sudden onset shortness of breath EXAM: CHEST - 2 VIEW COMPARISON:  CT chest 03/30/2018 FINDINGS: Bilateral mild interstitial thickening. Small bilateral pleural effusions. No pneumothorax. No focal consolidation. Stable cardiomediastinal silhouette. Prior mitral valve repair. Prior median sternotomy. No acute osseous abnormality. IMPRESSION: 1. Findings concerning for mild pulmonary edema. Electronically Signed   By: [name]   On: 07/26/2018 17:13    Procedures Procedures (including critical care time)  Medications Ordered in ED Medications  albuterol (PROVENTIL) (2.5 MG/3ML) 0.083% nebulizer solution 5 mg (5 mg Nebulization Given 07/26/18 1707)  furosemide (LASIX) 120 mg in dextrose 5 % 50 mL IVPB (120 mg Intravenous New Bag/Given 07/26/18 1754)     Initial Impression / Assessment and Plan / ED Course  I have reviewed the triage vital signs and the nursing notes.  Pertinent labs & imaging results that were available during my care of the patient were reviewed by me and considered in my medical decision making (see chart for details).     9:42 PM Patient has improved, states that she feels better. Respiratory rate is diminished substantially, she is not hypoxic, not  tachycardic. She has urinated, substantially.  This female with a history of valvular disease, congestive heart failure, presents with dyspnea.  When here she is awake, alert, afebrile, with no x-ray evidence of pneumonia.  When she does, however if signs and symptoms consistent with heart failure exacerbation. Patient received IV Lasix, 1 2 0 mg, with substantial improvement in her condition. Patient discharged stable condition with increased dosing for the next 3 days, outpatient heart failure clinic follow-up.  Final Clinical Impressions(s) / ED Diagnoses   Final diagnoses:  Acute on chronic systolic congestive heart failure [facility])    ED Discharge Orders         Ordered    furosemide (LASIX) 80 MG tablet  Daily     07/26/18 2147           Gerhard Munch, MD 07/26/18 2147

## 2018-07-26 NOTE — ED Triage Notes (Signed)
Short of breath onset this am 

## 2018-10-04 ENCOUNTER — Emergency Department (HOSPITAL_COMMUNITY)
Admission: EM | Admit: 2018-10-04 | Discharge: 2018-10-05 | Disposition: A | Discharge status: Home / Self Care | Payer: Medicaid Other | Attending: Emergency Medicine | Admitting: Emergency Medicine

## 2018-10-04 ENCOUNTER — Other Ambulatory Visit: Payer: Self-pay

## 2018-10-04 ENCOUNTER — Encounter (HOSPITAL_COMMUNITY): Payer: Self-pay | Admitting: Emergency Medicine

## 2018-10-04 ENCOUNTER — Emergency Department (HOSPITAL_COMMUNITY): Payer: Medicaid Other

## 2018-10-04 DIAGNOSIS — F329 Major depressive disorder, single episode, unspecified: Secondary | ICD-10-CM | POA: Diagnosis not present

## 2018-10-04 DIAGNOSIS — I110 Hypertensive heart disease with heart failure: Secondary | ICD-10-CM | POA: Diagnosis not present

## 2018-10-04 DIAGNOSIS — R079 Chest pain, unspecified: Secondary | ICD-10-CM | POA: Diagnosis present

## 2018-10-04 DIAGNOSIS — Z79899 Other long term (current) drug therapy: Secondary | ICD-10-CM | POA: Diagnosis not present

## 2018-10-04 DIAGNOSIS — F419 Anxiety disorder, unspecified: Secondary | ICD-10-CM | POA: Diagnosis not present

## 2018-10-04 DIAGNOSIS — F17210 Nicotine dependence, cigarettes, uncomplicated: Secondary | ICD-10-CM | POA: Insufficient documentation

## 2018-10-04 DIAGNOSIS — I509 Heart failure, unspecified: Secondary | ICD-10-CM | POA: Insufficient documentation

## 2018-10-04 MED ORDER — LORAZEPAM 1 MG PO TABS
1.0000 mg | ORAL_TABLET | Freq: Once | ORAL | Status: AC
  Administered 2018-10-04: 1 mg via ORAL
  Filled 2018-10-04: qty 1

## 2018-10-04 NOTE — ED Triage Notes (Signed)
Pt called EMS for chest pain and SOB x 1 hour, pt has hx of valve replacement, CHF and anxiety, en route 2 asa and 2 asa at home, vs stable per [facility] with HR 82, 122/72, 100% RA, R-18, pt has had cough

## 2018-10-04 NOTE — ED Notes (Signed)
Pt remains on EMS stretcher until room can be cleaned

## 2018-10-04 NOTE — ED Provider Notes (Signed)
[facility] EMERGENCY DEPARTMENT Provider Note   CSN: [id number] Arrival date & time: 10/04/18  2310     History   Chief Complaint Chief Complaint  Patient presents with  . Chest Pain    HPI Theresa Crawford is a 38 y.o. female.  Patient presents to the emergency department for evaluation of chest pain.  Patient reports that she started with a cough yesterday.  Patient reports a history of congestive heart failure with previous valvular repair.  She reports that she needs a mitral valve replacement still.  When she started having some cough yesterday, she took an extra Bumex.  This did not have any effect.  She reports that she has not had any visible swelling or weight gain.  Cough worsened tonight and now she is experiencing a pain deep inside the chest that worsens when she coughs.  Cough is nonproductive.     Past Medical History:  Diagnosis Date  . Anxiety   . Arthritis    "lower back" (04/04/2018)  . Chronic bronchitis (HCC)   . Chronic diastolic CHF (congestive heart failure) (HCC)   . Chronic lower back pain   . DDD (degenerative disc disease), lumbar   . Depression   . GERD (gastroesophageal reflux disease)   . Headache    "weekly" (04/04/2018)  . Heart murmur   . Hypertension   . Mitral valve disease   . Normocytic anemia   . Obesity   . Palpitations   . Pericarditis   . Premature atrial contractions   . Pulmonary hypertension (HCC)   . PVC's (premature ventricular contractions)    a. h/o palpitations with event monitor in 03/2017 showing NSR with PACs/PVCs.  . Recurrent mitral valve stenosis and regurgitation s/p mitral valve repair   . S/P mitral valve repair 03/27/2013   Dr. Darcus Austin - [place], [place] - complex valvuloplasty including resuspension of entire posterior leaflet using Gore-tex neochords and 30 mm Edwards Physio ring annuloplasty  . Sarcoidosis   . Tobacco abuse   . Tricuspid regurgitation     Patient Active Problem List   Diagnosis  Date Noted  . MDD (major depressive disorder), recurrent episode (HCC) 04/05/2018  . S/P tooth extraction 04/03/2018  . Suicidal ideation 04/03/2018  . MDD (major depressive disorder)   . Chronic diastolic congestive heart failure (HCC)   . Tricuspid regurgitation   . Hypomagnesemia 03/07/2018  . Tobacco abuse   . Dyspnea 01/11/2018  . Prolonged QT interval 09/10/2017  . Normochromic normocytic anemia   . Pulmonary edema 07/05/2017  . Hypokalemia 06/12/2017  . Pulmonary hypertension (HCC) 06/12/2017  . Flash pulmonary edema (HCC) 06/11/2017  . Depression with anxiety 06/11/2017  . Recurrent mitral valve stenosis and regurgitation s/p mitral valve repair   . Acute pulmonary edema (HCC) 10/10/2016  . CAP (community acquired pneumonia) 05/25/2016  . Leukocytosis 05/24/2016  . Acute respiratory failure with hypoxia (HCC) 05/23/2016  . Acute on chronic diastolic CHF (congestive heart failure) (HCC) 05/23/2016  . Cough with hemoptysis 05/23/2016  . Postpartum complication pericarditis in 2009 with eventual needing MVR in 2015 05/23/2016  . Anemia, iron deficiency 05/23/2016  . Hypertension   . SOB (shortness of breath)   . Respiratory distress 02/16/2016  . Essential hypertension 02/16/2016  . S/P mitral valve repair 03/27/2013    Past Surgical History:  Procedure Laterality Date  . ABDOMINAL HERNIA REPAIR  ~ 2011  . ABDOMINAL HYSTERECTOMY  2011   "partial"; PID w/problem with fallopian tubes, had left fallopian  and left ovary removed, pt still having periods  . CARDIAC CATHETERIZATION  2014  . CESAREAN SECTION  2004; 2009  . HERNIA REPAIR    . MITRAL VALVE REPAIR  03/27/2013   Dr. Darcus Austin - [place], [place]. - complex valvuloplasty including resuspension of posterior leaflet with 30 mm CE Physio ring annuloplasty  . MULTIPLE EXTRACTIONS WITH ALVEOLOPLASTY N/A 04/03/2018   Procedure: Extraction of tooth #30 with alveoloplasty and gross debridement of remaining teeth;   Surgeon: Charlynne Pander, DDS;  Location[facility] OR;  Service: Oral Surgery;  Laterality: N/A;  . PARTIAL HYSTERECTOMY  2011   PID w/problem with fallopian tubes, had left fallopian and left ovary removed, pt still having periods  . TEE WITHOUT CARDIOVERSION N/A 05/26/2016   Procedure: TRANSESOPHAGEAL ECHOCARDIOGRAM (TEE);  Surgeon: [name], MD;  Location: [facility] ENDO SUITE;  Service: Cardiovascular;  Laterality: N/A;  . TEE WITHOUT CARDIOVERSION N/A 01/25/2018   Procedure: TRANSESOPHAGEAL ECHOCARDIOGRAM (TEE) WITH PROPOFOL;  Surgeon: Jonelle Sidle, MD;  Location: [facility] ENDO SUITE;  Service: Cardiovascular;  Laterality: N/A;     OB History    Gravida  2   Para  2   Term  2   Preterm      AB      Living        SAB      TAB      Ectopic      Multiple      Live Births               Home Medications    Prior to Admission medications   Medication Sig Start Date End Date Taking? Authorizing Provider  albuterol (PROVENTIL HFA;VENTOLIN HFA) 108 (90 Base) MCG/ACT inhaler Inhale 2 puffs into the lungs every 6 (six) hours as needed for wheezing or shortness of breath.    [provider]  cyclobenzaprine (FLEXERIL) 10 MG tablet Take 1 tablet (10 mg total) by mouth 2 (two) times daily as needed for muscle spasms. Patient not taking: Reported on 07/26/2018 07/09/18   Donnetta Hutching, MD  diclofenac (VOLTAREN) 50 MG EC tablet Take 1 tablet (50 mg total) by mouth 2 (two) times daily. Patient not taking: Reported on 07/26/2018 07/09/18   Donnetta Hutching, MD  docusate sodium (COLACE) 100 MG capsule Take 100 mg by mouth daily as needed for moderate constipation.     [provider]  doxycycline (VIBRAMYCIN) 100 MG capsule Take 1 capsule (100 mg total) by mouth 2 (two) times daily. 10/05/18   Gilda Crease, MD  EPINEPHrine (EPIPEN 2-PAK) 0.3 mg/0.3 mL IJ SOAJ injection Inject 0.3 Units into the muscle once.     [provider]  ferrous sulfate 325 (65  FE) MG tablet Take 1 tablet (325 mg total) by mouth 2 (two) times daily with a meal. Patient taking differently: Take 325 mg by mouth daily with breakfast.  03/08/18   Philip Aspen, Limmie Patricia, MD  furosemide (LASIX) 80 MG tablet Take 1 tablet (80 mg total) by mouth daily. 07/26/18   Gerhard Munch, MD  LORazepam (ATIVAN) 0.5 MG tablet Take 1 tablet (0.5 mg total) by mouth every 6 (six) hours as needed for anxiety or sleep. 04/15/18   Oneta Rack, NP  metoprolol succinate (TOPROL-XL) 25 MG 24 hr tablet TAKE 1 AND 1/2 TABLETS BY MOUTH DAILY. Patient taking differently: Take 37.5 mg by mouth daily.  07/25/18   Jodelle Gross, NP  nicotine polacrilex (NICORETTE) 2 MG gum Take  1 each (2 mg total) by mouth as needed for smoking cessation. Patient not taking: Reported on 07/09/2018 04/15/18   Oneta Rack, NP  nitrofurantoin, macrocrystal-monohydrate, (MACROBID) 100 MG capsule Take 1 capsule (100 mg total) by mouth 2 (two) times daily. Patient not taking: Reported on 07/26/2018 07/09/18   Donnetta Hutching, MD  oseltamivir (TAMIFLU) 75 MG capsule Take 1 capsule (75 mg total) by mouth every 12 (twelve) hours. 10/05/18   Gilda Crease, MD  potassium chloride SA (KLOR-CON M20) 20 MEQ tablet Take 20 mEq by mouth 2 (two) times daily.    [provider]  Probiotic Product (PROBIOTIC PO) Take 1 capsule by mouth daily.    [provider]  QUEtiapine (SEROQUEL) 50 MG tablet Take 1 tablet (50 mg total) by mouth at bedtime. 04/15/18   Oneta Rack, NP  sertraline (ZOLOFT) 100 MG tablet Take 1 tablet (100 mg total) by mouth daily. 04/16/18   Oneta Rack, NP  traZODone (DESYREL) 50 MG tablet Take 0.5 tablets (25 mg total) by mouth at bedtime. Patient taking differently: Take 25-50 mg by mouth at bedtime.  04/15/18   Oneta Rack, NP    Family History Family History  Problem Relation Age of Onset  . Heart failure Father        just received LVAD  . Heart disease Paternal  Grandfather        stent placement    Social History Social History   Tobacco Use  . Smoking status: Current Some Day Smoker    Packs/day: 0.25    Years: 24.00    Pack years: 6.00    Types: Cigarettes    Start date: 11/03/1999  . Smokeless tobacco: Never Used  Substance Use Topics  . Alcohol use: Yes    Comment: 04/04/2018 "1-2 beers/month; if that"  . Drug use: Not Currently     Allergies   Bee venom; Lisinopril; Penicillins; Perflutren lipid microsphere; Shellfish allergy; and Contrast media [iodinated diagnostic agents]   Review of Systems Review of Systems  Respiratory: Positive for cough.   Cardiovascular: Positive for chest pain.  All other systems reviewed and are negative.    Physical Exam Updated Vital Signs BP 106/69   Pulse 84   Resp (!) 31   Ht 5\' 4"  (1.626 m)   Wt 101.2 kg   LMP 09/21/2018 (Within Weeks)   SpO2 97%   BMI 38.28 kg/m   Physical Exam Vitals signs and nursing note reviewed.  Constitutional:      General: She is not in acute distress.    Appearance: Normal appearance. She is well-developed.  HENT:     Head: Normocephalic and atraumatic.     Right Ear: Hearing normal.     Left Ear: Hearing normal.     Nose: Nose normal.  Eyes:     Conjunctiva/sclera: Conjunctivae normal.     Pupils: Pupils are equal, round, and reactive to light.  Neck:     Musculoskeletal: Normal range of motion and neck supple.  Cardiovascular:     Rate and Rhythm: Regular rhythm.     Heart sounds: S1 normal and S2 normal. No murmur. No friction rub. No gallop.   Pulmonary:     Effort: Pulmonary effort is normal. No respiratory distress.     Breath sounds: Normal breath sounds.  Chest:     Chest wall: No tenderness.  Abdominal:     General: Bowel sounds are normal.     Palpations: Abdomen is  soft.     Tenderness: There is no abdominal tenderness. There is no guarding or rebound. Negative signs include Murphy's sign and McBurney's sign.     Hernia: No  hernia is present.  Musculoskeletal: Normal range of motion.  Skin:    General: Skin is warm and dry.     Findings: No rash.  Neurological:     Mental Status: She is alert and oriented to person, place, and time.     GCS: GCS eye subscore is 4. GCS verbal subscore is 5. GCS motor subscore is 6.     Cranial Nerves: No cranial nerve deficit.     Sensory: No sensory deficit.     Coordination: Coordination normal.  Psychiatric:        Speech: Speech normal.        Behavior: Behavior normal.        Thought Content: Thought content normal.      ED Treatments / Results  Labs (all labs ordered are listed, but only abnormal results are displayed) Labs Reviewed  CBC WITH DIFFERENTIAL/PLATELET - Abnormal; Notable for the following components:      Result Value   WBC 15.9 (*)    Hemoglobin 10.2 (*)    HCT 34.1 (*)    MCH 24.2 (*)    MCHC 29.9 (*)    RDW 16.9 (*)    Neutro Abs 13.3 (*)    All other components within normal limits  BASIC METABOLIC PANEL - Abnormal; Notable for the following components:   Potassium 3.1 (*)    Glucose, Bld 102 (*)    Calcium 8.5 (*)    All other components within normal limits  BRAIN NATRIURETIC PEPTIDE - Abnormal; Notable for the following components:   B Natriuretic Peptide 225.0 (*)    All other components within normal limits  TROPONIN I    EKG EKG Interpretation  Date/Time:  Wednesday October 04 2018 23:44:54 EST Ventricular Rate:  85 PR Interval:    QRS Duration: 89 QT Interval:  390 QTC Calculation: 464 R Axis:   59 Text Interpretation:  Sinus rhythm Normal ECG Confirmed by Gilda Creaseollina, Damya Comley J [phone]([id number]) on 10/04/2018 11:46:42 PM   Radiology Dg Chest 2 View  Result Date: 10/05/2018 CLINICAL DATA:  38 year old female with chest pain and cough. EXAM: CHEST - 2 VIEW COMPARISON:  Chest radiograph dated 07/26/2018 FINDINGS: There is mild cardiomegaly and mild vascular prominence. Areas of streaky and hazy interstitial and peribronchial  densities noted. No lobar consolidation. Probable trace right pleural effusion. No pneumothorax. Median sternotomy wires and mechanical mitral valve. No acute osseous pathology. IMPRESSION: Mild cardiomegaly with findings of pulmonary edema. Pneumonia is not excluded. Clinical correlation is recommended. Electronically Signed   By: Elgie CollardArash  Radparvar M.D.   On: 10/05/2018 00:30    Procedures Procedures (including critical care time)  Medications Ordered in ED Medications  LORazepam (ATIVAN) tablet 1 mg (1 mg Oral Given 10/04/18 2349)  furosemide (LASIX) injection 80 mg (80 mg Intravenous Given 10/05/18 0100)     Initial Impression / Assessment and Plan / ED Course  I have reviewed the triage vital signs and the nursing notes.  Pertinent labs & imaging results that were available during my care of the patient were reviewed by me and considered in my medical decision making (see chart for details).     Patient presents to the emergency department for cough and shortness of breath.  Symptoms have developed over 1 to 2 days.  She does have a  history of CHF.  She doubled her Bumex today but did not get relief.  Patient appears to have a mixed picture.  She does have some infectious symptoms but also does have some evidence of volume overload on x-ray.  She is not requiring any oxygen.  She has ambulated here in the ER without difficulty.  Patient has diuresed a large volume here in the ER after Lasix and will double her Bumex at home.  Will also treat empirically for atypical pneumonia and flu.  Final Clinical Impressions(s) / ED Diagnoses   Final diagnoses:  Acute on chronic congestive heart failure, unspecified heart failure type [facility](HCC)    ED Discharge Orders         Ordered    oseltamivir (TAMIFLU) 75 MG capsule  Every 12 hours     10/05/18 0358    doxycycline (VIBRAMYCIN) 100 MG capsule  2 times daily     10/05/18 0358           Gilda CreasePollina, Meckenzie Balsley J, MD 10/05/18 [phone]

## 2018-10-05 LAB — CBC WITH DIFFERENTIAL/PLATELET
Abs Immature Granulocytes: 0.04 10*3/uL (ref 0.00–0.07)
BASOS ABS: 0.1 10*3/uL (ref 0.0–0.1)
BASOS PCT: 0 %
EOS ABS: 0.1 10*3/uL (ref 0.0–0.5)
EOS PCT: 1 %
HCT: 34.1 % — ABNORMAL LOW (ref 36.0–46.0)
Hemoglobin: 10.2 g/dL — ABNORMAL LOW (ref 12.0–15.0)
Immature Granulocytes: 0 %
Lymphocytes Relative: 10 %
Lymphs Abs: 1.5 10*3/uL (ref 0.7–4.0)
MCH: 24.2 pg — ABNORMAL LOW (ref 26.0–34.0)
MCHC: 29.9 g/dL — AB (ref 30.0–36.0)
MCV: 80.8 fL (ref 80.0–100.0)
Monocytes Absolute: 0.8 10*3/uL (ref 0.1–1.0)
Monocytes Relative: 5 %
NRBC: 0 % (ref 0.0–0.2)
Neutro Abs: 13.3 10*3/uL — ABNORMAL HIGH (ref 1.7–7.7)
Neutrophils Relative %: 84 %
Platelets: 380 10*3/uL (ref 150–400)
RBC: 4.22 MIL/uL (ref 3.87–5.11)
RDW: 16.9 % — AB (ref 11.5–15.5)
WBC: 15.9 10*3/uL — AB (ref 4.0–10.5)

## 2018-10-05 LAB — BASIC METABOLIC PANEL
Anion gap: 9 (ref 5–15)
BUN: 17 mg/dL (ref 6–20)
CALCIUM: 8.5 mg/dL — AB (ref 8.9–10.3)
CO2: 24 mmol/L (ref 22–32)
CREATININE: 0.87 mg/dL (ref 0.44–1.00)
Chloride: 102 mmol/L (ref 98–111)
GFR calc non Af Amer: >60 mL/min (ref >60)
Glucose, Bld: 102 mg/dL — ABNORMAL HIGH (ref 70–99)
Potassium: 3.1 mmol/L — ABNORMAL LOW (ref 3.5–5.1)
SODIUM: 135 mmol/L (ref 135–145)

## 2018-10-05 LAB — TROPONIN I

## 2018-10-05 LAB — BRAIN NATRIURETIC PEPTIDE: B NATRIURETIC PEPTIDE 5: 225 pg/mL — AB (ref 0.0–100.0)

## 2018-10-05 MED ORDER — OSELTAMIVIR PHOSPHATE 75 MG PO CAPS: 75.0000 mg | ORAL_CAPSULE | Freq: Two times a day (BID) | ORAL | 0 refills | Status: DC

## 2018-10-05 MED ORDER — DOXYCYCLINE HYCLATE 100 MG PO CAPS: 100.0000 mg | ORAL_CAPSULE | Freq: Two times a day (BID) | ORAL | 0 refills | Status: DC

## 2018-10-05 MED ORDER — FUROSEMIDE 10 MG/ML IJ SOLN
80.0000 mg | Freq: Once | INTRAMUSCULAR | Status: AC
  Administered 2018-10-05: 80 mg via INTRAVENOUS
  Filled 2018-10-05: qty 8

## 2018-10-05 NOTE — Discharge Instructions (Addendum)
Double your Bumex dose for the next 2 days.  Watch your fluid intake.  Complete the other antibiotics.  Return to the ER if your breathing worsens.

## 2018-10-05 NOTE — ED Notes (Signed)
Pt voided large amount. Nurse unable to record volume due to patient flushing before nurse could measure.

## 2018-10-09 ENCOUNTER — Other Ambulatory Visit: Payer: Self-pay | Admitting: Adult Health

## 2018-10-24 ENCOUNTER — Other Ambulatory Visit: Payer: Self-pay | Admitting: Adult Health

## 2018-10-25 ENCOUNTER — Other Ambulatory Visit: Payer: Self-pay | Admitting: Adult Health

## 2018-11-05 ENCOUNTER — Other Ambulatory Visit: Payer: Self-pay

## 2018-11-05 ENCOUNTER — Emergency Department (HOSPITAL_COMMUNITY)
Admission: EM | Admit: 2018-11-05 | Discharge: 2018-11-06 | Disposition: A | Discharge status: Home / Self Care | Payer: Self-pay | Attending: Emergency Medicine | Admitting: Emergency Medicine

## 2018-11-05 ENCOUNTER — Encounter (HOSPITAL_COMMUNITY): Payer: Self-pay | Admitting: *Deleted

## 2018-11-05 DIAGNOSIS — R252 Cramp and spasm: Secondary | ICD-10-CM

## 2018-11-05 DIAGNOSIS — I110 Hypertensive heart disease with heart failure: Secondary | ICD-10-CM | POA: Insufficient documentation

## 2018-11-05 DIAGNOSIS — D869 Sarcoidosis, unspecified: Secondary | ICD-10-CM

## 2018-11-05 DIAGNOSIS — Z952 Presence of prosthetic heart valve: Secondary | ICD-10-CM | POA: Insufficient documentation

## 2018-11-05 DIAGNOSIS — F419 Anxiety disorder, unspecified: Secondary | ICD-10-CM | POA: Insufficient documentation

## 2018-11-05 DIAGNOSIS — F17210 Nicotine dependence, cigarettes, uncomplicated: Secondary | ICD-10-CM | POA: Insufficient documentation

## 2018-11-05 DIAGNOSIS — R0609 Other forms of dyspnea: Secondary | ICD-10-CM

## 2018-11-05 DIAGNOSIS — F329 Major depressive disorder, single episode, unspecified: Secondary | ICD-10-CM | POA: Insufficient documentation

## 2018-11-05 DIAGNOSIS — I509 Heart failure, unspecified: Secondary | ICD-10-CM

## 2018-11-05 DIAGNOSIS — Z79899 Other long term (current) drug therapy: Secondary | ICD-10-CM | POA: Insufficient documentation

## 2018-11-05 NOTE — ED Triage Notes (Signed)
Pt c/o bilateral leg cramping that started tonight, after the cramping started pt became sob and started to have chest pain, upon arrival to triage, resp rate 40, comfort measures provided to pt and family,

## 2018-11-06 ENCOUNTER — Emergency Department (HOSPITAL_COMMUNITY): Payer: Self-pay

## 2018-11-06 LAB — CBC
HCT: 33.3 % — ABNORMAL LOW (ref 36.0–46.0)
HEMOGLOBIN: 9.8 g/dL — AB (ref 12.0–15.0)
MCH: 24.3 pg — ABNORMAL LOW (ref 26.0–34.0)
MCHC: 29.4 g/dL — ABNORMAL LOW (ref 30.0–36.0)
MCV: 82.4 fL (ref 80.0–100.0)
NRBC: 0 % (ref 0.0–0.2)
Platelets: 404 10*3/uL — ABNORMAL HIGH (ref 150–400)
RBC: 4.04 MIL/uL (ref 3.87–5.11)
RDW: 19.9 % — ABNORMAL HIGH (ref 11.5–15.5)
WBC: 7.7 10*3/uL (ref 4.0–10.5)

## 2018-11-06 LAB — BASIC METABOLIC PANEL
ANION GAP: 6 (ref 5–15)
BUN: 16 mg/dL (ref 6–20)
CO2: 20 mmol/L — ABNORMAL LOW (ref 22–32)
Calcium: 8.7 mg/dL — ABNORMAL LOW (ref 8.9–10.3)
Chloride: 110 mmol/L (ref 98–111)
Creatinine, Ser: 0.97 mg/dL (ref 0.44–1.00)
GFR calc non Af Amer: >60 mL/min (ref >60)
Glucose, Bld: 90 mg/dL (ref 70–99)
POTASSIUM: 4.2 mmol/L (ref 3.5–5.1)
Sodium: 136 mmol/L (ref 135–145)

## 2018-11-06 LAB — D-DIMER, QUANTITATIVE: D-Dimer, Quant: 8.94 ug/mL-FEU — ABNORMAL HIGH (ref 0.00–0.50)

## 2018-11-06 LAB — HCG, QUANTITATIVE, PREGNANCY: hCG, Beta Chain, Quant, S: <1 m[IU]/mL (ref <5)

## 2018-11-06 LAB — BRAIN NATRIURETIC PEPTIDE: B Natriuretic Peptide: 440 pg/mL — ABNORMAL HIGH (ref 0.0–100.0)

## 2018-11-06 LAB — MAGNESIUM: Magnesium: 2.3 mg/dL (ref 1.7–2.4)

## 2018-11-06 LAB — TROPONIN I

## 2018-11-06 MED ORDER — HYDROCORTISONE NA SUCCINATE PF 250 MG IJ SOLR
200.0000 mg | Freq: Once | INTRAMUSCULAR | Status: AC
  Administered 2018-11-06: 200 mg via INTRAVENOUS
  Filled 2018-11-06: qty 200

## 2018-11-06 MED ORDER — FUROSEMIDE 10 MG/ML IJ SOLN
60.0000 mg | Freq: Once | INTRAMUSCULAR | Status: AC
  Administered 2018-11-06: 60 mg via INTRAVENOUS
  Filled 2018-11-06: qty 6

## 2018-11-06 MED ORDER — DIPHENHYDRAMINE HCL 50 MG/ML IJ SOLN: 50.0000 mg | Freq: Once | INTRAMUSCULAR | Status: AC

## 2018-11-06 MED ORDER — PREDNISONE 10 MG PO TABS: 40.0000 mg | ORAL_TABLET | Freq: Every day | ORAL | 0 refills | Status: DC

## 2018-11-06 MED ORDER — IOPAMIDOL (ISOVUE-370) INJECTION 76%
75.0000 mL | Freq: Once | INTRAVENOUS | Status: AC | PRN
  Administered 2018-11-06: 75 mL via INTRAVENOUS

## 2018-11-06 MED ORDER — DIPHENHYDRAMINE HCL 25 MG PO CAPS
50.0000 mg | ORAL_CAPSULE | Freq: Once | ORAL | Status: AC
  Administered 2018-11-06: 50 mg via ORAL
  Filled 2018-11-06: qty 2

## 2018-11-06 MED ORDER — SODIUM CHLORIDE 0.9% FLUSH: 3.0000 mL | Freq: Once | INTRAVENOUS | Status: DC

## 2018-11-06 MED ORDER — ENOXAPARIN SODIUM 100 MG/ML ~~LOC~~ SOLN
100.0000 mg | Freq: Once | SUBCUTANEOUS | Status: AC
  Administered 2018-11-06: 100 mg via SUBCUTANEOUS
  Filled 2018-11-06: qty 1

## 2018-11-06 MED ORDER — DIAZEPAM 5 MG PO TABS
5.0000 mg | ORAL_TABLET | Freq: Once | ORAL | Status: AC
  Administered 2018-11-06: 5 mg via ORAL
  Filled 2018-11-06: qty 1

## 2018-11-06 NOTE — ED Notes (Signed)
Pt states she is not ready to leave, wants to continue to sleep and requesting to have breakfast given to her.  RN explained to patient that we have to have to room available for emergencies.  Pt states well it will be an hour before I have a ride.

## 2018-11-06 NOTE — ED Provider Notes (Signed)
[facility] EMERGENCY DEPARTMENT Provider Note   CSN: [id number] Arrival date & time: 11/05/18  2347  Time seen 1:28 AM  History   Chief Complaint Chief Complaint  Patient presents with  . Chest Pain  . Muscle Cramps    Bilat Legs    HPI Theresa Crawford is a 38 y.o. female.     HPI patient states about 11 PM tonight she started having severe cramping in her calves and feet but not her thighs.  She denies any change in her activity that could have caused this.  She states she is never had cramps like this before.  She has a history of congestive heart failure and she states over the past 2 days she has had a cough with some pink-tinged sputum, shortness of breath and extreme dyspnea on exertion.  She states she can only walk 10 feet before she gets very short of breath.  She has had chills without documented fevers.  She states her legs were swelling but not so much today.  She complains of her abdomen feeling bloated and her eye lids being swollen.  PCP House, Eugenio Hoes, [name] Cardiologist Dr. Purvis Sheffield  Past Medical History:  Diagnosis Date  . Anxiety   . Arthritis    "lower back" (04/04/2018)  . Chronic bronchitis (HCC)   . Chronic diastolic CHF (congestive heart failure) (HCC)   . Chronic lower back pain   . DDD (degenerative disc disease), lumbar   . Depression   . GERD (gastroesophageal reflux disease)   . Headache    "weekly" (04/04/2018)  . Heart murmur   . Hypertension   . Mitral valve disease   . Normocytic anemia   . Obesity   . Palpitations   . Pericarditis   . Premature atrial contractions   . Pulmonary hypertension (HCC)   . PVC's (premature ventricular contractions)    a. h/o palpitations with event monitor in 03/2017 showing NSR with PACs/PVCs.  . Recurrent mitral valve stenosis and regurgitation s/p mitral valve repair   . S/P mitral valve repair 03/27/2013   Dr. Darcus Austin - [place], [place] - complex valvuloplasty including resuspension of entire  posterior leaflet using Gore-tex neochords and 30 mm Edwards Physio ring annuloplasty  . Sarcoidosis   . Tobacco abuse   . Tricuspid regurgitation     Patient Active Problem List   Diagnosis Date Noted  . MDD (major depressive disorder), recurrent episode (HCC) 04/05/2018  . S/P tooth extraction 04/03/2018  . Suicidal ideation 04/03/2018  . MDD (major depressive disorder)   . Chronic diastolic congestive heart failure (HCC)   . Tricuspid regurgitation   . Hypomagnesemia 03/07/2018  . Tobacco abuse   . Dyspnea 01/11/2018  . Prolonged QT interval 09/10/2017  . Normochromic normocytic anemia   . Pulmonary edema 07/05/2017  . Hypokalemia 06/12/2017  . Pulmonary hypertension (HCC) 06/12/2017  . Flash pulmonary edema (HCC) 06/11/2017  . Depression with anxiety 06/11/2017  . Recurrent mitral valve stenosis and regurgitation s/p mitral valve repair   . Acute pulmonary edema (HCC) 10/10/2016  . CAP (community acquired pneumonia) 05/25/2016  . Leukocytosis 05/24/2016  . Acute respiratory failure with hypoxia (HCC) 05/23/2016  . Acute on chronic diastolic CHF (congestive heart failure) (HCC) 05/23/2016  . Cough with hemoptysis 05/23/2016  . Postpartum complication pericarditis in 2009 with eventual needing MVR in 2015 05/23/2016  . Anemia, iron deficiency 05/23/2016  . Hypertension   . SOB (shortness of breath)   . Respiratory distress 02/16/2016  .  Essential hypertension 02/16/2016  . S/P mitral valve repair 03/27/2013    Past Surgical History:  Procedure Laterality Date  . ABDOMINAL HERNIA REPAIR  ~ 2011  . ABDOMINAL HYSTERECTOMY  2011   "partial"; PID w/problem with fallopian tubes, had left fallopian and left ovary removed, pt still having periods  . CARDIAC CATHETERIZATION  2014  . CESAREAN SECTION  2004; 2009  . HERNIA REPAIR    . MITRAL VALVE REPAIR  03/27/2013   Dr. Darcus Austin - [place], [place]. - complex valvuloplasty including resuspension of posterior leaflet with  30 mm CE Physio ring annuloplasty  . MULTIPLE EXTRACTIONS WITH ALVEOLOPLASTY N/A 04/03/2018   Procedure: Extraction of tooth #30 with alveoloplasty and gross debridement of remaining teeth;  Surgeon: Charlynne Pander, DDS;  Location: [facility] OR;  Service: Oral Surgery;  Laterality: N/A;  . PARTIAL HYSTERECTOMY  2011   PID w/problem with fallopian tubes, had left fallopian and left ovary removed, pt still having periods  . TEE WITHOUT CARDIOVERSION N/A 05/26/2016   Procedure: TRANSESOPHAGEAL ECHOCARDIOGRAM (TEE);  Surgeon: [name], MD;  Location: [facility] ENDO SUITE;  Service: Cardiovascular;  Laterality: N/A;  . TEE WITHOUT CARDIOVERSION N/A 01/25/2018   Procedure: TRANSESOPHAGEAL ECHOCARDIOGRAM (TEE) WITH PROPOFOL;  Surgeon: Jonelle Sidle, MD;  Location: [facility] ENDO SUITE;  Service: Cardiovascular;  Laterality: N/A;     OB History    Gravida  2   Para  2   Term  2   Preterm      AB      Living        SAB      TAB      Ectopic      Multiple      Live Births               Home Medications    Prior to Admission medications   Medication Sig Start Date End Date Taking? Authorizing Provider  albuterol (PROVENTIL HFA;VENTOLIN HFA) 108 (90 Base) MCG/ACT inhaler Inhale 2 puffs into the lungs every 6 (six) hours as needed for wheezing or shortness of breath.    [provider]  bumetanide (BUMEX) 2 MG tablet TAKE (1) TABLET BY MOUTH TWICE DAILY. 10/25/18   Antoine Poche, MD  cyclobenzaprine (FLEXERIL) 10 MG tablet Take 1 tablet (10 mg total) by mouth 2 (two) times daily as needed for muscle spasms. Patient not taking: Reported on 07/26/2018 07/09/18   Donnetta Hutching, MD  diclofenac (VOLTAREN) 50 MG EC tablet Take 1 tablet (50 mg total) by mouth 2 (two) times daily. Patient not taking: Reported on 07/26/2018 07/09/18   Donnetta Hutching, MD  docusate sodium (COLACE) 100 MG capsule Take 100 mg by mouth daily as needed for moderate constipation.     [provider]  doxycycline (VIBRAMYCIN) 100 MG capsule Take 1 capsule (100 mg total) by mouth 2 (two) times daily. 10/05/18   Gilda Crease, MD  EPINEPHrine (EPIPEN 2-PAK) 0.3 mg/0.3 mL IJ SOAJ injection Inject 0.3 Units into the muscle once.     [provider]  ferrous sulfate 325 (65 FE) MG tablet Take 1 tablet (325 mg total) by mouth 2 (two) times daily with a meal. Patient taking differently: Take 325 mg by mouth daily with breakfast.  03/08/18   Philip Aspen, Limmie Patricia, MD  furosemide (LASIX) 80 MG tablet Take 1 tablet (80 mg total) by mouth daily. 07/26/18   Gerhard Munch, MD  LORazepam (ATIVAN) 0.5 MG tablet Take 1  tablet (0.5 mg total) by mouth every 6 (six) hours as needed for anxiety or sleep. 04/15/18   Oneta Rack, NP  metoprolol succinate (TOPROL-XL) 25 MG 24 hr tablet TAKE 1 AND 1/2 TABLETS BY MOUTH DAILY. 10/25/18   Jodelle Gross, NP  nicotine polacrilex (NICORETTE) 2 MG gum Take 1 each (2 mg total) by mouth as needed for smoking cessation. Patient not taking: Reported on 07/09/2018 04/15/18   Oneta Rack, NP  nitrofurantoin, macrocrystal-monohydrate, (MACROBID) 100 MG capsule Take 1 capsule (100 mg total) by mouth 2 (two) times daily. Patient not taking: Reported on 07/26/2018 07/09/18   Donnetta Hutching, MD  oseltamivir (TAMIFLU) 75 MG capsule Take 1 capsule (75 mg total) by mouth every 12 (twelve) hours. 10/05/18   Gilda Crease, MD  potassium chloride SA (K-DUR,KLOR-CON) 20 MEQ tablet Take 1 tablet (20 mEq total) by mouth daily. NEED OV. 10/10/18   Jodelle Gross, NP  Probiotic Product (PROBIOTIC PO) Take 1 capsule by mouth daily.    [provider]  QUEtiapine (SEROQUEL) 50 MG tablet Take 1 tablet (50 mg total) by mouth at bedtime. 04/15/18   Oneta Rack, NP  sertraline (ZOLOFT) 100 MG tablet Take 1 tablet (100 mg total) by mouth daily. 04/16/18   Oneta Rack, NP  traZODone (DESYREL) 50 MG tablet Take 0.5 tablets (25 mg total) by mouth at  bedtime. Patient taking differently: Take 25-50 mg by mouth at bedtime.  04/15/18   Oneta Rack, NP    Family History Family History  Problem Relation Age of Onset  . Heart failure Father        just received LVAD  . Heart disease Paternal Grandfather        stent placement    Social History Social History   Tobacco Use  . Smoking status: Current Some Day Smoker    Packs/day: 0.25    Years: 24.00    Pack years: 6.00    Types: Cigarettes    Start date: 11/03/1999  . Smokeless tobacco: Never Used  Substance Use Topics  . Alcohol use: Yes    Comment: 04/04/2018 "1-2 beers/month; if that"  . Drug use: Not Currently     Allergies   Bee venom; Lisinopril; Penicillins; Perflutren lipid microsphere; Shellfish allergy; and Contrast media [iodinated diagnostic agents]   Review of Systems Review of Systems  All other systems reviewed and are negative.    Physical Exam Updated Vital Signs BP (!) 106/56   Pulse 70   Temp 98.2 F (36.8 C) (Oral)   Resp 20   Ht  (1.626 m)   Wt 101.2 kg   SpO2 95%   BMI 38.30 kg/m   Vital signs normal    Physical Exam Vitals signs and nursing note reviewed.  Constitutional:      Appearance: She is well-developed.  HENT:     Head: Normocephalic and atraumatic.     Right Ear: External ear normal.     Left Ear: External ear normal.     Nose: Nose normal.     Mouth/Throat:     Mouth: Mucous membranes are moist.  Eyes:     Extraocular Movements: Extraocular movements intact.     Conjunctiva/sclera: Conjunctivae normal.     Pupils: Pupils are equal, round, and reactive to light.  Neck:     Musculoskeletal: Normal range of motion and neck supple.  Cardiovascular:     Rate and Rhythm: Normal rate and regular rhythm.  Pulmonary:     Effort: Pulmonary effort is normal. No respiratory distress.  Abdominal:     General: Bowel sounds are normal. There is distension.     Palpations: Abdomen is soft.  Musculoskeletal:         General: Tenderness present. No swelling.     Comments: Patient has tenderness in her calves without obvious cords or firmness felt, her thighs feel firm however she states she is not having cramping there.  Skin:    General: Skin is warm and dry.     Findings: No erythema or rash.  Neurological:     General: No focal deficit present.     Mental Status: She is alert and oriented to person, place, and time.     Cranial Nerves: No cranial nerve deficit.  Psychiatric:        Mood and Affect: Mood normal.        Behavior: Behavior normal.        Thought Content: Thought content normal.      ED Treatments / Results  Labs (all labs ordered are listed, but only abnormal results are displayed) Results for orders placed or performed during the hospital encounter of 11/05/18  Basic metabolic panel  Result Value Ref Range   Sodium 136 135 - 145 mmol/L   Potassium 4.2 3.5 - 5.1 mmol/L   Chloride 110 98 - 111 mmol/L   CO2 20 (L) 22 - 32 mmol/L   Glucose, Bld 90 70 - 99 mg/dL   BUN 16 6 - 20 mg/dL   Creatinine, Ser 1.61 0.44 - 1.00 mg/dL   Calcium 8.7 (L) 8.9 - 10.3 mg/dL   GFR calc non Af Amer >60 >60 mL/min   GFR calc Af Amer >60 >60 mL/min   Anion gap 6 5 - 15  CBC  Result Value Ref Range   WBC 7.7 4.0 - 10.5 K/uL   RBC 4.04 3.87 - 5.11 MIL/uL   Hemoglobin 9.8 (L) 12.0 - 15.0 g/dL   HCT 09.6 (L) 04.5 - 40.9 %   MCV 82.4 80.0 - 100.0 fL   MCH 24.3 (L) 26.0 - 34.0 pg   MCHC 29.4 (L) 30.0 - 36.0 g/dL   RDW 81.1 (H) 91.4 - 78.2 %   Platelets 404 (H) 150 - 400 K/uL   nRBC 0.0 0.0 - 0.2 %  Troponin I - ONCE - STAT  Result Value Ref Range   Troponin I <0.03 <0.03 ng/mL  hCG, quantitative, pregnancy  Result Value Ref Range   hCG, Beta Chain, Quant, S <1 <5 mIU/mL  Magnesium  Result Value Ref Range   Magnesium 2.3 1.7 - 2.4 mg/dL  Brain natriuretic peptide  Result Value Ref Range   B Natriuretic Peptide 440.0 (H) 0.0 - 100.0 pg/mL  D-dimer, quantitative  Result Value Ref Range     D-Dimer, Quant 8.94 (H) 0.00 - 0.50 ug/mL-FEU   Laboratory interpretation all normal except marked elevation of her d-dimer compared to others, stable elevation of BNP, stable mild anemia    EKG EKG Interpretation  Date/Time:  Sunday November 05 2018 23:39:13 EST Ventricular Rate:  68 PR Interval:  158 QRS Duration: 78 QT Interval:  414 QTC Calculation: 440 R Axis:   65 Text Interpretation:  Normal sinus rhythm Normal ECG No significant change since last tracing 04 Oct 2018 Confirmed by Devoria Albe ([id number]) on 11/06/2018 12:26:05 AM   Radiology Dg Chest 2 View  Result Date: 11/06/2018 CLINICAL DATA:  Bilateral leg cramping.  Shortness of breath and chest pain. EXAM: CHEST - 2 VIEW COMPARISON:  10/05/2018 FINDINGS: Postoperative changes in the mediastinum with sternotomy wires and cardiac valve prosthesis. Cardiac enlargement. No vascular congestion or edema. No focal consolidation. No blunting of costophrenic angles. No pneumothorax. Mediastinal contours appear intact. IMPRESSION: Cardiac enlargement. No evidence of active pulmonary disease. Electronically Signed   By: Burman Nieves M.D.   On: 11/06/2018 01:47    Procedures Procedures (including critical care time)  Medications Ordered in ED Medications  sodium chloride flush (NS) 0.9 % injection 3 mL ( Intravenous Canceled Entry 11/06/18 0052)  diazepam (VALIUM) tablet 5 mg (5 mg Oral Given 11/06/18 0204)  furosemide (LASIX) injection 60 mg (60 mg Intravenous Given 11/06/18 0204)  hydrocortisone sodium succinate (SOLU-CORTEF) injection 200 mg (200 mg Intravenous Given 11/06/18 0333)  diphenhydrAMINE (BENADRYL) capsule 50 mg (50 mg Oral Given 11/06/18 7681)    Or  diphenhydrAMINE (BENADRYL) injection 50 mg ( Intravenous See Alternative 11/06/18 0619)  iopamidol (ISOVUE-370) 76 % injection 75 mL (75 mLs Intravenous Contrast Given 11/06/18 0700)  enoxaparin (LOVENOX) injection 100 mg (100 mg Subcutaneous Given 11/06/18 0439)      Initial Impression / Assessment and Plan / ED Course  I have reviewed the triage vital signs and the nursing notes.  Pertinent labs & imaging results that were available during my care of the patient were reviewed by me and considered in my medical decision making (see chart for details).        We discussed her laboratory tests which had resulted at the time of my exam.  I added a d-dimer, BNP.  We looked at her x-ray, her heart size is noted to be enlarged, she has decreased lung size due to poor inspiratory effort.  She was given oral Valium for her complaints of cramping and IV Lasix for presumed increased pulmonary vascularity seen on her x-ray.  2:37 AM patient's additional blood tests have resulted, her d-dimer is much more elevated than her baseline that was under 2.  We will proceed with a CTA of her chest.  Recheck at 3 AM patient is sitting on the bedside commode urinating.  We discussed her test results and need for CTA.  Patient has a dye allergy however in she needs the premedications which will take many hours.  Approximately 3:30 AM she got her IV Solu-Cortef.  This is followed by Benadryl in 3 hours followed by the CT scan an hour after the Benadryl.Marland Kitchen  3:50 AM due to the marked elevation of her d-dimer I am going to give her a Lovenox injection while waiting to do her CT.  Recheck at 5:30 AM patient is resting comfortably in bed.  She has put out 3300 cc of urine.  We are waiting to do her CT scan.  Patient scan should be ready to be done around 730.  She was turned over to Dr. Deretha Emory to get the results of her scan and do her disposition.  She has already had a Lovenox injection in case she does have a PE.  Her electrolytes are normal including her potassium and magnesium, her calcium is mildly low however it is in the range she has been on multiple prior tests.  She has had over 3000 cc of urinary output after the Lasix.  Hopefully if her CT scan is normal she can be  discharged home.  Final Clinical Impressions(s) / ED Diagnoses   Final diagnoses:  Leg cramps  DOE (dyspnea on exertion)  Congestive heart failure, unspecified HF chronicity, unspecified heart failure type (HCC)    Disposition pending  Devoria Albe, MD, Concha Pyo, MD 11/06/18 [phone]

## 2018-11-06 NOTE — ED Notes (Signed)
Called AC for Solu-Cortef °

## 2018-11-06 NOTE — ED Notes (Signed)
Pt to ct 

## 2018-11-06 NOTE — ED Provider Notes (Signed)
Patient's CT scan of the chest showed no evidence of pulmonary embolism.  Also showed no evidence of any significant congestive heart failure pulmonary edema at this time.  Patient did receive Lasix overnight.  Does show predominantly sarcoidosis.  Will put patient on steroid prednisone course for 5 days.  Have her follow-up with her primary care doctor.  Patient overall improved significantly.   Vanetta Mulders, MD 11/06/18 [phone]

## 2018-11-06 NOTE — ED Notes (Signed)
Patient back from x-ray 

## 2018-11-06 NOTE — ED Notes (Signed)
Patient laughing with transporter

## 2018-11-06 NOTE — Discharge Instructions (Signed)
CT scan of the chest did not show any blood clots in the lungs.  Was consistent with sarcoidosis.  Take the prednisone as directed for the next 5 days.  Continue all your other current medications.  Follow-up with your primary care doctor in the next few days call and make an appointment.  Return for any new or worse symptoms.

## 2019-01-10 ENCOUNTER — Telehealth: Payer: Self-pay | Admitting: Cardiology

## 2019-01-10 ENCOUNTER — Other Ambulatory Visit: Payer: Self-pay | Admitting: Cardiology

## 2019-01-10 NOTE — Telephone Encounter (Signed)
Patient called stating that she continues to retain fluid.  States that she is having some shortness of breath off and on.  [phone])

## 2019-01-10 NOTE — Telephone Encounter (Signed)
Called pt. No answer. Left message for pt to return call.  

## 2019-01-11 ENCOUNTER — Telehealth: Payer: Self-pay | Admitting: Cardiovascular Disease

## 2019-01-11 NOTE — Telephone Encounter (Signed)
Patient did not see [facility] for MVR and repair of tricuspid valve last year.Has a lot of family issues.lost insurance. Is working with some at [facility] to help her with her bills.Says her

## 2019-01-11 NOTE — Telephone Encounter (Signed)
Pt is calling stating that she never heard her phone yesterday, please give her a call back [phone]

## 2019-01-11 NOTE — Telephone Encounter (Signed)
Virtual Visit Pre-Appointment Phone Call  "(Name), I am calling you today to discuss your upcoming appointment. We are currently trying to limit exposure to the virus that causes COVID-19 by seeing patients at home rather than in the office."  [phone]  1. "What is the BEST phone number to call the day of the visit?" - Do you have or have access to (through a family member/friend) a smartphone with video capability that we can use for your visit?" a. If yes - list this number in appt notes as cell (if different from BEST phone #) and list the appointment type as a VIDEO visit in appointment notes b. If no - list the appointment type as a PHONE visit in appointment notes  2. Confirm consent - "In the setting of the current Covid19 crisis, you are scheduled for a (phone or video) visit with your provider on (date) at (time).  Just as we do with many in-office visits, in order for you to participate in this visit, we must obtain consent.  If you'd like, I can send this to your mychart (if signed up) or email for you to review.  Otherwise, I can obtain your verbal consent now.  All virtual visits are billed to your insurance company just like a normal visit would be.  By agreeing to a virtual visit, we'd like you to understand that the technology does not allow for your provider to perform an examination, and thus may limit your provider's ability to fully assess your condition. If your provider identifies any concerns that need to be evaluated in person, we will make arrangements to do so.  Finally, though the technology is pretty good, we cannot assure that it will always work on either your or our end, and in the setting of a video visit, we may have to convert it to a phone-only visit.  In either situation, we cannot ensure that we have a secure connection.  Are you willing to proceed?" STAFF: Did the patient verbally acknowledge consent to telehealth visit? Document YES/NO here: YES    3. Advise patient to be prepared - "Two hours prior to your appointment, go ahead and check your blood pressure, pulse, oxygen saturation, and your weight (if you have the equipment to check those) and write them all down. When your visit starts, your provider will ask you for this information. If you have an Apple Watch or Kardia device, please plan to have heart rate information ready on the day of your appointment. Please have a pen and paper handy nearby the day of the visit as well."  4. Give patient instructions for MyChart download to smartphone OR Doximity/Doxy.me as below if video visit (depending on what platform provider is using)  5. Inform patient they will receive a phone call 15 minutes prior to their appointment time (may be from unknown caller ID) so they should be prepared to answer    TELEPHONE CALL NOTE  Theresa Crawford has been deemed a candidate for a follow-up tele-health visit to limit community exposure during the Covid-19 pandemic. I spoke with the patient via phone to ensure availability of phone/video source, confirm preferred email & phone number, and discuss instructions and expectations.  I reminded Theresa Crawford to be prepared with any vital sign and/or heart rhythm information that could potentially be obtained via home monitoring, at the time of her visit. I reminded Theresa Crawford to expect a phone call prior to her visit.  Vicky  T Slaughter 01/11/2019 3:06 PM   INSTRUCTIONS FOR DOWNLOADING THE MYCHART APP TO SMARTPHONE  - The patient must first make sure to have activated MyChart and know their login information - If Apple, go to [employer] and type in MyChart in the search bar and download the app. If Android, ask patient to go to [employer] Play Store and type in [place] in the search bar and download the app. The app is free but as with any other app downloads, their phone may require them to verify saved payment information or Apple/Android password.  - The  patient will need to then log into the app with their MyChart username and password, and select [facility] as their healthcare provider to link the account. When it is time for your visit, go to the MyChart app, find appointments, and click Begin Video Visit. Be sure to Select Allow for your device to access the Microphone and Camera for your visit. You will then be connected, and your provider will be with you shortly.  **If they have any issues connecting, or need assistance please contact MyChart service desk [phone])**  **If using a computer, in order to ensure the best quality for their visit they will need to use either of the following Internet Browsers: [employer], or Google Chrome**  IF USING DOXIMITY or DOXY.ME - The patient will receive a link just prior to their visit by text.     FULL LENGTH CONSENT FOR TELE-HEALTH VISIT   I hereby voluntarily request, consent and authorize [facility] and its employed or contracted physicians, physician assistants, nurse practitioners or other licensed health care professionals (the Practitioner), to provide me with telemedicine health care services (the Services") as deemed necessary by the treating Practitioner. I acknowledge and consent to receive the Services by the Practitioner via telemedicine. I understand that the telemedicine visit will involve communicating with the Practitioner through live audiovisual communication technology and the disclosure of certain medical information by electronic transmission. I acknowledge that I have been given the opportunity to request an in-person assessment or other available alternative prior to the telemedicine visit and am voluntarily participating in the telemedicine visit.  I understand that I have the right to withhold or withdraw my consent to the use of telemedicine in the course of my care at any time, without affecting my right to future care or treatment, and that the  Practitioner or I may terminate the telemedicine visit at any time. I understand that I have the right to inspect all information obtained and/or recorded in the course of the telemedicine visit and may receive copies of available information for a reasonable fee.  I understand that some of the potential risks of receiving the Services via telemedicine include:   Delay or interruption in medical evaluation due to technological equipment failure or disruption;  Information transmitted may not be sufficient (e.g. poor resolution of images) to allow for appropriate medical decision making by the Practitioner; and/or   In rare instances, security protocols could fail, causing a breach of personal health information.  Furthermore, I acknowledge that it is my responsibility to provide information about my medical history, conditions and care that is complete and accurate to the best of my ability. I acknowledge that Practitioner's advice, recommendations, and/or decision may be based on factors not within their control, such as incomplete or inaccurate data provided by me or distortions of diagnostic images or specimens that may result from electronic transmissions. I understand that the practice  of medicine is not an Chief Strategy Officer and that Practitioner makes no warranties or guarantees regarding treatment outcomes. I acknowledge that I will receive a copy of this consent concurrently upon execution via email to the email address I last provided but may also request a printed copy by calling the office of [facility].    I understand that my insurance will be billed for this visit.   I have read or had this consent read to me.  I understand the contents of this consent, which adequately explains the benefits and risks of the Services being provided via telemedicine.   I have been provided ample opportunity to ask questions regarding this consent and the Services and have had my questions answered to my  satisfaction.  I give my informed consent for the services to be provided through the use of telemedicine in my medical care  By participating in this telemedicine visit I agree to the above.

## 2019-01-11 NOTE — Telephone Encounter (Signed)
Note continued: she says her weight is 227 lbs, normally runs around 214 lbs    Scheduled doximity apt for tomorrow at 11:30 am    Consent obtained       Virtual Visit Pre-Appointment Phone Call  "(Name), I am calling you today to discuss your upcoming appointment. We are currently trying to limit exposure to the virus that causes COVID-19 by seeing patients at home rather than in the office."  1. "What is the BEST phone number to call the day of the visit?" - include this in appointment notes  2. "Do you have or have access to (through a family member/friend) a smartphone with video capability that we can use for your visit?" a. If yes - list this number in appt notes as "cell" (if different from BEST phone #) and list the appointment type as a VIDEO visit in appointment notes b. If no - list the appointment type as a PHONE visit in appointment notes  3. Confirm consent - "In the setting of the current Covid19 crisis, you are scheduled for a (phone or video) visit with your provider on (date) at (time).  Just as we do with many in-office visits, in order for you to participate in this visit, we must obtain consent.  If you'd like, I can send this to your mychart (if signed up) or email for you to review.  Otherwise, I can obtain your verbal consent now.  All virtual visits are billed to your insurance company just like a normal visit would be.  By agreeing to a virtual visit, we'd like you to understand that the technology does not allow for your provider to perform an examination, and thus may limit your provider's ability to fully assess your condition. If your provider identifies any concerns that need to be evaluated in person, we will make arrangements to do so.  Finally, though the technology is pretty good, we cannot assure that it will always work on either your or our end, and in the setting of a video visit, we may have to convert it to a phone-only visit.  In either situation, we  cannot ensure that we have a secure connection.  Are you willing to proceed?" STAFF: Did the patient verbally acknowledge consent to telehealth visit? Document YES/NO here: Yes 4.   5. Advise patient to be prepared - "Two hours prior to your appointment, go ahead and check your blood pressure, pulse, oxygen saturation, and your weight (if you have the equipment to check those) and write them all down. When your visit starts, your provider will ask you for this information. If you have an Apple Watch or Kardia device, please plan to have heart rate information ready on the day of your appointment. Please have a pen and paper handy nearby the day of the visit as well."  6. Give patient instructions for MyChart download to smartphone OR Doximity/Doxy.me as below if video visit (depending on what platform provider is using)  7. Inform patient they will receive a phone call 15 minutes prior to their appointment time (may be from unknown caller ID) so they should be prepared to answer    TELEPHONE CALL NOTE  IBETH ZENOR has been deemed a candidate for a follow-up tele-health visit to limit community exposure during the Covid-19 pandemic. I spoke with the patient via phone to ensure availability of phone/video source, confirm preferred email & phone number, and discuss instructions and expectations.  I reminded [name] to  be prepared with any vital sign and/or heart rhythm information that could potentially be obtained via home monitoring, at the time of her visit. I reminded SABRENA GAVITT to expect a phone call prior to her visit.  Nori Riis, RN 01/11/2019 3:11 PM   INSTRUCTIONS FOR DOWNLOADING THE MYCHART APP TO SMARTPHONE  - The patient must first make sure to have activated MyChart and know their login information - If Apple, go to [employer] and type in MyChart in the search bar and download the app. If Android, ask patient to go to [employer] and type in [place] in  the search bar and download the app. The app is free but as with any other app downloads, their phone may require them to verify saved payment information or Apple/Android password.  - The patient will need to then log into the app with their MyChart username and password, and select [facility] as their healthcare provider to link the account. When it is time for your visit, go to the MyChart app, find appointments, and click Begin Video Visit. Be sure to Select Allow for your device to access the Microphone and Camera for your visit. You will then be connected, and your provider will be with you shortly.  **If they have any issues connecting, or need assistance please contact MyChart service desk [phone])**  **If using a computer, in order to ensure the best quality for their visit they will need to use either of the following Internet Browsers: [employer], or Google Chrome**  IF USING DOXIMITY or DOXY.ME - The patient will receive a link just prior to their visit by text.     FULL LENGTH CONSENT FOR TELE-HEALTH VISIT   I hereby voluntarily request, consent and authorize [facility] and its employed or contracted physicians, physician assistants, nurse practitioners or other licensed health care professionals (the Practitioner), to provide me with telemedicine health care services (the "Services") as deemed necessary by the treating Practitioner. I acknowledge and consent to receive the Services by the Practitioner via telemedicine. I understand that the telemedicine visit will involve communicating with the Practitioner through live audiovisual communication technology and the disclosure of certain medical information by electronic transmission. I acknowledge that I have been given the opportunity to request an in-person assessment or other available alternative prior to the telemedicine visit and am voluntarily participating in the telemedicine visit.  I understand that  I have the right to withhold or withdraw my consent to the use of telemedicine in the course of my care at any time, without affecting my right to future care or treatment, and that the Practitioner or I may terminate the telemedicine visit at any time. I understand that I have the right to inspect all information obtained and/or recorded in the course of the telemedicine visit and may receive copies of available information for a reasonable fee.  I understand that some of the potential risks of receiving the Services via telemedicine include:  Marland Kitchen Delay or interruption in medical evaluation due to technological equipment failure or disruption; . Information transmitted may not be sufficient (e.g. poor resolution of images) to allow for appropriate medical decision making by the Practitioner; and/or  . In rare instances, security protocols could fail, causing a breach of personal health information.  Furthermore, I acknowledge that it is my responsibility to provide information about my medical history, conditions and care that is complete and accurate to the best of my ability. I acknowledge that  Practitioner's advice, recommendations, and/or decision may be based on factors not within their control, such as incomplete or inaccurate data provided by me or distortions of diagnostic images or specimens that may result from electronic transmissions. I understand that the practice of medicine is not an exact science and that Practitioner makes no warranties or guarantees regarding treatment outcomes. I acknowledge that I will receive a copy of this consent concurrently upon execution via email to the email address I last provided but may also request a printed copy by calling the office of [facility].    I understand that my insurance will be billed for this visit.   I have read or had this consent read to me. . I understand the contents of this consent, which adequately explains the benefits and risks of  the Services being provided via telemedicine.  . I have been provided ample opportunity to ask questions regarding this consent and the Services and have had my questions answered to my satisfaction. . I give my informed consent for the services to be provided through the use of telemedicine in my medical care  By participating in this telemedicine visit I agree to the above.

## 2019-01-12 ENCOUNTER — Telehealth: Payer: Medicaid Other | Admitting: Cardiovascular Disease

## 2019-01-12 ENCOUNTER — Encounter: Payer: Self-pay | Admitting: Cardiovascular Disease

## 2019-02-20 ENCOUNTER — Telehealth: Payer: Self-pay | Admitting: Cardiovascular Disease

## 2019-02-20 NOTE — Telephone Encounter (Signed)
Needing refills on her  bumetanide (BUMEX) 1 MG tablet [[id number]] and   metoprolol succinate (TOPROL-XL) 25 MG 24 hr tablet [[id number]]   She's started swelling all over and does not want to go to the ER due to not having ins  Please call [phone]

## 2019-02-21 NOTE — Telephone Encounter (Signed)
Informed pt that we cannot refill her scripts unless she is seen by doctor first. Pt made aware, voiced understanding. She did not want to make appointment at this time.

## 2019-02-28 ENCOUNTER — Encounter: Payer: Self-pay | Admitting: Cardiology

## 2019-02-28 ENCOUNTER — Other Ambulatory Visit: Payer: Self-pay

## 2019-02-28 ENCOUNTER — Telehealth (INDEPENDENT_AMBULATORY_CARE_PROVIDER_SITE_OTHER): Payer: Self-pay | Admitting: Cardiology

## 2019-02-28 VITALS — BP 160/95 | HR 104 | Ht 64.0 in | Wt 219.0 lb

## 2019-02-28 DIAGNOSIS — I052 Rheumatic mitral stenosis with insufficiency: Secondary | ICD-10-CM

## 2019-02-28 DIAGNOSIS — F418 Other specified anxiety disorders: Secondary | ICD-10-CM

## 2019-02-28 DIAGNOSIS — I050 Rheumatic mitral stenosis: Secondary | ICD-10-CM

## 2019-02-28 DIAGNOSIS — I5033 Acute on chronic diastolic (congestive) heart failure: Secondary | ICD-10-CM

## 2019-02-28 DIAGNOSIS — Z9889 Other specified postprocedural states: Secondary | ICD-10-CM

## 2019-02-28 DIAGNOSIS — I5032 Chronic diastolic (congestive) heart failure: Secondary | ICD-10-CM

## 2019-02-28 MED ORDER — METOPROLOL SUCCINATE ER 25 MG PO TB24: 37.5000 mg | ORAL_TABLET | Freq: Every day | ORAL | 3 refills | Status: DC

## 2019-02-28 MED ORDER — POTASSIUM CHLORIDE CRYS ER 20 MEQ PO TBCR: 40.0000 meq | EXTENDED_RELEASE_TABLET | Freq: Every day | ORAL | 3 refills | Status: DC

## 2019-02-28 MED ORDER — BUMETANIDE 1 MG PO TABS: 2.0000 mg | ORAL_TABLET | Freq: Two times a day (BID) | ORAL | 3 refills | Status: DC

## 2019-02-28 NOTE — Progress Notes (Signed)
Virtual Visit via Video Note   This visit type was conducted due to national recommendations for restrictions regarding the COVID-19 Pandemic (e.g. social distancing) in an effort to limit this patient's exposure and mitigate transmission in our community.  Due to her co-morbid illnesses, this patient is at least at moderate risk for complications without adequate follow up.  This format is felt to be most appropriate for this patient at this time.  All issues noted in this document were discussed and addressed.  A limited physical exam was performed with this format.  Please refer to the patient's chart for her consent to telehealth for [facility].   Date:  02/28/2019   ID:  Theresa DaneLatasha Y Crawford, DOB 1981-05-29, MRN [id number]  Patient Location: Home Provider Location: Office  PCP:  Jerrell BelfastHouse, [name], [name]  Cardiologist:  Prentice DockerSuresh Koneswaran, MD  Electrophysiologist:  None   Evaluation Performed:  Follow-Up Visit  Chief Complaint:  SOB, edema  History of Present Illness:    Theresa Crawford is a 38 y.o. female with a history of mitral valve repair in 2014 when she lived in [place].  She was admitted with diastolic heart failure in June 2019.  TEE done then showed moderate to severe mitral stenosis.  The patient was referred to Dr. Cornelius Moraswen for consideration of valve replacement.  She saw Dr. Cornelius Moraswen in July 2019 and pre-valve replacements procedures were initiated.  She underwent dental extraction later in July 2019 in preparation for this.  She then had a breakdown and was admitted with depression and suicidal ideation.  She was admitted to 2 weeks at behavioral health.  Valve surgery was not done, it had been scheduled for August.  The patient was contacted today for video follow-up visit.  She had been seen in the ED in Nov 2019, and jan and feb 2020 with SOB and chest pain.  She tells me she has been out of her cardiac medications for 2 weeks.  She has increased dyspnea on exertion and increased  edema.  She tells me she is ready to reconsider valve surgery.  The patient does not have symptoms concerning for COVID-19 infection (fever, chills, cough, or new shortness of breath).    Past Medical History:  Diagnosis Date  . Anxiety   . Arthritis    "lower back" (04/04/2018)  . Chronic bronchitis (HCC)   . Chronic diastolic CHF (congestive heart failure) (HCC)   . Chronic lower back pain   . DDD (degenerative disc disease), lumbar   . Depression   . GERD (gastroesophageal reflux disease)   . Headache    "weekly" (04/04/2018)  . Heart murmur   . Hypertension   . Mitral valve disease   . Normocytic anemia   . Obesity   . Palpitations   . Pericarditis   . Premature atrial contractions   . Pulmonary hypertension (HCC)   . PVC's (premature ventricular contractions)    a. h/o palpitations with event monitor in 03/2017 showing NSR with PACs/PVCs.  . Recurrent mitral valve stenosis and regurgitation s/p mitral valve repair   . S/P mitral valve repair 03/27/2013   Dr. Darcus Austin[place] complex valvuloplasty including resuspension of entire posterior leaflet using Gore-tex neochords and 30 mm Edwards Physio ring annuloplasty  . Sarcoidosis   . Tobacco abuse   . Tricuspid regurgitation    Past Surgical History:  Procedure Laterality Date  . ABDOMINAL HERNIA REPAIR  ~ 2011  . ABDOMINAL HYSTERECTOMY  2011   "  partial"; PID w/problem with fallopian tubes, had left fallopian and left ovary removed, pt still having periods  . CARDIAC CATHETERIZATION  2014  . CESAREAN SECTION  2004; 2009  . HERNIA REPAIR    . MITRAL VALVE REPAIR  03/27/2013   Dr. Darcus AustinMark [place] - complex valvuloplasty including resuspension of posterior leaflet with 30 mm CE Physio ring annuloplasty  . MULTIPLE EXTRACTIONS WITH ALVEOLOPLASTY N/A 04/03/2018   Procedure: Extraction of tooth #30 with alveoloplasty and gross debridement of remaining teeth;  Surgeon: Charlynne PanderKulinski, Ronald F, DDS;   [facility];  Service: Oral Surgery;  Laterality: N/A;  . PARTIAL HYSTERECTOMY  2011   PID w/problem with fallopian tubes, had left fallopian and left ovary removed, pt still having periods  . TEE WITHOUT CARDIOVERSION N/A 05/26/2016   Procedure: TRANSESOPHAGEAL ECHOCARDIOGRAM (TEE);  Surgeon: Laqueta LindenSuresh A Koneswaran, MD;  [facility];  Service: Cardiovascular;  Laterality: N/A;  . TEE WITHOUT CARDIOVERSION N/A 01/25/2018   Procedure: TRANSESOPHAGEAL ECHOCARDIOGRAM (TEE) WITH PROPOFOL;  Surgeon: Jonelle SidleMcDowell, Samuel G, MD;  [facility];  Service: Cardiovascular;  Laterality: N/A;     Current Meds  Medication Sig  . albuterol (PROVENTIL HFA;VENTOLIN HFA) 108 (90 Base) MCG/ACT inhaler Inhale 2 puffs into the lungs every 6 (six) hours as needed for wheezing or shortness of breath.  . bumetanide (BUMEX) 1 MG tablet TAKE 2 TABLETS BY MOUTH TWICE DAILY.  Marland Kitchen. docusate sodium (COLACE) 100 MG capsule Take 100 mg by mouth daily as needed for moderate constipation.   Marland Kitchen. EPINEPHrine (EPIPEN 2-PAK) 0.3 mg/0.3 mL IJ SOAJ injection Inject 0.3 Units into the muscle once.   . ferrous sulfate 325 (65 FE) MG tablet Take 1 tablet (325 mg total) by mouth 2 (two) times daily with a meal. (Patient taking differently: Take 325 mg by mouth daily with breakfast. )  . LORazepam (ATIVAN) 0.5 MG tablet Take 1 tablet (0.5 mg total) by mouth every 6 (six) hours as needed for anxiety or sleep.  . metoprolol succinate (TOPROL-XL) 25 MG 24 hr tablet TAKE 1 AND 1/2 TABLETS BY MOUTH DAILY.  Marland Kitchen. potassium chloride SA (K-DUR) 20 MEQ tablet Take 40 mEq by mouth daily. Take 2 tablets daily  . Probiotic Product (PROBIOTIC PO) Take 1 capsule by mouth daily.  . QUEtiapine (SEROQUEL) 50 MG tablet Take 1 tablet (50 mg total) by mouth at bedtime.  . sertraline (ZOLOFT) 100 MG tablet Take 1 tablet (100 mg total) by mouth daily.  . traZODone (DESYREL) 50 MG tablet Take 0.5 tablets (25 mg total) by mouth at bedtime. (Patient  taking differently: Take 25-50 mg by mouth at bedtime. )     Allergies:   Bee venom, Lisinopril, Penicillins, Perflutren lipid microsphere, Shellfish allergy, and Contrast media [iodinated diagnostic agents]   Social History   Tobacco Use  . Smoking status: Current Some Day Smoker    Packs/day: 0.25    Years: 24.00    Pack years: 6.00    Types: Cigarettes    Start date: 11/03/1999  . Smokeless tobacco: Never Used  Substance Use Topics  . Alcohol use: Yes    Comment: 04/04/2018 "1-2 beers/month; if that"  . Drug use: Not Currently     Family Hx: The patient's family history includes Heart disease in her paternal grandfather; Heart failure in her father.  ROS:   Please see the history of present illness.    All other systems reviewed and are negative.   Prior CV studies:  The following studies were reviewed today: TEE- 01/25/2018  Labs/Other Tests and Data Reviewed:    EKG:  No ECG reviewed.  Recent Labs: 04/14/2018: TSH 3.962 07/26/2018: ALT 11 11/06/2018: B Natriuretic Peptide 440.0; BUN 16; Creatinine, Ser 0.97; Hemoglobin 9.8; Magnesium 2.3; Platelets 404; Potassium 4.2; Sodium 136   Recent Lipid Panel No results found for: CHOL, TRIG, HDL, CHOLHDL, LDLCALC, LDLDIRECT  Wt Readings from Last 3 Encounters:  02/28/19 219 lb (99.3 kg)  11/06/18 223 lb 1.7 oz (101.2 kg)  10/04/18 223 lb (101.2 kg)     Objective:    Vital Signs:  BP (!) 160/95   Pulse (!) 104   Ht 5\' 4"  (1.626 m)   Wt 219 lb (99.3 kg)   BMI 37.59 kg/m    VITAL SIGNS:  reviewed  ASSESSMENT & PLAN:    Acute on chronic diastolic CHF- Out of her beta blocker and diuretic for at least two weeks.   Mitral stenosis- She is ready to go ahead with MV replacement.  S/P MV repair 2014-  Depression- Admitted for two weeks in July-Aug 2019   COVID-19 Education: The signs and symptoms of COVID-19 were discussed with the patient and how to seek care for testing (follow up with PCP or arrange  E-visit).  The importance of social distancing was discussed today.  Time:   Today, I have spent 15 minutes with the patient with telehealth technology discussing the above problems.     Medication Adjustments/Labs and Tests Ordered: Current medicines are reviewed at length with the patient today.  Concerns regarding medicines are outlined above.   Tests Ordered: No orders of the defined types were placed in this encounter.   Medication Changes: No orders of the defined types were placed in this encounter.   Follow Up:  Virtual Visit or In Person Refer to Dr Roxy Manns.  Refill Toprol, Demadex, and K+.  F/U in two months  Signed, Kerin Ransom, Hershal Coria  02/28/2019 1:45 PM    [facility]

## 2019-02-28 NOTE — Patient Instructions (Addendum)
Medication Instructions: Your physician recommends that you continue on your current medications as directed. Please refer to the Current Medication list given to you today.   Labwork: none  Procedures/Testing: none  Follow-Up: 2 months with Dr.Koneswaran or Physician's Assistant  Any Additional Special Instructions Will Be Listed Below (If Applicable).    You have been referred to Dr. Consuela Mimes, ([facility]) They will call you with an apt    If you need a refill on your cardiac medications before your next appointment, please call your pharmacy.      Thank you for choosing [facility] !

## 2019-03-01 ENCOUNTER — Telehealth: Payer: Self-pay

## 2019-03-01 NOTE — Telephone Encounter (Signed)
Per Dr. Roxy Manns, called to schedule left and right heart cath in preparation for valve surgery.   VM picked up but is full. Will try again later.

## 2019-03-03 ENCOUNTER — Other Ambulatory Visit: Payer: Self-pay

## 2019-03-03 ENCOUNTER — Encounter (HOSPITAL_COMMUNITY): Payer: Self-pay | Admitting: Emergency Medicine

## 2019-03-03 ENCOUNTER — Emergency Department (HOSPITAL_COMMUNITY): Payer: Self-pay

## 2019-03-03 ENCOUNTER — Inpatient Hospital Stay (HOSPITAL_COMMUNITY)
Admission: EM | Admit: 2019-03-03 | Discharge: 2019-03-08 | DRG: 287 | Disposition: A | Discharge status: Home / Self Care | Payer: Self-pay | Attending: Student | Admitting: Student

## 2019-03-03 DIAGNOSIS — M5136 Other intervertebral disc degeneration, lumbar region: Secondary | ICD-10-CM | POA: Diagnosis present

## 2019-03-03 DIAGNOSIS — D869 Sarcoidosis, unspecified: Secondary | ICD-10-CM | POA: Diagnosis present

## 2019-03-03 DIAGNOSIS — Z880 Allergy status to penicillin: Secondary | ICD-10-CM

## 2019-03-03 DIAGNOSIS — I509 Heart failure, unspecified: Secondary | ICD-10-CM

## 2019-03-03 DIAGNOSIS — K219 Gastro-esophageal reflux disease without esophagitis: Secondary | ICD-10-CM | POA: Diagnosis present

## 2019-03-03 DIAGNOSIS — I272 Pulmonary hypertension, unspecified: Secondary | ICD-10-CM | POA: Diagnosis present

## 2019-03-03 DIAGNOSIS — I5082 Biventricular heart failure: Secondary | ICD-10-CM | POA: Diagnosis present

## 2019-03-03 DIAGNOSIS — Z9889 Other specified postprocedural states: Secondary | ICD-10-CM

## 2019-03-03 DIAGNOSIS — I5032 Chronic diastolic (congestive) heart failure: Secondary | ICD-10-CM | POA: Diagnosis present

## 2019-03-03 DIAGNOSIS — R0600 Dyspnea, unspecified: Secondary | ICD-10-CM | POA: Diagnosis present

## 2019-03-03 DIAGNOSIS — I2510 Atherosclerotic heart disease of native coronary artery without angina pectoris: Secondary | ICD-10-CM | POA: Diagnosis present

## 2019-03-03 DIAGNOSIS — Z91013 Allergy to seafood: Secondary | ICD-10-CM

## 2019-03-03 DIAGNOSIS — Z888 Allergy status to other drugs, medicaments and biological substances status: Secondary | ICD-10-CM

## 2019-03-03 DIAGNOSIS — G8929 Other chronic pain: Secondary | ICD-10-CM | POA: Diagnosis present

## 2019-03-03 DIAGNOSIS — J45909 Unspecified asthma, uncomplicated: Secondary | ICD-10-CM | POA: Diagnosis present

## 2019-03-03 DIAGNOSIS — E669 Obesity, unspecified: Secondary | ICD-10-CM | POA: Diagnosis present

## 2019-03-03 DIAGNOSIS — Z79899 Other long term (current) drug therapy: Secondary | ICD-10-CM

## 2019-03-03 DIAGNOSIS — F410 Panic disorder [episodic paroxysmal anxiety] without agoraphobia: Secondary | ICD-10-CM

## 2019-03-03 DIAGNOSIS — Z1159 Encounter for screening for other viral diseases: Secondary | ICD-10-CM

## 2019-03-03 DIAGNOSIS — Z8249 Family history of ischemic heart disease and other diseases of the circulatory system: Secondary | ICD-10-CM

## 2019-03-03 DIAGNOSIS — F17210 Nicotine dependence, cigarettes, uncomplicated: Secondary | ICD-10-CM | POA: Diagnosis present

## 2019-03-03 DIAGNOSIS — I110 Hypertensive heart disease with heart failure: Principal | ICD-10-CM | POA: Diagnosis present

## 2019-03-03 DIAGNOSIS — Z952 Presence of prosthetic heart valve: Secondary | ICD-10-CM

## 2019-03-03 DIAGNOSIS — I10 Essential (primary) hypertension: Secondary | ICD-10-CM | POA: Diagnosis present

## 2019-03-03 DIAGNOSIS — J42 Unspecified chronic bronchitis: Secondary | ICD-10-CM | POA: Diagnosis present

## 2019-03-03 DIAGNOSIS — I5033 Acute on chronic diastolic (congestive) heart failure: Secondary | ICD-10-CM | POA: Diagnosis present

## 2019-03-03 DIAGNOSIS — I2729 Other secondary pulmonary hypertension: Secondary | ICD-10-CM | POA: Diagnosis present

## 2019-03-03 DIAGNOSIS — Z6838 Body mass index (BMI) 38.0-38.9, adult: Secondary | ICD-10-CM

## 2019-03-03 DIAGNOSIS — I50813 Acute on chronic right heart failure: Secondary | ICD-10-CM

## 2019-03-03 DIAGNOSIS — I083 Combined rheumatic disorders of mitral, aortic and tricuspid valves: Secondary | ICD-10-CM | POA: Diagnosis present

## 2019-03-03 DIAGNOSIS — Z91041 Radiographic dye allergy status: Secondary | ICD-10-CM

## 2019-03-03 DIAGNOSIS — M1990 Unspecified osteoarthritis, unspecified site: Secondary | ICD-10-CM | POA: Diagnosis present

## 2019-03-03 DIAGNOSIS — Z91030 Bee allergy status: Secondary | ICD-10-CM

## 2019-03-03 DIAGNOSIS — I052 Rheumatic mitral stenosis with insufficiency: Secondary | ICD-10-CM | POA: Diagnosis present

## 2019-03-03 DIAGNOSIS — D638 Anemia in other chronic diseases classified elsewhere: Secondary | ICD-10-CM | POA: Diagnosis present

## 2019-03-03 DIAGNOSIS — I071 Rheumatic tricuspid insufficiency: Secondary | ICD-10-CM | POA: Diagnosis present

## 2019-03-03 DIAGNOSIS — F411 Generalized anxiety disorder: Secondary | ICD-10-CM | POA: Diagnosis present

## 2019-03-03 LAB — CBC
HCT: 37.2 % (ref 36.0–46.0)
Hemoglobin: 11 g/dL — ABNORMAL LOW (ref 12.0–15.0)
MCH: 23.8 pg — ABNORMAL LOW (ref 26.0–34.0)
MCHC: 29.6 g/dL — ABNORMAL LOW (ref 30.0–36.0)
MCV: 80.5 fL (ref 80.0–100.0)
Platelets: 344 10*3/uL (ref 150–400)
RBC: 4.62 MIL/uL (ref 3.87–5.11)
RDW: 21.3 % — ABNORMAL HIGH (ref 11.5–15.5)
WBC: 7.4 10*3/uL (ref 4.0–10.5)
nRBC: 0 % (ref 0.0–0.2)

## 2019-03-03 LAB — MAGNESIUM: Magnesium: 2.2 mg/dL (ref 1.7–2.4)

## 2019-03-03 LAB — BASIC METABOLIC PANEL
Anion gap: 7 (ref 5–15)
BUN: 12 mg/dL (ref 6–20)
CO2: 22 mmol/L (ref 22–32)
Calcium: 8.7 mg/dL — ABNORMAL LOW (ref 8.9–10.3)
Chloride: 108 mmol/L (ref 98–111)
Creatinine, Ser: 0.83 mg/dL (ref 0.44–1.00)
GFR calc Af Amer: >60 mL/min (ref >60)
GFR calc non Af Amer: >60 mL/min (ref >60)
Glucose, Bld: 102 mg/dL — ABNORMAL HIGH (ref 70–99)
Potassium: 3.8 mmol/L (ref 3.5–5.1)
Sodium: 137 mmol/L (ref 135–145)

## 2019-03-03 LAB — BRAIN NATRIURETIC PEPTIDE: B Natriuretic Peptide: 528 pg/mL — ABNORMAL HIGH (ref 0.0–100.0)

## 2019-03-03 LAB — SARS CORONAVIRUS 2 BY RT PCR (HOSPITAL ORDER, PERFORMED IN ~~LOC~~ HOSPITAL LAB): SARS Coronavirus 2: NEGATIVE

## 2019-03-03 MED ORDER — POTASSIUM CHLORIDE CRYS ER 20 MEQ PO TBCR
20.0000 meq | EXTENDED_RELEASE_TABLET | Freq: Once | ORAL | Status: AC
  Administered 2019-03-03: 20 meq via ORAL
  Filled 2019-03-03: qty 1

## 2019-03-03 MED ORDER — FUROSEMIDE 10 MG/ML IJ SOLN
60.0000 mg | Freq: Once | INTRAMUSCULAR | Status: AC
  Administered 2019-03-03: 22:00:00 60 mg via INTRAVENOUS
  Filled 2019-03-03: qty 6

## 2019-03-03 MED ORDER — LORAZEPAM 2 MG/ML IJ SOLN
0.5000 mg | Freq: Once | INTRAMUSCULAR | Status: AC
  Administered 2019-03-03: 20:00:00 0.5 mg via INTRAVENOUS
  Filled 2019-03-03: qty 1

## 2019-03-03 MED ORDER — FUROSEMIDE 10 MG/ML IJ SOLN: 60.0000 mg | Freq: Once | INTRAMUSCULAR | Status: DC

## 2019-03-03 NOTE — ED Provider Notes (Signed)
[facility] EMERGENCY DEPARTMENT Provider Note   CSN: [id number] Arrival date & time: 03/03/19  1831    History   Chief Complaint Chief Complaint  Patient presents with  . Shortness of Breath    HPI Theresa Crawford is a 38 y.o. female with a history significant for chronic diastolic CHF, GERD, hypertension, mitral valve disease status post valvuloplasty and also tricuspid regurgitation who has been followed by Dr. Cornelius Moraswen of [facility] and is anticipating a conversation with him this week regarding arranging surgical intervention of these valves.  She was recently out of her beta-blocker and her diuretic but was started back on both medications last week via tele-visit with cardiology which she has been taking, but developed worsening shortness of breath along with productive cough which started yesterday evening.  She describes orthopnea, shortness of breath with exertion and coughing up a pink frothy sputum along with subjective fever this morning.  She denies peripheral edema and chest pain, although notes transient pain with coughing midsternally.  She has had no treatment prior to arrival.  She denies exposure to Covid or others with fever or cough.    The history is provided by the patient.    Past Medical History:  Diagnosis Date  . Anxiety   . Arthritis    "lower back" (04/04/2018)  . Chronic bronchitis (HCC)   . Chronic diastolic CHF (congestive heart failure) (HCC)   . Chronic lower back pain   . DDD (degenerative disc disease), lumbar   . Depression   . GERD (gastroesophageal reflux disease)   . Headache    "weekly" (04/04/2018)  . Heart murmur   . Hypertension   . Mitral valve disease   . Normocytic anemia   . Obesity   . Palpitations   . Pericarditis   . Premature atrial contractions   . Pulmonary hypertension (HCC)   . PVC's (premature ventricular contractions)    a. h/o palpitations with event monitor in 03/2017 showing NSR with PACs/PVCs.  . Recurrent mitral valve  stenosis and regurgitation s/p mitral valve repair   . S/P mitral valve repair 03/27/2013   Dr. Darcus AustinMark [place] complex valvuloplasty including resuspension of entire posterior leaflet using Gore-tex neochords and 30 mm Edwards Physio ring annuloplasty  . Sarcoidosis   . Tobacco abuse   . Tricuspid regurgitation     Patient Active Problem List   Diagnosis Date Noted  . MDD (major depressive disorder), recurrent episode (HCC) 04/05/2018  . S/P tooth extraction 04/03/2018  . Suicidal ideation 04/03/2018  . MDD (major depressive disorder)   . Chronic diastolic congestive heart failure (HCC)   . Tricuspid regurgitation   . Hypomagnesemia 03/07/2018  . Tobacco abuse   . Dyspnea 01/11/2018  . Prolonged QT interval 09/10/2017  . Normochromic normocytic anemia   . Pulmonary edema 07/05/2017  . Hypokalemia 06/12/2017  . Pulmonary hypertension (HCC) 06/12/2017  . Flash pulmonary edema (HCC) 06/11/2017  . Depression with anxiety 06/11/2017  . Recurrent mitral valve stenosis and regurgitation s/p mitral valve repair   . Acute pulmonary edema (HCC) 10/10/2016  . CAP (community acquired pneumonia) 05/25/2016  . Leukocytosis 05/24/2016  . Acute respiratory failure with hypoxia (HCC) 05/23/2016  . Acute on chronic diastolic CHF (congestive heart failure) (HCC) 05/23/2016  . Cough with hemoptysis 05/23/2016  . Postpartum complication pericarditis in 2009 with eventual needing MVR in 2015 05/23/2016  . Anemia, iron deficiency 05/23/2016  . Hypertension   . SOB (shortness of breath)   .  Respiratory distress 02/16/2016  . Essential hypertension 02/16/2016  . S/P mitral valve repair 03/27/2013    Past Surgical History:  Procedure Laterality Date  . ABDOMINAL HERNIA REPAIR  ~ 2011  . ABDOMINAL HYSTERECTOMY  2011   "partial"; PID w/problem with fallopian tubes, had left fallopian and left ovary removed, pt still having periods  . CARDIAC CATHETERIZATION  2014  . CESAREAN  SECTION  2004; 2009  . HERNIA REPAIR    . MITRAL VALVE REPAIR  03/27/2013   Dr. Darcus AustinMark [place] - complex valvuloplasty including resuspension of posterior leaflet with 30 mm CE Physio ring annuloplasty  . MULTIPLE EXTRACTIONS WITH ALVEOLOPLASTY N/A 04/03/2018   Procedure: Extraction of tooth #30 with alveoloplasty and gross debridement of remaining teeth;  Surgeon: Charlynne PanderKulinski, Ronald F, DDS;  [facility];  Service: Oral Surgery;  Laterality: N/A;  . PARTIAL HYSTERECTOMY  2011   PID w/problem with fallopian tubes, had left fallopian and left ovary removed, pt still having periods  . TEE WITHOUT CARDIOVERSION N/A 05/26/2016   Procedure: TRANSESOPHAGEAL ECHOCARDIOGRAM (TEE);  Surgeon: Laqueta LindenSuresh A Koneswaran, MD;  [facility];  Service: Cardiovascular;  Laterality: N/A;  . TEE WITHOUT CARDIOVERSION N/A 01/25/2018   Procedure: TRANSESOPHAGEAL ECHOCARDIOGRAM (TEE) WITH PROPOFOL;  Surgeon: Jonelle SidleMcDowell, Samuel G, MD;  [facility];  Service: Cardiovascular;  Laterality: N/A;     OB History    Gravida  2   Para  2   Term  2   Preterm      AB      Living        SAB      TAB      Ectopic      Multiple      Live Births               Home Medications    Prior to Admission medications   Medication Sig Start Date End Date Taking? Authorizing Provider  albuterol (PROVENTIL HFA;VENTOLIN HFA) 108 (90 Base) MCG/ACT inhaler Inhale 2 puffs into the lungs every 6 (six) hours as needed for wheezing or shortness of breath.    [provider]  bumetanide (BUMEX) 1 MG tablet Take 2 tablets (2 mg total) by mouth 2 (two) times daily. 02/28/19   Laqueta LindenKoneswaran, Suresh A, MD  docusate sodium (COLACE) 100 MG capsule Take 100 mg by mouth daily as needed for moderate constipation.     [provider]  EPINEPHrine (EPIPEN 2-PAK) 0.3 mg/0.3 mL IJ SOAJ injection Inject 0.3 Units into the muscle once.     [provider]  ferrous sulfate 325 (65  FE) MG tablet Take 1 tablet (325 mg total) by mouth 2 (two) times daily with a meal. Patient taking differently: Take 325 mg by mouth daily with breakfast.  03/08/18   Philip AspenHernandez Acosta, Limmie PatriciaEstela Y, MD  LORazepam (ATIVAN) 0.5 MG tablet Take 1 tablet (0.5 mg total) by mouth every 6 (six) hours as needed for anxiety or sleep. 04/15/18   Oneta RackLewis, Tanika N, NP  metoprolol succinate (TOPROL-XL) 25 MG 24 hr tablet Take 1.5 tablets (37.5 mg total) by mouth daily. 02/28/19   Laqueta LindenKoneswaran, Suresh A, MD  potassium chloride SA (K-DUR) 20 MEQ tablet Take 2 tablets (40 mEq total) by mouth daily. Take 2 tablets daily 02/28/19   Laqueta LindenKoneswaran, Suresh A, MD  Probiotic Product (PROBIOTIC PO) Take 1 capsule by mouth daily.    [provider]  QUEtiapine (SEROQUEL) 50 MG tablet Take 1 tablet (  50 mg total) by mouth at bedtime. 04/15/18   Derrill Center, NP  sertraline (ZOLOFT) 100 MG tablet Take 1 tablet (100 mg total) by mouth daily. 04/16/18   Derrill Center, NP  traZODone (DESYREL) 50 MG tablet Take 0.5 tablets (25 mg total) by mouth at bedtime. Patient taking differently: Take 25-50 mg by mouth at bedtime.  04/15/18   Derrill Center, NP    Family History Family History  Problem Relation Age of Onset  . Heart failure Father        just received LVAD  . Heart disease Paternal Grandfather        stent placement    Social History Social History   Tobacco Use  . Smoking status: Current Some Day Smoker    Packs/day: 0.25    Years: 24.00    Pack years: 6.00    Types: Cigarettes    Start date: 11/03/1999  . Smokeless tobacco: Never Used  Substance Use Topics  . Alcohol use: Yes    Comment: 04/04/2018 "1-2 beers/month; if that"  . Drug use: Not Currently     Allergies   Bee venom, Lisinopril, Penicillins, Perflutren lipid microsphere, Shellfish allergy, and Contrast media [iodinated diagnostic agents]   Review of Systems Review of Systems  Constitutional: Positive for fever.  HENT: Negative for congestion  and sore throat.   Eyes: Negative.   Respiratory: Positive for chest tightness and shortness of breath.   Cardiovascular: Negative for chest pain, palpitations and leg swelling.  Gastrointestinal: Negative for abdominal pain, nausea and vomiting.  Genitourinary: Negative.   Musculoskeletal: Negative for arthralgias, joint swelling and neck pain.  Skin: Negative.  Negative for rash and wound.  Neurological: Negative for dizziness, weakness, light-headedness, numbness and headaches.  Psychiatric/Behavioral: Negative.      Physical Exam Updated Vital Signs BP 134/88   Pulse (!) 101   Temp 98.1 F (36.7 C) (Oral)   Resp (!) 22   Ht 5\' 4"  (1.626 m)   Wt 100.2 kg   LMP 02/18/2019 (Exact Date)   SpO2 100%   BMI 37.93 kg/m   Physical Exam Vitals signs and nursing note (anxious and tearful at initial presentation. No respiratory distress) reviewed.  Constitutional:      Appearance: She is well-developed.  HENT:     Head: Normocephalic and atraumatic.  Eyes:     Conjunctiva/sclera: Conjunctivae normal.  Neck:     Musculoskeletal: Normal range of motion.  Cardiovascular:     Rate and Rhythm: Regular rhythm. Tachycardia present.     Heart sounds: Normal heart sounds.  Pulmonary:     Effort: Pulmonary effort is normal.     Breath sounds: Examination of the left-lower field reveals rales. Rales present. No decreased breath sounds, wheezing or rhonchi.  Abdominal:     General: Bowel sounds are normal.     Palpations: Abdomen is soft.     Tenderness: There is no abdominal tenderness.  Musculoskeletal: Normal range of motion.  Skin:    General: Skin is warm and dry.  Neurological:     Mental Status: She is alert.  Psychiatric:        Mood and Affect: Mood is anxious.      ED Treatments / Results  Labs (all labs ordered are listed, but only abnormal results are displayed) Results for orders placed or performed during the hospital encounter of 03/03/19  SARS Coronavirus 2  (CEPHEID- Performed in [place] hospital lab), [facility] Order   Specimen: Nasopharyngeal  Swab  Result Value Ref Range   SARS Coronavirus 2 NEGATIVE NEGATIVE  Basic metabolic panel  Result Value Ref Range   Sodium 137 135 - 145 mmol/L   Potassium 3.8 3.5 - 5.1 mmol/L   Chloride 108 98 - 111 mmol/L   CO2 22 22 - 32 mmol/L   Glucose, Bld 102 (H) 70 - 99 mg/dL   BUN 12 6 - 20 mg/dL   Creatinine, Ser 4.09 0.44 - 1.00 mg/dL   Calcium 8.7 (L) 8.9 - 10.3 mg/dL   GFR calc non Af Amer >60 >60 mL/min   GFR calc Af Amer >60 >60 mL/min   Anion gap 7 5 - 15  Magnesium  Result Value Ref Range   Magnesium 2.2 1.7 - 2.4 mg/dL  Brain natriuretic peptide (order ONLY if patient c/o SOB)  Result Value Ref Range   B Natriuretic Peptide 528.0 (H) 0.0 - 100.0 pg/mL  CBC  Result Value Ref Range   WBC 7.4 4.0 - 10.5 K/uL   RBC 4.62 3.87 - 5.11 MIL/uL   Hemoglobin 11.0 (L) 12.0 - 15.0 g/dL   HCT 81.1 91.4 - 78.2 %   MCV 80.5 80.0 - 100.0 fL   MCH 23.8 (L) 26.0 - 34.0 pg   MCHC 29.6 (L) 30.0 - 36.0 g/dL   RDW 95.6 (H) 21.3 - 08.6 %   Platelets 344 150 - 400 K/uL   nRBC 0.0 0.0 - 0.2 %   Dg Chest Portable 1 View  Result Date: 03/03/2019 CLINICAL DATA:  Acute shortness of breath and cough for 1 day. EXAM: PORTABLE CHEST 1 VIEW COMPARISON:  11/06/2018 chest CT, chest radiograph and prior studies. FINDINGS: Cardiomegaly and cardiac valve replacement again noted. Pulmonary vascular congestion and interstitial opacities are noted. A small RIGHT pleural effusion is present. No pneumothorax or acute bony abnormality. IMPRESSION: Cardiomegaly with pulmonary vascular congestion and interstitial opacities, favor edema. Small RIGHT pleural effusion. Electronically Signed   By: Harmon Pier M.D.   On: 03/03/2019 20:48     EKG EKG Interpretation  Date/Time:  Saturday March 03 2019 19:49:10 EDT Ventricular Rate:  98 PR Interval:    QRS Duration: 78 QT Interval:  366 QTC Calculation: 468 R Axis:   72 Text  Interpretation:  Sinus rhythm Ventricular premature complex Probable left atrial enlargement Low voltage, precordial leads Nonspecific T abnrm, anterolateral leads No significant change since last tracing Confirmed by Vanetta Mulders [phone]) on 03/03/2019 7:57:51 PM   Radiology Dg Chest Portable 1 View  Result Date: 03/03/2019 CLINICAL DATA:  Acute shortness of breath and cough for 1 day. EXAM: PORTABLE CHEST 1 VIEW COMPARISON:  11/06/2018 chest CT, chest radiograph and prior studies. FINDINGS: Cardiomegaly and cardiac valve replacement again noted. Pulmonary vascular congestion and interstitial opacities are noted. A small RIGHT pleural effusion is present. No pneumothorax or acute bony abnormality. IMPRESSION: Cardiomegaly with pulmonary vascular congestion and interstitial opacities, favor edema. Small RIGHT pleural effusion. Electronically Signed   By: Harmon Pier M.D.   On: 03/03/2019 20:48    Procedures Procedures (including critical care time)  Medications Ordered in ED Medications  LORazepam (ATIVAN) 2 MG/ML injection (has no administration in time range)  LORazepam (ATIVAN) injection 0.5 mg (has no administration in time range)  potassium chloride SA (K-DUR) CR tablet 20 mEq (20 mEq Oral Given 03/03/19 1944)  LORazepam (ATIVAN) injection 0.5 mg (0.5 mg Intravenous Given 03/03/19 1943)  furosemide (LASIX) injection 60 mg (60 mg Intravenous Given 03/03/19 2141)  Initial Impression / Assessment and Plan / ED Course  I have reviewed the triage vital signs and the nursing notes.  Pertinent labs & imaging results that were available during my care of the patient were reviewed by me and considered in my medical decision making (see chart for details).        Pt was given ativan initially at presentation given anxiety/panic episode which greatly improved her anxiety.  Labs reviewed, ekg and cxr also reviewed and discussed with patient.  She was given an IV dose of Lasix 60 mg and had  significant diuresis, urinating approximately 2 L of urine.  She had significant improvement in her perceived shortness of breath.  She had no hypoxia during her visit here.  However,  Pt was ambulated around the dept and developed increased sob, with desaturation to 87%.  She also became very anxious and required additional ativan.  Will plan for admission.    Final Clinical Impressions(s) / ED Diagnoses   Final diagnoses:  Acute on chronic right-sided congestive heart failure [facility](HCC)  Anxiety attack    ED Discharge Orders    None       Victoriano Laindol, Holman Bonsignore, PA-C 03/04/19 0108    Vanetta MuldersZackowski, Scott, MD 03/04/19 [phone]

## 2019-03-03 NOTE — ED Triage Notes (Signed)
Pt with hx of CHF c/o worsening SOB, fever, and productive cough that began last night. Pt states she is coughing up pink froth. Pt also reports anxiety. Denies exposure to COVID.

## 2019-03-04 ENCOUNTER — Encounter (HOSPITAL_COMMUNITY): Payer: Self-pay | Admitting: Internal Medicine

## 2019-03-04 ENCOUNTER — Observation Stay (HOSPITAL_BASED_OUTPATIENT_CLINIC_OR_DEPARTMENT_OTHER): Payer: Self-pay

## 2019-03-04 ENCOUNTER — Other Ambulatory Visit (HOSPITAL_COMMUNITY): Payer: Medicaid Other

## 2019-03-04 DIAGNOSIS — I052 Rheumatic mitral stenosis with insufficiency: Secondary | ICD-10-CM

## 2019-03-04 DIAGNOSIS — I361 Nonrheumatic tricuspid (valve) insufficiency: Secondary | ICD-10-CM

## 2019-03-04 DIAGNOSIS — I509 Heart failure, unspecified: Secondary | ICD-10-CM

## 2019-03-04 DIAGNOSIS — I5031 Acute diastolic (congestive) heart failure: Secondary | ICD-10-CM

## 2019-03-04 DIAGNOSIS — I272 Pulmonary hypertension, unspecified: Secondary | ICD-10-CM

## 2019-03-04 LAB — CBC
HCT: 36.2 % (ref 36.0–46.0)
Hemoglobin: 10.8 g/dL — ABNORMAL LOW (ref 12.0–15.0)
MCH: 24.7 pg — ABNORMAL LOW (ref 26.0–34.0)
MCHC: 29.8 g/dL — ABNORMAL LOW (ref 30.0–36.0)
MCV: 82.8 fL (ref 80.0–100.0)
Platelets: 338 10*3/uL (ref 150–400)
RBC: 4.37 MIL/uL (ref 3.87–5.11)
RDW: 20.9 % — ABNORMAL HIGH (ref 11.5–15.5)
WBC: 6.7 10*3/uL (ref 4.0–10.5)
nRBC: 0 % (ref 0.0–0.2)

## 2019-03-04 LAB — COMPREHENSIVE METABOLIC PANEL
ALT: 11 U/L (ref 0–44)
AST: 18 U/L (ref 15–41)
Albumin: 3.5 g/dL (ref 3.5–5.0)
Alkaline Phosphatase: 61 U/L (ref 38–126)
Anion gap: 6 (ref 5–15)
BUN: 12 mg/dL (ref 6–20)
CO2: 24 mmol/L (ref 22–32)
Calcium: 8.4 mg/dL — ABNORMAL LOW (ref 8.9–10.3)
Chloride: 104 mmol/L (ref 98–111)
Creatinine, Ser: 1.04 mg/dL — ABNORMAL HIGH (ref 0.44–1.00)
GFR calc Af Amer: >60 mL/min (ref >60)
GFR calc non Af Amer: >60 mL/min (ref >60)
Glucose, Bld: 82 mg/dL (ref 70–99)
Potassium: 3.3 mmol/L — ABNORMAL LOW (ref 3.5–5.1)
Sodium: 134 mmol/L — ABNORMAL LOW (ref 135–145)
Total Bilirubin: 0.7 mg/dL (ref 0.3–1.2)
Total Protein: 7.3 g/dL (ref 6.5–8.1)

## 2019-03-04 LAB — ECHOCARDIOGRAM COMPLETE
Height: 64 in
Weight: 3536 oz

## 2019-03-04 LAB — TROPONIN I
Troponin I: <0.03 ng/mL (ref <0.03)
Troponin I: <0.03 ng/mL (ref <0.03)

## 2019-03-04 LAB — TSH: TSH: 3.797 u[IU]/mL (ref 0.350–4.500)

## 2019-03-04 MED ORDER — POTASSIUM CHLORIDE CRYS ER 20 MEQ PO TBCR
40.0000 meq | EXTENDED_RELEASE_TABLET | Freq: Every day | ORAL | Status: DC
  Administered 2019-03-04 – 2019-03-06 (×3): 40 meq via ORAL
  Filled 2019-03-04 (×4): qty 2

## 2019-03-04 MED ORDER — TRAZODONE HCL 50 MG PO TABS
25.0000 mg | ORAL_TABLET | Freq: Every day | ORAL | Status: DC
  Administered 2019-03-04 – 2019-03-05 (×2): 50 mg via ORAL
  Filled 2019-03-04 (×2): qty 1

## 2019-03-04 MED ORDER — METOPROLOL SUCCINATE ER 25 MG PO TB24: 37.5000 mg | ORAL_TABLET | Freq: Every day | ORAL | Status: DC

## 2019-03-04 MED ORDER — SERTRALINE HCL 100 MG PO TABS
100.0000 mg | ORAL_TABLET | Freq: Every day | ORAL | Status: DC
  Administered 2019-03-04 – 2019-03-08 (×5): 100 mg via ORAL
  Filled 2019-03-04 (×5): qty 1

## 2019-03-04 MED ORDER — ASPIRIN 81 MG PO CHEW
81.0000 mg | CHEWABLE_TABLET | ORAL | Status: AC
  Administered 2019-03-05: 06:00:00 81 mg via ORAL

## 2019-03-04 MED ORDER — LORAZEPAM 2 MG/ML IJ SOLN
INTRAMUSCULAR | Status: AC
  Administered 2019-03-04: 0.5 mg via INTRAVENOUS
  Filled 2019-03-04: qty 1

## 2019-03-04 MED ORDER — FUROSEMIDE 10 MG/ML IJ SOLN
40.0000 mg | Freq: Two times a day (BID) | INTRAMUSCULAR | Status: DC
  Administered 2019-03-04 – 2019-03-06 (×5): 40 mg via INTRAVENOUS
  Filled 2019-03-04 (×6): qty 4

## 2019-03-04 MED ORDER — TRAZODONE HCL 50 MG PO TABS
25.0000 mg | ORAL_TABLET | Freq: Once | ORAL | Status: AC
  Administered 2019-03-04: 25 mg via ORAL
  Filled 2019-03-04: qty 1

## 2019-03-04 MED ORDER — METOPROLOL SUCCINATE ER 25 MG PO TB24
37.5000 mg | ORAL_TABLET | Freq: Every day | ORAL | Status: DC
  Administered 2019-03-04 – 2019-03-08 (×5): 37.5 mg via ORAL
  Filled 2019-03-04 (×6): qty 2

## 2019-03-04 MED ORDER — LORAZEPAM 0.5 MG PO TABS
0.5000 mg | ORAL_TABLET | Freq: Four times a day (QID) | ORAL | Status: DC | PRN
  Administered 2019-03-04 – 2019-03-06 (×4): 0.5 mg via ORAL
  Filled 2019-03-04 (×4): qty 1

## 2019-03-04 MED ORDER — QUETIAPINE FUMARATE 25 MG PO TABS
50.0000 mg | ORAL_TABLET | Freq: Once | ORAL | Status: AC
  Administered 2019-03-04: 50 mg via ORAL
  Filled 2019-03-04: qty 2

## 2019-03-04 MED ORDER — POTASSIUM CHLORIDE CRYS ER 20 MEQ PO TBCR
40.0000 meq | EXTENDED_RELEASE_TABLET | Freq: Once | ORAL | Status: AC
  Administered 2019-03-04: 14:00:00 40 meq via ORAL
  Filled 2019-03-04: qty 2

## 2019-03-04 MED ORDER — LORAZEPAM 2 MG/ML IJ SOLN
0.5000 mg | Freq: Once | INTRAMUSCULAR | Status: AC
  Administered 2019-03-04: 0.5 mg via INTRAVENOUS

## 2019-03-04 MED ORDER — ALBUTEROL SULFATE (2.5 MG/3ML) 0.083% IN NEBU: 3.0000 mL | INHALATION_SOLUTION | Freq: Four times a day (QID) | RESPIRATORY_TRACT | Status: DC | PRN

## 2019-03-04 MED ORDER — ENOXAPARIN SODIUM 60 MG/0.6ML ~~LOC~~ SOLN
0.5000 mg/kg | SUBCUTANEOUS | Status: DC
  Administered 2019-03-04 – 2019-03-08 (×4): 50 mg via SUBCUTANEOUS
  Filled 2019-03-04: qty 0.5
  Filled 2019-03-04: qty 0.6
  Filled 2019-03-04 (×4): qty 0.5

## 2019-03-04 MED ORDER — SACCHAROMYCES BOULARDII 250 MG PO CAPS
250.0000 mg | ORAL_CAPSULE | Freq: Every day | ORAL | Status: DC
  Administered 2019-03-05 – 2019-03-08 (×4): 250 mg via ORAL
  Filled 2019-03-04 (×4): qty 1

## 2019-03-04 MED ORDER — DOCUSATE SODIUM 100 MG PO CAPS: 100.0000 mg | ORAL_CAPSULE | Freq: Every day | ORAL | Status: DC | PRN

## 2019-03-04 MED ORDER — QUETIAPINE FUMARATE 25 MG PO TABS
50.0000 mg | ORAL_TABLET | Freq: Every day | ORAL | Status: DC
  Administered 2019-03-04 – 2019-03-07 (×4): 50 mg via ORAL
  Filled 2019-03-04 (×4): qty 2

## 2019-03-04 NOTE — H&P (View-Only) (Signed)
Cardiology Consultation:   Patient ID: Theresa Crawford MRN: [id number]; DOB: 1981-04-21  Admit date: 03/03/2019 Date of Consult: 03/04/2019  Primary Care Provider: Jerrell BelfastHouse, [name], [name] Primary Cardiologist: Prentice DockerSuresh Koneswaran, MD  Primary Electrophysiologist:  None    Patient Profile:   Theresa Crawford is a 38 y.o. female with a hx of mitral valve [place] in [place], annuloplasty ring with subsequent severe mitral stenosis who is being seen today for the evaluation of diastolic heart failure at the request of Dr. Nelson ChimesAmin.  History of Present Illness:   [name] is known well to Dr. Purvis SheffieldKoneswaran with prior mitral valve repair annul[place] ring in [place] in 2014 with subsequent echocardiograms demonstrating what appears to be fairly severe mitral stenosis with gradients of 11 to 13 mmHg mean.  Physiologically, this seems to have resulted, in combination with possible history of sarcoidosis, pulmonary hypertension with most recent echocardiogram showing quite severe elevations, 88 mmHg estimated pulmonary artery pressures.  She has been experiencing increasing shortness of breath, orthopnea, chest x-ray shows pulmonary vascular congestion, mildly elevated BNP.  Cardiac enzymes are normal, diuresis was performed and continues with mild improvement.  Her shortness of breath is along with cough and white sputum and slight fever subjectively with slight chest discomfort was noted on history.  COVID-19 negative.  Chest x-ray demonstrated cardiomegaly pulmonary vascular congestion interstitial opacities which favor edema with small right pleural effusion.  In July 2019 she underwent consultation by Dr. Cornelius Moraswen, cardiothoracic surgery, and underwent dental extractions later in July for preparation of valve procedure.  She then unfortunately had mental breakdown and this was postponed.  She is ready to consider once again.    Past Medical History:  Diagnosis Date   Anxiety    Arthritis    "lower back" (04/04/2018)   Chronic bronchitis (HCC)    Chronic diastolic CHF (congestive heart failure) (HCC)    Chronic lower back pain    DDD (degenerative disc disease), lumbar    Depression    GERD (gastroesophageal reflux disease)    Headache    "weekly" (04/04/2018)   Heart murmur    Hypertension    Mitral valve disease    Normocytic anemia    Obesity    Palpitations    Pericarditis    Premature atrial contractions    Pulmonary hypertension (HCC)    PVC's (premature ventricular contractions)    a. h/o palpitations with event monitor in 03/2017 showing NSR with PACs/PVCs.   Recurrent mitral valve stenosis and regurgitation s/p mitral valve repair    S/P mitral valve repair 03/27/2013   [place] complex valvuloplasty including resuspension of entire posterior leaflet using Gore-tex neochords and 30 mm Edwards Physio ring annuloplasty   Sarcoidosis    Tobacco abuse    Tricuspid regurgitation     Past Surgical History:  Procedure Laterality Date   ABDOMINAL HERNIA REPAIR  ~ 2011   ABDOMINAL HYSTERECTOMY  2011   "partial"; PID w/problem with fallopian tubes, had left fallopian and left ovary removed, pt still having periods   CARDIAC CATHETERIZATION  2014   CESAREAN SECTION  2004; 2009   HERNIA REPAIR     MITRAL VALVE REPAIR  03/27/2013   Dr. [place]arcus [place] - complex valvuloplasty including resuspension of posterior leaflet with 30 mm CE Physio ring annuloplasty   MULTIPLE EXTRACTIONS WITH ALVEOLOPLASTY N/A 04/03/2018   Procedure: Extraction of tooth #30 with alveoloplasty and gross debridement of remaining teeth;  Surgeon:  Charlynne PanderKulinski, Ronald F, DDS;  [facility];  Service: Oral Surgery;  Laterality: N/A;   PARTIAL HYSTERECTOMY  2011   PID w/problem with fallopian tubes, had left fallopian and left ovary removed, pt still having periods   TEE WITHOUT CARDIOVERSION N/A 05/26/2016   Procedure:  TRANSESOPHAGEAL ECHOCARDIOGRAM (TEE);  Surgeon: Laqueta LindenSuresh A Koneswaran, MD;  Location: [facility];  Service: Cardiovascular;  Laterality: N/A;   TEE WITHOUT CARDIOVERSION N/A 01/25/2018   Procedure: TRANSESOPHAGEAL ECHOCARDIOGRAM (TEE) WITH PROPOFOL;  Surgeon: Jonelle SidleMcDowell, Samuel G, MD;  Location: [facility];  Service: Cardiovascular;  Laterality: N/A;     Home Medications:  Prior to Admission medications   Medication Sig Start Date End Date Taking? Authorizing Provider  albuterol (PROVENTIL HFA;VENTOLIN HFA) 108 (90 Base) MCG/ACT inhaler Inhale 2 puffs into the lungs every 6 (six) hours as needed for wheezing or shortness of breath.    [provider]  bumetanide (BUMEX) 1 MG tablet Take 2 tablets (2 mg total) by mouth 2 (two) times daily. 02/28/19   Laqueta LindenKoneswaran, Suresh A, MD  docusate sodium (COLACE) 100 MG capsule Take 100 mg by mouth daily as needed for moderate constipation.     [provider]  EPINEPHrine (EPIPEN 2-PAK) 0.3 mg/0.3 mL IJ SOAJ injection Inject 0.3 Units into the muscle once.     [provider]  ferrous sulfate 325 (65 FE) MG tablet Take 1 tablet (325 mg total) by mouth 2 (two) times daily with a meal. Patient taking differently: Take 325 mg by mouth daily with breakfast.  03/08/18   Philip AspenHernandez Acosta, Limmie PatriciaEstela Y, MD  LORazepam (ATIVAN) 0.5 MG tablet Take 1 tablet (0.5 mg total) by mouth every 6 (six) hours as needed for anxiety or sleep. 04/15/18   Oneta RackLewis, Tanika N, NP  metoprolol succinate (TOPROL-XL) 25 MG 24 hr tablet Take 1.5 tablets (37.5 mg total) by mouth daily. 02/28/19   Laqueta LindenKoneswaran, Suresh A, MD  potassium chloride SA (K-DUR) 20 MEQ tablet Take 2 tablets (40 mEq total) by mouth daily. Take 2 tablets daily 02/28/19   Laqueta LindenKoneswaran, Suresh A, MD  Probiotic Product (PROBIOTIC PO) Take 1 capsule by mouth daily.    [provider]  QUEtiapine (SEROQUEL) 50 MG tablet Take 1 tablet (50 mg total) by mouth at bedtime. 04/15/18   Oneta RackLewis, Tanika N, NP    sertraline (ZOLOFT) 100 MG tablet Take 1 tablet (100 mg total) by mouth daily. 04/16/18   Oneta RackLewis, Tanika N, NP  traZODone (DESYREL) 50 MG tablet Take 0.5 tablets (25 mg total) by mouth at bedtime. Patient taking differently: Take 25-50 mg by mouth at bedtime.  04/15/18   Oneta RackLewis, Tanika N, NP    Inpatient Medications: Scheduled Meds:  enoxaparin (LOVENOX) injection  0.5 mg/kg Subcutaneous Q24H   furosemide  40 mg Intravenous BID   metoprolol succinate  37.5 mg Oral Daily   potassium chloride SA  40 mEq Oral Daily   potassium chloride  40 mEq Oral Once   Probiotic   Oral Daily   QUEtiapine  50 mg Oral QHS   sertraline  100 mg Oral Daily   traZODone  25-50 mg Oral QHS   Continuous Infusions:  PRN Meds: albuterol, docusate sodium, LORazepam  Allergies:    Allergies  Allergen Reactions   Bee Venom Anaphylaxis   Lisinopril Shortness Of Breath, Swelling and Other (See Comments)    Throat swelling   Penicillins Shortness Of Breath, Swelling and Other (See Comments)    Has patient had a PCN  reaction causing immediate rash, facial/tongue/throat swelling, SOB or lightheadedness with hypotension: Yes Has patient had a PCN reaction causing severe rash involving mucus membranes or skin necrosis: Yes Has patient had a PCN reaction that required hospitalization No Has patient had a PCN reaction occurring within the last 10 years: No If all of the above answers are "NO", then may proceed with Cephalosporin use.    Perflutren Lipid Microsphere Shortness Of Breath and Other (See Comments)    Other reaction(s): Chest Pain/Tightness   Shellfish Allergy Anaphylaxis   Contrast Media [Iodinated Diagnostic Agents] Hives, Itching and Other (See Comments)    Itching and hives to throat, neck, face and arms.   No difficulty breathing.   This was given at [facility] approximately 2015. Contrast Dye    Social History:   Social History   Socioeconomic History   Marital  status: Single    Spouse name: Not on file   Number of children: 2   Years of education: Not on file   Highest education level: Not on file  Occupational History   Not on file  Social Needs   Financial resource strain: Not on file   Food insecurity    Worry: Not on file    Inability: Not on file   Transportation needs    Medical: Not on file    Non-medical: Not on file  Tobacco Use   Smoking status: Current Some Day Smoker    Packs/day: 0.25    Years: 24.00    Pack years: 6.00    Types: Cigarettes    Start date: 11/03/1999   Smokeless tobacco: Never Used  Substance and Sexual Activity   Alcohol use: Yes    Comment: 04/04/2018 "1-2 beers/month; if that"   Drug use: Not Currently   Sexual activity: Not Currently  Lifestyle   Physical activity    Days per week: Not on file    Minutes per session: Not on file   Stress: Not on file  Relationships   Social connections    Talks on phone: Not on file    Gets together: Not on file    Attends religious service: Not on file    Active member of club or organization: Not on file    Attends meetings of clubs or organizations: Not on file    Relationship status: Not on file   Intimate partner violence    Fear of current or ex partner: Not on file    Emotionally abused: Not on file    Physically abused: Not on file    Forced sexual activity: Not on file  Other Topics Concern   Not on file  Social History Narrative   Not on file    Family History:    Family History  Problem Relation Age of Onset   Heart failure Father        just received LVAD   Heart disease Paternal Grandfather        stent placement     ROS:  Please see the history of present illness.  Depressed mood, no bleeding, no syncope All other ROS reviewed and negative.     Physical Exam/Data:   Vitals:   03/04/19 0622 03/04/19 0700 03/04/19 0840 03/04/19 0925  BP: (!) 127/101 123/83 110/69 109/69  Pulse: 83 81 86 86  Resp: 18 (!) 24  18 (!) 26  Temp:   97.8 F (36.6 C) 97.9 F (36.6 C)  TempSrc:   Oral Oral  SpO2: Marland Kitchen)  89% 96% 97% 95%  Weight:      Height:       No intake or output data in the 24 hours ending 03/04/19 1313 Last 3 Weights 03/03/2019 02/28/2019 11/06/2018  Weight (lbs) 221 lb 219 lb 223 lb 1.7 oz  Weight (kg) 100.245 kg 99.338 kg 101.2 kg  Some encounter information is confidential and restricted. Go to Review Flowsheets activity to see all data.     Body mass index is 37.93 kg/m.  General:  Well nourished, well developed, in no acute distress HEENT: normal Lymph: no adenopathy Neck: no JVD Endocrine:  No thryomegaly Vascular: No carotid bruits; FA pulses 2+ bilaterally without bruits  Cardiac:  normal S1, S2; RRR; soft systolic/diastolic murmur appreciated Lungs:  clear to auscultation bilaterally, no wheezing, rhonchi or rales  Abd: soft, nontender, no hepatomegaly  Ext: no edema Musculoskeletal:  No deformities, BUE and BLE strength normal and equal Skin: warm and dry  Neuro:  CNs 2-12 intact, no focal abnormalities noted Psych:  Normal affect   EKG:  The EKG was personally reviewed and demonstrates: Sinus rhythm, 99 bpm no ischemic changes  Telemetry:  Telemetry was personally reviewed and demonstrates: Sinus rhythm, no adverse arrhythmias  Relevant CV Studies:  ECHO 03/04/19:  1. The left ventricle has normal systolic function with an ejection fraction of 60-65%. The cavity size was normal. Left ventricular diastolic Doppler parameters are consistent with pseudonormalization. There is right ventricular volume and pressure  overload. No evidence of left ventricular regional wall motion abnormalities.  2. The right ventricle has normal systolic function. The cavity was moderately enlarged. There is no increase in right ventricular wall thickness. Right ventricular systolic pressure is severely elevated with an estimated pressure of 88.1 mmHg.  3. Left atrial size was moderately dilated.  4.  The mitral valve is myxomatous. Moderate thickening of the mitral valve leaflet. Severe mitral valve stenosis.  5. Post mitral valve repair, annuloplasty ring. Post repair mitral stenosis (mean gradient 13mmHg - severe).  6. Tricuspid valve regurgitation is moderate.  7. The aortic valve is tricuspid. Aortic valve regurgitation was not assessed by color flow Doppler.  8. The inferior vena cava was dilated in size with <50% respiratory variability.   Laboratory Data:  Chemistry Recent Labs  Lab 03/03/19 1914 03/04/19 0728  NA 137 134*  K 3.8 3.3*  CL 108 104  CO2 22 24  GLUCOSE 102* 82  BUN 12 12  CREATININE 0.83 1.04*  CALCIUM 8.7* 8.4*  GFRNONAA >60 >60  GFRAA >60 >60  ANIONGAP 7 6    Recent Labs  Lab 03/04/19 0728  PROT 7.3  ALBUMIN 3.5  AST 18  ALT 11  ALKPHOS 61  BILITOT 0.7   Hematology Recent Labs  Lab 03/03/19 1914 03/04/19 0728  WBC 7.4 6.7  RBC 4.62 4.37  HGB 11.0* 10.8*  HCT 37.2 36.2  MCV 80.5 82.8  MCH 23.8* 24.7*  MCHC 29.6* 29.8*  RDW 21.3* 20.9*  PLT 344 338   Cardiac Enzymes Recent Labs  Lab 03/04/19 0229 03/04/19 0728  TROPONINI <0.03 <0.03   No results for input(s): TROPIPOC in the last 168 hours.  BNP Recent Labs  Lab 03/03/19 1914  BNP 528.0*    DDimer No results for input(s): DDIMER in the last 168 hours.  Radiology/Studies:  Dg Chest Portable 1 View  Result Date: 03/03/2019 CLINICAL DATA:  Acute shortness of breath and cough for 1 day. EXAM: PORTABLE CHEST 1 VIEW COMPARISON:  11/06/2018 chest  CT, chest radiograph and prior studies. FINDINGS: Cardiomegaly and cardiac valve replacement again noted. Pulmonary vascular congestion and interstitial opacities are noted. A small RIGHT pleural effusion is present. No pneumothorax or acute bony abnormality. IMPRESSION: Cardiomegaly with pulmonary vascular congestion and interstitial opacities, favor edema. Small RIGHT pleural effusion. Electronically Signed   By: Margarette Canada M.D.   On:  03/03/2019 20:48    Assessment and Plan:   Acute diastolic heart failure in the setting of post mitral valve repair severe mitral stenosis and subsequent likely partially secondary pulmonary hypertension - I spoke with Dr. Roxy Manns of cardiothoracic surgery.  She saw him back in July of last year.  She underwent dental extraction later in July 2019 in preparation for valve replacement.  She then had a mental breakdown and was admitted for depression and suicidal ideation and valve surgery was postponed.  On 02/28/2019 when she spoke to [facility] [place] she was ready to reconsider valve surgery.  She states the same today.  She understands the possibility of metallic valve and Coumadin.  We discussed proceeding with right and left heart catheterization as well as TEE.  She understands that both of these procedures need to be done before surgical consideration.  We will go ahead and place her on the cath board tomorrow.  If possible, it would be great if she could have TEE then cardiac catheterization both done on the same day but this certainly may not be a scheduling possibility.  Risks and benefits including stroke heart attack death renal impairment bleeding have been discussed.  Dr. Roxy Manns will then review the studies and be able to plan surgical procedure.  After discussing this with her, she was a bit teary-eyed but understands. - Continue with IV Lasix 40 mg twice daily with potassium supplementation with goal diuresis net -1 to 2 L daily. -Continue with Toprol - Cardiac biomarkers are normal.  Anxiety/depression - On Zoloft trazodone Seroquel Ativan.  Per primary team     For questions or updates, please contact [place] Please consult [email or web address] for contact info under     Signed, Candee Furbish, MD  03/04/2019 1:13 PM

## 2019-03-04 NOTE — Consult Note (Signed)
Cardiology Consultation:   Patient ID: Theresa Crawford MRN: [id number]; DOB: 1981-04-21  Admit date: 03/03/2019 Date of Consult: 03/04/2019  Primary Care Provider: Jerrell BelfastHouse, [name], [name] Primary Cardiologist: Prentice DockerSuresh Koneswaran, MD  Primary Electrophysiologist:  None    Patient Profile:   Theresa Crawford is a 38 y.o. female with a hx of mitral valve repair in [place], annuloplasty ring with subsequent severe mitral stenosis who is being seen today for the evaluation of diastolic heart failure at the request of Dr. Nelson ChimesAmin.  History of Present Illness:   [name] he is known well to Dr. Purvis SheffieldKoneswaran with prior mitral valve repair annul[place] ring in [place] in 2014 with subsequent echocardiograms demonstrating what appears to be fairly severe mitral stenosis with gradients of 11 to 13 mmHg mean.  Physiologically, this seems to have resulted, in combination with possible history of sarcoidosis, pulmonary hypertension with most recent echocardiogram showing quite severe elevations, 88 mmHg estimated pulmonary artery pressures.  She has been experiencing increasing shortness of breath, orthopnea, chest x-ray shows pulmonary vascular congestion, mildly elevated BNP.  Cardiac enzymes are normal, diuresis was performed and continues with mild improvement.  Her shortness of breath is along with cough and white sputum and slight fever subjectively with slight chest discomfort was noted on history.  COVID-19 negative.  Chest x-ray demonstrated cardiomegaly pulmonary vascular congestion interstitial opacities which favor edema with small right pleural effusion.  In July 2019 she underwent consultation by Dr. Cornelius Moraswen, cardiothoracic surgery, and underwent dental extractions later in July for preparation of valve procedure.  She then unfortunately had mental breakdown and this was postponed.  She is ready to consider once again.    Past Medical History:  Diagnosis Date   Anxiety    Arthritis    "lower back" (04/04/2018)   Chronic bronchitis (HCC)    Chronic diastolic CHF (congestive heart failure) (HCC)    Chronic lower back pain    DDD (degenerative disc disease), lumbar    Depression    GERD (gastroesophageal reflux disease)    Headache    "weekly" (04/04/2018)   Heart murmur    Hypertension    Mitral valve disease    Normocytic anemia    Obesity    Palpitations    Pericarditis    Premature atrial contractions    Pulmonary hypertension (HCC)    PVC's (premature ventricular contractions)    a. h/o palpitations with event monitor in 03/2017 showing NSR with PACs/PVCs.   Recurrent mitral valve stenosis and regurgitation s/p mitral valve repair    S/P mitral valve repair 03/27/2013   [place] complex valvuloplasty including resuspension of entire posterior leaflet using Gore-tex neochords and 30 mm Edwards Physio ring annuloplasty   Sarcoidosis    Tobacco abuse    Tricuspid regurgitation     Past Surgical History:  Procedure Laterality Date   ABDOMINAL HERNIA REPAIR  ~ 2011   ABDOMINAL HYSTERECTOMY  2011   "partial"; PID w/problem with fallopian tubes, had left fallopian and left ovary removed, pt still having periods   CARDIAC CATHETERIZATION  2014   CESAREAN SECTION  2004; 2009   HERNIA REPAIR     MITRAL VALVE REPAIR  03/27/2013   Dr. Darcus [place]Mark [place] - complex valvuloplasty including resuspension of posterior leaflet with 30 mm CE Physio ring annuloplasty   MULTIPLE EXTRACTIONS WITH ALVEOLOPLASTY N/A 04/03/2018   Procedure: Extraction of tooth #30 with alveoloplasty and gross debridement of remaining teeth;  Surgeon:  Charlynne PanderKulinski, Ronald F, DDS;  [facility];  Service: Oral Surgery;  Laterality: N/A;   PARTIAL HYSTERECTOMY  2011   PID w/problem with fallopian tubes, had left fallopian and left ovary removed, pt still having periods   TEE WITHOUT CARDIOVERSION N/A 05/26/2016   Procedure:  TRANSESOPHAGEAL ECHOCARDIOGRAM (TEE);  Surgeon: Laqueta LindenSuresh A Koneswaran, MD;  Location: [facility];  Service: Cardiovascular;  Laterality: N/A;   TEE WITHOUT CARDIOVERSION N/A 01/25/2018   Procedure: TRANSESOPHAGEAL ECHOCARDIOGRAM (TEE) WITH PROPOFOL;  Surgeon: Jonelle SidleMcDowell, Samuel G, MD;  Location: [facility];  Service: Cardiovascular;  Laterality: N/A;     Home Medications:  Prior to Admission medications   Medication Sig Start Date End Date Taking? Authorizing Provider  albuterol (PROVENTIL HFA;VENTOLIN HFA) 108 (90 Base) MCG/ACT inhaler Inhale 2 puffs into the lungs every 6 (six) hours as needed for wheezing or shortness of breath.    [provider]  bumetanide (BUMEX) 1 MG tablet Take 2 tablets (2 mg total) by mouth 2 (two) times daily. 02/28/19   Laqueta LindenKoneswaran, Suresh A, MD  docusate sodium (COLACE) 100 MG capsule Take 100 mg by mouth daily as needed for moderate constipation.     [provider]  EPINEPHrine (EPIPEN 2-PAK) 0.3 mg/0.3 mL IJ SOAJ injection Inject 0.3 Units into the muscle once.     [provider]  ferrous sulfate 325 (65 FE) MG tablet Take 1 tablet (325 mg total) by mouth 2 (two) times daily with a meal. Patient taking differently: Take 325 mg by mouth daily with breakfast.  03/08/18   Philip AspenHernandez Acosta, Limmie PatriciaEstela Y, MD  LORazepam (ATIVAN) 0.5 MG tablet Take 1 tablet (0.5 mg total) by mouth every 6 (six) hours as needed for anxiety or sleep. 04/15/18   Oneta RackLewis, Tanika N, NP  metoprolol succinate (TOPROL-XL) 25 MG 24 hr tablet Take 1.5 tablets (37.5 mg total) by mouth daily. 02/28/19   Laqueta LindenKoneswaran, Suresh A, MD  potassium chloride SA (K-DUR) 20 MEQ tablet Take 2 tablets (40 mEq total) by mouth daily. Take 2 tablets daily 02/28/19   Laqueta LindenKoneswaran, Suresh A, MD  Probiotic Product (PROBIOTIC PO) Take 1 capsule by mouth daily.    [provider]  QUEtiapine (SEROQUEL) 50 MG tablet Take 1 tablet (50 mg total) by mouth at bedtime. 04/15/18   Oneta RackLewis, Tanika N, NP    sertraline (ZOLOFT) 100 MG tablet Take 1 tablet (100 mg total) by mouth daily. 04/16/18   Oneta RackLewis, Tanika N, NP  traZODone (DESYREL) 50 MG tablet Take 0.5 tablets (25 mg total) by mouth at bedtime. Patient taking differently: Take 25-50 mg by mouth at bedtime.  04/15/18   Oneta RackLewis, Tanika N, NP    Inpatient Medications: Scheduled Meds:  enoxaparin (LOVENOX) injection  0.5 mg/kg Subcutaneous Q24H   furosemide  40 mg Intravenous BID   metoprolol succinate  37.5 mg Oral Daily   potassium chloride SA  40 mEq Oral Daily   potassium chloride  40 mEq Oral Once   Probiotic   Oral Daily   QUEtiapine  50 mg Oral QHS   sertraline  100 mg Oral Daily   traZODone  25-50 mg Oral QHS   Continuous Infusions:  PRN Meds: albuterol, docusate sodium, LORazepam  Allergies:    Allergies  Allergen Reactions   Bee Venom Anaphylaxis   Lisinopril Shortness Of Breath, Swelling and Other (See Comments)    Throat swelling   Penicillins Shortness Of Breath, Swelling and Other (See Comments)    Has patient had a PCN  reaction causing immediate rash, facial/tongue/throat swelling, SOB or lightheadedness with hypotension: Yes °Has patient had a PCN reaction causing severe rash involving mucus membranes or skin necrosis: Yes °Has patient had a PCN reaction that required hospitalization No °Has patient had a PCN reaction occurring within the last 10 years: No °If all of the above answers are "NO", then may proceed with Cephalosporin use. °  °• Perflutren Lipid Microsphere Shortness Of Breath and Other (See Comments)  °  Other reaction(s): Chest Pain/Tightness  °• Shellfish Allergy Anaphylaxis  °• Contrast Media [Iodinated Diagnostic Agents] Hives, Itching and Other (See Comments)  °  Itching and hives to throat, neck, face and arms.   No difficulty breathing.   This was given at [facility] approximately 2015. Contrast Dye  ° ° °Social History:   °Social History  ° °Socioeconomic History  °• Marital  status: Single  °  Spouse name: Not on file  °• Number of children: 2  °• Years of education: Not on file  °• Highest education level: Not on file  °Occupational History  °• Not on file  °Social Needs  °• Financial resource strain: Not on file  °• Food insecurity  °  Worry: Not on file  °  Inability: Not on file  °• Transportation needs  °  Medical: Not on file  °  Non-medical: Not on file  °Tobacco Use  °• Smoking status: Current Some Day Smoker  °  Packs/day: 0.25  °  Years: 24.00  °  Pack years: 6.00  °  Types: Cigarettes  °  Start date: 11/03/1999  °• Smokeless tobacco: Never Used  °Substance and Sexual Activity  °• Alcohol use: Yes  °  Comment: 04/04/2018 "1-2 beers/month; if that"  °• Drug use: Not Currently  °• Sexual activity: Not Currently  °Lifestyle  °• Physical activity  °  Days per week: Not on file  °  Minutes per session: Not on file  °• Stress: Not on file  °Relationships  °• Social connections  °  Talks on phone: Not on file  °  Gets together: Not on file  °  Attends religious service: Not on file  °  Active member of club or organization: Not on file  °  Attends meetings of clubs or organizations: Not on file  °  Relationship status: Not on file  °• Intimate partner violence  °  Fear of current or ex partner: Not on file  °  Emotionally abused: Not on file  °  Physically abused: Not on file  °  Forced sexual activity: Not on file  °Other Topics Concern  °• Not on file  °Social History Narrative  °• Not on file  °  °Family History:   ° °Family History  °Problem Relation Age of Onset  °• Heart failure Father   °     just received LVAD  °• Heart disease Paternal Grandfather   °     stent placement  °  ° °ROS:  °Please see the history of present illness.  °Depressed mood, no bleeding, no syncope °All other ROS reviewed and negative.    ° °Physical Exam/Data:  ° °Vitals:  ° 03/04/19 0622 03/04/19 0700 03/04/19 0840 03/04/19 0925  °BP: (!) 127/101 123/83 110/69 109/69  °Pulse: 83 81 86 86  °Resp: 18 (!) 24  18 (!) 26  °Temp:   97.8 °F (36.6 °C) 97.9 °F (36.6 °C)  °TempSrc:   Oral Oral  °SpO2: (!)   89% 96% 97% 95%  Weight:      Height:       No intake or output data in the 24 hours ending 03/04/19 1313 Last 3 Weights 03/03/2019 02/28/2019 11/06/2018  Weight (lbs) 221 lb 219 lb 223 lb 1.7 oz  Weight (kg) 100.245 kg 99.338 kg 101.2 kg  Some encounter information is confidential and restricted. Go to Review Flowsheets activity to see all data.     Body mass index is 37.93 kg/m.  General:  Well nourished, well developed, in no acute distress HEENT: normal Lymph: no adenopathy Neck: no JVD Endocrine:  No thryomegaly Vascular: No carotid bruits; FA pulses 2+ bilaterally without bruits  Cardiac:  normal S1, S2; RRR; soft systolic/diastolic murmur appreciated Lungs:  clear to auscultation bilaterally, no wheezing, rhonchi or rales  Abd: soft, nontender, no hepatomegaly  Ext: no edema Musculoskeletal:  No deformities, BUE and BLE strength normal and equal Skin: warm and dry  Neuro:  CNs 2-12 intact, no focal abnormalities noted Psych:  Normal affect   EKG:  The EKG was personally reviewed and demonstrates: Sinus rhythm, 99 bpm no ischemic changes  Telemetry:  Telemetry was personally reviewed and demonstrates: Sinus rhythm, no adverse arrhythmias  Relevant CV Studies:  ECHO 03/04/19:  1. The left ventricle has normal systolic function with an ejection fraction of 60-65%. The cavity size was normal. Left ventricular diastolic Doppler parameters are consistent with pseudonormalization. There is right ventricular volume and pressure  overload. No evidence of left ventricular regional wall motion abnormalities.  2. The right ventricle has normal systolic function. The cavity was moderately enlarged. There is no increase in right ventricular wall thickness. Right ventricular systolic pressure is severely elevated with an estimated pressure of 88.1 mmHg.  3. Left atrial size was moderately dilated.  4.  The mitral valve is myxomatous. Moderate thickening of the mitral valve leaflet. Severe mitral valve stenosis.  5. Post mitral valve repair, annuloplasty ring. Post repair mitral stenosis (mean gradient 13mmHg - severe).  6. Tricuspid valve regurgitation is moderate.  7. The aortic valve is tricuspid. Aortic valve regurgitation was not assessed by color flow Doppler.  8. The inferior vena cava was dilated in size with <50% respiratory variability.   Laboratory Data:  Chemistry Recent Labs  Lab 03/03/19 1914 03/04/19 0728  NA 137 134*  K 3.8 3.3*  CL 108 104  CO2 22 24  GLUCOSE 102* 82  BUN 12 12  CREATININE 0.83 1.04*  CALCIUM 8.7* 8.4*  GFRNONAA >60 >60  GFRAA >60 >60  ANIONGAP 7 6    Recent Labs  Lab 03/04/19 0728  PROT 7.3  ALBUMIN 3.5  AST 18  ALT 11  ALKPHOS 61  BILITOT 0.7   Hematology Recent Labs  Lab 03/03/19 1914 03/04/19 0728  WBC 7.4 6.7  RBC 4.62 4.37  HGB 11.0* 10.8*  HCT 37.2 36.2  MCV 80.5 82.8  MCH 23.8* 24.7*  MCHC 29.6* 29.8*  RDW 21.3* 20.9*  PLT 344 338   Cardiac Enzymes Recent Labs  Lab 03/04/19 0229 03/04/19 0728  TROPONINI <0.03 <0.03   No results for input(s): TROPIPOC in the last 168 hours.  BNP Recent Labs  Lab 03/03/19 1914  BNP 528.0*    DDimer No results for input(s): DDIMER in the last 168 hours.  Radiology/Studies:  Dg Chest Portable 1 View  Result Date: 03/03/2019 CLINICAL DATA:  Acute shortness of breath and cough for 1 day. EXAM: PORTABLE CHEST 1 VIEW COMPARISON:  11/06/2018 chest  CT, chest radiograph and prior studies. FINDINGS: Cardiomegaly and cardiac valve replacement again noted. Pulmonary vascular congestion and interstitial opacities are noted. A small RIGHT pleural effusion is present. No pneumothorax or acute bony abnormality. IMPRESSION: Cardiomegaly with pulmonary vascular congestion and interstitial opacities, favor edema. Small RIGHT pleural effusion. Electronically Signed   By: Margarette Canada M.D.   On:  03/03/2019 20:48    Assessment and Plan:   Acute diastolic heart failure in the setting of post mitral valve repair severe mitral stenosis and subsequent likely partially secondary pulmonary hypertension - I spoke with Dr. Roxy Manns of cardiothoracic surgery.  She saw him back in July of last year.  She underwent dental extraction later in July 2019 in preparation for valve replacement.  She then had a mental breakdown and was admitted for depression and suicidal ideation and valve surgery was postponed.  On 02/28/2019 when she spoke to [facility] [place] she was ready to reconsider valve surgery.  She states the same today.  She understands the possibility of metallic valve and Coumadin.  We discussed proceeding with right and left heart catheterization as well as TEE.  She understands that both of these procedures need to be done before surgical consideration.  We will go ahead and place her on the cath board tomorrow.  If possible, it would be great if she could have TEE then cardiac catheterization both done on the same day but this certainly may not be a scheduling possibility.  Risks and benefits including stroke heart attack death renal impairment bleeding have been discussed.  Dr. Roxy Manns will then review the studies and be able to plan surgical procedure.  After discussing this with her, she was a bit teary-eyed but understands. - Continue with IV Lasix 40 mg twice daily with potassium supplementation with goal diuresis net -1 to 2 L daily. -Continue with Toprol - Cardiac biomarkers are normal.  Anxiety/depression - On Zoloft trazodone Seroquel Ativan.  Per primary team     For questions or updates, please contact [place] Please consult [email or web address] for contact info under     Signed, Candee Furbish, MD  03/04/2019 1:13 PM

## 2019-03-04 NOTE — ED Notes (Signed)
Pt ambulated down hall & around nurses station, O2 did not fall below 87%, shortly after pt returned to room she began to panic & became tearful. Pt reminded to take slow, deep breaths. [place], PA notified, and RN at bedside with pt.

## 2019-03-04 NOTE — Progress Notes (Signed)
  Echocardiogram 2D Echocardiogram has been performed.  Theresa Crawford 03/04/2019, 12:20 PM

## 2019-03-04 NOTE — ED Notes (Signed)
[facility] confirmed that they are aware patient needs transported to [facility]. RN aware.

## 2019-03-04 NOTE — ED Notes (Signed)
[facility] on the way to [facility].

## 2019-03-04 NOTE — Progress Notes (Signed)
Late entry: patient very tearful at shift change, now she is barely answering to the staff question. Assessment done Pt is laying down in in no apparent distress with eyes closed. We'll continue to monitor.

## 2019-03-04 NOTE — Progress Notes (Signed)
Admitted this morning by Dr Maudie Mercury.  38 year old with history of sarcoidosis, moderate pulmonary hypertension, CHF, mitral valve stenosis status post repair, depression admitted to the hospital with shortness of breath and orthopnea.  Chest x-ray suggestive of pulmonary vascular congestion with slightly elevated BNP.  Call with test was negative.  Patient was admitted with CHF exacerbation.  Transferred to [facility] for evaluation by cardiology  Cardiac enzymes remain negative.  Symptoms slightly improved.  Patient's vital signs are stable at this time.  Some bibasilar crackles.  Chest x-ray reviewed-pulmonary vascular congestion.  Previous echo about a year ago showed normal ejection fraction.  At this time continue diuresis, trend cardiac enzymes.  Repeat echocardiogram ordered.  Cardiology consulted.  Rest of the management as planned earlier by the admitting physician.  Call with further questions as needed.  Gerlean Ren MD [facility]

## 2019-03-04 NOTE — ED Notes (Signed)
Patient is resting comfortably. 

## 2019-03-04 NOTE — H&P (Signed)
TRH H&P    Patient Demographics:    Theresa Crawford, is a [age] y.o. female  MRN: [id number]  DOB - Aug 15, 1981  Admit Date - 03/03/2019  Referring MD/NP/PA:  Burgess AmorJulie Idol  Outpatient Primary MD for the patient is House, Eugenio HoesKaren A, [name]  Patient coming from:  home  Chief complaint- dyspnea   HPI:    Theresa Crawford  is a 38 y.o. female, w anxiety, depression, sarcoidosis, hypertension, moderate pulmonary hypertension, CHF, h/o mitral stenosis s/p repair, now with recurrent mitral valve stenosis and regurgitation apparently presents with sob since Friday nite.  + orthopnea.  Pt states has had cough w white sputum and slight fever (subjective), and slight chest discomfort. Pt states has gained 3lbs. Pt is very emotional and appears to be a poor historian. Difficult to tell what exactly her symptoms are.    Pt didn't take anything for her symptoms. Pt denies palpitations, n/v, abd pain, diarrhea, brbpr.  Pt denies any known covid contact.    In ED,   CXR IMPRESSION: Cardiomegaly with pulmonary vascular congestion and interstitial opacities, favor edema. Small RIGHT pleural effusion.  BNP 528 covid -19 negative  Na 137, K 3.8, Bun 12, Creatinine 0.83 Magnesium 2.2 Wbc 7.4, Hgb 11.0, Plt 344  Pt tx with lasix 60mg  iv x1 in ED.   Pt will be admitted for acute CHF        Review of systems:    In addition to the HPI above,    No Headache, No changes with Vision or hearing, No problems swallowing food or Liquids, No Abdominal pain, No Nausea or Vomiting, bowel movements are regular, No Blood in stool or Urine, No dysuria, No new skin rashes or bruises, No new joints pains-aches,  No new weakness, tingling, numbness in any extremity, No recent weight gain or loss, No polyuria, polydypsia or polyphagia, No significant Mental Stressors.  All other systems reviewed and are negative.    Past History of  the following :    Past Medical History:  Diagnosis Date  . Anxiety   . Arthritis    "lower back" (04/04/2018)  . Chronic bronchitis (HCC)   . Chronic diastolic CHF (congestive heart failure) (HCC)   . Chronic lower back pain   . DDD (degenerative disc disease), lumbar   . Depression   . GERD (gastroesophageal reflux disease)   . Headache    "weekly" (04/04/2018)  . Heart murmur   . Hypertension   . Mitral valve disease   . Normocytic anemia   . Obesity   . Palpitations   . Pericarditis   . Premature atrial contractions   . Pulmonary hypertension (HCC)   . PVC's (premature ventricular contractions)    a. h/o palpitations with event monitor in 03/2017 showing NSR with PACs/PVCs.  . Recurrent mitral valve stenosis and regurgitation s/p mitral valve repair   . S/P mitral valve repair 03/27/2013   [place] valvul[place] including resuspension of entire posterior leaflet using Gore-tex neochords and 30 mm Edwards Physio  ring annuloplasty  . Sarcoidosis   . Tobacco abuse   . Tricuspid regurgitation       Past Surgical History:  Procedure Laterality Date  . ABDOMINAL HERNIA REPAIR  ~ 2011  . ABDOMINAL HYSTERECTOMY  2011   "partial"; PID w/problem with fallopian tubes, had left fallopian and left ovary removed, pt still having periods  . CARDIAC CATHETERIZATION  2014  . CESAREAN SECTION  2004; 2009  . HERNIA REPAIR    . MITRAL VALVE REPAIR  03/27/2013   [place] - complex valvuloplasty including resuspension of posterior leaflet with 30 mm CE Physio ring annuloplasty  . MULTIPLE EXTRACTIONS WITH ALVEOLOPLASTY N/A 04/03/2018   Procedure: Extraction of tooth #30 with alveoloplasty and gross debridement of remaining teeth;  Surgeon: Charlynne PanderKulinski, Ronald F, DDS;  [facility];  Service: Oral Surgery;  Laterality: N/A;  . PARTIAL HYSTERECTOMY  2011   PID w/problem with fallopian tubes, had left fallopian and left ovary  removed, pt still having periods  . TEE WITHOUT CARDIOVERSION N/A 05/26/2016   Procedure: TRANSESOPHAGEAL ECHOCARDIOGRAM (TEE);  Surgeon: Laqueta LindenSuresh A Koneswaran, MD;  Location: [facility];  Service: Cardiovascular;  Laterality: N/A;  . TEE WITHOUT CARDIOVERSION N/A 01/25/2018   Procedure: TRANSESOPHAGEAL ECHOCARDIOGRAM (TEE) WITH PROPOFOL;  Surgeon: Jonelle SidleMcDowell, Samuel G, MD;  Location: [facility];  Service: Cardiovascular;  Laterality: N/A;      Social History:      Social History   Tobacco Use  . Smoking status: Current Some Day Smoker    Packs/day: 0.25    Years: 24.00    Pack years: 6.00    Types: Cigarettes    Start date: 11/03/1999  . Smokeless tobacco: Never Used  Substance Use Topics  . Alcohol use: Yes    Comment: 04/04/2018 "1-2 beers/month; if that"       Family History :     Family History  Problem Relation Age of Onset  . Heart failure Father        just received LVAD  . Heart disease Paternal Grandfather        stent placement       Home Medications:   Prior to Admission medications   Medication Sig Start Date End Date Taking? Authorizing Provider  albuterol (PROVENTIL HFA;VENTOLIN HFA) 108 (90 Base) MCG/ACT inhaler Inhale 2 puffs into the lungs every 6 (six) hours as needed for wheezing or shortness of breath.    [provider]  bumetanide (BUMEX) 1 MG tablet Take 2 tablets (2 mg total) by mouth 2 (two) times daily. 02/28/19   Laqueta LindenKoneswaran, Suresh A, MD  docusate sodium (COLACE) 100 MG capsule Take 100 mg by mouth daily as needed for moderate constipation.     [provider]  EPINEPHrine (EPIPEN 2-PAK) 0.3 mg/0.3 mL IJ SOAJ injection Inject 0.3 Units into the muscle once.     [provider]  ferrous sulfate 325 (65 FE) MG tablet Take 1 tablet (325 mg total) by mouth 2 (two) times daily with a meal. Patient taking differently: Take 325 mg by mouth daily with breakfast.  03/08/18   Philip AspenHernandez Acosta, Limmie PatriciaEstela Y, MD  LORazepam (ATIVAN)  0.5 MG tablet Take 1 tablet (0.5 mg total) by mouth every 6 (six) hours as needed for anxiety or sleep. 04/15/18   Oneta RackLewis, Tanika N, NP  metoprolol succinate (TOPROL-XL) 25 MG 24 hr tablet Take 1.5 tablets (37.5 mg total) by mouth daily. 02/28/19   Laqueta LindenKoneswaran, Suresh A, MD  potassium  chloride SA (K-DUR) 20 MEQ tablet Take 2 tablets (40 mEq total) by mouth daily. Take 2 tablets daily 02/28/19   Laqueta LindenKoneswaran, Suresh A, MD  Probiotic Product (PROBIOTIC PO) Take 1 capsule by mouth daily.    [provider]  QUEtiapine (SEROQUEL) 50 MG tablet Take 1 tablet (50 mg total) by mouth at bedtime. 04/15/18   Oneta RackLewis, Tanika N, NP  sertraline (ZOLOFT) 100 MG tablet Take 1 tablet (100 mg total) by mouth daily. 04/16/18   Oneta RackLewis, Tanika N, NP  traZODone (DESYREL) 50 MG tablet Take 0.5 tablets (25 mg total) by mouth at bedtime. Patient taking differently: Take 25-50 mg by mouth at bedtime.  04/15/18   Oneta RackLewis, Tanika N, NP     Allergies:     Allergies  Allergen Reactions  . Bee Venom Anaphylaxis  . Lisinopril Shortness Of Breath, Swelling and Other (See Comments)    Throat swelling  . Penicillins Shortness Of Breath, Swelling and Other (See Comments)    Has patient had a PCN reaction causing immediate rash, facial/tongue/throat swelling, SOB or lightheadedness with hypotension: Yes Has patient had a PCN reaction causing severe rash involving mucus membranes or skin necrosis: Yes Has patient had a PCN reaction that required hospitalization No Has patient had a PCN reaction occurring within the last 10 years: No If all of the above answers are "NO", then may proceed with Cephalosporin use.   Marland Kitchen. Perflutren Lipid Microsphere Shortness Of Breath and Other (See Comments)    Other reaction(s): Chest Pain/Tightness  . Shellfish Allergy Anaphylaxis  . Contrast Media [Iodinated Diagnostic Agents] Hives, Itching and Other (See Comments)    Itching and hives to throat, neck, face and arms.   No difficulty breathing.   This  was [facility]. Contrast Dye     Physical Exam:   Vitals  Blood pressure 124/70, pulse 91, temperature 98.1 F (36.7 C), temperature source Oral, resp. rate (!) 23, height 5\' 4"  (1.626 m), weight 100.2 kg, last menstrual period 02/18/2019, SpO2 91 %.  1.  General: axox3  2. Psychiatric: anxious  3. Neurologic: cn2-12 intact, reflexes 2+ symmetric, diffuse with no clonus, motor 5/5 in all 4 ext  4. HEENMT:  Anicteric, pupils 1.415mm symmetric, direct, consensual near intact Mucous membranes moist Neck: slight jvd  5. Respiratory : Slight decrease in bs at right lung base,  Slight crackles bilateral base, no wheeze  6. Cardiovascular : rrr s1, s2, 1/6 dm apex  7. Gastrointestinal:  Abd: soft, obese, nt, nd, +bs  8. Skin:  Ext: no c/c/e,  No rash  9.Musculoskeletal:  Good ROM  No adenopathy    Data Review:    CBC Recent Labs  Lab 03/03/19 1914  WBC 7.4  HGB 11.0*  HCT 37.2  PLT 344  MCV 80.5  MCH 23.8*  MCHC 29.6*  RDW 21.3*   ------------------------------------------------------------------------------------------------------------------  Results for orders placed or performed during the hospital encounter of 03/03/19 (from the past 48 hour(s))  Basic metabolic panel     Status: Abnormal   Collection Time: 03/03/19  7:14 PM  Result Value Ref Range   Sodium 137 135 - 145 mmol/L   Potassium 3.8 3.5 - 5.1 mmol/L   Chloride 108 98 - 111 mmol/L   CO2 22 22 - 32 mmol/L   Glucose, Bld 102 (H) 70 - 99 mg/dL   BUN 12 6 - 20 mg/dL   Creatinine, Ser 1.610.83 0.44 - 1.00 mg/dL   Calcium 8.7 (L)  8.9 - 10.3 mg/dL   GFR calc non Af Amer >60 >60 mL/min   GFR calc Af Amer >60 >60 mL/min   Anion gap 7 5 - 15    Comment: Performed at [facility], [address], [place], [place] [address]  Magnesium     Status: None   Collection Time: 03/03/19  7:14 PM  Result Value Ref Range   Magnesium 2.2 1.7 - 2.4 mg/dL    Comment:  Performed at [facility], [address], [place], [place] [address]  Brain natriuretic peptide (order ONLY if patient c/o SOB)     Status: Abnormal   Collection Time: 03/03/19  7:14 PM  Result Value Ref Range   B Natriuretic Peptide 528.0 (H) 0.0 - 100.0 pg/mL    Comment: Performed at [facility], [address] [place], [place] [address]  CBC     Status: Abnormal   Collection Time: 03/03/19  7:14 PM  Result Value Ref Range   WBC 7.4 4.0 - 10.5 K/uL   RBC 4.62 3.87 - 5.11 MIL/uL   Hemoglobin 11.0 (L) 12.0 - 15.0 g/dL   HCT 91.4 78.2 - 95.6 %   MCV 80.5 80.0 - 100.0 fL   MCH 23.8 (L) 26.0 - 34.0 pg   MCHC 29.6 (L) 30.0 - 36.0 g/dL   RDW 21.3 (H) 08.6 - 57.8 %   Platelets 344 150 - 400 K/uL   nRBC 0.0 0.0 - 0.2 %    Comment: Performed at [facility], [address], [place], [place] [address]  SARS Coronavirus 2 (CEPHEID- Performed in [facility] hospital lab), Hosp Order     Status: None   Collection Time: 03/03/19  7:14 PM   Specimen: Nasopharyngeal Swab  Result Value Ref Range   SARS Coronavirus 2 NEGATIVE NEGATIVE    Comment: (NOTE) If result is NEGATIVE SARS-CoV-2 target nucleic acids are NOT DETECTED. The SARS-CoV-2 RNA is generally detectable in upper and lower  respiratory specimens during the acute phase of infection. The lowest  concentration of SARS-CoV-2 viral copies this assay can detect is 250  copies / mL. A negative result does not preclude SARS-CoV-2 infection  and should not be used as the sole basis for treatment or other  patient management decisions.  A negative result may occur with  improper specimen collection / handling, submission of specimen other  than nasopharyngeal swab, presence of viral mutation(s) within the  areas targeted by this assay, and inadequate number of viral copies  (<250 copies / mL). A negative result must be combined with clinical  observations, patient history, and epidemiological information. If result is POSITIVE SARS-CoV-2  target nucleic acids are DETECTED. The SARS-CoV-2 RNA is generally detectable in upper and lower  respiratory specimens dur ing the acute phase of infection.  Positive  results are indicative of active infection with SARS-CoV-2.  Clinical  correlation with patient history and other diagnostic information is  necessary to determine patient infection status.  Positive results do  not rule out bacterial infection or co-infection with other viruses. If result is PRESUMPTIVE POSTIVE SARS-CoV-2 nucleic acids MAY BE PRESENT.   A presumptive positive result was obtained on the submitted specimen  and confirmed on repeat testing.  While 2019 novel coronavirus  (SARS-CoV-2) nucleic acids may be present in the submitted sample  additional confirmatory testing may be necessary for epidemiological  and / or clinical management purposes  to differentiate between  SARS-CoV-2 and other Sarbecovirus currently known to infect humans.  If clinically indicated additional testing with an alternate test  methodology [phone]) is advised. The SARS-CoV-2 RNA is generally  detectable in upper and lower respiratory sp ecimens during the acute  phase of infection. The expected result is Negative. Fact Sheet for Patients:  StrictlyIdeas.no Fact Sheet for Healthcare Providers: BankingDealers.co.za This test is not yet approved or cleared by the [place] FDA and has been authorized for detection and/or diagnosis of SARS-CoV-2 by FDA under an Emergency Use Authorization (EUA).  This EUA will remain in effect (meaning this test can be used) for the duration of the COVID-19 declaration under Section 564(b)(1) of the Act, 21 U.S.C. section 360bbb-3(b)(1), unless the authorization is terminated or revoked sooner. Performed at [facility], [address] [place], [place] [address]     Chemistries  Recent Labs  Lab 03/03/19 1914  NA 137  K 3.8  CL 108  CO2 22   GLUCOSE 102*  BUN 12  CREATININE 0.83  CALCIUM 8.7*  MG 2.2   ------------------------------------------------------------------------------------------------------------------  ------------------------------------------------------------------------------------------------------------------ GFR: Estimated Creatinine Clearance: 104.7 mL/min (by C-G formula based on SCr of 0.83 mg/dL). Liver Function Tests: No results for input(s): AST, ALT, ALKPHOS, BILITOT, PROT, ALBUMIN in the last 168 hours. No results for input(s): LIPASE, AMYLASE in the last 168 hours. No results for input(s): AMMONIA in the last 168 hours. Coagulation Profile: No results for input(s): INR, PROTIME in the last 168 hours. Cardiac Enzymes: No results for input(s): CKTOTAL, CKMB, CKMBINDEX, TROPONINI in the last 168 hours. BNP (last 3 results) No results for input(s): PROBNP in the last 8760 hours. HbA1C: No results for input(s): HGBA1C in the last 72 hours. CBG: No results for input(s): GLUCAP in the last 168 hours. Lipid Profile: No results for input(s): CHOL, HDL, LDLCALC, TRIG, CHOLHDL, LDLDIRECT in the last 72 hours. Thyroid Function Tests: No results for input(s): TSH, T4TOTAL, FREET4, T3FREE, THYROIDAB in the last 72 hours. Anemia Panel: No results for input(s): VITAMINB12, FOLATE, FERRITIN, TIBC, IRON, RETICCTPCT in the last 72 hours.  --------------------------------------------------------------------------------------------------------------- Urine analysis:    Component Value Date/Time   COLORURINE YELLOW 07/09/2018 1723   APPEARANCEUR CLEAR 07/09/2018 1723   LABSPEC 1.024 07/09/2018 1723   PHURINE 6.0 07/09/2018 1723   GLUCOSEU NEGATIVE 07/09/2018 1723   HGBUR LARGE (A) 07/09/2018 1723   BILIRUBINUR NEGATIVE 07/09/2018 1723   KETONESUR NEGATIVE 07/09/2018 1723   PROTEINUR 30 (A) 07/09/2018 1723   NITRITE NEGATIVE 07/09/2018 1723   LEUKOCYTESUR SMALL (A) 07/09/2018 1723      Imaging  Results:    Dg Chest Portable 1 View  Result Date: 03/03/2019 CLINICAL DATA:  Acute shortness of breath and cough for 1 day. EXAM: PORTABLE CHEST 1 VIEW COMPARISON:  11/06/2018 chest CT, chest radiograph and prior studies. FINDINGS: Cardiomegaly and cardiac valve replacement again noted. Pulmonary vascular congestion and interstitial opacities are noted. A small RIGHT pleural effusion is present. No pneumothorax or acute bony abnormality. IMPRESSION: Cardiomegaly with pulmonary vascular congestion and interstitial opacities, favor edema. Small RIGHT pleural effusion. Electronically Signed   By: Margarette Canada M.D.   On: 03/03/2019 20:48   Ekg: nsr at 100, nl axis, early R progression T inversion in avl, v1,2     Assessment & Plan:    Principal Problem:   Acute CHF (congestive heart failure) (HCC) Active Problems:   Hypertension   Recurrent mitral valve stenosis and regurgitation s/p mitral valve repair   Pulmonary hypertension (HCC)  Acute chf (diastolic) Tele Trop I U9N x3, TSH Check cardiac  echo Cont Toprol XL 37.5mg  po qday Cont Kcl po qday DC Bumex Start Lasix  iv bid Sent [employer] email for cardiology consult, not sure if works on weekends.  Please consult cardiology in AM  H/o mitral stenosis/ regurgitation Check cardiac echo   Anxiety/ Depression Cont Zoloft  po qday Cont Trazodone  po qhs Cont Seroquel  po qday Cont Ativan   Anemia Cont ferrous sulfate Check cbc in am  Asthma Cont Ventolin HFA  DVT Prophylaxis-   Lovenox - SCDs   AM Labs Ordered, also please review Full Orders  Family Communication: Admission, patients condition and plan of care including tests being ordered have been discussed with the patient  who indicate understanding and agree with the plan and Code Status.  Code Status:  FULL CODE,  Spoke with her mother to let her know that we will transfer her to [facility] for evaluation since no cardiology service at [facility] on  weekends  Admission status: Observation: Based on patients clinical presentation and evaluation of above clinical data, I have made determination that patient meets observation criteria at this time.  Time spent in minutes : 70   Pearson Grippe M.D on 03/04/2019 at 2:09 AM

## 2019-03-05 ENCOUNTER — Inpatient Hospital Stay (HOSPITAL_COMMUNITY): Payer: Self-pay

## 2019-03-05 ENCOUNTER — Inpatient Hospital Stay (HOSPITAL_COMMUNITY): Payer: Self-pay | Admitting: Certified Registered Nurse Anesthetist

## 2019-03-05 ENCOUNTER — Encounter (HOSPITAL_COMMUNITY)
Admission: EM | Disposition: A | Discharge status: Home / Self Care | Payer: Self-pay | Source: Home / Self Care | Attending: Student

## 2019-03-05 DIAGNOSIS — F419 Anxiety disorder, unspecified: Secondary | ICD-10-CM

## 2019-03-05 DIAGNOSIS — I361 Nonrheumatic tricuspid (valve) insufficiency: Secondary | ICD-10-CM

## 2019-03-05 DIAGNOSIS — F329 Major depressive disorder, single episode, unspecified: Secondary | ICD-10-CM

## 2019-03-05 HISTORY — PX: RIGHT/LEFT HEART CATH AND CORONARY ANGIOGRAPHY: CATH118266

## 2019-03-05 HISTORY — PX: TEE WITHOUT CARDIOVERSION: SHX5443

## 2019-03-05 LAB — BASIC METABOLIC PANEL
Anion gap: 8 (ref 5–15)
BUN: 16 mg/dL (ref 6–20)
CO2: 22 mmol/L (ref 22–32)
Calcium: 8.8 mg/dL — ABNORMAL LOW (ref 8.9–10.3)
Chloride: 108 mmol/L (ref 98–111)
Creatinine, Ser: 1.04 mg/dL — ABNORMAL HIGH (ref 0.44–1.00)
GFR calc Af Amer: >60 mL/min (ref >60)
GFR calc non Af Amer: >60 mL/min (ref >60)
Glucose, Bld: 91 mg/dL (ref 70–99)
Potassium: 4.2 mmol/L (ref 3.5–5.1)
Sodium: 138 mmol/L (ref 135–145)

## 2019-03-05 LAB — POCT I-STAT EG7
Acid-base deficit: 3 mmol/L — ABNORMAL HIGH (ref 0.0–2.0)
Bicarbonate: 22.1 mmol/L (ref 20.0–28.0)
Calcium, Ion: 1.17 mmol/L (ref 1.15–1.40)
HCT: 42 % (ref 36.0–46.0)
Hemoglobin: 14.3 g/dL (ref 12.0–15.0)
O2 Saturation: 69 %
Potassium: 4.6 mmol/L (ref 3.5–5.1)
Sodium: 143 mmol/L (ref 135–145)
TCO2: 23 mmol/L (ref 22–32)
pCO2, Ven: 40.3 mmHg — ABNORMAL LOW (ref 44.0–60.0)
pH, Ven: 7.348 (ref 7.250–7.430)
pO2, Ven: 38 mmHg (ref 32.0–45.0)

## 2019-03-05 LAB — POCT I-STAT 7, (LYTES, BLD GAS, ICA,H+H)
Acid-base deficit: 5 mmol/L — ABNORMAL HIGH (ref 0.0–2.0)
Bicarbonate: 19.2 mmol/L — ABNORMAL LOW (ref 20.0–28.0)
Calcium, Ion: 1.11 mmol/L — ABNORMAL LOW (ref 1.15–1.40)
HCT: 39 % (ref 36.0–46.0)
Hemoglobin: 13.3 g/dL (ref 12.0–15.0)
O2 Saturation: 97 %
Potassium: 4.5 mmol/L (ref 3.5–5.1)
Sodium: 143 mmol/L (ref 135–145)
TCO2: 20 mmol/L — ABNORMAL LOW (ref 22–32)
pCO2 arterial: 32.6 mmHg (ref 32.0–48.0)
pH, Arterial: 7.377 (ref 7.350–7.450)
pO2, Arterial: 93 mmHg (ref 83.0–108.0)

## 2019-03-05 LAB — CBC
HCT: 37.1 % (ref 36.0–46.0)
Hemoglobin: 11.4 g/dL — ABNORMAL LOW (ref 12.0–15.0)
MCH: 24.2 pg — ABNORMAL LOW (ref 26.0–34.0)
MCHC: 30.7 g/dL (ref 30.0–36.0)
MCV: 78.8 fL — ABNORMAL LOW (ref 80.0–100.0)
Platelets: 386 10*3/uL (ref 150–400)
RBC: 4.71 MIL/uL (ref 3.87–5.11)
RDW: 20.7 % — ABNORMAL HIGH (ref 11.5–15.5)
WBC: 6.8 10*3/uL (ref 4.0–10.5)
nRBC: 0 % (ref 0.0–0.2)

## 2019-03-05 LAB — MAGNESIUM: Magnesium: 2.1 mg/dL (ref 1.7–2.4)

## 2019-03-05 SURGERY — ECHOCARDIOGRAM, TRANSESOPHAGEAL
Anesthesia: Monitor Anesthesia Care

## 2019-03-05 SURGERY — RIGHT/LEFT HEART CATH AND CORONARY ANGIOGRAPHY
Anesthesia: LOCAL

## 2019-03-05 MED ORDER — SODIUM CHLORIDE 0.9% FLUSH: 3.0000 mL | Freq: Two times a day (BID) | INTRAVENOUS | Status: DC

## 2019-03-05 MED ORDER — POTASSIUM CHLORIDE CRYS ER 20 MEQ PO TBCR
40.0000 meq | EXTENDED_RELEASE_TABLET | Freq: Once | ORAL | Status: AC
  Administered 2019-03-05: 40 meq via ORAL

## 2019-03-05 MED ORDER — SODIUM CHLORIDE 0.9 % IV SOLN: INTRAVENOUS | Status: DC

## 2019-03-05 MED ORDER — MIDAZOLAM HCL 2 MG/2ML IJ SOLN
INTRAMUSCULAR | Status: AC
  Filled 2019-03-05: qty 2

## 2019-03-05 MED ORDER — IOHEXOL 350 MG/ML SOLN
INTRAVENOUS | Status: DC | PRN
  Administered 2019-03-05: 17:00:00 55 mL via INTRACARDIAC

## 2019-03-05 MED ORDER — DIPHENHYDRAMINE HCL 50 MG/ML IJ SOLN
INTRAMUSCULAR | Status: DC | PRN
  Administered 2019-03-05: 50 mg via INTRAVENOUS

## 2019-03-05 MED ORDER — METHYLPREDNISOLONE SODIUM SUCC 125 MG IJ SOLR
125.0000 mg | Freq: Once | INTRAMUSCULAR | Status: AC
  Administered 2019-03-05: 125 mg via INTRAVENOUS
  Filled 2019-03-05: qty 2

## 2019-03-05 MED ORDER — CYCLOBENZAPRINE HCL 10 MG PO TABS
5.0000 mg | ORAL_TABLET | Freq: Once | ORAL | Status: AC
  Administered 2019-03-05: 06:00:00 5 mg via ORAL
  Filled 2019-03-05: qty 1

## 2019-03-05 MED ORDER — HEPARIN SODIUM (PORCINE) 1000 UNIT/ML IJ SOLN
INTRAMUSCULAR | Status: DC | PRN
  Administered 2019-03-05: 5000 [IU] via INTRAVENOUS

## 2019-03-05 MED ORDER — FENTANYL CITRATE (PF) 100 MCG/2ML IJ SOLN
INTRAMUSCULAR | Status: AC
  Filled 2019-03-05: qty 2

## 2019-03-05 MED ORDER — HEPARIN (PORCINE) IN NACL 1000-0.9 UT/500ML-% IV SOLN
INTRAVENOUS | Status: AC
  Filled 2019-03-05: qty 1000

## 2019-03-05 MED ORDER — HYDRALAZINE HCL 20 MG/ML IJ SOLN: 10.0000 mg | INTRAMUSCULAR | Status: AC | PRN

## 2019-03-05 MED ORDER — LABETALOL HCL 5 MG/ML IV SOLN: 10.0000 mg | INTRAVENOUS | Status: AC | PRN

## 2019-03-05 MED ORDER — MIDAZOLAM HCL 2 MG/2ML IJ SOLN
INTRAMUSCULAR | Status: DC | PRN
  Administered 2019-03-05 (×2): 1 mg via INTRAVENOUS

## 2019-03-05 MED ORDER — LIDOCAINE 2% (20 MG/ML) 5 ML SYRINGE
INTRAMUSCULAR | Status: DC | PRN
  Administered 2019-03-05: 40 mg via INTRAVENOUS

## 2019-03-05 MED ORDER — PROPOFOL 500 MG/50ML IV EMUL
INTRAVENOUS | Status: DC | PRN
  Administered 2019-03-05: 100 ug/kg/min via INTRAVENOUS

## 2019-03-05 MED ORDER — HEPARIN (PORCINE) IN NACL 1000-0.9 UT/500ML-% IV SOLN
INTRAVENOUS | Status: DC | PRN
  Administered 2019-03-05 (×2): 500 mL

## 2019-03-05 MED ORDER — FENTANYL CITRATE (PF) 100 MCG/2ML IJ SOLN
INTRAMUSCULAR | Status: DC | PRN
  Administered 2019-03-05 (×2): 25 ug via INTRAVENOUS

## 2019-03-05 MED ORDER — DIPHENHYDRAMINE HCL 50 MG/ML IJ SOLN: 50.0000 mg | Freq: Once | INTRAMUSCULAR | Status: DC

## 2019-03-05 MED ORDER — LIDOCAINE HCL (PF) 1 % IJ SOLN
INTRAMUSCULAR | Status: AC
  Filled 2019-03-05: qty 30

## 2019-03-05 MED ORDER — DIPHENHYDRAMINE HCL 25 MG PO CAPS: 50.0000 mg | ORAL_CAPSULE | Freq: Once | ORAL | Status: DC

## 2019-03-05 MED ORDER — HEPARIN SODIUM (PORCINE) 1000 UNIT/ML IJ SOLN
INTRAMUSCULAR | Status: AC
  Filled 2019-03-05: qty 1

## 2019-03-05 MED ORDER — PHENYLEPHRINE 40 MCG/ML (10ML) SYRINGE FOR IV PUSH (FOR BLOOD PRESSURE SUPPORT)
PREFILLED_SYRINGE | INTRAVENOUS | Status: DC | PRN
  Administered 2019-03-05: 120 ug via INTRAVENOUS

## 2019-03-05 MED ORDER — DIPHENHYDRAMINE HCL 50 MG/ML IJ SOLN
INTRAMUSCULAR | Status: AC
  Filled 2019-03-05: qty 1

## 2019-03-05 MED ORDER — SODIUM CHLORIDE 0.9 % IV SOLN: INTRAVENOUS | Status: AC

## 2019-03-05 MED ORDER — METHYLPREDNISOLONE SODIUM SUCC 40 MG IJ SOLR: 40.0000 mg | INTRAMUSCULAR | Status: DC

## 2019-03-05 MED ORDER — VERAPAMIL HCL 2.5 MG/ML IV SOLN
INTRAVENOUS | Status: AC
  Filled 2019-03-05: qty 2

## 2019-03-05 MED ORDER — PROPOFOL 10 MG/ML IV BOLUS
INTRAVENOUS | Status: DC | PRN
  Administered 2019-03-05: 30 mg via INTRAVENOUS

## 2019-03-05 MED ORDER — SODIUM CHLORIDE 0.9% FLUSH
3.0000 mL | Freq: Two times a day (BID) | INTRAVENOUS | Status: DC
  Administered 2019-03-05 – 2019-03-07 (×5): 3 mL via INTRAVENOUS

## 2019-03-05 MED ORDER — SODIUM CHLORIDE 0.9 % IV SOLN
250.0000 mL | INTRAVENOUS | Status: DC | PRN
  Administered 2019-03-05: 500 mL via INTRAVENOUS
  Administered 2019-03-05: 14:00:00 via INTRAVENOUS

## 2019-03-05 MED ORDER — LIDOCAINE HCL (PF) 1 % IJ SOLN
INTRAMUSCULAR | Status: DC | PRN
  Administered 2019-03-05: 2 mL
  Administered 2019-03-05: 10 mL
  Administered 2019-03-05: 2 mL

## 2019-03-05 MED ORDER — SODIUM CHLORIDE 0.9 % IV SOLN: 250.0000 mL | INTRAVENOUS | Status: DC | PRN

## 2019-03-05 MED ORDER — SODIUM CHLORIDE 0.9% FLUSH: 3.0000 mL | INTRAVENOUS | Status: DC | PRN

## 2019-03-05 MED ORDER — VERAPAMIL HCL 2.5 MG/ML IV SOLN
INTRAVENOUS | Status: DC | PRN
  Administered 2019-03-05: 16:00:00 10 mL via INTRA_ARTERIAL

## 2019-03-05 SURGICAL SUPPLY — 16 items
CATH 5FR JL3.5 JR4 ANG PIG MP (CATHETERS) ×1 IMPLANT
CATH BALLN WEDGE 5F 110CM (CATHETERS) ×1 IMPLANT
CATH INFINITI 5FR MPB2 (CATHETERS) ×1 IMPLANT
DEVICE RAD COMP TR BAND LRG (VASCULAR PRODUCTS) ×1 IMPLANT
GLIDESHEATH SLEND SS 6F .021 (SHEATH) ×1 IMPLANT
GUIDEWIRE INQWIRE 1.5J.035X260 (WIRE) IMPLANT
INQWIRE 1.5J .035X260CM (WIRE) ×2
KIT HEART LEFT (KITS) ×2 IMPLANT
KIT MICROPUNCTURE NIT STIFF (SHEATH) ×1 IMPLANT
PACK CARDIAC CATHETERIZATION (CUSTOM PROCEDURE TRAY) ×2 IMPLANT
SHEATH GLIDE SLENDER 4/5FR (SHEATH) ×1 IMPLANT
SHEATH PINNACLE 5F 10CM (SHEATH) ×1 IMPLANT
SHEATH PROBE COVER 6X72 (BAG) ×1 IMPLANT
TRANSDUCER W/STOPCOCK (MISCELLANEOUS) ×2 IMPLANT
TUBING CIL FLEX 10 FLL-RA (TUBING) ×2 IMPLANT
WIRE EMERALD 3MM-J .035X150CM (WIRE) ×1 IMPLANT

## 2019-03-05 NOTE — Progress Notes (Addendum)
Progress Note  Patient Name: Theresa DaneLatasha Y Stockham Date of Encounter: 03/05/2019  Primary Cardiologist: Prentice DockerSuresh Koneswaran, MD   Subjective   Breathing improving. No chest pain.   Inpatient Medications    Scheduled Meds: . enoxaparin (LOVENOX) injection  0.5 mg/kg Subcutaneous Q24H  . furosemide  40 mg Intravenous BID  . metoprolol succinate  37.5 mg Oral Daily  . potassium chloride SA  40 mEq Oral Daily  . QUEtiapine  50 mg Oral QHS  . saccharomyces boulardii  250 mg Oral Daily  . sertraline  100 mg Oral Daily  . traZODone  25-50 mg Oral QHS   Continuous Infusions:  PRN Meds: albuterol, docusate sodium, LORazepam   Vital Signs    Vitals:   03/04/19 1826 03/04/19 2050 03/04/19 2110 03/05/19 0506  BP: 115/61 109/79 109/79 (!) 114/58  Pulse: 82 88 94 71  Resp:  (!) 25 (!) 28 12  Temp:  98.5 F (36.9 C) 98.3 F (36.8 C) 98.2 F (36.8 C)  TempSrc:  Oral Oral Oral  SpO2:  95% 94% 95%  Weight:    96.9 kg  Height:    5\' 4"  (1.626 m)    Intake/Output Summary (Last 24 hours) at 03/05/2019 0818 Last data filed at 03/04/2019 1900 Gross per 24 hour  Intake 720 ml  Output -  Net 720 ml   Last 3 Weights 03/05/2019 03/03/2019 02/28/2019  Weight (lbs) 213 lb 9.6 oz 221 lb 219 lb  Weight (kg) 96.888 kg 100.245 kg 99.338 kg  Some encounter information is confidential and restricted. Go to Review Flowsheets activity to see all data.      Telemetry    SR at 70s - Personally Reviewed  ECG    N/A  Physical Exam   GEN: No acute distress.   Neck: No JVD Cardiac: RRR, + murmurs, rubs, or gallops.  Respiratory: Clear to auscultation bilaterally. GI: Soft, nontender, non-distended  MS: No edema; No deformity. Neuro:  Nonfocal  Psych: Normal affect   Labs    Chemistry Recent Labs  Lab 03/03/19 1914 03/04/19 0728  NA 137 134*  K 3.8 3.3*  CL 108 104  CO2 22 24  GLUCOSE 102* 82  BUN 12 12  CREATININE 0.83 1.04*  CALCIUM 8.7* 8.4*  PROT  --  7.3  ALBUMIN  --  3.5   AST  --  18  ALT  --  11  ALKPHOS  --  61  BILITOT  --  0.7  GFRNONAA >60 >60  GFRAA >60 >60  ANIONGAP 7 6     Hematology Recent Labs  Lab 03/03/19 1914 03/04/19 0728  WBC 7.4 6.7  RBC 4.62 4.37  HGB 11.0* 10.8*  HCT 37.2 36.2  MCV 80.5 82.8  MCH 23.8* 24.7*  MCHC 29.6* 29.8*  RDW 21.3* 20.9*  PLT 344 338    Cardiac Enzymes Recent Labs  Lab 03/04/19 0229 03/04/19 0728  TROPONINI <0.03 <0.03   No results for input(s): TROPIPOC in the last 168 hours.   BNP Recent Labs  Lab 03/03/19 1914  BNP 528.0*     DDimer No results for input(s): DDIMER in the last 168 hours.   Radiology    Dg Chest Portable 1 View  Result Date: 03/03/2019 CLINICAL DATA:  Acute shortness of breath and cough for 1 day. EXAM: PORTABLE CHEST 1 VIEW COMPARISON:  11/06/2018 chest CT, chest radiograph and prior studies. FINDINGS: Cardiomegaly and cardiac valve replacement again noted. Pulmonary vascular congestion and interstitial opacities are  noted. A small RIGHT pleural effusion is present. No pneumothorax or acute bony abnormality. IMPRESSION: Cardiomegaly with pulmonary vascular congestion and interstitial opacities, favor edema. Small RIGHT pleural effusion. Electronically Signed   By: Margarette Canada M.D.   On: 03/03/2019 20:48    Cardiac Studies   ECHO 03/04/19: 1. The left ventricle has normal systolic function with an ejection fraction of 60-65%. The cavity size was normal. Left ventricular diastolic Doppler parameters are consistent with pseudonormalization. There is right ventricular volume and pressure  overload. No evidence of left ventricular regional wall motion abnormalities. 2. The right ventricle has normal systolic function. The cavity was moderately enlarged. There is no increase in right ventricular wall thickness. Right ventricular systolic pressure is severely elevated with an estimated pressure of 88.1 mmHg. 3. Left atrial size was moderately dilated. 4. The mitral valve is  myxomatous. Moderate thickening of the mitral valve leaflet. Severe mitral valve stenosis. 5. Post mitral valve repair, annuloplasty ring. Post repair mitral stenosis (mean gradient 62mmHg - severe). 6. Tricuspid valve regurgitation is moderate. 7. The aortic valve is tricuspid. Aortic valve regurgitation was not assessed by color flow Doppler. 8. The inferior vena cava was dilated in size with <50% respiratory variability.  Patient Profile     Theresa Crawford is a 38 y.o. female with a hx of mitral valve repair in [place], annuloplasty ring with subsequent severe mitral stenosis who is being seen for the evaluation of diastolic heart failure.   Valve surgery was postponed in 2019 due to depression and suicidal ideation.  On 02/28/2019 when she spoke to [facility], [place] she was ready to reconsider valve surgery.   Assessment & Plan    1. Acute diastolic CHF in the setting of post mitral valve repair severe mitral stenosis and subsequent likely partially secondary pulmonary hypertension - Echo yesterday as above. Plan for R & L cath today with possible TEE.  - Dr. Roxy Manns to review study.  - BNP 528. Chest x-ray suggestive of pulmonary vascular congestion. -  Net I & O +720cc. Weight down 8 lb.  - Continue IV diuresis with supplemental potassium.   2. Depression/Anxiety    For questions or updates, please contact [place] Please consult [email or web address] for contact info under        Signed, Leanor Kail, PA  03/05/2019, 8:18 AM    Personally seen and examined. Agree with above.  Mitral valve repair in [place] with subsequent mitral stenosis 13 mmHg gradient, severe pulmonary hypertension likely secondary to stenosis.  GEN: Well nourished, well developed, in no acute distress  HEENT: normal  Neck: no JVD, carotid bruits, or masses Cardiac: RRR; soft diastolic and systolic murmur, no rubs, or gallops,no edema  Respiratory:  clear to auscultation bilaterally,  normal work of breathing GI: soft, nontender, nondistended, + BS MS: no deformity or atrophy  Skin: warm and dry, no rash Neuro:  Alert and Oriented x 3, Strength and sensation are intact Psych: euthymic mood, full affect  Mitral valve stenosis following mitral valve repair with annuloplasty ring in [place] Depression anxiety Acute diastolic heart failure  -Continuing with diuresis -Await TEE, right and left heart cath.  Questions answered. - Following these procedures, we will notify Dr. Roxy Manns - Already had dental work in preparation for valve surgery back in July 2019.  Unfortunately, she suffered from severe depression and was hospitalized shortly thereafter and did not undergo surgery.  She is now back and willing to proceed.  Understands the need for possible  mechanical mitral valve and long-term Coumadin usage.  Lyndsey Demos, MD  

## 2019-03-05 NOTE — Progress Notes (Signed)
  Echocardiogram 2D Echocardiogram has been performed.  Theresa Crawford 03/05/2019, 2:50 PM

## 2019-03-05 NOTE — CV Procedure (Signed)
Full report to follow in CV section of chart 

## 2019-03-05 NOTE — Anesthesia Postprocedure Evaluation (Signed)
Anesthesia Post Note  Patient: Theresa Crawford  Procedure(s) Performed: TRANSESOPHAGEAL ECHOCARDIOGRAM (TEE) (N/A )     Patient location during evaluation: PACU Anesthesia Type: MAC Level of consciousness: awake and alert Pain management: pain level controlled Vital Signs Assessment: post-procedure vital signs reviewed and stable Respiratory status: spontaneous breathing and respiratory function stable Cardiovascular status: stable Postop Assessment: no apparent nausea or vomiting Anesthetic complications: no    Last Vitals:  Vitals:   03/05/19 1435 03/05/19 1445  BP: (!) 84/52 (!) 102/43  Pulse: 74 78  Resp: 20 18  Temp: (!) 36.4 C   SpO2: 93% 93%    Last Pain:  Vitals:   03/05/19 1445  TempSrc:   PainSc: 0-No pain                 Nate Perri DANIEL

## 2019-03-05 NOTE — Addendum Note (Signed)
Addendum  created 03/05/19 2138 by Duane Boston, MD   Intraprocedure Event edited, Intraprocedure Staff edited

## 2019-03-05 NOTE — Transfer of Care (Signed)
Immediate Anesthesia Transfer of Care Note  Patient: Theresa Crawford  Procedure(s) Performed: TRANSESOPHAGEAL ECHOCARDIOGRAM (TEE) (N/A )  Patient Location: PACU, ENDO unit  Anesthesia Type:MAC  Level of Consciousness: awake, alert, oriented  Airway & Oxygen Therapy: Patient Spontanous Breathing and Patient connected to nasal cannula oxygen  Post-op Assessment: Report given to RN and Post -op Vital signs reviewed and stable  Post vital signs: Reviewed and stable  Last Vitals:  Vitals Value Taken Time  BP 84/52 03/05/19 1434  Temp    Pulse 74 03/05/19 1434  Resp 16 03/05/19 1434  SpO2 93 % 03/05/19 1434  Vitals shown include unvalidated device data.  Last Pain:  Vitals:   03/05/19 1403  TempSrc: Oral  PainSc:          Complications: No apparent anesthesia complications

## 2019-03-05 NOTE — Telephone Encounter (Signed)
The patient was hospitalized 6/20. TEE and L/RHC were completed today.

## 2019-03-05 NOTE — Plan of Care (Signed)
POC initiated and progressing. 

## 2019-03-05 NOTE — H&P (View-Only) (Signed)
Progress Note  Patient Name: Theresa Crawford Date of Encounter: 03/05/2019  Primary Cardiologist: Prentice DockerSuresh Koneswaran, MD   Subjective   Breathing improving. No chest pain.   Inpatient Medications    Scheduled Meds: . enoxaparin (LOVENOX) injection  0.5 mg/kg Subcutaneous Q24H  . furosemide  40 mg Intravenous BID  . metoprolol succinate  37.5 mg Oral Daily  . potassium chloride SA  40 mEq Oral Daily  . QUEtiapine  50 mg Oral QHS  . saccharomyces boulardii  250 mg Oral Daily  . sertraline  100 mg Oral Daily  . traZODone  25-50 mg Oral QHS   Continuous Infusions:  PRN Meds: albuterol, docusate sodium, LORazepam   Vital Signs    Vitals:   03/04/19 1826 03/04/19 2050 03/04/19 2110 03/05/19 0506  BP: 115/61 109/79 109/79 (!) 114/58  Pulse: 82 88 94 71  Resp:  (!) 25 (!) 28 12  Temp:  98.5 F (36.9 C) 98.3 F (36.8 C) 98.2 F (36.8 C)  TempSrc:  Oral Oral Oral  SpO2:  95% 94% 95%  Weight:    96.9 kg  Height:    5\' 4"  (1.626 m)    Intake/Output Summary (Last 24 hours) at 03/05/2019 0818 Last data filed at 03/04/2019 1900 Gross per 24 hour  Intake 720 ml  Output -  Net 720 ml   Last 3 Weights 03/05/2019 03/03/2019 02/28/2019  Weight (lbs) 213 lb 9.6 oz 221 lb 219 lb  Weight (kg) 96.888 kg 100.245 kg 99.338 kg  Some encounter information is confidential and restricted. Go to Review Flowsheets activity to see all data.      Telemetry    SR at 70s - Personally Reviewed  ECG    N/A  Physical Exam   GEN: No acute distress.   Neck: No JVD Cardiac: RRR, + murmurs, rubs, or gallops.  Respiratory: Clear to auscultation bilaterally. GI: Soft, nontender, non-distended  MS: No edema; No deformity. Neuro:  Nonfocal  Psych: Normal affect   Labs    Chemistry Recent Labs  Lab 03/03/19 1914 03/04/19 0728  NA 137 134*  K 3.8 3.3*  CL 108 104  CO2 22 24  GLUCOSE 102* 82  BUN 12 12  CREATININE 0.83 1.04*  CALCIUM 8.7* 8.4*  PROT  --  7.3  ALBUMIN  --  3.5   AST  --  18  ALT  --  11  ALKPHOS  --  61  BILITOT  --  0.7  GFRNONAA >60 >60  GFRAA >60 >60  ANIONGAP 7 6     Hematology Recent Labs  Lab 03/03/19 1914 03/04/19 0728  WBC 7.4 6.7  RBC 4.62 4.37  HGB 11.0* 10.8*  HCT 37.2 36.2  MCV 80.5 82.8  MCH 23.8* 24.7*  MCHC 29.6* 29.8*  RDW 21.3* 20.9*  PLT 344 338    Cardiac Enzymes Recent Labs  Lab 03/04/19 0229 03/04/19 0728  TROPONINI <0.03 <0.03   No results for input(s): TROPIPOC in the last 168 hours.   BNP Recent Labs  Lab 03/03/19 1914  BNP 528.0*     DDimer No results for input(s): DDIMER in the last 168 hours.   Radiology    Dg Chest Portable 1 View  Result Date: 03/03/2019 CLINICAL DATA:  Acute shortness of breath and cough for 1 day. EXAM: PORTABLE CHEST 1 VIEW COMPARISON:  11/06/2018 chest CT, chest radiograph and prior studies. FINDINGS: Cardiomegaly and cardiac valve replacement again noted. Pulmonary vascular congestion and interstitial opacities are  noted. A small RIGHT pleural effusion is present. No pneumothorax or acute bony abnormality. IMPRESSION: Cardiomegaly with pulmonary vascular congestion and interstitial opacities, favor edema. Small RIGHT pleural effusion. Electronically Signed   By: Margarette Canada M.D.   On: 03/03/2019 20:48    Cardiac Studies   ECHO 03/04/19: 1. The left ventricle has normal systolic function with an ejection fraction of 60-65%. The cavity size was normal. Left ventricular diastolic Doppler parameters are consistent with pseudonormalization. There is right ventricular volume and pressure  overload. No evidence of left ventricular regional wall motion abnormalities. 2. The right ventricle has normal systolic function. The cavity was moderately enlarged. There is no increase in right ventricular wall thickness. Right ventricular systolic pressure is severely elevated with an estimated pressure of 88.1 mmHg. 3. Left atrial size was moderately dilated. 4. The mitral valve is  myxomatous. Moderate thickening of the mitral valve leaflet. Severe mitral valve stenosis. 5. Post mitral valve repair, annuloplasty ring. Post repair mitral stenosis (mean gradient 62mmHg - severe). 6. Tricuspid valve regurgitation is moderate. 7. The aortic valve is tricuspid. Aortic valve regurgitation was not assessed by color flow Doppler. 8. The inferior vena cava was dilated in size with <50% respiratory variability.  Patient Profile     Theresa Crawford is a 38 y.o. female with a hx of mitral valve repair in [place], annuloplasty ring with subsequent severe mitral stenosis who is being seen for the evaluation of diastolic heart failure.   Valve surgery was postponed in 2019 due to depression and suicidal ideation.  On 02/28/2019 when she spoke to [facility], [place] she was ready to reconsider valve surgery.   Assessment & Plan    1. Acute diastolic CHF in the setting of post mitral valve repair severe mitral stenosis and subsequent likely partially secondary pulmonary hypertension - Echo yesterday as above. Plan for R & L cath today with possible TEE.  - Dr. Roxy Manns to review study.  - BNP 528. Chest x-ray suggestive of pulmonary vascular congestion. -  Net I & O +720cc. Weight down 8 lb.  - Continue IV diuresis with supplemental potassium.   2. Depression/Anxiety    For questions or updates, please contact [place] Please consult [email or web address] for contact info under        Signed, Leanor Kail, PA  03/05/2019, 8:18 AM    Personally seen and examined. Agree with above.  Mitral valve repair in [place] with subsequent mitral stenosis 13 mmHg gradient, severe pulmonary hypertension likely secondary to stenosis.  GEN: Well nourished, well developed, in no acute distress  HEENT: normal  Neck: no JVD, carotid bruits, or masses Cardiac: RRR; soft diastolic and systolic murmur, no rubs, or gallops,no edema  Respiratory:  clear to auscultation bilaterally,  normal work of breathing GI: soft, nontender, nondistended, + BS MS: no deformity or atrophy  Skin: warm and dry, no rash Neuro:  Alert and Oriented x 3, Strength and sensation are intact Psych: euthymic mood, full affect  Mitral valve stenosis following mitral valve repair with annuloplasty ring in [place] Depression anxiety Acute diastolic heart failure  -Continuing with diuresis -Await TEE, right and left heart cath.  Questions answered. - Following these procedures, we will notify Dr. Roxy Manns - Already had dental work in preparation for valve surgery back in July 2019.  Unfortunately, she suffered from severe depression and was hospitalized shortly thereafter and did not undergo surgery.  She is now back and willing to proceed.  Understands the need for possible  mechanical mitral valve and long-term Coumadin usage.  Donato SchultzMark Mikaiah Stoffer, MD

## 2019-03-05 NOTE — Progress Notes (Signed)
PROGRESS NOTE  Theresa Crawford KVQ:[id number] DOB: 06/27/1981 DOA: 03/03/2019 PCP: Marline Backbone, [name]   LOS: 0 days   Patient is from: Home  Brief Narrative / Interim history: 38 year old female with history of anxiety, depression, sarcoidosis, hypertension, recurrent mitral stenosis/regurgitation status post repair, moderate pulmonary hypertension presenting with dyspnea and orthopnea.  Chest x-ray with cardiomegaly and pulmonary vascular congestion concerning for CHF exacerbation.  COVID-19 negative.  CBC and BMP not impressive.  Started on IV Lasix and admitted for CHF exacerbation. Cardiology consulted and evaluating for valve replacement.  Assessment & Plan: Acute diastolic CHF/severe pulmonary hypertension: chest x-ray concerning.  Echo with EF of 60 to 65%, impaired diastolic relaxation, RVSP to 88.1 mmHg, moderate LAE, mild edematous mitral valve, severe mitral stenosis and moderate TR. Denies cardiopulmonary symptoms this morning.  I&O not well captured in chart. -Cardiology managing-IV Lasix 40 mg twice daily -Continue home metoprolol. -Monitor daily weight, intake output and renal function -Closely monitor electrolytes and replenish aggressively  Recurrent mitral valve stenosis/regurgitation -Cardiology evaluating for valve repair/replacement  Anxiety/depression -Continue home Zoloft, trazodone, Seroquel and Ativan  Anemia of chronic disease: Stable .  Hemoglobin at baseline. -Continue ferrous sulfate -Anemia panel  History of asthma: Stable -Breathing treatments  Scheduled Meds: . enoxaparin (LOVENOX) injection  0.5 mg/kg Subcutaneous Q24H  . furosemide  40 mg Intravenous BID  . metoprolol succinate  37.5 mg Oral Daily  . potassium chloride SA  40 mEq Oral Daily  . QUEtiapine  50 mg Oral QHS  . saccharomyces boulardii  250 mg Oral Daily  . sertraline  100 mg Oral Daily  . sodium chloride flush  3 mL Intravenous Q12H  . traZODone  25-50 mg Oral QHS   Continuous  Infusions: . sodium chloride    . sodium chloride     PRN Meds:.sodium chloride, albuterol, docusate sodium, LORazepam, sodium chloride flush   DVT prophylaxis: Subcu Lovenox Code Status: Full code Family Communication: Patient update family let me know if there is any question Disposition Plan: Remains inpatient for further diuresis with IV Lasix and evaluation for mitral stenosis/regurg  Subjective: No major events overnight of this morning.  Lying in bed.  Denies chest pain, dyspnea, abdominal pain or GU symptoms.  Aware of the plan by cardiology today.   Objective: Vitals:   03/04/19 1826 03/04/19 2050 03/04/19 2110 03/05/19 0506  BP: 115/61 109/79 109/79 (!) 114/58  Pulse: 82 88 94 71  Resp:  (!) 25 (!) 28 12  Temp:  98.5 F (36.9 C) 98.3 F (36.8 C) 98.2 F (36.8 C)  TempSrc:  Oral Oral Oral  SpO2:  95% 94% 95%  Weight:    96.9 kg  Height:    5\' 4"  (1.626 m)    Intake/Output Summary (Last 24 hours) at 03/05/2019 0846 Last data filed at 03/04/2019 1900 Gross per 24 hour  Intake 720 ml  Output -  Net 720 ml   Filed Weights   03/03/19 1836 03/05/19 0506  Weight: 100.2 kg 96.9 kg    Examination:  GENERAL: No acute distress.  Appears well.  HEENT: MMM.  Vision and hearing grossly intact.  NECK: Supple.  No JVD.  LUNGS:  No IWOB. Good air movement bilaterally. HEART:  RRR. Heart sounds normal.  ABD: Bowel sounds present. Soft. Non tender.  MSK/EXT:  Moves all extremities. No apparent deformity. No edema bilaterally.  SKIN: no apparent skin lesion or wound NEURO: Awake, alert and oriented appropriately.  No gross deficit.  PSYCH: Calm.  Normal affect.   Consultants:   Cardiology  Procedures:   R/LHC on 6/22  Microbiology: . COVID-19 negative  Antimicrobials: Anti-infectives (From admission, onward)   None       Data Reviewed: I have independently reviewed following labs and imaging studies  CBC: Recent Labs  Lab 03/03/19 1914 03/04/19 0728  03/05/19 0803  WBC 7.4 6.7 6.8  HGB 11.0* 10.8* 11.4*  HCT 37.2 36.2 37.1  MCV 80.5 82.8 78.8*  PLT 344 338 386   Basic Metabolic Panel: Recent Labs  Lab 03/03/19 1914 03/04/19 0728  NA 137 134*  K 3.8 3.3*  CL 108 104  CO2 22 24  GLUCOSE 102* 82  BUN 12 12  CREATININE 0.83 1.04*  CALCIUM 8.7* 8.4*  MG 2.2  --    GFR: Estimated Creatinine Clearance: 82.1 mL/min (A) (by C-G formula based on SCr of 1.04 mg/dL (H)). Liver Function Tests: Recent Labs  Lab 03/04/19 0728  AST 18  ALT 11  ALKPHOS 61  BILITOT 0.7  PROT 7.3  ALBUMIN 3.5   No results for input(s): LIPASE, AMYLASE in the last 168 hours. No results for input(s): AMMONIA in the last 168 hours. Coagulation Profile: No results for input(s): INR, PROTIME in the last 168 hours. Cardiac Enzymes: Recent Labs  Lab 03/04/19 0229 03/04/19 0728  TROPONINI <0.03 <0.03   BNP (last 3 results) No results for input(s): PROBNP in the last 8760 hours. HbA1C: No results for input(s): HGBA1C in the last 72 hours. CBG: No results for input(s): GLUCAP in the last 168 hours. Lipid Profile: No results for input(s): CHOL, HDL, LDLCALC, TRIG, CHOLHDL, LDLDIRECT in the last 72 hours. Thyroid Function Tests: Recent Labs    03/04/19 0728  TSH 3.797   Anemia Panel: No results for input(s): VITAMINB12, FOLATE, FERRITIN, TIBC, IRON, RETICCTPCT in the last 72 hours. Urine analysis:    Component Value Date/Time   COLORURINE YELLOW 07/09/2018 1723   APPEARANCEUR CLEAR 07/09/2018 1723   LABSPEC 1.024 07/09/2018 1723   PHURINE 6.0 07/09/2018 1723   GLUCOSEU NEGATIVE 07/09/2018 1723   HGBUR LARGE (A) 07/09/2018 1723   BILIRUBINUR NEGATIVE 07/09/2018 1723   KETONESUR NEGATIVE 07/09/2018 1723   PROTEINUR 30 (A) 07/09/2018 1723   NITRITE NEGATIVE 07/09/2018 1723   LEUKOCYTESUR SMALL (A) 07/09/2018 1723   Sepsis Labs: Invalid input(s): PROCALCITONIN, LACTICIDVEN  Recent Results (from the past 240 hour(s))  SARS  Coronavirus 2 (CEPHEID- Performed [facility] lab), Hosp Order     Status: None   Collection Time: 03/03/19  7:14 PM   Specimen: Nasopharyngeal Swab  Result Value Ref Range Status   SARS Coronavirus 2 NEGATIVE NEGATIVE Final    Comment: (NOTE) If result is NEGATIVE SARS-CoV-2 target nucleic acids are NOT DETECTED. The SARS-CoV-2 RNA is generally detectable in upper and lower  respiratory specimens during the acute phase of infection. The lowest  concentration of SARS-CoV-2 viral copies this assay can detect is 250  copies / mL. A negative result does not preclude SARS-CoV-2 infection  and should not be used as the sole basis for treatment or other  patient management decisions.  A negative result may occur with  improper specimen collection / handling, submission of specimen other  than nasopharyngeal swab, presence of viral mutation(s) within the  areas targeted by this assay, and inadequate number of viral copies  (<250 copies / mL). A negative result must be combined with clinical  observations, patient history, and epidemiological information. If result is POSITIVE  SARS-CoV-2 target nucleic acids are DETECTED. The SARS-CoV-2 RNA is generally detectable in upper and lower  respiratory specimens dur ing the acute phase of infection.  Positive  results are indicative of active infection with SARS-CoV-2.  Clinical  correlation with patient history and other diagnostic information is  necessary to determine patient infection status.  Positive results do  not rule out bacterial infection or co-infection with other viruses. If result is PRESUMPTIVE POSTIVE SARS-CoV-2 nucleic acids MAY BE PRESENT.   A presumptive positive result was obtained on the submitted specimen  and confirmed on repeat testing.  While 2019 novel coronavirus  (SARS-CoV-2) nucleic acids may be present in the submitted sample  additional confirmatory testing may be necessary for epidemiological  and / or  clinical management purposes  to differentiate between  SARS-CoV-2 and other Sarbecovirus currently known to infect humans.  If clinically indicated additional testing with an alternate test  methodology [phone]([id number]) is advised. The SARS-CoV-2 RNA is generally  detectable in upper and lower respiratory sp ecimens during the acute  phase of infection. The expected result is Negative. Fact Sheet for Patients:  BoilerBrush.com.cyhttps://www.fda.gov/media/136312/download Fact Sheet for Healthcare Providers: https://pope.com/https://www.fda.gov/media/136313/download This test is not yet approved or cleared by the [place] FDA and has been authorized for detection and/or diagnosis of SARS-CoV-2 by FDA under an Emergency Use Authorization (EUA).  This EUA will remain in effect (meaning this test can be used) for the duration of the COVID-19 declaration under Section 564(b)(1) of the Act, 21 U.S.C. section 360bbb-3(b)(1), unless the authorization is terminated or revoked sooner. Performed at [facility], [address] [place], [place] [id number]      Radiology Studies: No results found.    T.  [facility]  If 7PM-7AM, please contact night-coverage [email or web address] Password [email or web address] 03/05/2019, 8:46 AM

## 2019-03-05 NOTE — Anesthesia Procedure Notes (Signed)
Procedure Name: MAC Date/Time: 03/05/2019 2:08 PM Performed by: Alain Marion, CRNA Pre-anesthesia Checklist: Patient identified, Emergency Drugs available, Suction available, Patient being monitored and Timeout performed Oxygen Delivery Method: Nasal cannula Placement Confirmation: positive ETCO2

## 2019-03-05 NOTE — Interval H&P Note (Signed)
History and Physical Interval Note:  03/05/2019 9:27 AM  Theresa Crawford  has presented today for surgery, with the diagnosis of mitral valve eval.  The various methods of treatment have been discussed with the patient and family. After consideration of risks, benefits and other options for treatment, the patient has consented to  Procedure(s): TRANSESOPHAGEAL ECHOCARDIOGRAM (TEE) (N/A) as a surgical intervention.  The patient's history has been reviewed, patient examined, no change in status, stable for surgery.  I have reviewed the patient's chart and labs.  Questions were answered to the patient's satisfaction.     Dorris Carnes

## 2019-03-05 NOTE — Interval H&P Note (Signed)
History and Physical Interval Note:  03/05/2019 3:01 PM  Theresa Crawford  has presented today for cardiac cath with the diagnosis of severe mitral restenosis.  The various methods of treatment have been discussed with the patient and family. After consideration of risks, benefits and other options for treatment, the patient has consented to  Procedure(s): RIGHT/LEFT HEART CATH AND CORONARY ANGIOGRAPHY (N/A) as a surgical intervention.  The patient's history has been reviewed, patient examined, no change in status, stable for surgery.  I have reviewed the patient's chart and labs.  Questions were answered to the patient's satisfaction.    Cath Lab Visit (complete for each Cath Lab visit)  Clinical Evaluation Leading to the Procedure:   ACS: No.  Non-ACS:    Anginal Classification: CCS II  Anti-ischemic medical therapy: Minimal Therapy (1 class of medications)  Non-Invasive Test Results: No non-invasive testing performed  Prior CABG: No previous CABG         Theresa Crawford

## 2019-03-05 NOTE — Anesthesia Preprocedure Evaluation (Addendum)
Anesthesia Evaluation  Patient identified by MRN, date of birth, ID band Patient awake    Reviewed: Allergy & Precautions, H&P , NPO status , Patient's Chart, lab work & pertinent test results  Airway Mallampati: II  TM Distance: >3 FB Neck ROM: Full    Dental no notable dental hx. (+) Dental Advisory Given   Pulmonary shortness of breath, pneumonia, Current Smoker,    Pulmonary exam normal        Cardiovascular Exercise Tolerance: Poor hypertension, Pt. on medications +CHF  Normal cardiovascular exam+ Valvular Problems/Murmurs MR   S/P MVR now with severe MV Stenosis by echo IMPRESSIONS    1. The left ventricle has normal systolic function with an ejection fraction of 60-65%. The cavity size was normal. Left ventricular diastolic Doppler parameters are consistent with pseudonormalization. There is right ventricular volume and pressure  overload. No evidence of left ventricular regional wall motion abnormalities.  2. The right ventricle has normal systolic function. The cavity was moderately enlarged. There is no increase in right ventricular wall thickness. Right ventricular systolic pressure is severely elevated with an estimated pressure of 88.1 mmHg.  3. Left atrial size was moderately dilated.  4. The mitral valve is myxomatous. Moderate thickening of the mitral valve leaflet. Severe mitral valve stenosis.  5. Post mitral valve repair, annuloplasty ring. Post repair mitral stenosis (mean gradient 17mHg - severe).  6. Tricuspid valve regurgitation is moderate.  7. The aortic valve is tricuspid. Aortic valve regurgitation was not assessed by color flow Doppler.  8. The inferior vena cava was dilated in size with <50% respiratory variability.    Neuro/Psych PSYCHIATRIC DISORDERS Anxiety Depression    GI/Hepatic GERD  ,  Endo/Other  Morbid obesity  Renal/GU      Musculoskeletal   Abdominal   Peds   Hematology  (+) anemia ,   Anesthesia Other Findings Pulmonary hypertension (HCC) Mitral valve stenosis  Reproductive/Obstetrics                            Anesthesia Physical  Anesthesia Plan  ASA: III  Anesthesia Plan: MAC   Post-op Pain Management:    Induction: Intravenous  PONV Risk Score and Plan: 2 and Ondansetron and Propofol infusion  Airway Management Planned: Natural Airway  Additional Equipment:   Intra-op Plan:   Post-operative Plan:   Informed Consent: I have reviewed the patients History and Physical, chart, labs and discussed the procedure including the risks, benefits and alternatives for the proposed anesthesia with the patient or authorized representative who has indicated his/her understanding and acceptance.     Dental advisory given  Plan Discussed with: Anesthesiologist and CRNA  Anesthesia Plan Comments: (  )       Anesthesia Quick Evaluation

## 2019-03-05 NOTE — Progress Notes (Signed)
Attempted Right AC IV pt pulled the IV out and refused additional attempts.

## 2019-03-06 ENCOUNTER — Telehealth: Payer: Self-pay

## 2019-03-06 ENCOUNTER — Encounter (HOSPITAL_COMMUNITY): Payer: Self-pay | Admitting: Cardiovascular Disease

## 2019-03-06 LAB — BASIC METABOLIC PANEL
Anion gap: 11 (ref 5–15)
BUN: 20 mg/dL (ref 6–20)
CO2: 20 mmol/L — ABNORMAL LOW (ref 22–32)
Calcium: 9.5 mg/dL (ref 8.9–10.3)
Chloride: 104 mmol/L (ref 98–111)
Creatinine, Ser: 1.06 mg/dL — ABNORMAL HIGH (ref 0.44–1.00)
GFR calc Af Amer: >60 mL/min (ref >60)
GFR calc non Af Amer: >60 mL/min (ref >60)
Glucose, Bld: 166 mg/dL — ABNORMAL HIGH (ref 70–99)
Potassium: 4.8 mmol/L (ref 3.5–5.1)
Sodium: 135 mmol/L (ref 135–145)

## 2019-03-06 LAB — IRON AND TIBC
Iron: 36 ug/dL (ref 28–170)
Saturation Ratios: 9 % — ABNORMAL LOW (ref 10.4–31.8)
TIBC: 381 ug/dL (ref 250–450)
UIBC: 345 ug/dL

## 2019-03-06 LAB — RETICULOCYTES
Immature Retic Fract: 20.4 % — ABNORMAL HIGH (ref 2.3–15.9)
RBC.: 5.06 MIL/uL (ref 3.87–5.11)
Retic Count, Absolute: 138.1 10*3/uL (ref 19.0–186.0)
Retic Ct Pct: 2.7 % (ref 0.4–3.1)

## 2019-03-06 LAB — CBC
HCT: 39.9 % (ref 36.0–46.0)
Hemoglobin: 12.3 g/dL (ref 12.0–15.0)
MCH: 24.4 pg — ABNORMAL LOW (ref 26.0–34.0)
MCHC: 30.8 g/dL (ref 30.0–36.0)
MCV: 79.2 fL — ABNORMAL LOW (ref 80.0–100.0)
Platelets: 460 10*3/uL — ABNORMAL HIGH (ref 150–400)
RBC: 5.04 MIL/uL (ref 3.87–5.11)
RDW: 20.8 % — ABNORMAL HIGH (ref 11.5–15.5)
WBC: 8.4 10*3/uL (ref 4.0–10.5)
nRBC: 0 % (ref 0.0–0.2)

## 2019-03-06 LAB — VITAMIN B12: Vitamin B-12: 376 pg/mL (ref 180–914)

## 2019-03-06 LAB — FOLATE: Folate: 13.6 ng/mL (ref >5.9)

## 2019-03-06 LAB — FERRITIN: Ferritin: 71 ng/mL (ref 11–307)

## 2019-03-06 MED ORDER — CLONAZEPAM 0.5 MG PO TABS
0.5000 mg | ORAL_TABLET | Freq: Two times a day (BID) | ORAL | Status: DC
  Administered 2019-03-06 – 2019-03-08 (×5): 0.5 mg via ORAL
  Filled 2019-03-06 (×5): qty 1

## 2019-03-06 MED ORDER — POTASSIUM CHLORIDE CRYS ER 10 MEQ PO TBCR
10.0000 meq | EXTENDED_RELEASE_TABLET | Freq: Every day | ORAL | Status: DC
  Administered 2019-03-07 – 2019-03-08 (×2): 10 meq via ORAL
  Filled 2019-03-06 (×2): qty 1

## 2019-03-06 MED ORDER — TRAZODONE HCL 50 MG PO TABS
50.0000 mg | ORAL_TABLET | Freq: Every day | ORAL | Status: DC
  Administered 2019-03-06 – 2019-03-07 (×2): 50 mg via ORAL
  Filled 2019-03-06 (×2): qty 1

## 2019-03-06 MED ORDER — BUSPIRONE HCL 5 MG PO TABS
7.5000 mg | ORAL_TABLET | Freq: Three times a day (TID) | ORAL | Status: DC
  Administered 2019-03-06 – 2019-03-08 (×6): 7.5 mg via ORAL
  Filled 2019-03-06 (×7): qty 2

## 2019-03-06 MED ORDER — FUROSEMIDE 40 MG PO TABS
40.0000 mg | ORAL_TABLET | Freq: Every day | ORAL | Status: DC
  Administered 2019-03-07 – 2019-03-08 (×2): 40 mg via ORAL
  Filled 2019-03-06 (×2): qty 1

## 2019-03-06 NOTE — Progress Notes (Addendum)
Progress Note  Patient Name: Theresa DaneLatasha Y Crawford Date of Encounter: 03/06/2019  Primary Cardiologist: Prentice DockerSuresh Koneswaran, MD   Subjective  Feeling well. No chest pain, sob or palpitations.   Inpatient Medications    Scheduled Meds: . enoxaparin (LOVENOX) injection  0.5 mg/kg Subcutaneous Q24H  . furosemide  40 mg Intravenous BID  . metoprolol succinate  37.5 mg Oral Daily  . potassium chloride SA  40 mEq Oral Daily  . QUEtiapine  50 mg Oral QHS  . saccharomyces boulardii  250 mg Oral Daily  . sertraline  100 mg Oral Daily  . sodium chloride flush  3 mL Intravenous Q12H  . traZODone  25-50 mg Oral QHS   Continuous Infusions: . sodium chloride     PRN Meds: sodium chloride, albuterol, docusate sodium, LORazepam, sodium chloride flush   Vital Signs    Vitals:   03/05/19 1734 03/05/19 2015 03/06/19 0500 03/06/19 0528  BP: 102/79 110/66  (!) 102/54  Pulse: 77 90  83  Resp: 20 20  20   Temp:  (!) 97.5 F (36.4 C)  98.1 F (36.7 C)  TempSrc:  Oral  Oral  SpO2: 93% 92%  95%  Weight:   97.5 kg   Height:        Intake/Output Summary (Last 24 hours) at 03/06/2019 0840 Last data filed at 03/05/2019 2047 Gross per 24 hour  Intake 850 ml  Output -  Net 850 ml   Last 3 Weights 03/06/2019 03/05/2019 03/03/2019  Weight (lbs) 214 lb 15.2 oz 213 lb 9.6 oz 221 lb  Weight (kg) 97.5 kg 96.888 kg 100.245 kg  Some encounter information is confidential and restricted. Go to Review Flowsheets activity to see all data.      Telemetry    NSR - Personally Reviewed  ECG    No new tracing   Physical Exam   GEN: No acute distress.   Neck: No JVD Cardiac: RRR, systolic murmurs, rubs, or gallops.  Respiratory: Clear to auscultation bilaterally. GI: Soft, nontender, non-distended  MS: No edema; No deformity. Neuro:  Nonfocal  Psych: Normal affect   Labs    High Sensitivity Troponin:  No results for input(s): TROPONINIHS in the last 720 hours.    Cardiac Enzymes Recent Labs  Lab  03/04/19 0229 03/04/19 0728  TROPONINI <0.03 <0.03   No results for input(s): TROPIPOC in the last 168 hours.   Chemistry Recent Labs  Lab 03/04/19 0728 03/05/19 0803 03/05/19 1534 03/05/19 1615 03/06/19 0409  NA 134* 138 143 143 135  K 3.3* 4.2 4.6 4.5 4.8  CL 104 108  --   --  104  CO2 24 22  --   --  20*  GLUCOSE 82 91  --   --  166*  BUN 12 16  --   --  20  CREATININE 1.04* 1.04*  --   --  1.06*  CALCIUM 8.4* 8.8*  --   --  9.5  PROT 7.3  --   --   --   --   ALBUMIN 3.5  --   --   --   --   AST 18  --   --   --   --   ALT 11  --   --   --   --   ALKPHOS 61  --   --   --   --   BILITOT 0.7  --   --   --   --   GFRNONAA >  60 >60  --   --  >60  GFRAA >60 >60  --   --  >60  ANIONGAP 6 8  --   --  11     Hematology Recent Labs  Lab 03/04/19 0728 03/05/19 0803 03/05/19 1534 03/05/19 1615 03/06/19 0409  WBC 6.7 6.8  --   --  8.4  RBC 4.37 4.71  --   --  5.04  HGB 10.8* 11.4* 14.3 13.3 12.3  HCT 36.2 37.1 42.0 39.0 39.9  MCV 82.8 78.8*  --   --  79.2*  MCH 24.7* 24.2*  --   --  24.4*  MCHC 29.8* 30.7  --   --  30.8  RDW 20.9* 20.7*  --   --  20.8*  PLT 338 386  --   --  460*    BNP Recent Labs  Lab 03/03/19 1914  BNP 528.0*     Radiology    No results found.  Cardiac Studies   RIGHT/LEFT HEART CATH AND CORONARY ANGIOGRAPHY 03/05/2019  Conclusion    Prox RCA to Mid RCA lesion is 10% stenosed.  Prox Cx to Mid Cx lesion is 20% stenosed.  Mid LAD lesion is 10% stenosed.   1. Mild non-obstructive CAD    TEE 03/05/2019  1. The left ventricle has mildly reduced systolic function, with an ejection fraction of 45-50%. The cavity size was normal.  2. The right ventricle has mildly reduced systolic function. The cavity was mildly enlarged.  3. LA, LAA without masses.  4. S/P Mitral valve repair. 2D and 3D imaging done. The mitral leaflet is thickened with restricted motion, particularly the posterior leaflet. Peak and mean gradients through the valve  are 13 and 8 mm Hg respectively. MVA by Pressure T1/2 is 1.85 cm2  consistent with mild MS 2D imaging suggests more moderate.  5. Tricuspid valve regurgitation is mild-moderate.  6. The aortic valve is tricuspid Aortic valve regurgitation is trivial by color flow Doppler.  ECHO 03/04/19: 1. The left ventricle has normal systolic function with an ejection fraction of 60-65%. The cavity size was normal. Left ventricular diastolic Doppler parameters are consistent with pseudonormalization. There is right ventricular volume and pressure  overload. No evidence of left ventricular regional wall motion abnormalities. 2. The right ventricle has normal systolic function. The cavity was moderately enlarged. There is no increase in right ventricular wall thickness. Right ventricular systolic pressure is severely elevated with an estimated pressure of 88.1 mmHg. 3. Left atrial size was moderately dilated. 4. The mitral valve is myxomatous. Moderate thickening of the mitral valve leaflet. Severe mitral valve stenosis. 5.Post mitral valve repair, annuloplasty ring. Post repair mitral stenosis (mean gradient 20mmHg - severe). 6. Tricuspid valve regurgitation is moderate. 7. The aortic valve is tricuspid. Aortic valve regurgitation was not assessed by color flow Doppler. 8. The inferior vena cava was dilated in size with <50% respiratory variability.   Patient Profile   Theresa Silversmith Nunnis a 38 y.o.femalewith a hx of mitral valve repair in [place], annuloplasty ring with subsequent severe mitral stenosiswho is being seen for the evaluation of diastolic heart failure.   Valve surgery was postponed in 2019 due to depression and suicidal ideation. On 02/28/2019 when she spoke to [facility] [place] she was ready to reconsider valve surgery.  Assessment & Plan    1. Acute diastolic CHF in the setting of post mitral valve repair severe mitral stenosis and subsequent likely partially secondary  pulmonary hypertension - Cath showed mild non  obstructive CAD. TEE with LVEF of 45-50% (was 60-65% by TTE last week,  The mitral leaflet is thickened with restricted motion, particularly the posterior leaflet. Peak and mean gradients through the valve are 13 and 8 mm Hg respectively. MVA by Pressure T1/2 is 1.85 cm2 consistent with mild MS 2D imaging suggests more moderate. - BNP 528. Chest x-ray suggestive of pulmonary vascular congestion. - Breathing improved with diuresis. Net I & O positive. Doubt accurate. Weight down 7 lb (221>>>214lb). Looks euvolemic.  - Switch lasix to PO 40mg  daily starting tomorrow with Kdur of 10 meq. She may take additional 20mg  of lasix of dyspnea or weight gain.  Continue Toprol XL at current dose.     For questions or updates, please contact [facility] Please consult [email or web address] for contact info under        Signed, Manson PasseyBhavinkumar Bhagat, PA  03/06/2019, 8:40 AM    Personally seen and examined. Agree with above.   Feels better with adequate diuresis. LVEDP now 7.  Cath with minimal CAD TEE after diuresis shows decreased gradient 8mmHg as to be expected with optimization. PA were down as well on cath. Change to PO lasix.  I will relay to Dr. Cornelius Moraswen so he can speak with her about potential mitral valve procedure.   Donato SchultzMark Skains, MD

## 2019-03-06 NOTE — Telephone Encounter (Signed)
I WAS GOING TO GET PATIENT SET UP FOR CATH BUT IT HAS ALREADY BEEN ORDERED.

## 2019-03-06 NOTE — Progress Notes (Signed)
PROGRESS NOTE  Theresa DaneLatasha Y Crawford ZOX:[id number] DOB: [birth year] DOA: 03/03/2019 PCP: Jerrell BelfastHouse, [name], [name]   LOS: 1 day   Patient is from: Home  Brief Narrative / Interim history: 38 year old female with history of anxiety, depression, sarcoidosis, hypertension, recurrent mitral stenosis/regurgitation status post repair, moderate pulmonary hypertension presenting with dyspnea and orthopnea.  Chest x-ray with cardiomegaly and pulmonary vascular congestion concerning for CHF exacerbation.  COVID-19 negative.  CBC and BMP not impressive.  Started on IV Lasix and admitted for CHF exacerbation. Cardiology consulted.  [facility]  TEE with EF of 45-50% with mild mitral stenosis.  R/LHC with mild nonobstructive CAD.  Assessment & Plan: Acute diastolic CHF/severe pulmonary hypertension: chest x-ray concerning.   -TTE with EF of 60 to 65%, impaired diastolic relaxation, RVSP to 88.1 mmHg, mod LAE, mild edematous mitral valve, severe mitral stenosis and moderate TR.  -TEE with EF of 45 to 50%, mild mitral stenosis  -R/LHC with mild nonobstructive CAD  -Patient appears stable from cardiopulmonary standpoint. -Cardiology following.  -Transitioned to oral Lasix 40 mg daily  -Continue home metoprolol. -Monitor daily weight, intake output and renal function -Closely monitor electrolytes and replenish aggressively  History of mitral valve stenosis/regurg status post mitral valve repair -TTE and TEE findings as above.  Anxiety/depression: anxiety seems to be not well controlled.  She is very tearful as she talks this morning. -We will change Ativan to Klonopin-longer half-life and less withdrawal symptoms -Add buspirone 7.5 mg 3 times daily -Continue home Zoloft, trazodone and Seroquel.  Anemia of chronic disease: Stable .  Hemoglobin at baseline. -Continue ferrous sulfate -Anemia panel  History of asthma: Stable -Breathing treatments  Scheduled Meds: .  busPIRone  7.5 mg Oral TID  . clonazePAM  0.5 mg Oral BID  . enoxaparin (LOVENOX) injection  0.5 mg/kg Subcutaneous Q24H  . [START ON 03/07/2019] furosemide  40 mg Oral Daily  . metoprolol succinate  37.5 mg Oral Daily  . [START ON 03/07/2019] potassium chloride SA  10 mEq Oral Daily  . QUEtiapine  50 mg Oral QHS  . saccharomyces boulardii  250 mg Oral Daily  . sertraline  100 mg Oral Daily  . sodium chloride flush  3 mL Intravenous Q12H  . traZODone  50 mg Oral QHS   Continuous Infusions: . sodium chloride     PRN Meds:.sodium chloride, albuterol, docusate sodium, sodium chloride flush   DVT prophylaxis: Subcu Lovenox Code Status: Full code Family Communication: Patient update family let me know if there is any question Disposition Plan: Remains inpatient pending clearance by cardiology for discharge.  Very anxious today.  Subjective: No major events overnight of this morning.  She is very anxious and tearful today.  Denies chest pain, dyspnea, palpitation or lightheadedness.  Denies GI or GU symptoms.   Objective: Vitals:   03/06/19 0528 03/06/19 0700 03/06/19 0839 03/06/19 0900  BP: (!) 102/54  114/70   Pulse: 83 73 79 86  Resp: 20 20 20  (!) 23  Temp: 98.1 F (36.7 C)     TempSrc: Oral     SpO2: 95% 92% 97% 94%  Weight:      Height:        Intake/Output Summary (Last 24 hours) at 03/06/2019 1312 Last data filed at 03/06/2019 1038 Gross per 24 hour  Intake 1570 ml  Output -  Net 1570 ml   Filed Weights   03/03/19 1836 03/05/19 0506 03/06/19 0500  Weight: 100.2 kg 96.9  kg 97.5 kg    Examination:  GENERAL: No acute distress.  Appears anxious HEENT: MMM.  Vision and hearing grossly intact.  NECK: Supple.  No JVD.  LUNGS:  No IWOB. Good air movement bilaterally. HEART:  RRR. Heart sounds normal.  ABD: Bowel sounds present. Soft. Non tender.  MSK/EXT:  Moves all extremities. No apparent deformity. No edema bilaterally.  SKIN: no apparent skin lesion or wound  NEURO: Awake, alert and oriented appropriately.  No gross deficit.  PSYCH: Appears anxious and tearful.  Consultants:   Cardiology  Procedures:   R/LHC on 6/22-mild nonobstructive CAD  Microbiology: . COVID-19 negative  Antimicrobials: Anti-infectives (From admission, onward)   None      Data Reviewed: I have independently reviewed following labs and imaging studies  CBC: Recent Labs  Lab 03/03/19 1914 03/04/19 0728 03/05/19 0803 03/05/19 1534 03/05/19 1615 03/06/19 0409  WBC 7.4 6.7 6.8  --   --  8.4  HGB 11.0* 10.8* 11.4* 14.3 13.3 12.3  HCT 37.2 36.2 37.1 42.0 39.0 39.9  MCV 80.5 82.8 78.8*  --   --  79.2*  PLT 344 338 386  --   --  460*   Basic Metabolic Panel: Recent Labs  Lab 03/03/19 1914 03/04/19 0728 03/05/19 0803 03/05/19 1534 03/05/19 1615 03/06/19 0409  NA 137 134* 138 143 143 135  K 3.8 3.3* 4.2 4.6 4.5 4.8  CL 108 104 108  --   --  104  CO2 22 24 22   --   --  20*  GLUCOSE 102* 82 91  --   --  166*  BUN 12 12 16   --   --  20  CREATININE 0.83 1.04* 1.04*  --   --  1.06*  CALCIUM 8.7* 8.4* 8.8*  --   --  9.5  MG 2.2  --  2.1  --   --   --    GFR: Estimated Creatinine Clearance: 80.8 mL/min (A) (by C-G formula based on SCr of 1.06 mg/dL (H)). Liver Function Tests: Recent Labs  Lab 03/04/19 0728  AST 18  ALT 11  ALKPHOS 61  BILITOT 0.7  PROT 7.3  ALBUMIN 3.5   No results for input(s): LIPASE, AMYLASE in the last 168 hours. No results for input(s): AMMONIA in the last 168 hours. Coagulation Profile: No results for input(s): INR, PROTIME in the last 168 hours. Cardiac Enzymes: Recent Labs  Lab 03/04/19 0229 03/04/19 0728  TROPONINI <0.03 <0.03   BNP (last 3 results) No results for input(s): PROBNP in the last 8760 hours. HbA1C: No results for input(s): HGBA1C in the last 72 hours. CBG: No results for input(s): GLUCAP in the last 168 hours. Lipid Profile: No results for input(s): CHOL, HDL, LDLCALC, TRIG, CHOLHDL, LDLDIRECT  in the last 72 hours. Thyroid Function Tests: Recent Labs    03/04/19 0728  TSH 3.797   Anemia Panel: No results for input(s): VITAMINB12, FOLATE, FERRITIN, TIBC, IRON, RETICCTPCT in the last 72 hours. Urine analysis:    Component Value Date/Time   COLORURINE YELLOW 07/09/2018 1723   APPEARANCEUR CLEAR 07/09/2018 1723   LABSPEC 1.024 07/09/2018 1723   PHURINE 6.0 07/09/2018 1723   GLUCOSEU NEGATIVE 07/09/2018 1723   HGBUR LARGE (A) 07/09/2018 1723   BILIRUBINUR NEGATIVE 07/09/2018 1723   KETONESUR NEGATIVE 07/09/2018 1723   PROTEINUR 30 (A) 07/09/2018 1723   NITRITE NEGATIVE 07/09/2018 1723   LEUKOCYTESUR SMALL (A) 07/09/2018 1723   Sepsis Labs: Invalid input(s): PROCALCITONIN, LACTICIDVEN  Recent Results (  from the past 240 hour(s))  SARS Coronavirus 2 (CEPHEID- Performed in [place] hospital lab), Hosp Order     Status: None   Collection Time: 03/03/19  7:14 PM   Specimen: Nasopharyngeal Swab  Result Value Ref Range Status   SARS Coronavirus 2 NEGATIVE NEGATIVE Final    Comment: (NOTE) If result is NEGATIVE SARS-CoV-2 target nucleic acids are NOT DETECTED. The SARS-CoV-2 RNA is generally detectable in upper and lower  respiratory specimens during the acute phase of infection. The lowest  concentration of SARS-CoV-2 viral copies this assay can detect is 250  copies / mL. A negative result does not preclude SARS-CoV-2 infection  and should not be used as the sole basis for treatment or other  patient management decisions.  A negative result may occur with  improper specimen collection / handling, submission of specimen other  than nasopharyngeal swab, presence of viral mutation(s) within the  areas targeted by this assay, and inadequate number of viral copies  (<250 copies / mL). A negative result must be combined with clinical  observations, patient history, and epidemiological information. If result is POSITIVE SARS-CoV-2 target nucleic acids are DETECTED. The  SARS-CoV-2 RNA is generally detectable in upper and lower  respiratory specimens dur ing the acute phase of infection.  Positive  results are indicative of active infection with SARS-CoV-2.  Clinical  correlation with patient history and other diagnostic information is  necessary to determine patient infection status.  Positive results do  not rule out bacterial infection or co-infection with other viruses. If result is PRESUMPTIVE POSTIVE SARS-CoV-2 nucleic acids MAY BE PRESENT.   A presumptive positive result was obtained on the submitted specimen  and confirmed on repeat testing.  While 2019 novel coronavirus  (SARS-CoV-2) nucleic acids may be present in the submitted sample  additional confirmatory testing may be necessary for epidemiological  and / or clinical management purposes  to differentiate between  SARS-CoV-2 and other Sarbecovirus currently known to infect humans.  If clinically indicated additional testing with an alternate test  methodology [phone]) is advised. The SARS-CoV-2 RNA is generally  detectable in upper and lower respiratory sp ecimens during the acute  phase of infection. The expected result is Negative. Fact Sheet for Patients:  StrictlyIdeas.no Fact Sheet for Healthcare Providers: BankingDealers.co.za This test is not yet approved or cleared by the [place] FDA and has been authorized for detection and/or diagnosis of SARS-CoV-2 by FDA under an Emergency Use Authorization (EUA).  This EUA will remain in effect (meaning this test can be used) for the duration of the COVID-19 declaration under Section 564(b)(1) of the Act, 21 U.S.C. section 360bbb-3(b)(1), unless the authorization is terminated or revoked sooner. Performed at [facility], [address], [place], [place] [address]      Radiology Studies: No results found.   Taye T. Fairbury  If 7PM-7AM, please contact night-coverage  [email or web address] Password [email or web address] 03/06/2019, 1:12 PM

## 2019-03-06 NOTE — Telephone Encounter (Signed)
-----   Message from Erlene Quan, [place] sent at 03/05/2019  2:13 PM EDT ----- I saw this patient I think in [place]- she needs a right and left heart cath for mitral valve disease.  Can you arrange, or if your to busy forward to the [facility] staff.  Thanks  LK ----- Message ----- From: Herminio Commons, MD Sent: 03/05/2019  10:56 AM EDT To: Erlene Quan, PA-C  Yes, please go ahead and arrange. I haven't seen her in over a year. ----- Message ----- From: Ilean China Sent: 03/01/2019  10:52 AM EDT To: Herminio Commons, MD  Dr Bronson Ing can you review this chart and my note from 6/17- looks like she needs a rt and Lt cath before we send her back to Dr Roxy Manns for consideration of MV surgery.  Kerin Ransom PA-C 03/01/2019 10:54 AM

## 2019-03-07 ENCOUNTER — Encounter (HOSPITAL_COMMUNITY): Payer: Self-pay | Admitting: Thoracic Surgery (Cardiothoracic Vascular Surgery)

## 2019-03-07 ENCOUNTER — Inpatient Hospital Stay (HOSPITAL_COMMUNITY): Payer: Self-pay

## 2019-03-07 DIAGNOSIS — R590 Localized enlarged lymph nodes: Secondary | ICD-10-CM

## 2019-03-07 DIAGNOSIS — Z9889 Other specified postprocedural states: Secondary | ICD-10-CM

## 2019-03-07 DIAGNOSIS — J8410 Pulmonary fibrosis, unspecified: Secondary | ICD-10-CM

## 2019-03-07 DIAGNOSIS — H04003 Unspecified dacryoadenitis, bilateral lacrimal glands: Secondary | ICD-10-CM

## 2019-03-07 DIAGNOSIS — H04129 Dry eye syndrome of unspecified lacrimal gland: Secondary | ICD-10-CM

## 2019-03-07 DIAGNOSIS — I5032 Chronic diastolic (congestive) heart failure: Secondary | ICD-10-CM

## 2019-03-07 DIAGNOSIS — I350 Nonrheumatic aortic (valve) stenosis: Secondary | ICD-10-CM

## 2019-03-07 LAB — BLOOD GAS, ARTERIAL
Acid-base deficit: 2.1 mmol/L — ABNORMAL HIGH (ref 0.0–2.0)
Bicarbonate: 22.2 mmol/L (ref 20.0–28.0)
Drawn by: 252031
FIO2: 21
O2 Saturation: 95.6 %
Patient temperature: 98.6
pCO2 arterial: 38.4 mmHg (ref 32.0–48.0)
pH, Arterial: 7.38 (ref 7.350–7.450)
pO2, Arterial: 78.9 mmHg — ABNORMAL LOW (ref 83.0–108.0)

## 2019-03-07 LAB — PULMONARY FUNCTION TEST
DL/VA % pred: 78 %
DL/VA: 3.51 ml/min/mmHg/L
DLCO cor % pred: 52 %
DLCO cor: 11.53 ml/min/mmHg
DLCO unc % pred: 50 %
DLCO unc: 11.12 ml/min/mmHg
FEF 25-75 Post: 5.07 L/sec
FEF 25-75 Pre: 4.07 L/sec
FEF2575-%Change-Post: 24 %
FEF2575-%Pred-Post: 176 %
FEF2575-%Pred-Pre: 141 %
FEV1-%Change-Post: 5 %
FEV1-%Pred-Post: 86 %
FEV1-%Pred-Pre: 81 %
FEV1-Post: 2.22 L
FEV1-Pre: 2.1 L
FEV1FVC-%Change-Post: 0 %
FEV1FVC-%Pred-Pre: 112 %
FEV6-%Change-Post: 6 %
FEV6-%Pred-Post: 78 %
FEV6-%Pred-Pre: 73 %
FEV6-Post: 2.39 L
FEV6-Pre: 2.24 L
FEV6FVC-%Pred-Post: 101 %
FEV6FVC-%Pred-Pre: 101 %
FVC-%Change-Post: 6 %
FVC-%Pred-Post: 77 %
FVC-%Pred-Pre: 72 %
FVC-Post: 2.39 L
FVC-Pre: 2.24 L
Post FEV1/FVC ratio: 93 %
Post FEV6/FVC ratio: 100 %
Pre FEV1/FVC ratio: 94 %
Pre FEV6/FVC Ratio: 100 %
RV % pred: 94 %
RV: 1.47 L
TLC % pred: 74 %
TLC: 3.76 L

## 2019-03-07 MED ORDER — PATIENT'S GUIDE TO USING COUMADIN BOOK
Freq: Once | Status: AC
  Administered 2019-03-07: 16:00:00
  Filled 2019-03-07: qty 1

## 2019-03-07 MED ORDER — ALBUTEROL SULFATE (2.5 MG/3ML) 0.083% IN NEBU
2.5000 mg | INHALATION_SOLUTION | Freq: Once | RESPIRATORY_TRACT | Status: AC
  Administered 2019-03-07: 2.5 mg via RESPIRATORY_TRACT

## 2019-03-07 MED ORDER — COUMADIN BOOK
Freq: Once | Status: AC
  Administered 2019-03-07: 16:00:00
  Filled 2019-03-07: qty 1

## 2019-03-07 MED ORDER — SODIUM CHLORIDE 0.9 % IV SOLN
510.0000 mg | Freq: Once | INTRAVENOUS | Status: AC
  Administered 2019-03-07: 15:00:00 510 mg via INTRAVENOUS
  Filled 2019-03-07: qty 17

## 2019-03-07 NOTE — Consult Note (Signed)
[address]       [name] [address]             [phone]          CARDIOTHORACIC SURGERY CONSULTATION REPORT  PCP is House, Eugenio Hoes, [name] Referring Provider is Donato Schultz, MD Primary Cardiologist is Prentice Docker, MD  Reason for consultation:  Mitral valve stenosis and regurgitation s/p mitral valve repair  HPI:  Patient is an obese 38 year old African-American female with long standing history of mitral valve disease and chronic diastolic congestive heart failure status post mitral valve repair in 2014 who has been referred for follow-up surgical consultation to discuss treatment options for management of recurrent mitral regurgitation and mitral stenosis.  Patient states that her heart disease began approximately 10 years ago when she was pregnant with her second child.  She states that she developed pericarditis and congestive heart failure.  She was eventually diagnosed with mitral valve disease and sarcoidosis.  She underwent mitral valve repair in 2014.  At the time she lived in [place].  By report her surgical repair included "resuspension of the posterior leaflet" and placement of a complete, semi-rigid CE Physio annuloplasty ring, size 30 mm.  Following surgery she states that she quickly began to experience worsening problems of congestive heart failure.  Within a year following surgery she had been hospitalized on several occasions with acute exacerbation of chronic diastolic congestive heart failure.  She was referred back to her cardiac surgeon and the possibility of redo surgery was discussed.  She moved to [place] in January 2018.  She has been followed intermittently since then by Dr. Purvis Sheffield, and she has been hospitalized on numerous occasions with acute exacerbation of chronic diastolic congestive heart failure and pulmonary edema.  Follow-up echocardiograms have documented the presence of normal left ventricular systolic function  with at least mild mitral regurgitation and increased transvalvular gradients consistent with moderate to severe mitral stenosis.  I had the opportunity to see her in consultation on 03/20/2018 and we discussed the indications, risks and potential benefits of redo mitral valve repair or replacement with possible tricuspid valve repair.  Continued medical therapy was discussed as an alternative, particularly given the likelihood that her mitral valve would need to be replaced at the time of surgery.  She expressed interest in proceeding with further diagnostic testing and surgery and was referred for dental clearance and diagnostic cardiac catheterization.  She underwent dental extraction and was scheduled for catheterization, but she cancelled at the last minute.  By report she had a "breakdown" and was hospitalized at [facility] for depression with suicidal ideation.  Patient is single and lives with her mother in [place].  She is unemployed.  She has 2 children that do not live with her, ages 57 and 6.  She describes chronic symptoms of exertional shortness of breath, orthopnea, lower extremity edema, and a chronic cough which have waxed and waned in severity but she states began immediately after her surgery in 2014.  She has been hospitalized on numerous occasions for acute exacerbations of chronic congestive heart failure and symptoms always improve following intravenous diuresis.  She states that recently at home she has been taking Bumex regularly but despite this she has problems with fluid retention.  She has chronic cough with occasional episodes of frank mopped assist.  She reports some occasional tightness across her chest which is typically transient and fleeting and not necessarily associated with shortness of breath.  Tightness in her chest is not related to physical exertion.  She admits to problems with chronic anxiety including the fact that she was recently hospitalized at behavioral  health because of extreme anxiety with suicidal ideation.  She states that she quit smoking approximately 1 month ago and she denies any illicit drug use.    Past Medical History:  Diagnosis Date   Anxiety    Arthritis    "lower back" (04/04/2018)   Chronic bronchitis (HCC)    Chronic diastolic CHF (congestive heart failure) (HCC)    Chronic lower back pain    DDD (degenerative disc disease), lumbar    Depression    GERD (gastroesophageal reflux disease)    Headache    "weekly" (04/04/2018)   Heart murmur    Hypertension    Mitral valve disease    Normocytic anemia    Obesity    Palpitations    Pericarditis    Premature atrial contractions    Pulmonary hypertension (HCC)    PVC's (premature ventricular contractions)    a. h/o palpitations with event monitor in 03/2017 showing NSR with PACs/PVCs.   Recurrent mitral valve stenosis and regurgitation s/p mitral valve repair    S/P mitral valve repair 03/27/2013   Dr. Darcus Austin - Natale Milch, PA - complex valvuloplasty including resuspension of entire posterior leaflet using Gore-tex neochords and 30 mm Edwards Physio ring annuloplasty   Sarcoidosis    Tobacco abuse    Tricuspid regurgitation     Past Surgical History:  Procedure Laterality Date   ABDOMINAL HERNIA REPAIR  ~ 2011   ABDOMINAL HYSTERECTOMY  2011   "partial"; PID w/problem with fallopian tubes, had left fallopian and left ovary removed, pt still having periods   CARDIAC CATHETERIZATION  2014   CESAREAN SECTION  2004; 2009   HERNIA REPAIR     MITRAL VALVE REPAIR  03/27/2013   Dr. Darcus Austin - Natale Milch, PA. - complex valvuloplasty including resuspension of posterior leaflet with 30 mm CE Physio ring annuloplasty   MULTIPLE EXTRACTIONS WITH ALVEOLOPLASTY N/A 04/03/2018   Procedure: Extraction of tooth #30 with alveoloplasty and gross debridement of remaining teeth;  Surgeon: Charlynne Pander, DDS;  Location: [facility] OR;  Service:  Oral Surgery;  Laterality: N/A;   PARTIAL HYSTERECTOMY  2011   PID w/problem with fallopian tubes, had left fallopian and left ovary removed, pt still having periods   RIGHT/LEFT HEART CATH AND CORONARY ANGIOGRAPHY N/A 03/05/2019   Procedure: RIGHT/LEFT HEART CATH AND CORONARY ANGIOGRAPHY;  Surgeon: Kathleene Hazel, MD;  Location: [facility] INVASIVE CV LAB;  Service: Cardiovascular;  Laterality: N/A;   TEE WITHOUT CARDIOVERSION N/A 05/26/2016   Procedure: TRANSESOPHAGEAL ECHOCARDIOGRAM (TEE);  Surgeon: [name], MD;  Location: [facility] ENDO SUITE;  Service: Cardiovascular;  Laterality: N/A;   TEE WITHOUT CARDIOVERSION N/A 01/25/2018   Procedure: TRANSESOPHAGEAL ECHOCARDIOGRAM (TEE) WITH PROPOFOL;  Surgeon: Jonelle Sidle, MD;  Location: [facility] END[facility] SUITE;  Service: Cardiovascular;  Laterality: N/A;    Family History  Problem Relation Age of Onset   Heart failure Father        just received LVAD   Heart disease Paternal Grandfather        stent placement    Social History   Socioeconomic History   Marital status: Single    Spouse name: Not on file   Number of children: 2   Years of education: Not on file   Highest education level: Not on file  Occupational History   Not  on file  Social Needs   Financial resource strain: Not on file   Food insecurity    Worry: Not on file    Inability: Not on file   Transportation needs    Medical: Not on file    Non-medical: Not on file  Tobacco Use   Smoking status: Current Some Day Smoker    Packs/day: 0.25    Years: 24.00    Pack years: 6.00    Types: Cigarettes    Start date: 11/03/1999   Smokeless tobacco: Never Used  Substance and Sexual Activity   Alcohol use: Yes    Comment: 04/04/2018 "1-2 beers/month; if that"   Drug use: Not Currently   Sexual activity: Not Currently  Lifestyle   Physical activity    Days per week: Not on file    Minutes per session: Not on file   Stress: Not on file    Relationships   Social connections    Talks on phone: Not on file    Gets together: Not on file    Attends religious service: Not on file    Active member of club or organization: Not on file    Attends meetings of clubs or organizations: Not on file    Relationship status: Not on file   Intimate partner violence    Fear of current or ex partner: Not on file    Emotionally abused: Not on file    Physically abused: Not on file    Forced sexual activity: Not on file  Other Topics Concern   Not on file  Social History Narrative   Not on file    Prior to Admission medications   Medication Sig Start Date End Date Taking? Authorizing Provider  bumetanide (BUMEX) 1 MG tablet Take 2 tablets (2 mg total) by mouth 2 (two) times daily. 02/28/19  Yes [name], MD  docusate sodium (COLACE) 100 MG capsule Take 100 mg by mouth daily as needed for moderate constipation.    Yes [provider]  EPINEPHrine (EPIPEN 2-PAK) 0.3 mg/0.3 mL IJ SOAJ injection Inject 0.3 Units into the muscle once.    Yes [provider]  ferrous sulfate 325 (65 FE) MG tablet Take 1 tablet (325 mg total) by mouth 2 (two) times daily with a meal. Patient taking differently: Take 325 mg by mouth daily with breakfast.  03/08/18  Yes Philip Aspen, Limmie Patricia, MD  folic acid (FOLVITE) 800 MCG tablet Take 400 mcg by mouth daily.   Yes [provider]  LORazepam (ATIVAN) 0.5 MG tablet Take 1 tablet (0.5 mg total) by mouth every 6 (six) hours as needed for anxiety or sleep. 04/15/18  Yes Oneta Rack, NP  metoprolol succinate (TOPROL-XL) 25 MG 24 hr tablet Take 1.5 tablets (37.5 mg total) by mouth daily. 02/28/19  Yes [name], MD  potassium chloride SA (K-DUR) 20 MEQ tablet Take 2 tablets (40 mEq total) by mouth daily. Take 2 tablets daily 02/28/19  Yes [name], MD  Probiotic Product (PROBIOTIC PO) Take 1 capsule by mouth daily.   Yes [provider]   QUEtiapine (SEROQUEL) 50 MG tablet Take 1 tablet (50 mg total) by mouth at bedtime. 04/15/18  Yes Oneta Rack, NP  sertraline (ZOLOFT) 100 MG tablet Take 1 tablet (100 mg total) by mouth daily. 04/16/18  Yes Oneta Rack, NP  traZODone (DESYREL) 50 MG tablet Take 0.5 tablets (25 mg total) by mouth at bedtime. Patient taking differently:  Take 25-50 mg by mouth at bedtime.  04/15/18  Yes Oneta Rack, NP  vitamin B-12 (CYANOCOBALAMIN) 1000 MCG tablet Take 1,000 mcg by mouth daily.   Yes [provider]  albuterol (PROVENTIL HFA;VENTOLIN HFA) 108 (90 Base) MCG/ACT inhaler Inhale 2 puffs into the lungs every 6 (six) hours as needed for wheezing or shortness of breath.    [provider]    Current Facility-Administered Medications  Medication Dose Route Frequency Provider Last Rate Last Dose   0.9 %  sodium chloride infusion  250 mL Intravenous PRN Kathleene Hazel, MD       albuterol (PROVENTIL) (2.5 MG/3ML) 0.083% nebulizer solution 3 mL  3 mL Inhalation Q6H PRN Pricilla Riffle, MD       busPIRone (BUSPAR) tablet 7.5 mg  7.5 mg Oral TID Candelaria Stagers T, MD   7.5 mg at 03/06/19 2127   clonazePAM (KLONOPIN) tablet 0.5 mg  0.5 mg Oral BID Candelaria Stagers T, MD   0.5 mg at 03/06/19 2127   docusate sodium (COLACE) capsule 100 mg  100 mg Oral Daily PRN Pricilla Riffle, MD       enoxaparin (LOVENOX) injection 50 mg  0.5 mg/kg Subcutaneous Q24H Pricilla Riffle, MD   50 mg at 03/06/19 0840   furosemide (LASIX) tablet 40 mg  40 mg Oral Daily Bhagat, Bhavinkumar, PA       metoprolol succinate (TOPROL-XL) 24 hr tablet 37.5 mg  37.5 mg Oral Daily Pricilla Riffle, MD   37.5 mg at 03/06/19 0839   potassium chloride (K-DUR) CR tablet 10 mEq  10 mEq Oral Daily Bhagat, Bhavinkumar, PA       QUEtiapine (SEROQUEL) tablet 50 mg  50 mg Oral QHS Pricilla Riffle, MD   50 mg at 03/06/19 2126   saccharomyces boulardii (FLORASTOR) capsule 250 mg  250 mg Oral Daily Pricilla Riffle, MD   250 mg at  03/06/19 1308   sertraline (ZOLOFT) tablet 100 mg  100 mg Oral Daily Pricilla Riffle, MD   100 mg at 03/06/19 6578   sodium chloride flush (NS) 0.9 % injection 3 mL  3 mL Intravenous Q12H Kathleene Hazel, MD   3 mL at 03/06/19 2129   sodium chloride flush (NS) 0.9 % injection 3 mL  3 mL Intravenous PRN Kathleene Hazel, MD       traZODone (DESYREL) tablet 50 mg  50 mg Oral QHS Almon Hercules, MD   50 mg at 03/06/19 2126    Allergies  Allergen Reactions   Bee Venom Anaphylaxis   Lisinopril Shortness Of Breath, Swelling and Other (See Comments)    Throat swelling   Penicillins Shortness Of Breath, Swelling and Other (See Comments)    Has patient had a PCN reaction causing immediate rash, facial/tongue/throat swelling, SOB or lightheadedness with hypotension: Yes Has patient had a PCN reaction causing severe rash involving mucus membranes or skin necrosis: Yes Has patient had a PCN reaction that required hospitalization No Has patient had a PCN reaction occurring within the last 10 years: No If all of the above answers are "NO", then may proceed with Cephalosporin use.    Perflutren Lipid Microsphere Shortness Of Breath and Other (See Comments)    Other reaction(s): Chest Pain/Tightness   Shellfish Allergy Anaphylaxis   Contrast Media [Iodinated Diagnostic Agents] Hives, Itching and Other (See Comments)    Itching and hives to throat, neck, face and arms.   No difficulty breathing.  This was given at [facility] approximately 2015. Contrast Dye      Review of Systems:   General:  normal appetite, decreased energy, + weight gain, + weight loss, no fever  Cardiac:  no chest pain with exertion, + occasional fleeting chest pain at rest, +SOB with exertion, + resting SOB, no PND, + orthopnea, + palpitations, no arrhythmia, no atrial fibrillation, + LE edema, no dizzy spells, no syncope  Respiratory:  + shortness of breath, no home oxygen, no productive  cough, + chronic dry cough, no bronchitis, + wheezing, occasional hemoptysis, no asthma, no pain with inspiration or cough, no sleep apnea, no CPAP at night  GI:   no difficulty swallowing, no reflux, no frequent heartburn, no hiatal hernia, no abdominal pain, no constipation, no diarrhea, no hematochezia, no hematemesis, no melena  GU:   no dysuria,  no frequency, no urinary tract infection, no hematuria, no kidney stones, no kidney disease  Vascular:  no pain suggestive of claudication, no pain in feet, no leg cramps, no varicose veins, no DVT, no non-healing foot ulcer  Neuro:   no stroke, no TIA's, no seizures, no headaches, + temporary blindness one eye typically on left side,  no slurred speech, no peripheral neuropathy, no chronic pain, no instability of gait, no memory/cognitive dysfunction  Musculoskeletal: no arthritis, no joint swelling, no myalgias, no difficulty walking, normal mobility   Skin:   no rash, no itching, no skin infections, no pressure sores or ulcerations  Psych:   + anxiety, + depression, + nervousness, no unusual recent stress  Eyes:   + blurry vision, no floaters, + recent vision changes, + wears glasses or contacts  ENT:   no hearing loss, no loose or painful teeth, no dentures, last saw dentist within the past year  Hematologic:  no easy bruising, no abnormal bleeding, no clotting disorder, no frequent epistaxis  Endocrine:  no diabetes, does not check CBG's at home     Physical Exam:   BP 99/60 (BP Location: Right Arm)    Pulse 80    Temp 98 F (36.7 C) (Oral)    Resp 16    Ht  (1.626 m)    Wt 97.5 kg    LMP 02/18/2019 (Exact Date)    SpO2 92%    BMI 36.90 kg/m   General:  Obese female NAD   HEENT:  Unremarkable   Neck:   no JVD, no bruits, no adenopathy   Chest:   clear to auscultation, symmetrical breath sounds, no wheezes, no rhonchi   CV:   RRR, no murmur   Abdomen:  soft, non-tender, no masses   Extremities:  warm, well-perfused, pulses diminished,  no lower extremity edema  Rectal/GU  Deferred  Neuro:   Grossly non-focal and symmetrical throughout  Skin:   Clean and dry, no rashes, no breakdown  Diagnostic Tests:  Lab Results: Recent Labs    03/05/19 0803  03/05/19 1615 03/06/19 0409  WBC 6.8  --   --  8.4  HGB 11.4*   < > 13.3 12.3  HCT 37.1   < > 39.0 39.9  PLT 386  --   --  460*   < > = values in this interval not displayed.   BMET:  Recent Labs    03/05/19 0803  03/05/19 1615 03/06/19 0409  NA 138   < > 143 135  K 4.2   < > 4.5 4.8  CL 108  --   --  104  CO2 22  --   --  20*  GLUCOSE 91  --   --  166*  BUN 16  --   --  20  CREATININE 1.04*  --   --  1.06*  CALCIUM 8.8*  --   --  9.5   < > = values in this interval not displayed.    Results for Theresa Crawford, Theresa Crawford (MRN [id number]) as of 03/07/2019 05:38  Ref. Range 11/06/2018 00:51 03/03/2019 19:14 03/04/2019 02:29 03/04/2019 07:28  B Natriuretic Peptide Latest Ref Range: 0.0 - 100.0 pg/mL 440.0 (H) 528.0 (H)    Troponin I Latest Ref Range: <0.03 ng/mL <0.03  <0.03 <0.03     CBG (last 3)  No results for input(s): GLUCAP in the last 72 hours. PT/INR:  No results for input(s): LABPROT, INR in the last 72 hours.   CXR:  PORTABLE CHEST 1 VIEW  COMPARISON:  11/06/2018 chest CT, chest radiograph and prior studies.  FINDINGS: Cardiomegaly and cardiac valve replacement again noted.  Pulmonary vascular congestion and interstitial opacities are noted.  A small RIGHT pleural effusion is present.  No pneumothorax or acute bony abnormality.  IMPRESSION: Cardiomegaly with pulmonary vascular congestion and interstitial opacities, favor edema. Small RIGHT pleural effusion.   Electronically Signed   By: Harmon PierJeffrey  Hu M.D.   On: 03/03/2019 20:48   CT ANGIOGRAPHY CHEST WITH CONTRAST  TECHNIQUE: Multidetector CT imaging of the chest was performed using the standard protocol during bolus administration of intravenous contrast. Multiplanar CT image  reconstructions and MIPs were obtained to evaluate the vascular anatomy.  CONTRAST:  75mL ISOVUE-370 IOPAMIDOL (ISOVUE-370) INJECTION 76%  COMPARISON:  Chest x-ray from earlier in the same day, CT of the chest from 03/30/2018  FINDINGS: Cardiovascular: Thoracic aorta demonstrates a normal branching pattern. No aneurysmal dilatation or dissection is seen. Mild cardiac enlargement is noted. Mitral valve repair is seen. The pulmonary artery shows a normal branching pattern. No filling defect to suggest pulmonary embolism is identified. No significant coronary calcifications are seen.  Mediastinum/Nodes: The esophagus is within normal limits. The thoracic inlet shows no acute abnormality. Multiple lymph nodes are noted throughout the mediastinum particularly in the superior and anterior mediastinum which are consistent with the patient's given clinical history of sarcoidosis. Some of these demonstrate diffuse calcification. The overall bulk of the lymphadenopathy has increased in the interval from the prior exam. Subcarinal lymphadenopathy is also noted which has increased in the interval from the prior exam.  Lungs/Pleura: Diffuse ground-glass attenuation is noted throughout the lungs bilaterally. Some increased parenchymal scarring is noted in the left lower lobe when compared with the prior exam. Scattered calcified granulomas are noted consistent with the given clinical history. Diffuse septal thickening consistent with a degree of CHF is noted. No focal confluent infiltrate is seen. No sizable effusion is noted.  Upper Abdomen: Visualized upper abdomen is within normal limits.  Musculoskeletal: Mild degenerative changes of the thoracic spine are noted. No acute bony abnormality is seen.  Review of the MIP images confirms the above findings.  IMPRESSION: Changes consistent with the given clinical history of sarcoidosis with mediastinal adenopathy and evidence of  granulomatous disease. Additionally a portion of the changes in the lung are felt to be related to the underlying sarcoidosis particularly in the lower lobes bilaterally.  Diffuse ground-glass opacity as well as septal thickening consistent with a degree of superimposed congestive failure.  No evidence of pulmonary embolism.   Electronically Signed   By: Alcide CleverMark  Lukens  M.D.   On: 11/06/2018 07:51        ECHOCARDIOGRAM REPORT    Patient Name:   Theresa DaneLATASHA Crawford Dellis Date of Exam: 03/04/2019 Medical Rec #:  [id number]      Height:       64.0 in Accession #:    [id number]     Weight:       221.0 lb Date of Birth:  17-Jun-1981      BSA:          2.04 m Patient Age:    38 years       BP:           110/69 mmHg Patient Gender: F              HR:           86 bpm. Exam Location:  Inpatient    Procedure: 2D Echo  Indications:    CHF 428   History:        Patient has prior history of Echocardiogram examinations, most                 recent 09/10/2017. CHF. S/p MV repair. tobacco abuse. pulmonary                 htn. MS. Mitral Valve Disease.   Sonographer:    Celene SkeenVijay Shankar RDCS (AE) Referring Phys: [id number] JAMES KIM  IMPRESSIONS    1. The left ventricle has normal systolic function with an ejection fraction of 60-65%. The cavity size was normal. Left ventricular diastolic Doppler parameters are consistent with pseudonormalization. There is right ventricular volume and pressure  overload. No evidence of left ventricular regional wall motion abnormalities.  2. The right ventricle has normal systolic function. The cavity was moderately enlarged. There is no increase in right ventricular wall thickness. Right ventricular systolic pressure is severely elevated with an estimated pressure of 88.1 mmHg.  3. Left atrial size was moderately dilated.  4. The mitral valve is myxomatous. Moderate thickening of the mitral valve leaflet. Severe mitral valve stenosis.  5. Post mitral valve repair,  annuloplasty ring. Post repair mitral stenosis (mean gradient 13mmHg - severe).  6. Tricuspid valve regurgitation is moderate.  7. The aortic valve is tricuspid. Aortic valve regurgitation was not assessed by color flow Doppler.  8. The inferior vena cava was dilated in size with <50% respiratory variability.  FINDINGS  Left Ventricle: The left ventricle has normal systolic function, with an ejection fraction of 60-65%. The cavity size was normal. There is no increase in left ventricular wall thickness. Left ventricular diastolic Doppler parameters are consistent with  pseudonormalization. There is the interventricular septum is flattened in systole and diastole, consistent with right ventricular pressure and volume overload. No evidence of left ventricular regional wall motion abnormalities..  Right Ventricle: The right ventricle has normal systolic function. The cavity was moderately enlarged. There is no increase in right ventricular wall thickness. Right ventricular systolic pressure is severely elevated with an estimated pressure of 88.1  mmHg.  Left Atrium: Left atrial size was moderately dilated.  Right Atrium: Right atrial size was normal in size. Right atrial pressure is estimated at 15 mmHg.  Interatrial Septum: No atrial level shunt detected by color flow Doppler.  Pericardium: There is no evidence of pericardial effusion.  Mitral Valve: The mitral valve is myxomatous. Moderate thickening of the mitral valve leaflet. Mitral valve regurgitation is trivial by color flow Doppler. Severe mitral valve stenosis. Post mitral valve repair, annuloplasty  ring. Post repair mitral  stenosis (mean gradient - severe).  Tricuspid Valve: The tricuspid valve is normal in structure. Tricuspid valve regurgitation is moderate by color flow Doppler.  Aortic Valve: The aortic valve is tricuspid Aortic valve regurgitation was not assessed by color flow Doppler. There is no evidence of  aortic valve stenosis.  Pulmonic Valve: The pulmonic valve was normal in structure. Pulmonic valve regurgitation was not assessed by color flow Doppler.  Venous: The inferior vena cava is dilated in size with less than 50% respiratory variability.    +--------------+--------++  LEFT VENTRICLE            +--------------+---------++ +--------------+--------++  Diastology                  PLAX 2D                   +--------------+---------++ +--------------+--------++  LV e' lateral: 8.92 cm/s    LVIDd:         4.17 cm    +--------------+---------++ +--------------+--------++  LVIDs:         2.89 cm    +--------------+--------++  LV PW:         1.02 cm    +--------------+--------++  LV IVS:        0.91 cm    +--------------+--------++  LVOT diam:     1.90 cm    +--------------+--------++  LV SV:         45 ml      +--------------+--------++  LV SV Index:   20.83      +--------------+--------++  LVOT Area:     2.84 cm   +--------------+--------++                            +--------------+--------++  +---------------+---------++  RIGHT VENTRICLE             +---------------+---------++  RV Mid diam:    4.51 cm     +---------------+---------++  RVSP:           88.1 mmHg   +---------------+---------++  +---------------+-------++-----------++  LEFT ATRIUM              Index         +---------------+-------++-----------++  LA diam:        4.60 cm  2.25 cm/m    +---------------+-------++-----------++  LA Vol (A2C):   50.8 ml  24.89 ml/m   +---------------+-------++-----------++  LA Vol (A4C):   33.6 ml  16.46 ml/m   +---------------+-------++-----------++  LA Biplane Vol: 43.9 ml  21.51 ml/m   +---------------+-------++-----------++ +------------+----------++-----------++  RIGHT ATRIUM             Index         +------------+----------++-----------++  RA Pressure: 15.00 mmHg                +------------+----------++-----------++  RA Area:     12.60 cm                  +------------+----------++-----------++  RA Volume:   27.90 ml    13.67 ml/m   +------------+----------++-----------++  +------------+-----------++  AORTIC VALVE               +------------+-----------++  LVOT Vmax:   102.00 cm/s   +------------+-----------++  LVOT Vmean:  62.100 cm/s   +------------+-----------++  LVOT VTI:    0.183 m       +------------+-----------++   +-------------+-------++  AORTA                   +-------------+-------++  Ao Root diam: 2.40 cm   +-------------+-------++  +-------------+----------++ +---------------+-----------++  MITRAL VALVE                TRICUSPID VALVE               +-------------+----------++ +---------------+-----------++  MV Peak grad: 19.1 mmHg     TR Peak grad:   73.1 mmHg     +-------------+----------++ +---------------+-----------++  MV Mean grad: 12.0 mmHg     TR Vmax:        453.00 cm/s   +-------------+----------++ +---------------+-----------++  MV Vmax:      2.18 m/s      Estimated RAP:  15.00 mmHg    +-------------+----------++ +---------------+-----------++  MV Vmean:     163.0 cm/s    RVSP:           88.1 mmHg     +-------------+----------++ +---------------+-----------++  MV VTI:       0.58 m       +-------------+----------++ +--------------+-------+                              SHUNTS                                              +--------------+-------+                              Systemic VTI:  0.18 m                               +--------------+-------+                              Systemic Diam: 1.90 cm                              +--------------+-------+    Donato Schultz MD Electronically signed by Donato Schultz MD Signature Date/Time: 03/04/2019/12:47:37 PM        TRANSESOPHOGEAL ECHO REPORT    Patient Name:   Theresa Crawford Date of Exam: 03/05/2019 Medical Rec #:  [id number]      Height:       64.0 in Accession #:    [id number]     Weight:       213.6 lb Date of Birth:  [birth year]      BSA:           2.01 m Patient Age:    38 years       BP:           114/58 mmHg Patient Gender: F              HR:           71 bpm. Exam Location:  Inpatient    Procedure: Transesophageal Echo  Indications:     Mitral stenosis   History:         Patient has prior history of Echocardiogram examinations, most                  recent 03/04/2019. CHF Pulmonary HTN MV repair and Mitral Valve  Disease Signs/Symptoms: Shortness of Breath Risk Factors:                  Hypertension and Current Smoker.   Sonographer:     Ross LudwigArthur Guy RDCS (AE) Referring Phys:  [id number] Jake BatheMARK C SKAINS Diagnosing Phys: Dietrich PatesPaula Ross MD     PROCEDURE: The transesophogeal probe was passed through the esophogus of the patient. The patient developed no complications during the procedure.  IMPRESSIONS    1. The left ventricle has mildly reduced systolic function, with an ejection fraction of 45-50%. The cavity size was normal.  2. The right ventricle has mildly reduced systolic function. The cavity was mildly enlarged.  3. LA, LAA without masses.  4. S/P Mitral valve repair. 2D and 3D imaging done. The mitral leaflet is thickened with restricted motion, particularly the posterior leaflet. Peak and mean gradients through the valve are 13 and 8 mm Hg respectively. MVA by Pressure T1/2 is 1.85 cm2  consistent with mild MS 2D imaging suggests more moderate.  5. Tricuspid valve regurgitation is mild-moderate.  6. The aortic valve is tricuspid Aortic valve regurgitation is trivial by color flow Doppler.  FINDINGS  Left Ventricle: The left ventricle has mildly reduced systolic function, with an ejection fraction of 45-50%. The cavity size was normal.  Right Ventricle: The right ventricle has mildly reduced systolic function. The cavity was mildly enlarged.  Left Atrium: LA, LAA without masses.   Mitral Valve: S/P Mitral valve repair. 2D and 3D imaging done. The mitral leaflet is thickened with  restricted motion, particularly the posterior leaflet. Peak and mean gradients through the valve are 13 and 8 mm Hg respectively. MVA by Pressure T1/2 is  1.85 cm2 consistent with mild MS 2D imaging suggests more moderate.  Tricuspid Valve: The tricuspid valve was normal in structure. Tricuspid valve regurgitation is mild-moderate by color flow Doppler.  Aortic Valve: The aortic valve is tricuspid Aortic valve regurgitation is trivial by color flow Doppler.    +-------------+----------++  MITRAL VALVE               +-------------+----------++  MV Peak grad: 13.5 mmHg    +-------------+----------++  MV Mean grad: 8.0 mmHg     +-------------+----------++  MV Vmax:      1.84 m/s     +-------------+----------++  MV Vmean:     136.0 cm/s   +-------------+----------++  MV VTI:       0.46 m       +-------------+----------++    Dietrich PatesPaula Ross MD Electronically signed by Dietrich PatesPaula Ross MD Signature Date/Time: 03/05/2019/5:45:09 PM      RIGHT/LEFT HEART CATH AND CORONARY ANGIOGRAPHY  Conclusion    Prox RCA to Mid RCA lesion is 10% stenosed.  Prox Cx to Mid Cx lesion is 20% stenosed.  Mid LAD lesion is 10% stenosed.   1. Mild non-obstructive CAD     Recommendations  Antiplatelet/Anticoag No further ischemic workup. Proceed with evaluation for valve surgery.  Surgeon Notes    03/05/2019 2:28 PM CV Procedure signed by Pricilla Riffleoss, Paula V, MD  Indications  Severe mitral valve stenosis [I05.0 (ICD-10-CM)]  Procedural Details  Technical Details Indication: Severe Mitral stenosis  Procedure: The risks, benefits, complications, treatment options, and expected outcomes were discussed with the patient. The patient and/or family concurred with the proposed plan, giving informed consent. The patient was brought to the cath lab after IV hydration was given. The patient was sedated with Versed and Fentanyl. The left wrist was prepped and draped in  a sterile fashion. 1% lidocaine was used  for local anesthesia. Using the modified Seldinger access technique, a 5 French sheath was placed in the left radial artery. 3 mg Verapamil was given through the sheath. 5000 units IV heparin was given. Standard diagnostic catheters were used to perform selective coronary angiography. A pigtail catheter was used to cross the aortic valve. LV pressures measured. No LV gram performed. The sheath was removed from the left radial artery and a Terumo hemostasis band was applied at the arteriotomy site on the left wrist.    Estimated blood loss <50 mL.   During this procedure medications were administered to achieve and maintain moderate conscious sedation while the patient's heart rate, blood pressure, and oxygen saturation were continuously monitored and I was present face-to-face 100% of this time.  Medications (Filter: Administrations occurring from 03/05/19 1509 to 03/05/19 1656) (important)  Continuous medications are totaled by the amount administered until 03/05/19 1656.  Medication Rate/Dose/Volume Action  Date Time   Heparin (Porcine) in NaCl 1000-0.9 UT/500ML-% SOLN (mL) 500 mL Given 03/05/19 1512   Total dose as of 03/05/19 1656 500 mL Given 1512   1,000 mL        diphenhydrAMINE (BENADRYL) injection (mg) 50 mg Given 03/05/19 1513   Total dose as of 03/05/19 1656        50 mg        midazolam (VERSED) injection (mg) 1 mg Given 03/05/19 1521   Total dose as of 03/05/19 1656 1 mg Given 1538   2 mg        fentaNYL (SUBLIMAZE) injection (mcg) 25 mcg Given 03/05/19 1521   Total dose as of 03/05/19 1656 25 mcg Given 1539   50 mcg        lidocaine (PF) (XYLOCAINE) 1 % injection (mL) 2 mL Given 03/05/19 1528   Total dose as of 03/05/19 1656 2 mL Given 1533   14 mL 10 mL Given 1551   Radial Cocktail/Verapamil only (mL) Canceled Entry 03/05/19    Total dose as of 03/05/19 1656 10 mL Given 1612   10 mL        heparin injection (Units) 5,000 Units Given 03/05/19 1618   Total dose as of  03/05/19 1656        5,000 Units        iohexol (OMNIPAQUE) 350 MG/ML injection (mL) 55 mL Given 03/05/19 1637   Total dose as of 03/05/19 1656        55 mL        0.9 % sodium chloride infusion (mL/hr) 999 mL/hr Rate/Dose Change 03/05/19 1610   Dosing weight:  100.2 kg        Total dose as of 03/05/19 1656        778.39 mL        Sedation Time  Sedation Time Physician-1: 1 hour 10 minutes 59 seconds  Complications  Complications documented before study signed (03/05/2019 4:57 PM)   RIGHT/LEFT HEART CATH AND CORONARY ANGIOGRAPHY  None Documented by Burnell Blanks, MD 03/05/2019 4:38 PM  Date Found: 03/05/2019  Time Range: Intraprocedure      Coronary Findings  Diagnostic Dominance: Right Left Anterior Descending  Vessel is large.  Mid LAD lesion 10% stenosed  Mid LAD lesion is 10% stenosed.  Left Circumflex  Vessel is large.  Prox Cx to Mid Cx lesion 20% stenosed  Prox Cx to Mid Cx lesion is 20% stenosed.  Right Coronary Artery  Vessel is large.  Prox RCA to Mid RCA lesion 10% stenosed  Prox RCA to Mid RCA lesion is 10% stenosed.  Intervention  No interventions have been documented. Coronary Diagrams  Diagnostic Dominance: Right  Intervention  Implants   No implant documentation for this case.  Syngo Images  Show images for CARDIAC CATHETERIZATION  Images on Long Term Storage  Show images for Elayne, Gruver to Procedure Log  Procedure Log    Hemo Data   Most Recent Value  Fick Cardiac Output 6.17 L/min  Fick Cardiac Output Index 3.06 (L/min)/BSA  Aortic Mean Gradient 6.4 mmHg  Aortic Peak Gradient 7 mmHg  Aortic Valve Area 2.59  Aortic Value Area Index 1.29 cm2/BSA  RA A Wave 9 mmHg  RA V Wave 9 mmHg  RA Mean 6 mmHg  RV Systolic Pressure 47 mmHg  RV Diastolic Pressure 2 mmHg  RV EDP 7 mmHg  PA Systolic Pressure 48 mmHg  PA Diastolic Pressure 20 mmHg  PA Mean 30 mmHg  PW A Wave 17 mmHg  PW V Wave 19 mmHg  PW Mean 16  mmHg  AO Systolic Pressure 98 mmHg  AO Diastolic Pressure 64 mmHg  AO Mean 79 mmHg  LV Systolic Pressure 99 mmHg  LV Diastolic Pressure 0 mmHg  LV EDP 6 mmHg  AOp Systolic Pressure 99 mmHg  AOp Diastolic Pressure 58 mmHg  AOp Mean Pressure 76 mmHg  LVp Systolic Pressure 106 mmHg  LVp Diastolic Pressure 1 mmHg  LVp EDP Pressure 6 mmHg  QP/QS 1  TPVR Index 9.79 HRUI  TSVR Index 25.76 HRUI  PVR SVR Ratio 0.19  TPVR/TSVR Ratio 0.38    Pulmonary Function Tests  Baseline      Post-bronchodilator  FVC  2.24 L  (72% predicted) FVC  2.39 L  (77% predicted) FEV1  2.10 L  (81% predicted) FEV1  2.22 L  (86% predicted) FEF25-75 4.07 L  (141% predicted) FEF25-75 5.07 L  (176% predicted)  TLC  3.76 L  (74% predicted) RV  1.47 L  (94% predicted) DLCO  50% predicted       Impression:  I have personally reviewed the patient's recent transthoracic and transesophageal echocardiograms and diagnostic cardiac catheterization.  She underwent elective mitral valve repair in 2014 and has at least moderate and possibly severe mitral stenosis with mild mitral regurgitation.  She has suffered from symptoms of chronic diastolic congestive heart failure requiring numerous hospitalizations for acute exacerbation over the last several years.  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  She has never been evaluated by a pulmonologist and she is not on medical therapy for sarcoidosis.  Findings from her transthoracic and transesophageal echocardiograms suggest that her mitral valve disease is caused by inflammatory disease such as rheumatic heart disease and/or sarcoidosis.  There is moderate thickening with mild to moderate restricted mobility of the anterior leaf of the mitral valve with severely restricted mobility of the posterior leaflet.  There is an eccentric jet of regurgitation  which courses anteriorly around the left atrium.  Although the mitral regurgitation was not directly quantified, it appears moderate in severity.  The annuloplasty ring appears intact.  By planimetry the valve area is less than 1.5 cm and mean transvalvular gradients have ranged between 8 and 15 mmHg on previous echocardiograms.  Patient also has significant right ventricular chamber enlargement with moderate  tricuspid regurgitation.  Options at this point include continued medical therapy versus redo mitral valve repair or replacement with or without tricuspid valve repair.  Based upon review of the patient's TEE I am not very optimistic that her mitral valve could be repaired a second time and yield a satisfactory long-term result.    Given her relatively young age, surgical intervention would likely mandate mitral valve replacement using a mechanical prosthetic valve.  It remains unclear whether or not this patient would be able to manage long-term anticoagulation using warfarin.     Plan:  The patient was counseled at length regarding the indications, risks and potential benefits of redo mitral valve repair or replacement with or without tricuspid valve repair.  The rationale for elective surgery has been explained, including a comparison between surgery and continued medical therapy with close follow-up.  We discussed the fact that her valvular heart disease is not imminently life-threatening, and continued long-term medical therapy remains a reasonable alternative.  We discussed the fact that her mitral valve would likely need to be replaced.  We discussed the possibility of replacing the mitral valve using a mechanical prosthesis with the attendant need for long-term anticoagulation versus the alternative of replacing it using a bioprosthetic tissue valve with its potential for late structural valve deterioration and failure.  We discussed implications of long-term warfarin anticoagulation in the  setting of a mechanical prosthetic valve in the mitral position.  The patient remains interested in considering elective surgical intervention in the near future, although she wishes to think things over before making a final decision.  As a neck step we will ask for the patient to be evaluated by the pulmonary medicine team to consider whether or not medical therapy for the treatment of underlying sarcoidosis would be appropriate.  We will have the pharmacy team provide educational materials for the patient to review regarding long-term warfarin anticoagulation.  Once the patient is felt to be medically stable she can be discharged from the hospital.  We will make arrangements for follow-up office visit within the next 2 to 3 weeks to discuss options further and possibly schedule surgery.  All of her questions have been addressed.  I spent in excess of 90 minutes during the conduct of this hospital consultation and >50% of this time involved direct face-to-face encounter for counseling and/or coordination of the patient's care.    Salvatore Decent. Cornelius Moras, MD 03/07/2019 5:38 AM

## 2019-03-07 NOTE — Progress Notes (Signed)
Pharmacy note: coumadin education   38 yo female with history of MV repair and plans for valve replacement. Pharmacy consulted to discuss with patient and provide education.    I discussed indication, INR goal and monitoring, risk and signs of bleeding and consistent vitamin K intake.  Patient verbalized understanding and feels that she would be compliant with coumadin  Hildred Laser, PharmD Clinical Pharmacist **Pharmacist phone directory can now be found on [email or web address] ([email or web address]).  Listed under [place].

## 2019-03-07 NOTE — Consult Note (Signed)
NAME:  Theresa Crawford, MRN:  [id number], DOB:  [birth year], LOS: 2 ADMISSION DATE:  03/03/2019, CONSULTATION DATE: 03/07/2019 REFERRING MD: Dr. Cornelius Moraswen, CHIEF COMPLAINT: History of sarcoidosis  Brief History   38 year old with a past medical history of sarcoidosis, recurrent mitral stenosis, regurg status post repair and moderate pulmonary hypertension presenting with evidence of heart failure.  Pulmonary was consulted for recommendations regarding history of sarcoidosis  History of present illness   38 year old female past medical history of anxiety, depression, hypertension, sarcoidosis (diagnosed by imaging, no prior biopsy per patient), mitral valve disease, mitral stenosis/regurgitation status [facility] [place] in [place].  [place] [facility] [place].  She is followed here by Dr. Purvis SheffieldKoneswaran.  Patient presented to the hospital with a several day history of progressive shortness of breath.  She had an echocardiogram consistent with a reduced ejection fraction of 45 to 50%, mild mitral stenosis.  Patient had a right and left heart catheterization with nonobstructive CAD.  She was seen by cardiothoracic surgery which is evaluating for mitral valve repair.  Please see note from cardiothoracic surgery regarding prior surgical history.  As for her sarcoidosis history.  The patient states that she was seen by pulmonologist in [place] in 2015.  During this time she had a CAT scan image of the chest which revealed image characteristics consistent with sarcoid.  She states that she has never had a biopsy.  She does state that her aunt and her grandmother had sarcoidosis.  As well as her father having a diagnosis of sarcoidosis.  She did mention that her father had cardiomyopathy and was on an LVAD at one point.  Unfortunately he is deceased at this time.  She is not sure if he had sarcoid cardiac involvement or not.  Additional discussions with the patient revealed that she  has had chronic dry eye.  As well as enlargement of her lacrimal glands.  She also endorses a history of a recurrent migraine rash within the lower extremities.  This is also seemingly consistent with erythema nodosum.  CT imaging upon admission to the hospital this time reveals evidence of anterior pericardial lymphadenopathy as well as mediastinal adenopathy.  There is calcification within the mediastinum consistent with old granulomatous disease.  Fortunately she did quit smoking nearly a month ago.  She does not have any occupational exposures consistent with dust or metal fume exposure.  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  Past Medical History   Past Medical History:  Diagnosis Date  . Anxiety   . Arthritis    "lower back" (04/04/2018)  . Chronic bronchitis (HCC)   . Chronic diastolic CHF (congestive heart failure) (HCC)   . Chronic lower back pain   . DDD (degenerative disc disease), lumbar   . Depression   . GERD (gastroesophageal reflux disease)   . Headache    "weekly" (04/04/2018)  . Heart murmur   . Hypertension   . Mitral valve disease   . Normocytic anemia   . Obesity   . Palpitations   . Pericarditis   . Premature atrial contractions   . Pulmonary hypertension (HCC)   . PVC's (premature ventricular contractions)    a. h/o palpitations with event monitor in 03/2017 showing NSR with PACs/PVCs.  . Recurrent mitral valve stenosis and regurgitation s/p mitral valve repair   . S/P mitral valve repair 03/27/2013   Dr. Darcus AustinMark Burlingame [place] - complex valvuloplasty including resuspension of entire posterior leaflet using Gore-tex  neochords and 30 mm Edwards Physio ring annuloplasty  . Sarcoidosis   . Tobacco abuse   . Tricuspid regurgitation      Significant Hospital Events     Consults:  Cardiology Cardiothoracic surgery  Procedures:   LHC 03/05/2019  Prox RCA to Mid RCA lesion is 10% stenosed.  Prox Cx to Mid Cx lesion is 20% stenosed.  Mid LAD lesion is 10%  stenosed. 1. Mild non-obstructive CAD   Significant Diagnostic Tests:    ECHO TEE:  1. The left ventricle has mildly reduced systolic function, with an ejection fraction of 45-50%. The cavity size was normal.  2. The right ventricle has mildly reduced systolic function. The cavity was mildly enlarged.  3. LA, LAA without masses.  4. S/P Mitral valve repair. 2D and 3D imaging done. The mitral leaflet is thickened with restricted motion, particularly the posterior leaflet. Peak and mean gradients through the valve are 13 and 8 mm Hg respectively. MVA by Pressure T1/2 is 1.85 cm2  consistent with mild MS 2D imaging suggests more moderate.  5. Tricuspid valve regurgitation is mild-moderate.  6. The aortic valve is tricuspid Aortic valve regurgitation is trivial by color flow Doppler.  Micro Data:  None  Antimicrobials:  None   Interim history/subjective:  See above   Objective   Blood pressure (!) 119/94, pulse 98, temperature 98.7 F (37.1 C), temperature source Oral, resp. rate 20, height 5\' 4"  (1.626 m), weight 100.9 kg, last menstrual period 02/18/2019, SpO2 96 %.        Intake/Output Summary (Last 24 hours) at 03/07/2019 1610 Last data filed at 03/07/2019 1019 Gross per 24 hour  Intake 480 ml  Output -  Net 480 ml   Filed Weights   03/05/19 0506 03/06/19 0500 03/07/19 0500  Weight: 96.9 kg 97.5 kg 100.9 kg    Examination: General: Young female, resting in bed no apparent distress HENT: NCAT, sclera clear, bilateral lacrimal hyperplasia/enlargement Lungs: Bibasilar crackles on inspiration, no rhonchi, no wheeze Cardiovascular: The rate and rhythm, S1-S2, heart tones are distant, no overt murmur auscultated Abdomen: Soft, nontender, nondistended, obese pannus Extremities: No significant lower extremity edema, no rash Neuro: Awake alert, following commands no focal deficit GU: Deferred  Resolved Hospital Problem list     Assessment & Plan:   Probable multisystem  sarcoid involvement.  Patient has not had biopsy-proven tissue to our knowledge. Bilateral mediastinal hilar adenopathy, pericardial adenopathy Bilateral lacrimal gland hyperplasia, chronic dry eye  - she gives a history that is consistent with lacrimal gland involvement History of skin involvement, descriptions of periodic lesions on the lower extremities per patient are likely erythema nodosum.  She does not have any of these lesions active at this time. Possible heart involvement per echocardiographic results and concern by cardiothoracic surgery. - Overall her diagnosis is likely consistent with systemic sarcoid - She will likely benefit long-term from treatment of her sarcoid to include systemic steroids.  However due to the plan possibility of mitral valve repair would prefer to hold off on systemic immunosuppression until she has had good recovery from surgery and her sternotomy wound is healed. - If she is going under cardiac surgery biopsying her mediastinal adenopathy to send for pathology would be very beneficial to establish diagnosis. -She will need outpatient ophthalmologic consultation for evaluation of her dry eye and lacrimal hyperplasia - Due to her depressed EF a functional cardiac MRI to look for late gadolinium enhancement may be beneficial for potential diagnosis of cardiac involvement. -Due  to the amount of pulmonary vascular congestion and edema on her CT imaging I am unable to determine the extent of any parenchymal involvement of her sarcoidosis within the lung. -Pulmonary function tests in the setting of pulmonary edema and heart failure will also not give us reliable results for any evidence of restriction. -We appreciate the consultation.  Please call with any questions.  Will follow along with you.   Labs   CBC: Recent Labs  Lab 03/03/19 1914 03/04/19 0728 03/05/19 0803 03/05/19 1534 03/05/19 1615 03/06/19 0409  WBC 7.4 6.7 6.8  --   --  8.4  HGB 11.0* 10.8*  11.4* 14.3 13.3 12.3  HCT 37.2 36.2 37.1 42.0 39.0 39.9  MCV 80.5 82.8 78.8*  --   --  79.2*  PLT 344 338 386  --   --  460*    Basic Metabolic Panel: Recent Labs  Lab 03/03/19 1914 03/04/19 0728 03/05/19 0803 03/05/19 1534 03/05/19 1615 03/06/19 0409  NA 137 134* 138 143 143 135  K 3.8 3.3* 4.2 4.6 4.5 4.8  CL 108 104 108  --   --  104  CO2 22 24 22   --   --  20*  GLUCOSE 102* 82 91  --   --  166*  BUN 12 12 16   --   --  20  CREATININE 0.83 1.04* 1.04*  --   --  1.06*  CALCIUM 8.7* 8.4* 8.8*  --   --  9.5  MG 2.2  --  2.1  --   --   --    GFR: Estimated Creatinine Clearance: 82.3 mL/min (A) (by C-G formula based on SCr of 1.06 mg/dL (H)). Recent Labs  Lab 03/03/19 1914 03/04/19 0728 03/05/19 0803 03/06/19 0409  WBC 7.4 6.7 6.8 8.4    Liver Function Tests: Recent Labs  Lab 03/04/19 0728  AST 18  ALT 11  ALKPHOS 61  BILITOT 0.7  PROT 7.3  ALBUMIN 3.5   No results for input(s): LIPASE, AMYLASE in the last 168 hours. No results for input(s): AMMONIA in the last 168 hours.  ABG    Component Value Date/Time   PHART 7.380 03/07/2019 0640   PCO2ART 38.4 03/07/2019 0640   PO2ART 78.9 (L) 03/07/2019 0640   HCO3 22.2 03/07/2019 0640   TCO2 20 (L) 03/05/2019 1615   ACIDBASEDEF 2.1 (H) 03/07/2019 0640   O2SAT 95.6 03/07/2019 0640     Coagulation Profile: No results for input(s): INR, PROTIME in the last 168 hours.  Cardiac Enzymes: Recent Labs  Lab 03/04/19 0229 03/04/19 0728  TROPONINI <0.03 <0.03    HbA1C: No results found for: HGBA1C  CBG: No results for input(s): GLUCAP in the last 168 hours.  Review of Systems:   Review of Systems  Constitutional: Negative for chills, fever, malaise/fatigue and weight loss.  HENT: Negative for hearing loss, sore throat and tinnitus.   Eyes: Positive for redness. Negative for blurred vision and double vision.  Respiratory: Positive for shortness of breath. Negative for cough, hemoptysis, sputum production,  wheezing and stridor.   Cardiovascular: Negative for chest pain, palpitations, orthopnea, leg swelling and PND.  Gastrointestinal: Negative for abdominal pain, constipation, diarrhea, heartburn, nausea and vomiting.  Genitourinary: Negative for dysuria, hematuria and urgency.  Musculoskeletal: Positive for joint pain. Negative for myalgias.  Skin: Negative for itching and rash.  Neurological: Negative for dizziness, tingling, weakness and headaches.  Endo/Heme/Allergies: Negative for environmental allergies. Does not bruise/bleed easily.  Psychiatric/Behavioral: Negative for depression. The  patient is not nervous/anxious and does not have insomnia.   All other systems reviewed and are negative.    Past Medical History  She,  has a past medical history of Anxiety, Arthritis, Chronic bronchitis (HCC), Chronic diastolic CHF (congestive heart failure) (HCC), Chronic lower back pain, DDD (degenerative disc disease), lumbar, Depression, GERD (gastroesophageal reflux disease), Headache, Heart murmur, Hypertension, Mitral valve disease, Normocytic anemia, Obesity, Palpitations, Pericarditis, Premature atrial contractions, Pulmonary hypertension (HCC), PVC's (premature ventricular contractions), Recurrent mitral valve stenosis and regurgitation s/p mitral valve repair, S/P mitral valve repair (03/27/2013), Sarcoidosis, Tobacco abuse, and Tricuspid regurgitation.   Surgical History    Past Surgical History:  Procedure Laterality Date  . ABDOMINAL HERNIA REPAIR  ~ 2011  . ABDOMINAL HYSTERECTOMY  2011   "partial"; PID w/problem with fallopian tubes, had left fallopian and left ovary removed, pt still having periods  . CARDIAC CATHETERIZATION  2014  . CESAREAN SECTION  2004; 2009  . HERNIA REPAIR    . MITRAL VALVE REPAIR  03/27/2013   Dr. Darcus AustinMark [place]lingame [place] - complex valvuloplasty including resuspension of posterior leaflet with 30 mm CE Physio ring annuloplasty  . MULTIPLE EXTRACTIONS  WITH ALVEOLOPLASTY N/A 04/03/2018   Procedure: Extraction of tooth #30 with alveoloplasty and gross debridement of remaining teeth;  Surgeon: Charlynne PanderKulinski, Ronald F, DDS;  [facility];  Service: Oral Surgery;  Laterality: N/A;  . PARTIAL HYSTERECTOMY  2011   PID w/problem with fallopian tubes, had left fallopian and left ovary removed, pt still having periods  . RIGHT/LEFT HEART CATH AND CORONARY ANGIOGRAPHY N/A 03/05/2019   Procedure: RIGHT/LEFT HEART CATH AND CORONARY ANGIOGRAPHY;  Surgeon: Kathleene HazelMcAlhany, Christopher D, MD;  Location: [facility] INVASIVE CV LAB;  Service: Cardiovascular;  Laterality: N/A;  . TEE WITHOUT CARDIOVERSION N/A 05/26/2016   Procedure: TRANSESOPHAGEAL ECHOCARDIOGRAM (TEE);  Surgeon: Laqueta LindenSuresh A Koneswaran, MD;  Location: [facility] END[facility] SUITE;  Service: Cardiovascular;  Laterality: N/A;  . TEE WITHOUT CARDIOVERSION N/A 01/25/2018   Procedure: TRANSESOPHAGEAL ECHOCARDIOGRAM (TEE) WITH PROPOFOL;  Surgeon: Jonelle SidleMcDowell, Samuel G, MD;  Location: [facility] SUITE;  Service: Cardiovascular;  Laterality: N/A;     Social History   reports that she has been smoking cigarettes. She started smoking about 19 years ago. She has a 6.00 pack-year smoking history. She has never used smokeless tobacco. She reports current alcohol use. She reports previous drug use.   Family History   Her family history includes Heart disease in her paternal grandfather; Heart failure in her father.   Allergies Allergies  Allergen Reactions  . Bee Venom Anaphylaxis  . Lisinopril Shortness Of Breath, Swelling and Other (See Comments)    Throat swelling  . Penicillins Shortness Of Breath, Swelling and Other (See Comments)    Has patient had a PCN reaction causing immediate rash, facial/tongue/throat swelling, SOB or lightheadedness with hypotension: Yes Has patient had a PCN reaction causing severe rash involving mucus membranes or skin necrosis: Yes Has patient had a PCN reaction that required hospitalization No Has patient had a  PCN reaction occurring within the last 10 years: No If all of the above answers are "NO", then may proceed with Cephalosporin use.   Marland Kitchen. Perflutren Lipid Microsphere Shortness Of Breath and Other (See Comments)    Other reaction(s): Chest Pain/Tightness  . Shellfish Allergy Anaphylaxis  . Contrast Media [Iodinated Diagnostic Agents] Hives, Itching and Other (See Comments)    Itching and hives to throat, neck, face and arms.   No difficulty breathing.   This was  given at [facility] approximately 2015. Contrast Dye     Home Medications  Prior to Admission medications   Medication Sig Start Date End Date Taking? Authorizing Provider  bumetanide (BUMEX) 1 MG tablet Take 2 tablets (2 mg total) by mouth 2 (two) times daily. 02/28/19  Yes [name], MD  docusate sodium (COLACE) 100 MG capsule Take 100 mg by mouth daily as needed for moderate constipation.    Yes [provider]  EPINEPHrine (EPIPEN 2-PAK) 0.3 mg/0.3 mL IJ SOAJ injection Inject 0.3 Units into the muscle once.    Yes [provider]  ferrous sulfate 325 (65 FE) MG tablet Take 1 tablet (325 mg total) by mouth 2 (two) times daily with a meal. Patient taking differently: Take 325 mg by mouth daily with breakfast.  03/08/18  Yes Philip Aspen, Limmie Patricia, MD  folic acid (FOLVITE) 800 MCG tablet Take 400 mcg by mouth daily.   Yes [provider]  LORazepam (ATIVAN) 0.5 MG tablet Take 1 tablet (0.5 mg total) by mouth every 6 (six) hours as needed for anxiety or sleep. 04/15/18  Yes Oneta Rack, NP  metoprolol succinate (TOPROL-XL) 25 MG 24 hr tablet Take 1.5 tablets (37.5 mg total) by mouth daily. 02/28/19  Yes [name], MD  potassium chloride SA (K-DUR) 20 MEQ tablet Take 2 tablets (40 mEq total) by mouth daily. Take 2 tablets daily 02/28/19  Yes [name], MD  Probiotic Product (PROBIOTIC PO) Take 1 capsule by mouth daily.   Yes [provider]  QUEtiapine  (SEROQUEL) 50 MG tablet Take 1 tablet (50 mg total) by mouth at bedtime. 04/15/18  Yes Oneta Rack, NP  sertraline (ZOLOFT) 100 MG tablet Take 1 tablet (100 mg total) by mouth daily. 04/16/18  Yes Oneta Rack, NP  traZODone (DESYREL) 50 MG tablet Take 0.5 tablets (25 mg total) by mouth at bedtime. Patient taking differently: Take 25-50 mg by mouth at bedtime.  04/15/18  Yes Oneta Rack, NP  vitamin B-12 (CYANOCOBALAMIN) 1000 MCG tablet Take 1,000 mcg by mouth daily.   Yes [provider]  albuterol (PROVENTIL HFA;VENTOLIN HFA) 108 (90 Base) MCG/ACT inhaler Inhale 2 puffs into the lungs every 6 (six) hours as needed for wheezing or shortness of breath.    [provider]     Josephine Igo, DO [facility] 03/07/2019 4:37 PM  Personal pager: [phone] If unanswered, please page CCM On-call: #[phone]

## 2019-03-07 NOTE — Care Management (Signed)
Spoke w patient at the bedside. She states that she is form home, lives w her mom. She states that she has 2 kids but they do not live with her. She confirms that she does not have a PCP, requested CMA to schedu;e at [facility] as she needs help with affording medications. Currently she gets her meds through [employer] and pays cash. She states that she does not always have enough money to pay for meds. She does not drive, and relys on church members for transportation. Would benefit from [facility] at [place].

## 2019-03-07 NOTE — Progress Notes (Signed)
PROGRESS NOTE  Theresa Crawford VCB:[id number] DOB: 1980-11-30 DOA: 03/03/2019 PCP: Marline Backbone, [name]   LOS: 2 days   Patient is from: Home  Brief Narrative / Interim history: 38 year old female with history of anxiety, depression, sarcoidosis, hypertension, recurrent mitral stenosis/regurgitation status post repair, moderate pulmonary hypertension presenting with dyspnea and orthopnea.  Chest x-ray with cardiomegaly and pulmonary vascular congestion concerning for CHF exacerbation.  COVID-19 negative.  CBC and BMP not impressive.  Started on IV Lasix and admitted for CHF exacerbation. Cardiology consulted.  Patient had TEE and [facility] for mitral valve evaluation.  TEE with EF of 45-50% with mild mitral stenosis.  R/LHC with mild nonobstructive CAD.  Cardiothoracic surgery consulted by cardiology.   Assessment & Plan: Acute diastolic CHF/severe pulmonary hypertension: chest x-ray concerning.   -TTE with EF of 60 to 65%, impaired diastolic relaxation, RVSP to 88.1 mmHg, mod LAE, mild edematous mitral valve, severe mitral stenosis and moderate TR.  -TEE with EF of 45 to 50%, mild mitral stenosis  -R/LHC with mild nonobstructive CAD  -I&O not captured in epic.  Cardiopulmonary symptoms improved. -Cardiology following.  -Transitioned to oral Lasix 40 mg daily on 6/23.  -Continue home metoprolol. -Monitor daily weight, intake output and renal function -Closely monitor electrolytes and replenish aggressively  History of mitral valve stenosis/regurg status post mitral valve repair -TTE and TEE findings as above. -Cardiothoracic surgery consulted by cardiology  Anxiety/depression: Improved -Changed Ativan to Klonopin-longer half-life and less withdrawal symptoms. -Added buspirone 7.5 mg 3 times daily -Continue home Zoloft, trazodone and Seroquel.  Next iron deficiency anemia/anemia of chronic disease: Stable .  Hemoglobin at baseline.  Iron saturation 9%. -We will give Feraheme. -Continue  ferrous sulfate  History of asthma: Stable -Breathing treatments  Scheduled Meds: . busPIRone  7.5 mg Oral TID  . clonazePAM  0.5 mg Oral BID  . enoxaparin (LOVENOX) injection  0.5 mg/kg Subcutaneous Q24H  . furosemide  40 mg Oral Daily  . metoprolol succinate  37.5 mg Oral Daily  . potassium chloride SA  10 mEq Oral Daily  . QUEtiapine  50 mg Oral QHS  . saccharomyces boulardii  250 mg Oral Daily  . sertraline  100 mg Oral Daily  . sodium chloride flush  3 mL Intravenous Q12H  . traZODone  50 mg Oral QHS   Continuous Infusions: . sodium chloride    . ferumoxytol     PRN Meds:.sodium chloride, albuterol, docusate sodium, sodium chloride flush   DVT prophylaxis: Subcu Lovenox Code Status: Full code Family Communication: Patient update family let me know if there is any question Disposition Plan: Discharge home pending clearance by cardiothoracic surgery  Subjective: No major events overnight of this morning.  Anxiety better today.  Denies chest pain, dyspnea, orthopnea, palpitation, GU or GI symptoms.   Objective: Vitals:   03/07/19 0500 03/07/19 0611 03/07/19 0832 03/07/19 1249  BP:  92/68  (!) 119/94  Pulse:  76 85 98  Resp:  20  20  Temp:  98.7 F (37.1 C)    TempSrc:  Oral    SpO2:  93%  96%  Weight: 100.9 kg     Height:        Intake/Output Summary (Last 24 hours) at 03/07/2019 1347 Last data filed at 03/07/2019 1019 Gross per 24 hour  Intake 480 ml  Output -  Net 480 ml   Filed Weights   03/05/19 0506 03/06/19 0500 03/07/19 0500  Weight: 96.9 kg 97.5 kg 100.9 kg  Examination:  GENERAL: No acute distress.  Appears well.  HEENT: MMM.  Vision and hearing grossly intact.  NECK: Supple.  No JVD.  LUNGS:  No IWOB. Good air movement bilaterally. HEART:  RRR. Heart sounds normal.  ABD: Bowel sounds present. Soft. Non tender.  MSK/EXT:  Moves all extremities. No apparent deformity. No edema bilaterally.  SKIN: no apparent skin lesion or wound NEURO:  Awake, alert and oriented appropriately.  No gross deficit.  PSYCH: Calm. Normal affect.  Consultants:   Cardiology  Procedures:   R/LHC on 6/22-mild nonobstructive CAD  Microbiology: . COVID-19 negative  Antimicrobials: Anti-infectives (From admission, onward)   None      Data Reviewed: I have independently reviewed following labs and imaging studies  CBC: Recent Labs  Lab 03/03/19 1914 03/04/19 0728 03/05/19 0803 03/05/19 1534 03/05/19 1615 03/06/19 0409  WBC 7.4 6.7 6.8  --   --  8.4  HGB 11.0* 10.8* 11.4* 14.3 13.3 12.3  HCT 37.2 36.2 37.1 42.0 39.0 39.9  MCV 80.5 82.8 78.8*  --   --  79.2*  PLT 344 338 386  --   --  460*   Basic Metabolic Panel: Recent Labs  Lab 03/03/19 1914 03/04/19 0728 03/05/19 0803 03/05/19 1534 03/05/19 1615 03/06/19 0409  NA 137 134* 138 143 143 135  K 3.8 3.3* 4.2 4.6 4.5 4.8  CL 108 104 108  --   --  104  CO2 22 24 22   --   --  20*  GLUCOSE 102* 82 91  --   --  166*  BUN 12 12 16   --   --  20  CREATININE 0.83 1.04* 1.04*  --   --  1.06*  CALCIUM 8.7* 8.4* 8.8*  --   --  9.5  MG 2.2  --  2.1  --   --   --    GFR: Estimated Creatinine Clearance: 82.3 mL/min (A) (by C-G formula based on SCr of 1.06 mg/dL (H)). Liver Function Tests: Recent Labs  Lab 03/04/19 0728  AST 18  ALT 11  ALKPHOS 61  BILITOT 0.7  PROT 7.3  ALBUMIN 3.5   No results for input(s): LIPASE, AMYLASE in the last 168 hours. No results for input(s): AMMONIA in the last 168 hours. Coagulation Profile: No results for input(s): INR, PROTIME in the last 168 hours. Cardiac Enzymes: Recent Labs  Lab 03/04/19 0229 03/04/19 0728  TROPONINI <0.03 <0.03   BNP (last 3 results) No results for input(s): PROBNP in the last 8760 hours. HbA1C: No results for input(s): HGBA1C in the last 72 hours. CBG: No results for input(s): GLUCAP in the last 168 hours. Lipid Profile: No results for input(s): CHOL, HDL, LDLCALC, TRIG, CHOLHDL, LDLDIRECT in the last 72  hours. Thyroid Function Tests: No results for input(s): TSH, T4TOTAL, FREET4, T3FREE, THYROIDAB in the last 72 hours. Anemia Panel: Recent Labs    03/06/19 1630  VITAMINB12 376  FOLATE 13.6  FERRITIN 71  TIBC 381  IRON 36  RETICCTPCT 2.7   Urine analysis:    Component Value Date/Time   COLORURINE YELLOW 07/09/2018 1723   APPEARANCEUR CLEAR 07/09/2018 1723   LABSPEC 1.024 07/09/2018 1723   PHURINE 6.0 07/09/2018 1723   GLUCOSEU NEGATIVE 07/09/2018 1723   HGBUR LARGE (A) 07/09/2018 1723   BILIRUBINUR NEGATIVE 07/09/2018 1723   KETONESUR NEGATIVE 07/09/2018 1723   PROTEINUR 30 (A) 07/09/2018 1723   NITRITE NEGATIVE 07/09/2018 1723   LEUKOCYTESUR SMALL (A) 07/09/2018 1723   Sepsis  Labs: Invalid input(s): PROCALCITONIN, LACTICIDVEN  Recent Results (from the past 240 hour(s))  SARS Coronavirus 2 (CEPHEID- Performed in [facility] hospital lab), Hosp Order     Status: None   Collection Time: 03/03/19  7:14 PM   Specimen: Nasopharyngeal Swab  Result Value Ref Range Status   SARS Coronavirus 2 NEGATIVE NEGATIVE Final    Comment: (NOTE) If result is NEGATIVE SARS-CoV-2 target nucleic acids are NOT DETECTED. The SARS-CoV-2 RNA is generally detectable in upper and lower  respiratory specimens during the acute phase of infection. The lowest  concentration of SARS-CoV-2 viral copies this assay can detect is 250  copies / mL. A negative result does not preclude SARS-CoV-2 infection  and should not be used as the sole basis for treatment or other  patient management decisions.  A negative result may occur with  improper specimen collection / handling, submission of specimen other  than nasopharyngeal swab, presence of viral mutation(s) within the  areas targeted by this assay, and inadequate number of viral copies  (<250 copies / mL). A negative result must be combined with clinical  observations, patient history, and epidemiological information. If result is POSITIVE SARS-CoV-2  target nucleic acids are DETECTED. The SARS-CoV-2 RNA is generally detectable in upper and lower  respiratory specimens dur ing the acute phase of infection.  Positive  results are indicative of active infection with SARS-CoV-2.  Clinical  correlation with patient history and other diagnostic information is  necessary to determine patient infection status.  Positive results do  not rule out bacterial infection or co-infection with other viruses. If result is PRESUMPTIVE POSTIVE SARS-CoV-2 nucleic acids MAY BE PRESENT.   A presumptive positive result was obtained on the submitted specimen  and confirmed on repeat testing.  While 2019 novel coronavirus  (SARS-CoV-2) nucleic acids may be present in the submitted sample  additional confirmatory testing may be necessary for epidemiological  and / or clinical management purposes  to differentiate between  SARS-CoV-2 and other Sarbecovirus currently known to infect humans.  If clinically indicated additional testing with an alternate test  methodology [phone]([id number]) is advised. The SARS-CoV-2 RNA is generally  detectable in upper and lower respiratory sp ecimens during the acute  phase of infection. The expected result is Negative. Fact Sheet for Patients:  BoilerBrush.com.cyhttps://www.fda.gov/media/136312/download Fact Sheet for Healthcare Providers: https://pope.com/https://www.fda.gov/media/136313/download This test is not yet approved or cleared by the [place] FDA and has been authorized for detection and/or diagnosis of SARS-CoV-2 by FDA under an Emergency Use Authorization (EUA).  This EUA will remain in effect (meaning this test can be used) for the duration of the COVID-19 declaration under Section 564(b)(1) of the Act, 21 U.S.C. section 360bbb-3(b)(1), unless the authorization is terminated or revoked sooner. Performed at [facility], [address] [place], [place] [address]      Radiology Studies: No results found.   Kyle Luppino T. Olga Bourbeau [facility]   If 7PM-7AM, please contact night-coverage [email or web address] Password [email or web address] 03/07/2019, 1:47 PM

## 2019-03-07 NOTE — Significant Event (Signed)
Rapid Response Event Note  Overview: Vagal ? Second Set of Eyes   Initial Focused Assessment: Called by RTs from the PFT lab, per RTs - patient had completed the PFTS but became weak, "loss the color to her face" and was drowsy. When I arrived, patient was sitting upright in the PFT chair, she was awake, endorsed that she felt dizzy and lightheaded. RT placed a pulse - ox on her - HR 95 and 96% on RA.   We encouraged the patient to sit for a few minutes. When I asked why she was in the hospital, she quickly became emotional and started to cry. We talked about her possibly needing another heart surgery to fix/replace her valves. I encouraged to keep talking to the staff, assured her that we would support her, and that we would take care of her. When she tried to stand up, she was dizzy and after a few attempts she was able to sit in wheelchair and we returned to her room.   Once in her room, with assistance she sat on the side of the bed. BP was 119/94 (104) HR 98 96% RA, breathing was normal and no acute distress noted. Once she was in bed, she stated she was back to her baseline.   Interventions: -- No RRT Interventions   Plan of Care: -- Orthostatic VS if patient can tolerate  -- Monitor VS  Event Summary:  Call Time 1227 Arrival Time 1229  End Time 1300  Edgar Reisz R

## 2019-03-07 NOTE — Progress Notes (Addendum)
Progress Note  Patient Name: Theresa DaneLatasha Y Crawford Date of Encounter: 03/07/2019  Primary Cardiologist: Prentice DockerSuresh Koneswaran, MD   Subjective   Pt doing okay today. Reports breathing improved. Urinating a lot. Denies chest pain.   Inpatient Medications    Scheduled Meds: . busPIRone  7.5 mg Oral TID  . clonazePAM  0.5 mg Oral BID  . enoxaparin (LOVENOX) injection  0.5 mg/kg Subcutaneous Q24H  . furosemide  40 mg Oral Daily  . metoprolol succinate  37.5 mg Oral Daily  . potassium chloride SA  10 mEq Oral Daily  . QUEtiapine  50 mg Oral QHS  . saccharomyces boulardii  250 mg Oral Daily  . sertraline  100 mg Oral Daily  . sodium chloride flush  3 mL Intravenous Q12H  . traZODone  50 mg Oral QHS   Continuous Infusions: . sodium chloride     PRN Meds: sodium chloride, albuterol, docusate sodium, sodium chloride flush   Vital Signs    Vitals:   03/06/19 1700 03/06/19 2010 03/07/19 0500 03/07/19 0611  BP:  99/60  92/68  Pulse:  80  76  Resp:  16  20  Temp: 97.6 F (36.4 C) 98 F (36.7 C)  98.7 F (37.1 C)  TempSrc:  Oral  Oral  SpO2:  92%  93%  Weight:   100.9 kg   Height:        Intake/Output Summary (Last 24 hours) at 03/07/2019 0647 Last data filed at 03/06/2019 2019 Gross per 24 hour  Intake 960 ml  Output -  Net 960 ml   Filed Weights   03/05/19 0506 03/06/19 0500 03/07/19 0500  Weight: 96.9 kg 97.5 kg 100.9 kg   Physical Exam   General: Overweight, NAD Skin: Warm, dry, intact  Neck: Negative for carotid bruits. No JVD Lungs:Clear to ausculation bilaterally. No wheezes, rales, or rhonchi. Breathing is unlabored. Cardiovascular: RRRwith S1 S2. + murmurs. Extremities: No edema. No clubbing or cyanosis. DP/PT pulses 2+ bilaterally Neuro: Alert and oriented. No focal deficits. No facial asymmetry. MAE spontaneously. Psych: Responds to questions appropriately with normal affect.    Labs    Chemistry Recent Labs  Lab 03/04/19 0728 03/05/19 0803 03/05/19  1534 03/05/19 1615 03/06/19 0409  NA 134* 138 143 143 135  K 3.3* 4.2 4.6 4.5 4.8  CL 104 108  --   --  104  CO2 24 22  --   --  20*  GLUCOSE 82 91  --   --  166*  BUN 12 16  --   --  20  CREATININE 1.04* 1.04*  --   --  1.06*  CALCIUM 8.4* 8.8*  --   --  9.5  PROT 7.3  --   --   --   --   ALBUMIN 3.5  --   --   --   --   AST 18  --   --   --   --   ALT 11  --   --   --   --   ALKPHOS 61  --   --   --   --   BILITOT 0.7  --   --   --   --   GFRNONAA >60 >60  --   --  >60  GFRAA >60 >60  --   --  >60  ANIONGAP 6 8  --   --  11     Hematology Recent Labs  Lab 03/04/19 0728 03/05/19  [id number] 03/05/19 1534 03/05/19 1615 03/06/19 0409 03/06/19 1630  WBC 6.7 6.8  --   --  8.4  --   RBC 4.37 4.71  --   --  5.04 5.06  HGB 10.8* 11.4* 14.3 13.3 12.3  --   HCT 36.2 37.1 42.0 39.0 39.9  --   MCV 82.8 78.8*  --   --  79.2*  --   MCH 24.7* 24.2*  --   --  24.4*  --   MCHC 29.8* 30.7  --   --  30.8  --   RDW 20.9* 20.7*  --   --  20.8*  --   PLT 338 386  --   --  460*  --     Cardiac Enzymes Recent Labs  Lab 03/04/19 0229 03/04/19 0728  TROPONINI <0.03 <0.03   No results for input(s): TROPIPOC in the last 168 hours.   BNP Recent Labs  Lab 03/03/19 1914  BNP 528.0*     DDimer No results for input(s): DDIMER in the last 168 hours.   Radiology    No results found.  Telemetry    03/07/2019 SR, HR in the 80-80 range- Personally Reviewed  ECG    03/05/2019 NSR with HR 98bpm, Qtc 468 and no acute ischemic changes - Personally Reviewed  Cardiac Studies   RIGHT/LEFT HEART CATH AND CORONARY ANGIOGRAPHY 03/05/2019  Conclusion    Prox RCA to Mid RCA lesion is 10% stenosed.  Prox Cx to Mid Cx lesion is 20% stenosed.  Mid LAD lesion is 10% stenosed.  1. Mild non-obstructive CAD    TEE 03/05/2019 1. The left ventricle has mildly reduced systolic function, with an ejection fraction of 45-50%. The cavity size was normal. 2. The right ventricle has mildly  reduced systolic function. The cavity was mildly enlarged. 3. LA, LAA without masses. 4. S/P Mitral valve repair. 2D and 3D imaging done. The mitral leaflet is thickened with restricted motion, particularly the posterior leaflet. Peak and mean gradients through the valve are 13 and 8 mm Hg respectively. MVA by Pressure T1/2 is 1.85 cm2  consistent with mild MS 2D imaging suggests more moderate. 5. Tricuspid valve regurgitation is mild-moderate. 6. The aortic valve is tricuspid Aortic valve regurgitation is trivial by color flow Doppler.  ECHO 03/04/19: 1. The left ventricle has normal systolic function with an ejection fraction of 60-65%. The cavity size was normal. Left ventricular diastolic Doppler parameters are consistent with pseudonormalization. There is right ventricular volume and pressure  overload. No evidence of left ventricular regional wall motion abnormalities. 2. The right ventricle has normal systolic function. The cavity was moderately enlarged. There is no increase in right ventricular wall thickness. Right ventricular systolic pressure is severely elevated with an estimated pressure of 88.1 mmHg. 3. Left atrial size was moderately dilated. 4. The mitral valve is myxomatous. Moderate thickening of the mitral valve leaflet. Severe mitral valve stenosis. 5.Post mitral valve repair, annuloplasty ring. Post repair mitral stenosis (mean gradient 13mmHg - severe). 6. Tricuspid valve regurgitation is moderate. 7. The aortic valve is tricuspid. Aortic valve regurgitation was not assessed by color flow Doppler. 8. The inferior vena cava was dilated in size with <50% respiratory variability.  Patient Profile     38 y.o. female with a hx of mitral valve [place] in [place], annuloplasty ring with subsequent severe mitral stenosis who is being seen today for the evaluation of diastolic heart failure at the request of Dr. Nelson ChimesAmin. Valve surgery was postponed in 2019  secondary to  depression and suicidal ideation. [place], [place] and was ready to consider valve surgery.   Assessment & Plan    1. Acute diastolic CHF with known mitral valve disease: -In the setting of post mitral valve repair and severe mitral stenosis -Cath performed 03/05/2019 with mild non-obstructive CAD>>TEE with LVEF of 45-50% (was 60-65% per echo last week). Peak and mean gradients were noted to be 13 and 7mmHg. MVA by Pressure T1/2 is 1.85 cm2  consistent with mild MS 2D imaging suggests more moderate. -BNP noted to be elevated at 528 with subsequent CXR suggestive of pulmonary vascular congestion  -Weight, 222lb today>>>221lb on  -I&O, net positive 2.5L since admission>>however patient appears to be euvolemic on exam today>>>could consider increasing to 40mg  BID. On home Bumex 2mg  BID  -Lasix transitioned to PO 40mg  daily with K+ supplementation of 91meq  -Awaiting Dr. Roxy Manns for possible surgical evaluation now that she is euvolemic.    Signed, Kathyrn Drown NP-C [facility] Pager: [phone] 03/07/2019, 6:47 AM     For questions or updates, please contact   Please consult [email or web address] for contact info under Cardiology/STEMI.  Personally seen and examined. Agree with above.    38 year old post mitral valve repair in [place] with subsequent mitral stenosis.  TEE demonstrated a mean gradient of 8 mmHg, transthoracic echocardiogram while she was symptomatic with fluid overload showed gradient of 13 mmHg. Currently feeling better with diuresis.  GEN: Well nourished, well developed, in no acute distress  HEENT: normal  Neck: no JVD, carotid bruits, or masses Cardiac: RRR; soft systolic /diastolic murmur, no rubs, or gallops,no edema  Respiratory:  clear to auscultation bilaterally, normal work of breathing GI: soft, nontender, nondistended, + BS MS: no deformity or atrophy  Skin: warm and dry, no rash Neuro:  Alert and Oriented x 3, Strength and sensation are intact  Psych: euthymic mood, full affect  - Contacted Dr. Roxy Manns, he will see her today to discuss future plans, possible mitral valve replacement mechanical with long-term Coumadin versus continued watchful waiting with fluid management, maintenance diuretic.  I am fine with her currently receiving 40 mg of Lasix once a day for now as long as she continues to maintain good urine output.  Her LVEDP was 7.  She was on Bumex 2 mg twice daily at home.  When she is discharged, may need 40 mg twice daily of Lasix.  Candee Furbish, MD

## 2019-03-08 ENCOUNTER — Encounter (HOSPITAL_COMMUNITY): Payer: Self-pay | Admitting: Internal Medicine

## 2019-03-08 ENCOUNTER — Inpatient Hospital Stay (HOSPITAL_COMMUNITY): Payer: Self-pay

## 2019-03-08 DIAGNOSIS — R0602 Shortness of breath: Secondary | ICD-10-CM

## 2019-03-08 DIAGNOSIS — I052 Rheumatic mitral stenosis with insufficiency: Secondary | ICD-10-CM

## 2019-03-08 DIAGNOSIS — I361 Nonrheumatic tricuspid (valve) insufficiency: Secondary | ICD-10-CM

## 2019-03-08 DIAGNOSIS — I5031 Acute diastolic (congestive) heart failure: Secondary | ICD-10-CM

## 2019-03-08 DIAGNOSIS — J984 Other disorders of lung: Secondary | ICD-10-CM

## 2019-03-08 DIAGNOSIS — I10 Essential (primary) hypertension: Secondary | ICD-10-CM

## 2019-03-08 LAB — BASIC METABOLIC PANEL
Anion gap: 6 (ref 5–15)
BUN: 12 mg/dL (ref 6–20)
CO2: 24 mmol/L (ref 22–32)
Calcium: 9 mg/dL (ref 8.9–10.3)
Chloride: 108 mmol/L (ref 98–111)
Creatinine, Ser: 0.93 mg/dL (ref 0.44–1.00)
GFR calc Af Amer: >60 mL/min (ref >60)
GFR calc non Af Amer: >60 mL/min (ref >60)
Glucose, Bld: 117 mg/dL — ABNORMAL HIGH (ref 70–99)
Potassium: 4.1 mmol/L (ref 3.5–5.1)
Sodium: 138 mmol/L (ref 135–145)

## 2019-03-08 MED ORDER — BUSPIRONE HCL 7.5 MG PO TABS: 7.5000 mg | ORAL_TABLET | Freq: Three times a day (TID) | ORAL | 1 refills | Status: DC

## 2019-03-08 MED ORDER — FUROSEMIDE 40 MG PO TABS: 40.0000 mg | ORAL_TABLET | Freq: Every day | ORAL | 0 refills | Status: DC

## 2019-03-08 MED ORDER — GADOBUTROL 1 MMOL/ML IV SOLN
12.0000 mL | Freq: Once | INTRAVENOUS | Status: AC | PRN
  Administered 2019-03-08: 12 mL via INTRAVENOUS

## 2019-03-08 MED ORDER — CLONAZEPAM 0.5 MG PO TABS: 0.5000 mg | ORAL_TABLET | Freq: Two times a day (BID) | ORAL | 0 refills | Status: DC

## 2019-03-08 MED FILL — clonazePAM 0.5 MG TABS: 0.5 | 30 days supply | Qty: 60 | Fill #0

## 2019-03-08 MED FILL — busPIRone HCL 15 MG TABS: 15 | 30 days supply | Qty: 45 | Fill #0

## 2019-03-08 MED FILL — FUROSEMIDE 40 MG TABLET: 40 | 90 days supply | Qty: 90 | Fill #0

## 2019-03-08 NOTE — Discharge Summary (Signed)
Physician Discharge Summary  Theresa Crawford WJX:[id number] DOB: [birth year] DOA: 03/03/2019  PCP: Jerrell BelfastHouse, [name], [name]  Admit date: 03/03/2019 Discharge date: 03/08/2019  Admitted From: Home Disposition: Home  Recommendations for Outpatient Follow-up:  1. Follow up with PCP and cardiology in 1-2 weeks 2. Cardiothoracic surgery to arrange outpatient follow-up for evaluation of mitral valve 3. Recommend follow-up with pulmonology and ophthalmology for sarcoidosis 4. Please obtain CBC/BMP/Mag at follow up 5. Please follow up on the following pending results: None  Home Health: None Equipment/Devices: None  Discharge Condition: Stable CODE STATUS: Full code  Hospital Course: 38 year old female with history of anxiety, depression, sarcoidosis, hypertension, recurrent mitral stenosis/regurgitation status post repair, moderate pulmonary hypertension presenting with dyspnea and orthopnea.  Chest x-ray with cardiomegaly and pulmonary vascular congestion concerning for CHF exacerbation.  COVID-19 negative.  CBC and BMP not impressive.  Started on IV Lasix and admitted for CHF exacerbation. Cardiology consulted.  [facility].  TEE with EF of 45-50% with mild mitral stenosis.  R/LHC with mild nonobstructive CAD.    Evaluated by cardiothoracic surgery for mitral valve.  Also evaluated by pulmonology for sarcoidosis.  PFT performed and consistent with restrictive lung disease.  See detailed read below for TTE, TEE, R/LHC and PFT. Patient's breathing and edema improved significantly.  Discharged home to follow-up with PCP, cardiology, cardiothoracic surgery, pulmonology and ophthalmology.  Discharge Diagnoses:  Acute diastolic CHF/severe pulmonary hypertension: chest x-ray concerning for CHF -Cardiology guided care.  -TTE: EF of 60-65%, impaired diastolic relaxation, RVSP 88mmHg, mod LAE, mild edematous MV, severe MS and mod TR.  -TEE with EF of 45 to 50%,  mild mitral stenosis  -R/LHC with mild nonobstructive CAD  -Cardiopulmonary symptoms improved. -Discharged on Lasix 40 mg daily, metoprolol XL 37.5 mg daily. -Counseled on sodium and fluid restriction as well as daily weights -Follow-up with PCP and cardiology.   -Assess fluid status, BMP and magnesium at follow-up.  History of mitral valve stenosis/regurg status post mitral valve repair -TTE and TEE findings as above. -Cardiothoracic surgery to arrange outpatient follow-up  Restrictive lung disease/concern for systemic sarcoidosis: Evaluated by pulmonology.  PFT with minimal interstitial restriction and significant diffusion defect -Outpatient pulmonology follow-up -Outpatient ophthalmology follow-up  Anxiety/depression: Improved -Changed Ativan to Klonopin-longer half-life and less withdrawal symptoms. -Added buspirone 7.5 mg 3 times daily -Continue home Zoloft, trazodone and Seroquel.  Next iron deficiency anemia/anemia of chronic disease: Stable .  Hemoglobin at baseline.  Iron saturation 9%. -Received iron infusion x1 -Continue ferrous sulfate  Discharge Instructions  Discharge Instructions    (HEART FAILURE PATIENTS) Call MD:  Anytime you have any of the following symptoms: 1) 3 pound weight gain in 24 hours or 5 pounds in 1 week 2) shortness of breath, with or without a dry hacking cough 3) swelling in the hands, feet or stomach 4) if you have to sleep on extra pillows at night in order to breathe.   Complete by: As directed    Call MD for:  difficulty breathing, headache or visual disturbances   Complete by: As directed    Call MD for:  extreme fatigue   Complete by: As directed    Call MD for:  persistant nausea and vomiting   Complete by: As directed    Diet - low sodium heart healthy   Complete by: As directed    Discharge instructions   Complete by: As directed    It has been a pleasure taking  care of you! You were admitted with heart failure exacerbation.  You  were treated for this to the point we think it is safe to let you go home.  You were also evaluated your heart valve.  We recommend limiting your salt/sodium intake to 2 g (2000 mg) a day, and fluid intake to 1500 cc (6 cups) a day.  You may take additional dose of your Lasix if you notice more leg swelling, shortness of breath or weight gain greater than 2 to 3 pounds from the previous day.  Please follow-up with your primary care doctor, cardiothoracic surgeon, cardiology and pulmonology in 1 to 2 weeks or as scheduled.  We also recommend you see an eye doctor for the sarcoidosis.  This can be arranged by your primary care doctor.   Please review your new medication list and directions before you take your medications.   Take care, Dr. Cyndia Skeeters   Increase activity slowly   Complete by: As directed      Allergies as of 03/08/2019      Reactions   Bee Venom Anaphylaxis   Lisinopril Shortness Of Breath, Swelling, Other (See Comments)   Throat swelling   Penicillins Shortness Of Breath, Swelling, Other (See Comments)   Has patient had a PCN reaction causing immediate rash, facial/tongue/throat swelling, SOB or lightheadedness with hypotension: Yes Has patient had a PCN reaction causing severe rash involving mucus membranes or skin necrosis: Yes Has patient had a PCN reaction that required hospitalization No Has patient had a PCN reaction occurring within the last 10 years: No If all of the above answers are "NO", then may proceed with Cephalosporin use.   Perflutren Lipid Microsphere Shortness Of Breath, Other (See Comments)   Other reaction(s): Chest Pain/Tightness   Shellfish Allergy Anaphylaxis   Contrast Media [iodinated Diagnostic Agents] Hives, Itching, Other (See Comments)   Itching and hives to throat, neck, face and arms.   No difficulty breathing.   This was given at [facility] approximately 2015. Contrast Dye      Medication List    STOP taking these medications    bumetanide 1 MG tablet Commonly known as: BUMEX   LORazepam 0.5 MG tablet Commonly known as: ATIVAN     TAKE these medications   albuterol 108 (90 Base) MCG/ACT inhaler Commonly known as: VENTOLIN HFA Inhale 2 puffs into the lungs every 6 (six) hours as needed for wheezing or shortness of breath.   busPIRone 7.5 MG tablet Commonly known as: BUSPAR Take 1 tablet (7.5 mg total) by mouth 3 (three) times daily.   clonazePAM 0.5 MG tablet Commonly known as: KLONOPIN Take 1 tablet (0.5 mg total) by mouth 2 (two) times daily.   docusate sodium 100 MG capsule Commonly known as: COLACE Take 100 mg by mouth daily as needed for moderate constipation.   EpiPen 2-Pak 0.3 mg/0.3 mL Soaj injection Generic drug: EPINEPHrine Inject 0.3 Units into the muscle once.   ferrous sulfate 325 (65 FE) MG tablet Take 1 tablet (325 mg total) by mouth 2 (two) times daily with a meal. What changed: when to take this   folic acid 762 MCG tablet Commonly known as: FOLVITE Take 400 mcg by mouth daily.   furosemide 40 MG tablet Commonly known as: LASIX Take 1 tablet (40 mg total) by mouth daily. Start taking on: March 09, 2019   metoprolol succinate 25 MG 24 hr tablet Commonly known as: TOPROL-XL Take 1.5 tablets (37.5 mg total) by mouth daily.  potassium chloride SA 20 MEQ tablet Commonly known as: K-DUR Take 2 tablets (40 mEq total) by mouth daily. Take 2 tablets daily   PROBIOTIC PO Take 1 capsule by mouth daily.   QUEtiapine 50 MG tablet Commonly known as: SEROQUEL Take 1 tablet (50 mg total) by mouth at bedtime.   sertraline 100 MG tablet Commonly known as: ZOLOFT Take 1 tablet (100 mg total) by mouth daily.   traZODone 50 MG tablet Commonly known as: DESYREL Take 0.5 tablets (25 mg total) by mouth at bedtime. What changed: how much to take   vitamin B-12 1000 MCG tablet Commonly known as: CYANOCOBALAMIN Take 1,000 mcg by mouth daily.      Follow-up Information    House,  Eugenio HoesKaren A, [name]. Schedule an appointment as soon as possible for a visit in 1 week(s).   Specialty: Nurse Practitioner Contact information: [address] [place] [address] [phone]        Laqueta LindenKoneswaran, Suresh A, MD .   Specialty: Cardiology Contact information: [address] [place] [address] [phone]        [facility]. Go on 03/13/2019.   Why: at 3:10 with Leonie ManMichelle Edwards Contact information: [address] [place] [phone]          Consultations:  Cardiology  Cardiothoracic surgery  Pulmonology  Procedures/Studies:  TTE on 03/04/2019  1. The left ventricle has normal systolic function with an ejection fraction of 60-65%. The cavity size was normal. Left ventricular diastolic Doppler parameters are consistent with pseudonormalization. There is right ventricular volume and pressure  overload. No evidence of left ventricular regional wall motion abnormalities.  2. The right ventricle has normal systolic function. The cavity was moderately enlarged. There is no increase in right ventricular wall thickness. Right ventricular systolic pressure is severely elevated with an estimated pressure of 88.1 mmHg.  3. Left atrial size was moderately dilated.  4. The mitral valve is myxomatous. Moderate thickening of the mitral valve leaflet. Severe mitral valve stenosis.  5. Post mitral valve repair, annuloplasty ring. Post repair mitral stenosis (mean gradient 13mmHg - severe).  6. Tricuspid valve regurgitation is moderate.  7. The aortic valve is tricuspid. Aortic valve regurgitation was not assessed by color flow Doppler.  8. The inferior vena cava was dilated in size with <50% respiratory variability.   TEE on 03/05/2019 1. The left ventricle has mildly reduced systolic function, with an ejection fraction of 45-50%. The cavity size was normal.  2. The right ventricle has mildly reduced systolic  function. The cavity was mildly enlarged.  3. LA, LAA without masses.  4. S/P Mitral valve repair. 2D and 3D imaging done. The mitral leaflet is thickened with restricted motion, particularly the posterior leaflet. Peak and mean gradients through the valve are 13 and 8 mm Hg respectively. MVA by Pressure T1/2 is 1.85 cm2  consistent with mild MS 2D imaging suggests more moderate.  5. Tricuspid valve regurgitation is mild-moderate.  6. The aortic valve is tricuspid Aortic valve regurgitation is trivial by color flow Doppler.   R/LHC on 03/05/2019-mild nonobstructive CAD   Prox RCA to Mid RCA lesion is 10% stenosed.  Prox Cx to Mid Cx lesion is 20% stenosed.  Mid LAD lesion is 10% stenosed.    PFT on 03/07/2019 Good patient effort. The results meets ATS standards, although the DLCO received error code IVC < 90%. Hgb value 12.3 g/dL as of [id number] taken from CBC. HHN given  with 2.5mg  of Albuterol  The FVC is reduced, but the FEV1/FVC ratio is increased. Patient effort was erratic, but the normal FEV1 indicates no significant obstruction. The FVC is reduced relative to the SVC indicating air trapping. The airway resistance is normal. The reduced diffusing capacity indicates a moderately severe loss of functional alveolar capillary surface.  Conclusions:  The reduced lung volumes, increased FEV1/FVC ratio and diffusion defect suggest an interstitial process such as fibrosis or interstitial inflammation. In view of the severity of the diffusion defect, studies with exercise would be helpful to evaluate the presence of hypoxemia.  Pulmonary Function Diagnosis: Minimal Restriction -Interstitial Moderately severe Diffusion Defect Insignificant response to bronchodilator  Dg Chest Portable 1 View  Result Date: 03/03/2019 CLINICAL DATA:  Acute shortness of breath and cough for 1 day. EXAM: PORTABLE CHEST 1 VIEW COMPARISON:  11/06/2018 chest CT, chest radiograph and prior studies. FINDINGS:  Cardiomegaly and cardiac valve replacement again noted. Pulmonary vascular congestion and interstitial opacities are noted. A small RIGHT pleural effusion is present. No pneumothorax or acute bony abnormality. IMPRESSION: Cardiomegaly with pulmonary vascular congestion and interstitial opacities, favor edema. Small RIGHT pleural effusion. Electronically Signed   By: Harmon PierJeffrey  Hu M.D.   On: 03/03/2019 20:48      Subjective: No major events overnight of this morning.  Denies chest pain, dyspnea, orthopnea, palpitation or dizziness.  Denies GI or GU symptoms.  Feels ready to go home.  Anxiety better controlled.   Discharge Exam: Vitals:   03/08/19 0507 03/08/19 0815  BP: 113/62 (!) 95/59  Pulse: 89 78  Resp: 20   Temp: 98.1 F (36.7 C)   SpO2: 97%     GENERAL: No acute distress.  Appears well.  HEENT: MMM.  Vision and hearing grossly intact.  NECK: Supple.  No JVD.  LUNGS:  No IWOB. Good air movement bilaterally. HEART:  RRR. Heart sounds normal.  ABD: Bowel sounds present. Soft. Non tender.  MSK/EXT:  Moves all extremities. No apparent deformity. No edema bilaterally. SKIN: no apparent skin lesion or wound NEURO: Awake, alert and oriented appropriately.  No gross deficit.  PSYCH: Calm. Normal affect.   The results of significant diagnostics from this hospitalization (including imaging, microbiology, ancillary and laboratory) are listed below for reference.     Microbiology: Recent Results (from the past 240 hour(s))  SARS Coronavirus 2 (CEPHEID- Performed [facility]), Hosp Order     Status: None   Collection Time: 03/03/19  7:14 PM   Specimen: Nasopharyngeal Swab  Result Value Ref Range Status   SARS Coronavirus 2 NEGATIVE NEGATIVE Final    Comment: (NOTE) If result is NEGATIVE SARS-CoV-2 target nucleic acids are NOT DETECTED. The SARS-CoV-2 RNA is generally detectable in upper and lower  respiratory specimens during the acute phase of infection. The lowest   concentration of SARS-CoV-2 viral copies this assay can detect is 250  copies / mL. A negative result does not preclude SARS-CoV-2 infection  and should not be used as the sole basis for treatment or other  patient management decisions.  A negative result may occur with  improper specimen collection / handling, submission of specimen other  than nasopharyngeal swab, presence of viral mutation(s) within the  areas targeted by this assay, and inadequate number of viral copies  (<250 copies / mL). A negative result must be combined with clinical  observations, patient history, and epidemiological information. If result is POSITIVE SARS-CoV-2 target nucleic acids are DETECTED. The SARS-CoV-2 RNA is generally  detectable in upper and lower  respiratory specimens dur ing the acute phase of infection.  Positive  results are indicative of active infection with SARS-CoV-2.  Clinical  correlation with patient history and other diagnostic information is  necessary to determine patient infection status.  Positive results do  not rule out bacterial infection or co-infection with other viruses. If result is PRESUMPTIVE POSTIVE SARS-CoV-2 nucleic acids MAY BE PRESENT.   A presumptive positive result was obtained on the submitted specimen  and confirmed on repeat testing.  While 2019 novel coronavirus  (SARS-CoV-2) nucleic acids may be present in the submitted sample  additional confirmatory testing may be necessary for epidemiological  and / or clinical management purposes  to differentiate between  SARS-CoV-2 and other Sarbecovirus currently known to infect humans.  If clinically indicated additional testing with an alternate test  methodology [phone]) is advised. The SARS-CoV-2 RNA is generally  detectable in upper and lower respiratory sp ecimens during the acute  phase of infection. The expected result is Negative. Fact Sheet for Patients:  BoilerBrush.com.cy Fact Sheet  for Healthcare Providers: https://pope.com/ This test is not yet approved or cleared by the [place] FDA and has been authorized for detection and/or diagnosis of SARS-CoV-2 by FDA under an Emergency Use Authorization (EUA).  This EUA will remain in effect (meaning this test can be used) for the duration of the COVID-19 declaration under Section 564(b)(1) of the Act, 21 U.S.C. section 360bbb-3(b)(1), unless the authorization is terminated or revoked sooner. Performed at [facility], [address], [place], [place] [address]      Labs: BNP (last 3 results) Recent Labs    10/05/18 0028 11/06/18 0051 03/03/19 1914  BNP 225.0* 440.0* 528.0*   Basic Metabolic Panel: Recent Labs  Lab 03/03/19 1914 03/04/19 0728 03/05/19 0803 03/05/19 1534 03/05/19 1615 03/06/19 0409  NA 137 134* 138 143 143 135  K 3.8 3.3* 4.2 4.6 4.5 4.8  CL 108 104 108  --   --  104  CO2 --   --  20*  GLUCOSE 102* 82 91  --   --  166*  BUN --   --  20  CREATININE 0.83 1.04* 1.04*  --   --  1.06*  CALCIUM 8.7* 8.4* 8.8*  --   --  9.5  MG 2.2  --  2.1  --   --   --    Liver Function Tests: Recent Labs  Lab 03/04/19 0728  AST 18  ALT 11  ALKPHOS 61  BILITOT 0.7  PROT 7.3  ALBUMIN 3.5   No results for input(s): LIPASE, AMYLASE in the last 168 hours. No results for input(s): AMMONIA in the last 168 hours. CBC: Recent Labs  Lab 03/03/19 1914 03/04/19 0728 03/05/19 0803 03/05/19 1534 03/05/19 1615 03/06/19 0409  WBC 7.4 6.7 6.8  --   --  8.4  HGB 11.0* 10.8* 11.4* 14.3 13.3 12.3  HCT 37.2 36.2 37.1 42.0 39.0 39.9  MCV 80.5 82.8 78.8*  --   --  79.2*  PLT 344 338 386  --   --  460*   Cardiac Enzymes: Recent Labs  Lab 03/04/19 0229 03/04/19 0728  TROPONINI <0.03 <0.03   BNP: Invalid input(s): POCBNP CBG: No results for input(s): GLUCAP in the last 168 hours. D-Dimer No results for input(s): DDIMER in the last 72 hours. Hgb A1c No  results for input(s): HGBA1C in the last 72 hours. Lipid Profile  No results for input(s): CHOL, HDL, LDLCALC, TRIG, CHOLHDL, LDLDIRECT in the last 72 hours. Thyroid function studies No results for input(s): TSH, T4TOTAL, T3FREE, THYROIDAB in the last 72 hours.  Invalid input(s): FREET3 Anemia work up Recent Labs    03/06/19 1630  VITAMINB12 376  FOLATE 13.6  FERRITIN 71  TIBC 381  IRON 36  RETICCTPCT 2.7   Urinalysis    Component Value Date/Time   COLORURINE YELLOW 07/09/2018 1723   APPEARANCEUR CLEAR 07/09/2018 1723   LABSPEC 1.024 07/09/2018 1723   PHURINE 6.0 07/09/2018 1723   GLUCOSEU NEGATIVE 07/09/2018 1723   HGBUR LARGE (A) 07/09/2018 1723   BILIRUBINUR NEGATIVE 07/09/2018 1723   KETONESUR NEGATIVE 07/09/2018 1723   PROTEINUR 30 (A) 07/09/2018 1723   NITRITE NEGATIVE 07/09/2018 1723   LEUKOCYTESUR SMALL (A) 07/09/2018 1723   Sepsis Labs Invalid input(s): PROCALCITONIN,  WBC,  LACTICIDVEN   Time coordinating discharge: 45 minutes  SIGNED:  Almon Hercules, MD  [facility] 03/08/2019, 10:39 AM  If 7PM-7AM, please contact night-coverage [email or web address] Password [email or web address]

## 2019-03-08 NOTE — Progress Notes (Signed)
Discharge AVS meds take and those due reviewed with pt. Follow up appointments and when to call MD reviewed. All questions and concerns addressed. No further questions at this time. D/c IV and TELE, CCMD notified. D/C home per orders. Pt received d/c meds from [place]. Pt brought down via wheelchair with staff.

## 2019-03-08 NOTE — Progress Notes (Addendum)
Progress Note  Patient Name: Theresa Crawford Date of Encounter: 03/08/2019  Primary Cardiologist: Prentice DockerSuresh Koneswaran, MD  Subjective   Pt doing well this AM. Continues to dey SOB, chest pain.   Inpatient Medications    Scheduled Meds: . busPIRone  7.5 mg Oral TID  . clonazePAM  0.5 mg Oral BID  . enoxaparin (LOVENOX) injection  0.5 mg/kg Subcutaneous Q24H  . furosemide  40 mg Oral Daily  . metoprolol succinate  37.5 mg Oral Daily  . potassium chloride SA  10 mEq Oral Daily  . QUEtiapine  50 mg Oral QHS  . saccharomyces boulardii  250 mg Oral Daily  . sertraline  100 mg Oral Daily  . sodium chloride flush  3 mL Intravenous Q12H  . traZODone  50 mg Oral QHS   Continuous Infusions: . sodium chloride     PRN Meds: sodium chloride, albuterol, docusate sodium, sodium chloride flush   Vital Signs    Vitals:   03/07/19 0832 03/07/19 1249 03/07/19 2023 03/08/19 0507  BP:  (!) 119/94 111/62 113/62  Pulse: 85 98 94 89  Resp:  20 (!) 22 20  Temp:  98.4 F (36.9 C) 98.2 F (36.8 C) 98.1 F (36.7 C)  TempSrc:  Oral Oral Oral  SpO2:  96% 98% 97%  Weight:    101.6 kg  Height:        Intake/Output Summary (Last 24 hours) at 03/08/2019 0728 Last data filed at 03/07/2019 1711 Gross per 24 hour  Intake 521.06 ml  Output -  Net 521.06 ml   Filed Weights   03/06/19 0500 03/07/19 0500 03/08/19 0507  Weight: 97.5 kg 100.9 kg 101.6 kg    Physical Exam   General: Obese, NAD Skin: Warm, dry, intact  Neck: Negative for carotid bruits. No JVD Lungs:Clear to ausculation bilaterally. No wheezes, rales, or rhonchi. Breathing is unlabored. Cardiovascular: RRR with S1 S2. + murmur Abdomen: Soft, non-tender, non-distended. No obvious abdominal masses. Extremities: No edema. No clubbing or cyanosis. DP/PT pulses 2+ bilaterally Neuro: Alert and oriented. No focal deficits. No facial asymmetry. MAE spontaneously. Psych: Responds to questions appropriately with normal affect.     Labs    Chemistry Recent Labs  Lab 03/04/19 0728 03/05/19 0803 03/05/19 1534 03/05/19 1615 03/06/19 0409  NA 134* 138 143 143 135  K 3.3* 4.2 4.6 4.5 4.8  CL 104 108  --   --  104  CO2 24 22  --   --  20*  GLUCOSE 82 91  --   --  166*  BUN 12 16  --   --  20  CREATININE 1.04* 1.04*  --   --  1.06*  CALCIUM 8.4* 8.8*  --   --  9.5  PROT 7.3  --   --   --   --   ALBUMIN 3.5  --   --   --   --   AST 18  --   --   --   --   ALT 11  --   --   --   --   ALKPHOS 61  --   --   --   --   BILITOT 0.7  --   --   --   --   GFRNONAA >60 >60  --   --  >60  GFRAA >60 >60  --   --  >60  ANIONGAP 6 8  --   --  11  Hematology Recent Labs  Lab 03/04/19 0728 03/05/19 0803 03/05/19 1534 03/05/19 1615 03/06/19 0409 03/06/19 1630  WBC 6.7 6.8  --   --  8.4  --   RBC 4.37 4.71  --   --  5.04 5.06  HGB 10.8* 11.4* 14.3 13.3 12.3  --   HCT 36.2 37.1 42.0 39.0 39.9  --   MCV 82.8 78.8*  --   --  79.2*  --   MCH 24.7* 24.2*  --   --  24.4*  --   MCHC 29.8* 30.7  --   --  30.8  --   RDW 20.9* 20.7*  --   --  20.8*  --   PLT 338 386  --   --  460*  --     Cardiac Enzymes Recent Labs  Lab 03/04/19 0229 03/04/19 0728  TROPONINI <0.03 <0.03   No results for input(s): TROPIPOC in the last 168 hours.   BNP Recent Labs  Lab 03/03/19 1914  BNP 528.0*    DDimer No results for input(s): DDIMER in the last 168 hours.   Radiology    No results found.  Telemetry    03/08/2019 NSR with rates in the 60-70's- Personally Reviewed  ECG    03/05/2019 NSR with HR 98bpm, Qtc 468 and no acute ischemic changes- Personally Reviewed  Cardiac Studies   RIGHT/LEFT HEART CATH AND CORONARY ANGIOGRAPHY6/22/2020  Conclusion    Prox RCA to Mid RCA lesion is 10% stenosed.  Prox Cx to Mid Cx lesion is 20% stenosed.  Mid LAD lesion is 10% stenosed.  1. Mild non-obstructive CAD    TEE 03/05/2019 1. The left ventricle has mildly reduced systolic function, with an ejection  fraction of 45-50%. The cavity size was normal. 2. The right ventricle has mildly reduced systolic function. The cavity was mildly enlarged. 3. LA, LAA without masses. 4. S/P Mitral valve repair. 2D and 3D imaging done. The mitral leaflet is thickened with restricted motion, particularly the posterior leaflet. Peak and mean gradients through the valve are 13 and 8 mm Hg respectively. MVA by Pressure T1/2 is 1.85 cm2  consistent with mild MS 2D imaging suggests more moderate. 5. Tricuspid valve regurgitation is mild-moderate. 6. The aortic valve is tricuspid Aortic valve regurgitation is trivial by color flow Doppler.  ECHO 03/04/19: 1. The left ventricle has normal systolic function with an ejection fraction of 60-65%. The cavity size was normal. Left ventricular diastolic Doppler parameters are consistent with pseudonormalization. There is right ventricular volume and pressure  overload. No evidence of left ventricular regional wall motion abnormalities. 2. The right ventricle has normal systolic function. The cavity was moderately enlarged. There is no increase in right ventricular wall thickness. Right ventricular systolic pressure is severely elevated with an estimated pressure of 88.1 mmHg. 3. Left atrial size was moderately dilated. 4. The mitral valve is myxomatous. Moderate thickening of the mitral valve leaflet. Severe mitral valve stenosis. 5.Post mitral valve repair, annuloplasty ring. Post repair mitral stenosis (mean gradient 13mmHg - severe). 6. Tricuspid valve regurgitation is moderate. 7. The aortic valve is tricuspid. Aortic valve regurgitation was not assessed by color flow Doppler. 8. The inferior vena cava was dilated in size with <50% respiratory variability.  Patient Profile     38 y.o. female [place], annuloplasty ring with subsequent severe mitral stenosis and sarcoidosiswho is being seen today for the evaluation of  diastolic heart failureat the request of Dr. Nelson ChimesAmin.  Valve surgery was postponed in 2019 secondary to depression and suicidal ideation. [employer], [place] and was ready to consider valve surgery.   Assessment & Plan    1. Acute diastolic CHF with known mitral valve disease: -In the setting of post mitral valve repair and severe mitral stenosis -Cath performed 03/05/2019 with mild non-obstructive CAD>>TEE with LVEF of 45-50% (was 60-65% per echo last week). Peak and mean gradients were noted to be 13 and 30mmHg. MVA by Pressure T1/2 is 1.85 cm2  consistent with mild MS 2D imaging suggests more moderate. -BNP noted to be elevated at 528 with subsequent CXR suggestive of pulmonary vascular congestion  -Weight, 224lb today>>>221lb on admission  -I&O, net positive 3L since admission>>however patient appears to be euvolemic on exam today>>>could consider increasing to 40mg  BID. On home Bumex 2mg  BID  -Lasix transitioned to PO 40mg  daily with K+ supplementation of 66meq>>continue for now -Seen by Dr. Roxy Manns for 03/07/2019 with long discussion regarding mitral surgery. Options for mechanical versus bioprosthesis were discussed along with the implications of both. Pt wishes to think about the surgery and follow in the OP setting with TCTS. In the meantime, pulmonary medicine has been consulted secondary to hx of sarcoidosis>>with probable multisystem sarcoid without biopsy proven dx.  -Plan for long term steroid treatment after surgery>>would benefit from mediastinal adenopathy biopsy at time of surgery for pathology as well a possible cardiac MRI   -Will repeat BMET this AM and possibly increase Lasix to 40mg  BID given increasing fluid balance.    Signed, Kathyrn Drown NP-C [facility] Pager: [phone] 03/08/2019, 7:28 AM     For questions or updates, please contact   Please consult [email or web address] for contact info under Cardiology/STEMI.  Personally seen and examined. Agree with above.    Feeling comfortable currently.  No significant shortness of breath.  No chest pain.  GEN: Well nourished, well developed, in no acute distress  HEENT: normal  Neck: no JVD, carotid bruits, or masses Cardiac: RRR; soft systolic/diastolic murmur, no rubs, or gallops,no edema  Respiratory:  clear to auscultation bilaterally, normal work of breathing GI: soft, nontender, nondistended, + BS MS: no deformity or atrophy  Skin: warm and dry, no rash Neuro:  Alert and Oriented x 3, Strength and sensation are intact Psych: euthymic mood, full affect, smiling today  Lab work reviewed.  BMET pending  Assessment and plan:  Mitral stenosis in the setting of mitral valve repair with annuloplasty ring - Appreciate Dr. Roxy Manns discussing options for her. - She has had pharmacy consultation to with option of Coumadin. - She will continue to deliberate and have follow-up with Dr. Roxy Manns as outpatient. - As of now, I would advocate for Lasix 40 mg p.o. twice daily as outpatient and I have encouraged fluid restrictions of 1.5 L and salt restrictions 2 g.  She knows to daily weigh.  She made a marketed improvement after diuresis here.  Even though her tabulated numbers do not show any significant volume loss, she clearly, from her data from TEE as well as heart catheterization, she has been adequately diuresed.  I am comfortable with discharge.  We will have cardiothoracic surgery follow-up with Dr. Roxy Manns.  Cardiology follow-up with Dr. Bronson [facility] [place], [place])  Candee Furbish, MD   Assessment and plan:

## 2019-03-13 ENCOUNTER — Inpatient Hospital Stay: Payer: Medicaid Other | Admitting: Primary Care

## 2019-03-19 ENCOUNTER — Ambulatory Visit (INDEPENDENT_AMBULATORY_CARE_PROVIDER_SITE_OTHER): Payer: Self-pay | Admitting: Primary Care

## 2019-03-19 ENCOUNTER — Other Ambulatory Visit: Payer: Self-pay

## 2019-03-19 ENCOUNTER — Ambulatory Visit: Payer: Medicaid Other | Admitting: Thoracic Surgery (Cardiothoracic Vascular Surgery)

## 2019-03-19 ENCOUNTER — Encounter (INDEPENDENT_AMBULATORY_CARE_PROVIDER_SITE_OTHER): Payer: Self-pay | Admitting: Primary Care

## 2019-03-19 VITALS — BP 103/67 | HR 95 | Temp 97.7°F | Ht 64.0 in | Wt 219.0 lb

## 2019-03-19 DIAGNOSIS — R0602 Shortness of breath: Secondary | ICD-10-CM

## 2019-03-19 DIAGNOSIS — F333 Major depressive disorder, recurrent, severe with psychotic symptoms: Secondary | ICD-10-CM

## 2019-03-19 DIAGNOSIS — I5031 Acute diastolic (congestive) heart failure: Secondary | ICD-10-CM

## 2019-03-19 DIAGNOSIS — Z7689 Persons encountering health services in other specified circumstances: Secondary | ICD-10-CM

## 2019-03-19 DIAGNOSIS — F411 Generalized anxiety disorder: Secondary | ICD-10-CM

## 2019-03-19 DIAGNOSIS — R300 Dysuria: Secondary | ICD-10-CM

## 2019-03-19 LAB — POCT URINALYSIS DIP (CLINITEK)
Blood, UA: NEGATIVE
Glucose, UA: NEGATIVE mg/dL
Ketones, POC UA: NEGATIVE mg/dL
Leukocytes, UA: NEGATIVE
Nitrite, UA: NEGATIVE
Spec Grav, UA: >=1.03 — AB (ref 1.010–1.025)
Urobilinogen, UA: 1 E.U./dL
pH, UA: 5 (ref 5.0–8.0)

## 2019-03-19 MED ORDER — HYDROXYZINE PAMOATE 25 MG PO CAPS: 25.0000 mg | ORAL_CAPSULE | Freq: Three times a day (TID) | ORAL | 0 refills | Status: DC | PRN

## 2019-03-19 MED ORDER — SERTRALINE HCL 100 MG PO TABS: 100.0000 mg | ORAL_TABLET | Freq: Every day | ORAL | 3 refills | Status: DC

## 2019-03-19 MED ORDER — HYDROXYZINE PAMOATE 25 MG PO CAPS: 25.0000 mg | ORAL_CAPSULE | Freq: Three times a day (TID) | ORAL | 3 refills | Status: DC | PRN

## 2019-03-19 NOTE — Patient Instructions (Signed)

## 2019-03-19 NOTE — Progress Notes (Signed)
New Patient Office Visit  Subjective:  Patient ID: Theresa Crawford, female    DOB: [birth year]  Age: 38 y.o. MRN: [id number]  CC:  Chief Complaint  Patient presents with  . New Patient (Initial Visit)    HPI Theresa Crawford presents for establishment of primary care. She has concerns with future surgery for mitral valve replacement for CHF and a spot on lung needing biopsy. At this age every thing is coming at once and increased anxiety has her plucking out her eyelashes. Overwhelmed with health concerns.  Past Medical History:  Diagnosis Date  . Anxiety   . Arthritis    "lower back" (04/04/2018)  . Chronic bronchitis (HCC)   . Chronic diastolic CHF (congestive heart failure) (HCC)   . Chronic lower back pain   . DDD (degenerative disc disease), lumbar   . Depression   . GERD (gastroesophageal reflux disease)   . Headache    "weekly" (04/04/2018)  . Heart murmur   . Hypertension   . Mitral valve disease   . Normocytic anemia   . Obesity   . Palpitations   . Pericarditis   . Premature atrial contractions   . Pulmonary hypertension (HCC)   . PVC's (premature ventricular contractions)    a. h/o palpitations with event monitor in 03/2017 showing NSR with PACs/PVCs.  . Recurrent mitral valve stenosis and regurgitation s/p mitral valve repair   . S/P mitral valve repair 03/27/2013   [place]. Darcus [place] complex valvuloplasty including resuspension of entire posterior leaflet using Gore-tex neochords and 30 mm Hosey Burmester Physio ring annuloplasty  . Sarcoidosis   . Tobacco abuse   . Tricuspid regurgitation     Past Surgical History:  Procedure Laterality Date  . ABDOMINAL HERNIA REPAIR  ~ 2011  . ABDOMINAL HYSTERECTOMY  2011   "partial"; PID w/problem with fallopian tubes, had left fallopian and left ovary removed, pt still having periods  . CARDIAC CATHETERIZATION  2014  . CESAREAN SECTION  2004; 2009  . HERNIA REPAIR    . MITRAL VALVE REPAIR  03/27/2013   Dr.  Darcus AustinMark [place] - complex valvuloplasty including resuspension of posterior leaflet with 30 mm CE Physio ring annuloplasty  . MULTIPLE EXTRACTIONS WITH ALVEOLOPLASTY N/A 04/03/2018   Procedure: Extraction of tooth #30 with alveoloplasty and gross debridement of remaining teeth;  Surgeon: Charlynne PanderKulinski, Ronald F, DDS;  [facility];  Service: Oral Surgery;  Laterality: N/A;  . PARTIAL HYSTERECTOMY  2011   PID w/problem with fallopian tubes, had left fallopian and left ovary removed, pt still having periods  . RIGHT/LEFT HEART CATH AND CORONARY ANGIOGRAPHY N/A 03/05/2019   Procedure: RIGHT/LEFT HEART CATH AND CORONARY ANGIOGRAPHY;  Surgeon: Kathleene HazelMcAlhany, Christopher D, MD;  Location: [facility] INVASIVE CV LAB;  Service: Cardiovascular;  Laterality: N/A;  . TEE WITHOUT CARDIOVERSION N/A 05/26/2016   Procedure: TRANSESOPHAGEAL ECHOCARDIOGRAM (TEE);  Surgeon: Laqueta LindenSuresh A Koneswaran, MD;  Location: [facility] ENDO SUITE;  Service: Cardiovascular;  Laterality: N/A;  . TEE WITHOUT CARDIOVERSION N/A 01/25/2018   Procedure: TRANSESOPHAGEAL ECHOCARDIOGRAM (TEE) WITH PROPOFOL;  Surgeon: Jonelle SidleMcDowell, Samuel G, MD;  Location: [facility] END[facility] SUITE;  Service: Cardiovascular;  Laterality: N/A;  . TEE WITHOUT CARDIOVERSION N/A 03/05/2019   Procedure: TRANSESOPHAGEAL ECHOCARDIOGRAM (TEE);  Surgeon: Pricilla Riffleoss, Paula V, MD;  [facility];  Service: Cardiovascular;  Laterality: N/A;    Family History  Problem Relation Age of Onset  . Heart failure Father        just received  LVAD  . Heart disease Paternal Grandfather        stent placement    Social History   Socioeconomic History  . Marital status: Single    Spouse name: Not on file  . Number of children: 2  . Years of education: Not on file  . Highest education level: Not on file  Occupational History  . Not on file  Social Needs  . Financial resource strain: Not on file  . Food insecurity    Worry: Not on file    Inability: Not on file  . Transportation needs     Medical: Not on file    Non-medical: Not on file  Tobacco Use  . Smoking status: Current Some Day Smoker    Packs/day: 0.25    Years: 24.00    Pack years: 6.00    Types: Cigarettes    Start date: 11/03/1999  . Smokeless tobacco: Never Used  Substance and Sexual Activity  . Alcohol use: Yes    Comment: 04/04/2018 "1-2 beers/month; if that"  . Drug use: Not Currently  . Sexual activity: Not Currently  Lifestyle  . Physical activity    Days per week: Not on file    Minutes per session: Not on file  . Stress: Not on file  Relationships  . Social Musicianconnections    Talks on phone: Not on file    Gets together: Not on file    Attends religious service: Not on file    Active member of club or organization: Not on file    Attends meetings of clubs or organizations: Not on file    Relationship status: Not on file  . Intimate partner violence    Fear of current or ex partner: Not on file    Emotionally abused: Not on file    Physically abused: Not on file    Forced sexual activity: Not on file  Other Topics Concern  . Not on file  Social History Narrative  . Not on file    ROS Review of Systems  Constitutional: Positive for fatigue.  HENT: Negative.   Respiratory: Positive for chest tightness and shortness of breath.   Cardiovascular: Positive for chest pain and palpitations.  Gastrointestinal: Positive for diarrhea and nausea.  Endocrine: Negative.   Genitourinary: Positive for dysuria and frequency.  Allergic/Immunologic: Positive for food allergies.  Neurological: Positive for tremors, weakness and headaches.  Psychiatric/Behavioral: The patient is nervous/anxious.     Objective:   Today's Vitals: BP 103/67 (BP Location: Right Arm, Patient Position: Sitting, Cuff Size: Large)   Pulse 95   Temp 97.7 F (36.5 C) (Oral)   Ht 5\' 4"  (1.626 m)   Wt 219 lb (99.3 kg)   LMP 02/18/2019 (Exact Date)   SpO2 93%   BMI 37.59 kg/m   Physical Exam Constitutional:       Appearance: Normal appearance.  HENT:     Head: Normocephalic and atraumatic.     Nose: Nose normal.     Mouth/Throat:     Mouth: Mucous membranes are dry.  Neck:     Musculoskeletal: Normal range of motion.  Cardiovascular:     Rate and Rhythm: Tachycardia present.  Pulmonary:     Effort: Respiratory distress present.  Abdominal:     General: Abdomen is flat. Bowel sounds are normal.     Palpations: Abdomen is soft.  Musculoskeletal: Normal range of motion.  Skin:    General: Skin is warm and dry.  Neurological:  Mental Status: She is alert and oriented to person, place, and time.  Psychiatric:     Comments: depression     Assessment & Plan:   Problem List Items Addressed This Visit    None      Outpatient Encounter Medications as of 03/19/2019  Medication Sig  . busPIRone (BUSPAR) 7.5 MG tablet Take 1 tablet (7.5 mg total) by mouth 3 (three) times daily.  . clonazePAM (KLONOPIN) 0.5 MG tablet Take 1 tablet (0.5 mg total) by mouth 2 (two) times daily.  Theresa Crawford (COLACE) 100 MG capsule Take 100 mg by mouth daily as needed for moderate constipation.   . ferrous sulfate 325 (65 FE) MG tablet Take 1 tablet (325 mg total) by mouth 2 (two) times daily with a meal. (Patient taking differently: Take 325 mg by mouth daily with breakfast. )  . folic acid (FOLVITE) 454 MCG tablet Take 400 mcg by mouth daily.  . furosemide (LASIX) 40 MG tablet Take 1 tablet (40 mg total) by mouth daily.  . metoprolol succinate (TOPROL-XL) 25 MG 24 hr tablet Take 1.5 tablets (37.5 mg total) by mouth daily.  . potassium chloride SA (K-DUR) 20 MEQ tablet Take 2 tablets (40 mEq total) by mouth daily. Take 2 tablets daily  . Probiotic Product (PROBIOTIC PO) Take 1 capsule by mouth daily.  . QUEtiapine (SEROQUEL) 50 MG tablet Take 1 tablet (50 mg total) by mouth at bedtime.  . sertraline (ZOLOFT) 100 MG tablet Take 1 tablet (100 mg total) by mouth daily.  Theresa Kitchen albuterol (PROVENTIL HFA;VENTOLIN HFA)  108 (90 Base) MCG/ACT inhaler Inhale 2 puffs into the lungs every 6 (six) hours as needed for wheezing or shortness of breath.  . EPINEPHrine (EPIPEN 2-PAK) 0.3 mg/0.3 mL IJ SOAJ injection Inject 0.3 Units into the muscle once.   . traZODone (DESYREL) 50 MG tablet Take 0.5 tablets (25 mg total) by mouth at bedtime. (Patient not taking: Reported on 03/19/2019)  . vitamin B-12 (CYANOCOBALAMIN) 1000 MCG tablet Take 1,000 mcg by mouth daily.   No facility-administered encounter medications on file as of 03/19/2019.   Roderic Ovens was seen today for new patient (initial visit).  Diagnoses and all orders for this visit:  Acute diastolic congestive heart failure ([place]) Having work up for surgery for MVP under the care of Dr. Kathleen Argue. All cardiac medication will be refilled and prescribed by cardiology   Severe episode of recurrent major depressive disorder, with psychotic features ([place]) Hallucination, feels outcast rather be reclusive , feels scared most of the time mood swing. Refill antidepressant on Buspar for anxiety and zolft for depression will refer to therapy  SOB (shortness of breath) Secondary to obesity and ChF- any exertion increases shortness of breath   Encounter to establish care Changing providers establishing care at [facility]  Generalized anxiety disorder Referring to psych and she is on buspar 7.5 mg  Dysuria  Frequency, burring and odors r/o UTI with urinalysis negative     Follow-up: No follow-ups on file.   Kerin Perna, NP

## 2019-03-24 ENCOUNTER — Other Ambulatory Visit: Payer: Self-pay

## 2019-03-24 ENCOUNTER — Inpatient Hospital Stay (HOSPITAL_COMMUNITY)
Admission: EM | Admit: 2019-03-24 | Discharge: 2019-03-29 | DRG: 286 | Disposition: A | Discharge status: Home Health | Payer: Medicaid Other | Attending: Internal Medicine | Admitting: Internal Medicine

## 2019-03-24 ENCOUNTER — Encounter (HOSPITAL_COMMUNITY): Payer: Self-pay

## 2019-03-24 ENCOUNTER — Emergency Department (HOSPITAL_COMMUNITY): Payer: Self-pay

## 2019-03-24 DIAGNOSIS — Z91041 Radiographic dye allergy status: Secondary | ICD-10-CM

## 2019-03-24 DIAGNOSIS — I052 Rheumatic mitral stenosis with insufficiency: Secondary | ICD-10-CM | POA: Diagnosis present

## 2019-03-24 DIAGNOSIS — I050 Rheumatic mitral stenosis: Secondary | ICD-10-CM

## 2019-03-24 DIAGNOSIS — E669 Obesity, unspecified: Secondary | ICD-10-CM | POA: Diagnosis present

## 2019-03-24 DIAGNOSIS — E876 Hypokalemia: Secondary | ICD-10-CM | POA: Diagnosis present

## 2019-03-24 DIAGNOSIS — D649 Anemia, unspecified: Secondary | ICD-10-CM | POA: Diagnosis present

## 2019-03-24 DIAGNOSIS — R0902 Hypoxemia: Secondary | ICD-10-CM

## 2019-03-24 DIAGNOSIS — I110 Hypertensive heart disease with heart failure: Principal | ICD-10-CM | POA: Diagnosis present

## 2019-03-24 DIAGNOSIS — I5033 Acute on chronic diastolic (congestive) heart failure: Secondary | ICD-10-CM | POA: Diagnosis present

## 2019-03-24 DIAGNOSIS — Z20828 Contact with and (suspected) exposure to other viral communicable diseases: Secondary | ICD-10-CM | POA: Diagnosis present

## 2019-03-24 DIAGNOSIS — D869 Sarcoidosis, unspecified: Secondary | ICD-10-CM

## 2019-03-24 DIAGNOSIS — I10 Essential (primary) hypertension: Secondary | ICD-10-CM | POA: Diagnosis present

## 2019-03-24 DIAGNOSIS — K219 Gastro-esophageal reflux disease without esophagitis: Secondary | ICD-10-CM | POA: Diagnosis present

## 2019-03-24 DIAGNOSIS — J9601 Acute respiratory failure with hypoxia: Secondary | ICD-10-CM | POA: Diagnosis present

## 2019-03-24 DIAGNOSIS — Z91030 Bee allergy status: Secondary | ICD-10-CM

## 2019-03-24 DIAGNOSIS — Z720 Tobacco use: Secondary | ICD-10-CM | POA: Diagnosis present

## 2019-03-24 DIAGNOSIS — Z91013 Allergy to seafood: Secondary | ICD-10-CM

## 2019-03-24 DIAGNOSIS — D860 Sarcoidosis of lung: Secondary | ICD-10-CM | POA: Diagnosis present

## 2019-03-24 DIAGNOSIS — Z79899 Other long term (current) drug therapy: Secondary | ICD-10-CM

## 2019-03-24 DIAGNOSIS — J810 Acute pulmonary edema: Secondary | ICD-10-CM | POA: Diagnosis present

## 2019-03-24 DIAGNOSIS — J9801 Acute bronchospasm: Secondary | ICD-10-CM

## 2019-03-24 DIAGNOSIS — I2510 Atherosclerotic heart disease of native coronary artery without angina pectoris: Secondary | ICD-10-CM | POA: Diagnosis present

## 2019-03-24 DIAGNOSIS — I509 Heart failure, unspecified: Secondary | ICD-10-CM

## 2019-03-24 DIAGNOSIS — F418 Other specified anxiety disorders: Secondary | ICD-10-CM | POA: Diagnosis present

## 2019-03-24 DIAGNOSIS — Z880 Allergy status to penicillin: Secondary | ICD-10-CM

## 2019-03-24 DIAGNOSIS — F17210 Nicotine dependence, cigarettes, uncomplicated: Secondary | ICD-10-CM | POA: Diagnosis present

## 2019-03-24 DIAGNOSIS — Z888 Allergy status to other drugs, medicaments and biological substances status: Secondary | ICD-10-CM

## 2019-03-24 DIAGNOSIS — I272 Pulmonary hypertension, unspecified: Secondary | ICD-10-CM | POA: Diagnosis present

## 2019-03-24 DIAGNOSIS — Z8249 Family history of ischemic heart disease and other diseases of the circulatory system: Secondary | ICD-10-CM

## 2019-03-24 DIAGNOSIS — Z6839 Body mass index (BMI) 39.0-39.9, adult: Secondary | ICD-10-CM

## 2019-03-24 LAB — CBC WITH DIFFERENTIAL/PLATELET
Abs Immature Granulocytes: 0.08 10*3/uL — ABNORMAL HIGH (ref 0.00–0.07)
Basophils Absolute: 0.1 10*3/uL (ref 0.0–0.1)
Basophils Relative: 0 %
Eosinophils Absolute: 0.1 10*3/uL (ref 0.0–0.5)
Eosinophils Relative: 1 %
HCT: 33.9 % — ABNORMAL LOW (ref 36.0–46.0)
Hemoglobin: 10.4 g/dL — ABNORMAL LOW (ref 12.0–15.0)
Immature Granulocytes: 1 %
Lymphocytes Relative: 10 %
Lymphs Abs: 1.7 10*3/uL (ref 0.7–4.0)
MCH: 25.4 pg — ABNORMAL LOW (ref 26.0–34.0)
MCHC: 30.7 g/dL (ref 30.0–36.0)
MCV: 82.7 fL (ref 80.0–100.0)
Monocytes Absolute: 1 10*3/uL (ref 0.1–1.0)
Monocytes Relative: 6 %
Neutro Abs: 14.1 10*3/uL — ABNORMAL HIGH (ref 1.7–7.7)
Neutrophils Relative %: 82 %
Platelets: 316 10*3/uL (ref 150–400)
RBC: 4.1 MIL/uL (ref 3.87–5.11)
RDW: 21.4 % — ABNORMAL HIGH (ref 11.5–15.5)
WBC: 17 10*3/uL — ABNORMAL HIGH (ref 4.0–10.5)
nRBC: 0.1 % (ref 0.0–0.2)

## 2019-03-24 LAB — COMPREHENSIVE METABOLIC PANEL
ALT: 12 U/L (ref 0–44)
AST: 15 U/L (ref 15–41)
Albumin: 3.7 g/dL (ref 3.5–5.0)
Alkaline Phosphatase: 64 U/L (ref 38–126)
Anion gap: 8 (ref 5–15)
BUN: 10 mg/dL (ref 6–20)
CO2: 24 mmol/L (ref 22–32)
Calcium: 8.1 mg/dL — ABNORMAL LOW (ref 8.9–10.3)
Chloride: 106 mmol/L (ref 98–111)
Creatinine, Ser: 0.84 mg/dL (ref 0.44–1.00)
GFR calc Af Amer: >60 mL/min (ref >60)
GFR calc non Af Amer: >60 mL/min (ref >60)
Glucose, Bld: 111 mg/dL — ABNORMAL HIGH (ref 70–99)
Potassium: 3.1 mmol/L — ABNORMAL LOW (ref 3.5–5.1)
Sodium: 138 mmol/L (ref 135–145)
Total Bilirubin: 0.8 mg/dL (ref 0.3–1.2)
Total Protein: 7.3 g/dL (ref 6.5–8.1)

## 2019-03-24 LAB — BRAIN NATRIURETIC PEPTIDE: B Natriuretic Peptide: 444 pg/mL — ABNORMAL HIGH (ref 0.0–100.0)

## 2019-03-24 LAB — LACTIC ACID, PLASMA: Lactic Acid, Venous: 1.4 mmol/L (ref 0.5–1.9)

## 2019-03-24 LAB — MRSA PCR SCREENING: MRSA by PCR: NEGATIVE

## 2019-03-24 LAB — MAGNESIUM: Magnesium: 1.9 mg/dL (ref 1.7–2.4)

## 2019-03-24 LAB — SARS CORONAVIRUS 2 BY RT PCR (HOSPITAL ORDER, PERFORMED IN ~~LOC~~ HOSPITAL LAB): SARS Coronavirus 2: NEGATIVE

## 2019-03-24 MED ORDER — HYDROXYZINE PAMOATE 25 MG PO CAPS
25.0000 mg | ORAL_CAPSULE | Freq: Three times a day (TID) | ORAL | Status: DC | PRN
  Filled 2019-03-24 (×2): qty 1

## 2019-03-24 MED ORDER — FUROSEMIDE 10 MG/ML IJ SOLN
60.0000 mg | Freq: Once | INTRAMUSCULAR | Status: AC
  Administered 2019-03-24: 60 mg via INTRAVENOUS
  Filled 2019-03-24: qty 6

## 2019-03-24 MED ORDER — BUSPIRONE HCL 15 MG PO TABS
7.5000 mg | ORAL_TABLET | Freq: Three times a day (TID) | ORAL | Status: DC
  Administered 2019-03-24 – 2019-03-29 (×16): 7.5 mg via ORAL
  Filled 2019-03-24 (×16): qty 1

## 2019-03-24 MED ORDER — FUROSEMIDE 10 MG/ML IJ SOLN
40.0000 mg | Freq: Two times a day (BID) | INTRAMUSCULAR | Status: DC
  Administered 2019-03-24 – 2019-03-26 (×5): 40 mg via INTRAVENOUS
  Filled 2019-03-24 (×5): qty 4

## 2019-03-24 MED ORDER — TRAZODONE HCL 50 MG PO TABS
25.0000 mg | ORAL_TABLET | Freq: Every day | ORAL | Status: DC
  Administered 2019-03-24 – 2019-03-28 (×5): 25 mg via ORAL
  Filled 2019-03-24 (×5): qty 1

## 2019-03-24 MED ORDER — CLONAZEPAM 0.5 MG PO TABS
0.5000 mg | ORAL_TABLET | Freq: Two times a day (BID) | ORAL | Status: DC
  Administered 2019-03-24 – 2019-03-29 (×10): 0.5 mg via ORAL
  Filled 2019-03-24 (×10): qty 1

## 2019-03-24 MED ORDER — ACETAMINOPHEN 650 MG RE SUPP: 650.0000 mg | Freq: Four times a day (QID) | RECTAL | Status: DC | PRN

## 2019-03-24 MED ORDER — POTASSIUM CHLORIDE CRYS ER 20 MEQ PO TBCR
40.0000 meq | EXTENDED_RELEASE_TABLET | Freq: Every day | ORAL | Status: DC
  Administered 2019-03-24 – 2019-03-29 (×6): 40 meq via ORAL
  Filled 2019-03-24 (×6): qty 2

## 2019-03-24 MED ORDER — POTASSIUM CHLORIDE CRYS ER 20 MEQ PO TBCR
40.0000 meq | EXTENDED_RELEASE_TABLET | Freq: Once | ORAL | Status: AC
  Administered 2019-03-24: 40 meq via ORAL
  Filled 2019-03-24: qty 2

## 2019-03-24 MED ORDER — ALBUTEROL (5 MG/ML) CONTINUOUS INHALATION SOLN
10.0000 mg/h | INHALATION_SOLUTION | Freq: Once | RESPIRATORY_TRACT | Status: AC
  Administered 2019-03-24: 10 mg/h via RESPIRATORY_TRACT
  Filled 2019-03-24: qty 20

## 2019-03-24 MED ORDER — FERROUS SULFATE 325 (65 FE) MG PO TABS
325.0000 mg | ORAL_TABLET | Freq: Every day | ORAL | Status: DC
  Administered 2019-03-25 – 2019-03-29 (×5): 325 mg via ORAL
  Filled 2019-03-24 (×6): qty 1

## 2019-03-24 MED ORDER — ONDANSETRON HCL 4 MG/2ML IJ SOLN
4.0000 mg | Freq: Once | INTRAMUSCULAR | Status: AC
  Administered 2019-03-24: 4 mg via INTRAVENOUS
  Filled 2019-03-24: qty 2

## 2019-03-24 MED ORDER — QUETIAPINE FUMARATE 50 MG PO TABS
50.0000 mg | ORAL_TABLET | Freq: Every day | ORAL | Status: DC
  Administered 2019-03-24 – 2019-03-28 (×5): 50 mg via ORAL
  Filled 2019-03-24 (×5): qty 1

## 2019-03-24 MED ORDER — FOLIC ACID 1 MG PO TABS
500.0000 ug | ORAL_TABLET | Freq: Every day | ORAL | Status: DC
  Administered 2019-03-24 – 2019-03-29 (×6): 0.5 mg via ORAL
  Filled 2019-03-24 (×6): qty 1

## 2019-03-24 MED ORDER — SERTRALINE HCL 100 MG PO TABS
100.0000 mg | ORAL_TABLET | Freq: Every day | ORAL | Status: DC
  Administered 2019-03-24 – 2019-03-29 (×6): 100 mg via ORAL
  Filled 2019-03-24 (×6): qty 1

## 2019-03-24 MED ORDER — ACETAMINOPHEN 325 MG PO TABS: 650.0000 mg | ORAL_TABLET | Freq: Four times a day (QID) | ORAL | Status: DC | PRN

## 2019-03-24 MED ORDER — MAGNESIUM SULFATE 2 GM/50ML IV SOLN
2.0000 g | Freq: Once | INTRAVENOUS | Status: AC
  Administered 2019-03-24: 2 g via INTRAVENOUS
  Filled 2019-03-24: qty 50

## 2019-03-24 MED ORDER — ENOXAPARIN SODIUM 40 MG/0.4ML ~~LOC~~ SOLN
40.0000 mg | SUBCUTANEOUS | Status: DC
  Administered 2019-03-24 – 2019-03-28 (×5): 40 mg via SUBCUTANEOUS
  Filled 2019-03-24 (×5): qty 0.4

## 2019-03-24 MED ORDER — ALBUTEROL SULFATE (2.5 MG/3ML) 0.083% IN NEBU
2.5000 mg | INHALATION_SOLUTION | Freq: Four times a day (QID) | RESPIRATORY_TRACT | Status: DC | PRN
  Administered 2019-03-24: 2.5 mg via RESPIRATORY_TRACT
  Filled 2019-03-24: qty 3

## 2019-03-24 NOTE — Consult Note (Signed)
Cardiology Consultation:   Patient ID: Theresa Crawford MRN: [id number]; DOB: [birth year]  Admit date: 03/24/2019 Date of Consult: 03/24/2019  Primary Care Provider: Kerin Perna, NP Primary Cardiologist: Kate Sable, MD  Primary Electrophysiologist:  None    Patient Profile:   Theresa Crawford is a 38 y.o. female with a hx of mitral stenosis  who is being seen today for the evaluation of  Acute CHF  at the request of  Dr. Earnest Conroy .  History of Present Illness:   [name] has a history of mitral regurgitation  And is  status post mitral valve repair.   She has had several echocardiograms with moderate to severe mitral stenosis.  She also has severe pulmonary hypertension with a recent echocardiogram that showed estimated PA pressure of 88 mmHg.  She was recently seen in the hospital and had a heart catheterization and echocardiogram.  Heart cath revealed minimal coronary artery disease.  In heart catheterization the peak RV pressure was 48.   Transesophageal echo revealed a peak mitral valve gradient of 13 mmHg with a mean gradient of 8 mmHg.  She was on for about a week and then developed flash pulmonary edema.  She is admitted to the hospital and was given IV Lasix.  She has  diuresed quite well  ( 1.2 liters )  and is breathing better.  Denies any further chest pain.  She has had a cough.  She denies any fever or COVID symptoms.  COVID swab was negative on July 11 and also 3 weeks ago. She denies any nausea or vomiting.  She denies any rash.  She denies any GI symptoms.  She denies any numbness or tingling.   Heart Pathway Score:     Past Medical History:  Diagnosis Date  . Anxiety   . Arthritis    "lower back" (04/04/2018)  . Chronic bronchitis ([place])   . Chronic diastolic CHF (congestive heart failure) ([place])   . Chronic lower back pain   . DDD (degenerative disc disease), lumbar   . Depression   . GERD (gastroesophageal reflux disease)   . Headache    "weekly" (04/04/2018)  . Heart murmur   . Hypertension   . Mitral valve disease   . Normocytic anemia   . Obesity   . Palpitations   . Pericarditis   . Premature atrial contractions   . Pulmonary hypertension ([place])   . PVC's (premature ventricular contractions)    a. h/o palpitations with event monitor in 03/2017 showing NSR with PACs/PVCs.  . Recurrent mitral valve stenosis and regurgitation s/p mitral valve repair   . S/P mitral valve repair 03/27/2013   Dr. Fransico Michael - [place], [place] - complex valvuloplasty including resuspension of entire posterior leaflet using Gore-tex neochords and 30 mm Edwards Physio ring annuloplasty  . Sarcoidosis   . Tobacco abuse   . Tricuspid regurgitation     Past Surgical History:  Procedure Laterality Date  . ABDOMINAL HERNIA REPAIR  ~ 2011  . ABDOMINAL HYSTERECTOMY  2011   "partial"; PID w/problem with fallopian tubes, had left fallopian and left ovary removed, pt still having periods  . CARDIAC CATHETERIZATION  2014  . CESAREAN SECTION  2004; 2009  . HERNIA REPAIR    . MITRAL VALVE REPAIR  03/27/2013   Dr. Fransico Michael - [place], [place]. - complex valvuloplasty including resuspension of posterior leaflet with 30 mm CE Physio ring annuloplasty  . MULTIPLE EXTRACTIONS WITH ALVEOLOPLASTY N/A 04/03/2018   Procedure: Extraction of  tooth #30 with alveoloplasty and gross debridement of remaining teeth;  Surgeon: Charlynne PanderKulinski, Ronald F, DDS;  Location: [facility] OR;  Service: Oral Surgery;  Laterality: N/A;  . PARTIAL HYSTERECTOMY  2011   PID w/problem with fallopian tubes, had left fallopian and left ovary removed, pt still having periods  . RIGHT/LEFT HEART CATH AND CORONARY ANGIOGRAPHY N/A 03/05/2019   Procedure: RIGHT/LEFT HEART CATH AND CORONARY ANGIOGRAPHY;  Surgeon: Kathleene HazelMcAlhany, Christopher D, MD;  Location: [facility] INVASIVE CV LAB;  Service: Cardiovascular;  Laterality: N/A;  . TEE WITHOUT CARDIOVERSION N/A 05/26/2016   Procedure: TRANSESOPHAGEAL ECHOCARDIOGRAM  (TEE);  Surgeon: Laqueta LindenSuresh A Koneswaran, MD;  Location: [facility] ENDO SUITE;  Service: Cardiovascular;  Laterality: N/A;  . TEE WITHOUT CARDIOVERSION N/A 01/25/2018   Procedure: TRANSESOPHAGEAL ECHOCARDIOGRAM (TEE) WITH PROPOFOL;  Surgeon: Jonelle SidleMcDowell, Samuel G, MD;  Location: [facility];  Service: Cardiovascular;  Laterality: N/A;  . TEE WITHOUT CARDIOVERSION N/A 03/05/2019   Procedure: TRANSESOPHAGEAL ECHOCARDIOGRAM (TEE);  Surgeon: Pricilla Riffleoss, Paula V, MD;  [facility];  Service: Cardiovascular;  Laterality: N/A;     Home Medications:  Prior to Admission medications   Medication Sig Start Date End Date Taking? Authorizing Provider  albuterol (PROVENTIL HFA;VENTOLIN HFA) 108 (90 Base) MCG/ACT inhaler Inhale 2 puffs into the lungs every 6 (six) hours as needed for wheezing or shortness of breath.    [provider]  busPIRone (BUSPAR) 7.5 MG tablet Take 1 tablet (7.5 mg total) by mouth 3 (three) times daily. 03/08/19   Almon HerculesGonfa, Taye T, MD  clonazePAM (KLONOPIN) 0.5 MG tablet Take 1 tablet (0.5 mg total) by mouth 2 (two) times daily. 03/08/19   Almon HerculesGonfa, Taye T, MD  docusate sodium (COLACE) 100 MG capsule Take 100 mg by mouth daily as needed for moderate constipation.     [provider]  EPINEPHrine (EPIPEN 2-PAK) 0.3 mg/0.3 mL IJ SOAJ injection Inject 0.3 Units into the muscle once.     [provider]  ferrous sulfate 325 (65 FE) MG tablet Take 1 tablet (325 mg total) by mouth 2 (two) times daily with a meal. Patient taking differently: Take 325 mg by mouth daily with breakfast.  03/08/18    AspenHernandez Acosta, Limmie PatriciaEstela Y, MD  folic acid (FOLVITE) 800 MCG tablet Take 400 mcg by mouth daily.    [provider]  furosemide (LASIX) 40 MG tablet Take 1 tablet (40 mg total) by mouth daily. 03/09/19   Almon HerculesGonfa, Taye T, MD  hydrOXYzine (VISTARIL) 25 MG capsule Take 1 capsule (25 mg total) by mouth 3 (three) times daily as needed. 03/19/19   Grayce SessionsEdwards, Michelle P, NP  metoprolol succinate  (TOPROL-XL) 25 MG 24 hr tablet Take 1.5 tablets (37.5 mg total) by mouth daily. 02/28/19   Laqueta LindenKoneswaran, Suresh A, MD  potassium chloride SA (K-DUR) 20 MEQ tablet Take 2 tablets (40 mEq total) by mouth daily. Take 2 tablets daily 02/28/19   Laqueta LindenKoneswaran, Suresh A, MD  Probiotic Product (PROBIOTIC PO) Take 1 capsule by mouth daily.    [provider]  QUEtiapine (SEROQUEL) 50 MG tablet Take 1 tablet (50 mg total) by mouth at bedtime. 04/15/18   Oneta RackLewis, Tanika N, NP  sertraline (ZOLOFT) 100 MG tablet Take 1 tablet (100 mg total) by mouth daily. 03/19/19   Grayce SessionsEdwards, Michelle P, NP  traZODone (DESYREL) 50 MG tablet Take 0.5 tablets (25 mg total) by mouth at bedtime. Patient not taking: Reported on 03/19/2019 04/15/18   Oneta RackLewis, Tanika N, NP  vitamin B-12 (CYANOCOBALAMIN) 1000 MCG tablet Take 1,000  mcg by mouth daily.    [provider]    Inpatient Medications: Scheduled Meds: . busPIRone  7.5 mg Oral TID  . clonazePAM  0.5 mg Oral BID  . enoxaparin (LOVENOX) injection  40 mg Subcutaneous Q24H  . [START ON 03/25/2019] ferrous sulfate  325 mg Oral Q breakfast  . folic acid  500 mcg Oral Daily  . furosemide  40 mg Intravenous BID  . potassium chloride SA  40 mEq Oral Daily  . QUEtiapine  50 mg Oral QHS  . sertraline  100 mg Oral Daily  . traZODone  25 mg Oral QHS   Continuous Infusions:  PRN Meds: acetaminophen **OR** acetaminophen, albuterol, hydrOXYzine  Allergies:    Allergies  Allergen Reactions  . Bee Venom Anaphylaxis  . Lisinopril Shortness Of Breath, Swelling and Other (See Comments)    Throat swelling  . Penicillins Shortness Of Breath, Swelling and Other (See Comments)    Has patient had a PCN reaction causing immediate rash, facial/tongue/throat swelling, SOB or lightheadedness with hypotension: Yes Has patient had a PCN reaction causing severe rash involving mucus membranes or skin necrosis: Yes Has patient had a PCN reaction that required hospitalization No Has patient had  a PCN reaction occurring within the last 10 years: No If all of the above answers are "NO", then may proceed with Cephalosporin use.   Marland Kitchen. Perflutren Lipid Microsphere Shortness Of Breath and Other (See Comments)    Other reaction(s): Chest Pain/Tightness  . Shellfish Allergy Anaphylaxis  . Contrast Media [Iodinated Diagnostic Agents] Hives, Itching and Other (See Comments)    Itching and hives to throat, neck, face and arms.   No difficulty breathing.   [facility] 2015. Contrast Dye    Social History:   Social History   Socioeconomic History  . Marital status: Single    Spouse name: Not on file  . Number of children: 2  . Years of education: Not on file  . Highest education level: Not on file  Occupational History  . Not on file  Social Needs  . Financial resource strain: Not on file  . Food insecurity    Worry: Not on file    Inability: Not on file  . Transportation needs    Medical: Not on file    Non-medical: Not on file  Tobacco Use  . Smoking status: Current Some Day Smoker    Packs/day: 0.25    Years: 24.00    Pack years: 6.00    Types: Cigarettes    Start date: 11/03/1999  . Smokeless tobacco: Never Used  Substance and Sexual Activity  . Alcohol use: Yes    Comment: 04/04/2018 "1-2 beers/month; if that"  . Drug use: Not Currently  . Sexual activity: Not Currently  Lifestyle  . Physical activity    Days per week: Not on file    Minutes per session: Not on file  . Stress: Not on file  Relationships  . Social Musicianconnections    Talks on phone: Not on file    Gets together: Not on file    Attends religious service: Not on file    Active member of club or organization: Not on file    Attends meetings of clubs or organizations: Not on file    Relationship status: Not on file  . Intimate partner violence    Fear of current or ex partner: Not on file    Emotionally abused: Not on file  Physically abused: Not on file     Forced sexual activity: Not on file  Other Topics Concern  . Not on file  Social History Narrative  . Not on file    Family History:    Family History  Problem Relation Age of Onset  . Heart failure Father        just received LVAD  . Heart disease Paternal Grandfather        stent placement     ROS:  Please see the history of present illness.   All other ROS reviewed and negative.     Physical Exam/Data:   Vitals:   03/24/19 0859 03/24/19 0900 03/24/19 1049 03/24/19 1059  BP: 100/67 107/65 97/67   Pulse: 85 84 78   Resp: (!) 23 (!) 24 (!) 22   Temp: 98.5 F (36.9 C)  98.1 F (36.7 C)   TempSrc: Oral  Oral   SpO2: 100% 100% 100% 100%  Weight:      Height:        Intake/Output Summary (Last 24 hours) at 03/24/2019 1137 Last data filed at 03/24/2019 0644 Gross per 24 hour  Intake -  Output 1200 ml  Net -1200 ml   Last 3 Weights 03/24/2019 03/19/2019 03/08/2019  Weight (lbs) 223 lb 219 lb 224 lb  Weight (kg) 101.152 kg 99.338 kg 101.606 kg  Some encounter information is confidential and restricted. Go to Review Flowsheets activity to see all data.     Body mass index is 38.28 kg/m.  General:   Young female, no acute distress HEENT: normal Lymph: no adenopathy Neck: no JVD Endocrine:  No thryomegaly Vascular: No carotid bruits; FA pulses 2+ bilaterally without bruits  Cardiac:  normal S1, S2; RRR; there is soft diastolic murmur and soft systolic murmur. Lungs:  clear to auscultation bilaterally, no wheezing, rhonchi or rales  Abd: soft, nontender, no hepatomegaly  Ext: no edema Musculoskeletal:  No deformities, BUE and BLE strength normal and equal Skin: warm and dry  Neuro:  CNs 2-12 intact, no focal abnormalities noted Psych:  Normal affect   EKG:  The EKG was personally reviewed and demonstrates:   Sinus rhythm with PVCs.  She is had bigeminy. Telemetry:  Telemetry was personally reviewed and demonstrates: Sinus rhythm   Relevant CV Studies:     Laboratory Data:  High Sensitivity Troponin:  No results for input(s): TROPONINIHS in the last 720 hours.   Cardiac EnzymesNo results for input(s): TROPONINI in the last 168 hours. No results for input(s): TROPIPOC in the last 168 hours.  Chemistry Recent Labs  Lab 03/24/19 0400  NA 138  K 3.1*  CL 106  CO2 24  GLUCOSE 111*  BUN 10  CREATININE 0.84  CALCIUM 8.1*  GFRNONAA >60  GFRAA >60  ANIONGAP 8    Recent Labs  Lab 03/24/19 0400  PROT 7.3  ALBUMIN 3.7  AST 15  ALT 12  ALKPHOS 64  BILITOT 0.8   Hematology Recent Labs  Lab 03/24/19 0400  WBC 17.0*  RBC 4.10  HGB 10.4*  HCT 33.9*  MCV 82.7  MCH 25.4*  MCHC 30.7  RDW 21.4*  PLT 316   BNP Recent Labs  Lab 03/24/19 0326  BNP 444.0*    DDimer No results for input(s): DDIMER in the last 168 hours.   Radiology/Studies:  Dg Chest Port 1 View  Result Date: 03/24/2019 CLINICAL DATA:  Shortness of breath EXAM: PORTABLE CHEST 1 VIEW COMPARISON:  03/03/2019, 11/06/2018 FINDINGS: Post sternotomy  changes and valve prosthesis. Cardiomegaly with vascular congestion. Interim worsening of diffuse bilateral interstitial and ground-glass opacity. No pleural effusion. No pneumothorax. IMPRESSION: Cardiomegaly with vascular congestion and interval worsening of diffuse bilateral interstitial and ground-glass opacity, suspicious for pulmonary edema. Electronically Signed   By: Jasmine PangKim  Fujinaga M.D.   On: 03/24/2019 03:58    Assessment and Plan:   1. 1.  Flash pulmonary edema: This is likely due to her mitral stenosis.  She also has pulmonary hypertension that may be also partially due to her sarcoidosis.  She is breathing much better following diuretic.  Continue to diurese her.   She will need increased dose of diuretics.  Will discuss with the surgical team.      For questions or updates, please contact [facility] Please consult [email or web address] for contact info under     Signed, Kristeen MissPhilip Majid Mccravy, MD  03/24/2019 11:37  AM

## 2019-03-24 NOTE — ED Notes (Signed)
DR Olevia Bowens at bedside

## 2019-03-24 NOTE — ED Notes (Signed)
Pt switched to high flow o2 at 10 L/via [place]. Sats 100% at this time

## 2019-03-24 NOTE — Progress Notes (Signed)
New pt admission from ED [facility]. Pt brought to the floor in stable condition. Vitals taken. Initial Assessment done. All immediate pertinent needs to patient addressed. Patient Guide given to patient. Important safety instructions relating to hospitalization reviewed with patient. Patient verbalized understanding. Pt is getting oxygen @10L  via [facility]  Will continue to monitor pt.  Palma Holter, RN

## 2019-03-24 NOTE — ED Triage Notes (Signed)
Pt in by ems for sob/resp distress.  Pt told ems that this felt like her sarcoidosis. Pt in with 6 liters O2 with nrb mask, cough.

## 2019-03-24 NOTE — Plan of Care (Signed)

## 2019-03-24 NOTE — Progress Notes (Signed)
Please see H&P from this morning.  38 year old female with history of hypertension, anxiety/depression, chronic back pain, GERD, sarcoidosis and severe valvular heart disease (mitral valve stenosis status post repair, tricuspid regurgitation) with associated pulmonary hypertension who was recently admitted to this [facility] in June for diastolic CHF secondary to mitral valve restenosis and discharged on diuretics with instructions to follow-up CT surgery as outpatient, now presents back with shortness of breath and noted to be in flash pulmonary edema/acute respiratory failure requiring BiPAP/high flow O2.  Patient transferred from [facility] on 10 L high flow O2.  Patient seen and examined on arrival.  She appeared to be sleeping/laying flat with no respiratory distress and saturating well on high flow O2.  Continue IV Lasix 40 mg twice daily for now with daily weights/close monitoring of input/output.  Taper O2 as tolerated.  Requested cardiology to evaluate patient and recommend further.

## 2019-03-24 NOTE — ED Provider Notes (Signed)
[facility] EMERGENCY DEPARTMENT Provider Note   CSN: [id number] Arrival date & time: 03/24/19  0320  Time seen 3:18 AM on arrival  History   Chief Complaint Chief Complaint  Patient presents with   Respiratory Distress    HPI Theresa Crawford is a 38 y.o. female.   Level 5 caveat for respiratory distress  HPI patient has a history of congestive heart failure, presumed sarcoidosis, mitral valve disease who is supposed to have surgery soon.  She reports over the last couple days she has been getting more short of breath.  She feels like she has been having some wheezing.  She denies any swelling of her extremities.  EMS was called tonight for respiratory distress.  They report her initial pulse ox was 88% on room air.  She presents of a nonrebreather mask.  They did not give her any treatment.  Patient indicates she is having some foamy pinkish mucus production.  She denies any fever.  She states she is still smoking.  She states she feels like she has been wheezing at home.  PCP Grayce SessionsEdwards, Michelle P, NP Cardiologist Dr. Purvis SheffieldKoneswaran  Past Medical History:  Diagnosis Date   Anxiety    Arthritis    "lower back" (04/04/2018)   Chronic bronchitis (HCC)    Chronic diastolic CHF (congestive heart failure) (HCC)    Chronic lower back pain    DDD (degenerative disc disease), lumbar    Depression    GERD (gastroesophageal reflux disease)    Headache    "weekly" (04/04/2018)   Heart murmur    Hypertension    Mitral valve disease    Normocytic anemia    Obesity    Palpitations    Pericarditis    Premature atrial contractions    Pulmonary hypertension (HCC)    PVC's (premature ventricular contractions)    a. h/o palpitations with event monitor in 03/2017 showing NSR with PACs/PVCs.   Recurrent mitral valve stenosis and regurgitation s/p mitral valve repair    S/P mitral valve repair 03/27/2013   [place] valvuloplasty including  resuspension of entire posterior leaflet using Gore-tex neochords and 30 mm Edwards Physio ring annuloplasty   Sarcoidosis    Tobacco abuse    Tricuspid regurgitation     Patient Active Problem List   Diagnosis Date Noted   Dysuria 03/19/2019   Acute CHF (congestive heart failure) (HCC) 03/04/2019   MDD (major depressive disorder), recurrent episode (HCC) 04/05/2018   S/P tooth extraction 04/03/2018   Suicidal ideation 04/03/2018   MDD (major depressive disorder)    Chronic diastolic congestive heart failure (HCC)    Tricuspid regurgitation    Hypomagnesemia 03/07/2018   Tobacco abuse    Dyspnea 01/11/2018   Prolonged QT interval 09/10/2017   Normochromic normocytic anemia    Pulmonary edema 07/05/2017   Hypokalemia 06/12/2017   Pulmonary hypertension (HCC) 06/12/2017   Flash pulmonary edema (HCC) 06/11/2017   Depression with anxiety 06/11/2017   Recurrent mitral valve stenosis and regurgitation s/p mitral valve repair    Acute pulmonary edema (HCC) 10/10/2016   CAP (community acquired pneumonia) 05/25/2016   Leukocytosis 05/24/2016   Acute respiratory failure with hypoxia (HCC) 05/23/2016   Acute on chronic diastolic CHF (congestive heart failure) (HCC) 05/23/2016   Cough with hemoptysis 05/23/2016   Postpartum complication pericarditis in 2009 with eventual needing MVR in 2015 05/23/2016   Anemia, iron deficiency 05/23/2016   Hypertension    SOB (shortness of breath)  Respiratory distress 02/16/2016   Essential hypertension 02/16/2016   S/P mitral valve repair 03/27/2013    Past Surgical History:  Procedure Laterality Date   ABDOMINAL HERNIA REPAIR  ~ 2011   ABDOMINAL HYSTERECTOMY  2011   "partial"; PID w/problem with fallopian tubes, had left fallopian and left ovary removed, pt still having periods   CARDIAC CATHETERIZATION  2014   CESAREAN SECTION  2004; 2009   HERNIA REPAIR     MITRAL VALVE REPAIR  03/27/2013   Dr.  Darcus [place] - complex valvuloplasty including resuspension of posterior leaflet with 30 mm CE Physio ring annuloplasty   MULTIPLE EXTRACTIONS WITH ALVEOLOPLASTY N/A 04/03/2018   Procedure: Extraction of tooth #30 with alveoloplasty and gross debridement of remaining teeth;  Surgeon: Charlynne Pander, DDS;  Location: [facility] OR;  Service: Oral Surgery;  Laterality: N/A;   PARTIAL HYSTERECTOMY  2011   PID w/problem with fallopian tubes, had left fallopian and left ovary removed, pt still having periods   RIGHT/LEFT HEART CATH AND CORONARY ANGIOGRAPHY N/A 03/05/2019   Procedure: RIGHT/LEFT HEART CATH AND CORONARY ANGIOGRAPHY;  Surgeon: Kathleene Hazel, MD;  Location: [facility] INVASIVE CV LAB;  Service: Cardiovascular;  Laterality: N/A;   TEE WITHOUT CARDIOVERSION N/A 05/26/2016   Procedure: TRANSESOPHAGEAL ECHOCARDIOGRAM (TEE);  Surgeon: [name], MD;  Location: [facility] ENDO SUITE;  Service: Cardiovascular;  Laterality: N/A;   TEE WITHOUT CARDIOVERSION N/A 01/25/2018   Procedure: TRANSESOPHAGEAL ECHOCARDIOGRAM (TEE) WITH PROPOFOL;  Surgeon: Jonelle Sidle, MD;  Location: [facility] ENDO SUITE;  Service: Cardiovascular;  Laterality: N/A;   TEE WITHOUT CARDIOVERSION N/A 03/05/2019   Procedure: TRANSESOPHAGEAL ECHOCARDIOGRAM (TEE);  Surgeon: Pricilla Riffle, MD;  Location: [facility] ENDOSCOPY;  Service: Cardiovascular;  Laterality: N/A;     OB History    Gravida  2   Para  2   Term  2   Preterm      AB      Living        SAB      TAB      Ectopic      Multiple      Live Births               Home Medications    Prior to Admission medications   Medication Sig Start Date End Date Taking? Authorizing Provider  albuterol (PROVENTIL HFA;VENTOLIN HFA) 108 (90 Base) MCG/ACT inhaler Inhale 2 puffs into the lungs every 6 (six) hours as needed for wheezing or shortness of breath.    [provider]  busPIRone (BUSPAR) 7.5 MG tablet Take 1 tablet (7.5 mg total)  by mouth 3 (three) times daily. 03/08/19   Almon Hercules, MD  clonazePAM (KLONOPIN) 0.5 MG tablet Take 1 tablet (0.5 mg total) by mouth 2 (two) times daily. 03/08/19   Almon Hercules, MD  docusate sodium (COLACE) 100 MG capsule Take 100 mg by mouth daily as needed for moderate constipation.     [provider]  EPINEPHrine (EPIPEN 2-PAK) 0.3 mg/0.3 mL IJ SOAJ injection Inject 0.3 Units into the muscle once.     [provider]  ferrous sulfate 325 (65 FE) MG tablet Take 1 tablet (325 mg total) by mouth 2 (two) times daily with a meal. Patient taking differently: Take 325 mg by mouth daily with breakfast.  03/08/18   Philip Aspen, Limmie Patricia, MD  folic acid (FOLVITE) 800 MCG tablet Take 400 mcg by mouth daily.    [provider]  furosemide (LASIX) 40 MG tablet Take 1 tablet (40 mg total) by mouth daily. 03/09/19   Mercy Riding, MD  hydrOXYzine (VISTARIL) 25 MG capsule Take 1 capsule (25 mg total) by mouth 3 (three) times daily as needed. 03/19/19   Kerin Perna, NP  metoprolol succinate (TOPROL-XL) 25 MG 24 hr tablet Take 1.5 tablets (37.5 mg total) by mouth daily. 02/28/19   Herminio Commons, MD  potassium chloride SA (K-DUR) 20 MEQ tablet Take 2 tablets (40 mEq total) by mouth daily. Take 2 tablets daily 02/28/19   Herminio Commons, MD  Probiotic Product (PROBIOTIC PO) Take 1 capsule by mouth daily.    [provider]  QUEtiapine (SEROQUEL) 50 MG tablet Take 1 tablet (50 mg total) by mouth at bedtime. 04/15/18   Derrill Center, NP  sertraline (ZOLOFT) 100 MG tablet Take 1 tablet (100 mg total) by mouth daily. 03/19/19   Kerin Perna, NP  traZODone (DESYREL) 50 MG tablet Take 0.5 tablets (25 mg total) by mouth at bedtime. Patient not taking: Reported on 03/19/2019 04/15/18   Derrill Center, NP  vitamin B-12 (CYANOCOBALAMIN) 1000 MCG tablet Take 1,000 mcg by mouth daily.    [provider]    Family History Family History  Problem Relation  Age of Onset   Heart failure Father        just received LVAD   Heart disease Paternal Grandfather        stent placement    Social History Social History   Tobacco Use   Smoking status: Current Some Day Smoker    Packs/day: 0.25    Years: 24.00    Pack years: 6.00    Types: Cigarettes    Start date: 11/03/1999   Smokeless tobacco: Never Used  Substance Use Topics   Alcohol use: Yes    Comment: 04/04/2018 "1-2 beers/month; if that"   Drug use: Not Currently     Allergies   Bee venom, Lisinopril, Penicillins, Perflutren lipid microsphere, Shellfish allergy, and Contrast media [iodinated diagnostic agents]   Review of Systems Review of Systems  Unable to perform ROS: Severe respiratory distress  All other systems reviewed and are negative.    Physical Exam Updated Vital Signs BP 113/77    Pulse (!) 106    Temp 99.7 F (37.6 C)    Resp (!) 47    Ht 5\' 4"  (1.626 m)    Wt 101.2 kg    SpO2 96%    BMI 38.28 kg/m    Laboratory interpretation all normal except tachycardia, tachypnea   Physical Exam Vitals signs and nursing note reviewed.  Constitutional:      General: She is in acute distress.  HENT:     Head: Normocephalic and atraumatic.     Right Ear: External ear normal.     Left Ear: External ear normal.     Nose: Nose normal.     Mouth/Throat:     Comments: Patient has a nonrebreather mask in place Eyes:     Extraocular Movements: Extraocular movements intact.     Conjunctiva/sclera: Conjunctivae normal.     Pupils: Pupils are equal, round, and reactive to light.  Neck:     Musculoskeletal: Normal range of motion and neck supple.  Cardiovascular:     Rate and Rhythm: Regular rhythm. Tachycardia present.     Pulses: Normal pulses.     Heart sounds: Normal heart sounds.  Pulmonary:     Effort: Tachypnea, accessory muscle  usage, prolonged expiration, respiratory distress and retractions present.     Breath sounds: Decreased air movement present.    Musculoskeletal: Normal range of motion.        General: No swelling.  Skin:    General: Skin is warm and dry.     Capillary Refill: Capillary refill takes less than 2 seconds.  Neurological:     General: No focal deficit present.     Mental Status: She is alert and oriented to person, place, and time.     Cranial Nerves: No cranial nerve deficit.  Psychiatric:        Mood and Affect: Mood is anxious.        Speech: Speech is rapid and pressured.        Behavior: Behavior is agitated.      ED Treatments / Results  Labs (all labs ordered are listed, but only abnormal results are displayed) Results for orders placed or performed during the hospital encounter of 03/24/19  SARS Coronavirus 2 (CEPHEID - Performed in [facility] lab), [facility]   Specimen: Nasopharyngeal Swab  Result Value Ref Range   SARS Coronavirus 2 NEGATIVE NEGATIVE  Comprehensive metabolic panel  Result Value Ref Range   Sodium 138 135 - 145 mmol/L   Potassium 3.1 (L) 3.5 - 5.1 mmol/L   Chloride 106 98 - 111 mmol/L   CO2 24 22 - 32 mmol/L   Glucose, Bld 111 (H) 70 - 99 mg/dL   BUN 10 6 - 20 mg/dL   Creatinine, Ser 4.090.84 0.44 - 1.00 mg/dL   Calcium 8.1 (L) 8.9 - 10.3 mg/dL   Total Protein 7.3 6.5 - 8.1 g/dL   Albumin 3.7 3.5 - 5.0 g/dL   AST 15 15 - 41 U/L   ALT 12 0 - 44 U/L   Alkaline Phosphatase 64 38 - 126 U/L   Total Bilirubin 0.8 0.3 - 1.2 mg/dL   GFR calc non Af Amer >60 >60 mL/min   GFR calc Af Amer >60 >60 mL/min   Anion gap 8 5 - 15  Brain natriuretic peptide  Result Value Ref Range   B Natriuretic Peptide 444.0 (H) 0.0 - 100.0 pg/mL  CBC with Differential  Result Value Ref Range   WBC 17.0 (H) 4.0 - 10.5 K/uL   RBC 4.10 3.87 - 5.11 MIL/uL   Hemoglobin 10.4 (L) 12.0 - 15.0 g/dL   HCT 81.133.9 (L) 91.436.0 - 78.246.0 %   MCV 82.7 80.0 - 100.0 fL   MCH 25.4 (L) 26.0 - 34.0 pg   MCHC 30.7 30.0 - 36.0 g/dL   RDW 95.621.4 (H) 21.311.5 - 08.615.5 %   Platelets 316 150 - 400 K/uL   nRBC 0.1 0.0 - 0.2 %    Neutrophils Relative % 82 %   Neutro Abs 14.1 (H) 1.7 - 7.7 K/uL   Lymphocytes Relative 10 %   Lymphs Abs 1.7 0.7 - 4.0 K/uL   Monocytes Relative 6 %   Monocytes Absolute 1.0 0.1 - 1.0 K/uL   Eosinophils Relative 1 %   Eosinophils Absolute 0.1 0.0 - 0.5 K/uL   Basophils Relative 0 %   Basophils Absolute 0.1 0.0 - 0.1 K/uL   Immature Granulocytes 1 %   Abs Immature Granulocytes 0.08 (H) 0.00 - 0.07 K/uL  Lactic acid, plasma  Result Value Ref Range   Lactic Acid, Venous 1.4 0.5 - 1.9 mmol/L    Laboratory interpretation all normal except hypokalemia, stable elevation of BNP, stable anemia,  leukocytosis     EKG ED ECG REPORT #1   Date: 03/24/2019  Rate: 106  Rhythm: sinus tachycardia  QRS Axis: left  Intervals: normal  ST/T Wave abnormalities: nonspecific ST/T changes  Conduction Disutrbances:none  Narrative Interpretation: LAE  Old EKG Reviewed: none available  I have personally reviewed the EKG tracing and agree with the computerized printout as noted.  ED ECG REPORT #2   Date: 03/24/2019  Rate: 124  Rhythm: Ventricular bigeminy  QRS Axis: left  Intervals: normal  ST/T Wave abnormalities: nonspecific ST/T changes  Conduction Disutrbances:none  Narrative Interpretation:   Old EKG Reviewed: changes noted from earlier today  I have personally reviewed the EKG tracing and agree with the computerized printout as noted.   Radiology Dg Chest Port 1 View  Result Date: 03/24/2019 CLINICAL DATA:  Shortness of breath EXAM: PORTABLE CHEST 1 VIEW COMPARISON:  03/03/2019, 11/06/2018 FINDINGS: Post sternotomy changes and valve prosthesis. Cardiomegaly with vascular congestion. Interim worsening of diffuse bilateral interstitial and ground-glass opacity. No pleural effusion. No pneumothorax. IMPRESSION: Cardiomegaly with vascular congestion and interval worsening of diffuse bilateral interstitial and ground-glass opacity, suspicious for pulmonary edema. Electronically Signed    By: Jasmine Pang M.D.   On: 03/24/2019 03:58     Mr Cardiac Morphology W Wo Contrast  Result Date: 03/08/2019 CLINICAL DATA:  Query delayed myocardial enhancement, query sarcoidosis.  IMPRESSION: 1. Normal left ventricular chamber size and function, LVEF 54%. Septal flattening in diastole suggests RV volume overload. 2. No delayed myocardial enhancement seen, no findings to suggests cardiomyopathic process, infarct, or infiltrative disease. 3. Mild right ventricular enlargement with mildly reduced right ventricular function, RVEF 35%. 4.  Moderate left atrial enlargement. 5. Artifact from mitral annuloplasty ring noted with thickened mitral valve leaflets and visually estimated mild mitral valve regurgitation. Electronically Signed   By: Weston Brass   On: 03/08/2019 11:47   Procedures .Critical Care Performed by: Devoria Albe, MD Authorized by: Devoria Albe, MD   Critical care provider statement:    Critical care time (minutes):  39   Critical care was necessary to treat or prevent imminent or life-threatening deterioration of the following conditions:  Respiratory failure   Critical care was time spent personally by me on the following activities:  Discussions with consultants, evaluation of patient's response to treatment, examination of patient, obtaining history from patient or surrogate, ordering and review of laboratory studies, ordering and review of radiographic studies, pulse oximetry, re-evaluation of patient's condition and review of old charts   (including critical care time)  Medications Ordered in ED Medications  potassium chloride SA (K-DUR) CR tablet 40 mEq (has no administration in time range)  magnesium sulfate IVPB 2 g 50 mL (has no administration in time range)  albuterol (PROVENTIL,VENTOLIN) solution continuous neb (10 mg/hr Nebulization Given 03/24/19 0337)  furosemide (LASIX) injection 60 mg (60 mg Intravenous Given 03/24/19 0338)  ondansetron (ZOFRAN) injection 4 mg  (4 mg Intravenous Given 03/24/19 0353)     Initial Impression / Assessment and Plan / ED Course  I have reviewed the triage vital signs and the nursing notes.  Pertinent labs & imaging results that were available during my care of the patient were reviewed by me and considered in my medical decision making (see chart for details).      Patient was placed on BiPAP, she was started on a albuterol 10 mg continuous nebulizer.  After reviewing her chart she was given Lasix 60 mg IV.  She was admitted in mid  June with similar presentation and was felt to have exacerbation of congestive heart failure.  Recheck at 3:40 AM patient seems much calmer, she indicates she is breathing better.  She states she has an appointment with Dr. Cornelius Moraswen, cardiothoracic surgeon on Monday, July 13 to discuss her mitral valve surgery.  4:10 AM patient is sleeping peacefully.  4:18 AM nurse reports when they were inserting the pure wick she had a short run of ventricular bigeminy.  It is now resolved.  Nurse reports intermittent episodes of ventricular bigeminy.  Her lab work has returned and she is hypokalemic.  Oral potassium was ordered.  Magnesium level was ordered to her blood work.  The hypokalemia may explain the bigeminy.  Consideration was made for PE however she has persistently elevated D-dimers, the last was in February and her CTA at that time was without PE.  5:20 AM Dr. Robb Matarrtiz, hospitalist will admit  Final Clinical Impressions(s) / ED Diagnoses   Final diagnoses:  Hypoxia  Acute on chronic congestive heart failure, unspecified heart failure type (HCC)  Bronchospasm  Sarcoidosis    Plan admission  Devoria AlbeIva Youa Deloney, MD, Concha PyoFACEP    Joanette Silveria, MD 03/24/19 [phone]

## 2019-03-24 NOTE — Progress Notes (Signed)
Patient taken off BIPAP and placed on 10L HFNC. Sats 100% BIPAP on standby at bedside.

## 2019-03-24 NOTE — ED Notes (Signed)
ED TO INPATIENT HANDOFF REPORT  ED Nurse Name and Phone #: Zella BallRobin [phone]  S Name/Age/Gender Theresa Crawford 38 y.o. female Room/Bed: APA02/APA02  Code Status   Code Status: Full Code  Home/SNF/Other Home Patient oriented to: self, place, time and situation Is this baseline? Yes   Triage Complete: Triage complete  Chief Complaint respiratory distress  Triage Note Pt in by ems for sob/resp distress.  Pt told ems that this felt like her sarcoidosis. Pt in with 6 liters O2 with nrb mask, cough.   Allergies Allergies  Allergen Reactions  . Bee Venom Anaphylaxis  . Lisinopril Shortness Of Breath, Swelling and Other (See Comments)    Throat swelling  . Penicillins Shortness Of Breath, Swelling and Other (See Comments)    Has patient had a PCN reaction causing immediate rash, facial/tongue/throat swelling, SOB or lightheadedness with hypotension: Yes Has patient had a PCN reaction causing severe rash involving mucus membranes or skin necrosis: Yes Has patient had a PCN reaction that required hospitalization No Has patient had a PCN reaction occurring within the last 10 years: No If all of the above answers are "NO", then may proceed with Cephalosporin use.   Marland Kitchen. Perflutren Lipid Microsphere Shortness Of Breath and Other (See Comments)    Other reaction(s): Chest Pain/Tightness  . Shellfish Allergy Anaphylaxis  . Contrast Media [Iodinated Diagnostic Agents] Hives, Itching and Other (See Comments)    Itching and hives to throat, neck, face and arms.   No difficulty breathing.   This was given [facility] approximately 2015. Contrast Dye    Level of Care/Admitting Diagnosis ED Disposition    ED Disposition Condition Comment   Admit  [facility] [[id number]]  Level of Care: Progressive [102]  Covid Evaluation: Confirmed COVID Negative  Diagnosis: Flash pulmonary edema [facility](HCC) [[id number]]  Admitting Physician: Bobette MoTIZ, DAVID MANUEL  [[id number]]  Attending Physician: Bobette MoORTIZ, DAVID MANUEL [[id number]]  Estimated length of stay: past midnight tomorrow  Certification:: I certify this patient will need inpatient services for at least 2 midnights  PT Class (Do Not Modify): Inpatient [101]  PT Acc Code (Do Not Modify): Private [1]       B Medical/Surgery History Past Medical History:  Diagnosis Date  . Anxiety   . Arthritis    "lower back" (04/04/2018)  . Chronic bronchitis (HCC)   . Chronic diastolic CHF (congestive heart failure) (HCC)   . Chronic lower back pain   . DDD (degenerative disc disease), lumbar   . Depression   . GERD (gastroesophageal reflux disease)   . Headache    "weekly" (04/04/2018)  . Heart murmur   . Hypertension   . Mitral valve disease   . Normocytic anemia   . Obesity   . Palpitations   . Pericarditis   . Premature atrial contractions   . Pulmonary hypertension (HCC)   . PVC's (premature ventricular contractions)    a. h/o palpitations with event monitor in 03/2017 showing NSR with PACs/PVCs.  . Recurrent mitral valve stenosis and regurgitation s/p mitral valve repair   . S/P mitral valve repair 03/27/2013   [place]. Darcus [place] complex valvuloplasty including resuspension of entire posterior leaflet using Gore-tex neochords and 30 mm Edwards Physio ring annuloplasty  . Sarcoidosis   . Tobacco abuse   . Tricuspid regurgitation    Past Surgical History:  Procedure Laterality Date  . ABDOMINAL HERNIA REPAIR  ~ 2011  . ABDOMINAL HYSTERECTOMY  2011   "  partial"; PID w/problem with fallopian tubes, had left fallopian and left ovary removed, pt still having periods  . CARDIAC CATHETERIZATION  2014  . CESAREAN SECTION  2004; 2009  . HERNIA REPAIR    . MITRAL VALVE REPAIR  03/27/2013   Dr. Darcus AustinMark [place] - complex valvuloplasty including resuspension of posterior leaflet with 30 mm CE Physio ring annuloplasty  . MULTIPLE EXTRACTIONS WITH ALVEOLOPLASTY N/A  04/03/2018   Procedure: Extraction of tooth #30 with alveoloplasty and gross debridement of remaining teeth;  Surgeon: Charlynne PanderKulinski, Ronald F, DDS;  [facility];  Service: Oral Surgery;  Laterality: N/A;  . PARTIAL HYSTERECTOMY  2011   PID w/problem with fallopian tubes, had left fallopian and left ovary removed, pt still having periods  . RIGHT/LEFT HEART CATH AND CORONARY ANGIOGRAPHY N/A 03/05/2019   Procedure: RIGHT/LEFT HEART CATH AND CORONARY ANGIOGRAPHY;  Surgeon: Kathleene HazelMcAlhany, Christopher D, MD;  Location: [facility] INVASIVE CV LAB;  Service: Cardiovascular;  Laterality: N/A;  . TEE WITHOUT CARDIOVERSION N/A 05/26/2016   Procedure: TRANSESOPHAGEAL ECHOCARDIOGRAM (TEE);  Surgeon: Laqueta LindenSuresh A Koneswaran, MD;  Location: [facility] ENDO SUITE;  Service: Cardiovascular;  Laterality: N/A;  . TEE WITHOUT CARDIOVERSION N/A 01/25/2018   Procedure: TRANSESOPHAGEAL ECHOCARDIOGRAM (TEE) WITH PROPOFOL;  Surgeon: Jonelle SidleMcDowell, Samuel G, MD;  Location: [facility] END[facility] SUITE;  Service: Cardiovascular;  Laterality: N/A;  . TEE WITHOUT CARDIOVERSION N/A 03/05/2019   Procedure: TRANSESOPHAGEAL ECHOCARDIOGRAM (TEE);  Surgeon: Pricilla Riffleoss, Paula V, MD;  [facility];  Service: Cardiovascular;  Laterality: N/A;     A IV Location/Drains/Wounds Patient Lines/Drains/Airways Status   Active Line/Drains/Airways    Name:   Placement date:   Placement time:   Site:   Days:   Peripheral IV 03/24/19 Right Antecubital   03/24/19    0328    Antecubital   less than 1   External Urinary Catheter   03/24/19    0408    -   less than 1   Incision (Closed) 04/03/18 Other (Comment) Right   04/03/18    1815     355          Intake/Output Last 24 hours  Intake/Output Summary (Last 24 hours) at 03/24/2019 0719 Last data filed at 03/24/2019 0644 Gross per 24 hour  Intake -  Output 1200 ml  Net -1200 ml    Labs/Imaging Results for orders placed or performed during the hospital encounter of 03/24/19 (from the past 48 hour(s))  Brain natriuretic peptide      Status: Abnormal   Collection Time: 03/24/19  3:26 AM  Result Value Ref Range   B Natriuretic Peptide 444.0 (H) 0.0 - 100.0 pg/mL    Comment: Performed at [facility], [address] [place], [place] [address]  SARS Coronavirus 2 (CEPHEID - Performed in [facility] hospital lab), Hosp Order     Status: None   Collection Time: 03/24/19  3:30 AM   Specimen: Nasopharyngeal Swab  Result Value Ref Range   SARS Coronavirus 2 NEGATIVE NEGATIVE    Comment: (NOTE) If result is NEGATIVE SARS-CoV-2 target nucleic acids are NOT DETECTED. The SARS-CoV-2 RNA is generally detectable in upper and lower  respiratory specimens during the acute phase of infection. The lowest  concentration of SARS-CoV-2 viral copies this assay can detect is 250  copies / mL. A negative result does not preclude SARS-CoV-2 infection  and should not be used as the sole basis for treatment or other  patient management decisions.  A negative result may occur with  improper specimen collection / handling, submission of specimen other  than nasopharyngeal swab, presence of viral mutation(s) within the  areas targeted by this assay, and inadequate number of viral copies  (<250 copies / mL). A negative result must be combined with clinical  observations, patient history, and epidemiological information. If result is POSITIVE SARS-CoV-2 target nucleic acids are DETECTED. The SARS-CoV-2 RNA is generally detectable in upper and lower  respiratory specimens dur ing the acute phase of infection.  Positive  results are indicative of active infection with SARS-CoV-2.  Clinical  correlation with patient history and other diagnostic information is  necessary to determine patient infection status.  Positive results do  not rule out bacterial infection or co-infection with other viruses. If result is PRESUMPTIVE POSTIVE SARS-CoV-2 nucleic acids MAY BE PRESENT.   A presumptive positive result was obtained on the submitted specimen   and confirmed on repeat testing.  While 2019 novel coronavirus  (SARS-CoV-2) nucleic acids may be present in the submitted sample  additional confirmatory testing may be necessary for epidemiological  and / or clinical management purposes  to differentiate between  SARS-CoV-2 and other Sarbecovirus currently known to infect humans.  If clinically indicated additional testing with an alternate test  methodology [phone]([id number]) is advised. The SARS-CoV-2 RNA is generally  detectable in upper and lower respiratory sp ecimens during the acute  phase of infection. The expected result is Negative. Fact Sheet for Patients:  BoilerBrush.com.cyhttps://www.fda.gov/media/136312/download Fact Sheet for Healthcare Providers: https://pope.com/https://www.fda.gov/media/136313/download This test is not yet approved or cleared by the [place] FDA and has been authorized for detection and/or diagnosis of SARS-CoV-2 by FDA under an Emergency Use Authorization (EUA).  This EUA will remain in effect (meaning this test can be used) for the duration of the COVID-19 declaration under Section 564(b)(1) of the Act, 21 U.S.C. section 360bbb-3(b)(1), unless the authorization is terminated or revoked sooner. Performed at [facility], [address] [place], [place] [address]   Comprehensive metabolic panel     Status: Abnormal   Collection Time: 03/24/19  4:00 AM  Result Value Ref Range   Sodium 138 135 - 145 mmol/L   Potassium 3.1 (L) 3.5 - 5.1 mmol/L   Chloride 106 98 - 111 mmol/L   CO2 24 22 - 32 mmol/L   Glucose, Bld 111 (H) 70 - 99 mg/dL   BUN 10 6 - 20 mg/dL   Creatinine, Ser 8.110.84 0.44 - 1.00 mg/dL   Calcium 8.1 (L) 8.9 - 10.3 mg/dL   Total Protein 7.3 6.5 - 8.1 g/dL   Albumin 3.7 3.5 - 5.0 g/dL   AST 15 15 - 41 U/L   ALT 12 0 - 44 U/L   Alkaline Phosphatase 64 38 - 126 U/L   Total Bilirubin 0.8 0.3 - 1.2 mg/dL   GFR calc non Af Amer >60 >60 mL/min   GFR calc Af Amer >60 >60 mL/min   Anion gap 8 5 - 15    Comment: Performed at  [facility], [address] [place], [place] [address]  CBC with Differential     Status: Abnormal   Collection Time: 03/24/19  4:00 AM  Result Value Ref Range   WBC 17.0 (H) 4.0 - 10.5 K/uL   RBC 4.10 3.87 - 5.11 MIL/uL   Hemoglobin 10.4 (L) 12.0 - 15.0 g/dL   HCT 29.533.9 (L) 62.136.0 - 30.846.0 %   MCV 82.7 80.0 - 100.0 fL   MCH 25.4 (L) 26.0 - 34.0 pg   MCHC  30.7 30.0 - 36.0 g/dL   RDW 21.4 (H) 11.5 - 15.5 %   Platelets 316 150 - 400 K/uL   nRBC 0.1 0.0 - 0.2 %   Neutrophils Relative % 82 %   Neutro Abs 14.1 (H) 1.7 - 7.7 K/uL   Lymphocytes Relative 10 %   Lymphs Abs 1.7 0.7 - 4.0 K/uL   Monocytes Relative 6 %   Monocytes Absolute 1.0 0.1 - 1.0 K/uL   Eosinophils Relative 1 %   Eosinophils Absolute 0.1 0.0 - 0.5 K/uL   Basophils Relative 0 %   Basophils Absolute 0.1 0.0 - 0.1 K/uL   Immature Granulocytes 1 %   Abs Immature Granulocytes 0.08 (H) 0.00 - 0.07 K/uL    Comment: Performed at [facility], [address] [place], [place] [address]  Lactic acid, plasma     Status: None   Collection Time: 03/24/19  4:00 AM  Result Value Ref Range   Lactic Acid, Venous 1.4 0.5 - 1.9 mmol/L    Comment: Performed at [facility], [address], [place], [place] [address]  Magnesium     Status: None   Collection Time: 03/24/19  4:00 AM  Result Value Ref Range   Magnesium 1.9 1.7 - 2.4 mg/dL    Comment: Performed at [facility], [address], [place], [place] [address]   Dg Chest Port 1 View  Result Date: 03/24/2019 CLINICAL DATA:  Shortness of breath EXAM: PORTABLE CHEST 1 VIEW COMPARISON:  03/03/2019, 11/06/2018 FINDINGS: Post sternotomy changes and valve prosthesis. Cardiomegaly with vascular congestion. Interim worsening of diffuse bilateral interstitial and ground-glass opacity. No pleural effusion. No pneumothorax. IMPRESSION: Cardiomegaly with vascular congestion and interval worsening of diffuse bilateral interstitial and ground-glass opacity, suspicious for pulmonary edema.  Electronically Signed   By: Donavan Foil M.D.   On: 03/24/2019 03:58    Pending Labs Unresulted Labs (From admission, onward)    Start     Ordered   03/31/19 0500  Creatinine, serum  (enoxaparin (LOVENOX)    CrCl >/= 30 ml/min)  Weekly,   R    Comments: while on enoxaparin therapy    03/24/19 0552   03/25/19 2426  Basic metabolic panel  Daily,   R     03/24/19 0552          Vitals/Pain Today's Vitals   03/24/19 0430 03/24/19 0600 03/24/19 0628 03/24/19 0700  BP: 113/77 113/72  103/74  Pulse: (!) 106 100  100  Resp: (!) 47 (!) 37  13  Temp:      SpO2: 96% 95%  100%  Weight:      Height:      PainSc:   0-No pain     Isolation Precautions No active isolations  Medications Medications  enoxaparin (LOVENOX) injection 40 mg (has no administration in time range)  furosemide (LASIX) injection 40 mg (has no administration in time range)  acetaminophen (TYLENOL) tablet 650 mg (has no administration in time range)    Or  acetaminophen (TYLENOL) suppository 650 mg (has no administration in time range)  albuterol (PROVENTIL,VENTOLIN) solution continuous neb (10 mg/hr Nebulization Given 03/24/19 0337)  furosemide (LASIX) injection 60 mg (60 mg Intravenous Given 03/24/19 0338)  ondansetron (ZOFRAN) injection 4 mg (4 mg Intravenous Given 03/24/19 0353)  potassium chloride SA (K-DUR) CR tablet 40 mEq (40 mEq Oral Given 03/24/19 0623)  magnesium sulfate IVPB 2 g 50 mL (0 g Intravenous Stopped 03/24/19 0619)    Mobility walks  Focused Assessments    R Recommendations: See Admitting Provider Note  Report given to:   Additional Notes:

## 2019-03-24 NOTE — ED Notes (Signed)
Pt had a change in rhythm to ventricular bigeminy while changing linen, when pt was repositioned to semi fowler's position, rhythm changed to sinus tach again, ekg performed, given to Dr Tomi Bamberger,

## 2019-03-24 NOTE — ED Notes (Signed)
Pt remains on bi-pap, awakens now, states is feeling better, breathing easier, nad.  Pt informed of wait for admission bed.  Pt agreeable, no complaints

## 2019-03-24 NOTE — H&P (Signed)
History and Physical    Theresa DaneLatasha Y Hinch WGN:[id number] DOB: 1981-08-02 DOA: 03/24/2019  PCP: Grayce SessionsEdwards, Michelle P, NP  Patient coming from: Home.  I have personally briefly reviewed patient[facility]  Chief Complaint: Shortness of breath.  HPI: Theresa Crawford is a 38 y.o. female with medical history significant of anxiety, depression, arthritis of the lower back, chronic lower back pain, GERD, chronic normocytic anemia, sarcoidosis, tobacco use, headache, obesity, hypertension, mitral valve stenosis with mitral valve repair, pericarditis, PACs, PVCs, pulmonary hypertension, tricuspid regurgitation who was admitted from 03/10/2021 03/08/2019 due to pulmonary edema secondary to mitral valve restenosis, diastolic heart failure who is returning to the emergency department due to sudden onset of severe dyspnea associated with persistent occasionally productive cough of pink frothy sputum while she was reading in bed.  She denied pressure-like chest pain, but states that she has pleuritic chest pain due to all the coughing.  No dizziness, palpitations, diaphoresis or lower extremity edema.  She denies fever, chills, rhinorrhea or sore throat.  She also has been having 3-4 episodes of loose stools for the past 3 days.  Yesterday she has some nausea and an episode of emesis.  She denies constipation, melena or hematochezia.  No dysuria, frequency or hematuria.  Denies polyuria, polydipsia, polyphagia or blurred vision.  She states that she has been very anxious lately with this issue.  ED Course: Initial vital signs temperature 37.6 F, pulse 110, respirations 36, blood pressure 105/89 mmHg and O2 sat 100% on BiPAP ventilation.  White count was 17.0, hemoglobin 10.4 g/dL and platelets [id number].  Lactic acid was normal.  CMP shows a potassium of 3.1 mmol/L.  Glucose 111 and calcium 8.1 g/dL.BNP was 444.0 pg/mL.  EKG shows sinus tachycardia with left atrial enlargement.  Her chest  radiograph shows pulmonary edema.  Review of Systems: As per HPI otherwise 10 point review of systems negative.  Past Medical History:  Diagnosis Date   Anxiety    Arthritis    "lower back" (04/04/2018)   Chronic bronchitis (HCC)    Chronic diastolic CHF (congestive heart failure) (HCC)    Chronic lower back pain    DDD (degenerative disc disease), lumbar    Depression    GERD (gastroesophageal reflux disease)    Headache    "weekly" (04/04/2018)   Heart murmur    Hypertension    Mitral valve disease    Normocytic anemia    Obesity    Palpitations    Pericarditis    Premature atrial contractions    Pulmonary hypertension (HCC)    PVC's (premature ventricular contractions)    a. h/o palpitations with event monitor in 03/2017 showing NSR with PACs/PVCs.   Recurrent mitral valve stenosis and regurgitation s/p mitral valve repair    S/P mitral valve repair 03/27/2013   [place] complex valvuloplasty including resuspension of entire posterior leaflet using Gore-tex neochords and 30 mm Edwards Physio ring annuloplasty   Sarcoidosis    Tobacco abuse    Tricuspid regurgitation     Past Surgical History:  Procedure Laterality Date   ABDOMINAL HERNIA REPAIR  ~ 2011   ABDOMINAL HYSTERECTOMY  2011   "partial"; PID w/problem with fallopian tubes, had left fallopian and left ovary removed, pt still having periods   CARDIAC CATHETERIZATION  2014   CESAREAN SECTION  2004; 2009   HERNIA REPAIR     MITRAL VALVE REPAIR  03/27/2013   [place]. [place] valvuloplasty including resuspension of posterior leaflet with 30 mm CE Physio ring annuloplasty   MULTIPLE EXTRACTIONS WITH ALVEOLOPLASTY N/A 04/03/2018   Procedure: Extraction of tooth #30 with alveoloplasty and gross debridement of remaining teeth;  Surgeon: Charlynne PanderKulinski, Ronald F, DDS;  Location: [facility] OR;  Service: Oral Surgery;  Laterality: N/A;   PARTIAL  HYSTERECTOMY  2011   PID w/problem with fallopian tubes, had left fallopian and left ovary removed, pt still having periods   RIGHT/LEFT HEART CATH AND CORONARY ANGIOGRAPHY N/A 03/05/2019   Procedure: RIGHT/LEFT HEART CATH AND CORONARY ANGIOGRAPHY;  Surgeon: Kathleene HazelMcAlhany, Christopher D, MD;  Location: [facility] INVASIVE CV LAB;  Service: Cardiovascular;  Laterality: N/A;   TEE WITHOUT CARDIOVERSION N/A 05/26/2016   Procedure: TRANSESOPHAGEAL ECHOCARDIOGRAM (TEE);  Surgeon: Laqueta LindenSuresh A Koneswaran, MD;  Location: [facility] ENDO SUITE;  Service: Cardiovascular;  Laterality: N/A;   TEE WITHOUT CARDIOVERSION N/A 01/25/2018   Procedure: TRANSESOPHAGEAL ECHOCARDIOGRAM (TEE) WITH PROPOFOL;  Surgeon: Jonelle SidleMcDowell, Samuel G, MD;  Location: [facility];  Service: Cardiovascular;  Laterality: N/A;   TEE WITHOUT CARDIOVERSION N/A 03/05/2019   Procedure: TRANSESOPHAGEAL ECHOCARDIOGRAM (TEE);  Surgeon: Pricilla Riffleoss, Paula V, MD;  [facility];  Service: Cardiovascular;  Laterality: N/A;     reports that she has been smoking cigarettes. She started smoking about 19 years ago. She has a 6.00 pack-year smoking history. She has never used smokeless tobacco. She reports current alcohol use. She reports previous drug use.  Allergies  Allergen Reactions   Bee Venom Anaphylaxis   Lisinopril Shortness Of Breath, Swelling and Other (See Comments)    Throat swelling   Penicillins Shortness Of Breath, Swelling and Other (See Comments)    Has patient had a PCN reaction causing immediate rash, facial/tongue/throat swelling, SOB or lightheadedness with hypotension: Yes Has patient had a PCN reaction causing severe rash involving mucus membranes or skin necrosis: Yes Has patient had a PCN reaction that required hospitalization No Has patient had a PCN reaction occurring within the last 10 years: No If all of the above answers are "NO", then may proceed with Cephalosporin use.    Perflutren Lipid Microsphere Shortness Of Breath and Other  (See Comments)    Other reaction(s): Chest Pain/Tightness   Shellfish Allergy Anaphylaxis   Contrast Media [Iodinated Diagnostic Agents] Hives, Itching and Other (See Comments)    Itching and hives to throat, neck, face and arms.   No difficulty breathing.   [facility]. Contrast Dye    Family History  Problem Relation Age of Onset   Heart failure Father        just received LVAD   Heart disease Paternal Grandfather        stent placement   Prior to Admission medications   Medication Sig Start Date End Date Taking? Authorizing Provider  albuterol (PROVENTIL HFA;VENTOLIN HFA) 108 (90 Base) MCG/ACT inhaler Inhale 2 puffs into the lungs every 6 (six) hours as needed for wheezing or shortness of breath.    [provider]  busPIRone (BUSPAR) 7.5 MG tablet Take 1 tablet (7.5 mg total) by mouth 3 (three) times daily. 03/08/19   Almon HerculesGonfa, Taye T, MD  clonazePAM (KLONOPIN) 0.5 MG tablet Take 1 tablet (0.5 mg total) by mouth 2 (two) times daily. 03/08/19   Almon HerculesGonfa, Taye T, MD  docusate sodium (COLACE) 100 MG capsule Take 100 mg by mouth daily as needed for moderate constipation.     [provider]  EPINEPHrine [facility](EPIPEN  2-PAK) 0.3 mg/0.3 mL IJ SOAJ injection Inject 0.3 Units into the muscle once.     [provider]  ferrous sulfate 325 (65 FE) MG tablet Take 1 tablet (325 mg total) by mouth 2 (two) times daily with a meal. Patient taking differently: Take 325 mg by mouth daily with breakfast.  03/08/18   Isaac Bliss, Rayford Halsted, MD  folic acid (FOLVITE) 177 MCG tablet Take 400 mcg by mouth daily.    [provider]  furosemide (LASIX) 40 MG tablet Take 1 tablet (40 mg total) by mouth daily. 03/09/19   Mercy Riding, MD  hydrOXYzine (VISTARIL) 25 MG capsule Take 1 capsule (25 mg total) by mouth 3 (three) times daily as needed. 03/19/19   Kerin Perna, NP  metoprolol succinate (TOPROL-XL) 25 MG 24 hr tablet Take  1.5 tablets (37.5 mg total) by mouth daily. 02/28/19   Herminio Commons, MD  potassium chloride SA (K-DUR) 20 MEQ tablet Take 2 tablets (40 mEq total) by mouth daily. Take 2 tablets daily 02/28/19   Herminio Commons, MD  Probiotic Product (PROBIOTIC PO) Take 1 capsule by mouth daily.    [provider]  QUEtiapine (SEROQUEL) 50 MG tablet Take 1 tablet (50 mg total) by mouth at bedtime. 04/15/18   Derrill Center, NP  sertraline (ZOLOFT) 100 MG tablet Take 1 tablet (100 mg total) by mouth daily. 03/19/19   Kerin Perna, NP  traZODone (DESYREL) 50 MG tablet Take 0.5 tablets (25 mg total) by mouth at bedtime. Patient not taking: Reported on 03/19/2019 04/15/18   Derrill Center, NP  vitamin B-12 (CYANOCOBALAMIN) 1000 MCG tablet Take 1,000 mcg by mouth daily.    [provider]    Physical Exam: Vitals:   03/24/19 0337 03/24/19 0422 03/24/19 0425 03/24/19 0430  BP:  91/64  113/77  Pulse: (!) 104 (!) 108  (!) 106  Resp: (!) 29 (!) 32  (!) 47  Temp:   99.7 F (37.6 C)   SpO2: 100% 100%  96%  Weight:      Height:        Constitutional: Mildly anxious, but states she feels better Eyes: PERRL, lids and conjunctivae normal ENMT: BiPAP mask on.  Mucous membranes look dry.  Posterior pharynx clear of any exudate or lesions. Neck: normal, supple, no masses, no thyromegaly Respiratory: Tachypneic with RR in the high 20s and low 30s.  Bibasilar rales, no wheezing, no crackles.  No accessory muscle use.  Cardiovascular: Regular rate and rhythm, positive systolic murmur, no rubs / gallops. No lower extremities pitting edema. 2+ pedal pulses. No carotid bruits.  Abdomen: Soft, no tenderness, no masses palpated. No hepatosplenomegaly. Bowel sounds positive.  Musculoskeletal: no clubbing / cyanosis. Good ROM, no contractures. Normal muscle tone.  Skin: no rashes, lesions, ulcers on limited dermatological examination. Neurologic: CN 2-12 grossly intact. Sensation intact, DTR normal.  Strength 5/5 in all 4.  Psychiatric: Normal judgment and insight. Alert and oriented x 3.  Mildly anxious mood.   Labs on Admission: I have personally reviewed following labs and imaging studies  CBC: Recent Labs  Lab 03/24/19 0400  WBC 17.0*  NEUTROABS 14.1*  HGB 10.4*  HCT 33.9*  MCV 82.7  PLT 939   Basic Metabolic Panel: Recent Labs  Lab 03/24/19 0400  NA 138  K 3.1*  CL 106  CO2 24  GLUCOSE 111*  BUN 10  CREATININE 0.84  CALCIUM 8.1*   GFR: Estimated Creatinine Clearance: 104  mL/min (by C-G formula based on SCr of 0.84 mg/dL). Liver Function Tests: Recent Labs  Lab 03/24/19 0400  AST 15  ALT 12  ALKPHOS 64  BILITOT 0.8  PROT 7.3  ALBUMIN 3.7   No results for input(s): LIPASE, AMYLASE in the last 168 hours. No results for input(s): AMMONIA in the last 168 hours. Coagulation Profile: No results for input(s): INR, PROTIME in the last 168 hours. Cardiac Enzymes: No results for input(s): CKTOTAL, CKMB, CKMBINDEX, TROPONINI in the last 168 hours. BNP (last 3 results) No results for input(s): PROBNP in the last 8760 hours. HbA1C: No results for input(s): HGBA1C in the last 72 hours. CBG: No results for input(s): GLUCAP in the last 168 hours. Lipid Profile: No results for input(s): CHOL, HDL, LDLCALC, TRIG, CHOLHDL, LDLDIRECT in the last 72 hours. Thyroid Function Tests: No results for input(s): TSH, T4TOTAL, FREET4, T3FREE, THYROIDAB in the last 72 hours. Anemia Panel: No results for input(s): VITAMINB12, FOLATE, FERRITIN, TIBC, IRON, RETICCTPCT in the last 72 hours. Urine analysis:    Component Value Date/Time   COLORURINE YELLOW 07/09/2018 1723   APPEARANCEUR CLEAR 07/09/2018 1723   LABSPEC 1.024 07/09/2018 1723   PHURINE 6.0 07/09/2018 1723   GLUCOSEU NEGATIVE 07/09/2018 1723   HGBUR LARGE (A) 07/09/2018 1723   BILIRUBINUR small (A) 03/19/2019 1213   KETONESUR negative 03/19/2019 1213   KETONESUR NEGATIVE 07/09/2018 1723   PROTEINUR 30 (A)  07/09/2018 1723   UROBILINOGEN 1.0 03/19/2019 1213   NITRITE Negative 03/19/2019 1213   NITRITE NEGATIVE 07/09/2018 1723   LEUKOCYTESUR Negative 03/19/2019 1213    Radiological Exams on Admission: Dg Chest Port 1 View  Result Date: 03/24/2019 CLINICAL DATA:  Shortness of breath EXAM: PORTABLE CHEST 1 VIEW COMPARISON:  03/03/2019, 11/06/2018 FINDINGS: Post sternotomy changes and valve prosthesis. Cardiomegaly with vascular congestion. Interim worsening of diffuse bilateral interstitial and ground-glass opacity. No pleural effusion. No pneumothorax. IMPRESSION: Cardiomegaly with vascular congestion and interval worsening of diffuse bilateral interstitial and ground-glass opacity, suspicious for pulmonary edema. Electronically Signed   By: Jasmine PangKim  Fujinaga M.D.   On: 03/24/2019 03:58    03/04/2019 echocardiogram  IMPRESSIONS   1. The left ventricle has normal systolic function with an ejection fraction of 60-65%. The cavity size was normal. Left ventricular diastolic Doppler parameters are consistent with pseudonormalization. There is right ventricular volume and pressure  overload. No evidence of left ventricular regional wall motion abnormalities.  2. The right ventricle has normal systolic function. The cavity was moderately enlarged. There is no increase in right ventricular wall thickness. Right ventricular systolic pressure is severely elevated with an estimated pressure of 88.1 mmHg.  3. Left atrial size was moderately dilated.  4. The mitral valve is myxomatous. Moderate thickening of the mitral valve leaflet. Severe mitral valve stenosis.  5. Post mitral valve repair, annuloplasty ring. Post repair mitral stenosis (mean gradient 13mmHg - severe).  6. Tricuspid valve regurgitation is moderate.  7. The aortic valve is tricuspid. Aortic valve regurgitation was not assessed by color flow Doppler.  8. The inferior vena cava was dilated in size with <50% respiratory variability.  FINDINGS   Left Ventricle: The left ventricle has normal systolic function, with   EKG: Independently reviewed.  Vent. rate 106 BPM PR interval * ms QRS duration 81 ms QT/QTc 355/472 ms P-R-T axes 53 53 87 Sinus tachycardia Probable left atrial enlargement  Assessment/Plan Principal Problem:   Acute respiratory failure with hypoxia (HCC)   Acute on chronic diastolic congestive heart failure (  HCC)   Flash pulmonary edema Admit to [facility]/stepdown. Continue supplemental oxygen/BiPAP ventilation. Cardiac biomarkers. Serial EKG. Furosemide 40 mg IVP twice a day. Continue potassium supplementation. Monitor daily weights, intake and output. Follow-up electrolytes and renal function.  Active Problems:   Recurrent mitral valve stenosis and regurgitation    s/p mitral valve repair Consult cardiology. Consult cardiothoracic surgery. Continue treatment as above.    Pulmonary hypertension (HCC) Continue diuretics. Cardiology to evaluate.    Hypertension Hold beta-blocker. Monitor blood pressure.    Depression with anxiety Continue Seroquel. Continue BuSpar, hydroxyzine and clonazepam.    Hypokalemia Replace orally. Magnesium has been supplemented    Normocytic anemia Monitor H&H.    Tobacco abuse Nicotine replacement therapy as needed. Staff to provide tobacco cessation information.   DVT prophylaxis: Lovenox SQ. Code Status: Full code. Family Communication: Disposition Plan: Observation for acute on chronic diastolic CHF treatment. Consults called: Admission status: Observation/stepdown.   Bobette Mo MD [facility]  03/24/2019, 5:53 AM   This document was prepared using Dragon voice recognition software and may contain some unintended transcription errors.

## 2019-03-25 ENCOUNTER — Other Ambulatory Visit: Payer: Self-pay

## 2019-03-25 DIAGNOSIS — I052 Rheumatic mitral stenosis with insufficiency: Secondary | ICD-10-CM

## 2019-03-25 DIAGNOSIS — I10 Essential (primary) hypertension: Secondary | ICD-10-CM

## 2019-03-25 DIAGNOSIS — F418 Other specified anxiety disorders: Secondary | ICD-10-CM

## 2019-03-25 DIAGNOSIS — I342 Nonrheumatic mitral (valve) stenosis: Secondary | ICD-10-CM

## 2019-03-25 DIAGNOSIS — Z720 Tobacco use: Secondary | ICD-10-CM

## 2019-03-25 DIAGNOSIS — I272 Pulmonary hypertension, unspecified: Secondary | ICD-10-CM

## 2019-03-25 LAB — BASIC METABOLIC PANEL
Anion gap: 8 (ref 5–15)
BUN: 11 mg/dL (ref 6–20)
CO2: 26 mmol/L (ref 22–32)
Calcium: 8.5 mg/dL — ABNORMAL LOW (ref 8.9–10.3)
Chloride: 104 mmol/L (ref 98–111)
Creatinine, Ser: 0.91 mg/dL (ref 0.44–1.00)
GFR calc Af Amer: >60 mL/min (ref >60)
GFR calc non Af Amer: >60 mL/min (ref >60)
Glucose, Bld: 95 mg/dL (ref 70–99)
Potassium: 3.8 mmol/L (ref 3.5–5.1)
Sodium: 138 mmol/L (ref 135–145)

## 2019-03-25 LAB — CBC
HCT: 34 % — ABNORMAL LOW (ref 36.0–46.0)
Hemoglobin: 10.1 g/dL — ABNORMAL LOW (ref 12.0–15.0)
MCH: 25.1 pg — ABNORMAL LOW (ref 26.0–34.0)
MCHC: 29.7 g/dL — ABNORMAL LOW (ref 30.0–36.0)
MCV: 84.4 fL (ref 80.0–100.0)
Platelets: 291 10*3/uL (ref 150–400)
RBC: 4.03 MIL/uL (ref 3.87–5.11)
RDW: 21.3 % — ABNORMAL HIGH (ref 11.5–15.5)
WBC: 8.1 10*3/uL (ref 4.0–10.5)
nRBC: 0 % (ref 0.0–0.2)

## 2019-03-25 NOTE — Progress Notes (Addendum)
PROGRESS NOTE    Theresa Crawford  ZOX:[id number]  DOB: June 28, 1981  DOA: 03/24/2019 PCP: Grayce SessionsEdwards, Michelle P, NP  Brief Narrative:  38 year old female with history of hypertension, anxiety/depression, chronic back pain, GERD, sarcoidosis and severe valvular heart disease (mitral valve stenosis status post repair, tricuspid regurgitation) with associated pulmonary hypertension who was recently admitted to this [facility] in June for diastolic CHF secondary to mitral valve restenosis and discharged on diuretics with instructions to follow-up CT surgery as outpatient, now presents back with shortness of breath and noted to be in flash pulmonary edema/acute respiratory failure requiring BiPAP/high flow O2.  Patient transferred from [facility] on 7/11 on 10 L high flow O2.  IV Lasix 40 mg twice daily upon admission and cardiology consulted.   Subjective: Patient resting comfortably and diuresing well.  She is down to 5 L nasal cannula today.  Seen by cardiology.  Blood pressure soft with systolic 90s to 100s.  Objective: Vitals:   03/25/19 0742 03/25/19 1048 03/25/19 1200 03/25/19 1547  BP: (!) 98/56 (!) 97/51 (!) 92/49 100/60  Pulse: 96 (!) 103 96 97  Resp: (!) 21 (!) 30 18 18   Temp: 98.2 F (36.8 C) 98.3 F (36.8 C) 98 F (36.7 C) 98 F (36.7 C)  TempSrc: Oral Oral Oral Oral  SpO2: 96% 97% 98% 98%  Weight:      Height:        Intake/Output Summary (Last 24 hours) at 03/25/2019 1651 Last data filed at 03/25/2019 1300 Gross per 24 hour  Intake 760 ml  Output 800 ml  Net -40 ml   Filed Weights   03/24/19 0327 03/25/19 0300  Weight: 101.2 kg 103.4 kg    Physical Examination:  General exam: Appears calm and comfortable  Respiratory system: Clear to auscultation. Respiratory effort normal. Cardiovascular system: S1 & S2 heard, RRR. No JVD, murmurs, rubs, gallops or clicks.  1+ pitting leg/pedal edema. Gastrointestinal system: Abdomen is nondistended, soft and  nontender. No organomegaly or masses felt. Normal bowel sounds heard. Central nervous system: Alert and oriented. No focal neurological deficits. Extremities: Symmetric 5 x 5 power. Skin: No rashes, lesions or ulcers Psychiatry: Judgement and insight appear normal. Mood & affect appropriate.     Data Reviewed: I have personally reviewed following labs and imaging studies  CBC: Recent Labs  Lab 03/24/19 0400 03/25/19 0206  WBC 17.0* 8.1  NEUTROABS 14.1*  --   HGB 10.4* 10.1*  HCT 33.9* 34.0*  MCV 82.7 84.4  PLT 316 291   Basic Metabolic Panel: Recent Labs  Lab 03/24/19 0400 03/25/19 0206  NA 138 138  K 3.1* 3.8  CL 106 104  CO2 24 26  GLUCOSE 111* 95  BUN 10 11  CREATININE 0.84 0.91  CALCIUM 8.1* 8.5*  MG 1.9  --    GFR: Estimated Creatinine Clearance: 97.2 mL/min (by C-G formula based on SCr of 0.91 mg/dL). Liver Function Tests: Recent Labs  Lab 03/24/19 0400  AST 15  ALT 12  ALKPHOS 64  BILITOT 0.8  PROT 7.3  ALBUMIN 3.7   No results for input(s): LIPASE, AMYLASE in the last 168 hours. No results for input(s): AMMONIA in the last 168 hours. Coagulation Profile: No results for input(s): INR, PROTIME in the last 168 hours. Cardiac Enzymes: No results for input(s): CKTOTAL, CKMB, CKMBINDEX, TROPONINI in the last 168 hours. BNP (last 3 results) No results for input(s): PROBNP in the last 8760 hours. HbA1C: No results for input(s): HGBA1C in  the last 72 hours. CBG: No results for input(s): GLUCAP in the last 168 hours. Lipid Profile: No results for input(s): CHOL, HDL, LDLCALC, TRIG, CHOLHDL, LDLDIRECT in the last 72 hours. Thyroid Function Tests: No results for input(s): TSH, T4TOTAL, FREET4, T3FREE, THYROIDAB in the last 72 hours. Anemia Panel: No results for input(s): VITAMINB12, FOLATE, FERRITIN, TIBC, IRON, RETICCTPCT in the last 72 hours. Sepsis Labs: Recent Labs  Lab 03/24/19 0400  LATICACIDVEN 1.4    Recent Results (from the past 240  hour(s))  SARS Coronavirus 2 (CEPHEID - Performed in [place] hospital lab), Hosp Order     Status: None   Collection Time: 03/24/19  3:30 AM   Specimen: Nasopharyngeal Swab  Result Value Ref Range Status   SARS Coronavirus 2 NEGATIVE NEGATIVE Final    Comment: (NOTE) If result is NEGATIVE SARS-CoV-2 target nucleic acids are NOT DETECTED. The SARS-CoV-2 RNA is generally detectable in upper and lower  respiratory specimens during the acute phase of infection. The lowest  concentration of SARS-CoV-2 viral copies this assay can detect is 250  copies / mL. A negative result does not preclude SARS-CoV-2 infection  and should not be used as the sole basis for treatment or other  patient management decisions.  A negative result may occur with  improper specimen collection / handling, submission of specimen other  than nasopharyngeal swab, presence of viral mutation(s) within the  areas targeted by this assay, and inadequate number of viral copies  (<250 copies / mL). A negative result must be combined with clinical  observations, patient history, and epidemiological information. If result is POSITIVE SARS-CoV-2 target nucleic acids are DETECTED. The SARS-CoV-2 RNA is generally detectable in upper and lower  respiratory specimens dur ing the acute phase of infection.  Positive  results are indicative of active infection with SARS-CoV-2.  Clinical  correlation with patient history and other diagnostic information is  necessary to determine patient infection status.  Positive results do  not rule out bacterial infection or co-infection with other viruses. If result is PRESUMPTIVE POSTIVE SARS-CoV-2 nucleic acids MAY BE PRESENT.   A presumptive positive result was obtained on the submitted specimen  and confirmed on repeat testing.  While 2019 novel coronavirus  (SARS-CoV-2) nucleic acids may be present in the submitted sample  additional confirmatory testing may be necessary for  epidemiological  and / or clinical management purposes  to differentiate between  SARS-CoV-2 and other Sarbecovirus currently known to infect humans.  If clinically indicated additional testing with an alternate test  methodology [phone]) is advised. The SARS-CoV-2 RNA is generally  detectable in upper and lower respiratory sp ecimens during the acute  phase of infection. The expected result is Negative. Fact Sheet for Patients:  StrictlyIdeas.no Fact Sheet for Healthcare Providers: BankingDealers.co.za This test is not yet approved or cleared by the [place] FDA and has been authorized for detection and/or diagnosis of SARS-CoV-2 by FDA under an Emergency Use Authorization (EUA).  This EUA will remain in effect (meaning this test can be used) for the duration of the COVID-19 declaration under Section 564(b)(1) of the Act, 21 U.S.C. section 360bbb-3(b)(1), unless the authorization is terminated or revoked sooner. Performed at [facility], [address] [place], [place] [address]   MRSA PCR Screening     Status: None   Collection Time: 03/24/19 10:57 AM   Specimen: Nasopharyngeal  Result Value Ref Range Status   MRSA by PCR NEGATIVE NEGATIVE Final    Comment:  The GeneXpert MRSA Assay (FDA approved for NASAL specimens only), is one component of a comprehensive MRSA colonization surveillance program. It is not intended to diagnose MRSA infection nor to guide or monitor treatment for MRSA infections. Performed at [facility], [address] [place], [place] [address]       Radiology Studies: Dg Chest Port 1 View  Result Date: 03/24/2019 CLINICAL DATA:  Shortness of breath EXAM: PORTABLE CHEST 1 VIEW COMPARISON:  03/03/2019, 11/06/2018 FINDINGS: Post sternotomy changes and valve prosthesis. Cardiomegaly with vascular congestion. Interim worsening of diffuse bilateral interstitial and ground-glass opacity. No  pleural effusion. No pneumothorax. IMPRESSION: Cardiomegaly with vascular congestion and interval worsening of diffuse bilateral interstitial and ground-glass opacity, suspicious for pulmonary edema. Electronically Signed   By: Jasmine PangKim  Fujinaga M.D.   On: 03/24/2019 03:58        Scheduled Meds:  busPIRone  7.5 mg Oral TID   clonazePAM  0.5 mg Oral BID   enoxaparin (LOVENOX) injection  40 mg Subcutaneous Q24H   ferrous sulfate  325 mg Oral Q breakfast   folic acid  500 mcg Oral Daily   furosemide  40 mg Intravenous BID   potassium chloride SA  40 mEq Oral Daily   QUEtiapine  50 mg Oral QHS   sertraline  100 mg Oral Daily   traZODone  25 mg Oral QHS   Continuous Infusions:  Assessment & Plan:    1.  Acute diastolic CHF exacerbation in the setting of severe valvular heart disease: Continue IV diuretics as tolerated by blood pressure/renal function.  Monitor I's and O's/daily weights (-1200 documented yesterday). Patient seen by cardiology and anticipating CT surgery evaluation with Dr. Barry Dieneswens prior to discharge.  2. Acute hypoxic respiratory failure: sec to #1. Titrate 02 to off as tolerated   3.  Pulmonary HTN: sec to #1 and #4. Not on home 02. Continue current management.   4. Sarcoidosis: No acute issues.  Follow-up as outpatient.  5.  Anxiety/depression: On multiple psych meds including buspirone, clonazepam, quetiapine, sertraline and trazodone. Been somnolent but easily arousable while here.   6.  Chronic anemia: On iron/folate supplements.  7. GERD: Add PPI or pepcid  8. Leukocytosis: Likely reactive.  Now resolved  DVT prophylaxis: lovenox Code Status: full code Family / Patient Communication: d/w patient Disposition Plan: home when medically cleared     LOS: 1 day    Time spent:     Alessandra BevelsNeelima Makiyah Zentz, MD [facility] Pager [phone]  If 7PM-7AM, please contact night-coverage [email or web address] Password [email or web address] 03/25/2019, 4:51 PM

## 2019-03-25 NOTE — Progress Notes (Signed)
Progress Note  Patient Name: Theresa Crawford Date of Encounter: 03/25/2019  Primary Cardiologist: Prentice DockerSuresh Koneswaran, MD   Subjective   Feeling clinically improved this morning after diuresis  Inpatient Medications    Scheduled Meds: . busPIRone  7.5 mg Oral TID  . clonazePAM  0.5 mg Oral BID  . enoxaparin (LOVENOX) injection  40 mg Subcutaneous Q24H  . ferrous sulfate  325 mg Oral Q breakfast  . folic acid  500 mcg Oral Daily  . furosemide  40 mg Intravenous BID  . potassium chloride SA  40 mEq Oral Daily  . QUEtiapine  50 mg Oral QHS  . sertraline  100 mg Oral Daily  . traZODone  25 mg Oral QHS   Continuous Infusions:  PRN Meds: acetaminophen **OR** acetaminophen, albuterol, hydrOXYzine   Vital Signs    Vitals:   03/24/19 1910 03/24/19 2300 03/25/19 0300 03/25/19 0742  BP:  107/61 (!) 105/23 (!) 98/56  Pulse: 92 91 89 96  Resp:  12 12 (!) 21  Temp:  98.1 F (36.7 C) 98.4 F (36.9 C) 98.2 F (36.8 C)  TempSrc:  Oral Oral Oral  SpO2:  94% 93% 96%  Weight:   103.4 kg   Height:        Intake/Output Summary (Last 24 hours) at 03/25/2019 0926 Last data filed at 03/24/2019 2300 Gross per 24 hour  Intake 720 ml  Output 1000 ml  Net -280 ml   Last 3 Weights 03/25/2019 03/24/2019 03/19/2019  Weight (lbs) 227 lb 15.3 oz 223 lb 219 lb  Weight (kg) 103.4 kg 101.152 kg 99.338 kg  Some encounter information is confidential and restricted. Go to Review Flowsheets activity to see all data.      Telemetry    Sinus rhythm/sinus tachycardia- Personally Reviewed  ECG    Not performed today- Personally Reviewed  Physical Exam   GEN: No acute distress.   Neck: No JVD Cardiac: RRR, no murmurs, rubs, or gallops.  Respiratory: Clear to auscultation bilaterally. GI: Soft, nontender, non-distended  MS: No edema; No deformity. Neuro:  Nonfocal  Psych: Normal affect   Labs    High Sensitivity Troponin:  No results for input(s): TROPONINIHS in the last 720 hours.     Cardiac EnzymesNo results for input(s): TROPONINI in the last 168 hours. No results for input(s): TROPIPOC in the last 168 hours.   Chemistry Recent Labs  Lab 03/24/19 0400 03/25/19 0206  NA 138 138  K 3.1* 3.8  CL 106 104  CO2 24 26  GLUCOSE 111* 95  BUN 10 11  CREATININE 0.84 0.91  CALCIUM 8.1* 8.5*  PROT 7.3  --   ALBUMIN 3.7  --   AST 15  --   ALT 12  --   ALKPHOS 64  --   BILITOT 0.8  --   GFRNONAA >60 >60  GFRAA >60 >60  ANIONGAP 8 8     Hematology Recent Labs  Lab 03/24/19 0400 03/25/19 0206  WBC 17.0* 8.1  RBC 4.10 4.03  HGB 10.4* 10.1*  HCT 33.9* 34.0*  MCV 82.7 84.4  MCH 25.4* 25.1*  MCHC 30.7 29.7*  RDW 21.4* 21.3*  PLT 316 291    BNP Recent Labs  Lab 03/24/19 0326  BNP 444.0*     DDimer No results for input(s): DDIMER in the last 168 hours.   Radiology    Dg Chest Port 1 View  Result Date: 03/24/2019 CLINICAL DATA:  Shortness of breath EXAM: PORTABLE CHEST 1  VIEW COMPARISON:  03/03/2019, 11/06/2018 FINDINGS: Post sternotomy changes and valve prosthesis. Cardiomegaly with vascular congestion. Interim worsening of diffuse bilateral interstitial and ground-glass opacity. No pleural effusion. No pneumothorax. IMPRESSION: Cardiomegaly with vascular congestion and interval worsening of diffuse bilateral interstitial and ground-glass opacity, suspicious for pulmonary edema. Electronically Signed   By: Donavan Foil M.D.   On: 03/24/2019 03:58    Cardiac Studies   Transesophageal echo (03/05/2019)  IMPRESSIONS    1. The left ventricle has mildly reduced systolic function, with an ejection fraction of 45-50%. The cavity size was normal.  2. The right ventricle has mildly reduced systolic function. The cavity was mildly enlarged.  3. LA, LAA without masses.  4. S/P Mitral valve repair. 2D and 3D imaging done. The mitral leaflet is thickened with restricted motion, particularly the posterior leaflet. Peak and mean gradients through the valve are  13 and 8 mm Hg respectively. MVA by Pressure T1/2 is 1.85 cm2  consistent with mild MS 2D imaging suggests more moderate.  5. Tricuspid valve regurgitation is mild-moderate.  6. The aortic valve is tricuspid Aortic valve regurgitation is trivial by color flow Doppler.  Cardiac catheterization (03/05/2019)  Conclusion    Prox RCA to Mid RCA lesion is 10% stenosed.  Prox Cx to Mid Cx lesion is 20% stenosed.  Mid LAD lesion is 10% stenosed.   1. Mild non-obstructive CAD     Patient Profile     38 y.o. female with a history of mitral valve repair in [place] in 2014.  She was admitted with acute heart failure last night.  She does have mild to moderate mitral stenosis and severe pulmonary hypertension.  She was diuresed 1.5 L last night and feels clinically improved  Assessment & Plan    1: Valvular heart disease- flash pulmonary edema last night 6 years status post surgical intervention on her mitral valve in [facility] 2014.  Recent TEE suggested moderate mitral stenosis.  She feels clinically improved after diuresis.  We will get thoracic surgery to evaluate       For questions or updates, please contact [place] Please consult [email or web address] for contact info under        Signed, Quay Burow, MD  03/25/2019, 9:26 AM

## 2019-03-25 NOTE — Plan of Care (Signed)

## 2019-03-26 ENCOUNTER — Ambulatory Visit: Payer: Medicaid Other | Admitting: Thoracic Surgery (Cardiothoracic Vascular Surgery)

## 2019-03-26 DIAGNOSIS — I340 Nonrheumatic mitral (valve) insufficiency: Secondary | ICD-10-CM

## 2019-03-26 DIAGNOSIS — J9601 Acute respiratory failure with hypoxia: Secondary | ICD-10-CM

## 2019-03-26 LAB — BASIC METABOLIC PANEL
Anion gap: 7 (ref 5–15)
BUN: 12 mg/dL (ref 6–20)
CO2: 27 mmol/L (ref 22–32)
Calcium: 8.8 mg/dL — ABNORMAL LOW (ref 8.9–10.3)
Chloride: 103 mmol/L (ref 98–111)
Creatinine, Ser: 0.9 mg/dL (ref 0.44–1.00)
GFR calc Af Amer: >60 mL/min (ref >60)
GFR calc non Af Amer: >60 mL/min (ref >60)
Glucose, Bld: 131 mg/dL — ABNORMAL HIGH (ref 70–99)
Potassium: 3.8 mmol/L (ref 3.5–5.1)
Sodium: 137 mmol/L (ref 135–145)

## 2019-03-26 MED ORDER — FUROSEMIDE 10 MG/ML IJ SOLN
60.0000 mg | Freq: Two times a day (BID) | INTRAMUSCULAR | Status: DC
  Administered 2019-03-26 – 2019-03-27 (×2): 60 mg via INTRAVENOUS
  Filled 2019-03-26 (×2): qty 6

## 2019-03-26 MED ORDER — WARFARIN - PHYSICIAN DOSING INPATIENT: Freq: Every day | Status: DC

## 2019-03-26 MED ORDER — BUDESONIDE 0.5 MG/2ML IN SUSP
0.5000 mg | Freq: Two times a day (BID) | RESPIRATORY_TRACT | Status: DC
  Administered 2019-03-26 – 2019-03-29 (×6): 0.5 mg via RESPIRATORY_TRACT
  Filled 2019-03-26 (×7): qty 2

## 2019-03-26 MED ORDER — WARFARIN SODIUM 2 MG PO TABS
2.0000 mg | ORAL_TABLET | Freq: Every day | ORAL | Status: AC
  Administered 2019-03-26: 2 mg via ORAL
  Filled 2019-03-26: qty 1

## 2019-03-26 MED ORDER — SODIUM CHLORIDE 0.9 % IV SOLN: 250.0000 mL | INTRAVENOUS | Status: DC | PRN

## 2019-03-26 MED ORDER — FUROSEMIDE 10 MG/ML IJ SOLN: 20.0000 mg | Freq: Two times a day (BID) | INTRAMUSCULAR | Status: DC

## 2019-03-26 MED ORDER — SODIUM CHLORIDE 0.9% FLUSH
3.0000 mL | Freq: Two times a day (BID) | INTRAVENOUS | Status: DC
  Administered 2019-03-26 – 2019-03-29 (×6): 3 mL via INTRAVENOUS

## 2019-03-26 MED ORDER — PATIENT'S GUIDE TO USING COUMADIN BOOK
Freq: Once | Status: DC
  Filled 2019-03-26: qty 1

## 2019-03-26 MED ORDER — COUMADIN BOOK
Freq: Once | Status: AC
  Administered 2019-03-26: 17:00:00
  Filled 2019-03-26: qty 1

## 2019-03-26 MED ORDER — ASPIRIN 81 MG PO CHEW
81.0000 mg | CHEWABLE_TABLET | ORAL | Status: AC
  Administered 2019-03-27: 06:00:00 81 mg via ORAL
  Filled 2019-03-26: qty 1

## 2019-03-26 MED ORDER — SODIUM CHLORIDE 0.9 % IV SOLN: INTRAVENOUS | Status: DC

## 2019-03-26 MED ORDER — SODIUM CHLORIDE 0.9% FLUSH: 3.0000 mL | INTRAVENOUS | Status: DC | PRN

## 2019-03-26 MED ORDER — METOPROLOL SUCCINATE ER 25 MG PO TB24
25.0000 mg | ORAL_TABLET | Freq: Every day | ORAL | Status: DC
  Administered 2019-03-26: 25 mg via ORAL
  Filled 2019-03-26: qty 1

## 2019-03-26 MED ORDER — METOPROLOL SUCCINATE ER 25 MG PO TB24
25.0000 mg | ORAL_TABLET | Freq: Two times a day (BID) | ORAL | Status: DC
  Administered 2019-03-26 – 2019-03-29 (×6): 25 mg via ORAL
  Filled 2019-03-26 (×6): qty 1

## 2019-03-26 MED ORDER — FLUTICASONE PROPIONATE 50 MCG/ACT NA SUSP
1.0000 | NASAL | Status: DC | PRN
  Administered 2019-03-26: 09:00:00 1 via NASAL
  Filled 2019-03-26: qty 16

## 2019-03-26 NOTE — TOC Initial Note (Signed)
Transition of Care [facility]) - Initial/Assessment Note    Patient Details  Name: Theresa Crawford MRN: [id number] Date of Birth: [birth year]  [facility] CM/SW Contact:    Gelene Mink, [place] Phone Number: 03/26/2019, 11:33 AM  Clinical Narrative:                  CSW met with the patient at bedside. CSW completed [place] HF screen. The patient stated that she has a scale at home for daily weight checks. The patient stated that she adheres to a low sodium diet. CSW asked if the patient was independent at home, she stated yes. CSW asked if she felt like she needed any equipment to assist her with mobility she declined. CSW asked the patient about a CHF nurse, she stated maybe.   CSW called the patient back for a definite answer on the home health nurse for CHF education. She stated yes. CSW offered choice, she stated that she did not have a preference. CSW spoke with the RN, the RN stated that the patient needs all of the help that she can get.   Patient will need home health orders for an CHF home health nurse for disease management. CSW will continue to follow.   Expected Discharge Plan: [place] Barriers to Discharge: Continued Medical Work up   Patient Goals and CMS Choice Patient states their goals for this hospitalization and ongoing recovery are:: Pt will return home with CHF nurse for disease management CMS Medicare.gov Compare Post Acute Care list provided to:: Patient Choice offered to / list presented to : Patient  Expected Discharge Plan and Services Expected Discharge Plan: [place] In-house Referral: Clinical Social Work Discharge Planning Services: NA Post Acute Care Choice: [place] arrangements for the past 2 months: Single Family Home                 DME Arranged: N/A DME Agency: NA       HH Arranged: NA [place] Agency: NA        Prior Living Arrangements/Services Living arrangements for the past 2 months:  [place] Lives with:: Self Patient language and need for interpreter reviewed:: No Do you feel safe going back to the place where you live?: Yes      Need for Family Participation in Patient Care: No (Comment) Care giver support system in place?: No (comment)   Criminal Activity/Legal Involvement Pertinent to Current Situation/Hospitalization: No - Comment as needed  Activities of Daily Living Home Assistive Devices/Equipment: None ADL Screening (condition at time of admission) Patient's cognitive ability adequate to safely complete daily activities?: Yes Is the patient deaf or have difficulty hearing?: No Does the patient have difficulty seeing, even when wearing glasses/contacts?: No Does the patient have difficulty concentrating, remembering, or making decisions?: No Patient able to express need for assistance with ADLs?: Yes Does the patient have difficulty dressing or bathing?: No Independently performs ADLs?: Yes (appropriate for developmental age) Does the patient have difficulty walking or climbing stairs?: No Weakness of Legs: None Weakness of Arms/Hands: None  Permission Sought/Granted Permission sought to share information with : Case Manager Permission granted to share information with : Yes, Verbal Permission Granted              Emotional Assessment Appearance:: Appears stated age Attitude/Demeanor/Rapport: Engaged Affect (typically observed): Calm Orientation: : Oriented to Self, Oriented to Place, Oriented to  Time, Oriented to Situation Alcohol /  Substance Use: Not Applicable Psych Involvement: No (comment)  Admission diagnosis:  Sarcoidosis [D86.9] Bronchospasm [J98.01] Hypoxia [R09.02] Acute on chronic congestive heart failure, unspecified heart failure type ([place]) [I50.9] Flash pulmonary edema (HCC) [J81.0] Patient Active Problem List   Diagnosis Date Noted  . Acute on chronic diastolic congestive heart failure ([place]) 03/24/2019  . Normocytic  anemia   . Dysuria 03/19/2019  . Acute CHF (congestive heart failure) ([place]) 03/04/2019  . MDD (major depressive disorder), recurrent episode ([place]) 04/05/2018  . S/P tooth extraction 04/03/2018  . Suicidal ideation 04/03/2018  . MDD (major depressive disorder)   . Chronic diastolic congestive heart failure ([place])   . Tricuspid regurgitation   . Hypomagnesemia 03/07/2018  . Tobacco abuse   . Dyspnea 01/11/2018  . Prolonged QT interval 09/10/2017  . Normochromic normocytic anemia   . Pulmonary edema 07/05/2017  . Hypokalemia 06/12/2017  . Pulmonary hypertension ([place]) 06/12/2017  . Flash pulmonary edema ([place]) 06/11/2017  . Depression with anxiety 06/11/2017  . Recurrent mitral valve stenosis and regurgitation s/p mitral valve repair   . Acute pulmonary edema ([place]) 10/10/2016  . CAP (community acquired pneumonia) 05/25/2016  . Leukocytosis 05/24/2016  . Acute respiratory failure with hypoxia ([place]) 05/23/2016  . Acute on chronic diastolic CHF (congestive heart failure) ([place]) 05/23/2016  . Cough with hemoptysis 05/23/2016  . Postpartum complication pericarditis in 2009 with eventual needing MVR in 2015 05/23/2016  . Anemia, iron deficiency 05/23/2016  . Hypertension   . SOB (shortness of breath)   . Respiratory distress 02/16/2016  . Essential hypertension 02/16/2016  . S/P mitral valve repair 03/27/2013   PCP:  Kerin Perna, NP Pharmacy:   [facility] [place] [facility] [place] [address] Phone: [phone] Fax: [phone]  [facility] [place] [place] - [address] [place] [address] Phone: [phone] Fax: [phone]     Social Determinants of Health (SDOH) Interventions    Readmission Risk Interventions No flowsheet data found.

## 2019-03-26 NOTE — Progress Notes (Signed)
Progress Note  Patient Name: Theresa Crawford Date of Encounter: 03/26/2019  Primary Cardiologist: Prentice DockerSuresh Koneswaran, MD   Subjective   Feels better today after diuresis.   Inpatient Medications    Scheduled Meds: . busPIRone  7.5 mg Oral TID  . clonazePAM  0.5 mg Oral BID  . enoxaparin (LOVENOX) injection  40 mg Subcutaneous Q24H  . ferrous sulfate  325 mg Oral Q breakfast  . folic acid  500 mcg Oral Daily  . furosemide  40 mg Intravenous BID  . potassium chloride SA  40 mEq Oral Daily  . QUEtiapine  50 mg Oral QHS  . sertraline  100 mg Oral Daily  . traZODone  25 mg Oral QHS   Continuous Infusions:  PRN Meds: acetaminophen **OR** acetaminophen, albuterol, fluticasone, hydrOXYzine   Vital Signs    Vitals:   03/25/19 2300 03/26/19 0300 03/26/19 0500 03/26/19 0600  BP: (!) 110/53 104/72    Pulse: 99 98  96  Resp: (!) 28 (!) 22  (!) 24  Temp: 99.5 F (37.5 C) 98.1 F (36.7 C)    TempSrc: Axillary Oral    SpO2: 96% 97%  98%  Weight:   104.1 kg   Height:        Intake/Output Summary (Last 24 hours) at 03/26/2019 0835 Last data filed at 03/26/2019 0500 Gross per 24 hour  Intake 1360 ml  Output 2000 ml  Net -640 ml   Last 3 Weights 03/26/2019 03/25/2019 03/24/2019  Weight (lbs) 229 lb 8 oz 227 lb 15.3 oz 223 lb  Weight (kg) 104.1 kg 103.4 kg 101.152 kg  Some encounter information is confidential and restricted. Go to Review Flowsheets activity to see all data.      Telemetry    SR/ST 99-low 100s- Personally Reviewed  ECG    No new- Personally Reviewed  Physical Exam   GEN: No acute distress.   Neck: No JVD Cardiac: RRR, soft systolic and diastolic murmur, rubs, or gallops.  Respiratory: Clear to auscultation bilaterally. GI: Soft, nontender, non-distended  MS: No edema; No deformity. Neuro:  Nonfocal  Psych: Normal affect   Labs    High Sensitivity Troponin:  No results for input(s): TROPONINIHS in the last 720 hours.    Cardiac EnzymesNo  results for input(s): TROPONINI in the last 168 hours. No results for input(s): TROPIPOC in the last 168 hours.   Chemistry Recent Labs  Lab 03/24/19 0400 03/25/19 0206 03/26/19 0253  NA 138 138 137  K 3.1* 3.8 3.8  CL 106 104 103  CO2 24 26 27   GLUCOSE 111* 95 131*  BUN 10 11 12   CREATININE 0.84 0.91 0.90  CALCIUM 8.1* 8.5* 8.8*  PROT 7.3  --   --   ALBUMIN 3.7  --   --   AST 15  --   --   ALT 12  --   --   ALKPHOS 64  --   --   BILITOT 0.8  --   --   GFRNONAA >60 >60 >60  GFRAA >60 >60 >60  ANIONGAP 8 8 7      Hematology Recent Labs  Lab 03/24/19 0400 03/25/19 0206  WBC 17.0* 8.1  RBC 4.10 4.03  HGB 10.4* 10.1*  HCT 33.9* 34.0*  MCV 82.7 84.4  MCH 25.4* 25.1*  MCHC 30.7 29.7*  RDW 21.4* 21.3*  PLT 316 291    BNP Recent Labs  Lab 03/24/19 0326  BNP 444.0*     DDimer No results  for input(s): DDIMER in the last 168 hours.   Radiology    No results found.  Cardiac Studies  [facility] 03/05/2019  Prox RCA to Mid RCA lesion is 10% stenosed.  Prox Cx to Mid Cx lesion is 20% stenosed.  Mid LAD lesion is 10% stenosed.   1. Mild non-obstructive CAD  [facility] 03/05/2019  1. The left ventricle has mildly reduced systolic function, with an ejection fraction of 45-50%. The cavity size was normal.  2. The right ventricle has mildly reduced systolic function. The cavity was mildly enlarged.  3. LA, LAA without masses.  4. S/P Mitral valve repair. 2D and 3D imaging done. The mitral leaflet is thickened with restricted motion, particularly the posterior leaflet. Peak and mean gradients through the valve are 13 and 8 mm Hg respectively. MVA by Pressure T1/2 is 1.85 cm2  consistent with mild MS 2D imaging suggests more moderate.  5. Tricuspid valve regurgitation is mild-moderate.  6. The aortic valve is tricuspid Aortic valve regurgitation is trivial by color flow Doppler.  Patient Profile     38 y.o. female  Theresa Crawford is a 38 y.o. female with a hx of mitral  valve repair with mitral stenosis  who is being seen today for the evaluation of  Acute CHF.   Assessment & Plan    Principal Problem:   Acute on chronic diastolic congestive heart failure (HCC) Active Problems:   Acute respiratory failure with hypoxia (HCC)   Hypertension   Flash pulmonary edema (HCC)   Depression with anxiety   Recurrent mitral valve stenosis and regurgitation s/p mitral valve repair   Hypokalemia   Pulmonary hypertension (HCC)   Tobacco abuse   Normocytic anemia  S/p MVR now with mitral stenosis - Can continue lasix 40 mg IV BID today, renal function stable. She was on lasix 40 mg daily po prior to admission, can likely transition to this tomorrow. Will need close cardiology follow up.  Her beta blocker was held on admission however this will be a key part of her medical management mitral stenosis to allow for diastolic filling with her elevated heart rate, and may in fact improve the blood pressure.  Certainly we want to avoid hypotension with her elevated pulmonary pressure, however, improving hemodynamics with MS may help in that regard as well.   I will reinitiate metoprolol succinate as the nurse plans to ambulate her today, which will raise the HR and likely complicated the mitral stenosis. I have independently review the TEE images and mitral regurgitation appears mild.       For questions or updates, please contact [place] Please consult [email or web address] for contact info under        Signed, Elouise Munroe, MD  03/26/2019, 8:35 AM

## 2019-03-26 NOTE — Progress Notes (Signed)
PROGRESS NOTE    Theresa Crawford  YQM:[id number]  DOB: Jul 23, 1981  DOA: 03/24/2019 PCP: Kerin Perna, NP  Brief Narrative:  38 year old female with history of hypertension, anxiety/depression, chronic back pain, GERD, sarcoidosis and severe valvular heart disease (mitral valve stenosis status post repair, tricuspid regurgitation) with associated pulmonary hypertension who was recently admitted to this [facility] in June for diastolic CHF secondary to mitral valve restenosis and discharged on diuretics with instructions to follow-up CT surgery as outpatient, now presents back with shortness of breath and noted to be in flash pulmonary edema/acute respiratory failure requiring BiPAP/high flow O2.  Patient transferred from [facility] on 7/11 on 10 L high flow O2.  IV Lasix 40 mg twice daily upon admission and cardiology consulted.   Subjective: Patient resting comfortably and diuresing well.  She is down to 4 L nasal cannula today.  Seen by cardiology and CT surgery.  Blood pressure soft with systolic 52W to 413K.  Objective: Vitals:   03/25/19 2300 03/26/19 0300 03/26/19 0500 03/26/19 0600  BP: (!) 110/53 104/72    Pulse: 99 98  96  Resp: (!) 28 (!) 22  (!) 24  Temp: 99.5 F (37.5 C) 98.1 F (36.7 C)    TempSrc: Axillary Oral    SpO2: 96% 97%  98%  Weight:   104.1 kg   Height:        Intake/Output Summary (Last 24 hours) at 03/26/2019 0956 Last data filed at 03/26/2019 0900 Gross per 24 hour  Intake 1480 ml  Output 1600 ml  Net -120 ml   Filed Weights   03/24/19 0327 03/25/19 0300 03/26/19 0500  Weight: 101.2 kg 103.4 kg 104.1 kg    Physical Examination:  General exam: Appears calm and comfortable  Respiratory system: Clear to auscultation. Respiratory effort normal. Cardiovascular system: S1 & S2 heard, +diastolic murmur,no rubs, gallops or clicks.  1+ pitting leg/pedal edema. Gastrointestinal system: Abdomen is nondistended, soft and nontender. No  organomegaly or masses felt. Normal bowel sounds heard. Central nervous system: Alert and oriented. No focal neurological deficits. Extremities: Symmetric 5 x 5 power. Skin: No rashes, lesions or ulcers Psychiatry: Judgement and insight appear normal. Mood & affect appropriate.     Data Reviewed: I have personally reviewed following labs and imaging studies  CBC: Recent Labs  Lab 03/24/19 0400 03/25/19 0206  WBC 17.0* 8.1  NEUTROABS 14.1*  --   HGB 10.4* 10.1*  HCT 33.9* 34.0*  MCV 82.7 84.4  PLT 316 440   Basic Metabolic Panel: Recent Labs  Lab 03/24/19 0400 03/25/19 0206 03/26/19 0253  NA 138 138 137  K 3.1* 3.8 3.8  CL 106 104 103  CO2 24 26 27   GLUCOSE 111* 95 131*  BUN 10 11 12   CREATININE 0.84 0.91 0.90  CALCIUM 8.1* 8.5* 8.8*  MG 1.9  --   --    GFR: Estimated Creatinine Clearance: 98.7 mL/min (by C-G formula based on SCr of 0.9 mg/dL). Liver Function Tests: Recent Labs  Lab 03/24/19 0400  AST 15  ALT 12  ALKPHOS 64  BILITOT 0.8  PROT 7.3  ALBUMIN 3.7   No results for input(s): LIPASE, AMYLASE in the last 168 hours. No results for input(s): AMMONIA in the last 168 hours. Coagulation Profile: No results for input(s): INR, PROTIME in the last 168 hours. Cardiac Enzymes: No results for input(s): CKTOTAL, CKMB, CKMBINDEX, TROPONINI in the last 168 hours. BNP (last 3 results) No results for input(s): PROBNP in  the last 8760 hours. HbA1C: No results for input(s): HGBA1C in the last 72 hours. CBG: No results for input(s): GLUCAP in the last 168 hours. Lipid Profile: No results for input(s): CHOL, HDL, LDLCALC, TRIG, CHOLHDL, LDLDIRECT in the last 72 hours. Thyroid Function Tests: No results for input(s): TSH, T4TOTAL, FREET4, T3FREE, THYROIDAB in the last 72 hours. Anemia Panel: No results for input(s): VITAMINB12, FOLATE, FERRITIN, TIBC, IRON, RETICCTPCT in the last 72 hours. Sepsis Labs: Recent Labs  Lab 03/24/19 0400  LATICACIDVEN 1.4     Recent Results (from the past 240 hour(s))  SARS Coronavirus 2 (CEPHEID - Performed in [facility] lab), Hosp Order     Status: None   Collection Time: 03/24/19  3:30 AM   Specimen: Nasopharyngeal Swab  Result Value Ref Range Status   SARS Coronavirus 2 NEGATIVE NEGATIVE Final    Comment: (NOTE) If result is NEGATIVE SARS-CoV-2 target nucleic acids are NOT DETECTED. The SARS-CoV-2 RNA is generally detectable in upper and lower  respiratory specimens during the acute phase of infection. The lowest  concentration of SARS-CoV-2 viral copies this assay can detect is 250  copies / mL. A negative result does not preclude SARS-CoV-2 infection  and should not be used as the sole basis for treatment or other  patient management decisions.  A negative result may occur with  improper specimen collection / handling, submission of specimen other  than nasopharyngeal swab, presence of viral mutation(s) within the  areas targeted by this assay, and inadequate number of viral copies  (<250 copies / mL). A negative result must be combined with clinical  observations, patient history, and epidemiological information. If result is POSITIVE SARS-CoV-2 target nucleic acids are DETECTED. The SARS-CoV-2 RNA is generally detectable in upper and lower  respiratory specimens dur ing the acute phase of infection.  Positive  results are indicative of active infection with SARS-CoV-2.  Clinical  correlation with patient history and other diagnostic information is  necessary to determine patient infection status.  Positive results do  not rule out bacterial infection or co-infection with other viruses. If result is PRESUMPTIVE POSTIVE SARS-CoV-2 nucleic acids MAY BE PRESENT.   A presumptive positive result was obtained on the submitted specimen  and confirmed on repeat testing.  While 2019 novel coronavirus  (SARS-CoV-2) nucleic acids may be present in the submitted sample  additional confirmatory  testing may be necessary for epidemiological  and / or clinical management purposes  to differentiate between  SARS-CoV-2 and other Sarbecovirus currently known to infect humans.  If clinically indicated additional testing with an alternate test  methodology [phone]([id number]) is advised. The SARS-CoV-2 RNA is generally  detectable in upper and lower respiratory sp ecimens during the acute  phase of infection. The expected result is Negative. Fact Sheet for Patients:  BoilerBrush.com.cyhttps://www.fda.gov/media/136312/download Fact Sheet for Healthcare Providers: https://pope.com/https://www.fda.gov/media/136313/download This test is not yet approved or cleared by the [place] FDA and has been authorized for detection and/or diagnosis of SARS-CoV-2 by FDA under an Emergency Use Authorization (EUA).  This EUA will remain in effect (meaning this test can be used) for the duration of the COVID-19 declaration under Section 564(b)(1) of the Act, 21 U.S.C. section 360bbb-3(b)(1), unless the authorization is terminated or revoked sooner. Performed at [facility], [address] [place], [place] [address]   MRSA PCR Screening     Status: None   Collection Time: 03/24/19 10:57 AM   Specimen: Nasopharyngeal  Result Value Ref Range Status   MRSA by  PCR NEGATIVE NEGATIVE Final    Comment:        The GeneXpert MRSA Assay (FDA approved for NASAL specimens only), is one component of a comprehensive MRSA colonization surveillance program. It is not intended to diagnose MRSA infection nor to guide or monitor treatment for MRSA infections. Performed at [facility], [address] [place], [place] [address]       Radiology Studies: No results found.      Scheduled Meds: . budesonide (PULMICORT) nebulizer solution  0.5 mg Nebulization BID  . busPIRone  7.5 mg Oral TID  . clonazePAM  0.5 mg Oral BID  . enoxaparin (LOVENOX) injection  40 mg Subcutaneous Q24H  . ferrous sulfate  325 mg Oral Q breakfast  . folic  acid  500 mcg Oral Daily  . furosemide  40 mg Intravenous BID  . metoprolol succinate  25 mg Oral Daily  . potassium chloride SA  40 mEq Oral Daily  . QUEtiapine  50 mg Oral QHS  . sertraline  100 mg Oral Daily  . traZODone  25 mg Oral QHS   Continuous Infusions:  Assessment & Plan:    1.  Acute diastolic CHF exacerbation in the setting of valvular heart disease: Continue IV diuretics-will reduce dosage to allow BP tolerance for B-blockers that cardiology is initiating today.Monitor I's and O's/daily weights (-640 documented yesterday). Patient seen by cardiology and appreciate CT surgery evaluation with Dr. Barry Dieneswens with whom I have discussed this morning. CT surgery recommends management of #4 which could be contributing to her respiratory issues and feel she has  moderate mitral stenosis and mild to moderate mitral regurgitation with severe Pulm HTN/moderate TR. They recommend initiating coumadin to prove compliance if she were to undergo mechanical mitral valve replacement needing life long anticoagulation.   2. Acute hypoxic respiratory failure: sec to #1. Titrate 02 to off as tolerated   3.  Pulmonary HTN: sec to #1 and #4. Not on home 02. Continue current management.   4. Sarcoidosis: Start high dose Pulmicort inhaler and f/u pulmonary Dr Tonia BroomsIcard as scheduled for tissue diagnosis and consideration for long term steroids  5.  Anxiety/depression: On multiple psych meds including buspirone, clonazepam, quetiapine, sertraline and trazodone. Been somnolent but easily arousable while here. Discussed with patient regarding follow up with primary psychiatry as outpatient for medication regimen review  6.  Chronic anemia: On iron/folate supplements.  7. GERD: Add PPI or pepcid  8. Leukocytosis: Likely reactive.  Now resolved  DVT prophylaxis: lovenox Code Status: full code Family / Patient Communication: d/w patient, d/c Dr Barry Dieneswens Disposition Plan: home when medically cleared. Transfer to  telemetry     LOS: 2 days    Time spent: 35 minutes    Alessandra BevelsNeelima Catori Panozzo, MD [facility] Pager [phone]  If 7PM-7AM, please contact night-coverage [email or web address] Password [email or web address] 03/26/2019, 9:56 AM

## 2019-03-26 NOTE — Consult Note (Addendum)
Advanced Heart Failure Team Consult Note   Primary Physician: Grayce SessionsEdwards, Michelle P, NP PCP-Cardiologist:  Prentice DockerSuresh Koneswaran, MD  Reason for Consultation: CHF/pulmonary hypertension  HPI:    Theresa Crawford is seen today for evaluation of CHF, mitral stenosis, and pulmonary hypertension at the request of Dr. Cornelius Moraswen.   38 y.o. with history of mitral valve disease, diastolic CHF with prominent RV dysfunction, suspected systemic sarcoidosis, and pulmonary hypertension . In 7/14, she [place] mitral valve [place].  I am not sure what the cause of mitral regurgitation was thought to be.  Also in [place], she was told that she has sarcoidosis. She never had a biopsy.  CTA chest in 2/20 showed findings consistent with sarcoidosis. She has had chronic diastolic CHF with exacerbations over the last few years.  She has been noted to have mitral stenosis, and the need for redo MV operation was entertained in [place] before [place] moved [place] [facility] in [place].  Her grandmother has sarcoidosis, father has CHF and had an LVAD placed.   She was admitted in 6/20 with progressive dyspnea.  Echo showed EF 40-45% with concern for severe mitral stenosis and RV dysfunction.  She had a TEE which showed moderate mitral stenosis and mild MR (I reviewed this today). She had right and left heart cath showing minimally elevated filling pressures with mild, primarily pulmonary venous hypertension.  PASP was, however, 88 mmHg by echo.  Cardiac MRI showed LV EF 54%, RV EF 35% (mildly dysfunctional), and no delayed enhancment.  She was seen by [facility] (Dr. Cornelius Moraswen) in the hospital to consider redo mitral valve surgery.  She was seen by pulmonary (Dr. Tonia BroomsIcard) in the hospital.  Given her constellation of clinical findings, he did suspect that she has sarcoidosis.  He recommended avoiding systemic steroids if she were going to have a surgery soon, would start after post-op healing.  She was diuresed and discharged home.    She returned with worsening dyspnea and was found to be volume overloaded.  She has been diuresed with IV Lasix in the hospital.  She is feeling better but still with some dyspnea.     Review of Systems:  All systems reviewed and negative except as per HPI.   Home Medications Prior to Admission medications   Medication Sig Start Date End Date Taking? Authorizing Provider  albuterol (PROVENTIL HFA;VENTOLIN HFA) 108 (90 Base) MCG/ACT inhaler Inhale 2 puffs into the lungs every 6 (six) hours as needed for wheezing or shortness of breath.   Yes [provider]  busPIRone (BUSPAR) 7.5 MG tablet Take 1 tablet (7.5 mg total) by mouth 3 (three) times daily. 03/08/19  Yes Almon HerculesGonfa, Taye T, MD  clonazePAM (KLONOPIN) 0.5 MG tablet Take 1 tablet (0.5 mg total) by mouth 2 (two) times daily. 03/08/19  Yes Almon HerculesGonfa, Taye T, MD  docusate sodium (COLACE) 100 MG capsule Take 100 mg by mouth daily as needed for moderate constipation.    Yes [provider]  EPINEPHrine (EPIPEN 2-PAK) 0.3 mg/0.3 mL IJ SOAJ injection Inject 0.3 Units into the muscle once.    Yes [provider]  ferrous sulfate 325 (65 FE) MG tablet Take 1 tablet (325 mg total) by mouth 2 (two) times daily with a meal. Patient taking differently: Take 325 mg by mouth daily with breakfast.  03/08/18  Yes Philip AspenHernandez Acosta, Limmie PatriciaEstela Y, MD  folic acid (FOLVITE) 800 MCG tablet Take 400 mcg by mouth daily.   Yes [provider]  furosemide (LASIX) 40 MG tablet Take 1 tablet (40 mg total) by mouth daily. 03/09/19  Yes Almon Hercules, MD  hydrOXYzine (VISTARIL) 25 MG capsule Take 1 capsule (25 mg total) by mouth 3 (three) times daily as needed. 03/19/19  Yes Grayce Sessions, NP  metoprolol succinate (TOPROL-XL) 25 MG 24 hr tablet Take 1.5 tablets (37.5 mg total) by mouth daily. 02/28/19  Yes [name], MD  potassium chloride SA (K-DUR) 20 MEQ tablet Take 2 tablets (40 mEq total) by mouth daily. Take 2 tablets daily  02/28/19  Yes [name], MD  Probiotic Product (PROBIOTIC PO) Take 1 capsule by mouth daily.   Yes [provider]  QUEtiapine (SEROQUEL) 50 MG tablet Take 1 tablet (50 mg total) by mouth at bedtime. 04/15/18  Yes Oneta Rack, NP  sertraline (ZOLOFT) 100 MG tablet Take 1 tablet (100 mg total) by mouth daily. 03/19/19  Yes Grayce Sessions, NP  traZODone (DESYREL) 50 MG tablet Take 0.5 tablets (25 mg total) by mouth at bedtime. 04/15/18  Yes Oneta Rack, NP  vitamin B-12 (CYANOCOBALAMIN) 1000 MCG tablet Take 1,000 mcg by mouth daily.   Yes [provider]    Past Medical History: 1. Mitral valve repair: 7/14 mitral valve repair in [place], [place] for mitral regurgitation.  Now with  mitral stenosis.  - TEE (6/20): EF 45=-50%, mildly dilated RV with mildly decreased systolic function, D-shaped interventricular septum, s/p mitral valve repair with mean gradient 8 mmHg and MVA by PHT 1.85 cm^2.  This is consistent with moderate MS. Mild MR and mild-moderate TR.  2. Chronic primarily diastolic CHF: With prominent RV failure.  - Cardiac MRI (6/20) with EF 54%, septal flattening, mild RV enlargement with mildly decreased systolic function, RV EF 35%.  No myocardial late gadolinium enhancement.  - LHC/RHC (6/20): Mild nonobstructive CAD.  Mean RA 6, PA 48/20 mean 30, mean PCWP 16, LVEDP 6, CI 3.06.  3. Suspected systemic sarcoidosis: She was given this diagnosis in [place] but never had tissue diagnosis. She has hilar and pericardial lymphadenopathy, bilateral lacrimal hyperplasia with dry eyes, suspected erythema nodosum in past.  - CTA chest (2/20) with findings consistent with pulmonary sarcoidosis.  4. Pulmonary hypertension: Echo in 6/20 with PASP 88 mmHg but PA pressure 48/20 on 6/20 RHC.  5. PVCs 6. Depression: She has had a behavioral health hospitalization.   Past Surgical History: Past Surgical History:  Procedure Laterality Date   ABDOMINAL HERNIA REPAIR  ~ 2011    ABDOMINAL HYSTERECTOMY  2011   "partial"; PID w/problem with fallopian tubes, had left fallopian and left ovary removed, pt still having periods   CARDIAC CATHETERIZATION  2014   CESAREAN SECTION  2004; 2009   HERNIA REPAIR     MITRAL VALVE REPAIR  03/27/2013   Dr. Darcus Austin - [place], [place]. - complex valvuloplasty including resuspension of posterior leaflet with 30 mm CE Physio ring annuloplasty   MULTIPLE EXTRACTIONS WITH ALVEOLOPLASTY N/A 04/03/2018   Procedure: Extraction of tooth #30 with alveoloplasty and gross debridement of remaining teeth;  Surgeon: Charlynne Pander, DDS;  Location: [facility] OR;  Service: Oral Surgery;  Laterality: N/A;   PARTIAL HYSTERECTOMY  2011   PID w/problem with fallopian tubes, had left fallopian and left ovary removed, pt still having periods   RIGHT/LEFT HEART CATH AND CORONARY ANGIOGRAPHY N/A 03/05/2019   Procedure: RIGHT/LEFT HEART CATH AND CORONARY ANGIOGRAPHY;  Surgeon: Kathleene Hazel, MD;  Location: [facility] INVASIVE  CV LAB;  Service: Cardiovascular;  Laterality: N/A;   TEE WITHOUT CARDIOVERSION N/A 05/26/2016   Procedure: TRANSESOPHAGEAL ECHOCARDIOGRAM (TEE);  Surgeon: Laqueta LindenSuresh A Koneswaran, MD;  Location: [facility] ENDO SUITE;  Service: Cardiovascular;  Laterality: N/A;   TEE WITHOUT CARDIOVERSION N/A 01/25/2018   Procedure: TRANSESOPHAGEAL ECHOCARDIOGRAM (TEE) WITH PROPOFOL;  Surgeon: Jonelle SidleMcDowell, Samuel G, MD;  Location: [facility] ENDO SUITE;  Service: Cardiovascular;  Laterality: N/A;   TEE WITHOUT CARDIOVERSION N/A 03/05/2019   Procedure: TRANSESOPHAGEAL ECHOCARDIOGRAM (TEE);  Surgeon: Pricilla Riffleoss, Paula V, MD;  [facility];  Service: Cardiovascular;  Laterality: N/A;    Family History: Family History  Problem Relation Age of Onset   Heart failure Father        just received LVAD   Heart disease Paternal Grandfather        stent placement    Social History: Social History   Socioeconomic History   Marital status: Single    Spouse name:  Not on file   Number of children: 2   Years of education: Not on file   Highest education level: Not on file  Occupational History   Not on file  Social Needs   Financial resource strain: Not on file   Food insecurity    Worry: Not on file    Inability: Not on file   Transportation needs    Medical: Not on file    Non-medical: Not on file  Tobacco Use   Smoking status: Current Some Day Smoker    Packs/day: 0.25    Years: 24.00    Pack years: 6.00    Types: Cigarettes    Start date: 11/03/1999   Smokeless tobacco: Never Used  Substance and Sexual Activity   Alcohol use: Yes    Comment: 04/04/2018 "1-2 beers/month; if that"   Drug use: Not Currently   Sexual activity: Not Currently  Lifestyle   Physical activity    Days per week: Not on file    Minutes per session: Not on file   Stress: Not on file  Relationships   Social connections    Talks on phone: Not on file    Gets together: Not on file    Attends religious service: Not on file    Active member of club or organization: Not on file    Attends meetings of clubs or organizations: Not on file    Relationship status: Not on file  Other Topics Concern   Not on file  Social History Narrative   Not on file    Allergies:  Allergies  Allergen Reactions   Bee Venom Anaphylaxis   Lisinopril Anaphylaxis, Shortness Of Breath, Swelling and Other (See Comments)    Throat swells   Penicillins Shortness Of Breath, Swelling and Other (See Comments)    Has patient had a PCN reaction causing immediate rash, facial/tongue/throat swelling, SOB or lightheadedness with hypotension: Yes Has patient had a PCN reaction causing severe rash involving mucus membranes or skin necrosis: Yes Has patient had a PCN reaction that required hospitalization No Has patient had a PCN reaction occurring within the last 10 years: No If all of the above answers are "NO", then may proceed with Cephalosporin use.    Perflutren  Lipid Microsphere Shortness Of Breath and Other (See Comments)    Chest Pain/Tightness, also   Shellfish Allergy Anaphylaxis   Contrast Media [Iodinated Diagnostic Agents] Hives, Itching and Other (See Comments)    Itching and hives to throat, neck, face and arms.   No difficulty  breathing.   This was given at [facility] approximately 2015. Contrast Dye    Objective:    Vital Signs:   Temp:  [98 F (36.7 C)-99.5 F (37.5 C)] 98.1 F (36.7 C) (07/13 0300) Pulse Rate:  [42-102] 42 (07/13 1058) Resp:  [12-28] 19 (07/13 1058) BP: (97-110)/(53-78) 97/78 (07/13 1058) SpO2:  [90 %-98 %] 90 % (07/13 1058) FiO2 (%):  [32 %] 32 % (07/13 1019) Weight:  [104.1 kg] 104.1 kg (07/13 0500) Last BM Date: 03/24/19  Weight change: Filed Weights   03/24/19 0327 03/25/19 0300 03/26/19 0500  Weight: 101.2 kg 103.4 kg 104.1 kg    Intake/Output:   Intake/Output Summary (Last 24 hours) at 03/26/2019 1216 Last data filed at 03/26/2019 1100 Gross per 24 hour  Intake 1480 ml  Output 2300 ml  Net -820 ml      Physical Exam    General:  Well appearing. No resp difficulty HEENT: normal Neck: supple. JVP 10-11 cm. Carotids 2+ bilat; no bruits. No lymphadenopathy or thyromegaly appreciated. Cor: PMI nondisplaced. Regular rate & rhythm. No rubs, gallops. 2/6 HSM LLSB.  Lungs: Crackles at bases bilaterally.  Abdomen: soft, nontender, nondistended. No hepatosplenomegaly. No bruits or masses. Good bowel sounds. Extremities: no cyanosis, clubbing, rash. Trace ankle edema.  Neuro: alert & orientedx3, cranial nerves grossly intact. moves all 4 extremities w/o difficulty. Affect pleasant   Telemetry   NSR 90s (personally reviewed)  EKG    NSR with ventricular bigeminy (personally reviewed)  Labs   Basic Metabolic Panel: Recent Labs  Lab 03/24/19 0400 03/25/19 0206 03/26/19 0253  NA 138 138 137  K 3.1* 3.8 3.8  CL 106 104 103  CO2 24 26 27   GLUCOSE 111* 95 131*  BUN 10  11 12   CREATININE 0.84 0.91 0.90  CALCIUM 8.1* 8.5* 8.8*  MG 1.9  --   --     Liver Function Tests: Recent Labs  Lab 03/24/19 0400  AST 15  ALT 12  ALKPHOS 64  BILITOT 0.8  PROT 7.3  ALBUMIN 3.7   No results for input(s): LIPASE, AMYLASE in the last 168 hours. No results for input(s): AMMONIA in the last 168 hours.  CBC: Recent Labs  Lab 03/24/19 0400 03/25/19 0206  WBC 17.0* 8.1  NEUTROABS 14.1*  --   HGB 10.4* 10.1*  HCT 33.9* 34.0*  MCV 82.7 84.4  PLT 316 291    Cardiac Enzymes: No results for input(s): CKTOTAL, CKMB, CKMBINDEX, TROPONINI in the last 168 hours.  BNP: BNP (last 3 results) Recent Labs    11/06/18 0051 03/03/19 1914 03/24/19 0326  BNP 440.0* 528.0* 444.0*    ProBNP (last 3 results) No results for input(s): PROBNP in the last 8760 hours.   CBG: No results for input(s): GLUCAP in the last 168 hours.  Coagulation Studies: No results for input(s): LABPROT, INR in the last 72 hours.   Imaging    No results found.   Medications:     Current Medications:  budesonide (PULMICORT) nebulizer solution  0.5 mg Nebulization BID   busPIRone  7.5 mg Oral TID   clonazePAM  0.5 mg Oral BID   coumadin book   Does not apply Once   enoxaparin (LOVENOX) injection  40 mg Subcutaneous Q24H   ferrous sulfate  325 mg Oral Q breakfast   folic acid  500 mcg Oral Daily   furosemide  60 mg Intravenous BID   metoprolol succinate  25 mg Oral BID   potassium  chloride SA  40 mEq Oral Daily   QUEtiapine  50 mg Oral QHS   sertraline  100 mg Oral Daily   sodium chloride flush  3 mL Intravenous Q12H   traZODone  25 mg Oral QHS   warfarin  2 mg Oral q1800   Warfarin - Physician Dosing Inpatient   Does not apply q1800     Infusions:    Assessment/Plan   1. Mitral valve disease:  Patient had mitral valve repair for MR in 7/14 in [place].  Underlying lesion uncertain, but I suspect rheumatic heart disease versus possibly  sarcoidosis itself.   I reviewed the recent TEE from 6/20. She has mild, eccentric MR.  The mitral valve has been repaired, mean gradient 8 mmHg with MVA 1.85 cm^2 by PHT.  Visually, the valve appears stenosed.  Overall, the stenosis appears moderate. I am not sure how much the mitral stenosis contributes to her dyspnea versus the sarcoidosis.  It may be that she is not symptomatically a lot better after MV replacement, but she certainly has at least moderate mitral stenosis based on imaging.  Surprisingly, her [place] in 6/20 was not particularly impressive, she had mild pulmonary venous hypertension (though PASP 88 mmHg by echo).  She has not had documented atrial fibrillation.  On exam today, she does look volume overloaded still.  - Would give Lasix 60 mg IV bid today.   - Increase Toprol XL to 25 mg bid with HR relatively elevated HR in setting of mitral stenosis.  - I am going to repeat RHC tomorrow after further diuresis to reassess her filling pressures and PA pressure. I discussed risks/benefits with patient and she agreed to procedure.   - She may need mitral valve replacement.  If so, should have mechanical valve.  Agree with trial of warfarin when she goes home to see if she can tolerate it and is compliant with INR followup.  2. Acute on chronic diastolic CHF with prominent RV failure on imaging: TEE in 6/20 with EF 45-50%, mildly dilated/dysfunctional RV with D-shaped septum, moderate mitral stenosis.  The cardiac MRI showed LVEF 54%, RVEF 35%, and no myocardial or pericardial delayed enhancement (no evidence for cardiac sarcoidosis).  It is possible that the RV findings are from mitral stenosis.  However, she also could have pulmonary hypertension/RV failure from pulmonary sarcoidosis.  - As above, increase Lasix today and I will repeat RHC tomorrow.  3. Pulmonary hypertension: PASP 88 mmHg on TTE, but RHC in 6/20 showed only mild pulmonary venous hypertension.  Could have significant PAH in  setting of sarcoidosis, would expect primarily pulmonary venous hypertension with mitral stenosis unless there has been pulmonary vascular remodeling.  - As above, RHC tomorrow.   4. Sarcoidosis: Strongly suspected based on lung imaging, probable erythema nodosum, and lacrimal hyperplasia with dry eyes. No biopsy data.  - If she has cardiac surgery, would need biopsy of lymphadenopathy.  - Pulmicort begun.  See above discussion about holding off on systemic steroids until she is past the post-op period if she has mitral surgery.   Length of Stay: 2  Loralie Champagne, MD  03/26/2019, 12:16 PM  Advanced Heart Failure Team Pager [phone] (M-F; 7a - 4p)  Please contact [facility] for night-coverage after hours (4p -7a ) and weekends on [email or web address]

## 2019-03-26 NOTE — Progress Notes (Addendum)
[address]       [place] [id number]             [name]     CARDIOTHORACIC SURGERY PROGRESS NOTE  Subjective: Patient reports feeling better than when she was readmitted.  She states that she had been compliant taking medications at home and even took extra doses of lasix prior to presenting to ED.  She states that initially she was having trouble with cough and as cough got worse she developed SOB  Objective: Vital signs in last 24 hours: Temp:  [98 F (36.7 C)-99.5 F (37.5 C)] 98.1 F (36.7 C) (07/13 0300) Pulse Rate:  [96-103] 96 (07/13 0600) Cardiac Rhythm: Normal sinus rhythm (07/13 0700) Resp:  [12-30] 24 (07/13 0600) BP: (92-110)/(49-72) 104/72 (07/13 0300) SpO2:  [96 %-98 %] 98 % (07/13 0600) Weight:  [104.1 kg] 104.1 kg (07/13 0500)  Physical Exam:  Rhythm:   sinus  Breath sounds: Fairly clear  Heart sounds:  Distant, no murmur noted  Incisions:  n/a  Abdomen:  soft  Extremities:  warm   Intake/Output from previous day: 07/12 0701 - 07/13 0700 In: 1360 [P.O.:1360] Out: 2000 [Urine:2000] Intake/Output this shift: No intake/output data recorded.  Lab Results: Recent Labs    03/24/19 0400 03/25/19 0206  WBC 17.0* 8.1  HGB 10.4* 10.1*  HCT 33.9* 34.0*  PLT 316 291   BMET:  Recent Labs    03/25/19 0206 03/26/19 0253  NA 138 137  K 3.8 3.8  CL 104 103  CO2 26 27  GLUCOSE 95 131*  BUN 11 12  CREATININE 0.91 0.90  CALCIUM 8.5* 8.8*    CBG (last 3)  No results for input(s): GLUCAP in the last 72 hours. PT/INR:  No results for input(s): LABPROT, INR in the last 72 hours.  CXR:  PORTABLE CHEST 1 VIEW  COMPARISON:  03/03/2019, 11/06/2018  FINDINGS: Post sternotomy changes and valve prosthesis. Cardiomegaly with vascular congestion. Interim worsening of diffuse bilateral interstitial and ground-glass opacity. No pleural effusion. No pneumothorax.  IMPRESSION: Cardiomegaly with vascular congestion and interval  worsening of diffuse bilateral interstitial and ground-glass opacity, suspicious for pulmonary edema.   Electronically Signed   By: Jasmine PangKim  Fujinaga M.D.   On: 03/24/2019 03:58  Assessment/Plan:  Patient is well-known to me.  Please refer to consult note from 03/07/2019 for details.  During her last hospitalization she was also seen in consultation by Dr. Tonia BroomsIcard from pulmonary medicine who is convinced that the patient has systemic sarcoidosis, although she has never had any type of biopsy to prove it.  She did undergo cardiac MRI during that hospitalization which did not reveal late gadolinium enhancement suggestive of diffuse myocardial involvement.  Dr. Tonia BroomsIcard felt the patient would likely need to be treated with systemic steroids but felt that this should wait until the patient had mitral valve surgery if surgical treatment of the patient's mitral valve disease was planned.  He requested lymph node biopsy be performed at the time.  Questions at this time include whether or not the patient should undergo mitral valve replacement with or without concomitant tricuspid valve repair.  By all diagnostic criteria the patient has moderate mitral stenosis and mild to moderate mitral regurgitation.  Previous echoes have demonstrated signs of significant pulmonary hypertension and RV overload with moderate tricuspid regurgitation as well.  She might benefit from mitral valve replacement and tricuspid valve repair, but it will not eliminate her problems with chronic hypoxic  respiratory insufficiency and chronic diastolic and right ventricular heart failure.  Moreover, surgical intervention will mandate replacement of the patient's mitral valve using a mechanical prosthetic valve because of her young age.  If she cannot demonstrate compliance with long-term anticoagulation using warfarin she would probably be better served without any type of surgical intervention.  As a neck step I feel that the patient should be  evaluated by the advanced heart failure team as regardless of whether or not surgery is planned she will need to be managed long-term using an aggressive, team based approach.  In addition, it might be reasonable to initiate empiric treatment using inhaled corticosteroids - discussed with Dr Valeta Harms who recommends high dose Pulmicort inhaler.  Finally, I recommend initiation of long-term warfarin anticoagulation to make sure that the patient can demonstrate tolerance and compliance prior to proceeding with a surgical intervention which will mandate long-term anticoagulation.   I spent in excess of 15 minutes during the conduct of this hospital encounter and >50% of this time involved direct face-to-face encounter with the patient for counseling and/or coordination of their care.      Rexene Alberts, MD 03/26/2019 9:23 AM

## 2019-03-26 NOTE — Progress Notes (Signed)
CSW called Linna Hoff with [facility] to see if they would be able to provide the home health nurse for the patient. Linna Hoff ran the patient's medicaid, she has family medicaid. The patient will not qualify for home health. CSW asked if the patient could have charity home health. Linna Hoff stated that family medicaid counts as insurance and the patient cannot get charity care if she has insurance.   CSW notified RNCM. RNCM stated to inform nurse and MD. The patient will have to bed educated prior to discharge from the hospital.   [place] will continue to follow.   Domenic Schwab, MSW, [place] Worker [facility]  [phone]

## 2019-03-26 NOTE — Progress Notes (Signed)
Patient not requiring BIPAP at this time. No distress noted.

## 2019-03-27 ENCOUNTER — Encounter (HOSPITAL_COMMUNITY): Payer: Self-pay | Admitting: Cardiology

## 2019-03-27 ENCOUNTER — Encounter (HOSPITAL_COMMUNITY)
Admission: EM | Disposition: A | Discharge status: Home Health | Payer: Self-pay | Source: Home / Self Care | Attending: Internal Medicine

## 2019-03-27 DIAGNOSIS — D649 Anemia, unspecified: Secondary | ICD-10-CM

## 2019-03-27 DIAGNOSIS — I272 Pulmonary hypertension, unspecified: Secondary | ICD-10-CM

## 2019-03-27 DIAGNOSIS — I509 Heart failure, unspecified: Secondary | ICD-10-CM

## 2019-03-27 HISTORY — PX: RIGHT HEART CATH: CATH118263

## 2019-03-27 LAB — POCT I-STAT EG7
Acid-Base Excess: 3 mmol/L — ABNORMAL HIGH (ref 0.0–2.0)
Acid-Base Excess: 5 mmol/L — ABNORMAL HIGH (ref 0.0–2.0)
Bicarbonate: 29.8 mmol/L — ABNORMAL HIGH (ref 20.0–28.0)
Bicarbonate: 31.1 mmol/L — ABNORMAL HIGH (ref 20.0–28.0)
Calcium, Ion: 1.2 mmol/L (ref 1.15–1.40)
Calcium, Ion: 1.27 mmol/L (ref 1.15–1.40)
HCT: 36 % (ref 36.0–46.0)
HCT: 37 % (ref 36.0–46.0)
Hemoglobin: 12.2 g/dL (ref 12.0–15.0)
Hemoglobin: 12.6 g/dL (ref 12.0–15.0)
O2 Saturation: 68 %
O2 Saturation: 69 %
Potassium: 4.2 mmol/L (ref 3.5–5.1)
Potassium: 4.3 mmol/L (ref 3.5–5.1)
Sodium: 141 mmol/L (ref 135–145)
Sodium: 143 mmol/L (ref 135–145)
TCO2: 31 mmol/L (ref 22–32)
TCO2: 33 mmol/L — ABNORMAL HIGH (ref 22–32)
pCO2, Ven: 53.1 mmHg (ref 44.0–60.0)
pCO2, Ven: 54.1 mmHg (ref 44.0–60.0)
pH, Ven: 7.357 (ref 7.250–7.430)
pH, Ven: 7.368 (ref 7.250–7.430)
pO2, Ven: 38 mmHg (ref 32.0–45.0)
pO2, Ven: 38 mmHg (ref 32.0–45.0)

## 2019-03-27 LAB — BASIC METABOLIC PANEL
Anion gap: 9 (ref 5–15)
BUN: 14 mg/dL (ref 6–20)
CO2: 26 mmol/L (ref 22–32)
Calcium: 9.2 mg/dL (ref 8.9–10.3)
Chloride: 104 mmol/L (ref 98–111)
Creatinine, Ser: 1.11 mg/dL — ABNORMAL HIGH (ref 0.44–1.00)
GFR calc Af Amer: >60 mL/min (ref >60)
GFR calc non Af Amer: >60 mL/min (ref >60)
Glucose, Bld: 98 mg/dL (ref 70–99)
Potassium: 3.8 mmol/L (ref 3.5–5.1)
Sodium: 139 mmol/L (ref 135–145)

## 2019-03-27 LAB — CBC
HCT: 35.7 % — ABNORMAL LOW (ref 36.0–46.0)
Hemoglobin: 10.9 g/dL — ABNORMAL LOW (ref 12.0–15.0)
MCH: 25.2 pg — ABNORMAL LOW (ref 26.0–34.0)
MCHC: 30.5 g/dL (ref 30.0–36.0)
MCV: 82.4 fL (ref 80.0–100.0)
Platelets: 368 10*3/uL (ref 150–400)
RBC: 4.33 MIL/uL (ref 3.87–5.11)
RDW: 20.2 % — ABNORMAL HIGH (ref 11.5–15.5)
WBC: 7.1 10*3/uL (ref 4.0–10.5)
nRBC: 0 % (ref 0.0–0.2)

## 2019-03-27 LAB — PROTIME-INR
INR: 1.2 (ref 0.8–1.2)
Prothrombin Time: 14.6 seconds (ref 11.4–15.2)

## 2019-03-27 LAB — BRAIN NATRIURETIC PEPTIDE: B Natriuretic Peptide: 163.2 pg/mL — ABNORMAL HIGH (ref 0.0–100.0)

## 2019-03-27 SURGERY — RIGHT HEART CATH
Anesthesia: LOCAL

## 2019-03-27 MED ORDER — FUROSEMIDE 10 MG/ML IJ SOLN
60.0000 mg | Freq: Two times a day (BID) | INTRAMUSCULAR | Status: AC
  Administered 2019-03-27: 60 mg via INTRAVENOUS
  Filled 2019-03-27: qty 6

## 2019-03-27 MED ORDER — HEPARIN (PORCINE) IN NACL 1000-0.9 UT/500ML-% IV SOLN
INTRAVENOUS | Status: AC
  Filled 2019-03-27: qty 500

## 2019-03-27 MED ORDER — MIDAZOLAM HCL 2 MG/2ML IJ SOLN
INTRAMUSCULAR | Status: DC | PRN
  Administered 2019-03-27: 1 mg via INTRAVENOUS

## 2019-03-27 MED ORDER — LIDOCAINE HCL (PF) 1 % IJ SOLN
INTRAMUSCULAR | Status: AC
  Filled 2019-03-27: qty 30

## 2019-03-27 MED ORDER — LIDOCAINE HCL (PF) 1 % IJ SOLN
INTRAMUSCULAR | Status: DC | PRN
  Administered 2019-03-27: 2 mL via INTRADERMAL

## 2019-03-27 MED ORDER — FENTANYL CITRATE (PF) 100 MCG/2ML IJ SOLN
INTRAMUSCULAR | Status: DC | PRN
  Administered 2019-03-27: 25 ug via INTRAVENOUS

## 2019-03-27 MED ORDER — MIDAZOLAM HCL 2 MG/2ML IJ SOLN
INTRAMUSCULAR | Status: AC
  Filled 2019-03-27: qty 2

## 2019-03-27 MED ORDER — HEPARIN (PORCINE) IN NACL 1000-0.9 UT/500ML-% IV SOLN
INTRAVENOUS | Status: DC | PRN
  Administered 2019-03-27: 500 mL

## 2019-03-27 MED ORDER — FENTANYL CITRATE (PF) 100 MCG/2ML IJ SOLN
INTRAMUSCULAR | Status: AC
  Filled 2019-03-27: qty 2

## 2019-03-27 SURGICAL SUPPLY — 6 items
CATH BALLN WEDGE 5F 110CM (CATHETERS) ×1 IMPLANT
KIT HEART LEFT (KITS) ×2 IMPLANT
PACK CARDIAC CATHETERIZATION (CUSTOM PROCEDURE TRAY) ×2 IMPLANT
SHEATH GLIDE SLENDER 4/5FR (SHEATH) ×1 IMPLANT
TRANSDUCER W/STOPCOCK (MISCELLANEOUS) ×2 IMPLANT
TUBING CIL FLEX 10 FLL-RA (TUBING) ×2 IMPLANT

## 2019-03-27 NOTE — Progress Notes (Signed)
Spoke with [facility], patient just left for the procedure, stated that they are going to placed IV in cath lab.

## 2019-03-27 NOTE — Interval H&P Note (Signed)
History and Physical Interval Note:  03/27/2019 12:39 PM  Theresa Crawford  has presented today for surgery, with the diagnosis of heart failure.  The various methods of treatment have been discussed with the patient and family. After consideration of risks, benefits and other options for treatment, the patient has consented to  Procedure(s): RIGHT HEART CATH (N/A) as a surgical intervention.  The patient's history has been reviewed, patient examined, no change in status, stable for surgery.  I have reviewed the patient's chart and labs.  Questions were answered to the patient's satisfaction.     [place] [employer]

## 2019-03-27 NOTE — Progress Notes (Addendum)
PROGRESS NOTE  Theresa Crawford ZOX:[id number] DOB: September 17, 1980 DOA: 03/24/2019 PCP: GJanalee Danerayce SessionsEdwards, Michelle P, NP   LOS: 3 days   Brief narrative: 38 year old female with history of hypertension, anxiety/depression, chronic back pain, GERD, sarcoidosis and severe valvular heart disease (mitral valve stenosis status post repair, tricuspid regurgitation) with associated pulmonary hypertension who was recently [facility] in June for diastolic CHF secondary to mitral valve restenosis and discharged on diuretics with instructions to follow-up CT surgery as outpatient, now presents back with shortness of breath and noted to be in flash pulmonary edema/acute respiratory failure requiring BiPAP/high flow O2. Patient transferred from [facility] on 7/11 on 10 L high flow O2. IV Lasix 40 mg twice daily upon admission and cardiology consulted.  Patient has been seen by cardiology and cardiothoracic surgery at this time.  Subjective: Patient denies any chest pain, has improved no shortness of breath.  Currently comfortable.  Denies fever, chills or rigor.  Assessment/Plan:  Principal Problem:   Acute on chronic diastolic congestive heart failure (HCC) Active Problems:   Acute respiratory failure with hypoxia (HCC)   Hypertension   Flash pulmonary edema (HCC)   Depression with anxiety   Recurrent mitral valve stenosis and regurgitation s/p mitral valve repair   Hypokalemia   Pulmonary hypertension (HCC)   Tobacco abuse   Normocytic anemia   Acute diastolic CHF exacerbation in the setting of valvular heart disease: on  IV diuretics.  Heart failure team has seen the patient.  Will follow heart failure recommendations.  Continue Toprol-XL.  Patient is a scheduled to undergo right heart catheterization today. Patient hasmoderate mitral stenosis and mild to moderate mitral regurgitation with severe Pulm HTN pulmonary artery systolic pressure of 88 mmHg on TTE/moderate TR. possibility  that the patient will need mitral valve replacement so Coumadin has been started.  Acute hypoxic respiratory failure secondary to congestive heart failure.  We will continue to wean oxygen as tolerated.   3.  Pulmonary HTN:  Due to valvular heart disease.  Awaiting for right-sided heart catheterization today.  Continue current management.   4.  Possible sarcoidosis: Started high dose Pulmicort inhaler and f/u pulmonary Dr Tonia BroomsIcard as scheduled for tissue diagnosis and consideration for long term steroids  5.  Anxiety/depression: On multiple psych meds including buspirone, clonazepam, quetiapine, sertraline and trazodone.  Will need primary psychiatry as outpatient for medication regimen review  6.  Chronic anemia: On iron/folate supplements.  7. GERD: Add PPI or pepcid  8. Leukocytosis: Likely reactive.    Resolved at this time   VTE Prophylaxis:  coumadin  Code Status: Full code  Family Communication: Discussed with the patient.  Disposition Plan: Home.  Unknown at this time.  Cardiothoracic surgery and cardiology/heart failure team on board.  Will follow recommendations.   Consultants:  Cardiology, cardiothoracic surgery, heart failure team  Procedures:  Plan for right heart catheterization today  Antibiotics: Anti-infectives (From admission, onward)   None      Objective: Vitals:   03/27/19 1026 03/27/19 1053  BP: 102/63 (!) 102/58  Pulse: 74 72  Resp:  (!) 21  Temp:  97.9 F (36.6 C)  SpO2:  97%    Intake/Output Summary (Last 24 hours) at 03/27/2019 1106 Last data filed at 03/27/2019 1029 Gross per 24 hour  Intake 963 ml  Output 2250 ml  Net -1287 ml   Filed Weights   03/24/19 0327 03/25/19 0300 03/26/19 0500  Weight: 101.2 kg 103.4 kg 104.1 kg   Body  mass index is 39.39 kg/m.   Physical Exam: GENERAL: Patient is alert awake and oriented. Not in obvious distress.  Nasal cannula oxygen HENT: No scleral pallor or icterus. Pupils equally reactive  to light. Oral mucosa is moist NECK: is supple, no palpable thyroid enlargement. CHEST: Diminished breath sounds bilaterally. CVS: S1 and S2 heard, systolic murmur. Regular rate and rhythm. No pericardial rub. ABDOMEN: Soft, non-tender, bowel sounds are present. No palpable hepato-splenomegaly. EXTREMITIES: Trace edema. CNS: Cranial nerves are intact. No focal motor or sensory deficits. SKIN: warm and dry without rashes.  Data Review: I have personally reviewed the following laboratory data and studies,  CBC: Recent Labs  Lab 03/24/19 0400 03/25/19 0206  WBC 17.0* 8.1  NEUTROABS 14.1*  --   HGB 10.4* 10.1*  HCT 33.9* 34.0*  MCV 82.7 84.4  PLT 316 291   Basic Metabolic Panel: Recent Labs  Lab 03/24/19 0400 03/25/19 0206 03/26/19 0253 03/27/19 0427  NA 138 138 137 139  K 3.1* 3.8 3.8 3.8  CL 106 104 103 104  CO2 24 26 27 26   GLUCOSE 111* 95 131* 98  BUN 10 11 12 14   CREATININE 0.84 0.91 0.90 1.11*  CALCIUM 8.1* 8.5* 8.8* 9.2  MG 1.9  --   --   --    Liver Function Tests: Recent Labs  Lab 03/24/19 0400  AST 15  ALT 12  ALKPHOS 64  BILITOT 0.8  PROT 7.3  ALBUMIN 3.7   No results for input(s): LIPASE, AMYLASE in the last 168 hours. No results for input(s): AMMONIA in the last 168 hours. Cardiac Enzymes: No results for input(s): CKTOTAL, CKMB, CKMBINDEX, TROPONINI in the last 168 hours. BNP (last 3 results) Recent Labs    11/06/18 0051 03/03/19 1914 03/24/19 0326  BNP 440.0* 528.0* 444.0*    ProBNP (last 3 results) No results for input(s): PROBNP in the last 8760 hours.  CBG: No results for input(s): GLUCAP in the last 168 hours. Recent Results (from the past 240 hour(s))  SARS Coronavirus 2 (CEPHEID - Performed in [facility] hospital lab), Hosp Order     Status: None   Collection Time: 03/24/19  3:30 AM   Specimen: Nasopharyngeal Swab  Result Value Ref Range Status   SARS Coronavirus 2 NEGATIVE NEGATIVE Final    Comment: (NOTE) If result is  NEGATIVE SARS-CoV-2 target nucleic acids are NOT DETECTED. The SARS-CoV-2 RNA is generally detectable in upper and lower  respiratory specimens during the acute phase of infection. The lowest  concentration of SARS-CoV-2 viral copies this assay can detect is 250  copies / mL. A negative result does not preclude SARS-CoV-2 infection  and should not be used as the sole basis for treatment or other  patient management decisions.  A negative result may occur with  improper specimen collection / handling, submission of specimen other  than nasopharyngeal swab, presence of viral mutation(s) within the  areas targeted by this assay, and inadequate number of viral copies  (<250 copies / mL). A negative result must be combined with clinical  observations, patient history, and epidemiological information. If result is POSITIVE SARS-CoV-2 target nucleic acids are DETECTED. The SARS-CoV-2 RNA is generally detectable in upper and lower  respiratory specimens dur ing the acute phase of infection.  Positive  results are indicative of active infection with SARS-CoV-2.  Clinical  correlation with patient history and other diagnostic information is  necessary to determine patient infection status.  Positive results do  not rule out bacterial  infection or co-infection with other viruses. If result is PRESUMPTIVE POSTIVE SARS-CoV-2 nucleic acids MAY BE PRESENT.   A presumptive positive result was obtained on the submitted specimen  and confirmed on repeat testing.  While 2019 novel coronavirus  (SARS-CoV-2) nucleic acids may be present in the submitted sample  additional confirmatory testing may be necessary for epidemiological  and / or clinical management purposes  to differentiate between  SARS-CoV-2 and other Sarbecovirus currently known to infect humans.  If clinically indicated additional testing with an alternate test  methodology [phone]) is advised. The SARS-CoV-2 RNA is generally  detectable  in upper and lower respiratory sp ecimens during the acute  phase of infection. The expected result is Negative. Fact Sheet for Patients:  StrictlyIdeas.no Fact Sheet for Healthcare Providers: BankingDealers.co.za This test is not yet approved or cleared by the [place] FDA and has been authorized for detection and/or diagnosis of SARS-CoV-2 by FDA under an Emergency Use Authorization (EUA).  This EUA will remain in effect (meaning this test can be used) for the duration of the COVID-19 declaration under Section 564(b)(1) of the Act, 21 U.S.C. section 360bbb-3(b)(1), unless the authorization is terminated or revoked sooner. Performed at [facility], [address] [place], [place] [address]   MRSA PCR Screening     Status: None   Collection Time: 03/24/19 10:57 AM   Specimen: Nasopharyngeal  Result Value Ref Range Status   MRSA by PCR NEGATIVE NEGATIVE Final    Comment:        The GeneXpert MRSA Assay (FDA approved for NASAL specimens only), is one component of a comprehensive MRSA colonization surveillance program. It is not intended to diagnose MRSA infection nor to guide or monitor treatment for MRSA infections. Performed at [facility], [place] [address], [place], [place] [address]      Studies: No results found.  Scheduled Meds: . budesonide (PULMICORT) nebulizer solution  0.5 mg Nebulization BID  . busPIRone  7.5 mg Oral TID  . clonazePAM  0.5 mg Oral BID  . enoxaparin (LOVENOX) injection  40 mg Subcutaneous Q24H  . ferrous sulfate  325 mg Oral Q breakfast  . folic acid  144 mcg Oral Daily  . furosemide  60 mg Intravenous BID  . metoprolol succinate  25 mg Oral BID  . patient's guide to using coumadin book   Does not apply Once  . potassium chloride SA  40 mEq Oral Daily  . QUEtiapine  50 mg Oral QHS  . sertraline  100 mg Oral Daily  . sodium chloride flush  3 mL Intravenous Q12H  . traZODone  25 mg Oral  QHS  . Warfarin - Physician Dosing Inpatient   Does not apply q1800    Continuous Infusions: . sodium chloride    . sodium chloride       Flora Lipps, MD  [facility] 03/27/2019

## 2019-03-27 NOTE — H&P (View-Only) (Signed)
Patient ID: Theresa Crawford, female   DOB: 1981-02-21, 38 y.o.   MRN: [id number]     Advanced Heart Failure Rounding Note  PCP-Cardiologist: Kate Sable, MD   Subjective:    Breathing better today.  I/Os seem incomplete, she says she urinated a lot last night.  No weight yet.  Labs still pending.    Objective:   Weight Range: 104.1 kg Body mass index is 39.39 kg/m.   Vital Signs:   Temp:  [97.8 F (36.6 C)-98.2 F (36.8 C)] 98 F (36.7 C) (07/14 0727) Pulse Rate:  [29-90] 76 (07/14 0727) Resp:  [18-25] 23 (07/14 0727) BP: (86-105)/(53-78) 102/63 (07/14 0727) SpO2:  [87 %-98 %] 95 % (07/14 0813) FiO2 (%):  [32 %] 32 % (07/13 1019) Last BM Date: 03/24/19  Weight change: Filed Weights   03/24/19 0327 03/25/19 0300 03/26/19 0500  Weight: 101.2 kg 103.4 kg 104.1 kg    Intake/Output:   Intake/Output Summary (Last 24 hours) at 03/27/2019 0939 Last data filed at 03/27/2019 0925 Gross per 24 hour  Intake 960 ml  Output 2950 ml  Net -1990 ml      Physical Exam    General:  Well appearing. No resp difficulty HEENT: Normal Neck: Supple. JVP 8-9 cm. Carotids 2+ bilat; no bruits. No lymphadenopathy or thyromegaly appreciated. Cor: PMI nondisplaced. Regular rate & rhythm. No rubs, gallops. 2/6 SEM RUSB.  Lungs: Clear Abdomen: Soft, nontender, nondistended. No hepatosplenomegaly. No bruits or masses. Good bowel sounds. Extremities: No cyanosis, clubbing, rash, edema Neuro: Alert & orientedx3, cranial nerves grossly intact. moves all 4 extremities w/o difficulty. Affect pleasant   Telemetry   NSR 70s (personally reviewed).   Labs    CBC Recent Labs    03/25/19 0206  WBC 8.1  HGB 10.1*  HCT 34.0*  MCV 84.4  PLT 101   Basic Metabolic Panel Recent Labs    03/25/19 0206 03/26/19 0253  NA 138 137  K 3.8 3.8  CL 104 103  CO2 26 27  GLUCOSE 95 131*  BUN 11 12  CREATININE 0.91 0.90  CALCIUM 8.5* 8.8*   Liver Function Tests No results for input(s):  AST, ALT, ALKPHOS, BILITOT, PROT, ALBUMIN in the last 72 hours. No results for input(s): LIPASE, AMYLASE in the last 72 hours. Cardiac Enzymes No results for input(s): CKTOTAL, CKMB, CKMBINDEX, TROPONINI in the last 72 hours.  BNP: BNP (last 3 results) Recent Labs    11/06/18 0051 03/03/19 1914 03/24/19 0326  BNP 440.0* 528.0* 444.0*    ProBNP (last 3 results) No results for input(s): PROBNP in the last 8760 hours.   D-Dimer No results for input(s): DDIMER in the last 72 hours. Hemoglobin A1C No results for input(s): HGBA1C in the last 72 hours. Fasting Lipid Panel No results for input(s): CHOL, HDL, LDLCALC, TRIG, CHOLHDL, LDLDIRECT in the last 72 hours. Thyroid Function Tests No results for input(s): TSH, T4TOTAL, T3FREE, THYROIDAB in the last 72 hours.  Invalid input(s): FREET3  Other results:   Imaging     No results found.   Medications:     Scheduled Medications: . budesonide (PULMICORT) nebulizer solution  0.5 mg Nebulization BID  . busPIRone  7.5 mg Oral TID  . clonazePAM  0.5 mg Oral BID  . enoxaparin (LOVENOX) injection  40 mg Subcutaneous Q24H  . ferrous sulfate  325 mg Oral Q breakfast  . folic acid  751 mcg Oral Daily  . furosemide  60 mg Intravenous BID  . metoprolol succinate  25 mg Oral BID  . patient's guide to using coumadin book   Does not apply Once  . potassium chloride SA  40 mEq Oral Daily  . QUEtiapine  50 mg Oral QHS  . sertraline  100 mg Oral Daily  . sodium chloride flush  3 mL Intravenous Q12H  . traZODone  25 mg Oral QHS  . Warfarin - Physician Dosing Inpatient   Does not apply q1800     Infusions: . sodium chloride    . sodium chloride       PRN Medications:  sodium chloride, acetaminophen **OR** acetaminophen, albuterol, fluticasone, hydrOXYzine, sodium chloride flush   Assessment/Plan   1. Mitral valve disease:  Patient had mitral valve [place]  Underlying lesion uncertain, but I  suspect rheumatic heart disease versus possibly sarcoidosis itself.   I reviewed the recent TEE from 6/20. She has mild, eccentric MR.  The mitral valve has been repaired, mean gradient 8 mmHg with MVA 1.85 cm^2 by PHT.  Visually, the valve appears stenosed.  Overall, the stenosis appears moderate. I am not sure how much the mitral stenosis contributes to her dyspnea versus the sarcoidosis.  It may be that she is not symptomatically a lot better after MV replacement, but she certainly has at least moderate mitral stenosis based on imaging.  Surprisingly, her RHC in 6/20 was not particularly impressive, she had mild pulmonary venous hypertension (though PASP 88 mmHg by echo).  She has not had documented atrial fibrillation.  She says she diuresed well yesterday, suspect I/Os not accurate and no weight yet.  Mild volume overload still on exam.  - Would give Lasix 60 mg IV bid today, probably to po tomorrow.   - Increased Toprol XL to 25 mg bid to keep HR down in setting of mitral stenosis, rate 70s today.  - I am going to repeat RHC today to reassess her filling pressures and PA pressure. I discussed risks/benefits with patient and she agrees to procedure.   - She may need mitral valve replacement.  If so, should have mechanical valve.  Agree with trial of warfarin when she goes home to see if she can tolerate it and is compliant with INR followup. Warfarin has been started.  2. Acute on chronic diastolic CHF with prominent RV failure on imaging: TEE in 6/20 with EF 45-50%, mildly dilated/dysfunctional RV with D-shaped septum, moderate mitral stenosis.  The cardiac MRI showed LVEF 54%, RVEF 35%, and no myocardial or pericardial delayed enhancement (no evidence for cardiac sarcoidosis).  It is possible that the RV findings are from mitral stenosis.  However, she also could have pulmonary hypertension/RV failure from pulmonary sarcoidosis.  - As above, continue IV Lasix today and I will repeat RHC.   3. Pulmonary  hypertension: PASP 88 mmHg on TTE, but RHC in 6/20 showed only mild pulmonary venous hypertension.  Could have significant PAH in setting of sarcoidosis, would expect primarily pulmonary venous hypertension with mitral stenosis unless there has been pulmonary vascular remodeling.  - As above, RHC.   4. Sarcoidosis: Strongly suspected based on lung imaging, probable erythema nodosum, and lacrimal hyperplasia with dry eyes. No biopsy data.  - If she has cardiac surgery, would need biopsy of lymphadenopathy.  - Pulmicort begun.  See above discussion about holding off on systemic steroids until she is past the post-op period if she has mitral surgery.   Length of Stay: 3  Marca Anconaalton Boleslaus Holloway, MD  03/27/2019, 9:39 AM  Advanced Heart Failure Team Pager [phone] (M-F; [place])  Please contact [facility] for night-coverage after hours (4p -7a ) and weekends on [email or web address]

## 2019-03-27 NOTE — Progress Notes (Signed)
Patient ID: Theresa Crawford, female   DOB: 1981-02-21, 38 y.o.   MRN: [id number]     Advanced Heart Failure Rounding Note  PCP-Cardiologist: Kate Sable, MD   Subjective:    Breathing better today.  I/Os seem incomplete, she says she urinated a lot last night.  No weight yet.  Labs still pending.    Objective:   Weight Range: 104.1 kg Body mass index is 39.39 kg/m.   Vital Signs:   Temp:  [97.8 F (36.6 C)-98.2 F (36.8 C)] 98 F (36.7 C) (07/14 0727) Pulse Rate:  [29-90] 76 (07/14 0727) Resp:  [18-25] 23 (07/14 0727) BP: (86-105)/(53-78) 102/63 (07/14 0727) SpO2:  [87 %-98 %] 95 % (07/14 0813) FiO2 (%):  [32 %] 32 % (07/13 1019) Last BM Date: 03/24/19  Weight change: Filed Weights   03/24/19 0327 03/25/19 0300 03/26/19 0500  Weight: 101.2 kg 103.4 kg 104.1 kg    Intake/Output:   Intake/Output Summary (Last 24 hours) at 03/27/2019 0939 Last data filed at 03/27/2019 0925 Gross per 24 hour  Intake 960 ml  Output 2950 ml  Net -1990 ml      Physical Exam    General:  Well appearing. No resp difficulty HEENT: Normal Neck: Supple. JVP 8-9 cm. Carotids 2+ bilat; no bruits. No lymphadenopathy or thyromegaly appreciated. Cor: PMI nondisplaced. Regular rate & rhythm. No rubs, gallops. 2/6 SEM RUSB.  Lungs: Clear Abdomen: Soft, nontender, nondistended. No hepatosplenomegaly. No bruits or masses. Good bowel sounds. Extremities: No cyanosis, clubbing, rash, edema Neuro: Alert & orientedx3, cranial nerves grossly intact. moves all 4 extremities w/o difficulty. Affect pleasant   Telemetry   NSR 70s (personally reviewed).   Labs    CBC Recent Labs    03/25/19 0206  WBC 8.1  HGB 10.1*  HCT 34.0*  MCV 84.4  PLT 101   Basic Metabolic Panel Recent Labs    03/25/19 0206 03/26/19 0253  NA 138 137  K 3.8 3.8  CL 104 103  CO2 26 27  GLUCOSE 95 131*  BUN 11 12  CREATININE 0.91 0.90  CALCIUM 8.5* 8.8*   Liver Function Tests No results for input(s):  AST, ALT, ALKPHOS, BILITOT, PROT, ALBUMIN in the last 72 hours. No results for input(s): LIPASE, AMYLASE in the last 72 hours. Cardiac Enzymes No results for input(s): CKTOTAL, CKMB, CKMBINDEX, TROPONINI in the last 72 hours.  BNP: BNP (last 3 results) Recent Labs    11/06/18 0051 03/03/19 1914 03/24/19 0326  BNP 440.0* 528.0* 444.0*    ProBNP (last 3 results) No results for input(s): PROBNP in the last 8760 hours.   D-Dimer No results for input(s): DDIMER in the last 72 hours. Hemoglobin A1C No results for input(s): HGBA1C in the last 72 hours. Fasting Lipid Panel No results for input(s): CHOL, HDL, LDLCALC, TRIG, CHOLHDL, LDLDIRECT in the last 72 hours. Thyroid Function Tests No results for input(s): TSH, T4TOTAL, T3FREE, THYROIDAB in the last 72 hours.  Invalid input(s): FREET3  Other results:   Imaging     No results found.   Medications:     Scheduled Medications: . budesonide (PULMICORT) nebulizer solution  0.5 mg Nebulization BID  . busPIRone  7.5 mg Oral TID  . clonazePAM  0.5 mg Oral BID  . enoxaparin (LOVENOX) injection  40 mg Subcutaneous Q24H  . ferrous sulfate  325 mg Oral Q breakfast  . folic acid  751 mcg Oral Daily  . furosemide  60 mg Intravenous BID  . metoprolol succinate  25 mg Oral BID  . patient's guide to using coumadin book   Does not apply Once  . potassium chloride SA  40 mEq Oral Daily  . QUEtiapine  50 mg Oral QHS  . sertraline  100 mg Oral Daily  . sodium chloride flush  3 mL Intravenous Q12H  . traZODone  25 mg Oral QHS  . Warfarin - Physician Dosing Inpatient   Does not apply q1800     Infusions: . sodium chloride    . sodium chloride       PRN Medications:  sodium chloride, acetaminophen **OR** acetaminophen, albuterol, fluticasone, hydrOXYzine, sodium chloride flush   Assessment/Plan   1. Mitral valve disease:  Patient had mitral valve repair for MR in 7/14 in [place].  Underlying lesion uncertain, but I  suspect rheumatic heart disease versus possibly sarcoidosis itself.   I reviewed the recent TEE from 6/20. She has mild, eccentric MR.  The mitral valve has been repaired, mean gradient 8 mmHg with MVA 1.85 cm^2 by PHT.  Visually, the valve appears stenosed.  Overall, the stenosis appears moderate. I am not sure how much the mitral stenosis contributes to her dyspnea versus the sarcoidosis.  It may be that she is not symptomatically a lot better after MV replacement, but she certainly has at least moderate mitral stenosis based on imaging.  Surprisingly, her RHC in 6/20 was not particularly impressive, she had mild pulmonary venous hypertension (though PASP 88 mmHg by echo).  She has not had documented atrial fibrillation.  She says she diuresed well yesterday, suspect I/Os not accurate and no weight yet.  Mild volume overload still on exam.  - Would give Lasix 60 mg IV bid today, probably to po tomorrow.   - Increased Toprol XL to 25 mg bid to keep HR down in setting of mitral stenosis, rate 70s today.  - I am going to repeat RHC today to reassess her filling pressures and PA pressure. I discussed risks/benefits with patient and she agrees to procedure.   - She may need mitral valve replacement.  If so, should have mechanical valve.  Agree with trial of warfarin when she goes home to see if she can tolerate it and is compliant with INR followup. Warfarin has been started.  2. Acute on chronic diastolic CHF with prominent RV failure on imaging: TEE in 6/20 with EF 45-50%, mildly dilated/dysfunctional RV with D-shaped septum, moderate mitral stenosis.  The cardiac MRI showed LVEF 54%, RVEF 35%, and no myocardial or pericardial delayed enhancement (no evidence for cardiac sarcoidosis).  It is possible that the RV findings are from mitral stenosis.  However, she also could have pulmonary hypertension/RV failure from pulmonary sarcoidosis.  - As above, continue IV Lasix today and I will repeat RHC.   3. Pulmonary  hypertension: PASP 88 mmHg on TTE, but RHC in 6/20 showed only mild pulmonary venous hypertension.  Could have significant PAH in setting of sarcoidosis, would expect primarily pulmonary venous hypertension with mitral stenosis unless there has been pulmonary vascular remodeling.  - As above, RHC.   4. Sarcoidosis: Strongly suspected based on lung imaging, probable erythema nodosum, and lacrimal hyperplasia with dry eyes. No biopsy data.  - If she has cardiac surgery, would need biopsy of lymphadenopathy.  - Pulmicort begun.  See above discussion about holding off on systemic steroids until she is past the post-op period if she has mitral surgery.   Length of Stay: 3  Rasheena Talmadge, MD  03/27/2019, 9:39 AM    Advanced Heart Failure Team Pager [phone] (M-F; [place])  Please contact [facility] for night-coverage after hours (4p -7a ) and weekends on [email or web address]

## 2019-03-28 DIAGNOSIS — I342 Nonrheumatic mitral (valve) stenosis: Secondary | ICD-10-CM

## 2019-03-28 DIAGNOSIS — I509 Heart failure, unspecified: Secondary | ICD-10-CM

## 2019-03-28 DIAGNOSIS — I361 Nonrheumatic tricuspid (valve) insufficiency: Secondary | ICD-10-CM

## 2019-03-28 DIAGNOSIS — E876 Hypokalemia: Secondary | ICD-10-CM

## 2019-03-28 DIAGNOSIS — I340 Nonrheumatic mitral (valve) insufficiency: Secondary | ICD-10-CM

## 2019-03-28 DIAGNOSIS — D869 Sarcoidosis, unspecified: Secondary | ICD-10-CM

## 2019-03-28 LAB — COMPREHENSIVE METABOLIC PANEL
ALT: 14 U/L (ref 0–44)
AST: 16 U/L (ref 15–41)
Albumin: 3.1 g/dL — ABNORMAL LOW (ref 3.5–5.0)
Alkaline Phosphatase: 68 U/L (ref 38–126)
Anion gap: 10 (ref 5–15)
BUN: 17 mg/dL (ref 6–20)
CO2: 26 mmol/L (ref 22–32)
Calcium: 9.1 mg/dL (ref 8.9–10.3)
Chloride: 100 mmol/L (ref 98–111)
Creatinine, Ser: 0.93 mg/dL (ref 0.44–1.00)
GFR calc Af Amer: >60 mL/min (ref >60)
GFR calc non Af Amer: >60 mL/min (ref >60)
Glucose, Bld: 119 mg/dL — ABNORMAL HIGH (ref 70–99)
Potassium: 3.7 mmol/L (ref 3.5–5.1)
Sodium: 136 mmol/L (ref 135–145)
Total Bilirubin: 0.3 mg/dL (ref 0.3–1.2)
Total Protein: 7 g/dL (ref 6.5–8.1)

## 2019-03-28 LAB — CBC
HCT: 34.5 % — ABNORMAL LOW (ref 36.0–46.0)
Hemoglobin: 10.5 g/dL — ABNORMAL LOW (ref 12.0–15.0)
MCH: 25.3 pg — ABNORMAL LOW (ref 26.0–34.0)
MCHC: 30.4 g/dL (ref 30.0–36.0)
MCV: 83.1 fL (ref 80.0–100.0)
Platelets: 362 10*3/uL (ref 150–400)
RBC: 4.15 MIL/uL (ref 3.87–5.11)
RDW: 20 % — ABNORMAL HIGH (ref 11.5–15.5)
WBC: 6.9 10*3/uL (ref 4.0–10.5)
nRBC: 0 % (ref 0.0–0.2)

## 2019-03-28 LAB — MAGNESIUM: Magnesium: 2.1 mg/dL (ref 1.7–2.4)

## 2019-03-28 LAB — PROTIME-INR
INR: 1 (ref 0.8–1.2)
Prothrombin Time: 12.8 seconds (ref 11.4–15.2)

## 2019-03-28 MED ORDER — DOCUSATE SODIUM 100 MG PO CAPS
100.0000 mg | ORAL_CAPSULE | Freq: Two times a day (BID) | ORAL | Status: DC
  Administered 2019-03-28 – 2019-03-29 (×2): 100 mg via ORAL
  Filled 2019-03-28 (×2): qty 1

## 2019-03-28 MED ORDER — POTASSIUM CHLORIDE CRYS ER 20 MEQ PO TBCR
40.0000 meq | EXTENDED_RELEASE_TABLET | Freq: Once | ORAL | Status: AC
  Administered 2019-03-28: 40 meq via ORAL
  Filled 2019-03-28: qty 2

## 2019-03-28 MED ORDER — FUROSEMIDE 40 MG PO TABS
40.0000 mg | ORAL_TABLET | Freq: Two times a day (BID) | ORAL | Status: DC
  Administered 2019-03-28 – 2019-03-29 (×3): 40 mg via ORAL
  Filled 2019-03-28 (×3): qty 1

## 2019-03-28 NOTE — Progress Notes (Addendum)
Progress Note    Theresa Crawford  WUJ:[id number] DOB: Oct 05, 1980  DOA: 03/24/2019 PCP: Kerin Perna, NP    Brief Narrative:   Chief complaint: Follow-up heart failure.  Medical records reviewed and are as summarized below:  Theresa Crawford is an 38 y.o. female with a PMH of anxiety, depression, arthritis with chronic lower back pain, GERD, chronic normocytic anemia, sarcoidosis, tobacco abuse, headaches, obesity, hypertension, mitral valve stenosis status post mitral valve repair with history of pericarditis, pulmonary hypertension and tricuspid regurgitation status post admission 03/05/2019- 03/08/2019 for treatment of pulmonary edema secondary to mitral valve restenosis and acute diastolic heart failure who was readmitted 03/24/2019 with a chief complaint of the sudden onset of severe dyspnea associated with productive cough and pink frothy sputum.  In the ED she was tachypneic with a respiratory rate of 36 and tachycardic with a pulse of 110, and required BiPAP ventilation.  Chest x-ray showed pulmonary edema.  She was placed on high-dose Lasix and cardiology was subsequently consulted.  Assessment/Plan:   Principal Problem:   Acute on chronic diastolic congestive heart failure (HCC) in the setting of valvular heart disease and sarcoidosis versus rheumatic heart disease/acute respiratory failure with hypoxia/flash pulmonary edema Presented with acute on chronic diastolic CHF.  Initial chest x-ray images personally reviewed and show significant volume overload/pulmonary edema.  Evaluated by the heart failure team and has been managed with IV diuretics twice daily, Toprol-XL.  Underwent a right heart catheterization 03/27/2019 which showed fixed pulmonary hypertension and findings suggestive that her diastolic heart failure is primarily related to mitral stenosis (likely related to sarcoidosis versus rheumatic heart disease).  There was preserved cardiac output noted.  On 03/27/2019, her  Lasix dosing was escalated to achieve better volume reduction.  Her metoprolol was also increased.  Warfarin has been started.  No systemic steroids given possible need for cardiac surgery with mitral valve replacement.  Weight is down 2 kg.  I/O likely not accurate given limited oral intake recorded.  Plan today per cardiology is to transition to oral Lasix and to define timing of mitral valve surgery.  Active Problems:   Probable sarcoidosis Holding off on steroids.  Suspected based on lung imaging, probable erythema nodosum and lacrimal hyperplasia with dry eyes.  No biopsy data noted will need biopsy of lymphadenopathy if cardiac surgery is planned.  Continue Pulmicort.    Hypertension Blood pressure currently controlled.  Currently on Lasix 40 mg IV twice daily, metoprolol 25 mg twice daily.    Depression with anxiety Continue trazodone, Zoloft, Seroquel, Klonopin and BuSpar.    Recurrent mitral valve stenosis and regurgitation s/p mitral valve repair Awaiting CVTS evaluation regarding mechanical valve replacement.  Coumadin initiated in anticipation that this will be needed.    Hypokalemia Repleted.    Pulmonary hypertension ([place]) Secondary to valvular heart disease.  Status post cardiac catheterization 03/27/2019 which showed PA 47/22, mean 32, PCWP mean 17.    Tobacco abuse Counseled on cessation.    Normocytic anemia Continue iron supplements.  Hemoglobin 10.5.  Body mass index is 38.64 kg/m.   Family Communication/Anticipated D/C date and plan/Code Status   DVT prophylaxis: Coumadin ordered. Code Status: Full Code.  Family Communication: Declines my offer to call. Disposition Plan: Home when plan of care defined regarding mitral valve replacement.   Medical Consultants:    Cardiology  CVTS   Anti-Infectives:    None  Subjective:   Patient is very sleepy this morning and barely arouses to  answer my questions.  Overall, reports that her breathing has  improved but is not quite back to baseline.  Denies chest pressure/tightness.  Objective:    Vitals:   03/27/19 2107 03/27/19 2300 03/28/19 0500 03/28/19 0731  BP:  122/60 (!) 117/56 113/69  Pulse:  73 68   Resp:  (!) 21 19   Temp:  98 F (36.7 C) 97.8 F (36.6 C) 97.9 F (36.6 C)  TempSrc:  Oral Oral Oral  SpO2: 100% 100% 100% 100%  Weight:   102.1 kg   Height:        Intake/Output Summary (Last 24 hours) at 03/28/2019 0820 Last data filed at 03/28/2019 0731 Gross per 24 hour  Intake 484 ml  Output 4850 ml  Net -4366 ml   Filed Weights   03/25/19 0300 03/26/19 0500 03/28/19 0500  Weight: 103.4 kg 104.1 kg 102.1 kg    Exam: General: No acute distress.  Very sleepy. Cardiovascular: Heart sounds show a regular rate, and rhythm. No gallops or rubs. No murmurs. No JVD. Lungs: Diminished throughout with poor effort. No rales, rhonchi or wheezes. Abdomen: Soft, nontender, nondistended with normal active bowel sounds. No masses. No hepatosplenomegaly. Neurological: Lethargic.  Nonfocal.   Skin: Warm and dry. No rashes or lesions. Extremities: No clubbing or cyanosis.  Trace edema. Pedal pulses 2+. Psychiatric: Mood and affect are flat. Insight and judgment are impaired.   Data Reviewed:   I have personally reviewed following labs and imaging studies:  Labs: Labs show the following:   Basic Metabolic Panel: Recent Labs  Lab 03/24/19 0400 03/25/19 0206 03/26/19 0253 03/27/19 0427 03/27/19 1258 03/28/19 0252  NA 138 138 137 139 143   141 136  K 3.1* 3.8 3.8 3.8 4.2   4.3 3.7  CL 106 104 103 104  --  100  CO2 24 26 27 26   --  26  GLUCOSE 111* 95 131* 98  --  119*  BUN 10 11 12 14   --  17  CREATININE 0.84 0.91 0.90 1.11*  --  0.93  CALCIUM 8.1* 8.5* 8.8* 9.2  --  9.1  MG 1.9  --   --   --   --  2.1   GFR Estimated Creatinine Clearance: 94.5 mL/min (by C-G formula based on SCr of 0.93 mg/dL). Liver Function Tests: Recent Labs  Lab 03/24/19 0400  03/28/19 0252  AST 15 16  ALT 12 14  ALKPHOS 64 68  BILITOT 0.8 0.3  PROT 7.3 7.0  ALBUMIN 3.7 3.1*   Coagulation profile Recent Labs  Lab 03/27/19 0827 03/28/19 0252  INR 1.2 1.0    CBC: Recent Labs  Lab 03/24/19 0400 03/25/19 0206 03/27/19 1258 03/27/19 1927 03/28/19 0252  WBC 17.0* 8.1  --  7.1 6.9  NEUTROABS 14.1*  --   --   --   --   HGB 10.4* 10.1* 12.2   12.6 10.9* 10.5*  HCT 33.9* 34.0* 36.0   37.0 35.7* 34.5*  MCV 82.7 84.4  --  82.4 83.1  PLT 316 291  --  368 362   Sepsis Labs: Recent Labs  Lab 03/24/19 0400 03/25/19 0206 03/27/19 1927 03/28/19 0252  WBC 17.0* 8.1 7.1 6.9  LATICACIDVEN 1.4  --   --   --     Microbiology Recent Results (from the past 240 hour(s))  SARS Coronavirus 2 (CEPHEID - Performed in [facility] hospital lab), Hosp Order     Status: None   Collection Time: 03/24/19  3:30 AM   Specimen: Nasopharyngeal Swab  Result Value Ref Range Status   SARS Coronavirus 2 NEGATIVE NEGATIVE Final    Comment: (NOTE) If result is NEGATIVE SARS-CoV-2 target nucleic acids are NOT DETECTED. The SARS-CoV-2 RNA is generally detectable in upper and lower  respiratory specimens during the acute phase of infection. The lowest  concentration of SARS-CoV-2 viral copies this assay can detect is 250  copies / mL. A negative result does not preclude SARS-CoV-2 infection  and should not be used as the sole basis for treatment or other  patient management decisions.  A negative result may occur with  improper specimen collection / handling, submission of specimen other  than nasopharyngeal swab, presence of viral mutation(s) within the  areas targeted by this assay, and inadequate number of viral copies  (<250 copies / mL). A negative result must be combined with clinical  observations, patient history, and epidemiological information. If result is POSITIVE SARS-CoV-2 target nucleic acids are DETECTED. The SARS-CoV-2 RNA is generally detectable in upper  and lower  respiratory specimens dur ing the acute phase of infection.  Positive  results are indicative of active infection with SARS-CoV-2.  Clinical  correlation with patient history and other diagnostic information is  necessary to determine patient infection status.  Positive results do  not rule out bacterial infection or co-infection with other viruses. If result is PRESUMPTIVE POSTIVE SARS-CoV-2 nucleic acids MAY BE PRESENT.   A presumptive positive result was obtained on the submitted specimen  and confirmed on repeat testing.  While 2019 novel coronavirus  (SARS-CoV-2) nucleic acids may be present in the submitted sample  additional confirmatory testing may be necessary for epidemiological  and / or clinical management purposes  to differentiate between  SARS-CoV-2 and other Sarbecovirus currently known to infect humans.  If clinically indicated additional testing with an alternate test  methodology [phone]([id number]) is advised. The SARS-CoV-2 RNA is generally  detectable in upper and lower respiratory sp ecimens during the acute  phase of infection. The expected result is Negative. Fact Sheet for Patients:  BoilerBrush.com.cyhttps://www.fda.gov/media/136312/download Fact Sheet for Healthcare Providers: https://pope.com/https://www.fda.gov/media/136313/download This test is not yet approved or cleared by the [place] FDA and has been authorized for detection and/or diagnosis of SARS-CoV-2 by FDA under an Emergency Use Authorization (EUA).  This EUA will remain in effect (meaning this test can be used) for the duration of the COVID-19 declaration under Section 564(b)(1) of the Act, 21 U.S.C. section 360bbb-3(b)(1), unless the authorization is terminated or revoked sooner. Performed at [facility], [address] [place], [place] [address]   MRSA PCR Screening     Status: None   Collection Time: 03/24/19 10:57 AM   Specimen: Nasopharyngeal  Result Value Ref Range Status   MRSA by PCR NEGATIVE NEGATIVE Final     Comment:        The GeneXpert MRSA Assay (FDA approved for NASAL specimens only), is one component of a comprehensive MRSA colonization surveillance program. It is not intended to diagnose MRSA infection nor to guide or monitor treatment for MRSA infections. Performed at [facility], [address] [place], [place] [address]     Procedures and diagnostic studies:  No results found.  Medications:    budesonide (PULMICORT) nebulizer solution  0.5 mg Nebulization BID   busPIRone  7.5 mg Oral TID   clonazePAM  0.5 mg Oral BID   enoxaparin (LOVENOX) injection  40 mg Subcutaneous Q24H   ferrous sulfate  325 mg  Oral Q breakfast   folic acid  500 mcg Oral Daily   metoprolol succinate  25 mg Oral BID   patient's guide to using coumadin book   Does not apply Once   potassium chloride SA  40 mEq Oral Daily   QUEtiapine  50 mg Oral QHS   sertraline  100 mg Oral Daily   sodium chloride flush  3 mL Intravenous Q12H   traZODone  25 mg Oral QHS   Warfarin - Physician Dosing Inpatient   Does not apply q1800   Continuous Infusions:   LOS: 4 days    Time spent: 35 minutes with greater than 50% of the time spent in face-to-face interaction with the patient, coordination of care, and speaking with the patient's nurse.  Trula OreChristina Zein Helbing  [facility] Pager [phone].   *Please refer to [email or web address], password [email or web address] to get updated schedule on who will round on this patient, as hospitalists switch teams weekly. If 7PM-7AM, please contact night-coverage at [email or web address], password [email or web address] for any overnight needs.  03/28/2019, 8:20 AM

## 2019-03-28 NOTE — Progress Notes (Addendum)
[address]       [place] [address]             [phone]     CARDIOTHORACIC SURGERY PROGRESS NOTE  1 Day Post-Op  S/P Procedure(s) (LRB): RIGHT HEART CATH (N/A)  Subjective: Patient feels well and reports breathing at baseline.  Wants to go home.  Objective: Vital signs in last 24 hours: Temp:  [97.8 F (36.6 C)-98.3 F (36.8 C)] 97.9 F (36.6 C) (07/15 0731) Pulse Rate:  [68-82] 76 (07/15 0821) Cardiac Rhythm: Normal sinus rhythm (07/15 0702) Resp:  [0-67] 19 (07/15 0500) BP: (100-122)/(56-73) 113/69 (07/15 0731) SpO2:  [95 %-100 %] 100 % (07/15 0731) Weight:  [102.1 kg] 102.1 kg (07/15 0500)  Physical Exam:  Rhythm:   Sinus  Breath sounds: Clear  Heart sounds:  Regular rate and rhythm  Incisions:  n/a  Abdomen:  Soft  Extremities:  Warm, no edema   Intake/Output from previous day: 07/14 0701 - 07/15 0700 In: 248 [P.O.:240; I.V.:8] Out: 4550 [Urine:4550] Intake/Output this shift: Total I/O In: 239 [P.O.:236; I.V.:3] Out: 300 [Urine:300]  Lab Results: Recent Labs    03/27/19 1927 03/28/19 0252  WBC 7.1 6.9  HGB 10.9* 10.5*  HCT 35.7* 34.5*  PLT 368 362   BMET:  Recent Labs    03/27/19 0427 03/27/19 1258 03/28/19 0252  NA 139 143   141 136  K 3.8 4.2   4.3 3.7  CL 104  --  100  CO2 26  --  26  GLUCOSE 98  --  119*  BUN 14  --  17  CREATININE 1.11*  --  0.93  CALCIUM 9.2  --  9.1    CBG (last 3)  No results for input(s): GLUCAP in the last 72 hours. PT/INR:   Recent Labs    03/28/19 0252  LABPROT 12.8  INR 1.0    CXR:  N/A  Assessment/Plan: S/P Procedure(s) (LRB): RIGHT HEART CATH (N/A)  Results of right heart cath reviewed and discussed at length with Dr. Shirlee LatchMcLean and with patient.  Under the circumstances it seems prudent to proceed with redo mitral valve replacement and tricuspid valve repair sooner rather than later as the patient will remain at very high risk for recurrent acute exacerbations of heart  failure between now and the time of surgery.  Although it would be optimal to demonstrate compliance with warfarin anticoagulation for an extended period of time, under the circumstances this is probably not practical.  The patient seems to understand the implications of long-term warfarin anticoagulation and is agreeable with proceeding with surgery.  We tentatively plan to proceed with redo mitral valve replacement and tricuspid valve repair on Thursday, April 05, 2019.  Warfarin anticoagulation should be stopped and the patient may be discharged from the hospital tonight or tomorrow if she is felt to be clinically stable.  The patient will need to go for preoperative testing including repeat COVID test on Tuesday, April 03, 2019 at 1:25 PM followed by routine preoperative evaluation at short stay at 2 PM.  She will then need to return to my office for a follow-up appointment with her mother present at 4 PM.  She understands that her surgery will need to be canceled if her mother cannot be present for joint consultation to discuss the indications, risk, and potential benefits of surgery at length, to fully explain expectations for postoperative convalescence, and to discuss issues related to the need for long-term  warfarin anticoagulation.  Patient is agreeable and desires to be discharged from hospital tomorrow as long as she is medically stable enough to go home.  All questions answered.   I spent in excess of 15 minutes during the conduct of this hospital encounter and >50% of this time involved direct face-to-face encounter with the patient for counseling and/or coordination of their care.   Rexene Alberts, MD 03/28/2019 4:38 PM

## 2019-03-28 NOTE — Progress Notes (Signed)
Patient ID: Theresa Crawford, female   DOB: 1981/07/29, 38 y.o.   MRN: [id number]     Advanced Heart Failure Rounding Note  PCP-Cardiologist: Kate Sable, MD   Subjective:    Breathing has improved, I/Os very negative yesterday and weight down 4 lbs.   RHC Procedural Findings (7/14): Hemodynamics (mmHg) RA mean 8 RV 50/10 PA 47/22, mean 32 PCWP mean 17 Oxygen saturations: PA 69% AO 99% Cardiac Output (Fick) 6.69  Cardiac Index (Fick) 3.23 PVR 2.24 WU   Objective:   Weight Range: 102.1 kg Body mass index is 38.64 kg/m.   Vital Signs:   Temp:  [97.8 F (36.6 C)-98.3 F (36.8 C)] 97.9 F (36.6 C) (07/15 0731) Pulse Rate:  [68-82] 76 (07/15 0821) Resp:  [0-67] 19 (07/15 0500) BP: (100-122)/(56-73) 113/69 (07/15 0731) SpO2:  [95 %-100 %] 100 % (07/15 0731) Weight:  [102.1 kg] 102.1 kg (07/15 0500) Last BM Date: 03/24/19  Weight change: Filed Weights   03/25/19 0300 03/26/19 0500 03/28/19 0500  Weight: 103.4 kg 104.1 kg 102.1 kg    Intake/Output:   Intake/Output Summary (Last 24 hours) at 03/28/2019 0841 Last data filed at 03/28/2019 0731 Gross per 24 hour  Intake 484 ml  Output 4850 ml  Net -4366 ml      Physical Exam    General: NAD Neck: JVP 8-9 cm, no thyromegaly or thyroid nodule.  Lungs: Clear to auscultation bilaterally with normal respiratory effort. CV: Nondisplaced PMI.  Heart regular S1/S2, no S3/S4, 2/6 SEM RUSB.  No peripheral edema.  No carotid bruit.  Normal pedal pulses.  Abdomen: Soft, nontender, no hepatosplenomegaly, no distention.  Skin: Intact without lesions or rashes.  Neurologic: Alert and oriented x 3.  Psych: Normal affect. Extremities: No clubbing or cyanosis.  HEENT: Normal.    Telemetry   NSR 70s (personally reviewed).   Labs    CBC Recent Labs    03/27/19 1927 03/28/19 0252  WBC 7.1 6.9  HGB 10.9* 10.5*  HCT 35.7* 34.5*  MCV 82.4 83.1  PLT 368 433   Basic Metabolic Panel Recent Labs    03/27/19 0427  03/27/19 1258 03/28/19 0252  NA 139 143  141 136  K 3.8 4.2  4.3 3.7  CL 104  --  100  CO2 26  --  26  GLUCOSE 98  --  119*  BUN 14  --  17  CREATININE 1.11*  --  0.93  CALCIUM 9.2  --  9.1  MG  --   --  2.1   Liver Function Tests Recent Labs    03/28/19 0252  AST 16  ALT 14  ALKPHOS 68  BILITOT 0.3  PROT 7.0  ALBUMIN 3.1*   No results for input(s): LIPASE, AMYLASE in the last 72 hours. Cardiac Enzymes No results for input(s): CKTOTAL, CKMB, CKMBINDEX, TROPONINI in the last 72 hours.  BNP: BNP (last 3 results) Recent Labs    03/03/19 1914 03/24/19 0326 03/27/19 0427  BNP 528.0* 444.0* 163.2*    ProBNP (last 3 results) No results for input(s): PROBNP in the last 8760 hours.   D-Dimer No results for input(s): DDIMER in the last 72 hours. Hemoglobin A1C No results for input(s): HGBA1C in the last 72 hours. Fasting Lipid Panel No results for input(s): CHOL, HDL, LDLCALC, TRIG, CHOLHDL, LDLDIRECT in the last 72 hours. Thyroid Function Tests No results for input(s): TSH, T4TOTAL, T3FREE, THYROIDAB in the last 72 hours.  Invalid input(s): FREET3  Other results:  Imaging    No results found.   Medications:     Scheduled Medications: . budesonide (PULMICORT) nebulizer solution  0.5 mg Nebulization BID  . busPIRone  7.5 mg Oral TID  . clonazePAM  0.5 mg Oral BID  . enoxaparin (LOVENOX) injection  40 mg Subcutaneous Q24H  . ferrous sulfate  325 mg Oral Q breakfast  . folic acid  500 mcg Oral Daily  . furosemide  40 mg Oral BID  . metoprolol succinate  25 mg Oral BID  . patient's guide to using coumadin book   Does not apply Once  . potassium chloride SA  40 mEq Oral Daily  . QUEtiapine  50 mg Oral QHS  . sertraline  100 mg Oral Daily  . sodium chloride flush  3 mL Intravenous Q12H  . traZODone  25 mg Oral QHS  . Warfarin - Physician Dosing Inpatient   Does not apply q1800    Infusions:   PRN Medications: acetaminophen **OR**  acetaminophen, albuterol, fluticasone, hydrOXYzine   Assessment/Plan   1. Mitral valve disease:  Patient had mitral valve repair for [place] in 7[place]14 [place].  Underlying lesion uncertain, but I suspect rheumatic heart disease versus possibly sarcoidosis itself.   I reviewed the recent TEE from 6/20. She has mild, eccentric MR.  The mitral valve has been repaired, mean gradient 8 mmHg with MVA 1.85 cm^2 by PHT.  Visually, the valve appears significantly stenosed.  Overall, the stenosis appears moderate.  She has not had documented atrial fibrillation.  RHC on 7/14 showed pulmonary venous hypertension and mildly elevate PCWP, so she does not appear to have a significant component of PAH from sarcoidosis causing RV failure.      - Increased Toprol XL to 25 mg bid to keep HR down in setting of mitral stenosis, rate 70s today.  - Discussed with Dr. Cornelius Moraswen, appears to me that mitral stenosis may be the major cause of her CHF (rather than PAH from sarcoidosis with RV failure.  He is going to talk with her today and decide on timing for surgery. She will need a mechanical valve.  If surgery will be this admission, can stop warfarin (started to see if she would be compliant with it at home, I suspect that she will be).   2. Acute on chronic diastolic CHF with prominent RV failure on imaging: TEE in 6/20 with EF 45-50%, mildly dilated/dysfunctional RV with D-shaped septum, moderate mitral stenosis.  The cardiac MRI showed LVEF 54%, RVEF 35%, and no myocardial or pericardial delayed enhancement (no evidence for cardiac sarcoidosis).  RHC yesterday showed mild pulmonary venous hypertension and mildly elevated PCWP, no evidence for PAH from sarcoidosis, suspect CHF may be from mitral stenosis primarily.  Good diuresis yesterday, weight down 4 lbs.  - Transition to po Lasix 40 bid today. Replace K.  3. Pulmonary hypertension: PASP 88 mmHg on TTE, but RHC in 6/20 showed only mild pulmonary venous hypertension.  Could  have significant PAH in setting of sarcoidosis, would expect primarily pulmonary venous hypertension with mitral stenosis unless there has been pulmonary vascular remodeling. RHC on 7/14 again showed pulmonary venous hypertension, suggestive of mitral stenosis as cause of CHF rather than PAH from sarcoidosis with RV failure.   4. Sarcoidosis: Strongly suspected based on lung imaging, probable erythema nodosum, and lacrimal hyperplasia with dry eyes. No biopsy data.  - If she has cardiac surgery, would need biopsy of lymphadenopathy.  - Pulmicort begun.  See above discussion about holding  off on systemic steroids until she is past the post-op period if she has mitral surgery.   Length of Stay: 4  Marca Anconaalton Sylvan Sookdeo, MD  03/28/2019, 8:41 AM  Advanced Heart Failure Team Pager [phone] (M-F; 7a - 4p)  Please contact [facility] for night-coverage after hours (4p -7a ) and weekends on [email or web address]

## 2019-03-28 NOTE — Progress Notes (Signed)
RT came to patient room to give AM pulmicort.   Patient asked if RT could return a little later due to wanting to rest some more.  Will let unit RT know.  Sats currently 99% and no respiratory distress noted.

## 2019-03-28 NOTE — Progress Notes (Signed)
Patient understood that surgery will be on next Thursday and explained no coumadin today. There is order for US doppler before d/c home tomorrow, it has to be done. Explained patient to do frequently resting while she is doing some activities. She walked the hallway, need to rest once then went back to the room. HS [employer]

## 2019-03-29 ENCOUNTER — Inpatient Hospital Stay (HOSPITAL_COMMUNITY): Payer: Self-pay

## 2019-03-29 ENCOUNTER — Other Ambulatory Visit: Payer: Self-pay | Admitting: *Deleted

## 2019-03-29 DIAGNOSIS — Z01810 Encounter for preprocedural cardiovascular examination: Secondary | ICD-10-CM

## 2019-03-29 DIAGNOSIS — E669 Obesity, unspecified: Secondary | ICD-10-CM | POA: Diagnosis present

## 2019-03-29 DIAGNOSIS — I050 Rheumatic mitral stenosis: Secondary | ICD-10-CM

## 2019-03-29 DIAGNOSIS — J9801 Acute bronchospasm: Secondary | ICD-10-CM

## 2019-03-29 LAB — BASIC METABOLIC PANEL
Anion gap: 8 (ref 5–15)
BUN: 14 mg/dL (ref 6–20)
CO2: 25 mmol/L (ref 22–32)
Calcium: 9.2 mg/dL (ref 8.9–10.3)
Chloride: 104 mmol/L (ref 98–111)
Creatinine, Ser: 0.9 mg/dL (ref 0.44–1.00)
GFR calc Af Amer: >60 mL/min (ref >60)
GFR calc non Af Amer: >60 mL/min (ref >60)
Glucose, Bld: 132 mg/dL — ABNORMAL HIGH (ref 70–99)
Potassium: 4.1 mmol/L (ref 3.5–5.1)
Sodium: 137 mmol/L (ref 135–145)

## 2019-03-29 MED ORDER — METOPROLOL SUCCINATE ER 25 MG PO TB24: 25.0000 mg | ORAL_TABLET | Freq: Two times a day (BID) | ORAL | 3 refills | Status: DC

## 2019-03-29 MED ORDER — TORSEMIDE 20 MG PO TABS
40.0000 mg | ORAL_TABLET | Freq: Every day | ORAL | Status: DC
  Administered 2019-03-29: 40 mg via ORAL
  Filled 2019-03-29: qty 2

## 2019-03-29 MED ORDER — TRAZODONE HCL 50 MG PO TABS: 25.0000 mg | ORAL_TABLET | Freq: Every day | ORAL | 3 refills | Status: DC

## 2019-03-29 MED ORDER — TORSEMIDE 20 MG PO TABS: 40.0000 mg | ORAL_TABLET | Freq: Every day | ORAL | 3 refills | Status: DC

## 2019-03-29 MED ORDER — BUDESONIDE 180 MCG/ACT IN AEPB: 1.0000 | INHALATION_SPRAY | Freq: Two times a day (BID) | RESPIRATORY_TRACT | 3 refills | Status: DC

## 2019-03-29 NOTE — TOC Transition Note (Signed)
Transition of Care [facility]) - CM/SW Discharge Note   Patient Details  Name: Theresa Crawford MRN: [id number] Date of Birth: 09/11/1981  [facility] CM/SW Contact:  Candie Chroman, LCSW Phone Number: 03/29/2019, 10:28 AM   Clinical Narrative: PCP appt scheduled for Wednesday 6/22 at 10:10 am. Added to AVS. They will call her ahead of time to let her know if it is over the phone or in person. Patient has orders to discharge home today. No further concerns. CSW signing off.    Final next level of care: Home/Self Care Barriers to Discharge: Barriers Resolved   Patient Goals and CMS Choice Patient states their goals for this hospitalization and ongoing recovery are:: Pt will return home with CHF nurse for disease management CMS Medicare.gov Compare Post Acute Care list provided to:: Patient Choice offered to / list presented to : Patient  Discharge Placement                    Patient and family notified of of transfer: 03/29/19  Discharge Plan and Services In-house Referral: Clinical Social Work Discharge Planning Services: NA Post Acute Care Choice: Home Health          DME Arranged: N/A DME Agency: NA       HH Arranged: NA HH Agency: NA        Social Determinants of Health (SDOH) Interventions     Readmission Risk Interventions Readmission Risk Prevention Plan 03/28/2019 03/27/2019  PCP or Specialist Appt within 3-5 Days Complete -  HRI or Home Care Consult Complete -  Social Work Consult for [place] Planning/Counseling Complete -  Palliative Care Screening Not Applicable -  [place] or [place] - Complete  Palliative Care Screening - Not [place] - Not Applicable  Some recent data might be hidden

## 2019-03-29 NOTE — Progress Notes (Signed)
Patient ID: Theresa Crawford, female   DOB: July 09, 1981, 38 y.o.   MRN: [id number]     Advanced Heart Failure Rounding Note  PCP-Cardiologist: Kate Sable, MD   Subjective:    Breathing better overall.  Plan for surgery on 7/23.    RHC Procedural Findings (7/14): Hemodynamics (mmHg) RA mean 8 RV 50/10 PA 47/22, mean 32 PCWP mean 17 Oxygen saturations: PA 69% AO 99% Cardiac Output (Fick) 6.69  Cardiac Index (Fick) 3.23 PVR 2.24 WU   Objective:   Weight Range: 103.2 kg Body mass index is 39.05 kg/m.   Vital Signs:   Temp:  [97.6 F (36.4 C)-98.2 F (36.8 C)] 98.2 F (36.8 C) (07/16 0729) Pulse Rate:  [75-81] 80 (07/16 0322) Resp:  [16-23] 22 (07/16 0729) BP: (89-127)/(47-86) 89/47 (07/16 0729) SpO2:  [96 %-100 %] 96 % (07/16 0739) Weight:  [103.2 kg] 103.2 kg (07/16 0322) Last BM Date: 03/28/19  Weight change: Filed Weights   03/26/19 0500 03/28/19 0500 03/29/19 0322  Weight: 104.1 kg 102.1 kg 103.2 kg    Intake/Output:   Intake/Output Summary (Last 24 hours) at 03/29/2019 0904 Last data filed at 03/29/2019 0600 Gross per 24 hour  Intake 687 ml  Output 300 ml  Net 387 ml      Physical Exam    General: NAD Neck: JVP 8-9 cm, no thyromegaly or thyroid nodule.  Lungs: Clear to auscultation bilaterally with normal respiratory effort. CV: Nondisplaced PMI.  Heart regular S1/S2, no S3/S4, 1/6 SEM RUSB.  No peripheral edema.   Abdomen: Soft, nontender, no hepatosplenomegaly, no distention.  Skin: Intact without lesions or rashes.  Neurologic: Alert and oriented x 3.  Psych: Normal affect. Extremities: No clubbing or cyanosis.  HEENT: Normal.    Telemetry   NSR 70s (personally reviewed).   Labs    CBC Recent Labs    03/27/19 1927 03/28/19 0252  WBC 7.1 6.9  HGB 10.9* 10.5*  HCT 35.7* 34.5*  MCV 82.4 83.1  PLT 368 790   Basic Metabolic Panel Recent Labs    03/28/19 0252 03/29/19 0248  NA 136 137  K 3.7 4.1  CL 100 104  CO2 26 25   GLUCOSE 119* 132*  BUN 17 14  CREATININE 0.93 0.90  CALCIUM 9.1 9.2  MG 2.1  --    Liver Function Tests Recent Labs    03/28/19 0252  AST 16  ALT 14  ALKPHOS 68  BILITOT 0.3  PROT 7.0  ALBUMIN 3.1*   No results for input(s): LIPASE, AMYLASE in the last 72 hours. Cardiac Enzymes No results for input(s): CKTOTAL, CKMB, CKMBINDEX, TROPONINI in the last 72 hours.  BNP: BNP (last 3 results) Recent Labs    03/03/19 1914 03/24/19 0326 03/27/19 0427  BNP 528.0* 444.0* 163.2*    ProBNP (last 3 results) No results for input(s): PROBNP in the last 8760 hours.   D-Dimer No results for input(s): DDIMER in the last 72 hours. Hemoglobin A1C No results for input(s): HGBA1C in the last 72 hours. Fasting Lipid Panel No results for input(s): CHOL, HDL, LDLCALC, TRIG, CHOLHDL, LDLDIRECT in the last 72 hours. Thyroid Function Tests No results for input(s): TSH, T4TOTAL, T3FREE, THYROIDAB in the last 72 hours.  Invalid input(s): FREET3  Other results:   Imaging    No results found.   Medications:     Scheduled Medications: . budesonide (PULMICORT) nebulizer solution  0.5 mg Nebulization BID  . busPIRone  7.5 mg Oral TID  . clonazePAM  0.5 mg Oral BID  . docusate sodium  100 mg Oral BID  . ferrous sulfate  325 mg Oral Q breakfast  . folic acid  500 mcg Oral Daily  . metoprolol succinate  25 mg Oral BID  . patient's guide to using coumadin book   Does not apply Once  . potassium chloride SA  40 mEq Oral Daily  . QUEtiapine  50 mg Oral QHS  . sertraline  100 mg Oral Daily  . sodium chloride flush  3 mL Intravenous Q12H  . torsemide  40 mg Oral Daily  . traZODone  25 mg Oral QHS    Infusions:   PRN Medications: acetaminophen **OR** acetaminophen, albuterol, fluticasone, hydrOXYzine   Assessment/Plan   1. Mitral valve disease:  [place]  Underlying lesion uncertain, but I suspect rheumatic heart disease  versus possibly sarcoidosis itself.   I reviewed the recent TEE from 6/20. She has mild, eccentric MR.  The mitral valve has been repaired, mean gradient 8 mmHg with MVA 1.85 cm^2 by PHT.  Visually, the valve appears significantly stenosed.  Overall, the stenosis appears moderate.  She has not had documented atrial fibrillation.  RHC on 7/14 showed pulmonary venous hypertension and mildly elevate PCWP, so she does not appear to have a significant component of PAH from sarcoidosis causing RV failure.      - Increased Toprol XL to 25 mg bid to keep HR down in setting of mitral stenosis, rate 70s today.  - Discussed with Dr. Cornelius Moraswen, appears to me that mitral stenosis may be the major cause of her CHF (rather than PAH from sarcoidosis with RV failure).  She will need a mechanical mitral valve. Plan for surgery on 7/23, she can go home prior to this.  - As she will have surgery next week, have stopped warfarin.   2. Acute on chronic diastolic CHF with prominent RV failure on imaging: TEE in 6/20 with EF 45-50%, mildly dilated/dysfunctional RV with D-shaped septum, moderate mitral stenosis.  The cardiac MRI showed LVEF 54%, RVEF 35%, and no myocardial or pericardial delayed enhancement (no evidence for cardiac sarcoidosis).  RHC yesterday showed mild pulmonary venous hypertension and mildly elevated PCWP, no evidence for PAH from sarcoidosis, suspect CHF may be from mitral stenosis primarily.  She still has mild volume overload but is comfortable.  - Transition to torsemide 40 mg daily with KCl 40 daily (think this will be better for her than Lasix).  3. Pulmonary hypertension: PASP 88 mmHg on TTE, but RHC in 6/20 showed only mild pulmonary venous hypertension.  Could have significant PAH in setting of sarcoidosis, would expect primarily pulmonary venous hypertension with mitral stenosis unless there has been pulmonary vascular remodeling. RHC on 7/14 again showed pulmonary venous hypertension, suggestive of mitral  stenosis as cause of CHF rather than PAH from sarcoidosis with RV failure.   4. Sarcoidosis: Strongly suspected based on lung imaging, probable erythema nodosum, and lacrimal hyperplasia with dry eyes. No biopsy data.  - If she has cardiac surgery, would need biopsy of lymphadenopathy.  - Pulmicort begun.  See above discussion about holding off on systemic steroids until she is past the post-op period if she has mitral surgery.   I think that she is ready for home today.  She has followup with Dr. Cornelius Moraswen next week and then tentatively will have surgery 7/23.  We will follow her in CHF clinic after surgery.  Cardiac meds for  home: Torsemide 40 mg daily, KCl 40 daily, Toprol XL 25 bid.   Length of Stay: 5  Marca Anconaalton McLean, MD  03/29/2019, 9:04 AM  Advanced Heart Failure Team Pager [phone] (M-F; 7a - 4p)  Please contact [facility] for night-coverage after hours (4p -7a ) and weekends on [email or web address]

## 2019-03-29 NOTE — Discharge Summary (Signed)
Physician Discharge Summary  Theresa Crawford DOB: [birth year] DOA: 03/24/2019  PCP: Grayce SessionsEdwards, Michelle P, NP  Admit date: 03/24/2019 Discharge date: 03/29/2019  Admitted From: Home Discharge disposition: Home   Recommendations for Outpatient Follow-Up:   1. F/U scheduled with Dr. Cornelius Moraswen for MVR. 2. Will be on coumadin post-operatively.   Discharge Diagnosis:   Principal Problem:   Acute on chronic diastolic congestive heart failure (HCC) Active Problems:   Acute respiratory failure with hypoxia (HCC)   Hypertension   Flash pulmonary edema (HCC)   Depression with anxiety   Recurrent mitral valve stenosis and regurgitation s/p mitral valve repair   Hypokalemia   Pulmonary hypertension (HCC)   Tobacco abuse   Normocytic anemia   Probable sarcoidosis   Obesity (BMI 30-39.9)    Discharge Condition: Improved.   Diet recommendation: Low sodium, heart healthy.    Code status: Full.   History of Present Illness:   Theresa Crawford is an 38 y.o. female with a PMH of anxiety, depression, arthritis with chronic lower back pain, GERD, chronic normocytic anemia, sarcoidosis, tobacco abuse, headaches, obesity, hypertension, mitral valve stenosis status post mitral valve repair with history of pericarditis, pulmonary hypertension and tricuspid regurgitation status post admission 03/05/2019- 03/08/2019 for treatment of pulmonary edema secondary to mitral valve restenosis and acute diastolic heart failure who was readmitted 03/24/2019 with a chief complaint of the sudden onset of severe dyspnea associated with productive cough and pink frothy sputum.  In the ED she was tachypneic with a respiratory rate of 36 and tachycardic with a pulse of 110, and required BiPAP ventilation.  Chest x-ray showed pulmonary edema.  She was placed on high-dose Lasix and cardiology was subsequently consulted.   Hospital Course by Problem:   Principal Problem:   Acute on chronic diastolic congestive  heart failure (HCC) in the setting of valvular heart disease and sarcoidosis versus rheumatic heart disease/acute respiratory failure with hypoxia/flash pulmonary edema Presented with acute on chronic diastolic CHF.  Initial chest x-ray showed volume overload/pulmonary edema.  Evaluated by the heart failure team and has been managed with IV diuretics twice daily, Toprol-XL.  Underwent a right heart catheterization 03/27/2019 which showed fixed pulmonary hypertension and findings suggestive that her diastolic heart failure is primarily related to mitral stenosis (likely related to sarcoidosis versus rheumatic heart disease).  There was preserved cardiac output noted.  On 03/27/2019, her Lasix dosing was escalated to achieve better volume reduction.  Her metoprolol was also increased.  Warfarin was initially started, but subsequently discontinued in anticipation of valve replacement next week.    She has now been transitioned to oral Bumex and her Toprol dose has been increased.  She will follow-up with Dr. Burnett Shenghman next week for mitral valve replacement.  Active Problems:   Probable sarcoidosis Holding off on steroids given surgery scheduled for next week.  Suspected based on lung imaging, probable erythema nodosum and lacrimal hyperplasia with dry eyes.  No biopsy data noted will need biopsy of lymphadenopathy if cardiac surgery is planned.  Continue Pulmicort at discharge in inhaler form.    Hypertension Blood pressure currently controlled.    Discharged on Bumex 40 mg daily and Toprol 25 mg twice daily.    Depression with anxiety Continue trazodone, Zoloft, Seroquel, Klonopin and BuSpar.    Recurrent mitral valve stenosis and regurgitation s/p mitral valve repair Status post CVTS evaluation regarding mechanical valve replacement.    Will return to the hospital next week for mechanical valve.  Hypokalemia Repleted.  Placed on potassium supplementation at discharge.    Pulmonary hypertension  (HCC) Secondary to valvular heart disease.  Status post cardiac catheterization 03/27/2019 which showed PA 47/22, mean 32, PCWP mean 17.    Tobacco abuse Counseled on cessation.    Normocytic anemia Continue iron supplements.  Hemoglobin 10.5.    Obesity Body mass index is 38.64 kg/m.    Medical Consultants:    Cardiology  CVTS   Discharge Exam:   Vitals:   03/29/19 0729 03/29/19 0739  BP: (!) 89/47   Pulse:    Resp: (!) 22   Temp: 98.2 F (36.8 C)   SpO2:  96%   Vitals:   03/28/19 2345 03/29/19 0322 03/29/19 0729 03/29/19 0739  BP: (!) 101/59 99/71 (!) 89/47   Pulse: 76 80    Resp: (!) 23 20 (!) 22   Temp: 98.1 F (36.7 C) 97.8 F (36.6 C) 98.2 F (36.8 C)   TempSrc: Oral Oral Oral   SpO2: 98% 99%  96%  Weight:  103.2 kg    Height:        General exam: Appears calm and comfortable.  Respiratory system: Clear to auscultation. Respiratory effort normal. Cardiovascular system: S1 & S2 heard, RRR. No JVD,  rubs, gallops or clicks.  1/6 systolic ejection murmur appreciated. Gastrointestinal system: Abdomen is nondistended, soft and nontender. No organomegaly or masses felt. Normal bowel sounds heard. Central nervous system: Alert and oriented. No focal neurological deficits. Extremities: No clubbing,  or cyanosis. No edema. Skin: No rashes, lesions or ulcers. Psychiatry: Judgement and insight appear normal. Mood & affect appropriate.    The results of significant diagnostics from this hospitalization (including imaging, microbiology, ancillary and laboratory) are listed below for reference.     Procedures and Diagnostic Studies:   Dg Chest Port 1 View  Result Date: 03/24/2019 CLINICAL DATA:  Shortness of breath EXAM: PORTABLE CHEST 1 VIEW COMPARISON:  03/03/2019, 11/06/2018 FINDINGS: Post sternotomy changes and valve prosthesis. Cardiomegaly with vascular congestion. Interim worsening of diffuse bilateral interstitial and ground-glass opacity. No  pleural effusion. No pneumothorax. IMPRESSION: Cardiomegaly with vascular congestion and interval worsening of diffuse bilateral interstitial and ground-glass opacity, suspicious for pulmonary edema. Electronically Signed   By: Jasmine PangKim  Fujinaga M.D.   On: 03/24/2019 03:58     Labs:   Basic Metabolic Panel: Recent Labs  Lab 03/24/19 0400 03/25/19 0206 03/26/19 0253 03/27/19 0427 03/27/19 1258 03/28/19 0252 03/29/19 0248  NA 138 138 137 139 143  141 136 137  K 3.1* 3.8 3.8 3.8 4.2  4.3 3.7 4.1  CL 106 104 103 104  --  100 104  CO2 24 26 27 26   --  26 25  GLUCOSE 111* 95 131* 98  --  119* 132*  BUN 10 11 12 14   --  17 14  CREATININE 0.84 0.91 0.90 1.11*  --  0.93 0.90  CALCIUM 8.1* 8.5* 8.8* 9.2  --  9.1 9.2  MG 1.9  --   --   --   --  2.1  --    GFR Estimated Creatinine Clearance: 98.2 mL/min (by C-G formula based on SCr of 0.9 mg/dL). Liver Function Tests: Recent Labs  Lab 03/24/19 0400 03/28/19 0252  AST 15 16  ALT 12 14  ALKPHOS 64 68  BILITOT 0.8 0.3  PROT 7.3 7.0  ALBUMIN 3.7 3.1*   Coagulation profile Recent Labs  Lab 03/27/19 0827 03/28/19 0252  INR 1.2 1.0    CBC:  Recent Labs  Lab 03/24/19 0400 03/25/19 0206 03/27/19 1258 03/27/19 1927 03/28/19 0252  WBC 17.0* 8.1  --  7.1 6.9  NEUTROABS 14.1*  --   --   --   --   HGB 10.4* 10.1* 12.2  12.6 10.9* 10.5*  HCT 33.9* 34.0* 36.0  37.0 35.7* 34.5*  MCV 82.7 84.4  --  82.4 83.1  PLT 316 291  --  368 362    Microbiology Recent Results (from the past 240 hour(s))  SARS Coronavirus 2 (CEPHEID - Performed in [facility] hospital lab), Hosp Order     Status: None   Collection Time: 03/24/19  3:30 AM   Specimen: Nasopharyngeal Swab  Result Value Ref Range Status   SARS Coronavirus 2 NEGATIVE NEGATIVE Final    Comment: (NOTE) If result is NEGATIVE SARS-CoV-2 target nucleic acids are NOT DETECTED. The SARS-CoV-2 RNA is generally detectable in upper and lower  respiratory specimens during the acute  phase of infection. The lowest  concentration of SARS-CoV-2 viral copies this assay can detect is 250  copies / mL. A negative result does not preclude SARS-CoV-2 infection  and should not be used as the sole basis for treatment or other  patient management decisions.  A negative result may occur with  improper specimen collection / handling, submission of specimen other  than nasopharyngeal swab, presence of viral mutation(s) within the  areas targeted by this assay, and inadequate number of viral copies  (<250 copies / mL). A negative result must be combined with clinical  observations, patient history, and epidemiological information. If result is POSITIVE SARS-CoV-2 target nucleic acids are DETECTED. The SARS-CoV-2 RNA is generally detectable in upper and lower  respiratory specimens dur ing the acute phase of infection.  Positive  results are indicative of active infection with SARS-CoV-2.  Clinical  correlation with patient history and other diagnostic information is  necessary to determine patient infection status.  Positive results do  not rule out bacterial infection or co-infection with other viruses. If result is PRESUMPTIVE POSTIVE SARS-CoV-2 nucleic acids MAY BE PRESENT.   A presumptive positive result was obtained on the submitted specimen  and confirmed on repeat testing.  While 2019 novel coronavirus  (SARS-CoV-2) nucleic acids may be present in the submitted sample  additional confirmatory testing may be necessary for epidemiological  and / or clinical management purposes  to differentiate between  SARS-CoV-2 and other Sarbecovirus currently known to infect humans.  If clinically indicated additional testing with an alternate test  methodology [phone]) is advised. The SARS-CoV-2 RNA is generally  detectable in upper and lower respiratory sp ecimens during the acute  phase of infection. The expected result is Negative. Fact Sheet for Patients:   BoilerBrush.com.cy Fact Sheet for Healthcare Providers: https://pope.com/ This test is not yet approved or cleared by the [place] FDA and has been authorized for detection and/or diagnosis of SARS-CoV-2 by FDA under an Emergency Use Authorization (EUA).  This EUA will remain in effect (meaning this test can be used) for the duration of the COVID-19 declaration under Section 564(b)(1) of the Act, 21 U.S.C. section 360bbb-3(b)(1), unless the authorization is terminated or revoked sooner. Performed at [facility], [address], [place], [place] [address]   MRSA PCR Screening     Status: None   Collection Time: 03/24/19 10:57 AM   Specimen: Nasopharyngeal  Result Value Ref Range Status   MRSA by PCR NEGATIVE NEGATIVE Final    Comment:  The GeneXpert MRSA Assay (FDA approved for NASAL specimens only), is one component of a comprehensive MRSA colonization surveillance program. It is not intended to diagnose MRSA infection nor to guide or monitor treatment for MRSA infections. Performed at [facility], [place] [address], [place], [place] [address]      Discharge Instructions:   Discharge Instructions    (HEART FAILURE PATIENTS) Call MD:  Anytime you have any of the following symptoms: 1) 3 pound weight gain in 24 hours or 5 pounds in 1 week 2) shortness of breath, with or without a dry hacking cough 3) swelling in the hands, feet or stomach 4) if you have to sleep on extra pillows at night in order to breathe.   Complete by: As directed    Call MD for:  difficulty breathing, headache or visual disturbances   Complete by: As directed    Call MD for:  extreme fatigue   Complete by: As directed    Call MD for:  persistant dizziness or light-headedness   Complete by: As directed    Diet - low sodium heart healthy   Complete by: As directed    Increase activity slowly   Complete by: As directed      Allergies as of  03/29/2019      Reactions   Bee Venom Anaphylaxis   Lisinopril Anaphylaxis, Shortness Of Breath, Swelling, Other (See Comments)   Throat swells   Penicillins Shortness Of Breath, Swelling, Other (See Comments)   Has patient had a PCN reaction causing immediate rash, facial/tongue/throat swelling, SOB or lightheadedness with hypotension: Yes Has patient had a PCN reaction causing severe rash involving mucus membranes or skin necrosis: Yes Has patient had a PCN reaction that required hospitalization No Has patient had a PCN reaction occurring within the last 10 years: No If all of the above answers are "NO", then may proceed with Cephalosporin use.   Perflutren Lipid Microsphere Shortness Of Breath, Other (See Comments)   Chest Pain/Tightness, also   Shellfish Allergy Anaphylaxis   Contrast Media [iodinated Diagnostic Agents] Hives, Itching, Other (See Comments)   Itching and hives to throat, neck, face and arms.   No difficulty breathing.   This was given at [facility] approximately 2015. Contrast Dye      Medication List    STOP taking these medications   furosemide 40 MG tablet Commonly known as: LASIX     TAKE these medications   albuterol 108 (90 Base) MCG/ACT inhaler Commonly known as: VENTOLIN HFA Inhale 2 puffs into the lungs every 6 (six) hours as needed for wheezing or shortness of breath.   budesonide 180 MCG/ACT inhaler Commonly known as: PULMICORT Inhale 1 puff into the lungs 2 (two) times a day.   busPIRone 7.5 MG tablet Commonly known as: BUSPAR Take 1 tablet (7.5 mg total) by mouth 3 (three) times daily.   clonazePAM 0.5 MG tablet Commonly known as: KLONOPIN Take 1 tablet (0.5 mg total) by mouth 2 (two) times daily.   docusate sodium 100 MG capsule Commonly known as: COLACE Take 100 mg by mouth daily as needed for moderate constipation.   EpiPen 2-Pak 0.3 mg/0.3 mL Soaj injection Generic drug: EPINEPHrine Inject 0.3 Units into the muscle  once.   ferrous sulfate 325 (65 FE) MG tablet Take 1 tablet (325 mg total) by mouth 2 (two) times daily with a meal. What changed: when to take this   folic acid 498 MCG tablet Commonly known as: [employer]  Take 400 mcg by mouth daily.   hydrOXYzine 25 MG capsule Commonly known as: Vistaril Take 1 capsule (25 mg total) by mouth 3 (three) times daily as needed.   metoprolol succinate 25 MG 24 hr tablet Commonly known as: TOPROL-XL Take 1 tablet (25 mg total) by mouth 2 (two) times a day. What changed:   how much to take  when to take this   potassium chloride SA 20 MEQ tablet Commonly known as: K-DUR Take 2 tablets (40 mEq total) by mouth daily. Take 2 tablets daily   PROBIOTIC PO Take 1 capsule by mouth daily.   QUEtiapine 50 MG tablet Commonly known as: SEROQUEL Take 1 tablet (50 mg total) by mouth at bedtime.   sertraline 100 MG tablet Commonly known as: ZOLOFT Take 1 tablet (100 mg total) by mouth daily.   torsemide 20 MG tablet Commonly known as: DEMADEX Take 2 tablets (40 mg total) by mouth daily.   traZODone 50 MG tablet Commonly known as: DESYREL Take 0.5 tablets (25 mg total) by mouth at bedtime.   vitamin B-12 1000 MCG tablet Commonly known as: CYANOCOBALAMIN Take 1,000 mcg by mouth daily.      Follow-up Information    Purcell Nailswen, Clarence H, MD Follow up in 1 week(s).   Specialty: Cardiothoracic Surgery Why: At your scheduled appointment. Contact information: [address] [place] [address] [phone]            Time coordinating discharge: 40 minutes.  SignedTrula Ore:  Abhi Moccia  Pager [phone] [facility] 03/29/2019, 9:46 AM

## 2019-03-29 NOTE — Discharge Instructions (Signed)
Bronchospasm, Adult ° °Bronchospasm is when airways in the lungs get smaller. When this happens, it can be hard to breathe. You may cough. You may also make a whistling sound when you breathe (wheeze). °Follow these instructions at home: °Medicines °· Take over-the-counter and prescription medicines only as told by your doctor. °· If you need to use an inhaler or nebulizer to take your medicine, ask your doctor how to use it. °· If you were given a spacer, always use it with your inhaler. °Lifestyle °· Change your heating and air conditioning filter. Do this at least once a month. °· Try not to use fireplaces and wood stoves. °· Do not  smoke. Do not  allow smoking in your home. °· Try not to use things that have a strong smell, like perfume. °· Get rid of pests (such as roaches and mice) and their poop. °· Remove any mold from your home. °· Keep your house clean. Get rid of dust. °· Use cleaning products that have no smell.  °· Replace carpet with wood, tile, or vinyl flooring. °· Use allergy-proof pillows, mattress covers, and box spring covers. °· Wash bed sheets and blankets every week. Use hot water. Dry them in a dryer. °· Use blankets that are made of polyester or cotton. °· Wash your hands often. °· Keep pets out of your bedroom. °· When you exercise, try not to breathe in cold air. °General instructions °· Have a plan for getting medical care. Know these things: °? When to call your doctor. °? When to call local emergency services (911 in the [place] °? Where to go in an emergency. °· Stay up to date on your shots (immunizations). °· When you have an episode: °? Stay calm. °? Relax. °? Breathe slowly. °Contact a doctor if: °· Your muscles ache. °· Your chest hurts. °· The color of the mucus you cough up (sputum) changes from clear or white to yellow, green, gray, or bloody. °· The mucus you cough up gets thicker. °· You have a fever. °Get help right away if: °· The whistling sound gets worse, even after you  take your medicines. °· Your coughing gets worse. °· You find it even harder to breathe. °· Your chest hurts very much. °Summary °· Bronchospasm is when airways in the lungs get smaller. °· When this happens, it can be hard to breathe. You may cough. You may also make a whistling sound when you breathe. °· Stay away from things that cause you to have episodes. These include smoke or dust. °This information is not intended to replace advice given to you by your health care provider. Make sure you discuss any questions you have with your health care provider. °Document Released: 06/27/2009 Document Revised: 08/12/2017 Document Reviewed: 09/02/2016 °Elsevier Patient Education © 2020 [employer] ° °

## 2019-03-30 ENCOUNTER — Encounter: Payer: Self-pay | Admitting: *Deleted

## 2019-04-02 ENCOUNTER — Other Ambulatory Visit (HOSPITAL_COMMUNITY): Payer: Self-pay | Admitting: *Deleted

## 2019-04-02 ENCOUNTER — Other Ambulatory Visit: Payer: Self-pay

## 2019-04-02 NOTE — Progress Notes (Signed)
[facility] - [place], [place] - [address] [place] [id number] Phone: [phone] Fax: [phone]  [facility] [place] - [address] [place] [address] Phone: [phone] Fax: [phone]      Your procedure is scheduled on July 23  Report to [facility] "[facility]" at 0530 A.M., and check in at the Admitting office.  Call this number if you have problems the morning of surgery:  [phone]  Call [phone] if you have any questions prior to your surgery date Monday-Friday 8am-4pm    Remember:  Do not eat or drink after midnight the night before your surgery   Take these medicines the morning of surgery with A SIP OF WATER  albuterol (PROVENTIL HFA;VENTOLIN HFA) 108 (90 Base) budesonide (PULMICORT)   Please bring all inhalers with you the day of surgery.  busPIRone (BUSPAR) clonazePAM (KLONOPIN) docusate sodium (COLACE)  metoprolol succinate (TOPROL-XL) sertraline (ZOLOFT)    7 days prior to surgery STOP taking any Aspirin (unless otherwise instructed by your surgeon), Aleve, Naproxen, Ibuprofen, Motrin, Advil, Goody's, BC's, all herbal medications, fish oil, and all vitamins.    The Morning of Surgery  Do not wear jewelry, make-up or nail polish.  Do not wear lotions, powders, or perfumes/colognes, or deodorant  Do not shave 48 hours prior to surgery.  Men may shave face and neck.  Do not bring valuables to the hospital.  [facility] is not responsible for any belongings or valuables.  If you are a smoker, DO NOT Smoke 24 hours prior to surgery IF you wear a CPAP at night please bring your mask, tubing, and machine the morning of surgery   Remember that you must have someone to transport you home after your surgery, and remain with you for 24 hours if you are discharged the same day.   Contacts, glasses, hearing aids, dentures or bridgework may not be worn  into surgery.    Leave your suitcase in the car.  After surgery it may be brought to your room.  For patients admitted to the hospital, discharge time will be determined by your treatment team.  Patients discharged the day of surgery will not be allowed to drive home.    Special instructions:   [place]- Preparing For Surgery  Before surgery, you can play an important role. Because skin is not sterile, your skin needs to be as free of germs as possible. You can reduce the number of germs on your skin by washing with CHG (chlorahexidine gluconate) Soap before surgery.  CHG is an antiseptic cleaner which kills germs and bonds with the skin to continue killing germs even after washing.    Oral Hygiene is also important to reduce your risk of infection.  Remember - BRUSH YOUR TEETH THE MORNING OF SURGERY WITH YOUR REGULAR TOOTHPASTE  Please do not use if you have an allergy to CHG or antibacterial soaps. If your skin becomes reddened/irritated stop using the CHG.  Do not shave (including legs and underarms) for at least 48 hours prior to first CHG shower. It is OK to shave your face.  Please follow these instructions carefully.   1. Shower the NIGHT BEFORE SURGERY and the MORNING OF SURGERY with CHG Soap.   2. If you chose to wash your hair, wash your hair first as usual with your normal shampoo.  3. After you shampoo, rinse your hair and body  thoroughly to remove the shampoo.  4. Use CHG as you would any other liquid soap. You can apply CHG directly to the skin and wash gently with a scrungie or a clean washcloth.   5. Apply the CHG Soap to your body ONLY FROM THE NECK DOWN.  Do not use on open wounds or open sores. Avoid contact with your eyes, ears, mouth and genitals (private parts). Wash Face and genitals (private parts)  with your normal soap.   6. Wash thoroughly, paying special attention to the area where your surgery will be performed.  7. Thoroughly rinse your body with  warm water from the neck down.  8. DO NOT shower/wash with your normal soap after using and rinsing off the CHG Soap.  9. Pat yourself dry with a CLEAN TOWEL.  10. Wear CLEAN PAJAMAS to bed the night before surgery, wear comfortable clothes the morning of surgery  11. Place CLEAN SHEETS on your bed the night of your first shower and DO NOT SLEEP WITH PETS.    Day of Surgery:  Do not apply any deodorants/lotions. Please shower the morning of surgery with the CHG soap  Please wear clean clothes to the hospital/surgery center.   Remember to brush your teeth WITH YOUR REGULAR TOOTHPASTE.   Please read over the following fact sheets that you were given.

## 2019-04-03 ENCOUNTER — Ambulatory Visit (INDEPENDENT_AMBULATORY_CARE_PROVIDER_SITE_OTHER): Payer: Self-pay | Admitting: Pulmonary Disease

## 2019-04-03 ENCOUNTER — Ambulatory Visit (INDEPENDENT_AMBULATORY_CARE_PROVIDER_SITE_OTHER): Payer: Self-pay | Admitting: Thoracic Surgery (Cardiothoracic Vascular Surgery)

## 2019-04-03 ENCOUNTER — Ambulatory Visit (HOSPITAL_COMMUNITY)
Admission: RE | Admit: 2019-04-03 | Discharge: 2019-04-03 | Disposition: A | Discharge status: Home / Self Care | Payer: Medicaid Other | Source: Ambulatory Visit | Attending: Thoracic Surgery (Cardiothoracic Vascular Surgery) | Admitting: Thoracic Surgery (Cardiothoracic Vascular Surgery)

## 2019-04-03 ENCOUNTER — Encounter: Payer: Self-pay | Admitting: Thoracic Surgery (Cardiothoracic Vascular Surgery)

## 2019-04-03 ENCOUNTER — Other Ambulatory Visit: Payer: Self-pay

## 2019-04-03 ENCOUNTER — Other Ambulatory Visit (HOSPITAL_COMMUNITY)
Admit: 2019-04-03 | Discharge: 2019-04-03 | Disposition: A | Discharge status: Home / Self Care | Payer: Medicaid Other | Attending: Thoracic Surgery (Cardiothoracic Vascular Surgery) | Admitting: Thoracic Surgery (Cardiothoracic Vascular Surgery)

## 2019-04-03 ENCOUNTER — Encounter (HOSPITAL_COMMUNITY): Payer: Self-pay

## 2019-04-03 ENCOUNTER — Encounter (HOSPITAL_COMMUNITY)
Admission: RE | Admit: 2019-04-03 | Discharge: 2019-04-03 | Disposition: A | Discharge status: Home / Self Care | Payer: Medicaid Other | Source: Ambulatory Visit | Attending: Thoracic Surgery (Cardiothoracic Vascular Surgery) | Admitting: Thoracic Surgery (Cardiothoracic Vascular Surgery)

## 2019-04-03 ENCOUNTER — Encounter: Payer: Self-pay | Admitting: Pulmonary Disease

## 2019-04-03 VITALS — BP 116/72 | HR 100 | Temp 97.8°F | Resp 20 | Ht 64.0 in | Wt 226.0 lb

## 2019-04-03 VITALS — BP 110/62 | HR 104 | Ht 64.0 in | Wt 226.6 lb

## 2019-04-03 DIAGNOSIS — R590 Localized enlarged lymph nodes: Secondary | ICD-10-CM

## 2019-04-03 DIAGNOSIS — I10 Essential (primary) hypertension: Secondary | ICD-10-CM

## 2019-04-03 DIAGNOSIS — I050 Rheumatic mitral stenosis: Secondary | ICD-10-CM | POA: Insufficient documentation

## 2019-04-03 DIAGNOSIS — I361 Nonrheumatic tricuspid (valve) insufficiency: Secondary | ICD-10-CM

## 2019-04-03 DIAGNOSIS — D71 Functional disorders of polymorphonuclear neutrophils: Secondary | ICD-10-CM

## 2019-04-03 DIAGNOSIS — I272 Pulmonary hypertension, unspecified: Secondary | ICD-10-CM

## 2019-04-03 DIAGNOSIS — E669 Obesity, unspecified: Secondary | ICD-10-CM

## 2019-04-03 DIAGNOSIS — R0602 Shortness of breath: Secondary | ICD-10-CM

## 2019-04-03 DIAGNOSIS — I081 Rheumatic disorders of both mitral and tricuspid valves: Secondary | ICD-10-CM | POA: Insufficient documentation

## 2019-04-03 DIAGNOSIS — Z20828 Contact with and (suspected) exposure to other viral communicable diseases: Secondary | ICD-10-CM | POA: Insufficient documentation

## 2019-04-03 DIAGNOSIS — Z01818 Encounter for other preprocedural examination: Secondary | ICD-10-CM | POA: Insufficient documentation

## 2019-04-03 DIAGNOSIS — Z9889 Other specified postprocedural states: Secondary | ICD-10-CM

## 2019-04-03 DIAGNOSIS — I5032 Chronic diastolic (congestive) heart failure: Secondary | ICD-10-CM

## 2019-04-03 DIAGNOSIS — I052 Rheumatic mitral stenosis with insufficiency: Secondary | ICD-10-CM

## 2019-04-03 HISTORY — DX: Dyspnea, unspecified: R06.00

## 2019-04-03 HISTORY — DX: Pneumonia, unspecified organism: J18.9

## 2019-04-03 HISTORY — DX: Bipolar disorder, unspecified: F31.9

## 2019-04-03 LAB — BLOOD GAS, ARTERIAL
Acid-Base Excess: 0 mmol/L (ref 0.0–2.0)
Bicarbonate: 23.7 mmol/L (ref 20.0–28.0)
Drawn by: 42180
FIO2: 21
O2 Saturation: 95.1 %
Patient temperature: 98.6
pCO2 arterial: 35.6 mmHg (ref 32.0–48.0)
pH, Arterial: 7.439 (ref 7.350–7.450)
pO2, Arterial: 73.4 mmHg — ABNORMAL LOW (ref 83.0–108.0)

## 2019-04-03 LAB — COMPREHENSIVE METABOLIC PANEL
ALT: 16 U/L (ref 0–44)
AST: 17 U/L (ref 15–41)
Albumin: 3.7 g/dL (ref 3.5–5.0)
Alkaline Phosphatase: 72 U/L (ref 38–126)
Anion gap: 10 (ref 5–15)
BUN: 14 mg/dL (ref 6–20)
CO2: 21 mmol/L — ABNORMAL LOW (ref 22–32)
Calcium: 9.1 mg/dL (ref 8.9–10.3)
Chloride: 107 mmol/L (ref 98–111)
Creatinine, Ser: 0.97 mg/dL (ref 0.44–1.00)
GFR calc Af Amer: >60 mL/min (ref >60)
GFR calc non Af Amer: >60 mL/min (ref >60)
Glucose, Bld: 94 mg/dL (ref 70–99)
Potassium: 4.1 mmol/L (ref 3.5–5.1)
Sodium: 138 mmol/L (ref 135–145)
Total Bilirubin: 0.9 mg/dL (ref 0.3–1.2)
Total Protein: 7.4 g/dL (ref 6.5–8.1)

## 2019-04-03 LAB — SURGICAL PCR SCREEN
MRSA, PCR: NEGATIVE
Staphylococcus aureus: NEGATIVE

## 2019-04-03 LAB — URINALYSIS, ROUTINE W REFLEX MICROSCOPIC
Bilirubin Urine: NEGATIVE
Glucose, UA: NEGATIVE mg/dL
Ketones, ur: NEGATIVE mg/dL
Nitrite: NEGATIVE
Protein, ur: 30 mg/dL — AB
Specific Gravity, Urine: 1.025 (ref 1.005–1.030)
pH: 6 (ref 5.0–8.0)

## 2019-04-03 LAB — CBC
HCT: 37.4 % (ref 36.0–46.0)
Hemoglobin: 11 g/dL — ABNORMAL LOW (ref 12.0–15.0)
MCH: 25 pg — ABNORMAL LOW (ref 26.0–34.0)
MCHC: 29.4 g/dL — ABNORMAL LOW (ref 30.0–36.0)
MCV: 85 fL (ref 80.0–100.0)
Platelets: 460 10*3/uL — ABNORMAL HIGH (ref 150–400)
RBC: 4.4 MIL/uL (ref 3.87–5.11)
RDW: 21 % — ABNORMAL HIGH (ref 11.5–15.5)
WBC: 10.2 10*3/uL (ref 4.0–10.5)
nRBC: 0 % (ref 0.0–0.2)

## 2019-04-03 LAB — PROTIME-INR
INR: 1.2 (ref 0.8–1.2)
Prothrombin Time: 15.3 seconds — ABNORMAL HIGH (ref 11.4–15.2)

## 2019-04-03 LAB — SARS CORONAVIRUS 2 (TAT 6-24 HRS): SARS Coronavirus 2: NEGATIVE

## 2019-04-03 LAB — HEMOGLOBIN A1C
Hgb A1c MFr Bld: 5.9 % — ABNORMAL HIGH (ref 4.8–5.6)
Mean Plasma Glucose: 122.63 mg/dL

## 2019-04-03 LAB — APTT: aPTT: 36 seconds (ref 24–36)

## 2019-04-03 LAB — ABO/RH: ABO/RH(D): A POS

## 2019-04-03 NOTE — Progress Notes (Signed)
Synopsis: Referred in July 2020 for possible sarcoidosis Dr. Cornelius Moraswen, PCP: [facility]House, Eugenio HoesKaren A, [name]  Subjective:   PATIENT ID: Theresa DaneLatasha Y Crawford GENDER: female DOB: 02-05-1981, MRN: [id number]  Chief Complaint  Patient presents with  . Hospitalization Follow-up    Hospital F/U re: Hypoxia. Patient was very labored after walk to room. She reports she is having open heart surgery this thursday. Reports she was dx with Sarcoid.     This is Crawford 38 year old female that it was originally seen by me in the hospital on 03/07/2019.  She has Crawford past medical history of possible sarcoidosis and was admitted for decompensated heart failure secondary to severe mitral stenosis and regurgitation.  She was seen and evaluated by cardiothoracic surgery during this hospitalization for potential valvular repair.  Since this hospitalization she had been discharged home and ultimately readmitted due to recurrent symptoms.  I spoke with Dr. Cornelius Moraswen her cardiothoracic surgeon last week about her ongoing cough symptoms.  She was started on Pulmicort during this admission.  She does not have Crawford true diagnosis of sarcoidosis as she has never had Crawford biopsy.  With history taking she does have lacrimal gland hyperplasia as well as Crawford history of tender nodules of the lower extremities consistent with Crawford diagnosis of erythema edema nodosa.  At this point bronchoscopy is too dangerous to consider due to her pulmonary hypertension and mitral stenosis.  Therefore with planned valvular repair/replacement of this Crawford biopsy of Crawford nodal structure from the mediastinum would be prudent on diagnosis of her sarcoid.  Patient is seen today for follow-up from hospitalization.  Overall she is doing well posthospitalization.  She is very anxious about having her surgery which is scheduled for this Thursday.  She does think that the albuterol and Pulmicort has helped her cough.   Past Medical History:  Diagnosis Date  . Anxiety   . Arthritis    "lower back"  (04/04/2018)  . Chronic bronchitis (HCC)   . Chronic diastolic CHF (congestive heart failure) (HCC)   . Chronic lower back pain   . DDD (degenerative disc disease), lumbar   . Depression   . GERD (gastroesophageal reflux disease)   . Headache    "weekly" (04/04/2018)  . Heart murmur   . Hypertension   . Mitral valve disease   . Normocytic anemia   . Obesity   . Palpitations   . Pericarditis   . Premature atrial contractions   . Pulmonary hypertension (HCC)   . PVC's (premature ventricular contractions)    Crawford. h/o palpitations with event monitor in 03/2017 showing NSR with PACs/PVCs.  . Recurrent mitral valve stenosis and regurgitation s/p mitral valve repair   . S/P mitral valve repair 03/27/2013   [place]. Darcus [place] complex valvuloplasty including resuspension of entire posterior leaflet using Gore-tex neochords and 30 mm Edwards Physio ring annuloplasty  . Sarcoidosis   . Tobacco abuse   . Tricuspid regurgitation      Family History  Problem Relation Age of Onset  . Heart failure Father        just received LVAD  . Heart disease Paternal Grandfather        stent placement     Past Surgical History:  Procedure Laterality Date  . ABDOMINAL HERNIA REPAIR  ~ 2011  . ABDOMINAL HYSTERECTOMY  2011   "partial"; PID w/problem with fallopian tubes, had left fallopian and left ovary removed, pt still having periods  . CARDIAC CATHETERIZATION  2014  .  CESAREAN SECTION  2004; 2009  . HERNIA REPAIR    . MITRAL VALVE REPAIR  03/27/2013   Dr. Darcus Austin - [place], [place]. - complex valvuloplasty including resuspension of posterior leaflet with 30 mm CE Physio ring annuloplasty  . MULTIPLE EXTRACTIONS WITH ALVEOLOPLASTY N/Crawford 04/03/2018   Procedure: Extraction of tooth #30 with alveoloplasty and gross debridement of remaining teeth;  Surgeon: Charlynne Pander, DDS;  Location: [facility] OR;  Service: Oral Surgery;  Laterality: N/Crawford;  . PARTIAL HYSTERECTOMY  2011   PID  w/problem with fallopian tubes, had left fallopian and left ovary removed, pt still having periods  . RIGHT HEART CATH N/Crawford 03/27/2019   Procedure: RIGHT HEART CATH;  Surgeon: Laurey Morale, MD;  Location: [facility] INVASIVE CV LAB;  Service: Cardiovascular;  Laterality: N/Crawford;  . RIGHT/LEFT HEART CATH AND CORONARY ANGIOGRAPHY N/Crawford 03/05/2019   Procedure: RIGHT/LEFT HEART CATH AND CORONARY ANGIOGRAPHY;  Surgeon: Kathleene Hazel, MD;  Location: [facility] INVASIVE CV LAB;  Service: Cardiovascular;  Laterality: N/Crawford;  . TEE WITHOUT CARDIOVERSION N/Crawford 05/26/2016   Procedure: TRANSESOPHAGEAL ECHOCARDIOGRAM (TEE);  Surgeon: [name], MD;  Location: [facility] ENDO SUITE;  Service: Cardiovascular;  Laterality: N/Crawford;  . TEE WITHOUT CARDIOVERSION N/Crawford 01/25/2018   Procedure: TRANSESOPHAGEAL ECHOCARDIOGRAM (TEE) WITH PROPOFOL;  Surgeon: Jonelle Sidle, MD;  Location: [facility] ENDO SUITE;  Service: Cardiovascular;  Laterality: N/Crawford;  . TEE WITHOUT CARDIOVERSION N/Crawford 03/05/2019   Procedure: TRANSESOPHAGEAL ECHOCARDIOGRAM (TEE);  Surgeon: Pricilla Riffle, MD;  Location: [facility] ENDOSCOPY;  Service: Cardiovascular;  Laterality: N/Crawford;    Social History   Socioeconomic History  . Marital status: Single    Spouse name: Not on file  . Number of children: 2  . Years of education: Not on file  . Highest education level: Not on file  Occupational History  . Not on file  Social Needs  . Financial resource strain: Not on file  . Food insecurity    Worry: Not on file    Inability: Not on file  . Transportation needs    Medical: Not on file    Non-medical: Not on file  Tobacco Use  . Smoking status: Current Some Day Smoker    Packs/day: 0.25    Years: 24.00    Pack years: 6.00    Types: Cigarettes    Start date: 11/03/1999  . Smokeless tobacco: Never Used  Substance and Sexual Activity  . Alcohol use: Yes    Comment: 04/04/2018 "1-2 beers/month; if that"  . Drug use: Not Currently  . Sexual activity: Not Currently   Lifestyle  . Physical activity    Days per week: Not on file    Minutes per session: Not on file  . Stress: Not on file  Relationships  . Social Musician on phone: Not on file    Gets together: Not on file    Attends religious service: Not on file    Active member of club or organization: Not on file    Attends meetings of clubs or organizations: Not on file    Relationship status: Not on file  . Intimate partner violence    Fear of current or ex partner: Not on file    Emotionally abused: Not on file    Physically abused: Not on file    Forced sexual activity: Not on file  Other Topics Concern  . Not on file  Social History Narrative  . Not on file     Allergies  Allergen Reactions  . Bee Venom Anaphylaxis  . Lisinopril Anaphylaxis, Shortness Of Breath, Swelling and Other (See Comments)    Throat swells  . Penicillins Shortness Of Breath, Swelling and Other (See Comments)    Has patient had Crawford PCN reaction causing immediate rash, facial/tongue/throat swelling, SOB or lightheadedness with hypotension: Yes Has patient had Crawford PCN reaction causing severe rash involving mucus membranes or skin necrosis: Yes Has patient had Crawford PCN reaction that required hospitalization No Has patient had Crawford PCN reaction occurring within the last 10 years: No If all of the above answers are "NO", then may proceed with Cephalosporin use.   Marland Kitchen. Perflutren Lipid Microsphere Shortness Of Breath and Other (See Comments)    Chest Pain/Tightness, also  . Shellfish Allergy Anaphylaxis  . Contrast Media [Iodinated Diagnostic Agents] Hives, Itching and Other (See Comments)    Itching and hives to throat, neck, face and arms.   No difficulty breathing.   [facility]. Contrast Dye     Outpatient Medications Prior to Visit  Medication Sig Dispense Refill  . albuterol (PROVENTIL HFA;VENTOLIN HFA) 108 (90 Base) MCG/ACT inhaler Inhale 2 puffs into the  lungs every 6 (six) hours as needed for wheezing or shortness of breath.    . budesonide (PULMICORT) 180 MCG/ACT inhaler Inhale 1 puff into the lungs 2 (two) times Crawford day. 1 each 3  . busPIRone (BUSPAR) 7.5 MG tablet Take 1 tablet (7.5 mg total) by mouth 3 (three) times daily. 90 tablet 1  . clonazePAM (KLONOPIN) 0.5 MG tablet Take 1 tablet (0.5 mg total) by mouth 2 (two) times daily. 60 tablet 0  . docusate sodium (COLACE) 100 MG capsule Take 100 mg by mouth daily as needed for moderate constipation.     Marland Kitchen. EPINEPHrine (EPIPEN 2-PAK) 0.3 mg/0.3 mL IJ SOAJ injection Inject 0.3 Units into the muscle once.     . ferrous sulfate 325 (65 FE) MG tablet Take 1 tablet (325 mg total) by mouth 2 (two) times daily with Crawford meal. (Patient taking differently: Take 325 mg by mouth daily with breakfast. )  3  . folic acid (FOLVITE) 800 MCG tablet Take 400 mcg by mouth daily.    . hydrOXYzine (VISTARIL) 25 MG capsule Take 1 capsule (25 mg total) by mouth 3 (three) times daily as needed. 30 capsule 3  . metoprolol succinate (TOPROL-XL) 25 MG 24 hr tablet Take 1 tablet (25 mg total) by mouth 2 (two) times Crawford day. 60 tablet 3  . potassium chloride SA (K-DUR) 20 MEQ tablet Take 2 tablets (40 mEq total) by mouth daily. Take 2 tablets daily 60 tablet 3  . Probiotic Product (PROBIOTIC PO) Take 1 capsule by mouth daily.    . QUEtiapine (SEROQUEL) 50 MG tablet Take 1 tablet (50 mg total) by mouth at bedtime. 30 tablet 0  . sertraline (ZOLOFT) 100 MG tablet Take 1 tablet (100 mg total) by mouth daily. 30 tablet 3  . torsemide (DEMADEX) 20 MG tablet Take 2 tablets (40 mg total) by mouth daily. 30 tablet 3  . traZODone (DESYREL) 50 MG tablet Take 0.5 tablets (25 mg total) by mouth at bedtime. 30 tablet 3  . vitamin B-12 (CYANOCOBALAMIN) 1000 MCG tablet Take 1,000 mcg by mouth daily.     No facility-administered medications prior to visit.     Review of Systems  Constitutional: Negative for chills, fever, malaise/fatigue and  weight loss.  HENT: Negative for hearing loss, sore throat  and tinnitus.   Eyes: Negative for blurred vision and double vision.  Respiratory: Positive for cough and shortness of breath. Negative for hemoptysis, sputum production, wheezing and stridor.   Cardiovascular: Negative for chest pain, palpitations, orthopnea, leg swelling and PND.  Gastrointestinal: Negative for abdominal pain, constipation, diarrhea, heartburn, nausea and vomiting.  Genitourinary: Negative for dysuria, hematuria and urgency.  Musculoskeletal: Negative for joint pain and myalgias.  Skin: Negative for itching and rash.  Neurological: Negative for dizziness, tingling, weakness and headaches.  Endo/Heme/Allergies: Negative for environmental allergies. Does not bruise/bleed easily.  Psychiatric/Behavioral: Negative for depression. The patient is not nervous/anxious and does not have insomnia.   All other systems reviewed and are negative.    Objective:  Physical Exam Vitals signs reviewed.  Constitutional:      General: She is not in acute distress.    Appearance: She is well-developed.  HENT:     Head: Normocephalic and atraumatic.     Comments: Bilateral lacrimal gland hyperplasia Eyes:     General: No scleral icterus.    Conjunctiva/sclera: Conjunctivae normal.     Pupils: Pupils are equal, round, and reactive to light.  Neck:     Musculoskeletal: Neck supple.     Vascular: No JVD.     Trachea: No tracheal deviation.  Cardiovascular:     Rate and Rhythm: Normal rate and regular rhythm.     Heart sounds: Normal heart sounds. No murmur.  Pulmonary:     Effort: No tachypnea, accessory muscle usage or respiratory distress.     Breath sounds: No stridor. No wheezing, rhonchi or rales.     Comments: Few crackles in the bilateral bases Abdominal:     General: Bowel sounds are normal. There is no distension.     Palpations: Abdomen is soft.     Tenderness: There is no abdominal tenderness.   Musculoskeletal:        General: No tenderness.     Right lower leg: Edema present.     Left lower leg: Edema present.     Comments: Trace bilateral lower extremity edema  Lymphadenopathy:     Cervical: No cervical adenopathy.  Skin:    General: Skin is warm and dry.     Capillary Refill: Capillary refill takes less than 2 seconds.     Findings: No rash.     Comments: Pigmented patches of the lower extremities  Neurological:     Mental Status: She is alert and oriented to person, place, and time.  Psychiatric:        Behavior: Behavior normal.      Vitals:   04/03/19 1137  BP: 110/62  Pulse: (!) 104  SpO2: 94%  Weight: 226 lb 9.6 oz (102.8 kg)  Height:  (1.626 m)   94% on RA BMI Readings from Last 3 Encounters:  04/03/19 38.90 kg/m  03/29/19 39.05 kg/m  03/19/19 37.59 kg/m   Wt Readings from Last 3 Encounters:  04/03/19 226 lb 9.6 oz (102.8 kg)  03/29/19 227 lb 8.2 oz (103.2 kg)  03/19/19 219 lb (99.3 kg)     CBC    Component Value Date/Time   WBC 6.9 03/28/2019 0252   RBC 4.15 03/28/2019 0252   HGB 10.5 (L) 03/28/2019 0252   HCT 34.5 (L) 03/28/2019 0252   PLT 362 03/28/2019 0252   MCV 83.1 03/28/2019 0252   MCH 25.3 (L) 03/28/2019 0252   MCHC 30.4 03/28/2019 0252   RDW 20.0 (H) 03/28/2019 0252   LYMPHSABS  1.7 03/24/2019 0400   MONOABS 1.0 03/24/2019 0400   EOSABS 0.1 03/24/2019 0400   BASOSABS 0.1 03/24/2019 0400    Chest Imaging: 11/06/2018 CT chest: Mediastinal adenopathy, granulomatous disease, septal thickening, bilateral groundglass opacities worse within the bases. The patient's images have been independently reviewed by me.    03/08/2019 cardiac MRI: LVEF 54%, no regional wall abnormalities, RVEF 35%.  No late gadolinium enhancement, no suggestion of cardiac sarcoidosis.  Pulmonary Functions Testing Results: PFT Results Latest Ref Rng & Units 03/07/2019  FVC-Pre L 2.24  FVC-Predicted Pre % 72  FVC-Post L 2.39  FVC-Predicted Post %  77  Pre FEV1/FVC % % 94  Post FEV1/FCV % % 93  FEV1-Pre L 2.10  FEV1-Predicted Pre % 81  FEV1-Post L 2.22  DLCO UNC% % 50  DLCO COR %Predicted % 78  TLC L 3.76  TLC % Predicted % 74  RV % Predicted % 94    FeNO: None   Pathology: None   Echocardiogram:   TEE 6/22 IMPRESSIONS  1. The left ventricle has mildly reduced systolic function, with an ejection fraction of 45-50%. The cavity size was normal.  2. The right ventricle has mildly reduced systolic function. The cavity was mildly enlarged.  3. LA, LAA without masses.  4. S/P Mitral valve repair. 2D and 3D imaging done. The mitral leaflet is thickened with restricted motion, particularly the posterior leaflet. Peak and mean gradients through the valve are 13 and 8 mm Hg respectively. MVA by Pressure T1/2 is 1.85 cm2  consistent with mild MS 2D imaging suggests more moderate.  5. Tricuspid valve regurgitation is mild-moderate.  6. The aortic valve is tricuspid Aortic valve regurgitation is trivial by color flow Doppler.   Heart Catheterization:   [facility] - Dr. Jearld PiesMcClean 03/27/2019 1. Mildly elevated PCWP.  2. Mild pulmonary venous hypertension, PVR 2.2 WU.  3. Preserved cardiac output.   There appears to be minimal fixed pulmonary hypertension, it does not appear that there is Crawford significant PAH component from sarcoidosis. This would suggest that CHF is primarily related to mitral stenosis.     Assessment & Plan:     ICD-10-CM   1. Mitral valve stenosis, unspecified etiology  I05.0   2. Mediastinal adenopathy  R59.0   3. Granulomatous disease (HCC)  D71     Discussion:  This is Crawford 38 year old African-American female with Crawford presumed diagnosis of sarcoidosis.  She has CT radiographic evidence to suggest sarcoid.  She also has bilateral lacrimal gland enlargement as well as Crawford history of recurrent migratory lesions of the lower extremities possibly consistent with erythema nodosum.  She has not had Crawford tissue biopsy of sarcoid in  the past that she knows of.  It is possible that she had something at her [place] where she had her initial valve surgery.  At this time she is planned for valve surgery on Thursday.  With plans for mediastinal node sampling at the time of the surgery to help make Crawford diagnosis of sarcoid.  She can continue the use of her albuterol and Pulmicort nebulizer at this time.  If her pathology is consistent with sarcoid I would prefer to avoid systemic oral steroid therapy until her sternotomy wound is well-healed.  We will coordinate this with cardiothoracic surgery.  Patient to follow-up in our clinic in 6 to 8 weeks following surgery.  Greater than 50% of this patient's 25-minute visit was spent face-to-face discussing above recommendations and treatment plan as well as reviewing  the patient's images.   Current Outpatient Medications:  .  albuterol (PROVENTIL HFA;VENTOLIN HFA) 108 (90 Base) MCG/ACT inhaler, Inhale 2 puffs into the lungs every 6 (six) hours as needed for wheezing or shortness of breath., Disp: , Rfl:  .  budesonide (PULMICORT) 180 MCG/ACT inhaler, Inhale 1 puff into the lungs 2 (two) times Crawford day., Disp: 1 each, Rfl: 3 .  busPIRone (BUSPAR) 7.5 MG tablet, Take 1 tablet (7.5 mg total) by mouth 3 (three) times daily., Disp: 90 tablet, Rfl: 1 .  clonazePAM (KLONOPIN) 0.5 MG tablet, Take 1 tablet (0.5 mg total) by mouth 2 (two) times daily., Disp: 60 tablet, Rfl: 0 .  docusate sodium (COLACE) 100 MG capsule, Take 100 mg by mouth daily as needed for moderate constipation. , Disp: , Rfl:  .  EPINEPHrine (EPIPEN 2-PAK) 0.3 mg/0.3 mL IJ SOAJ injection, Inject 0.3 Units into the muscle once. , Disp: , Rfl:  .  ferrous sulfate 325 (65 FE) MG tablet, Take 1 tablet (325 mg total) by mouth 2 (two) times daily with Crawford meal. (Patient taking differently: Take 325 mg by mouth daily with breakfast. ), Disp: , Rfl: 3 .  folic acid (FOLVITE) 956 MCG tablet, Take 400 mcg by mouth daily.,  Disp: , Rfl:  .  hydrOXYzine (VISTARIL) 25 MG capsule, Take 1 capsule (25 mg total) by mouth 3 (three) times daily as needed., Disp: 30 capsule, Rfl: 3 .  metoprolol succinate (TOPROL-XL) 25 MG 24 hr tablet, Take 1 tablet (25 mg total) by mouth 2 (two) times Crawford day., Disp: 60 tablet, Rfl: 3 .  potassium chloride SA (K-DUR) 20 MEQ tablet, Take 2 tablets (40 mEq total) by mouth daily. Take 2 tablets daily, Disp: 60 tablet, Rfl: 3 .  Probiotic Product (PROBIOTIC PO), Take 1 capsule by mouth daily., Disp: , Rfl:  .  QUEtiapine (SEROQUEL) 50 MG tablet, Take 1 tablet (50 mg total) by mouth at bedtime., Disp: 30 tablet, Rfl: 0 .  sertraline (ZOLOFT) 100 MG tablet, Take 1 tablet (100 mg total) by mouth daily., Disp: 30 tablet, Rfl: 3 .  torsemide (DEMADEX) 20 MG tablet, Take 2 tablets (40 mg total) by mouth daily., Disp: 30 tablet, Rfl: 3 .  traZODone (DESYREL) 50 MG tablet, Take 0.5 tablets (25 mg total) by mouth at bedtime., Disp: 30 tablet, Rfl: 3 .  vitamin B-12 (CYANOCOBALAMIN) 1000 MCG tablet, Take 1,000 mcg by mouth daily., Disp: , Rfl:    Garner Nash, DO [facility] 04/03/2019 11:55 AM

## 2019-04-03 NOTE — Patient Instructions (Signed)
   Continue taking all current medications without change through the day before surgery.  Have nothing to eat or drink after midnight the night before surgery.  On the morning of surgery take only Klonopin with a sip of water.  You may use your inhaler.

## 2019-04-03 NOTE — H&P (View-Only) (Signed)
[address]       [name] [address]             [phone]     CARDIOTHORACIC SURGERY OFFICE NOTE  Referring Provider is Jake Bathe, MD Primary Cardiologist is Prentice Docker, MD Advanced Heart Failure Cardiologist is Laurey Morale, MD Primary Pulmonologist is Icard, Rachel Bo, MD PCP is Grayce Sessions, NP  Chief Complaint  Patient presents with   Mitral Stenosis    Further discuss surgery scheduled for 04/05/19   Tricuspid Regurgitation    HPI:  Patient is an obese 38 year old African-American female with long standing history of mitral valve disease and chronic diastolic congestive heart failure status post mitral valve repair in 2014 who returns to the office today with tentative plans to proceed with redo mitral valve replacement and tricuspid valve repair later this week.  Patient states that her heart disease began approximately 10 years ago when she was pregnant with her second child. She states that she developed pericarditis and congestive heart failure. She was eventually diagnosed with mitral valve disease and sarcoidosis. She underwent mitral valve repair in 2014. At the time she lived in [place]. By report her surgical repair included "resuspension of the posterior leaflet" and placement of a complete, semi-rigid CE Physio annuloplasty ring, size 30 mm.  Following surgery she states that she quickly began to experience worsening problems of congestive heart failure. Within a year following surgery she had been hospitalized on several occasions with acute exacerbation of chronic diastolic congestive heart failure. She was referred back to her cardiac surgeon and the possibility of redo surgery was discussed. She moved to [place] in January 2018. She has been followed intermittently since then by Dr. Purvis Sheffield, and she has been hospitalized on numerous occasions with acute exacerbation of chronic diastolic  congestive heart failure and pulmonary edema. Follow-up echocardiograms have documented the presence of normal left ventricular systolic function with at least mild mitral regurgitation and increased transvalvular gradients consistent with moderate to severe mitral stenosis. I had the opportunity to see her in consultation on 03/20/2018 and we discussed the indications, risks and potential benefits of redo mitral valve repair or replacement with possible tricuspid valve repair.  Continued medical therapy was discussed as an alternative, particularly given the likelihood that her mitral valve would need to be replaced at the time of surgery.  She expressed interest in proceeding with further diagnostic testing and surgery and was referred for dental clearance and diagnostic cardiac catheterization.  She underwent dental extraction and was scheduled for catheterization, but she cancelled at the last minute.  By report she had a "breakdown" and was hospitalized at [facility] for depression with suicidal ideation.  Ultimately the patient's psychiatric problems were brought under improved control.  Over the past 6 months the patient has had 3 different hospitalizations for acute exacerbation of shortness of breath.  1 month ago she was hospitalized with severe resting shortness of breath and hypoxemic respiratory failure with pulmonary edema on chest x-ray.  I had the opportunity to see her in follow-up at that time.  She was also evaluated by Dr. Tonia Brooms from pulmonary medicine regarding likely diagnosis of sarcoidosis.  She did well with diuretic therapy and was discharged from the hospital with plans to proceed with elective surgical intervention this month.  However, the patient was readmitted to the hospital again March 24, 2019 with another acute exacerbation of resting shortness of breath with increased  cough.  She was evaluated by the advanced heart failure team and underwent repeat right heart  catheterization by Dr. Shirlee Latch.  She was also seen by the pharmacy team and counseled regarding what it would be like for her to be treated using warfarin for long-term anticoagulation.  Plans were made to proceed with elective redo mitral valve replacement and tricuspid valve repair later this week.  She returns to the office today with her mother present for follow-up consultation.  She reports no new problems or complaints.  Her breathing has been stable since hospital discharge.  Her weight is been stable.  She still has an intermittent dry nonproductive cough.  She denies fevers or chills.  She has been practicing social distancing and wearing a facemask whenever out of the house.  Patient is single and lives with her mother in [place].  She is unemployed.  She has 2 children that do not live with her, ages 21 and 12.  She describes chronic symptoms of exertional shortness of breath, orthopnea, lower extremity edema, and a chronic cough which have waxed and waned in severity but she states began immediately after her surgery in 2014.  She has been hospitalized on numerous occasions for acute exacerbations of chronic congestive heart failure and symptoms always improve following intravenous diuresis.  She states that recently at home she has been taking Bumex regularly but despite this she has problems with fluid retention.  She has chronic cough with occasional episodes of frank mopped assist.  She reports some occasional tightness across her chest which is typically transient and fleeting and not necessarily associated with shortness of breath.  Tightness in her chest is not related to physical exertion.  She admits to problems with chronic anxiety including the fact that she was recently hospitalized at behavioral health because of extreme anxiety with suicidal ideation.  She states that she quit smoking approximately 1 month ago and she denies any illicit drug use.    Past Medical History:    Diagnosis Date   Anxiety    Arthritis    "lower back" (04/04/2018)   Bipolar disorder (HCC)    Chronic bronchitis (HCC)    Chronic diastolic CHF (congestive heart failure) (HCC)    Chronic lower back pain    DDD (degenerative disc disease), lumbar    Depression    Dyspnea    GERD (gastroesophageal reflux disease)    Headache    "weekly" (04/04/2018), miragrains in past (04/03/2019)   Heart murmur    Hypertension    Mitral valve disease    Normocytic anemia    Obesity    Palpitations    Pericarditis    Pneumonia    Premature atrial contractions    Pulmonary hypertension (HCC)    PVC's (premature ventricular contractions)    a. h/o palpitations with event monitor in 03/2017 showing NSR with PACs/PVCs.   Recurrent mitral valve stenosis and regurgitation s/p mitral valve repair    S/P mitral valve repair 03/27/2013   Dr. Darcus Austin - Natale Milch, [place] - complex valvuloplasty including resuspension of entire posterior leaflet using Gore-tex neochords and 30 mm Edwards Physio ring annuloplasty   Sarcoidosis    Tobacco abuse    Tricuspid regurgitation     Past Surgical History:  Procedure Laterality Date   ABDOMINAL HERNIA REPAIR  ~ 2011   CARDIAC CATHETERIZATION  2014   CESAREAN SECTION  2004; 2009   HERNIA REPAIR     MITRAL VALVE REPAIR  03/27/2013   Dr.  Darcus Austin [place], [place]. - complex valvuloplasty including resuspension of posterior leaflet with 30 mm CE Physio ring annuloplasty   MULTIPLE EXTRACTIONS WITH ALVEOLOPLASTY N/A 04/03/2018   Procedure: Extraction of tooth #30 with alveoloplasty and gross debridement of remaining teeth;  Surgeon: Charlynne Pander, DDS;  Location: [facility] OR;  Service: Oral Surgery;  Laterality: N/A;   PARTIAL HYSTERECTOMY  2011   PID w/problem with fallopian tubes, had left fallopian and left ovary removed, pt still having periods   RIGHT HEART CATH N/A 03/27/2019   Procedure: RIGHT HEART CATH;  Surgeon:  Laurey Morale, MD;  Location: [facility] INVASIVE CV LAB;  Service: Cardiovascular;  Laterality: N/A;   RIGHT/LEFT HEART CATH AND CORONARY ANGIOGRAPHY N/A 03/05/2019   Procedure: RIGHT/LEFT HEART CATH AND CORONARY ANGIOGRAPHY;  Surgeon: Kathleene Hazel, MD;  Location: [facility] INVASIVE CV LAB;  Service: Cardiovascular;  Laterality: N/A;   TEE WITHOUT CARDIOVERSION N/A 05/26/2016   Procedure: TRANSESOPHAGEAL ECHOCARDIOGRAM (TEE);  Surgeon: [name], MD;  Location: [facility] ENDO SUITE;  Service: Cardiovascular;  Laterality: N/A;   TEE WITHOUT CARDIOVERSION N/A 01/25/2018   Procedure: TRANSESOPHAGEAL ECHOCARDIOGRAM (TEE) WITH PROPOFOL;  Surgeon: Jonelle Sidle, MD;  Location: [facility] ENDO SUITE;  Service: Cardiovascular;  Laterality: N/A;   TEE WITHOUT CARDIOVERSION N/A 03/05/2019   Procedure: TRANSESOPHAGEAL ECHOCARDIOGRAM (TEE);  Surgeon: Pricilla Riffle, MD;  Location: [facility] ENDOSCOPY;  Service: Cardiovascular;  Laterality: N/A;    Family History  Problem Relation Age of Onset   Heart failure Father        just received LVAD   Heart disease Paternal Grandfather        stent placement    Social History   Socioeconomic History   Marital status: Single    Spouse name: Not on file   Number of children: 2   Years of education: Not on file   Highest education level: Not on file  Occupational History   Not on file  Social Needs   Financial resource strain: Not on file   Food insecurity    Worry: Not on file    Inability: Not on file   Transportation needs    Medical: Not on file    Non-medical: Not on file  Tobacco Use   Smoking status: Current Some Day Smoker    Packs/day: 0.25    Years: 24.00    Pack years: 6.00    Types: Cigarettes    Start date: 11/03/1999   Smokeless tobacco: Never Used  Substance and Sexual Activity   Alcohol use: Not Currently    Comment: 04/04/2018 "1-2 beers/month; if that"   Drug use: Not Currently   Sexual activity: Not Currently    Lifestyle   Physical activity    Days per week: Not on file    Minutes per session: Not on file   Stress: Not on file  Relationships   Social connections    Talks on phone: Not on file    Gets together: Not on file    Attends religious service: Not on file    Active member of club or organization: Not on file    Attends meetings of clubs or organizations: Not on file    Relationship status: Not on file   Intimate partner violence    Fear of current or ex partner: Not on file    Emotionally abused: Not on file    Physically abused: Not on file    Forced sexual activity: Not on file  Other  Topics Concern   Not on file  Social History Narrative   Not on file    Current Outpatient Medications  Medication Sig Dispense Refill   albuterol (PROVENTIL HFA;VENTOLIN HFA) 108 (90 Base) MCG/ACT inhaler Inhale 2 puffs into the lungs every 6 (six) hours as needed for wheezing or shortness of breath.     budesonide (PULMICORT) 180 MCG/ACT inhaler Inhale 1 puff into the lungs 2 (two) times a day. 1 each 3   busPIRone (BUSPAR) 7.5 MG tablet Take 1 tablet (7.5 mg total) by mouth 3 (three) times daily. 90 tablet 1   clonazePAM (KLONOPIN) 0.5 MG tablet Take 1 tablet (0.5 mg total) by mouth 2 (two) times daily. 60 tablet 0   docusate sodium (COLACE) 100 MG capsule Take 100 mg by mouth daily as needed for moderate constipation.      EPINEPHrine (EPIPEN 2-PAK) 0.3 mg/0.3 mL IJ SOAJ injection Inject 0.3 Units into the muscle once.      ferrous sulfate 325 (65 FE) MG tablet Take 1 tablet (325 mg total) by mouth 2 (two) times daily with a meal. (Patient taking differently: Take 325 mg by mouth daily with breakfast. )  3   folic acid (FOLVITE) 800 MCG tablet Take 400 mcg by mouth daily.     hydrOXYzine (VISTARIL) 25 MG capsule Take 1 capsule (25 mg total) by mouth 3 (three) times daily as needed. 30 capsule 3   metoprolol succinate (TOPROL-XL) 25 MG 24 hr tablet Take 1 tablet (25 mg total)  by mouth 2 (two) times a day. 60 tablet 3   potassium chloride SA (K-DUR) 20 MEQ tablet Take 2 tablets (40 mEq total) by mouth daily. Take 2 tablets daily 60 tablet 3   Probiotic Product (PROBIOTIC PO) Take 1 capsule by mouth daily.     QUEtiapine (SEROQUEL) 50 MG tablet Take 1 tablet (50 mg total) by mouth at bedtime. 30 tablet 0   sertraline (ZOLOFT) 100 MG tablet Take 1 tablet (100 mg total) by mouth daily. 30 tablet 3   torsemide (DEMADEX) 20 MG tablet Take 2 tablets (40 mg total) by mouth daily. 30 tablet 3   traZODone (DESYREL) 50 MG tablet Take 0.5 tablets (25 mg total) by mouth at bedtime. 30 tablet 3   vitamin B-12 (CYANOCOBALAMIN) 1000 MCG tablet Take 1,000 mcg by mouth daily.     No current facility-administered medications for this visit.     Allergies  Allergen Reactions   Bee Venom Anaphylaxis   Lisinopril Anaphylaxis, Shortness Of Breath, Swelling and Other (See Comments)    Throat swells   Penicillins Shortness Of Breath, Swelling and Other (See Comments)    Has patient had a PCN reaction causing immediate rash, facial/tongue/throat swelling, SOB or lightheadedness with hypotension: Yes Has patient had a PCN reaction causing severe rash involving mucus membranes or skin necrosis: Yes Has patient had a PCN reaction that required hospitalization No Has patient had a PCN reaction occurring within the last 10 years: No If all of the above answers are "NO", then may proceed with Cephalosporin use.    Perflutren Lipid Microsphere Shortness Of Breath and Other (See Comments)    Chest Pain/Tightness, also   Shellfish Allergy Anaphylaxis   Contrast Media [Iodinated Diagnostic Agents] Hives, Itching and Other (See Comments)    Itching and hives to throat, neck, face and arms.   No difficulty breathing.   This was [facility] approximately 2015. Contrast Dye     Review of  Systems:              General:                      normal appetite,  decreased energy, + weight gain, + weight loss, no fever             Cardiac:                       no chest pain with exertion, + occasional fleeting chest pain at rest, +SOB with exertion, + resting SOB, no PND, + orthopnea, + palpitations, no arrhythmia, no atrial fibrillation, + LE edema, no dizzy spells, no syncope             Respiratory:                 + shortness of breath, no home oxygen, no productive cough, + chronic dry cough, no bronchitis, + wheezing, occasional hemoptysis, no asthma, no pain with inspiration or cough, no sleep apnea, no CPAP at night             GI:                               no difficulty swallowing, no reflux, no frequent heartburn, no hiatal hernia, no abdominal pain, no constipation, no diarrhea, no hematochezia, no hematemesis, no melena             GU:                              no dysuria,  no frequency, no urinary tract infection, no hematuria, no kidney stones, no kidney disease             Vascular:                     no pain suggestive of claudication, no pain in feet, no leg cramps, no varicose veins, no DVT, no non-healing foot ulcer             Neuro:                         no stroke, no TIA's, no seizures, no headaches, + temporary blindness one eye typically on left side,  no slurred speech, no peripheral neuropathy, no chronic pain, no instability of gait, no memory/cognitive dysfunction             Musculoskeletal:         no arthritis, no joint swelling, no myalgias, no difficulty walking, normal mobility              Skin:                            no rash, no itching, no skin infections, no pressure sores or ulcerations             Psych:                         + anxiety, + depression, + nervousness, no unusual recent stress             Eyes:                           +  blurry vision, no floaters, + recent vision changes, + wears glasses or contacts             ENT:                            no hearing loss, no loose or painful teeth, no  dentures, last saw dentist within the past year             Hematologic:               no easy bruising, no abnormal bleeding, no clotting disorder, no frequent epistaxis             Endocrine:                   no diabetes, does not check CBG's at home                              Physical Exam:   BP 116/72    Pulse 100    Temp 97.8 F (36.6 C) (Skin)    Resp 20    Ht 5\' 4"  (1.626 m)    Wt 226 lb (102.5 kg)    LMP 03/29/2019    SpO2 90%    BMI 38.79 kg/m   General:  Obese,  well-appearing  HEENT:  Unremarkable   Neck:   no JVD, no bruits, no adenopathy   Chest:   clear to auscultation, symmetrical breath sounds, no wheezes, no rhonchi   CV:   RRR, no murmur   Abdomen:  soft, non-tender, no masses   Extremities:  warm, well-perfused, pulses not palpable, no LE edema  Rectal/GU  Deferred  Neuro:   Grossly non-focal and symmetrical throughout  Skin:   Clean and dry, no rashes, no breakdown    Diagnostic Tests:  CT ANGIOGRAPHY CHEST WITH CONTRAST  TECHNIQUE:  Multidetector CT imaging of the chest was performed using the  standard protocol during bolus administration of intravenous  contrast. Multiplanar CT image reconstructions and MIPs were  obtained to evaluate the vascular anatomy.  CONTRAST: 75mL ISOVUE-370 IOPAMIDOL (ISOVUE-370) INJECTION 76%  COMPARISON: Chest x-ray from earlier in the same day, CT of the  chest from 03/30/2018  FINDINGS:  Cardiovascular: Thoracic aorta demonstrates a normal branching  pattern. No aneurysmal dilatation or dissection is seen. Mild  cardiac enlargement is noted. Mitral valve repair is seen. The  pulmonary artery shows a normal branching pattern. No filling defect  to suggest pulmonary embolism is identified. No significant coronary  calcifications are seen.  Mediastinum/Nodes: The esophagus is within normal limits. The  thoracic inlet shows no acute abnormality. Multiple lymph nodes are  noted throughout the mediastinum particularly in  the superior and  anterior mediastinum which are consistent with the patient's given  clinical history of sarcoidosis. Some of these demonstrate diffuse  calcification. The overall bulk of the lymphadenopathy has increased  in the interval from the prior exam. Subcarinal lymphadenopathy is  also noted which has increased in the interval from the prior exam.  Lungs/Pleura: Diffuse ground-glass attenuation is noted throughout  the lungs bilaterally. Some increased parenchymal scarring is noted  in the left lower lobe when compared with the prior exam. Scattered  calcified granulomas are noted consistent with the given clinical  history. Diffuse septal thickening consistent with a degree of CHF  is noted. No focal confluent infiltrate is seen. No  sizable effusion  is noted.  Upper Abdomen: Visualized upper abdomen is within normal limits.  Musculoskeletal: Mild degenerative changes of the thoracic spine are  noted. No acute bony abnormality is seen.  Review of the MIP images confirms the above findings.  IMPRESSION:  Changes consistent with the given clinical history of sarcoidosis  with mediastinal adenopathy and evidence of granulomatous disease.  Additionally a portion of the changes in the lung are felt to be  related to the underlying sarcoidosis particularly in the lower  lobes bilaterally.  Diffuse ground-glass opacity as well as septal thickening consistent  with a degree of superimposed congestive failure.  No evidence of pulmonary embolism.  Electronically Signed  By: Inez Catalina M.D.  On: 11/06/2018 07:51  ECHOCARDIOGRAM REPORT  Patient Name: KARLIE AUNG Date of Exam: 03/04/2019  Medical Rec #: [id number] Height: 64.0 in  Accession #: [id number] Weight: 221.0 lb  Date of Birth: [birth year] BSA: 2.04 m  Patient Age: 89 years BP: 110/69 mmHg  Patient Gender: F HR: 86 bpm.  Exam Location: Inpatient  Procedure: 2D Echo  Indications: CHF 428  History: Patient has prior  history of Echocardiogram examinations, most  recent 09/10/2017. CHF. S/p MV repair. tobacco abuse. pulmonary  htn. MS. Mitral Valve Disease.  Sonographer: Jannett Celestine RDCS (AE)  Referring Phys: [place]  1. The left ventricle has normal systolic function with an ejection fraction of 60-65%. The cavity size was normal. Left ventricular diastolic Doppler parameters are consistent with pseudonormalization. There is right ventricular volume and pressure  overload. No evidence of left ventricular regional wall motion abnormalities.  2. The right ventricle has normal systolic function. The cavity was moderately enlarged. There is no increase in right ventricular wall thickness. Right ventricular systolic pressure is severely elevated with an estimated pressure of 88.1 mmHg.  3. Left atrial size was moderately dilated.  4. The mitral valve is myxomatous. Moderate thickening of the mitral valve leaflet. Severe mitral valve stenosis.  5. Post mitral valve repair, annuloplasty ring. Post repair mitral stenosis (mean gradient 8mmHg - severe).  6. Tricuspid valve regurgitation is moderate.  7. The aortic valve is tricuspid. Aortic valve regurgitation was not assessed by color flow Doppler.  8. The inferior vena cava was dilated in size with <50% respiratory variability.  FINDINGS  Left Ventricle: The left ventricle has normal systolic function, with an ejection fraction of 60-65%. The cavity size was normal. There is no increase in left ventricular wall thickness. Left ventricular diastolic Doppler parameters are consistent with  pseudonormalization. There is the interventricular septum is flattened in systole and diastole, consistent with right ventricular pressure and volume overload. No evidence of left ventricular regional wall motion abnormalities..  Right Ventricle: The right ventricle has normal systolic function. The cavity was moderately enlarged. There is no increase in right  ventricular wall thickness. Right ventricular systolic pressure is severely elevated with an estimated pressure of 88.1  mmHg.  Left Atrium: Left atrial size was moderately dilated.  Right Atrium: Right atrial size was normal in size. Right atrial pressure is estimated at 15 mmHg.  Interatrial Septum: No atrial level shunt detected by color flow Doppler.  Pericardium: There is no evidence of pericardial effusion.  Mitral Valve: The mitral valve is myxomatous. Moderate thickening of the mitral valve leaflet. Mitral valve regurgitation is trivial by color flow Doppler. Severe mitral valve stenosis. Post mitral valve repair, annuloplasty ring. Post repair mitral  stenosis (mean gradient 65mmHg - severe).  Tricuspid  Valve: The tricuspid valve is normal in structure. Tricuspid valve regurgitation is moderate by color flow Doppler.  Aortic Valve: The aortic valve is tricuspid Aortic valve regurgitation was not assessed by color flow Doppler. There is no evidence of aortic valve stenosis.  Pulmonic Valve: The pulmonic valve was normal in structure. Pulmonic valve regurgitation was not assessed by color flow Doppler.  Venous: The inferior vena cava is dilated in size with less than 50% respiratory variability.  +--------------+--------++   LEFT VENTRICLE     +--------------+---------++  +--------------+--------++  Diastology        PLAX 2D      +--------------+---------++  +--------------+--------++  LV e' lateral: 8.92 cm/s     LVIDd:  4.17 cm    +--------------+---------++  +--------------+--------++   LVIDs:  2.89 cm     +--------------+--------++   LV PW:  1.02 cm     +--------------+--------++   LV IVS:  0.91 cm     +--------------+--------++   LVOT diam:  1.90 cm     +--------------+--------++   LV SV:  45 ml     +--------------+--------++   LV SV Index:  20.83     +--------------+--------++   LVOT Area:  2.84 cm    +--------------+--------++          +--------------+--------++    +---------------+---------++   RIGHT VENTRICLE      +---------------+---------++   RV Mid diam:  4.51 cm     +---------------+---------++   RVSP:  88.1 mmHg    +---------------+---------++  +---------------+-------++-----------++   LEFT ATRIUM     Index     +---------------+-------++-----------++   LA diam:  4.60 cm  2.25 cm/m     +---------------+-------++-----------++   LA Vol (A2C):  50.8 ml  24.89 ml/m    +---------------+-------++-----------++   LA Vol (A4C):  33.6 ml  16.46 ml/m    +---------------+-------++-----------++   LA Biplane Vol: 43.9 ml  21.51 ml/m    +---------------+-------++-----------++  +------------+----------++-----------++   RIGHT ATRIUM    Index     +------------+----------++-----------++   RA Pressure: 15.00 mmHg       +------------+----------++-----------++   RA Area:  12.60 cm        +------------+----------++-----------++   RA Volume:  27.90 ml   13.67 ml/m    +------------+----------++-----------++  +------------+-----------++   AORTIC VALVE      +------------+-----------++   LVOT Vmax:  102.00 cm/s    +------------+-----------++   LVOT Vmean:  62.100 cm/s    +------------+-----------++   LVOT VTI:  0.183 m     +------------+-----------++  +-------------+-------++   AORTA       +-------------+-------++   Ao Root diam: 2.40 cm    +-------------+-------++  +-------------+----------++ +---------------+-----------++   MITRAL VALVE       TRICUSPID VALVE      +-------------+----------++ +---------------+-----------++   MV Peak grad: 19.1 mmHg     TR Peak grad:  73.1 mmHg     +-------------+----------++ +---------------+-----------++   MV Mean grad: 12.0 mmHg     TR Vmax:  453.00 cm/s    +-------------+----------++ +---------------+-----------++   MV Vmax:  2.18 m/s     Estimated RAP:  15.00 mmHg     +-------------+----------++ +---------------+-----------++   MV Vmean:  163.0 cm/s    RVSP:  88.1 mmHg     +-------------+----------++  +---------------+-----------++   MV VTI:  0.58 m     +-------------+----------++ +--------------+-------+   SHUNTS      +--------------+-------+   Systemic VTI:  0.18 m    +--------------+-------+  Systemic Diam: 1.90 cm   +--------------+-------+  Donato SchultzMark Skains MD  Electronically signed by Donato SchultzMark Skains MD  Signature Date/Time: 03/04/2019/12:47:37 PM  TRANSESOPHOGEAL ECHO REPORT  Patient Name: Janalee DaneLATASHA Y Hillock Date of Exam: 03/05/2019  Medical Rec #: [id number] Height: 64.0 in  Accession #: [id number] Weight: 213.6 lb  Date of Birth: 1981/01/14 BSA: 2.01 m  Patient Age: 54 years BP: 114/58 mmHg  Patient Gender: F HR: 71 bpm.  Exam Location: Inpatient  Procedure: Transesophageal Echo  Indications: Mitral stenosis  History: Patient has prior history of Echocardiogram examinations, most  recent 03/04/2019. CHF Pulmonary HTN MV repair and Mitral Valve  Disease Signs/Symptoms: Shortness of Breath Risk Factors:  Hypertension and Current Smoker.  Sonographer: Ross LudwigArthur Guy RDCS (AE)  Referring Phys: [id number] Jake BatheMARK C SKAINS  Diagnosing Phys: Dietrich PatesPaula Ross MD  PROCEDURE: The transesophogeal probe was passed through the esophogus of the patient. The patient developed no complications during the procedure.  IMPRESSIONS  1. The left ventricle has mildly reduced systolic function, with an ejection fraction of 45-50%. The cavity size was normal.  2. The right ventricle has mildly reduced systolic function. The cavity was mildly enlarged.  3. LA, LAA without masses.  4. S/P Mitral valve repair. 2D and 3D imaging done. The mitral leaflet is thickened with restricted motion, particularly the posterior leaflet. Peak and mean gradients through the valve are 13 and 8 mm Hg respectively. MVA by Pressure T1/2 is 1.85 cm2  consistent with mild MS 2D imaging suggests more moderate.  5. Tricuspid valve regurgitation is mild-moderate.  6. The aortic valve is tricuspid Aortic valve regurgitation is trivial by color flow  Doppler.  FINDINGS  Left Ventricle: The left ventricle has mildly reduced systolic function, with an ejection fraction of 45-50%. The cavity size was normal.  Right Ventricle: The right ventricle has mildly reduced systolic function. The cavity was mildly enlarged.  Left Atrium: LA, LAA without masses.  Mitral Valve: S/P Mitral valve repair. 2D and 3D imaging done. The mitral leaflet is thickened with restricted motion, particularly the posterior leaflet. Peak and mean gradients through the valve are 13 and 8 mm Hg respectively. MVA by Pressure T1/2 is  1.85 cm2 consistent with mild MS 2D imaging suggests more moderate.  Tricuspid Valve: The tricuspid valve was normal in structure. Tricuspid valve regurgitation is mild-moderate by color flow Doppler.  Aortic Valve: The aortic valve is tricuspid Aortic valve regurgitation is trivial by color flow Doppler.  +-------------+----------++   MITRAL VALVE       +-------------+----------++   MV Peak grad: 13.5 mmHg     +-------------+----------++   MV Mean grad: 8.0 mmHg     +-------------+----------++   MV Vmax:  1.84 m/s     +-------------+----------++   MV Vmean:  136.0 cm/s    +-------------+----------++   MV VTI:  0.46 m     +-------------+----------++  Dietrich PatesPaula Ross MD  Electronically signed by Dietrich PatesPaula Ross MD  Signature Date/Time: 03/05/2019/5:45:09 PM  RIGHT/LEFT HEART CATH AND CORONARY ANGIOGRAPHY  Conclusion  Prox RCA to Mid RCA lesion is 10% stenosed.  Prox Cx to Mid Cx lesion is 20% stenosed.  Mid LAD lesion is 10% stenosed. 1. Mild non-obstructive CAD   Recommendations  Antiplatelet/Anticoag No further ischemic workup. Proceed with evaluation for valve surgery.  Surgeon Notes    03/05/2019 2:28 PM CV Procedure signed by Pricilla Riffleoss, Paula V, MD  Indications  Severe mitral valve stenosis [I05.0 (ICD-10-CM)]  Procedural Details  Technical Details Indication: Severe Mitral stenosis  Procedure: The risks, benefits, complications, treatment  options, and expected outcomes were discussed with the patient. The patient and/or family concurred with the proposed plan, giving informed consent. The patient was brought to the cath lab after IV hydration was given. The patient was sedated with Versed and Fentanyl. The left wrist was prepped and draped in a sterile fashion. 1% lidocaine was used for local anesthesia. Using the modified Seldinger access technique, a 5 French sheath was placed in the left radial artery. 3 mg Verapamil was given through the sheath. 5000 units IV heparin was given. Standard diagnostic catheters were used to perform selective coronary angiography. A pigtail catheter was used to cross the aortic valve. LV pressures measured. No LV gram performed. The sheath was removed from the left radial artery and a Terumo hemostasis band was applied at the arteriotomy site on the left wrist.    Estimated blood loss <50 mL.   During this procedure medications were administered to achieve and maintain moderate conscious sedation while the patient's heart rate, blood pressure, and oxygen saturation were continuously monitored and I was present face-to-face 100% of this time.  Medications  (Filter: Administrations occurring from 03/05/19 1509 to 03/05/19 1656)           (important) Continuous medications are totaled by the amount administered until 03/05/19 1656.  Medication Rate/Dose/Volume Action  Date Time    Heparin (Porcine) in NaCl 1000-0.9 UT/500ML-% SOLN (mL) 500 mL Given 03/05/19 1512    Total dose as of 03/05/19 1656 500 mL Given 1512    1,000 mL         diphenhydrAMINE (BENADRYL) injection (mg) 50 mg Given 03/05/19 1513    Total dose as of 03/05/19 1656         50 mg         midazolam (VERSED) injection (mg) 1 mg Given 03/05/19 1521    Total dose as of 03/05/19 1656 1 mg Given 1538    2 mg         fentaNYL (SUBLIMAZE) injection (mcg) 25 mcg Given 03/05/19 1521    Total dose as of 03/05/19 1656 25 mcg Given 1539    50  mcg         lidocaine (PF) (XYLOCAINE) 1 % injection (mL) 2 mL Given 03/05/19 1528    Total dose as of 03/05/19 1656 2 mL Given 1533    14 mL 10 mL Given 1551    Radial Cocktail/Verapamil only (mL) 10 mL Canceled Entry 03/05/19 1533    Total dose as of 03/05/19 1656 10 mL Given 1612    10 mL         heparin injection (Units) 5,000 Units Given 03/05/19 1618    Total dose as of 03/05/19 1656         5,000 Units         iohexol (OMNIPAQUE) 350 MG/ML injection (mL) 55 mL Given 03/05/19 1637    Total dose as of 03/05/19 1656         55 mL         0.9 % sodium chloride infusion (mL/hr) 999 mL/hr Rate/Dose Change 03/05/19 1610    Dosing weight: 100.2 kg         Total dose as of 03/05/19 1656         778.39 mL         Sedation Time  Sedation Time Physician-1: 1 hour 10 minutes 59 seconds  Complications  Complications documented before study  signed (03/05/2019 4:57 PM)   RIGHT/LEFT HEART CATH AND CORONARY ANGIOGRAPHY   None Documented by Kathleene Hazel, MD 03/05/2019 4:38 PM  Date Found: 03/05/2019  Time Range: Intraprocedure    Coronary Findings  Diagnostic  Dominance: Right  Left Anterior Descending  Vessel is large.  Mid LAD lesion 10% stenosed  Mid LAD lesion is 10% stenosed.  Left Circumflex  Vessel is large.  Prox Cx to Mid Cx lesion 20% stenosed  Prox Cx to Mid Cx lesion is 20% stenosed.  Right Coronary Artery  Vessel is large.  Prox RCA to Mid RCA lesion 10% stenosed  Prox RCA to Mid RCA lesion is 10% stenosed.  Intervention  No interventions have been documented.  Coronary Diagrams  Diagnostic  Dominance: Right   Intervention  Implants     No implant documentation for this case.  Syngo Images  Link to Procedure Log   Show images for CARDIAC CATHETERIZATION Procedure Log  Images on Long Term Storage    Show images for Kaytlan, Behrman   Hemo Data   Most Recent Value  Fick Cardiac Output 6.17 L/min  Fick Cardiac Output Index 3.06 (L/min)/BSA  Aortic Mean  Gradient 6.4 mmHg  Aortic Peak Gradient 7 mmHg  Aortic Valve Area 2.59  Aortic Value Area Index 1.29 cm2/BSA  RA A Wave 9 mmHg  RA V Wave 9 mmHg  RA Mean 6 mmHg  RV Systolic Pressure 47 mmHg  RV Diastolic Pressure 2 mmHg  RV EDP 7 mmHg  PA Systolic Pressure 48 mmHg  PA Diastolic Pressure 20 mmHg  PA Mean 30 mmHg  PW A Wave 17 mmHg  PW V Wave 19 mmHg  PW Mean 16 mmHg  AO Systolic Pressure 98 mmHg  AO Diastolic Pressure 64 mmHg  AO Mean 79 mmHg  LV Systolic Pressure 99 mmHg  LV Diastolic Pressure 0 mmHg  LV EDP 6 mmHg  AOp Systolic Pressure 99 mmHg  AOp Diastolic Pressure 58 mmHg  AOp Mean Pressure 76 mmHg  LVp Systolic Pressure 106 mmHg  LVp Diastolic Pressure 1 mmHg  LVp EDP Pressure 6 mmHg  QP/QS 1  TPVR Index 9.79 HRUI  TSVR Index 25.76 HRUI  PVR SVR Ratio 0.19  TPVR/TSVR Ratio 0.38      Pulmonary Function Tests  Baseline     Post-bronchodilator  FVC 2.24 L (72% predicted)   FVC 2.39 L (77% predicted)  FEV1 2.10 L (81% predicted)   FEV1 2.22 L (86% predicted)  FEF25-75 4.07 L (141% predicted)  FEF25-75 5.07 L (176% predicted)  TLC 3.76 L (74% predicted)  RV 1.47 L (94% predicted)  DLCO 50% predicted      RIGHT HEART CATH  Conclusion  1. Mildly elevated PCWP.  2. Mild pulmonary venous hypertension, PVR 2.2 WU.  3. Preserved cardiac output.   There appears to be minimal fixed pulmonary hypertension, it does not appear that there is a significant PAH component from sarcoidosis. This would suggest that CHF is primarily related to mitral stenosis.   Surgeon Notes    03/05/2019 2:28 PM CV Procedure signed by Pricilla Riffle, MD  Procedural Details  Technical Details Procedure: Right Heart Cath  Indication: CHF, pulmonary hypertension   Procedural Details: The right brachial area was prepped, draped, and anesthetized with 1% lidocaine. There was a pre-existing peripheral IV that was replaced with a 56F venous sheath. A Swan-Ganz catheter was used for the  right heart catheterization. Standard protocol was followed for recording of right  heart pressures and sampling of oxygen saturations. Fick cardiac output was calculated. There were no immediate procedural complications. The patient was transferred to the post catheterization recovery area for further monitoring.  Estimated blood loss <50 mL.   During this procedure medications were administered to achieve and maintain moderate conscious sedation while the patient's heart rate, blood pressure, and oxygen saturation were continuously monitored and I was present face-to-face 100% of this time.  Medications (Filter: Administrations occurring from 03/27/19 1210 to 03/27/19 1307) Medication Rate/Dose/Volume Action  Date Time   midazolam (VERSED) injection (mg) 1 mg Given 03/27/19 1246   Total dose as of 03/27/19 1307        1 mg        fentaNYL (SUBLIMAZE) injection (mcg) 25 mcg Given 03/27/19 1246   Total dose as of 03/27/19 1307        25 mcg        lidocaine (PF) (XYLOCAINE) 1 % injection (mL) 2 mL Given 03/27/19 1248   Total dose as of 03/27/19 1307        2 mL        Heparin (Porcine) in NaCl 1000-0.9 UT/500ML-% SOLN (mL) 500 mL Given 03/27/19 1249   Total dose as of 03/27/19 1307        500 mL        albuterol (PROVENTIL) (2.5 MG/3ML) 0.083% nebulizer solution 2.5 mg (mg) *Not included in total MAR Hold 03/27/19 1210   Dosing weight:  101.2 kg        Total dose as of 03/27/19 1307        Cannot be calculated        budesonide (PULMICORT) nebulizer solution 0.5 mg (mg) *Not included in total MAR Hold 03/27/19 1210   Dosing weight:  104.1 kg        Total dose as of 03/27/19 1307        Cannot be calculated        busPIRone (BUSPAR) tablet 7.5 mg (mg) *Not included in total MAR Hold 03/27/19 1210   Dosing weight:  101.2 kg        Total dose as of 03/27/19 1307        Cannot be calculated        clonazePAM (KLONOPIN) tablet 0.5 mg (mg) *Not included [facility] 03/27/19 1210     Dosing weight:  101.2 kg        Total dose as of 03/27/19 1307        Cannot be calculated        enoxaparin (LOVENOX) injection 40 mg (mg) *Not included in total [facility] 03/27/19 1210   Dosing weight:  101.2 kg        Total dose as of 03/27/19 1307        Cannot be calculated        ferrous sulfate tablet 325 mg (mg) *Not included in total MAR Hold 03/27/19 1210   Dosing weight:  101.2 kg        Total dose as of 03/27/19 1307        Cannot be calculated        fluticasone (FLONASE) 50 MCG/ACT nasal spray 1 spray (spray) *Not included in total MAR Hold 03/27/19 1210   Dosing weight:  104.1 kg        Total dose as of 03/27/19 1307        Cannot be calculated        folic  acid (FOLVITE) tablet 0.5 mg (mg) *Not included in total [facility] Hold 03/27/19 1210   Dosing weight:  101.2 kg        Total dose as of 03/27/19 1307        Cannot be calculated        furosemide (LASIX) injection 60 mg (mg) *Not included in total [facility] Hold 03/27/19 1210   Dosing weight:  104.1 kg        Total dose as of 03/27/19 1307        Cannot be calculated        hydrOXYzine (VISTARIL) capsule 25 mg (mg) *Not included in total MAR Hold 03/27/19 1210   Dosing weight:  101.2 kg        Total dose as of 03/27/19 1307        Cannot be calculated        metoprolol succinate (TOPROL-XL) 24 hr tablet 25 mg (mg) *Not included in total MAR Hold 03/27/19 1210   Dosing weight:  104.1 kg        Total dose as of 03/27/19 1307        Cannot be calculated        patient's guide to using coumadin book *Not included in total [facility] Hold 03/27/19 1211   Dosing weight:  104.1 kg        Total dose as of 03/27/19 1307        Cannot be calculated        potassium chloride SA (K-DUR) CR tablet 40 mEq (mEq) *Not included in total [facility] Hold 03/27/19 1210   Dosing weight:  101.2 kg        Total dose as of 03/27/19 1307        Cannot be calculated        QUEtiapine (SEROQUEL) tablet 50 mg (mg) *Not included in total MAR Hold 03/27/19 1210    Dosing weight:  101.2 kg        Total dose as of 03/27/19 1307        Cannot be calculated        sertraline (ZOLOFT) tablet 100 mg (mg) *Not included in total [facility] Hold 03/27/19 1210   Dosing weight:  101.2 kg        Total dose as of 03/27/19 1307        Cannot be calculated        sodium chloride flush (NS) 0.9 % injection 3 mL (mL) *Not included in total [facility] Hold 03/27/19 1211   Dosing weight:  104.1 kg        Total dose as of 03/27/19 1307        Cannot be calculated        traZODone (DESYREL) tablet 25 mg (mg) *Not included in total MAR Hold 03/27/19 1210   Dosing weight:  101.2 kg        Total dose as of 03/27/19 1307        Cannot be calculated        Warfarin - Physician Dosing Inpatient *Not included in total [facility] Hold 03/27/19 1210   Dosing weight:  104.1 kg        Total dose as of 03/27/19 1307        Cannot be calculated        Sedation Time  Sedation Time Physician-1: 13 minutes 49 seconds  Right Heart  Right Heart Pressures RHC Procedural Findings: Hemodynamics (mmHg) RA mean 8 RV 50/10 PA 47/22,  mean 32 PCWP mean 17  Oxygen saturations: PA 69% AO 99%  Cardiac Output (Fick) 6.69  Cardiac Index (Fick) 3.23 PVR 2.24 WU  Implants   No implant documentation for this case.  Syngo Images  Show images for CARDIAC CATHETERIZATION  Images on Long Term Storage  Show images for Ardenia, Stiner to Procedure Log  Procedure Log    Hemo Data   Most Recent Value  Fick Cardiac Output 6.69 L/min  Fick Cardiac Output Index 3.23 (L/min)/BSA  RA A Wave 4 mmHg  RA V Wave 11 mmHg  RA Mean 8 mmHg  RV Systolic Pressure 50 mmHg  RV Diastolic Pressure 1 mmHg  RV EDP 10 mmHg  PA Systolic Pressure 47 mmHg  PA Diastolic Pressure 22 mmHg  PA Mean 32 mmHg  PW A Wave 18 mmHg  PW V Wave 21 mmHg  PW Mean 17 mmHg  QP/QS 1  TPVR Index 9.91 HRUI       Impression:  I have personally reviewed the patient's recent transthoracic and transesophageal  echocardiograms and diagnostic cardiac catheterization. She underwent elective mitral valve repair in 2014 and has at least moderate and possibly severe mitral stenosis with mild mitral regurgitation. She has suffered from symptoms of chronic diastolic congestive heart failure requiring numerous hospitalizations for acute exacerbation over the last several years. 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  Findings from her transthoracic and transesophageal echocardiograms suggest that her mitral valve disease is caused by inflammatory disease such as rheumatic heart disease and/or sarcoidosis. There is moderate thickening with mild to moderate restricted mobility of the anterior leaf of the mitral valve with severely restricted mobility of the posterior leaflet. There is an eccentric jet of regurgitation which courses anteriorly around the left atrium. Although the mitral regurgitation was not directly quantified, it appears moderate in severity. The annuloplasty ring appears intact. By planimetry the valve area is less than 1.5 cm and mean transvalvular gradients have ranged between 8 and 15 mmHg on previous echocardiograms. Patient also has significant right ventricular chamber enlargement with moderate tricuspid regurgitation. Options at this point include continued medical therapy versus redo mitral valve repair or replacement with or without tricuspid valve repair. Based upon review of the patient's TEE I am not very optimistic that her mitral valve could be repaired a second time and yield a satisfactory long-term result.   Given her relatively young age, surgical intervention would likely mandate mitral valve replacement using a mechanical prosthetic valve.     Plan:  The patient and her mother were counseled at length regarding the indications, risks and potential  benefits of redo mitral valve replacement with or without tricuspid valve repair.  The rationale for elective surgery has been explained, including a comparison between surgery and continued medical therapy with close follow-up.  We discussed the fact that her valvular heart disease is not imminently life-threatening, and continued long-term medical therapy remains a reasonable alternative.  We discussed the fact that her mitral valve would likely need to be replaced.  We discussed the possibility of replacing the mitral valve using a mechanical prosthesis with the attendant need for long-term anticoagulation versus the alternative of replacing it using a bioprosthetic tissue valve with its potential for late structural valve deterioration and failure.  Alternative surgical approaches have been discussed including a comparison between conventional sternotomy  and minimally-invasive techniques.  The relative risks and benefits of each have been reviewed as they pertain to the patient's specific circumstances, and expectations for the patient's postoperative convalescence has been discussed.  We discussed implications of long-term warfarin anticoagulation in the setting of a mechanical prosthetic valve in the mitral position.  We also discussed the impact of the likely diagnosis of pulmonary sarcoidosis on the patient's clinical condition and postoperative recovery.  At the time of surgery we will plan to attempt lymph node biopsy for definitive tissue diagnosis.  The patient and her mother understand and accept all potential risks of surgery including but not limited to risk of death, stroke or other neurologic complication, myocardial infarction, congestive heart failure, respiratory failure, renal failure, bleeding requiring transfusion and/or reexploration, arrhythmia, infection or other wound complications, pneumonia, pleural and/or pericardial effusion, pulmonary embolus, aortic dissection or other major  vascular complication, or delayed complications related to valve repair or replacement including but not limited to structural valve deterioration and failure, thrombosis, embolization, endocarditis, or paravalvular leak.  Specific risks potentially related to the minimally-invasive approach were discussed at length, including but not limited to risk of conversion to full or partial sternotomy, aortic dissection or other major vascular complication, unilateral acute lung injury or pulmonary edema, phrenic nerve dysfunction or paralysis, rib fracture, chronic pain, lung hernia, or lymphocele. All of their questions have been answered.      I spent in excess of 60 minutes during the conduct of this office consultation and >50% of this time involved direct face-to-face encounter with the patient for counseling and/or coordination of their care.    Salvatore Decent. Cornelius Moras, MD 04/03/2019 4:01 PM

## 2019-04-03 NOTE — Patient Instructions (Addendum)
Thank you for visiting Dr. Valeta Harms at [facility]. Today we recommend the following:  Please continue your albuterol and pulmicort nebulizer.  Good luck on your surgery.   Return in about 2 months (around 06/04/2019).    Please do your part to reduce the spread of COVID-19.

## 2019-04-03 NOTE — Progress Notes (Addendum)
Your procedure is scheduled on  Thursday, July 23, [facility] at 0530 A.M., and check in at the Admitting office.             Your surgery or procedure is scheduled for 7:30 AM  Call this number if you have problems the morning of surgery: [phone]  This is the number for the Pre- Surgical Desk.   Call [phone] if you have any questions prior to your surgery date Monday-Friday 8am-4pm   Remember:  Do not eat or drink after midnight the night before your surgery   Take these medicines the morning of surgery with A SIP OF WATER : busPIRone (BUSPAR) clonazePAM (KLONOPIN) docusate sodium (COLACE)  metoprolol succinate (TOPROL-XL) sertraline (ZOLOFT)   albuterol (PROVENTIL HFA;VENTOLIN HFA) 108 (90 Base) budesonide (PULMICORT)   Please bring all inhalers with you the day of surgery.   7 days prior to surgery STOP taking any Aspirin (unless otherwise instructed by your surgeon), Aleve, Naproxen, Ibuprofen, Motrin, Advil, Goody's, BC's, all herbal medications, fish oil, and all vitamins.   [place]- Preparing For Surgery  Before surgery, you can play an important role. Because skin is not sterile, your skin needs to be as free of germs as possible. You can reduce the number of germs on your skin by washing with CHG (chlorahexidine gluconate) Soap before surgery.  CHG is an antiseptic cleaner which kills germs and bonds with the skin to continue killing germs even after washing.    Oral Hygiene is also important to reduce your risk of infection.  Remember - BRUSH YOUR TEETH THE MORNING OF SURGERY WITH YOUR REGULAR TOOTHPASTE  Please do not use if you have an allergy to CHG or antibacterial soaps. If your skin becomes reddened/irritated stop using the CHG.  Do not shave (including legs and underarms) for at least 48 hours prior to first CHG shower. It is OK to shave your face.  Please follow these instructions carefully.   1. Shower the NIGHT  BEFORE SURGERY and the MORNING OF SURGERY with CHG Soap.   2. If you chose to wash your hair, wash your hair first as usual with your normal shampoo.  3. After you shampoo, wash your face and private area with the soap you use at home, then rinse your hair and body thoroughly to remove the shampoo and soap.  4. Use CHG as you would any other liquid soap. You can apply CHG directly to the skin and wash gently with a scrungie or a clean washcloth.   Apply the CHG Soap to your body ONLY FROM THE NECK DOWN.  Do not use on open wounds or open sores. Avoid contact with your eyes, ears, mouth and genitals (private parts).   5. Wash thoroughly, paying special attention to the area where your surgery will be performed.  6. Thoroughly rinse your body with warm water from the neck down.  7. DO NOT shower/wash with your normal soap after using and rinsing off the CHG Soap.  8. Pat yourself dry with a CLEAN TOWEL.  9. Wear CLEAN PAJAMAS to bed the night before surgery, wear comfortable clothes the morning of surgery  10. Place CLEAN SHEETS on your bed the night of your first shower and DO NOT SLEEP WITH PETS.  Day of Surgery: Shower as instructed above Do not wear lotions, powders, or perfumes/colognes, or deodorant. Please wear clean clothes to the hospital/surgery center.   Remember to brush your teeth  WITH YOUR REGULAR TOOTHPASTE.  Do not wear jewelry, make-up or nail polish.  Do not shave 48 hours prior to surgery.    Do not bring valuables to the hospital.  [facility] is not responsible for any belongings or valuables.  If you are a smoker, DO NOT Smoke 24 hours prior to surgery IF you wear a CPAP at night please bring your mask, tubing, and machine the morning of surgery   Remember that you must have someone to transport you home after your surgery, and remain with you for 24 hours if you are discharged the same day.  Contacts, glasses, hearing aids, dentures or bridgework may not be  worn into surgery.   Leave your suitcase in the car.  After surgery it may be brought to your room.  For patients admitted to the hospital, discharge time will be determined by your treatment team.  Patients discharged the day of surgery will not be allowed to drive home. Please read over the following fact sheets that you were given.

## 2019-04-03 NOTE — Progress Notes (Signed)
° °   °[address] °      [place] [address] °            [phone]   ° ° °CARDIOTHORACIC SURGERY OFFICE NOTE ° °Referring Provider is Crawford, Theresa C, MD °Primary Cardiologist is Crawford, Suresh, MD °Advanced Heart Failure Cardiologist is Crawford, Theresa S, MD °Primary Pulmonologist is Crawford, Theresa L, MD °PCP is Crawford, Theresa P, NP ° °Chief Complaint  °Patient presents with  °• Mitral Stenosis  °  Further discuss surgery scheduled for 04/05/19  °• Tricuspid Regurgitation  ° ° °HPI: ° °Patient is [name] with long standing history of mitral valve disease and chronic diastolic congestive heart failure status post mitral valve repair in 2014 who returns to the office today with tentative plans to proceed with redo mitral valve replacement and tricuspid valve repair later this week. °  °Patient states that her heart disease began approximately 10 years ago when she was pregnant with her second child.  She states that she developed pericarditis and congestive heart failure.  She was eventually diagnosed with mitral valve disease and sarcoidosis.  She underwent mitral valve repair in 2014.  At the time she lived in [place].  By report her surgical repair included "resuspension of the posterior leaflet" and placement of a complete, semi-rigid CE Physio annuloplasty ring, size 30 mm.  Following surgery she states that she quickly began to experience worsening problems of congestive heart failure.  Within a year following surgery she had been hospitalized on several occasions with acute exacerbation of chronic diastolic congestive heart failure.  She was referred back to her cardiac surgeon and the possibility of redo surgery was discussed.  She moved to [place] in January 2018.  She has been followed intermittently since then by Dr. Koneswaran, and she has been hospitalized on numerous occasions with acute exacerbation of chronic diastolic  congestive heart failure and pulmonary edema.  Follow-up echocardiograms have documented the presence of normal left ventricular systolic function with at least mild mitral regurgitation and increased transvalvular gradients consistent with moderate to severe mitral stenosis.  I had the opportunity to see her in consultation on 03/20/2018 and we discussed the indications, risks and potential benefits of redo mitral valve repair or replacement with possible tricuspid valve repair.  Continued medical therapy was discussed as an alternative, particularly given the likelihood that her mitral valve would need to be replaced at the time of surgery.  She expressed interest in proceeding with further diagnostic testing and surgery and was referred for dental clearance and diagnostic cardiac catheterization.  She underwent dental extraction and was scheduled for catheterization, but she cancelled at the last minute.  By report she had a "breakdown" and [facility] with suicidal ideation.  Ultimately the patient's psychiatric problems were brought under improved control. ° °Over the past 6 months the patient has had 3 different hospitalizations for acute exacerbation of shortness of breath.  1 month ago she was hospitalized with severe resting shortness of breath and hypoxemic respiratory failure with pulmonary edema on chest x-ray.  I had the opportunity to see her in follow-up at that time.  She was also evaluated by Dr. Icard from pulmonary medicine regarding likely diagnosis of sarcoidosis.  She did well with diuretic therapy and was discharged from the hospital with plans to proceed with elective surgical intervention this month.  However, the patient was readmitted to the hospital again March 24, 2019 with another acute exacerbation of resting shortness of breath with increased   cough.  She was evaluated by the advanced heart failure team and underwent repeat right heart  catheterization by Dr. McLean.  She was also seen by the pharmacy team and counseled regarding what it would be like for her to be treated using warfarin for long-term anticoagulation.  Plans were made to proceed with elective redo mitral valve replacement and tricuspid valve repair later this week.  She returns to the office today with her mother present for follow-up consultation.  She reports no new problems or complaints.  Her breathing has been stable since hospital discharge.  Her weight is been stable.  She still has an intermittent dry nonproductive cough.  She denies fevers or chills.  She has been practicing social distancing and wearing a facemask whenever out of the house. ° °Patient is single and lives with her mother in [place].  She is unemployed.  She has 2 children that do not live with her, ages 15 and 10.  She describes chronic symptoms of exertional shortness of breath, orthopnea, lower extremity edema, and a chronic cough which have waxed and waned in severity but she states began immediately after her surgery in 2014.  She has been hospitalized on numerous occasions for acute exacerbations of chronic congestive heart failure and symptoms always improve following intravenous diuresis.  She states that recently at home she has been taking Bumex regularly but despite this she has problems with fluid retention.  She has chronic cough with occasional episodes of frank mopped assist.  She reports some occasional tightness across her chest which is typically transient and fleeting and not necessarily associated with shortness of breath.  Tightness in her chest is not related to physical exertion.  She admits to problems with chronic anxiety including the fact that she was recently hospitalized at [facility] because of extreme anxiety with suicidal ideation.  She states that she quit smoking approximately 1 month ago and she denies any illicit drug use. ° ° ° °Past Medical History:    °Diagnosis Date  °• Anxiety   °• Arthritis   ° "lower back" (04/04/2018)  °• Bipolar disorder (HCC)   °• Chronic bronchitis (HCC)   °• Chronic diastolic CHF (congestive heart failure) (HCC)   °• Chronic lower back pain   °• DDD (degenerative disc disease), lumbar   °• Depression   °• Dyspnea   °• GERD (gastroesophageal reflux disease)   °• Headache   ° "weekly" (04/04/2018), miragrains in past (04/03/2019)  °• Heart murmur   °• Hypertension   °• Mitral valve disease   °• Normocytic anemia   °• Obesity   °• Palpitations   °• Pericarditis   °• Pneumonia   °• Premature atrial contractions   °• Pulmonary hypertension (HCC)   °• PVC's (premature ventricular contractions)   ° a. h/o palpitations with event monitor in 03/2017 showing NSR with PACs/PVCs.  °• Recurrent mitral valve stenosis and regurgitation s/p mitral valve repair   °• S/P mitral valve repair 03/27/2013  ° Dr. Mark Burlingame - [place], [place] - complex valvuloplasty including resuspension of entire posterior leaflet using Gore-tex neochords and 30 mm Crawford Physio ring annuloplasty  °• Sarcoidosis   °• Tobacco abuse   °• Tricuspid regurgitation   ° ° °Past Surgical History:  °Procedure Laterality Date  °• ABDOMINAL HERNIA REPAIR  ~ 2011  °• CARDIAC CATHETERIZATION  2014  °• CESAREAN SECTION  2004; 2009  °• HERNIA REPAIR    °• MITRAL VALVE REPAIR  03/27/2013  ° Dr.   Mark Burlingame - [place], [place]. - complex valvuloplasty including resuspension of posterior leaflet with 30 mm CE Physio ring annuloplasty  °• MULTIPLE EXTRACTIONS WITH ALVEOLOPLASTY N/A 04/03/2018  ° Procedure: Extraction of tooth #30 with alveoloplasty and gross debridement of remaining teeth;  Surgeon: Kulinski, Ronald F, DDS;  Location: [facility] OR;  Service: Oral Surgery;  Laterality: N/A;  °• PARTIAL HYSTERECTOMY  2011  ° PID w/problem with fallopian tubes, had left fallopian and left ovary removed, pt still having periods  °• RIGHT HEART CATH N/A 03/27/2019  ° Procedure: RIGHT HEART CATH;  Surgeon:  Crawford, Theresa S, MD;  Location: [facility] INVASIVE CV LAB;  Service: Cardiovascular;  Laterality: N/A;  °• RIGHT/LEFT HEART CATH AND CORONARY ANGIOGRAPHY N/A 03/05/2019  ° Procedure: RIGHT/LEFT HEART CATH AND CORONARY ANGIOGRAPHY;  Surgeon: McAlhany, Christopher D, MD;  Location: [facility];  Service: Cardiovascular;  Laterality: N/A;  °• TEE WITHOUT CARDIOVERSION N/A 05/26/2016  ° Procedure: TRANSESOPHAGEAL ECHOCARDIOGRAM (TEE);  Surgeon: Theresa A Koneswaran, MD;  Location: [facility];  Service: Cardiovascular;  Laterality: N/A;  °• TEE WITHOUT CARDIOVERSION N/A 01/25/2018  ° Procedure: TRANSESOPHAGEAL ECHOCARDIOGRAM (TEE) WITH PROPOFOL;  Surgeon: McDowell, Samuel G, MD;  Location: [facility];  Service: Cardiovascular;  Laterality: N/A;  °• TEE WITHOUT CARDIOVERSION N/A 03/05/2019  ° Procedure: TRANSESOPHAGEAL ECHOCARDIOGRAM (TEE);  Surgeon: Ross, Paula V, MD;  Location: [facility] END[facility];  Service: Cardiovascular;  Laterality: N/A;  ° ° °Family History  °Problem Relation Age of Onset  °• Heart failure Father   °     just received LVAD  °• Heart disease Paternal Grandfather   °     stent placement  ° ° °Social History  ° °Socioeconomic History  °• Marital status: Single  °  Spouse name: Not on file  °• Number of children: 2  °• Years of education: Not on file  °• Highest education level: Not on file  °Occupational History  °• Not on file  °Social Needs  °• Financial resource strain: Not on file  °• Food insecurity  °  Worry: Not on file  °  Inability: Not on file  °• Transportation needs  °  Medical: Not on file  °  Non-medical: Not on file  °Tobacco Use  °• Smoking status: Current Some Day Smoker  °  Packs/day: 0.25  °  Years: 24.00  °  Pack years: 6.00  °  Types: Cigarettes  °  Start date: 11/03/1999  °• Smokeless tobacco: Never Used  °Substance and Sexual Activity  °• Alcohol use: Not Currently  °  Comment: 04/04/2018 "1-2 beers/month; if that"  °• Drug use: Not Currently  °• Sexual activity: Not Currently    °Lifestyle  °• Physical activity  °  Days per week: Not on file  °  Minutes per session: Not on file  °• Stress: Not on file  °Relationships  °• Social connections  °  Talks on phone: Not on file  °  Gets together: Not on file  °  Attends religious service: Not on file  °  Active member of club or organization: Not on file  °  Attends meetings of clubs or organizations: Not on file  °  Relationship status: Not on file  °• Intimate partner violence  °  Fear of current or ex partner: Not on file  °  Emotionally abused: Not on file  °  Physically abused: Not on file  °  Forced sexual activity: Not on file  °Other   Topics Concern  °• Not on file  °Social History Narrative  °• Not on file  ° ° °Current Outpatient Medications  °Medication Sig Dispense Refill  °• albuterol (PROVENTIL HFA;VENTOLIN HFA) 108 (90 Base) MCG/ACT inhaler Inhale 2 puffs into the lungs every 6 (six) hours as needed for wheezing or shortness of breath.    °• budesonide (PULMICORT) 180 MCG/ACT inhaler Inhale 1 puff into the lungs 2 (two) times a day. 1 each 3  °• busPIRone (BUSPAR) 7.5 MG tablet Take 1 tablet (7.5 mg total) by mouth 3 (three) times daily. 90 tablet 1  °• clonazePAM (KLONOPIN) 0.5 MG tablet Take 1 tablet (0.5 mg total) by mouth 2 (two) times daily. 60 tablet 0  °• docusate sodium (COLACE) 100 MG capsule Take 100 mg by mouth daily as needed for moderate constipation.     °• EPINEPHrine (EPIPEN 2-PAK) 0.3 mg/0.3 mL IJ SOAJ injection Inject 0.3 Units into the muscle once.     °• ferrous sulfate 325 (65 FE) MG tablet Take 1 tablet (325 mg total) by mouth 2 (two) times daily with a meal. (Patient taking differently: Take 325 mg by mouth daily with breakfast. )  3  °• folic acid (FOLVITE) 800 MCG tablet Take 400 mcg by mouth daily.    °• hydrOXYzine (VISTARIL) 25 MG capsule Take 1 capsule (25 mg total) by mouth 3 (three) times daily as needed. 30 capsule 3  °• metoprolol succinate (TOPROL-XL) 25 MG 24 hr tablet Take 1 tablet (25 mg total)  by mouth 2 (two) times a day. 60 tablet 3  °• potassium chloride SA (K-DUR) 20 MEQ tablet Take 2 tablets (40 mEq total) by mouth daily. Take 2 tablets daily 60 tablet 3  °• Probiotic Product (PROBIOTIC PO) Take 1 capsule by mouth daily.    °• QUEtiapine (SEROQUEL) 50 MG tablet Take 1 tablet (50 mg total) by mouth at bedtime. 30 tablet 0  °• sertraline (ZOLOFT) 100 MG tablet Take 1 tablet (100 mg total) by mouth daily. 30 tablet 3  °• torsemide (DEMADEX) 20 MG tablet Take 2 tablets (40 mg total) by mouth daily. 30 tablet 3  °• traZODone (DESYREL) 50 MG tablet Take 0.5 tablets (25 mg total) by mouth at bedtime. 30 tablet 3  °• vitamin B-12 (CYANOCOBALAMIN) 1000 MCG tablet Take 1,000 mcg by mouth daily.    ° °No current facility-administered medications for this visit.   ° ° °Allergies  °Allergen Reactions  °• Bee Venom Anaphylaxis  °• Lisinopril Anaphylaxis, Shortness Of Breath, Swelling and Other (See Comments)  °  Throat swells  °• Penicillins Shortness Of Breath, Swelling and Other (See Comments)  °  Has patient had a PCN reaction causing immediate rash, facial/tongue/throat swelling, SOB or lightheadedness with hypotension: Yes °Has patient had a PCN reaction causing severe rash involving mucus membranes or skin necrosis: Yes °Has patient had a PCN reaction that required hospitalization No °Has patient had a PCN reaction occurring within the last 10 years: No °If all of the above answers are "NO", then may proceed with Cephalosporin use. °  °• Perflutren Lipid Microsphere Shortness Of Breath and Other (See Comments)  °  Chest Pain/Tightness, also  °• Shellfish Allergy Anaphylaxis  °• Contrast Media [Iodinated Diagnostic Agents] Hives, Itching and Other (See Comments)  °  Itching and hives to throat, neck, face and arms.   No difficulty breathing.   This was given at [facility] approximately 2015. Contrast Dye  ° ° °  °Review of   Systems: °  °            General:                      normal appetite,  decreased energy, + weight gain, + weight loss, no fever °            Cardiac:                       no chest pain with exertion, + occasional fleeting chest pain at rest, +SOB with exertion, + resting SOB, no PND, + orthopnea, + palpitations, no arrhythmia, no atrial fibrillation, + LE edema, no dizzy spells, no syncope °            Respiratory:                 + shortness of breath, no home oxygen, no productive cough, + chronic dry cough, no bronchitis, + wheezing, occasional hemoptysis, no asthma, no pain with inspiration or cough, no sleep apnea, no CPAP at night °            GI:                               no difficulty swallowing, no reflux, no frequent heartburn, no hiatal hernia, no abdominal pain, no constipation, no diarrhea, no hematochezia, no hematemesis, no melena °            GU:                              no dysuria,  no frequency, no urinary tract infection, no hematuria, no kidney stones, no kidney disease °            Vascular:                     no pain suggestive of claudication, no pain in feet, no leg cramps, no varicose veins, no DVT, no non-healing foot ulcer °            Neuro:                         no stroke, no TIA's, no seizures, no headaches, + temporary blindness one eye typically on left side,  no slurred speech, no peripheral neuropathy, no chronic pain, no instability of gait, no memory/cognitive dysfunction °            Musculoskeletal:         no arthritis, no joint swelling, no myalgias, no difficulty walking, normal mobility  °            Skin:                            no rash, no itching, no skin infections, no pressure sores or ulcerations °            Psych:                         + anxiety, + depression, + nervousness, no unusual recent stress °            Eyes:                           +   blurry vision, no floaters, + recent vision changes, + wears glasses or contacts °            ENT:                            no hearing loss, no loose or painful teeth, no  dentures, last saw dentist within the past year °            Hematologic:               no easy bruising, no abnormal bleeding, no clotting disorder, no frequent epistaxis °            Endocrine:                   no diabetes, does not check CBG's at home °  °                         °   °Physical Exam: ° ° BP 116/72    Pulse 100    Temp 97.8 °F (36.6 °C) (Skin)    Resp 20    Ht 5' 4" (1.626 m)    Wt 226 lb (102.5 kg)    LMP 03/29/2019    SpO2 90%    BMI 38.79 kg/m²  ° General:  Obese,  well-appearing ° HEENT:  Unremarkable  ° Neck:   no JVD, no bruits, no adenopathy  ° Chest:   clear to auscultation, symmetrical breath sounds, no wheezes, no rhonchi  ° CV:   RRR, no murmur  ° Abdomen:  soft, non-tender, no masses  ° Extremities:  warm, well-perfused, pulses not palpable, no LE edema ° Rectal/GU  Deferred ° Neuro:   Grossly non-focal and symmetrical throughout ° Skin:   Clean and dry, no rashes, no breakdown ° ° ° °Diagnostic Tests: ° °CT ANGIOGRAPHY CHEST WITH CONTRAST  °TECHNIQUE:  °Multidetector CT imaging of the chest was performed using the  °standard protocol during bolus administration of intravenous  °contrast. Multiplanar CT image reconstructions and MIPs were  °obtained to evaluate the vascular anatomy.  °CONTRAST: 75mL ISOVUE-370 IOPAMIDOL (ISOVUE-370) INJECTION 76%  °COMPARISON: Chest x-ray from earlier in the same day, CT of the  °chest from 03/30/2018  °FINDINGS:  °Cardiovascular: Thoracic aorta demonstrates a normal branching  °pattern. No aneurysmal dilatation or dissection is seen. Mild  °cardiac enlargement is noted. Mitral valve repair is seen. The  °pulmonary artery shows a normal branching pattern. No filling defect  °to suggest pulmonary embolism is identified. No significant coronary  °calcifications are seen.  °Mediastinum/Nodes: The esophagus is within normal limits. The  °thoracic inlet shows no acute abnormality. Multiple lymph nodes are  °noted throughout the mediastinum particularly in  the superior and  °anterior mediastinum which are consistent with the patient's given  °clinical history of sarcoidosis. Some of these demonstrate diffuse  °calcification. The overall bulk of the lymphadenopathy has increased  °in the interval from the prior exam. Subcarinal lymphadenopathy is  °also noted which has increased in the interval from the prior exam.  °Lungs/Pleura: Diffuse ground-glass attenuation is noted throughout  °the lungs bilaterally. Some increased parenchymal scarring is noted  °in the left lower lobe when compared with the prior exam. Scattered  °calcified granulomas are noted consistent with the given clinical  °history. Diffuse septal thickening consistent with a degree of CHF  °is noted. No focal confluent infiltrate is seen. No   sizable effusion  °is noted.  °Upper Abdomen: Visualized upper abdomen is within normal limits.  °Musculoskeletal: Mild degenerative changes of the thoracic spine are  °noted. No acute bony abnormality is seen.  °Review of the MIP images confirms the above findings.  °IMPRESSION:  °Changes consistent with the given clinical history of sarcoidosis  °with mediastinal adenopathy and evidence of granulomatous disease.  °Additionally a portion of the changes in the lung are felt to be  °related to the underlying sarcoidosis particularly in the lower  °lobes bilaterally.  °Diffuse ground-glass opacity as well as septal thickening consistent  °with a degree of superimposed congestive failure.  °No evidence of pulmonary embolism.  °Electronically Signed  °By: Theresa Lukens M.D.  °On: 11/06/2018 07:51  °ECHOCARDIOGRAM REPORT  °Patient Name: Theresa Crawford Date of Exam: 03/04/2019  °Medical Rec #: [id number] Height: 64.0 in  °Accession #: [id number] Weight: 221.0 lb  °Date of Birth: 06/08/1981 BSA: 2.04 m²  °Patient Age: 38 years BP: 110/69 mmHg  °Patient Gender: F HR: 86 bpm.  °Exam Location: Inpatient  °Procedure: 2D Echo  °Indications: CHF 428  °History: Patient has prior  history of Echocardiogram examinations, most  °recent 09/10/2017. CHF. S/p MV repair. tobacco abuse. pulmonary  °htn. MS. Mitral Valve Disease.  °Sonographer: Vijay Shankar RDCS (AE)  °Referring Phys: [id number] JAMES KIM  °IMPRESSIONS  °1. The left ventricle has normal systolic function with an ejection fraction of 60-65%. The cavity size was normal. Left ventricular diastolic Doppler parameters are consistent with pseudonormalization. There is right ventricular volume and pressure  °overload. No evidence of left ventricular regional wall motion abnormalities.  °2. The right ventricle has normal systolic function. The cavity was moderately enlarged. There is no increase in right ventricular wall thickness. Right ventricular systolic pressure is severely elevated with an estimated pressure of 88.1 mmHg.  °3. Left atrial size was moderately dilated.  °4. The mitral valve is myxomatous. Moderate thickening of the mitral valve leaflet. Severe mitral valve stenosis.  °5. Post mitral valve repair, annuloplasty ring. Post repair mitral stenosis (mean gradient 13mmHg - severe).  °6. Tricuspid valve regurgitation is moderate.  °7. The aortic valve is tricuspid. Aortic valve regurgitation was not assessed by color flow Doppler.  °8. The inferior vena cava was dilated in size with <50% respiratory variability.  °FINDINGS  °Left Ventricle: The left ventricle has normal systolic function, with an ejection fraction of 60-65%. The cavity size was normal. There is no increase in left ventricular wall thickness. Left ventricular diastolic Doppler parameters are consistent with  °pseudonormalization. There is the interventricular septum is flattened in systole and diastole, consistent with right ventricular pressure and volume overload. No evidence of left ventricular regional wall motion abnormalities..  °Right Ventricle: The right ventricle has normal systolic function. The cavity was moderately enlarged. There is no increase in right  ventricular wall thickness. Right ventricular systolic pressure is severely elevated with an estimated pressure of 88.1  °mmHg.  °Left Atrium: Left atrial size was moderately dilated.  °Right Atrium: Right atrial size was normal in size. Right atrial pressure is estimated at 15 mmHg.  °Interatrial Septum: No atrial level shunt detected by color flow Doppler.  °Pericardium: There is no evidence of pericardial effusion.  °Mitral Valve: The mitral valve is myxomatous. Moderate thickening of the mitral valve leaflet. Mitral valve regurgitation is trivial by color flow Doppler. Severe mitral valve stenosis. Post mitral valve repair, annuloplasty ring. Post repair mitral  °stenosis (mean gradient 13mmHg - severe).  °Tricuspid   Valve: The tricuspid valve is normal in structure. Tricuspid valve regurgitation is moderate by color flow Doppler.  °Aortic Valve: The aortic valve is tricuspid Aortic valve regurgitation was not assessed by color flow Doppler. There is no evidence of aortic valve stenosis.  °Pulmonic Valve: The pulmonic valve was normal in structure. Pulmonic valve regurgitation was not assessed by color flow Doppler.  °Venous: The inferior vena cava is dilated in size with less than 50% respiratory variability.  °+--------------+--------++  ° LEFT VENTRICLE     +--------------+---------++  °+--------------+--------++  Diastology       ° PLAX 2D      +--------------+---------++  °+--------------+--------++  LV e' lateral: 8.92 cm/s    ° LVIDd:  4.17 cm    +--------------+---------++  °+--------------+--------++  ° LVIDs:  2.89 cm     °+--------------+--------++  ° LV PW:  1.02 cm     °+--------------+--------++  ° LV IVS:  0.91 cm     °+--------------+--------++  ° LVOT diam:  1.90 cm     °+--------------+--------++  ° LV SV:  45 ml     °+--------------+--------++  ° LV SV Index:  20.83     °+--------------+--------++  ° LVOT Area:  2.84 cm²    °+--------------+--------++  °        °+--------------+--------++    °+---------------+---------++  ° RIGHT VENTRICLE      °+---------------+---------++  ° RV Mid diam:  4.51 cm     °+---------------+---------++  ° RVSP:  88.1 mmHg    °+---------------+---------++  °+---------------+-------++-----------++  ° LEFT ATRIUM     Index     °+---------------+-------++-----------++  ° LA diam:  4.60 cm  2.25 cm/m²     °+---------------+-------++-----------++  ° LA Vol (A2C):  50.8 ml  24.89 ml/m²    °+---------------+-------++-----------++  ° LA Vol (A4C):  33.6 ml  16.46 ml/m²    °+---------------+-------++-----------++  ° LA Biplane Vol: 43.9 ml  21.51 ml/m²    °+---------------+-------++-----------++  °+------------+----------++-----------++  ° RIGHT ATRIUM    Index     °+------------+----------++-----------++  ° RA Pressure: 15.00 mmHg       °+------------+----------++-----------++  ° RA Area:  12.60 cm²        °+------------+----------++-----------++  ° RA Volume:  27.90 ml   13.67 ml/m²    °+------------+----------++-----------++  °+------------+-----------++  ° AORTIC VALVE      °+------------+-----------++  ° LVOT Vmax:  102.00 cm/s    °+------------+-----------++  ° LVOT Vmean:  62.100 cm/s    °+------------+-----------++  ° LVOT VTI:  0.183 m     °+------------+-----------++  °+-------------+-------++  ° AORTA       °+-------------+-------++  ° Ao Root diam: 2.40 cm    °+-------------+-------++  °+-------------+----------++ +---------------+-----------++  ° MITRAL VALVE       TRICUSPID VALVE      °+-------------+----------++ +---------------+-----------++  ° MV Peak grad: 19.1 mmHg     TR Peak grad:  73.1 mmHg     °+-------------+----------++ +---------------+-----------++  ° MV Mean grad: 12.0 mmHg     TR Vmax:  453.00 cm/s    °+-------------+----------++ +---------------+-----------++  ° MV Vmax:  2.18 m/s     Estimated RAP:  15.00 mmHg     °+-------------+----------++ +---------------+-----------++  ° MV Vmean:  163.0 cm/s    RVSP:  88.1 mmHg     °+-------------+----------++  +---------------+-----------++  ° MV VTI:  0.58 m     °+-------------+----------++ +--------------+-------+  ° SHUNTS      °+--------------+-------+  ° Systemic VTI:  0.18 m    °+--------------+-------+  °   Systemic Diam: 1.90 cm   °+--------------+-------+  °Theresa Skains MD  °Electronically signed by Theresa Skains MD  °Signature Date/Time: 03/04/2019/12:47:37 PM  °TRANSESOPHOGEAL ECHO REPORT  °Patient Name: Theresa Crawford Date of Exam: 03/05/2019  °Medical Rec #: [id number] Height: 64.0 in  °Accession #: [id number] Weight: 213.6 lb  °Date of Birth: 01/24/1981 BSA: 2.01 m²  °Patient Age: 38 years BP: 114/58 mmHg  °Patient Gender: F HR: 71 bpm.  °Exam Location: Inpatient  °Procedure: Transesophageal Echo  °Indications: Mitral stenosis  °History: Patient has prior history of Echocardiogram examinations, most  °recent 03/04/2019. CHF Pulmonary HTN MV repair and Mitral Valve  °Disease Signs/Symptoms: Shortness of Breath Risk Factors:  °Hypertension and Current Smoker.  °Sonographer: Arthur Guy RDCS (AE)  °Referring Phys: [id number] Theresa Crawford  °Diagnosing Phys: Paula Ross MD  °PROCEDURE: The transesophogeal probe was passed through the esophogus of the patient. The patient developed no complications during the procedure.  °IMPRESSIONS  °1. The left ventricle has mildly reduced systolic function, with an ejection fraction of 45-50%. The cavity size was normal.  °2. The right ventricle has mildly reduced systolic function. The cavity was mildly enlarged.  °3. LA, LAA without masses.  °4. S/P Mitral valve repair. 2D and 3D imaging done. The mitral leaflet is thickened with restricted motion, particularly the posterior leaflet. Peak and mean gradients through the valve are 13 and 8 mm Hg respectively. MVA by Pressure T1/2 is 1.85 cm2  °consistent with mild MS 2D imaging suggests more moderate.  °5. Tricuspid valve regurgitation is mild-moderate.  °6. The aortic valve is tricuspid Aortic valve regurgitation is trivial by color flow  Doppler.  °FINDINGS  °Left Ventricle: The left ventricle has mildly reduced systolic function, with an ejection fraction of 45-50%. The cavity size was normal.  °Right Ventricle: The right ventricle has mildly reduced systolic function. The cavity was mildly enlarged.  °Left Atrium: LA, LAA without masses.  °Mitral Valve: S/P Mitral valve repair. 2D and 3D imaging done. The mitral leaflet is thickened with restricted motion, particularly the posterior leaflet. Peak and mean gradients through the valve are 13 and 8 mm Hg respectively. MVA by Pressure T1/2 is  °1.85 cm2 consistent with mild MS 2D imaging suggests more moderate.  °Tricuspid Valve: The tricuspid valve was normal in structure. Tricuspid valve regurgitation is mild-moderate by color flow Doppler.  °Aortic Valve: The aortic valve is tricuspid Aortic valve regurgitation is trivial by color flow Doppler.  °+-------------+----------++  ° MITRAL VALVE       °+-------------+----------++  ° MV Peak grad: 13.5 mmHg     °+-------------+----------++  ° MV Mean grad: 8.0 mmHg     °+-------------+----------++  ° MV Vmax:  1.84 m/s     °+-------------+----------++  ° MV Vmean:  136.0 cm/s    °+-------------+----------++  ° MV VTI:  0.46 m     °+-------------+----------++  °Paula Ross MD  °Electronically signed by Paula Ross MD  °Signature Date/Time: 03/05/2019/5:45:09 PM  °RIGHT/LEFT HEART CATH AND CORONARY ANGIOGRAPHY  °Conclusion  °Prox RCA to Mid RCA lesion is 10% stenosed.  °Prox Cx to Mid Cx lesion is 20% stenosed.  °Mid LAD lesion is 10% stenosed. °1. Mild non-obstructive CAD   °Recommendations  °Antiplatelet/Anticoag No further ischemic workup. Proceed with evaluation for valve surgery.  °Surgeon Notes  °  03/05/2019 2:28 PM CV Procedure signed by Ross, Paula V, MD  °Indications  °Severe mitral valve stenosis [I05.0 (ICD-10-CM)]  °Procedural Details  °Technical Details Indication: Severe Mitral stenosis ° °  Procedure: The risks, benefits, complications, treatment  options, and expected outcomes were discussed with the patient. The patient and/or family concurred with the proposed plan, giving informed consent. The patient was brought to the cath lab after IV hydration was given. The patient was sedated with Versed and Fentanyl. The left wrist was prepped and draped in a sterile fashion. 1% lidocaine was used for local anesthesia. Using the modified Seldinger access technique, a 5 French sheath was placed in the left radial artery. 3 mg Verapamil was given through the sheath. 5000 units IV heparin was given. Standard diagnostic catheters were used to perform selective coronary angiography. A pigtail catheter was used to cross the aortic valve. LV pressures measured. No LV gram performed. The sheath was removed from the left radial artery and a Terumo hemostasis band was applied at the arteriotomy site on the left wrist.  ° ° °Estimated blood loss <50 mL.  ° °During this procedure medications were administered to achieve and maintain moderate conscious sedation while the patient's heart rate, blood pressure, and oxygen saturation were continuously monitored and I was present face-to-face 100% of this time.  °Medications  °(Filter: Administrations occurring from 03/05/19 1509 to 03/05/19 1656)  °         °(important) Continuous medications are totaled by the amount administered until 03/05/19 1656.  °Medication Rate/Dose/Volume Action  Date Time    °Heparin (Porcine) in NaCl 1000-0.9 UT/500ML-% SOLN (mL) 500 mL Given 03/05/19 1512    °Total dose as of 03/05/19 1656 500 mL Given 1512    °1,000 mL         °diphenhydrAMINE (BENADRYL) injection (mg) 50 mg Given 03/05/19 1513    °Total dose as of 03/05/19 1656         °50 mg         °midazolam (VERSED) injection (mg) 1 mg Given 03/05/19 1521    °Total dose as of 03/05/19 1656 1 mg Given 1538    °2 mg         °fentaNYL (SUBLIMAZE) injection (mcg) 25 mcg Given 03/05/19 1521    °Total dose as of 03/05/19 1656 25 mcg Given 1539    °50  mcg         °lidocaine (PF) (XYLOCAINE) 1 % injection (mL) 2 mL Given 03/05/19 1528    °Total dose as of 03/05/19 1656 2 mL Given 1533    °14 mL 10 mL Given 1551    °Radial Cocktail/Verapamil only (mL) 10 mL Canceled Entry 03/05/19 1533    °Total dose as of 03/05/19 1656 10 mL Given 1612    °10 mL         °heparin injection (Units) 5,000 Units Given 03/05/19 1618    °Total dose as of 03/05/19 1656         °5,000 Units         °iohexol (OMNIPAQUE) 350 MG/ML injection (mL) 55 mL Given 03/05/19 1637    °Total dose as of 03/05/19 1656         °55 mL         °0.9 % sodium chloride infusion (mL/hr) 999 mL/hr Rate/Dose Change 03/05/19 1610    °Dosing weight: 100.2 kg         °Total dose as of 03/05/19 1656         °778.39 mL         °Sedation Time  °Sedation Time Physician-1: 1 hour 10 minutes 59 seconds  °Complications  °Complications documented before study   signed (03/05/2019 4:57 PM)   °RIGHT/LEFT HEART CATH AND CORONARY ANGIOGRAPHY   °None Documented by McAlhany, Christopher D, MD 03/05/2019 4:38 PM  °Date Found: 03/05/2019  °Time Range: Intraprocedure  °  °Coronary Findings  °Diagnostic  °Dominance: Right  °Left Anterior Descending  °Vessel is large.  °Mid LAD lesion 10% stenosed  °Mid LAD lesion is 10% stenosed.  °Left Circumflex  °Vessel is large.  °Prox Cx to Mid Cx lesion 20% stenosed  °Prox Cx to Mid Cx lesion is 20% stenosed.  °Right Coronary Artery  °Vessel is large.  °Prox RCA to Mid RCA lesion 10% stenosed  °Prox RCA to Mid RCA lesion is 10% stenosed.  °Intervention  °No interventions have been documented.  °Coronary Diagrams  °Diagnostic  °Dominance: Right  ° °Intervention  °Implants  °   °No implant documentation for this case.  °Syngo Images  Link to Procedure Log   °Show images for CARDIAC CATHETERIZATION Procedure Log  °Images on Long Term Storage    °Show images for Theresa Crawford, Theresa Crawford   °Hemo Data  ° Most Recent Value  °Fick Cardiac Output 6.17 L/min  °Fick Cardiac Output Index 3.06 (L/min)/BSA  °Aortic Mean  Gradient 6.4 mmHg  °Aortic Peak Gradient 7 mmHg  °Aortic Valve Area 2.59  °Aortic Value Area Index 1.29 cm2/BSA  °RA A Wave 9 mmHg  °RA V Wave 9 mmHg  °RA Mean 6 mmHg  °RV Systolic Pressure 47 mmHg  °RV Diastolic Pressure 2 mmHg  °RV EDP 7 mmHg  °PA Systolic Pressure 48 mmHg  °PA Diastolic Pressure 20 mmHg  °PA Mean 30 mmHg  °PW A Wave 17 mmHg  °PW V Wave 19 mmHg  °PW Mean 16 mmHg  °AO Systolic Pressure 98 mmHg  °AO Diastolic Pressure 64 mmHg  °AO Mean 79 mmHg  °LV Systolic Pressure 99 mmHg  °LV Diastolic Pressure 0 mmHg  °LV EDP 6 mmHg  °AOp Systolic Pressure 99 mmHg  °AOp Diastolic Pressure 58 mmHg  °AOp Mean Pressure 76 mmHg  °LVp Systolic Pressure 106 mmHg  °LVp Diastolic Pressure 1 mmHg  °LVp EDP Pressure 6 mmHg  °QP/QS 1  °TPVR Index 9.79 HRUI  °TSVR Index 25.76 HRUI  °PVR SVR Ratio 0.19  °TPVR/TSVR Ratio 0.38  ° ° ° ° °Pulmonary Function Tests ° °Baseline     Post-bronchodilator  °FVC 2.24 L (72% predicted)   FVC 2.39 L (77% predicted)  °FEV1 2.10 L (81% predicted)   FEV1 2.22 L (86% predicted)  °FEF25-75 4.07 L (141% predicted)  FEF25-75 5.07 L (176% predicted)  °TLC 3.76 L (74% predicted)  °RV 1.47 L (94% predicted)  °DLCO 50% predicted  ° ° ° ° °RIGHT HEART CATH  °Conclusion ° °1. Mildly elevated PCWP.  °2. Mild pulmonary venous hypertension, PVR 2.2 WU.  °3. Preserved cardiac output.  °  °There appears to be minimal fixed pulmonary hypertension, it does not appear that there is a significant PAH component from sarcoidosis. This would suggest that CHF is primarily related to mitral stenosis.   °Surgeon Notes ° °  03/05/2019  2:28 PM CV Procedure signed by Ross, Paula V, MD  °Procedural Details ° °Technical Details Procedure: Right Heart Cath ° °Indication: CHF, pulmonary hypertension  ° °Procedural Details: The right brachial area was prepped, draped, and anesthetized with 1% lidocaine. There was a pre-existing peripheral IV that was replaced with a 5F venous sheath. A Swan-Ganz catheter was used for the  right heart catheterization. Standard protocol was followed for recording of right   heart pressures and sampling of oxygen saturations. Fick cardiac output was calculated. There were no immediate procedural complications. The patient was transferred to the post catheterization recovery area for further monitoring. ° °Estimated blood loss <50 mL.  ° °During this procedure medications were administered to achieve and maintain moderate conscious sedation while the patient's heart rate, blood pressure, and oxygen saturation were continuously monitored and I was present face-to-face 100% of this time.  °Medications °(Filter: Administrations occurring from 03/27/19 1210 to 03/27/19 1307) °Medication Rate/Dose/Volume Action  Date Time   °midazolam (VERSED) injection (mg) 1 mg Given 03/27/19 1246   °Total dose as of 03/27/19 1307        °1 mg        °fentaNYL (SUBLIMAZE) injection (mcg) 25 mcg Given 03/27/19 1246   °Total dose as of 03/27/19 1307        °25 mcg        °lidocaine (PF) (XYLOCAINE) 1 % injection (mL) 2 mL Given 03/27/19 1248   °Total dose as of 03/27/19 1307        °2 mL        °Heparin (Porcine) in NaCl 1000-0.9 UT/500ML-% SOLN (mL) 500 mL Given 03/27/19 1249   °Total dose as of 03/27/19 1307        °500 mL        °albuterol (PROVENTIL) (2.5 MG/3ML) 0.083% nebulizer solution 2.5 mg (mg) *Not included in total MAR Hold 03/27/19 1210   °Dosing weight:  101.2 kg        °Total dose as of 03/27/19 1307        °Cannot be calculated        °budesonide (PULMICORT) nebulizer solution 0.5 mg (mg) *Not included in total MAR Hold 03/27/19 1210   °Dosing weight:  104.1 kg        °Total dose as of 03/27/19 1307        °Cannot be calculated        °busPIRone (BUSPAR) tablet 7.5 mg (mg) *Not included in total MAR Hold 03/27/19 1210   °Dosing weight:  101.2 kg        °Total dose as of 03/27/19 1307        °Cannot be calculated        °clonazePAM (KLONOPIN) tablet 0.5 mg (mg) *Not included in total MAR Hold 03/27/19 1210     °Dosing weight:  101.2 kg        °Total dose as of 03/27/19 1307        °Cannot be calculated        °enoxaparin (LOVENOX) injection 40 mg (mg) *Not included in total MAR Hold 03/27/19 1210   °Dosing weight:  101.2 kg        °Total dose as of 03/27/19 1307        °Cannot be calculated        °ferrous sulfate tablet 325 mg (mg) *Not included in total MAR Hold 03/27/19 1210   °Dosing weight:  101.2 kg        °Total dose as of 03/27/19 1307        °Cannot be calculated        °fluticasone (FLONASE) 50 MCG/ACT nasal spray 1 spray (spray) *Not included in total MAR Hold 03/27/19 1210   °Dosing weight:  104.1 kg        °Total dose as of 03/27/19 1307        °Cannot be calculated        °folic   acid (FOLVITE) tablet 0.5 mg (mg) *Not included in total MAR Hold 03/27/19 1210   °Dosing weight:  101.2 kg        °Total dose as of 03/27/19 1307        °Cannot be calculated        °furosemide (LASIX) injection 60 mg (mg) *Not included in total MAR Hold 03/27/19 1210   °Dosing weight:  104.1 kg        °Total dose as of 03/27/19 1307        °Cannot be calculated        °hydrOXYzine (VISTARIL) capsule 25 mg (mg) *Not included in total MAR Hold 03/27/19 1210   °Dosing weight:  101.2 kg        °Total dose as of 03/27/19 1307        °Cannot be calculated        °metoprolol succinate (TOPROL-XL) 24 hr tablet 25 mg (mg) *Not included in total MAR Hold 03/27/19 1210   °Dosing weight:  104.1 kg        °Total dose as of 03/27/19 1307        °Cannot be calculated        °patient's guide to using coumadin book *Not included in total MAR Hold 03/27/19 1211   °Dosing weight:  104.1 kg        °Total dose as of 03/27/19 1307        °Cannot be calculated        °potassium chloride SA (K-DUR) CR tablet 40 mEq (mEq) *Not included in total MAR Hold 03/27/19 1210   °Dosing weight:  101.2 kg        °Total dose as of 03/27/19 1307        °Cannot be calculated        °QUEtiapine (SEROQUEL) tablet 50 mg (mg) *Not included in total MAR Hold 03/27/19 1210    °Dosing weight:  101.2 kg        °Total dose as of 03/27/19 1307        °Cannot be calculated        °sertraline (ZOLOFT) tablet 100 mg (mg) *Not included in total MAR Hold 03/27/19 1210   °Dosing weight:  101.2 kg        °Total dose as of 03/27/19 1307        °Cannot be calculated        °sodium chloride flush (NS) 0.9 % injection 3 mL (mL) *Not included in total MAR Hold 03/27/19 1211   °Dosing weight:  104.1 kg        °Total dose as of 03/27/19 1307        °Cannot be calculated        °traZODone (DESYREL) tablet 25 mg (mg) *Not included in total MAR Hold 03/27/19 1210   °Dosing weight:  101.2 kg        °Total dose as of 03/27/19 1307        °Cannot be calculated        °Warfarin - Physician Dosing Inpatient *Not included in total MAR Hold 03/27/19 1210   °Dosing weight:  104.1 kg        °Total dose as of 03/27/19 1307        °Cannot be calculated        °Sedation Time ° °Sedation Time Physician-1: 13 minutes 49 seconds  °Right Heart ° °Right Heart Pressures [facility] Procedural Findings: °Hemodynamics (mmHg) °RA mean 8 °RV 50/10 °PA 47/22,   mean 32 °PCWP mean 17 ° °Oxygen saturations: °PA 69% °AO 99% ° °Cardiac Output (Fick) 6.69  °Cardiac Index (Fick) 3.23 °PVR 2.24 WU  °Implants    °No implant documentation for this case.  °Syngo Images ° ° Show images for CARDIAC CATHETERIZATION  °Images on Long Term Storage ° ° Show images for Theresa Crawford, Theresa Crawford  ° Link to Procedure Log ° °Procedure Log  °  °Hemo Data ° ° Most Recent Value  °Fick Cardiac Output 6.69 L/min  °Fick Cardiac Output Index 3.23 (L/min)/BSA  °RA A Wave 4 mmHg  °RA V Wave 11 mmHg  °RA Mean 8 mmHg  °RV Systolic Pressure 50 mmHg  °RV Diastolic Pressure 1 mmHg  °RV EDP 10 mmHg  °PA Systolic Pressure 47 mmHg  °PA Diastolic Pressure 22 mmHg  °PA Mean 32 mmHg  °PW A Wave 18 mmHg  °PW V Wave 21 mmHg  °PW Mean 17 mmHg  °QP/QS 1  °TPVR Index 9.91 HRUI  ° ° ° ° ° °Impression: ° °I have personally reviewed the patient's recent transthoracic and transesophageal  echocardiograms and diagnostic cardiac catheterization.  She underwent elective mitral valve repair in 2014 and has at least moderate and possibly severe mitral stenosis with mild mitral regurgitation.  She has suffered from symptoms of chronic diastolic congestive heart failure requiring numerous hospitalizations for acute exacerbation over the last several years.  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  Findings from her transthoracic and transesophageal echocardiograms suggest that her mitral valve disease is caused by inflammatory disease such as rheumatic heart disease and/or sarcoidosis.  There is moderate thickening with mild to moderate restricted mobility of the anterior leaf of the mitral valve with severely restricted mobility of the posterior leaflet.  There is an eccentric jet of regurgitation which courses anteriorly around the left atrium.  Although the mitral regurgitation was not directly quantified, it appears moderate in severity.  The annuloplasty ring appears intact.  By planimetry the valve area is less than 1.5 cm² and mean transvalvular gradients have ranged between 8 and 15 mmHg on previous echocardiograms.  Patient also has significant right ventricular chamber enlargement with moderate tricuspid regurgitation.  Options at this point include continued medical therapy versus redo mitral valve repair or replacement with or without tricuspid valve repair.  Based upon review of the patient's TEE I am not very optimistic that her mitral valve could be repaired a second time and yield a satisfactory long-term result.    Given her relatively young age, surgical intervention would likely mandate mitral valve replacement using a mechanical prosthetic valve.   ° ° °Plan: ° °The patient and her mother were counseled at length regarding the indications, risks and potential  benefits of redo mitral valve replacement with or without tricuspid valve repair.  The rationale for elective surgery has been explained, including a comparison between surgery and continued medical therapy with close follow-up.  We discussed the fact that her valvular heart disease is not imminently life-threatening, and continued long-term medical therapy remains a reasonable alternative.  We discussed the fact that her mitral valve would likely need to be replaced.  We discussed the possibility of replacing the mitral valve using a mechanical prosthesis with the attendant need for long-term anticoagulation versus the alternative of replacing it using a bioprosthetic tissue valve with its potential for late structural valve deterioration and failure.  Alternative surgical approaches have been discussed including a comparison between conventional sternotomy   and minimally-invasive techniques.  The relative risks and benefits of each have been reviewed as they pertain to the patient's specific circumstances, and expectations for the patient's postoperative convalescence has been discussed.  We discussed implications of long-term warfarin anticoagulation in the setting of a mechanical prosthetic valve in the mitral position.  We also discussed the impact of the likely diagnosis of pulmonary sarcoidosis on the patient's clinical condition and postoperative recovery.  At the time of surgery we will plan to attempt lymph node biopsy for definitive tissue diagnosis. ° °The patient and her mother understand and accept all potential risks of surgery including but not limited to risk of death, stroke or other neurologic complication, myocardial infarction, congestive heart failure, respiratory failure, renal failure, bleeding requiring transfusion and/or reexploration, arrhythmia, infection or other wound complications, pneumonia, pleural and/or pericardial effusion, pulmonary embolus, aortic dissection or other major  vascular complication, or delayed complications related to valve repair or replacement including but not limited to structural valve deterioration and failure, thrombosis, embolization, endocarditis, or paravalvular leak.  Specific risks potentially related to the minimally-invasive approach were discussed at length, including but not limited to risk of conversion to full or partial sternotomy, aortic dissection or other major vascular complication, unilateral acute lung injury or pulmonary edema, phrenic nerve dysfunction or paralysis, rib fracture, chronic pain, lung hernia, or lymphocele. All of their questions have been answered. ° °  ° ° °I spent in excess of 60 minutes during the conduct of this office consultation and >50% of this time involved direct face-to-face encounter with the patient for counseling and/or coordination of their care. ° ° ° °Theresa Avakian H. Dasie Chancellor, MD °04/03/2019 °4:01 PM ° °

## 2019-04-03 NOTE — Progress Notes (Signed)
PCP - Dr Juluis Mire  Cardiologist - Dr Jonna Munro  Chest x-ray - 04/03/2019  EKG - 04/03/2019  Stress Test - no  ECHO - 02/2019  Cardiac Cath - 03/2019  Sleep Study - no CPAP - no  LABS-CBCmCMP,ABG, PT, PTT, A1C, UA, PCR ASA-  ERAS-no  HA1C- 5.9- 04/03/2019 Fasting Blood Sugar - na Checks Blood Sugar __na___na times a day  Anesthesia-  Pt denies having chest pain, sob, or fever at this time. All instructions explained to the pt, with a verbal understanding of the material. Pt agrees to go over the instructions while at home for a better understanding. Pt also instructed to self quarantine after being tested for COVID-19. The opportunity to ask questions was provided.

## 2019-04-04 ENCOUNTER — Inpatient Hospital Stay (INDEPENDENT_AMBULATORY_CARE_PROVIDER_SITE_OTHER): Payer: Self-pay | Admitting: Primary Care

## 2019-04-04 MED ORDER — GLUTARALDEHYDE 0.625% SOAKING SOLUTION
TOPICAL | Status: DC
  Filled 2019-04-04: qty 50

## 2019-04-04 MED ORDER — MILRINONE LACTATE IN DEXTROSE 20-5 MG/100ML-% IV SOLN
0.3000 ug/kg/min | INTRAVENOUS | Status: DC
  Filled 2019-04-04: qty 100

## 2019-04-04 MED ORDER — POTASSIUM CHLORIDE 2 MEQ/ML IV SOLN
80.0000 meq | INTRAVENOUS | Status: DC
  Filled 2019-04-04: qty 40

## 2019-04-04 MED ORDER — TRANEXAMIC ACID 1000 MG/10ML IV SOLN
1.5000 mg/kg/h | INTRAVENOUS | Status: AC
  Administered 2019-04-05 (×2): 1.5 mg/kg/h via INTRAVENOUS
  Filled 2019-04-04 (×3): qty 25

## 2019-04-04 MED ORDER — NITROGLYCERIN IN D5W 200-5 MCG/ML-% IV SOLN
2.0000 ug/min | INTRAVENOUS | Status: AC
  Administered 2019-04-05: 16.6 ug/min via INTRAVENOUS
  Filled 2019-04-04: qty 250

## 2019-04-04 MED ORDER — TRANEXAMIC ACID (OHS) BOLUS VIA INFUSION
15.0000 mg/kg | INTRAVENOUS | Status: AC
  Administered 2019-04-05: 1537.5 mg via INTRAVENOUS
  Filled 2019-04-04: qty 1538

## 2019-04-04 MED ORDER — PLASMA-LYTE 148 IV SOLN
INTRAVENOUS | Status: DC
  Filled 2019-04-04: qty 2.5

## 2019-04-04 MED ORDER — SODIUM CHLORIDE 0.9 % IV SOLN
INTRAVENOUS | Status: DC
  Filled 2019-04-04: qty 30

## 2019-04-04 MED ORDER — VANCOMYCIN HCL 1000 MG IV SOLR
INTRAVENOUS | Status: DC
  Filled 2019-04-04: qty 1000

## 2019-04-04 MED ORDER — PHENYLEPHRINE HCL-NACL 20-0.9 MG/250ML-% IV SOLN
30.0000 ug/min | INTRAVENOUS | Status: DC
  Filled 2019-04-04: qty 250

## 2019-04-04 MED ORDER — EPINEPHRINE PF 1 MG/ML IJ SOLN
0.0000 ug/min | INTRAVENOUS | Status: DC
  Filled 2019-04-04: qty 4

## 2019-04-04 MED ORDER — LEVOFLOXACIN IN D5W 500 MG/100ML IV SOLN
500.0000 mg | INTRAVENOUS | Status: AC
  Administered 2019-04-05: 09:00:00 500 mg via INTRAVENOUS
  Filled 2019-04-04: qty 100

## 2019-04-04 MED ORDER — DEXMEDETOMIDINE HCL IN NACL 400 MCG/100ML IV SOLN
0.1000 ug/kg/h | INTRAVENOUS | Status: DC
  Filled 2019-04-04: qty 100

## 2019-04-04 MED ORDER — VANCOMYCIN HCL 10 G IV SOLR
1500.0000 mg | INTRAVENOUS | Status: AC
  Administered 2019-04-05: 1500 mg via INTRAVENOUS
  Filled 2019-04-04: qty 1500

## 2019-04-04 MED ORDER — DOPAMINE-DEXTROSE 3.2-5 MG/ML-% IV SOLN
0.0000 ug/kg/min | INTRAVENOUS | Status: DC
  Filled 2019-04-04: qty 250

## 2019-04-04 MED ORDER — MANNITOL 20 % IV SOLN
INTRAVENOUS | Status: DC
  Filled 2019-04-04 (×2): qty 13

## 2019-04-04 MED ORDER — INSULIN REGULAR(HUMAN) IN NACL 100-0.9 UT/100ML-% IV SOLN
INTRAVENOUS | Status: AC
  Administered 2019-04-05: 1 [IU]/h via INTRAVENOUS
  Filled 2019-04-04: qty 100

## 2019-04-04 MED ORDER — TRANEXAMIC ACID (OHS) PUMP PRIME SOLUTION
2.0000 mg/kg | INTRAVENOUS | Status: DC
  Filled 2019-04-04: qty 2.05

## 2019-04-05 ENCOUNTER — Inpatient Hospital Stay (HOSPITAL_COMMUNITY): Payer: Self-pay

## 2019-04-05 ENCOUNTER — Inpatient Hospital Stay (HOSPITAL_COMMUNITY)
Admission: RE | Admit: 2019-04-05 | Discharge: 2019-04-13 | DRG: 220 | Disposition: A | Discharge status: Home / Self Care | Payer: Self-pay | Attending: Thoracic Surgery (Cardiothoracic Vascular Surgery) | Admitting: Thoracic Surgery (Cardiothoracic Vascular Surgery)

## 2019-04-05 ENCOUNTER — Inpatient Hospital Stay (HOSPITAL_COMMUNITY): Payer: Self-pay | Admitting: Anesthesiology

## 2019-04-05 ENCOUNTER — Encounter (HOSPITAL_COMMUNITY): Payer: Self-pay | Admitting: *Deleted

## 2019-04-05 ENCOUNTER — Other Ambulatory Visit: Payer: Self-pay

## 2019-04-05 ENCOUNTER — Encounter (HOSPITAL_COMMUNITY)
Admission: RE | Disposition: A | Discharge status: Home / Self Care | Payer: Self-pay | Source: Home / Self Care | Attending: Thoracic Surgery (Cardiothoracic Vascular Surgery)

## 2019-04-05 DIAGNOSIS — Z90711 Acquired absence of uterus with remaining cervical stump: Secondary | ICD-10-CM

## 2019-04-05 DIAGNOSIS — R0602 Shortness of breath: Secondary | ICD-10-CM | POA: Diagnosis present

## 2019-04-05 DIAGNOSIS — I310 Chronic adhesive pericarditis: Secondary | ICD-10-CM | POA: Diagnosis present

## 2019-04-05 DIAGNOSIS — F319 Bipolar disorder, unspecified: Secondary | ICD-10-CM | POA: Diagnosis present

## 2019-04-05 DIAGNOSIS — J849 Interstitial pulmonary disease, unspecified: Secondary | ICD-10-CM | POA: Diagnosis present

## 2019-04-05 DIAGNOSIS — Z419 Encounter for procedure for purposes other than remedying health state, unspecified: Secondary | ICD-10-CM

## 2019-04-05 DIAGNOSIS — R0600 Dyspnea, unspecified: Secondary | ICD-10-CM | POA: Diagnosis present

## 2019-04-05 DIAGNOSIS — D509 Iron deficiency anemia, unspecified: Secondary | ICD-10-CM | POA: Diagnosis present

## 2019-04-05 DIAGNOSIS — F329 Major depressive disorder, single episode, unspecified: Secondary | ICD-10-CM | POA: Diagnosis present

## 2019-04-05 DIAGNOSIS — Z9889 Other specified postprocedural states: Secondary | ICD-10-CM

## 2019-04-05 DIAGNOSIS — I081 Rheumatic disorders of both mitral and tricuspid valves: Principal | ICD-10-CM | POA: Diagnosis present

## 2019-04-05 DIAGNOSIS — Z954 Presence of other heart-valve replacement: Secondary | ICD-10-CM

## 2019-04-05 DIAGNOSIS — J42 Unspecified chronic bronchitis: Secondary | ICD-10-CM | POA: Diagnosis present

## 2019-04-05 DIAGNOSIS — I340 Nonrheumatic mitral (valve) insufficiency: Secondary | ICD-10-CM

## 2019-04-05 DIAGNOSIS — M5136 Other intervertebral disc degeneration, lumbar region: Secondary | ICD-10-CM | POA: Diagnosis present

## 2019-04-05 DIAGNOSIS — J9811 Atelectasis: Secondary | ICD-10-CM

## 2019-04-05 DIAGNOSIS — I071 Rheumatic tricuspid insufficiency: Secondary | ICD-10-CM | POA: Diagnosis present

## 2019-04-05 DIAGNOSIS — F418 Other specified anxiety disorders: Secondary | ICD-10-CM | POA: Diagnosis present

## 2019-04-05 DIAGNOSIS — I050 Rheumatic mitral stenosis: Secondary | ICD-10-CM

## 2019-04-05 DIAGNOSIS — Z560 Unemployment, unspecified: Secondary | ICD-10-CM

## 2019-04-05 DIAGNOSIS — I272 Pulmonary hypertension, unspecified: Secondary | ICD-10-CM | POA: Diagnosis present

## 2019-04-05 DIAGNOSIS — R0902 Hypoxemia: Secondary | ICD-10-CM | POA: Diagnosis present

## 2019-04-05 DIAGNOSIS — R590 Localized enlarged lymph nodes: Secondary | ICD-10-CM

## 2019-04-05 DIAGNOSIS — K219 Gastro-esophageal reflux disease without esophagitis: Secondary | ICD-10-CM | POA: Diagnosis present

## 2019-04-05 DIAGNOSIS — Z888 Allergy status to other drugs, medicaments and biological substances status: Secondary | ICD-10-CM

## 2019-04-05 DIAGNOSIS — I342 Nonrheumatic mitral (valve) stenosis: Secondary | ICD-10-CM

## 2019-04-05 DIAGNOSIS — I2510 Atherosclerotic heart disease of native coronary artery without angina pectoris: Secondary | ICD-10-CM | POA: Diagnosis present

## 2019-04-05 DIAGNOSIS — Z91041 Radiographic dye allergy status: Secondary | ICD-10-CM

## 2019-04-05 DIAGNOSIS — F17210 Nicotine dependence, cigarettes, uncomplicated: Secondary | ICD-10-CM | POA: Diagnosis present

## 2019-04-05 DIAGNOSIS — I110 Hypertensive heart disease with heart failure: Secondary | ICD-10-CM | POA: Diagnosis present

## 2019-04-05 DIAGNOSIS — Z90721 Acquired absence of ovaries, unilateral: Secondary | ICD-10-CM

## 2019-04-05 DIAGNOSIS — E876 Hypokalemia: Secondary | ICD-10-CM | POA: Diagnosis not present

## 2019-04-05 DIAGNOSIS — Z880 Allergy status to penicillin: Secondary | ICD-10-CM

## 2019-04-05 DIAGNOSIS — Z87892 Personal history of anaphylaxis: Secondary | ICD-10-CM

## 2019-04-05 DIAGNOSIS — M4690 Unspecified inflammatory spondylopathy, site unspecified: Secondary | ICD-10-CM | POA: Diagnosis present

## 2019-04-05 DIAGNOSIS — Z8249 Family history of ischemic heart disease and other diseases of the circulatory system: Secondary | ICD-10-CM

## 2019-04-05 DIAGNOSIS — E669 Obesity, unspecified: Secondary | ICD-10-CM | POA: Diagnosis present

## 2019-04-05 DIAGNOSIS — Z79899 Other long term (current) drug therapy: Secondary | ICD-10-CM

## 2019-04-05 DIAGNOSIS — D869 Sarcoidosis, unspecified: Secondary | ICD-10-CM | POA: Diagnosis present

## 2019-04-05 DIAGNOSIS — Z91030 Bee allergy status: Secondary | ICD-10-CM

## 2019-04-05 DIAGNOSIS — I052 Rheumatic mitral stenosis with insufficiency: Secondary | ICD-10-CM | POA: Diagnosis present

## 2019-04-05 DIAGNOSIS — Z952 Presence of prosthetic heart valve: Secondary | ICD-10-CM

## 2019-04-05 DIAGNOSIS — I10 Essential (primary) hypertension: Secondary | ICD-10-CM | POA: Diagnosis present

## 2019-04-05 DIAGNOSIS — I5032 Chronic diastolic (congestive) heart failure: Secondary | ICD-10-CM | POA: Diagnosis present

## 2019-04-05 DIAGNOSIS — Z8701 Personal history of pneumonia (recurrent): Secondary | ICD-10-CM

## 2019-04-05 DIAGNOSIS — Z91013 Allergy to seafood: Secondary | ICD-10-CM

## 2019-04-05 DIAGNOSIS — Z7951 Long term (current) use of inhaled steroids: Secondary | ICD-10-CM

## 2019-04-05 DIAGNOSIS — I509 Heart failure, unspecified: Secondary | ICD-10-CM

## 2019-04-05 DIAGNOSIS — Z6838 Body mass index (BMI) 38.0-38.9, adult: Secondary | ICD-10-CM

## 2019-04-05 DIAGNOSIS — I361 Nonrheumatic tricuspid (valve) insufficiency: Secondary | ICD-10-CM

## 2019-04-05 DIAGNOSIS — R591 Generalized enlarged lymph nodes: Secondary | ICD-10-CM | POA: Diagnosis present

## 2019-04-05 DIAGNOSIS — D62 Acute posthemorrhagic anemia: Secondary | ICD-10-CM | POA: Diagnosis not present

## 2019-04-05 DIAGNOSIS — I493 Ventricular premature depolarization: Secondary | ICD-10-CM | POA: Diagnosis not present

## 2019-04-05 DIAGNOSIS — F419 Anxiety disorder, unspecified: Secondary | ICD-10-CM | POA: Diagnosis present

## 2019-04-05 HISTORY — PX: TEE WITHOUT CARDIOVERSION: SHX5443

## 2019-04-05 HISTORY — PX: TRICUSPID VALVE REPLACEMENT: SHX816

## 2019-04-05 HISTORY — DX: Presence of other heart-valve replacement: Z95.4

## 2019-04-05 HISTORY — PX: MITRAL VALVE REPLACEMENT: SHX147

## 2019-04-05 HISTORY — DX: Other specified postprocedural states: Z98.890

## 2019-04-05 LAB — POCT I-STAT 7, (LYTES, BLD GAS, ICA,H+H)
Acid-base deficit: 4 mmol/L — ABNORMAL HIGH (ref 0.0–2.0)
Acid-base deficit: 2 mmol/L (ref 0.0–2.0)
Acid-base deficit: 2 mmol/L (ref 0.0–2.0)
Acid-base deficit: 6 mmol/L — ABNORMAL HIGH (ref 0.0–2.0)
Acid-base deficit: 5 mmol/L — ABNORMAL HIGH (ref 0.0–2.0)
Acid-base deficit: 7 mmol/L — ABNORMAL HIGH (ref 0.0–2.0)
Acid-base deficit: 2 mmol/L (ref 0.0–2.0)
Bicarbonate: 23.5 mmol/L (ref 20.0–28.0)
Bicarbonate: 22.5 mmol/L (ref 20.0–28.0)
Bicarbonate: 19.8 mmol/L — ABNORMAL LOW (ref 20.0–28.0)
Bicarbonate: 24 mmol/L (ref 20.0–28.0)
Bicarbonate: 24.6 mmol/L (ref 20.0–28.0)
Bicarbonate: 20.8 mmol/L (ref 20.0–28.0)
Bicarbonate: 23.6 mmol/L (ref 20.0–28.0)
Calcium, Ion: 1.21 mmol/L (ref 1.15–1.40)
Calcium, Ion: 1 mmol/L — ABNORMAL LOW (ref 1.15–1.40)
Calcium, Ion: 1.07 mmol/L — ABNORMAL LOW (ref 1.15–1.40)
Calcium, Ion: 1.28 mmol/L (ref 1.15–1.40)
Calcium, Ion: 1.2 mmol/L (ref 1.15–1.40)
Calcium, Ion: 1.1 mmol/L — ABNORMAL LOW (ref 1.15–1.40)
Calcium, Ion: 1.13 mmol/L — ABNORMAL LOW (ref 1.15–1.40)
HCT: 34 % — ABNORMAL LOW (ref 36.0–46.0)
HCT: 23 % — ABNORMAL LOW (ref 36.0–46.0)
HCT: 23 % — ABNORMAL LOW (ref 36.0–46.0)
HCT: 25 % — ABNORMAL LOW (ref 36.0–46.0)
HCT: 37 % (ref 36.0–46.0)
HCT: 33 % — ABNORMAL LOW (ref 36.0–46.0)
HCT: 34 % — ABNORMAL LOW (ref 36.0–46.0)
Hemoglobin: 11.6 g/dL — ABNORMAL LOW (ref 12.0–15.0)
Hemoglobin: 7.8 g/dL — ABNORMAL LOW (ref 12.0–15.0)
Hemoglobin: 7.8 g/dL — ABNORMAL LOW (ref 12.0–15.0)
Hemoglobin: 8.5 g/dL — ABNORMAL LOW (ref 12.0–15.0)
Hemoglobin: 12.6 g/dL (ref 12.0–15.0)
Hemoglobin: 11.2 g/dL — ABNORMAL LOW (ref 12.0–15.0)
Hemoglobin: 11.6 g/dL — ABNORMAL LOW (ref 12.0–15.0)
O2 Saturation: 89 %
O2 Saturation: 100 %
O2 Saturation: 100 %
O2 Saturation: 94 %
O2 Saturation: 93 %
O2 Saturation: 97 %
O2 Saturation: 99 %
Patient temperature: 36.6
Patient temperature: 36.3
Patient temperature: 36.2
Patient temperature: 36.4
Potassium: 4 mmol/L (ref 3.5–5.1)
Potassium: 4 mmol/L (ref 3.5–5.1)
Potassium: 3.9 mmol/L (ref 3.5–5.1)
Potassium: 4.1 mmol/L (ref 3.5–5.1)
Potassium: 4.1 mmol/L (ref 3.5–5.1)
Potassium: 3.9 mmol/L (ref 3.5–5.1)
Potassium: 5.2 mmol/L — ABNORMAL HIGH (ref 3.5–5.1)
Sodium: 138 mmol/L (ref 135–145)
Sodium: 141 mmol/L (ref 135–145)
Sodium: 140 mmol/L (ref 135–145)
Sodium: 144 mmol/L (ref 135–145)
Sodium: 143 mmol/L (ref 135–145)
Sodium: 144 mmol/L (ref 135–145)
Sodium: 142 mmol/L (ref 135–145)
TCO2: 25 mmol/L (ref 22–32)
TCO2: 24 mmol/L (ref 22–32)
TCO2: 21 mmol/L — ABNORMAL LOW (ref 22–32)
TCO2: 25 mmol/L (ref 22–32)
TCO2: 27 mmol/L (ref 22–32)
TCO2: 22 mmol/L (ref 22–32)
TCO2: 25 mmol/L (ref 22–32)
pCO2 arterial: 52.5 mmHg — ABNORMAL HIGH (ref 32.0–48.0)
pCO2 arterial: 37.2 mmHg (ref 32.0–48.0)
pCO2 arterial: 41.1 mmHg (ref 32.0–48.0)
pCO2 arterial: 42.9 mmHg (ref 32.0–48.0)
pCO2 arterial: 63.9 mmHg — ABNORMAL HIGH (ref 32.0–48.0)
pCO2 arterial: 47.1 mmHg (ref 32.0–48.0)
pCO2 arterial: 43 mmHg (ref 32.0–48.0)
pH, Arterial: 7.259 — ABNORMAL LOW (ref 7.350–7.450)
pH, Arterial: 7.389 (ref 7.350–7.450)
pH, Arterial: 7.292 — ABNORMAL LOW (ref 7.350–7.450)
pH, Arterial: 7.353 (ref 7.350–7.450)
pH, Arterial: 7.188 — CL (ref 7.350–7.450)
pH, Arterial: 7.249 — ABNORMAL LOW (ref 7.350–7.450)
pH, Arterial: 7.344 — ABNORMAL LOW (ref 7.350–7.450)
pO2, Arterial: 65 mmHg — ABNORMAL LOW (ref 83.0–108.0)
pO2, Arterial: 411 mmHg — ABNORMAL HIGH (ref 83.0–108.0)
pO2, Arterial: 274 mmHg — ABNORMAL HIGH (ref 83.0–108.0)
pO2, Arterial: 73 mmHg — ABNORMAL LOW (ref 83.0–108.0)
pO2, Arterial: 83 mmHg (ref 83.0–108.0)
pO2, Arterial: 104 mmHg (ref 83.0–108.0)
pO2, Arterial: 144 mmHg — ABNORMAL HIGH (ref 83.0–108.0)

## 2019-04-05 LAB — POCT I-STAT 4, (NA,K, GLUC, HGB,HCT)
Glucose, Bld: 121 mg/dL — ABNORMAL HIGH (ref 70–99)
Glucose, Bld: 119 mg/dL — ABNORMAL HIGH (ref 70–99)
Glucose, Bld: 147 mg/dL — ABNORMAL HIGH (ref 70–99)
Glucose, Bld: 160 mg/dL — ABNORMAL HIGH (ref 70–99)
Glucose, Bld: 176 mg/dL — ABNORMAL HIGH (ref 70–99)
Glucose, Bld: 137 mg/dL — ABNORMAL HIGH (ref 70–99)
Glucose, Bld: 103 mg/dL — ABNORMAL HIGH (ref 70–99)
Glucose, Bld: 92 mg/dL (ref 70–99)
HCT: 33 % — ABNORMAL LOW (ref 36.0–46.0)
HCT: 29 % — ABNORMAL LOW (ref 36.0–46.0)
HCT: 22 % — ABNORMAL LOW (ref 36.0–46.0)
HCT: 26 % — ABNORMAL LOW (ref 36.0–46.0)
HCT: 26 % — ABNORMAL LOW (ref 36.0–46.0)
HCT: 25 % — ABNORMAL LOW (ref 36.0–46.0)
HCT: 22 % — ABNORMAL LOW (ref 36.0–46.0)
HCT: 36 % (ref 36.0–46.0)
Hemoglobin: 11.2 g/dL — ABNORMAL LOW (ref 12.0–15.0)
Hemoglobin: 9.9 g/dL — ABNORMAL LOW (ref 12.0–15.0)
Hemoglobin: 7.5 g/dL — ABNORMAL LOW (ref 12.0–15.0)
Hemoglobin: 8.8 g/dL — ABNORMAL LOW (ref 12.0–15.0)
Hemoglobin: 8.8 g/dL — ABNORMAL LOW (ref 12.0–15.0)
Hemoglobin: 8.5 g/dL — ABNORMAL LOW (ref 12.0–15.0)
Hemoglobin: 7.5 g/dL — ABNORMAL LOW (ref 12.0–15.0)
Hemoglobin: 12.2 g/dL (ref 12.0–15.0)
Potassium: 4 mmol/L (ref 3.5–5.1)
Potassium: 3.9 mmol/L (ref 3.5–5.1)
Potassium: 4 mmol/L (ref 3.5–5.1)
Potassium: 3.9 mmol/L (ref 3.5–5.1)
Potassium: 4 mmol/L (ref 3.5–5.1)
Potassium: 5.2 mmol/L — ABNORMAL HIGH (ref 3.5–5.1)
Potassium: 4 mmol/L (ref 3.5–5.1)
Potassium: 3.9 mmol/L (ref 3.5–5.1)
Sodium: 138 mmol/L (ref 135–145)
Sodium: 138 mmol/L (ref 135–145)
Sodium: 139 mmol/L (ref 135–145)
Sodium: 137 mmol/L (ref 135–145)
Sodium: 138 mmol/L (ref 135–145)
Sodium: 136 mmol/L (ref 135–145)
Sodium: 139 mmol/L (ref 135–145)
Sodium: 144 mmol/L (ref 135–145)

## 2019-04-05 LAB — CBC
HCT: 35.5 % — ABNORMAL LOW (ref 36.0–46.0)
HCT: 32.9 % — ABNORMAL LOW (ref 36.0–46.0)
Hemoglobin: 11.4 g/dL — ABNORMAL LOW (ref 12.0–15.0)
Hemoglobin: 10.6 g/dL — ABNORMAL LOW (ref 12.0–15.0)
MCH: 27.5 pg (ref 26.0–34.0)
MCH: 27 pg (ref 26.0–34.0)
MCHC: 32.1 g/dL (ref 30.0–36.0)
MCHC: 32.2 g/dL (ref 30.0–36.0)
MCV: 85.7 fL (ref 80.0–100.0)
MCV: 83.7 fL (ref 80.0–100.0)
Platelets: 195 10*3/uL (ref 150–400)
Platelets: 168 10*3/uL (ref 150–400)
RBC: 4.14 MIL/uL (ref 3.87–5.11)
RBC: 3.93 MIL/uL (ref 3.87–5.11)
RDW: 18.2 % — ABNORMAL HIGH (ref 11.5–15.5)
RDW: 17.8 % — ABNORMAL HIGH (ref 11.5–15.5)
WBC: 23.6 10*3/uL — ABNORMAL HIGH (ref 4.0–10.5)
WBC: 17 10*3/uL — ABNORMAL HIGH (ref 4.0–10.5)
nRBC: 0.1 % (ref 0.0–0.2)
nRBC: 0 % (ref 0.0–0.2)

## 2019-04-05 LAB — POCT I-STAT EG7
Acid-base deficit: 3 mmol/L — ABNORMAL HIGH (ref 0.0–2.0)
Bicarbonate: 24.1 mmol/L (ref 20.0–28.0)
Calcium, Ion: 1.17 mmol/L (ref 1.15–1.40)
HCT: 27 % — ABNORMAL LOW (ref 36.0–46.0)
Hemoglobin: 9.2 g/dL — ABNORMAL LOW (ref 12.0–15.0)
O2 Saturation: 54 %
Potassium: 4.3 mmol/L (ref 3.5–5.1)
Sodium: 144 mmol/L (ref 135–145)
TCO2: 26 mmol/L (ref 22–32)
pCO2, Ven: 53 mmHg (ref 44.0–60.0)
pH, Ven: 7.267 (ref 7.250–7.430)
pO2, Ven: 33 mmHg (ref 32.0–45.0)

## 2019-04-05 LAB — GLUCOSE, CAPILLARY
Glucose-Capillary: 112 mg/dL — ABNORMAL HIGH (ref 70–99)
Glucose-Capillary: 87 mg/dL (ref 70–99)
Glucose-Capillary: 86 mg/dL (ref 70–99)
Glucose-Capillary: 97 mg/dL (ref 70–99)
Glucose-Capillary: 149 mg/dL — ABNORMAL HIGH (ref 70–99)

## 2019-04-05 LAB — MAGNESIUM: Magnesium: 3.2 mg/dL — ABNORMAL HIGH (ref 1.7–2.4)

## 2019-04-05 LAB — CREATININE, SERUM
Creatinine, Ser: 0.92 mg/dL (ref 0.44–1.00)
GFR calc Af Amer: >60 mL/min (ref >60)
GFR calc non Af Amer: >60 mL/min (ref >60)

## 2019-04-05 LAB — APTT: aPTT: 38 seconds — ABNORMAL HIGH (ref 24–36)

## 2019-04-05 LAB — HEMOGLOBIN AND HEMATOCRIT, BLOOD
HCT: 23.8 % — ABNORMAL LOW (ref 36.0–46.0)
Hemoglobin: 7.3 g/dL — ABNORMAL LOW (ref 12.0–15.0)

## 2019-04-05 LAB — ECHO INTRAOPERATIVE TEE
Height: 64 in
Weight: 3616 oz

## 2019-04-05 LAB — FIBRINOGEN: Fibrinogen: 366 mg/dL (ref 210–475)

## 2019-04-05 LAB — PROTIME-INR
INR: 1.7 — ABNORMAL HIGH (ref 0.8–1.2)
Prothrombin Time: 19.6 seconds — ABNORMAL HIGH (ref 11.4–15.2)

## 2019-04-05 LAB — PLATELET COUNT: Platelets: 251 10*3/uL (ref 150–400)

## 2019-04-05 LAB — PREPARE RBC (CROSSMATCH)

## 2019-04-05 LAB — POCT PREGNANCY, URINE: Preg Test, Ur: NEGATIVE

## 2019-04-05 SURGERY — ECHOCARDIOGRAM, TRANSESOPHAGEAL
Anesthesia: General | Site: Chest | Laterality: Right

## 2019-04-05 MED ORDER — CHLORHEXIDINE GLUCONATE 0.12% ORAL RINSE (MEDLINE KIT)
15.0000 mL | Freq: Two times a day (BID) | OROMUCOSAL | Status: DC
  Administered 2019-04-05 – 2019-04-09 (×7): 15 mL via OROMUCOSAL

## 2019-04-05 MED ORDER — VANCOMYCIN HCL IN DEXTROSE 1-5 GM/200ML-% IV SOLN
1000.0000 mg | Freq: Once | INTRAVENOUS | Status: AC
  Administered 2019-04-05: 21:00:00 1000 mg via INTRAVENOUS
  Filled 2019-04-05: qty 200

## 2019-04-05 MED ORDER — FENTANYL CITRATE (PF) 250 MCG/5ML IJ SOLN
INTRAMUSCULAR | Status: AC
  Filled 2019-04-05: qty 25

## 2019-04-05 MED ORDER — PHENYLEPHRINE HCL (PRESSORS) 10 MG/ML IV SOLN
INTRAVENOUS | Status: DC | PRN
  Administered 2019-04-05: 80 ug via INTRAVENOUS

## 2019-04-05 MED ORDER — CHLORHEXIDINE GLUCONATE CLOTH 2 % EX PADS
6.0000 | MEDICATED_PAD | Freq: Every day | CUTANEOUS | Status: DC
  Administered 2019-04-06 – 2019-04-09 (×2): 6 via TOPICAL

## 2019-04-05 MED ORDER — FENTANYL CITRATE (PF) 250 MCG/5ML IJ SOLN
INTRAMUSCULAR | Status: DC | PRN
  Administered 2019-04-05: 200 ug via INTRAVENOUS
  Administered 2019-04-05: 50 ug via INTRAVENOUS
  Administered 2019-04-05: 200 ug via INTRAVENOUS
  Administered 2019-04-05: 300 ug via INTRAVENOUS
  Administered 2019-04-05: 250 ug via INTRAVENOUS

## 2019-04-05 MED ORDER — LACTATED RINGERS IV SOLN
INTRAVENOUS | Status: DC | PRN
  Administered 2019-04-05 (×2): via INTRAVENOUS

## 2019-04-05 MED ORDER — MORPHINE SULFATE (PF) 2 MG/ML IV SOLN
1.0000 mg | INTRAVENOUS | Status: DC | PRN
  Administered 2019-04-05 – 2019-04-06 (×3): 2 mg via INTRAVENOUS
  Administered 2019-04-06: 4 mg via INTRAVENOUS
  Administered 2019-04-06 – 2019-04-09 (×5): 2 mg via INTRAVENOUS
  Filled 2019-04-05: qty 2
  Filled 2019-04-05 (×8): qty 1

## 2019-04-05 MED ORDER — VANCOMYCIN HCL 1000 MG IV SOLR
INTRAVENOUS | Status: DC | PRN
  Administered 2019-04-05: 1000 mL

## 2019-04-05 MED ORDER — ORAL CARE MOUTH RINSE
15.0000 mL | OROMUCOSAL | Status: DC
  Administered 2019-04-05 – 2019-04-06 (×8): 15 mL via OROMUCOSAL

## 2019-04-05 MED ORDER — SODIUM CHLORIDE 0.9 % IV SOLN
INTRAVENOUS | Status: DC
  Administered 2019-04-05: 16:00:00 via INTRAVENOUS

## 2019-04-05 MED ORDER — BUDESONIDE 0.5 MG/2ML IN SUSP
1.0000 mg | Freq: Two times a day (BID) | RESPIRATORY_TRACT | Status: DC
  Administered 2019-04-05 – 2019-04-06 (×2): 1 mg via RESPIRATORY_TRACT
  Administered 2019-04-06: 0.5 mg via RESPIRATORY_TRACT
  Administered 2019-04-07 – 2019-04-09 (×5): 1 mg via RESPIRATORY_TRACT
  Administered 2019-04-09: 0.5 mg via RESPIRATORY_TRACT
  Administered 2019-04-10 – 2019-04-12 (×6): 1 mg via RESPIRATORY_TRACT
  Filled 2019-04-05 (×17): qty 4

## 2019-04-05 MED ORDER — MIDAZOLAM HCL 2 MG/2ML IJ SOLN: 2.0000 mg | INTRAMUSCULAR | Status: DC | PRN

## 2019-04-05 MED ORDER — PROPOFOL 10 MG/ML IV BOLUS
INTRAVENOUS | Status: DC | PRN
  Administered 2019-04-05: 120 mg via INTRAVENOUS

## 2019-04-05 MED ORDER — METOPROLOL TARTRATE 5 MG/5ML IV SOLN: 2.5000 mg | INTRAVENOUS | Status: DC | PRN

## 2019-04-05 MED ORDER — ACETAMINOPHEN 650 MG RE SUPP
650.0000 mg | Freq: Once | RECTAL | Status: AC
  Administered 2019-04-05: 650 mg via RECTAL

## 2019-04-05 MED ORDER — BISACODYL 10 MG RE SUPP: 10.0000 mg | Freq: Every day | RECTAL | Status: DC

## 2019-04-05 MED ORDER — CHLORHEXIDINE GLUCONATE 0.12 % MT SOLN
15.0000 mL | OROMUCOSAL | Status: AC
  Administered 2019-04-05: 15 mL via OROMUCOSAL

## 2019-04-05 MED ORDER — METOPROLOL TARTRATE 12.5 MG HALF TABLET
12.5000 mg | ORAL_TABLET | Freq: Once | ORAL | Status: AC
  Administered 2019-04-05: 07:00:00 12.5 mg via ORAL
  Filled 2019-04-05: qty 1

## 2019-04-05 MED ORDER — SODIUM CHLORIDE 0.9% FLUSH
10.0000 mL | INTRAVENOUS | Status: DC | PRN
  Administered 2019-04-09: 10 mL
  Filled 2019-04-05: qty 40

## 2019-04-05 MED ORDER — PHENYLEPHRINE HCL-NACL 10-0.9 MG/250ML-% IV SOLN: 0.0000 ug/min | INTRAVENOUS | Status: DC

## 2019-04-05 MED ORDER — ROCURONIUM BROMIDE 10 MG/ML (PF) SYRINGE
PREFILLED_SYRINGE | INTRAVENOUS | Status: AC
  Filled 2019-04-05: qty 20

## 2019-04-05 MED ORDER — SODIUM CHLORIDE 0.9 % IV SOLN
INTRAVENOUS | Status: DC | PRN
  Administered 2019-04-05: 09:00:00 10 ug/min via INTRAVENOUS

## 2019-04-05 MED ORDER — CHLORHEXIDINE GLUCONATE 4 % EX LIQD: 30.0000 mL | CUTANEOUS | Status: DC

## 2019-04-05 MED ORDER — MIDAZOLAM HCL (PF) 10 MG/2ML IJ SOLN
INTRAMUSCULAR | Status: AC
  Filled 2019-04-05: qty 2

## 2019-04-05 MED ORDER — INSULIN REGULAR(HUMAN) IN NACL 100-0.9 UT/100ML-% IV SOLN
INTRAVENOUS | Status: DC
  Administered 2019-04-05: 21:00:00 0.4 [IU]/h via INTRAVENOUS
  Filled 2019-04-05: qty 100

## 2019-04-05 MED ORDER — INSULIN REGULAR BOLUS VIA INFUSION
0.0000 [IU] | Freq: Three times a day (TID) | INTRAVENOUS | Status: DC
  Filled 2019-04-05: qty 10

## 2019-04-05 MED ORDER — HEPARIN SODIUM (PORCINE) 1000 UNIT/ML IJ SOLN
INTRAMUSCULAR | Status: AC
  Filled 2019-04-05: qty 1

## 2019-04-05 MED ORDER — ACETAMINOPHEN 160 MG/5ML PO SOLN: 650.0000 mg | Freq: Once | ORAL | Status: AC

## 2019-04-05 MED ORDER — SODIUM CHLORIDE 0.45 % IV SOLN: INTRAVENOUS | Status: DC | PRN

## 2019-04-05 MED ORDER — EPINEPHRINE PF 1 MG/ML IJ SOLN
INTRAVENOUS | Status: DC | PRN
  Administered 2019-04-05: 2 ug/min via INTRAVENOUS

## 2019-04-05 MED ORDER — DEXMEDETOMIDINE HCL IN NACL 200 MCG/50ML IV SOLN
0.1000 ug/kg/h | INTRAVENOUS | Status: DC
  Administered 2019-04-05 – 2019-04-06 (×2): 0.5 ug/kg/h via INTRAVENOUS
  Administered 2019-04-06: 0.3 ug/kg/h via INTRAVENOUS
  Filled 2019-04-05 (×2): qty 50
  Filled 2019-04-05: qty 100

## 2019-04-05 MED ORDER — LACTATED RINGERS IV SOLN: 500.0000 mL | Freq: Once | INTRAVENOUS | Status: DC | PRN

## 2019-04-05 MED ORDER — SODIUM CHLORIDE 0.9% FLUSH
10.0000 mL | Freq: Two times a day (BID) | INTRAVENOUS | Status: DC
  Administered 2019-04-05 – 2019-04-09 (×8): 10 mL

## 2019-04-05 MED ORDER — SODIUM CHLORIDE 0.9 % IV SOLN
INTRAVENOUS | Status: DC
  Administered 2019-04-05: 17:00:00 via INTRAVENOUS

## 2019-04-05 MED ORDER — SODIUM CHLORIDE 0.9 % IV SOLN
INTRAVENOUS | Status: DC | PRN
  Administered 2019-04-05: 09:00:00 via INTRAVENOUS

## 2019-04-05 MED ORDER — CHLORHEXIDINE GLUCONATE 0.12 % MT SOLN
15.0000 mL | Freq: Once | OROMUCOSAL | Status: AC
  Administered 2019-04-05: 07:00:00 15 mL via OROMUCOSAL
  Filled 2019-04-05: qty 15

## 2019-04-05 MED ORDER — EPINEPHRINE PF 1 MG/ML IJ SOLN
0.0000 ug/min | INTRAVENOUS | Status: DC
  Filled 2019-04-05: qty 4

## 2019-04-05 MED ORDER — SODIUM CHLORIDE 0.9% FLUSH
3.0000 mL | Freq: Two times a day (BID) | INTRAVENOUS | Status: DC
  Administered 2019-04-06 – 2019-04-09 (×6): 3 mL via INTRAVENOUS

## 2019-04-05 MED ORDER — LACTATED RINGERS IV SOLN
INTRAVENOUS | Status: DC | PRN
  Administered 2019-04-05 (×2): via INTRAVENOUS

## 2019-04-05 MED ORDER — PLASMA-LYTE 148 IV SOLN
INTRAVENOUS | Status: DC | PRN
  Administered 2019-04-05: 500 mL via INTRAVASCULAR

## 2019-04-05 MED ORDER — MILRINONE LACTATE IN DEXTROSE 20-5 MG/100ML-% IV SOLN
0.0000 ug/kg/min | INTRAVENOUS | Status: DC
  Administered 2019-04-05 – 2019-04-06 (×2): 0.3 ug/kg/min via INTRAVENOUS
  Administered 2019-04-06: 0.2 ug/kg/min via INTRAVENOUS
  Administered 2019-04-07: 20:00:00 0.1 ug/kg/min via INTRAVENOUS
  Filled 2019-04-05 (×4): qty 100

## 2019-04-05 MED ORDER — SODIUM CHLORIDE 0.9% FLUSH: 3.0000 mL | INTRAVENOUS | Status: DC | PRN

## 2019-04-05 MED ORDER — NOREPINEPHRINE BITARTRATE 1 MG/ML IV SOLN
INTRAVENOUS | Status: DC | PRN
  Administered 2019-04-05: 14:00:00 4 ug/min via INTRAVENOUS

## 2019-04-05 MED ORDER — ACETAMINOPHEN 500 MG PO TABS
1000.0000 mg | ORAL_TABLET | Freq: Four times a day (QID) | ORAL | Status: AC
  Administered 2019-04-06 – 2019-04-10 (×18): 1000 mg via ORAL
  Filled 2019-04-05 (×16): qty 2

## 2019-04-05 MED ORDER — LACTATED RINGERS IV SOLN: INTRAVENOUS | Status: DC

## 2019-04-05 MED ORDER — ALBUTEROL SULFATE HFA 108 (90 BASE) MCG/ACT IN AERS
INHALATION_SPRAY | RESPIRATORY_TRACT | Status: DC | PRN
  Administered 2019-04-05: 6 via RESPIRATORY_TRACT

## 2019-04-05 MED ORDER — SODIUM CHLORIDE 0.9% IV SOLUTION: Freq: Once | INTRAVENOUS | Status: DC

## 2019-04-05 MED ORDER — DOCUSATE SODIUM 100 MG PO CAPS
200.0000 mg | ORAL_CAPSULE | Freq: Every day | ORAL | Status: DC
  Administered 2019-04-06 – 2019-04-09 (×4): 200 mg via ORAL
  Filled 2019-04-05 (×4): qty 2

## 2019-04-05 MED ORDER — PHENYLEPHRINE HCL-NACL 20-0.9 MG/250ML-% IV SOLN
0.0000 ug/min | INTRAVENOUS | Status: DC
  Administered 2019-04-05: 20:00:00 40 ug/min via INTRAVENOUS
  Administered 2019-04-06: 30 ug/min via INTRAVENOUS
  Filled 2019-04-05 (×2): qty 250

## 2019-04-05 MED ORDER — ACETAMINOPHEN 160 MG/5ML PO SOLN: 1000.0000 mg | Freq: Four times a day (QID) | ORAL | Status: DC

## 2019-04-05 MED ORDER — NITROGLYCERIN IN D5W 200-5 MCG/ML-% IV SOLN: 0.0000 ug/min | INTRAVENOUS | Status: DC

## 2019-04-05 MED ORDER — BISACODYL 5 MG PO TBEC
10.0000 mg | DELAYED_RELEASE_TABLET | Freq: Every day | ORAL | Status: DC
  Administered 2019-04-06 – 2019-04-09 (×4): 10 mg via ORAL
  Filled 2019-04-05 (×4): qty 2

## 2019-04-05 MED ORDER — ALBUMIN HUMAN 5 % IV SOLN
250.0000 mL | INTRAVENOUS | Status: AC | PRN
  Administered 2019-04-05 – 2019-04-06 (×2): 12.5 g via INTRAVENOUS

## 2019-04-05 MED ORDER — PROTAMINE SULFATE 10 MG/ML IV SOLN
INTRAVENOUS | Status: AC
  Filled 2019-04-05: qty 10

## 2019-04-05 MED ORDER — MIDAZOLAM HCL 5 MG/5ML IJ SOLN
INTRAMUSCULAR | Status: DC | PRN
  Administered 2019-04-05 (×2): 2 mg via INTRAVENOUS
  Administered 2019-04-05 (×2): 3 mg via INTRAVENOUS
  Administered 2019-04-05: 2 mg via INTRAVENOUS

## 2019-04-05 MED ORDER — SODIUM CHLORIDE (PF) 0.9 % IJ SOLN
OROMUCOSAL | Status: DC | PRN
  Administered 2019-04-05 (×3): 4 mL via TOPICAL

## 2019-04-05 MED ORDER — LACTATED RINGERS IV SOLN
INTRAVENOUS | Status: DC | PRN
  Administered 2019-04-05 (×2): via INTRAVENOUS

## 2019-04-05 MED ORDER — MIDAZOLAM HCL 2 MG/2ML IJ SOLN
INTRAMUSCULAR | Status: AC
  Filled 2019-04-05: qty 2

## 2019-04-05 MED ORDER — PROTAMINE SULFATE 10 MG/ML IV SOLN
INTRAVENOUS | Status: DC | PRN
  Administered 2019-04-05 (×2): 30 mg via INTRAVENOUS
  Administered 2019-04-05 (×2): 50 mg via INTRAVENOUS
  Administered 2019-04-05: 40 mg via INTRAVENOUS
  Administered 2019-04-05 (×3): 50 mg via INTRAVENOUS

## 2019-04-05 MED ORDER — PHENYLEPHRINE 40 MCG/ML (10ML) SYRINGE FOR IV PUSH (FOR BLOOD PRESSURE SUPPORT)
PREFILLED_SYRINGE | INTRAVENOUS | Status: AC
  Filled 2019-04-05: qty 10

## 2019-04-05 MED ORDER — MAGNESIUM SULFATE 4 GM/100ML IV SOLN
4.0000 g | Freq: Once | INTRAVENOUS | Status: AC
  Filled 2019-04-05: qty 100

## 2019-04-05 MED ORDER — PROTAMINE SULFATE 10 MG/ML IV SOLN
INTRAVENOUS | Status: AC
  Filled 2019-04-05: qty 25

## 2019-04-05 MED ORDER — ROCURONIUM BROMIDE 10 MG/ML (PF) SYRINGE
PREFILLED_SYRINGE | INTRAVENOUS | Status: DC | PRN
  Administered 2019-04-05 (×2): 50 mg via INTRAVENOUS
  Administered 2019-04-05 (×2): 100 mg via INTRAVENOUS

## 2019-04-05 MED ORDER — SODIUM BICARBONATE 4.2 % IV SOLN
INTRAVENOUS | Status: DC | PRN
  Administered 2019-04-05 (×2): 50 mL via INTRAVENOUS

## 2019-04-05 MED ORDER — ALBUMIN HUMAN 5 % IV SOLN
INTRAVENOUS | Status: DC | PRN
  Administered 2019-04-05 (×2): via INTRAVENOUS

## 2019-04-05 MED ORDER — NOREPINEPHRINE 4 MG/250ML-% IV SOLN
0.0000 ug/min | INTRAVENOUS | Status: DC
  Administered 2019-04-06: 3 ug/min via INTRAVENOUS
  Filled 2019-04-05: qty 250

## 2019-04-05 MED ORDER — LACTATED RINGERS IV SOLN
INTRAVENOUS | Status: DC
  Administered 2019-04-07: 900 mL via INTRAVENOUS

## 2019-04-05 MED ORDER — ASPIRIN EC 325 MG PO TBEC
325.0000 mg | DELAYED_RELEASE_TABLET | Freq: Every day | ORAL | Status: DC
  Administered 2019-04-06: 325 mg via ORAL
  Filled 2019-04-05: qty 1

## 2019-04-05 MED ORDER — CALCIUM CHLORIDE 10 % IV SOLN
INTRAVENOUS | Status: DC | PRN
  Administered 2019-04-05: 1 g via INTRAVENOUS

## 2019-04-05 MED ORDER — SODIUM CHLORIDE 0.9 % IV SOLN: 250.0000 mL | INTRAVENOUS | Status: DC

## 2019-04-05 MED ORDER — MILRINONE LACTATE IN DEXTROSE 20-5 MG/100ML-% IV SOLN
INTRAVENOUS | Status: DC | PRN
  Administered 2019-04-05: .3 ug/kg/min via INTRAVENOUS

## 2019-04-05 MED ORDER — 0.9 % SODIUM CHLORIDE (POUR BTL) OPTIME
TOPICAL | Status: DC | PRN
  Administered 2019-04-05: 5000 mL

## 2019-04-05 MED ORDER — OXYCODONE HCL 5 MG PO TABS
5.0000 mg | ORAL_TABLET | ORAL | Status: DC | PRN
  Administered 2019-04-06 – 2019-04-07 (×4): 10 mg via ORAL
  Administered 2019-04-07: 5 mg via ORAL
  Administered 2019-04-07 (×2): 10 mg via ORAL
  Administered 2019-04-07: 5 mg via ORAL
  Administered 2019-04-08 (×4): 10 mg via ORAL
  Administered 2019-04-08 – 2019-04-09 (×2): 5 mg via ORAL
  Administered 2019-04-09: 10 mg via ORAL
  Filled 2019-04-05 (×3): qty 2
  Filled 2019-04-05 (×2): qty 1
  Filled 2019-04-05 (×9): qty 2
  Filled 2019-04-05: qty 1

## 2019-04-05 MED ORDER — CHLORHEXIDINE GLUCONATE CLOTH 2 % EX PADS
6.0000 | MEDICATED_PAD | Freq: Every day | CUTANEOUS | Status: DC
  Administered 2019-04-07 – 2019-04-08 (×2): 6 via TOPICAL

## 2019-04-05 MED ORDER — ROCURONIUM BROMIDE 10 MG/ML (PF) SYRINGE
PREFILLED_SYRINGE | INTRAVENOUS | Status: AC
  Filled 2019-04-05: qty 10

## 2019-04-05 MED ORDER — PROPOFOL 10 MG/ML IV BOLUS
INTRAVENOUS | Status: AC
  Filled 2019-04-05: qty 20

## 2019-04-05 MED ORDER — SODIUM BICARBONATE 8.4 % IV SOLN
50.0000 meq | Freq: Once | INTRAVENOUS | Status: AC
  Administered 2019-04-05: 18:00:00 50 meq via INTRAVENOUS

## 2019-04-05 MED ORDER — POTASSIUM CHLORIDE 10 MEQ/50ML IV SOLN
10.0000 meq | INTRAVENOUS | Status: AC
  Administered 2019-04-05 (×3): 10 meq via INTRAVENOUS
  Filled 2019-04-05: qty 50

## 2019-04-05 MED ORDER — HEPARIN SODIUM (PORCINE) 1000 UNIT/ML IJ SOLN
INTRAMUSCULAR | Status: DC | PRN
  Administered 2019-04-05: 36000 [IU] via INTRAVENOUS

## 2019-04-05 MED ORDER — ASPIRIN 81 MG PO CHEW: 324.0000 mg | CHEWABLE_TABLET | Freq: Every day | ORAL | Status: DC

## 2019-04-05 MED ORDER — SODIUM CHLORIDE 0.9 % IV SOLN
INTRAVENOUS | Status: DC | PRN
  Administered 2019-04-05: 0.2 ug/kg/h via INTRAVENOUS

## 2019-04-05 SURGICAL SUPPLY — 150 items
ADAPTER CARDIO PERF ANTE/RETRO (ADAPTER) ×4 IMPLANT
APPLICATOR COTTON TIP 6 STRL (MISCELLANEOUS) ×3 IMPLANT
APPLICATOR COTTON TIP 6IN STRL (MISCELLANEOUS) ×4
ATTRACTOMAT 16X20 MAGNETIC DRP (DRAPES) ×4 IMPLANT
BAG DECANTER FOR FLEXI CONT (MISCELLANEOUS) ×8 IMPLANT
BLADE SURG 11 STRL SS (BLADE) ×4 IMPLANT
BLADE SURG 15 STRL LF DISP TIS (BLADE) ×6 IMPLANT
BLADE SURG 15 STRL SS (BLADE) ×2
BLOOD HAEMOCONCENTR 700 MIDI (MISCELLANEOUS) ×4 IMPLANT
BRUSH SCRUB EZ PLAIN DRY (MISCELLANEOUS) ×16 IMPLANT
CANISTER SUCT 3000ML PPV (MISCELLANEOUS) ×8 IMPLANT
CANNULA AORTIC ROOT 9FR (CANNULA) ×4 IMPLANT
CANNULA EZ GLIDE AORTIC 21FR (CANNULA) ×4 IMPLANT
CANNULA FEM VENOUS REMOTE 22FR (CANNULA) ×4 IMPLANT
CANNULA FEMORAL ART 14 SM (MISCELLANEOUS) ×4 IMPLANT
CANNULA GUNDRY RCSP 15FR (MISCELLANEOUS) ×4 IMPLANT
CANNULA OPTISITE PERFUSION 16F (CANNULA) IMPLANT
CANNULA OPTISITE PERFUSION 18F (CANNULA) ×4 IMPLANT
CANNULA SUMP PERICARDIAL (CANNULA) ×8 IMPLANT
CATH CPB KIT OWEN (MISCELLANEOUS) IMPLANT
CATH ROBINSON RED A/P 18FR (CATHETERS) ×4 IMPLANT
CATH THORACIC 36FR (CATHETERS) ×4 IMPLANT
CONN ST 1/4X3/8  BEN (MISCELLANEOUS) ×3
CONN ST 1/4X3/8 BEN (MISCELLANEOUS) ×9 IMPLANT
CONNECTOR 1/2X3/8X1/2 3 WAY (MISCELLANEOUS) ×1
CONNECTOR 1/2X3/8X1/2 3WAY (MISCELLANEOUS) ×3 IMPLANT
CONT SPEC 4OZ CLIKSEAL STRL BL (MISCELLANEOUS) ×20 IMPLANT
COVER BACK TABLE 24X17X13 BIG (DRAPES) ×4 IMPLANT
COVER PROBE W GEL 5X96 (DRAPES) ×4 IMPLANT
COVER WAND RF STERILE (DRAPES) IMPLANT
DERMABOND ADVANCED (GAUZE/BANDAGES/DRESSINGS) ×1
DERMABOND ADVANCED .7 DNX12 (GAUZE/BANDAGES/DRESSINGS) ×3 IMPLANT
DEVICE CLOSURE PERCLS PRGLD 6F (VASCULAR PRODUCTS) ×9 IMPLANT
DEVICE SUT CK QUICK LOAD MINI (Prosthesis & Implant Heart) ×16 IMPLANT
DEVICE TROCAR PUNCTURE CLOSURE (ENDOMECHANICALS) ×4 IMPLANT
DRAIN CHANNEL 28F RND 3/8 FF (WOUND CARE) ×8 IMPLANT
DRAIN CHANNEL 32F RND 10.7 FF (WOUND CARE) ×12 IMPLANT
DRAPE C-ARM 42X72 X-RAY (DRAPES) ×4 IMPLANT
DRAPE CV SPLIT W-CLR ANES SCRN (DRAPES) ×4 IMPLANT
DRAPE INCISE 13X13 STRL (DRAPES) ×4 IMPLANT
DRAPE INCISE 23X17 IOBAN STRL (DRAPES) ×3
DRAPE INCISE IOBAN 23X17 STRL (DRAPES) ×9 IMPLANT
DRAPE INCISE IOBAN 66X45 STRL (DRAPES) ×4 IMPLANT
DRAPE PERI GROIN 82X75IN TIB (DRAPES) ×4 IMPLANT
DRAPE SLUSH/WARMER DISC (DRAPES) ×4 IMPLANT
DRSG AQUACEL AG ADV 3.5X14 (GAUZE/BANDAGES/DRESSINGS) ×4 IMPLANT
DRSG COVADERM 4X8 (GAUZE/BANDAGES/DRESSINGS) IMPLANT
ELECT BLADE 4.0 EZ CLEAN MEGAD (MISCELLANEOUS) ×4
ELECT BLADE 6.5 EXT (BLADE) ×4 IMPLANT
ELECT REM PT RETURN 9FT ADLT (ELECTROSURGICAL) ×8
ELECTRODE BLDE 4.0 EZ CLN MEGD (MISCELLANEOUS) ×3 IMPLANT
ELECTRODE REM PT RTRN 9FT ADLT (ELECTROSURGICAL) ×6 IMPLANT
FELT TEFLON 1X6 (MISCELLANEOUS) ×8 IMPLANT
FEMORAL VENOUS CANN RAP (CANNULA) IMPLANT
GAUZE SPONGE 4X4 12PLY STRL (GAUZE/BANDAGES/DRESSINGS) ×4 IMPLANT
GAUZE SPONGE 4X4 12PLY STRL LF (GAUZE/BANDAGES/DRESSINGS) ×4 IMPLANT
GLOVE BIO SURGEON STRL SZ 6.5 (GLOVE) ×16 IMPLANT
GLOVE BIO SURGEON STRL SZ7 (GLOVE) ×4 IMPLANT
GLOVE ORTHO TXT STRL SZ7.5 (GLOVE) ×12 IMPLANT
GLOVE SURG SS PI 7.5 STRL IVOR (GLOVE) ×4 IMPLANT
GOWN STRL REUS W/ TWL LRG LVL3 (GOWN DISPOSABLE) ×18 IMPLANT
GOWN STRL REUS W/TWL LRG LVL3 (GOWN DISPOSABLE) ×6
HEMOSTAT POWDER SURGIFOAM 1G (HEMOSTASIS) ×12 IMPLANT
INSERT FOGARTY XLG (MISCELLANEOUS) ×4 IMPLANT
IV NS IRRIG 3000ML ARTHROMATIC (IV SOLUTION) ×4 IMPLANT
KIT BASIN OR (CUSTOM PROCEDURE TRAY) ×4 IMPLANT
KIT DEVICE SUT COR-KNOT MIS 5 (INSTRUMENTS) ×4 IMPLANT
KIT DILATOR VASC 18G NDL (KITS) ×4 IMPLANT
KIT DRAINAGE VACCUM ASSIST (KITS) ×4 IMPLANT
KIT SUCTION CATH 14FR (SUCTIONS) ×12 IMPLANT
KIT SUT CK MINI COMBO 4X17 (Prosthesis & Implant Heart) ×4 IMPLANT
KIT TURNOVER KIT B (KITS) ×4 IMPLANT
LEAD PACING MYOCARDI (MISCELLANEOUS) ×4 IMPLANT
LINE VENT (MISCELLANEOUS) ×4 IMPLANT
LOOP VESSEL SUPERMAXI WHITE (MISCELLANEOUS) ×4 IMPLANT
NEEDLE AORTIC ROOT 14G 7F (CATHETERS) ×4 IMPLANT
NS IRRIG 1000ML POUR BTL (IV SOLUTION) ×20 IMPLANT
PACK E MIN INVASIVE VALVE (SUTURE) ×4 IMPLANT
PACK OPEN HEART (CUSTOM PROCEDURE TRAY) ×4 IMPLANT
PAD ARMBOARD 7.5X6 YLW CONV (MISCELLANEOUS) ×8 IMPLANT
PAD ELECT DEFIB RADIOL ZOLL (MISCELLANEOUS) ×4 IMPLANT
PERCLOSE PROGLIDE 6F (VASCULAR PRODUCTS) ×12
POSITIONER HEAD DONUT 9IN (MISCELLANEOUS) ×4 IMPLANT
RING TRICUSPID T28 (Prosthesis & Implant Heart) ×4 IMPLANT
SET CANNULATION TOURNIQUET (MISCELLANEOUS) ×4 IMPLANT
SET CARDIOPLEGIA MPS 5001102 (MISCELLANEOUS) ×4 IMPLANT
SET IRRIG TUBING LAPAROSCOPIC (IRRIGATION / IRRIGATOR) ×4 IMPLANT
SET MICROPUNCTURE 5F STIFF (MISCELLANEOUS) ×4 IMPLANT
SHEATH BRITE TIP 8FR 35CM (SHEATH) ×8 IMPLANT
SOLUTION ANTI FOG 6CC (MISCELLANEOUS) ×4 IMPLANT
SPONGE LAP 18X18 RF (DISPOSABLE) ×4 IMPLANT
SPONGE LAP 18X18 X RAY DECT (DISPOSABLE) ×8 IMPLANT
SPONGE LAP 4X18 RFD (DISPOSABLE) ×4 IMPLANT
SUT BONE WAX W31G (SUTURE) ×4 IMPLANT
SUT ETHIBON 2 0 V 52N 30 (SUTURE) ×8 IMPLANT
SUT ETHIBOND (SUTURE) ×8 IMPLANT
SUT ETHIBOND 2 0 SH (SUTURE) ×13 IMPLANT
SUT ETHIBOND 2 0 SH 36X2 (SUTURE) ×3 IMPLANT
SUT ETHIBOND 2 0 V4 (SUTURE) IMPLANT
SUT ETHIBOND 2 0V4 GREEN (SUTURE) IMPLANT
SUT ETHIBOND 2-0 RB-1 WHT (SUTURE) ×8 IMPLANT
SUT ETHIBOND 4 0 TF (SUTURE) IMPLANT
SUT ETHIBOND 5 0 C 1 30 (SUTURE) IMPLANT
SUT ETHIBOND NAB MH 2-0 36IN (SUTURE) ×4 IMPLANT
SUT ETHIBOND X763 2 0 SH 1 (SUTURE) ×12 IMPLANT
SUT GORETEX 6.0 TH-9 30 IN (SUTURE) IMPLANT
SUT GORETEX CV 4 TH 22 36 (SUTURE) ×4 IMPLANT
SUT GORETEX CV-5THC-13 36IN (SUTURE) IMPLANT
SUT GORETEX CV4 TH-18 (SUTURE) IMPLANT
SUT GORETEX TH-18 36 INCH (SUTURE) IMPLANT
SUT MNCRL AB 3-0 PS2 18 (SUTURE) ×8 IMPLANT
SUT PDS AB 1 CTX 36 (SUTURE) ×8 IMPLANT
SUT PROLENE 3 0 SH DA (SUTURE) ×16 IMPLANT
SUT PROLENE 3 0 SH1 36 (SUTURE) ×28 IMPLANT
SUT PROLENE 4 0 RB 1 (SUTURE) ×5
SUT PROLENE 4 0 SH DA (SUTURE) ×16 IMPLANT
SUT PROLENE 4-0 RB1 .5 CRCL 36 (SUTURE) ×15 IMPLANT
SUT PROLENE 5 0 C 1 36 (SUTURE) ×16 IMPLANT
SUT PROLENE 6 0 C 1 30 (SUTURE) ×12 IMPLANT
SUT SILK  1 MH (SUTURE) ×7
SUT SILK 1 MH (SUTURE) ×21 IMPLANT
SUT SILK 1 TIES 10X30 (SUTURE) ×4 IMPLANT
SUT SILK 2 0 SH CR/8 (SUTURE) ×4 IMPLANT
SUT SILK 2 0 TIES 10X30 (SUTURE) ×4 IMPLANT
SUT SILK 2 0SH CR/8 30 (SUTURE) ×8 IMPLANT
SUT SILK 3 0 (SUTURE) ×1
SUT SILK 3 0 SH CR/8 (SUTURE) ×4 IMPLANT
SUT SILK 3 0SH CR/8 30 (SUTURE) ×4 IMPLANT
SUT SILK 3-0 18XBRD TIE 12 (SUTURE) ×3 IMPLANT
SUT TEM PAC WIRE 2 0 SH (SUTURE) ×16 IMPLANT
SUT VIC AB 2-0 CTX 36 (SUTURE) ×8 IMPLANT
SUT VIC AB 2-0 UR6 27 (SUTURE) ×8 IMPLANT
SUT VIC AB 3-0 SH 8-18 (SUTURE) ×12 IMPLANT
SUT VICRYL 2 TP 1 (SUTURE) ×4 IMPLANT
SYR 10ML LL (SYRINGE) ×4 IMPLANT
SYSTEM SAHARA CHEST DRAIN ATS (WOUND CARE) ×4 IMPLANT
TAPE CLOTH SURG 4X10 WHT LF (GAUZE/BANDAGES/DRESSINGS) ×4 IMPLANT
TAPE PAPER 2X10 WHT MICROPORE (GAUZE/BANDAGES/DRESSINGS) ×4 IMPLANT
TOWEL GREEN STERILE (TOWEL DISPOSABLE) ×4 IMPLANT
TOWEL GREEN STERILE FF (TOWEL DISPOSABLE) ×4 IMPLANT
TRAY FOLEY SLVR 16FR TEMP STAT (SET/KITS/TRAYS/PACK) ×4 IMPLANT
TROCAR XCEL BLADELESS 5X75MML (TROCAR) ×4 IMPLANT
TROCAR XCEL NON-BLD 11X100MML (ENDOMECHANICALS) ×8 IMPLANT
TUBE SUCT INTRACARD DLP 20F (MISCELLANEOUS) ×4 IMPLANT
TUNNELER SHEATH ON-Q 11GX8 DSP (PAIN MANAGEMENT) IMPLANT
UNDERPAD 30X30 (UNDERPADS AND DIAPERS) ×4 IMPLANT
VALVE MITRAL 29MM (Prosthesis & Implant Heart) ×4 IMPLANT
WATER STERILE IRR 1000ML POUR (IV SOLUTION) ×8 IMPLANT
WIRE EMERALD 3MM-J .035X150CM (WIRE) ×8 IMPLANT
YANKAUER SUCT BULB TIP NO VENT (SUCTIONS) IMPLANT

## 2019-04-05 NOTE — Progress Notes (Signed)
CT surgery p.m. Rounds   Status post redo mitral valve replacement tricuspid valve ring Non-paced sinus rhythm with cardiac index 1.9 Patient currently on 100% FiO2 and ventilator rate and tidal volume increased for hypercarbia-follow-up blood gas at 8 PM Patient on inhaled nitric oxide for pulmonary hypertension-RV function 30 parts/min Chest tube output minimal, good urine output Postop chest x-ray wet We will try to wean FiO2 tonight and possibly start nitric oxide wean- will follow CVP and cardiac index.

## 2019-04-05 NOTE — Transfer of Care (Signed)
Immediate Anesthesia Transfer of Care Note  Patient: Theresa Crawford  Procedure(s) Performed: TRANSESOPHAGEAL ECHOCARDIOGRAM (TEE) (N/A ) Mitral Valve (Mv) Replacement using Carbomedics Optiform Valve size 16mm (N/A Chest) Tricuspid Valve Repair using Edwards MC3 Tricuspid ring size 49mm (N/A Chest)  Patient Location: ICU  Anesthesia Type:General  Level of Consciousness: Patient remains intubated per anesthesia plan  Airway & Oxygen Therapy: Patient remains intubated per anesthesia plan and Patient placed on Ventilator (see vital sign flow sheet for setting)  Post-op Assessment: Report given to RN and Post -op Vital signs reviewed and stable  Post vital signs: Reviewed and stable  Last Vitals:  Vitals Value Taken Time  BP 126/74 04/05/19 1611  Temp 36.3 C 04/05/19 1634  Pulse 78 04/05/19 1634  Resp 12 04/05/19 1634  SpO2 100 % 04/05/19 1634  Vitals shown include unvalidated device data.  Last Pain:  Vitals:   04/05/19 0650  TempSrc:   PainSc: 0-No pain      Patients Stated Pain Goal: 3 (76/18/48 [id number])  Complications: No apparent anesthesia complications

## 2019-04-05 NOTE — Anesthesia Procedure Notes (Signed)
Procedure Name: Intubation Date/Time: 04/05/2019 7:50 AM Performed by: Celica Kotowski T, CRNA Pre-anesthesia Checklist: Patient identified, Emergency Drugs available, Suction available and Patient being monitored Patient Re-evaluated:Patient Re-evaluated prior to induction Oxygen Delivery Method: Circle system utilized Preoxygenation: Pre-oxygenation with 100% oxygen Induction Type: IV induction Ventilation: Mask ventilation without difficulty Laryngoscope Size: Miller and 2 Grade View: Grade I Endobronchial tube: Left, Double lumen EBT, EBT position confirmed by auscultation and EBT position confirmed by fiberoptic bronchoscope and 37 Fr Number of attempts: 1 Airway Equipment and Method: Patient positioned with wedge pillow and Stylet Placement Confirmation: ETT inserted through vocal cords under direct vision,  positive ETCO2 and breath sounds checked- equal and bilateral Secured at: 29 cm Tube secured with: Tape Dental Injury: Teeth and Oropharynx as per pre-operative assessment

## 2019-04-05 NOTE — Brief Op Note (Addendum)
04/05/2019  1:57 PM  PATIENT:  Cameron Ali  38 y.o. 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  PROCEDURE:  Procedure(s): TRANSESOPHAGEAL ECHOCARDIOGRAM (TEE) (N/A) -Mitral Valve (Mv) Replacement using Carbomedics Optiform Valve size 29   -Tricuspid Valve Repair with an Edwards T28 MC3 Annuloplasty Ring  -Mediastinal Lymph Node Biopsy  SURGEON:  Rexene Alberts, MD   PHYSICIAN ASSISTANT: Roddenberry    ANESTHESIA:   general  EBL: 1100 mL  BLOOD ADMINISTERED:none  DRAINS: Mediastinal drains   LOCAL MEDICATIONS USED:  NONE  SPECIMEN:  Source of Specimen:  Mitral valve leaflets  DISPOSITION OF SPECIMEN:  PATHOLOGY  COUNTS:  YES  DICTATION: .Dragon Dictation  PLAN OF CARE: Admit to inpatient   PATIENT DISPOSITION:  ICU - intubated and hemodynamically stable.   Delay start of Pharmacological VTE agent (>24hrs) due to surgical blood loss or risk of bleeding: yes

## 2019-04-05 NOTE — Anesthesia Procedure Notes (Signed)
Central Venous Catheter Insertion Performed by: Roberts Gaudy, MD, anesthesiologist Start/End7/23/2020 8:05 AM, 04/05/2019 8:15 AM Patient location: OR. Position: supine Hand hygiene performed , maximum sterile barriers used  and Seldinger technique used Catheter size: 9 Fr Procedure performed using ultrasound guided technique. Ultrasound Notes:anatomy identified, needle tip was noted to be adjacent to the nerve/plexus identified and no ultrasound evidence of intravascular and/or intraneural injection Attempts: 1 Following insertion, dressing applied, Biopatch and line sutured. Post procedure assessment: blood return through all ports, free fluid flow and no air

## 2019-04-05 NOTE — Plan of Care (Signed)
  Problem: Cardiac: Goal: Will achieve and/or maintain hemodynamic stability 04/05/2019 2331 by Reinaldo Berber, RN Outcome: Progressing 04/05/2019 2330 by Reinaldo Berber, RN Outcome: Progressing   Problem: Clinical Measurements: Goal: Postoperative complications will be avoided or minimized 04/05/2019 2331 by Reinaldo Berber, RN Outcome: Progressing 04/05/2019 2330 by Reinaldo Berber, RN Outcome: Progressing   Problem: Respiratory: Goal: Respiratory status will improve 04/05/2019 2331 by Reinaldo Berber, RN Outcome: Progressing 04/05/2019 2330 by Reinaldo Berber, RN Outcome: Progressing   Problem: Skin Integrity: Goal: Wound healing without signs and symptoms of infection 04/05/2019 2331 by Reinaldo Berber, RN Outcome: Progressing 04/05/2019 2330 by Reinaldo Berber, RN Outcome: Progressing Goal: Risk for impaired skin integrity will decrease 04/05/2019 2331 by Reinaldo Berber, RN Outcome: Progressing 04/05/2019 2330 by Reinaldo Berber, RN Outcome: Progressing   Problem: Clinical Measurements: Goal: Ability to maintain clinical measurements within normal limits will improve 04/05/2019 2331 by Reinaldo Berber, RN Outcome: Progressing 04/05/2019 2330 by Reinaldo Berber, RN Outcome: Progressing Goal: Will remain free from infection 04/05/2019 2331 by Reinaldo Berber, RN Outcome: Progressing 04/05/2019 2330 by Reinaldo Berber, RN Outcome: Progressing Goal: Diagnostic test results will improve 04/05/2019 2331 by Reinaldo Berber, RN Outcome: Progressing 04/05/2019 2330 by Reinaldo Berber, RN Outcome: Progressing Goal: Respiratory complications will improve 04/05/2019 2331 by Reinaldo Berber, RN Outcome: Progressing 04/05/2019 2330 by Reinaldo Berber, RN Outcome: Progressing Goal: Cardiovascular complication will be avoided 04/05/2019 2331 by Reinaldo Berber, RN Outcome: Progressing 04/05/2019 2330 by Reinaldo Berber, RN Outcome: Progressing    Problem: Coping: Goal: Level of anxiety will decrease 04/05/2019 2331 by Reinaldo Berber, RN Outcome: Progressing 04/05/2019 2330 by Reinaldo Berber, RN Outcome: Progressing   Problem: Elimination: Goal: Will not experience complications related to bowel motility Outcome: Progressing   Problem: Pain Managment: Goal: General experience of comfort will improve 04/05/2019 2331 by Reinaldo Berber, RN Outcome: Progressing 04/05/2019 2330 by Reinaldo Berber, RN Outcome: Progressing   Problem: Safety: Goal: Ability to remain free from injury will improve 04/05/2019 2331 by Reinaldo Berber, RN Outcome: Progressing 04/05/2019 2330 by Reinaldo Berber, RN Outcome: Progressing

## 2019-04-05 NOTE — Interval H&P Note (Signed)
History and Physical Interval Note:  04/05/2019 5:20 AM  Theresa Crawford  has presented today for surgery, with the diagnosis of MV STENOSIS MR TR.  The various methods of treatment have been discussed with the patient and family. After consideration of risks, benefits and other options for treatment, the patient has consented to  Procedure(s): MINIMALLY INVASIVE REDO MITRAL VALVE (MV) REPLACEMENT (Right) MINIMALLY INVASIVE TRICUSPID VALVE REPAIR (Right) TRANSESOPHAGEAL ECHOCARDIOGRAM (TEE) (N/A) as a surgical intervention.  The patient's history has been reviewed, patient examined, no change in status, stable for surgery.  I have reviewed the patient's chart and labs.  Questions were answered to the patient's satisfaction.     Rexene Alberts

## 2019-04-05 NOTE — Anesthesia Procedure Notes (Signed)
Central Venous Catheter Insertion Performed by: Roberts Gaudy, MD, anesthesiologist Start/End7/23/2020 8:05 AM, 04/05/2019 8:15 AM Patient location: OR. Preanesthetic checklist: patient identified, IV checked, site marked, risks and benefits discussed, surgical consent, monitors and equipment checked, pre-op evaluation, timeout performed and anesthesia consent Hand hygiene performed  and maximum sterile barriers used  PA cath was placed.Swan type:thermodilution Procedure performed without using ultrasound guided technique. Attempts: 1 Following insertion, Biopatch, dressing applied and line sutured. Post procedure assessment: blood return through all ports, free fluid flow and no air  Patient tolerated the procedure well with no immediate complications.

## 2019-04-05 NOTE — Anesthesia Procedure Notes (Signed)
Procedure Name: Intubation Date/Time: 04/05/2019 8:39 AM Performed by: Shantoya Geurts T, CRNA Pre-anesthesia Checklist: Patient identified, Emergency Drugs available, Suction available and Patient being monitored Patient Re-evaluated:Patient Re-evaluated prior to induction Oxygen Delivery Method: Circle system utilized Preoxygenation: Pre-oxygenation with 100% oxygen Tube type: Oral Tube size: 8.0 mm Number of attempts: 1 Airway Equipment and Method: Patient positioned with wedge pillow and Bougie stylet Placement Confirmation: ETT inserted through vocal cords under direct vision,  positive ETCO2 and breath sounds checked- equal and bilateral Secured at: 22 cm Tube secured with: Tape Dental Injury: Teeth and Oropharynx as per pre-operative assessment

## 2019-04-05 NOTE — Op Note (Signed)
CARDIOTHORACIC SURGERY OPERATIVE NOTE  Date of Procedure:  04/05/2019  Preoperative Diagnosis:   Mitral Stenosis and Recurrent Mitral Regurgitation S/P Mitral Valve Repair  Tricuspid Regurgitation  Pulmonary Hypertension  Sarcoidosis  Postoperative Diagnosis: Same   Procedure:    Mitral Valve Replacement  Redo median sternotomy  Sorin Carbomedics Optiform bileaflet mechanical valve (size 29mm, catalog #[id number], serial #[id number])   Tricuspid Valve Repair  Edwards mc3 ring annuloplasty (size 28mm, catalog #[id number], serial #[id number])   Biopsy of Anterior Mediastinal Lymph Node   Surgeon: Salvatore Decentlarence H. Cornelius Moraswen, MD  Assistant: Jillyn HiddenMyron Roddenberry, PA-C  Anesthesia: Kipp Broodavid Joslin, MD  Operative Findings:  Rheumatic heart disease  Moderate-severe mitral stenosis  Type IIIA mitral valve dysfunction with moderate mitral regurgitation  Moderate tricuspid regurgiation  Normal LV systolic function  Severe pulmonary hypertension  RV hypertrophy and chamber enlargement due to pressure overload  Diffuse chronic inflammation throughout both lungs and mediastinus  Severe lymphadenopathy                  BRIEF CLINICAL NOTE AND INDICATIONS FOR SURGERY  Patient is an obese 38 year old African-American female with long standing history of mitral valve disease and chronic diastolic congestive heart failure status post mitral valve repair in 2014 who returns to the office today with tentative plans to proceed with redo mitral valve replacement and tricuspid valve repair later this week.  Patient states that her heart disease began approximately10years ago when she was pregnant with her second child. She states that she developed pericarditis and congestive heart failure. She was eventually diagnosed with mitral valve disease and sarcoidosis. She underwent mitral valve repair in 2014. At the time she lived in [place].By report her surgical repair included  "resuspension of the posterior leaflet" and placement of a complete, semi-rigid CE Physio annuloplasty ring, size 30 mm.Following surgery she states that she quickly began to experience worsening problems of congestive heart failure. Within a year following surgery she had been hospitalized on several occasions with acute exacerbation of chronic diastolic congestive heart failure. She was referred back to her cardiac surgeon and the possibility of redo surgery was discussed. She moved to [place] in January 2018. She has been followed intermittently since then by Dr. Purvis SheffieldKoneswaran, and she has been hospitalized on numerous occasions with acute exacerbation of chronic diastolic congestive heart failure and pulmonary edema. Follow-up echocardiograms have documented the presence of normal left ventricular systolic function with at least mild mitral regurgitation and increased transvalvular gradients consistent with moderate to severe mitral stenosis.I had the opportunity to see her in consultation on 03/20/2018 and we discussed the indications, risks and potential benefits of redo mitral valve repair or replacement with possible tricuspid valve repair. Continued medical therapy was discussed as an alternative, particularly given the likelihood that her mitral valve would need to be replaced at the time of surgery. She expressed interest in proceeding with further diagnostic testing and surgery and was referred for dental clearance and diagnostic cardiac catheterization. She underwent dental extraction and was scheduled for catheterization, but she cancelled at the last minute. By report she had a "breakdown" and [facility] with suicidal ideation.  Ultimately the patient's psychiatric problems were brought under improved control.  Over the past 6 months the patient has had 3 different hospitalizations for acute exacerbation of shortness of breath.  1 month ago  she was hospitalized with severe resting shortness of breath and hypoxemic respiratory failure with pulmonary edema on chest x-ray.  I had  the opportunity to see her in follow-up at that time.  She was also evaluated by Dr. Tonia BroomsIcard from pulmonary medicine regarding likely diagnosis of sarcoidosis.  She did well with diuretic therapy and was discharged from the hospital with plans to proceed with elective surgical intervention this month.  However, the patient was readmitted to the hospital again March 24, 2019 with another acute exacerbation of resting shortness of breath with increased cough.  She was evaluated by the advanced heart failure team and underwent repeat right heart catheterization by Dr. Shirlee LatchMcLean.  She was also seen by the pharmacy team and counseled regarding what it would be like for her to be treated using warfarin for long-term anticoagulation.  Plans were made to proceed with elective redo mitral valve replacement and tricuspid valve repair later this week.  She returns to the office today with her mother present for follow-up consultation.  She reports no new problems or complaints.  Her breathing has been stable since hospital discharge.  Her weight is been stable.  She still has an intermittent dry nonproductive cough.  She denies fevers or chills.  She has been practicing social distancing and wearing a facemask whenever out of the house.  Patient is single and lives with her mother in [place]. She is unemployed. She has 2 children that do not live with her, ages 3815 and 1810. She describes chronic symptoms of exertional shortness of breath, orthopnea, lower extremity edema, and a chronic cough which have waxed and waned in severity but she states began immediately after her surgery in 2014. She has been hospitalized on numerous occasions for acute exacerbations of chronic congestive heart failure and symptoms always improve following intravenous diuresis. She states that recently at home  she has been taking Bumex regularly but despite this she has problems with fluid retention. She has chronic cough with occasional episodes of frank mopped assist. She reports some occasional tightness across her chest which is typically transient and fleeting and not necessarily associated with shortness of breath. Tightness in her chest is not related to physical exertion. She admits to problems with chronic anxiety including the fact that she was recently hospitalized at behavioral health because of extreme anxiety with suicidal ideation. She states that she quit smoking approximately 1 month ago and she denies any illicit drug use.  The patient and her mother have been seen in consultation and counseled at length regarding the indications, risks and potential benefits of surgery.  The need for mitral valve replacement was discussed including the fact that at her age use of a mechanical prosthetic valve would be recommended but require lifelong anticoagulation using warfarin.  The patient was seen in consultation by the pharmacy team during her most recent hospitalization and a brief trial of oral warfarin anticoagulation was initiated.  The patient and her mother both feel that she can remain compliant with long-term warfarin anticoagulation.  All questions have been answered, and the patient provides full informed consent for the operation as described.    DETAILS OF THE OPERATIVE PROCEDURE  Preparation:  The patient is brought to the operating room on the above mentioned date and central monitoring was established by the anesthesia team including placement of Swan-Ganz catheter and radial arterial line.  There is severe pulmonary hypertension at baseline.  The patient is placed in the supine position on the operating table.  Intravenous antibiotics are administered. General endotracheal anesthesia is induced.  Initially the patient is intubated using a dual-lumen endotracheal tube.  However,  the  patient is notably hypoxemic at baseline.  A portable chest x-ray is obtained demonstrating mild diffuse opacity consistent with congestive heart failure on top of chronic lung disease.  A decision was made to abort plans for possible minimally invasive approach for surgery and conventional sternotomy is planned.  The patient is reintubated using a single-lumen endotracheal tube.  A Foley catheter is placed.  Baseline transesophageal echocardiogram was performed.  Findings were notable for presence of previous ring annuloplasty in the mitral position with moderately severe mitral stenosis and moderate mitral regurgitation.  The mitral valve leaflets are somewhat thickened and demonstrate restricted leaflet mobility.  There is severe left atrial enlargement.  There is normal left ventricular systolic function.  The right ventricle is dilated and hypertrophied from pressure overload.  The tricuspid annulus is slightly dilated.  There is moderate tricuspid regurgitation.  The patient's chest, abdomen, both groins, and both lower extremities are prepared and draped in a sterile manner. A time out procedure is performed.   Percutaneous Vascular Access:  Percutaneous venous access were obtained on the right side.  Using ultrasound guidance the right common femoral vein was cannulated using the Seldinger technique and a pair of Perclose vascular closure devises were placed, after which time an 8 French sheath inserted.   The right internal jugular vein was cannulated  using ultrasound guidance and an 8 French sheath inserted.     Surgical Approach:  A redo median sternotomy incision was performed.  The previous sternal wires are removed.  The sternum was divided using a sagittal saw.  Sternal reentry is uneventful.  Electrocautery was utilized to dissect the structures of the mediastinum away from the posterior table of the sternum.  Dissection is continued in both directions until both pleural spaces are  opened widely.  During dissection the left internal mammary artery and vein are ligated.   Biopsy of Anterior Mediastinal Lymph Node:  A retractor is placed.  Dissection is now begun to expose the structures of the anterior mediastinum and identify the ascending aorta.  Dissection is extremely tedious due to diffuse chronic inflammation.  There are multiple enlarged lymph nodes in the anterior mediastinum immediately anterior to the ascending aorta.  These lymph nodes are excised and sent to pathology for both frozen section and permanent histology to rule out the possibility of sarcoidosis.   Extracorporeal Cardiopulmonary Bypass and Myocardial Protection:  Dissection is continued to expose the anterior surface of the ascending aorta.  The ascending aorta is relatively small in caliber and normal in appearance.  The patient was heparinized systemically.  The right common femoral vein is cannulated through the venous sheath and a guidewire advanced into the right atrium using TEE guidance.  The femoral vein cannulated using a 22 Fr long femoral venous cannula.   The right internal jugular vein is cannulated through the venous sheath and a guidewire advanced into the right atrium.  The internal jugular vein is cannulated using a 14 French pediatric femoral venous cannula.   The ascending aorta is cannulated using a Seldinger technique with an 82 French arterial cannula.  Adequate heparinization is verified.   The entire pre-bypass portion of the operation was notable for stable hemodynamics.  Cardiopulmonary bypass was begun.  A combination of sharp dissection and electrocautery is subsequently utilized to dissect thet heart circumferentially.  This dissection is notably quite tedious and takes an extended period of time because of dense diffuse adhesions surrounding the entire epicardial space.  Ultimately the entire right  atrium and the inferior wall of the right ventricle are completely mobilized.   The intra-atrial groove is mobilized and identified.  Both the superior vena cava and inferior vena cava are dissected circumferentially and encircled using umbilical tapes.  A retrograde cardioplegia cannula is placed through the right atrium into the coronary sinus.  A cardioplegia cannula is placed in the ascending aorta.  The operative field is continuously flooded with carbon dioxide.  The patient is cooled to 32C systemic temperature.  The aortic cross clamp is applied and cardioplegia is delivered initially in an antegrade fashion through the aortic root using modified [place] cold blood cardioplegia (Kennestone blood cardioplegia protocol).  Supplemental cardioplegia administered retrograde through the coronary sinus catheter.   The initial cardioplegic arrest is rapid with early diastolic arrest.  Myocardial protection was felt to be excellent.   Mitral Valve Replacement:  A left atriotomy incision was performed through the interatrial groove and extended partially across the back wall of the left atrium after opening the oblique sinus inferiorly.  The mitral valve is exposed using a self-retaining retractor.  The mitral valve was inspected and notable for previous ring annuloplasty that remained intact with no sign of ring dehiscence.  Both the anterior and posterior leaflets of the mitral valve were notably thickened and fibrotic.  There was restricted leaflet mobility.  There was severe thickening and foreshortening of much of the subvalvular apparatus, all pathognomonic for rheumatic heart disease.  The mitral annuloplasty ring is excised sharply.  The previous valve repair had been performed using core knot clips which were each carefully removed.  Once the ring had been completely excised the anterior leaflet of the mitral valve was excised with exception of a small portion of the free margin of the leaflet on either side of midline with a portion of the subvalvular apparatus.  These 2  paddles of the anterior leaflet tissue are then reimplanted to the mitral annulus laterally.  The posterior leaflet is divided in the midline and debulked.  Of note, the previous surgical procedure had utilized artificial Gore-Tex neo-cords for resuspension of the P2 segment of the posterior leaflet.  Small portions of the posterior leaflet are preserved with these cords intact, thereby preserving subvalvular apparatus to both the anterior and posterior leaflets.  The mitral annulus is sized to accept a 29 mm bileaflet mechanical prosthetic valve.  Mitral valve replacement is performed using interrupted 2-0 Ethibond horizontal mattress pledgeted sutures with pledgets in the supra annular position.  A Sorin Carbomedics Optiform bileaflet mechanical valve (size 29mm, catalog [id number], serial [id number]) was secured in place uneventfully. All sutures were secured using a Cor-knot device.  The atriotomy was closed using a 2-layer closure of running 3-0 Prolene suture after placing a sump drain across the mitral valve to serve as a left ventricular vent.   Tricuspid Valve Repair:  The inferior vena cava cannula was pulled down until the tip was just below the junction between the right atrium and the inferior vena cava. An oblique incision is made in the right atrium. Traction sutures are placed to facilitate exposure of the tricuspid valve. The tricuspid valve is inspected carefully. The tricuspid valve leaflets appear normal with normal mobility and no sign of any fibrosis or thickening.  Tricuspid ring annuloplasty is performed using interrupted 2-0 Ethibond horizontal mattress sutures placed circumferentially around the tricuspid annulus with exception of the area immediately below the triangle of Koch.   After placement of all of the annuloplasty sutures a single  dose of warm retrograde "reanimation dose" blood cardioplegia was given and the aortic cross clamp removed after a total cross clamp duration  of 105 minutes.  The tricuspid valve was sized to accept a 28 mm annuloplasty ring based upon the overall surface area of the combined anterior and posterior leaflets. An [facility] 3 annuloplasty ring (size 28mm, model #4900, serial [id number]) is implanted uneventfully. After completion of the annuloplasty the valve was tested with saline and appears to be competent. The right atriotomy incision is closed using a 2 layer closure of running 4-0 Prolene suture.     Procedure Completion:  Epicardial pacing wires are fixed to the right ventricular outflow tract and to the right atrial appendage. The patient is rewarmed to 37C temperature. The aortic and left ventricular vents are removed.  The patient is weaned and disconnected from cardiopulmonary bypass.  The patient's rhythm at separation from bypass was sinus.  The patient was weaned from cardiopulmonary bypass on milrinone 0.3 mcg/kg/min with low dose epinephrine and nitric oxide at 30 ppm. Total cardiopulmonary bypass time for the operation was 196 minutes.  Followup transesophageal echocardiogram performed after separation from bypass revealed a well-seated bileaflet mechanical valve in the mitral position that was functioning normally and without paravalvular leak.  Left ventricular function was unchanged from preoperatively.  There was a tricuspid annuloplasty ring in place with mild residual tricuspid regurgitation.  The aortic cannula was removed uneventfully. The femoral arterial and venous cannulas were removed and all Perclose sutures secured.  Manual pressure was maintained while Protamine was administered.  The mediastinum and pleural spaces were inspected for hemostasis and irrigated with saline solution. The mediastinum and both pleural spaces were drained using 4 chest tubes placed through separate stab incisions inferiorly.  The soft tissues anterior to the aorta were reapproximated loosely. The sternum is closed with double strength  sternal wire. The soft tissues anterior to the sternum were closed in multiple layers and the skin is closed with a running subcuticular skin closure.  The post-bypass portion of the operation was notable for stable rhythm and hemodynamics.  The patient received 2 units packed red blood cells during the procedure due to anemia which was present preoperatively and exacerbated by acute blood loss and hemodilution during cardiopulmonary bypass.   Disposition:  The patient tolerated the procedure well.  The patient was transported to the surgical intensive care unit in stable condition. There were no intraoperative complications. All sponge instrument and needle counts are verified correct at completion of the operation.    Salvatore Decentlarence H. Cornelius Moraswen MD 04/05/2019 3:21 PM

## 2019-04-05 NOTE — Anesthesia Procedure Notes (Signed)
Arterial Line Insertion Start/End7/23/2020 8:05 AM, 04/05/2019 8:15 AM Performed by: Johntae Broxterman T, Immunologist, CRNA  Patient location: OR. Preanesthetic checklist: patient identified, IV checked, risks and benefits discussed, surgical consent, monitors and equipment checked, pre-op evaluation and timeout performed Patient sedated Left, radial was placed Catheter size: 20 G Hand hygiene performed  and maximum sterile barriers used   Attempts: 1 Procedure performed without using ultrasound guided technique. Following insertion, dressing applied and Biopatch. Post procedure assessment: normal  Patient tolerated the procedure well with no immediate complications.

## 2019-04-05 NOTE — Progress Notes (Signed)
  Echocardiogram Echocardiogram Transesophageal has been performed.  Theresa Crawford 04/05/2019, 9:45 AM

## 2019-04-06 ENCOUNTER — Encounter (HOSPITAL_COMMUNITY): Payer: Self-pay | Admitting: Thoracic Surgery (Cardiothoracic Vascular Surgery)

## 2019-04-06 ENCOUNTER — Inpatient Hospital Stay (HOSPITAL_COMMUNITY): Payer: Self-pay

## 2019-04-06 DIAGNOSIS — Z954 Presence of other heart-valve replacement: Secondary | ICD-10-CM

## 2019-04-06 LAB — BASIC METABOLIC PANEL
Anion gap: 3 — ABNORMAL LOW (ref 5–15)
Anion gap: 7 (ref 5–15)
Anion gap: 8 (ref 5–15)
BUN: 12 mg/dL (ref 6–20)
BUN: 12 mg/dL (ref 6–20)
BUN: 13 mg/dL (ref 6–20)
CO2: 22 mmol/L (ref 22–32)
CO2: 20 mmol/L — ABNORMAL LOW (ref 22–32)
CO2: 20 mmol/L — ABNORMAL LOW (ref 22–32)
Calcium: 7.5 mg/dL — ABNORMAL LOW (ref 8.9–10.3)
Calcium: 7.7 mg/dL — ABNORMAL LOW (ref 8.9–10.3)
Calcium: 8.1 mg/dL — ABNORMAL LOW (ref 8.9–10.3)
Chloride: 114 mmol/L — ABNORMAL HIGH (ref 98–111)
Chloride: 112 mmol/L — ABNORMAL HIGH (ref 98–111)
Chloride: 111 mmol/L (ref 98–111)
Creatinine, Ser: 0.88 mg/dL (ref 0.44–1.00)
Creatinine, Ser: 0.95 mg/dL (ref 0.44–1.00)
Creatinine, Ser: 1.1 mg/dL — ABNORMAL HIGH (ref 0.44–1.00)
GFR calc Af Amer: >60 mL/min (ref >60)
GFR calc Af Amer: >60 mL/min (ref >60)
GFR calc Af Amer: >60 mL/min (ref >60)
GFR calc non Af Amer: >60 mL/min (ref >60)
GFR calc non Af Amer: >60 mL/min (ref >60)
GFR calc non Af Amer: >60 mL/min (ref >60)
Glucose, Bld: 127 mg/dL — ABNORMAL HIGH (ref 70–99)
Glucose, Bld: 120 mg/dL — ABNORMAL HIGH (ref 70–99)
Glucose, Bld: 174 mg/dL — ABNORMAL HIGH (ref 70–99)
Potassium: 4.6 mmol/L (ref 3.5–5.1)
Potassium: 4.6 mmol/L (ref 3.5–5.1)
Potassium: 4.5 mmol/L (ref 3.5–5.1)
Sodium: 139 mmol/L (ref 135–145)
Sodium: 139 mmol/L (ref 135–145)
Sodium: 139 mmol/L (ref 135–145)

## 2019-04-06 LAB — POCT I-STAT 7, (LYTES, BLD GAS, ICA,H+H)
Acid-base deficit: 5 mmol/L — ABNORMAL HIGH (ref 0.0–2.0)
Acid-base deficit: 5 mmol/L — ABNORMAL HIGH (ref 0.0–2.0)
Bicarbonate: 19.6 mmol/L — ABNORMAL LOW (ref 20.0–28.0)
Bicarbonate: 19.5 mmol/L — ABNORMAL LOW (ref 20.0–28.0)
Calcium, Ion: 1.13 mmol/L — ABNORMAL LOW (ref 1.15–1.40)
Calcium, Ion: 1.15 mmol/L (ref 1.15–1.40)
HCT: 30 % — ABNORMAL LOW (ref 36.0–46.0)
HCT: 30 % — ABNORMAL LOW (ref 36.0–46.0)
Hemoglobin: 10.2 g/dL — ABNORMAL LOW (ref 12.0–15.0)
Hemoglobin: 10.2 g/dL — ABNORMAL LOW (ref 12.0–15.0)
O2 Saturation: 96 %
O2 Saturation: 89 %
Patient temperature: 36.8
Patient temperature: 37.3
Potassium: 4.6 mmol/L (ref 3.5–5.1)
Potassium: 4.5 mmol/L (ref 3.5–5.1)
Sodium: 142 mmol/L (ref 135–145)
Sodium: 142 mmol/L (ref 135–145)
TCO2: 21 mmol/L — ABNORMAL LOW (ref 22–32)
TCO2: 21 mmol/L — ABNORMAL LOW (ref 22–32)
pCO2 arterial: 33.2 mmHg (ref 32.0–48.0)
pCO2 arterial: 34 mmHg (ref 32.0–48.0)
pH, Arterial: 7.378 (ref 7.350–7.450)
pH, Arterial: 7.369 (ref 7.350–7.450)
pO2, Arterial: 82 mmHg — ABNORMAL LOW (ref 83.0–108.0)
pO2, Arterial: 59 mmHg — ABNORMAL LOW (ref 83.0–108.0)

## 2019-04-06 LAB — BPAM RBC
Blood Product Expiration Date: 202008132359
Blood Product Expiration Date: 202008142359
Blood Product Expiration Date: 202008142359
Blood Product Expiration Date: 202008142359
ISSUE DATE / TIME: 202007230928
ISSUE DATE / TIME: 202007230928
ISSUE DATE / TIME: 202007232047
ISSUE DATE / TIME: 202007232349
Unit Type and Rh: 6200
Unit Type and Rh: 6200
Unit Type and Rh: 6200
Unit Type and Rh: 6200

## 2019-04-06 LAB — GLUCOSE, CAPILLARY
Glucose-Capillary: 107 mg/dL — ABNORMAL HIGH (ref 70–99)
Glucose-Capillary: 109 mg/dL — ABNORMAL HIGH (ref 70–99)
Glucose-Capillary: 118 mg/dL — ABNORMAL HIGH (ref 70–99)
Glucose-Capillary: 125 mg/dL — ABNORMAL HIGH (ref 70–99)
Glucose-Capillary: 100 mg/dL — ABNORMAL HIGH (ref 70–99)
Glucose-Capillary: 118 mg/dL — ABNORMAL HIGH (ref 70–99)
Glucose-Capillary: 124 mg/dL — ABNORMAL HIGH (ref 70–99)
Glucose-Capillary: 145 mg/dL — ABNORMAL HIGH (ref 70–99)
Glucose-Capillary: 128 mg/dL — ABNORMAL HIGH (ref 70–99)
Glucose-Capillary: 188 mg/dL — ABNORMAL HIGH (ref 70–99)
Glucose-Capillary: 136 mg/dL — ABNORMAL HIGH (ref 70–99)

## 2019-04-06 LAB — CBC
HCT: 30.1 % — ABNORMAL LOW (ref 36.0–46.0)
HCT: 30 % — ABNORMAL LOW (ref 36.0–46.0)
Hemoglobin: 9.8 g/dL — ABNORMAL LOW (ref 12.0–15.0)
Hemoglobin: 9.8 g/dL — ABNORMAL LOW (ref 12.0–15.0)
MCH: 27.2 pg (ref 26.0–34.0)
MCH: 27.8 pg (ref 26.0–34.0)
MCHC: 32.6 g/dL (ref 30.0–36.0)
MCHC: 32.7 g/dL (ref 30.0–36.0)
MCV: 83.6 fL (ref 80.0–100.0)
MCV: 85 fL (ref 80.0–100.0)
Platelets: 184 10*3/uL (ref 150–400)
Platelets: 225 10*3/uL (ref 150–400)
RBC: 3.6 MIL/uL — ABNORMAL LOW (ref 3.87–5.11)
RBC: 3.53 MIL/uL — ABNORMAL LOW (ref 3.87–5.11)
RDW: 18.3 % — ABNORMAL HIGH (ref 11.5–15.5)
RDW: 19 % — ABNORMAL HIGH (ref 11.5–15.5)
WBC: 15 10*3/uL — ABNORMAL HIGH (ref 4.0–10.5)
WBC: 22.3 10*3/uL — ABNORMAL HIGH (ref 4.0–10.5)
nRBC: 0 % (ref 0.0–0.2)
nRBC: 0 % (ref 0.0–0.2)

## 2019-04-06 LAB — TYPE AND SCREEN
ABO/RH(D): A POS
Antibody Screen: NEGATIVE
Unit division: 0
Unit division: 0
Unit division: 0
Unit division: 0

## 2019-04-06 LAB — MAGNESIUM
Magnesium: 3 mg/dL — ABNORMAL HIGH (ref 1.7–2.4)
Magnesium: 2.7 mg/dL — ABNORMAL HIGH (ref 1.7–2.4)

## 2019-04-06 MED ORDER — FUROSEMIDE 10 MG/ML IJ SOLN
4.0000 mg/h | INTRAVENOUS | Status: DC
  Administered 2019-04-06: 4 mg/h via INTRAVENOUS
  Filled 2019-04-06: qty 25

## 2019-04-06 MED ORDER — WARFARIN - PHYSICIAN DOSING INPATIENT
Freq: Every day | Status: DC
  Administered 2019-04-06: 19:00:00
  Administered 2019-04-08: 19:00:00 1

## 2019-04-06 MED ORDER — ENOXAPARIN SODIUM 30 MG/0.3ML ~~LOC~~ SOLN: 30.0000 mg | Freq: Every day | SUBCUTANEOUS | Status: DC

## 2019-04-06 MED ORDER — BUSPIRONE HCL 5 MG PO TABS
7.5000 mg | ORAL_TABLET | Freq: Three times a day (TID) | ORAL | Status: DC
  Administered 2019-04-06 – 2019-04-13 (×22): 7.5 mg via ORAL
  Filled 2019-04-06 (×23): qty 2

## 2019-04-06 MED ORDER — QUETIAPINE FUMARATE 25 MG PO TABS
50.0000 mg | ORAL_TABLET | Freq: Every day | ORAL | Status: DC
  Administered 2019-04-06 – 2019-04-12 (×7): 50 mg via ORAL
  Filled 2019-04-06: qty 1
  Filled 2019-04-06: qty 2
  Filled 2019-04-06: qty 1
  Filled 2019-04-06 (×3): qty 2
  Filled 2019-04-06: qty 1

## 2019-04-06 MED ORDER — TRAZODONE HCL 50 MG PO TABS
25.0000 mg | ORAL_TABLET | Freq: Every day | ORAL | Status: DC
  Administered 2019-04-06 – 2019-04-12 (×7): 25 mg via ORAL
  Filled 2019-04-06 (×7): qty 1

## 2019-04-06 MED ORDER — SERTRALINE HCL 100 MG PO TABS
100.0000 mg | ORAL_TABLET | Freq: Every day | ORAL | Status: DC
  Administered 2019-04-06 – 2019-04-13 (×8): 100 mg via ORAL
  Filled 2019-04-06 (×8): qty 1

## 2019-04-06 MED ORDER — INSULIN ASPART 100 UNIT/ML ~~LOC~~ SOLN
0.0000 [IU] | SUBCUTANEOUS | Status: DC
  Administered 2019-04-06 (×4): 2 [IU] via SUBCUTANEOUS
  Administered 2019-04-06: 4 [IU] via SUBCUTANEOUS
  Administered 2019-04-07 (×2): 2 [IU] via SUBCUTANEOUS
  Administered 2019-04-07 (×2): 4 [IU] via SUBCUTANEOUS
  Administered 2019-04-07 – 2019-04-09 (×5): 2 [IU] via SUBCUTANEOUS

## 2019-04-06 MED ORDER — INSULIN ASPART 100 UNIT/ML ~~LOC~~ SOLN: 0.0000 [IU] | SUBCUTANEOUS | Status: DC

## 2019-04-06 MED ORDER — CLONAZEPAM 0.5 MG PO TABS
0.5000 mg | ORAL_TABLET | Freq: Two times a day (BID) | ORAL | Status: DC
  Administered 2019-04-06 – 2019-04-13 (×15): 0.5 mg via ORAL
  Filled 2019-04-06 (×15): qty 1

## 2019-04-06 MED ORDER — ORAL CARE MOUTH RINSE
15.0000 mL | Freq: Two times a day (BID) | OROMUCOSAL | Status: DC
  Administered 2019-04-06 – 2019-04-10 (×8): 15 mL via OROMUCOSAL

## 2019-04-06 MED ORDER — WARFARIN SODIUM 5 MG PO TABS
5.0000 mg | ORAL_TABLET | Freq: Every day | ORAL | Status: DC
  Administered 2019-04-06 – 2019-04-12 (×7): 5 mg via ORAL
  Filled 2019-04-06 (×7): qty 1

## 2019-04-06 MED FILL — Dexmedetomidine HCl in NaCl 0.9% IV Soln 400 MCG/100ML: INTRAVENOUS | Qty: 100 | Status: AC

## 2019-04-06 NOTE — Progress Notes (Signed)
Pharmacy consult: review home sedative/neurologic medications   38 yo female s/p mech valve placement on several neurologic medications PTA (full list below) . Pharmacy was consulted to verify if patient was taking these at home. The following medications were noted PTA -Zoloft 100mg  po daily -Buspar 7.5mg  po tid -Seroquel 50mg  po daily -clonazepam 0.5mg  po bid -trazodone 25mg  po qhs  I verified these medications with the patient and her pharmacy [facility]). The only variance was that she is taking Seroquel 100mg  po daily at home.    Summary -She is currently taking zoloft, seroquel, klonopin, and trazodone at home -The current inpatient orders reflect her home regimen.  -Her home dose of seroquel was adjusted in her PTA medication list  Thank you for asking pharmacy to be involved in the care of this patient. Harland GermanAndrew Chalese Peach, PharmD Clinical Pharmacist **Pharmacist phone directory can now be found on [email or web address] ([email or web address]).  Listed under [facility].        Medications:  Medications Prior to Admission  Medication Sig Dispense Refill Last Dose  . albuterol (PROVENTIL HFA;VENTOLIN HFA) 108 (90 Base) MCG/ACT inhaler Inhale 2 puffs into the lungs every 6 (six) hours as needed for wheezing or shortness of breath.   Past Week at Unknown time  . budesonide (PULMICORT) 180 MCG/ACT inhaler Inhale 1 puff into the lungs 2 (two) times a day. 1 each 3 Past Week at Unknown time  . busPIRone (BUSPAR) 7.5 MG tablet Take 1 tablet (7.5 mg total) by mouth 3 (three) times daily. 90 tablet 1 04/04/2019 at Unknown time  . clonazePAM (KLONOPIN) 0.5 MG tablet Take 1 tablet (0.5 mg total) by mouth 2 (two) times daily. 60 tablet 0 04/04/2019 at Unknown time  . docusate sodium (COLACE) 100 MG capsule Take 100 mg by mouth daily as needed for moderate constipation.    Past Week at Unknown time  . ferrous sulfate 325 (65 FE) MG tablet Take 1 tablet (325 mg total) by mouth 2 (two) times daily with a meal.  (Patient taking differently: Take 325 mg by mouth daily with breakfast. )  3 04/04/2019 at Unknown time  . folic acid (FOLVITE) 800 MCG tablet Take 400 mcg by mouth daily.   Past Week at Unknown time  . hydrOXYzine (VISTARIL) 25 MG capsule Take 1 capsule (25 mg total) by mouth 3 (three) times daily as needed. 30 capsule 3 Past Week at Unknown time  . metoprolol succinate (TOPROL-XL) 25 MG 24 hr tablet Take 1 tablet (25 mg total) by mouth 2 (two) times a day. 60 tablet 3 04/05/2019 at 0643  . potassium chloride SA (K-DUR) 20 MEQ tablet Take 2 tablets (40 mEq total) by mouth daily. Take 2 tablets daily 60 tablet 3 04/04/2019 at Unknown time  . Probiotic Product (PROBIOTIC PO) Take 1 capsule by mouth daily.   04/04/2019 at Unknown time  . QUEtiapine (SEROQUEL) 50 MG tablet Take 1 tablet (50 mg total) by mouth at bedtime. 30 tablet 0 Past Week at Unknown time  . sertraline (ZOLOFT) 100 MG tablet Take 1 tablet (100 mg total) by mouth daily. 30 tablet 3 04/04/2019 at Unknown time  . torsemide (DEMADEX) 20 MG tablet Take 2 tablets (40 mg total) by mouth daily. 30 tablet 3 04/04/2019 at Unknown time  . traZODone (DESYREL) 50 MG tablet Take 0.5 tablets (25 mg total) by mouth at bedtime. 30 tablet 3 Past Week at Unknown time  . vitamin B-12 (CYANOCOBALAMIN) 1000 MCG tablet Take 1,000 mcg by mouth  daily.   Past Week at Unknown time  . EPINEPHrine (EPIPEN 2-PAK) 0.3 mg/0.3 mL IJ SOAJ injection Inject 0.3 Units into the muscle once.    Unknown at Unknown time

## 2019-04-06 NOTE — Progress Notes (Signed)
Social visit from pulmonary.  Glad to see her doing well.   [facility] 04/06/2019 6:44 PM

## 2019-04-06 NOTE — Progress Notes (Signed)
Anesthesiology Follow-up:  Patient awake on vent, moving all extremities, alert and responsive to commands. Hemodynamically stable on milrinone 0.3 mcg/kg/min, levophed 3 mcg/min phenylephrine 15 mcg/min  VS: t- 37.4 HR-72 (SR)  Bp-105/70 O2 sat- 99%  PA- 41/18 CVP-8 CO/CI- 5.0/2.4   Inhaled nitric oxide now weaned to 10 ppm   FiO2- 0.5 PRVC- 450 RR-20 PEEP-5  PH-7.378 PCO2- 33 PO2- 82 BE- (-5.0)  K-4.6 Na- 139 BUN/Cr- 12/0.95 glucose- 112   H/H- 9.8/30.1 Platelets- 184,000  CXR- diffuse bilateral airspace disease and bilateral pleural effusions.  38 year old female one day S/P MVR and tricuspid repair via redo sternotomy, patient has interstitial lung disease and chronic pulmonary hypertension. Stable hemodynamically on pressors, now weaning vent and nitric oxide.   Roberts Gaudy

## 2019-04-06 NOTE — Procedures (Signed)
Extubation Procedure Note  Patient Details:   Name: Theresa Crawford DOB: Oct 03, 1980 MRN: [id number]   Airway Documentation:    Vent end date: 04/06/19 Vent end time: 1114   Evaluation  O2 sats: stable throughout Complications: No apparent complications Patient did tolerate procedure well. Bilateral Breath Sounds: Clear   Yes   Pt weaned and extubated from vent and nitric oxide.  Pt placed on 6 l/m nasal cannula post-extubation.  Earney Navy 04/06/2019, 11:15 AM

## 2019-04-06 NOTE — Progress Notes (Addendum)
T[facility] DAILY ICU PROGRESS NOTE                   [address]            [place] [address]          [phone]   1 Day Post-Op Procedure(s) (LRB): TRANSESOPHAGEAL ECHOCARDIOGRAM (TEE) (N/A) Mitral Valve (Mv) Replacement using Carbomedics Optiform Valve size 50m (N/A) Tricuspid Valve Repair using Edwards MC3 Tricuspid ring size 288m(N/A)  Total Length of Stay:  LOS: 1 day   Subjective: Lightly sedated with Precedex and mechanically ventilated. She appears anxious when awakened but responds appropriately.  Nurse reports no major problems overnight. VS and hemodynamics stable on milrinone, levophed, neo-synephrine, and INO.   Objective: Vital signs in last 24 hours: Temp:  [97 F (36.1 C)-99.3 F (37.4 C)] 99.3 F (37.4 C) (07/24 0800) Pulse Rate:  [69-94] 72 (07/24 0806) Cardiac Rhythm: Normal sinus rhythm (07/24 0806) Resp:  [10-29] 25 (07/24 0806) BP: (100-126)/(73-74) 100/73 (07/24 0806) SpO2:  [95 %-100 %] 99 % (07/24 0806) Arterial Line BP: (79-118)/(42-68) 98/43 (07/24 0800) FiO2 (%):  [50 %-100 %] 50 % (07/24 0806) Weight:  [109.6 kg] 109.6 kg (07/24 0545)  Filed Weights   04/05/19 0547 04/06/19 0545  Weight: 102.5 kg 109.6 kg    Weight change: 7.087 kg   Hemodynamic parameters for last 24 hours: PAP: (34-50)/(15-28) 41/18 CVP:  [10 mmHg-17 mmHg] 14 mmHg CO:  [3.4 L/min-5 L/min] 5 L/min CI:  [1.7 L/min/m2-2.4 L/min/m2] 2.4 L/min/m2  Intake/Output from previous day: 07/23 0701 - 07/24 0700 In: 7248.7 [I.V.:5145; Blood:605; IV Piggyback:1498.8] Out: 313888Urine:1730; Blood:1100; Chest Tube:290]  Intake/Output this shift: Total I/O In: 80.3 [I.V.:80.3] Out: 80 [Urine:50; Chest Tube:30]  Current Meds: Scheduled Meds: . acetaminophen  1,000 mg Oral Q6H   Or  . acetaminophen (TYLENOL) oral liquid 160 mg/5 mL  1,000 mg Per Tube Q6H  . aspirin EC  325 mg Oral Daily   Or  . aspirin  324 mg Per Tube Daily  . bisacodyl  10 mg Oral Daily    Or  . bisacodyl  10 mg Rectal Daily  . budesonide  1 mg Nebulization BID  . chlorhexidine gluconate (MEDLINE KIT)  15 mL Mouth Rinse BID  . Chlorhexidine Gluconate Cloth  6 each Topical Daily  . Chlorhexidine Gluconate Cloth  6 each Topical Daily  . docusate sodium  200 mg Oral Daily  . insulin aspart  0-24 Units Subcutaneous Q4H  . mouth rinse  15 mL Mouth Rinse 10 times per day  . sodium chloride flush  10-40 mL Intracatheter Q12H  . sodium chloride flush  3 mL Intravenous Q12H   Continuous Infusions: . sodium chloride 10 mL/hr at 04/06/19 0800  . sodium chloride    . sodium chloride 10 mL/hr at 04/05/19 1600  . albumin human Stopped (04/06/19 0112)  . dexmedetomidine (PRECEDEX) IV infusion 0.3 mcg/kg/hr (04/06/19 0818)  . EPINEPHrine 4 mg in dextrose 5% 250 mL infusion (16 mcg/mL) Stopped (04/05/19 1745)  . lactated ringers    . lactated ringers 20 mL/hr at 04/06/19 0800  . lactated ringers    . milrinone 0.3 mcg/kg/min (04/06/19 0800)  . nitroGLYCERIN    . norepinephrine (LEVOPHED) Adult infusion 3 mcg/min (04/06/19 0800)  . phenylephrine (NEO-SYNEPHRINE) Adult infusion 30 mcg/min (04/06/19 0800)   PRN Meds:.sodium chloride, albumin human, lactated ringers, metoprolol tartrate, midazolam, morphine injection, oxyCODONE, sodium chloride flush, sodium chloride flush  General appearance:  alert and moderate distress Neurologic: responds appropriately, MAE.  Heart: Paced DDD overnight, intrinsic rhythm is NSR at 70/min/ Lungs: Breath sounds clear anterior. CXR shows tubes and lines appropriately positioned, lung fields not as 'wet" as initial post-op CXR.  Abdomen: Soft, non-tender. Absent BS.  Extremities: All are well perfused. Trace peripheral edema.  Wound: the sternotomy incision is covered with a clean Aquacel dressing.   Lab Results: CBC: Recent Labs    04/05/19 2222 04/06/19 0434 04/06/19 0447  WBC 17.0*  --  15.0*  HGB 10.6* 10.2* 9.8*  HCT 32.9* 30.0* 30.1*   PLT 168  --  184   BMET:  Recent Labs    04/05/19 2222 04/06/19 0434 04/06/19 0447  NA 139 142 139  K 4.6 4.6 4.6  CL 114*  --  112*  CO2 22  --  20*  GLUCOSE 127*  --  120*  BUN 12  --  12  CREATININE 0.88  0.92  --  0.95  CALCIUM 7.5*  --  7.7*    CMET: Lab Results  Component Value Date   WBC 15.0 (H) 04/06/2019   HGB 9.8 (L) 04/06/2019   HCT 30.1 (L) 04/06/2019   PLT 184 04/06/2019   GLUCOSE 120 (H) 04/06/2019   ALT 16 04/03/2019   AST 17 04/03/2019   NA 139 04/06/2019   K 4.6 04/06/2019   CL 112 (H) 04/06/2019   CREATININE 0.95 04/06/2019   BUN 12 04/06/2019   CO2 20 (L) 04/06/2019   TSH 3.797 03/04/2019   INR 1.7 (H) 04/05/2019   HGBA1C 5.9 (H) 04/03/2019      PT/INR:  Recent Labs    04/05/19 1550  LABPROT 19.6*  INR 1.7*   Radiology: Dg Chest Port 1 View  Result Date: 04/05/2019 CLINICAL DATA:  Status post mitral valve replacement EXAM: PORTABLE CHEST 1 VIEW COMPARISON:  04/05/2019 FINDINGS: Endotracheal tube, Swan-Ganz catheter, mediastinal drain, pericardial drain and bilateral thoracostomy catheters are now seen in satisfactory position. No pneumothorax is noted. Cardiac shadow is accentuated. Changes of recent mitral valve replacement are seen. Diffuse increased vascular congestion is noted with some focal consolidation is seen in the left upper lobe. This may be related to underlying edema given generalized edematous pattern. IMPRESSION: Tubes and lines as described above. Changes consistent with pulmonary edema most marked in the left upper lobe. No pneumothorax is seen. Electronically Signed   By: Inez Catalina M.D.   On: 04/05/2019 16:40     Assessment/Plan: S/P Procedure(s) (LRB): TRANSESOPHAGEAL ECHOCARDIOGRAM (TEE) (N/A) Mitral Valve (Mv) Replacement using Carbomedics Optiform Valve size 61m (N/A) Tricuspid Valve Repair using Edwards MC3 Tricuspid ring size 296m(N/A)  -POD1 redo median sternotomy and mitral valve replacement with a 29  Carbomedics mechanical valve and Tricuspid valve ring annuloplasty for mitral stenosis and recurrent mitral insufficiency and TR.  Hemodynamics stable and PAP's significantly improved since surgery. Plan to wean vent to extubate this am and wean INO off today.   -Sarcoidosis- Dx suspected based on physical findings but not yet proven by tissue histology. Mediastinal lymph node bx obtained at surgery, path pending.    -Expected acute blood loss anemia. Mild and ahct stable. Monitor.  -Volume excess-plan to initiate diuresis after inotropic and vasopressor support weaned.  -History of major depression-resume SSRI and anxiolytics when tolerating PO's.  -DVT PPX- Continue SCD's for now.    MyAntony OdeaPA-C [id number]/24/2020 8:46 AM   I have seen and examined the patient and agree with  the assessment and plan as outlined.  Looks good.  Maintaining NSR w/ stable hemodynamics, milrinone 0.3 and low dose levophed, nitric now weaned down to 5 ppm.  Proceed with acute vent wean and possible extubation.  Start lasix drip at low dose and increase later as needed.  D/C Swan-Ganz and mobilize once extubated.  Start Coumadin once tolerating oral medications.    Rexene Alberts, MD 04/06/2019 9:26 AM

## 2019-04-06 NOTE — Progress Notes (Signed)
Spoke with pt mother and updated family on pt progress.

## 2019-04-07 ENCOUNTER — Inpatient Hospital Stay (HOSPITAL_COMMUNITY): Payer: Self-pay

## 2019-04-07 LAB — POCT I-STAT 7, (LYTES, BLD GAS, ICA,H+H)
Acid-base deficit: 3 mmol/L — ABNORMAL HIGH (ref 0.0–2.0)
Bicarbonate: 22.4 mmol/L (ref 20.0–28.0)
Calcium, Ion: 1.19 mmol/L (ref 1.15–1.40)
HCT: 27 % — ABNORMAL LOW (ref 36.0–46.0)
Hemoglobin: 9.2 g/dL — ABNORMAL LOW (ref 12.0–15.0)
O2 Saturation: 94 %
Patient temperature: 98.9
Potassium: 4 mmol/L (ref 3.5–5.1)
Sodium: 137 mmol/L (ref 135–145)
TCO2: 24 mmol/L (ref 22–32)
pCO2 arterial: 39.4 mmHg (ref 32.0–48.0)
pH, Arterial: 7.363 (ref 7.350–7.450)
pO2, Arterial: 73 mmHg — ABNORMAL LOW (ref 83.0–108.0)

## 2019-04-07 LAB — BASIC METABOLIC PANEL
Anion gap: 8 (ref 5–15)
BUN: 13 mg/dL (ref 6–20)
CO2: 21 mmol/L — ABNORMAL LOW (ref 22–32)
Calcium: 8 mg/dL — ABNORMAL LOW (ref 8.9–10.3)
Chloride: 105 mmol/L (ref 98–111)
Creatinine, Ser: 0.99 mg/dL (ref 0.44–1.00)
GFR calc Af Amer: >60 mL/min (ref >60)
GFR calc non Af Amer: >60 mL/min (ref >60)
Glucose, Bld: 141 mg/dL — ABNORMAL HIGH (ref 70–99)
Potassium: 3.9 mmol/L (ref 3.5–5.1)
Sodium: 134 mmol/L — ABNORMAL LOW (ref 135–145)

## 2019-04-07 LAB — CBC
HCT: 26.6 % — ABNORMAL LOW (ref 36.0–46.0)
Hemoglobin: 8.5 g/dL — ABNORMAL LOW (ref 12.0–15.0)
MCH: 27.2 pg (ref 26.0–34.0)
MCHC: 32 g/dL (ref 30.0–36.0)
MCV: 85 fL (ref 80.0–100.0)
Platelets: 189 10*3/uL (ref 150–400)
RBC: 3.13 MIL/uL — ABNORMAL LOW (ref 3.87–5.11)
RDW: 19.2 % — ABNORMAL HIGH (ref 11.5–15.5)
WBC: 19.1 10*3/uL — ABNORMAL HIGH (ref 4.0–10.5)
nRBC: 0 % (ref 0.0–0.2)

## 2019-04-07 LAB — GLUCOSE, CAPILLARY
Glucose-Capillary: 117 mg/dL — ABNORMAL HIGH (ref 70–99)
Glucose-Capillary: 127 mg/dL — ABNORMAL HIGH (ref 70–99)
Glucose-Capillary: 135 mg/dL — ABNORMAL HIGH (ref 70–99)
Glucose-Capillary: 152 mg/dL — ABNORMAL HIGH (ref 70–99)
Glucose-Capillary: 166 mg/dL — ABNORMAL HIGH (ref 70–99)
Glucose-Capillary: 175 mg/dL — ABNORMAL HIGH (ref 70–99)

## 2019-04-07 LAB — PROTIME-INR
INR: 1.6 — ABNORMAL HIGH (ref 0.8–1.2)
Prothrombin Time: 18.5 seconds — ABNORMAL HIGH (ref 11.4–15.2)

## 2019-04-07 MED ORDER — ASPIRIN EC 81 MG PO TBEC
81.0000 mg | DELAYED_RELEASE_TABLET | Freq: Every day | ORAL | Status: DC
  Administered 2019-04-07 – 2019-04-13 (×7): 81 mg via ORAL
  Filled 2019-04-07 (×7): qty 1

## 2019-04-07 MED ORDER — CHLORHEXIDINE GLUCONATE 0.12 % MT SOLN
OROMUCOSAL | Status: AC
  Filled 2019-04-07: qty 15

## 2019-04-07 MED ORDER — FUROSEMIDE 10 MG/ML IJ SOLN
40.0000 mg | Freq: Two times a day (BID) | INTRAMUSCULAR | Status: DC
  Administered 2019-04-07 – 2019-04-09 (×4): 40 mg via INTRAVENOUS
  Filled 2019-04-07 (×4): qty 4

## 2019-04-07 NOTE — Progress Notes (Signed)
T[facility] Evening rounds  Stable hemodynamically Ambulated several times today Left IJ cordis leaking--will switch to intermittent lasix dosing Continue to monitor

## 2019-04-07 NOTE — Anesthesia Preprocedure Evaluation (Addendum)
Anesthesia Evaluation  Patient identified by MRN, date of birth, ID band Patient awake    Reviewed: Allergy & Precautions, NPO status , Patient's Chart, lab work & pertinent test results  Airway Mallampati: II  TM Distance: >3 FB Neck ROM: Full    Dental  (+) Teeth Intact   Pulmonary Current Smoker,     + decreased breath sounds      Cardiovascular hypertension,  Rhythm:Regular Rate:Normal + Systolic murmurs    Neuro/Psych    GI/Hepatic   Endo/Other    Renal/GU      Musculoskeletal   Abdominal   Peds  Hematology   Anesthesia Other Findings   Reproductive/Obstetrics                            Anesthesia Physical Anesthesia Plan  ASA: III  Anesthesia Plan: General   Post-op Pain Management:    Induction: Intravenous  PONV Risk Score and Plan: Ondansetron and Dexamethasone  Airway Management Planned: Oral ETT  Additional Equipment: Arterial line, CVP, PA Cath, 3D TEE and Ultrasound Guidance Line Placement  Intra-op Plan:   Post-operative Plan: Post-operative intubation/ventilation  Informed Consent: I have reviewed the patients History and Physical, chart, labs and discussed the procedure including the risks, benefits and alternatives for the proposed anesthesia with the patient or authorized representative who has indicated his/her understanding and acceptance.       Plan Discussed with: Anesthesiologist and CRNA  Anesthesia Plan Comments:        Anesthesia Quick Evaluation

## 2019-04-07 NOTE — Anesthesia Postprocedure Evaluation (Signed)
Anesthesia Post Note  Patient: Theresa Crawford  Procedure(s) Performed: TRANSESOPHAGEAL ECHOCARDIOGRAM (TEE) (N/A ) Mitral Valve (Mv) Replacement using Carbomedics Optiform Valve size 75mm (N/A Chest) Tricuspid Valve Repair using Edwards MC3 Tricuspid ring size 70mm (N/A Chest)     Patient location during evaluation: SICU Anesthesia Type: General Level of consciousness: sedated and awake Pain management: pain level controlled Vital Signs Assessment: post-procedure vital signs reviewed and stable Respiratory status: patient remains intubated per anesthesia plan and patient on ventilator - see flowsheet for VS Cardiovascular status: stable Postop Assessment: no apparent nausea or vomiting Anesthetic complications: no    Last Vitals:  Vitals:   04/07/19 0849 04/07/19 0900  BP:  108/66  Pulse:  (!) 110  Resp:  (!) 21  Temp:    SpO2: 96% 95%    Last Pain:  Vitals:   04/07/19 0746  TempSrc: Oral  PainSc:                  Ty Buntrock COKER

## 2019-04-07 NOTE — Progress Notes (Signed)
Mother called and updated on pt's progress.

## 2019-04-07 NOTE — Progress Notes (Signed)
2 Days Post-Op Procedure(s) (LRB): TRANSESOPHAGEAL ECHOCARDIOGRAM (TEE) (N/A) Mitral Valve (Mv) Replacement using Carbomedics Optiform Valve size 73mm (N/A) Tricuspid Valve Repair using Edwards MC3 Tricuspid ring size 50mm (N/A) Subjective: No complaints, "sleepy"  Objective: Vital signs in last 24 hours: Temp:  [96.1 F (35.6 C)-99.1 F (37.3 C)] 98.6 F (37 C) (07/25 0746) Pulse Rate:  [63-109] 109 (07/25 0730) Cardiac Rhythm: Normal sinus rhythm (07/25 0730) Resp:  [17-35] 20 (07/25 0730) BP: (84-132)/(51-82) 91/53 (07/25 0730) SpO2:  [94 %-100 %] 99 % (07/25 0730) Arterial Line BP: (99-131)/(44-59) 118/50 (07/25 0730) FiO2 (%):  [40 %] 40 % (07/24 1040) Weight:  [109.2 kg] 109.2 kg (07/25 0500)  Hemodynamic parameters for last 24 hours: PAP: (43-58)/(18-26) 49/21 CVP:  [12 mmHg-21 mmHg] 16 mmHg  Intake/Output from previous day: 07/24 0701 - 07/25 0700 In: 1663.4 [P.O.:600; I.V.:1063.4] Out: 1390 [Urine:990; Chest Tube:400] Intake/Output this shift: No intake/output data recorded.  General appearance: alert, cooperative and no distress Neurologic: intact Heart: regular rate and rhythm, S1, S2 normal, no murmur, click, rub or gallop Lungs: clear to auscultation bilaterally Wound: dressing dry  Lab Results: Recent Labs    04/06/19 1700 04/07/19 0336 04/07/19 0348  WBC 22.3* 19.1*  --   HGB 9.8* 8.5* 9.2*  HCT 30.0* 26.6* 27.0*  PLT 225 189  --    BMET:  Recent Labs    04/06/19 1700 04/07/19 0336 04/07/19 0348  NA 139 134* 137  K 4.5 3.9 4.0  CL 111 105  --   CO2 20* 21*  --   GLUCOSE 174* 141*  --   BUN 13 13  --   CREATININE 1.10* 0.99  --   CALCIUM 8.1* 8.0*  --     PT/INR:  Recent Labs    04/07/19 0336  LABPROT 18.5*  INR 1.6*   ABG    Component Value Date/Time   PHART 7.363 04/07/2019 0348   HCO3 22.4 04/07/2019 0348   TCO2 24 04/07/2019 0348   ACIDBASEDEF 3.0 (H) 04/07/2019 0348   O2SAT 94.0 04/07/2019 0348   CBG (last 3)  Recent  Labs    04/06/19 2008 04/06/19 2321 04/07/19 0343  GLUCAP 188* 136* 135*    Assessment/Plan: S/P Procedure(s) (LRB): TRANSESOPHAGEAL ECHOCARDIOGRAM (TEE) (N/A) Mitral Valve (Mv) Replacement using Carbomedics Optiform Valve size 76mm (N/A) Tricuspid Valve Repair using Edwards MC3 Tricuspid ring size 82mm (N/A) Mobilize Diuresis continue chest tubes another day; continue lasix gtt; wean milirinone to off   LOS: 2 days    Theresa Crawford 04/07/2019

## 2019-04-08 ENCOUNTER — Inpatient Hospital Stay (HOSPITAL_COMMUNITY): Payer: Self-pay

## 2019-04-08 LAB — PROTIME-INR
INR: 1.5 — ABNORMAL HIGH (ref 0.8–1.2)
Prothrombin Time: 17.9 seconds — ABNORMAL HIGH (ref 11.4–15.2)

## 2019-04-08 LAB — BASIC METABOLIC PANEL
Anion gap: 7 (ref 5–15)
BUN: 14 mg/dL (ref 6–20)
CO2: 24 mmol/L (ref 22–32)
Calcium: 8.2 mg/dL — ABNORMAL LOW (ref 8.9–10.3)
Chloride: 104 mmol/L (ref 98–111)
Creatinine, Ser: 0.89 mg/dL (ref 0.44–1.00)
GFR calc Af Amer: >60 mL/min (ref >60)
GFR calc non Af Amer: >60 mL/min (ref >60)
Glucose, Bld: 96 mg/dL (ref 70–99)
Potassium: 3.9 mmol/L (ref 3.5–5.1)
Sodium: 135 mmol/L (ref 135–145)

## 2019-04-08 LAB — CBC
HCT: 26.1 % — ABNORMAL LOW (ref 36.0–46.0)
Hemoglobin: 8.3 g/dL — ABNORMAL LOW (ref 12.0–15.0)
MCH: 26.8 pg (ref 26.0–34.0)
MCHC: 31.8 g/dL (ref 30.0–36.0)
MCV: 84.2 fL (ref 80.0–100.0)
Platelets: 197 10*3/uL (ref 150–400)
RBC: 3.1 MIL/uL — ABNORMAL LOW (ref 3.87–5.11)
RDW: 19 % — ABNORMAL HIGH (ref 11.5–15.5)
WBC: 16.6 10*3/uL — ABNORMAL HIGH (ref 4.0–10.5)
nRBC: 0 % (ref 0.0–0.2)

## 2019-04-08 LAB — GLUCOSE, CAPILLARY
Glucose-Capillary: 95 mg/dL (ref 70–99)
Glucose-Capillary: 140 mg/dL — ABNORMAL HIGH (ref 70–99)
Glucose-Capillary: 87 mg/dL (ref 70–99)
Glucose-Capillary: 105 mg/dL — ABNORMAL HIGH (ref 70–99)
Glucose-Capillary: 146 mg/dL — ABNORMAL HIGH (ref 70–99)

## 2019-04-08 MED ORDER — CHLORHEXIDINE GLUCONATE 0.12 % MT SOLN
OROMUCOSAL | Status: AC
  Filled 2019-04-08: qty 15

## 2019-04-08 NOTE — Progress Notes (Signed)
Attempted return call to mother. LVM.

## 2019-04-08 NOTE — Progress Notes (Signed)
3 Days Post-Op Procedure(s) (LRB): TRANSESOPHAGEAL ECHOCARDIOGRAM (TEE) (N/A) Mitral Valve (Mv) Replacement using Carbomedics Optiform Valve size 21mm (N/A) Tricuspid Valve Repair using Edwards MC3 Tricuspid ring size 27mm (N/A) Subjective: No complaints  Objective: Vital signs in last 24 hours: Temp:  [97.8 F (36.6 C)-98.3 F (36.8 C)] 97.8 F (36.6 C) (07/26 0300) Pulse Rate:  [57-119] 97 (07/26 0730) Cardiac Rhythm: Normal sinus rhythm (07/26 0746) Resp:  [9-38] 19 (07/26 0730) BP: (101-152)/(52-84) 115/82 (07/26 0730) SpO2:  [91 %-100 %] 100 % (07/26 0812) Arterial Line BP: (107-150)/(47-72) 114/47 (07/26 0600) Weight:  [106.9 kg] 106.9 kg (07/26 0500)  Hemodynamic parameters for last 24 hours:    Intake/Output from previous day: 07/25 0701 - 07/26 0700 In: 754.8 [P.O.:400; I.V.:354.8] Out: 1610 [Urine:1140; Chest Tube:430] Intake/Output this shift: No intake/output data recorded.  1800 UOP weight decreased 2.3 kg  General appearance: alert, cooperative, appears stated age and no distress Neurologic: intact Heart: regular rate and rhythm, S1, S2 normal, no murmur, click, rub or gallop Lungs: clear to auscultation bilaterally Wound: c/d/i  Lab Results: Recent Labs    04/07/19 0336 04/07/19 0348 04/08/19 0317  WBC 19.1*  --  16.6*  HGB 8.5* 9.2* 8.3*  HCT 26.6* 27.0* 26.1*  PLT 189  --  197   BMET:  Recent Labs    04/07/19 0336 04/07/19 0348 04/08/19 0317  NA 134* 137 135  K 3.9 4.0 3.9  CL 105  --  104  CO2 21*  --  24  GLUCOSE 141*  --  96  BUN 13  --  14  CREATININE 0.99  --  0.89  CALCIUM 8.0*  --  8.2*    PT/INR:  Recent Labs    04/08/19 0317  LABPROT 17.9*  INR 1.5*   ABG    Component Value Date/Time   PHART 7.363 04/07/2019 0348   HCO3 22.4 04/07/2019 0348   TCO2 24 04/07/2019 0348   ACIDBASEDEF 3.0 (H) 04/07/2019 0348   O2SAT 94.0 04/07/2019 0348   CBG (last 3)  Recent Labs    04/07/19 1956 04/07/19 2340 04/08/19 0319   GLUCAP 166* 127* 95    Assessment/Plan: S/P Procedure(s) (LRB): TRANSESOPHAGEAL ECHOCARDIOGRAM (TEE) (N/A) Mitral Valve (Mv) Replacement using Carbomedics Optiform Valve size 71mm (N/A) Tricuspid Valve Repair using Edwards MC3 Tricuspid ring size 37mm (N/A) Mobilize Diuresis remove IJ cordis; d/c milrinone; continue lasix and chest tubes   LOS: 3 days    Wonda Olds 04/08/2019

## 2019-04-08 NOTE — Plan of Care (Signed)
°  Problem: Education: °Goal: Will demonstrate proper wound care and an understanding of methods to prevent future damage °Outcome: Progressing °Goal: Knowledge of disease or condition will improve °Outcome: Progressing °Goal: Knowledge of the prescribed therapeutic regimen will improve °Outcome: Progressing °Goal: Individualized Educational Video(s) °Outcome: Progressing °  °Problem: Activity: °Goal: Risk for activity intolerance will decrease °Outcome: Progressing °  °Problem: Cardiac: °Goal: Will achieve and/or maintain hemodynamic stability °Outcome: Progressing °  °Problem: Clinical Measurements: °Goal: Postoperative complications will be avoided or minimized °Outcome: Progressing °  °

## 2019-04-09 LAB — CBC
HCT: 26 % — ABNORMAL LOW (ref 36.0–46.0)
Hemoglobin: 8.3 g/dL — ABNORMAL LOW (ref 12.0–15.0)
MCH: 26.9 pg (ref 26.0–34.0)
MCHC: 31.9 g/dL (ref 30.0–36.0)
MCV: 84.4 fL (ref 80.0–100.0)
Platelets: 232 10*3/uL (ref 150–400)
RBC: 3.08 MIL/uL — ABNORMAL LOW (ref 3.87–5.11)
RDW: 18.8 % — ABNORMAL HIGH (ref 11.5–15.5)
WBC: 10.7 10*3/uL — ABNORMAL HIGH (ref 4.0–10.5)
nRBC: 0.4 % — ABNORMAL HIGH (ref 0.0–0.2)

## 2019-04-09 LAB — BASIC METABOLIC PANEL
Anion gap: 8 (ref 5–15)
BUN: 10 mg/dL (ref 6–20)
CO2: 26 mmol/L (ref 22–32)
Calcium: 8.3 mg/dL — ABNORMAL LOW (ref 8.9–10.3)
Chloride: 102 mmol/L (ref 98–111)
Creatinine, Ser: 0.75 mg/dL (ref 0.44–1.00)
GFR calc Af Amer: >60 mL/min (ref >60)
GFR calc non Af Amer: >60 mL/min (ref >60)
Glucose, Bld: 73 mg/dL (ref 70–99)
Potassium: 3.3 mmol/L — ABNORMAL LOW (ref 3.5–5.1)
Sodium: 136 mmol/L (ref 135–145)

## 2019-04-09 LAB — GLUCOSE, CAPILLARY
Glucose-Capillary: 101 mg/dL — ABNORMAL HIGH (ref 70–99)
Glucose-Capillary: 109 mg/dL — ABNORMAL HIGH (ref 70–99)
Glucose-Capillary: 118 mg/dL — ABNORMAL HIGH (ref 70–99)
Glucose-Capillary: 138 mg/dL — ABNORMAL HIGH (ref 70–99)
Glucose-Capillary: 146 mg/dL — ABNORMAL HIGH (ref 70–99)

## 2019-04-09 LAB — PROTIME-INR
INR: 1.7 — ABNORMAL HIGH (ref 0.8–1.2)
Prothrombin Time: 19.8 seconds — ABNORMAL HIGH (ref 11.4–15.2)

## 2019-04-09 MED ORDER — DOCUSATE SODIUM 100 MG PO CAPS
200.0000 mg | ORAL_CAPSULE | Freq: Every day | ORAL | Status: DC
  Administered 2019-04-10 – 2019-04-13 (×4): 200 mg via ORAL
  Filled 2019-04-09 (×4): qty 2

## 2019-04-09 MED ORDER — FERROUS SULFATE 325 (65 FE) MG PO TABS
325.0000 mg | ORAL_TABLET | Freq: Every day | ORAL | Status: DC
  Administered 2019-04-10 – 2019-04-13 (×4): 325 mg via ORAL
  Filled 2019-04-09 (×5): qty 1

## 2019-04-09 MED ORDER — INSULIN ASPART 100 UNIT/ML ~~LOC~~ SOLN
0.0000 [IU] | Freq: Three times a day (TID) | SUBCUTANEOUS | Status: DC
  Administered 2019-04-10: 2 [IU] via SUBCUTANEOUS

## 2019-04-09 MED ORDER — BISACODYL 10 MG RE SUPP: 10.0000 mg | Freq: Every day | RECTAL | Status: DC | PRN

## 2019-04-09 MED ORDER — SODIUM CHLORIDE 0.9% FLUSH: 3.0000 mL | INTRAVENOUS | Status: DC | PRN

## 2019-04-09 MED ORDER — FOLIC ACID 1 MG PO TABS
500.0000 ug | ORAL_TABLET | Freq: Every day | ORAL | Status: DC
  Administered 2019-04-09 – 2019-04-12 (×4): 0.5 mg via ORAL
  Filled 2019-04-09 (×6): qty 1

## 2019-04-09 MED ORDER — ACETAMINOPHEN 325 MG PO TABS: 650.0000 mg | ORAL_TABLET | Freq: Four times a day (QID) | ORAL | Status: DC | PRN

## 2019-04-09 MED ORDER — TRAMADOL HCL 50 MG PO TABS
50.0000 mg | ORAL_TABLET | Freq: Four times a day (QID) | ORAL | Status: DC | PRN
  Administered 2019-04-09 – 2019-04-13 (×9): 50 mg via ORAL
  Filled 2019-04-09 (×9): qty 1

## 2019-04-09 MED ORDER — OXYCODONE HCL 5 MG PO TABS
5.0000 mg | ORAL_TABLET | Freq: Four times a day (QID) | ORAL | Status: DC | PRN
  Administered 2019-04-09 – 2019-04-13 (×11): 10 mg via ORAL
  Filled 2019-04-09 (×11): qty 2

## 2019-04-09 MED ORDER — PANTOPRAZOLE SODIUM 40 MG PO TBEC
40.0000 mg | DELAYED_RELEASE_TABLET | Freq: Every day | ORAL | Status: DC
  Administered 2019-04-10 – 2019-04-13 (×4): 40 mg via ORAL
  Filled 2019-04-09 (×4): qty 1

## 2019-04-09 MED ORDER — BISACODYL 5 MG PO TBEC: 10.0000 mg | DELAYED_RELEASE_TABLET | Freq: Every day | ORAL | Status: DC | PRN

## 2019-04-09 MED ORDER — MAGNESIUM HYDROXIDE 400 MG/5ML PO SUSP: 30.0000 mL | Freq: Every day | ORAL | Status: DC | PRN

## 2019-04-09 MED ORDER — MOVING RIGHT ALONG BOOK
Freq: Once | Status: AC
  Administered 2019-04-09: 16:00:00 1
  Filled 2019-04-09: qty 1

## 2019-04-09 MED ORDER — FUROSEMIDE 10 MG/ML IJ SOLN
40.0000 mg | Freq: Two times a day (BID) | INTRAMUSCULAR | Status: DC
  Administered 2019-04-09: 18:00:00 40 mg via INTRAVENOUS
  Filled 2019-04-09: qty 4

## 2019-04-09 MED ORDER — METOPROLOL TARTRATE 12.5 MG HALF TABLET
12.5000 mg | ORAL_TABLET | Freq: Two times a day (BID) | ORAL | Status: DC
  Administered 2019-04-09 (×2): 12.5 mg via ORAL
  Filled 2019-04-09 (×2): qty 1

## 2019-04-09 MED ORDER — POTASSIUM CHLORIDE CRYS ER 20 MEQ PO TBCR
40.0000 meq | EXTENDED_RELEASE_TABLET | Freq: Three times a day (TID) | ORAL | Status: AC
  Administered 2019-04-09 (×3): 40 meq via ORAL
  Filled 2019-04-09 (×3): qty 2

## 2019-04-09 MED ORDER — POTASSIUM CHLORIDE CRYS ER 20 MEQ PO TBCR
20.0000 meq | EXTENDED_RELEASE_TABLET | ORAL | Status: DC
  Administered 2019-04-09: 20 meq via ORAL
  Filled 2019-04-09: qty 1

## 2019-04-09 MED ORDER — SODIUM CHLORIDE 0.9% FLUSH
3.0000 mL | Freq: Two times a day (BID) | INTRAVENOUS | Status: DC
  Administered 2019-04-09 – 2019-04-10 (×3): 3 mL via INTRAVENOUS
  Administered 2019-04-11: 10 mL via INTRAVENOUS
  Administered 2019-04-11 – 2019-04-12 (×3): 3 mL via INTRAVENOUS

## 2019-04-09 MED ORDER — SODIUM CHLORIDE 0.9 % IV SOLN: 250.0000 mL | INTRAVENOUS | Status: DC | PRN

## 2019-04-09 NOTE — Discharge Instructions (Addendum)
Discharge Instructions:  1. You may shower, please wash incisions daily with soap and water and keep dry.  If you wish to cover wounds with dressing you may do so but please keep clean and change daily.  No tub baths or swimming until incisions have completely healed.  If your incisions become red or develop any drainage please call our office at [phone]  2. No Driving until cleared by Dr. Owen's office and you are no longer using narcotic pain medications  3. Monitor your weight daily.. Please use the same scale and weigh at same time... If you gain 5-10 lbs in 48 hours with associated lower extremity swelling, please contact our office at [phone]  4. Fever of 101.5 for at least 24 hours with no source, please contact our office at [phone]  5. Activity- up as tolerated, please walk at least 3 times per day.  Avoid strenuous activity, no lifting, pushing, or pulling with your arms over 8-10 lbs for a minimum of 6 weeks  6. If any questions or concerns arise, please do not hesitate to contact our office at [phone] Information on my medicine - Coumadin   (Warfarin)   Why was Coumadin prescribed for you? Coumadin was prescribed for you because you have a blood clot or a medical condition that can cause an increased risk of forming blood clots. Blood clots can cause serious health problems by blocking the flow of blood to the heart, lung, or brain. Coumadin can prevent harmful blood clots from forming. As a reminder your indication for Coumadin is:   Blood Clot Prevention After Heart Valve Surgery  What test will check on my response to Coumadin? While on Coumadin (warfarin) you will need to have an INR test regularly to ensure that your dose is keeping you in the desired range. The INR (international normalized ratio) number is calculated from the result of the laboratory test called prothrombin time (PT).  If an INR APPOINTMENT HAS NOT ALREADY BEEN MADE FOR YOU please  schedule an appointment to have this lab work done by your health care provider within 7 days. Your INR goal is usually a number between:  2 to 3 or your provider may give you a more narrow range like 2-2.5.  Ask your health care provider during an office visit what your goal INR is.  What  do you need to  know  About  COUMADIN? Take Coumadin (warfarin) exactly as prescribed by your healthcare provider about the same time each day.  DO NOT stop taking without talking to the doctor who prescribed the medication.  Stopping without other blood clot prevention medication to take the place of Coumadin may increase your risk of developing a new clot or stroke.  Get refills before you run out.  What do you do if you miss a dose? If you miss a dose, take it as soon as you remember on the same day then continue your regularly scheduled regimen the next day.  Do not take two doses of Coumadin at the same time.  Important Safety Information A possible side effect of Coumadin (Warfarin) is an increased risk of bleeding. You should call your healthcare provider right away if you experience any of the following: ? Bleeding from an injury or your nose that does not stop. ? Unusual colored urine (red or dark brown) or unusual colored stools (red or black). ? Unusual bruising for unknown reasons. ? A serious fall or if you hit your head (even if   there is no bleeding).  Some foods or medicines interact with Coumadin (warfarin) and might alter your response to warfarin. To help avoid this: ? Eat a balanced diet, maintaining a consistent amount of Vitamin K. ? Notify your provider about major diet changes you plan to make. ? Avoid alcohol or limit your intake to 1 drink for women and 2 drinks for men per day. (1 drink is 5 oz. wine, 12 oz. beer, or 1.5 oz. liquor.)  Make sure that ANY health care provider who prescribes medication for you knows that you are taking Coumadin (warfarin).  Also make sure the  healthcare provider who is monitoring your Coumadin knows when you have started a new medication including herbals and non-prescription products.  Coumadin (Warfarin)  Major Drug Interactions  Increased Warfarin Effect Decreased Warfarin Effect  Alcohol (large quantities) Antibiotics (esp. Septra/Bactrim, Flagyl, Cipro) Amiodarone (Cordarone) Aspirin (ASA) Cimetidine (Tagamet) Megestrol (Megace) NSAIDs (ibuprofen, naproxen, etc.) Piroxicam (Feldene) Propafenone (Rythmol SR) Propranolol (Inderal) Isoniazid (INH) Posaconazole (Noxafil) Barbiturates (Phenobarbital) Carbamazepine (Tegretol) Chlordiazepoxide (Librium) Cholestyramine (Questran) Griseofulvin Oral Contraceptives Rifampin Sucralfate (Carafate) Vitamin K   Coumadin (Warfarin) Major Herbal Interactions  Increased Warfarin Effect Decreased Warfarin Effect  Garlic Ginseng Ginkgo biloba Coenzyme Q10 Green tea St. John's wort    Coumadin (Warfarin) FOOD Interactions  Eat a consistent number of servings per week of foods HIGH in Vitamin K (1 serving =  cup)  Collards (cooked, or boiled & drained) Kale (cooked, or boiled & drained) Mustard greens (cooked, or boiled & drained) Parsley *serving size only =  cup Spinach (cooked, or boiled & drained) Swiss chard (cooked, or boiled & drained) Turnip greens (cooked, or boiled & drained)  Eat a consistent number of servings per week of foods MEDIUM-HIGH in Vitamin K (1 serving = 1 cup)  Asparagus (cooked, or boiled & drained) Broccoli (cooked, boiled & drained, or raw & chopped) Brussel sprouts (cooked, or boiled & drained) *serving size only =  cup Lettuce, raw (green leaf, endive, romaine) Spinach, raw Turnip greens, raw & chopped   These websites have more information on Coumadin (warfarin):  www.coumadin.com; www.ahrq.gov/consumer/coumadin.htm;    

## 2019-04-09 NOTE — Progress Notes (Addendum)
[facility] DAILY ICU PROGRESS NOTE                   [address]            [place] [address]          [phone]   4 Days Post-Op Procedure(s) (LRB): TRANSESOPHAGEAL ECHOCARDIOGRAM (TEE) (N/A) Mitral Valve (Mv) Replacement using [employer]omedics Optiform Valve size 60m (N/A) Tricuspid Valve Repair using Edwards MC3 Tricuspid ring size 240m(N/A)  Total Length of Stay:  LOS: 4 days   Subjective: conts to slowly feel better/stronger. Less SOB  Objective: Vital signs in last 24 hours: Temp:  [97.6 F (36.4 C)-99 F (37.2 C)] 98.7 F (37.1 C) (07/27 0729) Pulse Rate:  [79-99] 84 (07/27 0700) Cardiac Rhythm: Normal sinus rhythm (07/27 0000) Resp:  [12-37] 20 (07/27 0700) BP: (92-132)/(53-83) 116/74 (07/27 0700) SpO2:  [92 %-100 %] 96 % (07/27 0700)  Filed Weights   04/06/19 0545 04/07/19 0500 04/08/19 0500  Weight: 109.6 kg 109.2 kg 106.9 kg    Weight change:    Hemodynamic parameters for last 24 hours:    Intake/Output from previous day: 07/26 0701 - 07/27 0700 In: 2246.8 [P.O.:2220; I.V.:26.8] Out: 1560 [Urine:1300; Chest Tube:260]  Intake/Output this shift: No intake/output data recorded.  Current Meds: Scheduled Meds: . acetaminophen  1,000 mg Oral Q6H  . aspirin EC  81 mg Oral Daily  . bisacodyl  10 mg Oral Daily   Or  . bisacodyl  10 mg Rectal Daily  . budesonide  1 mg Nebulization BID  . busPIRone  7.5 mg Oral TID  . chlorhexidine gluconate (MEDLINE KIT)  15 mL Mouth Rinse BID  . Chlorhexidine Gluconate Cloth  6 each Topical Daily  . Chlorhexidine Gluconate Cloth  6 each Topical Daily  . clonazePAM  0.5 mg Oral BID  . docusate sodium  200 mg Oral Daily  . furosemide  40 mg Intravenous BID  . insulin aspart  0-24 Units Subcutaneous Q4H  . mouth rinse  15 mL Mouth Rinse BID  . potassium chloride  20 mEq Oral Q4H  . QUEtiapine  50 mg Oral QHS  . sertraline  100 mg Oral Daily  . sodium chloride flush  10-40 mL Intracatheter Q12H  . sodium  chloride flush  3 mL Intravenous Q12H  . traZODone  25 mg Oral QHS  . warfarin  5 mg Oral q1800  . Warfarin - Physician Dosing Inpatient   Does not apply q1800   Continuous Infusions: . sodium chloride    . dexmedetomidine (PRECEDEX) IV infusion Stopped (04/06/19 1500)  . lactated ringers 10 mL/hr at 04/08/19 0600  . lactated ringers Stopped (04/08/19 0930)   PRN Meds:.metoprolol tartrate, morphine injection, oxyCODONE, sodium chloride flush, sodium chloride flush  General appearance: alert, cooperative and no distress Heart: regular rate and rhythm and crosp valve click Lungs: somewhat coarse throughout, dim in bases Abdomen: soft, nontender Extremities: trace edema Wound: dressings intact  Lab Results: CBC: Recent Labs    04/08/19 0317 04/09/19 0245  WBC 16.6* 10.7*  HGB 8.3* 8.3*  HCT 26.1* 26.0*  PLT 197 232   BMET:  Recent Labs    04/08/19 0317 04/09/19 0245  NA 135 136  K 3.9 3.3*  CL 104 102  CO2 24 26  GLUCOSE 96 73  BUN 14 10  CREATININE 0.89 0.75  CALCIUM 8.2* 8.3*    CMET: Lab Results  Component Value Date   WBC 10.7 (H) 04/09/2019  HGB 8.3 (L) 04/09/2019   HCT 26.0 (L) 04/09/2019   PLT 232 04/09/2019   GLUCOSE 73 04/09/2019   ALT 16 04/03/2019   AST 17 04/03/2019   NA 136 04/09/2019   K 3.3 (L) 04/09/2019   CL 102 04/09/2019   CREATININE 0.75 04/09/2019   BUN 10 04/09/2019   CO2 26 04/09/2019   TSH 3.797 03/04/2019   INR 1.7 (H) 04/09/2019   HGBA1C 5.9 (H) 04/03/2019    Dg Chest Port 1 View  Result Date: 04/08/2019 CLINICAL DATA:  38 year old female with history of atelectasis. EXAM: PORTABLE CHEST 1 VIEW COMPARISON:  Chest x-ray 04/07/2019. FINDINGS: Bilateral chest tubes appear similarly positions projecting over the mid to lower thorax bilaterally. Additional mediastinal drain projecting over the thorax near the midline. There is cephalization of the pulmonary vasculature and slight indistinctness of the interstitial markings  suggestive of mild pulmonary edema. No appreciable pneumothorax. No definite pleural effusions. Mild enlargement of the cardiopericardial silhouette, similar to recent prior examinations. Status post median sternotomy for tricuspid annuloplasty and mitral valve replacement. Mechanical mitral valve noted. Epicardial pacing wires noted. IMPRESSION: 1. Postoperative changes and support apparatus, as above. 2. Findings in the lungs suggest mild congestive heart failure, as above. Electronically Signed   By: Vinnie Langton M.D.   On: 04/08/2019 06:11    Pathology: Diagnosis 1. Lymph node, biopsy, anterior mediastinal - BENIGN LYMPH NODE - NEGATIVE FOR GRANULOMAS OR MALIGNANCY 2. Lymph node, biopsy, anterior mediastinal - BENIGN LYMPH NODE - NEGATIVE FOR GRANULOMAS OR MALIGNANCY 3. Heart valve leaflets, mitral - CARDIAC VALVE WITH FIBROMYXOID DEGENERATION 4. Medical device, removal, previous mitral valve ring - DEVICE CONSISTENT WITH CLINICALLY STATED CE PHYSIO RING, SIZE 30 MM, MITRAL VALVE RING. Jaquita Folds MD Pathologist, Electronic Signature (Case signed 04/06/2019) Intraoperative Diagnosis 1. RAPID INTRAOPERATIVE CONSULTATION: ANTERIOR MEDIASTINAL LYMPH NODE, FROZEN SECTION - NO GRANULOMATA IDENTIFIED. [facility]) Specimen Gross and Clinical Information Specimen(s) Obtained: 1. Lymph node, biopsy, anterior mediastinal 2. Lymph node, biopsy, anterior mediastinal 3. Heart valve leaflets, mitral 4. Medical device, removal, previous mitral valve ring  PT/INR:  Recent Labs    04/09/19 0245  LABPROT 19.8*  INR 1.7*   Radiology: No results found.   Assessment/Plan: S/P Procedure(s) (LRB): TRANSESOPHAGEAL ECHOCARDIOGRAM (TEE) (N/A) Mitral Valve (Mv) Replacement using Carbomedics Optiform Valve size 57m (N/A) Tricuspid Valve Repair using Edwards MC3 Tricuspid ring size 284m(N/A)  1 conts to make good progress POD#4 2 hemodyn stable off all pressors, inotropes, sinus rhythm with  some Bigemminy- add low dose metoprolol 3 sats good on 2 liters [place], cont rehab/pulm toilet. No new CXR today 4 CT 260 cc /24 hours- no air leak, d/c today 5 volume overload improving, lasix 40 IV today. Normal renal fxn/GFR 6 leukocytosis resolved 7 ABL anemia is stable 8 BS well controlled 9 cont current coumadin  WaJohn GiovanniA-C 04/09/2019 8:32 AM  Pager [phone]   I have seen and examined the patient and agree with the assessment and plan as outlined.  Doing very well.  Maintaining NSR w/ stable BP and breathing comfortably.  D/C tubes and wires.  Mobilize.  Transfer 4E.  Continue Coumadin.  ClRexene AlbertsMD 04/09/2019 11:50 AM

## 2019-04-09 NOTE — Progress Notes (Signed)
Pt received from [place]. Telebox 18 applied/ccmd notified. CHG bath given. Vitals stable. Pt oriented to room and call bell within reach. Will continue to monitor.  Jerald Kief, RN

## 2019-04-10 LAB — BASIC METABOLIC PANEL
Anion gap: 6 (ref 5–15)
BUN: 12 mg/dL (ref 6–20)
CO2: 26 mmol/L (ref 22–32)
Calcium: 8.6 mg/dL — ABNORMAL LOW (ref 8.9–10.3)
Chloride: 102 mmol/L (ref 98–111)
Creatinine, Ser: 0.74 mg/dL (ref 0.44–1.00)
GFR calc Af Amer: >60 mL/min (ref >60)
GFR calc non Af Amer: >60 mL/min (ref >60)
Glucose, Bld: 134 mg/dL — ABNORMAL HIGH (ref 70–99)
Potassium: 4.5 mmol/L (ref 3.5–5.1)
Sodium: 134 mmol/L — ABNORMAL LOW (ref 135–145)

## 2019-04-10 LAB — CBC
HCT: 25.6 % — ABNORMAL LOW (ref 36.0–46.0)
Hemoglobin: 8.2 g/dL — ABNORMAL LOW (ref 12.0–15.0)
MCH: 27.2 pg (ref 26.0–34.0)
MCHC: 32 g/dL (ref 30.0–36.0)
MCV: 84.8 fL (ref 80.0–100.0)
Platelets: 268 10*3/uL (ref 150–400)
RBC: 3.02 MIL/uL — ABNORMAL LOW (ref 3.87–5.11)
RDW: 18.7 % — ABNORMAL HIGH (ref 11.5–15.5)
WBC: 8.9 10*3/uL (ref 4.0–10.5)
nRBC: 2.5 % — ABNORMAL HIGH (ref 0.0–0.2)

## 2019-04-10 LAB — PROTIME-INR
INR: 1.8 — ABNORMAL HIGH (ref 0.8–1.2)
Prothrombin Time: 20.8 seconds — ABNORMAL HIGH (ref 11.4–15.2)

## 2019-04-10 LAB — GLUCOSE, CAPILLARY
Glucose-Capillary: 138 mg/dL — ABNORMAL HIGH (ref 70–99)
Glucose-Capillary: 119 mg/dL — ABNORMAL HIGH (ref 70–99)
Glucose-Capillary: 252 mg/dL — ABNORMAL HIGH (ref 70–99)
Glucose-Capillary: 129 mg/dL — ABNORMAL HIGH (ref 70–99)
Glucose-Capillary: 137 mg/dL — ABNORMAL HIGH (ref 70–99)

## 2019-04-10 MED ORDER — METOPROLOL TARTRATE 12.5 MG HALF TABLET
12.5000 mg | ORAL_TABLET | Freq: Three times a day (TID) | ORAL | Status: DC
  Administered 2019-04-10 – 2019-04-13 (×10): 12.5 mg via ORAL
  Filled 2019-04-10 (×10): qty 1

## 2019-04-10 MED ORDER — VITAMIN B-12 1000 MCG PO TABS
1000.0000 ug | ORAL_TABLET | Freq: Every day | ORAL | Status: DC
  Administered 2019-04-10 – 2019-04-13 (×4): 1000 ug via ORAL
  Filled 2019-04-10 (×4): qty 1

## 2019-04-10 MED ORDER — POTASSIUM CHLORIDE CRYS ER 20 MEQ PO TBCR
40.0000 meq | EXTENDED_RELEASE_TABLET | Freq: Every day | ORAL | Status: DC
  Administered 2019-04-10 – 2019-04-11 (×2): 40 meq via ORAL
  Filled 2019-04-10 (×2): qty 2

## 2019-04-10 MED ORDER — TORSEMIDE 20 MG PO TABS
40.0000 mg | ORAL_TABLET | Freq: Every day | ORAL | Status: DC
  Administered 2019-04-10: 40 mg via ORAL
  Filled 2019-04-10: qty 2

## 2019-04-10 MED FILL — Sodium Bicarbonate IV Soln 8.4%: INTRAVENOUS | Qty: 50 | Status: AC

## 2019-04-10 MED FILL — Potassium Chloride Inj 2 mEq/ML: INTRAVENOUS | Qty: 40 | Status: AC

## 2019-04-10 MED FILL — Electrolyte-R (PH 7.4) Solution: INTRAVENOUS | Qty: 3000 | Status: AC

## 2019-04-10 MED FILL — Heparin Sodium (Porcine) Inj 1000 Unit/ML: INTRAMUSCULAR | Qty: 60 | Status: AC

## 2019-04-10 MED FILL — Sodium Chloride IV Soln 0.9%: INTRAVENOUS | Qty: 4000 | Status: AC

## 2019-04-10 MED FILL — Lidocaine HCl Local Preservative Free (PF) Inj 2%: INTRAMUSCULAR | Qty: 15 | Status: AC

## 2019-04-10 MED FILL — Heparin Sodium (Porcine) Inj 1000 Unit/ML: INTRAMUSCULAR | Qty: 30 | Status: AC

## 2019-04-10 NOTE — Discharge Summary (Signed)
Physician Discharge Summary  Patient ID: Theresa DaneLatasha Y Crawford MRN: [id number] DOB/AGE: [birth year] 39 y.o.   Referring Provider is Jake BatheSkains, Mark C, MD Primary Cardiologist is Prentice DockerKoneswaran, Suresh, MD Advanced Heart Failure Cardiologist is Laurey MoraleMcLean, Dalton S, MD Primary Pulmonologist is Icard, Rachel BoBradley L, MD PCP is Grayce SessionsEdwards, Michelle P, NP   Admit date: 04/05/2019 Discharge date: 04/13/2019  Admission Diagnoses: Recurrent mitral valve stenosis S/P mitral valve repair Tricuspid valve insufficiency History of major depression History of Iron deficiency anemia Hypertension Pulmonary hypertension Chronic diastolic congestive heart failure Sarcoidosis Obesity (BMI30-39.9)    Discharge Diagnoses:    S/P redo mitral valve replacement with mechanical valve     S/P tricuspid valve repair  Recurrent mitral valve stenosis S/P mitral valve repair Tricuspid valve insufficiency History of major depression History of Iron deficiency anemia Hypertension Pulmonary hypertension Chronic diastolic congestive heart failure Sarcoidosis Obesity (BMI30-39.9)    Discharged Condition: stable   HPI:  Patient is an obese 38 year old African-American female with long standing history of mitral valve disease and chronic diastolic congestive heart failure status post mitral valve repair in 2014.  Patient states that her heart disease began approximately10years ago when she was pregnant with her second child. She states that she developed pericarditis and congestive heart failure. She was eventually diagnosed with mitral valve disease and sarcoidosis. She underwent mitral valve repair in 2014. At the time [place]By report her surgical repair included "resuspension of the posterior leaflet" and placement of a complete, semi-rigid CE Physio annuloplasty ring, size 30 mm.Following surgery she states that she quickly began to experience worsening problems of congestive heart failure.  Within a year following surgery she had been hospitalized on several occasions with acute exacerbation of chronic diastolic congestive heart failure. She was referred back to her cardiac surgeon and the possibility of redo surgery was discussed. [place] in January [place]. She has been followed intermittently since then by Dr. Purvis SheffieldKoneswaran, and she has been hospitalized on numerous occasions with acute exacerbation of chronic diastolic congestive heart failure and pulmonary edema. Follow-up echocardiograms have documented the presence of normal left ventricular systolic function with at least mild mitral regurgitation and increased transvalvular gradients consistent with moderate to severe mitral stenosis.I had the opportunity to see her in consultation on 03/20/2018 and we discussed the indications, risks and potential benefits of redo mitral valve repair or replacement with possible tricuspid valve repair. Continued medical therapy was discussed as an alternative, particularly given the likelihood that her mitral valve would need to be replaced at the time of surgery. She expressed interest in proceeding with further diagnostic testing and surgery and was referred for dental clearance and diagnostic cardiac catheterization. She underwent dental extraction and was scheduled for catheterization, but she cancelled at the last minute. By report she had a "breakdown" and [facility] with suicidal ideation.  Ultimately the patient's psychiatric problems were brought under improved control.  Over the past 6 months the patient has had 3 different hospitalizations for acute exacerbation of shortness of breath.  1 month ago she was hospitalized with severe resting shortness of breath and hypoxemic respiratory failure with pulmonary edema on chest x-ray.  I had the opportunity to see her in follow-up at that time.  She was also evaluated by Dr. Tonia BroomsIcard from  pulmonary medicine regarding likely diagnosis of sarcoidosis.  She did well with diuretic therapy and was discharged from the hospital with plans to proceed with elective surgical intervention this  month.  However, the patient was readmitted to the hospital again March 24, 2019 with another acute exacerbation of resting shortness of breath with increased cough.  She was evaluated by the advanced heart failure team and underwent repeat right heart catheterization by Dr. Shirlee Latch.  She was also seen by the pharmacy team and counseled regarding what it would be like for her to be treated using warfarin for long-term anticoagulation.  Plans were made to proceed with elective redo mitral valve replacement and tricuspid valve repair later this week.  She returns to the office today with her mother present for follow-up consultation.  She reports no new problems or complaints.  Her breathing has been stable since hospital discharge.  Her weight is been stable.  She still has an intermittent dry nonproductive cough.  She denies fevers or chills.  She has been practicing social distancing and wearing a facemask whenever out of the house.  Patient is single and lives with her mother in [place]. She is unemployed. She has 2 children that do not live with her, ages 22 and 73. She describes chronic symptoms of exertional shortness of breath, orthopnea, lower extremity edema, and a chronic cough which have waxed and waned in severity but she states began immediately after her surgery in 2014. She has been hospitalized on numerous occasions for acute exacerbations of chronic congestive heart failure and symptoms always improve following intravenous diuresis. She states that recently at home she has been taking Bumex regularly but despite this she has problems with fluid retention. She has chronic cough with occasional episodes of frank mopped assist. She reports some occasional tightness across her chest which is  typically transient and fleeting and not necessarily associated with shortness of breath. Tightness in her chest is not related to physical exertion. She admits to problems with chronic anxiety including the fact that she was recently hospitalized at behavioral health because of extreme anxiety with suicidal ideation. She states that she quit smoking approximately 1 month ago and she denies any illicit drug use.  Hospital Course:  Theresa Crawford was admitted for same-day surgery on 04/03/19 an taken to the OR where re-do sternotomy was performed followed by mitral valve replacement with a Carbomedics mechanical valve and tricuspid valve annuloplasty with and Edwards MC3 ring.  Following the procedure, she separated from cardiopulmonary bypass on inhaled nitric oxide, milrinone, and epinephrine.  She was hemodynamically stable and was transferred to the CVICU.  She was extubated on the first pos-op day.  The inotropic support was slowly weaned off and this was well tolerated. She was mobilized with PT and made appropriate progress with mobility. Anticoagulation with Coumadin was initiated and daily INR's were monitored. Her last INR was up to 2.5. Per Dr. Cornelius Moras, she is to continue on Coumadin 5 mg daily. She was diuresed with IV lasix initially and this was later transitioned back to her torsemide as she was taking this pre-op. She was transferred to progressive care on 4E. As discussed with Dr. Cornelius Moras she was ready for discharge on 07/31.   At that time, she was independent with her mobility and tolerating a cardiac diet with no trouble. She did desat with ambulation on 07/30 and was put on 1 liter of oxygen. She was rechecked today and will need 1 iter of oxygen via at discharge.The sternal incision was healing with no sign of complication. Of note, there was a small area inferior sternal wound where skin edge exposed with minor serous drainage but no  signs of infection. As discussed with Dr. Cornelius Moras, she will be  discharged on Lopressor 12.5 mg bid (changed to Toprol XL as she had taken this prior to surgery), Demadex 40 mg daily with a potassium supplement, Spironolactone 25 mg daily, and Coumadin. At patient request, prescriptions were filled by [facility] pharmacy. Chest tube sutures will remain and be removed in the office.  Significant Diagnostic Studies:   CT ANGIOGRAPHY CHEST WITH CONTRAST  TECHNIQUE:  Multidetector CT imaging of the chest was performed using the  standard protocol during bolus administration of intravenous  contrast. Multiplanar CT image reconstructions and MIPs were  obtained to evaluate the vascular anatomy.  CONTRAST: 75mL ISOVUE-370 IOPAMIDOL (ISOVUE-370) INJECTION 76% COMPARISON: Chest x-ray from earlier in the same day, CT of the chest from 03/30/2018  FINDINGS: Cardiovascular: Thoracic aorta demonstrates a normal branching pattern. No aneurysmal dilatation or dissection is seen. Mild cardiac enlargement is noted. Mitral valve repair is seen. The pulmonary artery shows a normal branching pattern. No filling defect to suggest pulmonary embolism is identified. No significant coronary  calcifications are seen. Mediastinum/Nodes: The esophagus is within normal limits. The thoracic inlet shows no acute abnormality. Multiple lymph nodes are  noted throughout the mediastinum particularly in the superior and anterior mediastinum which are consistent with the patient's given clinical history of sarcoidosis. Some of these demonstrate diffuse calcification. The overall bulk of the lymphadenopathy has increased in the interval from the prior exam. Subcarinal lymphadenopathy is also noted which has increased in the interval from the prior exam.  Lungs/Pleura: Diffuse ground-glass attenuation is noted throughout the lungs bilaterally. Some increased parenchymal scarring is noted in the left lower lobe when compared with the prior exam. Scattered calcified granulomas are noted consistent with the given  clinical history. Diffuse septal thickening consistent with a degree of CHF  is noted. No focal confluent infiltrate is seen. No sizable effusion is noted.  Upper Abdomen: Visualized upper abdomen is within normal limits.  Musculoskeletal: Mild degenerative changes of the thoracic spine are noted. No acute bony abnormality is seen.  Review of the MIP images confirms the above findings.  IMPRESSION:  Changes consistent with the given clinical history of sarcoidosis with mediastinal adenopathy and evidence of granulomatous disease.  Additionally a portion of the changes in the lung are felt to be  related to the underlying sarcoidosis particularly in the lower  lobes bilaterally.  Diffuse ground-glass opacity as well as septal thickening consistent  with a degree of superimposed congestive failure.  No evidence of pulmonary embolism.  Electronically Signed  By: Alcide Clever M.D.  On: 11/06/2018 07:51     ECHOCARDIOGRAM REPORT  Patient Name: Theresa Crawford Date of Exam: 03/04/2019  Medical Rec #: [id number] Height: 64.0 in  Accession #: [id number] Weight: 221.0 lb  Date of Birth: 01-Apr-1981 BSA: 2.04 m  Patient Age: 59 years BP: 110/69 mmHg  Patient Gender: F HR: 86 bpm.  Exam Location: Inpatient  Procedure: 2D Echo  Indications: CHF 428  History: Patient has prior history of Echocardiogram examinations, most  recent 09/10/2017. CHF. S/p MV repair. tobacco abuse. pulmonary  htn. MS. Mitral Valve Disease.  Sonographer: Celene Skeen RDCS (AE)  Referring Phys: [id number] JAMES KIM  IMPRESSIONS  1. The left ventricle has normal systolic function with an ejection fraction of 60-65%. The cavity size was normal. Left ventricular diastolic Doppler parameters are consistent with pseudonormalization. There is right ventricular volume and pressure  overload. No evidence of left ventricular regional wall motion abnormalities.  2. The right ventricle has normal systolic function. The cavity was  moderately enlarged. There is no increase in right ventricular wall thickness. Right ventricular systolic pressure is severely elevated with an estimated pressure of 88.1 mmHg.  3. Left atrial size was moderately dilated.  4. The mitral valve is myxomatous. Moderate thickening of the mitral valve leaflet. Severe mitral valve stenosis.  5. Post mitral valve repair, annuloplasty ring. Post repair mitral stenosis (mean gradient - severe).  6. Tricuspid valve regurgitation is moderate.  7. The aortic valve is tricuspid. Aortic valve regurgitation was not assessed by color flow Doppler.  8. The inferior vena cava was dilated in size with <50% respiratory variability.  FINDINGS  Left Ventricle: The left ventricle has normal systolic function, with an ejection fraction of 60-65%. The cavity size was normal. There is no increase in left ventricular wall thickness. Left ventricular diastolic Doppler parameters are consistent with  pseudonormalization. There is the interventricular septum is flattened in systole and diastole, consistent with right ventricular pressure and volume overload. No evidence of left ventricular regional wall motion abnormalities..  Right Ventricle: The right ventricle has normal systolic function. The cavity was moderately enlarged. There is no increase in right ventricular wall thickness. Right ventricular systolic pressure is severely elevated with an estimated pressure of 88.1  mmHg.  Left Atrium: Left atrial size was moderately dilated.  Right Atrium: Right atrial size was normal in size. Right atrial pressure is estimated at 15 mmHg.  Interatrial Septum: No atrial level shunt detected by color flow Doppler.  Pericardium: There is no evidence of pericardial effusion.  Mitral Valve: The mitral valve is myxomatous. Moderate thickening of the mitral valve leaflet. Mitral valve regurgitation is trivial by color flow Doppler. Severe mitral valve stenosis. Post mitral valve  repair, annuloplasty ring. Post repair mitral  stenosis (mean gradient - severe).  Tricuspid Valve: The tricuspid valve is normal in structure. Tricuspid valve regurgitation is moderate by color flow Doppler.  Aortic Valve: The aortic valve is tricuspid Aortic valve regurgitation was not assessed by color flow Doppler. There is no evidence of aortic valve stenosis.  Pulmonic Valve: The pulmonic valve was normal in structure. Pulmonic valve regurgitation was not assessed by color flow Doppler.  Venous: The inferior vena cava is dilated in size with less than 50% respiratory variability.  +--------------+--------++   LEFT VENTRICLE     +--------------+---------++  +--------------+--------++  Diastology        PLAX 2D      +--------------+---------++  +--------------+--------++  LV e' lateral: 8.92 cm/s     LVIDd:  4.17 cm    +--------------+---------++  +--------------+--------++   LVIDs:  2.89 cm     +--------------+--------++   LV PW:  1.02 cm     +--------------+--------++   LV IVS:  0.91 cm     +--------------+--------++   LVOT diam:  1.90 cm     +--------------+--------++   LV SV:  45 ml     +--------------+--------++   LV SV Index:  20.83     +--------------+--------++   LVOT Area:  2.84 cm    +--------------+--------++          +--------------+--------++  +---------------+---------++   RIGHT VENTRICLE      +---------------+---------++   RV Mid diam:  4.51 cm     +---------------+---------++   RVSP:  88.1 mmHg    +---------------+---------++  +---------------+-------++-----------++   LEFT ATRIUM     Index     +---------------+-------++-----------++   LA diam:  4.60 cm  2.25 cm/m     +---------------+-------++-----------++   LA Vol (A2C):  50.8 ml  24.89 ml/m    +---------------+-------++-----------++   LA Vol (A4C):  33.6 ml  16.46 ml/m    +---------------+-------++-----------++   LA Biplane Vol: 43.9 ml  21.51 ml/m      +---------------+-------++-----------++  +------------+----------++-----------++   RIGHT ATRIUM    Index     +------------+----------++-----------++   RA Pressure: 15.00 mmHg       +------------+----------++-----------++   RA Area:  12.60 cm        +------------+----------++-----------++   RA Volume:  27.90 ml   13.67 ml/m    +------------+----------++-----------++  +------------+-----------++   AORTIC VALVE      +------------+-----------++   LVOT Vmax:  102.00 cm/s    +------------+-----------++   LVOT Vmean:  62.100 cm/s    +------------+-----------++   LVOT VTI:  0.183 m     +------------+-----------++  +-------------+-------++   AORTA       +-------------+-------++   Ao Root diam: 2.40 cm    +-------------+-------++  +-------------+----------++ +---------------+-----------++   MITRAL VALVE       TRICUSPID VALVE      +-------------+----------++ +---------------+-----------++   MV Peak grad: 19.1 mmHg     TR Peak grad:  73.1 mmHg     +-------------+----------++ +---------------+-----------++   MV Mean grad: 12.0 mmHg     TR Vmax:  453.00 cm/s    +-------------+----------++ +---------------+-----------++   MV Vmax:  2.18 m/s     Estimated RAP:  15.00 mmHg     +-------------+----------++ +---------------+-----------++   MV Vmean:  163.0 cm/s    RVSP:  88.1 mmHg     +-------------+----------++ +---------------+-----------++   MV VTI:  0.58 m     +-------------+----------++ +--------------+-------+   SHUNTS      +--------------+-------+   Systemic VTI:  0.18 m    +--------------+-------+   Systemic Diam: 1.90 cm   +--------------+-------+  Donato Schultz MD  Electronically signed by Donato Schultz MD  Signature Date/Time: 03/04/2019/12:47:37 PM    TRANSESOPHOGEAL ECHO REPORT  Patient Name: Theresa Crawford Date of Exam: 03/05/2019  Medical Rec #: [id number] Height: 64.0 in  Accession #: [id number] Weight: 213.6 lb  Date of Birth: 01-Dec-1980 BSA: 2.01 m  Patient Age: 5  years BP: 114/58 mmHg  Patient Gender: F HR: 71 bpm.  Exam Location: Inpatient  Procedure: Transesophageal Echo  Indications: Mitral stenosis  History: Patient has prior history of Echocardiogram examinations, most  recent 03/04/2019. CHF Pulmonary HTN MV repair and Mitral Valve  Disease Signs/Symptoms: Shortness of Breath Risk Factors:  Hypertension and Current Smoker.  Sonographer: Ross Ludwig RDCS (AE)  Referring Phys: [id number] Jake Bathe  Diagnosing Phys: Dietrich Pates MD  PROCEDURE: The transesophogeal probe was passed through the esophogus of the patient. The patient developed no complications during the procedure.  IMPRESSIONS  1. The left ventricle has mildly reduced systolic function, with an ejection fraction of 45-50%. The cavity size was normal.  2. The right ventricle has mildly reduced systolic function. The cavity was mildly enlarged.  3. LA, LAA without masses.  4. S/P Mitral valve repair. 2D and 3D imaging done. The mitral leaflet is thickened with restricted motion, particularly the posterior leaflet. Peak and mean gradients through the valve are 13 and 8 mm Hg respectively. MVA by Pressure T1/2 is 1.85 cm2  consistent with mild MS 2D imaging suggests more moderate.  5. Tricuspid valve regurgitation is mild-moderate.  6. The aortic valve  is tricuspid Aortic valve regurgitation is trivial by color flow Doppler.  FINDINGS  Left Ventricle: The left ventricle has mildly reduced systolic function, with an ejection fraction of 45-50%. The cavity size was normal.  Right Ventricle: The right ventricle has mildly reduced systolic function. The cavity was mildly enlarged.  Left Atrium: LA, LAA without masses.  Mitral Valve: S/P Mitral valve repair. 2D and 3D imaging done. The mitral leaflet is thickened with restricted motion, particularly the posterior leaflet. Peak and mean gradients through the valve are 13 and 8 mm Hg respectively. MVA by Pressure T1/2 is  1.85 cm2 consistent with  mild MS 2D imaging suggests more moderate.  Tricuspid Valve: The tricuspid valve was normal in structure. Tricuspid valve regurgitation is mild-moderate by color flow Doppler.  Aortic Valve: The aortic valve is tricuspid Aortic valve regurgitation is trivial by color flow Doppler.  +-------------+----------++   MITRAL VALVE       +-------------+----------++   MV Peak grad: 13.5 mmHg     +-------------+----------++   MV Mean grad: 8.0 mmHg     +-------------+----------++   MV Vmax:  1.84 m/s     +-------------+----------++   MV Vmean:  136.0 cm/s    +-------------+----------++   MV VTI:  0.46 m     +-------------+----------++  Dietrich Pates MD  Electronically signed by Dietrich Pates MD  Signature Date/Time: 03/05/2019/5:45:09 PM     RIGHT/LEFT HEART CATH AND CORONARY ANGIOGRAPHY  Conclusion   Prox RCA to Mid RCA lesion is 10% stenosed.   Prox Cx to Mid Cx lesion is 20% stenosed.   Mid LAD lesion is 10% stenosed. 1. Mild non-obstructive CAD   Recommendations  Antiplatelet/Anticoag No further ischemic workup. Proceed with evaluation for valve surgery.  Surgeon Notes    03/05/2019 2:28 PM CV Procedure signed by Pricilla Riffle, MD  Indications  Severe mitral valve stenosis [I05.0 (ICD-10-CM)]  Procedural Details  Technical Details Indication: Severe Mitral stenosis  Procedure: The risks, benefits, complications, treatment options, and expected outcomes were discussed with the patient. The patient and/or family concurred with the proposed plan, giving informed consent. The patient was brought to the cath lab after IV hydration was given. The patient was sedated with Versed and Fentanyl. The left wrist was prepped and draped in a sterile fashion. 1% lidocaine was used for local anesthesia. Using the modified Seldinger access technique, a 5 French sheath was placed in the left radial artery. 3 mg Verapamil was given through the sheath. 5000 units IV heparin was given. Standard diagnostic  catheters were used to perform selective coronary angiography. A pigtail catheter was used to cross the aortic valve. LV pressures measured. No LV gram performed. The sheath was removed from the left radial artery and a Terumo hemostasis band was applied at the arteriotomy site on the left wrist.    Estimated blood loss <50 mL.   During this procedure medications were administered to achieve and maintain moderate conscious sedation while the patient's heart rate, blood pressure, and oxygen saturation were continuously monitored and I was present face-to-face 100% of this time.  Medications  (Filter: Administrations occurring from 03/05/19 1509 to 03/05/19 1656)           (important) Continuous medications are totaled by the amount administered until 03/05/19 1656.  Medication Rate/Dose/Volume Action  Date Time    Heparin (Porcine) in NaCl 1000-0.9 UT/500ML-% SOLN (mL) 500 mL Given 03/05/19 1512    Total dose as of 03/05/19 1656 500 mL Given 1512  1,000 mL         diphenhydrAMINE (BENADRYL) injection (mg) 50 mg Given 03/05/19 1513    Total dose as of 03/05/19 1656         50 mg         midazolam (VERSED) injection (mg) 1 mg Given 03/05/19 1521    Total dose as of 03/05/19 1656 1 mg Given 1538    2 mg         fentaNYL (SUBLIMAZE) injection (mcg) 25 mcg Given 03/05/19 1521    Total dose as of 03/05/19 1656 25 mcg Given 1539    50 mcg         lidocaine (PF) (XYLOCAINE) 1 % injection (mL) 2 mL Given 03/05/19 1528    Total dose as of 03/05/19 1656 2 mL Given 1533    14 mL 10 mL Given 1551    Radial Cocktail/Verapamil only (mL) 10 mL Canceled Entry 03/05/19 1533    Total dose as of 03/05/19 1656 10 mL Given 1612    10 mL         heparin injection (Units) 5,000 Units Given 03/05/19 1618    Total dose as of 03/05/19 1656         5,000 Units         iohexol (OMNIPAQUE) 350 MG/ML injection (mL) 55 mL  Given 03/05/19 1637    Total dose as of 03/05/19 1656         55 mL         0.9 % sodium chloride infusion (mL/hr) 999 mL/hr Rate/Dose Change 03/05/19 1610    Dosing weight: 100.2 kg         Total dose as of 03/05/19 1656         778.39 mL         Sedation Time  Sedation Time Physician-1: 1 hour 10 minutes 59 seconds  Complications     Complications documented before study signed (03/05/2019 4:57 PM)   RIGHT/LEFT HEART CATH AND CORONARY ANGIOGRAPHY   None Documented by Burnell Blanks, MD 03/05/2019 4:38 PM  Date Found: 03/05/2019  Time Range: Intraprocedure    Coronary Findings  Diagnostic  Dominance: Right  Left Anterior Descending  Vessel is large.  Mid LAD lesion 10% stenosed  Mid LAD lesion is 10% stenosed.  Left Circumflex  Vessel is large.  Prox Cx to Mid Cx lesion 20% stenosed  Prox Cx to Mid Cx lesion is 20% stenosed.  Right Coronary Artery  Vessel is large.  Prox RCA to Mid RCA lesion 10% stenosed  Prox RCA to Mid RCA lesion is 10% stenosed.  Intervention  No interventions have been documented.  Coronary Diagrams  Diagnostic  Dominance: Right         RIGHT HEART CATH  Conclusion  1. Mildly elevated PCWP.  2. Mild pulmonary venous hypertension, PVR 2.2 WU.  3. Preserved cardiac output.   There appears to be minimal fixed pulmonary hypertension, it does not appear that there is a significant PAH component from sarcoidosis. This would suggest that CHF is primarily related to mitral stenosis.   Surgeon Notes    03/05/2019 2:28 PM CV Procedure signed by Fay Records, MD  Procedural Details  Technical Details Procedure: Right Heart Cath  Indication: CHF, pulmonary hypertension   Procedural Details: The right brachial area was prepped, draped, and anesthetized with 1% lidocaine. There was a pre-existing peripheral IV that was replaced with a 49F venous sheath. A Swan-Ganz catheter  was used for the right  heart catheterization. Standard protocol was followed for recording of right heart pressures and sampling of oxygen saturations. Fick cardiac output was calculated. There were no immediate procedural complications. The patient was transferred to the post catheterization recovery area for further monitoring.  Estimated blood loss <50 mL.   During this procedure medications were administered to achieve and maintain moderate conscious sedation while the patient's heart rate, blood pressure, and oxygen saturation were continuously monitored and I was present face-to-face 100% of this time.   Right Heart  Right Heart Pressures RHC Procedural Findings: Hemodynamics (mmHg) RA mean 8 RV 50/10 PA 47/22, mean 32 PCWP mean 17  Oxygen saturations: PA 69% AO 99%  Cardiac Output (Fick) 6.69  Cardiac Index (Fick) 3.23 PVR 2.24 WU  Implants      No implant documentation for this case.  Syngo Images  Show images for CARDIAC CATHETERIZATION  Images on Long Term Storage  Show images for Vania, Rosero to Procedure Log  Procedure Log    Hemo Data   Most Recent Value  Fick Cardiac Output 6.69 L/min  Fick Cardiac Output Index 3.23 (L/min)/BSA  RA A Wave 4 mmHg  RA V Wave 11 mmHg  RA Mean 8 mmHg  RV Systolic Pressure 50 mmHg  RV Diastolic Pressure 1 mmHg  RV EDP 10 mmHg  PA Systolic Pressure 47 mmHg  PA Diastolic Pressure 22 mmHg  PA Mean 32 mmHg  PW A Wave 18 mmHg  PW V Wave 21 mmHg  PW Mean 17 mmHg  QP/QS 1  TPVR Index 9.91 HRUI    Treatments:   CARDIOTHORACIC SURGERY OPERATIVE NOTE  Date of Procedure:                04/05/2019  Preoperative Diagnosis:        Mitral Stenosis and Recurrent Mitral Regurgitation S/P Mitral Valve Repair  Tricuspid Regurgitation  Pulmonary Hypertension  Sarcoidosis  Postoperative Diagnosis:    Same   Procedure:        Mitral Valve Replacement             Redo median sternotomy             Sorin Carbomedics  Optiform bileaflet mechanical valve (size 29mm, catalog #[id number], serial #[id number])   Tricuspid Valve Repair             Edwards mc3 ring annuloplasty (size 28mm, catalog #[id number], serial [id number])   Biopsy of Anterior Mediastinal Lymph Node   Surgeon:        Salvatore Decent. Cornelius Moras, MD  Assistant:       Jillyn Hidden, PA-C  Anesthesia:    Kipp Brood, MD  Operative Findings:  Rheumatic heart disease  Moderate-severe mitral stenosis  Type IIIA mitral valve dysfunction with moderate mitral regurgitation  Moderate tricuspid regurgiation  Normal LV systolic function  Severe pulmonary hypertension  RV hypertrophy and chamber enlargement due to pressure overload  Diffuse chronic inflammation throughout both lungs and mediastinus  Severe lymphadenopathy                  BRIEF CLINICAL NOTE AND INDICATIONS FOR SURGERY  Patient is an obese 38 year old African-American female with long standing history of mitral valve disease and chronic diastolic congestive heart failure status post mitral valve repair in 2014 whoreturns to the office today with tentative plans to proceed with redo mitral valve replacement and tricuspid valve repair later this week.  Patient states that her  heart disease began approximately10years ago when she was pregnant with her second child. She states that she developed pericarditis and congestive heart failure. She was eventually diagnosed with mitral valve disease and sarcoidosis. She underwent mitral valve repair in 2014. At the time she [place] in [place].By report her surgical repair included "resuspension of the posterior leaflet" and placement of a complete, semi-rigid CE Physio annuloplasty ring, size 30 mm.Following surgery she states that she quickly began to experience worsening problems of congestive heart failure. Within a year following surgery she had been hospitalized on several occasions with acute  exacerbation of chronic diastolic congestive heart failure. She was referred back to her cardiac surgeon and the possibility of redo surgery was discussed. [place] in January [place]. She has been followed intermittently since then by Dr. Purvis SheffieldKoneswaran, and she has been hospitalized on numerous occasions with acute exacerbation of chronic diastolic congestive heart failure and pulmonary edema. Follow-up echocardiograms have documented the presence of normal left ventricular systolic function with at least mild mitral regurgitation and increased transvalvular gradients consistent with moderate to severe mitral stenosis.I had the opportunity to see her in consultation on 03/20/2018 and we discussed the indications, risks and potential benefits of redo mitral valve repair or replacement with possible tricuspid valve repair. Continued medical therapy was discussed as an alternative, particularly given the likelihood that her mitral valve would need to be replaced at the time of surgery. She expressed interest in proceeding with further diagnostic testing and surgery and was referred for dental clearance and diagnostic cardiac catheterization. She underwent dental extraction and was scheduled for catheterization, but she cancelled at the last minute. By report she had a "breakdown" and [facility] with suicidal ideation.Ultimately the patient's psychiatric problems were brought under improved control.  Over the past 6 months the patient has had 3 different hospitalizations for acute exacerbation of shortness of breath. 1 month ago she was hospitalized with severe resting shortness of breath and hypoxemic respiratory failure with pulmonary edema on chest x-ray. I had the opportunity to see her in follow-up at that time. She was also evaluated by Dr. Neldon McIcardfrom pulmonary medicine regarding likely diagnosis of sarcoidosis. She did well with diuretic  therapy and was discharged from the hospital with plans to proceed with elective surgical intervention this month. However, the patient was readmitted to the hospital again March 24, 2019 with another acute exacerbation of resting shortness of breath with increased cough. She was evaluated by the advanced heart failure team and underwent repeat right heart catheterization by Dr. Shirlee LatchMcLean. She was also seen by the pharmacy team and counseled regarding what it would be like for her to be treated using warfarin for long-term anticoagulation. Plans were made to proceed with elective redo mitral valve replacement and tricuspid valve repair later this week. She returns to the office today with her mother present for follow-up consultation. She reports no new problems or complaints. Her breathing has been stable since hospital discharge. Her weight is been stable. She still has an intermittent dry nonproductive cough. She denies fevers or chills. She has been practicing social distancing and wearing a facemask whenever out of the house.  Patient is single and lives with her mother in [place]. She is unemployed. She has 2 children that do not live with her, ages 2815 and 2510. She describes chronic symptoms of exertional shortness of breath, orthopnea, lower extremity edema, and a chronic cough which have waxed and waned in severity  but she states began immediately after her surgery in 2014. She has been hospitalized on numerous occasions for acute exacerbations of chronic congestive heart failure and symptoms always improve following intravenous diuresis. She states that recently at home she has been taking Bumex regularly but despite this she has problems with fluid retention. She has chronic cough with occasional episodes of frank mopped assist. She reports some occasional tightness across her chest which is typically transient and fleeting and not necessarily associated with shortness of breath.  Tightness in her chest is not related to physical exertion. She admits to problems with chronic anxiety including the fact that she was recently hospitalized at behavioral health because of extreme anxiety with suicidal ideation. She states that she quit smoking approximately 1 month ago and she denies any illicit drug use.  The patient and her mother have been seen in consultation and counseled at length regarding the indications, risks and potential benefits of surgery.  The need for mitral valve replacement was discussed including the fact that at her age use of a mechanical prosthetic valve would be recommended but require lifelong anticoagulation using warfarin.  The patient was seen in consultation by the pharmacy team during her most recent hospitalization and a brief trial of oral warfarin anticoagulation was initiated.  The patient and her mother both feel that she can remain compliant with long-term warfarin anticoagulation.  All questions have been answered, and the patient provides full informed consent for the operation as described.    Postoperative Pathology Report  PREPORT OF SURGICAL PATHOLOGYFINAL ost-operative Path IAGNOSIS Diagnosis 1. Lymph node, biopsy, anterior mediastinal - BENIGN LYMPH NODE - NEGATIVE FOR GRANULOMAS OR MALIGNANCY 2. Lymph node, biopsy, anterior mediastinal - BENIGN LYMPH NODE - NEGATIVE FOR GRANULOMAS OR MALIGNANCY 3. Heart valve leaflets, mitral - CARDIAC VALVE WITH FIBROMYXOID DEGENERATION 4. Medical device, removal, previous mitral valve ring - DEVICE CONSISTENT WITH CLINICALLY STATED CE PHYSIO RING, SIZE 30 MM, MITRAL VALVE RING. Holley Bouche MD Pathologist, Electronic Signature (Case signed 04/06/2019) Discharge Exam: Blood pressure (!) 96/53, pulse 67, temperature 98.5 F (36.9 C), temperature source Oral, resp. rate 18, height 5\' 4"  (1.626 m), weight 101.9 kg, last menstrual period 03/29/2019, SpO2 99 %.   Cardiovascular: RRR, no  murmur  Pulmonary: Mostly clear bilaterally  Abdomen: Soft, non tender, bowel sounds present.  Extremities: Trace bilateral lower extremity edema.  Wounds: Clean and dry. No erythema or signs of infection.  Lower edge of sternal wound with slight serous drainage (right skin edge exposed)   Disposition: Discharge disposition: 01-Home or Self Care       Discharge Instructions    Amb Referral to Cardiac Rehabilitation   Complete by: As directed    [email or web address]   Diagnosis:  Valve Repair Valve Replacement     Valve:  Tricuspid Aortic     After initial evaluation and assessments completed: Virtual Based Care may be provided alone or in conjunction with Phase 2 Cardiac Rehab based on patient barriers.: Yes     Allergies as of 04/13/2019      Reactions   Bee Venom Anaphylaxis   Lisinopril Anaphylaxis, Shortness Of Breath, Swelling, Other (See Comments)   Throat swells   Penicillins Shortness Of Breath, Swelling, Other (See Comments)   Has patient had a PCN reaction causing immediate rash, facial/tongue/throat swelling, SOB or lightheadedness with hypotension: Yes Has patient had a PCN reaction causing severe rash involving mucus membranes or skin necrosis: Yes Has patient had a PCN reaction that required hospitalization  No Has patient had a PCN reaction occurring within the last 10 years: No If all of the above answers are "NO", then may proceed with Cephalosporin use.   Perflutren Lipid Microsphere Shortness Of Breath, Other (See Comments)   Chest Pain/Tightness, also   Shellfish Allergy Anaphylaxis   Contrast Media [iodinated Diagnostic Agents] Hives, Itching, Other (See Comments)   Itching and hives to throat, neck, face and arms.   No difficulty breathing.   This was given at [facility] approximately 2015. Contrast Dye      Medication List    TAKE these medications   acetaminophen 325 MG tablet Commonly known as: TYLENOL Take 2 tablets (650 mg  total) by mouth every 6 (six) hours as needed for mild pain.   albuterol 108 (90 Base) MCG/ACT inhaler Commonly known as: VENTOLIN HFA Inhale 2 puffs into the lungs every 6 (six) hours as needed for wheezing or shortness of breath.   aspirin 81 MG EC tablet Take 1 tablet (81 mg total) by mouth daily.   budesonide 180 MCG/ACT inhaler Commonly known as: PULMICORT Inhale 1 puff into the lungs 2 (two) times a day.   busPIRone 7.5 MG tablet Commonly known as: BUSPAR Take 1 tablet (7.5 mg total) by mouth 3 (three) times daily.   clonazePAM 0.5 MG tablet Commonly known as: KLONOPIN Take 1 tablet (0.5 mg total) by mouth 2 (two) times daily.   docusate sodium 100 MG capsule Commonly known as: COLACE Take 100 mg by mouth daily as needed for moderate constipation.   EpiPen 2-Pak 0.3 mg/0.3 mL Soaj injection Generic drug: EPINEPHrine Inject 0.3 Units into the muscle once.   ferrous sulfate 325 (65 FE) MG tablet Take 1 tablet (325 mg total) by mouth 2 (two) times daily with a meal. What changed: when to take this   folic acid 800 MCG tablet Commonly known as: FOLVITE Take 400 mcg by mouth daily.   hydrOXYzine 25 MG capsule Commonly known as: Vistaril Take 1 capsule (25 mg total) by mouth 3 (three) times daily as needed.   metoprolol succinate 25 MG 24 hr tablet Commonly known as: TOPROL-XL Take 1 tablet (25 mg total) by mouth daily. What changed: when to take this   oxyCODONE 5 MG immediate release tablet Commonly known as: Oxy IR/ROXICODONE Take 1 tablet (5 mg total) by mouth every 6 (six) hours as needed for severe pain.   potassium chloride SA 20 MEQ tablet Commonly known as: K-DUR Take 1 tablet (20 mEq total) by mouth daily. What changed:   how much to take  additional instructions   PROBIOTIC PO Take 1 capsule by mouth daily.   QUEtiapine 50 MG tablet Commonly known as: SEROQUEL Take 1 tablet (50 mg total) by mouth at bedtime. What changed: how much to take     sertraline 100 MG tablet Commonly known as: ZOLOFT Take 1 tablet (100 mg total) by mouth daily.   spironolactone 25 MG tablet Commonly known as: ALDACTONE Take 1 tablet (25 mg total) by mouth daily.   torsemide 20 MG tablet Commonly known as: DEMADEX Take 2 tablets (40 mg total) by mouth daily.   traZODone 50 MG tablet Commonly known as: DESYREL Take 0.5 tablets (25 mg total) by mouth at bedtime.   vitamin B-12 1000 MCG tablet Commonly known as: CYANOCOBALAMIN Take 1,000 mcg by mouth daily.   warfarin 5 MG tablet Commonly known as: COUMADIN Take 1 tablet (5 mg total) by mouth daily at 6 PM. Or  as directed            Durable Medical Equipment  (From admission, onward)         Start     Ordered   04/13/19 1403  For home use only DME Nebulizer/meds  Once    Question Answer Comment  Patient needs a nebulizer to treat with the following condition Sarcoidosis   Length of Need 12 Months      04/13/19 1403   04/13/19 1402  For home use only DME Walker rolling  Once    Question:  Patient needs a walker to treat with the following condition  Answer:  Physical deconditioning   04/13/19 1403   04/13/19 1402  For home use only DME oxygen  Once    Question Answer Comment  Length of Need 6 Months   Mode or (Route) Nasal cannula   Liters per Minute 1   Frequency Continuous (stationary and portable oxygen unit needed)   Oxygen delivery system Gas      04/13/19 1403         Follow-up Information    [facility] [place]. Go on 04/16/2019.   Specialty: Cardiothoracic Surgery Why: You have an appointment at Dr. Orvan Julywen's office on Monday 04/16/19 at 9:30 for suture removal.  Contact information: [address], [address] [place] [id number] [phone]       Laurey MoraleMcLean, Dalton S, MD. Go on 04/27/2019.   Specialty: Cardiology Why: You have a cardiology appointment with Dr. Shirlee LatchMcLean on Friday 04/27/19 at 2:00pm. Contact  information: [address] [place] [address] [phone]        [facility] [place]. Go on 04/30/2019.   Specialty: Cardiothoracic Surgery Why: You have an appointment at Dr. Orvan Julywen's office on Monday 04/30/19 at 1:00pm. Please arrive 30 minutes early for a chest xray to be performed at [facility] located on the first floor of the same building.  Contact information: [address], [address] [place] [id number] [phone]       [facility] [address] [facility] Follow up on 04/18/2019.   Specialty: Cardiology Why: Please arrive 15 minutes early for your 10:30am coumadin clinic appointment.  Contact information: [address], [address] [place] [id number] [phone]         The patient has been discharged on:   1.Beta Blocker:  Yes [x   ]                              No   [   ]                              If No, reason:  2.Ace Inhibitor/ARB: Yes [   ]                                     No  [ x   ]                                     If No, reason:Labile BP  3.Statin:   Yes [   ]  No  [  x ]                  If No, reason:No CAD  4.Marlowe KaysValentino Hue  [   x]                  No   [   ]                  If No, reason:    Signed: Elenore Rota 04/13/2019, 2:04 PM

## 2019-04-10 NOTE — Progress Notes (Addendum)
5 Days Post-Op Procedure(s) (LRB): TRANSESOPHAGEAL ECHOCARDIOGRAM (TEE) (N/A) Mitral Valve (Mv) Replacement using Carbomedics Optiform Valve size 27mm (N/A) Tricuspid Valve Repair using Edwards MC3 Tricuspid ring size 25mm (N/A) Subjective: Says she is making progress. No new concerns. Walked in hall.   Objective: Vital signs in last 24 hours: Temp:  [96.6 F (35.9 C)-98.7 F (37.1 C)] 97.5 F (36.4 C) (07/28 0358) Pulse Rate:  [65-87] 65 (07/28 0358) Cardiac Rhythm: Normal sinus rhythm (07/28 0533) Resp:  [15-28] 15 (07/28 0358) BP: (96-136)/(60-81) 107/60 (07/28 0358) SpO2:  [86 %-100 %] 98 % (07/28 0358) Weight:  [107.6 kg] 107.6 kg (07/28 0605)     Intake/Output from previous day: 07/27 0701 - 07/28 0700 In: 480 [P.O.:480] Out: 1290 [Urine:1200; Chest Tube:90] Intake/Output this shift: No intake/output data recorded.  General appearance: alert, cooperative and no distress Neurologic: intact Heart: regular rate and rhythm, monitor shows freq PVC's, and a few episodes of ventricular bigeminy.  Lungs: clear to auscultation bilaterally Abdomen: Soft, non-tender. Extremities: No obvious edema. Wound: Sternotomy incision is dry and well approximated.  Lab Results: Recent Labs    04/09/19 0245 04/10/19 0450  WBC 10.7* 8.9  HGB 8.3* 8.2*  HCT 26.0* 25.6*  PLT 232 268   BMET:  Recent Labs    04/09/19 0245 04/10/19 0450  NA 136 134*  K 3.3* 4.5  CL 102 102  CO2 26 26  GLUCOSE 73 134*  BUN 10 12  CREATININE 0.75 0.74  CALCIUM 8.3* 8.6*    PT/INR:  Recent Labs    04/10/19 0450  LABPROT 20.8*  INR 1.8*   ABG    Component Value Date/Time   PHART 7.363 04/07/2019 0348   HCO3 22.4 04/07/2019 0348   TCO2 24 04/07/2019 0348   ACIDBASEDEF 3.0 (H) 04/07/2019 0348   O2SAT 94.0 04/07/2019 0348   CBG (last 3)  Recent Labs    04/09/19 1600 04/10/19 0054 04/10/19 0610  GLUCAP 118* 138* 119*    Assessment/Plan: S/P Procedure(s) (LRB): TRANSESOPHAGEAL  ECHOCARDIOGRAM (TEE) (N/A) Mitral Valve (Mv) Replacement using Carbomedics Optiform Valve size 83mm (N/A) Tricuspid Valve Repair using Edwards MC3 Tricuspid ring size 63mm (N/A)  -POD5 re-do mechanical mitral valve replacement and tricuspid valve annuloplasty. Continues to make progressive recovery. No new issues. Tubes and wires out.  Wt still ~5kg above pre-op, continue Demadex. Continue anticoagulation with Coumadin. INR 1.8 on Coumadin 5mg /day.  -History of HTN- Control reasonable but still having frequent PVC's and bigeminy.  Will slowly up-titrate metoprolol to 12.5mg  po TID.   -Expected post-op respiratory insufficiency--Mild, should be able to wean off remaining supplemental O2 today.  Continue IS.   -Expected acute blood loss anemia on chronic iron deficiency anemia-mild and well tolerated. Continue FeSO4, folic acid, T-02. Monitor.   -History of major depression-anxiolytics and SSRI have been resumed. Appropriate and stable affect.   -Anticipate she will be ready for discharge in 1-2 days.    LOS: 5 days    Antony Odea, PA-C [phone] 04/10/2019   I have seen and examined the patient and agree with the assessment and plan as outlined.  Doing well.  Rexene Alberts, MD 04/10/2019 9:39 AM

## 2019-04-10 NOTE — Progress Notes (Signed)
CARDIAC REHAB PHASE I   PRE:  Rate/Rhythm: 75 bigeminy  BP:  Sitting: 121/62      SaO2: 95 RA  MODE:  Ambulation: 200 ft   POST:  Rate/Rhythm: 97 bigeminy  BP:  Sitting: 133/62    SaO2: 93 RA   Pt ambulated 217ft in hallway standby assist with rollator. Pt took several standing rest breaks due to feeling lightheaded. Coached through purse lipped breathing. Pt requires a lot of encouragement and support. Pt returned to recliner, call bell and phone within reach. Encouraged IS, and a third walk later this afternoon. Will continue to follow.  [phone] Rufina Falco, RN BSN 04/10/2019 11:43 AM

## 2019-04-11 ENCOUNTER — Inpatient Hospital Stay (HOSPITAL_COMMUNITY): Payer: Self-pay

## 2019-04-11 LAB — PATHOLOGIST SMEAR REVIEW

## 2019-04-11 LAB — BASIC METABOLIC PANEL
Anion gap: 9 (ref 5–15)
BUN: 26 mg/dL — ABNORMAL HIGH (ref 6–20)
CO2: 23 mmol/L (ref 22–32)
Calcium: 8.8 mg/dL — ABNORMAL LOW (ref 8.9–10.3)
Chloride: 102 mmol/L (ref 98–111)
Creatinine, Ser: 1 mg/dL (ref 0.44–1.00)
GFR calc Af Amer: >60 mL/min (ref >60)
GFR calc non Af Amer: >60 mL/min (ref >60)
Glucose, Bld: 132 mg/dL — ABNORMAL HIGH (ref 70–99)
Potassium: 4.6 mmol/L (ref 3.5–5.1)
Sodium: 134 mmol/L — ABNORMAL LOW (ref 135–145)

## 2019-04-11 LAB — CBC
HCT: 25.5 % — ABNORMAL LOW (ref 36.0–46.0)
Hemoglobin: 8.2 g/dL — ABNORMAL LOW (ref 12.0–15.0)
MCH: 26.9 pg (ref 26.0–34.0)
MCHC: 32.2 g/dL (ref 30.0–36.0)
MCV: 83.6 fL (ref 80.0–100.0)
Platelets: 345 10*3/uL (ref 150–400)
RBC: 3.05 MIL/uL — ABNORMAL LOW (ref 3.87–5.11)
RDW: 18.6 % — ABNORMAL HIGH (ref 11.5–15.5)
WBC: 9.8 10*3/uL (ref 4.0–10.5)
nRBC: 6.7 % — ABNORMAL HIGH (ref 0.0–0.2)

## 2019-04-11 LAB — PROTIME-INR
INR: 2 — ABNORMAL HIGH (ref 0.8–1.2)
Prothrombin Time: 22.4 seconds — ABNORMAL HIGH (ref 11.4–15.2)

## 2019-04-11 LAB — GLUCOSE, CAPILLARY: Glucose-Capillary: 111 mg/dL — ABNORMAL HIGH (ref 70–99)

## 2019-04-11 MED ORDER — FUROSEMIDE 10 MG/ML IJ SOLN
80.0000 mg | Freq: Two times a day (BID) | INTRAMUSCULAR | Status: AC
  Administered 2019-04-11 (×2): 80 mg via INTRAVENOUS
  Filled 2019-04-11 (×3): qty 8

## 2019-04-11 MED ORDER — LACTULOSE 10 GM/15ML PO SOLN
20.0000 g | Freq: Every day | ORAL | Status: DC | PRN
  Filled 2019-04-11: qty 30

## 2019-04-11 MED ORDER — METOLAZONE 5 MG PO TABS
5.0000 mg | ORAL_TABLET | Freq: Once | ORAL | Status: AC
  Administered 2019-04-11: 10:00:00 5 mg via ORAL
  Filled 2019-04-11: qty 1

## 2019-04-11 NOTE — Progress Notes (Signed)
CARDIAC REHAB PHASE I   Offered to ambulate with pt. Pt declining stating she is trying to have a BM and is in too much pain. RN made aware. Will f/u tomorrow. Encouraged pt to ambulate later this evening.  Rufina Falco, RN BSN 04/11/2019 3:05 PM

## 2019-04-11 NOTE — Progress Notes (Addendum)
6 Days Post-Op Procedure(s) (LRB): TRANSESOPHAGEAL ECHOCARDIOGRAM (TEE) (N/A) Mitral Valve (Mv) Replacement using Carbomedics Optiform Valve size 54mm (N/A) Tricuspid Valve Repair using Edwards MC3 Tricuspid ring size 6mm (N/A) Subjective: Sitting up on the side of her bed, in good spirits. No new concerns. Feels she is continuing to make progress.   Objective: Vital signs in last 24 hours: Temp:  [98.2 F (36.8 C)-98.3 F (36.8 C)] 98.3 F (36.8 C) (07/29 0734) Pulse Rate:  [40-86] 74 (07/29 0734) Cardiac Rhythm: Normal sinus rhythm (07/29 0734) Resp:  [17-30] 20 (07/29 0734) BP: (106-136)/(59-70) 117/59 (07/29 0734) SpO2:  [92 %-100 %] 94 % (07/29 0734)      Intake/Output from previous day: 07/28 0701 - 07/29 0700 In: 0  Out: 500 [Urine:500] Intake/Output this shift: No intake/output data recorded.  General appearance: alert, cooperative and no distress Neurologic: intact Heart: regular rate and rhythm, monitor shows freq PVC's,  Several episodes of ventricular bigeminy, and a brief period of accelerated JR last evening.   Lungs: breath sounds are clear Abdomen: Soft, non-tender. Extremities: trace LE edema. Wound: Sternotomy incision is dry and well approximated.  Lab Results: Recent Labs    04/10/19 0450 04/11/19 0408  WBC 8.9 9.8  HGB 8.2* 8.2*  HCT 25.6* 25.5*  PLT 268 345   BMET:  Recent Labs    04/10/19 0450 04/11/19 0408  NA 134* 134*  K 4.5 4.6  CL 102 102  CO2 26 23  GLUCOSE 134* 132*  BUN 12 26*  CREATININE 0.74 1.00  CALCIUM 8.6* 8.8*    PT/INR:  Recent Labs    04/11/19 0408  LABPROT 22.4*  INR 2.0*   ABG    Component Value Date/Time   PHART 7.363 04/07/2019 0348   HCO3 22.4 04/07/2019 0348   TCO2 24 04/07/2019 0348   ACIDBASEDEF 3.0 (H) 04/07/2019 0348   O2SAT 94.0 04/07/2019 0348   CBG (last 3)  Recent Labs    04/10/19 2102 04/10/19 2132 04/11/19 0702  GLUCAP 129* 137* 111*    Assessment/Plan: S/P Procedure(s)  (LRB): TRANSESOPHAGEAL ECHOCARDIOGRAM (TEE) (N/A) Mitral Valve (Mv) Replacement using Carbomedics Optiform Valve size 19mm (N/A) Tricuspid Valve Repair using Edwards MC3 Tricuspid ring size 72mm (N/A)  -POD6 re-do mechanical mitral valve replacement and tricuspid valve annuloplasty. Continues to make progressive recovery. No new issues. Tubes and wires out. Minimal response to diuretic yesterday. Ordered Lasix 80mg  IV BID today with Zaroxolyn 5mg  po x 1 dose. Continue anticoagulation with Coumadin. INR 2.0 on Coumadin 5mg /day.  -History of HTN- Control reasonable but still having frequent PVC's and bigeminy.  Continue  metoprolol 12.5mg  po TID.   -Expected post-op respiratory insufficiency--improving, CXR still showing some diffuse density c/w mild pulmonary edema, no significant pleural effusions.   -Expected acute blood loss anemia on chronic iron deficiency anemia-mild and well tolerated. Continue FeSO4, folic acid, Z-02. Monitor.   -History of major depression-anxiolytics and SSRI have been resumed. Appropriate and stable affect.   -History of sarcoidosis based on physical findings- path on enlarged mediastinal lymph nodes all negative for granuloma or malignancy.  -Anticipate she will be ready for discharge in AM.     LOS: 6 days    Antony Odea, PA-C [phone] 04/11/2019   I have seen and examined the patient and agree with the assessment and plan as outlined.  Still needs diuresis.  Will keep 1 more day for IV diuretics.  Anticipate likely d/c home tomorrow  Rexene Alberts, MD 04/11/2019 1:49 PM

## 2019-04-12 LAB — BASIC METABOLIC PANEL
Anion gap: 11 (ref 5–15)
BUN: 21 mg/dL — ABNORMAL HIGH (ref 6–20)
CO2: 30 mmol/L (ref 22–32)
Calcium: 8.7 mg/dL — ABNORMAL LOW (ref 8.9–10.3)
Chloride: 94 mmol/L — ABNORMAL LOW (ref 98–111)
Creatinine, Ser: 0.93 mg/dL (ref 0.44–1.00)
GFR calc Af Amer: >60 mL/min (ref >60)
GFR calc non Af Amer: >60 mL/min (ref >60)
Glucose, Bld: 116 mg/dL — ABNORMAL HIGH (ref 70–99)
Potassium: 2.8 mmol/L — ABNORMAL LOW (ref 3.5–5.1)
Sodium: 135 mmol/L (ref 135–145)

## 2019-04-12 LAB — PROTIME-INR
INR: 2.2 — ABNORMAL HIGH (ref 0.8–1.2)
Prothrombin Time: 23.9 seconds — ABNORMAL HIGH (ref 11.4–15.2)

## 2019-04-12 LAB — POTASSIUM: Potassium: 3.2 mmol/L — ABNORMAL LOW (ref 3.5–5.1)

## 2019-04-12 MED ORDER — POTASSIUM CHLORIDE CRYS ER 20 MEQ PO TBCR
40.0000 meq | EXTENDED_RELEASE_TABLET | Freq: Three times a day (TID) | ORAL | Status: AC
  Administered 2019-04-12 – 2019-04-13 (×5): 40 meq via ORAL
  Filled 2019-04-12 (×5): qty 2

## 2019-04-12 MED ORDER — SPIRONOLACTONE 25 MG PO TABS
25.0000 mg | ORAL_TABLET | Freq: Every day | ORAL | Status: DC
  Administered 2019-04-12 – 2019-04-13 (×2): 25 mg via ORAL
  Filled 2019-04-12 (×2): qty 1

## 2019-04-12 MED ORDER — FUROSEMIDE 10 MG/ML IJ SOLN
80.0000 mg | Freq: Three times a day (TID) | INTRAMUSCULAR | Status: AC
  Administered 2019-04-12 (×3): 80 mg via INTRAVENOUS
  Filled 2019-04-12 (×3): qty 8

## 2019-04-12 NOTE — Progress Notes (Addendum)
7 Days Post-Op Procedure(s) (LRB): TRANSESOPHAGEAL ECHOCARDIOGRAM (TEE) (N/A) Mitral Valve (Mv) Replacement using Carbomedics Optiform Valve size 57mm (N/A) Tricuspid Valve Repair using Edwards MC3 Tricuspid ring size 62mm (N/A) Subjective: No new problems.  Now on RA with acceptable O2 sats.  BM yesterday.   Objective: Vital signs in last 24 hours: Temp:  [98.3 F (36.8 C)-98.6 F (37 C)] 98.6 F (37 C) (07/30 0625) Pulse Rate:  [67-76] 67 (07/30 0625) Cardiac Rhythm: Heart block (07/30 0700) Resp:  [18-25] 25 (07/30 0625) BP: (102-125)/(52-82) 102/52 (07/30 0625) SpO2:  [92 %-99 %] 99 % (07/30 0625) Weight:  [105.2 kg] 105.2 kg (07/30 0625)  Hemodynamic parameters for last 24 hours:    Intake/Output from previous day: 07/29 0701 - 07/30 0700 In: 120 [P.O.:120] Out: 5101 [Urine:5100; Stool:1] Intake/Output this shift: No intake/output data recorded.  General appearance:alert, cooperative and no distress Neurologic:intact Heart:regular rate and rhythm . Monitor shows SR with 1st degree AVB.  Lungs:breath sounds are clear Abdomen:Soft, non-tender. Extremities: trace LE edema. Wound:Sternotomy incision is dry and well approximated.  Lab Results: Recent Labs    04/10/19 0450 04/11/19 0408  WBC 8.9 9.8  HGB 8.2* 8.2*  HCT 25.6* 25.5*  PLT 268 345   BMET:  Recent Labs    04/11/19 0408 04/12/19 0319  NA 134* 135  K 4.6 2.8*  CL 102 94*  CO2 23 30  GLUCOSE 132* 116*  BUN 26* 21*  CREATININE 1.00 0.93  CALCIUM 8.8* 8.7*    PT/INR:  Recent Labs    04/12/19 0319  LABPROT 23.9*  INR 2.2*   ABG    Component Value Date/Time   PHART 7.363 04/07/2019 0348   HCO3 22.4 04/07/2019 0348   TCO2 24 04/07/2019 0348   ACIDBASEDEF 3.0 (H) 04/07/2019 0348   O2SAT 94.0 04/07/2019 0348   CBG (last 3)  Recent Labs    04/10/19 2102 04/10/19 2132 04/11/19 0702  GLUCAP 129* 137* 111*    Assessment/Plan: S/P Procedure(s) (LRB): TRANSESOPHAGEAL  ECHOCARDIOGRAM (TEE) (N/A) Mitral Valve (Mv) Replacement using Carbomedics Optiform Valve size 93mm (N/A) Tricuspid Valve Repair using Edwards MC3 Tricuspid ring size 47mm (N/A)  -POD67 re-do mechanical mitral valve replacement and tricuspid valve annuloplasty. Continues to make progressive recovery. No new issues.Good response to diuretic yesterday.  Plan to continue diuresis another day.  Continue anticoagulation with Coumadin. INR 2.2 on Coumadin 5mg /day.  -History of HTN- Control reasonable. Continue  metoprolol 12.5mg  po TID.   -Expected post-op respiratory insufficiency--improving, CXR on 7/29 showing some diffuse density c/w mild pulmonary edema, no significant pleural effusions.   -Expected acute blood loss anemia on chronic iron deficiency anemia-mild and well tolerated. Continue FeSO4, folic acid, J-85. Monitor.   -History of major depression-anxiolytics and SSRI have been resumed. Appropriate and stable affect.   -History of sarcoidosis based on physical findings- path on enlarged mediastinal lymph nodes all negative for granuloma or malignancy.  -Post-op volume excess- good response to diuresis yesterday with wt trending down but still above pre-op. Continue diuresis another day with Lasix 80mg  IV BID. Add spironolactone.   -Hypokalemia-supplement, check Mg++   LOS: 7 days    Antony Odea, PA-C [phone] 04/12/2019  I have seen and examined the patient and agree with the assessment and plan as outlined.  Tentatively plan d/c home tomorrow.  Rexene Alberts, MD 04/12/2019 8:41 AM

## 2019-04-12 NOTE — Progress Notes (Signed)
Pt has ambulated twice this afternoon, independently using RW.

## 2019-04-12 NOTE — Progress Notes (Signed)
SATURATION QUALIFICATIONS: (This note is used to comply with regulatory documentation for home oxygen)  Patient Saturations on Room Air at Rest = 90%  Patient Saturations on Room Air while Ambulating = 85%  Patient Saturations on 2 Liters of oxygen while Ambulating = 93%  Please briefly explain why patient needs home oxygen: yes pt needs oxygen. Not able to maintain sats during activity.  Rufina Falco, RN BSN 04/12/2019 11:09 AM

## 2019-04-12 NOTE — TOC Initial Note (Signed)
Transition of Care [facility]) - Initial/Assessment Note    Patient Details  Name: Theresa Crawford MRN: [id number] Date of Birth: 05-11-1981  Transition [facility]) CM/SW Contact:    Carles Collet, RN Phone Number: 04/12/2019, 11:44 AM  Clinical Narrative:            Patient from home w mother. Patient with several admissions in past few months.   Spoke w Daphne at CHF clinic for consideration to para medicine program.   CM following patient for potential O2 needs. Qualifying sat note from 7/30 but will continue diuresing for another 24 hours w IV Lasix.   Patient requesting RW.  Watch for possible nebulizer need as well.   Patient states she has transportation home.   She uses [facility] because they deliver and its convenient but she states that are more expensive since they do not accept the Good Rx card. We discussed that a lot of medications are on the $4 list at [facility] [place] and she could get 3 month prescriptions on them She was appreciative of the info and said she would consider [facility] as an option for medication.   PCP- [place]       Expected Discharge Plan: Home/Self Care Barriers to Discharge: Continued Medical Work up   Patient Goals and CMS Choice Patient states their goals for this hospitalization and ongoing recovery are:: to return home      Expected Discharge Plan and Services Expected Discharge Plan: Home/Self Care                                              Prior Living Arrangements/Services   Lives with:: Minor Children, Self                   Activities of Daily Living Home Assistive Devices/Equipment: Eyeglasses, Hand-held shower hose ADL Screening (condition at time of admission) Patient's cognitive ability adequate to safely complete daily activities?: Yes Is the patient deaf or have difficulty hearing?: No Does the patient have difficulty seeing, even when wearing glasses/contacts?: No Does the patient  have difficulty concentrating, remembering, or making decisions?: Yes Patient able to express need for assistance with ADLs?: Yes Does the patient have difficulty dressing or bathing?: No Independently performs ADLs?: Yes (appropriate for developmental age) Does the patient have difficulty walking or climbing stairs?: No Weakness of Legs: None Weakness of Arms/Hands: None  Permission Sought/Granted                  Emotional Assessment              Admission diagnosis:  MV STENOSIS MR TR Patient Active Problem List   Diagnosis Date Noted  . S/P redo mitral valve replacement with mechanical valve + tricuspid valve repair 04/05/2019  . S/P tricuspid valve repair 04/05/2019  . Obesity (BMI 30-39.9) 03/29/2019  . Sarcoidosis   . Acute on chronic diastolic congestive heart failure ([place]) 03/24/2019  . Normocytic anemia   . Dysuria 03/19/2019  . Acute CHF (congestive heart failure) ([place]) 03/04/2019  . MDD (major depressive disorder), recurrent episode ([place]) 04/05/2018  . S/P tooth extraction 04/03/2018  . Suicidal ideation 04/03/2018  . MDD (major depressive disorder)   . Chronic diastolic congestive heart failure ([place])   . Tricuspid regurgitation   . Hypomagnesemia 03/07/2018  . Tobacco abuse   .  Dyspnea 01/11/2018  . Prolonged QT interval 09/10/2017  . Normochromic normocytic anemia   . Pulmonary edema 07/05/2017  . Hypokalemia 06/12/2017  . Pulmonary hypertension (HCC) 06/12/2017  . Flash pulmonary edema (HCC) 06/11/2017  . Depression with anxiety 06/11/2017  . Recurrent mitral valve stenosis and regurgitation s/p mitral valve repair   . Acute pulmonary edema (HCC) 10/10/2016  . CAP (community acquired pneumonia) 05/25/2016  . Leukocytosis 05/24/2016  . Acute respiratory failure with hypoxia (HCC) 05/23/2016  . Acute on chronic diastolic CHF (congestive heart failure) (HCC) 05/23/2016  . Cough with hemoptysis 05/23/2016  . Postpartum complication pericarditis  in 2009 with eventual needing MVR in 2015 05/23/2016  . Anemia, iron deficiency 05/23/2016  . Hypertension   . SOB (shortness of breath)   . Respiratory distress 02/16/2016  . Essential hypertension 02/16/2016  . S/P mitral valve repair 03/27/2013   PCP:  Grayce SessionsEdwards, Michelle P, NP Pharmacy:   [facility] [place] [place] - [address]  [place] [id number] Phone: [phone] Fax: [phone]  [facility] [place] [address] [place] [address] Phone: [phone] Fax: [phone]     Social Determinants of Health (SDOH) Interventions    Readmission Risk Interventions Readmission Risk Prevention Plan 03/28/2019 03/27/2019  PCP or Specialist Appt within 3-5 Days Complete -  HRI or Home Care Consult Complete -  Social Work Consult for Recovery Care Planning/Counseling Complete -  Palliative Care Screening Not Applicable -  HRI or Home Care Consult - Complete  Palliative Care Screening - Not Applicable  Skilled Nursing Facility - Not Applicable  Some recent data might be hidden

## 2019-04-12 NOTE — Progress Notes (Addendum)
CARDIAC REHAB PHASE I   PRE:  Rate/Rhythm: 77 SR  BP:  Sitting: 113/61      SaO2: 90 RA  MODE:  Ambulation: 550 ft   POST:  Rate/Rhythm: 97 bigeminy  BP:  Sitting: 135/63    SaO2: 85 RA --> 93 2L   Pt ambulated 574ft in hallway, took several short standing rest breaks. Pt c/o some SOB, O2 checked, pt noted to be 85 on RA. Pt placed on 2L [place], sats rose to 93. Pt able to ambulate more. When asked about support at home, pt became tearful stating her mom "is not really there for her". Pt does state he has a Theme park manager and a friend that are going to check in on her though. Support and encouragement provided to pt. Offered to complete d/c educate, pt declining at this time. Pt requesting walker for home use. Encouraged another 2 walks today and continue IS use. Will f/u tomorrow.  [phone] Rufina Falco, RN BSN 04/12/2019 11:02 AM

## 2019-04-13 LAB — BASIC METABOLIC PANEL
Anion gap: 12 (ref 5–15)
BUN: 17 mg/dL (ref 6–20)
CO2: 31 mmol/L (ref 22–32)
Calcium: 8.7 mg/dL — ABNORMAL LOW (ref 8.9–10.3)
Chloride: 91 mmol/L — ABNORMAL LOW (ref 98–111)
Creatinine, Ser: 0.93 mg/dL (ref 0.44–1.00)
GFR calc Af Amer: >60 mL/min (ref >60)
GFR calc non Af Amer: >60 mL/min (ref >60)
Glucose, Bld: 144 mg/dL — ABNORMAL HIGH (ref 70–99)
Potassium: 2.9 mmol/L — ABNORMAL LOW (ref 3.5–5.1)
Sodium: 134 mmol/L — ABNORMAL LOW (ref 135–145)

## 2019-04-13 LAB — PROTIME-INR
INR: 2.5 — ABNORMAL HIGH (ref 0.8–1.2)
Prothrombin Time: 26.9 seconds — ABNORMAL HIGH (ref 11.4–15.2)

## 2019-04-13 LAB — MAGNESIUM: Magnesium: 2 mg/dL (ref 1.7–2.4)

## 2019-04-13 MED ORDER — OXYCODONE HCL 5 MG PO TABS: 5.0000 mg | ORAL_TABLET | Freq: Four times a day (QID) | ORAL | 0 refills | Status: DC | PRN

## 2019-04-13 MED ORDER — SPIRONOLACTONE 25 MG PO TABS: 25.0000 mg | ORAL_TABLET | Freq: Every day | ORAL | 1 refills | Status: DC

## 2019-04-13 MED ORDER — WARFARIN SODIUM 5 MG PO TABS: 5.0000 mg | ORAL_TABLET | Freq: Every day | ORAL | 1 refills | Status: DC

## 2019-04-13 MED ORDER — METOPROLOL SUCCINATE ER 25 MG PO TB24: 25.0000 mg | ORAL_TABLET | Freq: Every day | ORAL | 3 refills | Status: DC

## 2019-04-13 MED ORDER — ASPIRIN 81 MG PO TBEC: 81.0000 mg | DELAYED_RELEASE_TABLET | Freq: Every day | ORAL | Status: DC

## 2019-04-13 MED ORDER — ACETAMINOPHEN 325 MG PO TABS: 650.0000 mg | ORAL_TABLET | Freq: Four times a day (QID) | ORAL | Status: AC | PRN

## 2019-04-13 MED ORDER — POTASSIUM CHLORIDE CRYS ER 20 MEQ PO TBCR: 40.0000 meq | EXTENDED_RELEASE_TABLET | Freq: Once | ORAL | Status: AC

## 2019-04-13 MED ORDER — POTASSIUM CHLORIDE CRYS ER 20 MEQ PO TBCR: 20.0000 meq | EXTENDED_RELEASE_TABLET | Freq: Every day | ORAL | 3 refills | Status: DC

## 2019-04-13 MED FILL — SPIRONOLACTONE 25 MG TABLET: 25 | 30 days supply | Qty: 30 | Fill #0

## 2019-04-13 MED FILL — oxyCODONE HCL 5 MG TABS: 5 | 7 days supply | Qty: 30 | Fill #0

## 2019-04-13 MED FILL — METOPROLOL SUCCINATE ER 25: 25 | 60 days supply | Qty: 60 | Fill #0

## 2019-04-13 MED FILL — WARFARIN SODIUM 5 MG TABLET: 5 | 30 days supply | Qty: 30 | Fill #0

## 2019-04-13 NOTE — Progress Notes (Signed)
SATURATION QUALIFICATIONS: (This note is used to comply with regulatory documentation for home oxygen)  Patient Saturations on Room Air at Rest = 85%  Patient Saturations on Room Air while Ambulating =   Patient Saturations on 1 Liters of oxygen while Ambulating = 94%  Please briefly explain why patient needs home oxygen:  Pt requires 1 L supplemental O2 to keep SPO2 > 90%.

## 2019-04-13 NOTE — TOC Transition Note (Signed)
Transition of Care [facility]) - CM/SW Discharge Note   Patient Details  Name: Theresa Crawford MRN: [id number] Date of Birth: 1981/07/08  Transition of Care [facility]) CM/SW Contact:  Bethena Roys, RN Phone Number: 04/13/2019, 2:18 PM   Clinical Narrative:  Pt in need of DME Oxygen, Nebulizer Machine, and RW- CM did speak with patient- she is without insurance at this time. Financial Counselor has spoken with patient. Patient agreeable to have CM call [employer] for DME Referral- [facility] since without insurance. CM made referral with [place]. Liaison to speak with patient regarding charity information. If patient qualifies- DME will be delivered to the room prior to transition home. Medications were delivered via [place]. No further needs from CM at this time.      Final next level of care: Home/Self Care Barriers to Discharge: Continued Medical Work up   Patient Goals and CMS Choice Patient states their goals for this hospitalization and ongoing recovery are:: to return home   Choice offered to / list presented to : Patient   Discharge Plan and Services In-house Referral: NA Discharge Planning Services: CM Consult Post Acute Care Choice: Durable Medical Equipment          DME Arranged: Nebulizer machine, Oxygen, Walker rolling DME Agency: [employer](Company to see if patient meets charity standards.) Date DME Agency Contacted: 04/13/19 Time DME Agency Contacted: [phone] Representative spoke with at DME Agency: [place] ([place]) Interventions     Readmission Risk Interventions Readmission Risk Prevention Plan 03/28/2019 03/27/2019  PCP or Specialist Appt within 3-5 Days Complete -  HRI or Home Care Consult Complete -  Social Work Consult for [place] Planning/Counseling Complete -  Palliative Care Screening Not Applicable -  [place] or [place] - Complete  [place] - Not Applicable  Some recent data might be hidden

## 2019-04-13 NOTE — Progress Notes (Addendum)
      [address]       [place] [address]             [phone]        8 Days Post-Op Procedure(s) (LRB): TRANSESOPHAGEAL ECHOCARDIOGRAM (TEE) (N/A) Mitral Valve (Mv) Replacement using Carbomedics Optiform Valve size 54mm (N/A) Tricuspid Valve Repair using Edwards MC3 Tricuspid ring size 61mm (N/A)  Subjective: Patient has pain under left breast this am.  Objective: Vital signs in last 24 hours: Temp:  [98.5 F (36.9 C)-98.8 F (37.1 C)] 98.5 F (36.9 C) (07/31 0422) Pulse Rate:  [67-72] 67 (07/31 0422) Cardiac Rhythm: Normal sinus rhythm (07/30 2055) Resp:  [18-20] 18 (07/31 0422) BP: (96-119)/(53-67) 96/53 (07/31 0422) SpO2:  [91 %-99 %] 99 % (07/31 0422) Weight:  [101.9 kg] 101.9 kg (07/31 0425)  Pre op weight 102.5 kg Current Weight  04/13/19 101.9 kg      Intake/Output from previous day: 07/30 0701 - 07/31 0700 In: 560 [P.O.:560] Out: 2650 [Urine:2650]   Physical Exam:  Cardiovascular: RRR, no murmur Pulmonary: Mostly clear bilaterally Abdomen: Soft, non tender, bowel sounds present. Extremities: Trace bilateral lower extremity edema. Wounds: Clean and dry.  No erythema or signs of infection. Lower edge of sternal wound with slight serous drainage (right skin edge exposed)  Lab Results: CBC: Recent Labs    04/11/19 0408  WBC 9.8  HGB 8.2*  HCT 25.5*  PLT 345   BMET:  Recent Labs    04/12/19 0319 04/12/19 1638 04/13/19 0316  NA 135  --  134*  K 2.8* 3.2* 2.9*  CL 94*  --  91*  CO2 30  --  31  GLUCOSE 116*  --  144*  BUN 21*  --  17  CREATININE 0.93  --  0.93  CALCIUM 8.7*  --  8.7*    PT/INR:  Lab Results  Component Value Date   INR 2.5 (H) 04/13/2019   INR 2.2 (H) 04/12/2019   INR 2.0 (H) 04/11/2019   ABG:  INR: Will add last result for INR, ABG once components are confirmed Will add last 4 CBG results once components are confirmed  Assessment/Plan:  1. CV - SR in the 60's and SBP in the high 90's to  low 100's. Had a run of vent bigeminy yesterday. On Lopressor 12.5 mg tid and Coumadin 5 mg . INR this am up to 2.5 so will need reduced dose at discharge. 2.  Pulmonary - On 1 liter of oxygen this am. Will have nurse check oxygen saturation on room air and with ambulation this am. Encourage incentive spirometer. 3. Volume Overload - Previously given Lasix. On Spironolactone 25 mg daily. She was on Demadex 40 mg daily prior to surgery so will discuss with Dr. Roxy Manns. 4.  Acute blood loss anemia - Last H and H stable 8.2 and 25.5 5. Hypokalemia-potassium 2.9 this am so will supplement (Mg is 2) 6. History of major depression-continue current medications 7. Possible discharge later today  Theresa M ZimmermanPA-C 04/13/2019,7:10 AM    I have seen and examined the patient and agree with the assessment and plan as outlined.  D/C home today   Rexene Alberts, MD 04/13/2019 2:02 PM    [phone]

## 2019-04-13 NOTE — Progress Notes (Signed)
CARDIAC REHAB PHASE I   D/c education completed with pt. Pt educated on importance of showers and monitoring incisions daily. Pt expresses some drainage from lower incision, instructed to monitor how many times she needs to change the dressing and encouraged pt wear a breast binder so there is less stress on the incision. Encouraged continued IS use, walks, and sternal precautions. Pt given in-the-tube sheet, along with heart healthy and low sodium diets. Reviewed restrictions, daily weights, and exercise guidelines. Pt agreeable to information. Pt stated some stress and anxiety related to discharge. Support and encouragement provided. Will refer to [facility] [place]. Pt agreeable to Virtual Cardiac Rehab.   Pt is interested in participating in Virtual Cardiac Rehab. Pt advised that Virtual Cardiac Rehab is provided at no cost to the patient.  Checklist:  1. Pt has smart device  ie smartphone and/or ipad for downloading an app  Yes 2. Reliable internet/wifi service    Yes 3. Understands how to use their smartphone and navigate within an app.  Yes  Reviewed with pt the scheduling process for virtual cardiac rehab.  Pt verbalized understanding.   [phone] Rufina Falco, RN BSN 04/13/2019 11:48 AM

## 2019-04-16 ENCOUNTER — Other Ambulatory Visit: Payer: Self-pay

## 2019-04-16 ENCOUNTER — Encounter (INDEPENDENT_AMBULATORY_CARE_PROVIDER_SITE_OTHER): Payer: Self-pay | Admitting: *Deleted

## 2019-04-16 ENCOUNTER — Other Ambulatory Visit: Payer: Self-pay | Admitting: *Deleted

## 2019-04-16 DIAGNOSIS — Z4802 Encounter for removal of sutures: Secondary | ICD-10-CM

## 2019-04-16 DIAGNOSIS — G8918 Other acute postprocedural pain: Secondary | ICD-10-CM

## 2019-04-16 MED ORDER — TRAMADOL HCL 50 MG PO TABS: 50.0000 mg | ORAL_TABLET | Freq: Four times a day (QID) | ORAL | 0 refills | Status: DC | PRN

## 2019-04-18 ENCOUNTER — Telehealth (HOSPITAL_COMMUNITY): Payer: Self-pay | Admitting: Licensed Clinical Social Worker

## 2019-04-18 ENCOUNTER — Ambulatory Visit: Payer: Self-pay | Admitting: Pharmacist

## 2019-04-18 NOTE — Telephone Encounter (Signed)
CSW received referral from Carole Binning, RN to refer patient to Paramedicine. CSW attempted to contact patient to discuss referral to [facility]. Message left. Raquel Sarna, [place], [place]

## 2019-04-25 ENCOUNTER — Other Ambulatory Visit: Payer: Self-pay | Admitting: *Deleted

## 2019-04-25 DIAGNOSIS — G8918 Other acute postprocedural pain: Secondary | ICD-10-CM

## 2019-04-25 MED ORDER — TRAMADOL HCL 50 MG PO TABS: 50.0000 mg | ORAL_TABLET | Freq: Four times a day (QID) | ORAL | 0 refills | Status: DC | PRN

## 2019-04-26 ENCOUNTER — Inpatient Hospital Stay (HOSPITAL_COMMUNITY): Payer: Medicaid Other

## 2019-04-27 ENCOUNTER — Other Ambulatory Visit: Payer: Self-pay | Admitting: Thoracic Surgery (Cardiothoracic Vascular Surgery)

## 2019-04-27 ENCOUNTER — Encounter (HOSPITAL_COMMUNITY): Payer: Self-pay | Admitting: Cardiology

## 2019-04-27 DIAGNOSIS — Z9889 Other specified postprocedural states: Secondary | ICD-10-CM

## 2019-04-30 ENCOUNTER — Ambulatory Visit (HOSPITAL_COMMUNITY)
Admission: RE | Admit: 2019-04-30 | Discharge: 2019-04-30 | Disposition: A | Discharge status: Home / Self Care | Payer: Self-pay | Source: Ambulatory Visit | Attending: Internal Medicine | Admitting: Internal Medicine

## 2019-04-30 ENCOUNTER — Ambulatory Visit (INDEPENDENT_AMBULATORY_CARE_PROVIDER_SITE_OTHER): Payer: Self-pay | Admitting: Pharmacist

## 2019-04-30 ENCOUNTER — Encounter (HOSPITAL_COMMUNITY): Payer: Self-pay

## 2019-04-30 ENCOUNTER — Other Ambulatory Visit: Payer: Self-pay

## 2019-04-30 VITALS — BP 122/82 | HR 80 | Wt 226.0 lb

## 2019-04-30 DIAGNOSIS — K219 Gastro-esophageal reflux disease without esophagitis: Secondary | ICD-10-CM | POA: Insufficient documentation

## 2019-04-30 DIAGNOSIS — R011 Cardiac murmur, unspecified: Secondary | ICD-10-CM | POA: Insufficient documentation

## 2019-04-30 DIAGNOSIS — I110 Hypertensive heart disease with heart failure: Secondary | ICD-10-CM | POA: Insufficient documentation

## 2019-04-30 DIAGNOSIS — F419 Anxiety disorder, unspecified: Secondary | ICD-10-CM | POA: Insufficient documentation

## 2019-04-30 DIAGNOSIS — I5033 Acute on chronic diastolic (congestive) heart failure: Secondary | ICD-10-CM

## 2019-04-30 DIAGNOSIS — I5032 Chronic diastolic (congestive) heart failure: Secondary | ICD-10-CM | POA: Insufficient documentation

## 2019-04-30 DIAGNOSIS — M5136 Other intervertebral disc degeneration, lumbar region: Secondary | ICD-10-CM | POA: Insufficient documentation

## 2019-04-30 DIAGNOSIS — Z8249 Family history of ischemic heart disease and other diseases of the circulatory system: Secondary | ICD-10-CM | POA: Insufficient documentation

## 2019-04-30 DIAGNOSIS — I2510 Atherosclerotic heart disease of native coronary artery without angina pectoris: Secondary | ICD-10-CM | POA: Insufficient documentation

## 2019-04-30 DIAGNOSIS — Z7982 Long term (current) use of aspirin: Secondary | ICD-10-CM | POA: Insufficient documentation

## 2019-04-30 DIAGNOSIS — R6889 Other general symptoms and signs: Secondary | ICD-10-CM | POA: Insufficient documentation

## 2019-04-30 DIAGNOSIS — Z87891 Personal history of nicotine dependence: Secondary | ICD-10-CM | POA: Insufficient documentation

## 2019-04-30 DIAGNOSIS — I491 Atrial premature depolarization: Secondary | ICD-10-CM | POA: Insufficient documentation

## 2019-04-30 DIAGNOSIS — Z7901 Long term (current) use of anticoagulants: Secondary | ICD-10-CM | POA: Insufficient documentation

## 2019-04-30 DIAGNOSIS — I052 Rheumatic mitral stenosis with insufficiency: Secondary | ICD-10-CM | POA: Insufficient documentation

## 2019-04-30 DIAGNOSIS — Z952 Presence of prosthetic heart valve: Secondary | ICD-10-CM | POA: Insufficient documentation

## 2019-04-30 DIAGNOSIS — F319 Bipolar disorder, unspecified: Secondary | ICD-10-CM | POA: Insufficient documentation

## 2019-04-30 DIAGNOSIS — R0602 Shortness of breath: Secondary | ICD-10-CM | POA: Insufficient documentation

## 2019-04-30 DIAGNOSIS — Z9889 Other specified postprocedural states: Secondary | ICD-10-CM

## 2019-04-30 DIAGNOSIS — Z954 Presence of other heart-valve replacement: Secondary | ICD-10-CM

## 2019-04-30 DIAGNOSIS — I050 Rheumatic mitral stenosis: Secondary | ICD-10-CM

## 2019-04-30 DIAGNOSIS — I272 Pulmonary hypertension, unspecified: Secondary | ICD-10-CM | POA: Insufficient documentation

## 2019-04-30 DIAGNOSIS — Z79899 Other long term (current) drug therapy: Secondary | ICD-10-CM | POA: Insufficient documentation

## 2019-04-30 DIAGNOSIS — Z560 Unemployment, unspecified: Secondary | ICD-10-CM | POA: Insufficient documentation

## 2019-04-30 LAB — PROTIME-INR
INR: 3.6 — ABNORMAL HIGH (ref 0.8–1.2)
Prothrombin Time: 35.1 seconds — ABNORMAL HIGH (ref 11.4–15.2)

## 2019-04-30 LAB — BASIC METABOLIC PANEL
Anion gap: 9 (ref 5–15)
BUN: 11 mg/dL (ref 6–20)
CO2: 25 mmol/L (ref 22–32)
Calcium: 8.9 mg/dL (ref 8.9–10.3)
Chloride: 101 mmol/L (ref 98–111)
Creatinine, Ser: 0.78 mg/dL (ref 0.44–1.00)
GFR calc Af Amer: >60 mL/min (ref >60)
GFR calc non Af Amer: >60 mL/min (ref >60)
Glucose, Bld: 85 mg/dL (ref 70–99)
Potassium: 4.5 mmol/L (ref 3.5–5.1)
Sodium: 135 mmol/L (ref 135–145)

## 2019-04-30 NOTE — Progress Notes (Signed)
PCP: Primary HF Cardiologist: Dr Shirlee Latch  CT Surgeon: Dr Cornelius Moras.   HPI: 38 y.o. with history of mitral valve disease, diastolic CHF with prominent RV dysfunction, suspected systemic sarcoidosis, and pulmonary hypertension . In 7/14, she had mitral valve repair for mitral regurgitation in [place].  Not what the cause of mitral regurgitation was thought to be.  Also in [place], she was told that she has sarcoidosis. She never had a biopsy.  CTA chest in 2/20 showed findings consistent with sarcoidosis. She has had chronic diastolic CHF with exacerbations over the last few years.  She has been noted to have mitral stenosis, and the need for redo MV operation was entertained in [place] before she moved to [facility] in 2019.  Her grandmother has sarcoidosis, father has CHF and had an LVAD placed.   She was admitted in 6/20 with progressive dyspnea.  Echo showed EF 40-45% with concern for severe mitral stenosis and RV dysfunction.  She had a TEE which showed moderate mitral stenosis and mild MR (I reviewed this today). She had right and left heart cath showing minimally elevated filling pressures with mild, primarily pulmonary venous hypertension.  PASP was, however, 88 mmHg by echo.  Cardiac MRI showed LV EF 54%, RV EF 35% (mildly dysfunctional), and no delayed enhancment.  She was seen by TCTS (Dr. Cornelius Moras) in the hospital to consider redo mitral valve surgery.  She was seen by pulmonary (Dr. Tonia Brooms) in the hospital.  Given her constellation of clinical findings, he did suspect that she has sarcoidosis.  He recommended avoiding systemic steroids if she were going to have a surgery soon, would start after post-op healing.  She was diuresed and discharged home.   Admitted 03/24/2019 with increased dyspnea requiring Bipap. Diuresed with IV lasix and later underwent RHC for optimization. RHC showed pulmonary venous hypertension. Per Dr Shirlee Latch mitral stenosis may be major cause of HF. She was discharged with plans for MVR.    She was admitted on 04/05/19 for redo MVR. Optimized prior to surgery and undwerwent mechanical MVR. Due to ongoing desaturations she was discharged on home oxygen. She was set up with INR follow up on 04/18/19. She was discharged on 04/12/19.   Today she returns for post HF follow up.Overall feeling ok. SOB with exertion. She is able to walk 15 minutes with breaks. SOB with ADLs. Denies PND/Orthopnea. Appetite ok. No fever or chills. Weight at home  pounds. Taking all medications. She has not had INR checked due to transportation issues. She has issues getting food because she doesn't have a way to get to the store. Says she gets some food stamps. Unemployed. Lives with her mother.   RHC Procedural Findings (03/27/19): Hemodynamics (mmHg) RA mean 8 RV 50/10 PA 47/22, mean 32 PCWP mean 17 Oxygen saturations: PA 69% AO 99% Cardiac Output (Fick) 6.69  Cardiac Index (Fick) 3.23 PVR 2.24 WU  Past Medical History: 1. Mitral valve repair: 7/14 mitral valve repair in [place], [place] for mitral regurgitation.  Now with  mitral stenosis.  - TEE (6/20): EF 45=-50%, mildly dilated RV with mildly decreased systolic function, D-shaped interventricular septum, s/p mitral valve repair with mean gradient 8 mmHg and MVA by PHT 1.85 cm^2.  This is consistent with moderate MS. Mild MR and mild-moderate TR.  2. Chronic primarily diastolic CHF: With prominent RV failure.  - Cardiac MRI (6/20) with EF 54%, septal flattening, mild RV enlargement with mildly decreased systolic function, RV EF 35%.  No myocardial late gadolinium enhancement.  -  LHC/RHC (6/20): Mild nonobstructive CAD.  Mean RA 6, PA 48/20 mean 30, mean PCWP 16, LVEDP 6, CI 3.06.  3. Suspected systemic sarcoidosis: She was given this diagnosis in [place] but never had tissue diagnosis. She has hilar and pericardial lymphadenopathy, bilateral lacrimal hyperplasia with dry eyes, suspected erythema nodosum in past.  - CTA chest (2/20) with findings consistent  with pulmonary sarcoidosis.  4. Pulmonary hypertension: Echo in 6/20 with PASP 88 mmHg but PA pressure 48/20 on 6/20 RHC.  5. PVCs 6. Depression: She has had a behavioral health hospitalization.      ROS: All systems negative except as listed in HPI, PMH and Problem List.  SH:  Social History   Socioeconomic History  . Marital status: Single    Spouse name: Not on file  . Number of children: 2  . Years of education: Not on file  . Highest education level: Not on file  Occupational History  . Not on file  Social Needs  . Financial resource strain: Not on file  . Food insecurity    Worry: Not on file    Inability: Not on file  . Transportation needs    Medical: Not on file    Non-medical: Not on file  Tobacco Use  . Smoking status: Former Smoker    Packs/day: 0.25    Years: 24.00    Pack years: 6.00    Types: Cigarettes    Start date: 11/03/1999    Quit date: 04/03/2019    Years since quitting: 0.0  . Smokeless tobacco: Never Used  Substance and Sexual Activity  . Alcohol use: Not Currently    Comment: 04/04/2018 "1-2 beers/month; if that"  . Drug use: Not Currently  . Sexual activity: Not Currently  Lifestyle  . Physical activity    Days per week: Not on file    Minutes per session: Not on file  . Stress: Not on file  Relationships  . Social Musicianconnections    Talks on phone: Not on file    Gets together: Not on file    Attends religious service: Not on file    Active member of club or organization: Not on file    Attends meetings of clubs or organizations: Not on file    Relationship status: Not on file  . Intimate partner violence    Fear of current or ex partner: Not on file    Emotionally abused: Not on file    Physically abused: Not on file    Forced sexual activity: Not on file  Other Topics Concern  . Not on file  Social History Narrative  . Not on file    FH:  Family History  Problem Relation Age of Onset  . Heart failure Father        just  received LVAD  . Heart disease Paternal Grandfather        stent placement    Past Medical History:  Diagnosis Date  . Anxiety   . Arthritis    "lower back" (04/04/2018)  . Bipolar disorder (HCC)   . Chronic bronchitis (HCC)   . Chronic diastolic CHF (congestive heart failure) (HCC)   . Chronic lower back pain   . DDD (degenerative disc disease), lumbar   . Depression   . Dyspnea   . GERD (gastroesophageal reflux disease)   . Headache    "weekly" (04/04/2018), miragrains in past (04/03/2019)  . Heart murmur   . Hypertension   . Mitral valve disease   .  Normocytic anemia   . Obesity   . Palpitations   . Pericarditis   . Pneumonia   . Premature atrial contractions   . Pulmonary hypertension ([place])   . PVC's (premature ventricular contractions)    a. h/o palpitations with event monitor in 03/2017 showing NSR with PACs/PVCs.  . Recurrent mitral valve stenosis and regurgitation s/p mitral valve repair   . S/P mitral valve repair 03/27/2013   Dr. Fransico Michael - [place], [place] - complex valvuloplasty including resuspension of entire posterior leaflet using Gore-tex neochords and 30 mm Edwards Physio ring annuloplasty  . S/P redo mitral valve replacement with mechanical valve 04/05/2019   29 mm Sorin Carbomedics Optiform bileaflet mechanical valve  . S/P tricuspid valve repair 04/05/2019   28 mm Edwards mc3 ring annuloplasty  . Sarcoidosis   . Tobacco abuse   . Tricuspid regurgitation     Current Outpatient Medications  Medication Sig Dispense Refill  . acetaminophen (TYLENOL) 325 MG tablet Take 2 tablets (650 mg total) by mouth every 6 (six) hours as needed for mild pain.    Marland Kitchen albuterol (PROVENTIL HFA;VENTOLIN HFA) 108 (90 Base) MCG/ACT inhaler Inhale 2 puffs into the lungs every 6 (six) hours as needed for wheezing or shortness of breath.    Marland Kitchen aspirin EC 81 MG EC tablet Take 1 tablet (81 mg total) by mouth daily.    . budesonide (PULMICORT) 180 MCG/ACT inhaler Inhale 1 puff into  the lungs 2 (two) times a day. 1 each 3  . busPIRone (BUSPAR) 7.5 MG tablet Take 1 tablet (7.5 mg total) by mouth 3 (three) times daily. 90 tablet 1  . clonazePAM (KLONOPIN) 0.5 MG tablet Take 1 tablet (0.5 mg total) by mouth 2 (two) times daily. 60 tablet 0  . docusate sodium (COLACE) 100 MG capsule Take 100 mg by mouth daily as needed for moderate constipation.     Marland Kitchen EPINEPHrine (EPIPEN 2-PAK) 0.3 mg/0.3 mL IJ SOAJ injection Inject 0.3 Units into the muscle once.     . ferrous sulfate 325 (65 FE) MG tablet Take 1 tablet (325 mg total) by mouth 2 (two) times daily with a meal. (Patient taking differently: Take 325 mg by mouth daily with breakfast. )  3  . folic acid (FOLVITE) 782 MCG tablet Take 400 mcg by mouth daily.    . hydrOXYzine (VISTARIL) 25 MG capsule Take 1 capsule (25 mg total) by mouth 3 (three) times daily as needed. 30 capsule 3  . metoprolol succinate (TOPROL-XL) 25 MG 24 hr tablet Take 1 tablet (25 mg total) by mouth daily. 60 tablet 3  . oxyCODONE (OXY IR/ROXICODONE) 5 MG immediate release tablet Take 1 tablet (5 mg total) by mouth every 6 (six) hours as needed for severe pain. 30 tablet 0  . potassium chloride SA (K-DUR) 20 MEQ tablet Take 1 tablet (20 mEq total) by mouth daily. 60 tablet 3  . Probiotic Product (PROBIOTIC PO) Take 1 capsule by mouth daily.    . QUEtiapine (SEROQUEL) 50 MG tablet Take 1 tablet (50 mg total) by mouth at bedtime. (Patient taking differently: Take 100 mg by mouth at bedtime. ) 30 tablet 0  . sertraline (ZOLOFT) 100 MG tablet Take 1 tablet (100 mg total) by mouth daily. 30 tablet 3  . spironolactone (ALDACTONE) 25 MG tablet Take 1 tablet (25 mg total) by mouth daily. 30 tablet 1  . torsemide (DEMADEX) 20 MG tablet Take 2 tablets (40 mg total) by mouth daily. 30 tablet 3  .  traMADol (ULTRAM) 50 MG tablet Take 1 tablet (50 mg total) by mouth every 6 (six) hours as needed. 28 tablet 0  . traZODone (DESYREL) 50 MG tablet Take 0.5 tablets (25 mg total) by  mouth at bedtime. 30 tablet 3  . vitamin B-12 (CYANOCOBALAMIN) 1000 MCG tablet Take 1,000 mcg by mouth daily.    Marland Kitchen. warfarin (COUMADIN) 5 MG tablet Take 1 tablet (5 mg total) by mouth daily at 6 PM. Or as directed 30 tablet 1   No current facility-administered medications for this encounter.     Vitals:   04/30/19 1415  BP: 122/82  Pulse: 80  SpO2: 99%  Weight: 102.5 kg (226 lb)   Wt Readings from Last 3 Encounters:  04/30/19 102.5 kg (226 lb)  04/13/19 101.9 kg (224 lb 11.2 oz)  04/03/19 102.5 kg (226 lb)    PHYSICAL EXAM: General:  Tearful.  No resp difficulty HEENT: normal Neck: supple. JVP flat. Carotids 2+ bilaterally; no bruits. No lymphadenopathy or thryomegaly appreciated. Cor: PMI normal. Regular rate & rhythm. No rubs, gallops. Mechanical click. Sternal scar  Lungs: clear Abdomen: soft, nontender, nondistended. No hepatosplenomegaly. No bruits or masses. Good bowel sounds. 4 mediastinal sites. Medial sites dry scabs. Lateral sites moist. No induration. Covered with band aides  Extremities: no cyanosis, clubbing, rash, edema Neuro: alert & orientedx3, cranial nerves grossly intact. Moves all 4 extremities w/o difficulty. Affect pleasant.  ASSESSMENT & PLAN: 1. Mitral valve disease: Patient [place] mitral valve [place]. Underlying lesion uncertain, but suspect rheumatic heart disease versus possibly sarcoidosis itself.  TEE from 6/20. She has mild, eccentric MR. The mitral valve has been repaired, mean gradient 8 mmHg with MVA 1.85 cm^2 by PHT. Visually, the valve appears significantly stenosed. Overall, the stenosis appears moderate. She has not had documented atrial fibrillation.  RHC on 7/14 showed pulmonary venous hypertension and mildly elevate PCWP, so she does not appear to have a significant component of PAH from sarcoidosis causing RV failure.    S/P Redo Mechanical MVR.  - She has not had INR checked since discharge. I am going to  check INR today and set up follow up [place] [facility].  - She understands she will need antibiotic prophylaxis prior to dental procedure.  (2 grams of amoxicillin prior to dental procedures) .  -2. Cronic diastolic CHF with prominent RV failure on imaging: TEE in 6/20 with EF 45-50%, mildly dilated/dysfunctional RV with D-shaped septum, moderate mitral stenosis. The cardiac MRI showed LVEF 54%, RVEF 35%, and no myocardial or pericardial delayed enhancement (no evidence for cardiac sarcoidosis). RHC yesterday showed mild pulmonary venous hypertension and mildly elevated PCWP, no evidence for PAH from sarcoidosis, suspect CHF may be from mitral stenosis primarily.  - NYHA III. Volume status stable. Continue torsemide 40 mg daily with KCl 40 daily. - Check BMET today.   3. Pulmonary hypertension: PASP 88 mmHg on TTE, but RHC in 6/20 showed only mild pulmonary venous hypertension. Could have significant PAH in setting of sarcoidosis, would expect primarily pulmonary venous hypertension with mitral stenosis unless there has been pulmonary vascular remodeling. RHC on 7/14 again showed pulmonary venous hypertension, suggestive of mitral stenosis as cause of CHF rather than PAH from sarcoidosis with RV failure.  - Continue oxygen for now. Hopefully we can wean this.  4. Sarcoidosis: Strongly suspected based on lung imaging, probable erythema nodosum, and lacrimal hyperplasia with dry eyes. Biopsy 04/05/19 wa negative for malignancy/granuloma.  She has many social determinant that can impact her care. She has lack of transportation/money for food/medicaitons. She does have food stamps but has had time getting to the store.  [place] [facility] [place] [place] [facility].   She is trying to get disability. HFSW following closely. She will need ongoing assistance with transportation. Ideally we could [facility] but she does not have insurance.   Follow up 8 weeks  with Dr Shirlee LatchMcLean.     NP-c  3:33 PM

## 2019-04-30 NOTE — Patient Instructions (Addendum)
Labs were drawn today. We will ONLY contact you if something is Abnormal. NO NEWS IS GOOD NEWS!!  Please follow up with the [facility] with Dr. Aundra Dubin in 2 months.

## 2019-04-30 NOTE — Progress Notes (Deleted)
[address]       [place] [id number]             [phone]      Theresa Crawford is a 38 y.o. female patient who is status post mitral valve replacement and tricuspid valve repair with Dr. Cornelius Moraswen on 04/05/2019.  For her redo mitral valve replacement a mechanical valve was utilized.  She is now on chronic Coumadin anticoagulation.  Most recent recorded INR was 2.5 on 04/13/2019.  She was scheduled for a Coumadin follow-up appointment at the time of discharge for 04/18/2019.   No diagnosis found. Past Medical History:  Diagnosis Date  . Anxiety   . Arthritis    "lower back" (04/04/2018)  . Bipolar disorder (HCC)   . Chronic bronchitis (HCC)   . Chronic diastolic CHF (congestive heart failure) (HCC)   . Chronic lower back pain   . DDD (degenerative disc disease), lumbar   . Depression   . Dyspnea   . GERD (gastroesophageal reflux disease)   . Headache    "weekly" (04/04/2018), miragrains in past (04/03/2019)  . Heart murmur   . Hypertension   . Mitral valve disease   . Normocytic anemia   . Obesity   . Palpitations   . Pericarditis   . Pneumonia   . Premature atrial contractions   . Pulmonary hypertension (HCC)   . PVC's (premature ventricular contractions)    a. h/o palpitations with event monitor in 03/2017 showing NSR with PACs/PVCs.  . Recurrent mitral valve stenosis and regurgitation s/p mitral valve repair   . S/P mitral valve repair 03/27/2013   [place]us [place] valvuloplasty including resuspension of entire posterior leaflet using Gore-tex neochords and 30 mm Edwards Physio ring annuloplasty  . S/P redo mitral valve replacement with mechanical valve 04/05/2019   29 mm Sorin Carbomedics Optiform bileaflet mechanical valve  . S/P tricuspid valve repair 04/05/2019   28 mm Edwards mc3 ring annuloplasty  . Sarcoidosis   . Tobacco abuse   . Tricuspid regurgitation    Procedure:   Mitral Valve Replacement  Redo median sternotomy Sorin Carbomedics Optiform bileaflet mechanical valve(size 29mm, catalog [id number], serial #[id number])   TricuspidValve Repair Edwards mc3 ring annuloplasty (size5428mm, catalog #[id number], serial [id number])   Biopsy of Anterior Mediastinal Lymph Node   Surgeon:Clarence H. Cornelius Moraswen, MD  Assistant:Myron Hedwig Mortonoddenberry, PA-C  Anesthesia:David Noreene LarssonJoslin, MD  Operative Findings:  Rheumatic heart disease  Moderate-severe mitral stenosis  Type IIIA mitral valve dysfunction with moderate mitral regurgitation  Moderate tricuspid regurgiation  Normal LV systolic function  Severe pulmonary hypertension  RV hypertrophy and chamber enlargement due to pressure overload  Diffuse chronic inflammation throughout both lungs and mediastinus  Severe lymphadenopathy   Allergies  Allergen Reactions  . Bee Venom Anaphylaxis  . Lisinopril Anaphylaxis, Shortness Of Breath, Swelling and Other (See Comments)    Throat swells  . Penicillins Shortness Of Breath, Swelling and Other (See Comments)    Has patient had a PCN reaction causing immediate rash, facial/tongue/throat swelling, SOB or lightheadedness with hypotension: Yes Has patient had a PCN reaction causing severe rash involving mucus membranes or skin necrosis: Yes Has patient had a PCN reaction that required hospitalization No Has patient had a PCN reaction occurring within the last 10 years: No If all of the above answers are "NO", then may proceed with Cephalosporin use.   Marland Kitchen. Perflutren Lipid Microsphere Shortness Of Breath and Other (See Comments)  Chest Pain/Tightness, also  . Shellfish Allergy Anaphylaxis  . Contrast Media [Iodinated Diagnostic Agents] Hives, Itching and Other (See Comments)    Itching and hives to throat, neck, face and arms.   No difficulty breathing.   This [facility] given at [facility] approximately 2015. Contrast Dye     There were no vitals taken for this visit.  Subjective Theresa Crawford reports today for her routine follow-up visit status post redo mitral valve replacement with mechanical valve and tricuspid valve repair.  Objective   Cor: Pulm: Abd: Wound: Ext:    Assessment & Plan     Theresa Crawford 04/30/2019

## 2019-05-01 ENCOUNTER — Telehealth (HOSPITAL_COMMUNITY): Payer: Self-pay

## 2019-05-01 ENCOUNTER — Telehealth (HOSPITAL_COMMUNITY): Payer: Self-pay | Admitting: Licensed Clinical Social Worker

## 2019-05-01 NOTE — Telephone Encounter (Signed)
CSW received referral to assist patient with transportation to upcoming appointment at [place]. CSW contacted patient and left message for return call. Raquel Sarna, [place], [place]

## 2019-05-01 NOTE — Telephone Encounter (Signed)
Had patient and Amy sign the consent for Paramedic Patient Referral on  04/30/2019. Faxed to [phone].

## 2019-05-01 NOTE — Telephone Encounter (Signed)
Late Entry: CSW spoke with patient last week about referral to Paramedicine Program. Patient resides in [place] so discussed referral to [employer]. Patient agreeable and paperwork was left for patient's signature at clinic visit on 04-30-19. CSW confirmed signed paperwork and referral made for services. CSW will follow up with patient later this week. Raquel Sarna, [place], [place]

## 2019-05-02 ENCOUNTER — Telehealth (HOSPITAL_COMMUNITY): Payer: Self-pay | Admitting: Licensed Clinical Social Worker

## 2019-05-02 NOTE — Telephone Encounter (Signed)
CSW contacted [facility] [employer] and made arrangements for ride to patient's appointment on Monday August 24 to [place] office. Patient informed she will be picked up at 12noon and verbalizes understanding. CSW continues to be available. Raquel Sarna, [place], [place]

## 2019-05-02 NOTE — Telephone Encounter (Signed)
CSW received return call from patient who confirms transportation need for next week. Patient states she is working with a Licensed conveyancer to assist with her Insurance underwriter. CSW shared that [facility] may also be able to assist with resources as well. Patient grateful for assistance and CSW will return call with pick up time after transport arranged. Theresa Crawford, [place], [place]

## 2019-05-04 ENCOUNTER — Telehealth (INDEPENDENT_AMBULATORY_CARE_PROVIDER_SITE_OTHER): Payer: Self-pay

## 2019-05-04 NOTE — Telephone Encounter (Signed)
Patient called to request medication refill for    clonazePAM (KLONOPIN) 0.5 MG tablet busPIRone (BUSPAR) 7.5 MG tablet    Per PCP at last ov she was going to send referral to psychiatry but no referral has been made.   Please advice [phone]  Thank you Whitney Post

## 2019-05-04 NOTE — Telephone Encounter (Signed)
FWD to PCP. Tempestt S Roberts, CMA  

## 2019-05-05 ENCOUNTER — Other Ambulatory Visit (INDEPENDENT_AMBULATORY_CARE_PROVIDER_SITE_OTHER): Payer: Self-pay | Admitting: Primary Care

## 2019-05-05 MED ORDER — BUSPIRONE HCL 7.5 MG PO TABS: 7.5000 mg | ORAL_TABLET | Freq: Three times a day (TID) | ORAL | 3 refills | Status: DC

## 2019-05-05 NOTE — Telephone Encounter (Signed)
Sent in [place] had clonazepam June no concern of with draw al at this time denied

## 2019-05-07 ENCOUNTER — Other Ambulatory Visit: Payer: Self-pay | Admitting: *Deleted

## 2019-05-07 ENCOUNTER — Telehealth (HOSPITAL_COMMUNITY): Payer: Self-pay | Admitting: Licensed Clinical Social Worker

## 2019-05-07 DIAGNOSIS — G8918 Other acute postprocedural pain: Secondary | ICD-10-CM

## 2019-05-07 MED ORDER — TRAMADOL HCL 50 MG PO TABS: 50.0000 mg | ORAL_TABLET | Freq: Four times a day (QID) | ORAL | 0 refills | Status: DC | PRN

## 2019-05-07 NOTE — Telephone Encounter (Signed)
Patient called to say that transport has not picked her up yet. CSW contacted [facility] [employer] to follow up with transport request. [facility] will reach out to patient directly to confirm pick up. Theresa Crawford, [place], [place]

## 2019-05-07 NOTE — Telephone Encounter (Signed)
Patient is aware. Theresa Crawford, CMA  

## 2019-05-07 NOTE — Telephone Encounter (Signed)
CSW confirmed rescheduled appointment for TCTS and contacted patient to inform that transportation will be arranged for Thursday May 10, 2019 @ 12:30pm. Message left. Raquel Sarna, [place], [place]

## 2019-05-08 ENCOUNTER — Telehealth (HOSPITAL_COMMUNITY): Payer: Self-pay

## 2019-05-08 NOTE — Telephone Encounter (Signed)
[place] a Paramedic Patient Referral on 05/01/2019.

## 2019-05-10 ENCOUNTER — Ambulatory Visit (INDEPENDENT_AMBULATORY_CARE_PROVIDER_SITE_OTHER): Payer: Self-pay | Admitting: Physician Assistant

## 2019-05-10 ENCOUNTER — Ambulatory Visit
Admission: RE | Admit: 2019-05-10 | Discharge: 2019-05-10 | Disposition: A | Discharge status: Home / Self Care | Payer: Medicaid Other | Source: Ambulatory Visit | Attending: Thoracic Surgery (Cardiothoracic Vascular Surgery) | Admitting: Thoracic Surgery (Cardiothoracic Vascular Surgery)

## 2019-05-10 ENCOUNTER — Other Ambulatory Visit: Payer: Self-pay

## 2019-05-10 ENCOUNTER — Telehealth (HOSPITAL_COMMUNITY): Payer: Self-pay | Admitting: Licensed Clinical Social Worker

## 2019-05-10 VITALS — BP 114/73 | HR 84 | Temp 97.7°F | Resp 16 | Ht 64.0 in | Wt 221.0 lb

## 2019-05-10 DIAGNOSIS — Z952 Presence of prosthetic heart valve: Secondary | ICD-10-CM

## 2019-05-10 DIAGNOSIS — Z9889 Other specified postprocedural states: Secondary | ICD-10-CM

## 2019-05-10 NOTE — Telephone Encounter (Signed)
CSW contacted patient and confirmed appointment for today at [place] and transport need. CSW arranged for taxi pick up time of 11:45am. Patient grateful for assistance will call if any concerns. Raquel Sarna, [place], [place]

## 2019-05-10 NOTE — Telephone Encounter (Signed)
Patient called for return taxi home after appointment today. Patient grateful for the assistance. Raquel Sarna, [place], [place]

## 2019-05-10 NOTE — Patient Instructions (Signed)
Okay to drive.  F/u in 2 months  You are encouraged to enroll and participate in the outpatient cardiac rehab program beginning as soon as practical.  Make every effort to stay physically active, get some type of exercise on a regular basis, and stick to a "heart healthy diet".  The long term benefits for regular exercise and a healthy diet are critically important to your overall health and wellbeing.  Endocarditis is a potentially serious infection of heart valves or inside lining of the heart.  It occurs more commonly in patients with diseased heart valves (such as patient's with aortic or mitral valve disease) and in patients who have undergone heart valve repair or replacement.  Certain surgical and dental procedures may put you at risk, such as dental cleaning, other dental procedures, or any surgery involving the respiratory, urinary, gastrointestinal tract, gallbladder or prostate gland.   To minimize your chances for develooping endocarditis, maintain good oral health and seek prompt medical attention for any infections involving the mouth, teeth, gums, skin or urinary tract.    Always notify your doctor or dentist about your underlying heart valve condition before having any invasive procedures. You will need to take antibiotics before certain procedures, including all routine dental cleanings or other dental procedures.  Your cardiologist or dentist should prescribe these antibiotics for you to be taken ahead of time.

## 2019-05-10 NOTE — Progress Notes (Addendum)
[address]       [place] [address]             [phone]      Theresa Crawford is a 38 y.o. female patient s/p  Redo sternotomy for a mitral valve replacement with a Carbomedics mechanical valve and tricuspid valve annuloplasty with and Edwards MC3 ring.  She was started on coumadin in the hospital and her post-op period was uneventful and she was discharged on 7/31. She was unable to make her last two appointments due to transportation issues. Transportation was arranged for her today. She recently saw Darrick Grinder, NP in the heart failure clinic. We had set her up with a coumadin appointment on discharge with the [place] office for 8/5 but the patient did not get her coumadin checked until her appointment with Amy on 8/17. Her INR was 3.6 at this time. She has another appointment next week to get her INR level drawn closer to where she lives [place]).   Patient is wearing an N95 mask today and is aware that she is in the high-risk population for COVID19 and is taking all precautionary measures.    1. S/P MVR (mitral valve replacement)   2. S/P TVR (tricuspid valve repair)   3. S/P biopsy    Past Medical History:  Diagnosis Date  . Anxiety   . Arthritis    "lower back" (04/04/2018)  . Bipolar disorder ([place])   . Chronic bronchitis ([place])   . Chronic diastolic CHF (congestive heart failure) ([place])   . Chronic lower back pain   . DDD (degenerative disc disease), lumbar   . Depression   . Dyspnea   . GERD (gastroesophageal reflux disease)   . Headache    "weekly" (04/04/2018), miragrains in past (04/03/2019)  . Heart murmur   . Hypertension   . Mitral valve disease   . Normocytic anemia   . Obesity   . Palpitations   . Pericarditis   . Pneumonia   . Premature atrial contractions   . Pulmonary hypertension ([place])   . PVC's (premature ventricular contractions)    a. h/o palpitations with event monitor in 03/2017 showing NSR with PACs/PVCs.  .  Recurrent mitral valve stenosis and regurgitation s/p mitral valve repair   . S/P mitral valve repair 03/27/2013   Dr. Fransico Michael - [place], [place] - complex valvuloplasty including resuspension of entire posterior leaflet using Gore-tex neochords and 30 mm Edwards Physio ring annuloplasty  . S/P redo mitral valve replacement with mechanical valve 04/05/2019   29 mm Sorin Carbomedics Optiform bileaflet mechanical valve  . S/P tricuspid valve repair 04/05/2019   28 mm Edwards mc3 ring annuloplasty  . Sarcoidosis   . Tobacco abuse   . Tricuspid regurgitation    No past surgical history pertinent negatives on file. Scheduled Meds: Current Outpatient Medications on File Prior to Visit  Medication Sig Dispense Refill  . acetaminophen (TYLENOL) 325 MG tablet Take 2 tablets (650 mg total) by mouth every 6 (six) hours as needed for mild pain.    Marland Kitchen albuterol (PROVENTIL HFA;VENTOLIN HFA) 108 (90 Base) MCG/ACT inhaler Inhale 2 puffs into the lungs every 6 (six) hours as needed for wheezing or shortness of breath.    Marland Kitchen aspirin EC 81 MG EC tablet Take 1 tablet (81 mg total) by mouth daily.    . budesonide (PULMICORT) 180 MCG/ACT inhaler Inhale 1 puff into the lungs 2 (two) times a day. 1 each  3  . busPIRone (BUSPAR) 7.5 MG tablet Take 1 tablet (7.5 mg total) by mouth 3 (three) times daily. 90 tablet 3  . clonazePAM (KLONOPIN) 0.5 MG tablet Take 1 tablet (0.5 mg total) by mouth 2 (two) times daily. 60 tablet 0  . docusate sodium (COLACE) 100 MG capsule Take 100 mg by mouth daily as needed for moderate constipation.     Marland Kitchen. EPINEPHrine (EPIPEN 2-PAK) 0.3 mg/0.3 mL IJ SOAJ injection Inject 0.3 Units into the muscle once.     . ferrous sulfate 325 (65 FE) MG tablet Take 1 tablet (325 mg total) by mouth 2 (two) times daily with a meal. (Patient taking differently: Take 325 mg by mouth daily with breakfast. )  3  . folic acid (FOLVITE) 800 MCG tablet Take 400 mcg by mouth daily.    . hydrOXYzine (VISTARIL) 25 MG  capsule Take 1 capsule (25 mg total) by mouth 3 (three) times daily as needed. 30 capsule 3  . metoprolol succinate (TOPROL-XL) 25 MG 24 hr tablet Take 1 tablet (25 mg total) by mouth daily. 60 tablet 3  . potassium chloride SA (K-DUR) 20 MEQ tablet Take 1 tablet (20 mEq total) by mouth daily. 60 tablet 3  . Probiotic Product (PROBIOTIC PO) Take 1 capsule by mouth daily.    . QUEtiapine (SEROQUEL) 50 MG tablet Take 1 tablet (50 mg total) by mouth at bedtime. (Patient taking differently: Take 100 mg by mouth at bedtime. ) 30 tablet 0  . sertraline (ZOLOFT) 100 MG tablet Take 1 tablet (100 mg total) by mouth daily. 30 tablet 3  . spironolactone (ALDACTONE) 25 MG tablet Take 1 tablet (25 mg total) by mouth daily. 30 tablet 1  . torsemide (DEMADEX) 20 MG tablet Take 2 tablets (40 mg total) by mouth daily. 30 tablet 3  . traMADol (ULTRAM) 50 MG tablet Take 1 tablet (50 mg total) by mouth every 6 (six) hours as needed. 28 tablet 0  . traZODone (DESYREL) 50 MG tablet Take 0.5 tablets (25 mg total) by mouth at bedtime. 30 tablet 3  . vitamin B-12 (CYANOCOBALAMIN) 1000 MCG tablet Take 1,000 mcg by mouth daily.    Marland Kitchen. warfarin (COUMADIN) 5 MG tablet Take 1 tablet (5 mg total) by mouth daily at 6 PM. Or as directed 30 tablet 1   No current facility-administered medications on file prior to visit.     Allergies  Allergen Reactions  . Bee Venom Anaphylaxis  . Lisinopril Anaphylaxis, Shortness Of Breath, Swelling and Other (See Comments)    Throat swells  . Penicillins Shortness Of Breath, Swelling and Other (See Comments)    Has patient had a PCN reaction causing immediate rash, facial/tongue/throat swelling, SOB or lightheadedness with hypotension: Yes Has patient had a PCN reaction causing severe rash involving mucus membranes or skin necrosis: Yes Has patient had a PCN reaction that required hospitalization No Has patient had a PCN reaction occurring within the last 10 years: No If all of the above  answers are "NO", then may proceed with Cephalosporin use.   Marland Kitchen. Perflutren Lipid Microsphere Shortness Of Breath and Other (See Comments)    Chest Pain/Tightness, also  . Shellfish Allergy Anaphylaxis  . Contrast Media [Iodinated Diagnostic Agents] Hives, Itching and Other (See Comments)    Itching and hives to throat, neck, face and arms.   No difficulty breathing.   [facility]. Contrast Dye   Active Problems:   * No active hospital  problems. *  Blood pressure 114/73, pulse 84, temperature 97.7 F (36.5 C), resp. rate 16, height 5\' 4"  (1.626 m), weight 221 lb (100.2 kg), SpO2 96 %.  Subjective   Overall she is healing well. She is trying to be as active as she can but still does get short of breath from time-to-time. She is on 1-2L of oxygen at home. She had one episode where she got stuck in the bed on her back and it was painful, but was able to slowly get up to use the bathroom. She prefers to sleep on her side supported by many pillows.   Objective   Cor: RRR, no murmur. Mechanical valve click noted.  Pulm: CTA bilaterally and in all fields Abd: soft, non-tender Ext: no edema Wound: sternotomy is well healed. Chest tube sutures are removed and incisions are healing well.   CLINICAL DATA:  Hx MVR 03/2019, sob, former smoker  EXAM: CHEST - 2 VIEW  COMPARISON:  Chest radiographs dated 04/11/2019 03/03/2019  FINDINGS: Stable cardiomediastinal contours status post median sternotomy and mitral valve replacement. Tricuspid annuloplasty noted. No pneumothorax or pleural effusion. Stable to slightly decreased interstitial airspace opacities likely representing pulmonary edema. Upper abdomen and visualized skeleton unremarkable.  IMPRESSION: Stable to slightly decreased pulmonary opacities most likely representing edema.   Electronically Signed   By: Emmaline Kluver M.D.   On: 05/10/2019 13:15  Assessment & Plan    Today, Ms. Vanfleet is doing well. She appears euvolemic and is in NSR. She has had occasional incision pain but it is getting better each day. She is being followed by heart failure and they are titrating her coumadin and diuretics. Her last INR was supratherapeutic at  3.6, therefore her coumadin dose was decreased. She is only taking tramadol at night therefore, she was cleared to drive if she feels comfortable. She is interested in cardiac rehab but would prefer one at [facility] which is closer to home. She would also be willing to come to [facility] if there was transportation provided. She overall feels good today and feels like she is making good forward progress. Taking antibiotics before dental procedures was discussed at her heart failure appointment.   CXR was reviewed with the patient and all questions were answered to the patient's satisfaction.   She will follow-up in 2 months with Dr. Cornelius Moras.  No medication changes were made today. Continue coumadin with consistent INR draws and titration. She understands she will be on coumadin for a lifetime for her mechanical valve.   Sharlene Dory 05/10/2019

## 2019-05-14 ENCOUNTER — Telehealth (INDEPENDENT_AMBULATORY_CARE_PROVIDER_SITE_OTHER): Payer: Self-pay

## 2019-05-14 ENCOUNTER — Other Ambulatory Visit (HOSPITAL_COMMUNITY): Payer: Self-pay | Admitting: Cardiology

## 2019-05-14 MED ORDER — POTASSIUM CHLORIDE CRYS ER 20 MEQ PO TBCR: 20.0000 meq | EXTENDED_RELEASE_TABLET | Freq: Every day | ORAL | 3 refills | Status: DC

## 2019-05-14 NOTE — Telephone Encounter (Signed)
Patient called to make a medication refill for  traZODone (DESYREL) 50 MG tablet  Patient called [place] [facility] to get medication switch to [facility] but stated they did not have an rx and would need a new rx to go to [place].  Patient uses  [place], [place] [address]  Please advice [phone]  Thank you Whitney Post

## 2019-05-15 ENCOUNTER — Ambulatory Visit (INDEPENDENT_AMBULATORY_CARE_PROVIDER_SITE_OTHER): Payer: Medicaid Other | Admitting: Primary Care

## 2019-05-15 ENCOUNTER — Other Ambulatory Visit (INDEPENDENT_AMBULATORY_CARE_PROVIDER_SITE_OTHER): Payer: Self-pay | Admitting: Primary Care

## 2019-05-15 DIAGNOSIS — F333 Major depressive disorder, recurrent, severe with psychotic symptoms: Secondary | ICD-10-CM

## 2019-05-15 MED ORDER — SERTRALINE HCL 100 MG PO TABS: 100.0000 mg | ORAL_TABLET | Freq: Every day | ORAL | 3 refills | Status: DC

## 2019-05-15 MED ORDER — TRAZODONE HCL 50 MG PO TABS: 25.0000 mg | ORAL_TABLET | Freq: Every day | ORAL | 3 refills | Status: DC

## 2019-05-15 NOTE — Telephone Encounter (Signed)
FWD TO PCP. Kandice Schmelter S Dinisha Cai, CMA  

## 2019-05-17 ENCOUNTER — Ambulatory Visit: Payer: Medicaid Other | Admitting: Cardiovascular Disease

## 2019-05-31 ENCOUNTER — Telehealth (INDEPENDENT_AMBULATORY_CARE_PROVIDER_SITE_OTHER): Payer: Self-pay

## 2019-05-31 NOTE — Telephone Encounter (Signed)
Patient called to make a medication refill for   traZODone (DESYREL) 50 MG tablet   Patient uses  [place], [place] [address] [place] [address] desk saw were PCP sent rx on 05-15-19 but was printed. Patient would like rx to be sent electronically.   Please advice [phone]

## 2019-05-31 NOTE — Telephone Encounter (Signed)
FWD to PCP. Marguerita Stapp S Posey Jasmin  

## 2019-06-01 ENCOUNTER — Other Ambulatory Visit (INDEPENDENT_AMBULATORY_CARE_PROVIDER_SITE_OTHER): Payer: Self-pay | Admitting: Primary Care

## 2019-06-01 MED ORDER — TRAZODONE HCL 50 MG PO TABS: 25.0000 mg | ORAL_TABLET | Freq: Every day | ORAL | 3 refills | Status: DC

## 2019-06-04 ENCOUNTER — Ambulatory Visit: Payer: Medicaid Other

## 2019-06-04 ENCOUNTER — Ambulatory Visit (INDEPENDENT_AMBULATORY_CARE_PROVIDER_SITE_OTHER): Payer: Self-pay | Admitting: *Deleted

## 2019-06-04 ENCOUNTER — Other Ambulatory Visit: Payer: Self-pay

## 2019-06-04 DIAGNOSIS — Z954 Presence of other heart-valve replacement: Secondary | ICD-10-CM

## 2019-06-04 DIAGNOSIS — Z5181 Encounter for therapeutic drug level monitoring: Secondary | ICD-10-CM

## 2019-06-04 LAB — POCT INR: INR: 1.7 — AB (ref 2.0–3.0)

## 2019-06-04 NOTE — Patient Instructions (Addendum)
A full discussion of the nature of anticoagulants has been carried out.  A benefit risk analysis has been presented to the patient, so that they understand the justification for choosing anticoagulation at this time. The need for frequent and regular monitoring, precise dosage adjustment and compliance is stressed.  Side effects of potential bleeding are discussed.  The patient should avoid any OTC items containing aspirin or ibuprofen, and should avoid great swings in general diet.  Avoid alcohol consumption.  Call if any signs of abnormal bleeding.  Description   Today tablets 1.5 tablets then start taking 1 tablet daily except 1.5 tablets on Tuesdays and Saturdays. Coumadin Clinic [phone]

## 2019-06-14 ENCOUNTER — Telehealth: Payer: Self-pay

## 2019-06-14 ENCOUNTER — Telehealth (HOSPITAL_COMMUNITY): Payer: Self-pay | Admitting: Licensed Clinical Social Worker

## 2019-06-14 ENCOUNTER — Other Ambulatory Visit (HOSPITAL_COMMUNITY): Payer: Self-pay

## 2019-06-14 ENCOUNTER — Telehealth (HOSPITAL_COMMUNITY): Payer: Self-pay | Admitting: *Deleted

## 2019-06-14 MED ORDER — WARFARIN SODIUM 5 MG PO TABS: ORAL_TABLET | ORAL | 0 refills | Status: DC

## 2019-06-14 MED ORDER — METOPROLOL SUCCINATE ER 25 MG PO TB24: 25.0000 mg | ORAL_TABLET | Freq: Every day | ORAL | 3 refills | Status: DC

## 2019-06-14 NOTE — Addendum Note (Signed)
Addended by: Harrington Challenger on: 06/14/2019 11:17 AM   Modules accepted: Orders

## 2019-06-14 NOTE — Telephone Encounter (Signed)
Patient called regarding medication refills and transport needs. Patient stated she was unsure where to call for med refills and is in need of warfarin and metoprolol. Patient also shared upcoming appointment in HF clinic on 10/19 and has no transportation from [place]. CSW followed up with clinic staff for refills and informed patient to call back to CSW just prior to her appointment for transport needs. Patient grateful for the assistance. Raquel Sarna, [place], [place]

## 2019-06-14 NOTE — Telephone Encounter (Signed)
Patient requests refill of coumadin.

## 2019-06-14 NOTE — Telephone Encounter (Signed)
Patient contacted the office requesting a refill of her warfarin.  Patient had Mitral valve surgery 03/2019 with Dr. Roxy Manns.  Advised patient to contact her Cardiologist, or whomever is managing her Coumadin.  She acknowledged receipt.

## 2019-06-18 ENCOUNTER — Ambulatory Visit (INDEPENDENT_AMBULATORY_CARE_PROVIDER_SITE_OTHER): Payer: Self-pay | Admitting: *Deleted

## 2019-06-18 ENCOUNTER — Other Ambulatory Visit: Payer: Self-pay

## 2019-06-18 DIAGNOSIS — Z954 Presence of other heart-valve replacement: Secondary | ICD-10-CM

## 2019-06-18 DIAGNOSIS — Z5181 Encounter for therapeutic drug level monitoring: Secondary | ICD-10-CM

## 2019-06-18 LAB — POCT INR: INR: 2.7 (ref 2.0–3.0)

## 2019-06-18 NOTE — Patient Instructions (Signed)
Continue warfarin 1 tablet daily except 1.5 tablets on Tuesdays and Saturdays. Recheck INR in 2 week in [facility]. Coumadin Clinic [phone]

## 2019-06-25 ENCOUNTER — Telehealth (HOSPITAL_COMMUNITY): Payer: Self-pay | Admitting: Licensed Clinical Social Worker

## 2019-06-25 ENCOUNTER — Other Ambulatory Visit (HOSPITAL_COMMUNITY): Payer: Self-pay | Admitting: *Deleted

## 2019-06-25 MED ORDER — TORSEMIDE 20 MG PO TABS: 40.0000 mg | ORAL_TABLET | Freq: Every day | ORAL | 3 refills | Status: DC

## 2019-06-25 MED ORDER — SPIRONOLACTONE 25 MG PO TABS: 25.0000 mg | ORAL_TABLET | Freq: Every day | ORAL | 3 refills | Status: DC

## 2019-06-25 NOTE — Telephone Encounter (Signed)
Patient called regarding medication refills. CSW assisted with needs and provided patient with number and option for refills going forward. Patient grateful for assistance. Raquel Sarna, [place], [place]

## 2019-07-02 ENCOUNTER — Ambulatory Visit (HOSPITAL_COMMUNITY)
Admission: RE | Admit: 2019-07-02 | Discharge: 2019-07-02 | Disposition: A | Discharge status: Home / Self Care | Payer: Self-pay | Source: Ambulatory Visit | Attending: Cardiology | Admitting: Cardiology

## 2019-07-02 ENCOUNTER — Telehealth: Payer: Self-pay | Admitting: Cardiology

## 2019-07-02 ENCOUNTER — Other Ambulatory Visit: Payer: Self-pay

## 2019-07-02 ENCOUNTER — Telehealth (HOSPITAL_COMMUNITY): Payer: Self-pay | Admitting: Licensed Clinical Social Worker

## 2019-07-02 DIAGNOSIS — R002 Palpitations: Secondary | ICD-10-CM

## 2019-07-02 DIAGNOSIS — I5032 Chronic diastolic (congestive) heart failure: Secondary | ICD-10-CM

## 2019-07-02 NOTE — Patient Instructions (Addendum)
Labs ordered for [facility] in [place].   We will only contact you if something comes back abnormal or we need to make some changes. Otherwise no news is good news!  Your provider has recommended that  you wear a Zio Patch for 3 days.  This device will be mailed to you.  This monitor will record your heart rhythm for our review.  IF you have any symptoms while wearing the monitor please press the button.  If you have any issues with the patch or you notice a red or orange light on it please call the company at [phone].  Once you remove the patch please mail it back to the company as soon as possible so we can get the results.  Please call [phone] with any billing questions.    Your physician has requested that you have an echocardiogram at [facility]. Echocardiography is a painless test that uses sound waves to create images of your heart. It provides your doctor with information about the size and shape of your heart and how well your heart's chambers and valves are working. This procedure takes approximately one hour. There are no restrictions for this procedure.  Your physician recommends that you schedule a follow-up appointment in: 6 weeks with Dr Aundra Dubin.   At the [facility], you and your health needs are our priority. As part of our continuing mission to provide you with exceptional heart care, we have created designated Provider Care Teams. These Care Teams include your primary Cardiologist (physician) and Advanced Practice Providers (APPs- Physician Assistants and Nurse Practitioners) who all work together to provide you with the care you need, when you need it.   You may see any of the following providers on your designated Care Team at your next follow up: Marland Kitchen Dr Glori Bickers . Dr Loralie Champagne . Darrick Grinder, NP . Lyda Jester, PA   Please be sure to bring in all your medications bottles to every appointment.

## 2019-07-02 NOTE — Progress Notes (Signed)
Heart Failure TeleHealth Note  Due to national recommendations of social distancing due to COVID 19, Audio/video telehealth visit is felt to be most appropriate for this patient at this time.  See MyChart message from today for patient consent regarding telehealth for [facility].  Date:  07/02/2019   ID:  KARLI WICKIZER, DOB [birth year], MRN [id number]  Location: Home  Provider location: [facility] Type of Visit: Established patient   PCP:  Grayce Sessions, NP  Cardiologist:  Prentice Docker, MD HF Cardiology: Dr. Shirlee Latch  Chief Complaint: Palpitations   History of Present Illness: BEKKA QIAN is a 37 y.o. female who presents via audio/video conferencing for a telehealth visit today.     she denies symptoms worrisome for COVID 19.   Patient has a history of mitral valve disease, diastolic CHF with prominent RV dysfunction, suspected systemic sarcoidosis, and pulmonary hypertension. In 7/14, she had mitral valve repair for mitral regurgitation in [place]. Not what the cause of mitral regurgitation was thought to be. Also in [place], she was told that she has sarcoidosis. She never had a biopsy. CTA chest in 2/20 showed findings consistent with sarcoidosis. She has had chronic diastolic CHF with exacerbations over the last few years. She has been noted to have mitral stenosis, and the need for redo MV operation was entertained in [place] before she moved to [facility] in 2019. Her grandmother has sarcoidosis, father has CHF and had an LVAD placed.   She was admitted in 6/20 with progressive dyspnea. Echo showed EF 40-45% with concern for severe mitral stenosis and RV dysfunction. She had a TEE which showed moderate mitral stenosis and mild MR (I reviewed this today). She had right and left heart cath showing minimally elevated filling pressures with mild, primarily pulmonary venous hypertension. PASP was, however, 88 mmHg by echo. Cardiac MRI showed  LV EF 54%, RV EF 35% (mildly dysfunctional), and no delayed enhancment. She was seen by TCTS (Dr. Cornelius Moras) in the hospital to consider redo mitral valve surgery. She was seen by pulmonary (Dr. Tonia Brooms) in the hospital. Given her constellation of clinical findings, he did suspect that she has sarcoidosis. He recommended avoiding systemic steroids if she were going to have a surgery soon, would start after post-op healing. She was diuresed and discharged home.   Admitted 03/24/2019 with increased dyspnea requiring Bipap. Diuresed with IV lasix and later underwent RHC for optimization. RHC showed pulmonary venous hypertension. Per Dr Shirlee Latch mitral stenosis may be major cause of HF. She was discharged with plans for MVR.   She was admitted on 04/05/19 for redo MVR. Optimized prior to surgery and undwerwent mechanical MVR + TV repair. Due to ongoing desaturations she was discharged on home oxygen.   Her breathing is improved since MVR.  Main complaint is daily palpitations.  No lightheadedness or syncope.  She is occasionally lightheaded with standing. She is wearing oxygen only at night now. She is short of breath after walking 1/2 block.  No chest pain.  No orthopnea/PND.   Labs (7/20): hgb 8.2 Labs (8/20): K 4.5, creatinine 0.78  Past Medical History: 1. Rheumatic mitral valve disease:  7/14 mitral valve repair in [place], [place] for mitral regurgitation. Now with mitral stenosis.  - TEE (6/20): EF 45-50%, mildly dilated RV with mildly decreased systolic function, D-shaped interventricular septum, s/p mitral valve repair with mean gradient 8 mmHg and MVA by PHT 1.85 cm^2. This is consistent with moderate MS. Mild MR and  mild-moderate TR. - Patient had mechanical MVR with TV repair in 7/20.   2. Chronic primarily diastolic CHF: With prominent RV failure.  - Cardiac MRI (6/20) with EF 54%, septal flattening, mild RV enlargement with mildly decreased systolic function, RV EF 35%. No myocardial late  gadolinium enhancement.  - LHC/RHC (6/20): Mild nonobstructive CAD. Mean RA 6, PA 48/20 mean 30, mean PCWP 16, LVEDP 6, CI 3.06.  - RHC (7/20): mean RA 8, PA 47/22 mean 32, mean PCWP 17, CI 3.23, PVR 2.24.  3. Suspected systemic sarcoidosis: She was given this diagnosis in [place] but never had tissue diagnosis. She has hilar and pericardial lymphadenopathy, bilateral lacrimal hyperplasia with dry eyes, suspected erythema nodosum in past.  - CTA chest (2/20) with findings consistent with pulmonary sarcoidosis.  - Mediastinal node biopsy at time of cardiac surgery in 7/20 was negative for sarcoid.  4. Pulmonary hypertension: Echo in 6/20 with PASP 88 mmHg but [place] pressure 48/20 on 6/20 RHC.  5. PVCs 6. Depression: She has had a behavioral health hospitalization.  Past Surgical History:  Procedure Laterality Date  . ABDOMINAL HERNIA REPAIR  ~ 2011  . CARDIAC CATHETERIZATION  2014  . CESAREAN SECTION  2004; 2009  . HERNIA REPAIR    . MITRAL VALVE REPAIR  03/27/2013   Dr. Darcus Austin - [place], [place]. - complex valvuloplasty including resuspension of posterior leaflet with 30 mm CE Physio ring annuloplasty  . MITRAL VALVE REPLACEMENT N/A 04/05/2019   Procedure: Mitral Valve (Mv) Replacement using Carbomedics Optiform Valve size 76mm;  Surgeon: Purcell Nails, MD;  Location: [facility] OR;  Service: Open Heart Surgery;  Laterality: N/A;  . MULTIPLE EXTRACTIONS WITH ALVEOLOPLASTY N/A 04/03/2018   Procedure: Extraction of tooth #30 with alveoloplasty and gross debridement of remaining teeth;  Surgeon: Charlynne Pander, DDS;  Location: [facility] OR;  Service: Oral Surgery;  Laterality: N/A;  . PARTIAL HYSTERECTOMY  2011   PID w/problem with fallopian tubes, had left fallopian and left ovary removed, pt still having periods  . RIGHT HEART CATH N/A 03/27/2019   Procedure: RIGHT HEART CATH;  Surgeon: Laurey Morale, MD;  Location: [facility] INVASIVE CV LAB;  Service: Cardiovascular;  Laterality: N/A;  . RIGHT/LEFT HEART  CATH AND CORONARY ANGIOGRAPHY N/A 03/05/2019   Procedure: RIGHT/LEFT HEART CATH AND CORONARY ANGIOGRAPHY;  Surgeon: Kathleene Hazel, MD;  Location: [facility] INVASIVE CV LAB;  Service: Cardiovascular;  Laterality: N/A;  . TEE WITHOUT CARDIOVERSION N/A 05/26/2016   Procedure: TRANSESOPHAGEAL ECHOCARDIOGRAM (TEE);  Surgeon: [name], MD;  Location: [facility] ENDO SUITE;  Service: Cardiovascular;  Laterality: N/A;  . TEE WITHOUT CARDIOVERSION N/A 01/25/2018   Procedure: TRANSESOPHAGEAL ECHOCARDIOGRAM (TEE) WITH PROPOFOL;  Surgeon: Jonelle Sidle, MD;  Location: [facility] ENDO SUITE;  Service: Cardiovascular;  Laterality: N/A;  . TEE WITHOUT CARDIOVERSION N/A 03/05/2019   Procedure: TRANSESOPHAGEAL ECHOCARDIOGRAM (TEE);  Surgeon: Pricilla Riffle, MD;  Location: [facility] ENDOSCOPY;  Service: Cardiovascular;  Laterality: N/A;  . TEE WITHOUT CARDIOVERSION N/A 04/05/2019   Procedure: TRANSESOPHAGEAL ECHOCARDIOGRAM (TEE);  Surgeon: Purcell Nails, MD;  Location: [facility] OR;  Service: Open Heart Surgery;  Laterality: N/A;  . TRICUSPID VALVE REPLACEMENT N/A 04/05/2019   Procedure: Tricuspid Valve Repair using Edwards MC3 Tricuspid ring size 22mm;  Surgeon: Purcell Nails, MD;  Location: [facility] OR;  Service: Open Heart Surgery;  Laterality: N/A;     Current Outpatient Medications  Medication Sig Dispense Refill  . acetaminophen (TYLENOL) 325 MG tablet Take 2 tablets (650 mg  total) by mouth every 6 (six) hours as needed for mild pain.    Marland Kitchen. albuterol (PROVENTIL HFA;VENTOLIN HFA) 108 (90 Base) MCG/ACT inhaler Inhale 2 puffs into the lungs every 6 (six) hours as needed for wheezing or shortness of breath.    Marland Kitchen. aspirin EC 81 MG EC tablet Take 1 tablet (81 mg total) by mouth daily.    . budesonide (PULMICORT) 180 MCG/ACT inhaler Inhale 1 puff into the lungs 2 (two) times a day. 1 each 3  . busPIRone (BUSPAR) 7.5 MG tablet Take 1 tablet (7.5 mg total) by mouth 3 (three) times daily. 90 tablet 3  . clonazePAM (KLONOPIN) 0.5 MG  tablet Take 1 tablet (0.5 mg total) by mouth 2 (two) times daily. 60 tablet 0  . docusate sodium (COLACE) 100 MG capsule Take 100 mg by mouth daily as needed for moderate constipation.     Marland Kitchen. EPINEPHrine (EPIPEN 2-PAK) 0.3 mg/0.3 mL IJ SOAJ injection Inject 0.3 Units into the muscle once.     . ferrous sulfate 325 (65 FE) MG tablet Take 1 tablet (325 mg total) by mouth 2 (two) times daily with a meal. (Patient taking differently: Take 325 mg by mouth daily with breakfast. )  3  . folic acid (FOLVITE) 800 MCG tablet Take 400 mcg by mouth daily.    . hydrOXYzine (VISTARIL) 25 MG capsule Take 1 capsule (25 mg total) by mouth 3 (three) times daily as needed. 30 capsule 3  . metoprolol succinate (TOPROL-XL) 25 MG 24 hr tablet Take 1 tablet (25 mg total) by mouth daily. 60 tablet 3  . potassium chloride SA (K-DUR) 20 MEQ tablet Take 1 tablet (20 mEq total) by mouth daily. 60 tablet 3  . Probiotic Product (PROBIOTIC PO) Take 1 capsule by mouth daily.    . QUEtiapine (SEROQUEL) 50 MG tablet Take 1 tablet (50 mg total) by mouth at bedtime. (Patient taking differently: Take 100 mg by mouth at bedtime. ) 30 tablet 0  . sertraline (ZOLOFT) 100 MG tablet Take 1 tablet (100 mg total) by mouth daily. 30 tablet 3  . spironolactone (ALDACTONE) 25 MG tablet Take 1 tablet (25 mg total) by mouth daily. 30 tablet 3  . torsemide (DEMADEX) 20 MG tablet Take 2 tablets (40 mg total) by mouth daily. 30 tablet 3  . traZODone (DESYREL) 50 MG tablet Take 0.5 tablets (25 mg total) by mouth at bedtime. 30 tablet 3  . vitamin B-12 (CYANOCOBALAMIN) 1000 MCG tablet Take 1,000 mcg by mouth daily.    Marland Kitchen. warfarin (COUMADIN) 5 MG tablet TAKE 1 TO 1 AND 1/2 TABLETS DAILY AS DIRECTED BY COUMADIN CLINIC. 45 tablet 0   No current facility-administered medications for this encounter.     Allergies:   Bee venom, Lisinopril, Penicillins, Perflutren lipid microsphere, Shellfish allergy, and Contrast media [iodinated diagnostic agents]    Social History:  The patient  reports that she quit smoking about 2 months ago. Her smoking use included cigarettes. She started smoking about 19 years ago. She has a 6.00 pack-year smoking history. She has never used smokeless tobacco. She reports previous alcohol use. She reports previous drug use.   Family History:  The patient's family history includes Heart disease in her paternal grandfather; Heart failure in her father.   ROS:  Please see the history of present illness.   All other systems are personally reviewed and negative.   Exam:   There were no vitals taken for this visit.  General: No respiratory distress Neck:  No JVD Lungs: Speaking in full sentences, no tachypnea.  Extremities: No edema.  Neuro: Alert/oriented Abdmen: Patient denies tenderness on self-palpation.   Recent Labs: 03/04/2019: TSH 3.797 03/27/2019: B Natriuretic Peptide 163.2 04/03/2019: ALT 16 04/11/2019: Hemoglobin 8.2; Platelets 345 04/13/2019: Magnesium 2.0 04/30/2019: BUN 11; Creatinine, Ser 0.78; Potassium 4.5; Sodium 135  Personally reviewed   Wt Readings from Last 3 Encounters:  05/10/19 100.2 kg (221 lb)  04/30/19 102.5 kg (226 lb)  04/13/19 101.9 kg (224 lb 11.2 oz)      ASSESSMENT AND PLAN:  1. Mitral valve disease: Patient had mitral valve repair for rheumatic heart disease-related MR in 7/14 in [place]. TEE from 6/20 showed that she had mild, eccentric MR. The mitral valve hasdbeen repaired, mean gradient 8 mmHg with MVA 1.85 cm^2 by PHT. Visually, the valve appeared significantly stenosed. Overall, the stenosis was probably moderate. She has not had documented atrial fibrillation. [place] 7/20 showed pulmonary venous hypertension and mildly elevate PCWP, so she doesnot appear to have a significant component of PAH from sarcoidosis causing RV failure. Now s/p redo MVR with mechanical valve and TV repair.  - Continue ASA 81 mg daily and warfarin with goal INR 2.5-3.5.  I will arrange for  CBC.  - She understands she will need antibiotic prophylaxis prior to dental procedure.  (2 grams of amoxicillin prior to dental procedures) .  - I will arrange for echo, need baseline post-valve replacement.  2. Chronic diastolic CHF with prominent RV failure on imaging: TEE in 6/20 with EF 45-50%, mildly dilated/dysfunctional RV with D-shaped septum, moderate mitral stenosis. The cardiac MRI in 6/20 showed LVEF 54%, RVEF 35%, and no myocardial or pericardial delayed enhancement (no evidence for cardiac sarcoidosis). [place] in 7/20 showed mild pulmonary venous hypertension and mildly elevated PCWP, no evidence for PAH from sarcoidosis, suspect CHF was from mitral stenosis primarily.NYHA class II per report, she does not appear to be volume overloaded.  - Continue torsemide 40 mg daily with KCl 20 daily.  I will arrange for BMET.  3. Pulmonary hypertension: PASP 88 mmHg on TTE, but RHC in 6/20 showed only mild pulmonary venous hypertension. Could have significant PAH in setting of sarcoidosis, would expect primarily pulmonary venous hypertension with mitral stenosis unless there has been pulmonary vascular remodeling. RHC in 7/20 again showed pulmonary venous hypertension, suggestive of mitral stenosis as cause of CHF rather than PAH from sarcoidosis with RV failure. She is now off oxygen.  - Not candidate for selective pulmonary vasodilators.  4. Sarcoidosis: Strongly suspected based on lung imaging, probable erythema nodosum, and lacrimal hyperplasia with dry eyes. Mediastinal node biopsy in 7/20 did not show sarcoidosis.  5. Palpitations: Patient reports frequent palpitations, no syncope/lightheadedness.  - I will arrange for 3 day Zio patch monitor.   COVID screen The patient does not have any symptoms that suggest any further testing/ screening at this time.  Social distancing reinforced today.  Patient Risk: After full review of this patients clinical status, I feel that they are at moderate  risk for cardiac decompensation at this time.  Relevant cardiac medications were reviewed at length with the patient today. The patient does not have concerns regarding their medications at this time.   Recommended follow-up:  6 wks  Today, I have spent 18 minutes with the patient with telehealth technology discussing the above issues .    Signed, Loralie Champagne, MD  07/02/2019 11:37 PM  [place] [address] [facility] [place] [address] [phone] (office) [phone] (fax)

## 2019-07-02 NOTE — Telephone Encounter (Signed)
Patient called to say she has no transportation to today's clinic appointment. She shared that she recently was evicted from her apartment and now resides in [place]. Due to remote area and challenges with limited transport options in her area the appointment was changed to a virtual appointment. Patient has an appointment next Monday at [facility] and will ask a friend to transport and CSW will assist with gas card to assure patient makes appointment. Patient grateful for the support and assistance. CSW continues to be available as needed. Raquel Sarna, [place], [place]

## 2019-07-02 NOTE — Telephone Encounter (Signed)
°  Precert needed for: Echo   Location:   [facility]   Date: Jun 25, 2019

## 2019-07-02 NOTE — Progress Notes (Signed)
Called patient, reviewed AVS.  Pt will get labs in 1 week and Echo at next availbe in November. MD ok with both.  Pt understands she will wear zio patch for 3 days. Device to be mailed to her home.  Pt aware to call 800 number for billing concerns as she does not have insurance. Verbalized understanding. avs mailed.

## 2019-07-03 ENCOUNTER — Ambulatory Visit (INDEPENDENT_AMBULATORY_CARE_PROVIDER_SITE_OTHER): Payer: Self-pay | Admitting: *Deleted

## 2019-07-03 DIAGNOSIS — Z5181 Encounter for therapeutic drug level monitoring: Secondary | ICD-10-CM

## 2019-07-03 DIAGNOSIS — Z954 Presence of other heart-valve replacement: Secondary | ICD-10-CM

## 2019-07-03 LAB — POCT INR: INR: 4.4 — AB (ref 2.0–3.0)

## 2019-07-03 NOTE — Patient Instructions (Signed)
Hold warfarin tonight then decrease dose to 1 tablet daily. Recheck INR in 2 week in [facility] Coumadin Clinic [phone]

## 2019-07-09 ENCOUNTER — Encounter: Payer: Self-pay | Admitting: Thoracic Surgery (Cardiothoracic Vascular Surgery)

## 2019-07-09 ENCOUNTER — Ambulatory Visit (INDEPENDENT_AMBULATORY_CARE_PROVIDER_SITE_OTHER): Payer: Self-pay | Admitting: Thoracic Surgery (Cardiothoracic Vascular Surgery)

## 2019-07-09 ENCOUNTER — Ambulatory Visit (HOSPITAL_COMMUNITY)
Admission: RE | Admit: 2019-07-09 | Discharge: 2019-07-09 | Disposition: A | Discharge status: Home / Self Care | Payer: Self-pay | Source: Ambulatory Visit | Attending: Cardiology | Admitting: Cardiology

## 2019-07-09 ENCOUNTER — Other Ambulatory Visit: Payer: Self-pay

## 2019-07-09 VITALS — BP 118/75 | HR 83 | Temp 98.1°F | Resp 16 | Ht 64.0 in | Wt 239.6 lb

## 2019-07-09 DIAGNOSIS — Z954 Presence of other heart-valve replacement: Secondary | ICD-10-CM

## 2019-07-09 DIAGNOSIS — Z9889 Other specified postprocedural states: Secondary | ICD-10-CM

## 2019-07-09 DIAGNOSIS — I5032 Chronic diastolic (congestive) heart failure: Secondary | ICD-10-CM | POA: Insufficient documentation

## 2019-07-09 LAB — CBC
HCT: 35.8 % — ABNORMAL LOW (ref 36.0–46.0)
Hemoglobin: 11.2 g/dL — ABNORMAL LOW (ref 12.0–15.0)
MCH: 25.6 pg — ABNORMAL LOW (ref 26.0–34.0)
MCHC: 31.3 g/dL (ref 30.0–36.0)
MCV: 81.9 fL (ref 80.0–100.0)
Platelets: 376 10*3/uL (ref 150–400)
RBC: 4.37 MIL/uL (ref 3.87–5.11)
RDW: 18.6 % — ABNORMAL HIGH (ref 11.5–15.5)
WBC: 7.7 10*3/uL (ref 4.0–10.5)
nRBC: 0 % (ref 0.0–0.2)

## 2019-07-09 LAB — BASIC METABOLIC PANEL
Anion gap: 8 (ref 5–15)
BUN: 19 mg/dL (ref 6–20)
CO2: 27 mmol/L (ref 22–32)
Calcium: 9 mg/dL (ref 8.9–10.3)
Chloride: 102 mmol/L (ref 98–111)
Creatinine, Ser: 1.04 mg/dL — ABNORMAL HIGH (ref 0.44–1.00)
GFR calc Af Amer: >60 mL/min (ref >60)
GFR calc non Af Amer: >60 mL/min (ref >60)
Glucose, Bld: 97 mg/dL (ref 70–99)
Potassium: 3.9 mmol/L (ref 3.5–5.1)
Sodium: 137 mmol/L (ref 135–145)

## 2019-07-09 NOTE — Progress Notes (Signed)
[address]       [place] [address]             [phone]     CARDIOTHORACIC SURGERY OFFICE NOTE  Referring Provider is Jerline Pain, MD Primary Cardiologist is Kate Sable, MD  Advanced Heart Failure Cardiologist is Larey Dresser, MD Primary Pulmonologist is Icard, Octavio Graves, MD PCP is Kerin Perna, NP   HPI:  Patient is an obese 38 year old African-American female with long standing history of mitral valve disease and chronic diastolic congestive heart failure status post mitral valve repair in 2014 who returns to the office today for routine follow-up status post redo median sternotomy for mitral valve replacement using a bileaflet mechanical prosthetic valve and tricuspid valve repair on April 05, 2019.  Her early postoperative recovery in the hospital was uneventful and she was discharged home on April 13, 2019.  Biopsy of anterior mediastinal lymph node performed at the time of surgery did not reveal findings consistent with sarcoidosis.  She was last seen here in our office on May 10, 2019 at which time she was doing fairly well.  She recently had a follow-up telemedicine visit with Dr. Aundra Dubin and reported that her breathing was noticeably improved since her mitral valve replacement.  A follow-up echocardiogram was ordered but has yet to be completed.  Most recent INR was 4.4 and her Coumadin dose has been adjusted through the Coumadin clinic.  Bobbye Charleston returns to the office today and reports that she is doing "exceptionally well".  She states that she feels better than she has felt in many many years.  Her breathing is dramatically improved.  She states that she only gets short of breath if she really pushes herself physically.  She no longer has any significant soreness related to the surgery.  She has no complaints and is very pleased with her outcome.   Current Outpatient Medications  Medication Sig Dispense Refill  . acetaminophen  (TYLENOL) 325 MG tablet Take 2 tablets (650 mg total) by mouth every 6 (six) hours as needed for mild pain.    Marland Kitchen albuterol (PROVENTIL HFA;VENTOLIN HFA) 108 (90 Base) MCG/ACT inhaler Inhale 2 puffs into the lungs every 6 (six) hours as needed for wheezing or shortness of breath.    Marland Kitchen aspirin EC 81 MG EC tablet Take 1 tablet (81 mg total) by mouth daily.    . budesonide (PULMICORT) 180 MCG/ACT inhaler Inhale 1 puff into the lungs 2 (two) times a day. 1 each 3  . busPIRone (BUSPAR) 7.5 MG tablet Take 1 tablet (7.5 mg total) by mouth 3 (three) times daily. 90 tablet 3  . clonazePAM (KLONOPIN) 0.5 MG tablet Take 1 tablet (0.5 mg total) by mouth 2 (two) times daily. 60 tablet 0  . docusate sodium (COLACE) 100 MG capsule Take 100 mg by mouth daily as needed for moderate constipation.     Marland Kitchen EPINEPHrine (EPIPEN 2-PAK) 0.3 mg/0.3 mL IJ SOAJ injection Inject 0.3 Units into the muscle once.     . ferrous sulfate 325 (65 FE) MG tablet Take 1 tablet (325 mg total) by mouth 2 (two) times daily with a meal. (Patient taking differently: Take 325 mg by mouth daily with breakfast. )  3  . folic acid (FOLVITE) 202 MCG tablet Take 400 mcg by mouth daily.    . hydrOXYzine (VISTARIL) 25 MG capsule Take 1 capsule (25 mg total) by mouth 3 (three) times daily as needed. 30 capsule  3  . metoprolol succinate (TOPROL-XL) 25 MG 24 hr tablet Take 1 tablet (25 mg total) by mouth daily. 60 tablet 3  . potassium chloride SA (K-DUR) 20 MEQ tablet Take 1 tablet (20 mEq total) by mouth daily. 60 tablet 3  . Probiotic Product (PROBIOTIC PO) Take 1 capsule by mouth daily.    . QUEtiapine (SEROQUEL) 50 MG tablet Take 1 tablet (50 mg total) by mouth at bedtime. (Patient taking differently: Take 100 mg by mouth at bedtime. ) 30 tablet 0  . sertraline (ZOLOFT) 100 MG tablet Take 1 tablet (100 mg total) by mouth daily. 30 tablet 3  . spironolactone (ALDACTONE) 25 MG tablet Take 1 tablet (25 mg total) by mouth daily. 30 tablet 3  . torsemide  (DEMADEX) 20 MG tablet Take 2 tablets (40 mg total) by mouth daily. 30 tablet 3  . traZODone (DESYREL) 50 MG tablet Take 0.5 tablets (25 mg total) by mouth at bedtime. 30 tablet 3  . vitamin B-12 (CYANOCOBALAMIN) 1000 MCG tablet Take 1,000 mcg by mouth daily.    Marland Kitchen warfarin (COUMADIN) 5 MG tablet TAKE 1 TO 1 AND 1/2 TABLETS DAILY AS DIRECTED BY COUMADIN CLINIC. 45 tablet 0   No current facility-administered medications for this visit.       Physical Exam:   BP 118/75 (BP Location: Right Arm)   Pulse 83   Temp 98.1 F (36.7 C) (Skin)   Resp 16   Ht 5\' 4"  (1.626 m)   Wt 239 lb 9.6 oz (108.7 kg)   SpO2 97% Comment: RA  BMI 41.13 kg/m   General:  Well-appearing  Chest:   Clear to auscultation  CV:   Regular rate and rhythm with mechanical heart valve sounds  Incisions:  Completely healed, sternum is stable  Abdomen:  Soft nontender  Extremities:  Warm and well-perfused  Diagnostic Tests:  n/a   Impression:  Patient is doing very well approximately 3 months status post redo median sternotomy for mitral valve replacement and tricuspid valve repair   Plan:  We have not recommended any changes to the patient's current medications.  I have encouraged the patient to continue to increase her physical activity without any particular limitations.  The patient has been reminded regarding the importance of dental hygiene and the lifelong need for antibiotic prophylaxis for all dental cleanings and other related invasive procedures.  I reminded her of the results of the lymph node biopsy performed at the time of her surgery.  She will continue to follow-up with Dr. , Dr. Shirlee Latch and Dr. Purvis Sheffield.  She will return to our office for routine follow-up next summer approximately 1 year following her surgery.  She will call and return sooner only should specific problems or questions arise.  I spent in excess of 15 minutes during the conduct of this office consultation and >50% of this time  involved direct face-to-face encounter with the patient for counseling and/or coordination of their care.    Tonia Brooms. Salvatore Decent, MD 07/09/2019 12:35 PM

## 2019-07-09 NOTE — Patient Instructions (Signed)

## 2019-07-17 ENCOUNTER — Ambulatory Visit (INDEPENDENT_AMBULATORY_CARE_PROVIDER_SITE_OTHER): Payer: Self-pay | Admitting: *Deleted

## 2019-07-17 ENCOUNTER — Other Ambulatory Visit: Payer: Self-pay

## 2019-07-17 DIAGNOSIS — Z954 Presence of other heart-valve replacement: Secondary | ICD-10-CM

## 2019-07-17 DIAGNOSIS — Z5181 Encounter for therapeutic drug level monitoring: Secondary | ICD-10-CM

## 2019-07-17 LAB — POCT INR: INR: 3.7 — AB (ref 2.0–3.0)

## 2019-07-17 NOTE — Patient Instructions (Signed)
Hold warfarin tonight then decrease dose to 1 tablet daily except 1/2 tablet on Sundays. Recheck INR in 3 week in [facility]. Coumadin Clinic [phone]

## 2019-07-20 DIAGNOSIS — Z736 Limitation of activities due to disability: Secondary | ICD-10-CM

## 2019-07-23 ENCOUNTER — Other Ambulatory Visit (HOSPITAL_COMMUNITY): Payer: Self-pay | Admitting: Cardiovascular Disease

## 2019-07-23 ENCOUNTER — Other Ambulatory Visit (HOSPITAL_COMMUNITY): Payer: Self-pay | Admitting: Adult Health

## 2019-07-24 ENCOUNTER — Telehealth: Payer: Self-pay | Admitting: Pulmonary Disease

## 2019-07-24 MED ORDER — BUDESONIDE 0.5 MG/2ML IN SUSP: 0.5000 mg | Freq: Two times a day (BID) | RESPIRATORY_TRACT | 1 refills | Status: DC

## 2019-07-24 NOTE — Telephone Encounter (Signed)
Call returned to patient, confirmed DOB, requesting pulmicort be sent to pharmacy. Confirmed pharmacy. Medication sent.   Nothing further needed at this time.

## 2019-07-25 ENCOUNTER — Telehealth (INDEPENDENT_AMBULATORY_CARE_PROVIDER_SITE_OTHER): Payer: Self-pay

## 2019-07-25 ENCOUNTER — Other Ambulatory Visit (INDEPENDENT_AMBULATORY_CARE_PROVIDER_SITE_OTHER): Payer: Self-pay | Admitting: Primary Care

## 2019-07-25 MED ORDER — HYDROXYZINE PAMOATE 25 MG PO CAPS: 25.0000 mg | ORAL_CAPSULE | Freq: Three times a day (TID) | ORAL | 3 refills | Status: DC | PRN

## 2019-07-25 NOTE — Telephone Encounter (Signed)
Patient called to make a medication refill for   hydrOXYzine (VISTARIL) 25 MG capsule    Patient uses   [place] [place] [address] [place]   Please advice [phone]

## 2019-07-25 NOTE — Telephone Encounter (Signed)
FWD to PCP. Theresa Crawford, CMA  

## 2019-07-26 ENCOUNTER — Other Ambulatory Visit: Payer: Self-pay

## 2019-07-27 ENCOUNTER — Ambulatory Visit: Payer: Medicaid Other | Admitting: Pulmonary Disease

## 2019-08-01 ENCOUNTER — Ambulatory Visit: Payer: Medicaid Other | Admitting: Pulmonary Disease

## 2019-08-13 ENCOUNTER — Encounter (HOSPITAL_COMMUNITY): Payer: Medicaid Other | Admitting: Cardiology

## 2019-08-28 ENCOUNTER — Other Ambulatory Visit (HOSPITAL_COMMUNITY): Payer: Self-pay | Admitting: Adult Health

## 2019-10-15 ENCOUNTER — Ambulatory Visit (INDEPENDENT_AMBULATORY_CARE_PROVIDER_SITE_OTHER): Payer: Medicaid Other | Admitting: Primary Care

## 2019-10-16 ENCOUNTER — Other Ambulatory Visit (HOSPITAL_COMMUNITY): Payer: Self-pay | Admitting: Adult Health

## 2019-10-21 ENCOUNTER — Encounter (HOSPITAL_COMMUNITY): Payer: Self-pay

## 2019-10-21 ENCOUNTER — Other Ambulatory Visit: Payer: Self-pay

## 2019-10-21 ENCOUNTER — Emergency Department (HOSPITAL_COMMUNITY)
Admission: EM | Admit: 2019-10-21 | Discharge: 2019-10-21 | Disposition: A | Discharge status: Home / Self Care | Payer: Medicaid Other | Attending: Emergency Medicine | Admitting: Emergency Medicine

## 2019-10-21 DIAGNOSIS — K0889 Other specified disorders of teeth and supporting structures: Secondary | ICD-10-CM

## 2019-10-21 DIAGNOSIS — F17210 Nicotine dependence, cigarettes, uncomplicated: Secondary | ICD-10-CM | POA: Insufficient documentation

## 2019-10-21 LAB — CBC WITH DIFFERENTIAL/PLATELET
Abs Immature Granulocytes: 0.02 10*3/uL (ref 0.00–0.07)
Basophils Absolute: 0.1 10*3/uL (ref 0.0–0.1)
Basophils Relative: 1 %
Eosinophils Absolute: 0.2 10*3/uL (ref 0.0–0.5)
Eosinophils Relative: 2 %
HCT: 35.5 % — ABNORMAL LOW (ref 36.0–46.0)
Hemoglobin: 10.8 g/dL — ABNORMAL LOW (ref 12.0–15.0)
Immature Granulocytes: 0 %
Lymphocytes Relative: 18 %
Lymphs Abs: 1.7 10*3/uL (ref 0.7–4.0)
MCH: 26.9 pg (ref 26.0–34.0)
MCHC: 30.4 g/dL (ref 30.0–36.0)
MCV: 88.5 fL (ref 80.0–100.0)
Monocytes Absolute: 0.6 10*3/uL (ref 0.1–1.0)
Monocytes Relative: 7 %
Neutro Abs: 6.9 10*3/uL (ref 1.7–7.7)
Neutrophils Relative %: 72 %
Platelets: 365 10*3/uL (ref 150–400)
RBC: 4.01 MIL/uL (ref 3.87–5.11)
RDW: 16.5 % — ABNORMAL HIGH (ref 11.5–15.5)
WBC: 9.5 10*3/uL (ref 4.0–10.5)
nRBC: 0 % (ref 0.0–0.2)

## 2019-10-21 MED ORDER — HYDROCODONE-ACETAMINOPHEN 5-325 MG PO TABS
1.0000 | ORAL_TABLET | Freq: Once | ORAL | Status: AC
  Administered 2019-10-21: 1 via ORAL
  Filled 2019-10-21: qty 1

## 2019-10-21 MED ORDER — CLINDAMYCIN HCL 300 MG PO CAPS: 300.0000 mg | ORAL_CAPSULE | Freq: Three times a day (TID) | ORAL | 0 refills | Status: AC

## 2019-10-21 MED ORDER — HYDROCODONE-ACETAMINOPHEN 5-325 MG PO TABS: 1.0000 | ORAL_TABLET | ORAL | 0 refills | Status: DC | PRN

## 2019-10-21 MED ORDER — CLINDAMYCIN HCL 150 MG PO CAPS
300.0000 mg | ORAL_CAPSULE | Freq: Once | ORAL | Status: AC
  Administered 2019-10-21: 300 mg via ORAL
  Filled 2019-10-21: qty 2

## 2019-10-21 NOTE — ED Provider Notes (Signed)
[facility] EMERGENCY DEPARTMENT Provider Note   CSN: [id number] Arrival date & time: 10/21/19  1827     History Chief Complaint  Patient presents with  . Dental Pain    Theresa Crawford is a 39 y.o. female.  The history is provided by the patient. No language interpreter was used.  Dental Pain Location:  Upper Upper teeth location:  3/RU 1st molar Quality:  Aching Severity:  Moderate Onset quality:  Gradual Timing:  Constant Progression:  Worsening Chronicity:  New Relieved by:  Nothing Worsened by:  Nothing Ineffective treatments:  Ice Associated symptoms: gum swelling   Associated symptoms: no fever   Pt complains of right sided dental pain.  Pt complains of dental issues since having a valve replacement.  Pt is on coumadin.  No bleeding      Past Medical History:  Diagnosis Date  . Anxiety   . Arthritis    "lower back" (04/04/2018)  . Bipolar disorder (HCC)   . Chronic bronchitis (HCC)   . Chronic diastolic CHF (congestive heart failure) (HCC)   . Chronic lower back pain   . DDD (degenerative disc disease), lumbar   . Depression   . Dyspnea   . GERD (gastroesophageal reflux disease)   . Headache    "weekly" (04/04/2018), miragrains in past (04/03/2019)  . Heart murmur   . Hypertension   . Mitral valve disease   . Normocytic anemia   . Obesity   . Palpitations   . Pericarditis   . Pneumonia   . Premature atrial contractions   . Pulmonary hypertension (HCC)   . PVC's (premature ventricular contractions)    a. h/o palpitations with event monitor in 03/2017 showing NSR with PACs/PVCs.  . Recurrent mitral valve stenosis and regurgitation s/p mitral valve repair   . S/P mitral valve repair 03/27/2013   Dr. Darcus Austin - [place], [place] - complex valvuloplasty including resuspension of entire posterior leaflet using Gore-tex neochords and 30 mm Edwards Physio ring annuloplasty  . S/P redo mitral valve replacement with mechanical valve 04/05/2019   29 mm Sorin  Carbomedics Optiform bileaflet mechanical valve  . S/P tricuspid valve repair 04/05/2019   28 mm Edwards mc3 ring annuloplasty  . Sarcoidosis   . Tobacco abuse   . Tricuspid regurgitation     Patient Active Problem List   Diagnosis Date Noted  . S/P redo mitral valve replacement with mechanical valve 04/05/2019  . S/P tricuspid valve repair 04/05/2019  . Obesity (BMI 30-39.9) 03/29/2019  . Sarcoidosis   . Acute on chronic diastolic congestive heart failure (HCC) 03/24/2019  . Normocytic anemia   . Dysuria 03/19/2019  . Acute CHF (congestive heart failure) (HCC) 03/04/2019  . MDD (major depressive disorder), recurrent episode (HCC) 04/05/2018  . S/P tooth extraction 04/03/2018  . Suicidal ideation 04/03/2018  . MDD (major depressive disorder)   . Chronic diastolic congestive heart failure (HCC)   . Tricuspid regurgitation   . Hypomagnesemia 03/07/2018  . Tobacco abuse   . Dyspnea 01/11/2018  . Prolonged QT interval 09/10/2017  . Normochromic normocytic anemia   . Pulmonary edema 07/05/2017  . Hypokalemia 06/12/2017  . Pulmonary hypertension (HCC) 06/12/2017  . Flash pulmonary edema (HCC) 06/11/2017  . Depression with anxiety 06/11/2017  . Recurrent mitral valve stenosis and regurgitation s/p mitral valve repair   . Acute pulmonary edema (HCC) 10/10/2016  . CAP (community acquired pneumonia) 05/25/2016  . Leukocytosis 05/24/2016  . Acute respiratory failure with hypoxia (HCC) 05/23/2016  . Acute  on chronic diastolic CHF (congestive heart failure) ([place]) 05/23/2016  . Cough with hemoptysis 05/23/2016  . Postpartum complication pericarditis in 2009 with eventual needing MVR in 2015 05/23/2016  . Anemia, iron deficiency 05/23/2016  . Hypertension   . SOB (shortness of breath)   . Respiratory distress 02/16/2016  . Essential hypertension 02/16/2016  . S/P mitral valve repair 03/27/2013    Past Surgical History:  Procedure Laterality Date  . ABDOMINAL HERNIA REPAIR  ~ 2011    . CARDIAC CATHETERIZATION  2014  . CESAREAN SECTION  2004; 2009  . HERNIA REPAIR    . MITRAL VALVE REPAIR  03/27/2013   Dr. Fransico Michael - [place], [place]. - complex valvuloplasty including resuspension of posterior leaflet with 30 mm CE Physio ring annuloplasty  . MITRAL VALVE REPLACEMENT N/A 04/05/2019   Procedure: Mitral Valve (Mv) Replacement using Carbomedics Optiform Valve size 8mm;  Surgeon: Rexene Alberts, MD;  Location: [place];  Service: Open Heart Surgery;  Laterality: N/A;  . MULTIPLE EXTRACTIONS WITH ALVEOLOPLASTY N/A 04/03/2018   Procedure: Extraction of tooth #30 with alveoloplasty and gross debridement of remaining teeth;  Surgeon: Lenn Cal, DDS;  Location: [place];  Service: Oral Surgery;  Laterality: N/A;  . PARTIAL HYSTERECTOMY  2011   PID w/problem with fallopian tubes, had left fallopian and left ovary removed, pt still having periods  . RIGHT HEART CATH N/A 03/27/2019   Procedure: RIGHT HEART CATH;  Surgeon: Larey Dresser, MD;  Location: [place] [facility];  Service: Cardiovascular;  Laterality: N/A;  . RIGHT/LEFT HEART CATH AND CORONARY ANGIOGRAPHY N/A 03/05/2019   Procedure: RIGHT/LEFT HEART CATH AND CORONARY ANGIOGRAPHY;  Surgeon: Burnell Blanks, MD;  Location: [place] [facility];  Service: Cardiovascular;  Laterality: N/A;  . TEE WITHOUT CARDIOVERSION N/A 05/26/2016   Procedure: TRANSESOPHAGEAL ECHOCARDIOGRAM (TEE);  Surgeon: Herminio Commons, MD;  [facility];  Service: Cardiovascular;  Laterality: N/A;  . TEE WITHOUT CARDIOVERSION N/A 01/25/2018   Procedure: TRANSESOPHAGEAL ECHOCARDIOGRAM (TEE) WITH PROPOFOL;  Surgeon: Satira Sark, MD;  [facility]: [facility];  Service: Cardiovascular;  Laterality: N/A;  . TEE WITHOUT CARDIOVERSION N/A 03/05/2019   Procedure: TRANSESOPHAGEAL ECHOCARDIOGRAM (TEE);  Surgeon: Fay Records, MD;  Location: [place];  Service: Cardiovascular;  Laterality: N/A;  . TEE WITHOUT CARDIOVERSION N/A  04/05/2019   Procedure: TRANSESOPHAGEAL ECHOCARDIOGRAM (TEE);  Surgeon: Rexene Alberts, MD;  Location: [place];  Service: Open Heart Surgery;  Laterality: N/A;  . TRICUSPID VALVE REPLACEMENT N/A 04/05/2019   Procedure: Tricuspid Valve Repair using Edwards MC3 Tricuspid ring size 42mm;  Surgeon: Rexene Alberts, MD;  Location: [place];  Service: Open Heart Surgery;  Laterality: N/A;     OB History    Gravida  2   Para  2   Term  2   Preterm      AB      Living        SAB      TAB      Ectopic      Multiple      Live Births              Family History  Problem Relation Age of Onset  . Heart failure Father        just received LVAD  . Heart disease Paternal Grandfather        stent placement    Social History   Tobacco Use  . Smoking status: Current Every Day  Smoker    Packs/day: 0.25    Years: 24.00    Pack years: 6.00    Types: Cigarettes    Start date: 11/03/1999  . Smokeless tobacco: Never Used  Substance Use Topics  . Alcohol use: Not Currently    Comment: 04/04/2018 "1-2 beers/month; if that"  . Drug use: Not Currently    Home Medications Prior to Admission medications   Medication Sig Start Date End Date Taking? Authorizing Provider  acetaminophen (TYLENOL) 325 MG tablet Take 2 tablets (650 mg total) by mouth every 6 (six) hours as needed for mild pain. 04/13/19   Ardelle Balls, PA-C  albuterol (PROVENTIL HFA;VENTOLIN HFA) 108 (90 Base) MCG/ACT inhaler Inhale 2 puffs into the lungs every 6 (six) hours as needed for wheezing or shortness of breath.    [provider]  aspirin EC 81 MG EC tablet Take 1 tablet (81 mg total) by mouth daily. 04/13/19   Doree Fudge M, PA-C  budesonide (PULMICORT) 0.5 MG/2ML nebulizer solution Take 2 mLs (0.5 mg total) by nebulization 2 (two) times daily. 07/24/19 08/23/19  Icard, Elige Radon L, DO  budesonide (PULMICORT) 180 MCG/ACT inhaler Inhale 1 puff into the lungs 2 (two) times a day. 03/29/19   Rama,  Maryruth Bun, MD  busPIRone (BUSPAR) 7.5 MG tablet Take 1 tablet (7.5 mg total) by mouth 3 (three) times daily. 05/05/19   Grayce Sessions, NP  clonazePAM (KLONOPIN) 0.5 MG tablet Take 1 tablet (0.5 mg total) by mouth 2 (two) times daily. 03/08/19   Almon Hercules, MD  docusate sodium (COLACE) 100 MG capsule Take 100 mg by mouth daily as needed for moderate constipation.     [provider]  EPINEPHrine (EPIPEN 2-PAK) 0.3 mg/0.3 mL IJ SOAJ injection Inject 0.3 Units into the muscle once.     [provider]  ferrous sulfate 325 (65 FE) MG tablet Take 1 tablet (325 mg total) by mouth 2 (two) times daily with a meal. Patient taking differently: Take 325 mg by mouth daily with breakfast.  03/08/18   Philip Aspen, Limmie Patricia, MD  folic acid (FOLVITE) 800 MCG tablet Take 400 mcg by mouth daily.    [provider]  hydrOXYzine (VISTARIL) 25 MG capsule Take 1 capsule (25 mg total) by mouth 3 (three) times daily as needed. 07/25/19   Grayce Sessions, NP  metoprolol succinate (TOPROL-XL) 25 MG 24 hr tablet Take 1 tablet (25 mg total) by mouth daily. 06/14/19   Clegg, Amy D, NP  potassium chloride SA (K-DUR) 20 MEQ tablet Take 1 tablet (20 mEq total) by mouth daily. 05/14/19   Bensimhon, Bevelyn Buckles, MD  Probiotic Product (PROBIOTIC PO) Take 1 capsule by mouth daily.    [provider]  QUEtiapine (SEROQUEL) 50 MG tablet Take 1 tablet (50 mg total) by mouth at bedtime. Patient taking differently: Take 100 mg by mouth at bedtime.  04/15/18   Oneta Rack, NP  sertraline (ZOLOFT) 100 MG tablet Take 1 tablet (100 mg total) by mouth daily. 05/15/19   Grayce Sessions, NP  spironolactone (ALDACTONE) 25 MG tablet Take 1 tablet by mouth once daily 07/23/19   Filbert Schilder, Amy D, NP  torsemide (DEMADEX) 20 MG tablet Take 2 tablets by mouth once daily 10/16/19   Clegg, Amy D, NP  traZODone (DESYREL) 50 MG tablet Take 0.5 tablets (25 mg total) by mouth at bedtime. 06/01/19   Grayce Sessions, NP  vitamin B-12 (CYANOCOBALAMIN) 1000 MCG tablet Take 1,000  mcg by mouth daily.    [provider]  warfarin (COUMADIN) 5 MG tablet Take 1 tablet daily except 1/2 tablet on Sundays or as directed 07/23/19   Koneswaran, Suresh A, MD    Allergies    Bee venom, Lisinopril, Penicillins, Perflutren lipid microsphere, Shellfish allergy, and Contrast media [iodinated diagnostic agents]  Review of Systems   Review of Systems  Constitutional: Negative for fever.  All other systems reviewed and are negative.   Physical Exam Updated Vital Signs BP 115/73 (BP Location: Right Arm)   Pulse 88   Temp 98.2 F (36.8 C) (Oral)   Resp 18   Ht 5\' 4" (1.626 m)   Wt 102.1 kg   LMP 10/19/2019   SpO2 100%   BMI 38.62 kg/m   Physical Exam Vitals reviewed.  HENT:     Right Ear: Tympanic membrane normal.     Left Ear: Tympanic membrane normal.     Nose: Nose normal.     Mouth/Throat:     Comments: Slight swelling right upper gum  Cardiovascular:     Rate and Rhythm: Normal rate.     Pulses: Normal pulses.  Pulmonary:     Effort: Pulmonary effort is normal.  Musculoskeletal:        General: Normal range of motion.     Cervical back: Normal range of motion.  Skin:    General: Skin is warm.  Neurological:     General: No focal deficit present.     Mental Status: She is alert.  Psychiatric:        Mood and Affect: Mood normal.     ED Results / Procedures / Treatments   Labs (all labs ordered are listed, but only abnormal results are displayed) Labs Reviewed  CBC WITH DIFFERENTIAL/PLATELET - Abnormal; Notable for the following components:      Result Value   Hemoglobin 10.8 (*)    HCT 35.5 (*)    RDW 16.5 (*)    All other components within normal limits    EKG None  Radiology No results found.  Procedures Procedures (including critical care time)  Medications Ordered in ED Medications  clindamycin (CLEOCIN) capsule 300 mg (has no administration in time range)      ED Course  I have reviewed the triage vital signs and the nursing notes.  Pertinent labs & imaging results that were available during my care of the patient were reviewed by me and considered in my medical decision making (see chart for details).    MDM Rules/Calculators/A&P                      MDM: Pt reports she has been eating a lot of ice and thinks this may cause dental pain Final Clinical Impression(s) / ED Diagnoses Final diagnoses:  Pain, dental    Rx / DC Orders ED Discharge Orders         Ordered    clindamycin (CLEOCIN) 300 MG capsule  3 times daily     02 /07/21 1948        An After Visit Summary was printed and given to the patient.    01[birth year]1990, Elson Areas 10/21/19 1949    12/19/19, MD 10/21/19 [phone]

## 2019-10-21 NOTE — Discharge Instructions (Addendum)
See your Dentist for recheck.  Return if any problems.

## 2019-10-21 NOTE — ED Triage Notes (Signed)
Pt presents to ED with complaints of right upper dental pain x 2 days. Pt states she has also had facial swelling

## 2019-10-31 ENCOUNTER — Encounter (INDEPENDENT_AMBULATORY_CARE_PROVIDER_SITE_OTHER): Payer: Self-pay | Admitting: Primary Care

## 2019-10-31 ENCOUNTER — Ambulatory Visit (INDEPENDENT_AMBULATORY_CARE_PROVIDER_SITE_OTHER): Payer: Self-pay | Admitting: Primary Care

## 2019-10-31 ENCOUNTER — Other Ambulatory Visit: Payer: Self-pay

## 2019-10-31 DIAGNOSIS — F411 Generalized anxiety disorder: Secondary | ICD-10-CM

## 2019-10-31 DIAGNOSIS — F331 Major depressive disorder, recurrent, moderate: Secondary | ICD-10-CM

## 2019-10-31 DIAGNOSIS — Z760 Encounter for issue of repeat prescription: Secondary | ICD-10-CM

## 2019-10-31 MED ORDER — QUETIAPINE FUMARATE 50 MG PO TABS: 50.0000 mg | ORAL_TABLET | Freq: Every day | ORAL | 0 refills | Status: DC

## 2019-10-31 MED ORDER — TRAZODONE HCL 50 MG PO TABS: 25.0000 mg | ORAL_TABLET | Freq: Every day | ORAL | 1 refills | Status: DC

## 2019-10-31 MED ORDER — BUSPIRONE HCL 7.5 MG PO TABS: 7.5000 mg | ORAL_TABLET | Freq: Three times a day (TID) | ORAL | 3 refills | Status: DC

## 2019-10-31 MED ORDER — SERTRALINE HCL 100 MG PO TABS: 100.0000 mg | ORAL_TABLET | Freq: Every day | ORAL | 3 refills | Status: DC

## 2019-10-31 NOTE — Progress Notes (Signed)
Virtual Visit via Telephone Note  I connected with Norwood Levo on 10/31/19 at  9:10 AM EST by telephone and verified that I am speaking with the correct person using two identifiers.   I discussed the limitations, risks, security and privacy concerns of performing an evaluation and management service by telephone and the availability of in person appointments. I also discussed with the patient that there may be a patient responsible charge related to this service. The patient expressed understanding and agreed to proceed.   History of Present Illness: [name] is having a tele visit for medication refills that were not prescribe by writer . She was taken to her appointment by integrated care  case worker to [facility] she waited a total of 6 hrs and was more frustrated and aggrevated before she came and prefers not to go back there.   Past Medical History:  Diagnosis Date  . Anxiety   . Arthritis    "lower back" (04/04/2018)  . Bipolar disorder (HCC)   . Chronic bronchitis (HCC)   . Chronic diastolic CHF (congestive heart failure) (HCC)   . Chronic lower back pain   . DDD (degenerative disc disease), lumbar   . Depression   . Dyspnea   . GERD (gastroesophageal reflux disease)   . Headache    "weekly" (04/04/2018), miragrains in past (04/03/2019)  . Heart murmur   . Hypertension   . Mitral valve disease   . Normocytic anemia   . Obesity   . Palpitations   . Pericarditis   . Pneumonia   . Premature atrial contractions   . Pulmonary hypertension (HCC)   . PVC's (premature ventricular contractions)    a. h/o palpitations with event monitor in 03/2017 showing NSR with PACs/PVCs.  . Recurrent mitral valve stenosis and regurgitation s/p mitral valve repair   . S/P mitral valve repair 03/27/2013   Dr. Darcus Austin - [place], [place] - complex valvuloplasty including resuspension of entire posterior leaflet using Gore-tex neochords and 30 mm Areya Lemmerman Physio ring annuloplasty  .  S/P redo mitral valve replacement with mechanical valve 04/05/2019   29 mm Sorin Carbomedics Optiform bileaflet mechanical valve  . S/P tricuspid valve repair 04/05/2019   28 mm Mckinley Adelstein mc3 ring annuloplasty  . Sarcoidosis   . Tobacco abuse   . Tricuspid regurgitation    Current Outpatient Medications on File Prior to Visit  Medication Sig Dispense Refill  . acetaminophen (TYLENOL) 325 MG tablet Take 2 tablets (650 mg total) by mouth every 6 (six) hours as needed for mild pain.    Marland Kitchen albuterol (PROVENTIL HFA;VENTOLIN HFA) 108 (90 Base) MCG/ACT inhaler Inhale 2 puffs into the lungs every 6 (six) hours as needed for wheezing or shortness of breath.    Marland Kitchen aspirin EC 81 MG EC tablet Take 1 tablet (81 mg total) by mouth daily.    . budesonide (PULMICORT) 180 MCG/ACT inhaler Inhale 1 puff into the lungs 2 (two) times a day. 1 each 3  . busPIRone (BUSPAR) 7.5 MG tablet Take 1 tablet (7.5 mg total) by mouth 3 (three) times daily. 90 tablet 3  . clonazePAM (KLONOPIN) 0.5 MG tablet Take 1 tablet (0.5 mg total) by mouth 2 (two) times daily. 60 tablet 0  . docusate sodium (COLACE) 100 MG capsule Take 100 mg by mouth daily as needed for moderate constipation.     Marland Kitchen EPINEPHrine (EPIPEN 2-PAK) 0.3 mg/0.3 mL IJ SOAJ injection Inject 0.3 Units into the muscle once.     Marland Kitchen  ferrous sulfate 325 (65 FE) MG tablet Take 1 tablet (325 mg total) by mouth 2 (two) times daily with a meal. (Patient taking differently: Take 325 mg by mouth daily with breakfast. )  3  . folic acid (FOLVITE) 800 MCG tablet Take 400 mcg by mouth daily.    Marland Kitchen HYDROcodone-acetaminophen (NORCO/VICODIN) 5-325 MG tablet Take 1 tablet by mouth every 4 (four) hours as needed. 10 tablet 0  . hydrOXYzine (VISTARIL) 25 MG capsule Take 1 capsule (25 mg total) by mouth 3 (three) times daily as needed. 30 capsule 3  . metoprolol succinate (TOPROL-XL) 25 MG 24 hr tablet Take 1 tablet (25 mg total) by mouth daily. 60 tablet 3  . potassium chloride SA (K-DUR) 20  MEQ tablet Take 1 tablet (20 mEq total) by mouth daily. 60 tablet 3  . Probiotic Product (PROBIOTIC PO) Take 1 capsule by mouth daily.    . QUEtiapine (SEROQUEL) 50 MG tablet Take 1 tablet (50 mg total) by mouth at bedtime. (Patient taking differently: Take 100 mg by mouth at bedtime. ) 30 tablet 0  . sertraline (ZOLOFT) 100 MG tablet Take 1 tablet (100 mg total) by mouth daily. 30 tablet 3  . spironolactone (ALDACTONE) 25 MG tablet Take 1 tablet by mouth once daily 90 tablet 0  . torsemide (DEMADEX) 20 MG tablet Take 2 tablets by mouth once daily 60 tablet 0  . traZODone (DESYREL) 50 MG tablet Take 0.5 tablets (25 mg total) by mouth at bedtime. 30 tablet 3  . vitamin B-12 (CYANOCOBALAMIN) 1000 MCG tablet Take 1,000 mcg by mouth daily.    Marland Kitchen warfarin (COUMADIN) 5 MG tablet Take 1 tablet daily except 1/2 tablet on Sundays or as directed 30 tablet 3  . budesonide (PULMICORT) 0.5 MG/2ML nebulizer solution Take 2 mLs (0.5 mg total) by nebulization 2 (two) times daily. 120 mL 1   No current facility-administered medications on file prior to visit.   Observations/Objective: Review of Systems  Psychiatric/Behavioral: Positive for depression. The patient is nervous/anxious and has insomnia.   All other systems reviewed and are negative.  Assessment and Plan: [name] was seen today for medication refill.  Diagnoses and all orders for this visit:  Moderate episode of recurrent major depressive disorder (HCC) Managed with Zoloft 100 mg take daily refilled.  Generalized anxiety disorder Managed with buspar she states it helps with panis attacks and anxiety no changes in medications continue to monitor  busPIRone (BUSPAR) 7.5 MG tablet; Take 1 tablet (7.5 mg total) by mouth 3 (three) times daily.  Medication refill Explained would not prescribe ativan and  she will have to follow up with CSW and refer to psyche. Patient understands and is in agreement with plan.   Other orders -     QUEtiapine  (SEROQUEL) 50 MG tablet; Take 1 tablet (50 mg total) by mouth at bedtime. -     busPIRone (BUSPAR) 7.5 MG tablet; Take 1 tablet (7.5 mg total) by mouth 3 (three) times daily. -     sertraline (ZOLOFT) 100 MG tablet; Take 1 tablet (100 mg total) by mouth daily. -     traZODone (DESYREL) 50 MG tablet; Take 0.5 tablets (25 mg total) by mouth at bedtime.    Follow Up Instructions:    I discussed the assessment and treatment plan with the patient. The patient was provided an opportunity to ask questions and all were answered. The patient agreed with the plan and demonstrated an understanding of the instructions.   The patient  was advised to call back or seek an in-person evaluation if the symptoms worsen or if the condition fails to improve as anticipated.  I provided 12 minutes of non-face-to-face time during this encounter.   Kerin Perna, NP

## 2019-12-11 ENCOUNTER — Telehealth (HOSPITAL_COMMUNITY): Payer: Self-pay | Admitting: Surgery

## 2019-12-11 ENCOUNTER — Other Ambulatory Visit (HOSPITAL_COMMUNITY): Payer: Self-pay | Admitting: *Deleted

## 2019-12-11 MED ORDER — POTASSIUM CHLORIDE CRYS ER 20 MEQ PO TBCR: 20.0000 meq | EXTENDED_RELEASE_TABLET | Freq: Every day | ORAL | 3 refills | Status: DC

## 2019-12-11 MED ORDER — METOPROLOL SUCCINATE ER 25 MG PO TB24: 25.0000 mg | ORAL_TABLET | Freq: Every day | ORAL | 3 refills | Status: DC

## 2019-12-11 MED ORDER — SPIRONOLACTONE 25 MG PO TABS: 25.0000 mg | ORAL_TABLET | Freq: Every day | ORAL | 3 refills | Status: DC

## 2019-12-11 MED ORDER — TORSEMIDE 20 MG PO TABS: 40.0000 mg | ORAL_TABLET | Freq: Every day | ORAL | 3 refills | Status: DC

## 2019-12-11 NOTE — Telephone Encounter (Signed)
I spoke with Theresa Crawford regarding her [employer] order and inquired as to if she received the Zio monitor in the mail.  She tells me that she never received it and therefore that is why it was never mailed back.  She also requested a follow-up appt in the [facility].  I have forwarded her request on to clinic coordinator and scheduling to try to work her in for follow-up appt.

## 2019-12-12 ENCOUNTER — Telehealth (HOSPITAL_COMMUNITY): Payer: Self-pay | Admitting: Vascular Surgery

## 2019-12-12 NOTE — Telephone Encounter (Signed)
Left pt VM , giving f/u appt w/ app 12/31/19 @ 12 noon, asked pt to call back to confirm appt

## 2019-12-14 ENCOUNTER — Other Ambulatory Visit (HOSPITAL_COMMUNITY): Payer: Self-pay | Admitting: Adult Health

## 2019-12-31 ENCOUNTER — Encounter (HOSPITAL_COMMUNITY): Payer: Medicaid Other

## 2020-01-10 ENCOUNTER — Encounter (HOSPITAL_COMMUNITY): Payer: Self-pay

## 2020-01-10 ENCOUNTER — Emergency Department (HOSPITAL_COMMUNITY)
Admission: EM | Admit: 2020-01-10 | Discharge: 2020-01-10 | Disposition: A | Discharge status: Home / Self Care | Payer: Medicaid Other | Attending: Emergency Medicine | Admitting: Emergency Medicine

## 2020-01-10 ENCOUNTER — Other Ambulatory Visit: Payer: Self-pay

## 2020-01-10 DIAGNOSIS — M545 Low back pain: Secondary | ICD-10-CM | POA: Insufficient documentation

## 2020-01-10 DIAGNOSIS — Z5321 Procedure and treatment not carried out due to patient leaving prior to being seen by health care provider: Secondary | ICD-10-CM | POA: Insufficient documentation

## 2020-01-10 NOTE — ED Triage Notes (Signed)
Pt arrived via ems.  Pt says had a bm last night and started having lower back pain.  Reports woke up at 6am and was having some abd cramping.  Reports vomited x 1 and has been having dry heaves.  Pt says recently changed her diet and says eats big macs every day.

## 2020-01-15 ENCOUNTER — Ambulatory Visit (INDEPENDENT_AMBULATORY_CARE_PROVIDER_SITE_OTHER): Payer: Medicaid Other | Admitting: Family Medicine

## 2020-01-29 ENCOUNTER — Ambulatory Visit (INDEPENDENT_AMBULATORY_CARE_PROVIDER_SITE_OTHER): Payer: Medicaid Other | Admitting: Primary Care

## 2020-03-19 ENCOUNTER — Other Ambulatory Visit (INDEPENDENT_AMBULATORY_CARE_PROVIDER_SITE_OTHER): Payer: Self-pay | Admitting: Primary Care

## 2020-03-19 ENCOUNTER — Other Ambulatory Visit (HOSPITAL_COMMUNITY): Payer: Self-pay | Admitting: Cardiology

## 2020-03-19 ENCOUNTER — Other Ambulatory Visit (HOSPITAL_COMMUNITY): Payer: Self-pay | Admitting: Cardiovascular Disease

## 2020-03-19 NOTE — Telephone Encounter (Signed)
Requested  medications are  due for refill today yes  Requested medications are on the active medication list yes  Last refill 12/15  Last visit 10/2019  Future visit scheduled no  Notes to clinic Not Delegated

## 2020-03-20 NOTE — Telephone Encounter (Signed)
Received request for warfarin refill.  Pt has not had INR checked here since nov 2020.  Called pt.  She verified she has not had INR checked anywhere. Explained importance of having regular INR checks esp with MVR,  She verbalized understanding.  Warfarin RX sent to pharmacy.  INR appt made.

## 2020-03-20 NOTE — Telephone Encounter (Signed)
Requested  medications are  due for refill today yes  Requested medications are on the active medication list yes  Last refill 12/15  Last visit 10/2019  Future visit scheduled no  Notes to clinic Not Delegated  

## 2020-04-07 ENCOUNTER — Encounter: Payer: Self-pay | Admitting: Thoracic Surgery (Cardiothoracic Vascular Surgery)

## 2020-04-08 ENCOUNTER — Encounter: Payer: Self-pay | Admitting: Thoracic Surgery (Cardiothoracic Vascular Surgery)

## 2020-04-09 ENCOUNTER — Other Ambulatory Visit (INDEPENDENT_AMBULATORY_CARE_PROVIDER_SITE_OTHER): Payer: Self-pay | Admitting: Primary Care

## 2020-04-09 NOTE — Telephone Encounter (Signed)
Requested  medications are  due for refill today yes  Requested medications are on the active medication list yes  Last refill 4/2  Last visit 10/31/19  Future visit scheduled NO  Notes to clinic Not Delegated.

## 2020-04-24 ENCOUNTER — Telehealth: Payer: Self-pay

## 2020-04-24 NOTE — Telephone Encounter (Signed)
Unable to lmom for overdue inr 

## 2020-05-09 ENCOUNTER — Other Ambulatory Visit (INDEPENDENT_AMBULATORY_CARE_PROVIDER_SITE_OTHER): Payer: Self-pay | Admitting: Primary Care

## 2020-05-09 NOTE — Telephone Encounter (Signed)
Requested medication (s) are due for refill today: yes  Requested medication (s) are on the active medication list: yes  Last refill: 10/31/19  #30 0 refills  Future visit scheduled: No  Notes to clinic:  Not delegated    Requested Prescriptions  Pending Prescriptions Disp Refills   QUEtiapine (SEROQUEL) 50 MG tablet [Pharmacy Med Name: QUEtiapine Fumarate 50 MG Oral Tablet] 30 tablet 0    Sig: TAKE 1 TABLET BY MOUTH AT BEDTIME      Not Delegated - Psychiatry:  Antipsychotics - Second Generation (Atypical) - quetiapine Failed - 05/09/2020  6:45 PM      Failed - This refill cannot be delegated      Failed - ALT in normal range and within 180 days    ALT  Date Value Ref Range Status  04/03/2019 16 0 - 44 U/L Final          Failed - AST in normal range and within 180 days    AST  Date Value Ref Range Status  04/03/2019 17 15 - 41 U/L Final          Failed - Valid encounter within last 6 months    Recent Outpatient Visits           6 months ago Moderate episode of recurrent major depressive disorder (HCC)   [facility] Grayce Sessions, NP   1 year ago Encounter to establish care   [facility] Grayce Sessions, NP              Passed - Completed PHQ-2 or PHQ-9 in the last 360 days.      Passed - Last BP in normal range    BP Readings from Last 1 Encounters:  01/10/20 107/77

## 2020-06-17 ENCOUNTER — Telehealth: Payer: Self-pay

## 2020-06-17 NOTE — Telephone Encounter (Signed)
Unable to lmom for overdue inr 

## 2020-07-14 ENCOUNTER — Other Ambulatory Visit (HOSPITAL_COMMUNITY): Payer: Self-pay | Admitting: Adult Health

## 2020-08-09 ENCOUNTER — Other Ambulatory Visit: Payer: Self-pay

## 2020-08-09 ENCOUNTER — Emergency Department (HOSPITAL_COMMUNITY): Payer: Medicaid Other

## 2020-08-09 ENCOUNTER — Encounter (HOSPITAL_COMMUNITY): Payer: Self-pay | Admitting: *Deleted

## 2020-08-09 ENCOUNTER — Emergency Department (HOSPITAL_COMMUNITY)
Admission: EM | Admit: 2020-08-09 | Discharge: 2020-08-10 | Disposition: A | Discharge status: Home / Self Care | Payer: Medicaid Other | Attending: Emergency Medicine | Admitting: Emergency Medicine

## 2020-08-09 DIAGNOSIS — Z79899 Other long term (current) drug therapy: Secondary | ICD-10-CM | POA: Insufficient documentation

## 2020-08-09 DIAGNOSIS — I110 Hypertensive heart disease with heart failure: Secondary | ICD-10-CM | POA: Insufficient documentation

## 2020-08-09 DIAGNOSIS — F419 Anxiety disorder, unspecified: Secondary | ICD-10-CM

## 2020-08-09 DIAGNOSIS — Z7982 Long term (current) use of aspirin: Secondary | ICD-10-CM | POA: Insufficient documentation

## 2020-08-09 DIAGNOSIS — F17210 Nicotine dependence, cigarettes, uncomplicated: Secondary | ICD-10-CM | POA: Insufficient documentation

## 2020-08-09 DIAGNOSIS — Z7901 Long term (current) use of anticoagulants: Secondary | ICD-10-CM | POA: Insufficient documentation

## 2020-08-09 DIAGNOSIS — Z954 Presence of other heart-valve replacement: Secondary | ICD-10-CM | POA: Insufficient documentation

## 2020-08-09 DIAGNOSIS — I5033 Acute on chronic diastolic (congestive) heart failure: Secondary | ICD-10-CM | POA: Insufficient documentation

## 2020-08-09 NOTE — ED Notes (Signed)
Pt with SI,  States she is tired.  Pt shook head yes when asked about a plan to carry out with the SI but did not go into detail.

## 2020-08-09 NOTE — ED Triage Notes (Signed)
Pt with mid CP for 2 hours.  Pt with hx of heart surgery.  Pt states feels like someone sit on her chest.

## 2020-08-09 NOTE — ED Provider Notes (Signed)
[facility] EMERGENCY DEPARTMENT Provider Note   CSN: [id number] Arrival date & time: 08/09/20  2239     History Chief Complaint  Patient presents with  . Chest Pain    Theresa Crawford is a 39 y.o. female.  Patient presents to the emergency department with complaints of central chest pain that has been going on for a couple of hours.  Patient is crying and seems to be having difficulty concentrating and answering questions.  She does report that she is short of breath.  She has a history of heart surgery and congestive heart failure.  In addition, patient does endorse at least passive suicidal ideation.  She will not elaborate.        Past Medical History:  Diagnosis Date  . Anxiety   . Arthritis    "lower back" (04/04/2018)  . Bipolar disorder (HCC)   . Chronic bronchitis (HCC)   . Chronic diastolic CHF (congestive heart failure) (HCC)   . Chronic lower back pain   . DDD (degenerative disc disease), lumbar   . Depression   . Dyspnea   . GERD (gastroesophageal reflux disease)   . Headache    "weekly" (04/04/2018), miragrains in past (04/03/2019)  . Heart murmur   . Hypertension   . Mitral valve disease   . Normocytic anemia   . Obesity   . Palpitations   . Pericarditis   . Pneumonia   . Premature atrial contractions   . Pulmonary hypertension (HCC)   . PVC's (premature ventricular contractions)    a. h/o palpitations with event monitor in 03/2017 showing NSR with PACs/PVCs.  . Recurrent mitral valve stenosis and regurgitation s/p mitral valve repair   . S/P mitral valve repair 03/27/2013   Dr. Darcus Austin - [place], [place] - complex valvuloplasty including resuspension of entire posterior leaflet using Gore-tex neochords and 30 mm Edwards Physio ring annuloplasty  . S/P redo mitral valve replacement with mechanical valve 04/05/2019   29 mm Sorin Carbomedics Optiform bileaflet mechanical valve  . S/P tricuspid valve repair 04/05/2019   28 mm Edwards mc3 ring  annuloplasty  . Sarcoidosis   . Tobacco abuse   . Tricuspid regurgitation     Patient Active Problem List   Diagnosis Date Noted  . S/P redo mitral valve replacement with mechanical valve 04/05/2019  . S/P tricuspid valve repair 04/05/2019  . Obesity (BMI 30-39.9) 03/29/2019  . Sarcoidosis   . Acute on chronic diastolic congestive heart failure (HCC) 03/24/2019  . Normocytic anemia   . Dysuria 03/19/2019  . Acute CHF (congestive heart failure) (HCC) 03/04/2019  . MDD (major depressive disorder), recurrent episode (HCC) 04/05/2018  . S/P tooth extraction 04/03/2018  . Suicidal ideation 04/03/2018  . MDD (major depressive disorder)   . Chronic diastolic congestive heart failure (HCC)   . Tricuspid regurgitation   . Hypomagnesemia 03/07/2018  . Tobacco abuse   . Dyspnea 01/11/2018  . Prolonged QT interval 09/10/2017  . Normochromic normocytic anemia   . Pulmonary edema 07/05/2017  . Hypokalemia 06/12/2017  . Pulmonary hypertension (HCC) 06/12/2017  . Flash pulmonary edema (HCC) 06/11/2017  . Depression with anxiety 06/11/2017  . Recurrent mitral valve stenosis and regurgitation s/p mitral valve repair   . Acute pulmonary edema (HCC) 10/10/2016  . CAP (community acquired pneumonia) 05/25/2016  . Leukocytosis 05/24/2016  . Acute respiratory failure with hypoxia (HCC) 05/23/2016  . Acute on chronic diastolic CHF (congestive heart failure) (HCC) 05/23/2016  . Cough with hemoptysis 05/23/2016  . Postpartum  complication pericarditis in 2009 with eventual needing MVR in 2015 05/23/2016  . Anemia, iron deficiency 05/23/2016  . Hypertension   . SOB (shortness of breath)   . Respiratory distress 02/16/2016  . Essential hypertension 02/16/2016  . S/P mitral valve repair 03/27/2013    Past Surgical History:  Procedure Laterality Date  . ABDOMINAL HERNIA REPAIR  ~ 2011  . CARDIAC CATHETERIZATION  2014  . CESAREAN SECTION  2004; 2009  . HERNIA REPAIR    . MITRAL VALVE REPAIR   03/27/2013   Dr. Darcus Austin - [place], [place]. - complex valvuloplasty including resuspension of posterior leaflet with 30 mm CE Physio ring annuloplasty  . MITRAL VALVE REPLACEMENT N/A 04/05/2019   Procedure: Mitral Valve (Mv) Replacement using Carbomedics Optiform Valve size 42mm;  Surgeon: Purcell Nails, MD;  Location: [facility] OR;  Service: Open Heart Surgery;  Laterality: N/A;  . MULTIPLE EXTRACTIONS WITH ALVEOLOPLASTY N/A 04/03/2018   Procedure: Extraction of tooth #30 with alveoloplasty and gross debridement of remaining teeth;  Surgeon: Charlynne Pander, DDS;  Location: [facility] OR;  Service: Oral Surgery;  Laterality: N/A;  . PARTIAL HYSTERECTOMY  2011   PID w/problem with fallopian tubes, had left fallopian and left ovary removed, pt still having periods  . RIGHT HEART CATH N/A 03/27/2019   Procedure: RIGHT HEART CATH;  Surgeon: Laurey Morale, MD;  Location: [facility] INVASIVE CV LAB;  Service: Cardiovascular;  Laterality: N/A;  . RIGHT/LEFT HEART CATH AND CORONARY ANGIOGRAPHY N/A 03/05/2019   Procedure: RIGHT/LEFT HEART CATH AND CORONARY ANGIOGRAPHY;  Surgeon: Kathleene Hazel, MD;  Location: [facility] INVASIVE CV LAB;  Service: Cardiovascular;  Laterality: N/A;  . TEE WITHOUT CARDIOVERSION N/A 05/26/2016   Procedure: TRANSESOPHAGEAL ECHOCARDIOGRAM (TEE);  Surgeon: [name], MD;  Location: [facility] ENDO SUITE;  Service: Cardiovascular;  Laterality: N/A;  . TEE WITHOUT CARDIOVERSION N/A 01/25/2018   Procedure: TRANSESOPHAGEAL ECHOCARDIOGRAM (TEE) WITH PROPOFOL;  Surgeon: Jonelle Sidle, MD;  Location: [facility] ENDO SUITE;  Service: Cardiovascular;  Laterality: N/A;  . TEE WITHOUT CARDIOVERSION N/A 03/05/2019   Procedure: TRANSESOPHAGEAL ECHOCARDIOGRAM (TEE);  Surgeon: Pricilla Riffle, MD;  Location: [facility] ENDOSCOPY;  Service: Cardiovascular;  Laterality: N/A;  . TEE WITHOUT CARDIOVERSION N/A 04/05/2019   Procedure: TRANSESOPHAGEAL ECHOCARDIOGRAM (TEE);  Surgeon: Purcell Nails, MD;  Location: [facility] OR;   Service: Open Heart Surgery;  Laterality: N/A;  . TRICUSPID VALVE REPLACEMENT N/A 04/05/2019   Procedure: Tricuspid Valve Repair using Edwards MC3 Tricuspid ring size 76mm;  Surgeon: Purcell Nails, MD;  Location: [facility] OR;  Service: Open Heart Surgery;  Laterality: N/A;     OB History    Gravida  2   Para  2   Term  2   Preterm      AB      Living        SAB      TAB      Ectopic      Multiple      Live Births              Family History  Problem Relation Age of Onset  . Heart failure Father        just received LVAD  . Heart disease Paternal Grandfather        stent placement    Social History   Tobacco Use  . Smoking status: Current Every Day Smoker    Packs/day: 0.25    Years: 24.00    Pack years: 6.00  Types: Cigarettes    Start date: 11/03/1999  . Smokeless tobacco: Never Used  Vaping Use  . Vaping Use: Never used  Substance Use Topics  . Alcohol use: Not Currently    Comment: 04/04/2018 "1-2 beers/month; if that"  . Drug use: Not Currently    Home Medications Prior to Admission medications   Medication Sig Start Date End Date Taking? Authorizing Provider  acetaminophen (TYLENOL) 325 MG tablet Take 2 tablets (650 mg total) by mouth every 6 (six) hours as needed for mild pain. 04/13/19   Ardelle Balls, PA-C  albuterol (PROVENTIL HFA;VENTOLIN HFA) 108 (90 Base) MCG/ACT inhaler Inhale 2 puffs into the lungs every 6 (six) hours as needed for wheezing or shortness of breath.    [provider]  aspirin EC 81 MG EC tablet Take 1 tablet (81 mg total) by mouth daily. 04/13/19   Doree Fudge M, PA-C  budesonide (PULMICORT) 0.5 MG/2ML nebulizer solution Take 2 mLs (0.5 mg total) by nebulization 2 (two) times daily. 07/24/19 08/23/19  Icard, Elige Radon L, DO  budesonide (PULMICORT) 180 MCG/ACT inhaler Inhale 1 puff into the lungs 2 (two) times a day. 03/29/19   Rama, Maryruth Bun, MD  busPIRone (BUSPAR) 7.5 MG tablet Take 1 tablet (7.5 mg  total) by mouth 3 (three) times daily. 10/31/19   Grayce Sessions, NP  clonazePAM (KLONOPIN) 0.5 MG tablet Take 1 tablet (0.5 mg total) by mouth 2 (two) times daily. 03/08/19   Almon Hercules, MD  docusate sodium (COLACE) 100 MG capsule Take 100 mg by mouth daily as needed for moderate constipation.     [provider]  EPINEPHrine (EPIPEN 2-PAK) 0.3 mg/0.3 mL IJ SOAJ injection Inject 0.3 Units into the muscle once.     [provider]  ferrous sulfate 325 (65 FE) MG tablet Take 1 tablet (325 mg total) by mouth 2 (two) times daily with a meal. Patient taking differently: Take 325 mg by mouth daily with breakfast.  03/08/18   Philip Aspen, Limmie Patricia, MD  folic acid (FOLVITE) 800 MCG tablet Take 400 mcg by mouth daily.    [provider]  HYDROcodone-acetaminophen (NORCO/VICODIN) 5-325 MG tablet Take 1 tablet by mouth every 4 (four) hours as needed. 10/21/19   Elson Areas, PA-C  hydrOXYzine (VISTARIL) 25 MG capsule TAKE  CAPSULE BY MOUTH THREE TIMES DAILY AS NEEDED 03/20/20   Grayce Sessions, NP  LORazepam (ATIVAN) 1 MG tablet Take one every 12 hours as needed for panick attacks 08/10/20   Bethann Berkshire, MD  metoprolol succinate (TOPROL-XL) 25 MG 24 hr tablet Take 1 tablet (25 mg total) by mouth daily. 12/11/19   Laurey Morale, MD  potassium chloride SA (KLOR-CON) 20 MEQ tablet Take 1 tablet (20 mEq total) by mouth daily. 12/11/19   Laurey Morale, MD  Probiotic Product (PROBIOTIC PO) Take 1 capsule by mouth daily.    [provider]  QUEtiapine (SEROQUEL) 50 MG tablet Take 1 tablet (50 mg total) by mouth at bedtime. 10/31/19   Grayce Sessions, NP  sertraline (ZOLOFT) 100 MG tablet Take 1 tablet (100 mg total) by mouth daily. 10/31/19   Grayce Sessions, NP  spironolactone (ALDACTONE) 25 MG tablet Take 1 tablet (25 mg total) by mouth daily. 12/11/19   Laurey Morale, MD  torsemide (DEMADEX) 20 MG tablet Take 2 tablets (40 mg total) by mouth daily.  Please call for office visit for further refills [phone] 03/20/20   Laurey Morale,  MD  traZODone (DESYREL) 50 MG tablet Take 0.5 tablets (25 mg total) by mouth at bedtime. 10/31/19   Grayce Sessions, NP  vitamin B-12 (CYANOCOBALAMIN) 1000 MCG tablet Take 1,000 mcg by mouth daily.    [provider]  warfarin (COUMADIN) 5 MG tablet TAKE 1 TABLET BY MOUTH ONCE DAILY - EXCEPT ONE-HALF TABLET ON SUNDAYS OR AS DIRECTED 03/20/20   Netta Neat., NP    Allergies    Bee venom, Lisinopril, Penicillins, Perflutren lipid microsphere, Shellfish allergy, and Contrast media [iodinated diagnostic agents]  Review of Systems   Review of Systems  Respiratory: Positive for shortness of breath.   Cardiovascular: Positive for chest pain.  Psychiatric/Behavioral: Positive for suicidal ideas.  All other systems reviewed and are negative.   Physical Exam Updated Vital Signs BP (!) 147/105 (BP Location: Left Arm)   Pulse 83   Temp 97.9 F (36.6 C) (Oral)   Resp 20   Ht  (1.626 m)   Wt 111.1 kg   LMP 07/09/2020   SpO2 96%   BMI 42.05 kg/m   Physical Exam Vitals and nursing note reviewed.  Constitutional:      General: She is not in acute distress.    Appearance: Normal appearance. She is well-developed.  HENT:     Head: Normocephalic and atraumatic.     Right Ear: Hearing normal.     Left Ear: Hearing normal.     Nose: Nose normal.  Eyes:     Conjunctiva/sclera: Conjunctivae normal.     Pupils: Pupils are equal, round, and reactive to light.  Cardiovascular:     Rate and Rhythm: Regular rhythm.     Heart sounds: S1 normal and S2 normal. No murmur heard.  No friction rub. No gallop.      Comments: Mechanical valve click Pulmonary:     Effort: Pulmonary effort is normal. No respiratory distress.     Breath sounds: Normal breath sounds.  Chest:     Chest wall: No tenderness.  Abdominal:     General: Bowel sounds are normal.     Palpations: Abdomen is soft.      Tenderness: There is no abdominal tenderness. There is no guarding or rebound. Negative signs include Murphy's sign and McBurney's sign.     Hernia: No hernia is present.  Musculoskeletal:        General: Normal range of motion.     Cervical back: Normal range of motion and neck supple.  Skin:    General: Skin is warm and dry.     Findings: No rash.  Neurological:     Mental Status: She is alert and oriented to person, place, and time.     GCS: GCS eye subscore is 4. GCS verbal subscore is 5. GCS motor subscore is 6.     Cranial Nerves: No cranial nerve deficit.     Sensory: No sensory deficit.     Coordination: Coordination normal.  Psychiatric:        Mood and Affect: Mood is anxious. Affect is tearful.        Speech: Speech normal.        Behavior: Behavior normal.        Thought Content: Thought content normal.     ED Results / Procedures / Treatments   Labs (all labs ordered are listed, but only abnormal results are displayed) Labs Reviewed  CBC WITH DIFFERENTIAL/PLATELET - Abnormal; Notable for the following components:      Result  Value   WBC 11.4 (*)    Hemoglobin 10.5 (*)    HCT 33.4 (*)    RDW 16.5 (*)    Neutro Abs 8.1 (*)    All other components within normal limits  BRAIN NATRIURETIC PEPTIDE - Abnormal; Notable for the following components:   B Natriuretic Peptide 265.0 (*)    All other components within normal limits  COMPREHENSIVE METABOLIC PANEL - Abnormal; Notable for the following components:   Creatinine, Ser 1.09 (*)    Calcium 8.7 (*)    All other components within normal limits  RAPID URINE DRUG SCREEN, HOSP PERFORMED - Abnormal; Notable for the following components:   Cocaine POSITIVE (*)    Benzodiazepines POSITIVE (*)    All other components within normal limits  D-DIMER, QUANTITATIVE (NOT AT [facility]) - Abnormal; Notable for the following components:   D-Dimer, Quant 2.40 (*)    All other components within normal limits  TROPONIN I (HIGH  SENSITIVITY) - Abnormal; Notable for the following components:   Troponin I (High Sensitivity) 183 (*)    All other components within normal limits  TROPONIN I (HIGH SENSITIVITY) - Abnormal; Notable for the following components:   Troponin I (High Sensitivity) 119 (*)    All other components within normal limits  TROPONIN I (HIGH SENSITIVITY) - Abnormal; Notable for the following components:   Troponin I (High Sensitivity) 118 (*)    All other components within normal limits  ETHANOL  PROTIME-INR  I-STAT BETA HCG BLOOD, ED ([facility], [facility], [facility] ONLY)    EKG EKG Interpretation  Date/Time:  Saturday August 09 2020 23:03:58 EST Ventricular Rate:  86 PR Interval:    QRS Duration: 103 QT Interval:  434 QTC Calculation: 520 R Axis:   62 Text Interpretation: Sinus or ectopic atrial rhythm Short PR interval Nonspecific ST abnormality Prolonged QT interval No significant change since last tracing Confirmed by Gilda Crease [phone]) on 08/10/2020 1:50:19 AM   Radiology No results found.  Procedures Procedures (including critical care time)  Medications Ordered in ED Medications  LORazepam (ATIVAN) injection 1 mg (1 mg Intravenous Given 08/10/20 0055)  hydrocortisone sodium succinate (SOLU-CORTEF) injection 200 mg (200 mg Intravenous Given 08/10/20 0803)  diphenhydrAMINE (BENADRYL) capsule 50 mg ( Oral See Alternative 08/10/20 1130)    Or  diphenhydrAMINE (BENADRYL) injection 50 mg (50 mg Intravenous Given 08/10/20 1130)  iohexol (OMNIPAQUE) 350 MG/ML injection 75 mL (75 mLs Intravenous Contrast Given 08/10/20 1219)    ED Course  I have reviewed the triage vital signs and the nursing notes.  Pertinent labs & imaging results that were available during my care of the patient were reviewed by me and considered in my medical decision making (see chart for details).    MDM Rules/Calculators/A&P                          Patient presented to the emergency department for evaluation  of chest pain.  Patient reports onset of pain 2 hours before coming to the ER.  She reports feeling heaviness on her chest.  Patient is extremely anxious, tearful, agitated at arrival.  She is having difficulty focusing and answering questions.  She does endorse thoughts of suicide.  Chart was reviewed and she does have a cardiac history.  She has had previous mitral valve replacement with a mechanical valve.  She has, however, had a heart catheterization in 2020 that showed no obstructive CAD.  Patient is positive for  cocaine.  EKG with nonspecific ST changes consistent with previous EKGs, no dynamic changes.  First troponin is slightly elevated.  Second troponin is still elevated, but trending downwards.  Discussed with on-call cardiology, Dr. Brayton LaymanFernandes.  He did not feel that the patient requires further cardiac evaluation or hospitalization for cardiac work-up.  Mild elevation of troponin likely secondary to cocaine use.  Recent heart catheterization is reassuring.  A third troponin obtained. No further elevation. D-dimer is elevated. CTangio to eval for PE - signed out to oncoming ER physician with CT pending. Final Clinical Impression(s) / ED Diagnoses Final diagnoses:  Anxiety    Rx / DC Orders ED Discharge Orders         Ordered    LORazepam (ATIVAN) 1 MG tablet        08/10/20 1406           Arbadella Kimbler, Canary Brimhristopher J, MD 08/18/20 [phone]

## 2020-08-09 NOTE — ED Notes (Signed)
Patient admits to having suicidal thoughts with a plan.  She would not disclose plan to this nurse.  She states she feels "tired."  Patient appears tearful.  Patient cooperative and willing to put on hospital acquired scrubs.

## 2020-08-10 ENCOUNTER — Emergency Department (HOSPITAL_COMMUNITY): Payer: Medicaid Other

## 2020-08-10 LAB — CBC WITH DIFFERENTIAL/PLATELET
Abs Immature Granulocytes: 0.04 10*3/uL (ref 0.00–0.07)
Basophils Absolute: 0.1 10*3/uL (ref 0.0–0.1)
Basophils Relative: 0 %
Eosinophils Absolute: 0.2 10*3/uL (ref 0.0–0.5)
Eosinophils Relative: 2 %
HCT: 33.4 % — ABNORMAL LOW (ref 36.0–46.0)
Hemoglobin: 10.5 g/dL — ABNORMAL LOW (ref 12.0–15.0)
Immature Granulocytes: 0 %
Lymphocytes Relative: 20 %
Lymphs Abs: 2.3 10*3/uL (ref 0.7–4.0)
MCH: 26.9 pg (ref 26.0–34.0)
MCHC: 31.4 g/dL (ref 30.0–36.0)
MCV: 85.4 fL (ref 80.0–100.0)
Monocytes Absolute: 0.8 10*3/uL (ref 0.1–1.0)
Monocytes Relative: 7 %
Neutro Abs: 8.1 10*3/uL — ABNORMAL HIGH (ref 1.7–7.7)
Neutrophils Relative %: 71 %
Platelets: 370 10*3/uL (ref 150–400)
RBC: 3.91 MIL/uL (ref 3.87–5.11)
RDW: 16.5 % — ABNORMAL HIGH (ref 11.5–15.5)
WBC: 11.4 10*3/uL — ABNORMAL HIGH (ref 4.0–10.5)
nRBC: 0 % (ref 0.0–0.2)

## 2020-08-10 LAB — RAPID URINE DRUG SCREEN, HOSP PERFORMED
Amphetamines: NOT DETECTED
Barbiturates: NOT DETECTED
Benzodiazepines: POSITIVE — AB
Cocaine: POSITIVE — AB
Opiates: NOT DETECTED
Tetrahydrocannabinol: NOT DETECTED

## 2020-08-10 LAB — COMPREHENSIVE METABOLIC PANEL
ALT: 23 U/L (ref 0–44)
AST: 27 U/L (ref 15–41)
Albumin: 3.8 g/dL (ref 3.5–5.0)
Alkaline Phosphatase: 58 U/L (ref 38–126)
Anion gap: 6 (ref 5–15)
BUN: 18 mg/dL (ref 6–20)
CO2: 24 mmol/L (ref 22–32)
Calcium: 8.7 mg/dL — ABNORMAL LOW (ref 8.9–10.3)
Chloride: 108 mmol/L (ref 98–111)
Creatinine, Ser: 1.09 mg/dL — ABNORMAL HIGH (ref 0.44–1.00)
GFR, Estimated: >60 mL/min (ref >60)
Glucose, Bld: 83 mg/dL (ref 70–99)
Potassium: 3.9 mmol/L (ref 3.5–5.1)
Sodium: 138 mmol/L (ref 135–145)
Total Bilirubin: 0.4 mg/dL (ref 0.3–1.2)
Total Protein: 7.2 g/dL (ref 6.5–8.1)

## 2020-08-10 LAB — PROTIME-INR
INR: 1.1 (ref 0.8–1.2)
Prothrombin Time: 13.4 seconds (ref 11.4–15.2)

## 2020-08-10 LAB — I-STAT BETA HCG BLOOD, ED (MC, WL, AP ONLY): I-stat hCG, quantitative: <5 m[IU]/mL (ref <5)

## 2020-08-10 LAB — TROPONIN I (HIGH SENSITIVITY)
Troponin I (High Sensitivity): 118 ng/L (ref <18)
Troponin I (High Sensitivity): 119 ng/L (ref <18)
Troponin I (High Sensitivity): 183 ng/L (ref <18)

## 2020-08-10 LAB — ETHANOL: Alcohol, Ethyl (B): <10 mg/dL (ref <10)

## 2020-08-10 LAB — BRAIN NATRIURETIC PEPTIDE: B Natriuretic Peptide: 265 pg/mL — ABNORMAL HIGH (ref 0.0–100.0)

## 2020-08-10 LAB — D-DIMER, QUANTITATIVE: D-Dimer, Quant: 2.4 ug/mL-FEU — ABNORMAL HIGH (ref 0.00–0.50)

## 2020-08-10 MED ORDER — HYDROCORTISONE NA SUCCINATE PF 250 MG IJ SOLR
200.0000 mg | Freq: Once | INTRAMUSCULAR | Status: AC
  Administered 2020-08-10: 200 mg via INTRAVENOUS
  Filled 2020-08-10: qty 200

## 2020-08-10 MED ORDER — DIPHENHYDRAMINE HCL 25 MG PO CAPS: 50.0000 mg | ORAL_CAPSULE | Freq: Once | ORAL | Status: AC

## 2020-08-10 MED ORDER — LORAZEPAM 2 MG/ML IJ SOLN
1.0000 mg | Freq: Once | INTRAMUSCULAR | Status: AC
  Administered 2020-08-10: 1 mg via INTRAVENOUS
  Filled 2020-08-10: qty 1

## 2020-08-10 MED ORDER — HYDROMORPHONE HCL 1 MG/ML IJ SOLN: 0.5000 mg | Freq: Once | INTRAMUSCULAR | Status: DC

## 2020-08-10 MED ORDER — IOHEXOL 350 MG/ML SOLN
75.0000 mL | Freq: Once | INTRAVENOUS | Status: AC | PRN
  Administered 2020-08-10: 75 mL via INTRAVENOUS

## 2020-08-10 MED ORDER — LORAZEPAM 1 MG PO TABS: ORAL_TABLET | ORAL | 0 refills | Status: DC

## 2020-08-10 MED ORDER — DIPHENHYDRAMINE HCL 50 MG/ML IJ SOLN
50.0000 mg | Freq: Once | INTRAMUSCULAR | Status: AC
  Administered 2020-08-10: 50 mg via INTRAVENOUS
  Filled 2020-08-10: qty 1

## 2020-08-10 NOTE — Discharge Instructions (Addendum)
Follow-up with your family doctor this week for recheck 

## 2020-08-10 NOTE — ED Notes (Signed)
Patient repeatedly self removing blood pressure cuff.  Patient unwilling to wear.

## 2020-08-10 NOTE — ED Notes (Signed)
Pt ambulated around ED w/ some shortness of breath but O2 sat remained >92% on RA

## 2020-08-10 NOTE — ED Notes (Signed)
Pt gave permission to speak to husband Carollee Leitz [phone]

## 2020-08-10 NOTE — ED Provider Notes (Signed)
Patient CT angio is negative for PE.  Patient is medically stable and has been evaluated thoroughly from a cardiac standpoint and pulmonary.  Patient sats are normal.  Patient states she is not suicidal that she has panic attacks.  She is given some Ativan to help at home for panic attacks   Bethann Berkshire, MD 08/10/20 1407

## 2020-08-10 NOTE — ED Notes (Signed)
CRITICAL VALUE ALERT  Critical Value:  Troponin 118  Date & Time Notied: 08/10/20 4730  Provider Notified: Blinda Leatherwood  Orders Received/Actions taken:

## 2020-08-10 NOTE — ED Notes (Signed)
Pt back from CT

## 2020-08-10 NOTE — ED Notes (Signed)
Pt provided with [place] bus pass for transportation home.

## 2020-08-10 NOTE — ED Notes (Signed)
Date and time results received: 08/10/20 0040  Test: troponin Critical Value: 183  Name of Provider Notified: Blinda Leatherwood, MD  Orders Received? Or Actions Taken?: acknowledged

## 2020-12-31 ENCOUNTER — Other Ambulatory Visit (INDEPENDENT_AMBULATORY_CARE_PROVIDER_SITE_OTHER): Payer: Self-pay | Admitting: Primary Care

## 2020-12-31 ENCOUNTER — Other Ambulatory Visit (HOSPITAL_COMMUNITY): Payer: Self-pay | Admitting: Cardiology

## 2020-12-31 ENCOUNTER — Other Ambulatory Visit (HOSPITAL_COMMUNITY): Payer: Self-pay | Admitting: Adult Health

## 2020-12-31 NOTE — Telephone Encounter (Signed)
Requested medication (s) are due for refill today:no  Requested medication (s) are on the active medication list: yes   Last refill: 10/23/2020  Future visit scheduled:  no  Notes to clinic:  overdue for follow up  Overdue for PHQ-2 and PHQ-9   Requested Prescriptions  Pending Prescriptions Disp Refills   traZODone (DESYREL) 50 MG tablet [Pharmacy Med Name: traZODone HCl 50 MG Oral Tablet] 45 tablet 0    Sig: TAKE 1/2 (ONE-HALF) TABLET BY MOUTH AT BEDTIME      Psychiatry: Antidepressants - Serotonin Modulator Failed - 12/31/2020 10:17 AM      Failed - Completed PHQ-2 or PHQ-9 in the last 360 days      Failed - Valid encounter within last 6 months    Recent Outpatient Visits           1 year ago Moderate episode of recurrent major depressive disorder (HCC)   [facility] Grayce Sessions, NP   1 year ago Encounter to establish care   [facility] Gwinda Passe P, NP                  sertraline (ZOLOFT) 100 MG tablet [Pharmacy Med Name: Sertraline HCl 100 MG Oral Tablet] 30 tablet 0    Sig: Take 1 tablet by mouth once daily      Psychiatry:  Antidepressants - SSRI Failed - 12/31/2020 10:17 AM      Failed - Completed PHQ-2 or PHQ-9 in the last 360 days      Failed - Valid encounter within last 6 months    Recent Outpatient Visits           1 year ago Moderate episode of recurrent major depressive disorder (HCC)   [facility] Grayce Sessions, NP   1 year ago Encounter to establish care   [facility] Grayce Sessions, NP

## 2021-01-23 ENCOUNTER — Encounter (HOSPITAL_COMMUNITY): Payer: Self-pay | Admitting: Emergency Medicine

## 2021-01-23 ENCOUNTER — Emergency Department (HOSPITAL_COMMUNITY): Payer: Self-pay

## 2021-01-23 ENCOUNTER — Emergency Department (HOSPITAL_COMMUNITY)
Admission: EM | Admit: 2021-01-23 | Discharge: 2021-01-24 | Disposition: A | Discharge status: Home / Self Care | Payer: Self-pay | Attending: Emergency Medicine | Admitting: Emergency Medicine

## 2021-01-23 ENCOUNTER — Other Ambulatory Visit: Payer: Self-pay

## 2021-01-23 DIAGNOSIS — I110 Hypertensive heart disease with heart failure: Secondary | ICD-10-CM | POA: Insufficient documentation

## 2021-01-23 DIAGNOSIS — Z79899 Other long term (current) drug therapy: Secondary | ICD-10-CM | POA: Insufficient documentation

## 2021-01-23 DIAGNOSIS — F17210 Nicotine dependence, cigarettes, uncomplicated: Secondary | ICD-10-CM | POA: Insufficient documentation

## 2021-01-23 DIAGNOSIS — H05223 Edema of bilateral orbit: Secondary | ICD-10-CM | POA: Insufficient documentation

## 2021-01-23 DIAGNOSIS — Z7982 Long term (current) use of aspirin: Secondary | ICD-10-CM | POA: Insufficient documentation

## 2021-01-23 DIAGNOSIS — Z20822 Contact with and (suspected) exposure to covid-19: Secondary | ICD-10-CM | POA: Insufficient documentation

## 2021-01-23 DIAGNOSIS — I5033 Acute on chronic diastolic (congestive) heart failure: Secondary | ICD-10-CM | POA: Insufficient documentation

## 2021-01-23 DIAGNOSIS — Z7901 Long term (current) use of anticoagulants: Secondary | ICD-10-CM | POA: Insufficient documentation

## 2021-01-23 NOTE — ED Provider Notes (Signed)
[facility] Provider Note   CSN: [id number] Arrival date & time: 01/23/21  2106     History Chief Complaint  Patient presents with  . Shortness of Breath    Theresa Crawford is a 40 y.o. female.  The history is provided by the patient.  Shortness of Breath Severity:  Moderate Onset quality:  Gradual Duration:  1 day Timing:  Constant Progression:  Worsening Chronicity:  New Relieved by:  Nothing Worsened by:  Nothing Ineffective treatments:  None tried Associated symptoms: cough   Associated symptoms: no chest pain, no fever, no headaches, no vomiting and no wheezing        Past Medical History:  Diagnosis Date  . Anxiety   . Arthritis    "lower back" (04/04/2018)  . Bipolar disorder (HCC)   . Chronic bronchitis (HCC)   . Chronic diastolic CHF (congestive heart failure) (HCC)   . Chronic lower back pain   . DDD (degenerative disc disease), lumbar   . Depression   . Dyspnea   . GERD (gastroesophageal reflux disease)   . Headache    "weekly" (04/04/2018), miragrains in past (04/03/2019)  . Heart murmur   . Hypertension   . Mitral valve disease   . Normocytic anemia   . Obesity   . Palpitations   . Pericarditis   . Pneumonia   . Premature atrial contractions   . Pulmonary hypertension (HCC)   . PVC's (premature ventricular contractions)    a. h/o palpitations with event monitor in 03/2017 showing NSR with PACs/PVCs.  . Recurrent mitral valve stenosis and regurgitation s/p mitral valve repair   . S/P mitral valve repair 03/27/2013   [place] valvuloplasty including resuspension of entire posterior leaflet using Gore-tex neochords and 30 mm Edwards Physio ring annuloplasty  . S/P redo mitral valve replacement with mechanical valve 04/05/2019   29 mm Sorin Carbomedics Optiform bileaflet mechanical valve  . S/P tricuspid valve repair 04/05/2019   28 mm Edwards mc3 ring annuloplasty  . Sarcoidosis   .  Tobacco abuse   . Tricuspid regurgitation     Patient Active Problem List   Diagnosis Date Noted  . S/P redo mitral valve replacement with mechanical valve 04/05/2019  . S/P tricuspid valve repair 04/05/2019  . Obesity (BMI 30-39.9) 03/29/2019  . Sarcoidosis   . Acute on chronic diastolic congestive heart failure (HCC) 03/24/2019  . Normocytic anemia   . Dysuria 03/19/2019  . Acute CHF (congestive heart failure) (HCC) 03/04/2019  . MDD (major depressive disorder), recurrent episode (HCC) 04/05/2018  . S/P tooth extraction 04/03/2018  . Suicidal ideation 04/03/2018  . MDD (major depressive disorder)   . Chronic diastolic congestive heart failure (HCC)   . Tricuspid regurgitation   . Hypomagnesemia 03/07/2018  . Tobacco abuse   . Dyspnea 01/11/2018  . Prolonged QT interval 09/10/2017  . Normochromic normocytic anemia   . Pulmonary edema 07/05/2017  . Hypokalemia 06/12/2017  . Pulmonary hypertension (HCC) 06/12/2017  . Flash pulmonary edema (HCC) 06/11/2017  . Depression with anxiety 06/11/2017  . Recurrent mitral valve stenosis and regurgitation s/p mitral valve repair   . Acute pulmonary edema (HCC) 10/10/2016  . CAP (community acquired pneumonia) 05/25/2016  . Leukocytosis 05/24/2016  . Acute respiratory failure with hypoxia (HCC) 05/23/2016  . Acute on chronic diastolic CHF (congestive heart failure) (HCC) 05/23/2016  . Cough with hemoptysis 05/23/2016  . Postpartum complication pericarditis in 2009 with eventual needing MVR in 2015  05/23/2016  . Anemia, iron deficiency 05/23/2016  . Hypertension   . SOB (shortness of breath)   . Respiratory distress 02/16/2016  . Essential hypertension 02/16/2016  . S/P mitral valve repair 03/27/2013    Past Surgical History:  Procedure Laterality Date  . ABDOMINAL HERNIA REPAIR  ~ 2011  . CARDIAC CATHETERIZATION  2014  . CESAREAN SECTION  2004; 2009  . HERNIA REPAIR    . MITRAL VALVE REPAIR  03/27/2013   Dr. Darcus Austin -  [place], [place]. - complex valvuloplasty including resuspension of posterior leaflet with 30 mm CE Physio ring annuloplasty  . MITRAL VALVE REPLACEMENT N/A 04/05/2019   Procedure: Mitral Valve (Mv) Replacement using Carbomedics Optiform Valve size 78mm;  Surgeon: Purcell Nails, MD;  Location: [facility] OR;  Service: Open Heart Surgery;  Laterality: N/A;  . MULTIPLE EXTRACTIONS WITH ALVEOLOPLASTY N/A 04/03/2018   Procedure: Extraction of tooth #30 with alveoloplasty and gross debridement of remaining teeth;  Surgeon: Charlynne Pander, DDS;  Location: [facility] OR;  Service: Oral Surgery;  Laterality: N/A;  . PARTIAL HYSTERECTOMY  2011   PID w/problem with fallopian tubes, had left fallopian and left ovary removed, pt still having periods  . RIGHT HEART CATH N/A 03/27/2019   Procedure: RIGHT HEART CATH;  Surgeon: Laurey Morale, MD;  Location: [facility] INVASIVE CV LAB;  Service: Cardiovascular;  Laterality: N/A;  . RIGHT/LEFT HEART CATH AND CORONARY ANGIOGRAPHY N/A 03/05/2019   Procedure: RIGHT/LEFT HEART CATH AND CORONARY ANGIOGRAPHY;  Surgeon: Kathleene Hazel, MD;  Location: [facility] INVASIVE CV LAB;  Service: Cardiovascular;  Laterality: N/A;  . TEE WITHOUT CARDIOVERSION N/A 05/26/2016   Procedure: TRANSESOPHAGEAL ECHOCARDIOGRAM (TEE);  Surgeon: [name], MD;  Location: [facility] ENDO SUITE;  Service: Cardiovascular;  Laterality: N/A;  . TEE WITHOUT CARDIOVERSION N/A 01/25/2018   Procedure: TRANSESOPHAGEAL ECHOCARDIOGRAM (TEE) WITH PROPOFOL;  Surgeon: Jonelle Sidle, MD;  Location: [facility] ENDO SUITE;  Service: Cardiovascular;  Laterality: N/A;  . TEE WITHOUT CARDIOVERSION N/A 03/05/2019   Procedure: TRANSESOPHAGEAL ECHOCARDIOGRAM (TEE);  Surgeon: Pricilla Riffle, MD;  Location[facility] ENDOSCOPY;  Service: Cardiovascular;  Laterality: N/A;  . TEE WITHOUT CARDIOVERSION N/A 04/05/2019   Procedure: TRANSESOPHAGEAL ECHOCARDIOGRAM (TEE);  Surgeon: Purcell Nails, MD;  Location: [facility] OR;  Service: Open Heart Surgery;   Laterality: N/A;  . TRICUSPID VALVE REPLACEMENT N/A 04/05/2019   Procedure: Tricuspid Valve Repair using Edwards MC3 Tricuspid ring size 42mm;  Surgeon: Purcell Nails, MD;  Location: [facility] OR;  Service: Open Heart Surgery;  Laterality: N/A;     OB History    Gravida  2   Para  2   Term  2   Preterm      AB      Living        SAB      IAB      Ectopic      Multiple      Live Births              Family History  Problem Relation Age of Onset  . Heart failure Father        just received LVAD  . Heart disease Paternal Grandfather        stent placement    Social History   Tobacco Use  . Smoking status: Current Every Day Smoker    Packs/day: 0.25    Years: 24.00    Pack years: 6.00    Types: Cigarettes    Start date: 11/03/1999  .  Smokeless tobacco: Never Used  Vaping Use  . Vaping Use: Never used  Substance Use Topics  . Alcohol use: Not Currently    Comment: 04/04/2018 "1-2 beers/month; if that"  . Drug use: Not Currently    Home Medications Prior to Admission medications   Medication Sig Start Date End Date Taking? Authorizing Provider  acetaminophen (TYLENOL) 325 MG tablet Take 2 tablets (650 mg total) by mouth every 6 (six) hours as needed for mild pain. 04/13/19   Ardelle Balls, PA-C  albuterol (PROVENTIL HFA;VENTOLIN HFA) 108 (90 Base) MCG/ACT inhaler Inhale 2 puffs into the lungs every 6 (six) hours as needed for wheezing or shortness of breath.    [provider]  aspirin EC 81 MG EC tablet Take 1 tablet (81 mg total) by mouth daily. 04/13/19   Doree Fudge M, PA-C  budesonide (PULMICORT) 0.5 MG/2ML nebulizer solution Take 2 mLs (0.5 mg total) by nebulization 2 (two) times daily. 07/24/19 08/23/19  Icard, Elige Radon L, DO  budesonide (PULMICORT) 180 MCG/ACT inhaler Inhale 1 puff into the lungs 2 (two) times a day. 03/29/19   Rama, Maryruth Bun, MD  busPIRone (BUSPAR) 7.5 MG tablet Take 1 tablet (7.5 mg total) by mouth 3 (three)  times daily. 10/31/19   Grayce Sessions, NP  clonazePAM (KLONOPIN) 0.5 MG tablet Take 1 tablet (0.5 mg total) by mouth 2 (two) times daily. 03/08/19   Almon Hercules, MD  docusate sodium (COLACE) 100 MG capsule Take 100 mg by mouth daily as needed for moderate constipation.     [provider]  EPINEPHrine (EPIPEN 2-PAK) 0.3 mg/0.3 mL IJ SOAJ injection Inject 0.3 Units into the muscle once.     [provider]  ferrous sulfate 325 (65 FE) MG tablet Take 1 tablet (325 mg total) by mouth 2 (two) times daily with a meal. Patient taking differently: Take 325 mg by mouth daily with breakfast.  03/08/18   Philip Aspen, Limmie Patricia, MD  folic acid (FOLVITE) 800 MCG tablet Take 400 mcg by mouth daily.    [provider]  HYDROcodone-acetaminophen (NORCO/VICODIN) 5-325 MG tablet Take 1 tablet by mouth every 4 (four) hours as needed. 10/21/19   Elson Areas, PA-C  hydrOXYzine (VISTARIL) 25 MG capsule TAKE  CAPSULE BY MOUTH THREE TIMES DAILY AS NEEDED 03/20/20   Grayce Sessions, NP  LORazepam (ATIVAN) 1 MG tablet Take one every 12 hours as needed for panick attacks 08/10/20   Bethann Berkshire, MD  metoprolol succinate (TOPROL-XL) 25 MG 24 hr tablet Take 1 tablet (25 mg total) by mouth daily. Needs appt for further refills 01/01/21   Laurey Morale, MD  potassium chloride SA (KLOR-CON) 20 MEQ tablet Take 1 tablet (20 mEq total) by mouth daily. 12/11/19   Laurey Morale, MD  Probiotic Product (PROBIOTIC PO) Take 1 capsule by mouth daily.    [provider]  QUEtiapine (SEROQUEL) 50 MG tablet Take 1 tablet (50 mg total) by mouth at bedtime. 10/31/19   Grayce Sessions, NP  sertraline (ZOLOFT) 100 MG tablet Take 1 tablet (100 mg total) by mouth daily. 10/31/19   Grayce Sessions, NP  spironolactone (ALDACTONE) 25 MG tablet Take 1 tablet (25 mg total) by mouth daily. Needs appt for further refills 01/01/21   Laurey Morale, MD  torsemide (DEMADEX) 20 MG tablet TAKE 2  TABLETS BY MOUTH ONCE DAILY . 01/24/21   Melene Plan, DO  traZODone (DESYREL) 50 MG tablet Take 0.5 tablets (25  mg total) by mouth at bedtime. 10/31/19   Grayce Sessions, NP  vitamin B-12 (CYANOCOBALAMIN) 1000 MCG tablet Take 1,000 mcg by mouth daily.    [provider]  warfarin (COUMADIN) 5 MG tablet TAKE 1 TABLET BY MOUTH ONCE DAILY - EXCEPT ONE-HALF TABLET ON SUNDAYS OR AS DIRECTED 03/20/20   Netta Neat., NP    Allergies    Bee venom, Lisinopril, Penicillins, Perflutren lipid microsphere, Shellfish allergy, and Contrast media [iodinated diagnostic agents]  Review of Systems   Review of Systems  Constitutional: Negative for chills and fever.  HENT: Negative for congestion and rhinorrhea.   Eyes: Negative for redness and visual disturbance.  Respiratory: Positive for cough and shortness of breath. Negative for wheezing.   Cardiovascular: Negative for chest pain and palpitations.  Gastrointestinal: Negative for nausea and vomiting.  Genitourinary: Negative for dysuria and urgency.  Musculoskeletal: Negative for arthralgias and myalgias.  Skin: Negative for pallor and wound.  Neurological: Negative for dizziness and headaches.    Physical Exam Updated Vital Signs BP 128/62   Pulse 74   Temp 98.8 F (37.1 C) (Oral)   Resp 18   Ht 5\' 4"  (1.626 m)   Wt 108.9 kg   LMP 01/15/2021   SpO2 100%   BMI 41.20 kg/m   Physical Exam Vitals and nursing note reviewed.  Constitutional:      General: She is not in acute distress.    Appearance: She is well-developed. She is not diaphoretic.  HENT:     Head: Normocephalic and atraumatic.  Eyes:     Pupils: Pupils are equal, round, and reactive to light.     Comments: Mild periorbital swelling bilaterally.  Cardiovascular:     Rate and Rhythm: Normal rate and regular rhythm.     Heart sounds: No murmur heard. No friction rub. No gallop.   Pulmonary:     Effort: Pulmonary effort is normal.     Breath sounds: No  wheezing, rhonchi or rales.  Abdominal:     General: There is no distension.     Palpations: Abdomen is soft.     Tenderness: There is no abdominal tenderness.  Musculoskeletal:        General: No tenderness.     Cervical back: Normal range of motion and neck supple.     Comments: No appreciable lower extremity edema  Skin:    General: Skin is warm and dry.  Neurological:     Mental Status: She is alert and oriented to person, place, and time.  Psychiatric:        Behavior: Behavior normal.     ED Results / Procedures / Treatments   Labs (all labs ordered are listed, but only abnormal results are displayed) Labs Reviewed  CBC WITH DIFFERENTIAL/PLATELET - Abnormal; Notable for the following components:      Result Value   Hemoglobin 11.2 (*)    HCT 35.9 (*)    RDW 17.6 (*)    All other components within normal limits  BASIC METABOLIC PANEL - Abnormal; Notable for the following components:   Glucose, Bld 113 (*)    All other components within normal limits  RESP PANEL BY RT-PCR (FLU A&B, COVID) [id number]    EKG EKG Interpretation  Date/Time:  Friday Jan 23 2021 21:14:43 EDT Ventricular Rate:  81 PR Interval:  161 QRS Duration: 89 QT Interval:  396 QTC Calculation: 460 R Axis:   122 Text Interpretation: Ectopic atrial rhythm Probable lateral infarct, age  indeterminate No significant change since last tracing Confirmed by Melene Plan [phone]) on 01/23/2021 11:11:16 PM   Radiology DG Chest Port 1 View  Result Date: 01/24/2021 CLINICAL DATA:  Shortness of breath and cough EXAM: PORTABLE CHEST 1 VIEW COMPARISON:  August 09, 2020 FINDINGS: Similar enlargement of the cardiac silhouette with central vascular prominence. Prior median sternotomy with mitral valve prosthesis. Possible small right pleural effusion. Increased interstitial markings. Visualized skeletal structures are unremarkable. IMPRESSION: Cardiomegaly with central vascular prominence and increased interstitial  markings suggesting edema. Possible small right pleural effusion. Electronically Signed   By: Maudry Mayhew MD   On: 01/24/2021 00:17    Procedures Procedures   Medications Ordered in ED Medications  torsemide [facility]) tablet 40 mg (has no administration in time range)    ED Course  I have reviewed the triage vital signs and the nursing notes.  Pertinent labs & imaging results that were available during my care of the patient were reviewed by me and considered in my medical decision making (see chart for details).    MDM Rules/Calculators/A&P                          40 yo F with a significant past medical history of a mitral valve replacement and diastolic heart failure comes in with a chief complaints of facial and abdominal swelling and difficulty breathing.  She thinks is typically how she presents with her heart failure.  Has run out of her torsemide a few days ago and has not set an appointment with her cardiologist.  She also has been having a cough over the past couple days.  No fevers.  Will obtain a chest x-ray blood work.  Given a double dose of her torsemide here.  Chest x-ray and blood work consistent with mild fluid overload.  No hypoxia.  Ambulating without issue.  We will discharge the patient home.  1:38 AM:  I have discussed the diagnosis/risks/treatment options with the patient and believe the pt to be eligible for discharge home to follow-up with PCP. We also discussed returning to the ED immediately if new or worsening sx occur. We discussed the sx which are most concerning (e.g., sudden worsening pain, fever, inability to tolerate by mouth) that necessitate immediate return. Medications administered to the patient during their visit and any new prescriptions provided to the patient are listed below.  Medications given during this visit Medications  torsemide (DEMADEX) tablet 40 mg (has no administration in time range)     The patient appears reasonably screen  and/or stabilized for discharge and I doubt any other medical condition or other [facility] requiring further screening, evaluation, or treatment in the ED at this time prior to discharge.   Final Clinical Impression(s) / ED Diagnoses Final diagnoses:  Acute on chronic diastolic congestive heart failure (HCC)    Rx / DC Orders ED Discharge Orders         Ordered    torsemide (DEMADEX) 20 MG tablet        01/24/21 0122           Melene Plan, DO 01/24/21 0138

## 2021-01-23 NOTE — ED Triage Notes (Signed)
EMS called out for shortness of breath. Pt with hx of anxiety and panic attacks. Per EMS, pt's O2 sats were 100% upon their arrival. Pt also with hx of CHF. Per pt, she has been out of all of her meds (Turosamide, Zoloft, Buspar, Seroquel) for at least 2 weeks d/t transportation issues. Pt very upset in triage (crying uncontrollably).

## 2021-01-24 LAB — CBC WITH DIFFERENTIAL/PLATELET
Abs Immature Granulocytes: 0.04 10*3/uL (ref 0.00–0.07)
Basophils Absolute: 0.1 10*3/uL (ref 0.0–0.1)
Basophils Relative: 1 %
Eosinophils Absolute: 0.2 10*3/uL (ref 0.0–0.5)
Eosinophils Relative: 2 %
HCT: 35.9 % — ABNORMAL LOW (ref 36.0–46.0)
Hemoglobin: 11.2 g/dL — ABNORMAL LOW (ref 12.0–15.0)
Immature Granulocytes: 0 %
Lymphocytes Relative: 19 %
Lymphs Abs: 1.9 10*3/uL (ref 0.7–4.0)
MCH: 26.7 pg (ref 26.0–34.0)
MCHC: 31.2 g/dL (ref 30.0–36.0)
MCV: 85.7 fL (ref 80.0–100.0)
Monocytes Absolute: 0.6 10*3/uL (ref 0.1–1.0)
Monocytes Relative: 6 %
Neutro Abs: 7.3 10*3/uL (ref 1.7–7.7)
Neutrophils Relative %: 72 %
Platelets: 389 10*3/uL (ref 150–400)
RBC: 4.19 MIL/uL (ref 3.87–5.11)
RDW: 17.6 % — ABNORMAL HIGH (ref 11.5–15.5)
WBC: 10.1 10*3/uL (ref 4.0–10.5)
nRBC: 0 % (ref 0.0–0.2)

## 2021-01-24 LAB — BASIC METABOLIC PANEL
Anion gap: 8 (ref 5–15)
BUN: 13 mg/dL (ref 6–20)
CO2: 24 mmol/L (ref 22–32)
Calcium: 8.9 mg/dL (ref 8.9–10.3)
Chloride: 106 mmol/L (ref 98–111)
Creatinine, Ser: 0.8 mg/dL (ref 0.44–1.00)
GFR, Estimated: >60 mL/min (ref >60)
Glucose, Bld: 113 mg/dL — ABNORMAL HIGH (ref 70–99)
Potassium: 3.8 mmol/L (ref 3.5–5.1)
Sodium: 138 mmol/L (ref 135–145)

## 2021-01-24 LAB — RESP PANEL BY RT-PCR (FLU A&B, COVID) ARPGX2
Influenza A by PCR: NEGATIVE
Influenza B by PCR: NEGATIVE
SARS Coronavirus 2 by RT PCR: NEGATIVE

## 2021-01-24 MED ORDER — TORSEMIDE 20 MG PO TABS
ORAL_TABLET | ORAL | 0 refills | Status: DC
Start: 2021-01-24 — End: 2021-09-07

## 2021-01-24 MED ORDER — TORSEMIDE 20 MG PO TABS
40.0000 mg | ORAL_TABLET | Freq: Every day | ORAL | Status: DC
  Administered 2021-01-24: 40 mg via ORAL
  Filled 2021-01-24: qty 2

## 2021-01-24 NOTE — Discharge Instructions (Signed)
Take your diuretic as prescribed.  Please follow-up with your cardiologist.  Return for worsening difficulty breathing.

## 2021-01-24 NOTE — ED Notes (Signed)
Pt given sprite per request 

## 2021-03-04 ENCOUNTER — Encounter (HOSPITAL_COMMUNITY): Payer: Self-pay

## 2021-03-04 ENCOUNTER — Other Ambulatory Visit: Payer: Self-pay

## 2021-03-04 ENCOUNTER — Emergency Department (HOSPITAL_COMMUNITY)
Admission: EM | Admit: 2021-03-04 | Discharge: 2021-03-04 | Disposition: A | Discharge status: Home / Self Care | Payer: Medicaid Other | Attending: Emergency Medicine | Admitting: Emergency Medicine

## 2021-03-04 DIAGNOSIS — M545 Low back pain, unspecified: Secondary | ICD-10-CM | POA: Insufficient documentation

## 2021-03-04 DIAGNOSIS — I5033 Acute on chronic diastolic (congestive) heart failure: Secondary | ICD-10-CM | POA: Insufficient documentation

## 2021-03-04 DIAGNOSIS — Z79899 Other long term (current) drug therapy: Secondary | ICD-10-CM | POA: Insufficient documentation

## 2021-03-04 DIAGNOSIS — I110 Hypertensive heart disease with heart failure: Secondary | ICD-10-CM | POA: Insufficient documentation

## 2021-03-04 DIAGNOSIS — Z7901 Long term (current) use of anticoagulants: Secondary | ICD-10-CM | POA: Insufficient documentation

## 2021-03-04 DIAGNOSIS — Z7982 Long term (current) use of aspirin: Secondary | ICD-10-CM | POA: Insufficient documentation

## 2021-03-04 DIAGNOSIS — F17210 Nicotine dependence, cigarettes, uncomplicated: Secondary | ICD-10-CM | POA: Insufficient documentation

## 2021-03-04 DIAGNOSIS — Z955 Presence of coronary angioplasty implant and graft: Secondary | ICD-10-CM | POA: Insufficient documentation

## 2021-03-04 MED ORDER — HYDROCODONE-ACETAMINOPHEN 5-325 MG PO TABS
2.0000 | ORAL_TABLET | Freq: Once | ORAL | Status: AC
  Administered 2021-03-04: 18:00:00 2 via ORAL
  Filled 2021-03-04: qty 2

## 2021-03-04 MED ORDER — HYDROCODONE-ACETAMINOPHEN 5-325 MG PO TABS: 1.0000 | ORAL_TABLET | ORAL | 0 refills | Status: DC | PRN

## 2021-03-04 MED ORDER — METHOCARBAMOL 500 MG PO TABS: 500.0000 mg | ORAL_TABLET | Freq: Four times a day (QID) | ORAL | 0 refills | Status: DC

## 2021-03-04 NOTE — ED Notes (Signed)
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  Once pt got pt off the floor felt something back and got increasingly worse.  Rates pain 10/10.

## 2021-03-04 NOTE — ED Triage Notes (Signed)
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 States she has hx of degenerative disc disease.

## 2021-03-04 NOTE — Discharge Instructions (Addendum)
Return if any problems.

## 2021-03-04 NOTE — ED Provider Notes (Signed)
[facility]   CSN: [id number] Arrival date & time: 03/04/21  1512     History Chief Complaint  Patient presents with   Back Pain    Theresa [facility] is a 40 y.o. female.  The history is provided by the patient. No language interpreter was used.  Back Pain Location:  Lumbar spine Quality:  Aching Radiates to:  Does not radiate Pain severity:  Moderate Onset quality:  Gradual Timing:  Constant Progression:  Worsening Chronicity:  New Relieved by:  Nothing Worsened by:  Nothing Ineffective treatments:  None tried Associated symptoms: no fever       Past Medical History:  Diagnosis Date   Anxiety    Arthritis    "lower back" (04/04/2018)   Bipolar disorder (HCC)    Chronic bronchitis (HCC)    Chronic diastolic CHF (congestive heart failure) (HCC)    Chronic lower back pain    DDD (degenerative disc disease), lumbar    Depression    Dyspnea    GERD (gastroesophageal reflux disease)    Headache    "weekly" (04/04/2018), miragrains in past (04/03/2019)   Heart murmur    Hypertension    Mitral valve disease    Normocytic anemia    Obesity    Palpitations    Pericarditis    Pneumonia    Premature atrial contractions    Pulmonary hypertension (HCC)    PVC's (premature ventricular contractions)    a. h/o palpitations with event monitor in 03/2017 showing NSR with PACs/PVCs.   Recurrent mitral valve stenosis and regurgitation s/p mitral valve repair    S/P mitral valve repair 03/27/2013   [place] valvul[place] including resuspension of entire posterior leaflet using Gore-tex neochords and 30 mm Edwards Physio ring annuloplasty   S/P redo mitral valve replacement with mechanical valve 04/05/2019   29 mm Sorin Carbomedics Optiform bileaflet mechanical valve   S/P tricuspid valve repair 04/05/2019   28 mm Edwards mc3 ring annuloplasty   Sarcoidosis    Tobacco abuse    Tricuspid regurgitation      Patient Active Problem List   Diagnosis Date Noted   S/P redo mitral valve replacement with mechanical valve 04/05/2019   S/P tricuspid valve repair 04/05/2019   Obesity (BMI 30-39.9) 03/29/2019   Sarcoidosis    Acute on chronic diastolic congestive heart failure (HCC) 03/24/2019   Normocytic anemia    Dysuria 03/19/2019   Acute CHF (congestive heart failure) (HCC) 03/04/2019   MDD (major depressive disorder), recurrent episode (HCC) 04/05/2018   S/P tooth extraction 04/03/2018   Suicidal ideation 04/03/2018   MDD (major depressive disorder)    Chronic diastolic congestive heart failure (HCC)    Tricuspid regurgitation    Hypomagnesemia 03/07/2018   Tobacco abuse    Dyspnea 01/11/2018   Prolonged QT interval 09/10/2017   Normochromic normocytic anemia    Pulmonary edema 07/05/2017   Hypokalemia 06/12/2017   Pulmonary hypertension (HCC) 06/12/2017   Flash pulmonary edema (HCC) 06/11/2017   Depression with anxiety 06/11/2017   Recurrent mitral valve stenosis and regurgitation s/p mitral valve repair    Acute pulmonary edema (HCC) 10/10/2016   CAP (community acquired pneumonia) 05/25/2016   Leukocytosis 05/24/2016   Acute respiratory failure with hypoxia (HCC) 05/23/2016   Acute on chronic diastolic CHF (congestive heart failure) (HCC) 05/23/2016   Cough with hemoptysis 05/23/2016   Postpartum complication pericarditis in 2009 with eventual needing MVR in 2015 05/23/2016  Anemia, iron deficiency 05/23/2016   Hypertension    SOB (shortness of breath)    Respiratory distress 02/16/2016   Essential hypertension 02/16/2016   S/P mitral valve repair 03/27/2013    Past Surgical History:  Procedure Laterality Date   ABDOMINAL HERNIA REPAIR  ~ 2011   CARDIAC CATHETERIZATION  2014   CESAREAN SECTION  2004; 2009   HERNIA REPAIR     MITRAL VALVE REPAIR  03/27/2013   Dr. Darcus [place] - complex valvuloplasty including resuspension of posterior leaflet with  30 mm CE Physio ring annuloplasty   MITRAL VALVE REPLACEMENT N/A 04/05/2019   Procedure: Mitral Valve (Mv) Replacement using Carbomedics Optiform Valve size 31mm;  Surgeon: Purcell Nails, MD;  Location: [facility] OR;  Service: Open Heart Surgery;  Laterality: N/A;   MULTIPLE EXTRACTIONS WITH ALVEOLOPLASTY N/A 04/03/2018   Procedure: Extraction of tooth #30 with alveoloplasty and gross debridement of remaining teeth;  Surgeon: Charlynne Pander, DDS;  Location: [facility] OR;  Service: Oral Surgery;  Laterality: N/A;   PARTIAL HYSTERECTOMY  2011   PID w/problem with fallopian tubes, had left fallopian and left ovary removed, pt still having periods   RIGHT HEART CATH N/A 03/27/2019   Procedure: RIGHT HEART CATH;  Surgeon: Laurey Morale, MD;  Location: [facility] INVASIVE CV LAB;  Service: Cardiovascular;  Laterality: N/A;   RIGHT/LEFT HEART CATH AND CORONARY ANGIOGRAPHY N/A 03/05/2019   Procedure: RIGHT/LEFT HEART CATH AND CORONARY ANGIOGRAPHY;  Surgeon: Kathleene Hazel, MD;  Location: [facility] INVASIVE CV LAB;  Service: Cardiovascular;  Laterality: N/A;   TEE WITHOUT CARDIOVERSION N/A 05/26/2016   Procedure: TRANSESOPHAGEAL ECHOCARDIOGRAM (TEE);  Surgeon: [name], MD;  Location: [facility] ENDO SUITE;  Service: Cardiovascular;  Laterality: N/A;   TEE WITHOUT CARDIOVERSION N/A 01/25/2018   Procedure: TRANSESOPHAGEAL ECHOCARDIOGRAM (TEE) WITH PROPOFOL;  Surgeon: Jonelle Sidle, MD;  Location: [facility] ENDO SUITE;  Service: Cardiovascular;  Laterality: N/A;   TEE WITHOUT CARDIOVERSION N/A 03/05/2019   Procedure: TRANSESOPHAGEAL ECHOCARDIOGRAM (TEE);  Surgeon: Pricilla Riffle, MD;  Location: [facility] ENDOSCOPY;  Service: Cardiovascular;  Laterality: N/A;   TEE WITHOUT CARDIOVERSION N/A 04/05/2019   Procedure: TRANSESOPHAGEAL ECHOCARDIOGRAM (TEE);  Surgeon: Purcell Nails, MD;  Location: [facility] OR;  Service: Open Heart Surgery;  Laterality: N/A;   TRICUSPID VALVE REPLACEMENT N/A 04/05/2019   Procedure: Tricuspid Valve Repair using  Edwards MC3 Tricuspid ring size 60mm;  Surgeon: Purcell Nails, MD;  Location: [facility] OR;  Service: Open Heart Surgery;  Laterality: N/A;     OB History     Gravida  2   Para  2   Term  2   Preterm      AB      Living         SAB      IAB      Ectopic      Multiple      Live Births              Family History  Problem Relation Age of Onset   Heart failure Father        just received LVAD   Heart disease Paternal Grandfather        stent placement    Social History   Tobacco Use   Smoking status: Every Day    Packs/day: 0.25    Years: 24.00    Pack years: 6.00    Types: Cigarettes    Start date: 11/03/1999   Smokeless tobacco:  Never  Vaping Use   Vaping Use: Never used  Substance Use Topics   Alcohol use: Not Currently    Comment: 04/04/2018 "1-2 beers/month; if that"   Drug use: Not Currently    Home Medications Prior to Admission medications   Medication Sig Start Date End Date Taking? Authorizing Provider  acetaminophen (TYLENOL) 325 MG tablet Take 2 tablets (650 mg total) by mouth every 6 (six) hours as needed for mild pain. 04/13/19  Yes Doree Fudge M, PA-C  albuterol (PROVENTIL HFA;VENTOLIN HFA) 108 (90 Base) MCG/ACT inhaler Inhale 2 puffs into the lungs every 6 (six) hours as needed for wheezing or shortness of breath.   Yes [provider]  aspirin EC 81 MG EC tablet Take 1 tablet (81 mg total) by mouth daily. 04/13/19  Yes Joycelyn Man, Donielle M, PA-C  budesonide (PULMICORT) 180 MCG/ACT inhaler Inhale 1 puff into the lungs 2 (two) times a day. 03/29/19  Yes Rama, Maryruth Bun, MD  busPIRone (BUSPAR) 7.5 MG tablet Take 1 tablet (7.5 mg total) by mouth 3 (three) times daily. 10/31/19  Yes Grayce Sessions, NP  ferrous sulfate 325 (65 FE) MG tablet Take 1 tablet (325 mg total) by mouth 2 (two) times daily with a meal. Patient taking differently: Take 325 mg by mouth daily with breakfast. 03/08/18  Yes Philip Aspen, Limmie Patricia, MD   folic acid (FOLVITE) 800 MCG tablet Take 400 mcg by mouth daily.   Yes [provider]  HYDROcodone-acetaminophen (NORCO/VICODIN) 5-325 MG tablet Take 1 tablet by mouth every 4 (four) hours as needed for moderate pain. 03/04/21 03/04/22 Yes Cheron Schaumann K, PA-C  hydrOXYzine (VISTARIL) 25 MG capsule TAKE  CAPSULE BY MOUTH THREE TIMES DAILY AS NEEDED 03/20/20  Yes Grayce Sessions, NP  methocarbamol (ROBAXIN) 500 MG tablet Take 1 tablet (500 mg total) by mouth 4 (four) times daily. 03/04/21  Yes Cheron Schaumann K, PA-C  metoprolol succinate (TOPROL-XL) 25 MG 24 hr tablet Take 1 tablet (25 mg total) by mouth daily. Needs appt for further refills 01/01/21  Yes Laurey Morale, MD  potassium chloride SA (KLOR-CON) 20 MEQ tablet Take 1 tablet (20 mEq total) by mouth daily. 12/11/19  Yes Laurey Morale, MD  QUEtiapine (SEROQUEL) 50 MG tablet Take 1 tablet (50 mg total) by mouth at bedtime. 10/31/19  Yes Grayce Sessions, NP  sertraline (ZOLOFT) 100 MG tablet Take 1 tablet (100 mg total) by mouth daily. 10/31/19  Yes Grayce Sessions, NP  spironolactone (ALDACTONE) 25 MG tablet Take 1 tablet (25 mg total) by mouth daily. Needs appt for further refills 01/01/21  Yes Laurey Morale, MD  torsemide (DEMADEX) 20 MG tablet TAKE 2 TABLETS BY MOUTH ONCE DAILY . 01/24/21  Yes Melene Plan, DO  traZODone (DESYREL) 50 MG tablet Take 0.5 tablets (25 mg total) by mouth at bedtime. 10/31/19  Yes Grayce Sessions, NP  warfarin (COUMADIN) 5 MG tablet TAKE 1 TABLET BY MOUTH ONCE DAILY - EXCEPT ONE-HALF TABLET ON SUNDAYS OR AS DIRECTED 03/20/20  Yes Netta Neat., NP  budesonide (PULMICORT) 0.5 MG/2ML nebulizer solution Take 2 mLs (0.5 mg total) by nebulization 2 (two) times daily. 07/24/19 08/23/19  Josephine Igo, DO  docusate sodium (COLACE) 100 MG capsule Take 100 mg by mouth daily as needed for moderate constipation.     [provider]  Probiotic Product (PROBIOTIC PO) Take 1 capsule by mouth  daily.    [provider]  vitamin B-12 (CYANOCOBALAMIN)  1000 MCG tablet Take 1,000 mcg by mouth daily.    [provider]    Allergies    Bee venom, Lisinopril, Penicillins, Perflutren lipid microsphere, Shellfish allergy, and Contrast media [iodinated diagnostic agents]  Review of Systems   Review of Systems  Constitutional:  Negative for fever.  Musculoskeletal:  Positive for back pain.  All other systems reviewed and are negative.  Physical Exam Updated Vital Signs BP 105/67 (BP Location: Right Arm)   Pulse 72   Temp 98.6 F (37 C) (Oral)   Resp 18   Ht 5\' 4"  (1.626 m)   Wt 115.7 kg   LMP 02/25/2021 (Exact Date)   SpO2 100%   BMI 43.77 kg/m   Physical Exam Vitals and nursing note reviewed.  Constitutional:      Appearance: She is well-developed.  HENT:     Head: Normocephalic.  Cardiovascular:     Rate and Rhythm: Normal rate and regular rhythm.  Pulmonary:     Effort: Pulmonary effort is normal.  Abdominal:     General: There is no distension.  Musculoskeletal:        General: Normal range of motion.     Cervical back: Normal range of motion.  Neurological:     Mental Status: She is alert and oriented to person, place, and time.    ED Results / Procedures / Treatments   Labs (all labs ordered are listed, but only abnormal results are displayed) Labs Reviewed - No data to display  EKG None  Radiology No results found.  Procedures Procedures   Medications Ordered in ED Medications  HYDROcodone-acetaminophen (NORCO/VICODIN) 5-325 MG per tablet 2 tablet (2 tablets Oral Given 03/04/21 1740)    ED Course  I have reviewed the triage vital signs and the nursing notes.  Pertinent labs & imaging results that were available during my care of the patient were reviewed by me and considered in my medical decision making (see chart for details).    MDM Rules/Calculators/A&P                          MDM:  Pt given 2 hydrocodone here.  Pt  feels better.  Pt advised to follow up with primary care for recheck in 2-3 days  Final Clinical Impression(s) / ED Diagnoses Final diagnoses:  Acute low back pain without sciatica, unspecified back pain laterality    Rx / DC Orders ED Discharge Orders          Ordered    methocarbamol (ROBAXIN) 500 MG tablet  4 times daily        03/04/21 1826    HYDROcodone-acetaminophen (NORCO/VICODIN) 5-325 MG tablet  Every 4 hours PRN        03/04/21 1826             03/06/21 03/04/21 2242    [id number], MD 03/13/21 2340

## 2021-04-29 ENCOUNTER — Telehealth: Payer: Self-pay | Admitting: *Deleted

## 2021-04-29 NOTE — Telephone Encounter (Signed)
04/29/21  Tried to reach pt to see if pt is still taking warfarin and if she's getting it checked somewhere else. Past due for INR appt.  Attempt unsuccessful.

## 2021-06-15 ENCOUNTER — Ambulatory Visit: Payer: Self-pay | Admitting: *Deleted

## 2021-07-14 ENCOUNTER — Other Ambulatory Visit: Payer: Self-pay | Admitting: Cardiology

## 2021-07-14 ENCOUNTER — Telehealth: Payer: Self-pay | Admitting: Cardiology

## 2021-07-14 NOTE — Telephone Encounter (Signed)
Pt called with SOB, congestion, cough and some edema.  Has not been seen in 2 years I asked her to go to ER to be evaluated, I could not order meds if we have not seen in 2 years.  She understood.

## 2021-09-07 ENCOUNTER — Encounter (HOSPITAL_COMMUNITY): Payer: Self-pay | Admitting: *Deleted

## 2021-09-07 ENCOUNTER — Emergency Department (HOSPITAL_COMMUNITY)
Admission: EM | Admit: 2021-09-07 | Discharge: 2021-09-07 | Disposition: A | Discharge status: Home / Self Care | Payer: Self-pay | Attending: Emergency Medicine | Admitting: Emergency Medicine

## 2021-09-07 ENCOUNTER — Other Ambulatory Visit: Payer: Self-pay

## 2021-09-07 ENCOUNTER — Emergency Department (HOSPITAL_COMMUNITY): Payer: Self-pay

## 2021-09-07 DIAGNOSIS — Z7982 Long term (current) use of aspirin: Secondary | ICD-10-CM | POA: Insufficient documentation

## 2021-09-07 DIAGNOSIS — I5032 Chronic diastolic (congestive) heart failure: Secondary | ICD-10-CM | POA: Insufficient documentation

## 2021-09-07 DIAGNOSIS — R600 Localized edema: Secondary | ICD-10-CM | POA: Insufficient documentation

## 2021-09-07 DIAGNOSIS — Z7951 Long term (current) use of inhaled steroids: Secondary | ICD-10-CM | POA: Insufficient documentation

## 2021-09-07 DIAGNOSIS — R0602 Shortness of breath: Secondary | ICD-10-CM | POA: Insufficient documentation

## 2021-09-07 DIAGNOSIS — F17210 Nicotine dependence, cigarettes, uncomplicated: Secondary | ICD-10-CM | POA: Insufficient documentation

## 2021-09-07 DIAGNOSIS — Z79899 Other long term (current) drug therapy: Secondary | ICD-10-CM | POA: Insufficient documentation

## 2021-09-07 DIAGNOSIS — I110 Hypertensive heart disease with heart failure: Secondary | ICD-10-CM | POA: Insufficient documentation

## 2021-09-07 LAB — COMPREHENSIVE METABOLIC PANEL
ALT: 12 U/L (ref 0–44)
AST: 18 U/L (ref 15–41)
Albumin: 4 g/dL (ref 3.5–5.0)
Alkaline Phosphatase: 57 U/L (ref 38–126)
Anion gap: 9 (ref 5–15)
BUN: 11 mg/dL (ref 6–20)
CO2: 19 mmol/L — ABNORMAL LOW (ref 22–32)
Calcium: 9 mg/dL (ref 8.9–10.3)
Chloride: 107 mmol/L (ref 98–111)
Creatinine, Ser: 0.73 mg/dL (ref 0.44–1.00)
GFR, Estimated: >60 mL/min (ref >60)
Glucose, Bld: 107 mg/dL — ABNORMAL HIGH (ref 70–99)
Potassium: 4.1 mmol/L (ref 3.5–5.1)
Sodium: 135 mmol/L (ref 135–145)
Total Bilirubin: 0.4 mg/dL (ref 0.3–1.2)
Total Protein: 7.5 g/dL (ref 6.5–8.1)

## 2021-09-07 LAB — CBC
HCT: 34.2 % — ABNORMAL LOW (ref 36.0–46.0)
Hemoglobin: 10.8 g/dL — ABNORMAL LOW (ref 12.0–15.0)
MCH: 26.3 pg (ref 26.0–34.0)
MCHC: 31.6 g/dL (ref 30.0–36.0)
MCV: 83.2 fL (ref 80.0–100.0)
Platelets: 364 10*3/uL (ref 150–400)
RBC: 4.11 MIL/uL (ref 3.87–5.11)
RDW: 17.6 % — ABNORMAL HIGH (ref 11.5–15.5)
WBC: 7 10*3/uL (ref 4.0–10.5)
nRBC: 0 % (ref 0.0–0.2)

## 2021-09-07 LAB — MAGNESIUM: Magnesium: 1.9 mg/dL (ref 1.7–2.4)

## 2021-09-07 LAB — BRAIN NATRIURETIC PEPTIDE: B Natriuretic Peptide: 133 pg/mL — ABNORMAL HIGH (ref 0.0–100.0)

## 2021-09-07 LAB — TROPONIN I (HIGH SENSITIVITY): Troponin I (High Sensitivity): 22 ng/L — ABNORMAL HIGH (ref <18)

## 2021-09-07 MED ORDER — TORSEMIDE 20 MG PO TABS: ORAL_TABLET | ORAL | 0 refills | Status: DC

## 2021-09-07 MED ORDER — FUROSEMIDE 10 MG/ML IJ SOLN
80.0000 mg | Freq: Once | INTRAMUSCULAR | Status: AC
  Administered 2021-09-07: 12:00:00 40 mg via INTRAVENOUS
  Filled 2021-09-07: qty 8

## 2021-09-07 NOTE — Discharge Instructions (Signed)
Today we discussed about doing a work-up for your heart failure exacerbation.  Initially agreed to this however later changed her mind and would like to go home.  I will refill your home torsemide for you to take.  However I do not know the significance of fluid you have buildup.  He did not receive any medications or work-up in the emergency room.  You are leaving AGAINST MEDICAL ADVICE.  He will schedule follow-up appointment with your cardiologist for further management of your fluid buildup.  If your symptoms worsen please do not hesitate to return to the emergency room.

## 2021-09-07 NOTE — ED Notes (Signed)
Dr spoke with pt and pt still decided to leave AMA

## 2021-09-07 NOTE — ED Provider Notes (Signed)
[facility] EMERGENCY DEPARTMENT Provider Note   CSN: [id number] Arrival date & time: 09/07/21  [id number]     History No chief complaint on file.   Theresa Crawford is a 40 y.o. female.  40 year old female presents today for evaluation of progressively worsening dyspnea on exertion of 2-week duration.  Patient reports she ran out of her torsemide 2 weeks ago and has not had any refills.  She has not seen a cardiologist for a while.  She reports she wanted to schedule an appointment there but came into the emergency room instead.  She denies chest pain, palpitations, however endorses abdominal distention, peripheral edema.  During the exam she is tearful and states she "does not want to be here", and "does not want to be admitted" and does not want any "bad news".  She does endorse orthopnea.  Denies recent illnesses.  The history is provided by the patient. No language interpreter was used.      Past Medical History:  Diagnosis Date   Anxiety    Arthritis    "lower back" (04/04/2018)   Bipolar disorder (HCC)    Chronic bronchitis (HCC)    Chronic diastolic CHF (congestive heart failure) (HCC)    Chronic lower back pain    DDD (degenerative disc disease), lumbar    Depression    Dyspnea    GERD (gastroesophageal reflux disease)    Headache    "weekly" (04/04/2018), miragrains in past (04/03/2019)   Heart murmur    Hypertension    Mitral valve disease    Normocytic anemia    Obesity    Palpitations    Pericarditis    Pneumonia    Premature atrial contractions    Pulmonary hypertension (HCC)    PVC's (premature ventricular contractions)    a. h/o palpitations with event monitor in 03/2017 showing NSR with PACs/PVCs.   Recurrent mitral valve stenosis and regurgitation s/p mitral valve repair    S/P mitral valve repair 03/27/2013   Dr. Fransico Michael - [place], [place] - complex valvuloplasty including resuspension of entire posterior leaflet using Gore-tex neochords and 30 mm  Edwards Physio ring annuloplasty   S/P redo mitral valve replacement with mechanical valve 04/05/2019   29 mm Sorin Carbomedics Optiform bileaflet mechanical valve   S/P tricuspid valve repair 04/05/2019   28 mm Edwards mc3 ring annuloplasty   Tobacco abuse    Tricuspid regurgitation     Patient Active Problem List   Diagnosis Date Noted   S/P tricuspid valve repair 04/05/2019   Obesity (BMI 30-39.9) 03/29/2019   Sarcoidosis    Acute on chronic diastolic congestive heart failure ([place]) 03/24/2019   Normocytic anemia    Dysuria 03/19/2019   Acute CHF (congestive heart failure) ([place]) 03/04/2019   MDD (major depressive disorder), recurrent episode ([place]) 04/05/2018   S/P tooth extraction 04/03/2018   Suicidal ideation 04/03/2018   MDD (major depressive disorder)    Chronic diastolic congestive heart failure (HCC)    Tricuspid regurgitation    Hypomagnesemia 03/07/2018   Tobacco abuse    Dyspnea 01/11/2018   Prolonged QT interval 09/10/2017   Normochromic normocytic anemia    Pulmonary edema 07/05/2017   Hypokalemia 06/12/2017   Pulmonary hypertension ([place]) 06/12/2017   Flash pulmonary edema ([place]) 06/11/2017   Depression with anxiety 06/11/2017   Recurrent mitral valve stenosis and regurgitation s/p mitral valve repair    Acute pulmonary edema ([place]) 10/10/2016   CAP (community acquired pneumonia) 05/25/2016   Leukocytosis 05/24/2016   Acute  respiratory failure with hypoxia ([place]) 05/23/2016   Acute on chronic diastolic CHF (congestive heart failure) ([place]) 05/23/2016   Cough with hemoptysis 05/23/2016   Postpartum complication pericarditis in 2009 with eventual needing MVR in 2015 05/23/2016   Anemia, iron deficiency 05/23/2016   Hypertension    SOB (shortness of breath)    Respiratory distress 02/16/2016   Essential hypertension 02/16/2016   S/P mitral valve repair 03/27/2013    Past Surgical History:  Procedure Laterality Date   ABDOMINAL HERNIA REPAIR  ~ 2011   CARDIAC  CATHETERIZATION  2014   CESAREAN SECTION  2004; 2009   HERNIA REPAIR     MITRAL VALVE REPAIR  03/27/2013   Dr. Fransico Michael - [place] - complex valvuloplasty including resuspension of posterior leaflet with 30 mm CE Physio ring annuloplasty   MITRAL VALVE REPLACEMENT N/A 04/05/2019   Procedure: Mitral Valve (Mv) Replacement using Carbomedics Optiform Valve size 61mm;  Surgeon: Rexene Alberts, MD;  Location: [place];  Service: Open Heart Surgery;  Laterality: N/A;   MULTIPLE EXTRACTIONS WITH ALVEOLOPLASTY N/A 04/03/2018   Procedure: Extraction of tooth #30 with alveoloplasty and gross debridement of remaining teeth;  Surgeon: Lenn Cal, DDS;  Location: [place];  Service: Oral Surgery;  Laterality: N/A;   PARTIAL HYSTERECTOMY  2011   PID w/problem with fallopian tubes, had left fallopian and left ovary removed, pt still having periods   RIGHT HEART CATH N/A 03/27/2019   Procedure: RIGHT HEART CATH;  Surgeon: Larey Dresser, MD;  Location: [place] [facility];  Service: Cardiovascular;  Laterality: N/A;   RIGHT/LEFT HEART CATH AND CORONARY ANGIOGRAPHY N/A 03/05/2019   Procedure: RIGHT/LEFT HEART CATH AND CORONARY ANGIOGRAPHY;  Surgeon: Burnell Blanks, MD;  Location: [place] [facility];  Service: Cardiovascular;  Laterality: N/A;   TEE WITHOUT CARDIOVERSION N/A 05/26/2016   Procedure: TRANSESOPHAGEAL ECHOCARDIOGRAM (TEE);  Surgeon: Herminio Commons, MD;  [facility];  Service: Cardiovascular;  Laterality: N/A;   TEE WITHOUT CARDIOVERSION N/A 01/25/2018   Procedure: TRANSESOPHAGEAL ECHOCARDIOGRAM (TEE) WITH PROPOFOL;  Surgeon: Satira Sark, MD;  [facility];  Service: Cardiovascular;  Laterality: N/A;   TEE WITHOUT CARDIOVERSION N/A 03/05/2019   Procedure: TRANSESOPHAGEAL ECHOCARDIOGRAM (TEE);  Surgeon: Fay Records, MD;  Location: [place];  Service: Cardiovascular;  Laterality: N/A;   TEE WITHOUT CARDIOVERSION N/A 04/05/2019   Procedure:  TRANSESOPHAGEAL ECHOCARDIOGRAM (TEE);  Surgeon: Rexene Alberts, MD;  Location: [place];  Service: Open Heart Surgery;  Laterality: N/A;   TRICUSPID VALVE REPLACEMENT N/A 04/05/2019   Procedure: Tricuspid Valve Repair using Edwards MC3 Tricuspid ring size 83mm;  Surgeon: Rexene Alberts, MD;  Location: [place];  Service: Open Heart Surgery;  Laterality: N/A;     OB History     Gravida  2   Para  2   Term  2   Preterm      AB      Living         SAB      IAB      Ectopic      Multiple      Live Births              Family History  Problem Relation Age of Onset   Heart failure Father        just received LVAD   Heart disease Paternal Grandfather        stent placement    Social History  Tobacco Use   Smoking status: Every Day    Packs/day: 0.50    Years: 24.00    Pack years: 12.00    Types: Cigarettes    Start date: 11/03/1999   Smokeless tobacco: Never  Vaping Use   Vaping Use: Never used  Substance Use Topics   Alcohol use: Yes   Drug use: Yes    Types: Marijuana    Home Medications Prior to Admission medications   Medication Sig Start Date End Date Taking? Authorizing Provider  acetaminophen (TYLENOL) 325 MG tablet Take 2 tablets (650 mg total) by mouth every 6 (six) hours as needed for mild pain. 04/13/19   Ardelle Balls, PA-C  albuterol (PROVENTIL HFA;VENTOLIN HFA) 108 (90 Base) MCG/ACT inhaler Inhale 2 puffs into the lungs every 6 (six) hours as needed for wheezing or shortness of breath.    [provider]  aspirin EC 81 MG EC tablet Take 1 tablet (81 mg total) by mouth daily. 04/13/19   Doree Fudge M, PA-C  budesonide (PULMICORT) 0.5 MG/2ML nebulizer solution Take 2 mLs (0.5 mg total) by nebulization 2 (two) times daily. 07/24/19 08/23/19  Icard, Elige Radon L, DO  budesonide (PULMICORT) 180 MCG/ACT inhaler Inhale 1 puff into the lungs 2 (two) times a day. 03/29/19   Rama, Maryruth Bun, MD  busPIRone (BUSPAR) 7.5 MG tablet Take  1 tablet (7.5 mg total) by mouth 3 (three) times daily. 10/31/19   Grayce Sessions, NP  docusate sodium (COLACE) 100 MG capsule Take 100 mg by mouth daily as needed for moderate constipation.     [provider]  ferrous sulfate 325 (65 FE) MG tablet Take 1 tablet (325 mg total) by mouth 2 (two) times daily with a meal. Patient taking differently: Take 325 mg by mouth daily with breakfast. 03/08/18   Philip Aspen, Limmie Patricia, MD  folic acid (FOLVITE) 800 MCG tablet Take 400 mcg by mouth daily.    [provider]  HYDROcodone-acetaminophen (NORCO/VICODIN) 5-325 MG tablet Take 1 tablet by mouth every 4 (four) hours as needed for moderate pain. 03/04/21 03/04/22  Elson Areas, PA-C  hydrOXYzine (VISTARIL) 25 MG capsule TAKE  CAPSULE BY MOUTH THREE TIMES DAILY AS NEEDED 03/20/20   Grayce Sessions, NP  methocarbamol (ROBAXIN) 500 MG tablet Take 1 tablet (500 mg total) by mouth 4 (four) times daily. 03/04/21   Elson Areas, PA-C  metoprolol succinate (TOPROL-XL) 25 MG 24 hr tablet Take 1 tablet (25 mg total) by mouth daily. Needs appt for further refills 01/01/21   Laurey Morale, MD  potassium chloride SA (KLOR-CON) 20 MEQ tablet Take 1 tablet (20 mEq total) by mouth daily. 12/11/19   Laurey Morale, MD  Probiotic Product (PROBIOTIC PO) Take 1 capsule by mouth daily.    [provider]  QUEtiapine (SEROQUEL) 50 MG tablet Take 1 tablet (50 mg total) by mouth at bedtime. 10/31/19   Grayce Sessions, NP  sertraline (ZOLOFT) 100 MG tablet Take 1 tablet (100 mg total) by mouth daily. 10/31/19   Grayce Sessions, NP  spironolactone (ALDACTONE) 25 MG tablet Take 1 tablet (25 mg total) by mouth daily. Needs appt for further refills 01/01/21   Laurey Morale, MD  torsemide (DEMADEX) 20 MG tablet TAKE 2 TABLETS BY MOUTH ONCE DAILY . 09/07/21   Marita Kansas, PA-C  traZODone (DESYREL) 50 MG tablet Take 0.5 tablets (25 mg total) by mouth at bedtime. 10/31/19   Grayce Sessions, NP  vitamin B-12 (CYANOCOBALAMIN) 1000 MCG tablet Take 1,000 mcg by mouth daily.    [provider]  warfarin (COUMADIN) 5 MG tablet TAKE 1 TABLET BY MOUTH ONCE DAILY - EXCEPT ONE-HALF TABLET ON SUNDAYS OR AS DIRECTED 03/20/20   Verta Ellen., NP    Allergies    Bee venom, Lisinopril, Penicillins, Perflutren lipid microsphere, Shellfish allergy, and Contrast media [iodinated contrast media]  Review of Systems   Review of Systems  Constitutional:  Negative for chills and fever.  Respiratory:  Positive for shortness of breath.   Cardiovascular:  Positive for leg swelling. Negative for chest pain and palpitations.  Gastrointestinal:  Positive for abdominal distention. Negative for abdominal pain, nausea and vomiting.  Neurological:  Negative for weakness and light-headedness.  All other systems reviewed and are negative.  Physical Exam Updated Vital Signs BP 112/65    Pulse 73    Temp 97.8 F (36.6 C) (Oral)    Resp (!) 22    Ht 5\' 4"  (1.626 m)    Wt 108.9 kg    LMP 08/27/2021    SpO2 98%    BMI 41.20 kg/m   Physical Exam Vitals and nursing note reviewed.  Constitutional:      General: She is not in acute distress.    Appearance: Normal appearance. She is not ill-appearing.  HENT:     Head: Normocephalic and atraumatic.     Nose: Nose normal.  Eyes:     General: No scleral icterus.    Extraocular Movements: Extraocular movements intact.     Conjunctiva/sclera: Conjunctivae normal.  Cardiovascular:     Rate and Rhythm: Normal rate and regular rhythm.     Pulses: Normal pulses.     Heart sounds: Normal heart sounds.  Pulmonary:     Effort: Pulmonary effort is normal. No respiratory distress.     Breath sounds: No wheezing.  Abdominal:     General: There is no distension.     Palpations: Abdomen is soft.     Tenderness: There is no abdominal tenderness.  Musculoskeletal:        General: Normal range of motion.     Cervical back: Normal range of motion.      Right lower leg: Edema (Trace pitting edema) present.     Left lower leg: Edema (Trace pitting edema) present.  Skin:    General: Skin is warm and dry.  Neurological:     General: No focal deficit present.     Mental Status: She is alert. Mental status is at baseline.    ED Results / Procedures / Treatments   Labs (all labs ordered are listed, but only abnormal results are displayed) Labs Reviewed  RESP PANEL BY RT-PCR (FLU A&B, COVID) ARPGX2  CBC  COMPREHENSIVE METABOLIC PANEL  MAGNESIUM  BRAIN NATRIURETIC PEPTIDE  TROPONIN I (HIGH SENSITIVITY)    EKG None  Radiology No results found.  Procedures Procedures   Medications Ordered in ED Medications  furosemide (LASIX) injection 80 mg (has no administration in time range)    ED Course  I have reviewed the triage vital signs and the nursing notes.  Pertinent labs & imaging results that were available during my care of the patient were reviewed by me and considered in my medical decision making (see chart for details).    MDM Rules/Calculators/A&P                         After initial discussion  patient agreed to stay for ER work-up and management.  However before work-up ensued nurse stated that patient no longer wants to stay and wants to go home.  Extensive discussion had with patient regarding risk and benefit of leaving AMA without work-up or management of decompensated heart failure including worsening symptoms and/or death.  She voices understanding and is in agreement with plan.  Patient did receive 40 mg of Lasix prior to leaving and her home torsemide was refilled.  Patient is adamant she will schedule an appointment with her heart failure clinic but does not want to stay in the emergency room for further work-up.  I did discuss with her that staying for the emergency room work-up even if she does not stay for admission would allow for a better plan after she leaves the emergency room.  She voices understanding but  again refuses to stay.     Final Clinical Impression(s) / ED Diagnoses Final diagnoses:  Shortness of breath    Rx / DC Orders ED Discharge Orders          Ordered    torsemide (DEMADEX) 20 MG tablet        09/07/21 1137             Evlyn Courier, PA-C 09/07/21 1737    Daleen Bo, MD 09/07/21 1927

## 2021-09-07 NOTE — ED Triage Notes (Signed)
Pt c/o SOB, ankles, hands and abdominal swelling x 1 week. Pt hasn't taken her Torsemide in a couple of weeks because she doesn't have a doctor.

## 2021-09-07 NOTE — ED Notes (Signed)
Pt is extremely anxious and is scared Dr will find some kind of bad news again. Nurse has reassured pt that getting checked out is always best. Pt is crying and nurse is comforting.

## 2022-01-12 ENCOUNTER — Encounter (HOSPITAL_COMMUNITY): Payer: Self-pay | Admitting: Emergency Medicine

## 2022-01-12 ENCOUNTER — Other Ambulatory Visit: Payer: Self-pay

## 2022-01-12 ENCOUNTER — Inpatient Hospital Stay (HOSPITAL_COMMUNITY): Payer: Self-pay

## 2022-01-12 ENCOUNTER — Emergency Department (HOSPITAL_COMMUNITY): Payer: Self-pay

## 2022-01-12 ENCOUNTER — Inpatient Hospital Stay (HOSPITAL_COMMUNITY)
Admission: EM | Admit: 2022-01-12 | Discharge: 2022-01-17 | DRG: 291 | Disposition: A | Discharge status: Home / Self Care | Payer: Self-pay | Attending: Family Medicine | Admitting: Family Medicine

## 2022-01-12 ENCOUNTER — Other Ambulatory Visit (HOSPITAL_COMMUNITY): Payer: Medicaid Other

## 2022-01-12 DIAGNOSIS — Z8249 Family history of ischemic heart disease and other diseases of the circulatory system: Secondary | ICD-10-CM

## 2022-01-12 DIAGNOSIS — F339 Major depressive disorder, recurrent, unspecified: Secondary | ICD-10-CM | POA: Diagnosis present

## 2022-01-12 DIAGNOSIS — J811 Chronic pulmonary edema: Secondary | ICD-10-CM | POA: Diagnosis present

## 2022-01-12 DIAGNOSIS — E669 Obesity, unspecified: Secondary | ICD-10-CM | POA: Diagnosis present

## 2022-01-12 DIAGNOSIS — F419 Anxiety disorder, unspecified: Secondary | ICD-10-CM | POA: Diagnosis present

## 2022-01-12 DIAGNOSIS — Z880 Allergy status to penicillin: Secondary | ICD-10-CM

## 2022-01-12 DIAGNOSIS — Z79899 Other long term (current) drug therapy: Secondary | ICD-10-CM

## 2022-01-12 DIAGNOSIS — Z7901 Long term (current) use of anticoagulants: Secondary | ICD-10-CM

## 2022-01-12 DIAGNOSIS — Z91013 Allergy to seafood: Secondary | ICD-10-CM

## 2022-01-12 DIAGNOSIS — I5033 Acute on chronic diastolic (congestive) heart failure: Secondary | ICD-10-CM

## 2022-01-12 DIAGNOSIS — I5043 Acute on chronic combined systolic (congestive) and diastolic (congestive) heart failure: Secondary | ICD-10-CM | POA: Diagnosis present

## 2022-01-12 DIAGNOSIS — J9601 Acute respiratory failure with hypoxia: Secondary | ICD-10-CM | POA: Diagnosis present

## 2022-01-12 DIAGNOSIS — I272 Pulmonary hypertension, unspecified: Secondary | ICD-10-CM | POA: Diagnosis present

## 2022-01-12 DIAGNOSIS — Z952 Presence of prosthetic heart valve: Secondary | ICD-10-CM

## 2022-01-12 DIAGNOSIS — Z7951 Long term (current) use of inhaled steroids: Secondary | ICD-10-CM

## 2022-01-12 DIAGNOSIS — R791 Abnormal coagulation profile: Secondary | ICD-10-CM | POA: Diagnosis present

## 2022-01-12 DIAGNOSIS — K219 Gastro-esophageal reflux disease without esophagitis: Secondary | ICD-10-CM | POA: Diagnosis present

## 2022-01-12 DIAGNOSIS — Z91148 Patient's other noncompliance with medication regimen for other reason: Secondary | ICD-10-CM

## 2022-01-12 DIAGNOSIS — I4892 Unspecified atrial flutter: Secondary | ICD-10-CM | POA: Diagnosis present

## 2022-01-12 DIAGNOSIS — Z91041 Radiographic dye allergy status: Secondary | ICD-10-CM

## 2022-01-12 DIAGNOSIS — G4700 Insomnia, unspecified: Secondary | ICD-10-CM | POA: Diagnosis present

## 2022-01-12 DIAGNOSIS — Z5181 Encounter for therapeutic drug level monitoring: Secondary | ICD-10-CM

## 2022-01-12 DIAGNOSIS — I110 Hypertensive heart disease with heart failure: Principal | ICD-10-CM | POA: Diagnosis present

## 2022-01-12 DIAGNOSIS — Z7982 Long term (current) use of aspirin: Secondary | ICD-10-CM

## 2022-01-12 DIAGNOSIS — F17210 Nicotine dependence, cigarettes, uncomplicated: Secondary | ICD-10-CM | POA: Diagnosis present

## 2022-01-12 DIAGNOSIS — R000 Tachycardia, unspecified: Secondary | ICD-10-CM

## 2022-01-12 DIAGNOSIS — Z888 Allergy status to other drugs, medicaments and biological substances status: Secondary | ICD-10-CM

## 2022-01-12 DIAGNOSIS — Z91030 Bee allergy status: Secondary | ICD-10-CM

## 2022-01-12 DIAGNOSIS — Z28310 Unvaccinated for covid-19: Secondary | ICD-10-CM

## 2022-01-12 DIAGNOSIS — Z720 Tobacco use: Secondary | ICD-10-CM | POA: Diagnosis present

## 2022-01-12 DIAGNOSIS — I4891 Unspecified atrial fibrillation: Secondary | ICD-10-CM | POA: Diagnosis present

## 2022-01-12 DIAGNOSIS — F319 Bipolar disorder, unspecified: Secondary | ICD-10-CM | POA: Diagnosis present

## 2022-01-12 DIAGNOSIS — J810 Acute pulmonary edema: Principal | ICD-10-CM

## 2022-01-12 DIAGNOSIS — I3489 Other nonrheumatic mitral valve disorders: Secondary | ICD-10-CM

## 2022-01-12 DIAGNOSIS — K5900 Constipation, unspecified: Secondary | ICD-10-CM | POA: Diagnosis present

## 2022-01-12 DIAGNOSIS — I509 Heart failure, unspecified: Secondary | ICD-10-CM

## 2022-01-12 DIAGNOSIS — F331 Major depressive disorder, recurrent, moderate: Secondary | ICD-10-CM

## 2022-01-12 DIAGNOSIS — D869 Sarcoidosis, unspecified: Secondary | ICD-10-CM | POA: Diagnosis present

## 2022-01-12 DIAGNOSIS — Z6839 Body mass index (BMI) 39.0-39.9, adult: Secondary | ICD-10-CM

## 2022-01-12 LAB — HIV ANTIBODY (ROUTINE TESTING W REFLEX): HIV Screen 4th Generation wRfx: NONREACTIVE

## 2022-01-12 LAB — TROPONIN I (HIGH SENSITIVITY)
Troponin I (High Sensitivity): 5 ng/L (ref <18)
Troponin I (High Sensitivity): 5 ng/L (ref <18)

## 2022-01-12 LAB — CBC WITH DIFFERENTIAL/PLATELET
Abs Immature Granulocytes: 0.06 10*3/uL (ref 0.00–0.07)
Basophils Absolute: 0.1 10*3/uL (ref 0.0–0.1)
Basophils Relative: 1 %
Eosinophils Absolute: 0.2 10*3/uL (ref 0.0–0.5)
Eosinophils Relative: 1 %
HCT: 34.4 % — ABNORMAL LOW (ref 36.0–46.0)
Hemoglobin: 10.8 g/dL — ABNORMAL LOW (ref 12.0–15.0)
Immature Granulocytes: 0 %
Lymphocytes Relative: 14 %
Lymphs Abs: 2.4 10*3/uL (ref 0.7–4.0)
MCH: 26 pg (ref 26.0–34.0)
MCHC: 31.4 g/dL (ref 30.0–36.0)
MCV: 82.9 fL (ref 80.0–100.0)
Monocytes Absolute: 0.9 10*3/uL (ref 0.1–1.0)
Monocytes Relative: 6 %
Neutro Abs: 13.2 10*3/uL — ABNORMAL HIGH (ref 1.7–7.7)
Neutrophils Relative %: 78 %
Platelets: 435 10*3/uL — ABNORMAL HIGH (ref 150–400)
RBC: 4.15 MIL/uL (ref 3.87–5.11)
RDW: 18.2 % — ABNORMAL HIGH (ref 11.5–15.5)
WBC: 16.9 10*3/uL — ABNORMAL HIGH (ref 4.0–10.5)
nRBC: 0.1 % (ref 0.0–0.2)

## 2022-01-12 LAB — COMPREHENSIVE METABOLIC PANEL
ALT: 32 U/L (ref 0–44)
AST: 30 U/L (ref 15–41)
Albumin: 3.9 g/dL (ref 3.5–5.0)
Alkaline Phosphatase: 69 U/L (ref 38–126)
Anion gap: 7 (ref 5–15)
BUN: 19 mg/dL (ref 6–20)
CO2: 22 mmol/L (ref 22–32)
Calcium: 8.6 mg/dL — ABNORMAL LOW (ref 8.9–10.3)
Chloride: 108 mmol/L (ref 98–111)
Creatinine, Ser: 0.91 mg/dL (ref 0.44–1.00)
GFR, Estimated: >60 mL/min (ref >60)
Glucose, Bld: 122 mg/dL — ABNORMAL HIGH (ref 70–99)
Potassium: 4.2 mmol/L (ref 3.5–5.1)
Sodium: 137 mmol/L (ref 135–145)
Total Bilirubin: 0.5 mg/dL (ref 0.3–1.2)
Total Protein: 8.2 g/dL — ABNORMAL HIGH (ref 6.5–8.1)

## 2022-01-12 LAB — HEPARIN LEVEL (UNFRACTIONATED)
Heparin Unfractionated: 0.12 IU/mL — ABNORMAL LOW (ref 0.30–0.70)
Heparin Unfractionated: 0.28 IU/mL — ABNORMAL LOW (ref 0.30–0.70)

## 2022-01-12 LAB — BRAIN NATRIURETIC PEPTIDE: B Natriuretic Peptide: 129 pg/mL — ABNORMAL HIGH (ref 0.0–100.0)

## 2022-01-12 LAB — PROTIME-INR
INR: 1.1 (ref 0.8–1.2)
Prothrombin Time: 14.2 seconds (ref 11.4–15.2)

## 2022-01-12 LAB — PROCALCITONIN: Procalcitonin: <0.1 ng/mL

## 2022-01-12 LAB — MRSA NEXT GEN BY PCR, NASAL: MRSA by PCR Next Gen: NOT DETECTED

## 2022-01-12 LAB — D-DIMER, QUANTITATIVE: D-Dimer, Quant: 1.52 ug/mL-FEU — ABNORMAL HIGH (ref 0.00–0.50)

## 2022-01-12 MED ORDER — WARFARIN - PHYSICIAN DOSING INPATIENT
Freq: Every day | Status: DC
Start: 2022-01-12 — End: 2022-01-12

## 2022-01-12 MED ORDER — OXYCODONE HCL 5 MG PO TABS: 5.0000 mg | ORAL_TABLET | ORAL | Status: DC | PRN

## 2022-01-12 MED ORDER — SPIRONOLACTONE 25 MG PO TABS
25.0000 mg | ORAL_TABLET | Freq: Every day | ORAL | Status: DC
Start: 2022-01-12 — End: 2022-01-17
  Administered 2022-01-12 – 2022-01-17 (×6): 25 mg via ORAL
  Filled 2022-01-12 (×6): qty 1

## 2022-01-12 MED ORDER — HEPARIN (PORCINE) 25000 UT/250ML-% IV SOLN
1650.0000 [IU]/h | INTRAVENOUS | Status: DC
  Administered 2022-01-12: 1600 [IU]/h via INTRAVENOUS
  Administered 2022-01-12: 1300 [IU]/h via INTRAVENOUS
  Administered 2022-01-13 – 2022-01-14 (×3): 1750 [IU]/h via INTRAVENOUS
  Administered 2022-01-15: 1650 [IU]/h via INTRAVENOUS
  Administered 2022-01-15: 1750 [IU]/h via INTRAVENOUS
  Administered 2022-01-16 – 2022-01-17 (×2): 1650 [IU]/h via INTRAVENOUS
  Filled 2022-01-12 (×9): qty 250

## 2022-01-12 MED ORDER — CHLORHEXIDINE GLUCONATE CLOTH 2 % EX PADS
6.0000 | MEDICATED_PAD | Freq: Every day | CUTANEOUS | Status: DC
  Administered 2022-01-13: 6 via TOPICAL

## 2022-01-12 MED ORDER — FUROSEMIDE 10 MG/ML IJ SOLN
80.0000 mg | Freq: Once | INTRAMUSCULAR | Status: AC
  Administered 2022-01-12: 80 mg via INTRAVENOUS
  Filled 2022-01-12: qty 8

## 2022-01-12 MED ORDER — METOPROLOL SUCCINATE ER 25 MG PO TB24
25.0000 mg | ORAL_TABLET | Freq: Every day | ORAL | Status: DC
  Administered 2022-01-12: 25 mg via ORAL
  Filled 2022-01-12: qty 1

## 2022-01-12 MED ORDER — MORPHINE SULFATE (PF) 2 MG/ML IV SOLN: 2.0000 mg | INTRAVENOUS | Status: DC | PRN

## 2022-01-12 MED ORDER — ACETAMINOPHEN 650 MG RE SUPP: 650.0000 mg | Freq: Four times a day (QID) | RECTAL | Status: DC | PRN

## 2022-01-12 MED ORDER — LORAZEPAM 2 MG/ML IJ SOLN
0.5000 mg | Freq: Once | INTRAMUSCULAR | Status: AC
  Administered 2022-01-12: 0.5 mg via INTRAVENOUS
  Filled 2022-01-12: qty 1

## 2022-01-12 MED ORDER — FUROSEMIDE 10 MG/ML IJ SOLN
40.0000 mg | Freq: Two times a day (BID) | INTRAMUSCULAR | Status: DC
  Administered 2022-01-12 – 2022-01-17 (×11): 40 mg via INTRAVENOUS
  Filled 2022-01-12 (×11): qty 4

## 2022-01-12 MED ORDER — BUSPIRONE HCL 5 MG PO TABS
7.5000 mg | ORAL_TABLET | Freq: Three times a day (TID) | ORAL | Status: DC
  Administered 2022-01-12 – 2022-01-17 (×17): 7.5 mg via ORAL
  Filled 2022-01-12 (×18): qty 2

## 2022-01-12 MED ORDER — ONDANSETRON HCL 4 MG PO TABS: 4.0000 mg | ORAL_TABLET | Freq: Four times a day (QID) | ORAL | Status: DC | PRN

## 2022-01-12 MED ORDER — CHLORHEXIDINE GLUCONATE CLOTH 2 % EX PADS
6.0000 | MEDICATED_PAD | Freq: Every day | CUTANEOUS | Status: DC
  Administered 2022-01-12: 6 via TOPICAL

## 2022-01-12 MED ORDER — WARFARIN SODIUM 7.5 MG PO TABS
7.5000 mg | ORAL_TABLET | Freq: Every day | ORAL | Status: DC
  Administered 2022-01-12 – 2022-01-13 (×2): 7.5 mg via ORAL
  Filled 2022-01-12 (×2): qty 1

## 2022-01-12 MED ORDER — HYDROXYZINE PAMOATE 25 MG PO CAPS
50.0000 mg | ORAL_CAPSULE | Freq: Three times a day (TID) | ORAL | Status: DC | PRN
  Administered 2022-01-12 – 2022-01-13 (×3): 50 mg via ORAL
  Filled 2022-01-12: qty 2
  Filled 2022-01-12: qty 1
  Filled 2022-01-12 (×2): qty 2

## 2022-01-12 MED ORDER — BUDESONIDE 0.25 MG/2ML IN SUSP
0.2500 mg | Freq: Two times a day (BID) | RESPIRATORY_TRACT | Status: DC
  Administered 2022-01-12 – 2022-01-17 (×11): 0.25 mg via RESPIRATORY_TRACT
  Filled 2022-01-12 (×11): qty 2

## 2022-01-12 MED ORDER — DOCUSATE SODIUM 100 MG PO CAPS
100.0000 mg | ORAL_CAPSULE | Freq: Every day | ORAL | Status: DC | PRN
  Administered 2022-01-15: 100 mg via ORAL
  Filled 2022-01-12: qty 1

## 2022-01-12 MED ORDER — ACETAMINOPHEN 325 MG PO TABS: 650.0000 mg | ORAL_TABLET | Freq: Four times a day (QID) | ORAL | Status: DC | PRN

## 2022-01-12 MED ORDER — QUETIAPINE FUMARATE 25 MG PO TABS
50.0000 mg | ORAL_TABLET | Freq: Every day | ORAL | Status: DC
  Administered 2022-01-12 – 2022-01-16 (×5): 50 mg via ORAL
  Filled 2022-01-12 (×5): qty 2

## 2022-01-12 MED ORDER — HEPARIN BOLUS VIA INFUSION
2000.0000 [IU] | Freq: Once | INTRAVENOUS | Status: AC
  Administered 2022-01-12: 2000 [IU] via INTRAVENOUS
  Filled 2022-01-12: qty 2000

## 2022-01-12 MED ORDER — METOPROLOL TARTRATE 25 MG PO TABS
25.0000 mg | ORAL_TABLET | Freq: Three times a day (TID) | ORAL | Status: DC
  Administered 2022-01-12 – 2022-01-15 (×9): 25 mg via ORAL
  Filled 2022-01-12 (×9): qty 1

## 2022-01-12 MED ORDER — HEPARIN BOLUS VIA INFUSION
4000.0000 [IU] | Freq: Once | INTRAVENOUS | Status: AC
  Administered 2022-01-12: 4000 [IU] via INTRAVENOUS

## 2022-01-12 MED ORDER — ONDANSETRON HCL 4 MG/2ML IJ SOLN: 4.0000 mg | Freq: Four times a day (QID) | INTRAMUSCULAR | Status: DC | PRN

## 2022-01-12 MED ORDER — HEPARIN BOLUS VIA INFUSION
1200.0000 [IU] | Freq: Once | INTRAVENOUS | Status: AC
  Administered 2022-01-12: 1200 [IU] via INTRAVENOUS
  Filled 2022-01-12: qty 1200

## 2022-01-12 MED ORDER — WARFARIN SODIUM 7.5 MG PO TABS: 7.5000 mg | ORAL_TABLET | Freq: Every day | ORAL | Status: DC

## 2022-01-12 MED ORDER — SERTRALINE HCL 50 MG PO TABS
25.0000 mg | ORAL_TABLET | Freq: Every day | ORAL | Status: DC
  Administered 2022-01-12 – 2022-01-17 (×6): 25 mg via ORAL
  Filled 2022-01-12 (×6): qty 1

## 2022-01-12 MED ORDER — TECHNETIUM TO 99M ALBUMIN AGGREGATED
4.4000 | Freq: Once | INTRAVENOUS | Status: AC | PRN
  Administered 2022-01-12: 4.4 via INTRAVENOUS

## 2022-01-12 MED ORDER — ALBUTEROL SULFATE (2.5 MG/3ML) 0.083% IN NEBU: 2.5000 mg | INHALATION_SOLUTION | Freq: Four times a day (QID) | RESPIRATORY_TRACT | Status: DC | PRN

## 2022-01-12 MED ORDER — POLYETHYLENE GLYCOL 3350 17 G PO PACK
17.0000 g | PACK | Freq: Every day | ORAL | Status: DC
  Administered 2022-01-12 – 2022-01-16 (×2): 17 g via ORAL
  Filled 2022-01-12 (×4): qty 1

## 2022-01-12 MED ORDER — BUDESONIDE 180 MCG/ACT IN AEPB: 1.0000 | INHALATION_SPRAY | Freq: Two times a day (BID) | RESPIRATORY_TRACT | Status: DC

## 2022-01-12 MED ORDER — ASPIRIN EC 81 MG PO TBEC
81.0000 mg | DELAYED_RELEASE_TABLET | Freq: Every day | ORAL | Status: DC
  Administered 2022-01-12 – 2022-01-17 (×6): 81 mg via ORAL
  Filled 2022-01-12 (×6): qty 1

## 2022-01-12 MED ORDER — WARFARIN - PHARMACIST DOSING INPATIENT: Freq: Every day | Status: DC

## 2022-01-12 MED ORDER — MORPHINE SULFATE (PF) 2 MG/ML IV SOLN: 1.0000 mg | INTRAVENOUS | Status: DC | PRN

## 2022-01-12 NOTE — Progress Notes (Signed)
ANTICOAGULATION CONSULT NOTE - Follow Up Consult ? ?Pharmacy Consult for heparin and warfarin ?Indication:  MVR ? ?Allergies  ?Allergen Reactions  ? Bee Venom Anaphylaxis  ? Lisinopril Anaphylaxis, Shortness Of Breath, Swelling and Other (See Comments)  ?  Throat swells  ? Penicillins Shortness Of Breath, Swelling and Other (See Comments)  ?  Has patient had a PCN reaction causing immediate rash, facial/tongue/throat swelling, SOB or lightheadedness with hypotension: Yes ?Has patient had a PCN reaction causing severe rash involving mucus membranes or skin necrosis: Yes ?Has patient had a PCN reaction that required hospitalization No ?Has patient had a PCN reaction occurring within the last 10 years: No ?If all of the above answers are "NO", then may proceed with Cephalosporin use. ?  ? Perflutren Lipid Microsphere Shortness Of Breath and Other (See Comments)  ?  Chest Pain/Tightness, also  ? Shellfish Allergy Anaphylaxis  ? Contrast Media [Iodinated Contrast Media] Hives, Itching and Other (See Comments)  ?  Itching and hives to throat, neck, face and arms.   No difficulty breathing.   This was given at [facility] approximately 2015. Contrast Dye  ? ? ?Patient Measurements: ?Height: 5\' 4"  (162.6 cm) ?Weight: 104.9 kg (231 lb 4.2 oz) ?IBW/kg (Calculated) : 54.7 ?Heparin Dosing Weight: 80kg ? ?Vital Signs: ?Temp: 98.7 ?F (37.1 ?C) (05/02 1621) ?Temp Source: Oral (05/02 1621) ?BP: 142/100 (05/02 1800) ?Pulse Rate: 112 (05/02 1800) ? ?Labs: ?Recent Labs  ?  01/12/22 ?0235 01/12/22 ?0438 01/12/22 ?1136 01/12/22 ?1912  ?HGB 10.8*  --   --   --   ?HCT 34.4*  --   --   --   ?PLT 435*  --   --   --   ?LABPROT 14.2  --   --   --   ?INR 1.1  --   --   --   ?HEPARINUNFRC  --   --  0.12* 0.28*  ?CREATININE 0.91  --   --   --   ?TROPONINIHS 5 5  --   --   ? ? ? ?Estimated Creatinine Clearance: 95.1 mL/min (by C-G formula based on SCr of 0.91 mg/dL). ? ? ?Medical History: ?Past Medical History:  ?Diagnosis Date  ?  Anxiety   ? Arthritis   ? "lower back" (04/04/2018)  ? Bipolar disorder ([place])   ? Chronic bronchitis ([place])   ? Chronic diastolic CHF (congestive heart failure) ([place])   ? Chronic lower back pain   ? DDD (degenerative disc disease), lumbar   ? Depression   ? Dyspnea   ? GERD (gastroesophageal reflux disease)   ? Headache   ? "weekly" (04/04/2018), miragrains in past (04/03/2019)  ? Heart murmur   ? Hypertension   ? Mitral valve disease   ? Normocytic anemia   ? Obesity   ? Palpitations   ? Pericarditis   ? Pneumonia   ? Premature atrial contractions   ? Pulmonary hypertension ([place])   ? PVC's (premature ventricular contractions)   ? a. h/o palpitations with event monitor in 03/2017 showing NSR with PACs/PVCs.  ? Recurrent mitral valve stenosis and regurgitation s/p mitral valve repair   ? S/P mitral valve repair 03/27/2013  ? Dr. Fransico Michael - [place], [place] - complex valvuloplasty including resuspension of entire posterior leaflet using Gore-tex neochords and 30 mm Edwards Physio ring annuloplasty  ? S/P redo mitral valve replacement with mechanical valve 04/05/2019  ? 29 mm Sorin Carbomedics Optiform bileaflet mechanical valve  ? S/P tricuspid valve repair  04/05/2019  ? 28 mm Edwards mc3 ring annuloplasty  ? Tobacco abuse   ? Tricuspid regurgitation   ? ? ?Assessment: ?41yo female c/o SOB, admitted for acute pulmonary edema, to start heparin for MVR; of note pt is well known to be noncompliant with Coumadin, unclear if she is currently seeing a Coumadin clinic but was last seen in [facility]mad[facility] clinic in 2020 and was discharged from that clinic in 2022, no other AC notes in care everywhere, pt states she is out of Coumadin, INR at baseline. ? ?Home dose of warfarin was 5mg  daily, patient non compliant due to running out of medication. Admit INR 1.1  ? ?HL 0.28, subtherapeutic  ? ?Goal of Therapy:  ?INR goal 2.5-3.5  ?Heparin level 0.3-0.7 units/ml ?Monitor platelets by anticoagulation protocol: Yes ?  ?Plan:  ?Warfarin  7.5mg  daily  ?Give heparin 1200 units IV bolus x1  ?Increase infusion to 1750 units/hr. ?Monitor heparin levels in 6 hours and CBC daily. ? ?Assunta Curtis, RPh ?Clinical Pharmacist ? ?01/12/2022,8:51 PM ? ? ?

## 2022-01-12 NOTE — TOC Progression Note (Signed)
?  Transition of Care (TOC) Screening Note ? ? ?Patient Details  ?Name: Theresa Crawford ?Date of Birth: 10/30/1980 ? ? ?Transition of Care (TOC) CM/SW Contact:    ?Leitha Bleak, RN ?Phone Number: ?01/12/2022, 4:19 PM ? ?Multiple attempt to assess patient. Patient is is Nuclear med, TOC to follow to assess and update chart.  ? ?Transition of Care Department [facility]) has reviewed patient and no TOC needs have been identified at this time. We will continue to monitor patient advancement through interdisciplinary progression rounds. If new patient transition needs arise, please place a TOC consult. ? ? ?Barriers to Discharge: Continued Medical Work up ?

## 2022-01-12 NOTE — Progress Notes (Signed)
ANTICOAGULATION CONSULT NOTE - Initial Consult ? ?Pharmacy Consult for heparin ?Indication:  MVR ? ?Allergies  ?Allergen Reactions  ? Bee Venom Anaphylaxis  ? Lisinopril Anaphylaxis, Shortness Of Breath, Swelling and Other (See Comments)  ?  Throat swells  ? Penicillins Shortness Of Breath, Swelling and Other (See Comments)  ?  Has patient had a PCN reaction causing immediate rash, facial/tongue/throat swelling, SOB or lightheadedness with hypotension: Yes ?Has patient had a PCN reaction causing severe rash involving mucus membranes or skin necrosis: Yes ?Has patient had a PCN reaction that required hospitalization No ?Has patient had a PCN reaction occurring within the last 10 years: No ?If all of the above answers are "NO", then may proceed with Cephalosporin use. ?  ? Perflutren Lipid Microsphere Shortness Of Breath and Other (See Comments)  ?  Chest Pain/Tightness, also  ? Shellfish Allergy Anaphylaxis  ? Contrast Media [Iodinated Contrast Media] Hives, Itching and Other (See Comments)  ?  Itching and hives to throat, neck, face and arms.   No difficulty breathing.   This was given at [facility] approximately 2015. Contrast Dye  ? ? ?Patient Measurements: ?Height: 5\' 4"  (162.6 cm) ?Weight: 109 kg (240 lb 4.8 oz) ?IBW/kg (Calculated) : 54.7 ?Heparin Dosing Weight: 80kg ? ?Vital Signs: ?Temp: 97.6 ?F (36.4 ?C) (05/02 0228) ?Temp Source: Oral (05/02 0228) ?BP: 97/65 (05/02 0400) ?Pulse Rate: 116 (05/02 0400) ? ?Labs: ?Recent Labs  ?  01/12/22 ?0235  ?HGB 10.8*  ?HCT 34.4*  ?PLT 435*  ?LABPROT 14.2  ?INR 1.1  ?CREATININE 0.91  ?TROPONINIHS 5  ? ? ?Estimated Creatinine Clearance: 97.1 mL/min (by C-G formula based on SCr of 0.91 mg/dL). ? ? ?Medical History: ?Past Medical History:  ?Diagnosis Date  ? Anxiety   ? Arthritis   ? "lower back" (04/04/2018)  ? Bipolar disorder ([place])   ? Chronic bronchitis ([place])   ? Chronic diastolic CHF (congestive heart failure) ([place])   ? Chronic lower back pain   ? DDD  (degenerative disc disease), lumbar   ? Depression   ? Dyspnea   ? GERD (gastroesophageal reflux disease)   ? Headache   ? "weekly" (04/04/2018), miragrains in past (04/03/2019)  ? Heart murmur   ? Hypertension   ? Mitral valve disease   ? Normocytic anemia   ? Obesity   ? Palpitations   ? Pericarditis   ? Pneumonia   ? Premature atrial contractions   ? Pulmonary hypertension ([place])   ? PVC's (premature ventricular contractions)   ? a. h/o palpitations with event monitor in 03/2017 showing NSR with PACs/PVCs.  ? Recurrent mitral valve stenosis and regurgitation s/p mitral valve repair   ? S/P mitral valve repair 03/27/2013  ? Dr. Fransico Michael - [place], [place] - complex valvuloplasty including resuspension of entire posterior leaflet using Gore-tex neochords and 30 mm Edwards Physio ring annuloplasty  ? S/P redo mitral valve replacement with mechanical valve 04/05/2019  ? 29 mm Sorin Carbomedics Optiform bileaflet mechanical valve  ? S/P tricuspid valve repair 04/05/2019  ? 28 mm Edwards mc3 ring annuloplasty  ? Tobacco abuse   ? Tricuspid regurgitation   ? ? ?Assessment: ?41yo female c/o SOB, admitted for acute pulmonary edema, to start heparin for MVR; of note pt is well known to be noncompliant with Coumadin, unclear if she is currently seeing a Coumadin clinic but was last seen in [facility] Coumadin clinic in 2020 and was discharged from that clinic in 2022, no other AC notes in care  everywhere, pt states she is out of Coumadin, INR at baseline. ? ?Goal of Therapy:  ?Heparin level 0.3-0.7 units/ml ?Monitor platelets by anticoagulation protocol: Yes ?  ?Plan:  ?Heparin 4000 units IV bolus x1 followed by infusion at 1300 units/hr and monitor heparin levels and CBC. ? ?Wynona Neat, PharmD, BCPS  ?01/12/2022,4:30 AM ? ? ?

## 2022-01-12 NOTE — Assessment & Plan Note (Signed)
-   Patient has been out of all of her meds ?Restart lowest dose of Zoloft -patient will likely need to be titrated back up in the outpatient setting ?-Continue BuSpar for anxiety ?-Continue Atarax for anxiety ?-Continue Seroquel for insomnia ?-Continue to monitor ?

## 2022-01-12 NOTE — ED Provider Notes (Signed)
? ? EMERGENCY DEPARTMENT  ?Provider Note ? ?CSN: [id number] ?Arrival date & time: 01/12/22 0224 ? ?History ?Chief Complaint  ?Patient presents with  ? Shortness of Breath  ? ? ?Theresa Crawford is a 41 y.o. female with history of mechanical mitral valve on coumadin and CHF brought by EMS for SOB, worsening through the evening. No known cough or fever. Was tachypneic and hypoxic with EMS, improved with NRB. She has had some chest tightness.  ? ? ?Home Medications ?Prior to Admission medications   ?Medication Sig Start Date End Date Taking? Authorizing Provider  ?acetaminophen (TYLENOL) 325 MG tablet Take 2 tablets (650 mg total) by mouth every 6 (six) hours as needed for mild pain. 04/13/19   Ardelle Balls, PA-C  ?albuterol (PROVENTIL HFA;VENTOLIN HFA) 108 (90 Base) MCG/ACT inhaler Inhale 2 puffs into the lungs every 6 (six) hours as needed for wheezing or shortness of breath.    [provider]  ?aspirin EC 81 MG EC tablet Take 1 tablet (81 mg total) by mouth daily. 04/13/19   Ardelle Balls, PA-C  ?budesonide (PULMICORT) 0.5 MG/2ML nebulizer solution Take 2 mLs (0.5 mg total) by nebulization 2 (two) times daily. 07/24/19 08/23/19  Josephine Igo, DO  ?budesonide (PULMICORT) 180 MCG/ACT inhaler Inhale 1 puff into the lungs 2 (two) times a day. 03/29/19   Rama, Maryruth Bun, MD  ?busPIRone (BUSPAR) 7.5 MG tablet Take 1 tablet (7.5 mg total) by mouth 3 (three) times daily. 10/31/19   Grayce Sessions, NP  ?docusate sodium (COLACE) 100 MG capsule Take 100 mg by mouth daily as needed for moderate constipation.     [provider]  ?ferrous sulfate 325 (65 FE) MG tablet Take 1 tablet (325 mg total) by mouth 2 (two) times daily with a meal. ?Patient taking differently: Take 325 mg by mouth daily with breakfast. 03/08/18   Philip Aspen, Limmie Patricia, MD  ?folic acid (FOLVITE) 800 MCG tablet Take 400 mcg by mouth daily.    [provider]  ?HYDROcodone-acetaminophen  (NORCO/VICODIN) 5-325 MG tablet Take 1 tablet by mouth every 4 (four) hours as needed for moderate pain. 03/04/21 03/04/22  Elson Areas, PA-C  ?hydrOXYzine (VISTARIL) 25 MG capsule TAKE  CAPSULE BY MOUTH THREE TIMES DAILY AS NEEDED 03/20/20   Grayce Sessions, NP  ?methocarbamol (ROBAXIN) 500 MG tablet Take 1 tablet (500 mg total) by mouth 4 (four) times daily. 03/04/21   Elson Areas, PA-C  ?metoprolol succinate (TOPROL-XL) 25 MG 24 hr tablet Take 1 tablet (25 mg total) by mouth daily. Needs appt for further refills 01/01/21   Laurey Morale, MD  ?potassium chloride SA (KLOR-CON) 20 MEQ tablet Take 1 tablet (20 mEq total) by mouth daily. 12/11/19   Laurey Morale, MD  ?Probiotic Product (PROBIOTIC PO) Take 1 capsule by mouth daily.    [provider]  ?QUEtiapine (SEROQUEL) 50 MG tablet Take 1 tablet (50 mg total) by mouth at bedtime. 10/31/19   Grayce Sessions, NP  ?sertraline (ZOLOFT) 100 MG tablet Take 1 tablet (100 mg total) by mouth daily. 10/31/19   Grayce Sessions, NP  ?spironolactone (ALDACTONE) 25 MG tablet Take 1 tablet (25 mg total) by mouth daily. Needs appt for further refills 01/01/21   Laurey Morale, MD  ?torsemide (DEMADEX) 20 MG tablet TAKE 2 TABLETS BY MOUTH ONCE DAILY . 09/07/21   Marita Kansas, PA-C  ?traZODone (DESYREL) 50 MG tablet Take 0.5 tablets (25 mg total) by  mouth at bedtime. 10/31/19   Grayce Sessions, NP  ?vitamin B-12 (CYANOCOBALAMIN) 1000 MCG tablet Take 1,000 mcg by mouth daily.    [provider]  ?warfarin (COUMADIN) 5 MG tablet TAKE 1 TABLET BY MOUTH ONCE DAILY - EXCEPT ONE-HALF TABLET ON SUNDAYS OR AS DIRECTED 03/20/20   Netta Neat., NP  ? ? ? ?Allergies    ?Bee venom, Lisinopril, Penicillins, Perflutren lipid microsphere, Shellfish allergy, and Contrast media [iodinated contrast media] ? ? ?Review of Systems   ?Review of Systems ?Please see HPI for pertinent positives and negatives ? ?Physical Exam ?BP 102/75   Pulse (!) 115   Temp 97.6  ?F (36.4 ?C) (Oral)   Resp 20   Ht 5\' 4"  (1.626 m)   Wt 109 kg   SpO2 100%   BMI 41.25 kg/m?  ? ?Physical Exam ?Vitals and nursing note reviewed.  ?Constitutional:   ?   Appearance: Normal appearance.  ?HENT:  ?   Head: Normocephalic and atraumatic.  ?   Nose: Nose normal.  ?   Mouth/Throat:  ?   Mouth: Mucous membranes are moist.  ?Eyes:  ?   Extraocular Movements: Extraocular movements intact.  ?   Conjunctiva/sclera: Conjunctivae normal.  ?Cardiovascular:  ?   Rate and Rhythm: Tachycardia present.  ?   Comments: Mechanical click ?Pulmonary:  ?   Effort: Pulmonary effort is normal. Tachypnea present.  ?   Breath sounds: Examination of the right-lower field reveals wheezing. Decreased breath sounds and wheezing present.  ?Abdominal:  ?   General: Abdomen is flat.  ?   Palpations: Abdomen is soft.  ?   Tenderness: There is no abdominal tenderness.  ?Musculoskeletal:     ?   General: No swelling. Normal range of motion.  ?   Cervical back: Neck supple.  ?   Right lower leg: Edema (trace) present.  ?   Left lower leg: Edema (trace) present.  ?Skin: ?   General: Skin is warm and dry.  ?Neurological:  ?   General: No focal deficit present.  ?   Mental Status: She is alert.  ?Psychiatric:  ?   Comments: Anxious and tearful  ? ? ?ED Results / Procedures / Treatments   ?EKG ?EKG Interpretation ? ?Date/Time:  Tuesday Jan 12 2022 02:50:14 EDT ?Ventricular Rate:  114 ?PR Interval:    ?QRS Duration: 85 ?QT Interval:  370 ?QTC Calculation: 510 ?R Axis:   49 ?Text Interpretation: Atrial flutter with predominant 2:1 AV block Borderline prolonged QT interval Baseline wander in lead(s) V6 Since last tracing Atrial flutter has replaced Normal sinus rhythm Confirmed by [id number] [phone]) on 01/12/2022 3:08:17 AM ? ?Procedures ?7/2/2023Critical Care ?Performed by: Marland Kitchen, MD ?Authorized by: Pollyann Savoy, MD  ? ?Critical care provider statement:  ?  Critical care time (minutes):  45 ?  Critical care time was  exclusive of:  Separately billable procedures and treating other patients ?  Critical care was necessary to treat or prevent imminent or life-threatening deterioration of the following conditions:  Respiratory failure ?  Critical care was time spent personally by me on the following activities:  Development of treatment plan with patient or surrogate, discussions with consultants, evaluation of patient's response to treatment, examination of patient, ordering and review of laboratory studies, ordering and review of radiographic studies, ordering and performing treatments and interventions, pulse oximetry, re-evaluation of patient's condition and review of old charts ?  Care discussed with: admitting provider   ? ?  Medications Ordered in the ED ?Medications  ?furosemide (LASIX) injection 80 mg (80 mg Intravenous Given 01/12/22 0243)  ?LORazepam (ATIVAN) injection 0.5 mg (0.5 mg Intravenous Given 01/12/22 0309)  ? ? ?Initial Impression and Plan ? Patient here with acute SOB, some component of anxiety but also has wheezing and CXR showing pulm edema. I personally viewed the images from radiology studies and agree with radiologist interpretation: Will trial BiPAP for comfort. Start Lasix. Awaiting labs.  ? ? ?ED Course  ? ?Clinical Course as of 01/12/22 0336  ?Tue Jan 12, 2022  ?0304 CBC with moderate leukocytosis. BMP is unremarkable. BNP is mildly elevated. INR is subtherapeutic. Trop is normal.  [CS]  ?[id number] Patient's work of breathing has greatly improved with BiPAP. Still feeling anxious, will give low dose ativan.  [CS]  ?0306 Patient reports she has been out of her coumadin. Will begin heparin for now. Hospitalist paged for admission.  [CS]  ?[id number] Spoke with Dr. Carren RangZierle-Ghosh, Hospitalist, who will evaluate for admission.  [CS]  ?  ?Clinical Course User Index ?[CS] Pollyann SavoySheldon, Nelani Schmelzle B, MD  ? ? ? ?MDM Rules/Calculators/A&P ?Medical Decision Making ?Problems Addressed: ?Acute pulmonary edema (HCC): acute illness or  injury ?Acute respiratory failure with hypoxia [facility](HCC): acute illness or injury ?History of heart valve replacement with mechanical valve: chronic illness or injury ?Subtherapeutic anticoagulation: acute illness or injury ? ?Amount

## 2022-01-12 NOTE — Assessment & Plan Note (Addendum)
1. Acute congestive heart failure exacerbation ?1. continue 40 mg IV Lasix twice daily ?2. Last Echo June 2020 shows an EF of 45-50% with reduced right ventricle systolic function  ?3. daily weights, fluid restriction, strict Is and Os ?4. Restart home meds including aspirin, metoprolol, spironolactone, holding torsemide ?5. Consult TOC for medication assistance and PCP assistance ? ?

## 2022-01-12 NOTE — ED Triage Notes (Signed)
Ems was called for sob on arrival pt was 88% on room air. Pt took breathing treatment prior to the their arrival.  ?

## 2022-01-12 NOTE — Progress Notes (Signed)
Patient placed on Servo on Noninvasive mode Pressure Control.  Settings are currently at 10/5, BUR 16, 40%.  Patient's rate was in upper 30s, but coming down slowly.  HR has came down to 114 from 130s.  Patient's WOB is better and sat is currently 99%.  Will continue to monitor patient. ?

## 2022-01-12 NOTE — Progress Notes (Signed)
ANTICOAGULATION CONSULT NOTE - Follow Up Consult ? ?Pharmacy Consult for heparin and warfarin ?Indication:  MVR ? ?Allergies  ?Allergen Reactions  ? Bee Venom Anaphylaxis  ? Lisinopril Anaphylaxis, Shortness Of Breath, Swelling and Other (See Comments)  ?  Throat swells  ? Penicillins Shortness Of Breath, Swelling and Other (See Comments)  ?  Has patient had a PCN reaction causing immediate rash, facial/tongue/throat swelling, SOB or lightheadedness with hypotension: Yes ?Has patient had a PCN reaction causing severe rash involving mucus membranes or skin necrosis: Yes ?Has patient had a PCN reaction that required hospitalization No ?Has patient had a PCN reaction occurring within the last 10 years: No ?If all of the above answers are "NO", then may proceed with Cephalosporin use. ?  ? Perflutren Lipid Microsphere Shortness Of Breath and Other (See Comments)  ?  Chest Pain/Tightness, also  ? Shellfish Allergy Anaphylaxis  ? Contrast Media [Iodinated Contrast Media] Hives, Itching and Other (See Comments)  ?  Itching and hives to throat, neck, face and arms.   No difficulty breathing.   This was given at [facility] approximately 2015. Contrast Dye  ? ? ?Patient Measurements: ?Height: 5\' 4"  (162.6 cm) ?Weight: 104.9 kg (231 lb 4.2 oz) ?IBW/kg (Calculated) : 54.7 ?Heparin Dosing Weight: 80kg ? ?Vital Signs: ?Temp: 97.8 ?F (36.6 ?C) (05/02 [id number]) ?Temp Source: Oral (05/02 [id number]) ?BP: 141/90 (05/02 1000) ?Pulse Rate: 115 (05/02 1000) ? ?Labs: ?Recent Labs  ?  01/12/22 ?0235 01/12/22 ?0438 01/12/22 ?1136  ?HGB 10.8*  --   --   ?HCT 34.4*  --   --   ?PLT 435*  --   --   ?LABPROT 14.2  --   --   ?INR 1.1  --   --   ?HEPARINUNFRC  --   --  0.12*  ?CREATININE 0.91  --   --   ?TROPONINIHS 5 5  --   ? ? ? ?Estimated Creatinine Clearance: 95.1 mL/min (by C-G formula based on SCr of 0.91 mg/dL). ? ? ?Medical History: ?Past Medical History:  ?Diagnosis Date  ? Anxiety   ? Arthritis   ? "lower back" (04/04/2018)  ?  Bipolar disorder ([place])   ? Chronic bronchitis ([place])   ? Chronic diastolic CHF (congestive heart failure) ([place])   ? Chronic lower back pain   ? DDD (degenerative disc disease), lumbar   ? Depression   ? Dyspnea   ? GERD (gastroesophageal reflux disease)   ? Headache   ? "weekly" (04/04/2018), miragrains in past (04/03/2019)  ? Heart murmur   ? Hypertension   ? Mitral valve disease   ? Normocytic anemia   ? Obesity   ? Palpitations   ? Pericarditis   ? Pneumonia   ? Premature atrial contractions   ? Pulmonary hypertension ([place])   ? PVC's (premature ventricular contractions)   ? a. h/o palpitations with event monitor in 03/2017 showing NSR with PACs/PVCs.  ? Recurrent mitral valve stenosis and regurgitation s/p mitral valve repair   ? S/P mitral valve repair 03/27/2013  ? Dr. Fransico Michael - [place], [place] - complex valvuloplasty including resuspension of entire posterior leaflet using Gore-tex neochords and 30 mm Edwards Physio ring annuloplasty  ? S/P redo mitral valve replacement with mechanical valve 04/05/2019  ? 29 mm Sorin Carbomedics Optiform bileaflet mechanical valve  ? S/P tricuspid valve repair 04/05/2019  ? 28 mm Edwards mc3 ring annuloplasty  ? Tobacco abuse   ? Tricuspid regurgitation   ? ? ?Assessment: ?41yo  female c/o SOB, admitted for acute pulmonary edema, to start heparin for MVR; of note pt is well known to be noncompliant with Coumadin, unclear if she is currently seeing a Coumadin clinic but was last seen in [facility] Coumadin clinic in 2020 and was discharged from that clinic in 2022, no other AC notes in care everywhere, pt states she is out of Coumadin, INR at baseline. ? ?Home dose of warfarin was 5mg  daily, patient non compliant due to running out of medication. Admit INR 1.1  ? ?HL 0.12, subtherapeutic  ? ?Goal of Therapy:  ?INR goal 2.5-3.5  ?Heparin level 0.3-0.7 units/ml ?Monitor platelets by anticoagulation protocol: Yes ?  ?Plan:  ?Warfarin 7.5mg  daily  ?Rebolus heparin 2000 units IV bolus x1  followed by increased infusion at 1600 units/hr and monitor heparin levels in 6 hours and CBC daily. ? ?Thomasenia Sales, PharmD, MBA ?Clinical Pharmacist ? ?01/12/2022,12:53 PM ? ? ?

## 2022-01-12 NOTE — H&P (Signed)
?History and Physical  ? ? ?Patient: Theresa Crawford [id number] DOB: 07-31-1981 ?DOA: 01/12/2022 ?DOS: the patient was seen and examined on 01/12/2022 ?PCP: Kerin Perna, NP  ?Patient coming from: Home ? ?Chief Complaint:  ?Chief Complaint  ?Patient presents with  ? Shortness of Breath  ? ?HPI: Theresa Crawford is a 41 y.o. female with medical history significant of bipolar disorder, chronic CHF, depression, GERD, heart murmur, palpitations, pulmonary hypertension, recurrent mitral valve stenosis and regurgitation status post mitral valve repair, tobacco use disorder, and more presents the ED with a chief complaint of shortness of breath and chest tightness.  Patient reports that started the night prior to presentation and continued to get worse throughout the day.  She reports that her associated chest pain is like a tightness.  She has associated palpitations, diaphoresis, headache, nausea.  Patient denies vomiting.  Patient reports that since she can hear her mechanical valve take she knows that her heart is racing and it was going very fast earlier today.  Patient reports productive cough but no fever.  Patient admits to constipation and reports that she has to strain to have a bowel movement patient has been on her medications for quite some time and is very careful about not having a doctor or access to her medications. ? ?Patient reports she does smoke, but does not want a nicotine patch.  She does not drink alcohol, she does not use illicit drugs.  She is not vaccinated for COVID.  Patient is full code. ?Review of Systems: As mentioned in the history of present illness. All other systems reviewed and are negative. ?Past Medical History:  ?Diagnosis Date  ? Anxiety   ? Arthritis   ? "lower back" (04/04/2018)  ? Bipolar disorder ([place])   ? Chronic bronchitis ([place])   ? Chronic diastolic CHF (congestive heart failure) ([place])   ? Chronic lower back pain   ? DDD (degenerative disc disease), lumbar   ? Depression    ? Dyspnea   ? GERD (gastroesophageal reflux disease)   ? Headache   ? "weekly" (04/04/2018), miragrains in past (04/03/2019)  ? Heart murmur   ? Hypertension   ? Mitral valve disease   ? Normocytic anemia   ? Obesity   ? Palpitations   ? Pericarditis   ? Pneumonia   ? Premature atrial contractions   ? Pulmonary hypertension ([place])   ? PVC's (premature ventricular contractions)   ? a. h/o palpitations with event monitor in 03/2017 showing NSR with PACs/PVCs.  ? Recurrent mitral valve stenosis and regurgitation s/p mitral valve repair   ? S/P mitral valve repair 03/27/2013  ? Dr. Fransico Michael - [place], [place] - complex valvuloplasty including resuspension of entire posterior leaflet using Gore-tex neochords and 30 mm Edwards Physio ring annuloplasty  ? S/P redo mitral valve replacement with mechanical valve 04/05/2019  ? 29 mm Sorin Carbomedics Optiform bileaflet mechanical valve  ? S/P tricuspid valve repair 04/05/2019  ? 28 mm Edwards mc3 ring annuloplasty  ? Tobacco abuse   ? Tricuspid regurgitation   ? ?Past Surgical History:  ?Procedure Laterality Date  ? ABDOMINAL HERNIA REPAIR  ~ 2011  ? CARDIAC CATHETERIZATION  2014  ? CESAREAN SECTION  2004; 2009  ? HERNIA REPAIR    ? MITRAL VALVE REPAIR  03/27/2013  ? Dr. Fransico Michael - [place], [place]. - complex valvuloplasty including resuspension of posterior leaflet with 30 mm CE Physio ring annuloplasty  ? MITRAL VALVE REPLACEMENT N/A 04/05/2019  ?  Procedure: Mitral Valve (Mv) Replacement using Carbomedics Optiform Valve size 60mm;  Surgeon: Rexene Alberts, MD;  Location: [place];  Service: Open Heart Surgery;  Laterality: N/A;  ? MULTIPLE EXTRACTIONS WITH ALVEOLOPLASTY N/A 04/03/2018  ? Procedure: Extraction of tooth #30 with alveoloplasty and gross debridement of remaining teeth;  Surgeon: Lenn Cal, DDS;  Location: [place];  Service: Oral Surgery;  Laterality: N/A;  ? PARTIAL HYSTERECTOMY  2011  ? PID w/problem with fallopian tubes, had left fallopian and left  ovary removed, pt still having periods  ? RIGHT HEART CATH N/A 03/27/2019  ? Procedure: RIGHT HEART CATH;  Surgeon: Larey Dresser, MD;  Location: [place] [facility];  Service: Cardiovascular;  Laterality: N/A;  ? RIGHT/LEFT HEART CATH AND CORONARY ANGIOGRAPHY N/A 03/05/2019  ? Procedure: RIGHT/LEFT HEART CATH AND CORONARY ANGIOGRAPHY;  Surgeon: Burnell Blanks, MD;  Location: [place] [facility];  Service: Cardiovascular;  Laterality: N/A;  ? TEE WITHOUT CARDIOVERSION N/A 05/26/2016  ? Procedure: TRANSESOPHAGEAL ECHOCARDIOGRAM (TEE);  Surgeon: Herminio Commons, MD;  [facility];  Service: Cardiovascular;  Laterality: N/A;  ? TEE WITHOUT CARDIOVERSION N/A 01/25/2018  ? Procedure: TRANSESOPHAGEAL ECHOCARDIOGRAM (TEE) WITH PROPOFOL;  Surgeon: Satira Sark, MD;  [facility];  Service: Cardiovascular;  Laterality: N/A;  ? TEE WITHOUT CARDIOVERSION N/A 03/05/2019  ? Procedure: TRANSESOPHAGEAL ECHOCARDIOGRAM (TEE);  Surgeon: Fay Records, MD;  Location: [place];  Service: Cardiovascular;  Laterality: N/A;  ? TEE WITHOUT CARDIOVERSION N/A 04/05/2019  ? Procedure: TRANSESOPHAGEAL ECHOCARDIOGRAM (TEE);  Surgeon: Rexene Alberts, MD;  Location: [place];  Service: Open Heart Surgery;  Laterality: N/A;  ? TRICUSPID VALVE REPLACEMENT N/A 04/05/2019  ? Procedure: Tricuspid Valve Repair using Edwards MC3 Tricuspid ring size 107mm;  Surgeon: Rexene Alberts, MD;  Location: [place];  Service: Open Heart Surgery;  Laterality: N/A;  ? ?Social History:  reports that she has been smoking cigarettes. She started smoking about 22 years ago. She has a 12.00 pack-year smoking history. She has never used smokeless tobacco. She reports current alcohol use. She reports current drug use. Drug: Marijuana. ? ?Allergies  ?Allergen Reactions  ? Bee Venom Anaphylaxis  ? Lisinopril Anaphylaxis, Shortness Of Breath, Swelling and Other (See Comments)  ?  Throat swells  ? Penicillins Shortness Of Breath, Swelling  and Other (See Comments)  ?  Has patient had a PCN reaction causing immediate rash, facial/tongue/throat swelling, SOB or lightheadedness with hypotension: Yes ?Has patient had a PCN reaction causing severe rash involving mucus membranes or skin necrosis: Yes ?Has patient had a PCN reaction that required hospitalization No ?Has patient had a PCN reaction occurring within the last 10 years: No ?If all of the above answers are "NO", then may proceed with Cephalosporin use. ?  ? Perflutren Lipid Microsphere Shortness Of Breath and Other (See Comments)  ?  Chest Pain/Tightness, also  ? Shellfish Allergy Anaphylaxis  ? Contrast Media [Iodinated Contrast Media] Hives, Itching and Other (See Comments)  ?  Itching and hives to throat, neck, face and arms.   No difficulty breathing.   This was given at [facility] approximately 2015. Contrast Dye  ? ? ?Family History  ?Problem Relation Age of Onset  ? Heart failure Father   ?     just received LVAD  ? Heart disease Paternal Grandfather   ?     stent placement  ? ? ?Prior to Admission medications   ?Medication Sig Start Date End  Date Taking? Authorizing Provider  ?acetaminophen (TYLENOL) 325 MG tablet Take 2 tablets (650 mg total) by mouth every 6 (six) hours as needed for mild pain. 04/13/19   Nani Skillern, PA-C  ?albuterol (PROVENTIL HFA;VENTOLIN HFA) 108 (90 Base) MCG/ACT inhaler Inhale 2 puffs into the lungs every 6 (six) hours as needed for wheezing or shortness of breath.    [provider]  ?aspirin EC 81 MG EC tablet Take 1 tablet (81 mg total) by mouth daily. 04/13/19   Nani Skillern, PA-C  ?budesonide (PULMICORT) 0.5 MG/2ML nebulizer solution Take 2 mLs (0.5 mg total) by nebulization 2 (two) times daily. 07/24/19 08/23/19  Garner Nash, DO  ?budesonide (PULMICORT) 180 MCG/ACT inhaler Inhale 1 puff into the lungs 2 (two) times a day. 03/29/19   Rama, Venetia Maxon, MD  ?busPIRone (BUSPAR) 7.5 MG tablet Take 1 tablet (7.5 mg  total) by mouth 3 (three) times daily. 10/31/19   Kerin Perna, NP  ?docusate sodium (COLACE) 100 MG capsule Take 100 mg by mouth daily as needed for moderate constipation.     Provider, Historical,

## 2022-01-12 NOTE — Hospital Course (Signed)
41 y.o. female with medical history significant of bipolar disorder, chronic CHF, depression, GERD, heart murmur, palpitations, pulmonary hypertension, recurrent mitral valve stenosis and regurgitation status post mitral valve repair, tobacco use disorder, and more presents the ED with a chief complaint of shortness of breath and chest tightness.  Patient reports that started the night prior to presentation and continued to get worse throughout the day.  She reports that her associated chest pain is like a tightness.  She has associated palpitations, diaphoresis, headache, nausea.  Patient denies vomiting.  Patient reports that since she can hear her mechanical valve take she knows that her heart is racing and it was going very fast earlier today.  Patient reports productive cough but no fever.  Patient admits to constipation and reports that she has to strain to have a bowel movement patient has been on her medications for quite some time and is very careful about not having a doctor or access to her medications. ?  ?Patient reports she does smoke, but does not want a nicotine patch.  She does not drink alcohol, she does not use illicit drugs.  She is not vaccinated for COVID.  Patient is full code. ?

## 2022-01-12 NOTE — Progress Notes (Signed)
ANTICOAGULATION CONSULT NOTE - Initial Consult ? ?Pharmacy Consult for heparin and warfarin ?Indication:  MVR ? ?Allergies  ?Allergen Reactions  ? Bee Venom Anaphylaxis  ? Lisinopril Anaphylaxis, Shortness Of Breath, Swelling and Other (See Comments)  ?  Throat swells  ? Penicillins Shortness Of Breath, Swelling and Other (See Comments)  ?  Has patient had a PCN reaction causing immediate rash, facial/tongue/throat swelling, SOB or lightheadedness with hypotension: Yes ?Has patient had a PCN reaction causing severe rash involving mucus membranes or skin necrosis: Yes ?Has patient had a PCN reaction that required hospitalization No ?Has patient had a PCN reaction occurring within the last 10 years: No ?If all of the above answers are "NO", then may proceed with Cephalosporin use. ?  ? Perflutren Lipid Microsphere Shortness Of Breath and Other (See Comments)  ?  Chest Pain/Tightness, also  ? Shellfish Allergy Anaphylaxis  ? Contrast Media [Iodinated Contrast Media] Hives, Itching and Other (See Comments)  ?  Itching and hives to throat, neck, face and arms.   No difficulty breathing.   This was given at [facility] approximately 2015. Contrast Dye  ? ? ?Patient Measurements: ?Height: 5\' 4"  (162.6 cm) ?Weight: 104.9 kg (231 lb 4.2 oz) ?IBW/kg (Calculated) : 54.7 ?Heparin Dosing Weight: 80kg ? ?Vital Signs: ?Temp: 97.8 ?F (36.6 ?C) (05/02 01-03-2000) ?Temp Source: Oral (05/02 01-03-2000) ?BP: 145/76 (05/02 0804) ?Pulse Rate: 115 (05/02 0630) ? ?Labs: ?Recent Labs  ?  01/12/22 ?0235 01/12/22 ?0438  ?HGB 10.8*  --   ?HCT 34.4*  --   ?PLT 435*  --   ?LABPROT 14.2  --   ?INR 1.1  --   ?CREATININE 0.91  --   ?TROPONINIHS 5 5  ? ? ? ?Estimated Creatinine Clearance: 95.1 mL/min (by C-G formula based on SCr of 0.91 mg/dL). ? ? ?Medical History: ?Past Medical History:  ?Diagnosis Date  ? Anxiety   ? Arthritis   ? "lower back" (04/04/2018)  ? Bipolar disorder (HCC)   ? Chronic bronchitis (HCC)   ? Chronic diastolic CHF  (congestive heart failure) (HCC)   ? Chronic lower back pain   ? DDD (degenerative disc disease), lumbar   ? Depression   ? Dyspnea   ? GERD (gastroesophageal reflux disease)   ? Headache   ? "weekly" (04/04/2018), miragrains in past (04/03/2019)  ? Heart murmur   ? Hypertension   ? Mitral valve disease   ? Normocytic anemia   ? Obesity   ? Palpitations   ? Pericarditis   ? Pneumonia   ? Premature atrial contractions   ? Pulmonary hypertension (HCC)   ? PVC's (premature ventricular contractions)   ? a. h/o palpitations with event monitor in 03/2017 showing NSR with PACs/PVCs.  ? Recurrent mitral valve stenosis and regurgitation s/p mitral valve repair   ? S/P mitral valve repair 03/27/2013  ? Dr. 03/29/2013 - [place], [place] - complex valvuloplasty including resuspension of entire posterior leaflet using Gore-tex neochords and 30 mm Edwards Physio ring annuloplasty  ? S/P redo mitral valve replacement with mechanical valve 04/05/2019  ? 29 mm Sorin Carbomedics Optiform bileaflet mechanical valve  ? S/P tricuspid valve repair 04/05/2019  ? 28 mm Edwards mc3 ring annuloplasty  ? Tobacco abuse   ? Tricuspid regurgitation   ? ? ?Assessment: ?41yo female c/o SOB, admitted for acute pulmonary edema, to start heparin for MVR; of note pt is well known to be noncompliant with Coumadin, unclear if she is currently seeing a Coumadin clinic  but was last seen in [facility] in 2020 and was discharged from that clinic in 2022, no other AC notes in care everywhere, pt states she is out of Coumadin, INR at baseline. ? ?Home dose of warfarin was 5mg  daily, patient non compliant due to running out of medication. Admit INR 1.1  ? ?Goal of Therapy:  ?INR goal 2.5-3.5  ?Heparin level 0.3-0.7 units/ml ?Monitor platelets by anticoagulation protocol: Yes ?  ?Plan:  ?Warfarin 7.5mg  daily  ?Heparin 4000 units IV bolus x1 followed by infusion at 1300 units/hr and monitor heparin levels and CBC. ? ? , PharmD, MBA ?Clinical  Pharmacist ? ?01/12/2022,8:49 AM ? ? ?

## 2022-01-12 NOTE — Assessment & Plan Note (Signed)
Reports smoking less than half a pack a day  ?-counseled on cessation ?-Patient declines nicotine patch at this time ?

## 2022-01-12 NOTE — Assessment & Plan Note (Signed)
-   Secondary to CHF exacerbation ?-Likely the top contributor to dyspnea ?-Continue Lasix ?-Continue to monitor ?

## 2022-01-12 NOTE — ED Notes (Addendum)
Pt states she feels very anxious, asking for something to calm her down. Dr. Bernette Mayers made aware. Awaiting orders  ?

## 2022-01-12 NOTE — Assessment & Plan Note (Addendum)
-   Mechanical valve INR goal 2.5-3.5 ?-INR today 1.1 ?-Start heparin drip for bridging ?-warfarin per pharmacy requested ?

## 2022-01-12 NOTE — Consult Note (Signed)
?Cardiology Consultation:  ? ?Patient ID: Theresa Crawford ?MRN: [id number]; DOB: 1980/11/26 ? ?Admit date: 01/12/2022 ?Date of Consult: 01/12/2022 ? ?PCP:  Kerin Perna, NP ?  ?[facility] Providers ?Cardiologist:  Kate Sable, MD (Inactive)   { ? ? ? ?Patient Profile:  ? ?Theresa Crawford is a 41 y.o. female with a hx of chronic diastolic HF, mechanical MV, pulm HTN  who is being seen 01/12/2022 for the evaluation of SOB at the request of Dr Wynetta Emery. ? ?History of Present Illness:  ? ?Theresa Crawford 41 yo female history of chronic diastolic HF, RV dysfunction, suspected systemic sarcoid, pulm HTN, MV repair with some post repair stenosis admitted with SOB and chest pain. REports over the last month progression LE edema, abdominal distension, orthopnea. Mixed compliance with torsemide. Has also had some nasal congestion, productive cough. Symptos progressed, began developing some chest pressure worst with deep breathing and coughing, feeling of palpitations and heart racing.  ? ? ?Ddimer 1.52 BNP 129 K 4.2 Cr 0.91 bUN 19 WBC 16.9 Hgb 10.8 Plt 435 INR 1.1 ?Trop 5-->5 ?CXR diffuse bilateral airpsace opacities, ? Edema vs infection ? ? ?Past Medical History:  ?Diagnosis Date  ? Anxiety   ? Arthritis   ? "lower back" (04/04/2018)  ? Bipolar disorder ([place])   ? Chronic bronchitis ([place])   ? Chronic diastolic CHF (congestive heart failure) ([place])   ? Chronic lower back pain   ? DDD (degenerative disc disease), lumbar   ? Depression   ? Dyspnea   ? GERD (gastroesophageal reflux disease)   ? Headache   ? "weekly" (04/04/2018), miragrains in past (04/03/2019)  ? Heart murmur   ? Hypertension   ? Mitral valve disease   ? Normocytic anemia   ? Obesity   ? Palpitations   ? Pericarditis   ? Pneumonia   ? Premature atrial contractions   ? Pulmonary hypertension ([place])   ? PVC's (premature ventricular contractions)   ? a. h/o palpitations with event monitor in 03/2017 showing NSR with PACs/PVCs.  ? Recurrent mitral valve stenosis and  regurgitation s/p mitral valve repair   ? S/P mitral valve repair 03/27/2013  ? Dr. Fransico Michael - [place], [place] - complex valvuloplasty including resuspension of entire posterior leaflet using Gore-tex neochords and 30 mm Edwards Physio ring annuloplasty  ? S/P redo mitral valve replacement with mechanical valve 04/05/2019  ? 29 mm Sorin Carbomedics Optiform bileaflet mechanical valve  ? S/P tricuspid valve repair 04/05/2019  ? 28 mm Edwards mc3 ring annuloplasty  ? Tobacco abuse   ? Tricuspid regurgitation   ? ? ?Past Surgical History:  ?Procedure Laterality Date  ? ABDOMINAL HERNIA REPAIR  ~ 2011  ? CARDIAC CATHETERIZATION  2014  ? CESAREAN SECTION  2004; 2009  ? HERNIA REPAIR    ? MITRAL VALVE REPAIR  03/27/2013  ? Dr. Fransico Michael - [place], [place]. - complex valvuloplasty including resuspension of posterior leaflet with 30 mm CE Physio ring annuloplasty  ? MITRAL VALVE REPLACEMENT N/A 04/05/2019  ? Procedure: Mitral Valve (Mv) Replacement using Carbomedics Optiform Valve size 38mm;  Surgeon: Rexene Alberts, MD;  Location: [place];  Service: Open Heart Surgery;  Laterality: N/A;  ? MULTIPLE EXTRACTIONS WITH ALVEOLOPLASTY N/A 04/03/2018  ? Procedure: Extraction of tooth #30 with alveoloplasty and gross debridement of remaining teeth;  Surgeon: Lenn Cal, DDS;  Location: [facility];  Service: Oral Surgery;  Laterality: N/A;  ? PARTIAL HYSTERECTOMY  2011  ? PID w/problem with  fallopian tubes, had left fallopian and left ovary removed, pt still having periods  ? RIGHT HEART CATH N/A 03/27/2019  ? Procedure: RIGHT HEART CATH;  Surgeon: Larey Dresser, MD;  Location: [facility];  Service: Cardiovascular;  Laterality: N/A;  ? RIGHT/LEFT HEART CATH AND CORONARY ANGIOGRAPHY N/A 03/05/2019  ? Procedure: RIGHT/LEFT HEART CATH AND CORONARY ANGIOGRAPHY;  Surgeon: Burnell Blanks, MD;  Location: [facility];  Service: Cardiovascular;  Laterality: N/A;  ? TEE WITHOUT CARDIOVERSION N/A 05/26/2016  ?  Procedure: TRANSESOPHAGEAL ECHOCARDIOGRAM (TEE);  Surgeon: Herminio Commons, MD;  [facility];  Service: Cardiovascular;  Laterality: N/A;  ? TEE WITHOUT CARDIOVERSION N/A 01/25/2018  ? Procedure: TRANSESOPHAGEAL ECHOCARDIOGRAM (TEE) WITH PROPOFOL;  Surgeon: Satira Sark, MD;  [facility];  Service: Cardiovascular;  Laterality: N/A;  ? TEE WITHOUT CARDIOVERSION N/A 03/05/2019  ? Procedure: TRANSESOPHAGEAL ECHOCARDIOGRAM (TEE);  Surgeon: Fay Records, MD;  Location: [facility];  Service: Cardiovascular;  Laterality: N/A;  ? TEE WITHOUT CARDIOVERSION N/A 04/05/2019  ? Procedure: TRANSESOPHAGEAL ECHOCARDIOGRAM (TEE);  Surgeon: Rexene Alberts, MD;  Location: [facility];  Service: Open Heart Surgery;  Laterality: N/A;  ? TRICUSPID VALVE REPLACEMENT N/A 04/05/2019  ? Procedure: Tricuspid Valve Repair using Edwards MC3 Tricuspid ring size 64mm;  Surgeon: Rexene Alberts, MD;  Location: [facility] [place];  Service: Open Heart Surgery;  Laterality: N/A;  ?  ? ? ?Inpatient Medications: ?Scheduled Meds: ? aspirin EC  81 mg Oral Daily  ? budesonide (PULMICORT) nebulizer solution  0.25 mg Nebulization BID  ? busPIRone  7.5 mg Oral TID  ? Chlorhexidine Gluconate Cloth  6 each Topical Q0600  ? furosemide  40 mg Intravenous BID  ? metoprolol succinate  25 mg Oral Daily  ? polyethylene glycol  17 g Oral Daily  ? QUEtiapine  50 mg Oral QHS  ? sertraline  25 mg Oral Daily  ? spironolactone  25 mg Oral Daily  ? warfarin  7.5 mg Oral q1600  ? Warfarin - Physician Dosing Inpatient   Does not apply q1600  ? ?Continuous Infusions: ? heparin 1,300 Units/hr (01/12/22 0511)  ? ?PRN Meds: ?acetaminophen **OR** acetaminophen, albuterol, docusate sodium, hydrOXYzine, morphine injection, ondansetron **OR** ondansetron (ZOFRAN) IV, oxyCODONE ? ?Allergies:    ?Allergies  ?Allergen Reactions  ? Bee Venom Anaphylaxis  ? Lisinopril Anaphylaxis, Shortness Of Breath, Swelling and Other (See Comments)  ?  Throat swells  ? Penicillins  Shortness Of Breath, Swelling and Other (See Comments)  ?  Has patient had a PCN reaction causing immediate rash, facial/tongue/throat swelling, SOB or lightheadedness with hypotension: Yes ?Has patient had a PCN reaction causing severe rash involving mucus membranes or skin necrosis: Yes ?Has patient had a PCN reaction that required hospitalization No ?Has patient had a PCN reaction occurring within the last 10 years: No ?If all of the above answers are "NO", then may proceed with Cephalosporin use. ?  ? Perflutren Lipid Microsphere Shortness Of Breath and Other (See Comments)  ?  Chest Pain/Tightness, also  ? Shellfish Allergy Anaphylaxis  ? Contrast Media [Iodinated Contrast Media] Hives, Itching and Other (See Comments)  ?  Itching and hives to throat, neck, face and arms.   No difficulty breathing.   This was given at [facility] approximately 2015. Contrast Dye  ? ? ?Social History:   ?Social History  ? ?Socioeconomic History  ? Marital status: Single  ?  Spouse name: Not on file  ? Number  of children: 2  ? Years of education: Not on file  ? Highest education level: Not on file  ?Occupational History  ? Not on file  ?Tobacco Use  ? Smoking status: Every Day  ?  Packs/day: 0.50  ?  Years: 24.00  ?  Pack years: 12.00  ?  Types: Cigarettes  ?  Start date: 11/03/1999  ? Smokeless tobacco: Never  ?Vaping Use  ? Vaping Use: Never used  ?Substance and Sexual Activity  ? Alcohol use: Yes  ? Drug use: Yes  ?  Types: Marijuana  ? Sexual activity: Not Currently  ?Other Topics Concern  ? Not on file  ?Social History Narrative  ? Not on file  ? ?Social Determinants of Health  ? ?Financial Resource Strain: Not on file  ?Food Insecurity: Not on file  ?Transportation Needs: Not on file  ?Physical Activity: Not on file  ?Stress: Not on file  ?Social Connections: Not on file  ?Intimate Partner Violence: Not on file  ?  ?Family History:   ? ?Family History  ?Problem Relation Age of Onset  ? Heart failure Father   ?      just received LVAD  ? Heart disease Paternal Grandfather   ?     stent placement  ?  ? ?ROS:  ?Please see the history of present illness.  ? ?All other ROS reviewed and negative.    ? ?Physical Exam

## 2022-01-12 NOTE — Progress Notes (Signed)
ASSUMPTION OF CARE NOTE  ? ?01/12/2022 ?6:22 PM ? ?Theresa Crawford was seen and examined.  The H&P by the admitting provider, orders, imaging was reviewed.  Please see new orders.  Will continue to follow. Appreciate cardiology consult.  ? ?41 y.o. female with medical history significant of bipolar disorder, chronic CHF, depression, GERD, heart murmur, palpitations, pulmonary hypertension, recurrent mitral valve stenosis and regurgitation status post mitral valve repair, tobacco use disorder, and more presents the ED with a chief complaint of shortness of breath and chest tightness.  Patient reports that started the night prior to presentation and continued to get worse throughout the day.  She reports that her associated chest pain is like a tightness.  She has associated palpitations, diaphoresis, headache, nausea.  Patient denies vomiting.  Patient reports that since she can hear her mechanical valve take she knows that her heart is racing and it was going very fast earlier today.  Patient reports productive cough but no fever.  Patient admits to constipation and reports that she has to strain to have a bowel movement patient has been on her medications for quite some time and is very careful about not having a doctor or access to her medications. ?  ?Patient reports she does smoke, but does not want a nicotine patch.  She does not drink alcohol, she does not use illicit drugs.  She is not vaccinated for COVID.  Patient is full code. ? ?Assessment and Plan: ?* Acute HFrEF ?Acute congestive heart failure exacerbation ?continue 40 mg IV Lasix twice daily ?Last Echo June 2020 shows an EF of 45-50% with reduced right ventricle systolic function  ?daily weights, fluid restriction, strict Is and Os ?Restart home meds including aspirin, metoprolol, spironolactone, holding torsemide ?Consult TOC for medication assistance and PCP assistance ? ? ?Subtherapeutic international normalized ratio (INR) ?- Mechanical valve INR  goal 2.5-3.5 ?-INR today 1.1 ?-Start heparin drip for bridging ?-warfarin per pharmacy requested ? ?MDD (major depressive disorder), recurrent episode ([place]) ?- Patient has been out of all of her meds ?Restart lowest dose of Zoloft -patient will likely need to be titrated back up in the outpatient setting ?-Continue BuSpar for anxiety ?-Continue Atarax for anxiety ?-Continue Seroquel for insomnia ?-Continue to monitor ? ?Tobacco abuse ?Reports smoking less than half a pack a day  ?-counseled on cessation ?-Patient declines nicotine patch at this time ? ?Pulmonary edema ?- Secondary to CHF exacerbation ?-Likely the top contributor to dyspnea ?-Continue Lasix ?-Continue to monitor ? ? ?Vitals:  ? 01/12/22 1600 01/12/22 1621  ?BP: 123/73   ?Pulse: (!) 115 (!) 109  ?Resp: (!) 26 (!) 25  ?Temp:  98.7 ?F (37.1 ?C)  ?SpO2: 95% 98%  ? ? ?Results for orders placed or performed during the hospital encounter of 01/12/22  ?MRSA Next Gen by PCR, Nasal  ? Specimen: Nasal Mucosa; Nasal Swab  ?Result Value Ref Range  ? MRSA by PCR Next Gen NOT DETECTED NOT DETECTED  ?Brain natriuretic peptide  ?Result Value Ref Range  ? B Natriuretic Peptide 129.0 (H) 0.0 - 100.0 pg/mL  ?Comprehensive metabolic panel  ?Result Value Ref Range  ? Sodium 137 135 - 145 mmol/L  ? Potassium 4.2 3.5 - 5.1 mmol/L  ? Chloride 108 98 - 111 mmol/L  ? CO2 22 22 - 32 mmol/L  ? Glucose, Bld 122 (H) 70 - 99 mg/dL  ? BUN 19 6 - 20 mg/dL  ? Creatinine, Ser 0.91 0.44 - 1.00 mg/dL  ? Calcium 8.6 (  L) 8.9 - 10.3 mg/dL  ? Total Protein 8.2 (H) 6.5 - 8.1 g/dL  ? Albumin 3.9 3.5 - 5.0 g/dL  ? AST 30 15 - 41 U/L  ? ALT 32 0 - 44 U/L  ? Alkaline Phosphatase 69 38 - 126 U/L  ? Total Bilirubin 0.5 0.3 - 1.2 mg/dL  ? GFR, Estimated >60 >60 mL/min  ? Anion gap 7 5 - 15  ?CBC with Differential  ?Result Value Ref Range  ? WBC 16.9 (H) 4.0 - 10.5 K/uL  ? RBC 4.15 3.87 - 5.11 MIL/uL  ? Hemoglobin 10.8 (L) 12.0 - 15.0 g/dL  ? HCT 34.4 (L) 36.0 - 46.0 %  ? MCV 82.9 80.0 - 100.0 fL  ?  MCH 26.0 26.0 - 34.0 pg  ? MCHC 31.4 30.0 - 36.0 g/dL  ? RDW 18.2 (H) 11.5 - 15.5 %  ? Platelets 435 (H) 150 - 400 K/uL  ? nRBC 0.1 0.0 - 0.2 %  ? Neutrophils Relative % 78 %  ? Neutro Abs 13.2 (H) 1.7 - 7.7 K/uL  ? Lymphocytes Relative 14 %  ? Lymphs Abs 2.4 0.7 - 4.0 K/uL  ? Monocytes Relative 6 %  ? Monocytes Absolute 0.9 0.1 - 1.0 K/uL  ? Eosinophils Relative 1 %  ? Eosinophils Absolute 0.2 0.0 - 0.5 K/uL  ? Basophils Relative 1 %  ? Basophils Absolute 0.1 0.0 - 0.1 K/uL  ? Immature Granulocytes 0 %  ? Abs Immature Granulocytes 0.06 0.00 - 0.07 K/uL  ?Protime-INR  ?Result Value Ref Range  ? Prothrombin Time 14.2 11.4 - 15.2 seconds  ? INR 1.1 0.8 - 1.2  ?Heparin level (unfractionated)  ?Result Value Ref Range  ? Heparin Unfractionated 0.12 (L) 0.30 - 0.70 IU/mL  ?D-dimer, quantitative  ?Result Value Ref Range  ? D-Dimer, Quant 1.52 (H) 0.00 - 0.50 ug/mL-FEU  ?HIV Antibody (routine testing w rflx)  ?Result Value Ref Range  ? HIV Screen 4th Generation wRfx Non Reactive Non Reactive  ?Procalcitonin - Baseline  ?Result Value Ref Range  ? Procalcitonin <0.10 ng/mL  ?Troponin I (High Sensitivity)  ?Result Value Ref Range  ? Troponin I (High Sensitivity) 5 <18 ng/L  ?Troponin I (High Sensitivity)  ?Result Value Ref Range  ? Troponin I (High Sensitivity) 5 <18 ng/L  ? ? ?C. Wynetta Emery, MD ?[facility] ? ? 01/12/2022  2:30 AM ?How to contact the [facility] Attending or Consulting provider [place] or covering provider during after hours [place], for this patient?  ?Check the care team in [facility] and look for a) attending/consulting [facility] provider listed and b) the [facility] team listed ?Log into [email or web address] and use [place]'s universal password to access. If you do not have the password, please contact the hospital operator. ?Locate the [facility] provider you are looking for under [facility] and page to a number that you can be directly reached. ?If you still have difficulty reaching the provider, please page the [facility] (Director on  Call) for the Hospitalists listed on amion for assistance. ? ?

## 2022-01-13 ENCOUNTER — Inpatient Hospital Stay (HOSPITAL_COMMUNITY): Payer: Self-pay

## 2022-01-13 DIAGNOSIS — R791 Abnormal coagulation profile: Secondary | ICD-10-CM

## 2022-01-13 DIAGNOSIS — Z952 Presence of prosthetic heart valve: Secondary | ICD-10-CM

## 2022-01-13 DIAGNOSIS — J9601 Acute respiratory failure with hypoxia: Secondary | ICD-10-CM

## 2022-01-13 DIAGNOSIS — I4892 Unspecified atrial flutter: Secondary | ICD-10-CM

## 2022-01-13 DIAGNOSIS — I5021 Acute systolic (congestive) heart failure: Secondary | ICD-10-CM

## 2022-01-13 LAB — CBC WITH DIFFERENTIAL/PLATELET
Abs Immature Granulocytes: 0.06 10*3/uL (ref 0.00–0.07)
Basophils Absolute: 0.1 10*3/uL (ref 0.0–0.1)
Basophils Relative: 1 %
Eosinophils Absolute: 0.3 10*3/uL (ref 0.0–0.5)
Eosinophils Relative: 3 %
HCT: 35.3 % — ABNORMAL LOW (ref 36.0–46.0)
Hemoglobin: 11.1 g/dL — ABNORMAL LOW (ref 12.0–15.0)
Immature Granulocytes: 1 %
Lymphocytes Relative: 32 %
Lymphs Abs: 3.2 10*3/uL (ref 0.7–4.0)
MCH: 26.3 pg (ref 26.0–34.0)
MCHC: 31.4 g/dL (ref 30.0–36.0)
MCV: 83.6 fL (ref 80.0–100.0)
Monocytes Absolute: 0.6 10*3/uL (ref 0.1–1.0)
Monocytes Relative: 6 %
Neutro Abs: 5.7 10*3/uL (ref 1.7–7.7)
Neutrophils Relative %: 57 %
Platelets: 472 10*3/uL — ABNORMAL HIGH (ref 150–400)
RBC: 4.22 MIL/uL (ref 3.87–5.11)
RDW: 18.4 % — ABNORMAL HIGH (ref 11.5–15.5)
WBC: 10 10*3/uL (ref 4.0–10.5)
nRBC: 0 % (ref 0.0–0.2)

## 2022-01-13 LAB — ECHOCARDIOGRAM COMPLETE
AR max vel: 2.49 cm2
AV Area VTI: 2.39 cm2
AV Area mean vel: 2.13 cm2
AV Mean grad: 3 mmHg
AV Peak grad: 6.2 mmHg
Ao pk vel: 1.24 m/s
Area-P 1/2: 5.68 cm2
Calc EF: 56.8 %
Height: 64 in
MV VTI: 1.9 cm2
S' Lateral: 3.1 cm
Single Plane A2C EF: 61.1 %
Single Plane A4C EF: 50.9 %
Weight: 3686.09 oz

## 2022-01-13 LAB — PROTIME-INR
INR: 1.1 (ref 0.8–1.2)
Prothrombin Time: 13.8 seconds (ref 11.4–15.2)

## 2022-01-13 LAB — COMPREHENSIVE METABOLIC PANEL
ALT: 26 U/L (ref 0–44)
AST: 22 U/L (ref 15–41)
Albumin: 3.7 g/dL (ref 3.5–5.0)
Alkaline Phosphatase: 64 U/L (ref 38–126)
Anion gap: 6 (ref 5–15)
BUN: 17 mg/dL (ref 6–20)
CO2: 24 mmol/L (ref 22–32)
Calcium: 8.7 mg/dL — ABNORMAL LOW (ref 8.9–10.3)
Chloride: 108 mmol/L (ref 98–111)
Creatinine, Ser: 0.86 mg/dL (ref 0.44–1.00)
GFR, Estimated: >60 mL/min (ref >60)
Glucose, Bld: 123 mg/dL — ABNORMAL HIGH (ref 70–99)
Potassium: 4.4 mmol/L (ref 3.5–5.1)
Sodium: 138 mmol/L (ref 135–145)
Total Bilirubin: 0.4 mg/dL (ref 0.3–1.2)
Total Protein: 7.8 g/dL (ref 6.5–8.1)

## 2022-01-13 LAB — TSH: TSH: 1.743 u[IU]/mL (ref 0.350–4.500)

## 2022-01-13 LAB — HEPARIN LEVEL (UNFRACTIONATED)
Heparin Unfractionated: 0.52 IU/mL (ref 0.30–0.70)
Heparin Unfractionated: 0.5 IU/mL (ref 0.30–0.70)

## 2022-01-13 LAB — MAGNESIUM: Magnesium: 2.3 mg/dL (ref 1.7–2.4)

## 2022-01-13 MED ORDER — LIVING BETTER WITH HEART FAILURE BOOK
Freq: Once | Status: AC
  Administered 2022-01-13: 1

## 2022-01-13 MED ORDER — ORAL CARE MOUTH RINSE
15.0000 mL | Freq: Two times a day (BID) | OROMUCOSAL | Status: DC
  Administered 2022-01-13 – 2022-01-17 (×7): 15 mL via OROMUCOSAL

## 2022-01-13 NOTE — Progress Notes (Signed)
*  PRELIMINARY RESULTS* ?Echocardiogram ?2D Echocardiogram has been performed. ? ?Carolyne Fiscal ?01/13/2022, 9:41 AM ?

## 2022-01-13 NOTE — Progress Notes (Signed)
? ? ?  Asked by Dr. Bjorn Pippin to arrange for TEE/DCCV tomorrow. Will be performed around 0945 on 01/14/2022. Dr. Wyline Mood and Echo Dept made aware. The patient will need to be NPO after midnight except for sips with medications. Orders entered.  ? ?Signed, ?Ellsworth Lennox, PA-C ?01/13/2022, 10:15 AM ? ? ?

## 2022-01-13 NOTE — Progress Notes (Signed)
? ?Progress Note ? ?Patient Name: Theresa Crawford ?Date of Encounter: 01/13/2022 ? ?[facility] Cardiologist: Kate Sable, MD (Inactive)  ? ?Subjective  ? ?I/os incomplete.  Stable creatinine at 0.9.  Weight down 1 pound from yesterday.  BP 142/100 this morning.  Denies dyspnea or chest pain ? ?Inpatient Medications  ?  ?Scheduled Meds: ? aspirin EC  81 mg Oral Daily  ? budesonide (PULMICORT) nebulizer solution  0.25 mg Nebulization BID  ? busPIRone  7.5 mg Oral TID  ? Chlorhexidine Gluconate Cloth  6 each Topical Q0600  ? furosemide  40 mg Intravenous BID  ? metoprolol tartrate  25 mg Oral TID  ? polyethylene glycol  17 g Oral Daily  ? QUEtiapine  50 mg Oral QHS  ? sertraline  25 mg Oral Daily  ? spironolactone  25 mg Oral Daily  ? warfarin  7.5 mg Oral q1600  ? Warfarin - Pharmacist Dosing Inpatient   Does not apply [id number]  ? ?Continuous Infusions: ? heparin 1,750 Units/hr (01/13/22 0819)  ? ?PRN Meds: ?acetaminophen **OR** acetaminophen, albuterol, docusate sodium, hydrOXYzine, morphine injection, ondansetron **OR** ondansetron (ZOFRAN) IV, oxyCODONE  ? ?Vital Signs  ?  ?Vitals:  ? 01/12/22 2100 01/13/22 0325 01/13/22 0734 01/13/22 0800  ?BP:      ?Pulse: (!) 115     ?Resp: 17     ?Temp:    97.7 ?F (36.5 ?C)  ?TempSrc:    Oral  ?SpO2: 98%  98%   ?Weight:  104.5 kg    ?Height:      ? ? ?Intake/Output Summary (Last 24 hours) at 01/13/2022 0836 ?Last data filed at 01/13/2022 [id number] ?Gross per 24 hour  ?Intake 1137.99 ml  ?Output 1000 ml  ?Net 137.99 ml  ? ? ?  01/13/2022  ?  3:25 AM 01/12/2022  ?  8:04 AM 01/12/2022  ?  2:27 AM  ?Last 3 Weights  ?Weight (lbs) 230 lb 6.1 oz 231 lb 4.2 oz 240 lb 4.8 oz  ?Weight (kg) 104.5 kg 104.9 kg 109 kg  ?   ? ?Telemetry  ?  ?2:1 atrial flutter, rate 110s- Personally Reviewed ? ?ECG  ?  ? ?No new ECG- Personally Reviewed ? ?Physical Exam  ? ?GEN: No acute distress.   ?Neck:  JVD difficult to appreciate given habitus ?Cardiac: Tachycardic, regular, mechanical click ?Respiratory:  Bibasilar crackles ?GI: Soft, nontender ?MS: No edema ?Neuro:  Nonfocal  ?Psych: Normal affect  ? ?Labs  ?  ?High Sensitivity Troponin:   ?Recent Labs  ?Lab 01/12/22 ?0235 01/12/22 ?[id number]  ?TROPONINIHS 5 5  ?   ?Chemistry ?Recent Labs  ?Lab 01/12/22 ?0235 01/13/22 ?0245  ?NA 137 138  ?K 4.2 4.4  ?CL 108 108  ?CO2 22 24  ?GLUCOSE 122* 123*  ?BUN 19 17  ?CREATININE 0.91 0.86  ?CALCIUM 8.6* 8.7*  ?MG  --  2.3  ?PROT 8.2* 7.8  ?ALBUMIN 3.9 3.7  ?AST 30 22  ?ALT 32 26  ?ALKPHOS 69 64  ?BILITOT 0.5 0.4  ?GFRNONAA >60 >60  ?ANIONGAP 7 6  ?  ?Lipids No results for input(s): CHOL, TRIG, HDL, LABVLDL, LDLCALC, CHOLHDL in the last 168 hours.  ?Hematology ?Recent Labs  ?Lab 01/12/22 ?0235 01/13/22 ?0245  ?WBC 16.9* 10.0  ?RBC 4.15 4.22  ?HGB 10.8* 11.1*  ?HCT 34.4* 35.3*  ?MCV 82.9 83.6  ?MCH 26.0 26.3  ?MCHC 31.4 31.4  ?RDW 18.2* 18.4*  ?PLT 435* 472*  ? ?Thyroid  ?Recent Labs  ?Lab 01/13/22 ?0245  ?  TSH 1.743  ?  ?BNP ?Recent Labs  ?Lab 01/12/22 ?0235  ?BNP 129.0*  ?  ?DDimer  ?Recent Labs  ?Lab 01/12/22 ?0203  ?DDIMER 1.52*  ?  ? ?Radiology  ?  ?NM Pulmonary Perfusion ? ?Result Date: 01/12/2022 ?CLINICAL DATA:  Chest pain, shortness of breath, positive D-dimer EXAM: NUCLEAR MEDICINE PERFUSION LUNG SCAN TECHNIQUE: Perfusion images were obtained in multiple projections after intravenous injection of radiopharmaceutical. Ventilation scans intentionally deferred if perfusion scan and chest x-ray adequate for interpretation during COVID 19 epidemic. RADIOPHARMACEUTICALS:  4.4 mCi Tc-60m MAA IV COMPARISON:  Chest radiographs done earlier today FINDINGS: There small subsegmental foci of decreased uptake in the posterior left mid and left lower lung fields seen in the left posterior oblique projection. No other focal abnormality is seen. IMPRESSION: Imaging findings suggest low probability for pulmonary embolism. Electronically Signed   By: Elmer Picker M.D.   On: 01/12/2022 12:02  ? ?DG Chest Portable 1 View ? ?Result Date:  01/12/2022 ?CLINICAL DATA:  Shortness of breath. EXAM: PORTABLE CHEST 1 VIEW COMPARISON:  Chest x-ray 01/23/2021. FINDINGS: Patient is status post cardiac surgery and valve replacement. Cardiac silhouette is enlarged. There are diffuse bilateral infiltrates throughout the mid and lower lungs. There are likely small bilateral pleural effusions. There is no pneumothorax or acute fracture. IMPRESSION: 1. Diffuse bilateral airspace opacities may represent pulmonary edema and or infection. 2. Small pleural effusions. 3. Stable cardiomegaly. Electronically Signed   By: Ronney Asters M.D.   On: 01/12/2022 02:40   ? ?Cardiac Studies  ? ? ? ?Patient Profile  ?   ?41 y.o. female with a hx of chronic diastolic HF, mechanical MV, pulm HTN  who is consulted for shortness of breath ? ?Assessment & Plan  ?  ?Acute on chronic diastolic HF ?- mildly elevated BNP 129.  PCT <0.1 suggest no evidence of infection ?- mixed compliant with home diuretic ?- she is on IV lasix 40mg  bid, continue today ?- echo pending ?   ?Mitral valve disease ?- history of MV repair in [place] in [place] ?- 03/2019 MV replacement with carbomedics mechanical valve.  ?- INR 1.1 on admission, started on hep gtt. Remains on ASA. She was noncompliant with coumadin at home.  ?- echo pending ? ?Atrial flutter: EKG with 2:1 atrial flutter ?-Will be difficult to rate control atrial flutter, recommend cardioversion.  She is on warfarin but has been subtherapeutic.  Currently on heparin drip.  Recommend TEE/DCCV, will tentatively plan for tomorrow ?-After careful review of history and examination, the risks and benefits of transesophageal echocardiogram have been explained including risks of esophageal damage, perforation (1:10,000 risk), bleeding, pharyngeal hematoma as well as other potential complications associated with conscious sedation including aspiration, arrhythmia, respiratory failure and death. Alternatives to treatment were discussed, questions were answered. Patient  is willing to proceed.  ? ?Pulmonary HTN ?- RHC 7/20 showed pulmonary venous hypertension and mildly elevate PCWP, so she does not appear to have a significant component of PAH from sarcoidosis causing RV failure ?  ?Suspected systemic sarcoid ?- cMRI did not show cardiac involvement ?  ?Cough/elevated WBC/abnormal CXR ?-Procalcitonin negative ? ? ?For questions or updates, please contact [place] ?Please consult [email or web address] for contact info under  ? ?  ?   ?Signed, ?Donato Heinz, MD  ?01/13/2022, 8:36 AM   ? ?

## 2022-01-13 NOTE — TOC Initial Note (Signed)
Transition of Care (TOC) - Initial/Assessment Note  ? ? ?Patient Details  ?Name: Theresa Crawford ?MRN: [id number] ?Date of Birth: [birth year] ? ?Transition of Care (TOC) CM/SW Contact:    ?Boneta Lucks, RN ?Phone Number: ?01/13/2022, 4:15 PM ? ?Clinical Narrative:    Patient admitted with acute CHF. TOC consulted for no PCP, no Insurance, CHF. CM at the bedside to explain Care connect program. Patient is agreeable. Referral made and cell number updated for them to call and make an appointment.  Patient advised to check with social services after discharge to  see if she is eligible for disability. She has been unable to work. TOC confirmed she is on [employer] list for medicaid screening.  ? ?Patient given Living Better with CHF book and explained. She will review for questions to ask the doctor.              ? ? ?Expected Discharge Plan: Home/Self Care ?Barriers to Discharge: Continued Medical Work up ? ? ?Patient Goals and CMS Choice ?Patient states their goals for this hospitalization and ongoing recovery are:: to go home. ?CMS Medicare.gov Compare Post Acute Care list provided to:: Patient ?Choice offered to / list presented to : Patient ? ?Expected Discharge Plan and Services ?Expected Discharge Plan: Home/Self Care ?  ?   ?Living arrangements for the past 2 months: [place] ?                ?  ?Prior Living Arrangements/Services ?Living arrangements for the past 2 months: [place] ?Lives with:: Parents ?Patient language and need for interpreter reviewed:: Yes ?       ?Need for Family Participation in Patient Care: Yes (Comment) ?Care giver support system in place?: Yes (comment) ?  ?Criminal Activity/Legal Involvement Pertinent to Current Situation/Hospitalization: No - Comment as needed ? ?Activities of Daily Living ?Home Assistive Devices/Equipment: None ?ADL Screening (condition at time of admission) ?Patient's cognitive ability adequate to safely complete daily activities?: Yes ?Is the  patient deaf or have difficulty hearing?: No ?Does the patient have difficulty seeing, even when wearing glasses/contacts?: No ?Does the patient have difficulty concentrating, remembering, or making decisions?: No ?Patient able to express need for assistance with ADLs?: Yes ?Does the patient have difficulty dressing or bathing?: No ?Independently performs ADLs?: Yes (appropriate for developmental age) ?Does the patient have difficulty walking or climbing stairs?: No ?Weakness of Legs: None ?Weakness of Arms/Hands: None ? ?    ?Emotional Assessment ?  ?   ?Orientation: : Oriented to Self, Oriented to Place, Oriented to  Time, Oriented to Situation ?Alcohol / Substance Use: Not Applicable ?Psych Involvement: No (comment) ? ?Admission diagnosis:  Acute pulmonary edema ([place]) [J81.0] ?CHF exacerbation ([employer]) [I50.9] ?Acute respiratory failure with hypoxia ([place]) [J96.01] ?History of heart valve replacement with mechanical valve [Z95.2] ?Subtherapeutic anticoagulation [Z51.81, Z79.01] ?Patient Active Problem List  ? Diagnosis Date Noted  ? Atrial flutter with rapid ventricular response ([place])   ? Acute HFrEF 01/12/2022  ? Subtherapeutic international normalized ratio (INR) 01/12/2022  ? History of heart valve replacement with mechanical valve 04/05/2019  ? S/P tricuspid valve repair 04/05/2019  ? Obesity (BMI 30-39.9) 03/29/2019  ? Sarcoidosis   ? Acute on chronic diastolic congestive heart failure ([place]) 03/24/2019  ? Normocytic anemia   ? Dysuria 03/19/2019  ? Acute CHF (congestive heart failure) ([place]) 03/04/2019  ? MDD (major depressive disorder), recurrent episode ([place]) 04/05/2018  ? S/P tooth extraction 04/03/2018  ? Suicidal ideation 04/03/2018  ?  MDD (major depressive disorder)   ? Chronic diastolic congestive heart failure ([place])   ? Tricuspid regurgitation   ? Hypomagnesemia 03/07/2018  ? Tobacco abuse   ? Dyspnea 01/11/2018  ? Prolonged QT interval 09/10/2017  ? Normochromic normocytic anemia   ? Pulmonary edema  07/05/2017  ? Hypokalemia 06/12/2017  ? Pulmonary hypertension ([place]) 06/12/2017  ? Flash pulmonary edema ([place]) 06/11/2017  ? Depression with anxiety 06/11/2017  ? Recurrent mitral valve stenosis and regurgitation s/p mitral valve repair   ? Acute pulmonary edema ([place]) 10/10/2016  ? CAP (community acquired pneumonia) 05/25/2016  ? Leukocytosis 05/24/2016  ? Acute respiratory failure with hypoxia ([place]) 05/23/2016  ? Acute on chronic diastolic CHF (congestive heart failure) ([place]) 05/23/2016  ? Cough with hemoptysis 05/23/2016  ? Postpartum complication pericarditis in 2009 with eventual needing MVR in 2015 05/23/2016  ? Anemia, iron deficiency 05/23/2016  ? Hypertension   ? SOB (shortness of breath)   ? Respiratory distress 02/16/2016  ? Essential hypertension 02/16/2016  ? S/P mitral valve repair 03/27/2013  ? ?PCP:  Kerin Perna, NP ?Pharmacy:   ?[facility] [place] [id number] ?[address] ?[place] [address] ?Phone: [phone] Fax: [phone] ? ?[facility], [address] ?[place] [address] ?Phone: [phone] Fax: [phone] ? ? ? ?Readmission Risk Interventions ? ?  01/13/2022  ?  4:12 PM  ?Readmission Risk Prevention Plan  ?Transportation Screening Complete  ?PCP or Specialist Appt within 5-7 Days Complete  ?Home Care Screening Complete  ?Medication Review (RN CM) Complete  ? ? ? ?

## 2022-01-13 NOTE — Progress Notes (Signed)
?PROGRESS NOTE ? ? ? ? ?Theresa Crawford, is a 41 y.o. female, DOB - 1981/09/02, [id number] ? ?Admit date - 01/12/2022   Admitting Physician Asia B Zierle-Ghosh, DO ? ?Outpatient Primary MD for the patient is Grayce Sessions, NP ? ?LOS - 1 ? ?Chief Complaint  ?Patient presents with  ? Shortness of Breath  ?    ? ? ?Brief Narrative:  ? ? ?41 y.o. female with medical history significant of bipolar disorder, chronic CHF, depression, GERD, heart murmur, palpitations, pulmonary hypertension, recurrent mitral valve stenosis and regurgitation status post mitral valve repair, tobacco use disorder, and more presents the ED with a chief complaint of shortness of breath and chest tightness.  Patient reports that started the night prior to presentation and continued to get worse throughout the day.  She reports that her associated chest pain is like a tightness.  She has associated palpitations, diaphoresis, headache, nausea.  Patient denies vomiting.  Patient reports that since she can hear her mechanical valve take she knows that her heart is racing and it was going very fast earlier today.  Patient reports productive cough but no fever.  Patient admits to constipation and reports that she has to strain to have a bowel movement patient has been on her medications for quite some time and is very careful about not having a doctor or access to her medications. ?  ?Patient reports she does smoke, but does not want a nicotine patch.  She does not drink alcohol, she does not use illicit drugs.  She is not vaccinated for COVID.  Patient is full code. ? ?  ?-Assessment and Plan: ? ? Acute on chronic combined systolic and diastolic dysfunction CHF exacerbation ?--continue 40 mg IV Lasix twice daily ?Last Echo June 2020 shows an EF of 45-50% with reduced right ventricle systolic function  ?Repeat TT echo from 01/13/2022 with EF of 50 to 55%, no regional wall motion abnormalities, mechanical trivial valve noted with findings suggestive  of stenosis of the mitral prosthesis ?-TEE on 01/14/2022 pending ?-Cardiology consult appreciated ?-Continue daily weights, fluid input and output ? ? ?Subtherapeutic international normalized ratio/mechanical mitral valve ?[place] of [place] in [place] in [place] ?- 03/2019 MV replacement with carbomedics mechanical valve.  ?- Mechanical mitral valve INR goal 2.5-3.5 ?-INR subtherapeutic, continue heparin bridge and Coumadin  ? ?Atrial flutter: EKG with 2:1 atrial flutter- ?Continue IV heparin bridge and Coumadin ?-Plans for TEE with cardioversion on 01/14/2022 ? ?MDD (major depressive disorder), recurrent episode (HCC) ?- Patient has been out of all of her meds ?Restart lowest dose of Zoloft -patient will likely need to be titrated back up in the outpatient setting ?-Continue BuSpar for anxiety ?-Continue Atarax for anxiety ?-Continue Seroquel for insomnia ?-Continue to monitor ? ?Tobacco abuse ?Reports smoking less than half a pack a day  ?-counseled on cessation ?-Patient declines nicotine patch at this time ? ? ?Disposition/Need for in-Hospital Stay- patient unable to be discharged at this time due to --- acute CHF exacerbation in the setting of noncompliance with medication requiring diuresis and TEE with cardioversion for A-fib/a flutter ? ?Status is: Inpatient  ? ?Disposition: The patient is from: Home ?             Anticipated d/c is to: Home ?             Anticipated d/c date is: > 3 days ?             Patient currently is not medically stable  to d/c. ?Barriers: Not Clinically Stable-  ? ?Code Status :  -  Code Status: Full Code  ? ?Family Communication:    NA (patient is alert, awake and coherent)  ? ?DVT Prophylaxis  :   - SCDs   SCDs Start: 01/12/22 0728 ?warfarin (COUMADIN) tablet 7.5 mg  ? ?Lab Results  ?Component Value Date  ? PLT 472 (H) 01/13/2022  ? ? ?Inpatient Medications ? ?Scheduled Meds: ? aspirin EC  81 mg Oral Daily  ? budesonide (PULMICORT) nebulizer solution  0.25 mg Nebulization BID  ? busPIRone   7.5 mg Oral TID  ? Chlorhexidine Gluconate Cloth  6 each Topical Q0600  ? furosemide  40 mg Intravenous BID  ? metoprolol tartrate  25 mg Oral TID  ? polyethylene glycol  17 g Oral Daily  ? QUEtiapine  50 mg Oral QHS  ? sertraline  25 mg Oral Daily  ? spironolactone  25 mg Oral Daily  ? warfarin  7.5 mg Oral q1600  ? Warfarin - Pharmacist Dosing Inpatient   Does not apply q1600  ? ?Continuous Infusions: ? heparin 1,750 Units/hr (01/13/22 1625)  ? ?PRN Meds:.acetaminophen **OR** acetaminophen, albuterol, docusate sodium, hydrOXYzine, morphine injection, ondansetron **OR** ondansetron (ZOFRAN) IV, oxyCODONE ? ? ?Anti-infectives (From admission, onward)  ? ? None  ? ?  ? ?  ? ?Subjective: ?Theresa Crawford today has no fevers, no emesis,  No chest pain,   ?-Shortness of breath persist ?-Sleepy and resting after hydroxyzine given ? ? ?Objective: ?Vitals:  ? 01/13/22 0800 01/13/22 1149 01/13/22 1200 01/13/22 1600  ?BP:   121/71   ?Pulse:   (!) 113 (!) 115  ?Resp:   19 20  ?Temp: 97.7 ?F (36.5 ?C) 98.5 ?F (36.9 ?C)  97.8 ?F (36.6 ?C)  ?TempSrc: Oral Oral  Oral  ?SpO2:   97% 96%  ?Weight:      ?Height:      ? ? ?Intake/Output Summary (Last 24 hours) at 01/13/2022 1839 ?Last data filed at 01/13/2022 1625 ?Gross per 24 hour  ?Intake 386.09 ml  ?Output --  ?Net 386.09 ml  ? ?Filed Weights  ? 01/12/22 0227 01/12/22 0804 01/13/22 0325  ?Weight: 109 kg 104.9 kg 104.5 kg  ? ? ?Physical Exam ? ?Gen:-More sleepy after hydroxyzine ?HEENT:- [place].AT, No sclera icterus ?Neck-Supple Neck,No JVD,.  ?Lungs-  CTAB , fair symmetrical air movement ?CV- S1, S2 normal, regular , metallic valve click ?Abd-  +ve B.Sounds, Abd Soft, No tenderness,    ?Extremity/Skin:- +ve  edema, pedal pulses present  ?Psych-affect is appropriate, oriented x3 ?Neuro-generalized weakness, no new focal deficits, no tremors ? ?Data Reviewed: I have personally reviewed following labs and imaging studies ? ?CBC: ?Recent Labs  ?Lab 01/12/22 ?0235 01/13/22 ?0245  ?WBC 16.9* 10.0   ?NEUTROABS 13.2* 5.7  ?HGB 10.8* 11.1*  ?HCT 34.4* 35.3*  ?MCV 82.9 83.6  ?PLT 435* 472*  ? ?Basic Metabolic Panel: ?Recent Labs  ?Lab 01/12/22 ?0235 01/13/22 ?0245  ?NA 137 138  ?K 4.2 4.4  ?CL 108 108  ?CO2 22 24  ?GLUCOSE 122* 123*  ?BUN 19 17  ?CREATININE 0.91 0.86  ?CALCIUM 8.6* 8.7*  ?MG  --  2.3  ? ?GFR: ?Estimated Creatinine Clearance: 100.4 mL/min (by C-G formula based on SCr of 0.86 mg/dL). ?Liver Function Tests: ?Recent Labs  ?Lab 01/12/22 ?0235 01/13/22 ?0245  ?AST 30 22  ?ALT 32 26  ?ALKPHOS 69 64  ?BILITOT 0.5 0.4  ?PROT 8.2* 7.8  ?ALBUMIN 3.9 3.7  ? ?  Cardiac Enzymes: ?No results for input(s): CKTOTAL, CKMB, CKMBINDEX, TROPONINI in the last 168 hours. ?BNP (last 3 results) ?No results for input(s): PROBNP in the last 8760 hours. ?HbA1C: ?No results for input(s): HGBA1C in the last 72 hours. ?Sepsis Labs: ?@LABRCNTIP (procalcitonin:4,lacticidven:4) ?) ?Recent Results (from the past 240 hour(s))  ?MRSA Next Gen by PCR, Nasal     Status: None  ? Collection Time: 01/12/22  7:38 AM  ? Specimen: Nasal Mucosa; Nasal Swab  ?Result Value Ref Range Status  ? MRSA by PCR Next Gen NOT DETECTED NOT DETECTED Final  ?  Comment: (NOTE) ?The GeneXpert MRSA Assay (FDA approved for NASAL specimens only), ?is one component of a comprehensive MRSA colonization surveillance ?program. It is not intended to diagnose MRSA infection nor to guide ?or monitor treatment for MRSA infections. ?Test performance is not FDA approved in patients less than 2 years ?old. ?Performed at [facility], [address] [place], [place] ?  ?  ? ?Radiology Studies: ?NM Pulmonary Perfusion ? ?Result Date: 01/12/2022 ?CLINICAL DATA:  Chest pain, shortness of breath, positive D-dimer EXAM: NUCLEAR MEDICINE PERFUSION LUNG SCAN TECHNIQUE: Perfusion images were obtained in multiple projections after intravenous injection of radiopharmaceutical. Ventilation scans intentionally deferred if perfusion scan and chest x-ray adequate for  interpretation during COVID 19 epidemic. RADIOPHARMACEUTICALS:  4.4 mCi Tc-49m MAA IV COMPARISON:  Chest radiographs done earlier today FINDINGS: There small subsegmental foci of decreased uptake in the posterior l

## 2022-01-13 NOTE — Progress Notes (Signed)
ANTICOAGULATION CONSULT NOTE - Follow Up Consult ? ?Pharmacy Consult for heparin and warfarin ?Indication:  MVR ? ?Allergies  ?Allergen Reactions  ? Bee Venom Anaphylaxis  ? Lisinopril Anaphylaxis, Shortness Of Breath, Swelling and Other (See Comments)  ?  Throat swells  ? Penicillins Shortness Of Breath, Swelling and Other (See Comments)  ?  Has patient had a PCN reaction causing immediate rash, facial/tongue/throat swelling, SOB or lightheadedness with hypotension: Yes ?Has patient had a PCN reaction causing severe rash involving mucus membranes or skin necrosis: Yes ?Has patient had a PCN reaction that required hospitalization No ?Has patient had a PCN reaction occurring within the last 10 years: No ?If all of the above answers are "NO", then may proceed with Cephalosporin use. ?  ? Perflutren Lipid Microsphere Shortness Of Breath and Other (See Comments)  ?  Chest Pain/Tightness, also  ? Shellfish Allergy Anaphylaxis  ? Contrast Media [Iodinated Contrast Media] Hives, Itching and Other (See Comments)  ?  Itching and hives to throat, neck, face and arms.   No difficulty breathing.   This was given at [facility] approximately 2015. Contrast Dye  ? ? ?Patient Measurements: ?Height: 5\' 4"  (162.6 cm) ?Weight: 104.5 kg (230 lb 6.1 oz) ?IBW/kg (Calculated) : 54.7 ?Heparin Dosing Weight: 80kg ? ?Vital Signs: ?Temp: 98.5 ?F (36.9 ?C) (05/02 2000) ?Temp Source: Oral (05/02 2000) ?Pulse Rate: 115 (05/02 2100) ? ?Labs: ?Recent Labs  ?  01/12/22 ?0235 01/12/22 ?0438 01/12/22 ?1136 01/12/22 ?1912 01/13/22 ?0245  ?HGB 10.8*  --   --   --  11.1*  ?HCT 34.4*  --   --   --  35.3*  ?PLT 435*  --   --   --  472*  ?LABPROT 14.2  --   --   --   --   ?INR 1.1  --   --   --   --   ?HEPARINUNFRC  --   --  0.12* 0.28* 0.52  ?CREATININE 0.91  --   --   --  0.86  ?TROPONINIHS 5 5  --   --   --   ? ? ? ?Estimated Creatinine Clearance: 100.4 mL/min (by C-G formula based on SCr of 0.86 mg/dL). ? ? ?Medical History: ?Past  Medical History:  ?Diagnosis Date  ? Anxiety   ? Arthritis   ? "lower back" (04/04/2018)  ? Bipolar disorder ([place])   ? Chronic bronchitis ([place])   ? Chronic diastolic CHF (congestive heart failure) ([place])   ? Chronic lower back pain   ? DDD (degenerative disc disease), lumbar   ? Depression   ? Dyspnea   ? GERD (gastroesophageal reflux disease)   ? Headache   ? "weekly" (04/04/2018), miragrains in past (04/03/2019)  ? Heart murmur   ? Hypertension   ? Mitral valve disease   ? Normocytic anemia   ? Obesity   ? Palpitations   ? Pericarditis   ? Pneumonia   ? Premature atrial contractions   ? Pulmonary hypertension ([place])   ? PVC's (premature ventricular contractions)   ? a. h/o palpitations with event monitor in 03/2017 showing NSR with PACs/PVCs.  ? Recurrent mitral valve stenosis and regurgitation s/p mitral valve repair   ? S/P mitral valve repair 03/27/2013  ? Dr. Fransico Michael - [place], [place] - complex valvuloplasty including resuspension of entire posterior leaflet using Gore-tex neochords and 30 mm Edwards [employer]io ring annuloplasty  ? S/P redo mitral valve replacement with mechanical valve 04/05/2019  ? 29 mm  Sorin [employer] Optiform bileaflet mechanical valve  ? S/P tricuspid valve repair 04/05/2019  ? 28 mm Edwards mc3 ring annuloplasty  ? Tobacco abuse   ? Tricuspid regurgitation   ? ? ?Assessment: ?41yo female c/o SOB, admitted for acute pulmonary edema, to start heparin for MVR; of note pt is well known to be noncompliant with Coumadin, unclear if she is currently seeing a Coumadin clinic but was last seen in [facility] Coumad[facility] clinic in 2020 and was discharged from that clinic in 2022, no other AC notes in care everywhere, pt states she is out of Coumadin, INR at baseline. ? ?Home dose of warfarin was 5mg  daily, patient non compliant due to running out of medication. Admit INR 1.1  ? ?HL 0.52, therapeutic  ? ?Goal of Therapy:  ?INR goal 2.5-3.5  ?Heparin level 0.3-0.7 units/ml ?Monitor platelets by anticoagulation  protocol: Yes ?  ?Plan:  ?Warfarin 7.5mg  daily  ?Continue heparin infusion at 1750 units/hr. ?Monitor heparin levels in 6 hours and CBC daily. ? ?Thomasenia Sales, PharmD, MBA ?Clinical Pharmacist ? ? ?01/13/2022,7:12 AM ? ? ?

## 2022-01-14 ENCOUNTER — Encounter (HOSPITAL_COMMUNITY): Payer: Self-pay | Admitting: Family Medicine

## 2022-01-14 ENCOUNTER — Inpatient Hospital Stay (HOSPITAL_COMMUNITY): Payer: Self-pay | Admitting: Certified Registered"

## 2022-01-14 ENCOUNTER — Other Ambulatory Visit: Payer: Self-pay

## 2022-01-14 ENCOUNTER — Inpatient Hospital Stay (HOSPITAL_COMMUNITY): Payer: Self-pay

## 2022-01-14 ENCOUNTER — Encounter (HOSPITAL_COMMUNITY)
Admission: EM | Disposition: A | Discharge status: Home / Self Care | Payer: Self-pay | Source: Home / Self Care | Attending: Family Medicine

## 2022-01-14 DIAGNOSIS — Z6839 Body mass index (BMI) 39.0-39.9, adult: Secondary | ICD-10-CM

## 2022-01-14 DIAGNOSIS — I4892 Unspecified atrial flutter: Secondary | ICD-10-CM

## 2022-01-14 DIAGNOSIS — I340 Nonrheumatic mitral (valve) insufficiency: Secondary | ICD-10-CM

## 2022-01-14 DIAGNOSIS — I10 Essential (primary) hypertension: Secondary | ICD-10-CM

## 2022-01-14 HISTORY — PX: TEE WITHOUT CARDIOVERSION: SHX5443

## 2022-01-14 HISTORY — PX: CARDIOVERSION: SHX1299

## 2022-01-14 LAB — CBC
HCT: 38 % (ref 36.0–46.0)
Hemoglobin: 11.4 g/dL — ABNORMAL LOW (ref 12.0–15.0)
MCH: 25.2 pg — ABNORMAL LOW (ref 26.0–34.0)
MCHC: 30 g/dL (ref 30.0–36.0)
MCV: 83.9 fL (ref 80.0–100.0)
Platelets: 509 10*3/uL — ABNORMAL HIGH (ref 150–400)
RBC: 4.53 MIL/uL (ref 3.87–5.11)
RDW: 18 % — ABNORMAL HIGH (ref 11.5–15.5)
WBC: 10.2 10*3/uL (ref 4.0–10.5)
nRBC: 0 % (ref 0.0–0.2)

## 2022-01-14 LAB — POCT PREGNANCY, URINE: Preg Test, Ur: NEGATIVE

## 2022-01-14 LAB — PROTIME-INR
INR: 1.1 (ref 0.8–1.2)
Prothrombin Time: 13.8 seconds (ref 11.4–15.2)

## 2022-01-14 LAB — HEPARIN LEVEL (UNFRACTIONATED): Heparin Unfractionated: 0.54 IU/mL (ref 0.30–0.70)

## 2022-01-14 SURGERY — CARDIOVERSION
Anesthesia: General

## 2022-01-14 MED ORDER — LIDOCAINE VISCOUS HCL 2 % MT SOLN
OROMUCOSAL | Status: AC
  Filled 2022-01-14: qty 15

## 2022-01-14 MED ORDER — LACTATED RINGERS IV SOLN: INTRAVENOUS | Status: DC | PRN

## 2022-01-14 MED ORDER — PHENYLEPHRINE 80 MCG/ML (10ML) SYRINGE FOR IV PUSH (FOR BLOOD PRESSURE SUPPORT)
PREFILLED_SYRINGE | INTRAVENOUS | Status: DC | PRN
  Administered 2022-01-14 (×2): 80 ug via INTRAVENOUS

## 2022-01-14 MED ORDER — LACTATED RINGERS IV SOLN: INTRAVENOUS | Status: DC

## 2022-01-14 MED ORDER — PROPOFOL 10 MG/ML IV BOLUS
INTRAVENOUS | Status: DC | PRN
  Administered 2022-01-14: 20 mg via INTRAVENOUS
  Administered 2022-01-14: 60 mg via INTRAVENOUS
  Administered 2022-01-14: 20 mg via INTRAVENOUS
  Administered 2022-01-14: 30 mg via INTRAVENOUS
  Administered 2022-01-14: 50 mg via INTRAVENOUS
  Administered 2022-01-14: 20 mg via INTRAVENOUS
  Administered 2022-01-14: 70 mg via INTRAVENOUS
  Administered 2022-01-14: 50 mg via INTRAVENOUS

## 2022-01-14 MED ORDER — PROPOFOL 10 MG/ML IV BOLUS
INTRAVENOUS | Status: AC
  Filled 2022-01-14: qty 20

## 2022-01-14 MED ORDER — LIDOCAINE 2% (20 MG/ML) 5 ML SYRINGE
INTRAMUSCULAR | Status: DC | PRN
  Administered 2022-01-14: 50 mg via INTRAVENOUS

## 2022-01-14 MED ORDER — HYDROXYZINE HCL 25 MG PO TABS
50.0000 mg | ORAL_TABLET | Freq: Three times a day (TID) | ORAL | Status: DC | PRN
  Administered 2022-01-14 – 2022-01-16 (×5): 50 mg via ORAL
  Filled 2022-01-14 (×2): qty 2

## 2022-01-14 MED ORDER — WARFARIN SODIUM 7.5 MG PO TABS
12.5000 mg | ORAL_TABLET | Freq: Once | ORAL | Status: AC
  Administered 2022-01-14: 12.5 mg via ORAL
  Filled 2022-01-14: qty 1

## 2022-01-14 NOTE — H&P (Signed)
Procedure H&P ? ?Please see referenced rounding note below. Plan for TEE/DCCV today in setting of atrial flutter ? ? ?Theresa Rich MD ? ? ? ?Progress Note ?  ?Patient Name: Theresa Crawford ?Date of Encounter: 01/14/2022 ?  ?[facility] Cardiologist: Prentice Docker, MD (Inactive)  ?  ?Subjective  ?  ?Some ongoing SOB ?  ?Inpatient Medications  ?  ?Scheduled Meds: ? aspirin EC  81 mg Oral Daily  ? budesonide (PULMICORT) nebulizer solution  0.25 mg Nebulization BID  ? busPIRone  7.5 mg Oral TID  ? Chlorhexidine Gluconate Cloth  6 each Topical Q0600  ? furosemide  40 mg Intravenous BID  ? mouth rinse  15 mL Mouth Rinse BID  ? metoprolol tartrate  25 mg Oral TID  ? polyethylene glycol  17 g Oral Daily  ? QUEtiapine  50 mg Oral QHS  ? sertraline  25 mg Oral Daily  ? spironolactone  25 mg Oral Daily  ? warfarin  12.5 mg Oral ONCE-1600  ? Warfarin - Pharmacist Dosing Inpatient   Does not apply q1600  ?  ?Continuous Infusions: ? heparin 1,750 Units/hr (01/14/22 0600)  ?  ?PRN Meds: ?acetaminophen **OR** acetaminophen, albuterol, docusate sodium, hydrOXYzine, morphine injection, ondansetron **OR** ondansetron (ZOFRAN) IV, oxyCODONE  ?  ?Vital Signs  ?  ?      ?Vitals:  ?  01/14/22 0300 01/14/22 0400 01/14/22 0500 01/14/22 0600  ?BP: 109/82 122/75 116/86 120/78  ?Pulse: (!) 111 (!) 112   (!) 112  ?Resp: 20 18 18 15   ?Temp:          ?TempSrc:          ?SpO2: 96% 92%   95%  ?Weight:     103.3 kg    ?Height:          ?  ?  ?Intake/Output Summary (Last 24 hours) at 01/14/2022 0820 ?Last data filed at 01/14/2022 0600 ?   ?Gross per 24 hour  ?Intake 967.88 ml  ?Output 550 ml  ?Net 417.88 ml  ?  ?  ?  01/14/2022  ?  5:00 AM 01/13/2022  ?  3:25 AM 01/12/2022  ?  8:04 AM  ?Last 3 Weights  ?Weight (lbs) 227 lb 11.8 oz 230 lb 6.1 oz 231 lb 4.2 oz  ?Weight (kg) 103.3 kg 104.5 kg 104.9 kg  ?   ?  ?Telemetry  ?  ?Aflutter 110s - Personally Reviewed ?  ?ECG  ?  ?N/a - Personally Reviewed ?  ?Physical Exam  ?  ?GEN: No acute distress.   ?Neck:  No JVD ?Cardiac: tachy,regular, mechanical S2 ?Respiratory:faint crackles bilateral bases ?GI: Soft, nontender, non-distended  ?MS: No edema; No deformity. ?Neuro:  Nonfocal  ?Psych: Normal affect  ?  ?Labs  ?  ?High Sensitivity Troponin:   ?Last Labs   ?    ?Recent Labs  ?Lab 01/12/22 ?0235 01/12/22 ?0438  ?TROPONINIHS 5 5  ?  ?   ?Chemistry ?Last Labs   ?    ?Recent Labs  ?Lab 01/12/22 ?0235 01/13/22 ?0245  ?NA 137 138  ?K 4.2 4.4  ?CL 108 108  ?CO2 22 24  ?GLUCOSE 122* 123*  ?BUN 19 17  ?CREATININE 0.91 0.86  ?CALCIUM 8.6* 8.7*  ?MG  --  2.3  ?PROT 8.2* 7.8  ?ALBUMIN 3.9 3.7  ?AST 30 22  ?ALT 32 26  ?ALKPHOS 69 64  ?BILITOT 0.5 0.4  ?GFRNONAA >60 >60  ?ANIONGAP 7 6  ?  ?  ?Lipids  ?  Last Labs   ?No results for input(s): CHOL, TRIG, HDL, LABVLDL, LDLCALC, CHOLHDL in the last 168 hours.  ?  ?Hematology ?Last Labs   ?     ?Recent Labs  ?Lab 01/12/22 ?0235 01/13/22 ?6237 01/14/22 ?0353  ?WBC 16.9* 10.0 10.2  ?RBC 4.15 4.22 4.53  ?HGB 10.8* 11.1* 11.4*  ?HCT 34.4* 35.3* 38.0  ?MCV 82.9 83.6 83.9  ?MCH 26.0 26.3 25.2*  ?MCHC 31.4 31.4 30.0  ?RDW 18.2* 18.4* 18.0*  ?PLT 435* 472* 509*  ?  ?  ?Thyroid  ?Last Labs   ?   ?Recent Labs  ?Lab 01/13/22 ?0245  ?TSH 1.743  ?  ?  ?BNP ?Last Labs   ?   ?Recent Labs  ?Lab 01/12/22 ?0235  ?BNP 129.0*  ?  ?  ?DDimer  ?Last Labs   ?   ?Recent Labs  ?Lab 01/12/22 ?0203  ?DDIMER 1.52*  ?  ?  ?  ?Radiology  ?  ? ?Imaging Results (Last 48 hours)  ?DG Chest 2 View ?  ?Result Date: 01/14/2022 ?CLINICAL DATA:  Shortness of breath. EXAM: CHEST - 2 VIEW COMPARISON:  Jan 12, 2022. FINDINGS: Stable cardiomegaly with mild central pulmonary vascular congestion. Decreased lung opacities are noted most consistent with improving pulmonary edema. Status post cardiac valve repair. Bony thorax is unremarkable. IMPRESSION: Stable cardiomegaly with central pulmonary vascular congestion. Probable improving bilateral pulmonary edema is noted. Electronically Signed   By: Lupita Raider M.D.   On: 01/14/2022  08:11  ?  ?NM Pulmonary Perfusion ?  ?Result Date: 01/12/2022 ?CLINICAL DATA:  Chest pain, shortness of breath, positive D-dimer EXAM: NUCLEAR MEDICINE PERFUSION LUNG SCAN TECHNIQUE: Perfusion images were obtained in multiple projections after intravenous injection of radiopharmaceutical. Ventilation scans intentionally deferred if perfusion scan and chest x-ray adequate for interpretation during COVID 19 epidemic. RADIOPHARMACEUTICALS:  4.4 mCi Tc-62m MAA IV COMPARISON:  Chest radiographs done earlier today FINDINGS: There small subsegmental foci of decreased uptake in the posterior left mid and left lower lung fields seen in the left posterior oblique projection. No other focal abnormality is seen. IMPRESSION: Imaging findings suggest low probability for pulmonary embolism. Electronically Signed   By: Ernie Avena M.D.   On: 01/12/2022 12:02  ?  ?ECHOCARDIOGRAM COMPLETE ?  ?Result Date: 01/13/2022 ?   ECHOCARDIOGRAM REPORT   Patient Name:   Theresa Crawford Date of Exam: 01/13/2022 Medical Rec #:  [id number]       Height:       64.0 in Accession #:    [id number]      Weight:       230.4 lb Date of Birth:  1981-03-02       BSA:          2.078 m? Patient Age:    41 years        BP:           142/100 mmHg Patient Gender: F               HR:           114 bpm. [name] Procedure: 2D Echo, Cardiac Doppler and Color Doppler Indications:    CHF  History:        Patient has prior history of Echocardiogram examinations, most                 recent 03/05/2019. CHF, Pulmonary HTN, Signs/Symptoms:Shortness  of Breath; Risk Factors:Hypertension and Current Smoker. S/p MV                 Repair, TV ring 28mm [employer] Ring.  Sonographer:    Mikki Harbororothy Buchanan Referring Phys: [id number] ASIA B ZIERLE-GHOSH IMPRESSIONS  1. Left ventricular ejection fraction, by estimation, is 50 to 55%. The left ventricle has low normal function. The left ventricle has no regional wall motion abnormalities. There is  moderate left ventricular hypertrophy. Left ventricular diastolic parameters are indeterminate.  2. Right ventricular systolic function is moderately reduced. The right ventricular size is mildly enlarged. There is normal pulmonary artery systolic pressure. The estimated right ventricular systolic pressure is 26.0 mmHg.  3. Left atrial size was severely dilated.  4. There is a 29 mm mechanical valve present in the mitral position     Trivial mitral valve regurgitation. The mean mitral valve gradient is 9.0 mmHg with average heart rate of 114 bpm. MV 1.9 cm^2 by continuity equation. Echo findings are consistent with stenosis of the mitral prosthesis.  5. The tricuspid valve is status post repair with an annuloplasty ring. MG 4mmHg at 114 bpm  6. The aortic valve is tricuspid. Aortic valve regurgitation is not visualized. No aortic stenosis is present.  7. The inferior vena cava is normal in size with greater than 50% respiratory variability, suggesting right atrial pressure of 3 mmHg. FINDINGS  Left Ventricle: Left ventricular ejection fraction, by estimation, is 50 to 55%. The left ventricle has low normal function. The left ventricle has no regional wall motion abnormalities. The left ventricular internal cavity size was normal in size. There is moderate left ventricular hypertrophy. Left ventricular diastolic parameters are indeterminate. Right Ventricle: The right ventricular size is mildly enlarged. Right vetricular wall thickness was not well visualized. Right ventricular systolic function is moderately reduced. There is normal pulmonary artery systolic pressure. The tricuspid regurgitant velocity is 2.40 m/s, and with an assumed right atrial pressure of 3 mmHg, the estimated right ventricular systolic pressure is 26.0 mmHg. Left Atrium: Left atrial size was severely dilated. Right Atrium: Right atrial size was normal in size. Pericardium: There is no evidence of pericardial effusion. Mitral Valve: The mitral  valve has been repaired/replaced. Trivial mitral valve regurgitation. There is a 29 mm mechanical valve present in the mitral position. Echo findings are consistent with stenosis of the mitral prosthesis. MV

## 2022-01-14 NOTE — Anesthesia Preprocedure Evaluation (Signed)
Anesthesia Evaluation  ?Patient identified by MRN, date of birth, ID band ?Patient awake ? ? ? ?Reviewed: ?Allergy & Precautions, H&P , NPO status , Patient's Chart, lab work & pertinent test results, reviewed documented beta blocker date and time  ? ?Airway ?Mallampati: II ? ?TM Distance: >3 FB ?Neck ROM: full ? ? ? Dental ?no notable dental hx. ? ?  ?Pulmonary ?shortness of breath, Current Smoker and Patient abstained from smoking.,  ?  ?Pulmonary exam normal ?breath sounds clear to auscultation ? ? ? ? ? ? Cardiovascular ?Exercise Tolerance: Good ?hypertension, + Valvular Problems/Murmurs  ?Rhythm:regular Rate:Normal ? ? ?  ?Neuro/Psych ? Headaches, PSYCHIATRIC DISORDERS Anxiety Depression Bipolar Disorder   ? GI/Hepatic ?Neg liver ROS, GERD  Medicated,  ?Endo/Other  ?Morbid obesity ? Renal/GU ?negative Renal ROS  ?negative genitourinary ?  ?Musculoskeletal ? ? Abdominal ?(+) + obese,   ?Peds ? Hematology ? ?(+) Blood dyscrasia, anemia ,   ?Anesthesia Other Findings ?1. Left ventricular ejection fraction, by estimation, is 50 to 55%. The  ?left ventricle has low normal function. The left ventricle has no regional  ?wall motion abnormalities. There is moderate left ventricular hypertrophy.  ?Left ventricular diastolic  ?parameters are indeterminate.  ??2. Right ventricular systolic function is moderately reduced. The right  ?ventricular size is mildly enlarged. There is normal pulmonary artery  ?systolic pressure. The estimated right ventricular systolic pressure is  ?26.0 mmHg.  ??3. Left atrial size was severely dilated.  ??4. There is a 29 mm mechanical valve present in the mitral position  ??? ?Trivial mitral valve regurgitation. The mean mitral valve gradient is  ?9.0 mmHg with average heart rate of 114 bpm. MV 1.9 cm^2 by continuity  ?equation. Echo findings are consistent with stenosis of the mitral  ?prosthesis.  ??5. The tricuspid valve is status post repair with an  annuloplasty ring.  ?MG at 114 bpm  ??6. The aortic valve is tricuspid. Aortic valve regurgitation is not  ?visualized. No aortic stenosis is present.  ??7. The inferior vena cava is normal in size with greater than 50%  ?respiratory variability, suggesting right atrial pressure of 3 mmHg.  ? Reproductive/Obstetrics ?negative OB ROS ? ?  ? ? ? ? ? ? ? ? ? ? ? ? ? ?  ?  ? ? ? ? ? ? ? ? ?Anesthesia Physical ?Anesthesia Plan ? ?ASA: 4 ? ?Anesthesia Plan: General  ? ?Post-op Pain Management:   ? ?Induction:  ? ?PONV Risk Score and Plan: Propofol infusion ? ?Airway Management Planned:  ? ?Additional Equipment:  ? ?Intra-op Plan:  ? ?Post-operative Plan:  ? ?Informed Consent: I have reviewed the patients History and Physical, chart, labs and discussed the procedure including the risks, benefits and alternatives for the proposed anesthesia with the patient or authorized representative who has indicated his/her understanding and acceptance.  ? ? ? ?Dental Advisory Given ? ?Plan Discussed with: CRNA ? ?Anesthesia Plan Comments:   ? ? ? ? ? ? ?Anesthesia Quick Evaluation ? ?

## 2022-01-14 NOTE — Progress Notes (Signed)
? ?Progress Note ? ?Patient Name: Theresa Crawford ?Date of Encounter: 01/14/2022 ? ?[facility] Cardiologist: Prentice Docker, MD (Inactive)  ? ?Subjective  ? ?Some ongoing SOB ? ?Inpatient Medications  ?  ?Scheduled Meds: ? aspirin EC  81 mg Oral Daily  ? budesonide (PULMICORT) nebulizer solution  0.25 mg Nebulization BID  ? busPIRone  7.5 mg Oral TID  ? Chlorhexidine Gluconate Cloth  6 each Topical Q0600  ? furosemide  40 mg Intravenous BID  ? mouth rinse  15 mL Mouth Rinse BID  ? metoprolol tartrate  25 mg Oral TID  ? polyethylene glycol  17 g Oral Daily  ? QUEtiapine  50 mg Oral QHS  ? sertraline  25 mg Oral Daily  ? spironolactone  25 mg Oral Daily  ? warfarin  12.5 mg Oral ONCE-1600  ? Warfarin - Pharmacist Dosing Inpatient   Does not apply q1600  ? ?Continuous Infusions: ? heparin 1,750 Units/hr (01/14/22 0600)  ? ?PRN Meds: ?acetaminophen **OR** acetaminophen, albuterol, docusate sodium, hydrOXYzine, morphine injection, ondansetron **OR** ondansetron (ZOFRAN) IV, oxyCODONE  ? ?Vital Signs  ?  ?Vitals:  ? 01/14/22 0300 01/14/22 0400 01/14/22 0500 01/14/22 0600  ?BP: 109/82 122/75 116/86 120/78  ?Pulse: (!) 111 (!) 112  (!) 112  ?Resp: 20 18 18 15   ?Temp:      ?TempSrc:      ?SpO2: 96% 92%  95%  ?Weight:   103.3 kg   ?Height:      ? ? ?Intake/Output Summary (Last 24 hours) at 01/14/2022 0820 ?Last data filed at 01/14/2022 0600 ?Gross per 24 hour  ?Intake 967.88 ml  ?Output 550 ml  ?Net 417.88 ml  ? ? ?  01/14/2022  ?  5:00 AM 01/13/2022  ?  3:25 AM 01/12/2022  ?  8:04 AM  ?Last 3 Weights  ?Weight (lbs) 227 lb 11.8 oz 230 lb 6.1 oz 231 lb 4.2 oz  ?Weight (kg) 103.3 kg 104.5 kg 104.9 kg  ?   ? ?Telemetry  ?  ?Aflutter 110s - Personally Reviewed ? ?ECG  ?  ?N/a - Personally Reviewed ? ?Physical Exam  ? ?GEN: No acute distress.   ?Neck: No JVD ?Cardiac: tachy,regular, mechanical S2 ?Respiratory:faint crackles bilateral bases ?GI: Soft, nontender, non-distended  ?MS: No edema; No deformity. ?Neuro:  Nonfocal  ?Psych:  Normal affect  ? ?Labs  ?  ?High Sensitivity Troponin:   ?Recent Labs  ?Lab 01/12/22 ?0235 01/12/22 ?0438  ?TROPONINIHS 5 5  ?   ?Chemistry ?Recent Labs  ?Lab 01/12/22 ?0235 01/13/22 ?0245  ?NA 137 138  ?K 4.2 4.4  ?CL 108 108  ?CO2 22 24  ?GLUCOSE 122* 123*  ?BUN 19 17  ?CREATININE 0.91 0.86  ?CALCIUM 8.6* 8.7*  ?MG  --  2.3  ?PROT 8.2* 7.8  ?ALBUMIN 3.9 3.7  ?AST 30 22  ?ALT 32 26  ?ALKPHOS 69 64  ?BILITOT 0.5 0.4  ?GFRNONAA >60 >60  ?ANIONGAP 7 6  ?  ?Lipids No results for input(s): CHOL, TRIG, HDL, LABVLDL, LDLCALC, CHOLHDL in the last 168 hours.  ?Hematology ?Recent Labs  ?Lab 01/12/22 ?0235 01/13/22 ?03/15/22 01/14/22 ?0353  ?WBC 16.9* 10.0 10.2  ?RBC 4.15 4.22 4.53  ?HGB 10.8* 11.1* 11.4*  ?HCT 34.4* 35.3* 38.0  ?MCV 82.9 83.6 83.9  ?MCH 26.0 26.3 25.2*  ?MCHC 31.4 31.4 30.0  ?RDW 18.2* 18.4* 18.0*  ?PLT 435* 472* 509*  ? ?Thyroid  ?Recent Labs  ?Lab 01/13/22 ?0245  ?TSH 1.743  ?  ?BNP ?  Recent Labs  ?Lab 01/12/22 ?0235  ?BNP 129.0*  ?  ?DDimer  ?Recent Labs  ?Lab 01/12/22 ?0203  ?DDIMER 1.52*  ?  ? ?Radiology  ?  ?DG Chest 2 View ? ?Result Date: 01/14/2022 ?CLINICAL DATA:  Shortness of breath. EXAM: CHEST - 2 VIEW COMPARISON:  Jan 12, 2022. FINDINGS: Stable cardiomegaly with mild central pulmonary vascular congestion. Decreased lung opacities are noted most consistent with improving pulmonary edema. Status post cardiac valve repair. Bony thorax is unremarkable. IMPRESSION: Stable cardiomegaly with central pulmonary vascular congestion. Probable improving bilateral pulmonary edema is noted. Electronically Signed   By: Lupita Raider M.D.   On: 01/14/2022 08:11  ? ?NM Pulmonary Perfusion ? ?Result Date: 01/12/2022 ?CLINICAL DATA:  Chest pain, shortness of breath, positive D-dimer EXAM: NUCLEAR MEDICINE PERFUSION LUNG SCAN TECHNIQUE: Perfusion images were obtained in multiple projections after intravenous injection of radiopharmaceutical. Ventilation scans intentionally deferred if perfusion scan and chest x-ray  adequate for interpretation during COVID 19 epidemic. RADIOPHARMACEUTICALS:  4.4 mCi Tc-73m MAA IV COMPARISON:  Chest radiographs done earlier today FINDINGS: There small subsegmental foci of decreased uptake in the posterior left mid and left lower lung fields seen in the left posterior oblique projection. No other focal abnormality is seen. IMPRESSION: Imaging findings suggest low probability for pulmonary embolism. Electronically Signed   By: Ernie Avena M.D.   On: 01/12/2022 12:02  ? ?ECHOCARDIOGRAM COMPLETE ? ?Result Date: 01/13/2022 ?   ECHOCARDIOGRAM REPORT   Patient Name:   Theresa Crawford Date of Exam: 01/13/2022 Medical Rec #:  [id number]       Height:       64.0 in Accession #:    [id number]      Weight:       230.4 lb Date of Birth:  01/24/1981       BSA:          2.078 m? Patient Age:    41 years        BP:           142/100 mmHg Patient Gender: F               HR:           114 bpm. [name] Procedure: 2D Echo, Cardiac Doppler and Color Doppler Indications:    CHF  History:        Patient has prior history of Echocardiogram examinations, most                 recent 03/05/2019. CHF, Pulmonary HTN, Signs/Symptoms:Shortness                 of Breath; Risk Factors:Hypertension and Current Smoker. S/p MV                 Repair, TV ring 23mm [employer].  Sonographer:    Mikki Harbor Referring Phys: [id number] ASIA B ZIERLE-GHOSH IMPRESSIONS  1. Left ventricular ejection fraction, by estimation, is 50 to 55%. The left ventricle has low normal function. The left ventricle has no regional wall motion abnormalities. There is moderate left ventricular hypertrophy. Left ventricular diastolic parameters are indeterminate.  2. Right ventricular systolic function is moderately reduced. The right ventricular size is mildly enlarged. There is normal pulmonary artery systolic pressure. The estimated right ventricular systolic pressure is 26.0 mmHg.  3. Left atrial size was severely dilated.  4.  There is a 29 mm mechanical valve present in the mitral position  Trivial mitral valve regurgitation. The mean mitral valve gradient is 9.0 mmHg with average heart rate of 114 bpm. MV 1.9 cm^2 by continuity equation. Echo findings are consistent with stenosis of the mitral prosthesis.  5. The tricuspid valve is status post repair with an annuloplasty ring. MG at 114 bpm  6. The aortic valve is tricuspid. Aortic valve regurgitation is not visualized. No aortic stenosis is present.  7. The inferior vena cava is normal in size with greater than 50% respiratory variability, suggesting right atrial pressure of 3 mmHg. FINDINGS  Left Ventricle: Left ventricular ejection fraction, by estimation, is 50 to 55%. The left ventricle has low normal function. The left ventricle has no regional wall motion abnormalities. The left ventricular internal cavity size was normal in size. There is moderate left ventricular hypertrophy. Left ventricular diastolic parameters are indeterminate. Right Ventricle: The right ventricular size is mildly enlarged. Right vetricular wall thickness was not well visualized. Right ventricular systolic function is moderately reduced. There is normal pulmonary artery systolic pressure. The tricuspid regurgitant velocity is 2.40 m/s, and with an assumed right atrial pressure of 3 mmHg, the estimated right ventricular systolic pressure is 26.0 mmHg. Left Atrium: Left atrial size was severely dilated. Right Atrium: Right atrial size was normal in size. Pericardium: There is no evidence of pericardial effusion. Mitral Valve: The mitral valve has been repaired/replaced. Trivial mitral valve regurgitation. There is a 29 mm mechanical valve present in the mitral position. Echo findings are consistent with stenosis of the mitral prosthesis. MV peak gradient, 14.4 mmHg. The mean mitral valve gradient is 9.0 mmHg with average heart rate of 114 bpm. Tricuspid Valve: The tricuspid valve is has been  repaired/replaced. Tricuspid valve regurgitation is not demonstrated. The tricuspid valve is status post repair with an annuloplasty ring. Aortic Valve: The aortic valve is tricuspid. Aortic valve regurgitation is not

## 2022-01-14 NOTE — CV Procedure (Signed)
CV Procedure Note ? ?Patient was brought to the procedure suite after appropriate consent was obtained. Sedation was achieved with the assistance of anesthesiology, please see there documentation for details. Placed in left lateral decubitus position and TEE probe intubated into the esophagus without difficulty and several images obtained. Please see full TEE report findings. No evidence of intracardiac thrombus, plan to proceed with electrical cardioversion. Defib pads placed in the anterior and posterior postions, succesfully cardioverted from atrial flutter to sinus rhythm with a single synchronized 200j shock. Cardiopulmonary monitoring performed throughout the procedure, she tolerated well without complications.  ? ? ?Dina Rich MD ?

## 2022-01-14 NOTE — Transfer of Care (Signed)
Immediate Anesthesia Transfer of Care Note ? ?Patient: Theresa Crawford ? ?Procedure(s) Performed: CARDIOVERSION ?TRANSESOPHAGEAL ECHOCARDIOGRAM (TEE) ? ?Patient Location: PACU ? ?Anesthesia Type:MAC ? ?Level of Consciousness: awake, alert , oriented and patient cooperative ? ?Airway & Oxygen Therapy: Patient Spontanous Breathing and Patient connected to nasal cannula oxygen ? ?Post-op Assessment: Report given to RN and Post -op Vital signs reviewed and stable ? ?Post vital signs: Reviewed and stable ? ?Last Vitals:  ?Vitals Value Taken Time  ?BP    ?Temp    ?Pulse    ?Resp    ?SpO2    ? ? ?Last Pain:  ?Vitals:  ? 01/14/22 0902  ?TempSrc: Oral  ?PainSc: 0-No pain  ?   ? ?Patients Stated Pain Goal: 5 (01/14/22 0902) ? ?Complications: No notable events documented. ?

## 2022-01-14 NOTE — Progress Notes (Signed)
ANTICOAGULATION CONSULT NOTE - Follow Up Consult ? ?Pharmacy Consult for heparin and warfarin ?Indication:  MVR ? ?Allergies  ?Allergen Reactions  ? Bee Venom Anaphylaxis  ? Lisinopril Anaphylaxis, Shortness Of Breath, Swelling and Other (See Comments)  ?  Throat swells  ? Penicillins Shortness Of Breath, Swelling and Other (See Comments)  ?  Has patient had a PCN reaction causing immediate rash, facial/tongue/throat swelling, SOB or lightheadedness with hypotension: Yes ?Has patient had a PCN reaction causing severe rash involving mucus membranes or skin necrosis: Yes ?Has patient had a PCN reaction that required hospitalization No ?Has patient had a PCN reaction occurring within the last 10 years: No ?If all of the above answers are "NO", then may proceed with Cephalosporin use. ?  ? Perflutren Lipid Microsphere Shortness Of Breath and Other (See Comments)  ?  Chest Pain/Tightness, also  ? Shellfish Allergy Anaphylaxis  ? Contrast Media [Iodinated Contrast Media] Hives, Itching and Other (See Comments)  ?  Itching and hives to throat, neck, face and arms.   No difficulty breathing.   This was given at [facility] approximately 2015. Contrast Dye  ? ? ?Patient Measurements: ?Height: 5\' 4"  (162.6 cm) ?Weight: 103.3 kg (227 lb 11.8 oz) ?IBW/kg (Calculated) : 54.7 ?Heparin Dosing Weight: 80kg ? ?Vital Signs: ?Temp: 98.4 ?F (36.9 ?C) (05/03 2014) ?Temp Source: Oral (05/03 2014) ?BP: 120/78 (05/04 0600) ?Pulse Rate: 112 (05/04 0600) ? ?Labs: ?Recent Labs  ?  01/12/22 ?0235 01/12/22 ?0438 01/12/22 ?1136 01/13/22 ?[id number] 01/13/22 ?[id number] 01/14/22 ?0353  ?HGB 10.8*  --   --  11.1*  --  11.4*  ?HCT 34.4*  --   --  35.3*  --  38.0  ?PLT 435*  --   --  472*  --  509*  ?LABPROT 14.2  --   --   --  13.8 13.8  ?INR 1.1  --   --   --  1.1 1.1  ?HEPARINUNFRC  --   --    < > 0.52 0.50 0.54  ?CREATININE 0.91  --   --  0.86  --   --   ?TROPONINIHS 5 5  --   --   --   --   ? < > = values in this interval not displayed.   ? ? ? ?Estimated Creatinine Clearance: 99.7 mL/min (by C-G formula based on SCr of 0.86 mg/dL). ? ? ?Medical History: ?Past Medical History:  ?Diagnosis Date  ? Anxiety   ? Arthritis   ? "lower back" (04/04/2018)  ? Bipolar disorder ([place])   ? Chronic bronchitis ([place])   ? Chronic diastolic CHF (congestive heart failure) ([place])   ? Chronic lower back pain   ? DDD (degenerative disc disease), lumbar   ? Depression   ? Dyspnea   ? GERD (gastroesophageal reflux disease)   ? Headache   ? "weekly" (04/04/2018), miragrains in past (04/03/2019)  ? Heart murmur   ? Hypertension   ? Mitral valve disease   ? Normocytic anemia   ? Obesity   ? Palpitations   ? Pericarditis   ? Pneumonia   ? Premature atrial contractions   ? Pulmonary hypertension ([place])   ? PVC's (premature ventricular contractions)   ? a. h/o palpitations with event monitor in 03/2017 showing NSR with PACs/PVCs.  ? Recurrent mitral valve stenosis and regurgitation s/p mitral valve repair   ? S/P mitral valve repair 03/27/2013  ? Dr. Fransico Michael - [place], [place] - complex valvuloplasty including resuspension of  entire posterior leaflet using Gore-tex neochords and 30 mm Edwards Physio ring annuloplasty  ? S/P redo mitral valve replacement with mechanical valve 04/05/2019  ? 29 mm Sorin Carbomedics Optiform bileaflet mechanical valve  ? S/P tricuspid valve repair 04/05/2019  ? 28 mm Edwards mc3 ring annuloplasty  ? Tobacco abuse   ? Tricuspid regurgitation   ? ? ?Assessment: ?41yo female c/o SOB, admitted for acute pulmonary edema, to start heparin for MVR; of note pt is well known to be noncompliant with Coumadin, unclear if she is currently seeing a Coumadin clinic but was last seen in [facility] Coumadin clinic in 2020 and was discharged from that clinic in 2022, no other AC notes in care everywhere, pt states she is out of Coumadin, INR at baseline. ? ?Home dose of warfarin was 5mg  daily, patient non compliant due to running out of medication. Admit INR 1.1  ? ?HL 0.54,  therapeutic  ?INR 1.1 > 1.1 > 1.1  ? ?Goal of Therapy:  ?INR goal 2.5-3.5  ?Heparin level 0.3-0.7 units/ml ?Monitor platelets by anticoagulation protocol: Yes ?  ?Plan:  ?Warfarin 12.5mg  x 1 dose   ?Continue heparin infusion at 1750 units/hr. ?Monitor heparin levels in 6 hours and CBC daily. ? ?Thomasenia Sales, PharmD, MBA ?Clinical Pharmacist ? ? ?01/14/2022,7:24 AM ? ? ?

## 2022-01-14 NOTE — Anesthesia Postprocedure Evaluation (Signed)
Anesthesia Post Note ? ?Patient: Theresa Crawford ? ?Procedure(s) Performed: CARDIOVERSION ?TRANSESOPHAGEAL ECHOCARDIOGRAM (TEE) ? ?Patient location during evaluation: Phase II ?Anesthesia Type: General ?Level of consciousness: awake ?Pain management: pain level controlled ?Vital Signs Assessment: post-procedure vital signs reviewed and stable ?Respiratory status: spontaneous breathing and respiratory function stable ?Cardiovascular status: blood pressure returned to baseline and stable ?Postop Assessment: no headache and no apparent nausea or vomiting ?Anesthetic complications: no ?Comments: Late entry ? ? ?No notable events documented. ? ? ?Last Vitals:  ?Vitals:  ? 01/14/22 1159 01/14/22 1200  ?BP: 101/63 (!) 115/49  ?Pulse: 74 71  ?Resp:  18  ?Temp:  36.8 ?C  ?SpO2:  100%  ?  ?Last Pain:  ?Vitals:  ? 01/14/22 1200  ?TempSrc: Oral  ?PainSc:   ? ? ?  ?  ?  ?  ?  ?  ? ?Louann Sjogren ? ? ? ? ?

## 2022-01-14 NOTE — Progress Notes (Addendum)
?PROGRESS NOTE ? ? ? ? ?Theresa Crawford, is a 41 y.o. female, DOB - 10/13/1980, [id number] ? ?Admit date - 01/12/2022   Admitting Physician [place], DO ? ?Outpatient Primary MD for the patient is Kerin Perna, NP ? ?LOS - 2 ? ?Chief Complaint  ?Patient presents with  ? Shortness of Breath  ?    ? ? ?Brief Narrative:  ? ? ?41 y.o. female with medical history significant of bipolar disorder, chronic CHF, depression, GERD, heart murmur, palpitations, pulmonary hypertension, recurrent mitral valve stenosis and regurgitation status post mitral valve repair, tobacco use disorder, and more presents the ED with a chief complaint of shortness of breath and chest tightness.  Patient reports that started the night prior to presentation and continued to get worse throughout the day.  She reports that her associated chest pain is like a tightness.  She has associated palpitations, diaphoresis, headache, nausea.  Patient denies vomiting.  Patient reports that since she can hear her mechanical valve take she knows that her heart is racing and it was going very fast earlier today.  Patient reports productive cough but no fever.  Patient admits to constipation and reports that she has to strain to have a bowel movement patient has been on her medications for quite some time and is very careful about not having a doctor or access to her medications. ?  ?Patient reports she does smoke, but does not want a nicotine patch.  She does not drink alcohol, she does not use illicit drugs.  She is not vaccinated for COVID.  Patient is full code. ? ?  ?-Assessment and Plan: ? ? Acute on chronic combined systolic and diastolic dysfunction CHF exacerbation ?--continue 40 mg IV Lasix twice daily ?Last Echo June 2020 shows an EF of 45-50% with reduced right ventricle systolic function  ?Repeat TT echo from 01/13/2022 with EF of 50 to 55%, no regional wall motion abnormalities, mechanical trivial valve noted with findings suggestive  of stenosis of the mitral prosthesis ?-Cardiology consult appreciated ?-Continue daily weights, fluid input and output ? ? ?Subtherapeutic international normalized ratio/mechanical mitral valve ?history of MV repair in [place] in 2014 ?- 03/2019 MV replacement with carbomedics mechanical valve.  ?- Mechanical mitral valve INR goal 2.5-3.5 ?-INR subtherapeutic, continue heparin bridge and Coumadin  ? ?Atrial flutter: EKG with 2:1 atrial flutter- ?Continue IV heparin bridge and Coumadin--  ?--INR is Not yet therapeutic ?S/p successful TEE and  cardioversion on 01/14/2022 ? ?MDD (major depressive disorder), recurrent episode ([place]) ?- Patient has been out of all of her meds ?Restarted low dose Zoloft -patient will likely need to be titrated back up in the outpatient setting ?-Continue BuSpar for anxiety ?-Continue Atarax for anxiety ?-Continue Seroquel for insomnia ?-Continue to monitor ? ?Tobacco abuse ?Reports smoking less than half a pack a day  ?-counseled on cessation ?-Patient declines nicotine patch at this time ? ?Class 2 Obesity- ?-Low calorie diet, portion control and increase physical activity discussed with patient ?-Body mass index is 39.09 kg/m?. ? ? ?Disposition/Need for in-Hospital Stay- patient unable to be discharged at this time due to --- acute CHF exacerbation in the setting of noncompliance with medication requiring diuresis and IV heparin pending therapeutic INR ? ?Status is: Inpatient  ? ?Disposition: The patient is from: Home ?             Anticipated d/c is to: Home ?             Anticipated d/c date  is: > 3 days ?             Patient currently is not medically stable to d/c. ?Barriers: Not Clinically Stable-  ? ?Code Status :  -  Code Status: Full Code  ? ?Family Communication:    NA (patient is alert, awake and coherent)  ? ?DVT Prophylaxis  :   - SCDs   SCDs Start: 01/12/22 0728 ?warfarin (COUMADIN) tablet 12.5 mg  ? ?Lab Results  ?Component Value Date  ? PLT 509 (H) 01/14/2022  ? ? ?Inpatient  Medications ? ?Scheduled Meds: ? aspirin EC  81 mg Oral Daily  ? budesonide (PULMICORT) nebulizer solution  0.25 mg Nebulization BID  ? busPIRone  7.5 mg Oral TID  ? Chlorhexidine Gluconate Cloth  6 each Topical Q0600  ? furosemide  40 mg Intravenous BID  ? mouth rinse  15 mL Mouth Rinse BID  ? metoprolol tartrate  25 mg Oral TID  ? polyethylene glycol  17 g Oral Daily  ? QUEtiapine  50 mg Oral QHS  ? sertraline  25 mg Oral Daily  ? spironolactone  25 mg Oral Daily  ? warfarin  12.5 mg Oral ONCE-1600  ? Warfarin - Pharmacist Dosing Inpatient   Does not apply [id number]  ? ?Continuous Infusions: ? heparin 1,750 Units/hr (01/14/22 0600)  ? lactated ringers Stopped (01/14/22 1224)  ? ?PRN Meds:.acetaminophen **OR** acetaminophen, albuterol, docusate sodium, hydrOXYzine, morphine injection, ondansetron **OR** ondansetron (ZOFRAN) IV, oxyCODONE ? ? ?Anti-infectives (From admission, onward)  ? ? None  ? ?  ? ? ?Subjective: ?Theresa Crawford today has no fevers, no emesis,  No chest pain,   ?-Dyspnea resolved after cardioversion ?-Eating okay ? ? ?Objective: ?Vitals:  ? 01/14/22 1045 01/14/22 1100 01/14/22 1159 01/14/22 1200  ?BP: 105/69 107/70 101/63 (!) 115/49  ?Pulse: 68 68 74 71  ?Resp: (!) 25 (!) 21  18  ?Temp: 97.6 ?F (36.4 ?C)   98.2 ?F (36.8 ?C)  ?TempSrc:    Oral  ?SpO2: 99% 100%  100%  ?Weight:      ?Height:      ? ? ?Intake/Output Summary (Last 24 hours) at 01/14/2022 1258 ?Last data filed at 01/14/2022 1040 ?Gross per 24 hour  ?Intake 1167.88 ml  ?Output 550 ml  ?Net 617.88 ml  ? ?Filed Weights  ? 01/12/22 0804 01/13/22 0325 01/14/22 0500  ?Weight: 104.9 kg 104.5 kg 103.3 kg  ? ? ?Physical Exam ? ?Gen:-Awake, alert, in no acute distress ?HEENT:- [place].AT, No sclera icterus ?Neck-Supple Neck,No JVD,.  ?Lungs-  CTAB , fair symmetrical air movement ?CV- S1, S2 normal, regular , metallic valve click ?Abd-  +ve B.Sounds, Abd Soft, No tenderness,    ?Extremity/Skin:- +ve  edema, pedal pulses present  ?Psych-affect is appropriate,  oriented x3 ?Neuro-generalized weakness, no new focal deficits, no tremors ? ?Data Reviewed: I have personally reviewed following labs and imaging studies ? ?CBC: ?Recent Labs  ?Lab 01/12/22 ?0235 01/13/22 ?[id number] 01/14/22 ?0353  ?WBC 16.9* 10.0 10.2  ?NEUTROABS 13.2* 5.7  --   ?HGB 10.8* 11.1* 11.4*  ?HCT 34.4* 35.3* 38.0  ?MCV 82.9 83.6 83.9  ?PLT 435* 472* 509*  ? ?Basic Metabolic Panel: ?Recent Labs  ?Lab 01/12/22 ?0235 01/13/22 ?0245  ?NA 137 138  ?K 4.2 4.4  ?CL 108 108  ?CO2 22 24  ?GLUCOSE 122* 123*  ?BUN 19 17  ?CREATININE 0.91 0.86  ?CALCIUM 8.6* 8.7*  ?MG  --  2.3  ? ?GFR: ?Estimated Creatinine Clearance: 99.7 mL/min (by  C-G formula based on SCr of 0.86 mg/dL). ?Liver Function Tests: ?Recent Labs  ?Lab 01/12/22 ?0235 01/13/22 ?0245  ?AST 30 22  ?ALT 32 26  ?ALKPHOS 69 64  ?BILITOT 0.5 0.4  ?PROT 8.2* 7.8  ?ALBUMIN 3.9 3.7  ? ?Cardiac Enzymes: ?No results for input(s): CKTOTAL, CKMB, CKMBINDEX, TROPONINI in the last 168 hours. ?BNP (last 3 results) ?No results for input(s): PROBNP in the last 8760 hours. ?HbA1C: ?No results for input(s): HGBA1C in the last 72 hours. ?Sepsis Labs: ?@LABRCNTIP (procalcitonin:4,lacticidven:4) ?) ?Recent Results (from the past 240 hour(s))  ?MRSA Next Gen by PCR, Nasal     Status: None  ? Collection Time: 01/12/22  7:38 AM  ? Specimen: Nasal Mucosa; Nasal Swab  ?Result Value Ref Range Status  ? MRSA by PCR Next Gen NOT DETECTED NOT DETECTED Final  ?  Comment: (NOTE) ?The GeneXpert MRSA Assay (FDA approved for NASAL specimens only), ?is one component of a comprehensive MRSA colonization surveillance ?program. It is not intended to diagnose MRSA infection nor to guide ?or monitor treatment for MRSA infections. ?Test performance is not FDA approved in patients less than 2 years ?old. ?Performed at [facility], [address], [place], [place] [address] ?  ?  ? ?Radiology Studies: ?DG Chest 2 View ? ?Result Date: 01/14/2022 ?CLINICAL DATA:  Shortness of breath. EXAM: CHEST - 2 VIEW  COMPARISON:  Jan 12, 2022. FINDINGS: Stable cardiomegaly with mild central pulmonary vascular congestion. Decreased lung opacities are noted most consistent with improving pulmonary edema. Status post cardiac v

## 2022-01-15 LAB — RENAL FUNCTION PANEL
Albumin: 3.8 g/dL (ref 3.5–5.0)
Anion gap: 8 (ref 5–15)
BUN: 20 mg/dL (ref 6–20)
CO2: 25 mmol/L (ref 22–32)
Calcium: 8.8 mg/dL — ABNORMAL LOW (ref 8.9–10.3)
Chloride: 102 mmol/L (ref 98–111)
Creatinine, Ser: 0.84 mg/dL (ref 0.44–1.00)
GFR, Estimated: >60 mL/min (ref >60)
Glucose, Bld: 121 mg/dL — ABNORMAL HIGH (ref 70–99)
Phosphorus: 4 mg/dL (ref 2.5–4.6)
Potassium: 4.3 mmol/L (ref 3.5–5.1)
Sodium: 135 mmol/L (ref 135–145)

## 2022-01-15 LAB — HEPARIN LEVEL (UNFRACTIONATED): Heparin Unfractionated: 0.72 IU/mL — ABNORMAL HIGH (ref 0.30–0.70)

## 2022-01-15 LAB — CBC
HCT: 36.4 % (ref 36.0–46.0)
Hemoglobin: 11.5 g/dL — ABNORMAL LOW (ref 12.0–15.0)
MCH: 26 pg (ref 26.0–34.0)
MCHC: 31.6 g/dL (ref 30.0–36.0)
MCV: 82.4 fL (ref 80.0–100.0)
Platelets: 535 10*3/uL — ABNORMAL HIGH (ref 150–400)
RBC: 4.42 MIL/uL (ref 3.87–5.11)
RDW: 17.7 % — ABNORMAL HIGH (ref 11.5–15.5)
WBC: 9.8 10*3/uL (ref 4.0–10.5)
nRBC: 0 % (ref 0.0–0.2)

## 2022-01-15 LAB — PROTIME-INR
INR: 1.5 — ABNORMAL HIGH (ref 0.8–1.2)
Prothrombin Time: 17.6 seconds — ABNORMAL HIGH (ref 11.4–15.2)

## 2022-01-15 MED ORDER — METOPROLOL SUCCINATE ER 50 MG PO TB24
50.0000 mg | ORAL_TABLET | Freq: Every day | ORAL | Status: DC
  Administered 2022-01-15 – 2022-01-17 (×3): 50 mg via ORAL
  Filled 2022-01-15 (×3): qty 1

## 2022-01-15 MED ORDER — WARFARIN SODIUM 7.5 MG PO TABS: 7.5000 mg | ORAL_TABLET | Freq: Once | ORAL | Status: DC

## 2022-01-15 MED ORDER — HEPARIN SOD (PORK) LOCK FLUSH 100 UNIT/ML IV SOLN: 500.0000 [IU] | Freq: Once | INTRAVENOUS | Status: DC

## 2022-01-15 MED ORDER — WARFARIN SODIUM 5 MG PO TABS
10.0000 mg | ORAL_TABLET | Freq: Once | ORAL | Status: AC
  Administered 2022-01-15: 10 mg via ORAL
  Filled 2022-01-15: qty 2

## 2022-01-15 NOTE — Progress Notes (Addendum)
ANTICOAGULATION CONSULT NOTE - Follow Up Consult ? ?Pharmacy Consult for heparin and warfarin ?Indication:  MVR ? ?Patient Measurements: ?Height: 5\' 4"  (162.6 cm) ?Weight: 103.9 kg (229 lb) ?IBW/kg (Calculated) : 54.7 ?Heparin Dosing Weight: 80kg ? ?Labs: ?Recent Labs  ?  01/13/22 ?[id number] 01/13/22 ?[id number] 01/14/22 ?[id number] 01/15/22 ?[id number]  ?HGB 11.1*  --  11.4* 11.5*  ?HCT 35.3*  --  38.0 36.4  ?PLT 472*  --  509* 535*  ?LABPROT  --  13.8 13.8 17.6*  ?INR  --  1.1 1.1 1.5*  ?HEPARINUNFRC 0.52 0.50 0.54 0.72*  ?CREATININE 0.86  --   --  0.84  ? ? ?Estimated Creatinine Clearance: 102.5 mL/min (by C-G formula based on SCr of 0.84 mg/dL). ? ? ?Medical History: ? ?Assessment: ?41yo female c/o SOB, admitted for acute pulmonary edema, to start heparin for MVR; of note pt is well known to be noncompliant with Coumadin, unclear if she is currently seeing a Coumadin clinic but was last seen in [facility] Coumadin clinic in 2020 and was discharged from that clinic in 2022.  ? ?Home dose of warfarin was 5mg  daily, patient non compliant due to running out of medication. Admit INR 1.1  ? ?INR finally rising at 1.5 ? ? ?Goal of Therapy:  ?INR goal 2.5-3.5  ?Heparin level 0.3-0.7 units/ml ?Monitor platelets by anticoagulation protocol: Yes ?  ?Plan:  ?Warfarin 10mg  po x 1 ?Decrease heparin infusion to 1650 units/hr ?Continue daily HL and CBC ? ?Lorenso Courier, PharmD ?Clinical Pharmacist ?01/15/2022 8:49 AM ? ? ? ? ? ? ? ?

## 2022-01-15 NOTE — Progress Notes (Signed)
? ?Progress Note ? ?Patient Name: Theresa Crawford ?Date of Encounter: 01/15/2022 ? ?[facility] Cardiologist: Kate Sable, MD (Inactive)  ? ?Subjective  ? ?Status post TEE/DCCV with successful cardioversion yesterday and feeling better today ? ?Inpatient Medications  ?  ?Scheduled Meds: ? aspirin EC  81 mg Oral Daily  ? budesonide (PULMICORT) nebulizer solution  0.25 mg Nebulization BID  ? busPIRone  7.5 mg Oral TID  ? furosemide  40 mg Intravenous BID  ? mouth rinse  15 mL Mouth Rinse BID  ? metoprolol tartrate  25 mg Oral TID  ? polyethylene glycol  17 g Oral Daily  ? QUEtiapine  50 mg Oral QHS  ? sertraline  25 mg Oral Daily  ? spironolactone  25 mg Oral Daily  ? warfarin  7.5 mg Oral ONCE-1600  ? Warfarin - Pharmacist Dosing Inpatient   Does not apply [id number]  ? ?Continuous Infusions: ? heparin 1,750 Units/hr (01/15/22 [id number])  ? lactated ringers Stopped (01/14/22 1224)  ? ?PRN Meds: ?acetaminophen **OR** acetaminophen, albuterol, docusate sodium, hydrOXYzine, morphine injection, ondansetron **OR** ondansetron (ZOFRAN) IV, oxyCODONE  ? ?Vital Signs  ?  ?Vitals:  ? 01/14/22 1200 01/14/22 1959 01/14/22 2051 01/15/22 0516  ?BP: (!) 115/49  132/82 121/60  ?Pulse: 71  73 66  ?Resp: 18  20 17   ?Temp: 98.2 ?F (36.8 ?C)  98.2 ?F (36.8 ?C) 97.7 ?F (36.5 ?C)  ?TempSrc: Oral  Oral Oral  ?SpO2: 100% 96% 94% 97%  ?Weight:    103.9 kg  ?Height:      ? ? ?Intake/Output Summary (Last 24 hours) at 01/15/2022 0934 ?Last data filed at 01/15/2022 [id number] ?Gross per 24 hour  ?Intake 1456.6 ml  ?Output --  ?Net 1456.6 ml  ? ? ? ?  01/15/2022  ?  5:16 AM 01/14/2022  ?  5:00 AM 01/13/2022  ?  3:25 AM  ?Last 3 Weights  ?Weight (lbs) 229 lb 227 lb 11.8 oz 230 lb 6.1 oz  ?Weight (kg) 103.874 kg 103.3 kg 104.5 kg  ?   ? ?Telemetry  ?  ?Normal sinus rhythm- Personally Reviewed ? ?ECG  ?  ?N/a - Personally Reviewed ? ?Physical Exam  ? ?GEN: Well nourished, well developed in no acute distress ?HEENT: Normal ?NECK: No JVD; No carotid  bruits ?LYMPHATICS: No lymphadenopathy ?CARDIAC:RRR, no murmurs, rubs, gallops ?RESPIRATORY:  Clear to auscultation without rales, wheezing or rhonchi  ?ABDOMEN: Soft, non-tender, non-distended ?MUSCULOSKELETAL:  No edema; No deformity  ?SKIN: Warm and dry ?NEUROLOGIC:  Alert and oriented x 3 ?PSYCHIATRIC:  Normal affect   ?Labs  ?  ?High Sensitivity Troponin:   ?Recent Labs  ?Lab 01/12/22 ?0235 01/12/22 ?[id number]  ?TROPONINIHS 5 5  ? ?   ?Chemistry ?Recent Labs  ?Lab 01/12/22 ?0235 01/13/22 ?[id number] 01/15/22 ?0615  ?NA 137 138 135  ?K 4.2 4.4 4.3  ?CL 108 108 102  ?CO2 22 24 25   ?GLUCOSE 122* 123* 121*  ?BUN 19 17 20   ?CREATININE 0.91 0.86 0.84  ?CALCIUM 8.6* 8.7* 8.8*  ?MG  --  2.3  --   ?PROT 8.2* 7.8  --   ?ALBUMIN 3.9 3.7 3.8  ?AST 30 22  --   ?ALT 32 26  --   ?ALKPHOS 69 64  --   ?BILITOT 0.5 0.4  --   ?GFRNONAA >60 >60 >60  ?ANIONGAP 7 6 8   ? ?  ?Lipids No results for input(s): CHOL, TRIG, HDL, LABVLDL, LDLCALC, CHOLHDL in the last 168 hours.  ?Hematology ?Recent  Labs  ?Lab 01/13/22 ?5277 01/14/22 ?8242 01/15/22 ?0615  ?WBC 10.0 10.2 9.8  ?RBC 4.22 4.53 4.42  ?HGB 11.1* 11.4* 11.5*  ?HCT 35.3* 38.0 36.4  ?MCV 83.6 83.9 82.4  ?MCH 26.3 25.2* 26.0  ?MCHC 31.4 30.0 31.6  ?RDW 18.4* 18.0* 17.7*  ?PLT 472* 509* 535*  ? ? ?Thyroid  ?Recent Labs  ?Lab 01/13/22 ?0245  ?TSH 1.743  ? ?  ?BNP ?Recent Labs  ?Lab 01/12/22 ?0235  ?BNP 129.0*  ? ?  ?DDimer  ?Recent Labs  ?Lab 01/12/22 ?0203  ?DDIMER 1.52*  ? ?  ? ?Radiology  ?  ?DG Chest 2 View ? ?Result Date: 01/14/2022 ?CLINICAL DATA:  Shortness of breath. EXAM: CHEST - 2 VIEW COMPARISON:  Jan 12, 2022. FINDINGS: Stable cardiomegaly with mild central pulmonary vascular congestion. Decreased lung opacities are noted most consistent with improving pulmonary edema. Status post cardiac valve repair. Bony thorax is unremarkable. IMPRESSION: Stable cardiomegaly with central pulmonary vascular congestion. Probable improving bilateral pulmonary edema is noted. Electronically Signed   By:  Lupita Raider M.D.   On: 01/14/2022 08:11  ? ?ECHOCARDIOGRAM COMPLETE ? ?Result Date: 01/13/2022 ?   ECHOCARDIOGRAM REPORT   Patient Name:   Theresa Crawford Date of Exam: 01/13/2022 Medical Rec #:  [id number]       Height:       64.0 in Accession #:    [id number]      Weight:       230.4 lb Date of Birth:  [birth year]       BSA:          2.078 m? Patient Age:    41 years        BP:           142/100 mmHg Patient Gender: F               HR:           114 bpm. [name] Procedure: 2D Echo, Cardiac Doppler and Color Doppler Indications:    CHF  History:        Patient has prior history of Echocardiogram examinations, most                 recent 03/05/2019. CHF, Pulmonary HTN, Signs/Symptoms:Shortness                 of Breath; Risk Factors:Hypertension and Current Smoker. S/p MV                 Repair, TV ring 7mm [employer].  Sonographer:    Mikki Harbor Referring Phys: [id number] ASIA B ZIERLE-GHOSH IMPRESSIONS  1. Left ventricular ejection fraction, by estimation, is 50 to 55%. The left ventricle has low normal function. The left ventricle has no regional wall motion abnormalities. There is moderate left ventricular hypertrophy. Left ventricular diastolic parameters are indeterminate.  2. Right ventricular systolic function is moderately reduced. The right ventricular size is mildly enlarged. There is normal pulmonary artery systolic pressure. The estimated right ventricular systolic pressure is 26.0 mmHg.  3. Left atrial size was severely dilated.  4. There is a 29 mm mechanical valve present in the mitral position     Trivial mitral valve regurgitation. The mean mitral valve gradient is 9.0 mmHg with average heart rate of 114 bpm. MV 1.9 cm^2 by continuity equation. Echo findings are consistent with stenosis of the mitral prosthesis.  5. The tricuspid valve is status post repair with an annuloplasty  ring. MG 20mmHg at 114 bpm  6. The aortic valve is tricuspid. Aortic valve regurgitation is not  visualized. No aortic stenosis is present.  7. The inferior vena cava is normal in size with greater than 50% respiratory variability, suggesting right atrial pressure of 3 mmHg. FINDINGS  Left Ventricle: Left ventricular ejection fraction, by estimation, is 50 to 55%. The left ventricle has low normal function. The left ventricle has no regional wall motion abnormalities. The left ventricular internal cavity size was normal in size. There is moderate left ventricular hypertrophy. Left ventricular diastolic parameters are indeterminate. Right Ventricle: The right ventricular size is mildly enlarged. Right vetricular wall thickness was not well visualized. Right ventricular systolic function is moderately reduced. There is normal pulmonary artery systolic pressure. The tricuspid regurgitant velocity is 2.40 m/s, and with an assumed right atrial pressure of 3 mmHg, the estimated right ventricular systolic pressure is [id number] mmHg. Left Atrium: Left atrial size was severely dilated. Right Atrium: Right atrial size was normal in size. Pericardium: There is no evidence of pericardial effusion. Mitral Valve: The mitral valve has been repaired/replaced. Trivial mitral valve regurgitation. There is a 29 mm mechanical valve present in the mitral position. Echo findings are consistent with stenosis of the mitral prosthesis. MV peak gradient, 14.4 mmHg. The mean mitral valve gradient is 9.0 mmHg with average heart rate of 114 bpm. Tricuspid Valve: The tricuspid valve is has been repaired/replaced. Tricuspid valve regurgitation is not demonstrated. The tricuspid valve is status post repair with an annuloplasty ring. Aortic Valve: The aortic valve is tricuspid. Aortic valve regurgitation is not visualized. No aortic stenosis is present. Aortic valve mean gradient measures 3.0 mmHg. Aortic valve peak gradient measures 6.2 mmHg. Aortic valve area, by VTI measures 2.39 cm?. Pulmonic Valve: The pulmonic valve was not well visualized.  Pulmonic valve regurgitation is not visualized. Aorta: The aortic root is normal in size and structure. Venous: The inferior vena cava is normal in size with greater than 50% respiratory variability, suggesting right atria

## 2022-01-15 NOTE — Progress Notes (Signed)
?PROGRESS NOTE ? ? ? ? ?Theresa Crawford, is a 41 y.o. female, DOB - Feb 15, 1981, [id number] ? ?Admit date - 01/12/2022   Admitting Physician Theresa Hill, DO ? ?Outpatient Primary MD for the patient is Theresa Perna, NP ? ?LOS - 3 ? ?Chief Complaint  ?Patient presents with  ? Shortness of Breath  ?    ? ? ?Brief Narrative:  ? ? ?41 y.o. female with medical history significant of bipolar disorder, chronic CHF, depression, GERD, heart murmur, palpitations, pulmonary hypertension, recurrent mitral valve stenosis and regurgitation status post mitral valve repair, tobacco use disorder, and more presents the ED with a chief complaint of shortness of breath and chest tightness.  Patient reports that started the night prior to presentation and continued to get worse throughout the day.  She reports that her associated chest pain is like a tightness.  She has associated palpitations, diaphoresis, headache, nausea.  Patient denies vomiting.  Patient reports that since she can hear her mechanical valve take she knows that her heart is racing and it was going very fast earlier Crawford.  Patient reports productive cough but no fever.  Patient admits to constipation and reports that she has to strain to have a bowel movement patient has been on her medications for quite some time and is very careful about not having a doctor or access to her medications. ?  ?Patient reports she does smoke, but does not want a nicotine patch.  She does not drink alcohol, she does not use illicit drugs.  She is not vaccinated for COVID.  Patient is full code. ? ?  ?-Assessment and Plan: ? ? Acute on chronic combined systolic and diastolic dysfunction CHF exacerbation ?--continue 40 mg IV Lasix twice daily ?Last Echo June 2020 shows an EF of 45-50% with reduced right ventricle systolic function  ?Repeat TT echo from 01/13/2022 with EF of 50 to 55%, no regional wall motion abnormalities, mechanical trivial valve noted with findings suggestive  of stenosis of the mitral prosthesis ?-Cardiology consult appreciated ?-Continue daily weights, fluid input and output ?-Diuresing well, clinically improving ? ? ?Subtherapeutic international normalized ratio/mechanical mitral valve ?history of MV repair in [place] in 2014 ?- 03/2019 MV replacement with carbomedics mechanical valve.  ?- Mechanical mitral valve INR goal 2.5-3.5 ?-INR subtherapeutic, continue heparin bridge and Coumadin  ? ?Atrial flutter: EKG with 2:1 atrial flutter- ?Continue IV heparin bridge and Coumadin--  ?--INR is Not yet therapeutic ?S/p successful TEE and  cardioversion on 01/14/2022 ?-Remains in sinus rhythm ? ?MDD (major depressive disorder), recurrent episode ([place]) ?- Patient has been out of all of her meds ?Restarted low dose Zoloft -patient will likely need to be titrated back up in the outpatient setting ?-Continue BuSpar for anxiety ?-Continue Atarax for anxiety ?-Continue Seroquel for insomnia ?-Continue to monitor ? ?Tobacco abuse ?Reports smoking less than half a pack a day  ?-counseled on cessation ?-Patient declines nicotine patch at this time ? ?Class 2 Obesity- ?-Low calorie diet, portion control and increase physical activity discussed with patient ?-Body mass index is 39.31 kg/m?. ? ? ?Disposition/Need for in-Hospital Stay- patient unable to be discharged at this time due to --- acute CHF exacerbation in the setting of noncompliance with medication requiring diuresis and mechanical mitral valve--requiring IV heparin pending therapeutic INR ? ?Status is: Inpatient  ? ?Disposition: The patient is from: Home ?             Anticipated d/c is to: Home ?  Anticipated d/c date is: > 3 days ?             Patient currently is not medically stable to d/c. ?Barriers: Not Clinically Stable-  ? ?Code Status :  -  Code Status: Full Code  ? ?Family Communication:    NA (patient is alert, awake and coherent)  ? ?DVT Prophylaxis  :   - SCDs   SCDs Start: 01/12/22 0728 ? ? ?Lab Results   ?Component Value Date  ? PLT 535 (H) 01/15/2022  ? ? ?Inpatient Medications ? ?Scheduled Meds: ? aspirin EC  81 mg Oral Daily  ? budesonide (PULMICORT) nebulizer solution  0.25 mg Nebulization BID  ? busPIRone  7.5 mg Oral TID  ? furosemide  40 mg Intravenous BID  ? mouth rinse  15 mL Mouth Rinse BID  ? metoprolol succinate  50 mg Oral Daily  ? polyethylene glycol  17 g Oral Daily  ? QUEtiapine  50 mg Oral QHS  ? sertraline  25 mg Oral Daily  ? spironolactone  25 mg Oral Daily  ? Warfarin - Pharmacist Dosing Inpatient   Does not apply [id number]  ? ?Continuous Infusions: ? heparin 1,650 Units/hr (01/15/22 1803)  ? lactated ringers Stopped (01/14/22 1224)  ? ?PRN Meds:.acetaminophen **OR** acetaminophen, albuterol, docusate sodium, hydrOXYzine, morphine injection, ondansetron **OR** ondansetron (ZOFRAN) IV, oxyCODONE ? ? ?Anti-infectives (From admission, onward)  ? ? None  ? ?  ? ? ?Subjective: ?Theresa Crawford has no fevers, no emesis,  No chest pain,   ?-Resting comfortably ?-No significant dyspnea or dyspnea on exertion at this time ?-Continues to void well ? ? ?Objective: ?Vitals:  ? 01/14/22 1959 01/14/22 2051 01/15/22 0516 01/15/22 1337  ?BP:  132/82 121/60 136/73  ?Pulse:  73 66 72  ?Resp:  20 17 18   ?Temp:  98.2 ?F (36.8 ?C) 97.7 ?F (36.5 ?C) 98.3 ?F (36.8 ?C)  ?TempSrc:  Oral Oral Oral  ?SpO2: 96% 94% 97% 98%  ?Weight:   103.9 kg   ?Height:      ? ? ?Intake/Output Summary (Last 24 hours) at 01/15/2022 1912 ?Last data filed at 01/15/2022 1803 ?Gross per 24 hour  ?Intake 1143.59 ml  ?Output --  ?Net 1143.59 ml  ? ?Filed Weights  ? 01/13/22 0325 01/14/22 0500 01/15/22 0516  ?Weight: 104.5 kg 103.3 kg 103.9 kg  ? ? ?Physical Exam ? ?Gen:-Awake, alert, in no acute distress ?HEENT:- [place].AT, No sclera icterus ?Neck-Supple Neck,No JVD,.  ?Lungs-  CTAB , fair symmetrical air movement ?CV- S1, S2 normal, regular , metallic valve click ?Abd-  +ve B.Sounds, Abd Soft, No tenderness,    ?Extremity/Skin:- +ve  edema, pedal  pulses present  ?Psych-affect is appropriate, oriented x3 ?Neuro-generalized weakness, no new focal deficits, no tremors ? ?Data Reviewed: I have personally reviewed following labs and imaging studies ? ?CBC: ?Recent Labs  ?Lab 01/12/22 ?0235 01/13/22 ?[id number] 01/14/22 ?[id number] 01/15/22 ?0615  ?WBC 16.9* 10.0 10.2 9.8  ?NEUTROABS 13.2* 5.7  --   --   ?HGB 10.8* 11.1* 11.4* 11.5*  ?HCT 34.4* 35.3* 38.0 36.4  ?MCV 82.9 83.6 83.9 82.4  ?PLT 435* 472* 509* 535*  ? ?Basic Metabolic Panel: ?Recent Labs  ?Lab 01/12/22 ?0235 01/13/22 ?[id number] 01/15/22 ?0615  ?NA 137 138 135  ?K 4.2 4.4 4.3  ?CL 108 108 102  ?CO2 22 24 25   ?GLUCOSE 122* 123* 121*  ?BUN 19 17 20   ?CREATININE 0.91 0.86 0.84  ?CALCIUM 8.6* 8.7* 8.8*  ?MG  --  2.3  --   ?PHOS  --   --  4.0  ? ?GFR: ?Estimated Creatinine Clearance: 102.5 mL/min (by C-G formula based on SCr of 0.84 mg/dL). ?Liver Function Tests: ?Recent Labs  ?Lab 01/12/22 ?0235 01/13/22 ?[id number] 01/15/22 ?0615  ?AST 30 22  --   ?ALT 32 26  --   ?ALKPHOS 69 64  --   ?BILITOT 0.5 0.4  --   ?PROT 8.2* 7.8  --   ?ALBUMIN 3.9 3.7 3.8  ? ?Cardiac Enzymes: ?No results for input(s): CKTOTAL, CKMB, CKMBINDEX, TROPONINI in the last 168 hours. ?BNP (last 3 results) ?No results for input(s): PROBNP in the last 8760 hours. ?HbA1C: ?No results for input(s): HGBA1C in the last 72 hours. ?Sepsis Labs: ?@LABRCNTIP (procalcitonin:4,lacticidven:4) ?) ?Recent Results (from the past 240 hour(s))  ?MRSA Next Gen by PCR, Nasal     Status: None  ? Collection Time: 01/12/22  7:38 AM  ? Specimen: Nasal Mucosa; Nasal Swab  ?Result Value Ref Range Status  ? MRSA by PCR Next Gen NOT DETECTED NOT DETECTED Final  ?  Comment: (NOTE) ?The GeneXpert MRSA Assay (FDA approved for NASAL specimens only), ?is one component of a comprehensive MRSA colonization surveillance ?program. It is not intended to diagnose MRSA infection nor to guide ?or monitor treatment for MRSA infections. ?Test performance is not FDA approved in patients less than 2  years ?old. ?Performed at [facility], [address] [place], [place] [address] ?  ?  ? ?Radiology Studies: ?DG Chest 2 View ? ?Result Date: 01/14/2022 ?CLINICAL DATA:  Shortness of breath. EXAM: CHEST - 2 VIEW CO

## 2022-01-16 LAB — CBC
HCT: 37.4 % (ref 36.0–46.0)
Hemoglobin: 11.9 g/dL — ABNORMAL LOW (ref 12.0–15.0)
MCH: 26.3 pg (ref 26.0–34.0)
MCHC: 31.8 g/dL (ref 30.0–36.0)
MCV: 82.6 fL (ref 80.0–100.0)
Platelets: 518 10*3/uL — ABNORMAL HIGH (ref 150–400)
RBC: 4.53 MIL/uL (ref 3.87–5.11)
RDW: 17.7 % — ABNORMAL HIGH (ref 11.5–15.5)
WBC: 8.3 10*3/uL (ref 4.0–10.5)
nRBC: 0 % (ref 0.0–0.2)

## 2022-01-16 LAB — PROTIME-INR
INR: 1.8 — ABNORMAL HIGH (ref 0.8–1.2)
Prothrombin Time: 20.8 seconds — ABNORMAL HIGH (ref 11.4–15.2)

## 2022-01-16 LAB — HEPARIN LEVEL (UNFRACTIONATED): Heparin Unfractionated: 0.67 IU/mL (ref 0.30–0.70)

## 2022-01-16 MED ORDER — WARFARIN SODIUM 5 MG PO TABS
10.0000 mg | ORAL_TABLET | Freq: Once | ORAL | Status: AC
  Administered 2022-01-16: 10 mg via ORAL
  Filled 2022-01-16: qty 2

## 2022-01-16 MED ORDER — SENNOSIDES-DOCUSATE SODIUM 8.6-50 MG PO TABS
2.0000 | ORAL_TABLET | Freq: Every day | ORAL | Status: DC
Start: 2022-01-16 — End: 2022-01-17

## 2022-01-16 MED ORDER — LACTULOSE 10 GM/15ML PO SOLN
30.0000 g | Freq: Once | ORAL | Status: AC
  Administered 2022-01-16: 30 g via ORAL
  Filled 2022-01-16: qty 60

## 2022-01-16 MED ORDER — TRAZODONE HCL 50 MG PO TABS
50.0000 mg | ORAL_TABLET | Freq: Every day | ORAL | Status: DC
  Administered 2022-01-16: 50 mg via ORAL
  Filled 2022-01-16: qty 1

## 2022-01-16 MED ORDER — POLYETHYLENE GLYCOL 3350 17 G PO PACK
17.0000 g | PACK | Freq: Two times a day (BID) | ORAL | Status: DC
  Filled 2022-01-16: qty 1

## 2022-01-16 NOTE — Progress Notes (Signed)
?PROGRESS NOTE ? ? ? ? ?Theresa Crawford, is a 41 y.o. female, DOB - [birth year], AYT:[id number] ? ?Admit date - 01/12/2022   Admitting Physician Asia B Zierle-Ghosh, DO ? ?Outpatient Primary MD for the patient is Grayce Sessions, NP ? ?LOS - 4 ? ?Chief Complaint  ?Patient presents with  ? Shortness of Breath  ?    ? ? ?Brief Narrative:  ? ? ?41 y.o. female with medical history significant of bipolar disorder, chronic CHF, depression, GERD, heart murmur, palpitations, pulmonary hypertension, recurrent mitral valve stenosis and regurgitation status post mitral valve repair, tobacco use disorder, and more presents the ED with a chief complaint of shortness of breath and chest tightness.  Patient reports that started the night prior to presentation and continued to get worse throughout the day.  She reports that her associated chest pain is like a tightness.  She has associated palpitations, diaphoresis, headache, nausea.  Patient denies vomiting.  Patient reports that since she can hear her mechanical valve take she knows that her heart is racing and it was going very fast earlier today.  Patient reports productive cough but no fever.  Patient admits to constipation and reports that she has to strain to have a bowel movement patient has been on her medications for quite some time and is very careful about not having a doctor or access to her medications. ?  ?Patient reports she does smoke, but does not want a nicotine patch.  She does not drink alcohol, she does not use illicit drugs.  She is not vaccinated for COVID.  Patient is full code. ? ?  ?-Assessment and Plan: ? ? Acute on chronic combined systolic and diastolic dysfunction CHF exacerbation ?--continue 40 mg IV Lasix twice daily ?Last Echo June 2020 shows an EF of 45-50% with reduced right ventricle systolic function  ?Repeat TT echo from 01/13/2022 with EF of 50 to 55%, no regional wall motion abnormalities, mechanical trivial valve noted with findings suggestive  of stenosis of the mitral prosthesis ?-Cardiology consult appreciated ?-Continue daily weights, fluid input and output ?-Diuresing well, clinically improving ? ? ?Subtherapeutic international normalized ratio/mechanical mitral valve ?history of MV repair in [place] in 2014 ?- 03/2019 MV replacement with carbomedics mechanical valve.  ?- Mechanical mitral valve INR goal 2.5-3.5 ?-INR subtherapeutic, continue heparin bridge and Coumadin  ? ?Atrial flutter: EKG with 2:1 atrial flutter- ?Continue IV heparin bridge and Coumadin--  ?--INR is Not yet therapeutic, INR is trending up however ?S/p successful TEE and  cardioversion on 01/14/2022 ?-Remains in sinus rhythm ?-INR goal is 2.5-3.5 ? ?MDD (major depressive disorder), recurrent episode (HCC) ?- Patient has been out of all of her meds ?Restarted low dose Zoloft -patient will likely need to be titrated back up in the outpatient setting ?-Continue BuSpar for anxiety ?-Continue Atarax for anxiety ?-Continue Seroquel for insomnia ?-Continue to monitor ? ?Tobacco abuse ?Reports smoking less than half a pack a day  ?-counseled on cessation ?-Patient declines nicotine patch at this time ? ?Class 2 Obesity- ?-Low calorie diet, portion control and increase physical activity discussed with patient ?-Body mass index is 39.24 kg/m?. ? ? ?Disposition/Need for in-Hospital Stay- patient unable to be discharged at this time due to --- acute CHF exacerbation in the setting of noncompliance with medication requiring diuresis and mechanical mitral valve--requiring IV heparin pending therapeutic INR ? ?Status is: Inpatient  ? ?Disposition: The patient is from: Home ?             Anticipated  d/c is to: Home ?             Anticipated d/c date is: 2 days ?             Patient currently is not medically stable to d/c. ?Barriers: Not Clinically Stable-  ? ?Code Status :  -  Code Status: Full Code  ? ?Family Communication:    NA (patient is alert, awake and coherent)  ? ?DVT Prophylaxis  :   - SCDs    SCDs Start: 01/12/22 0728 ? ? ?Lab Results  ?Component Value Date  ? PLT 518 (H) 01/16/2022  ? ? ?Inpatient Medications ? ?Scheduled Meds: ? aspirin EC  81 mg Oral Daily  ? budesonide (PULMICORT) nebulizer solution  0.25 mg Nebulization BID  ? busPIRone  7.5 mg Oral TID  ? furosemide  40 mg Intravenous BID  ? mouth rinse  15 mL Mouth Rinse BID  ? metoprolol succinate  50 mg Oral Daily  ? polyethylene glycol  17 g Oral BID  ? QUEtiapine  50 mg Oral QHS  ? senna-docusate  2 tablet Oral QHS  ? sertraline  25 mg Oral Daily  ? spironolactone  25 mg Oral Daily  ? Warfarin - Pharmacist Dosing Inpatient   Does not apply q1600  ? ?Continuous Infusions: ? heparin 1,650 Units/hr (01/16/22 1535)  ? lactated ringers Stopped (01/14/22 1224)  ? ?PRN Meds:.acetaminophen **OR** acetaminophen, albuterol, hydrOXYzine, morphine injection, ondansetron **OR** ondansetron (ZOFRAN) IV, oxyCODONE ? ? ?Anti-infectives (From admission, onward)  ? ? None  ? ?  ? ?Subjective: ?Theresa Crawford today has no fevers, no emesis,  No chest pain,   ?- ?Ambulating in the hallways ?-Complains of constipation ? ?Objective: ?Vitals:  ? 01/15/22 2118 01/16/22 0449 01/16/22 0554 01/16/22 0809  ?BP: 116/69 116/77    ?Pulse: 79 66    ?Resp: 16 18    ?Temp: 98.3 ?F (36.8 ?C) 97.9 ?F (36.6 ?C)    ?TempSrc: Oral Oral    ?SpO2: 97% 100%  95%  ?Weight:   103.7 kg   ?Height:      ? ? ?Intake/Output Summary (Last 24 hours) at 01/16/2022 1812 ?Last data filed at 01/16/2022 1500 ?Gross per 24 hour  ?Intake 1531.61 ml  ?Output --  ?Net 1531.61 ml  ? ?Filed Weights  ? 01/14/22 0500 01/15/22 0516 01/16/22 0554  ?Weight: 103.3 kg 103.9 kg 103.7 kg  ? ?Physical Exam ?Gen:-Awake, alert, in no acute distress ?HEENT:- [place].AT, No sclera icterus ?Neck-Supple Neck,No JVD,.  ?Lungs-  CTAB , fair symmetrical air movement ?CV- S1, S2 normal, regular , metallic valve click ?Abd-  +ve B.Sounds, Abd Soft, No tenderness,    ?Extremity/Skin:- +ve  edema, pedal pulses present  ?Psych-affect is  appropriate, oriented x3 ?Neuro-generalized weakness, no new focal deficits, no tremors ? ?Data Reviewed: I have personally reviewed following labs and imaging studies ? ?CBC: ?Recent Labs  ?Lab 01/12/22 ?0235 01/13/22 ?6644 01/14/22 ?0347 01/15/22 ?0615 01/16/22 ?4259  ?WBC 16.9* 10.0 10.2 9.8 8.3  ?NEUTROABS 13.2* 5.7  --   --   --   ?HGB 10.8* 11.1* 11.4* 11.5* 11.9*  ?HCT 34.4* 35.3* 38.0 36.4 37.4  ?MCV 82.9 83.6 83.9 82.4 82.6  ?PLT 435* 472* 509* 535* 518*  ? ?Basic Metabolic Panel: ?Recent Labs  ?Lab 01/12/22 ?0235 01/13/22 ?5638 01/15/22 ?0615  ?NA 137 138 135  ?K 4.2 4.4 4.3  ?CL 108 108 102  ?CO2 22 24 25   ?GLUCOSE 122* 123* 121*  ?BUN 19  17 20  ?CREATININE 0.91 0.86 0.84  ?CALCIUM 8.6* 8.7* 8.8*  ?MG  --  2.3  --   ?PHOS  --   --  4.0  ? ?GFR: ?Estimated Creatinine Clearance: 102.3 mL/min (by C-G formula based on SCr of 0.84 mg/dL). ?Liver Function Tests: ?Recent Labs  ?Lab 01/12/22 ?0235 01/13/22 ?[id number] 01/15/22 ?0615  ?AST 30 22  --   ?ALT 32 26  --   ?ALKPHOS 69 64  --   ?BILITOT 0.5 0.4  --   ?PROT 8.2* 7.8  --   ?ALBUMIN 3.9 3.7 3.8  ? ?Cardiac Enzymes: ?No results for input(s): CKTOTAL, CKMB, CKMBINDEX, TROPONINI in the last 168 hours. ?BNP (last 3 results) ?No results for input(s): PROBNP in the last 8760 hours. ?HbA1C: ?No results for input(s): HGBA1C in the last 72 hours. ?Sepsis Labs: ?@LABRCNTIP (procalcitonin:4,lacticidven:4) ?) ?Recent Results (from the past 240 hour(s))  ?MRSA Next Gen by PCR, Nasal     Status: None  ? Collection Time: 01/12/22  7:38 AM  ? Specimen: Nasal Mucosa; Nasal Swab  ?Result Value Ref Range Status  ? MRSA by PCR Next Gen NOT DETECTED NOT DETECTED Final  ?  Comment: (NOTE) ?The GeneXpert MRSA Assay (FDA approved for NASAL specimens only), ?is one component of a comprehensive MRSA colonization surveillance ?program. It is not intended to diagnose MRSA infection nor to guide ?or monitor treatment for MRSA infections. ?Test performance is not FDA approved in patients less  than 2 years ?old. ?Performed at [facility], [address] [place], [place] [id number] ?  ?  ?Radiology Studies: ?No results found. ? ?Scheduled Meds: ? aspirin EC  81 mg Oral Daily  ? budesonide (

## 2022-01-16 NOTE — Progress Notes (Signed)
ANTICOAGULATION CONSULT NOTE - Follow Up Consult ? ?Pharmacy Consult for heparin and warfarin ?Indication:  MVR ? ?Patient Measurements: ?Height: 5\' 4"  (162.6 cm) ?Weight: 103.7 kg (228 lb 9.9 oz) ?IBW/kg (Calculated) : 54.7 ?Heparin Dosing Weight: 80kg ? ?Labs: ?Recent Labs  ?  01/14/22 ?0353 01/15/22 ?0615 01/16/22 ?[id number]  ?HGB 11.4* 11.5* 11.9*  ?HCT 38.0 36.4 37.4  ?PLT 509* 535* 518*  ?LABPROT 13.8 17.6* 20.8*  ?INR 1.1 1.5* 1.8*  ?HEPARINUNFRC 0.54 0.72* 0.67  ?CREATININE  --  0.84  --   ? ? ?Estimated Creatinine Clearance: 102.3 mL/min (by C-G formula based on SCr of 0.84 mg/dL). ? ? ?Medical History: ? ?Assessment: ?41yo female c/o SOB, admitted for acute pulmonary edema, to start heparin for MVR; of note pt is well known to be noncompliant with Coumadin, unclear if she is currently seeing a Coumadin clinic but was last seen in [facility] Coumadin clinic in 2020 and was discharged from that clinic in 2022.  ? ?Home dose of warfarin was 5mg  daily, patient non compliant due to running out of medication. Admit INR 1.1  ? ?INR finally rising at 1.8. Heparin is therapeutic ? ? ?Goal of Therapy:  ?INR goal 2.5-3.5  ?Heparin level 0.3-0.7 units/ml ?Monitor platelets by anticoagulation protocol: Yes ?  ?Plan:  ?Warfarin 10mg  po x 1 ?Continue heparin infusion to 1650 units/hr ?Continue daily INR, HL, and CBC ? ?Lorenso Courier, PharmD ?Clinical Pharmacist ?01/16/2022 7:45 AM ? ? ? ? ? ? ? ?

## 2022-01-17 LAB — CBC
HCT: 38.7 % (ref 36.0–46.0)
Hemoglobin: 12.1 g/dL (ref 12.0–15.0)
MCH: 25.7 pg — ABNORMAL LOW (ref 26.0–34.0)
MCHC: 31.3 g/dL (ref 30.0–36.0)
MCV: 82.2 fL (ref 80.0–100.0)
Platelets: 504 10*3/uL — ABNORMAL HIGH (ref 150–400)
RBC: 4.71 MIL/uL (ref 3.87–5.11)
RDW: 17.5 % — ABNORMAL HIGH (ref 11.5–15.5)
WBC: 9 10*3/uL (ref 4.0–10.5)
nRBC: 0 % (ref 0.0–0.2)

## 2022-01-17 LAB — PROTIME-INR
INR: 2.4 — ABNORMAL HIGH (ref 0.8–1.2)
Prothrombin Time: 25.7 seconds — ABNORMAL HIGH (ref 11.4–15.2)

## 2022-01-17 LAB — HEPARIN LEVEL (UNFRACTIONATED): Heparin Unfractionated: 0.69 IU/mL (ref 0.30–0.70)

## 2022-01-17 MED ORDER — POTASSIUM CHLORIDE ER 10 MEQ PO TBCR: 10.0000 meq | EXTENDED_RELEASE_TABLET | Freq: Every day | ORAL | 2 refills | Status: DC

## 2022-01-17 MED ORDER — HYDROXYZINE PAMOATE 25 MG PO CAPS: 25.0000 mg | ORAL_CAPSULE | Freq: Three times a day (TID) | ORAL | 0 refills | Status: DC | PRN

## 2022-01-17 MED ORDER — FOLIC ACID 1 MG PO TABS: 1.0000 mg | ORAL_TABLET | Freq: Every day | ORAL | 3 refills | Status: AC

## 2022-01-17 MED ORDER — SERTRALINE HCL 50 MG PO TABS: 50.0000 mg | ORAL_TABLET | Freq: Every day | ORAL | 2 refills | Status: DC

## 2022-01-17 MED ORDER — METOPROLOL SUCCINATE ER 25 MG PO TB24: 25.0000 mg | ORAL_TABLET | Freq: Every day | ORAL | 5 refills | Status: DC

## 2022-01-17 MED ORDER — SPIRONOLACTONE 25 MG PO TABS: 25.0000 mg | ORAL_TABLET | Freq: Every morning | ORAL | 5 refills | Status: DC

## 2022-01-17 MED ORDER — FERROUS SULFATE 325 (65 FE) MG PO TABS: 325.0000 mg | ORAL_TABLET | Freq: Every day | ORAL | 3 refills | Status: DC

## 2022-01-17 MED ORDER — ALBUTEROL SULFATE HFA 108 (90 BASE) MCG/ACT IN AERS
2.0000 | INHALATION_SPRAY | Freq: Four times a day (QID) | RESPIRATORY_TRACT | 2 refills | Status: DC | PRN
Start: 2022-01-17 — End: 2023-04-10

## 2022-01-17 MED ORDER — WARFARIN SODIUM 5 MG PO TABS: 5.0000 mg | ORAL_TABLET | Freq: Every evening | ORAL | 2 refills | Status: DC

## 2022-01-17 MED ORDER — FUROSEMIDE 40 MG PO TABS: 40.0000 mg | ORAL_TABLET | Freq: Every day | ORAL | 3 refills | Status: DC

## 2022-01-17 MED ORDER — WARFARIN SODIUM 5 MG PO TABS
5.0000 mg | ORAL_TABLET | Freq: Once | ORAL | Status: AC
  Administered 2022-01-17: 5 mg via ORAL
  Filled 2022-01-17: qty 1

## 2022-01-17 MED ORDER — TRAZODONE HCL 50 MG PO TABS: 50.0000 mg | ORAL_TABLET | Freq: Every day | ORAL | 3 refills | Status: DC

## 2022-01-17 MED ORDER — BUSPIRONE HCL 7.5 MG PO TABS: 7.5000 mg | ORAL_TABLET | Freq: Three times a day (TID) | ORAL | 3 refills | Status: DC

## 2022-01-17 NOTE — Discharge Instructions (Signed)
1)Avoid ibuprofen/Advil/Aleve/Motrin/Goody Powders/Naproxen/BC powders/Meloxicam/Diclofenac/Indomethacin and other Nonsteroidal anti-inflammatory medications as these will make you more likely to bleed and can cause stomach ulcers, can also cause Kidney problems.  ? ?2)Repeat PT/INR in 2 to 3 days so that your coumadin dosage can be adjusted further ? ?3)Follow up with Grayce Sessions, NP (Primary Care Provider) for recheck in 2 to 3 days  ? ?4)You have a Mechanical Heart Valve--it is very very important that you take your Coumadin and check your PT/INR as advised to avoid complications that could be potentially Fatal/Deadly ?

## 2022-01-17 NOTE — Discharge Summary (Signed)
?                                                                                ? ? ?Theresa Crawford, is a 41 y.o. female  DOB [birth year]  MRN [id number]. ? ?Admission date:  01/12/2022  Admitting Physician  Asia B Zierle-Ghosh, DO ? ?Discharge Date:  01/17/2022  ? ?Primary MD  Grayce Sessions, NP ? ?Recommendations for primary care physician for things to follow:  ? ?1)Avoid ibuprofen/Advil/Aleve/Motrin/Goody Powders/Naproxen/BC powders/Meloxicam/Diclofenac/Indomethacin and other Nonsteroidal anti-inflammatory medications as these will make you more likely to bleed and can cause stomach ulcers, can also cause Kidney problems.  ? ?2)Repeat PT/INR in 2 to 3 days so that your coumadin dosage can be adjusted further ? ?3)Follow up with Grayce Sessions, NP (Primary Care Provider) for recheck in 2 to 3 days  ? ?4)You have a Mechanical Heart Valve--it is very very important that you take your Coumadin and check your PT/INR as advised to avoid complications that could be potentially Fatal/Deadly ? ?Admission Diagnosis  Acute pulmonary edema (HCC) [J81.0] ?CHF exacerbation (HCC) [I50.9] ?Acute respiratory failure with hypoxia (HCC) [J96.01] ?History of heart valve replacement with mechanical valve [Z95.2] ?Subtherapeutic anticoagulation [Z51.81, Z79.01] ? ? ?Discharge Diagnosis  Acute pulmonary edema (HCC) [J81.0] ?CHF exacerbation (HCC) [I50.9] ?Acute respiratory failure with hypoxia (HCC) [J96.01] ?History of heart valve replacement with mechanical valve [Z95.2] ?Subtherapeutic anticoagulation [Z51.81, Z79.01]   ? ?Principal Problem: ?  Acute HFrEF ?Active Problems: ?  Pulmonary edema ?  Tobacco abuse ?  MDD (major depressive disorder), recurrent episode (HCC) ?  History of heart valve replacement with mechanical valve ?  Subtherapeutic international normalized ratio (INR) ?  Atrial flutter with rapid ventricular response (HCC) ?    ? ?Past Medical History:  ?Diagnosis Date  ? Anxiety   ? Arthritis   ? "lower back"  (04/04/2018)  ? Bipolar disorder (HCC)   ? Chronic bronchitis (HCC)   ? Chronic diastolic CHF (congestive heart failure) (HCC)   ? Chronic lower back pain   ? DDD (degenerative disc disease), lumbar   ? Depression   ? Dyspnea   ? GERD (gastroesophageal reflux disease)   ? Headache   ? "weekly" (04/04/2018), miragrains in past (04/03/2019)  ? Heart murmur   ? Hypertension   ? Mitral valve disease   ? Normocytic anemia   ? Obesity   ? Palpitations   ? Pericarditis   ? Pneumonia   ? Premature atrial contractions   ? Pulmonary hypertension (HCC)   ? PVC's (premature ventricular contractions)   ? a. h/o palpitations with event monitor in 03/2017 showing NSR with PACs/PVCs.  ? Recurrent mitral valve stenosis and regurgitation s/p mitral valve repair   ? S/P mitral valve repair 03/27/2013  ? Dr. Darcus Austin - [place], [place] - complex valvuloplasty including resuspension of entire posterior leaflet using Gore-tex neochords and 30 mm Edwards Physio ring annuloplasty  ? S/P redo mitral valve replacement with mechanical valve 04/05/2019  ? 29 mm Sorin Carbomedics Optiform bileaflet mechanical valve  ? S/P tricuspid valve repair 04/05/2019  ? 28 mm Edwards mc3 ring annuloplasty  ? Tobacco abuse   ? Tricuspid  regurgitation   ? ? ?Past Surgical History:  ?Procedure Laterality Date  ? ABDOMINAL HERNIA REPAIR  ~ 2011  ? CARDIAC CATHETERIZATION  2014  ? CESAREAN SECTION  2004; 2009  ? HERNIA REPAIR    ? MITRAL VALVE REPAIR  03/27/2013  ? Dr. Darcus Austin - [place], [place]. - complex valvuloplasty including resuspension of posterior leaflet with 30 mm CE Physio ring annuloplasty  ? MITRAL VALVE REPLACEMENT N/A 04/05/2019  ? Procedure: Mitral Valve (Mv) Replacement using Carbomedics Optiform Valve size 49mm;  Surgeon: Purcell Nails, MD;  Location: [facility] OR;  Service: Open Heart Surgery;  Laterality: N/A;  ? MULTIPLE EXTRACTIONS WITH ALVEOLOPLASTY N/A 04/03/2018  ? Procedure: Extraction of tooth #30 with alveoloplasty and gross  debridement of remaining teeth;  Surgeon: Charlynne Pander, DDS;  Location: [facility] OR;  Service: Oral Surgery;  Laterality: N/A;  ? PARTIAL HYSTERECTOMY  2011  ? PID w/problem with fallopian tubes, had left fallopian and left ovary removed, pt still having periods  ? RIGHT HEART CATH N/A 03/27/2019  ? Procedure: RIGHT HEART CATH;  Surgeon: Laurey Morale, MD;  Location: [facility] INVASIVE CV LAB;  Service: Cardiovascular;  Laterality: N/A;  ? RIGHT/LEFT HEART CATH AND CORONARY ANGIOGRAPHY N/A 03/05/2019  ? Procedure: RIGHT/LEFT HEART CATH AND CORONARY ANGIOGRAPHY;  Surgeon: Kathleene Hazel, MD;  Location: [facility] INVASIVE CV LAB;  Service: Cardiovascular;  Laterality: N/A;  ? TEE WITHOUT CARDIOVERSION N/A 05/26/2016  ? Procedure: TRANSESOPHAGEAL ECHOCARDIOGRAM (TEE);  Surgeon: [name], MD;  Location: [facility] ENDO SUITE;  Service: Cardiovascular;  Laterality: N/A;  ? TEE WITHOUT CARDIOVERSION N/A 01/25/2018  ? Procedure: TRANSESOPHAGEAL ECHOCARDIOGRAM (TEE) WITH PROPOFOL;  Surgeon: Jonelle Sidle, MD;  Location: [facility] ENDO SUITE;  Service: Cardiovascular;  Laterality: N/A;  ? TEE WITHOUT CARDIOVERSION N/A 03/05/2019  ? Procedure: TRANSESOPHAGEAL ECHOCARDIOGRAM (TEE);  Surgeon: Pricilla Riffle, MD;  Location: [facility] ENDOSCOPY;  Service: Cardiovascular;  Laterality: N/A;  ? TEE WITHOUT CARDIOVERSION N/A 04/05/2019  ? Procedure: TRANSESOPHAGEAL ECHOCARDIOGRAM (TEE);  Surgeon: Purcell Nails, MD;  Location: [facility] OR;  Service: Open Heart Surgery;  Laterality: N/A;  ? TRICUSPID VALVE REPLACEMENT N/A 04/05/2019  ? Procedure: Tricuspid Valve Repair using Edwards MC3 Tricuspid ring size 66mm;  Surgeon: Purcell Nails, MD;  Location: [facility] OR;  Service: Open Heart Surgery;  Laterality: N/A;  ? ? ? ? ? HPI  from the history and physical done on the day of admission:  ? ?  ?HPI: Theresa Crawford is a 41 y.o. female with medical history significant of bipolar disorder, chronic CHF, depression, GERD, heart murmur, palpitations, pulmonary  hypertension, recurrent mitral valve stenosis and regurgitation status post mitral valve repair, tobacco use disorder, and more presents the ED with a chief complaint of shortness of breath and chest tightness.  Patient reports that started the night prior to presentation and continued to get worse throughout the day.  She reports that her associated chest pain is like a tightness.  She has associated palpitations, diaphoresis, headache, nausea.  Patient denies vomiting.  Patient reports that since she can hear her mechanical valve take she knows that her heart is racing and it was going very fast earlier today.  Patient reports productive cough but no fever.  Patient admits to constipation and reports that she has to strain to have a bowel movement patient has been on her medications for quite some time and is very careful about not having a doctor or access to her medications. ?  ?Patient  reports she does smoke, but does not want a nicotine patch.  She does not drink alcohol, she does not use illicit drugs.  She is not vaccinated for COVID.  Patient is full code. ? ? Hospital Course:  ? ?  ?41 y.o. female with medical history significant of bipolar disorder, chronic CHF, depression, GERD, heart murmur, palpitations, pulmonary hypertension, recurrent mitral valve stenosis and regurgitation status post mitral valve repair, tobacco use disorder, and more presents the ED with a chief complaint of shortness of breath and chest tightness.  Patient reports that started the night prior to presentation and continued to get worse throughout the day.  She reports that her associated chest pain is like a tightness.  She has associated palpitations, diaphoresis, headache, nausea.  Patient denies vomiting.  Patient reports that since she can hear her mechanical valve take she knows that her heart is racing and it was going very fast earlier today.  Patient reports productive cough but no fever.  Patient admits to constipation  and reports that she has to strain to have a bowel movement patient has been on her medications for quite some time and is very careful about not having a doctor or access to her medications. ?  ?Patient reports she

## 2022-01-17 NOTE — Progress Notes (Signed)
Nsg Discharge Note ? ?Admit Date:  01/12/2022 ?Discharge date: 01/17/2022 ?  ?Theresa Crawford to be D/C'd Home per MD order.  AVS completed.  ?Patient/caregiver able to verbalize understanding. ? ?Discharge Medication: ?Allergies as of 01/17/2022   ? ?   Reactions  ? Bee Venom Anaphylaxis  ? Lisinopril Anaphylaxis, Shortness Of Breath, Swelling, Other (See Comments)  ? Throat swells  ? Penicillins Shortness Of Breath, Swelling, Other (See Comments)  ? Has patient had a PCN reaction causing immediate rash, facial/tongue/throat swelling, SOB or lightheadedness with hypotension: Yes ?Has patient had a PCN reaction causing severe rash involving mucus membranes or skin necrosis: Yes ?Has patient had a PCN reaction that required hospitalization No ?Has patient had a PCN reaction occurring within the last 10 years: No ?If all of the above answers are "NO", then may proceed with Cephalosporin use.  ? Perflutren Lipid Microsphere Shortness Of Breath, Other (See Comments)  ? Chest Pain/Tightness, also  ? Shellfish Allergy Anaphylaxis  ? Contrast Media [iodinated Contrast Media] Hives, Itching, Other (See Comments)  ? Itching and hives to throat, neck, face and arms.   No difficulty breathing.   This was given at [facility] approximately 2015. Contrast Dye  ? ?  ? ?  ?Medication List  ?  ? ?STOP taking these medications   ? ?aspirin 81 MG EC tablet ?  ?budesonide 0.5 MG/2ML nebulizer solution ?Commonly known as: Pulmicort ?  ?budesonide 180 MCG/ACT inhaler ?Commonly known as: PULMICORT ?  ?docusate sodium 100 MG capsule ?Commonly known as: COLACE ?  ?HYDROcodone-acetaminophen 5-325 MG tablet ?Commonly known as: NORCO/VICODIN ?  ?methocarbamol 500 MG tablet ?Commonly known as: Robaxin ?  ?potassium chloride SA 20 MEQ tablet ?Commonly known as: KLOR-CON M ?  ?PROBIOTIC PO ?  ?QUEtiapine 50 MG tablet ?Commonly known as: SEROQUEL ?  ?torsemide 20 MG tablet ?Commonly known as: DEMADEX ?  ? ?  ? ?TAKE these medications    ? ?acetaminophen 325 MG tablet ?Commonly known as: TYLENOL ?Take 2 tablets (650 mg total) by mouth every 6 (six) hours as needed for mild pain. ?  ?albuterol 108 (90 Base) MCG/ACT inhaler ?Commonly known as: VENTOLIN HFA ?Inhale 2 puffs into the lungs every 6 (six) hours as needed for wheezing or shortness of breath. ?  ?busPIRone 7.5 MG tablet ?Commonly known as: BUSPAR ?Take 1 tablet (7.5 mg total) by mouth 3 (three) times daily. ?  ?ferrous sulfate 325 (65 FE) MG tablet ?Take 1 tablet (325 mg total) by mouth daily with breakfast. ?  ?folic acid 1 MG tablet ?Commonly known as: FOLVITE ?Take 1 tablet (1 mg total) by mouth daily. ?What changed:  ?medication strength ?how much to take ?  ?furosemide 40 MG tablet ?Commonly known as: Lasix ?Take 1 tablet (40 mg total) by mouth daily. ?  ?hydrOXYzine 25 MG capsule ?Commonly known as: VISTARIL ?Take 1 capsule (25 mg total) by mouth every 8 (eight) hours as needed for anxiety or itching. ?What changed: See the new instructions. ?  ?metoprolol succinate 25 MG 24 hr tablet ?Commonly known as: TOPROL-XL ?Take 1 tablet (25 mg total) by mouth daily. ?What changed: additional instructions ?  ?potassium chloride 10 MEQ tablet ?Commonly known as: KLOR-CON ?Take 1 tablet (10 mEq total) by mouth daily. Take While taking Lasix/furosemide ?  ?sertraline 50 MG tablet ?Commonly known as: Zoloft ?Take 1 tablet (50 mg total) by mouth daily. ?What changed:  ?medication strength ?how much to take ?  ?spironolactone 25 MG  tablet ?Commonly known as: ALDACTONE ?Take 1 tablet (25 mg total) by mouth in the morning. ?What changed:  ?when to take this ?additional instructions ?  ?traZODone 50 MG tablet ?Commonly known as: DESYREL ?Take 1 tablet (50 mg total) by mouth at bedtime. For sleep and mood ?What changed:  ?how much to take ?additional instructions ?  ?vitamin B-12 1000 MCG tablet ?Commonly known as: CYANOCOBALAMIN ?Take 1,000 mcg by mouth daily. ?  ?warfarin 5 MG tablet ?Commonly known  as: COUMADIN ?Take 1 tablet (5 mg total) by mouth every evening. TAKE 1 TABLET BY MOUTH ONCE DAILY ?What changed:  ?how much to take ?how to take this ?when to take this ?additional instructions ?  ? ?  ? ? ?Discharge Assessment: ?Vitals:  ? 01/17/22 0748 01/17/22 1259  ?BP:  114/65  ?Pulse:  73  ?Resp:  16  ?Temp:  98.2 ?F (36.8 ?C)  ?SpO2: 98% 97%  ? Skin clean, dry and intact without evidence of skin break down, no evidence of skin tears noted. ?IV catheter discontinued intact. Site without signs and symptoms of complications - no redness or edema noted at insertion site, patient denies c/o pain - only slight tenderness at site.  Dressing with slight pressure applied. ? ?D/c Instructions-Education: ?Discharge instructions given to patient/family with verbalized understanding. ?D/c education completed with patient/family including follow up instructions, medication list, d/c activities limitations if indicated, with other d/c instructions as indicated by MD - patient able to verbalize understanding, all questions fully answered. ?Patient instructed to return to ED, call 911, or call MD for any changes in condition.  ?Patient escorted via [place], and D/C home via private auto. ? ?Kathie Rhodes, RN ?01/17/2022 4:24 PM  ?

## 2022-01-17 NOTE — Progress Notes (Addendum)
ANTICOAGULATION CONSULT NOTE - Follow Up Consult ? ?Pharmacy Consult for heparin and warfarin ?Indication:  MVR ? ?Patient Measurements: ?Height: 5\' 4"  (162.6 cm) ?Weight: 102.6 kg (226 lb 4.8 oz) ?IBW/kg (Calculated) : 54.7 ?Heparin Dosing Weight: 80kg ? ?Labs: ?Recent Labs  ?  01/15/22 ?0615 01/16/22 ?[id number] 01/17/22 ?[id number]  ?HGB 11.5* 11.9* 12.1  ?HCT 36.4 37.4 38.7  ?PLT 535* 518* 504*  ?LABPROT 17.6* 20.8* 25.7*  ?INR 1.5* 1.8* 2.4*  ?HEPARINUNFRC 0.72* 0.67 0.69  ?CREATININE 0.84  --   --   ? ? ?Estimated Creatinine Clearance: 101.8 mL/min (by C-G formula based on SCr of 0.84 mg/dL). ? ?Assessment: ?41 yo female c/o SOB, admitted for acute pulmonary edema, to start heparin for MVR; of note pt is well known to be noncompliant with Coumadin, unclear if she is currently seeing a Coumadin clinic but was last seen in [facility] in 2020 and was discharged from that clinic in 2022.  ? ?Home dose of warfarin was 5mg  daily, patient non compliant due to running out of medication. Admit INR 1.1  ? ?INR  jumped to 2.4<1.8<1.5<1.1<1.1<1.1. Heparin is therapeutic ? ? ?Goal of Therapy:  ?INR goal 2.5-3.5  ?Heparin level 0.3-0.7 units/ml ?Monitor platelets by anticoagulation protocol: Yes ?  ?Plan:  ?Warfarin 5mg  po x1 ?Continue heparin infusion to 1650 units/hr ?Continue daily INR, HL, and CBC ? ?Lorenso Courier, PharmD ?Clinical Pharmacist ?01/17/2022 9:01 AM ? ? ? ? ? ? ? ?

## 2022-01-18 ENCOUNTER — Telehealth: Payer: Self-pay

## 2022-01-18 ENCOUNTER — Telehealth: Payer: Self-pay | Admitting: Primary Care

## 2022-01-18 ENCOUNTER — Encounter (HOSPITAL_COMMUNITY): Payer: Self-pay | Admitting: Cardiology

## 2022-01-18 NOTE — Telephone Encounter (Signed)
Transition Care Management Unsuccessful Follow-up Telephone Call ? ?Date of discharge and from where:  01/17/2022[name] ? ?Attempts:  1st Attempt ? ?Reason for unsuccessful TCM follow-up call:  Left voice message on # [phone]. Call back requested to this CM or [facility].  ?Call also placed to [phone] and the recording stated that the mailbox is invalid.  ? ?Theresa Passe, NP is listed as PCP but the patient has not seen her since 10/31/2019.  ?Patient needs INR check. This information was shared with [name], RN/ [facility].  ? ? ? ?

## 2022-01-18 NOTE — Telephone Encounter (Signed)
Copied from [place] [phone]. Topic: General - Other ?>> Jan 18, 2022  4:32 PM Yvette Rack wrote: ?Reason for CRM: Pt returned call to [facility]. Attempted to transfer pt to the office but there was no answer. Pt requests call back ?

## 2022-01-18 NOTE — Telephone Encounter (Signed)
[name], returned call left by Theresa Crawford guide for [facility]. ? ?[name] was referred to Care Connect by Theresa Crawford [facility] team on 01/13/22. [name] was discharged from hospital on 01/17/22. ? ?Medications: her Medications were sent to Theresa Crawford, but she has not been able to pick those up due to financial needs. Will follow up with cost and attempt to assist with a one time assistance to bridge until she is enrolled with Theresa Crawford. ? ?Primary Care: she is currently uninsured and has no income. She states she has no primary care provider. Client unsure of last primary care visit and with whom. ? ?Appointment made for enrollment to [facility] Connect for 01/19/22 at 11am. Will also apply for Theresa Crawford at that time. Provided her with address and contact information. ? ?Reached out to Theresa Crawford worker for Theresa Crawford, no answer by phone. Left Message to return call. [name] will need cardiology follow up as will as PT/INR  and coumadin management.  ? ?Plan: Connect client to primary care with enrollment to Care connect, Medication assistance with Theresa Crawford. Other resources as needed. ? ?Theresa Garret RN ?Theresa Crawford ?

## 2022-01-19 ENCOUNTER — Telehealth: Payer: Self-pay

## 2022-01-19 ENCOUNTER — Telehealth: Payer: Self-pay | Admitting: Licensed Clinical Social Worker

## 2022-01-19 NOTE — Telephone Encounter (Signed)
Call returned to patient.  Message left with call back requested to this CM if she has further questions about plan for follow up after hospitalization. She has been in contact with [name], RN/ [facility] for assistance with identifying a PCP in [facility] and for assistance with obtaining medications.  I see that she has been scheduled for INR draw on 01/21/2022 in [place].   ?

## 2022-01-19 NOTE — Telephone Encounter (Signed)
LCSW received message from [name] with [facility], pt in need of f/u care with cardiology and to have INR checked. I was able to coordinate w/ Vashti Hey, RN, to have pt scheduled for new coumadin appt at [facility]. Pt has been involved w/ coumadin clinic and care navigation team in the past, compliance has been a challenge. Hopefully with assistance from [facility] team pt will be able to improve access and compliance. I remain available.   ? ?Theresa Crawford, MSW, LCSW ?Clinical Social Worker II ?[place] [facility]  ?[phone]- work cell phone (preferred) ?[phone]- desk phone ? ?

## 2022-01-19 NOTE — Telephone Encounter (Signed)
Transition Care Management Unsuccessful Follow-up Telephone Call ? ?Date of discharge and from where:  01/17/2022, [facility]  ? ?Attempts:  2nd Attempt ? ?Reason for unsuccessful TCM follow-up call:  Left voice message-  with call back requested to this CM if she has further questions about plan for follow up after hospitalization. She has been in contact with Lyndee Hensen, [place] for assistance with identifying a PCP in [facility] and for assistance with obtaining medications.  She has been scheduled for [facility] draw [facility].  ? ? ? ?

## 2022-01-19 NOTE — Telephone Encounter (Signed)
Called Theresa Crawford for follow up today. She was scheduled for an 11 am with [facility] to enroll and to assist with access to a PCP in [facility] as well as medication assistance and applying for [facility]. ? ?No answer, left a voicemail requesting a return call. ? ?This am this RN connected with Octavio Graves SW and Theresa Ciccarelli was scheduled for an appointment on 01/21/22 at the Coumadin clinic in [facility] for INR and follow up.  ? ?Plan: will continue to attempt to reach client to assist with access for care and resources needed. ? ? ?Francee Nodal RN ?[employer] ?

## 2022-01-20 ENCOUNTER — Telehealth: Payer: Self-pay

## 2022-01-20 ENCOUNTER — Telehealth: Payer: Self-pay | Admitting: Licensed Clinical Social Worker

## 2022-01-20 NOTE — Telephone Encounter (Signed)
Transition Care Management Unsuccessful Follow-up Telephone Call ? ?Date of discharge and from where:  [facility]/17/2022[name] ? ?Attempts:  3rd Attempt ? ?Reason for unsuccessful TCM follow-up call:  Left voice message # [phone] with call back requested to this CM if she has further questions about plan for follow up after hospitalization.  ? ?She has been contacted by  [name], RN/ [facility] for assistance with identifying a PCP in [facility] and for assistance with obtaining medications.  She is [facility] , [facility].  ? ? ? ?

## 2022-01-20 NOTE — Telephone Encounter (Signed)
LCSW was able to reach pt via telephone today at [phone]. Introduced self, role, reason for call. Reminded pt that she has an appt to get her INR checked at [facility] coumadin clinic tomorrow. Pt inquired what time, provided time and location of appt. I shared that Trish, RN w/[facility] Connect had been attempting to reach her. I do not know the best number at this time to contact Trish, pt shares I can text her that number when I have connected with Trish.  ? ?Message sent to [facility] via Teams.  ? ?Octavio Graves, MSW, LCSW ?Clinical Social Worker II ?[place] [facility]  ?[phone]- work cell phone (preferred) ?[phone]- desk phone ? ?

## 2022-01-20 NOTE — Congregational Nurse Program (Signed)
Client in today to enroll in Care connect. [facility]. ?With assistance of Westley Hummer LCSW heartcare heart failure clinics was able to help get client an appointment at [facility] on 01/21/22 at 10 am for coumadin clinic. ?Client did not have reliable transportation, transportation secured with [employer]. ? ?[facility] guide assisted with application of Care connect, [facility] and [facility]. And will submit applications for review. ? ?Client was able to shop food market while here also. ? ?Medication insecurity: Client discharged from [facility] on 01/17/22 and medications were sent to [facility]. Client was unable to obtain medications and has not taken any medications since discharge due unable to afford.  ?[facility] Connect was able to assist with a one time assistance with medications. Medications were picked up and delivered to client during her enrollment.  ? ?Plan: will follow up by phone tomorrow to set up first appointment with [facility]. Client is agreeable. ? ?Debria Garret RN ?[employer] [employer] ?

## 2022-01-20 NOTE — Telephone Encounter (Signed)
Sent pt a text message as agreed with [facility] Connect number, as well as a second reminder of date, time, and address for coumadin check. [facility] number also provided in text. Trish, RN, [facility] Connect and Erskine Squibb, [facility], aware.  ? ?Octavio Graves, MSW, LCSW ?Clinical Social Worker II ?[place] [facility] Navigation  ?[phone]- work cell phone (preferred) ?[phone]- desk phone ? ?

## 2022-01-20 NOTE — Telephone Encounter (Signed)
Theresa Crawford called in today regarding applying for [facility] Connect and [place] [facility]. Missed appointment to enroll on 01/19/22 as transportation is difficult. ? ?Theresa Crawford is scheduled for enrollment now today at 3:30PM in the office of [facility] Connect. ?Theresa Crawford has still be unable to pick up her discharge medications due to financial needs they are not affordable. ?Cost for one month supply for all 12 medications with GoodRx is 141.66$  ?She has not taken any medications since discharge from the hospital. ? ?Plan: Will enroll client into Care Connect today and refer to a primary care provider within our network. ?Will enroll client today into Med[facility] and send application for approval. ?Will seek resource today to assist in a one time assistance for her medications if possible. ?Will discuss future needs for transportation to medical appointments at enrollment. ?Client agreeable to plan. ?Discussed that she has an appt 01/21/22 to [facility] for INR/Coumadin follow up. ? ?Francee Nodal RN ?[employer] ?

## 2022-01-21 ENCOUNTER — Encounter: Payer: Self-pay | Admitting: *Deleted

## 2022-01-21 DIAGNOSIS — Z5181 Encounter for therapeutic drug level monitoring: Secondary | ICD-10-CM | POA: Insufficient documentation

## 2022-01-22 ENCOUNTER — Telehealth: Payer: Self-pay

## 2022-01-22 NOTE — Telephone Encounter (Signed)
Attempted to call for follow up with client and to assist in scheduling an appointment to establish primary medical [facility] with [facility]. No answer left message. ? ?Also called on 01/21/22 by [employer] that they had confirmed pick up time as 9:15 am to transportat client to her Coumadin clinic appointment at 10 am in [place] and client did not come out of house. Appointment at [facility] was missed. Transportation pick up times had been confirmed with client by this RN on 01/20/22 as well as [employer] on 01/20/22 ? ? ?Will continue to attempt to connect with client to arrange appointment to establish her primary care and to discuss with her rescheduling her [facility] Cou[facility]in client appointment to follow her PT/INR. ? ? ?Francee Nodal RN ?[employer] ?

## 2022-01-25 NOTE — Progress Notes (Signed)
This encounter was created in error - please disregard.

## 2022-01-27 ENCOUNTER — Telehealth: Payer: Self-pay

## 2022-01-27 NOTE — Telephone Encounter (Signed)
Attempted to call client in order to help assist in setting up an appointment at [facility] to establish primary medical care.  ?No answer, left detailed phone message requesting a return call. ? ?Francee Nodal RN ?[employer] ?

## 2022-01-29 ENCOUNTER — Telehealth: Payer: Self-pay

## 2022-01-29 NOTE — Telephone Encounter (Signed)
Attempted call to follow up with client , and to schedule appointment to establish primary medical care. Multiple attempts have been made. No answer left voicemail requesting a return call.  Also attempted to call mother's phone listed on Care connect as an emergency contact and in Epic. Call states unable to connect.   Francee Nodal RN [employer]

## 2022-02-16 ENCOUNTER — Telehealth: Payer: Self-pay

## 2022-02-16 NOTE — Telephone Encounter (Signed)
Called client back on another alternated "work" number. She states she has had to leave to go stay with a family member for a while and rather discuss that in person.  She is taking her medications that Care Connect assisted her in obtaining after hospital discharge and she still has some, she was provided with one month's assistance on all her discharged medications.   She missed her coumadin clinic appointment on 01/21/22 at [facility], transportation was provided. Encouraged client to call today to reschedule her coumadin clinic appointment and let me know so I can help arrange transportation. Client states she will do so.  Assisted client in arranging an appointment to establish care with [facility] for [facility] care. Appointment scheduled for 01/18/22 at 1 PM . Will arrange transportation for client with [employer] and will notify client of pick up time.  Will Plan to meet with client after [facility] appointment.   [place] [employer]

## 2022-02-17 ENCOUNTER — Telehealth: Payer: Self-pay

## 2022-02-17 NOTE — Telephone Encounter (Signed)
Arranged transportation for client to [facility] appointment for [facility]/18/22 at 1pm pick up time would be 12:30 by [employer].  Client instructed to take all her medications to her appointment with her.  Also client states she has called [facility]Care to reschedule with Coumadin clinic and no one has called her back yet.   Will continue to follow.  Francee Nodal RN [employer]

## 2022-02-18 ENCOUNTER — Telehealth: Payer: Self-pay

## 2022-02-18 ENCOUNTER — Ambulatory Visit: Payer: Medicaid Other | Admitting: Physician Assistant

## 2022-02-18 NOTE — Telephone Encounter (Signed)
Attempted to call client at number reached yesterday of [phone] and says invalid mailbox and also attempted to call at [phone], left voicemail on latter requesting a return call.   Client was scheduled today at 1:00 PM at [facility] and transportation arranged with [employer], however per Epic appears client missed appointment today.   [place] [employer]

## 2022-02-22 ENCOUNTER — Telehealth: Payer: Self-pay

## 2022-02-22 ENCOUNTER — Encounter: Payer: Self-pay | Admitting: *Deleted

## 2022-02-22 NOTE — Congregational Nurse Program (Signed)
  Dept: [phone]   Congregational Nurse Program Note  Date of Encounter: 02/22/2022  Past Medical History: Past Medical History:  Diagnosis Date   Anxiety    Arthritis    "lower back" (04/04/2018)   Bipolar disorder (HCC)    Chronic bronchitis (HCC)    Chronic diastolic CHF (congestive heart failure) (HCC)    Chronic lower back pain    DDD (degenerative disc disease), lumbar    Depression    Dyspnea    GERD (gastroesophageal reflux disease)    Headache    "weekly" (04/04/2018), miragrains in past (04/03/2019)   Heart murmur    Hypertension    Mitral valve disease    Normocytic anemia    Obesity    Palpitations    Pericarditis    Pneumonia    Premature atrial contractions    Pulmonary hypertension (HCC)    PVC's (premature ventricular contractions)    a. h/o palpitations with event monitor in 03/2017 showing NSR with PACs/PVCs.   Recurrent mitral valve stenosis and regurgitation s/p mitral valve repair    S/P mitral valve repair 03/27/2013   Dr. Fransico Michael - [place], [place] - complex valvuloplasty including resuspension of entire posterior leaflet using Gore-tex neochords and 30 mm Edwards Physio ring annuloplasty   S/P redo mitral valve replacement with mechanical valve 04/05/2019   29 mm Sorin Carbomedics Optiform bileaflet mechanical valve   S/P tricuspid valve repair 04/05/2019   28 mm Edwards mc3 ring annuloplasty   Tobacco abuse    Tricuspid regurgitation     Encounter Details:  CNP Questionnaire - 02/22/22 1447       Questionnaire   Do you give verbal consent to treat you today? Yes    Location Patient [place] or Organization    Patient Status Unknown    Insurance Uninsured ([place] Card/Care Connects/Self-Pay)    Insurance Referral [place] Card/Care Connects    Medication N/A    Medical Provider Yes    Screening Referrals N/A    Medical Referral N/A    Medical Appointment Made N/A    Food N/A     Transportation N/A    Housing/Utilities N/A    Interpersonal Safety N/A    Intervention N/A    ED Visit Averted N/A    Life-Saving Intervention Made N/A

## 2022-02-22 NOTE — Telephone Encounter (Signed)
error 

## 2022-02-22 NOTE — Congregational Nurse Program (Signed)
Talked to client via telephone encounter, checking on mental health wellbeing and if client needed any assistance with resources. Client did need assistance for  utilities ,case worker refereed too utilities assistance.

## 2022-08-13 ENCOUNTER — Ambulatory Visit (INDEPENDENT_AMBULATORY_CARE_PROVIDER_SITE_OTHER): Payer: Self-pay | Admitting: *Deleted
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
## 2022-08-17 NOTE — Telephone Encounter (Signed)
Pt has been seen in over 2 years. Pt will be consider a new patient

## 2022-08-18 ENCOUNTER — Emergency Department (HOSPITAL_COMMUNITY): Payer: 59

## 2022-08-18 ENCOUNTER — Inpatient Hospital Stay (HOSPITAL_COMMUNITY)
Admission: EM | Admit: 2022-08-18 | Discharge: 2022-08-24 | DRG: 291 | Disposition: A | Discharge status: Home / Self Care | Payer: 59 | Attending: Internal Medicine | Admitting: Internal Medicine

## 2022-08-18 ENCOUNTER — Encounter (HOSPITAL_COMMUNITY): Payer: Self-pay | Admitting: Emergency Medicine

## 2022-08-18 DIAGNOSIS — Z888 Allergy status to other drugs, medicaments and biological substances status: Secondary | ICD-10-CM

## 2022-08-18 DIAGNOSIS — J90 Pleural effusion, not elsewhere classified: Secondary | ICD-10-CM | POA: Diagnosis not present

## 2022-08-18 DIAGNOSIS — Z720 Tobacco use: Secondary | ICD-10-CM | POA: Diagnosis present

## 2022-08-18 DIAGNOSIS — G8929 Other chronic pain: Secondary | ICD-10-CM | POA: Diagnosis present

## 2022-08-18 DIAGNOSIS — I2489 Other forms of acute ischemic heart disease: Secondary | ICD-10-CM | POA: Diagnosis not present

## 2022-08-18 DIAGNOSIS — Z91041 Radiographic dye allergy status: Secondary | ICD-10-CM | POA: Diagnosis not present

## 2022-08-18 DIAGNOSIS — Z6839 Body mass index (BMI) 39.0-39.9, adult: Secondary | ICD-10-CM

## 2022-08-18 DIAGNOSIS — Z91148 Patient's other noncompliance with medication regimen for other reason: Secondary | ICD-10-CM | POA: Diagnosis not present

## 2022-08-18 DIAGNOSIS — J9601 Acute respiratory failure with hypoxia: Secondary | ICD-10-CM | POA: Diagnosis present

## 2022-08-18 DIAGNOSIS — Z880 Allergy status to penicillin: Secondary | ICD-10-CM | POA: Diagnosis not present

## 2022-08-18 DIAGNOSIS — J811 Chronic pulmonary edema: Secondary | ICD-10-CM | POA: Diagnosis not present

## 2022-08-18 DIAGNOSIS — D649 Anemia, unspecified: Secondary | ICD-10-CM | POA: Diagnosis present

## 2022-08-18 DIAGNOSIS — R0602 Shortness of breath: Secondary | ICD-10-CM | POA: Diagnosis not present

## 2022-08-18 DIAGNOSIS — Z23 Encounter for immunization: Secondary | ICD-10-CM

## 2022-08-18 DIAGNOSIS — Z743 Need for continuous supervision: Secondary | ICD-10-CM | POA: Diagnosis not present

## 2022-08-18 DIAGNOSIS — R000 Tachycardia, unspecified: Secondary | ICD-10-CM | POA: Diagnosis not present

## 2022-08-18 DIAGNOSIS — R109 Unspecified abdominal pain: Secondary | ICD-10-CM | POA: Diagnosis not present

## 2022-08-18 DIAGNOSIS — F17210 Nicotine dependence, cigarettes, uncomplicated: Secondary | ICD-10-CM | POA: Diagnosis present

## 2022-08-18 DIAGNOSIS — Z79899 Other long term (current) drug therapy: Secondary | ICD-10-CM

## 2022-08-18 DIAGNOSIS — I272 Pulmonary hypertension, unspecified: Secondary | ICD-10-CM | POA: Diagnosis present

## 2022-08-18 DIAGNOSIS — Z8249 Family history of ischemic heart disease and other diseases of the circulatory system: Secondary | ICD-10-CM

## 2022-08-18 DIAGNOSIS — I5033 Acute on chronic diastolic (congestive) heart failure: Secondary | ICD-10-CM | POA: Diagnosis not present

## 2022-08-18 DIAGNOSIS — E669 Obesity, unspecified: Secondary | ICD-10-CM | POA: Diagnosis not present

## 2022-08-18 DIAGNOSIS — I10 Essential (primary) hypertension: Secondary | ICD-10-CM | POA: Diagnosis present

## 2022-08-18 DIAGNOSIS — F319 Bipolar disorder, unspecified: Secondary | ICD-10-CM | POA: Diagnosis present

## 2022-08-18 DIAGNOSIS — Z952 Presence of prosthetic heart valve: Secondary | ICD-10-CM

## 2022-08-18 DIAGNOSIS — I5023 Acute on chronic systolic (congestive) heart failure: Secondary | ICD-10-CM | POA: Diagnosis not present

## 2022-08-18 DIAGNOSIS — Z1152 Encounter for screening for COVID-19: Secondary | ICD-10-CM

## 2022-08-18 DIAGNOSIS — F418 Other specified anxiety disorders: Secondary | ICD-10-CM | POA: Diagnosis not present

## 2022-08-18 DIAGNOSIS — Z91030 Bee allergy status: Secondary | ICD-10-CM | POA: Diagnosis not present

## 2022-08-18 DIAGNOSIS — F419 Anxiety disorder, unspecified: Secondary | ICD-10-CM | POA: Diagnosis present

## 2022-08-18 DIAGNOSIS — R0902 Hypoxemia: Secondary | ICD-10-CM | POA: Diagnosis not present

## 2022-08-18 DIAGNOSIS — F1410 Cocaine abuse, uncomplicated: Secondary | ICD-10-CM | POA: Diagnosis not present

## 2022-08-18 DIAGNOSIS — M5136 Other intervertebral disc degeneration, lumbar region: Secondary | ICD-10-CM | POA: Diagnosis present

## 2022-08-18 DIAGNOSIS — I5043 Acute on chronic combined systolic (congestive) and diastolic (congestive) heart failure: Secondary | ICD-10-CM | POA: Diagnosis present

## 2022-08-18 DIAGNOSIS — Z91013 Allergy to seafood: Secondary | ICD-10-CM

## 2022-08-18 DIAGNOSIS — J42 Unspecified chronic bronchitis: Secondary | ICD-10-CM | POA: Diagnosis not present

## 2022-08-18 DIAGNOSIS — F1910 Other psychoactive substance abuse, uncomplicated: Secondary | ICD-10-CM | POA: Diagnosis present

## 2022-08-18 DIAGNOSIS — I509 Heart failure, unspecified: Secondary | ICD-10-CM | POA: Diagnosis not present

## 2022-08-18 DIAGNOSIS — K219 Gastro-esophageal reflux disease without esophagitis: Secondary | ICD-10-CM | POA: Diagnosis present

## 2022-08-18 DIAGNOSIS — I110 Hypertensive heart disease with heart failure: Principal | ICD-10-CM | POA: Diagnosis present

## 2022-08-18 DIAGNOSIS — Z7901 Long term (current) use of anticoagulants: Secondary | ICD-10-CM

## 2022-08-18 DIAGNOSIS — R69 Illness, unspecified: Secondary | ICD-10-CM | POA: Diagnosis not present

## 2022-08-18 LAB — COMPREHENSIVE METABOLIC PANEL
ALT: 23 U/L (ref 0–44)
AST: 25 U/L (ref 15–41)
Albumin: 3.4 g/dL — ABNORMAL LOW (ref 3.5–5.0)
Alkaline Phosphatase: 65 U/L (ref 38–126)
Anion gap: 6 (ref 5–15)
BUN: 12 mg/dL (ref 6–20)
CO2: 22 mmol/L (ref 22–32)
Calcium: 8.2 mg/dL — ABNORMAL LOW (ref 8.9–10.3)
Chloride: 109 mmol/L (ref 98–111)
Creatinine, Ser: 0.75 mg/dL (ref 0.44–1.00)
GFR, Estimated: >60 mL/min (ref >60)
Glucose, Bld: 88 mg/dL (ref 70–99)
Potassium: 3.7 mmol/L (ref 3.5–5.1)
Sodium: 137 mmol/L (ref 135–145)
Total Bilirubin: 0.7 mg/dL (ref 0.3–1.2)
Total Protein: 7 g/dL (ref 6.5–8.1)

## 2022-08-18 LAB — CBC WITH DIFFERENTIAL/PLATELET
Abs Immature Granulocytes: 0.03 10*3/uL (ref 0.00–0.07)
Basophils Absolute: 0.1 10*3/uL (ref 0.0–0.1)
Basophils Relative: 1 %
Eosinophils Absolute: 0.1 10*3/uL (ref 0.0–0.5)
Eosinophils Relative: 2 %
HCT: 35.2 % — ABNORMAL LOW (ref 36.0–46.0)
Hemoglobin: 11 g/dL — ABNORMAL LOW (ref 12.0–15.0)
Immature Granulocytes: 0 %
Lymphocytes Relative: 22 %
Lymphs Abs: 1.6 10*3/uL (ref 0.7–4.0)
MCH: 25.4 pg — ABNORMAL LOW (ref 26.0–34.0)
MCHC: 31.3 g/dL (ref 30.0–36.0)
MCV: 81.3 fL (ref 80.0–100.0)
Monocytes Absolute: 0.5 10*3/uL (ref 0.1–1.0)
Monocytes Relative: 7 %
Neutro Abs: 5 10*3/uL (ref 1.7–7.7)
Neutrophils Relative %: 68 %
Platelets: 322 10*3/uL (ref 150–400)
RBC: 4.33 MIL/uL (ref 3.87–5.11)
RDW: 17.7 % — ABNORMAL HIGH (ref 11.5–15.5)
WBC: 7.4 10*3/uL (ref 4.0–10.5)
nRBC: 0 % (ref 0.0–0.2)

## 2022-08-18 LAB — RESP PANEL BY RT-PCR (FLU A&B, COVID) ARPGX2
Influenza A by PCR: NEGATIVE
Influenza B by PCR: NEGATIVE
SARS Coronavirus 2 by RT PCR: NEGATIVE

## 2022-08-18 LAB — URINALYSIS, ROUTINE W REFLEX MICROSCOPIC
Bilirubin Urine: NEGATIVE
Glucose, UA: NEGATIVE mg/dL
Ketones, ur: NEGATIVE mg/dL
Leukocytes,Ua: NEGATIVE
Nitrite: NEGATIVE
Protein, ur: NEGATIVE mg/dL
Specific Gravity, Urine: 1.008 (ref 1.005–1.030)
pH: 6 (ref 5.0–8.0)

## 2022-08-18 LAB — RAPID URINE DRUG SCREEN, HOSP PERFORMED
Amphetamines: NOT DETECTED
Barbiturates: NOT DETECTED
Benzodiazepines: NOT DETECTED
Cocaine: POSITIVE — AB
Opiates: NOT DETECTED
Tetrahydrocannabinol: NOT DETECTED

## 2022-08-18 LAB — LACTIC ACID, PLASMA
Lactic Acid, Venous: 0.7 mmol/L (ref 0.5–1.9)
Lactic Acid, Venous: 0.8 mmol/L (ref 0.5–1.9)

## 2022-08-18 LAB — PROTIME-INR
INR: 1.2 (ref 0.8–1.2)
Prothrombin Time: 15.4 seconds — ABNORMAL HIGH (ref 11.4–15.2)

## 2022-08-18 LAB — TROPONIN I (HIGH SENSITIVITY): Troponin I (High Sensitivity): 29 ng/L — ABNORMAL HIGH (ref <18)

## 2022-08-18 LAB — BRAIN NATRIURETIC PEPTIDE: B Natriuretic Peptide: 341 pg/mL — ABNORMAL HIGH (ref 0.0–100.0)

## 2022-08-18 MED ORDER — WARFARIN SODIUM 5 MG PO TABS
5.0000 mg | ORAL_TABLET | Freq: Once | ORAL | Status: AC
  Administered 2022-08-18: 5 mg via ORAL
  Filled 2022-08-18: qty 1

## 2022-08-18 MED ORDER — HEPARIN (PORCINE) 25000 UT/250ML-% IV SOLN
1600.0000 [IU]/h | INTRAVENOUS | Status: DC
  Administered 2022-08-18: 1100 [IU]/h via INTRAVENOUS
  Administered 2022-08-20 – 2022-08-22 (×4): 1750 [IU]/h via INTRAVENOUS
  Administered 2022-08-23: 1600 [IU]/h via INTRAVENOUS
  Filled 2022-08-18 (×8): qty 250

## 2022-08-18 MED ORDER — FUROSEMIDE 10 MG/ML IJ SOLN
40.0000 mg | Freq: Once | INTRAMUSCULAR | Status: AC
  Administered 2022-08-18: 40 mg via INTRAVENOUS
  Filled 2022-08-18: qty 4

## 2022-08-18 MED ORDER — HEPARIN BOLUS VIA INFUSION
4000.0000 [IU] | Freq: Once | INTRAVENOUS | Status: AC
  Administered 2022-08-18: 4000 [IU] via INTRAVENOUS

## 2022-08-18 MED ORDER — WARFARIN - PHARMACIST DOSING INPATIENT: Freq: Every day | Status: DC

## 2022-08-18 NOTE — ED Provider Notes (Signed)
[facility] Provider Note   CSN: [id number] Arrival date & time: 08/18/22  2041     History Past medical history of hypertension, history of mitral/tricuspid valve repair with mechanical valve on warfarin, pulmonary hypertension, HFrEF, GERD, anxiety, obesity, tobacco abuse Chief Complaint  Patient presents with   Multiple Complaints    Norwood LevoLathasha Y Crawford is a 41 y.o. female.  Patient presented to the ED with multiple complaints.  She says over the past 3 days she has had worsening shortness of breath with chest tightness and pain that feels like squeezing.  This is worse with taking deep breaths and with coughing.  She has had a productive cough with yellow sputum, low-grade fevers at home as well as chills.  She says she feels like she is swelling all over including in her face and in her abdomen.  She says her legs are typically the last that will swell and a CHF exacerbation these have been fine.  She has a history of mechanical valve repair and was supposed to be on Coumadin, however has not been on this medication since last May due to not being on to afford it.  She also reports being noncompliant with Lasix therapy and other blood pressure medications.  HPI     Home Medications Prior to Admission medications   Medication Sig Start Date End Date Taking? Authorizing Provider  acetaminophen (TYLENOL) 325 MG tablet Take 2 tablets (650 mg total) by mouth every 6 (six) hours as needed for mild pain. 04/13/19   Ardelle BallsZimmerman, Donielle M, PA-C  albuterol (VENTOLIN HFA) 108 (90 Base) MCG/ACT inhaler Inhale 2 puffs into the lungs every 6 (six) hours as needed for wheezing or shortness of breath. 01/17/22   Shon HaleEmokpae, Courage, MD  busPIRone (BUSPAR) 7.5 MG tablet Take 1 tablet (7.5 mg total) by mouth 3 (three) times daily. 01/17/22   Shon HaleEmokpae, Courage, MD  ferrous sulfate 325 (65 FE) MG tablet Take 1 tablet (325 mg total) by mouth daily with breakfast. 01/17/22   Shon HaleEmokpae, Courage, MD   folic acid (FOLVITE) 1 MG tablet Take 1 tablet (1 mg total) by mouth daily. 01/17/22 01/17/23  Shon HaleEmokpae, Courage, MD  furosemide (LASIX) 40 MG tablet Take 1 tablet (40 mg total) by mouth daily. 01/17/22 01/17/23  Shon HaleEmokpae, Courage, MD  hydrOXYzine (VISTARIL) 25 MG capsule Take 1 capsule (25 mg total) by mouth every 8 (eight) hours as needed for anxiety or itching. 01/17/22   Shon HaleEmokpae, Courage, MD  metoprolol succinate (TOPROL-XL) 25 MG 24 hr tablet Take 1 tablet (25 mg total) by mouth daily. 01/17/22   Shon HaleEmokpae, Courage, MD  potassium chloride (KLOR-CON) 10 MEQ tablet Take 1 tablet (10 mEq total) by mouth daily. Take While taking Lasix/furosemide 01/17/22   Shon HaleEmokpae, Courage, MD  sertraline (ZOLOFT) 50 MG tablet Take 1 tablet (50 mg total) by mouth daily. 01/17/22 01/17/23  Shon HaleEmokpae, Courage, MD  spironolactone (ALDACTONE) 25 MG tablet Take 1 tablet (25 mg total) by mouth in the morning. 01/17/22   Shon HaleEmokpae, Courage, MD  traZODone (DESYREL) 50 MG tablet Take 1 tablet (50 mg total) by mouth at bedtime. For sleep and mood 01/17/22   Shon HaleEmokpae, Courage, MD  vitamin B-12 (CYANOCOBALAMIN) 1000 MCG tablet Take 1,000 mcg by mouth daily.    [provider]  warfarin (COUMADIN) 5 MG tablet Take 1 tablet (5 mg total) by mouth every evening. TAKE 1 TABLET BY MOUTH ONCE DAILY 01/17/22   Shon HaleEmokpae, Courage, MD      Allergies  Bee venom, Lisinopril, Penicillins, Perflutren lipid microsphere, Shellfish allergy, and Contrast media [iodinated contrast media]    Review of Systems   Review of Systems  Constitutional:  Positive for chills.  HENT:  Positive for facial swelling.   Respiratory:  Positive for cough and shortness of breath.   Cardiovascular:  Positive for chest pain and leg swelling.  Gastrointestinal:  Positive for abdominal distention.  All other systems reviewed and are negative.   Physical Exam Updated Vital Signs BP (!) 128/93 (BP Location: Left Arm)   Pulse (!) 116   Temp 98.5 F (36.9 C) (Oral)   Resp (!) 25    Ht 5\' 4"  (1.626 m)   Wt 102.6 kg   SpO2 96%   BMI 38.83 kg/m  Physical Exam Vitals and nursing note reviewed.  Constitutional:      General: She is not in acute distress.    Appearance: Normal appearance. She is well-developed. She is not ill-appearing, toxic-appearing or diaphoretic.  HENT:     Head: Normocephalic and atraumatic.     Nose: No nasal deformity.     Mouth/Throat:     Lips: Pink. No lesions.  Eyes:     General: Gaze aligned appropriately. No scleral icterus.       Right eye: No discharge.        Left eye: No discharge.     Conjunctiva/sclera: Conjunctivae normal.     Right eye: Right conjunctiva is not injected. No exudate or hemorrhage.    Left eye: Left conjunctiva is not injected. No exudate or hemorrhage. Cardiovascular:     Rate and Rhythm: Regular rhythm. Tachycardia present.     Pulses: Normal pulses.          Radial pulses are 2+ on the right side and 2+ on the left side.       Dorsalis pedis pulses are 2+ on the right side and 2+ on the left side.     Heart sounds: Normal heart sounds. No murmur heard.    No friction rub. No gallop. No S3 or S4 sounds.  Pulmonary:     Effort: Pulmonary effort is normal. No respiratory distress.     Breath sounds: Normal breath sounds. No stridor. No decreased breath sounds, wheezing, rhonchi or rales.     Comments: On 2 L O2 Abdominal:     General: Abdomen is flat. There is no distension.     Palpations: Abdomen is soft. There is no mass.     Tenderness: There is no abdominal tenderness. There is no right CVA tenderness, left CVA tenderness, guarding or rebound.     Hernia: No hernia is present.  Musculoskeletal:     Right lower leg: 1+ Edema present.     Left lower leg: 1+ Edema present.  Skin:    General: Skin is warm and dry.  Neurological:     Mental Status: She is alert and oriented to person, place, and time.  Psychiatric:        Mood and Affect: Mood normal.        Speech: Speech normal.         Behavior: Behavior normal. Behavior is cooperative.     ED Results / Procedures / Treatments   Labs (all labs ordered are listed, but only abnormal results are displayed) Labs Reviewed  COMPREHENSIVE METABOLIC PANEL - Abnormal; Notable for the following components:      Result Value   Calcium 8.2 (*)    Albumin 3.4 (*)  All other components within normal limits  CBC WITH DIFFERENTIAL/PLATELET - Abnormal; Notable for the following components:   Hemoglobin 11.0 (*)    HCT 35.2 (*)    MCH 25.4 (*)    RDW 17.7 (*)    All other components within normal limits  BRAIN NATRIURETIC PEPTIDE - Abnormal; Notable for the following components:   B Natriuretic Peptide 341.0 (*)    All other components within normal limits  PROTIME-INR - Abnormal; Notable for the following components:   Prothrombin Time 15.4 (*)    All other components within normal limits  TROPONIN I (HIGH SENSITIVITY) - Abnormal; Notable for the following components:   Troponin I (High Sensitivity) 29 (*)    All other components within normal limits  RESP PANEL BY RT-PCR (FLU A&B, COVID) ARPGX2  LACTIC ACID, PLASMA  URINALYSIS, ROUTINE W REFLEX MICROSCOPIC  LACTIC ACID, PLASMA  RAPID URINE DRUG SCREEN, HOSP PERFORMED  TROPONIN I (HIGH SENSITIVITY)    EKG EKG Interpretation  Date/Time:  Wednesday August 18 2022 21:01:25 EST Ventricular Rate:  116 PR Interval:  136 QRS Duration: 94 QT Interval:  351 QTC Calculation: 488 R Axis:   61 Text Interpretation: Sinus tachycardia Borderline prolonged QT interval Confirmed by Eber Hong ([id number]) on 08/18/2022 10:10:52 PM  Radiology DG Chest 2 View  Result Date: 08/18/2022 CLINICAL DATA:  Shortness of breath, history of CHF. EXAM: CHEST - 2 VIEW COMPARISON:  Chest radiograph dated Jan 14, 2022 FINDINGS: The heart is enlarged. Sternotomy wires and prior carotid valve replacement. Bilateral perihilar opacities suggesting pulmonary edema. Small bilateral pleural effusions.  No acute osseous abnormality. IMPRESSION: Cardiomegaly with pulmonary edema and small bilateral pleural effusions. Electronically Signed   By: Larose Hires D.O.   On: 08/18/2022 21:44    Procedures .Critical Care  Performed by: Claudie Leach, PA-C Authorized by: Claudie Leach, PA-C   Critical care provider statement:    Critical care time (minutes):  30   Critical care time was exclusive of:  Separately billable procedures and treating other patients   Critical care was necessary to treat or prevent imminent or life-threatening deterioration of the following conditions:  Respiratory failure   Critical care was time spent personally by me on the following activities:  Blood draw for specimens, development of treatment plan with patient or surrogate, discussions with consultants, discussions with primary provider, evaluation of patient's response to treatment, examination of patient, obtaining history from patient or surrogate, review of old charts, re-evaluation of patient's condition, ordering and review of laboratory studies, ordering and review of radiographic studies, pulse oximetry and ordering and performing treatments and interventions   Care discussed with: admitting provider     This patient was on telemetry or cardiac monitoring during their time in the ED.    Medications Ordered in ED Medications  furosemide (LASIX) injection 40 mg (40 mg Intravenous Given 08/18/22 2204)    ED Course/ Medical Decision Making/ A&P Clinical Course as of 08/18/22 2235  Wed Aug 18, 2022  2234 Dr. Carren Rang to accept to service [GL]    Clinical Course User Index [GL] Victorino Dike Finis Bud, PA-C                           Medical Decision Making Amount and/or Complexity of Data Reviewed Labs: ordered. Radiology: ordered.  Risk Prescription drug management. Decision regarding hospitalization.    MDM  This is a 41 y.o. female who presents to the ED  with shortness of breath and chest  tightness The differential of this patient includes but is not limited to ACS, PTX, PE, Aortic Dissection, CHF, Viral illness, PNA, Pulmonary Edema  Initial Impression  Patient notably anxious, tachycardic, no acute respiratory distress, requiring 2 L of O2 for hypoxia to 89% on arrival.   I personally ordered, reviewed, and interpreted all laboratory work and imaging and agree with radiologist interpretation. Results interpreted below:  EKG ST CBC no leukocytosis, hgb 11 CMP with no renal failure, lfts normal, no significant electrolyte abnormalities BNP 341, troponin 29 Viral panel negative Lactate negative CXR with pulmonary edema and bilateral effusions  Assessment/Plan:  Acute hypoxic respiratory failure likely due to heart failure exacerbation. She is not compliant with home lasix or coumadin. She uses daily cocaine. She has pulmonary edema on her chest x ray with elevated BNP from baseline. She does have a trop leak likely due to demand ischemia. Ordered 40 mg of Lasix.  Plan to also start heparin gtt to bridge to coumadin. I considered CTA imaging, but presentation more consistent with CHF and she will be anticoagulated regardless of imaging.  Plan to admit for diuresis.  Charting Requirements Additional history is obtained from:  Independent historian External Records from outside source obtained and reviewed including: past admission in May of 2023 Social Determinants of Health:  none Pertinant PMH that complicates patient's illness: CHF, hx of mechanical heart valve  Patient Care Problems that were addressed during this visit: - CHF exacerbation: Chronic illness with exacerbation, progression, or side effects of treatment This patient was maintained on a cardiac monitor/telemetry. I personally viewed and interpreted the cardiac monitor which reveals an underlying rhythm of ST Medications given in ED: 40 mg Lasix, heparin gtt Reevaluation of the patient after these medicines  showed that the patient stayed the same I have reviewed home medications and made changes accordingly.  Critical Care Interventions: oxygen therapy Consultations: THS Disposition: admit  This is a shared visit with my attending physician, Dr. Hyacinth Meeker.  We have discussed this patient and they have independently evaluated this patient. The plan was altered or changed as needed.  Portions of this note were generated with Scientist, clinical (histocompatibility and immunogenetics). Dictation errors may occur despite best attempts at proofreading.   Final Clinical Impression(s) / ED Diagnoses Final diagnoses:  Acute on chronic congestive heart failure, unspecified heart failure type [facility])    Rx / DC Orders ED Discharge Orders     None         Claudie Leach, PA-C 08/18/22 2235    Eber Hong, MD 08/19/22 [phone]

## 2022-08-18 NOTE — Progress Notes (Addendum)
ANTICOAGULATION CONSULT NOTE - Initial Consult  Pharmacy Consult for heparin/warfarin Indication:  mechanical heart valve  Allergies  Allergen Reactions   Bee Venom Anaphylaxis   Lisinopril Anaphylaxis, Shortness Of Breath, Swelling and Other (See Comments)    Throat swells   Penicillins Shortness Of Breath, Swelling and Other (See Comments)    Has patient had a PCN reaction causing immediate rash, facial/tongue/throat swelling, SOB or lightheadedness with hypotension: Yes Has patient had a PCN reaction causing severe rash involving mucus membranes or skin necrosis: Yes Has patient had a PCN reaction that required hospitalization No Has patient had a PCN reaction occurring within the last 10 years: No If all of the above answers are "NO", then may proceed with Cephalosporin use.    Perflutren Lipid Microsphere Shortness Of Breath and Other (See Comments)    Chest Pain/Tightness, also   Shellfish Allergy Anaphylaxis   Contrast Media [Iodinated Contrast Media] Hives, Itching and Other (See Comments)    Itching and hives to throat, neck, face and arms.   No difficulty breathing.   This was given at [facility] approximately 2015. Contrast Dye    Patient Measurements: Height: 5\' 4"  (162.6 cm) Weight: 102.6 kg (226 lb 3.1 oz) IBW/kg (Calculated) : 54.7 Heparin Dosing Weight: 78  Vital Signs: Temp: 98.5 F (36.9 C) (12/06 2059) Temp Source: Oral (12/06 2059) BP: 128/93 (12/06 2055) Pulse Rate: 116 (12/06 2055)  Labs: Recent Labs    08/18/22 2125  HGB 11.0*  HCT 35.2*  PLT 322  LABPROT 15.4*  INR 1.2  CREATININE 0.75  TROPONINIHS 29*    Estimated Creatinine Clearance: 106.9 mL/min (by C-G formula based on SCr of 0.75 mg/dL).   Medical History: Past Medical History:  Diagnosis Date   Anxiety    Arthritis    "lower back" (04/04/2018)   Bipolar disorder (HCC)    Chronic bronchitis (HCC)    Chronic diastolic CHF (congestive heart failure) (HCC)     Chronic lower back pain    DDD (degenerative disc disease), lumbar    Depression    Dyspnea    GERD (gastroesophageal reflux disease)    Headache    "weekly" (04/04/2018), miragrains in past (04/03/2019)   Heart murmur    Hypertension    Mitral valve disease    Normocytic anemia    Obesity    Palpitations    Pericarditis    Pneumonia    Premature atrial contractions    Pulmonary hypertension (HCC)    PVC's (premature ventricular contractions)    a. h/o palpitations with event monitor in 03/2017 showing NSR with PACs/PVCs.   Recurrent mitral valve stenosis and regurgitation s/p mitral valve repair    S/P mitral valve repair 03/27/2013   Dr. 03/29/2013 - Darcus Austin, PA - complex valvuloplasty including resuspension of entire posterior leaflet using Gore-tex neochords and 30 mm Edwards Physio ring annuloplasty   S/P redo mitral valve replacement with mechanical valve 04/05/2019   29 mm Sorin Carbomedics Optiform bileaflet mechanical valve   S/P tricuspid valve repair 04/05/2019   28 mm Edwards mc3 ring annuloplasty   Tobacco abuse    Tricuspid regurgitation      Assessment: 41 year old female with history of mitral/tricuspid valve repair currently with mechanical mitral valve supposed to be on warfarin prior to admit but not currently due to issues with cost. INR 1.2 tonight. Orders to start heparin bridge to warfarin. CBC within normal limits.   Last outpatient INR visit was 06/15/21, but records  do show warfarin dispensed 07/23/22.   Goal of Therapy:  Heparin level 0.3-0.7 units/ml INR 2.5-3.5 Monitor platelets by anticoagulation protocol: Yes   Plan:  Give 4000 units bolus x 1 Start heparin infusion at 1100 units/hr Check anti-Xa level in 6 hours and daily while on heparin Continue to monitor H&H and platelets Daily INR Warfarin 5mg  tonight  PharmD., BCPS Clinical Pharmacist 08/18/2022 10:59 PM

## 2022-08-18 NOTE — H&P (Signed)
History and Physical  Theresa Crawford NWG:[id number] DOB: May 12, 1981 DOA: 08/18/2022  Referring physician: Theophilus Kinds PA-C PCP: Patient, No Pcp Per   Chief Complaint: Shortness of Breath  HPI: Theresa Crawford is a 41 y.o. female with a history of CHF, mitral valve replacement, tricuspid repair, chronic anemia, and depression/anxiety who presented to the ED on 08/18/22 for shortness of breath. She has not been compliant with her warfarin outpatient. Her symptoms started a few days ago. She was recently switched to Lasix from Bumex and she has felt worse since that change and believes it may not be working as well. She is unsure why she was switched. She has some back pain on the L in the lumbar region tender to palpation. Denies other symptoms. She is currently on 2L [place] which she is not on at home.   ED Course: BNP was 341, Troponin 29, hgb 11.0, PTT 15.4 with INR 1.2, Tox screen positive for cocaine. CXR showed Cardiomegaly with pulmonary edema and small bilateral pleural effusions. ECG showed sinus tachycardia. She was placed on 2L [place] and given lasix for diuresis. She was placed on heparin to bridge to warfarin.  Review of Systems: All systems reviewed and apart from history of presenting illness, are negative.  Past Medical History:  Diagnosis Date   Anxiety    Arthritis    "lower back" (04/04/2018)   Bipolar disorder (HCC)    Chronic bronchitis (HCC)    Chronic diastolic CHF (congestive heart failure) (HCC)    Chronic lower back pain    DDD (degenerative disc disease), lumbar    Depression    Dyspnea    GERD (gastroesophageal reflux disease)    Headache    "weekly" (04/04/2018), miragrains in past (04/03/2019)   Heart murmur    Hypertension    Mitral valve disease    Normocytic anemia    Obesity    Palpitations    Pericarditis    Pneumonia    Premature atrial contractions    Pulmonary hypertension (HCC)    PVC's (premature ventricular contractions)    a. h/o  palpitations with event monitor in 03/2017 showing NSR with PACs/PVCs.   Recurrent mitral valve stenosis and regurgitation s/p mitral valve repair    S/P mitral valve repair 03/27/2013   Dr. Darcus Austin - [place], [place] - complex valvuloplasty including resuspension of entire posterior leaflet using Gore-tex neochords and 30 mm Edwards Physio ring annuloplasty   S/P redo mitral valve replacement with mechanical valve 04/05/2019   29 mm Sorin Carbomedics Optiform bileaflet mechanical valve   S/P tricuspid valve repair 04/05/2019   28 mm Edwards mc3 ring annuloplasty   Tobacco abuse    Tricuspid regurgitation    Past Surgical History:  Procedure Laterality Date   ABDOMINAL HERNIA REPAIR  ~ 2011   CARDIAC CATHETERIZATION  2014   CARDIOVERSION N/A 01/14/2022   Procedure: CARDIOVERSION;  Surgeon: Antoine Poche, MD;  Location: [facility];  Service: Endoscopy;  Laterality: N/A;   CESAREAN SECTION  2004; 2009   HERNIA REPAIR     MITRAL VALVE REPAIR  03/27/2013   Dr. Darcus Austin - [place], [place]. - complex valvuloplasty including resuspension of posterior leaflet with 30 mm CE Physio ring annuloplasty   MITRAL VALVE REPLACEMENT N/A 04/05/2019   Procedure: Mitral Valve (Mv) Replacement using Carbomedics Optiform Valve size 36mm;  Surgeon: Purcell Nails, MD;  Location: [facility] OR;  Service: Open Heart Surgery;  Laterality: N/A;   MULTIPLE EXTRACTIONS WITH ALVEOLOPLASTY  N/A 04/03/2018   Procedure: Extraction of tooth #30 with alveoloplasty and gross debridement of remaining teeth;  Surgeon: Charlynne Pander, DDS;  Location: [facility] OR;  Service: Oral Surgery;  Laterality: N/A;   PARTIAL HYSTERECTOMY  2011   PID w/problem with fallopian tubes, had left fallopian and left ovary removed, pt still having periods   RIGHT HEART CATH N/A 03/27/2019   Procedure: RIGHT HEART CATH;  Surgeon: Laurey Morale, MD;  Location: [facility] INVASIVE CV LAB;  Service: Cardiovascular;  Laterality: N/A;   RIGHT/LEFT HEART CATH  AND CORONARY ANGIOGRAPHY N/A 03/05/2019   Procedure: RIGHT/LEFT HEART CATH AND CORONARY ANGIOGRAPHY;  Surgeon: Kathleene Hazel, MD;  Location: [facility] INVASIVE CV LAB;  Service: Cardiovascular;  Laterality: N/A;   TEE WITHOUT CARDIOVERSION N/A 05/26/2016   Procedure: TRANSESOPHAGEAL ECHOCARDIOGRAM (TEE);  Surgeon: [name], MD;  Location: [facility] END[facility] SUITE;  Service: Cardiovascular;  Laterality: N/A;   TEE WITHOUT CARDIOVERSION N/A 01/25/2018   Procedure: TRANSESOPHAGEAL ECHOCARDIOGRAM (TEE) WITH PROPOFOL;  Surgeon: Jonelle Sidle, MD;  Location: [facility];  Service: Cardiovascular;  Laterality: N/A;   TEE WITHOUT CARDIOVERSION N/A 03/05/2019   Procedure: TRANSESOPHAGEAL ECHOCARDIOGRAM (TEE);  Surgeon: Pricilla Riffle, MD;  Location: [facility] ENDOSCOPY;  Service: Cardiovascular;  Laterality: N/A;   TEE WITHOUT CARDIOVERSION N/A 04/05/2019   Procedure: TRANSESOPHAGEAL ECHOCARDIOGRAM (TEE);  Surgeon: Purcell Nails, MD;  Location: [facility] OR;  Service: Open Heart Surgery;  Laterality: N/A;   TEE WITHOUT CARDIOVERSION N/A 01/14/2022   Procedure: TRANSESOPHAGEAL ECHOCARDIOGRAM (TEE);  Surgeon: Antoine Poche, MD;  Location: [facility];  Service: Endoscopy;  Laterality: N/A;   TRICUSPID VALVE REPLACEMENT N/A 04/05/2019   Procedure: Tricuspid Valve Repair using Edwards MC3 Tricuspid ring size 62mm;  Surgeon: Purcell Nails, MD;  Location: [facility] [place] OR;  Service: Open Heart Surgery;  Laterality: N/A;   Social History:  reports that she has been smoking cigarettes. She started smoking about 22 years ago. She has a 12.00 pack-year smoking history. She has never used smokeless tobacco. She reports current alcohol use. She reports current drug use. Drug: Marijuana.  Allergies  Allergen Reactions   Bee Venom Anaphylaxis   Lisinopril Anaphylaxis, Shortness Of Breath, Swelling and Other (See Comments)    Throat swells   Penicillins Shortness Of Breath, Swelling and Other (See Comments)    Has patient had a PCN  reaction causing immediate rash, facial/tongue/throat swelling, SOB or lightheadedness with hypotension: Yes Has patient had a PCN reaction causing severe rash involving mucus membranes or skin necrosis: Yes Has patient had a PCN reaction that required hospitalization No Has patient had a PCN reaction occurring within the last 10 years: No If all of the above answers are "NO", then may proceed with Cephalosporin use.    Perflutren Lipid Microsphere Shortness Of Breath and Other (See Comments)    Chest Pain/Tightness, also   Shellfish Allergy Anaphylaxis   Contrast Media [Iodinated Contrast Media] Hives, Itching and Other (See Comments)    Itching and hives to throat, neck, face and arms.   No difficulty breathing.   This was given at [facility] approximately 2015. Contrast Dye    Family History  Problem Relation Age of Onset   Heart failure Father        just received LVAD   Heart disease Paternal Grandfather        stent placement    Prior to Admission medications   Medication Sig Start Date End Date Taking? Authorizing Provider  acetaminophen (  TYLENOL) 325 MG tablet Take 2 tablets (650 mg total) by mouth every 6 (six) hours as needed for mild pain. 04/13/19   Ardelle Balls, PA-C  albuterol (VENTOLIN HFA) 108 (90 Base) MCG/ACT inhaler Inhale 2 puffs into the lungs every 6 (six) hours as needed for wheezing or shortness of breath. 01/17/22   Shon Hale, MD  busPIRone (BUSPAR) 7.5 MG tablet Take 1 tablet (7.5 mg total) by mouth 3 (three) times daily. 01/17/22   Shon Hale, MD  ferrous sulfate 325 (65 FE) MG tablet Take 1 tablet (325 mg total) by mouth daily with breakfast. 01/17/22   Shon Hale, MD  folic acid (FOLVITE) 1 MG tablet Take 1 tablet (1 mg total) by mouth daily. 01/17/22 01/17/23  Shon Hale, MD  furosemide (LASIX) 40 MG tablet Take 1 tablet (40 mg total) by mouth daily. 01/17/22 01/17/23  Shon Hale, MD  hydrOXYzine (VISTARIL) 25 MG  capsule Take 1 capsule (25 mg total) by mouth every 8 (eight) hours as needed for anxiety or itching. 01/17/22   Shon Hale, MD  metoprolol succinate (TOPROL-XL) 25 MG 24 hr tablet Take 1 tablet (25 mg total) by mouth daily. 01/17/22   Shon Hale, MD  potassium chloride (KLOR-CON) 10 MEQ tablet Take 1 tablet (10 mEq total) by mouth daily. Take While taking Lasix/furosemide 01/17/22   Shon Hale, MD  sertraline (ZOLOFT) 50 MG tablet Take 1 tablet (50 mg total) by mouth daily. 01/17/22 01/17/23  Shon Hale, MD  spironolactone (ALDACTONE) 25 MG tablet Take 1 tablet (25 mg total) by mouth in the morning. 01/17/22   Shon Hale, MD  traZODone (DESYREL) 50 MG tablet Take 1 tablet (50 mg total) by mouth at bedtime. For sleep and mood 01/17/22   Shon Hale, MD  vitamin B-12 (CYANOCOBALAMIN) 1000 MCG tablet Take 1,000 mcg by mouth daily.    [provider]  warfarin (COUMADIN) 5 MG tablet Take 1 tablet (5 mg total) by mouth every evening. TAKE 1 TABLET BY MOUTH ONCE DAILY 01/17/22   Shon Hale, MD   Physical Exam: Vitals:   08/18/22 2052 08/18/22 2055 08/18/22 2059  BP:  (!) 128/93   Pulse:  (!) 116   Resp:  (!) 25   Temp:   98.5 F (36.9 C)  TempSrc:   Oral  SpO2:  96%   Weight: 102.6 kg    Height: 5\' 4"  (1.626 m)      General exam: Moderately built and nourished patient, Sitting comfortably upright on the gurney in no obvious distress. Head, eyes and ENT: Nontraumatic and normocephalic. Pupils equally reacting to light and accommodation. Oral mucosa moist. Neck: Supple. No JVD, carotid bruit or thyromegaly. Lymphatics: No lymphadenopathy. Respiratory system: Clear to auscultation. No increased work of breathing. Cardiovascular system: S1 and S2 heard, RRR. No JVD, murmurs, gallops. Click consistent with mechanical valve Gastrointestinal system: Abdomen is nondistended, soft and nontender. Central nervous system: Alert and oriented. No focal neurological  deficits. Extremities: No gross deformities Skin: No rashes or acute findings. Musculoskeletal system: Lower lumbar area on L side tender to palpation. Psychiatry: Pleasant and cooperative.  Labs on Admission:  Basic Metabolic Panel: Recent Labs  Lab 08/18/22 2125  NA 137  K 3.7  CL 109  CO2 22  GLUCOSE 88  BUN 12  CREATININE 0.75  CALCIUM 8.2*   Liver Function Tests: Recent Labs  Lab 08/18/22 2125  AST 25  ALT 23  ALKPHOS 65  BILITOT 0.7  PROT 7.0  ALBUMIN 3.4*  No results for input(s): "LIPASE", "AMYLASE" in the last 168 hours. No results for input(s): "AMMONIA" in the last 168 hours. CBC: Recent Labs  Lab 08/18/22 2125  WBC 7.4  NEUTROABS 5.0  HGB 11.0*  HCT 35.2*  MCV 81.3  PLT 322   Cardiac Enzymes: No results for input(s): "CKTOTAL", "CKMB", "CKMBINDEX", "TROPONINI" in the last 168 hours.  BNP (last 3 results) No results for input(s): "PROBNP" in the last 8760 hours. CBG: No results for input(s): "GLUCAP" in the last 168 hours.  Radiological Exams on Admission: DG Chest 2 View  Result Date: 08/18/2022 CLINICAL DATA:  Shortness of breath, history of CHF. EXAM: CHEST - 2 VIEW COMPARISON:  Chest radiograph dated Jan 14, 2022 FINDINGS: The heart is enlarged. Sternotomy wires and prior carotid valve replacement. Bilateral perihilar opacities suggesting pulmonary edema. Small bilateral pleural effusions. No acute osseous abnormality. IMPRESSION: Cardiomegaly with pulmonary edema and small bilateral pleural effusions. Electronically Signed   By: Larose HiresImran  Ahmed D.O.   On: 08/18/2022 21:44     Assessment/Plan Acute hypoxic respiratory failure HFrEF Exacerbation in the setting of mechanical heart valve -Lasix given for fluid removal -Troponin 29, follow if increased chest pain -BNP 341 -CXR-Cardiomegaly with pulmonary edema and small bilateral pleural effusions. -Heparin to warfarin bridge -On potassium supplements at home and on Lasix, trend electrolytes  with CMP -Last Echo 01/14/22, repeat Echo to evaluate for changes -Strict I/O -fluid restriction 1500ml -Daily CBC  Chronic Anemia -Continue home Folvite, B12 -Hgb 11.0 on admission -Monitor with CBC daily  Anxiety/depression -Continue home Buspar, vistaril, zoloft, and trazodone  Cocaine Positive on Tox screen -Hold metoprolol and other beta blockers -ECG showed sinus tachycardia. -Pt denies recent drug use -Counsel on stopping all drug use.    Hypocalcemia -Ca 8.2, albumin 3.4 corrects Ca to 8.7, mild decrease -Mg in AM, may be due to magnesium abnormality -Monitor with CMP  Obesity -BMI 38.83 -Counsel on diet and lifestyle modifications.  DVT Prophylaxis: Heparin Code Status: Full  Family Communication: none at bedside  Disposition Plan: Home pending resolution of symptoms   Time spent: 50min  Jake SharkJoseph D Hagan Vanauken, OMS IV [facility] How to contact the [facility] Attending or Consulting provider 7A - 7P or covering provider during after hours 7P -7A, for this patient?  Check the care team in [facility] and look for a) attending/consulting TR[facility] provider listed and b) the [facility] team listed Log into [email or web address] and use [facility] universal password to access. If you do not have the password, please contact the hospital operator. Locate the [facility] provider you are looking for under [facility] and page to a number that you can be directly reached. If you still have difficulty reaching the provider, please page the [facility] (Director on Call) for the Hospitalists listed on amion for assistance.

## 2022-08-18 NOTE — ED Triage Notes (Signed)
Pt BIB [facility] from home with multiple complaints. Pt c/o abd swelling, cough, fever, lower back pain, and some shortness of breath. Pt in no obvious distress.

## 2022-08-19 ENCOUNTER — Other Ambulatory Visit: Payer: Self-pay

## 2022-08-19 ENCOUNTER — Inpatient Hospital Stay (HOSPITAL_COMMUNITY): Payer: 59

## 2022-08-19 DIAGNOSIS — F418 Other specified anxiety disorders: Secondary | ICD-10-CM | POA: Diagnosis not present

## 2022-08-19 DIAGNOSIS — I5033 Acute on chronic diastolic (congestive) heart failure: Secondary | ICD-10-CM

## 2022-08-19 DIAGNOSIS — I5043 Acute on chronic combined systolic (congestive) and diastolic (congestive) heart failure: Secondary | ICD-10-CM

## 2022-08-19 DIAGNOSIS — Z720 Tobacco use: Secondary | ICD-10-CM

## 2022-08-19 DIAGNOSIS — Z952 Presence of prosthetic heart valve: Secondary | ICD-10-CM

## 2022-08-19 DIAGNOSIS — F1910 Other psychoactive substance abuse, uncomplicated: Secondary | ICD-10-CM

## 2022-08-19 DIAGNOSIS — I10 Essential (primary) hypertension: Secondary | ICD-10-CM | POA: Diagnosis not present

## 2022-08-19 LAB — TROPONIN I (HIGH SENSITIVITY): Troponin I (High Sensitivity): 33 ng/L — ABNORMAL HIGH (ref <18)

## 2022-08-19 LAB — CBC WITH DIFFERENTIAL/PLATELET
Abs Immature Granulocytes: 0.03 10*3/uL (ref 0.00–0.07)
Basophils Absolute: 0.1 10*3/uL (ref 0.0–0.1)
Basophils Relative: 1 %
Eosinophils Absolute: 0.2 10*3/uL (ref 0.0–0.5)
Eosinophils Relative: 2 %
HCT: 34.4 % — ABNORMAL LOW (ref 36.0–46.0)
Hemoglobin: 10.9 g/dL — ABNORMAL LOW (ref 12.0–15.0)
Immature Granulocytes: 0 %
Lymphocytes Relative: 31 %
Lymphs Abs: 2.4 10*3/uL (ref 0.7–4.0)
MCH: 25.8 pg — ABNORMAL LOW (ref 26.0–34.0)
MCHC: 31.7 g/dL (ref 30.0–36.0)
MCV: 81.5 fL (ref 80.0–100.0)
Monocytes Absolute: 0.5 10*3/uL (ref 0.1–1.0)
Monocytes Relative: 6 %
Neutro Abs: 4.7 10*3/uL (ref 1.7–7.7)
Neutrophils Relative %: 60 %
Platelets: 334 10*3/uL (ref 150–400)
RBC: 4.22 MIL/uL (ref 3.87–5.11)
RDW: 18 % — ABNORMAL HIGH (ref 11.5–15.5)
WBC: 7.8 10*3/uL (ref 4.0–10.5)
nRBC: 0 % (ref 0.0–0.2)

## 2022-08-19 LAB — ECHOCARDIOGRAM COMPLETE
AR max vel: 2.72 cm2
AV Area VTI: 2.75 cm2
AV Area mean vel: 2.71 cm2
AV Mean grad: 4 mmHg
AV Peak grad: 7.3 mmHg
Ao pk vel: 1.35 m/s
Area-P 1/2: 5.02 cm2
Calc EF: 44.7 %
Height: 64 in
MV VTI: 2.81 cm2
S' Lateral: 3.9 cm
Single Plane A2C EF: 49 %
Single Plane A4C EF: 45 %
Weight: 3619.07 oz

## 2022-08-19 LAB — COMPREHENSIVE METABOLIC PANEL
ALT: 21 U/L (ref 0–44)
AST: 23 U/L (ref 15–41)
Albumin: 3.3 g/dL — ABNORMAL LOW (ref 3.5–5.0)
Alkaline Phosphatase: 64 U/L (ref 38–126)
Anion gap: 6 (ref 5–15)
BUN: 12 mg/dL (ref 6–20)
CO2: 26 mmol/L (ref 22–32)
Calcium: 8.3 mg/dL — ABNORMAL LOW (ref 8.9–10.3)
Chloride: 106 mmol/L (ref 98–111)
Creatinine, Ser: 0.75 mg/dL (ref 0.44–1.00)
GFR, Estimated: >60 mL/min (ref >60)
Glucose, Bld: 98 mg/dL (ref 70–99)
Potassium: 3.3 mmol/L — ABNORMAL LOW (ref 3.5–5.1)
Sodium: 138 mmol/L (ref 135–145)
Total Bilirubin: 0.6 mg/dL (ref 0.3–1.2)
Total Protein: 6.7 g/dL (ref 6.5–8.1)

## 2022-08-19 LAB — HEPARIN LEVEL (UNFRACTIONATED)
Heparin Unfractionated: <0.1 IU/mL — ABNORMAL LOW (ref 0.30–0.70)
Heparin Unfractionated: 0.27 IU/mL — ABNORMAL LOW (ref 0.30–0.70)
Heparin Unfractionated: <0.1 IU/mL — ABNORMAL LOW (ref 0.30–0.70)

## 2022-08-19 LAB — PROTIME-INR
INR: 1.3 — ABNORMAL HIGH (ref 0.8–1.2)
Prothrombin Time: 15.7 seconds — ABNORMAL HIGH (ref 11.4–15.2)

## 2022-08-19 LAB — HEMOGLOBIN AND HEMATOCRIT, BLOOD
HCT: 36.2 % (ref 36.0–46.0)
Hemoglobin: 11.4 g/dL — ABNORMAL LOW (ref 12.0–15.0)

## 2022-08-19 LAB — MAGNESIUM: Magnesium: 2.1 mg/dL (ref 1.7–2.4)

## 2022-08-19 MED ORDER — HYDROXYZINE PAMOATE 25 MG PO CAPS: 25.0000 mg | ORAL_CAPSULE | Freq: Three times a day (TID) | ORAL | Status: DC | PRN

## 2022-08-19 MED ORDER — WARFARIN SODIUM 5 MG PO TABS: 5.0000 mg | ORAL_TABLET | Freq: Once | ORAL | Status: DC

## 2022-08-19 MED ORDER — WARFARIN SODIUM 7.5 MG PO TABS
7.5000 mg | ORAL_TABLET | Freq: Once | ORAL | Status: AC
  Administered 2022-08-19: 7.5 mg via ORAL
  Filled 2022-08-19: qty 1

## 2022-08-19 MED ORDER — HYDRALAZINE HCL 20 MG/ML IJ SOLN: 10.0000 mg | Freq: Four times a day (QID) | INTRAMUSCULAR | Status: DC | PRN

## 2022-08-19 MED ORDER — TRAZODONE HCL 50 MG PO TABS
50.0000 mg | ORAL_TABLET | Freq: Every day | ORAL | Status: DC
  Administered 2022-08-19 – 2022-08-23 (×5): 50 mg via ORAL
  Filled 2022-08-19 (×5): qty 1

## 2022-08-19 MED ORDER — MORPHINE SULFATE (PF) 2 MG/ML IV SOLN: 2.0000 mg | INTRAVENOUS | Status: DC | PRN

## 2022-08-19 MED ORDER — LORAZEPAM 1 MG PO TABS
1.0000 mg | ORAL_TABLET | Freq: Once | ORAL | Status: AC
  Administered 2022-08-19: 1 mg via ORAL
  Filled 2022-08-19: qty 1

## 2022-08-19 MED ORDER — ONDANSETRON HCL 4 MG/2ML IJ SOLN: 4.0000 mg | Freq: Four times a day (QID) | INTRAMUSCULAR | Status: DC | PRN

## 2022-08-19 MED ORDER — PNEUMOCOCCAL 20-VAL CONJ VACC 0.5 ML IM SUSY
0.5000 mL | PREFILLED_SYRINGE | INTRAMUSCULAR | Status: AC
  Administered 2022-08-20: 0.5 mL via INTRAMUSCULAR
  Filled 2022-08-19: qty 0.5

## 2022-08-19 MED ORDER — SPIRONOLACTONE 25 MG PO TABS
25.0000 mg | ORAL_TABLET | Freq: Every morning | ORAL | Status: DC
  Administered 2022-08-19 – 2022-08-24 (×6): 25 mg via ORAL
  Filled 2022-08-19 (×6): qty 1

## 2022-08-19 MED ORDER — WARFARIN SODIUM 5 MG PO TABS: 5.0000 mg | ORAL_TABLET | Freq: Every evening | ORAL | Status: DC

## 2022-08-19 MED ORDER — NICOTINE 21 MG/24HR TD PT24
21.0000 mg | MEDICATED_PATCH | Freq: Every day | TRANSDERMAL | Status: DC
  Administered 2022-08-19 – 2022-08-24 (×6): 21 mg via TRANSDERMAL
  Filled 2022-08-19 (×6): qty 1

## 2022-08-19 MED ORDER — ACETAMINOPHEN 325 MG PO TABS: 650.0000 mg | ORAL_TABLET | Freq: Four times a day (QID) | ORAL | Status: DC | PRN

## 2022-08-19 MED ORDER — HEPARIN BOLUS VIA INFUSION
3000.0000 [IU] | Freq: Once | INTRAVENOUS | Status: AC
  Administered 2022-08-19: 3000 [IU] via INTRAVENOUS

## 2022-08-19 MED ORDER — HEPARIN BOLUS VIA INFUSION
1000.0000 [IU] | Freq: Once | INTRAVENOUS | Status: AC
  Administered 2022-08-19: 1000 [IU] via INTRAVENOUS
  Filled 2022-08-19: qty 1000

## 2022-08-19 MED ORDER — HYDROXYZINE HCL 25 MG PO TABS
25.0000 mg | ORAL_TABLET | Freq: Three times a day (TID) | ORAL | Status: DC | PRN
  Administered 2022-08-19 – 2022-08-24 (×12): 25 mg via ORAL
  Filled 2022-08-19 (×13): qty 1

## 2022-08-19 MED ORDER — ONDANSETRON HCL 4 MG PO TABS: 4.0000 mg | ORAL_TABLET | Freq: Four times a day (QID) | ORAL | Status: DC | PRN

## 2022-08-19 MED ORDER — BUSPIRONE HCL 5 MG PO TABS
7.5000 mg | ORAL_TABLET | Freq: Three times a day (TID) | ORAL | Status: DC
  Administered 2022-08-19 – 2022-08-24 (×17): 7.5 mg via ORAL
  Filled 2022-08-19 (×17): qty 2

## 2022-08-19 MED ORDER — POTASSIUM CHLORIDE CRYS ER 20 MEQ PO TBCR
40.0000 meq | EXTENDED_RELEASE_TABLET | Freq: Once | ORAL | Status: AC
  Administered 2022-08-19: 40 meq via ORAL
  Filled 2022-08-19: qty 2

## 2022-08-19 MED ORDER — MAGNESIUM SULFATE IN D5W 1-5 GM/100ML-% IV SOLN
1.0000 g | Freq: Once | INTRAVENOUS | Status: AC
  Administered 2022-08-19: 1 g via INTRAVENOUS
  Filled 2022-08-19: qty 100

## 2022-08-19 MED ORDER — VITAMIN B-12 1000 MCG PO TABS
1000.0000 ug | ORAL_TABLET | Freq: Every day | ORAL | Status: DC
  Administered 2022-08-19 – 2022-08-24 (×6): 1000 ug via ORAL
  Filled 2022-08-19 (×6): qty 1

## 2022-08-19 MED ORDER — FUROSEMIDE 10 MG/ML IJ SOLN
40.0000 mg | Freq: Two times a day (BID) | INTRAMUSCULAR | Status: DC
  Administered 2022-08-19 – 2022-08-22 (×8): 40 mg via INTRAVENOUS
  Filled 2022-08-19 (×8): qty 4

## 2022-08-19 MED ORDER — MORPHINE SULFATE (PF) 2 MG/ML IV SOLN
2.0000 mg | INTRAVENOUS | Status: DC | PRN
  Administered 2022-08-23 – 2022-08-24 (×4): 2 mg via INTRAVENOUS
  Filled 2022-08-19 (×4): qty 1

## 2022-08-19 MED ORDER — METOPROLOL SUCCINATE ER 25 MG PO TB24: 25.0000 mg | ORAL_TABLET | Freq: Every day | ORAL | Status: DC

## 2022-08-19 MED ORDER — OXYCODONE HCL 5 MG PO TABS
5.0000 mg | ORAL_TABLET | ORAL | Status: DC | PRN
  Administered 2022-08-22 – 2022-08-24 (×3): 5 mg via ORAL
  Filled 2022-08-19 (×4): qty 1

## 2022-08-19 MED ORDER — FOLIC ACID 1 MG PO TABS
1.0000 mg | ORAL_TABLET | Freq: Every day | ORAL | Status: DC
  Administered 2022-08-19 – 2022-08-24 (×6): 1 mg via ORAL
  Filled 2022-08-19 (×6): qty 1

## 2022-08-19 MED ORDER — LORAZEPAM 2 MG/ML IJ SOLN: 1.0000 mg | Freq: Once | INTRAMUSCULAR | Status: DC

## 2022-08-19 MED ORDER — ACETAMINOPHEN 650 MG RE SUPP: 650.0000 mg | Freq: Four times a day (QID) | RECTAL | Status: DC | PRN

## 2022-08-19 MED ORDER — CARVEDILOL 3.125 MG PO TABS
6.2500 mg | ORAL_TABLET | Freq: Two times a day (BID) | ORAL | Status: DC
  Administered 2022-08-19 (×2): 6.25 mg via ORAL
  Filled 2022-08-19 (×3): qty 2

## 2022-08-19 MED ORDER — SERTRALINE HCL 50 MG PO TABS
50.0000 mg | ORAL_TABLET | Freq: Every day | ORAL | Status: DC
  Administered 2022-08-19 – 2022-08-24 (×6): 50 mg via ORAL
  Filled 2022-08-19 (×6): qty 1

## 2022-08-19 NOTE — TOC Initial Note (Signed)
Transition of Care [facility]) - Initial/Assessment Note    Patient Details  Name: Theresa Crawford MRN: [id number] Date of Birth: 1981/05/14  [facility]) CM/SW Contact:    Annice Needy, LCSW Phone Number: 08/19/2022, 4:56 PM  Clinical Narrative:                 Patient admitted for acute HF. TOC consulted for HF home screen and SA resources. Patient does not follow HH diet. Patient accepting of SA resources. Patient states that he is "possibly" interested in treatment. She has not been in SA treatment before.  Review of chart shows that patient was referred to [facility] in May.   Expected Discharge Plan: Home/Self Care Barriers to Discharge: Continued Medical Work up   Patient Goals and CMS Choice Patient states their goals for this hospitalization and ongoing recovery are:: discharge when well      Expected Discharge Plan and Services Expected Discharge Plan: Home/Self Care       Living arrangements for the past 2 months: Single Family Home                                      Prior Living Arrangements/Services Living arrangements for the past 2 months: Single Family Home   Patient language and need for interpreter reviewed:: Yes Do you feel safe going back to the place where you live?: Yes      Need for Family Participation in Patient Care: Yes (Comment) Care giver support system in place?: Yes (comment)   Criminal Activity/Legal Involvement Pertinent to Current Situation/Hospitalization: No - Comment as needed  Activities of Daily Living Home Assistive Devices/Equipment: None ADL Screening (condition at time of admission) Patient's cognitive ability adequate to safely complete daily activities?: Yes Is the patient deaf or have difficulty hearing?: No Does the patient have difficulty seeing, even when wearing glasses/contacts?: No Does the patient have difficulty concentrating, remembering, or making decisions?: No Patient able to express  need for assistance with ADLs?: Yes Does the patient have difficulty dressing or bathing?: No Independently performs ADLs?: Yes (appropriate for developmental age) Does the patient have difficulty walking or climbing stairs?: No Weakness of Legs: None Weakness of Arms/Hands: None  Permission Sought/Granted                  Emotional Assessment     Affect (typically observed): Appropriate Orientation: : Oriented to Self, Oriented to Place, Oriented to  Time, Oriented to Situation Alcohol / Substance Use: Not Applicable Psych Involvement: No (comment)  Admission diagnosis:  CHF exacerbation (HCC) [I50.9] Acute on chronic congestive heart failure, unspecified heart failure type (HCC) [I50.9] Patient Active Problem List   Diagnosis Date Noted   Substance abuse (HCC) 08/19/2022   Encounter for therapeutic drug monitoring 01/21/2022   Atrial flutter with rapid ventricular response (HCC)    Acute HFrEF 01/12/2022   Subtherapeutic international normalized ratio (INR) 01/12/2022   History of heart valve replacement with mechanical valve 04/05/2019   S/P tricuspid valve repair 04/05/2019   Obesity (BMI 30-39.9) 03/29/2019   Sarcoidosis    Acute on chronic diastolic congestive heart failure (HCC) 03/24/2019   Normocytic anemia    Dysuria 03/19/2019   Acute CHF (congestive heart failure) (HCC) 03/04/2019   MDD (major depressive disorder), recurrent episode (HCC) 04/05/2018   S/P tooth extraction 04/03/2018   Suicidal ideation 04/03/2018   MDD (major depressive disorder)  Chronic diastolic congestive heart failure (HCC)    Tricuspid regurgitation    Hypomagnesemia 03/07/2018   Tobacco abuse    Dyspnea 01/11/2018   Prolonged QT interval 09/10/2017   Normochromic normocytic anemia    Pulmonary edema 07/05/2017   Hypokalemia 06/12/2017   Pulmonary hypertension (HCC) 06/12/2017   Flash pulmonary edema (HCC) 06/11/2017   Depression with anxiety 06/11/2017   Recurrent mitral  valve stenosis and regurgitation s/p mitral valve repair    Acute pulmonary edema (HCC) 10/10/2016   CAP (community acquired pneumonia) 05/25/2016   Leukocytosis 05/24/2016   Acute respiratory failure with hypoxia (HCC) 05/23/2016   Acute on chronic diastolic CHF (congestive heart failure) (HCC) 05/23/2016   Cough with hemoptysis 05/23/2016   Postpartum complication pericarditis in 2009 with eventual needing MVR in 2015 05/23/2016   Anemia, iron deficiency 05/23/2016   Hypertension    SOB (shortness of breath)    Respiratory distress 02/16/2016   Essential hypertension 02/16/2016   S/P mitral valve repair 03/27/2013   PCP:  Patient, No Pcp Per Pharmacy:   [facility] [address], [place] - [address] [place] [address] Phone: [phone] Fax: [phone]  [facility] [address] [place] - [address] [place] [address] Phone: [phone] Fax: [phone]     Social Determinants of Health (SDOH) Interventions    Readmission Risk Interventions    01/13/2022    4:12 PM  Readmission Risk Prevention Plan  Transportation Screening Complete  PCP or Specialist Appt within 5-7 Days Complete  Home Care Screening Complete  Medication Review (RN CM) Complete

## 2022-08-19 NOTE — Progress Notes (Signed)
ANTICOAGULATION CONSULT NOTE - Follow Up Consult  Pharmacy Consult for heparin Indication:  MVR  Labs: Recent Labs    08/18/22 2125 08/18/22 2336 08/19/22 0447  HGB 11.0*  --  10.9*  HCT 35.2*  --  34.4*  PLT 322  --  334  LABPROT 15.4*  --  15.7*  INR 1.2  --  1.3*  HEPARINUNFRC  --   --  <0.10*  CREATININE 0.75  --  0.75  TROPONINIHS 29* 33*  --     Assessment: 41yo female subtherapeutic on heparin with initial dosing for MVR; no infusion issues or signs of bleeding per RN.  Goal of Therapy:  Heparin level 0.3-0.7 units/ml   Plan:  Will rebolus with heparin 3000 units and increase heparin infusion by 4 units/kg/hr to 1500 units/hr and check level in 6 hours.    Vernard Gambles, PharmD, BCPS  08/19/2022,6:54 AM

## 2022-08-19 NOTE — Progress Notes (Signed)
ANTICOAGULATION CONSULT NOTE -   Pharmacy Consult for heparin/warfarin Indication:  mechanical heart valve  Allergies  Allergen Reactions   Bee Venom Anaphylaxis   Lisinopril Anaphylaxis, Shortness Of Breath, Swelling and Other (See Comments)    Throat swells   Penicillins Shortness Of Breath, Swelling and Other (See Comments)    Has patient had a PCN reaction causing immediate rash, facial/tongue/throat swelling, SOB or lightheadedness with hypotension: Yes Has patient had a PCN reaction causing severe rash involving mucus membranes or skin necrosis: Yes Has patient had a PCN reaction that required hospitalization No Has patient had a PCN reaction occurring within the last 10 years: No If all of the above answers are "NO", then may proceed with Cephalosporin use.    Perflutren Lipid Microsphere Shortness Of Breath and Other (See Comments)    Chest Pain/Tightness, also   Shellfish Allergy Anaphylaxis   Contrast Media [Iodinated Contrast Media] Hives, Itching and Other (See Comments)    Itching and hives to throat, neck, face and arms.   No difficulty breathing.   This was given at [facility] approximately 2015. Contrast Dye    Patient Measurements: Height: 5\' 4"  (162.6 cm) Weight: 102.6 kg (226 lb 3.1 oz) IBW/kg (Calculated) : 54.7 Heparin Dosing Weight: 78  Vital Signs: Temp: 98.6 F (37 C) (12/07 1230) Temp Source: Oral (12/07 1230) BP: 142/90 (12/07 1230) Pulse Rate: 94 (12/07 1230)  Labs: Recent Labs    08/18/22 2125 08/18/22 2336 08/19/22 0447 08/19/22 1357  HGB 11.0*  --  10.9*  --   HCT 35.2*  --  34.4*  --   PLT 322  --  334  --   LABPROT 15.4*  --  15.7*  --   INR 1.2  --  1.3*  --   HEPARINUNFRC  --   --  <0.10* 0.27*  CREATININE 0.75  --  0.75  --   TROPONINIHS 29* 33*  --   --      Estimated Creatinine Clearance: 106.9 mL/min (by C-G formula based on SCr of 0.75 mg/dL).   Medical History: Past Medical History:  Diagnosis Date    Anxiety    Arthritis    "lower back" (04/04/2018)   Bipolar disorder (HCC)    Chronic bronchitis (HCC)    Chronic diastolic CHF (congestive heart failure) (HCC)    Chronic lower back pain    DDD (degenerative disc disease), lumbar    Depression    Dyspnea    GERD (gastroesophageal reflux disease)    Headache    "weekly" (04/04/2018), miragrains in past (04/03/2019)   Heart murmur    Hypertension    Mitral valve disease    Normocytic anemia    Obesity    Palpitations    Pericarditis    Pneumonia    Premature atrial contractions    Pulmonary hypertension (HCC)    PVC's (premature ventricular contractions)    a. h/o palpitations with event monitor in 03/2017 showing NSR with PACs/PVCs.   Recurrent mitral valve stenosis and regurgitation s/p mitral valve repair    S/P mitral valve repair 03/27/2013   Dr. 03/29/2013 - Darcus Austin, [place] - complex valvuloplasty including resuspension of entire posterior leaflet using Gore-tex neochords and 30 mm Edwards Physio ring annuloplasty   S/P redo mitral valve replacement with mechanical valve 04/05/2019   29 mm Sorin Carbomedics Optiform bileaflet mechanical valve   S/P tricuspid valve repair 04/05/2019   28 mm Edwards mc3 ring annuloplasty   Tobacco abuse  Tricuspid regurgitation      Assessment: 41 year old female with history of mitral/tricuspid valve repair currently with mechanical mitral valve supposed to be on warfarin prior to admit but not currently due to issues with cost. Orders to start heparin bridge to warfarin.    Last outpatient INR visit was 06/15/21, but records do show warfarin dispensed 07/23/22.   INR 1.3 Heparin level 0.27- slightly subtherapeutic  Goal of Therapy:  Heparin level 0.3-0.7 units/ml INR 2.5-3.5 Monitor platelets by anticoagulation protocol: Yes   Plan:  Warfarin 7.5 mg x 1 dose.  Rebolus 1000 units x 1 Increase heparin infusion to 1650 units/hr. Check anti-Xa level in 6 hours and  daily. Continue to monitor H&H and platelets.   Judeth Cornfield, PharmD Clinical Pharmacist 08/19/2022 3:00 PM

## 2022-08-19 NOTE — Progress Notes (Signed)
ANTICOAGULATION CONSULT NOTE -   Pharmacy Consult for heparin/warfarin Indication:  mechanical heart valve  Allergies  Allergen Reactions   Bee Venom Anaphylaxis   Lisinopril Anaphylaxis, Shortness Of Breath, Swelling and Other (See Comments)    Throat swells   Penicillins Shortness Of Breath, Swelling and Other (See Comments)    Has patient had a PCN reaction causing immediate rash, facial/tongue/throat swelling, SOB or lightheadedness with hypotension: Yes Has patient had a PCN reaction causing severe rash involving mucus membranes or skin necrosis: Yes Has patient had a PCN reaction that required hospitalization No Has patient had a PCN reaction occurring within the last 10 years: No If all of the above answers are "NO", then may proceed with Cephalosporin use.    Perflutren Lipid Microsphere Shortness Of Breath and Other (See Comments)    Chest Pain/Tightness, also   Shellfish Allergy Anaphylaxis   Contrast Media [Iodinated Contrast Media] Hives, Itching and Other (See Comments)    Itching and hives to throat, neck, face and arms.   No difficulty breathing.   This was given at [facility] approximately 2015. Contrast Dye    Patient Measurements: Height: 5\' 4"  (162.6 cm) Weight: 102.6 kg (226 lb 3.1 oz) IBW/kg (Calculated) : 54.7 Heparin Dosing Weight: 78  Vital Signs: Temp: 98.6 F (37 C) (12/07 2156) Temp Source: Oral (12/07 2156) BP: 140/80 (12/07 2156) Pulse Rate: 122 (12/07 2156)  Labs: Recent Labs    08/18/22 2125 08/18/22 2336 08/19/22 0447 08/19/22 1357 08/19/22 2146  HGB 11.0*  --  10.9*  --  11.4*  HCT 35.2*  --  34.4*  --  36.2  PLT 322  --  334  --   --   LABPROT 15.4*  --  15.7*  --   --   INR 1.2  --  1.3*  --   --   HEPARINUNFRC  --   --  <0.10* 0.27* <0.10*  CREATININE 0.75  --  0.75  --   --   TROPONINIHS 29* 33*  --   --   --      Estimated Creatinine Clearance: 106.9 mL/min (by C-G formula based on SCr of 0.75  mg/dL).   Medical History: Past Medical History:  Diagnosis Date   Anxiety    Arthritis    "lower back" (04/04/2018)   Bipolar disorder (HCC)    Chronic bronchitis (HCC)    Chronic diastolic CHF (congestive heart failure) (HCC)    Chronic lower back pain    DDD (degenerative disc disease), lumbar    Depression    Dyspnea    GERD (gastroesophageal reflux disease)    Headache    "weekly" (04/04/2018), miragrains in past (04/03/2019)   Heart murmur    Hypertension    Mitral valve disease    Normocytic anemia    Obesity    Palpitations    Pericarditis    Pneumonia    Premature atrial contractions    Pulmonary hypertension (HCC)    PVC's (premature ventricular contractions)    a. h/o palpitations with event monitor in 03/2017 showing NSR with PACs/PVCs.   Recurrent mitral valve stenosis and regurgitation s/p mitral valve repair    S/P mitral valve repair 03/27/2013   Dr. 03/29/2013 - Darcus Austin, [place] - complex valvuloplasty including resuspension of entire posterior leaflet using Gore-tex neochords and 30 mm Edwards Physio ring annuloplasty   S/P redo mitral valve replacement with mechanical valve 04/05/2019   29 mm Sorin Carbomedics Optiform bileaflet mechanical valve  S/P tricuspid valve repair 04/05/2019   28 mm Edwards mc3 ring annuloplasty   Tobacco abuse    Tricuspid regurgitation      Assessment: 41 year old female with history of mitral/tricuspid valve repair currently with mechanical mitral valve supposed to be on warfarin prior to admit but not currently due to issues with cost. Orders to start heparin bridge to warfarin.    Last outpatient INR visit was 06/15/21, but records do show warfarin dispensed 07/23/22.   INR 1.3 Heparin found disconnected by night nurse, possibly has been off for over an hour. Patient also having bleeding around IV site.  Instructed nursing to restart at previous rate and will recheck heparin level with am labs.   Goal of Therapy:   Heparin level 0.3-0.7 units/ml INR 2.5-3.5 Monitor platelets by anticoagulation protocol: Yes   Plan:  Restart heparin at 1650/hr Check anti-Xa level in 6 hours and daily. Continue to monitor H&H and platelets.  Sheppard Coil PharmD., BCPS Clinical Pharmacist 08/19/2022 10:19 PM

## 2022-08-19 NOTE — Progress Notes (Signed)
   08/19/22 1612  Vitals  Temp 98.4 F (36.9 C)  Temp Source Oral  BP (!) 125/96  MAP (mmHg) 104  BP Location Left Arm  BP Method Automatic  Patient Position (if appropriate) Lying  Pulse Rate (!) 117  Pulse Rate Source Dinamap  Resp 19  MEWS COLOR  MEWS Score Color Yellow  Oxygen Therapy  SpO2 95 %  MEWS Score  MEWS Temp 0  MEWS Systolic 0  MEWS Pulse 2  MEWS RR 0  MEWS LOC 0  MEWS Score 2   Pt continues to have elevated HR with periods of anxiety. Pt expresses she does not like alot of different people or change. Pt YELLOW MEWS as 1612. No S&S, c/p or SOB at this time, pt resting in bed.

## 2022-08-19 NOTE — Assessment & Plan Note (Signed)
-  Non compliant with warfarin -Bridge to warfarin with heparin

## 2022-08-19 NOTE — H&P (Incomplete)
History and Physical  ADOLPH PLATANIA [id number] DOB: Feb 14, 1981 DOA: 08/18/2022  Referring physician: Paulita Cradle PA-C PCP: Patient, No Pcp Per   Chief Complaint: Shortness of Breath  HPI: Theresa Crawford is a 41 y.o. female with a history of CHF, mitral valve replacement, tricuspid repair, and depression/anxiety who presented to the ED on 08/18/22 for shortness of breath. She has not been compliant with her warfarin outpatient. Her symptoms started a few days ago. She was recently switched to Lasix from Bumex and she has felt worse since that change and believes it may not be working as well. She is unsure why she was switched. She has some back pain, but denies other symptoms. She is currently on 2L [place] which she is not on at home.   ED Course: BNP was 341, Troponin 29, hgb 11.0, PTT 15.4 with INR 1.2, Tox screen positive for cocaine. CXR showed Cardiomegaly with pulmonary edema and small bilateral pleural effusions. She was placed on 2L [place] and given lasix for diuresis. She was placed on heparin to bridge to warfarin.  Review of Systems: All systems reviewed and apart from history of presenting illness, are negative.  Past Medical History:  Diagnosis Date  . Anxiety   . Arthritis    "lower back" (04/04/2018)  . Bipolar disorder ([place])   . Chronic bronchitis ([place])   . Chronic diastolic CHF (congestive heart failure) ([place])   . Chronic lower back pain   . DDD (degenerative disc disease), lumbar   . Depression   . Dyspnea   . GERD (gastroesophageal reflux disease)   . Headache    "weekly" (04/04/2018), miragrains in past (04/03/2019)  . Heart murmur   . Hypertension   . Mitral valve disease   . Normocytic anemia   . Obesity   . Palpitations   . Pericarditis   . Pneumonia   . Premature atrial contractions   . Pulmonary hypertension ([place])   . PVC's (premature ventricular contractions)    a. h/o palpitations with event monitor in 03/2017 showing NSR with PACs/PVCs.  .  Recurrent mitral valve stenosis and regurgitation s/p mitral valve repair   . S/P mitral valve repair 03/27/2013   Dr. Fransico Michael - [place], [place] - complex valvuloplasty including resuspension of entire posterior leaflet using Gore-tex neochords and 30 mm Edwards Physio ring annuloplasty  . S/P redo mitral valve replacement with mechanical valve 04/05/2019   29 mm Sorin Carbomedics Optiform bileaflet mechanical valve  . S/P tricuspid valve repair 04/05/2019   28 mm Edwards mc3 ring annuloplasty  . Tobacco abuse   . Tricuspid regurgitation    Past Surgical History:  Procedure Laterality Date  . ABDOMINAL HERNIA REPAIR  ~ 2011  . CARDIAC CATHETERIZATION  2014  . CARDIOVERSION N/A 01/14/2022   Procedure: CARDIOVERSION;  Surgeon: Arnoldo Lenis, MD;  Location: [facility];  Service: Endoscopy;  Laterality: N/A;  . CESAREAN SECTION  2004; 2009  . HERNIA REPAIR    . MITRAL VALVE REPAIR  03/27/2013   Dr. Fransico Michael - [place], [place]. - complex valvuloplasty including resuspension of posterior leaflet with 30 mm CE Physio ring annuloplasty  . MITRAL VALVE REPLACEMENT N/A 04/05/2019   Procedure: Mitral Valve (Mv) Replacement using Carbomedics Optiform Valve size 62mm;  Surgeon: Rexene Alberts, MD;  Location: [facility];  Service: Open Heart Surgery;  Laterality: N/A;  . MULTIPLE EXTRACTIONS WITH ALVEOLOPLASTY N/A 04/03/2018   Procedure: Extraction of tooth #30 with alveoloplasty and gross debridement of  remaining teeth;  Surgeon: Charlynne Pander, DDS;  Location: [facility] OR;  Service: Oral Surgery;  Laterality: N/A;  . PARTIAL HYSTERECTOMY  2011   PID w/problem with fallopian tubes, had left fallopian and left ovary removed, pt still having periods  . RIGHT HEART CATH N/A 03/27/2019   Procedure: RIGHT HEART CATH;  Surgeon: Laurey Morale, MD;  Location: [facility] INVASIVE CV LAB;  Service: Cardiovascular;  Laterality: N/A;  . RIGHT/LEFT HEART CATH AND CORONARY ANGIOGRAPHY N/A 03/05/2019   Procedure:  RIGHT/LEFT HEART CATH AND CORONARY ANGIOGRAPHY;  Surgeon: Kathleene Hazel, MD;  Location: [facility] INVASIVE CV LAB;  Service: Cardiovascular;  Laterality: N/A;  . TEE WITHOUT CARDIOVERSION N/A 05/26/2016   Procedure: TRANSESOPHAGEAL ECHOCARDIOGRAM (TEE);  Surgeon: [name], MD;  Location: [facility] ENDO SU[facility];  Service: Cardiovascular;  Laterality: N/A;  . TEE WITHOUT CARDIOVERSION N/A 01/25/2018   Procedure: TRANSESOPHAGEAL ECHOCARDIOGRAM (TEE) WITH PROPOFOL;  Surgeon: Jonelle Sidle, MD;  Location: [facility];  Service: Cardiovascular;  Laterality: N/A;  . TEE WITHOUT CARDIOVERSION N/A 03/05/2019   Procedure: TRANSESOPHAGEAL ECHOCARDIOGRAM (TEE);  Surgeon: Pricilla Riffle, MD;  Location: [facility] ENDOSCOPY;  Service: Cardiovascular;  Laterality: N/A;  . TEE WITHOUT CARDIOVERSION N/A 04/05/2019   Procedure: TRANSESOPHAGEAL ECHOCARDIOGRAM (TEE);  Surgeon: Purcell Nails, MD;  Location: [facility] OR;  Service: Open Heart Surgery;  Laterality: N/A;  . TEE WITHOUT CARDIOVERSION N/A 01/14/2022   Procedure: TRANSESOPHAGEAL ECHOCARDIOGRAM (TEE);  Surgeon: Antoine Poche, MD;  Location: [facility];  Service: Endoscopy;  Laterality: N/A;  . TRICUSPID VALVE REPLACEMENT N/A 04/05/2019   Procedure: Tricuspid Valve Repair using Edwards MC3 Tricuspid ring size 69mm;  Surgeon: Purcell Nails, MD;  Location: [facility] OR;  Service: Open Heart Surgery;  Laterality: N/A;   Social History:  reports that she has been smoking cigarettes. She started smoking about 22 years ago. She has a 12.00 pack-year smoking history. She has never used smokeless tobacco. She reports current alcohol use. She reports current drug use. Drug: Marijuana.  Allergies  Allergen Reactions  . Bee Venom Anaphylaxis  . Lisinopril Anaphylaxis, Shortness Of Breath, Swelling and Other (See Comments)    Throat swells  . Penicillins Shortness Of Breath, Swelling and Other (See Comments)    Has patient had a PCN reaction causing immediate rash,  facial/tongue/throat swelling, SOB or lightheadedness with hypotension: Yes Has patient had a PCN reaction causing severe rash involving mucus membranes or skin necrosis: Yes Has patient had a PCN reaction that required hospitalization No Has patient had a PCN reaction occurring within the last 10 years: No If all of the above answers are "NO", then may proceed with Cephalosporin use.   Marland Kitchen Perflutren Lipid Microsphere Shortness Of Breath and Other (See Comments)    Chest Pain/Tightness, also  . Shellfish Allergy Anaphylaxis  . Contrast Media [Iodinated Contrast Media] Hives, Itching and Other (See Comments)    Itching and hives to throat, neck, face and arms.   No difficulty breathing.   This was given at [facility] approximately 2015. Contrast Dye    Family History  Problem Relation Age of Onset  . Heart failure Father        just received LVAD  . Heart disease Paternal Grandfather        stent placement    Prior to Admission medications   Medication Sig Start Date End Date Taking? Authorizing Provider  acetaminophen (TYLENOL) 325 MG tablet Take 2 tablets (650 mg total) by mouth every 6 (six)  hours as needed for mild pain. 04/13/19   Nani Skillern, PA-C  albuterol (VENTOLIN HFA) 108 (90 Base) MCG/ACT inhaler Inhale 2 puffs into the lungs every 6 (six) hours as needed for wheezing or shortness of breath. 01/17/22   Roxan Hockey, MD  busPIRone (BUSPAR) 7.5 MG tablet Take 1 tablet (7.5 mg total) by mouth 3 (three) times daily. 01/17/22   Roxan Hockey, MD  ferrous sulfate 325 (65 FE) MG tablet Take 1 tablet (325 mg total) by mouth daily with breakfast. 01/17/22   Roxan Hockey, MD  folic acid (FOLVITE) 1 MG tablet Take 1 tablet (1 mg total) by mouth daily. 01/17/22 01/17/23  Roxan Hockey, MD  furosemide (LASIX) 40 MG tablet Take 1 tablet (40 mg total) by mouth daily. 01/17/22 01/17/23  Roxan Hockey, MD  hydrOXYzine (VISTARIL) 25 MG capsule Take 1 capsule (25 mg  total) by mouth every 8 (eight) hours as needed for anxiety or itching. 01/17/22   Roxan Hockey, MD  metoprolol succinate (TOPROL-XL) 25 MG 24 hr tablet Take 1 tablet (25 mg total) by mouth daily. 01/17/22   Roxan Hockey, MD  potassium chloride (KLOR-CON) 10 MEQ tablet Take 1 tablet (10 mEq total) by mouth daily. Take While taking Lasix/furosemide 01/17/22   Roxan Hockey, MD  sertraline (ZOLOFT) 50 MG tablet Take 1 tablet (50 mg total) by mouth daily. 01/17/22 01/17/23  Roxan Hockey, MD  spironolactone (ALDACTONE) 25 MG tablet Take 1 tablet (25 mg total) by mouth in the morning. 01/17/22   Roxan Hockey, MD  traZODone (DESYREL) 50 MG tablet Take 1 tablet (50 mg total) by mouth at bedtime. For sleep and mood 01/17/22   Roxan Hockey, MD  vitamin B-12 (CYANOCOBALAMIN) 1000 MCG tablet Take 1,000 mcg by mouth daily.    [provider]  warfarin (COUMADIN) 5 MG tablet Take 1 tablet (5 mg total) by mouth every evening. TAKE 1 TABLET BY MOUTH ONCE DAILY 01/17/22   Roxan Hockey, MD   Physical Exam: Vitals:   08/18/22 2052 08/18/22 2055 08/18/22 2059  BP:  (!) 128/93   Pulse:  (!) 116   Resp:  (!) 25   Temp:   98.5 F (36.9 C)  TempSrc:   Oral  SpO2:  96%   Weight: 102.6 kg    Height: 5\' 4"  (1.626 m)      General exam: Moderately built and nourished patient, Sitting comfortably upright on the gurney in no obvious distress. Head, eyes and ENT: Nontraumatic and normocephalic. Pupils equally reacting to light and accommodation. Oral mucosa moist. Neck: Supple. No JVD, carotid bruit or thyromegaly. Lymphatics: No lymphadenopathy. Respiratory system: Clear to auscultation. No increased work of breathing. Cardiovascular system: S1 and S2 heard, RRR. No JVD, murmurs, gallops. Click consistent with mechanical valve Gastrointestinal system: Abdomen is nondistended, soft and nontender. Central nervous system: Alert and oriented. No focal neurological deficits. Extremities: No gross  deformities Skin: No rashes or acute findings. Musculoskeletal system: Negative exam. Psychiatry: Pleasant and cooperative.  Labs on Admission:  Basic Metabolic Panel: Recent Labs  Lab 08/18/22 2125  NA 137  K 3.7  CL 109  CO2 22  GLUCOSE 88  BUN 12  CREATININE 0.75  CALCIUM 8.2*   Liver Function Tests: Recent Labs  Lab 08/18/22 2125  AST 25  ALT 23  ALKPHOS 65  BILITOT 0.7  PROT 7.0  ALBUMIN 3.4*   No results for input(s): "LIPASE", "AMYLASE" in the last 168 hours. No results for input(s): "AMMONIA" in the last 168  hours. CBC: Recent Labs  Lab 08/18/22 2125  WBC 7.4  NEUTROABS 5.0  HGB 11.0*  HCT 35.2*  MCV 81.3  PLT 322   Cardiac Enzymes: No results for input(s): "CKTOTAL", "CKMB", "CKMBINDEX", "TROPONINI" in the last 168 hours.  BNP (last 3 results) No results for input(s): "PROBNP" in the last 8760 hours. CBG: No results for input(s): "GLUCAP" in the last 168 hours.  Radiological Exams on Admission: DG Chest 2 View  Result Date: 08/18/2022 CLINICAL DATA:  Shortness of breath, history of CHF. EXAM: CHEST - 2 VIEW COMPARISON:  Chest radiograph dated Jan 14, 2022 FINDINGS: The heart is enlarged. Sternotomy wires and prior carotid valve replacement. Bilateral perihilar opacities suggesting pulmonary edema. Small bilateral pleural effusions. No acute osseous abnormality. IMPRESSION: Cardiomegaly with pulmonary edema and small bilateral pleural effusions. Electronically Signed   By: Keane Police D.O.   On: 08/18/2022 21:44     Assessment/Plan Acute hypoxic respiratory failure HFrEF Exacerbation in the setting of mechanical heart valve -Lasix for fluid removal -Troponin 29, follow if increased chest pain -BNP 341 -CXR-Cardiomegaly with pulmonary edema and small bilateral pleural effusions. -Heparin to warfarin bridge -On potassium supplements at home and on Lasix, trend electrolytes with CMP -Echo  Anxiety/depression -Continue home Buspar, vistaril,  zoloft, and trazodone  DVT Prophylaxis: Heparin Code Status: Full  Family Communication: none at bedside  Disposition Plan: Home pending resolution of symptoms   Time spent: [place], OMS IV [facility] How to contact the [facility] Attending or Consulting provider [place] or covering provider during after hours [place], for this patient?  Check the care team in [facility] and look for a) attending/consulting [facility] provider listed and b) the [facility] team listed Log into [email or web address] and use [place] universal password to access. If you do not have the password, please contact the hospital operator. Locate the [facility] provider you are looking for under [facility] and page to a number that you can be directly reached. If you still have difficulty reaching the provider, please page the [facility] (Director on Call) for the Hospitalists listed on amion for assistance.

## 2022-08-19 NOTE — Assessment & Plan Note (Signed)
-  Continue spironalactone -hold metoprolol in the setting of cocaine use -PRN hydralazine

## 2022-08-19 NOTE — Assessment & Plan Note (Signed)
-  Continue nicotine patch -counseled on importance of cessation

## 2022-08-19 NOTE — Assessment & Plan Note (Signed)
-  Continue buspar, SSRI, vistaril, trazodone

## 2022-08-19 NOTE — Progress Notes (Incomplete)
*  PRELIMINARY RESULTS* Echocardiogram 2D Echocardiogram has been performed.  Theresa Crawford 08/19/2022, 10:23 AM

## 2022-08-19 NOTE — ED Notes (Signed)
Echo at bedside

## 2022-08-19 NOTE — ED Notes (Signed)
Pt given breakfast tray

## 2022-08-19 NOTE — Progress Notes (Signed)
Pt brought up from ED to unit 300 by w/c. Pt was tearful and labored breathing and states "I am anxious with people and change". Breathing techniques, quiet environment and reassurance provided. PRN Hydralazine administered, pt expressed relief. Pt ambulatory to the bathroom independently, refused SCD's.

## 2022-08-19 NOTE — Progress Notes (Signed)
Progress Note   Patient: Theresa Crawford EQA:[id number] DOB: 1981-02-22 DOA: 08/18/2022     1 DOS: the patient was seen and examined on 08/19/2022   Brief hospital course: As per H&P written by Dr.Zierle-Ghosh on 08/19/22 Theresa Crawford is a 41 y.o. female with a history of CHF, mitral valve replacement, tricuspid repair, chronic anemia, and depression/anxiety who presented to the ED on 08/18/22 for shortness of breath. She has not been compliant with her warfarin outpatient. Her symptoms started a few days ago. She was recently switched to Lasix from Bumex and she has felt worse since that change and believes it may not be working as well. She is unsure why she was switched. She has some back pain on the L in the lumbar region tender to palpation. Denies other symptoms. She is currently on 2L [place] which she is not on at home.    ED Course: BNP was 341, Troponin 29, hgb 11.0, PTT 15.4 with INR 1.2, Tox screen positive for cocaine. CXR showed Cardiomegaly with pulmonary edema and small bilateral pleural effusions. ECG showed sinus tachycardia. She was placed on 2L [place] and given lasix for diuresis. She was placed on heparin to bridge to warfarin.  Assessment and Plan: * Acute on chronic CHF exacerbation (based on previous echo results diastolic in nature)  -Update 2D echo -Follow daily weights, low-sodium diet and strict intake and output -Continue IV diuresis -Follow and replete electrolytes as needed -Cessation of recreational habits discussed with patient (underlying history of cocaine abuse). -Patient denies chest pain currently. -Continue to wean off oxygen supplementation as tolerated. - Medication compliance encouraged.  History of heart valve replacement with mechanical valve -Medication noncompliance appreciated; INR subtherapeutic -Continue bridging with heparin and Coumadin per pharmacy; goal is for INR 2.5-3.5  Tobacco abuse -Cessation counseling provided -Continue nicotine  patch.  Depression with anxiety -Stable mood overall -No suicidal ideation or hallucination -Continue buspar, SSRI, vistaril, trazodone  Essential hypertension -Continue spironolactone and IV Lasix -Continue as needed hydralazine and start treatment with carvedilol. -Heart healthy/low-sodium diet discussed with patient.  Class II obesity -Body mass index is 38.83 kg/m. -Diet, portion control and increase physical activity discussed with patient.  Subjective:  No fever, currently chest pain-free.  Reporting shortness of breath with activity, orthopnea and signs of increased fluid/volume in her body.  Physical Exam: Vitals:   08/18/22 2055 08/18/22 2059 08/19/22 0429 08/19/22 0743  BP: (!) 128/93  123/62 124/81  Pulse: (!) 116  (!) 116 (!) 116  Resp: (!) 25  19   Temp:  98.5 F (36.9 C) 97.8 F (36.6 C) 98 F (36.7 C)  TempSrc:  Oral Oral   SpO2: 96%  98% 98%  Weight:      Height:       General exam: Alert, awake, oriented x 3; expressing shortness of breath with minimal exertion and complaining of orthopnea.  No nausea or vomiting.  Denies chest pain currently.  Patient is afebrile Respiratory system: 2 L nasal cannula supplementation in place; tachypnea with activity appreciated.  No using accessory muscle.  Positive crackles at the bases appreciated on exam.  No wheezing. Cardiovascular system:RRR. No rubs or gallops; no JVD appreciated due to body habitus. Gastrointestinal system: Abdomen is obese, nondistended, soft and nontender. No organomegaly or masses felt. Normal bowel sounds heard. Central nervous system: Alert and oriented. No focal neurological deficits. Extremities: No cyanosis or clubbing; 1+ edema bilaterally seen and examined. Skin: No petechiae. Psychiatry: Judgement and insight appear  normal. Mood & affect appropriate.   Data Reviewed: Repeat 2D echo: Pending Comprehensive metabolic panel: Sodium 138, potassium 3.3, chloride 106, bicarb 26, BUN 12 and  creatinine 0.75; normal LFTs appreciated. INR: 1.3 CBC: WBC 7.8, hemoglobin 10.9 and platelet count 334 K.  Family Communication: No family at bedside.  Disposition: Status is: Inpatient Remains inpatient appropriate because: Continue IV diuresis for treatment of acute on chronic heart failure exacerbation.   Planned Discharge Destination: Home  Time spent: 35 minutes  Author: Vassie Loll, MD 08/19/2022 9:18 AM  For on call review [email or web address].

## 2022-08-19 NOTE — Assessment & Plan Note (Signed)
-   Last echo was in May 2023 and showed left ventricular ejection fraction 50-55% with low normal function, tricuspid valve repaired/replaced - Chest x-ray shows cardiomegaly with pulmonary edema and small bilateral pleural effusions - Patient complains of orthopnea, peripheral edema, dyspnea - Noncompliance with Lasix at home for few days -Continue lasix 40mg  BID -Monitor intake and output -Fluid restrictions -Daily weights

## 2022-08-20 DIAGNOSIS — F418 Other specified anxiety disorders: Secondary | ICD-10-CM | POA: Diagnosis not present

## 2022-08-20 DIAGNOSIS — I10 Essential (primary) hypertension: Secondary | ICD-10-CM | POA: Diagnosis not present

## 2022-08-20 DIAGNOSIS — Z720 Tobacco use: Secondary | ICD-10-CM | POA: Diagnosis not present

## 2022-08-20 DIAGNOSIS — I5033 Acute on chronic diastolic (congestive) heart failure: Secondary | ICD-10-CM | POA: Diagnosis not present

## 2022-08-20 LAB — CBC
HCT: 34.9 % — ABNORMAL LOW (ref 36.0–46.0)
Hemoglobin: 11 g/dL — ABNORMAL LOW (ref 12.0–15.0)
MCH: 25.8 pg — ABNORMAL LOW (ref 26.0–34.0)
MCHC: 31.5 g/dL (ref 30.0–36.0)
MCV: 81.7 fL (ref 80.0–100.0)
Platelets: 356 10*3/uL (ref 150–400)
RBC: 4.27 MIL/uL (ref 3.87–5.11)
RDW: 18.1 % — ABNORMAL HIGH (ref 11.5–15.5)
WBC: 8.5 10*3/uL (ref 4.0–10.5)
nRBC: 0 % (ref 0.0–0.2)

## 2022-08-20 LAB — HEPARIN LEVEL (UNFRACTIONATED)
Heparin Unfractionated: 0.3 IU/mL (ref 0.30–0.70)
Heparin Unfractionated: 0.5 IU/mL (ref 0.30–0.70)

## 2022-08-20 LAB — PROTIME-INR
INR: 1.3 — ABNORMAL HIGH (ref 0.8–1.2)
Prothrombin Time: 15.7 seconds — ABNORMAL HIGH (ref 11.4–15.2)

## 2022-08-20 MED ORDER — METOLAZONE 5 MG PO TABS
2.5000 mg | ORAL_TABLET | Freq: Every day | ORAL | Status: AC
  Administered 2022-08-20 – 2022-08-21 (×2): 2.5 mg via ORAL
  Filled 2022-08-20 (×2): qty 1

## 2022-08-20 MED ORDER — WARFARIN SODIUM 7.5 MG PO TABS
7.5000 mg | ORAL_TABLET | Freq: Once | ORAL | Status: AC
  Administered 2022-08-20: 7.5 mg via ORAL
  Filled 2022-08-20: qty 1

## 2022-08-20 MED ORDER — POTASSIUM CHLORIDE CRYS ER 20 MEQ PO TBCR
40.0000 meq | EXTENDED_RELEASE_TABLET | Freq: Every day | ORAL | Status: DC
  Administered 2022-08-20 – 2022-08-24 (×5): 40 meq via ORAL
  Filled 2022-08-20 (×5): qty 2

## 2022-08-20 MED ORDER — CARVEDILOL 12.5 MG PO TABS
12.5000 mg | ORAL_TABLET | Freq: Two times a day (BID) | ORAL | Status: DC
  Administered 2022-08-20 – 2022-08-24 (×9): 12.5 mg via ORAL
  Filled 2022-08-20 (×9): qty 1

## 2022-08-20 NOTE — Progress Notes (Signed)
ANTICOAGULATION CONSULT NOTE -   Pharmacy Consult for heparin/warfarin Indication:  mechanical heart valve  Allergies  Allergen Reactions   Bee Venom Anaphylaxis   Lisinopril Anaphylaxis, Shortness Of Breath, Swelling and Other (See Comments)    Throat swells   Penicillins Shortness Of Breath, Swelling and Other (See Comments)    Has patient had a PCN reaction causing immediate rash, facial/tongue/throat swelling, SOB or lightheadedness with hypotension: Yes Has patient had a PCN reaction causing severe rash involving mucus membranes or skin necrosis: Yes Has patient had a PCN reaction that required hospitalization No Has patient had a PCN reaction occurring within the last 10 years: No If all of the above answers are "NO", then may proceed with Cephalosporin use.    Perflutren Lipid Microsphere Shortness Of Breath and Other (See Comments)    Chest Pain/Tightness, also   Shellfish Allergy Anaphylaxis   Contrast Media [Iodinated Contrast Media] Hives, Itching and Other (See Comments)    Itching and hives to throat, neck, face and arms.   No difficulty breathing.   This was given at [facility] approximately 2015. Contrast Dye    Patient Measurements: Height: 5\' 4"  (162.6 cm) Weight: 104.9 kg (231 lb 4.2 oz) IBW/kg (Calculated) : 54.7 Heparin Dosing Weight: 78  Vital Signs: Temp: 98.1 F (36.7 C) (12/08 1435) Temp Source: Oral (12/08 1435) BP: 136/85 (12/08 1435) Pulse Rate: 117 (12/08 1435)  Labs: Recent Labs    08/18/22 2125 08/18/22 2336 08/19/22 0447 08/19/22 1357 08/19/22 2146 08/20/22 0518 08/20/22 1448  HGB 11.0*  --  10.9*  --  11.4* 11.0*  --   HCT 35.2*  --  34.4*  --  36.2 34.9*  --   PLT 322  --  334  --   --  356  --   LABPROT 15.4*  --  15.7*  --   --  15.7*  --   INR 1.2  --  1.3*  --   --  1.3*  --   HEPARINUNFRC  --   --  <0.10*   < > <0.10* 0.30 0.50  CREATININE 0.75  --  0.75  --   --   --   --   TROPONINIHS 29* 33*  --   --   --    --   --    < > = values in this interval not displayed.     Estimated Creatinine Clearance: 108.2 mL/min (by C-G formula based on SCr of 0.75 mg/dL).   Medical History: Past Medical History:  Diagnosis Date   Anxiety    Arthritis    "lower back" (04/04/2018)   Bipolar disorder (HCC)    Chronic bronchitis (HCC)    Chronic diastolic CHF (congestive heart failure) (HCC)    Chronic lower back pain    DDD (degenerative disc disease), lumbar    Depression    Dyspnea    GERD (gastroesophageal reflux disease)    Headache    "weekly" (04/04/2018), miragrains in past (04/03/2019)   Heart murmur    Hypertension    Mitral valve disease    Normocytic anemia    Obesity    Palpitations    Pericarditis    Pneumonia    Premature atrial contractions    Pulmonary hypertension (HCC)    PVC's (premature ventricular contractions)    a. h/o palpitations with event monitor in 03/2017 showing NSR with PACs/PVCs.   Recurrent mitral valve stenosis and regurgitation s/p mitral valve repair    S/P  mitral valve repair 03/27/2013   Dr. Fransico Michael - [place], [place] - complex valvuloplasty including resuspension of entire posterior leaflet using Gore-tex neochords and 30 mm Edwards Physio ring annuloplasty   S/P redo mitral valve replacement with mechanical valve 04/05/2019   29 mm Sorin Carbomedics Optiform bileaflet mechanical valve   S/P tricuspid valve repair 04/05/2019   28 mm Edwards mc3 ring annuloplasty   Tobacco abuse    Tricuspid regurgitation      Assessment: 41 year old female with history of mitral/tricuspid valve repair currently with mechanical mitral valve supposed to be on warfarin prior to admit but not currently due to issues with cost. Orders to start heparin bridge to warfarin.    Last outpatient INR visit was 06/15/21, but records do show warfarin dispensed 07/23/22.   12/8 HL 0.3> 0.5 , therapeutic.  INR 1.3, subtherapeutic  Goal of Therapy:  Heparin level 0.3-0.7  units/ml INR 2.5-3.5 Monitor platelets by anticoagulation protocol: Yes   Plan:  Coumadin 7.5mg  po x 1 today  Continue heparin at 1750 units/hr Check anti-Xa level  daily. Continue to monitor H&H and platelets.  Isac Sarna, BS Pharm D, BCPS Clinical Pharmacist 08/20/2022 4:35 PM

## 2022-08-20 NOTE — Progress Notes (Signed)
ANTICOAGULATION CONSULT NOTE -   Pharmacy Consult for heparin/warfarin Indication:  mechanical heart valve  Allergies  Allergen Reactions   Bee Venom Anaphylaxis   Lisinopril Anaphylaxis, Shortness Of Breath, Swelling and Other (See Comments)    Throat swells   Penicillins Shortness Of Breath, Swelling and Other (See Comments)    Has patient had a PCN reaction causing immediate rash, facial/tongue/throat swelling, SOB or lightheadedness with hypotension: Yes Has patient had a PCN reaction causing severe rash involving mucus membranes or skin necrosis: Yes Has patient had a PCN reaction that required hospitalization No Has patient had a PCN reaction occurring within the last 10 years: No If all of the above answers are "NO", then may proceed with Cephalosporin use.    Perflutren Lipid Microsphere Shortness Of Breath and Other (See Comments)    Chest Pain/Tightness, also   Shellfish Allergy Anaphylaxis   Contrast Media [Iodinated Contrast Media] Hives, Itching and Other (See Comments)    Itching and hives to throat, neck, face and arms.   No difficulty breathing.   This was given at [facility] approximately 2015. Contrast Dye    Patient Measurements: Height: 5\' 4"  (162.6 cm) Weight: 104.9 kg (231 lb 4.2 oz) IBW/kg (Calculated) : 54.7 Heparin Dosing Weight: 78  Vital Signs: Temp: 97.9 F (36.6 C) (12/08 0509) Temp Source: Oral (12/07 2156) BP: 138/98 (12/08 0509) Pulse Rate: 115 (12/08 0509)  Labs: Recent Labs    08/18/22 2125 08/18/22 2125 08/18/22 2336 08/19/22 0447 08/19/22 1357 08/19/22 2146 08/20/22 0518  HGB 11.0*  --   --  10.9*  --  11.4* 11.0*  HCT 35.2*  --   --  34.4*  --  36.2 34.9*  PLT 322  --   --  334  --   --  356  LABPROT 15.4*  --   --  15.7*  --   --  15.7*  INR 1.2  --   --  1.3*  --   --  1.3*  HEPARINUNFRC  --    < >  --  <0.10* 0.27* <0.10* 0.30  CREATININE 0.75  --   --  0.75  --   --   --   TROPONINIHS 29*  --  33*  --   --    --   --    < > = values in this interval not displayed.     Estimated Creatinine Clearance: 108.2 mL/min (by C-G formula based on SCr of 0.75 mg/dL).   Medical History: Past Medical History:  Diagnosis Date   Anxiety    Arthritis    "lower back" (04/04/2018)   Bipolar disorder (HCC)    Chronic bronchitis (HCC)    Chronic diastolic CHF (congestive heart failure) (HCC)    Chronic lower back pain    DDD (degenerative disc disease), lumbar    Depression    Dyspnea    GERD (gastroesophageal reflux disease)    Headache    "weekly" (04/04/2018), miragrains in past (04/03/2019)   Heart murmur    Hypertension    Mitral valve disease    Normocytic anemia    Obesity    Palpitations    Pericarditis    Pneumonia    Premature atrial contractions    Pulmonary hypertension (HCC)    PVC's (premature ventricular contractions)    a. h/o palpitations with event monitor in 03/2017 showing NSR with PACs/PVCs.   Recurrent mitral valve stenosis and regurgitation s/p mitral valve repair    S/P  mitral valve repair 03/27/2013   Dr. Darcus Austin - [place], [place] - complex valvuloplasty including resuspension of entire posterior leaflet using Gore-tex neochords and 30 mm Edwards Physio ring annuloplasty   S/P redo mitral valve replacement with mechanical valve 04/05/2019   29 mm Sorin Carbomedics Optiform bileaflet mechanical valve   S/P tricuspid valve repair 04/05/2019   28 mm Edwards mc3 ring annuloplasty   Tobacco abuse    Tricuspid regurgitation      Assessment: 41 year old female with history of mitral/tricuspid valve repair currently with mechanical mitral valve supposed to be on warfarin prior to admit but not currently due to issues with cost. Orders to start heparin bridge to warfarin.    Last outpatient INR visit was 06/15/21, but records do show warfarin dispensed 07/23/22.   12/7 evening shift, the heparin found disconnected by night nurse, possibly has been off for over an hour.  Patient also having bleeding around IV site. 12/8 HL 0.3, therapeutic. On low side. Will increase slightly. INR 1.3, subtherapeutic  Goal of Therapy:  Heparin level 0.3-0.7 units/ml INR 2.5-3.5 Monitor platelets by anticoagulation protocol: Yes   Plan:  Coumadin 7.5mg  po x 1 today  Increase heparin to 1750/hr Check anti-Xa level in 6 hours and daily. Continue to monitor H&H and platelets.  Elder Cyphers, BS Pharm D, BCPS Clinical Pharmacist 08/20/2022 8:21 AM

## 2022-08-20 NOTE — Progress Notes (Signed)
Progress Note   Patient: Theresa Crawford DOB: 1981/07/25 DOA: 08/18/2022     2 DOS: the patient was seen and examined on 08/20/2022   Brief hospital course: As per H&P written by Dr.Zierle-Ghosh on 08/19/22 Theresa Crawford is a 41 y.o. female with a history of CHF, mitral valve replacement, tricuspid repair, chronic anemia, and depression/anxiety who presented to the ED on 08/18/22 for shortness of breath. She has not been compliant with her warfarin outpatient. Her symptoms started a few days ago. She was recently switched to Lasix from Bumex and she has felt worse since that change and believes it may not be working as well. She is unsure why she was switched. She has some back pain on the L in the lumbar region tender to palpation. Denies other symptoms. She is currently on 2L [place] which she is not on at home.    ED Course: BNP was 341, Troponin 29, hgb 11.0, PTT 15.4 with INR 1.2, Tox screen positive for cocaine. CXR showed Cardiomegaly with pulmonary edema and small bilateral pleural effusions. ECG showed sinus tachycardia. She was placed on 2L [place] and given lasix for diuresis. She was placed on heparin to bridge to warfarin.  Assessment and Plan: * Acute on chronic CHF exacerbation (based on previous echo results diastolic in nature)  -Update 2D echo -Follow daily weights, low-sodium diet and strict intake and output -Continue IV diuresis -Follow and replete electrolytes as needed -Cessation of recreational habits discussed with patient (underlying history of cocaine abuse). -Patient denies chest pain currently. -Continue to wean off oxygen supplementation as tolerated. - Medication compliance encouraged. -X 1 dose metolazone will be provided.  History of heart valve replacement with mechanical valve -Medication noncompliance appreciated; INR subtherapeutic -Continue bridging with heparin and Coumadin per pharmacy; goal is for INR 2.5-3.5  Tobacco abuse -Cessation  counseling encouraged. -Continue nicotine patch.  Depression with anxiety -Stable mood overall -No suicidal ideation or hallucination. -Continue buspar, SSRI, vistaril, trazodone  Essential hypertension -Continue spironolactone and IV Lasix -Continue as needed hydralazine and start treatment with carvedilol (dose adjusted as patient is still demonstrating sinus tachycardia and elevated blood pressure). -Heart healthy/low-sodium diet discussed with patient.  Class II obesity -Body mass index is 39.7 kg/m. -Diet, portion control and increase physical activity discussed with patient.  Subjective:  No fever, no chest pain.  Still short of breath with activity and having signs of fluid overload.  Physical Exam: Vitals:   08/19/22 2156 08/20/22 0500 08/20/22 0509 08/20/22 1435  BP: (!) 140/80  (!) 138/98 136/85  Pulse: (!) 122  (!) 115 (!) 117  Resp: 20  20 20   Temp: 98.6 F (37 C)  97.9 F (36.6 C) 98.1 F (36.7 C)  TempSrc: Oral   Oral  SpO2: 96%  93% 96%  Weight:  104.9 kg    Height:       General exam: Alert, awake, oriented x 3; requiring 2 L nasal cannula supplementation to maintain saturation.  Still expressing orthopnea and short winded sensation with activity.  No chest pain Respiratory system: Positive rales at the bases; no using accessory muscles.  No wheezing. Cardiovascular system: sinus tachycardia appreciated.  No rubs or gallops; no JVD. Gastrointestinal system: Abdomen is obese, nondistended, soft and nontender. No organomegaly or masses felt. Normal bowel sounds heard. Central nervous system: Alert and oriented. No focal neurological deficits. Extremities: No cyanosis or clubbing; 1-2+ edema appreciated bilaterally. Skin: No petechiae. Psychiatry: Judgement and insight appear normal. Mood &  affect appropriate.   Data Reviewed: Repeat 2D echo: 1. Left ventricular ejection fraction, by estimation, is 50%. The left  ventricle has low normal function. The left  ventricle has no regional wall  motion abnormalities. There is mild left ventricular hypertrophy. Left  ventricular diastolic parameters are   indeterminate.   2. RV is flattened in systole suggesting RV pressure overload. . Right  ventricular systolic function is mildly reduced. The right ventricular  size is normal. There is normal pulmonary artery systolic pressure.   3. Left atrial size was mildly dilated.   4. The mitral valve has been repaired/replaced. No evidence of mitral  valve regurgitation.   5. 28 mm Edwards mc3 ring annuloplasty TV . The tricuspid valve is has  been repaired/replaced.   6. The aortic valve is tricuspid. There is mild calcification of the  aortic valve. There is mild thickening of the aortic valve. Aortic valve  regurgitation is not visualized. No aortic stenosis is present.   7. The inferior vena cava is normal in size with greater than 50%  respiratory variability, suggesting right atrial pressure of 3 mmHg.  CBC: WBCs 8.5, hemoglobin 11.0 and platelet count 356 K -INR: 1.3   Family Communication: No family at bedside.  Disposition: Status is: Inpatient Remains inpatient appropriate because: Continue IV diuresis for treatment of acute on chronic heart failure exacerbation.   Planned Discharge Destination: Home  Time spent: 35 minutes  Author: Vassie Loll, MD 08/20/2022 4:25 PM  For on call review [email or web address].

## 2022-08-21 DIAGNOSIS — I5033 Acute on chronic diastolic (congestive) heart failure: Secondary | ICD-10-CM | POA: Diagnosis not present

## 2022-08-21 DIAGNOSIS — I10 Essential (primary) hypertension: Secondary | ICD-10-CM | POA: Diagnosis not present

## 2022-08-21 DIAGNOSIS — Z952 Presence of prosthetic heart valve: Secondary | ICD-10-CM | POA: Diagnosis not present

## 2022-08-21 LAB — CBC
HCT: 40.4 % (ref 36.0–46.0)
Hemoglobin: 12.4 g/dL (ref 12.0–15.0)
MCH: 25.4 pg — ABNORMAL LOW (ref 26.0–34.0)
MCHC: 30.7 g/dL (ref 30.0–36.0)
MCV: 82.6 fL (ref 80.0–100.0)
Platelets: 408 10*3/uL — ABNORMAL HIGH (ref 150–400)
RBC: 4.89 MIL/uL (ref 3.87–5.11)
RDW: 18.5 % — ABNORMAL HIGH (ref 11.5–15.5)
WBC: 9 10*3/uL (ref 4.0–10.5)
nRBC: 0 % (ref 0.0–0.2)

## 2022-08-21 LAB — BASIC METABOLIC PANEL
Anion gap: 6 (ref 5–15)
BUN: 16 mg/dL (ref 6–20)
CO2: 26 mmol/L (ref 22–32)
Calcium: 9 mg/dL (ref 8.9–10.3)
Chloride: 104 mmol/L (ref 98–111)
Creatinine, Ser: 0.84 mg/dL (ref 0.44–1.00)
GFR, Estimated: >60 mL/min (ref >60)
Glucose, Bld: 119 mg/dL — ABNORMAL HIGH (ref 70–99)
Potassium: 4.2 mmol/L (ref 3.5–5.1)
Sodium: 136 mmol/L (ref 135–145)

## 2022-08-21 LAB — PROTIME-INR
INR: 1.2 (ref 0.8–1.2)
Prothrombin Time: 15.4 seconds — ABNORMAL HIGH (ref 11.4–15.2)

## 2022-08-21 LAB — HEPARIN LEVEL (UNFRACTIONATED): Heparin Unfractionated: 0.47 IU/mL (ref 0.30–0.70)

## 2022-08-21 MED ORDER — WARFARIN SODIUM 5 MG PO TABS
10.0000 mg | ORAL_TABLET | Freq: Once | ORAL | Status: AC
  Administered 2022-08-21: 10 mg via ORAL
  Filled 2022-08-21: qty 2

## 2022-08-21 NOTE — Progress Notes (Signed)
Progress Note   Patient: Theresa Crawford QQV:[id number] DOB: [birth year] DOA: 08/18/2022     3 DOS: the patient was seen and examined on 08/21/2022   Brief hospital course:  [name] is a 41 y.o. female with a history of CHF, mitral valve replacement, tricuspid repair, chronic anemia, and depression/anxiety who presented to the ED on 08/18/22 for shortness of breath. She has not been compliant with her warfarin outpatient. Her symptoms started a few days ago. She was recently switched to Lasix from Bumex and she has felt worse since that change and believes it may not be working as well. She is unsure why she was switched. She has some back pain on the L in the lumbar region tender to palpation. Denies other symptoms. She is currently on 2L [place] which she is not on at home.    ED Course: BNP was 341, Troponin 29, hgb 11.0, PTT 15.4 with INR 1.2, Tox screen positive for cocaine. CXR showed Cardiomegaly with pulmonary edema and small bilateral pleural effusions. ECG showed sinus tachycardia. She was placed on 2L [place] and given lasix for diuresis. She was placed on heparin to bridge to warfarin.  Assessment and Plan:  * Acute on chronic CHF exacerbation (based on previous echo results diastolic in nature)  -Follow daily weights, low-sodium diet and strict intake and output -Continue IV diuresis -Follow and replete electrolytes as needed -Cessation of recreational habits discussed with patient (underlying history of cocaine abuse). -Patient denies chest pain currently. -Continue to wean off oxygen supplementation as tolerated. - Medication compliance encouraged. -Remains positive balance over last 24 hours, will give additional dose of Zaroxolyn.  History of heart valve replacement with mechanical valve -Medication noncompliance appreciated; INR subtherapeutic -Remains significantly subtherapeutic at 1.2, continue with heparin GTT, for target INR 2.5 -3.5.    Tobacco abuse -Cessation  counseling encouraged. -Continue nicotine patch.  Depression with anxiety -Stable mood overall -No suicidal ideation or hallucination. -Continue buspar, SSRI, vistaril, trazodone  Essential hypertension -Continue spironolactone and IV Lasix -Continue as needed hydralazine and start treatment with carvedilol (dose adjusted as patient is still demonstrating sinus tachycardia and elevated blood pressure). -Heart healthy/low-sodium diet discussed with patient.  Class II obesity -Body mass index is 38.64 kg/m. -Diet, portion control and increase physical activity discussed with patient.  Substance/cocain  abuse - UDS is positive for cocaine, she was counseled.  Subjective:  No fever, no chest pain.  Ports dyspnea has improved  Physical Exam: Vitals:   08/20/22 1435 08/20/22 2035 08/21/22 0434 08/21/22 0437  BP: 136/85 111/65 124/85   Pulse: (!) 117 (!) 115 (!) 117   Resp: 20 20    Temp: 98.1 F (36.7 C) 98.6 F (37 C) 98.6 F (37 C)   TempSrc: Oral Oral    SpO2: 96% 95% 96%   Weight:    102.1 kg  Height:         Awake Alert, Oriented X 3, No new F.N deficits, Normal affect, diminished air entry at the bases, she remains on 2 L oxygen. Symmetrical Chest wall movement, Good air movement bilaterally, CTAB RRR,No Gallops,Rubs , clinical valve click. +ve B.Sounds, Abd Soft, No tenderness, No rebound - guarding or rigidity. No Cyanosis, Clubbing, no edema today   Data Reviewed: Repeat 2D echo: 1. Left ventricular ejection fraction, by estimation, is 50%. The left  ventricle has low normal function. The left ventricle has no regional wall  motion abnormalities. There is mild left ventricular hypertrophy. Left  ventricular  diastolic parameters are   indeterminate.   2. RV is flattened in systole suggesting RV pressure overload. . Right  ventricular systolic function is mildly reduced. The right ventricular  size is normal. There is normal pulmonary artery systolic pressure.    3. Left atrial size was mildly dilated.   4. The mitral valve has been repaired/replaced. No evidence of mitral  valve regurgitation.   5. 28 mm Edwards mc3 ring annuloplasty TV . The tricuspid valve is has  been repaired/replaced.   6. The aortic valve is tricuspid. There is mild calcification of the  aortic valve. There is mild thickening of the aortic valve. Aortic valve  regurgitation is not visualized. No aortic stenosis is present.   7. The inferior vena cava is normal in size with greater than 50%  respiratory variability, suggesting right atrial pressure of 3 mmHg.  CBC: WBCs 8.5, hemoglobin 11.0 and platelet count 356 K -INR: 1.3   Family Communication: No family at bedside.  Disposition: Status is: Inpatient Remains inpatient appropriate because: Continue IV diuresis for treatment of acute on chronic heart failure exacerbation.   Planned Discharge Destination: Home  Time spent: 35 minutes  Author: Phillips Climes, MD 08/21/2022 12:49 PM  For on call review [email or web address].

## 2022-08-21 NOTE — Progress Notes (Signed)
ANTICOAGULATION CONSULT NOTE -   Pharmacy Consult for heparin/warfarin Indication:  mechanical heart valve  Allergies  Allergen Reactions   Bee Venom Anaphylaxis   Lisinopril Anaphylaxis, Shortness Of Breath, Swelling and Other (See Comments)    Throat swells   Penicillins Shortness Of Breath, Swelling and Other (See Comments)    Has patient had a PCN reaction causing immediate rash, facial/tongue/throat swelling, SOB or lightheadedness with hypotension: Yes Has patient had a PCN reaction causing severe rash involving mucus membranes or skin necrosis: Yes Has patient had a PCN reaction that required hospitalization No Has patient had a PCN reaction occurring within the last 10 years: No If all of the above answers are "NO", then may proceed with Cephalosporin use.    Perflutren Lipid Microsphere Shortness Of Breath and Other (See Comments)    Chest Pain/Tightness, also   Shellfish Allergy Anaphylaxis   Contrast Media [Iodinated Contrast Media] Hives, Itching and Other (See Comments)    Itching and hives to throat, neck, face and arms.   No difficulty breathing.   This was given at [facility] approximately 2015. Contrast Dye    Patient Measurements: Height: 5\' 4"  (162.6 cm) Weight: 102.1 kg (225 lb 1.4 oz) IBW/kg (Calculated) : 54.7 Heparin Dosing Weight: 78  Vital Signs: Temp: 98.6 F (37 C) (12/09 0434) BP: 124/85 (12/09 0434) Pulse Rate: 117 (12/09 0434)  Labs: Recent Labs    08/18/22 2125 08/18/22 2336 08/19/22 0447 08/19/22 1357 08/19/22 2146 08/20/22 0518 08/20/22 1448 08/21/22 0431  HGB 11.0*  --  10.9*  --  11.4* 11.0*  --  12.4  HCT 35.2*  --  34.4*  --  36.2 34.9*  --  40.4  PLT 322  --  334  --   --  356  --  408*  LABPROT 15.4*  --  15.7*  --   --  15.7*  --  15.4*  INR 1.2  --  1.3*  --   --  1.3*  --  1.2  HEPARINUNFRC  --   --  <0.10*   < > <0.10* 0.30 0.50 0.47  CREATININE 0.75  --  0.75  --   --   --   --   --   TROPONINIHS 29* 33*   --   --   --   --   --   --    < > = values in this interval not displayed.     Estimated Creatinine Clearance: 106.6 mL/min (by C-G formula based on SCr of 0.75 mg/dL).   Medical History: Past Medical History:  Diagnosis Date   Anxiety    Arthritis    "lower back" (04/04/2018)   Bipolar disorder (HCC)    Chronic bronchitis (HCC)    Chronic diastolic CHF (congestive heart failure) (HCC)    Chronic lower back pain    DDD (degenerative disc disease), lumbar    Depression    Dyspnea    GERD (gastroesophageal reflux disease)    Headache    "weekly" (04/04/2018), miragrains in past (04/03/2019)   Heart murmur    Hypertension    Mitral valve disease    Normocytic anemia    Obesity    Palpitations    Pericarditis    Pneumonia    Premature atrial contractions    Pulmonary hypertension (HCC)    PVC's (premature ventricular contractions)    a. h/o palpitations with event monitor in 03/2017 showing NSR with PACs/PVCs.   Recurrent mitral valve stenosis and  regurgitation s/p mitral valve repair    S/P mitral valve repair 03/27/2013   Dr. Darcus Austin - [place], [place] - complex valvuloplasty including resuspension of entire posterior leaflet using Gore-tex neochords and 30 mm Edwards Physio ring annuloplasty   S/P redo mitral valve replacement with mechanical valve 04/05/2019   29 mm Sorin Carbomedics Optiform bileaflet mechanical valve   S/P tricuspid valve repair 04/05/2019   28 mm Edwards mc3 ring annuloplasty   Tobacco abuse    Tricuspid regurgitation      Assessment: 41 year old female with history of mitral/tricuspid valve repair currently with mechanical mitral valve supposed to be on warfarin prior to admit but not currently due to issues with cost. Orders to start heparin bridge to warfarin.    Last outpatient INR visit was 06/15/21, but records do show warfarin dispensed 07/23/22.   12/8 HL 0.3> 0.5> 0.47 , therapeutic.  INR 1.3> 1.2, subtherapeutic  Goal of Therapy:   Heparin level 0.3-0.7 units/ml INR 2.5-3.5 Monitor platelets by anticoagulation protocol: Yes   Plan:  Coumadin 10mg  po x 1 today  Continue heparin at 1750 units/hr Check anti-Xa level  daily. Continue to monitor H&H and platelets.  , BS Pharm D, BCPS Clinical Pharmacist 08/21/2022 9:15 AM

## 2022-08-21 NOTE — Progress Notes (Signed)
SATURATION QUALIFICATIONS:  Patient Saturations on Room Air at Rest = 98%  Patient Saturations on Room Air while Ambulating = 94%  No need for oxygen at this time.

## 2022-08-22 DIAGNOSIS — F418 Other specified anxiety disorders: Secondary | ICD-10-CM | POA: Diagnosis not present

## 2022-08-22 DIAGNOSIS — Z720 Tobacco use: Secondary | ICD-10-CM | POA: Diagnosis not present

## 2022-08-22 DIAGNOSIS — I10 Essential (primary) hypertension: Secondary | ICD-10-CM | POA: Diagnosis not present

## 2022-08-22 DIAGNOSIS — I5033 Acute on chronic diastolic (congestive) heart failure: Secondary | ICD-10-CM | POA: Diagnosis not present

## 2022-08-22 LAB — BASIC METABOLIC PANEL
Anion gap: 7 (ref 5–15)
BUN: 21 mg/dL — ABNORMAL HIGH (ref 6–20)
CO2: 24 mmol/L (ref 22–32)
Calcium: 9.3 mg/dL (ref 8.9–10.3)
Chloride: 102 mmol/L (ref 98–111)
Creatinine, Ser: 0.88 mg/dL (ref 0.44–1.00)
GFR, Estimated: >60 mL/min (ref >60)
Glucose, Bld: 117 mg/dL — ABNORMAL HIGH (ref 70–99)
Potassium: 4.1 mmol/L (ref 3.5–5.1)
Sodium: 133 mmol/L — ABNORMAL LOW (ref 135–145)

## 2022-08-22 LAB — PROTIME-INR
INR: 1.5 — ABNORMAL HIGH (ref 0.8–1.2)
Prothrombin Time: 18 seconds — ABNORMAL HIGH (ref 11.4–15.2)

## 2022-08-22 LAB — CBC
HCT: 41.6 % (ref 36.0–46.0)
Hemoglobin: 13 g/dL (ref 12.0–15.0)
MCH: 25.2 pg — ABNORMAL LOW (ref 26.0–34.0)
MCHC: 31.3 g/dL (ref 30.0–36.0)
MCV: 80.8 fL (ref 80.0–100.0)
Platelets: 386 10*3/uL (ref 150–400)
RBC: 5.15 MIL/uL — ABNORMAL HIGH (ref 3.87–5.11)
RDW: 18.4 % — ABNORMAL HIGH (ref 11.5–15.5)
WBC: 10.6 10*3/uL — ABNORMAL HIGH (ref 4.0–10.5)
nRBC: 0 % (ref 0.0–0.2)

## 2022-08-22 LAB — HEPARIN LEVEL (UNFRACTIONATED): Heparin Unfractionated: 0.64 IU/mL (ref 0.30–0.70)

## 2022-08-22 MED ORDER — METOLAZONE 5 MG PO TABS
5.0000 mg | ORAL_TABLET | Freq: Once | ORAL | Status: AC
  Administered 2022-08-23: 5 mg via ORAL
  Filled 2022-08-22: qty 1

## 2022-08-22 MED ORDER — WARFARIN SODIUM 5 MG PO TABS
10.0000 mg | ORAL_TABLET | Freq: Once | ORAL | Status: AC
  Administered 2022-08-22: 10 mg via ORAL
  Filled 2022-08-22: qty 2

## 2022-08-22 NOTE — Progress Notes (Signed)
ANTICOAGULATION CONSULT NOTE -   Pharmacy Consult for heparin/warfarin Indication:  mechanical heart valve  Allergies  Allergen Reactions   Bee Venom Anaphylaxis   Lisinopril Anaphylaxis, Shortness Of Breath, Swelling and Other (See Comments)    Throat swells   Penicillins Shortness Of Breath, Swelling and Other (See Comments)    Has patient had a PCN reaction causing immediate rash, facial/tongue/throat swelling, SOB or lightheadedness with hypotension: Yes Has patient had a PCN reaction causing severe rash involving mucus membranes or skin necrosis: Yes Has patient had a PCN reaction that required hospitalization No Has patient had a PCN reaction occurring within the last 10 years: No If all of the above answers are "NO", then may proceed with Cephalosporin use.    Perflutren Lipid Microsphere Shortness Of Breath and Other (See Comments)    Chest Pain/Tightness, also   Shellfish Allergy Anaphylaxis   Contrast Media [Iodinated Contrast Media] Hives, Itching and Other (See Comments)    Itching and hives to throat, neck, face and arms.   No difficulty breathing.   This was given at [facility] approximately 2015. Contrast Dye    Patient Measurements: Height: 5\' 4"  (162.6 cm) Weight: 101.3 kg (223 lb 5.2 oz) IBW/kg (Calculated) : 54.7 Heparin Dosing Weight: 78  Vital Signs: Temp: 98 F (36.7 C) (12/10 0535) BP: 104/65 (12/10 0535) Pulse Rate: 112 (12/10 0535)  Labs: Recent Labs    08/20/22 0518 08/20/22 1448 08/21/22 0431 08/21/22 0831 08/22/22 0500  HGB 11.0*  --  12.4  --  13.0  HCT 34.9*  --  40.4  --  41.6  PLT 356  --  408*  --  386  LABPROT 15.7*  --  15.4*  --  18.0*  INR 1.3*  --  1.2  --  1.5*  HEPARINUNFRC 0.30 0.50 0.47  --  0.64  CREATININE  --   --   --  0.84 0.88     Estimated Creatinine Clearance: 96.4 mL/min (by C-G formula based on SCr of 0.88 mg/dL).   Medical History: Past Medical History:  Diagnosis Date   Anxiety     Arthritis    "lower back" (04/04/2018)   Bipolar disorder (HCC)    Chronic bronchitis (HCC)    Chronic diastolic CHF (congestive heart failure) (HCC)    Chronic lower back pain    DDD (degenerative disc disease), lumbar    Depression    Dyspnea    GERD (gastroesophageal reflux disease)    Headache    "weekly" (04/04/2018), miragrains in past (04/03/2019)   Heart murmur    Hypertension    Mitral valve disease    Normocytic anemia    Obesity    Palpitations    Pericarditis    Pneumonia    Premature atrial contractions    Pulmonary hypertension (HCC)    PVC's (premature ventricular contractions)    a. h/o palpitations with event monitor in 03/2017 showing NSR with PACs/PVCs.   Recurrent mitral valve stenosis and regurgitation s/p mitral valve repair    S/P mitral valve repair 03/27/2013   Dr. 03/29/2013 - Darcus Austin, Natale Milch - complex valvuloplasty including resuspension of entire posterior leaflet using Gore-tex neochords and 30 mm Edwards Physio ring annuloplasty   S/P redo mitral valve replacement with mechanical valve 04/05/2019   29 mm Sorin Carbomedics Optiform bileaflet mechanical valve   S/P tricuspid valve repair 04/05/2019   28 mm Edwards mc3 ring annuloplasty   Tobacco abuse    Tricuspid regurgitation  Assessment: 41 year old female with history of mitral/tricuspid valve repair currently with mechanical mitral valve supposed to be on warfarin prior to admit but not currently due to issues with cost. Orders to start heparin bridge to warfarin.    Last outpatient INR visit was 06/15/21, but records do show warfarin dispensed 07/23/22.   12/8 HL 0.3> 0.5> 0.47> 0.64 , therapeutic.  INR 1.3> 1.2> 1.5 , subtherapeutic  Goal of Therapy:  Heparin level 0.3-0.7 units/ml INR 2.5-3.5 Monitor platelets by anticoagulation protocol: Yes   Plan:  Coumadin 10mg  po x 1  more today  Continue heparin at 1750 units/hr Check anti-Xa level  daily. Continue to monitor H&H and  platelets.  , BS Pharm D, BCPS Clinical Pharmacist 08/22/2022 10:35 AM

## 2022-08-22 NOTE — Progress Notes (Signed)
Progress Note   Patient: Theresa Crawford VQQ:[id number] DOB: [birth year] DOA: 08/18/2022     4 DOS: the patient was seen and examined on 08/22/2022   Brief hospital course: As per H&P written by Dr.Zierle-Ghosh on 08/19/22 Theresa Crawford is a 41 y.o. female with a history of CHF, mitral valve replacement, tricuspid repair, chronic anemia, and depression/anxiety who presented to the ED on 08/18/22 for shortness of breath. She has not been compliant with her warfarin outpatient. Her symptoms started a few days ago. She was recently switched to Lasix from Bumex and she has felt worse since that change and believes it may not be working as well. She is unsure why she was switched. She has some back pain on the L in the lumbar region tender to palpation. Denies other symptoms. She is currently on 2L [place] which she is not on at home.    ED Course: BNP was 341, Troponin 29, hgb 11.0, PTT 15.4 with INR 1.2, Tox screen positive for cocaine. CXR showed Cardiomegaly with pulmonary edema and small bilateral pleural effusions. ECG showed sinus tachycardia. She was placed on 2L [place] and given lasix for diuresis. She was placed on heparin to bridge to warfarin.  Assessment and Plan: * Acute on chronic CHF exacerbation (based on previous echo results diastolic in nature)  -Update 2D echo -Follow daily weights, low-sodium diet and strict intake and output -Continue IV diuresis -Continue to follow and replete electrolytes as needed -Cessation of recreational habits discussed with patient (underlying history of cocaine abuse). -Patient denies chest pain currently and overall expressed improvement in her breathing.. -Continue to wean off oxygen supplementation as tolerated. - Medication compliance encouraged. -Will repeat metolazone x 1 dose.  History of heart valve replacement with mechanical valve -Medication noncompliance appreciated; INR subtherapeutic -Continue bridging with heparin and Coumadin per pharmacy;  goal is for INR 2.5-3.5 -Current INR 1.5.  Tobacco abuse -Cessation counseling encouraged. -Continue nicotine patch.  Depression with anxiety -Stable mood overall -No suicidal ideation or hallucination. -Continue buspar, SSRI, vistaril, trazodone  Essential hypertension -Continue spironolactone and IV Lasix -Continue as needed hydralazine and continue treatment with carvedilol. -Heart healthy/low-sodium diet discussed with patient.  Class II obesity -Body mass index is 38.33 kg/m. -Diet, portion control and increase physical activity discussed with patient.  Subjective:  No fever, no chest pain; no nausea vomiting.  Reports breathing continues to improve.  At rest not requiring oxygen supplementation.  Still short winded with activity.  Physical Exam: Vitals:   08/21/22 1959 08/22/22 0500 08/22/22 0535 08/22/22 1356  BP: 115/73  104/65 111/72  Pulse: (!) 116  (!) 112 98  Resp: 20   18  Temp: 99 F (37.2 C)  98 F (36.7 C) 98.4 F (36.9 C)  TempSrc: Oral   Oral  SpO2: 100%   96%  Weight:  101.3 kg    Height:       General exam: Alert, awake, oriented x 3;.  No chest pain, no nausea, no vomiting.  Still short winded with activity but improving. Respiratory system: Good saturation on room air at rest; decreased breath sounds at the bases, no wheezing or frank crackles appreciated.  No using accessory muscle. Cardiovascular system:Rate controlled. No rubs or gallops. Gastrointestinal system: Abdomen is obese, nondistended, soft and nontender. No organomegaly or masses felt. Normal bowel sounds heard. Central nervous system: Alert and oriented. No focal neurological deficits. Extremities: No cyanosis or clubbing.  Trace edema appreciated bilaterally. Skin: No petechiae. Psychiatry: Judgement and  insight appear normal. Mood & affect appropriate.   Data Reviewed: Repeat 2D echo: 1. Left ventricular ejection fraction, by estimation, is 50%. The left  ventricle has low  normal function. The left ventricle has no regional wall  motion abnormalities. There is mild left ventricular hypertrophy. Left  ventricular diastolic parameters are   indeterminate.   2. RV is flattened in systole suggesting RV pressure overload. . Right  ventricular systolic function is mildly reduced. The right ventricular  size is normal. There is normal pulmonary artery systolic pressure.   3. Left atrial size was mildly dilated.   4. The mitral valve has been repaired/replaced. No evidence of mitral  valve regurgitation.   5. 28 mm Edwards mc3 ring annuloplasty TV . The tricuspid valve is has  been repaired/replaced.   6. The aortic valve is tricuspid. There is mild calcification of the  aortic valve. There is mild thickening of the aortic valve. Aortic valve  regurgitation is not visualized. No aortic stenosis is present.   7. The inferior vena cava is normal in size with greater than 50%  respiratory variability, suggesting right atrial pressure of 3 mmHg.   Basic metabolic panel: Sodium 133, potassium 4.1, chloride 102, bicarb 24, BUN 21 and creatinine 0.88 CBC: WBCs 10.6, hemoglobin 13.0 and platelet count 386 K -INR: 1.5   Family Communication: No family at bedside.  Disposition: Status is: Inpatient Remains inpatient appropriate because: Continue IV diuresis for treatment of acute on chronic heart failure exacerbation.   Planned Discharge Destination: Home  Time spent: 35 minutes  Author: Vassie Loll, MD 08/22/2022 5:33 PM  For on call review [email or web address].

## 2022-08-22 NOTE — Plan of Care (Signed)
  Problem: Education: Goal: Ability to demonstrate management of disease process will improve Outcome: Progressing   Problem: Education: Goal: Knowledge of General Education information will improve Description: Including pain rating scale, medication(s)/side effects and non-pharmacologic comfort measures Outcome: Progressing   

## 2022-08-22 NOTE — Plan of Care (Signed)
  Problem: Clinical Measurements: Goal: Will remain free from infection Outcome: Progressing   Problem: Clinical Measurements: Goal: Diagnostic test results will improve Outcome: Progressing   Problem: Clinical Measurements: Goal: Respiratory complications will improve Outcome: Progressing   Problem: Activity: Goal: Risk for activity intolerance will decrease Outcome: Progressing   

## 2022-08-23 DIAGNOSIS — F418 Other specified anxiety disorders: Secondary | ICD-10-CM | POA: Diagnosis not present

## 2022-08-23 DIAGNOSIS — I10 Essential (primary) hypertension: Secondary | ICD-10-CM | POA: Diagnosis not present

## 2022-08-23 DIAGNOSIS — Z720 Tobacco use: Secondary | ICD-10-CM | POA: Diagnosis not present

## 2022-08-23 DIAGNOSIS — I5033 Acute on chronic diastolic (congestive) heart failure: Secondary | ICD-10-CM | POA: Diagnosis not present

## 2022-08-23 LAB — CBC
HCT: 42.9 % (ref 36.0–46.0)
Hemoglobin: 13.5 g/dL (ref 12.0–15.0)
MCH: 25.3 pg — ABNORMAL LOW (ref 26.0–34.0)
MCHC: 31.5 g/dL (ref 30.0–36.0)
MCV: 80.3 fL (ref 80.0–100.0)
Platelets: 360 10*3/uL (ref 150–400)
RBC: 5.34 MIL/uL — ABNORMAL HIGH (ref 3.87–5.11)
RDW: 17.9 % — ABNORMAL HIGH (ref 11.5–15.5)
WBC: 8.5 10*3/uL (ref 4.0–10.5)
nRBC: 0 % (ref 0.0–0.2)

## 2022-08-23 LAB — BASIC METABOLIC PANEL
Anion gap: 8 (ref 5–15)
BUN: 30 mg/dL — ABNORMAL HIGH (ref 6–20)
CO2: 26 mmol/L (ref 22–32)
Calcium: 9.2 mg/dL (ref 8.9–10.3)
Chloride: 98 mmol/L (ref 98–111)
Creatinine, Ser: 1.12 mg/dL — ABNORMAL HIGH (ref 0.44–1.00)
GFR, Estimated: >60 mL/min (ref >60)
Glucose, Bld: 147 mg/dL — ABNORMAL HIGH (ref 70–99)
Potassium: 4.3 mmol/L (ref 3.5–5.1)
Sodium: 132 mmol/L — ABNORMAL LOW (ref 135–145)

## 2022-08-23 LAB — PROTIME-INR
INR: 2 — ABNORMAL HIGH (ref 0.8–1.2)
Prothrombin Time: 22.8 seconds — ABNORMAL HIGH (ref 11.4–15.2)

## 2022-08-23 LAB — HEPARIN LEVEL (UNFRACTIONATED): Heparin Unfractionated: 0.95 IU/mL — ABNORMAL HIGH (ref 0.30–0.70)

## 2022-08-23 MED ORDER — HEPARIN (PORCINE) 25000 UT/250ML-% IV SOLN: 1600.0000 [IU]/h | INTRAVENOUS | Status: AC

## 2022-08-23 MED ORDER — FUROSEMIDE 40 MG PO TABS
60.0000 mg | ORAL_TABLET | Freq: Every day | ORAL | Status: DC
  Administered 2022-08-24: 60 mg via ORAL
  Filled 2022-08-23: qty 1

## 2022-08-23 MED ORDER — ENOXAPARIN SODIUM 100 MG/ML IJ SOSY
100.0000 mg | PREFILLED_SYRINGE | Freq: Two times a day (BID) | INTRAMUSCULAR | Status: DC
  Administered 2022-08-23 – 2022-08-24 (×3): 100 mg via SUBCUTANEOUS
  Filled 2022-08-23 (×4): qty 1

## 2022-08-23 MED ORDER — WARFARIN SODIUM 7.5 MG PO TABS
7.5000 mg | ORAL_TABLET | Freq: Once | ORAL | Status: AC
  Administered 2022-08-23: 7.5 mg via ORAL
  Filled 2022-08-23: qty 1

## 2022-08-23 NOTE — Progress Notes (Signed)
ANTICOAGULATION CONSULT NOTE -   Pharmacy Consult for enoxaparin/warfarin Indication:  mechanical heart valve  Allergies  Allergen Reactions   Bee Venom Anaphylaxis   Lisinopril Anaphylaxis, Shortness Of Breath, Swelling and Other (See Comments)    Throat swells   Penicillins Shortness Of Breath, Swelling and Other (See Comments)    Has patient had a PCN reaction causing immediate rash, facial/tongue/throat swelling, SOB or lightheadedness with hypotension: Yes Has patient had a PCN reaction causing severe rash involving mucus membranes or skin necrosis: Yes Has patient had a PCN reaction that required hospitalization No Has patient had a PCN reaction occurring within the last 10 years: No If all of the above answers are "NO", then may proceed with Cephalosporin use.    Perflutren Lipid Microsphere Shortness Of Breath and Other (See Comments)    Chest Pain/Tightness, also   Shellfish Allergy Anaphylaxis   Contrast Media [Iodinated Contrast Media] Hives, Itching and Other (See Comments)    Itching and hives to throat, neck, face and arms.   No difficulty breathing.   This was given at [facility] approximately 2015. Contrast Dye    Patient Measurements: Height: 5\' 4"  (162.6 cm) Weight: 101.2 kg (223 lb 1.7 oz) IBW/kg (Calculated) : 54.7 Heparin Dosing Weight: 78  Vital Signs: Temp: 97.3 F (36.3 C) (12/11 0521) BP: 107/75 (12/11 0521) Pulse Rate: 114 (12/11 0521)  Labs: Recent Labs    08/21/22 0431 08/21/22 0831 08/22/22 0500 08/23/22 0458  HGB 12.4  --  13.0 13.5  HCT 40.4  --  41.6 42.9  PLT 408*  --  386 360  LABPROT 15.4*  --  18.0* 22.8*  INR 1.2  --  1.5* 2.0*  HEPARINUNFRC 0.47  --  0.64 0.95*  CREATININE  --  0.84 0.88 1.12*     Estimated Creatinine Clearance: 75.7 mL/min (A) (by C-G formula based on SCr of 1.12 mg/dL (H)).   Medical History: Past Medical History:  Diagnosis Date   Anxiety    Arthritis    "lower back" (04/04/2018)    Bipolar disorder (HCC)    Chronic bronchitis (HCC)    Chronic diastolic CHF (congestive heart failure) (HCC)    Chronic lower back pain    DDD (degenerative disc disease), lumbar    Depression    Dyspnea    GERD (gastroesophageal reflux disease)    Headache    "weekly" (04/04/2018), miragrains in past (04/03/2019)   Heart murmur    Hypertension    Mitral valve disease    Normocytic anemia    Obesity    Palpitations    Pericarditis    Pneumonia    Premature atrial contractions    Pulmonary hypertension (HCC)    PVC's (premature ventricular contractions)    a. h/o palpitations with event monitor in 03/2017 showing NSR with PACs/PVCs.   Recurrent mitral valve stenosis and regurgitation s/p mitral valve repair    S/P mitral valve repair 03/27/2013   Dr. 03/29/2013 - Darcus Austin, [place] - complex valvuloplasty including resuspension of entire posterior leaflet using Gore-tex neochords and 30 mm Edwards Physio ring annuloplasty   S/P redo mitral valve replacement with mechanical valve 04/05/2019   29 mm Sorin Carbomedics Optiform bileaflet mechanical valve   S/P tricuspid valve repair 04/05/2019   28 mm Edwards mc3 ring annuloplasty   Tobacco abuse    Tricuspid regurgitation      Assessment: 41 year old female with history of mitral/tricuspid valve repair currently with mechanical mitral valve supposed to  be on warfarin prior to admit but not currently due to issues with cost. Orders to start heparin bridge to warfarin.    Last outpatient INR visit was 06/15/21, but records do show warfarin dispensed 07/23/22.   12/11 MD asked to transition to lovenox for bridge until INR at goal INR 1.3> 1.2> 1.5> 2 , subtherapeutic with goal of 2.5 but increasing   Goal of Therapy:  Heparin level 0.3-0.7 units/ml INR 2.5-3.5 Monitor platelets by anticoagulation protocol: Yes   Plan:  Coumadin 7.5mg  po x 1  today Daily PT-INR  D/C heparin, start lovenox 1 hr after heparin stopped Lovenox  1mg /kg (100mg ) sq q12h until INR therapeutic Continue to monitor H&H and platelets.  , BS Pharm D, BCPS Clinical Pharmacist 08/23/2022 1:29 PM

## 2022-08-23 NOTE — Plan of Care (Signed)

## 2022-08-23 NOTE — Progress Notes (Signed)
Progress Note   Patient: Theresa Crawford:[id number] DOB: 08-24-1981 DOA: 08/18/2022     5 DOS: the patient was seen and examined on 08/23/2022   Brief hospital course: As per H&P written by Dr.Zierle-Ghosh on 08/19/22 Theresa Crawford is a 41 y.o. female with a history of CHF, mitral valve replacement, tricuspid repair, chronic anemia, and depression/anxiety who presented to the ED on 08/18/22 for shortness of breath. She has not been compliant with her warfarin outpatient. Her symptoms started a few days ago. She was recently switched to Lasix from Bumex and she has felt worse since that change and believes it may not be working as well. She is unsure why she was switched. She has some back pain on the L in the lumbar region tender to palpation. Denies other symptoms. She is currently on 2L [place] which she is not on at home.    ED Course: BNP was 341, Troponin 29, hgb 11.0, PTT 15.4 with INR 1.2, Tox screen positive for cocaine. CXR showed Cardiomegaly with pulmonary edema and small bilateral pleural effusions. ECG showed sinus tachycardia. She was placed on 2L [place] and given lasix for diuresis. She was placed on heparin to bridge to warfarin.  Assessment and Plan: * Acute on chronic CHF exacerbation (based on previous echo results diastolic in nature)  -Updated 2D echo; EF 50%, no wall motion abnormalities; with not significant valvular abnormalities. Mitral valve without regurgitation after replacement.. -Follow daily weights, low-sodium diet and strict intake and output -Diuretics will be transitioned to oral route. -Continue to follow and replete electrolytes as needed -Cessation of recreational habits discussed with patient (underlying history of cocaine abuse). -Patient denies chest pain currently and overall expressed improvement in her breathing.. -Currently not requiring oxygen supplementation -Expressed breathing is back to baseline  History of heart valve replacement with mechanical  valve -Medication noncompliance appreciated; INR subtherapeutic -Continue bridging with Lovenox and Coumadin per pharmacy; goal is for INR 2.5-3.5 -Current INR 2.0.  Tobacco abuse -Cessation counseling encouraged. -Continue nicotine patch.  Depression with anxiety -Stable mood overall -No suicidal ideation or hallucination. -Continue buspar, SSRI, vistaril, trazodone  Essential hypertension -Continue spironolactone, adjusted dose of carvedilol and now oral Lasix. -Heart healthy/low-sodium diet discussed with patient.  Class II obesity -Body mass index is 38.3 kg/m. -Diet, portion control and increase physical activity discussed with patient.  Subjective:  No fever, no chest pain, no nausea, no vomiting.  Reports breathing back to baseline.  INR is still subtherapeutic.  Physical Exam: Vitals:   08/22/22 1356 08/22/22 1933 08/23/22 0412 08/23/22 0521  BP: 111/72 113/73  107/75  Pulse: 98 (!) 117  (!) 114  Resp: 18 19  17   Temp: 98.4 F (36.9 C) 98.4 F (36.9 C)  (!) 97.3 F (36.3 C)  TempSrc: Oral Oral    SpO2: 96% 94%  95%  Weight:   101.2 kg   Height:       General exam: Alert, awake, oriented x 3; denies chest pain and shortness of breath.  Good saturation on room air.  In no acute distress. Respiratory system: Improved air movement bilaterally; no wheezing, no crackles, no using accessory muscle. Cardiovascular system:RRR. No rubs or gallops; no JVD on exam. Gastrointestinal system: Abdomen is nondistended, soft and nontender. No organomegaly or masses felt. Normal bowel sounds heard. Central nervous system: Alert and oriented. No focal neurological deficits. Extremities: No cyanosis or clubbing. Skin: No petechiae. Psychiatry: Judgement and insight appear normal. Mood & affect appropriate.  Data Reviewed: Repeat 2D echo: 1. Left ventricular ejection fraction, by estimation, is 50%. The left  ventricle has low normal function. The left ventricle has no  regional wall  motion abnormalities. There is mild left ventricular hypertrophy. Left  ventricular diastolic parameters are   indeterminate.   2. RV is flattened in systole suggesting RV pressure overload. . Right  ventricular systolic function is mildly reduced. The right ventricular  size is normal. There is normal pulmonary artery systolic pressure.   3. Left atrial size was mildly dilated.   4. The mitral valve has been repaired/replaced. No evidence of mitral  valve regurgitation.   5. 28 mm Edwards mc3 ring annuloplasty TV . The tricuspid valve is has  been repaired/replaced.   6. The aortic valve is tricuspid. There is mild calcification of the  aortic valve. There is mild thickening of the aortic valve. Aortic valve  regurgitation is not visualized. No aortic stenosis is present.   7. The inferior vena cava is normal in size with greater than 50%  respiratory variability, suggesting right atrial pressure of 3 mmHg.   Basic metabolic panel: Sodium 132, potassium 4.3, chloride 98, bicarb 26, BUN 30, creatinine 1.12 and GFR more than 60 CBC: WBCs 8.5, hemoglobin 13.5 and platelet count 360 K INR: 2.0  Family Communication: No family at bedside.  Disposition: Status is: Inpatient Remains inpatient appropriate because: Continue IV diuresis for treatment of acute on chronic heart failure exacerbation.   Planned Discharge Destination: Home  Time spent: 35 minutes  Author: Vassie Loll, MD 08/23/2022 1:45 PM  For on call review [email or web address].

## 2022-08-23 NOTE — Progress Notes (Signed)
ANTICOAGULATION CONSULT NOTE -   Pharmacy Consult for heparin/warfarin Indication:  mechanical heart valve  Allergies  Allergen Reactions   Bee Venom Anaphylaxis   Lisinopril Anaphylaxis, Shortness Of Breath, Swelling and Other (See Comments)    Throat swells   Penicillins Shortness Of Breath, Swelling and Other (See Comments)    Has patient had a PCN reaction causing immediate rash, facial/tongue/throat swelling, SOB or lightheadedness with hypotension: Yes Has patient had a PCN reaction causing severe rash involving mucus membranes or skin necrosis: Yes Has patient had a PCN reaction that required hospitalization No Has patient had a PCN reaction occurring within the last 10 years: No If all of the above answers are "NO", then may proceed with Cephalosporin use.    Perflutren Lipid Microsphere Shortness Of Breath and Other (See Comments)    Chest Pain/Tightness, also   Shellfish Allergy Anaphylaxis   Contrast Media [Iodinated Contrast Media] Hives, Itching and Other (See Comments)    Itching and hives to throat, neck, face and arms.   No difficulty breathing.   This was given at [facility] approximately 2015. Contrast Dye    Patient Measurements: Height: 5\' 4"  (162.6 cm) Weight: 101.2 kg (223 lb 1.7 oz) IBW/kg (Calculated) : 54.7 Heparin Dosing Weight: 78  Vital Signs: Temp: 97.3 F (36.3 C) (12/11 0521) BP: 107/75 (12/11 0521) Pulse Rate: 114 (12/11 0521)  Labs: Recent Labs    08/21/22 0431 08/21/22 0831 08/22/22 0500 08/23/22 0458  HGB 12.4  --  13.0 13.5  HCT 40.4  --  41.6 42.9  PLT 408*  --  386 360  LABPROT 15.4*  --  18.0* 22.8*  INR 1.2  --  1.5* 2.0*  HEPARINUNFRC 0.47  --  0.64 0.95*  CREATININE  --  0.84 0.88 1.12*     Estimated Creatinine Clearance: 75.7 mL/min (A) (by C-G formula based on SCr of 1.12 mg/dL (H)).   Medical History: Past Medical History:  Diagnosis Date   Anxiety    Arthritis    "lower back" (04/04/2018)    Bipolar disorder (HCC)    Chronic bronchitis (HCC)    Chronic diastolic CHF (congestive heart failure) (HCC)    Chronic lower back pain    DDD (degenerative disc disease), lumbar    Depression    Dyspnea    GERD (gastroesophageal reflux disease)    Headache    "weekly" (04/04/2018), miragrains in past (04/03/2019)   Heart murmur    Hypertension    Mitral valve disease    Normocytic anemia    Obesity    Palpitations    Pericarditis    Pneumonia    Premature atrial contractions    Pulmonary hypertension (HCC)    PVC's (premature ventricular contractions)    a. h/o palpitations with event monitor in 03/2017 showing NSR with PACs/PVCs.   Recurrent mitral valve stenosis and regurgitation s/p mitral valve repair    S/P mitral valve repair 03/27/2013   Dr. 03/29/2013 - Darcus Austin, [place] - complex valvuloplasty including resuspension of entire posterior leaflet using Gore-tex neochords and 30 mm Edwards Physio ring annuloplasty   S/P redo mitral valve replacement with mechanical valve 04/05/2019   29 mm Sorin Carbomedics Optiform bileaflet mechanical valve   S/P tricuspid valve repair 04/05/2019   28 mm Edwards mc3 ring annuloplasty   Tobacco abuse    Tricuspid regurgitation      Assessment: 41 year old female with history of mitral/tricuspid valve repair currently with mechanical mitral valve supposed to  be on warfarin prior to admit but not currently due to issues with cost. Orders to start heparin bridge to warfarin.    Last outpatient INR visit was 06/15/21, but records do show warfarin dispensed 07/23/22.   12/8 HL 0.3> 0.5> 0.47> 0.64 > 0.95, supratherapeutic.  INR 1.3> 1.2> 1.5> 2 , subtherapeutic with goal of 2.5 but increasing   Goal of Therapy:  Heparin level 0.3-0.7 units/ml INR 2.5-3.5 Monitor platelets by anticoagulation protocol: Yes   Plan:  Coumadin 7.5mg  po x 1  today Daily PT-INR  Decrease heparin to 1600 units/hr Check anti-Xa level in 6 hours  and  daily. Continue to monitor H&H and platelets.  Elder Cyphers, BS Pharm D, BCPS Clinical Pharmacist 08/23/2022 7:55 AM

## 2022-08-24 DIAGNOSIS — F418 Other specified anxiety disorders: Secondary | ICD-10-CM | POA: Diagnosis not present

## 2022-08-24 DIAGNOSIS — Z720 Tobacco use: Secondary | ICD-10-CM | POA: Diagnosis not present

## 2022-08-24 DIAGNOSIS — I5033 Acute on chronic diastolic (congestive) heart failure: Secondary | ICD-10-CM | POA: Diagnosis not present

## 2022-08-24 DIAGNOSIS — F1910 Other psychoactive substance abuse, uncomplicated: Secondary | ICD-10-CM | POA: Diagnosis not present

## 2022-08-24 LAB — BASIC METABOLIC PANEL
Anion gap: 8 (ref 5–15)
BUN: 32 mg/dL — ABNORMAL HIGH (ref 6–20)
CO2: 26 mmol/L (ref 22–32)
Calcium: 9.4 mg/dL (ref 8.9–10.3)
Chloride: 99 mmol/L (ref 98–111)
Creatinine, Ser: 0.98 mg/dL (ref 0.44–1.00)
GFR, Estimated: >60 mL/min (ref >60)
Glucose, Bld: 142 mg/dL — ABNORMAL HIGH (ref 70–99)
Potassium: 4 mmol/L (ref 3.5–5.1)
Sodium: 133 mmol/L — ABNORMAL LOW (ref 135–145)

## 2022-08-24 LAB — CBC
HCT: 43 % (ref 36.0–46.0)
Hemoglobin: 13.4 g/dL (ref 12.0–15.0)
MCH: 25.3 pg — ABNORMAL LOW (ref 26.0–34.0)
MCHC: 31.2 g/dL (ref 30.0–36.0)
MCV: 81.3 fL (ref 80.0–100.0)
Platelets: 318 10*3/uL (ref 150–400)
RBC: 5.29 MIL/uL — ABNORMAL HIGH (ref 3.87–5.11)
RDW: 17.6 % — ABNORMAL HIGH (ref 11.5–15.5)
WBC: 7.1 10*3/uL (ref 4.0–10.5)
nRBC: 0 % (ref 0.0–0.2)

## 2022-08-24 LAB — PROTIME-INR
INR: 2.4 — ABNORMAL HIGH (ref 0.8–1.2)
Prothrombin Time: 25.5 seconds — ABNORMAL HIGH (ref 11.4–15.2)

## 2022-08-24 MED ORDER — CARVEDILOL 12.5 MG PO TABS: 12.5000 mg | ORAL_TABLET | Freq: Two times a day (BID) | ORAL | 2 refills | Status: DC

## 2022-08-24 MED ORDER — SPIRONOLACTONE 25 MG PO TABS: 25.0000 mg | ORAL_TABLET | Freq: Every morning | ORAL | 3 refills | Status: DC

## 2022-08-24 MED ORDER — WARFARIN SODIUM 5 MG PO TABS: 7.5000 mg | ORAL_TABLET | Freq: Once | ORAL | 2 refills | Status: DC

## 2022-08-24 MED ORDER — FUROSEMIDE 20 MG PO TABS: 60.0000 mg | ORAL_TABLET | Freq: Every day | ORAL | 3 refills | Status: DC

## 2022-08-24 MED ORDER — WARFARIN SODIUM 7.5 MG PO TABS
7.5000 mg | ORAL_TABLET | Freq: Once | ORAL | Status: AC
  Administered 2022-08-24: 7.5 mg via ORAL
  Filled 2022-08-24: qty 1

## 2022-08-24 MED ORDER — NICOTINE 21 MG/24HR TD PT24: 21.0000 mg | MEDICATED_PATCH | Freq: Every day | TRANSDERMAL | 0 refills | Status: DC

## 2022-08-24 MED ORDER — POTASSIUM CHLORIDE ER 20 MEQ PO TBCR: 40.0000 meq | EXTENDED_RELEASE_TABLET | Freq: Every day | ORAL | 1 refills | Status: DC

## 2022-08-24 MED ORDER — HYDROXYZINE PAMOATE 25 MG PO CAPS: 25.0000 mg | ORAL_CAPSULE | Freq: Three times a day (TID) | ORAL | 0 refills | Status: DC | PRN

## 2022-08-24 NOTE — TOC Transition Note (Signed)
Transition of Care [facility]) - CM/SW Discharge Note   Patient Details  Name: Theresa Crawford MRN: [id number] Date of Birth: 1981/01/19  Transition of Care [facility]) CM/SW Contact:  Villa Herb, LCSWA Phone Number: 08/24/2022, 1:08 PM   Clinical Narrative:    CSW spoke to [place] with [facility] who states they can get pt set up for her appointment at the [facility] Office for 12/14 at 11:15am. CSW added this to pts AVS and updated MD. CSW also spoke with pt in room to inform of appointment date and time. TOC signing off.   Final next level of care: Home/Self Care Barriers to Discharge: Barriers Resolved   Patient Goals and CMS Choice Patient states their goals for this hospitalization and ongoing recovery are:: return home CMS Medicare.gov Compare Post Acute Care list provided to:: Patient Choice offered to / list presented to : Patient  Discharge Placement                       Discharge Plan and Services                                     Social Determinants of Health (SDOH) Interventions     Readmission Risk Interventions    01/13/2022    4:12 PM  Readmission Risk Prevention Plan  Transportation Screening Complete  PCP or Specialist Appt within 5-7 Days Complete  Home Care Screening Complete  Medication Review (RN CM) Complete

## 2022-08-24 NOTE — Progress Notes (Signed)
Discharge instructions given on medications,and follow up visits, patient verbalized understanding.Prescriptions sent to Pharmacy of choice documented on AVS. IV discontinued,catheter intact.Accompanied by staff to an awaiting vehicle.

## 2022-08-24 NOTE — Discharge Summary (Signed)
Physician Discharge Summary   Patient: Theresa Crawford MRN: [id number] DOB: Jun 09, 1981  Admit date:     08/18/2022  Discharge date: 08/24/22  Discharge Physician: Vassie Loll   PCP: Patient, No Pcp Per   Recommendations at discharge:  Repeat basic metabolic panel to follow electrolytes and renal function Reassess blood pressure and adjust antihypertensive treatment as needed Continue to assist patient with tobacco and polysubstance abuse cessation. Make sure patient has follow-up with cardiology service as instructed Repeat CBC to follow hemoglobin trend and stability Continue close monitoring of patient's INR and encouraged her to follow-up with Coumadin clinic.  Discharge Diagnoses: Principal Problem:   Acute HFrEF Active Problems:   Essential hypertension   Depression with anxiety   Tobacco abuse   History of heart valve replacement with mechanical valve   Substance abuse (HCC) Class II obesity  Hospital Course: As per H&P written by Dr.Zierle-Ghosh on 08/19/22 Theresa Crawford is a 41 y.o. female with a history of CHF, mitral valve replacement, tricuspid repair, chronic anemia, and depression/anxiety who presented to the ED on 08/18/22 for shortness of breath. She has not been compliant with her warfarin outpatient. Her symptoms started a few days ago. She was recently switched to Lasix from Bumex and she has felt worse since that change and believes it may not be working as well. She is unsure why she was switched. She has some back pain on the L in the lumbar region tender to palpation. Denies other symptoms. She is currently on 2L [place] which she is not on at home.    ED Course: BNP was 341, Troponin 29, hgb 11.0, PTT 15.4 with INR 1.2, Tox screen positive for cocaine. CXR showed Cardiomegaly with pulmonary edema and small bilateral pleural effusions. ECG showed sinus tachycardia. She was placed on 2L [place] and given lasix for diuresis. She was placed on heparin to bridge to  warfarin.  Assessment and Plan: * Acute on chronic CHF exacerbation (based on previous echo results diastolic in nature)  -Updated 2D echo; EF 50%, no wall motion abnormalities; with not significant valvular abnormalities. Mitral valve without regurgitation after replacement.. -Follow daily weights, low-sodium diet and strict intake and output -Diuretics will be transitioned to oral route. -Continue to follow and replete electrolytes as needed -Cessation of recreational habits discussed with patient (underlying history of cocaine abuse). -Patient denies chest pain currently and overall expressed improvement in her breathing.. -Currently not requiring oxygen supplementation -Expressed breathing is back to baseline   History of heart valve replacement with mechanical valve -Medication noncompliance appreciated; INR subtherapeutic at time of admission (1.3) -INR goal is for INR 2.5-3.5 -Current INR 2.4. -Bridging with Lovenox as prior pharmacy recommendations provided -Outpatient follow-up for close monitoring with Coumadin clinic arranged.   Tobacco abuse -Cessation counseling encouraged. -Continue nicotine patch.   Depression with anxiety -Stable mood overall -No suicidal ideation or hallucination. -Continue buspar, SSRI, vistaril and trazodone   Essential hypertension -Continue spironolactone, adjusted dose of carvedilol and now oral Lasix. -Heart healthy/low-sodium diet discussed with patient.   Class II obesity -Body mass index is 38.3 kg/m. -Diet, portion control and increase physical activity discussed with patient.  Consultants: Cardiology curbside Procedures performed: See below for x-ray reports Disposition: Home Diet recommendation: Heart healthy and low calorie diet.  DISCHARGE MEDICATION: Allergies as of 08/24/2022       Reactions   Bee Venom Anaphylaxis   Lisinopril Anaphylaxis, Shortness Of Breath, Swelling, Other (See Comments)   Throat swells  Penicillins Shortness Of Breath, Swelling, Other (See Comments)   Has patient had a PCN reaction causing immediate rash, facial/tongue/throat swelling, SOB or lightheadedness with hypotension: Yes Has patient had a PCN reaction causing severe rash involving mucus membranes or skin necrosis: Yes Has patient had a PCN reaction that required hospitalization No Has patient had a PCN reaction occurring within the last 10 years: No If all of the above answers are "NO", then may proceed with Cephalosporin use.   Perflutren Lipid Microsphere Shortness Of Breath, Other (See Comments)   Chest Pain/Tightness, also   Shellfish Allergy Anaphylaxis   Contrast Media [iodinated Contrast Media] Hives, Itching, Other (See Comments)   Itching and hives to throat, neck, face and arms.   No difficulty breathing.   This was given at [facility] approximately 2015. Contrast Dye        Medication List     STOP taking these medications    metoprolol succinate 25 MG 24 hr tablet Commonly known as: TOPROL-XL       TAKE these medications    acetaminophen 325 MG tablet Commonly known as: TYLENOL Take 2 tablets (650 mg total) by mouth every 6 (six) hours as needed for mild pain.   albuterol 108 (90 Base) MCG/ACT inhaler Commonly known as: VENTOLIN HFA Inhale 2 puffs into the lungs every 6 (six) hours as needed for wheezing or shortness of breath.   busPIRone 7.5 MG tablet Commonly known as: BUSPAR Take 1 tablet (7.5 mg total) by mouth 3 (three) times daily.   carvedilol 12.5 MG tablet Commonly known as: COREG Take 1 tablet (12.5 mg total) by mouth 2 (two) times daily with a meal.   cyanocobalamin 1000 MCG tablet Commonly known as: VITAMIN B12 Take 1,000 mcg by mouth daily.   ferrous sulfate 325 (65 FE) MG tablet Take 1 tablet (325 mg total) by mouth daily with breakfast.   folic acid 1 MG tablet Commonly known as: FOLVITE Take 1 tablet (1 mg total) by mouth daily.   furosemide  20 MG tablet Commonly known as: LASIX Take 3 tablets (60 mg total) by mouth daily. Start taking on: August 25, 2022 What changed:  medication strength how much to take   hydrOXYzine 25 MG capsule Commonly known as: VISTARIL Take 1 capsule (25 mg total) by mouth every 8 (eight) hours as needed for anxiety or itching.   nicotine 21 mg/24hr patch Commonly known as: NICODERM CQ - dosed in mg/24 hours Place 1 patch (21 mg total) onto the skin daily. Start taking on: August 25, 2022   Potassium Chloride ER 20 MEQ Tbcr Take 40 mEq by mouth daily. Take While taking Lasix/furosemide What changed:  medication strength how much to take   sertraline 50 MG tablet Commonly known as: Zoloft Take 1 tablet (50 mg total) by mouth daily.   spironolactone 25 MG tablet Commonly known as: ALDACTONE Take 1 tablet (25 mg total) by mouth in the morning.   traZODone 50 MG tablet Commonly known as: DESYREL Take 1 tablet (50 mg total) by mouth at bedtime. For sleep and mood   warfarin 5 MG tablet Commonly known as: COUMADIN Take as directed. If you are unsure how to take this medication, talk to your nurse or doctor. Original instructions: Take 1.5 tablets (7.5 mg total) by mouth one time only at 4 PM. What changed:  how much to take when to take this additional instructions        Follow-up Information  [facility] Follow up.   Why: Appointment is Thursday 12/14 at 11:15 AM. Contact information: [address] [place] [address] [phone]         Antoine Poche, MD. Schedule an appointment as soon as possible for a visit in 2 week(s).   Specialty: Cardiology Contact information: [address] [place] [address] [phone]                Discharge Exam: Ceasar Mons Weights   08/22/22 0500 08/23/22 0412 08/24/22 5284  Weight: 101.3 kg 101.2 kg 103.5 kg   General exam: Alert, awake, oriented x 3; denies chest pain and  shortness of breath.  Good saturation on room air. And in not acute distress. Respiratory system: Improved air movement bilaterally; no wheezing, no crackles, no using accessory muscle. Cardiovascular system:RRR. No rubs or gallops; no JVD on exam. Gastrointestinal system: Abdomen is nondistended, soft and nontender. No organomegaly or masses felt. Normal bowel sounds heard. Central nervous system: Alert and oriented. No focal neurological deficits. Extremities: No cyanosis or clubbing.  No lower extremity edema appreciated. Skin: No petechiae. Psychiatry: Judgement and insight appear normal. Mood & affect appropriate.   Condition at discharge: Stable and improved.  The results of significant diagnostics from this hospitalization (including imaging, microbiology, ancillary and laboratory) are listed below for reference.   Imaging Studies: ECHOCARDIOGRAM COMPLETE  Result Date: 08/19/2022    ECHOCARDIOGRAM REPORT   Patient Name:   Theresa Crawford Date of Exam: 08/19/2022 Medical Rec #:  [id number]       Height:       64.0 in Accession #:    [id number]      Weight:       226.2 lb Date of Birth:  07-10-1981       BSA:          2.061 m Patient Age:    41 years        BP:           124/81 mmHg Patient Gender: F               HR:           117 bpm. [name] Procedure: 2D Echo, Cardiac Doppler and Color Doppler Indications:    CHF  History:        Patient has prior history of Echocardiogram examinations, most                 recent 01/13/2022. CHF, Arrythmias:Atrial Flutter,                 Signs/Symptoms:Shortness of Breath and Dyspnea; Risk                 Factors:Hypertension and Current Smoker. Substance abuse                 There is a 28mm Edwards MC3 ring in the Tricuspid valve                 position.                 There is a 29mm Sorin carbomedics optiform bileaflet mechanical                 valve in the Mitral position.  Sonographer:    Mikki Harbor Referring Phys: [id number]  ASIA B ZIERLE-GHOSH IMPRESSIONS  1. Left ventricular ejection fraction, by estimation, is 50%. The left ventricle has low  normal function. The left ventricle has no regional wall motion abnormalities. There is mild left ventricular hypertrophy. Left ventricular diastolic parameters are  indeterminate.  2. RV is flattened in systole suggesting RV pressure overload. . Right ventricular systolic function is mildly reduced. The right ventricular size is normal. There is normal pulmonary artery systolic pressure.  3. Left atrial size was mildly dilated.  4. The mitral valve has been repaired/replaced. No evidence of mitral valve regurgitation.  5. 28 mm Edwards mc3 ring annuloplasty TV . The tricuspid valve is has been repaired/replaced.  6. The aortic valve is tricuspid. There is mild calcification of the aortic valve. There is mild thickening of the aortic valve. Aortic valve regurgitation is not visualized. No aortic stenosis is present.  7. The inferior vena cava is normal in size with greater than 50% respiratory variability, suggesting right atrial pressure of 3 mmHg. FINDINGS  Left Ventricle: Left ventricular ejection fraction, by estimation, is 50%. The left ventricle has low normal function. The left ventricle has no regional wall motion abnormalities. The left ventricular internal cavity size was normal in size. There is mild left ventricular hypertrophy. Left ventricular diastolic parameters are indeterminate. Right Ventricle: RV is flattened in systole suggesting RV pressure overload. The right ventricular size is normal. Right vetricular wall thickness was not well visualized. Right ventricular systolic function is mildly reduced. There is normal pulmonary artery systolic pressure. The tricuspid regurgitant velocity is 2.47 m/s, and with an assumed right atrial pressure of 8 mmHg, the estimated right ventricular systolic pressure is 32.4 mmHg. Left Atrium: Left atrial size was mildly dilated. Right Atrium:  Right atrial size was normal in size. Pericardium: There is no evidence of pericardial effusion. Mitral Valve: Sorin Carbomedics Optiform bileaflet mechanical valve size 62mm is in the MV position. Moderate mean gradient across the valve of 8 mmHg. The mitral valve has been repaired/replaced. No evidence of mitral valve regurgitation. MV peak gradient, 19.0 mmHg. The mean mitral valve gradient is 9.0 mmHg. Tricuspid Valve: 28 mm Edwards mc3 ring annuloplasty TV. The tricuspid valve is has been repaired/replaced. Tricuspid valve regurgitation is trivial. No evidence of tricuspid stenosis. Aortic Valve: The aortic valve is tricuspid. There is mild calcification of the aortic valve. There is mild thickening of the aortic valve. There is mild aortic valve annular calcification. Aortic valve regurgitation is not visualized. No aortic stenosis  is present. Aortic valve mean gradient measures 4.0 mmHg. Aortic valve peak gradient measures 7.3 mmHg. Aortic valve area, by VTI measures 2.75 cm. Pulmonic Valve: The pulmonic valve was not well visualized. Pulmonic valve regurgitation is not visualized. No evidence of pulmonic stenosis. Aorta: The aortic root is normal in size and structure. Venous: The inferior vena cava is normal in size with greater than 50% respiratory variability, suggesting right atrial pressure of 3 mmHg. IAS/Shunts: The interatrial septum was not well visualized.  LEFT VENTRICLE PLAX 2D LVIDd:         5.10 cm LVIDs:         3.90 cm LV PW:         1.20 cm LV IVS:        1.20 cm LVOT diam:     2.00 cm LV SV:         72 LV SV Index:   35 LVOT Area:     3.14 cm  LV Volumes (MOD) LV vol d, MOD A2C: 100.0 ml LV vol d, MOD A4C: 71.5 ml LV vol s, MOD A2C: 51.0 ml  LV vol s, MOD A4C: 39.3 ml LV SV MOD A2C:     49.0 ml LV SV MOD A4C:     71.5 ml LV SV MOD BP:      38.2 ml RIGHT VENTRICLE RV Basal diam:  3.90 cm RV Mid diam:    3.40 cm RV S prime:     6.53 cm/s LEFT ATRIUM             Index        RIGHT ATRIUM            Index LA diam:        5.50 cm 2.67 cm/m   RA Area:     18.30 cm LA Vol (A2C):   81.2 ml 39.39 ml/m  RA Volume:   60.00 ml  29.11 ml/m LA Vol (A4C):   82.5 ml 40.02 ml/m LA Biplane Vol: 80.9 ml 39.24 ml/m  AORTIC VALVE                    PULMONIC VALVE AV Area (Vmax):    2.72 cm     PV Vmax:       1.08 m/s AV Area (Vmean):   2.71 cm     PV Peak grad:  4.7 mmHg AV Area (VTI):     2.75 cm AV Vmax:           135.00 cm/s AV Vmean:          86.900 cm/s AV VTI:            0.262 m AV Peak Grad:      7.3 mmHg AV Mean Grad:      4.0 mmHg LVOT Vmax:         117.00 cm/s LVOT Vmean:        75.000 cm/s LVOT VTI:          0.229 m LVOT/AV VTI ratio: 0.87  AORTA Ao Root diam: 2.90 cm MITRAL VALVE                TRICUSPID VALVE MV Area (PHT): 5.02 cm     TR Peak grad:   24.4 mmHg MV Area VTI:   2.81 cm     TR Vmax:        247.00 cm/s MV Peak grad:  19.0 mmHg MV Mean grad:  9.0 mmHg     SHUNTS MV Vmax:       2.18 m/s     Systemic VTI:  0.23 m MV Vmean:      138.0 cm/s   Systemic Diam: 2.00 cm MV Decel Time: 151 msec MV E velocity: 200.00 cm/s Dina Rich MD Electronically signed by Dina Rich MD Signature Date/Time: 08/19/2022/2:23:11 PM    Final    DG Chest 2 View  Result Date: 08/18/2022 CLINICAL DATA:  Shortness of breath, history of CHF. EXAM: CHEST - 2 VIEW COMPARISON:  Chest radiograph dated Jan 14, 2022 FINDINGS: The heart is enlarged. Sternotomy wires and prior carotid valve replacement. Bilateral perihilar opacities suggesting pulmonary edema. Small bilateral pleural effusions. No acute osseous abnormality. IMPRESSION: Cardiomegaly with pulmonary edema and small bilateral pleural effusions. Electronically Signed   By: Larose Hires D.O.   On: 08/18/2022 21:44    Microbiology: Results for orders placed or performed during the hospital encounter of 08/18/22  Resp Panel by RT-PCR (Flu A&B, Covid) Anterior Nasal Swab     Status: None   Collection Time: 08/18/22  9:07 PM   Specimen:  Anterior Nasal  Swab  Result Value Ref Range Status   SARS Coronavirus 2 by RT PCR NEGATIVE NEGATIVE Final    Comment: (NOTE) SARS-CoV-2 target nucleic acids are NOT DETECTED.  The SARS-CoV-2 RNA is generally detectable in upper respiratory specimens during the acute phase of infection. The lowest concentration of SARS-CoV-2 viral copies this assay can detect is 138 copies/mL. A negative result does not preclude SARS-Cov-2 infection and should not be used as the sole basis for treatment or other patient management decisions. A negative result may occur with  improper specimen collection/handling, submission of specimen other than nasopharyngeal swab, presence of viral mutation(s) within the areas targeted by this assay, and inadequate number of viral copies(<138 copies/mL). A negative result must be combined with clinical observations, patient history, and epidemiological information. The expected result is Negative.  Fact Sheet for Patients:  BloggerCourse.com  Fact Sheet for Healthcare Providers:  SeriousBroker.it  This test is no t yet approved or cleared by the [place] FDA and  has been authorized for detection and/or diagnosis of SARS-CoV-2 by FDA under an Emergency Use Authorization (EUA). This EUA will remain  in effect (meaning this test can be used) for the duration of the COVID-19 declaration under Section 564(b)(1) of the Act, 21 U.S.C.section 360bbb-3(b)(1), unless the authorization is terminated  or revoked sooner.       Influenza A by PCR NEGATIVE NEGATIVE Final   Influenza B by PCR NEGATIVE NEGATIVE Final    Comment: (NOTE) The Xpert Xpress SARS-CoV-2/FLU/RSV plus assay is intended as an aid in the diagnosis of influenza from Nasopharyngeal swab specimens and should not be used as a sole basis for treatment. Nasal washings and aspirates are unacceptable for Xpert Xpress SARS-CoV-2/FLU/RSV testing.  Fact Sheet for  Patients: BloggerCourse.com  Fact Sheet for Healthcare Providers: SeriousBroker.it  This test is not yet approved or cleared by the [place] FDA and has been authorized for detection and/or diagnosis of SARS-CoV-2 by FDA under an Emergency Use Authorization (EUA). This EUA will remain in effect (meaning this test can be used) for the duration of the COVID-19 declaration under Section 564(b)(1) of the Act, 21 U.S.C. section 360bbb-3(b)(1), unless the authorization is terminated or revoked.  Performed at [facility], [address], [place], [place] [address]     Labs: CBC: Recent Labs  Lab 08/18/22 2125 08/19/22 0447 08/19/22 2146 08/20/22 0518 08/21/22 0431 08/22/22 0500 08/23/22 0458 08/24/22 0353  WBC 7.4 7.8  --  8.5 9.0 10.6* 8.5 7.1  NEUTROABS 5.0 4.7  --   --   --   --   --   --   HGB 11.0* 10.9*   < > 11.0* 12.4 13.0 13.5 13.4  HCT 35.2* 34.4*   < > 34.9* 40.4 41.6 42.9 43.0  MCV 81.3 81.5  --  81.7 82.6 80.8 80.3 81.3  PLT 322 334  --  356 408* 386 360 318   < > = values in this interval not displayed.   Basic Metabolic Panel: Recent Labs  Lab 08/19/22 0447 08/21/22 0831 08/22/22 0500 08/23/22 0458 08/24/22 0353  NA 138 136 133* 132* 133*  K 3.3* 4.2 4.1 4.3 4.0  CL 106 104 102 98 99  CO2 26 26 24 26 26   GLUCOSE 98 119* 117* 147* 142*  BUN 12 16 21* 30* 32*  CREATININE 0.75 0.84 0.88 1.12* 0.98  CALCIUM 8.3* 9.0 9.3 9.2 9.4  MG 2.1  --   --   --   --  Liver Function Tests: Recent Labs  Lab 08/18/22 2125 08/19/22 0447  AST 25 23  ALT 23 21  ALKPHOS 65 64  BILITOT 0.7 0.6  PROT 7.0 6.7  ALBUMIN 3.4* 3.3*   CBG: No results for input(s): "GLUCAP" in the last 168 hours.  Discharge time spent: greater than 30 minutes.  Signed: Vassie Lollarlos Aissata Wilmore, MD [facility] 08/24/2022

## 2022-08-24 NOTE — Progress Notes (Signed)
ANTICOAGULATION CONSULT NOTE -   Pharmacy Consult for enoxaparin/warfarin Indication:  mechanical heart valve  Allergies  Allergen Reactions   Bee Venom Anaphylaxis   Lisinopril Anaphylaxis, Shortness Of Breath, Swelling and Other (See Comments)    Throat swells   Penicillins Shortness Of Breath, Swelling and Other (See Comments)    Has patient had a PCN reaction causing immediate rash, facial/tongue/throat swelling, SOB or lightheadedness with hypotension: Yes Has patient had a PCN reaction causing severe rash involving mucus membranes or skin necrosis: Yes Has patient had a PCN reaction that required hospitalization No Has patient had a PCN reaction occurring within the last 10 years: No If all of the above answers are "NO", then may proceed with Cephalosporin use.    Perflutren Lipid Microsphere Shortness Of Breath and Other (See Comments)    Chest Pain/Tightness, also   Shellfish Allergy Anaphylaxis   Contrast Media [Iodinated Contrast Media] Hives, Itching and Other (See Comments)    Itching and hives to throat, neck, face and arms.   No difficulty breathing.   This was given at [facility] approximately 2015. Contrast Dye    Patient Measurements: Height: 5\' 4"  (162.6 cm) Weight: 103.5 kg (228 lb 2.8 oz) IBW/kg (Calculated) : 54.7 Heparin Dosing Weight: 78  Vital Signs: Temp: 98 F (36.7 C) (12/12 0916) Temp Source: Oral (12/12 0916) BP: 117/74 (12/12 0916) Pulse Rate: 110 (12/12 0916)  Labs: Recent Labs    08/22/22 0500 08/23/22 0458 08/24/22 0353  HGB 13.0 13.5 13.4  HCT 41.6 42.9 43.0  PLT 386 360 318  LABPROT 18.0* 22.8* 25.5*  INR 1.5* 2.0* 2.4*  HEPARINUNFRC 0.64 0.95*  --   CREATININE 0.88 1.12* 0.98     Estimated Creatinine Clearance: 87.6 mL/min (by C-G formula based on SCr of 0.98 mg/dL).   Medical History: Past Medical History:  Diagnosis Date   Anxiety    Arthritis    "lower back" (04/04/2018)   Bipolar disorder (HCC)     Chronic bronchitis (HCC)    Chronic diastolic CHF (congestive heart failure) (HCC)    Chronic lower back pain    DDD (degenerative disc disease), lumbar    Depression    Dyspnea    GERD (gastroesophageal reflux disease)    Headache    "weekly" (04/04/2018), miragrains in past (04/03/2019)   Heart murmur    Hypertension    Mitral valve disease    Normocytic anemia    Obesity    Palpitations    Pericarditis    Pneumonia    Premature atrial contractions    Pulmonary hypertension (HCC)    PVC's (premature ventricular contractions)    a. h/o palpitations with event monitor in 03/2017 showing NSR with PACs/PVCs.   Recurrent mitral valve stenosis and regurgitation s/p mitral valve repair    S/P mitral valve repair 03/27/2013   Dr. 03/29/2013 - Darcus Austin, [place] - complex valvuloplasty including resuspension of entire posterior leaflet using Gore-tex neochords and 30 mm Edwards Physio ring annuloplasty   S/P redo mitral valve replacement with mechanical valve 04/05/2019   29 mm Sorin Carbomedics Optiform bileaflet mechanical valve   S/P tricuspid valve repair 04/05/2019   28 mm Edwards mc3 ring annuloplasty   Tobacco abuse    Tricuspid regurgitation      Assessment: 41 year old female with history of mitral/tricuspid valve repair currently with mechanical mitral valve supposed to be on warfarin prior to admit but not currently due to issues with cost. Orders to start heparin  bridge to warfarin.    Last outpatient INR visit was 06/15/21, but records do show warfarin dispensed 07/23/22.   12/11 MD asked to transition to lovenox for bridge until INR at goal  INR 1.3> 1.2> 1.5> 2> 2.4 , subtherapeutic with goal of 2.5 but increasing   Goal of Therapy:  INR 2.5-3.5 Monitor platelets by anticoagulation protocol: Yes   Plan:  Lovenox 100 mg subq every 12 hours until INR reaches 2.5 Coumadin 7.5mg  po x 1  today Daily PT-INR Continue to monitor H&H and platelets.  Judeth Cornfield,  PharmD Clinical Pharmacist 08/24/2022 10:45 AM

## 2022-08-26 ENCOUNTER — Telehealth (INDEPENDENT_AMBULATORY_CARE_PROVIDER_SITE_OTHER): Payer: Self-pay

## 2022-08-26 NOTE — Telephone Encounter (Signed)
Transition Care Management Follow-up Telephone Call Date of discharge and from where: [name]22 How have you been since you were released from the hospital? good Any questions or concerns? No  Items Reviewed: Did the pt receive and understand the discharge instructions provided? Yes  Medications obtained and verified? Yes  Other? No  Any new allergies since your discharge? No  Dietary orders reviewed? Yes Do you have support at home? Yes   Home Care and Equipment/Supplies: Were home health services ordered? no If so, what is the name of the agency? N/a  Has the agency set up a time to come to the patient's home? not applicable Were any new equipment or medical supplies ordered?  No What is the name of the medical supply agency? N/a Were you able to get the supplies/equipment? not applicable Do you have any questions related to the use of the equipment or supplies? No  Functional Questionnaire: (I = Independent and D = Dependent) ADLs: I  Bathing/Dressing- I  Meal Prep- I  Eating- I  Maintaining continence- I  Transferring/Ambulation- I  Managing Meds- I  Follow up appointments reviewed:  PCP Hospital f/u appt confirmed? Yes  Scheduled to see Gwinda Passe on 09/10/2022 @ 9:30. Specialist Hospital f/u appt confirmed? Yes  Scheduled to see Dr Wyline Mood patient not sure of date and time. She will check her paperwork Are transportation arrangements needed? Yes  If their condition worsens, is the pt aware to call PCP or go to the Emergency Dept.? Yes Was the patient provided with contact information for the PCP's office or ED? Yes Was to pt encouraged to call back with questions or concerns? Yes Karena Addison, LPN [facility] Nurse Health Advisor Direct Dial [phone]

## 2022-09-10 ENCOUNTER — Inpatient Hospital Stay (INDEPENDENT_AMBULATORY_CARE_PROVIDER_SITE_OTHER): Payer: 59 | Admitting: Primary Care

## 2023-04-07 ENCOUNTER — Emergency Department (HOSPITAL_COMMUNITY): Payer: MEDICAID

## 2023-04-07 ENCOUNTER — Encounter (HOSPITAL_COMMUNITY): Payer: Self-pay

## 2023-04-07 ENCOUNTER — Inpatient Hospital Stay (HOSPITAL_COMMUNITY)
Admission: EM | Admit: 2023-04-07 | Discharge: 2023-04-10 | DRG: 917 | Disposition: A | Discharge status: Home / Self Care | Payer: MEDICAID | Attending: Family Medicine | Admitting: Family Medicine

## 2023-04-07 ENCOUNTER — Inpatient Hospital Stay (HOSPITAL_COMMUNITY): Payer: MEDICAID

## 2023-04-07 ENCOUNTER — Other Ambulatory Visit: Payer: Self-pay

## 2023-04-07 DIAGNOSIS — Z952 Presence of prosthetic heart valve: Secondary | ICD-10-CM | POA: Diagnosis not present

## 2023-04-07 DIAGNOSIS — Z91030 Bee allergy status: Secondary | ICD-10-CM

## 2023-04-07 DIAGNOSIS — Z8249 Family history of ischemic heart disease and other diseases of the circulatory system: Secondary | ICD-10-CM

## 2023-04-07 DIAGNOSIS — F419 Anxiety disorder, unspecified: Secondary | ICD-10-CM | POA: Diagnosis present

## 2023-04-07 DIAGNOSIS — R791 Abnormal coagulation profile: Secondary | ICD-10-CM | POA: Diagnosis present

## 2023-04-07 DIAGNOSIS — Z91148 Patient's other noncompliance with medication regimen for other reason: Secondary | ICD-10-CM

## 2023-04-07 DIAGNOSIS — M5136 Other intervertebral disc degeneration, lumbar region: Secondary | ICD-10-CM | POA: Diagnosis present

## 2023-04-07 DIAGNOSIS — I2510 Atherosclerotic heart disease of native coronary artery without angina pectoris: Secondary | ICD-10-CM | POA: Diagnosis present

## 2023-04-07 DIAGNOSIS — I5023 Acute on chronic systolic (congestive) heart failure: Secondary | ICD-10-CM | POA: Diagnosis not present

## 2023-04-07 DIAGNOSIS — T405X1A Poisoning by cocaine, accidental (unintentional), initial encounter: Principal | ICD-10-CM | POA: Diagnosis present

## 2023-04-07 DIAGNOSIS — R0602 Shortness of breath: Secondary | ICD-10-CM | POA: Diagnosis present

## 2023-04-07 DIAGNOSIS — F319 Bipolar disorder, unspecified: Secondary | ICD-10-CM | POA: Diagnosis present

## 2023-04-07 DIAGNOSIS — J9601 Acute respiratory failure with hypoxia: Secondary | ICD-10-CM | POA: Diagnosis present

## 2023-04-07 DIAGNOSIS — I4892 Unspecified atrial flutter: Secondary | ICD-10-CM | POA: Diagnosis present

## 2023-04-07 DIAGNOSIS — I5033 Acute on chronic diastolic (congestive) heart failure: Secondary | ICD-10-CM | POA: Diagnosis not present

## 2023-04-07 DIAGNOSIS — F418 Other specified anxiety disorders: Secondary | ICD-10-CM | POA: Diagnosis not present

## 2023-04-07 DIAGNOSIS — K219 Gastro-esophageal reflux disease without esophagitis: Secondary | ICD-10-CM | POA: Diagnosis present

## 2023-04-07 DIAGNOSIS — I272 Pulmonary hypertension, unspecified: Secondary | ICD-10-CM | POA: Diagnosis present

## 2023-04-07 DIAGNOSIS — Z6841 Body Mass Index (BMI) 40.0 and over, adult: Secondary | ICD-10-CM

## 2023-04-07 DIAGNOSIS — F1410 Cocaine abuse, uncomplicated: Secondary | ICD-10-CM | POA: Diagnosis present

## 2023-04-07 DIAGNOSIS — Z90711 Acquired absence of uterus with remaining cervical stump: Secondary | ICD-10-CM

## 2023-04-07 DIAGNOSIS — F1910 Other psychoactive substance abuse, uncomplicated: Secondary | ICD-10-CM

## 2023-04-07 DIAGNOSIS — E669 Obesity, unspecified: Secondary | ICD-10-CM

## 2023-04-07 DIAGNOSIS — Z880 Allergy status to penicillin: Secondary | ICD-10-CM | POA: Diagnosis not present

## 2023-04-07 DIAGNOSIS — Z888 Allergy status to other drugs, medicaments and biological substances status: Secondary | ICD-10-CM

## 2023-04-07 DIAGNOSIS — Z79899 Other long term (current) drug therapy: Secondary | ICD-10-CM | POA: Diagnosis not present

## 2023-04-07 DIAGNOSIS — Z720 Tobacco use: Secondary | ICD-10-CM | POA: Diagnosis not present

## 2023-04-07 DIAGNOSIS — J42 Unspecified chronic bronchitis: Secondary | ICD-10-CM | POA: Diagnosis present

## 2023-04-07 DIAGNOSIS — Z7982 Long term (current) use of aspirin: Secondary | ICD-10-CM

## 2023-04-07 DIAGNOSIS — I5021 Acute systolic (congestive) heart failure: Secondary | ICD-10-CM | POA: Diagnosis not present

## 2023-04-07 DIAGNOSIS — I5043 Acute on chronic combined systolic (congestive) and diastolic (congestive) heart failure: Secondary | ICD-10-CM | POA: Diagnosis present

## 2023-04-07 DIAGNOSIS — F17210 Nicotine dependence, cigarettes, uncomplicated: Secondary | ICD-10-CM | POA: Diagnosis present

## 2023-04-07 DIAGNOSIS — I110 Hypertensive heart disease with heart failure: Secondary | ICD-10-CM | POA: Diagnosis present

## 2023-04-07 DIAGNOSIS — Z7901 Long term (current) use of anticoagulants: Secondary | ICD-10-CM

## 2023-04-07 DIAGNOSIS — M479 Spondylosis, unspecified: Secondary | ICD-10-CM | POA: Diagnosis present

## 2023-04-07 DIAGNOSIS — I440 Atrioventricular block, first degree: Secondary | ICD-10-CM | POA: Diagnosis present

## 2023-04-07 DIAGNOSIS — Z90721 Acquired absence of ovaries, unilateral: Secondary | ICD-10-CM

## 2023-04-07 LAB — COMPREHENSIVE METABOLIC PANEL
ALT: 14 U/L (ref 0–44)
AST: 19 U/L (ref 15–41)
Albumin: 3.7 g/dL (ref 3.5–5.0)
Alkaline Phosphatase: 50 U/L (ref 38–126)
Anion gap: 6 (ref 5–15)
BUN: 16 mg/dL (ref 6–20)
CO2: 22 mmol/L (ref 22–32)
Calcium: 8.7 mg/dL — ABNORMAL LOW (ref 8.9–10.3)
Chloride: 109 mmol/L (ref 98–111)
Creatinine, Ser: 0.78 mg/dL (ref 0.44–1.00)
GFR, Estimated: >60 mL/min (ref >60)
Glucose, Bld: 97 mg/dL (ref 70–99)
Potassium: 3.9 mmol/L (ref 3.5–5.1)
Sodium: 137 mmol/L (ref 135–145)
Total Bilirubin: 0.4 mg/dL (ref 0.3–1.2)
Total Protein: 7.8 g/dL (ref 6.5–8.1)

## 2023-04-07 LAB — ECHOCARDIOGRAM COMPLETE
AR max vel: 1.86 cm2
AV Area VTI: 1.5 cm2
AV Area mean vel: 2.27 cm2
AV Mean grad: 3.4 mmHg
AV Peak grad: 8 mmHg
Ao pk vel: 1.42 m/s
Area-P 1/2: 2.84 cm2
Height: 64 in
MV M vel: 1.9 m/s
MV Peak grad: 14.4 mmHg
MV VTI: 0.74 cm2
S' Lateral: 3 cm
Weight: 3661.4 oz

## 2023-04-07 LAB — RAPID URINE DRUG SCREEN, HOSP PERFORMED
Amphetamines: NOT DETECTED
Barbiturates: NOT DETECTED
Benzodiazepines: NOT DETECTED
Cocaine: POSITIVE — AB
Opiates: NOT DETECTED
Tetrahydrocannabinol: NOT DETECTED

## 2023-04-07 LAB — CBC
HCT: 37.9 % (ref 36.0–46.0)
Hemoglobin: 12 g/dL (ref 12.0–15.0)
MCH: 26.4 pg (ref 26.0–34.0)
MCHC: 31.7 g/dL (ref 30.0–36.0)
MCV: 83.5 fL (ref 80.0–100.0)
Platelets: 316 10*3/uL (ref 150–400)
RBC: 4.54 MIL/uL (ref 3.87–5.11)
RDW: 17.3 % — ABNORMAL HIGH (ref 11.5–15.5)
WBC: 10.2 10*3/uL (ref 4.0–10.5)
nRBC: 0 % (ref 0.0–0.2)

## 2023-04-07 LAB — PROTIME-INR
INR: 1.1 (ref 0.8–1.2)
INR: 1.1 (ref 0.8–1.2)
Prothrombin Time: 14.4 seconds (ref 11.4–15.2)
Prothrombin Time: 14.5 seconds (ref 11.4–15.2)

## 2023-04-07 LAB — TROPONIN I (HIGH SENSITIVITY)
Troponin I (High Sensitivity): 11 ng/L (ref <18)
Troponin I (High Sensitivity): 10 ng/L (ref <18)

## 2023-04-07 LAB — HIV ANTIBODY (ROUTINE TESTING W REFLEX): HIV Screen 4th Generation wRfx: NONREACTIVE

## 2023-04-07 LAB — BRAIN NATRIURETIC PEPTIDE: B Natriuretic Peptide: 285 pg/mL — ABNORMAL HIGH (ref 0.0–100.0)

## 2023-04-07 LAB — MRSA NEXT GEN BY PCR, NASAL: MRSA by PCR Next Gen: NOT DETECTED

## 2023-04-07 LAB — PHOSPHORUS: Phosphorus: 3.5 mg/dL (ref 2.5–4.6)

## 2023-04-07 LAB — MAGNESIUM: Magnesium: 1.9 mg/dL (ref 1.7–2.4)

## 2023-04-07 MED ORDER — HYDROMORPHONE HCL 1 MG/ML IJ SOLN
0.5000 mg | Freq: Once | INTRAMUSCULAR | Status: AC
  Administered 2023-04-07: 0.5 mg via INTRAVENOUS
  Filled 2023-04-07: qty 0.5

## 2023-04-07 MED ORDER — ENOXAPARIN SODIUM 100 MG/ML IJ SOSY
100.0000 mg | PREFILLED_SYRINGE | Freq: Two times a day (BID) | INTRAMUSCULAR | Status: DC
  Administered 2023-04-07 – 2023-04-10 (×7): 100 mg via SUBCUTANEOUS
  Filled 2023-04-07 (×7): qty 1

## 2023-04-07 MED ORDER — WARFARIN SODIUM 7.5 MG PO TABS: 7.5000 mg | ORAL_TABLET | Freq: Once | ORAL | Status: DC

## 2023-04-07 MED ORDER — ORAL CARE MOUTH RINSE
15.0000 mL | OROMUCOSAL | Status: DC
  Administered 2023-04-07: 15 mL via OROMUCOSAL

## 2023-04-07 MED ORDER — NICOTINE 21 MG/24HR TD PT24
21.0000 mg | MEDICATED_PATCH | Freq: Every day | TRANSDERMAL | Status: DC
  Administered 2023-04-07 – 2023-04-10 (×4): 21 mg via TRANSDERMAL
  Filled 2023-04-07 (×4): qty 1

## 2023-04-07 MED ORDER — PROCHLORPERAZINE EDISYLATE 10 MG/2ML IJ SOLN: 10.0000 mg | Freq: Four times a day (QID) | INTRAMUSCULAR | Status: DC | PRN

## 2023-04-07 MED ORDER — ASPIRIN 81 MG PO TBEC
81.0000 mg | DELAYED_RELEASE_TABLET | Freq: Every day | ORAL | Status: DC
  Administered 2023-04-07 – 2023-04-10 (×4): 81 mg via ORAL
  Filled 2023-04-07 (×4): qty 1

## 2023-04-07 MED ORDER — FUROSEMIDE 10 MG/ML IJ SOLN
60.0000 mg | Freq: Once | INTRAMUSCULAR | Status: AC
  Administered 2023-04-07: 60 mg via INTRAVENOUS
  Filled 2023-04-07: qty 6

## 2023-04-07 MED ORDER — SPIRONOLACTONE 25 MG PO TABS
25.0000 mg | ORAL_TABLET | Freq: Every morning | ORAL | Status: DC
  Administered 2023-04-07 – 2023-04-10 (×4): 25 mg via ORAL
  Filled 2023-04-07 (×4): qty 1

## 2023-04-07 MED ORDER — NITROGLYCERIN 0.4 MG SL SUBL
0.4000 mg | SUBLINGUAL_TABLET | Freq: Once | SUBLINGUAL | Status: AC
  Administered 2023-04-07: 0.4 mg via SUBLINGUAL
  Filled 2023-04-07: qty 1

## 2023-04-07 MED ORDER — ACETAMINOPHEN 650 MG RE SUPP: 650.0000 mg | Freq: Four times a day (QID) | RECTAL | Status: DC | PRN

## 2023-04-07 MED ORDER — FUROSEMIDE 10 MG/ML IJ SOLN
40.0000 mg | Freq: Two times a day (BID) | INTRAMUSCULAR | Status: DC
  Administered 2023-04-07 – 2023-04-09 (×5): 40 mg via INTRAVENOUS
  Filled 2023-04-07 (×5): qty 4

## 2023-04-07 MED ORDER — HYDROXYZINE PAMOATE 25 MG PO CAPS
25.0000 mg | ORAL_CAPSULE | Freq: Three times a day (TID) | ORAL | Status: DC | PRN
  Filled 2023-04-07: qty 1

## 2023-04-07 MED ORDER — CARVEDILOL 3.125 MG PO TABS
3.1250 mg | ORAL_TABLET | Freq: Two times a day (BID) | ORAL | Status: DC
  Administered 2023-04-07 – 2023-04-10 (×6): 3.125 mg via ORAL
  Filled 2023-04-07 (×6): qty 1

## 2023-04-07 MED ORDER — WARFARIN - PHYSICIAN DOSING INPATIENT: Freq: Every day | Status: DC

## 2023-04-07 MED ORDER — CHLORHEXIDINE GLUCONATE CLOTH 2 % EX PADS
6.0000 | MEDICATED_PAD | Freq: Every day | CUTANEOUS | Status: DC
  Administered 2023-04-07 – 2023-04-08 (×3): 6 via TOPICAL

## 2023-04-07 MED ORDER — WARFARIN SODIUM 5 MG PO TABS: 10.0000 mg | ORAL_TABLET | Freq: Once | ORAL | Status: DC

## 2023-04-07 MED ORDER — SERTRALINE HCL 50 MG PO TABS
50.0000 mg | ORAL_TABLET | Freq: Every day | ORAL | Status: DC
  Administered 2023-04-07 – 2023-04-10 (×4): 50 mg via ORAL
  Filled 2023-04-07 (×4): qty 1

## 2023-04-07 MED ORDER — HYDROXYZINE HCL 25 MG PO TABS
25.0000 mg | ORAL_TABLET | Freq: Three times a day (TID) | ORAL | Status: DC | PRN
  Administered 2023-04-07 – 2023-04-10 (×6): 25 mg via ORAL
  Filled 2023-04-07 (×6): qty 1

## 2023-04-07 MED ORDER — BUSPIRONE HCL 5 MG PO TABS
7.5000 mg | ORAL_TABLET | Freq: Three times a day (TID) | ORAL | Status: DC
  Administered 2023-04-07 – 2023-04-10 (×10): 7.5 mg via ORAL
  Filled 2023-04-07 (×10): qty 2

## 2023-04-07 MED ORDER — ACETAMINOPHEN 325 MG PO TABS
650.0000 mg | ORAL_TABLET | Freq: Four times a day (QID) | ORAL | Status: DC | PRN
  Administered 2023-04-07 – 2023-04-10 (×3): 650 mg via ORAL
  Filled 2023-04-07 (×2): qty 2

## 2023-04-07 MED ORDER — ORAL CARE MOUTH RINSE: 15.0000 mL | OROMUCOSAL | Status: DC | PRN

## 2023-04-07 MED ORDER — WARFARIN SODIUM 5 MG PO TABS
10.0000 mg | ORAL_TABLET | Freq: Once | ORAL | Status: AC
  Administered 2023-04-07: 10 mg via ORAL
  Filled 2023-04-07: qty 2

## 2023-04-07 NOTE — Progress Notes (Signed)
Patient has been off Bipap most of the day. Patient on 2L [place] with sat of 96%.  Bipap not needed at this time.

## 2023-04-07 NOTE — ED Provider Notes (Signed)
[facility] Emergency Department Provider Note MRN:  [id number]  Arrival date & time: 04/07/23     Chief Complaint   Shortness of Breath   History of Present Illness   Theresa Crawford is a 42 y.o. year-old female with a history of CHF presenting to the ED with chief complaint of shortness of breath.  Worsening shortness of breath over the past 3 days.  Also having central chest pressure.  Taking her medicines like normal, may be a bit of added salt in the diet recently.  No fever or cough.  Review of Systems  A thorough review of systems was obtained and all systems are negative except as noted in the HPI and PMH.   Patient's Health History    Past Medical History:  Diagnosis Date   Anxiety    Arthritis    "lower back" (04/04/2018)   Bipolar disorder (HCC)    Chronic bronchitis (HCC)    Chronic diastolic CHF (congestive heart failure) (HCC)    Chronic lower back pain    DDD (degenerative disc disease), lumbar    Depression    Dyspnea    GERD (gastroesophageal reflux disease)    Headache    "weekly" (04/04/2018), miragrains in past (04/03/2019)   Heart murmur    Hypertension    Mitral valve disease    Normocytic anemia    Obesity    Palpitations    Pericarditis    Pneumonia    Premature atrial contractions    Pulmonary hypertension (HCC)    PVC's (premature ventricular contractions)    a. h/o palpitations with event monitor in 03/2017 showing NSR with PACs/PVCs.   Recurrent mitral valve stenosis and regurgitation s/p mitral valve repair    S/P mitral valve repair 03/27/2013   Dr. Darcus Austin - [place], [place] - complex valvuloplasty including resuspension of entire posterior leaflet using Gore-tex neochords and 30 mm Edwards Physio ring annuloplasty   S/P redo mitral valve replacement with mechanical valve 04/05/2019   29 mm Sorin Carbomedics Optiform bileaflet mechanical valve   S/P tricuspid valve repair 04/05/2019   28 mm Edwards mc3 ring  annuloplasty   Tobacco abuse    Tricuspid regurgitation     Past Surgical History:  Procedure Laterality Date   ABDOMINAL HERNIA REPAIR  ~ 2011   CARDIAC CATHETERIZATION  2014   CARDIOVERSION N/A 01/14/2022   Procedure: CARDIOVERSION;  Surgeon: Antoine Poche, MD;  Location: [facility] ORS;  Service: Endoscopy;  Laterality: N/A;   CESAREAN SECTION  2004; 2009   HERNIA REPAIR     MITRAL VALVE REPAIR  03/27/2013   Dr. Darcus Austin - [place], [place]. - complex valvuloplasty including resuspension of posterior leaflet with 30 mm CE Physio ring annuloplasty   MITRAL VALVE REPLACEMENT N/A 04/05/2019   Procedure: Mitral Valve (Mv) Replacement using Carbomedics Optiform Valve size 29mm;  Surgeon: Purcell Nails, MD;  Location: [facility] OR;  Service: Open Heart Surgery;  Laterality: N/A;   MULTIPLE EXTRACTIONS WITH ALVEOLOPLASTY N/A 04/03/2018   Procedure: Extraction of tooth #30 with alveoloplasty and gross debridement of remaining teeth;  Surgeon: Charlynne Pander, DDS;  Location: [facility] OR;  Service: Oral Surgery;  Laterality: N/A;   PARTIAL HYSTERECTOMY  2011   PID w/problem with fallopian tubes, had left fallopian and left ovary removed, pt still having periods   RIGHT HEART CATH N/A 03/27/2019   Procedure: RIGHT HEART CATH;  Surgeon: Laurey Morale, MD;  Location: [facility] INVASIVE CV LAB;  Service: Cardiovascular;  Laterality: N/A;   RIGHT/LEFT HEART CATH AND CORONARY ANGIOGRAPHY N/A 03/05/2019   Procedure: RIGHT/LEFT HEART CATH AND CORONARY ANGIOGRAPHY;  Surgeon: Kathleene Hazel, MD;  Location: [facility] INVASIVE CV LAB;  Service: Cardiovascular;  Laterality: N/A;   TEE WITHOUT CARDIOVERSION N/A 05/26/2016   Procedure: TRANSESOPHAGEAL ECHOCARDIOGRAM (TEE);  Surgeon: [name], MD;  Location: [facility] ENDO SUITE;  Service: Cardiovascular;  Laterality: N/A;   TEE WITHOUT CARDIOVERSION N/A 01/25/2018   Procedure: TRANSESOPHAGEAL ECHOCARDIOGRAM (TEE) WITH PROPOFOL;  Surgeon: Jonelle Sidle, MD;  Location:  [facility] END[facility] SU[facility];  Service: Cardiovascular;  Laterality: N/A;   TEE WITHOUT CARDIOVERSION N/A 03/05/2019   Procedure: TRANSESOPHAGEAL ECHOCARDIOGRAM (TEE);  Surgeon: Pricilla Riffle, MD;  Location[facility] ENDOSCOPY;  Service: Cardiovascular;  Laterality: N/A;   TEE WITHOUT CARDIOVERSION N/A 04/05/2019   Procedure: TRANSESOPHAGEAL ECHOCARDIOGRAM (TEE);  Surgeon: Purcell Nails, MD;  Location: [facility];  Service: Open Heart Surgery;  Laterality: N/A;   TEE WITHOUT CARDIOVERSION N/A 01/14/2022   Procedure: TRANSESOPHAGEAL ECHOCARDIOGRAM (TEE);  Surgeon: Antoine Poche, MD;  Location: [facility] ORS;  Service: Endoscopy;  Laterality: N/A;   TRICUSPID VALVE REPLACEMENT N/A 04/05/2019   Procedure: Tricuspid Valve Repair using Edwards MC3 Tricuspid ring size 28mm;  Surgeon: Purcell Nails, MD;  Location: [facility] OR;  Service: Open Heart Surgery;  Laterality: N/A;    Family History  Problem Relation Age of Onset   Heart failure Father        just received LVAD   Heart disease Paternal Grandfather        stent placement    Social History   Socioeconomic History   Marital status: Single    Spouse name: Not on file   Number of children: 2   Years of education: Not on file   Highest education level: Not on file  Occupational History   Not on file  Tobacco Use   Smoking status: Every Day    Current packs/day: 0.50    Average packs/day: 0.5 packs/day for 24.0 years (12.0 ttl pk-yrs)    Types: Cigarettes    Start date: 11/03/1999   Smokeless tobacco: Never  Vaping Use   Vaping status: Never Used  Substance and Sexual Activity   Alcohol use: Yes   Drug use: Yes    Types: Marijuana   Sexual activity: Not Currently  Other Topics Concern   Not on file  Social History Narrative   Not on file   Social Determinants of Health   Financial Resource Strain: Not on file  Food Insecurity: No Food Insecurity (04/07/2023)   Hunger Vital Sign    Worried About Running Out of Food in the Last Year: Never true    Ran Out  of Food in the Last Year: Never true  Transportation Needs: No Transportation Needs (04/07/2023)   PRAPARE - Administrator, Civil Service (Medical): No    Lack of Transportation (Non-Medical): No  Physical Activity: Not on file  Stress: Not on file  Social Connections: Not on file  Intimate Partner Violence: Not At Risk (04/07/2023)   Humiliation, Afraid, Rape, and Kick questionnaire    Fear of Current or Ex-Partner: No    Emotionally Abused: No    Physically Abused: No    Sexually Abused: No     Physical Exam   Vitals:   04/07/23 0400 04/07/23 0500  BP: (!) 113/58 (!) 126/53  Pulse: 75 73  Resp: (!) 23 20  Temp:    SpO2: 98% 95%  CONSTITUTIONAL: Well-appearing, mild to moderate respiratory distress NEURO/PSYCH:  Alert and oriented x 3, no focal deficits EYES:  eyes equal and reactive ENT/NECK:  no LAD, no JVD CARDIO: Regular rate, well-perfused, normal S1 and S2 PULM: Tachypneic, rhonchi GI/GU:  non-distended, non-tender MSK/SPINE:  No gross deformities, no edema SKIN:  no rash, atraumatic   *Additional and/or pertinent findings included in MDM below  Diagnostic and Interventional Summary    EKG Interpretation Date/Time:  Thursday April 07 2023 00:15:46 EDT Ventricular Rate:  81 PR Interval:  157 QRS Duration:  87 QT Interval:  412 QTC Calculation: 479 R Axis:   52  Text Interpretation: Sinus rhythm Atrial premature complex Confirmed by Kennis Carina [phone]) on 04/07/2023 1:05:21 AM       Labs Reviewed  CBC - Abnormal; Notable for the following components:      Result Value   RDW 17.3 (*)    All other components within normal limits  COMPREHENSIVE METABOLIC PANEL - Abnormal; Notable for the following components:   Calcium 8.7 (*)    All other components within normal limits  BRAIN NATRIURETIC PEPTIDE - Abnormal; Notable for the following components:   B Natriuretic Peptide 285.0 (*)    All other components within normal limits  RAPID URINE  DRUG SCREEN, HOSP PERFORMED - Abnormal; Notable for the following components:   Cocaine POSITIVE (*)    All other components within normal limits  MRSA NEXT GEN BY PCR, NASAL  PROTIME-INR  MAGNESIUM  PHOSPHORUS  PROTIME-INR  HIV ANTIBODY (ROUTINE TESTING W REFLEX)  TROPONIN I (HIGH SENSITIVITY)  TROPONIN I (HIGH SENSITIVITY)    DG Chest Port 1 View  Final Result      Medications  Chlorhexidine Gluconate Cloth 2 % PADS 6 each (6 each Topical Given 04/07/23 0349)  furosemide (LASIX) injection 40 mg (has no administration in time range)  nicotine (NICODERM CQ - dosed in mg/24 hours) patch 21 mg (has no administration in time range)  acetaminophen (TYLENOL) tablet 650 mg (650 mg Oral Given 04/07/23 8469)    Or  acetaminophen (TYLENOL) suppository 650 mg ( Rectal See Alternative 04/07/23 6295)  prochlorperazine (COMPAZINE) injection 10 mg (has no administration in time range)  sertraline (ZOLOFT) tablet 50 mg (has no administration in time range)  busPIRone (BUSPAR) tablet 7.5 mg (has no administration in time range)  enoxaparin (LOVENOX) injection 100 mg (100 mg Subcutaneous Given 04/07/23 0413)  warfarin (COUMADIN) tablet 7.5 mg (has no administration in time range)  Warfarin - Physician Dosing Inpatient (has no administration in time range)  nitroGLYCERIN (NITROSTAT) SL tablet 0.4 mg (0.4 mg Sublingual Given 04/07/23 0050)  HYDROmorphone (DILAUDID) injection 0.5 mg (0.5 mg Intravenous Given 04/07/23 0048)  furosemide (LASIX) injection 60 mg (60 mg Intravenous Given 04/07/23 0110)  HYDROmorphone (DILAUDID) injection 0.5 mg (0.5 mg Intravenous Given 04/07/23 0412)     Procedures  /  Critical Care .Critical Care  Performed by: Sabas Sous, MD Authorized by: Sabas Sous, MD   Critical care provider statement:    Critical care time (minutes):  45   Critical care was necessary to treat or prevent imminent or life-threatening deterioration of the following conditions:  Respiratory  failure (CHF requiring noninvasive positive pressure ventilation)   Critical care was time spent personally by me on the following activities:  Development of treatment plan with patient or surrogate, discussions with consultants, evaluation of patient's response to treatment, examination of patient, ordering and review of laboratory studies, ordering and review of  radiographic studies, ordering and performing treatments and interventions, pulse oximetry, re-evaluation of patient's condition and review of old charts   ED Course and Medical Decision Making  Initial Impression and Ddx Patient is tachypneic on new oxygen requirement, 2 L, fair amount of increased respiratory effort.  Quite hypertensive on arrival as well.  Favoring CHF exacerbation.  ACS is also considered given the chest pressure.  Providing nitroglycerin tablet, BiPAP, will reassess.  Past medical/surgical history that increases complexity of ED encounter: CHF  Interpretation of Diagnostics I personally reviewed the Chest Xray and my interpretation is as follows: Pulmonary edema  Pulmonary edema labs reveal elevated BNP, UDS positive for cocaine.  Patient Reassessment and Ultimate Disposition/Management     Admitted to medicine.  Patient management required discussion with the following services or consulting groups:  Hospitalist Service  Complexity of Problems Addressed Acute illness or injury that poses threat of life of bodily function  Additional Data Reviewed and Analyzed Further history obtained from: Further history from spouse/family member  Additional Factors Impacting ED Encounter Risk Consideration of hospitalization  Elmer Sow. Pilar Plate, MD [facility] Emergency Medicine [facility] [email or web address]  Final Clinical Impressions(s) / ED Diagnoses     ICD-10-CM   1. Acute respiratory failure with hypoxia (HCC)  J96.01       ED Discharge Orders     None        Discharge  Instructions Discussed with and Provided to Patient:   Discharge Instructions   None      Sabas Sous, MD 04/07/23 [phone]

## 2023-04-07 NOTE — Consult Note (Addendum)
Cardiology Consultation   Patient ID: Theresa Crawford MRN: [id number]; DOB: 1981/01/06  Admit date: 04/07/2023 Date of Consult: 04/07/2023  PCP:  Patient, No Pcp Per   [facility] Providers Cardiologist: Previously Dr. Purvis Sheffield - Has scheduled follow-up with Dr. Jenene Slicker in 05/2023 AHF: Previously followed by Dr. Shirlee Latch in 2020  Patient Profile:   Theresa Crawford is a 42 y.o. female with a hx of HFpEF (EF 50-55% in 01/2022, at 50% by echo in 08/2022), CAD (mild non-obstructive CAD by cath in 02/2019), mechanical mitral valve (prior repair in 2014 with MV replacement in 03/2019), history of TV repair with ring annuloplasty, pulmonary HTN, persistent atrial flutter (s/p TEE/DCCV in 01/2022), substance abuse (cocaine use) and suspected systemic sarcoidosis who is being seen 04/07/2023 for the evaluation of CHF at the request of Dr. Thomes Dinning.  History of Present Illness:   Ms. [name] was last examined by the Cardiology service during an admission 01/2022 for an acute CHF exacerbation and found to be in atrial flutter with RVR which is a new diagnosis for her at that time. She did undergo successful TEE/DCCV during admission. TEE at that time did show a moderately increased gradient across the mitral valve prosthesis with mean gradient of 8 mmHg and mild mitral valve regurgitation.  She had been noncompliant with Coumadin at home and had a subtherapeutic INR on admission. She was bridged with Heparin until INR was therapeutic. She has not followed up with cardiology in the outpatient setting and has several no-show visits listed for INR checks.  Was hospitalized again in 08/2022 for an acute CHF exacerbation and INR was again subtherapeutic on admission at 1.3. Cardiology was not consulted at that time and she was discharged on Coreg 12.5 mg twice daily, Lasix 60 mg daily, Spironolactone 25 mg daily and Coumadin.  She presented to [facility] ED early this morning for evaluation of  worsening shortness of breath for the past 3 days. Was placed on BiPAP upon arrival and has been weaned to [facility]. In talking with the patient, she reports shortness of breath with walking minimal distances such as 10 ft. Also reports orthopnea, PND and lower extremity edema for 5-6 days. No chest pain or palpitations. Does not have a PCP and says her INR has not been checked in months. Says she does take her medications regularly. Says she used cocaine yesterday but that this is not a routine thing she does.   Initial labs showed WBC 10.2, Hgb 12.0, platelets 316, Na+ 137, K+ 3.9 and creatinine 0.78.  INR 1.1. BNP elevated to 285. Initial and repeat troponin values negative at 10 and 11. UDS positive for cocaine. CXR consistent with CHF. EKG shows normal sinus rhythm, heart rate 81 with first-degree AV block, PAC's and LVH.  She received IV Lasix 60 mg x 1 and has been started on IV Lasix 40 mg twice daily. Net output of -1.9 L already. She is also being bridged with Lovenox given her subtherapeutic INR.   Past Medical History:  Diagnosis Date   Anxiety    Arthritis    "lower back" (04/04/2018)   Bipolar disorder (HCC)    Chronic bronchitis (HCC)    Chronic diastolic CHF (congestive heart failure) (HCC)    Chronic lower back pain    DDD (degenerative disc disease), lumbar    Depression    Dyspnea    GERD (gastroesophageal reflux disease)    Headache    "weekly" (04/04/2018), miragrains in past (04/03/2019)  Heart murmur    Hypertension    Mitral valve disease    Normocytic anemia    Obesity    Palpitations    Pericarditis    Pneumonia    Premature atrial contractions    Pulmonary hypertension (HCC)    PVC's (premature ventricular contractions)    a. h/o palpitations with event monitor in 03/2017 showing NSR with PACs/PVCs.   Recurrent mitral valve stenosis and regurgitation s/p mitral valve repair    S/P mitral valve repair 03/27/2013   Dr. Darcus Austin - [place], [place] - complex  valvuloplasty including resuspension of entire posterior leaflet using Gore-tex neochords and 30 mm Edwards Physio ring annuloplasty   S/P redo mitral valve replacement with mechanical valve 04/05/2019   29 mm Sorin Carbomedics Optiform bileaflet mechanical valve   S/P tricuspid valve repair 04/05/2019   28 mm Edwards mc3 ring annuloplasty   Tobacco abuse    Tricuspid regurgitation     Past Surgical History:  Procedure Laterality Date   ABDOMINAL HERNIA REPAIR  ~ 2011   CARDIAC CATHETERIZATION  2014   CARDIOVERSION N/A 01/14/2022   Procedure: CARDIOVERSION;  Surgeon: Antoine Poche, MD;  Location: [facility] ORS;  Service: Endoscopy;  Laterality: N/A;   CESAREAN SECTION  2004; 2009   HERNIA REPAIR     MITRAL VALVE REPAIR  03/27/2013   Dr. Darcus Austin - [place], [place]. - complex valvuloplasty including resuspension of posterior leaflet with 30 mm CE Physio ring annuloplasty   MITRAL VALVE REPLACEMENT N/A 04/05/2019   Procedure: Mitral Valve (Mv) Replacement using Carbomedics Optiform Valve size 29mm;  Surgeon: Purcell Nails, MD;  Location: [facility] OR;  Service: Open Heart Surgery;  Laterality: N/A;   MULTIPLE EXTRACTIONS WITH ALVEOLOPLASTY N/A 04/03/2018   Procedure: Extraction of tooth #30 with alveoloplasty and gross debridement of remaining teeth;  Surgeon: Charlynne Pander, DDS;  Location: [facility] OR;  Service: Oral Surgery;  Laterality: N/A;   PARTIAL HYSTERECTOMY  2011   PID w/problem with fallopian tubes, had left fallopian and left ovary removed, pt still having periods   RIGHT HEART CATH N/A 03/27/2019   Procedure: RIGHT HEART CATH;  Surgeon: Laurey Morale, MD;  Location: [facility] INVASIVE CV LAB;  Service: Cardiovascular;  Laterality: N/A;   RIGHT/LEFT HEART CATH AND CORONARY ANGIOGRAPHY N/A 03/05/2019   Procedure: RIGHT/LEFT HEART CATH AND CORONARY ANGIOGRAPHY;  Surgeon: Kathleene Hazel, MD;  Location: [facility] INVASIVE CV LAB;  Service: Cardiovascular;  Laterality: N/A;   TEE WITHOUT  CARDIOVERSION N/A 05/26/2016   Procedure: TRANSESOPHAGEAL ECHOCARDIOGRAM (TEE);  Surgeon: [name], MD;  Location: [facility] ENDO SUITE;  Service: Cardiovascular;  Laterality: N/A;   TEE WITHOUT CARDIOVERSION N/A 01/25/2018   Procedure: TRANSESOPHAGEAL ECHOCARDIOGRAM (TEE) WITH PROPOFOL;  Surgeon: Jonelle Sidle, MD;  Location: [facility] END[facility] SUITE;  Service: Cardiovascular;  Laterality: N/A;   TEE WITHOUT CARDIOVERSION N/A 03/05/2019   Procedure: TRANSESOPHAGEAL ECHOCARDIOGRAM (TEE);  Surgeon: Pricilla Riffle, MD;  Location: [facility] ENDOSCOPY;  Service: Cardiovascular;  Laterality: N/A;   TEE WITHOUT CARDIOVERSION N/A 04/05/2019   Procedure: TRANSESOPHAGEAL ECHOCARDIOGRAM (TEE);  Surgeon: Purcell Nails, MD;  Location: [facility] OR;  Service: Open Heart Surgery;  Laterality: N/A;   TEE WITHOUT CARDIOVERSION N/A 01/14/2022   Procedure: TRANSESOPHAGEAL ECHOCARDIOGRAM (TEE);  Surgeon: Antoine Poche, MD;  Location: [facility] ORS;  Service: Endoscopy;  Laterality: N/A;   TRICUSPID VALVE REPLACEMENT N/A 04/05/2019   Procedure: Tricuspid Valve Repair using Edwards MC3 Tricuspid ring size 28mm;  Surgeon: Purcell Nails,  MD;  Location: [facility] OR;  Service: Open Heart Surgery;  Laterality: N/A;     Home Medications:  Prior to Admission medications   Medication Sig Start Date End Date Taking? Authorizing Provider  acetaminophen (TYLENOL) 325 MG tablet Take 2 tablets (650 mg total) by mouth every 6 (six) hours as needed for mild pain. 04/13/19  Yes Doree Fudge M, PA-C  albuterol (VENTOLIN HFA) 108 (90 Base) MCG/ACT inhaler Inhale 2 puffs into the lungs every 6 (six) hours as needed for wheezing or shortness of breath. 01/17/22  Yes Emokpae, Courage, MD  busPIRone (BUSPAR) 7.5 MG tablet Take 1 tablet (7.5 mg total) by mouth 3 (three) times daily. 01/17/22  Yes Shon Hale, MD  carvedilol (COREG) 12.5 MG tablet Take 1 tablet (12.5 mg total) by mouth 2 (two) times daily with a meal. 08/24/22  Yes Vassie Loll, MD   Cholecalciferol (VITAMIN D-3 PO) Take 1 capsule by mouth daily.   Yes [provider]  ferrous sulfate 325 (65 FE) MG tablet Take 1 tablet (325 mg total) by mouth daily with breakfast. 01/17/22  Yes Emokpae, Courage, MD  folic acid (FOLVITE) 400 MCG tablet Take 400 mcg by mouth daily.   Yes [provider]  furosemide (LASIX) 20 MG tablet Take 3 tablets (60 mg total) by mouth daily. 08/25/22  Yes Vassie Loll, MD  Omega-3 Fatty Acids (OMEGA-3 FISH OIL PO) Take 1 capsule by mouth daily at 6 (six) AM.   Yes [provider]  potassium chloride SA (KLOR-CON M) 20 MEQ tablet Take 40 mEq by mouth daily. 11/26/22  Yes [provider]  sertraline (ZOLOFT) 50 MG tablet Take 1 tablet (50 mg total) by mouth daily. 01/17/22 04/07/23 Yes Emokpae, Courage, MD  spironolactone (ALDACTONE) 25 MG tablet Take 1 tablet (25 mg total) by mouth in the morning. 08/24/22  Yes Vassie Loll, MD  traZODone (DESYREL) 50 MG tablet Take 1 tablet (50 mg total) by mouth at bedtime. For sleep and mood 01/17/22  Yes Emokpae, Courage, MD  vitamin B-12 (CYANOCOBALAMIN) 1000 MCG tablet Take 1,000 mcg by mouth daily.   Yes [provider]  warfarin (COUMADIN) 5 MG tablet Take 1.5 tablets (7.5 mg total) by mouth one time only at 4 PM. 08/24/22  Yes Vassie Loll, MD  hydrOXYzine (VISTARIL) 25 MG capsule Take 1 capsule (25 mg total) by mouth every 8 (eight) hours as needed for anxiety or itching. Patient not taking: Reported on 04/07/2023 08/24/22   Vassie Loll, MD  nicotine (NICODERM CQ - DOSED IN MG/24 HOURS) 21 mg/24hr patch Place 1 patch (21 mg total) onto the skin daily. Patient not taking: Reported on 04/07/2023 08/25/22   Vassie Loll, MD    Inpatient Medications: Scheduled Meds:  busPIRone  7.5 mg Oral TID   Chlorhexidine Gluconate Cloth  6 each Topical Daily   enoxaparin (LOVENOX) injection  100 mg Subcutaneous BID   furosemide  40 mg Intravenous Q12H   nicotine  21 mg  Transdermal Daily   mouth rinse  15 mL Mouth Rinse 4 times per day   sertraline  50 mg Oral Daily   spironolactone  25 mg Oral q AM   warfarin  7.5 mg Oral ONCE-1600   Warfarin - Physician Dosing Inpatient   Does not apply q1600   Continuous Infusions:  PRN Meds: acetaminophen **OR** acetaminophen, hydrOXYzine, mouth rinse, prochlorperazine  Allergies:    Allergies  Allergen Reactions   Bee Venom Anaphylaxis   Lisinopril Anaphylaxis, Shortness Of Breath, Swelling  and Other (See Comments)    Throat swells   Penicillins Shortness Of Breath, Swelling and Other (See Comments)    Has patient had a PCN reaction causing immediate rash, facial/tongue/throat swelling, SOB or lightheadedness with hypotension: Yes Has patient had a PCN reaction causing severe rash involving mucus membranes or skin necrosis: Yes Has patient had a PCN reaction that required hospitalization No Has patient had a PCN reaction occurring within the last 10 years: No If all of the above answers are "NO", then may proceed with Cephalosporin use.    Perflutren Lipid Microsphere Shortness Of Breath and Other (See Comments)    Chest Pain/Tightness, also   Shellfish Allergy Anaphylaxis   Contrast Media [Iodinated Contrast Media] Hives, Itching and Other (See Comments)    Itching and hives to throat, neck, face and arms.   No difficulty breathing.   This was given at [facility] approximately 2015. Contrast Dye    Social History:   Social History   Socioeconomic History   Marital status: Single    Spouse name: Not on file   Number of children: 2   Years of education: Not on file   Highest education level: Not on file  Occupational History   Not on file  Tobacco Use   Smoking status: Every Day    Current packs/day: 0.50    Average packs/day: 0.5 packs/day for 24.0 years (12.0 ttl pk-yrs)    Types: Cigarettes    Start date: 11/03/1999   Smokeless tobacco: Never  Vaping Use   Vaping status: Never  Used  Substance and Sexual Activity   Alcohol use: Yes   Drug use: Yes    Types: Marijuana   Sexual activity: Not Currently  Other Topics Concern   Not on file  Social History Narrative   Not on file   Social Determinants of Health   Financial Resource Strain: Not on file  Food Insecurity: No Food Insecurity (04/07/2023)   Hunger Vital Sign    Worried About Running Out of Food in the Last Year: Never true    Ran Out of Food in the Last Year: Never true  Transportation Needs: No Transportation Needs (04/07/2023)   PRAPARE - Administrator, Civil Service (Medical): No    Lack of Transportation (Non-Medical): No  Physical Activity: Not on file  Stress: Not on file  Social Connections: Not on file  Intimate Partner Violence: Not At Risk (04/07/2023)   Humiliation, Afraid, Rape, and Kick questionnaire    Fear of Current or Ex-Partner: No    Emotionally Abused: No    Physically Abused: No    Sexually Abused: No    Family History:    Family History  Problem Relation Age of Onset   Heart failure Father        just received LVAD   Heart disease Paternal Grandfather        stent placement     ROS:  Please see the history of present illness.   All other ROS reviewed and negative.     Physical Exam/Data:   Vitals:   04/07/23 0700 04/07/23 0740 04/07/23 0800 04/07/23 0809  BP: 114/72  (!) 118/59   Pulse: (!) 36 80 76 75  Resp: 20 (!) 25 (!) 26 (!) 32  Temp:    98.1 F (36.7 C)  TempSrc:    Oral  SpO2: 97% 95% 92% 91%  Weight:      Height:  Intake/Output Summary (Last 24 hours) at 04/07/2023 0838 Last data filed at 04/07/2023 0809 Gross per 24 hour  Intake 200 ml  Output 2100 ml  Net -1900 ml      04/07/2023    3:53 AM 08/24/2022    6:33 AM 08/23/2022    4:12 AM  Last 3 Weights  Weight (lbs) 228 lb 13.4 oz 228 lb 2.8 oz 223 lb 1.7 oz  Weight (kg) 103.8 kg 103.5 kg 101.2 kg     Body mass index is 39.28 kg/m.  General:  Well nourished, well  developed female appearing in no acute distress HEENT: normal Neck: JVD at jaw line. Vascular: No carotid bruits; Distal pulses 2+ bilaterally Cardiac:  normal S1, S2; RRR with frequent ectopic beats. Mechanical valve sounds noted.  Lungs: rales along bases bilaterally.  Abd: soft, nontender, no hepatomegaly  Ext: trace ankle edema bilaterally.  Musculoskeletal:  No deformities, BUE and BLE strength normal and equal Skin: warm and dry  Neuro:  CNs 2-12 intact, no focal abnormalities noted Psych:  Normal affect   EKG:  The EKG was personally reviewed and demonstrates: normal sinus rhythm, heart rate 81 with first-degree AV block, PACs and LVH. Telemetry:  Telemetry was personally reviewed and demonstrates:  NSR< HR in 60's to 70's with PAC's and PVC's. Difficult to discern p-waves at times.   Relevant CV Studies:  R/LHC: 02/2019 Prox RCA to Mid RCA lesion is 10% stenosed. Prox Cx to Mid Cx lesion is 20% stenosed. Mid LAD lesion is 10% stenosed.   1. Mild non-obstructive CAD  Echocardiogram: 08/2022 IMPRESSIONS     1. Left ventricular ejection fraction, by estimation, is 50%. The left  ventricle has low normal function. The left ventricle has no regional wall  motion abnormalities. There is mild left ventricular hypertrophy. Left  ventricular diastolic parameters are   indeterminate.   2. RV is flattened in systole suggesting RV pressure overload. . Right  ventricular systolic function is mildly reduced. The right ventricular  size is normal. There is normal pulmonary artery systolic pressure.   3. Left atrial size was mildly dilated.   4. The mitral valve has been repaired/replaced. No evidence of mitral  valve regurgitation.   5. 28 mm Edwards mc3 ring annuloplasty TV . The tricuspid valve is has  been repaired/replaced.   6. The aortic valve is tricuspid. There is mild calcification of the  aortic valve. There is mild thickening of the aortic valve. Aortic valve   regurgitation is not visualized. No aortic stenosis is present.   7. The inferior vena cava is normal in size with greater than 50%  respiratory variability, suggesting right atrial pressure of 3 mmHg.   Laboratory Data:  High Sensitivity Troponin:   Recent Labs  Lab 04/06/23 2350 04/07/23 0241  TROPONINIHS 11 10     Chemistry Recent Labs  Lab 04/06/23 2350 04/07/23 0241  NA 137  --   K 3.9  --   CL 109  --   CO2 22  --   GLUCOSE 97  --   BUN 16  --   CREATININE 0.78  --   CALCIUM 8.7*  --   MG  --  1.9  GFRNONAA >60  --   ANIONGAP 6  --     Recent Labs  Lab 04/06/23 2350  PROT 7.8  ALBUMIN 3.7  AST 19  ALT 14  ALKPHOS 50  BILITOT 0.4   Lipids No results for input(s): "CHOL", "TRIG", "HDL", "LABVLDL", "  LDLCALC", "CHOLHDL" in the last 168 hours.  Hematology Recent Labs  Lab 04/06/23 2350  WBC 10.2  RBC 4.54  HGB 12.0  HCT 37.9  MCV 83.5  MCH 26.4  MCHC 31.7  RDW 17.3*  PLT 316   Thyroid No results for input(s): "TSH", "FREET4" in the last 168 hours.  BNP Recent Labs  Lab 04/06/23 2350  BNP 285.0*    DDimer No results for input(s): "DDIMER" in the last 168 hours.   Radiology/Studies:  DG Chest Port 1 View  Result Date: 04/07/2023 CLINICAL DATA:  Chest pain and shortness of breath 3 days, initial encounter EXAM: PORTABLE CHEST 1 VIEW COMPARISON:  08/18/2022 FINDINGS: Cardiac shadow is enlarged. Postsurgical changes are noted. Vascular congestion is seen with evidence of interstitial edema slightly increased when compare is a prior exam. No bony abnormality is noted. IMPRESSION: Changes of CHF. Electronically Signed   By: Alcide Clever M.D.   On: 04/07/2023 01:14     Assessment and Plan:   1. Acute HFpEF - Admitted with an acute CHF exacerbation with BNP elevated at 285 and CXR consistent with CHF. Repeat echocardiogram pending but EF was at 50% by most recent echo in 08/2022. - She has been started on IV Lasix and received 60mg  while in the ED  and has been started on 40mg  BID (received first dose of this at 0800). Great output thus far of -1.9 L. Continue to follow I&O's along with daily weights. She is unsure of her baseline weight. Will restart PTA Spironolactone 25mg  daily. May require further titration of GDMT pending echo results. SGLT2 inhibitor would be reasonable even if HFpEF given her recurrent hospitalizations.   2. History of Valvular Heart Disease - She underwent prior MV repair in 2014 with MV replacement in 03/2019 and also had TV repair with ring annuloplasty. Echo in 08/2022 showed no significant MR. Repeat echo pending. - INR at 1.1 on admission. Pharmacy following and bridging with Lovenox until INR therapeutic.   3. CAD - She had mild non-obstructive CAD by cath in 02/2019. Hs Troponin values negative this admission. Repeat echo pending.   4. History of Persistent Atrial Flutter - In NSR currently with frequent ectopy. Was on Coreg 12.5mg  BID prior to admission. Would restart as respiratory status improves. Prefer this over Metoprolol given recent cocaine use.  - She is on Coumadin for anticoagulation. Being bridged with Lovenox given subtherapeutic INR.   5. Substance Abuse - Patient gave permission to talk openly while her friend was in the room and reported using cocaine yesterday. Risks of this were reviewed and cessation advised.    For questions or updates, please contact [facility] Please consult [email or web address] for contact info under    Signed, Ellsworth Lennox, PA-C  04/07/2023 8:38 AM   Attending note  Patient seen and discussed with PA Iran Ouch, I agree with her documentation. 41 yo female history of chronic HFpEF, mechanical MV, pulmonary HTN, aflutter with TEE/DCCV 01/2023 presents with SOB and chest pressure worth with deep inspiration. +abdominal distension. Reports medication compliance.     WBC 10.2 Hgb 12 Plt 316 K 3.9 Cr 0.78 BUN 16 BNP 284 INR 1.1 Mg 1.9  UDS + cocaine.   Trop 11-->10 CXR +CHF EKG SR  08/2022 echo: LVEF 50%, D shaped septum is systole, mild RV dysfunction, Sorin Carbomedics Optiform bileaflet mechanical valve size  29mm is in  the MV position. Moderate mean gradient across the valve of 8 mmHg.    Assessment  and Plan  1.Acute on chronic HFpEF -08/2022 echo: LVEF 50%, D shaped septum is systole, mild RV dysfunction - CXR with signs of HF, BNP 284. Initially on bipap - early I/Os data thus far shows negative 1.9 L, she received IV lasix 60mg  early AM and due for lasix 40mg  bid IV today. Labs are pending   2.Mechanical MV - INR 1.1 on admission - she has been started on therapeutic lovenox, remains on coumadin per pharmacy dosing.  - continue lovenox until INR therapeutic - with mechanical valve should be on ASA as well - f/u repeat echo  3. Substance abuse - cocaine + this admission and severel prior admit   4. Aflutter - s/p TEE/DCCV 01/2023.  - EKG SR this admit - continue coumadin for stroke prevention in setting of aflutter and mechanical MV - start back her coreg, lower dose given bp's 110s, start 3.125mg  bid   Dina Rich MD

## 2023-04-07 NOTE — Progress Notes (Signed)
ANTICOAGULATION CONSULT NOTE - Initial Consult  Pharmacy Consult for Lovenox Indication: mechanical mitral valve  Allergies  Allergen Reactions   Bee Venom Anaphylaxis   Lisinopril Anaphylaxis, Shortness Of Breath, Swelling and Other (See Comments)    Throat swells   Penicillins Shortness Of Breath, Swelling and Other (See Comments)    Has patient had a PCN reaction causing immediate rash, facial/tongue/throat swelling, SOB or lightheadedness with hypotension: Yes Has patient had a PCN reaction causing severe rash involving mucus membranes or skin necrosis: Yes Has patient had a PCN reaction that required hospitalization No Has patient had a PCN reaction occurring within the last 10 years: No If all of the above answers are "NO", then may proceed with Cephalosporin use.    Perflutren Lipid Microsphere Shortness Of Breath and Other (See Comments)    Chest Pain/Tightness, also   Shellfish Allergy Anaphylaxis   Contrast Media [Iodinated Contrast Media] Hives, Itching and Other (See Comments)    Itching and hives to throat, neck, face and arms.   No difficulty breathing.   This was given at [facility] approximately 2015. Contrast Dye    Patient Measurements:    Vital Signs: Temp: 98.6 F (37 C) (07/25 0013) BP: 116/73 (07/25 0101) Pulse Rate: 79 (07/25 0101)  Labs: Recent Labs    04/06/23 2350 04/07/23 0241  HGB 12.0  --   HCT 37.9  --   PLT 316  --   LABPROT 14.4  --   INR 1.1  --   CREATININE 0.78  --   TROPONINIHS 11 10    CrCl cannot be calculated (Unknown ideal weight.).   Medical History: Past Medical History:  Diagnosis Date   Anxiety    Arthritis    "lower back" (04/04/2018)   Bipolar disorder (HCC)    Chronic bronchitis (HCC)    Chronic diastolic CHF (congestive heart failure) (HCC)    Chronic lower back pain    DDD (degenerative disc disease), lumbar    Depression    Dyspnea    GERD (gastroesophageal reflux disease)    Headache     "weekly" (04/04/2018), miragrains in past (04/03/2019)   Heart murmur    Hypertension    Mitral valve disease    Normocytic anemia    Obesity    Palpitations    Pericarditis    Pneumonia    Premature atrial contractions    Pulmonary hypertension (HCC)    PVC's (premature ventricular contractions)    a. h/o palpitations with event monitor in 03/2017 showing NSR with PACs/PVCs.   Recurrent mitral valve stenosis and regurgitation s/p mitral valve repair    S/P mitral valve repair 03/27/2013   Dr. Darcus Austin - Natale Milch, PA - complex valvuloplasty including resuspension of entire posterior leaflet using Gore-tex neochords and 30 mm Edwards Physio ring annuloplasty   S/P redo mitral valve replacement with mechanical valve 04/05/2019   29 mm Sorin Carbomedics Optiform bileaflet mechanical valve   S/P tricuspid valve repair 04/05/2019   28 mm Edwards mc3 ring annuloplasty   Tobacco abuse    Tricuspid regurgitation     Medications:  No current facility-administered medications on file prior to encounter.   Current Outpatient Medications on File Prior to Encounter  Medication Sig Dispense Refill   acetaminophen (TYLENOL) 325 MG tablet Take 2 tablets (650 mg total) by mouth every 6 (six) hours as needed for mild pain.     albuterol (VENTOLIN HFA) 108 (90 Base) MCG/ACT inhaler Inhale 2 puffs into the  lungs every 6 (six) hours as needed for wheezing or shortness of breath. (Patient not taking: Reported on 08/19/2022) 18 g 2   busPIRone (BUSPAR) 7.5 MG tablet Take 1 tablet (7.5 mg total) by mouth 3 (three) times daily. (Patient not taking: Reported on 08/19/2022) 90 tablet 3   carvedilol (COREG) 12.5 MG tablet Take 1 tablet (12.5 mg total) by mouth 2 (two) times daily with a meal. 60 tablet 2   ferrous sulfate 325 (65 FE) MG tablet Take 1 tablet (325 mg total) by mouth daily with breakfast. (Patient not taking: Reported on 08/19/2022) 90 tablet 3   furosemide (LASIX) 20 MG tablet Take 3 tablets  (60 mg total) by mouth daily. 90 tablet 3   hydrOXYzine (VISTARIL) 25 MG capsule Take 1 capsule (25 mg total) by mouth every 8 (eight) hours as needed for anxiety or itching. 30 capsule 0   nicotine (NICODERM CQ - DOSED IN MG/24 HOURS) 21 mg/24hr patch Place 1 patch (21 mg total) onto the skin daily. 28 patch 0   potassium chloride 20 MEQ TBCR Take 40 mEq by mouth daily. Take While taking Lasix/furosemide 60 tablet 1   sertraline (ZOLOFT) 50 MG tablet Take 1 tablet (50 mg total) by mouth daily. (Patient not taking: Reported on 08/19/2022) 30 tablet 2   spironolactone (ALDACTONE) 25 MG tablet Take 1 tablet (25 mg total) by mouth in the morning. 30 tablet 3   traZODone (DESYREL) 50 MG tablet Take 1 tablet (50 mg total) by mouth at bedtime. For sleep and mood (Patient not taking: Reported on 08/19/2022) 30 tablet 3   vitamin B-12 (CYANOCOBALAMIN) 1000 MCG tablet Take 1,000 mcg by mouth daily. (Patient not taking: Reported on 08/19/2022)     warfarin (COUMADIN) 5 MG tablet Take 1.5 tablets (7.5 mg total) by mouth one time only at 4 PM. 45 tablet 2     Assessment: 42 y.o. female with h/o mechanical MVR, INR subtherapeutic, for Lovenox  Goal of Therapy:  INR 2.5-3.5 Monitor platelets by anticoagulation protocol: Yes   Plan:  Lovenox 100 mg SQ q12h  Eddie Candle 04/07/2023,3:27 AM

## 2023-04-07 NOTE — ED Triage Notes (Signed)
Pt c/o SOB x3 days, worse tonight. Hx of CHF, states she has been compliant with meds.

## 2023-04-07 NOTE — Plan of Care (Addendum)

## 2023-04-07 NOTE — TOC Initial Note (Signed)
Transition of Care [facility]) - Initial/Assessment Note    Patient Details  Name: Theresa Crawford MRN: [id number] Date of Birth: Jul 15, 1981  [facility]) CM/SW Contact:    Elliot Gault, LCSW Phone Number: 04/07/2023, 12:02 PM  Clinical Narrative:                  Pt admitted from home. MD requesting [facility] consult for SA treatment resources. Pt also high risk for readmission.  Met with pt at bedside to assess. Pt reports that she will return home at dc. She is independent in ADLs at home. Pt reports that transportation is an SDOH issue for her.  Provided verbal and written SA treatment resource information, [employer] guide, and information on [employer] (as pt uninsured). Pt states she will follow up.  Per pt, she may need assistance with transportation at dc. TOC will follow.  Expected Discharge Plan: Home/Self Care Barriers to Discharge: Continued Medical Work up   Patient Goals and CMS Choice Patient states their goals for this hospitalization and ongoing recovery are:: return home          Expected Discharge Plan and Services In-house Referral: Clinical Social Work     Living arrangements for the past 2 months: Single Family Home                                      Prior Living Arrangements/Services Living arrangements for the past 2 months: Single Family Home Lives with:: Significant Other Patient language and need for interpreter reviewed:: Yes Do you feel safe going back to the place where you live?: Yes      Need for Family Participation in Patient Care: No (Comment)     Criminal Activity/Legal Involvement Pertinent to Current Situation/Hospitalization: No - Comment as needed  Activities of Daily Living Home Assistive Devices/Equipment: None ADL Screening (condition at time of admission) Patient's cognitive ability adequate to safely complete daily activities?: Yes Is the patient deaf or have difficulty hearing?:  No Does the patient have difficulty seeing, even when wearing glasses/contacts?: No Does the patient have difficulty concentrating, remembering, or making decisions?: No Patient able to express need for assistance with ADLs?: Yes Does the patient have difficulty dressing or bathing?: No Independently performs ADLs?: Yes (appropriate for developmental age) Does the patient have difficulty walking or climbing stairs?: No Weakness of Legs: None Weakness of Arms/Hands: None  Permission Sought/Granted                  Emotional Assessment Appearance:: Appears stated age Attitude/Demeanor/Rapport: Engaged Affect (typically observed): Pleasant Orientation: : Oriented to Self, Oriented to Place, Oriented to  Time, Oriented to Situation Alcohol / Substance Use: Illicit Drugs Psych Involvement: No (comment)  Admission diagnosis:  Acute on chronic systolic CHF (congestive heart failure) (HCC) [I50.23] Patient Active Problem List   Diagnosis Date Noted   Acute on chronic systolic CHF (congestive heart failure) (HCC) 04/07/2023   Substance abuse (HCC) 08/19/2022   Encounter for therapeutic drug monitoring 01/21/2022   Atrial flutter with rapid ventricular response (HCC)    Acute HFrEF 01/12/2022   Subtherapeutic international normalized ratio (INR) 01/12/2022   History of heart valve replacement with mechanical valve 04/05/2019   S/P tricuspid valve repair 04/05/2019   Obesity (BMI 30-39.9) 03/29/2019   Sarcoidosis    Acute on chronic diastolic congestive heart failure (HCC) 03/24/2019   Normocytic  anemia    Dysuria 03/19/2019   Acute CHF (congestive heart failure) (HCC) 03/04/2019   MDD (major depressive disorder), recurrent episode (HCC) 04/05/2018   S/P tooth extraction 04/03/2018   Suicidal ideation 04/03/2018   MDD (major depressive disorder)    Chronic diastolic congestive heart failure (HCC)    Tricuspid regurgitation    Hypomagnesemia 03/07/2018   Tobacco abuse     Dyspnea 01/11/2018   Prolonged QT interval 09/10/2017   Normochromic normocytic anemia    Pulmonary edema 07/05/2017   Hypokalemia 06/12/2017   Pulmonary hypertension (HCC) 06/12/2017   Flash pulmonary edema (HCC) 06/11/2017   Depression with anxiety 06/11/2017   Recurrent mitral valve stenosis and regurgitation s/p mitral valve repair    Acute pulmonary edema (HCC) 10/10/2016   CAP (community acquired pneumonia) 05/25/2016   Leukocytosis 05/24/2016   Acute respiratory failure with hypoxia (HCC) 05/23/2016   Acute on chronic diastolic CHF (congestive heart failure) (HCC) 05/23/2016   Cough with hemoptysis 05/23/2016   Postpartum complication pericarditis in 2009 with eventual needing MVR in 2015 05/23/2016   Anemia, iron deficiency 05/23/2016   Hypertension    SOB (shortness of breath)    Respiratory distress 02/16/2016   Essential hypertension 02/16/2016   S/P mitral valve repair 03/27/2013   PCP:  Patient, No Pcp Per Pharmacy:   [id number] - [place], [place] - [address] [place] [address] Phone: [phone] Fax: [phone]     Social Determinants of Health (SDOH) Social History: SDOH Screenings   Food Insecurity: No Food Insecurity (04/07/2023)  Housing: Patient Declined (04/07/2023)  Transportation Needs: No Transportation Needs (04/07/2023)  Utilities: Not At Risk (04/07/2023)  Alcohol Screen: Low Risk  (04/05/2018)  Depression (PHQ2-9): Low Risk  (10/31/2019)  Tobacco Use: High Risk (04/07/2023)   SDOH Interventions:     Readmission Risk Interventions    04/07/2023   12:00 PM 01/13/2022    4:12 PM  Readmission Risk Prevention Plan  Transportation Screening Complete Complete  PCP or Specialist Appt within 5-7 Days  Complete  Home Care Screening  Complete  Medication Review (RN CM)  Complete  HRI or Home Care Consult Complete   Social Work Consult for Recovery Care Planning/Counseling Complete   Medication Review Special educational needs teacher) Complete

## 2023-04-07 NOTE — H&P (Addendum)
History and Physical    Patient: Theresa Crawford WNU:[id number] DOB: July 11, 1981 DOA: 04/07/2023 DOS: the patient was seen and examined on 04/07/2023 PCP: Patient, No Pcp Per  Patient coming from: Home  Chief Complaint:  Chief Complaint  Patient presents with   Shortness of Breath   HPI: Theresa Crawford is a 42 y.o. female with medical history significant of HFrEF, MVR, essential hypertension, depression with anxiety who presents to the emergency department due to 3-day onset of worsening shortness of breath which was associated with midsternal chest pressure that worsens with taking deep breath.  She complained of increased abdominal girth and some swelling in hands.  She endorsed use of cocaine yesterday. Patient states that she has been compliant with her medications, but she recently increased salt in the diet.  She denies fever, cough.  ED Course: In the emergency department, she was hemodynamically stable.  Workup in the ED showed normal CBC and BMP.  Troponin x 1 was 11, BNP 285. Chest x-ray showed changes of CHF She was treated with IV Lasix 60 mg x 1, Dilaudid 0.5 mg x 1 was given, nitroglycerin 0.4 mg sublingual x 1 was given.  Hospitalist was asked to admit patient for further evaluation and management.  Review of Systems: Review of systems as noted in the HPI. All other systems reviewed and are negative.   Past Medical History:  Diagnosis Date   Anxiety    Arthritis    "lower back" (04/04/2018)   Bipolar disorder (HCC)    Chronic bronchitis (HCC)    Chronic diastolic CHF (congestive heart failure) (HCC)    Chronic lower back pain    DDD (degenerative disc disease), lumbar    Depression    Dyspnea    GERD (gastroesophageal reflux disease)    Headache    "weekly" (04/04/2018), miragrains in past (04/03/2019)   Heart murmur    Hypertension    Mitral valve disease    Normocytic anemia    Obesity    Palpitations    Pericarditis    Pneumonia    Premature atrial  contractions    Pulmonary hypertension (HCC)    PVC's (premature ventricular contractions)    a. h/o palpitations with event monitor in 03/2017 showing NSR with PACs/PVCs.   Recurrent mitral valve stenosis and regurgitation s/p mitral valve repair    S/P mitral valve repair 03/27/2013   Dr. Darcus Austin - [place], [place] - complex valvuloplasty including resuspension of entire posterior leaflet using Gore-tex neochords and 30 mm Edwards Physio ring annuloplasty   S/P redo mitral valve replacement with mechanical valve 04/05/2019   29 mm Sorin Carbomedics Optiform bileaflet mechanical valve   S/P tricuspid valve repair 04/05/2019   28 mm Edwards mc3 ring annuloplasty   Tobacco abuse    Tricuspid regurgitation    Past Surgical History:  Procedure Laterality Date   ABDOMINAL HERNIA REPAIR  ~ 2011   CARDIAC CATHETERIZATION  2014   CARDIOVERSION N/A 01/14/2022   Procedure: CARDIOVERSION;  Surgeon: Antoine Poche, MD;  Location: [facility];  Service: Endoscopy;  Laterality: N/A;   CESAREAN SECTION  2004; 2009   HERNIA REPAIR     MITRAL VALVE REPAIR  03/27/2013   Dr. Darcus Austin - [place], [place]. - complex valvuloplasty including resuspension of posterior leaflet with 30 mm CE Physio ring annuloplasty   MITRAL VALVE REPLACEMENT N/A 04/05/2019   Procedure: Mitral Valve (Mv) Replacement using Carbomedics Optiform Valve size 29mm;  Surgeon: Purcell Nails, MD;  Location:  [facility] OR;  Service: Open Heart Surgery;  Laterality: N/A;   MULTIPLE EXTRACTIONS WITH ALVEOLOPLASTY N/A 04/03/2018   Procedure: Extraction of tooth #30 with alveoloplasty and gross debridement of remaining teeth;  Surgeon: Charlynne Pander, DDS;  Location: [facility] OR;  Service: Oral Surgery;  Laterality: N/A;   PARTIAL HYSTERECTOMY  2011   PID w/problem with fallopian tubes, had left fallopian and left ovary removed, pt still having periods   RIGHT HEART CATH N/A 03/27/2019   Procedure: RIGHT HEART CATH;  Surgeon: Laurey Morale,  MD;  Location: [facility] INVASIVE CV LAB;  Service: Cardiovascular;  Laterality: N/A;   RIGHT/LEFT HEART CATH AND CORONARY ANGIOGRAPHY N/A 03/05/2019   Procedure: RIGHT/LEFT HEART CATH AND CORONARY ANGIOGRAPHY;  Surgeon: Kathleene Hazel, MD;  Location: [facility] INVASIVE CV LAB;  Service: Cardiovascular;  Laterality: N/A;   TEE WITHOUT CARDIOVERSION N/A 05/26/2016   Procedure: TRANSESOPHAGEAL ECHOCARDIOGRAM (TEE);  Surgeon: [name], MD;  Location: [facility] ENDO SUITE;  Service: Cardiovascular;  Laterality: N/A;   TEE WITHOUT CARDIOVERSION N/A 01/25/2018   Procedure: TRANSESOPHAGEAL ECHOCARDIOGRAM (TEE) WITH PROPOFOL;  Surgeon: Jonelle Sidle, MD;  Location: [facility] SU[facility];  Service: Cardiovascular;  Laterality: N/A;   TEE WITHOUT CARDIOVERSION N/A 03/05/2019   Procedure: TRANSESOPHAGEAL ECHOCARDIOGRAM (TEE);  Surgeon: Pricilla Riffle, MD;  Location: [facility] ENDOSCOPY;  Service: Cardiovascular;  Laterality: N/A;   TEE WITHOUT CARDIOVERSION N/A 04/05/2019   Procedure: TRANSESOPHAGEAL ECHOCARDIOGRAM (TEE);  Surgeon: Purcell Nails, MD;  Location: [facility] OR;  Service: Open Heart Surgery;  Laterality: N/A;   TEE WITHOUT CARDIOVERSION N/A 01/14/2022   Procedure: TRANSESOPHAGEAL ECHOCARDIOGRAM (TEE);  Surgeon: Antoine Poche, MD;  Location: [facility]S;  Service: Endoscopy;  Laterality: N/A;   TRICUSPID VALVE REPLACEMENT N/A 04/05/2019   Procedure: Tricuspid Valve Repair using Edwards MC3 Tricuspid ring size 28mm;  Surgeon: Purcell Nails, MD;  Location: [facility] OR;  Service: Open Heart Surgery;  Laterality: N/A;    Social History:  reports that she has been smoking cigarettes. She started smoking about 23 years ago. She has a 12 pack-year smoking history. She has never used smokeless tobacco. She reports current alcohol use. She reports current drug use. Drug: Marijuana.   Allergies  Allergen Reactions   Bee Venom Anaphylaxis   Lisinopril Anaphylaxis, Shortness Of Breath, Swelling and Other (See Comments)    Throat  swells   Penicillins Shortness Of Breath, Swelling and Other (See Comments)    Has patient had a PCN reaction causing immediate rash, facial/tongue/throat swelling, SOB or lightheadedness with hypotension: Yes Has patient had a PCN reaction causing severe rash involving mucus membranes or skin necrosis: Yes Has patient had a PCN reaction that required hospitalization No Has patient had a PCN reaction occurring within the last 10 years: No If all of the above answers are "NO", then may proceed with Cephalosporin use.    Perflutren Lipid Microsphere Shortness Of Breath and Other (See Comments)    Chest Pain/Tightness, also   Shellfish Allergy Anaphylaxis   Contrast Media [Iodinated Contrast Media] Hives, Itching and Other (See Comments)    Itching and hives to throat, neck, face and arms.   No difficulty breathing.   This was given at [facility] approximately 2015. Contrast Dye    Family History  Problem Relation Age of Onset   Heart failure Father        just received LVAD   Heart disease Paternal Grandfather        stent placement  Prior to Admission medications   Medication Sig Start Date End Date Taking? Authorizing Provider  acetaminophen (TYLENOL) 325 MG tablet Take 2 tablets (650 mg total) by mouth every 6 (six) hours as needed for mild pain. 04/13/19   Ardelle Balls, PA-C  albuterol (VENTOLIN HFA) 108 (90 Base) MCG/ACT inhaler Inhale 2 puffs into the lungs every 6 (six) hours as needed for wheezing or shortness of breath. Patient not taking: Reported on 08/19/2022 01/17/22   Shon Hale, MD  busPIRone (BUSPAR) 7.5 MG tablet Take 1 tablet (7.5 mg total) by mouth 3 (three) times daily. Patient not taking: Reported on 08/19/2022 01/17/22   Shon Hale, MD  carvedilol (COREG) 12.5 MG tablet Take 1 tablet (12.5 mg total) by mouth 2 (two) times daily with a meal. 08/24/22   Vassie Loll, MD  ferrous sulfate 325 (65 FE) MG tablet Take 1 tablet (325 mg  total) by mouth daily with breakfast. Patient not taking: Reported on 08/19/2022 01/17/22   Shon Hale, MD  furosemide (LASIX) 20 MG tablet Take 3 tablets (60 mg total) by mouth daily. 08/25/22   Vassie Loll, MD  hydrOXYzine (VISTARIL) 25 MG capsule Take 1 capsule (25 mg total) by mouth every 8 (eight) hours as needed for anxiety or itching. 08/24/22   Vassie Loll, MD  nicotine (NICODERM CQ - DOSED IN MG/24 HOURS) 21 mg/24hr patch Place 1 patch (21 mg total) onto the skin daily. 08/25/22   Vassie Loll, MD  potassium chloride 20 MEQ TBCR Take 40 mEq by mouth daily. Take While taking Lasix/furosemide 08/24/22   Vassie Loll, MD  sertraline (ZOLOFT) 50 MG tablet Take 1 tablet (50 mg total) by mouth daily. Patient not taking: Reported on 08/19/2022 01/17/22 01/17/23  Shon Hale, MD  spironolactone (ALDACTONE) 25 MG tablet Take 1 tablet (25 mg total) by mouth in the morning. 08/24/22   Vassie Loll, MD  traZODone (DESYREL) 50 MG tablet Take 1 tablet (50 mg total) by mouth at bedtime. For sleep and mood Patient not taking: Reported on 08/19/2022 01/17/22   Shon Hale, MD  vitamin B-12 (CYANOCOBALAMIN) 1000 MCG tablet Take 1,000 mcg by mouth daily. Patient not taking: Reported on 08/19/2022    [provider]  warfarin (COUMADIN) 5 MG tablet Take 1.5 tablets (7.5 mg total) by mouth one time only at 4 PM. 08/24/22   Vassie Loll, MD    Physical Exam: BP 116/73   Pulse 79   Temp 98.6 F (37 C)   Resp 19   SpO2 99%   General: 42 y.o. year-old female well developed well nourished on BiPAP and with mild acute distress.  Alert and oriented x3. HEENT: NCAT, EOMI Neck: Supple, trachea medial Cardiovascular: Regular rate and rhythm with no rubs or gallops.  No thyromegaly or JVD noted.  No lower extremity edema. 2/4 pulses in all 4 extremities. Respiratory: Rales bilaterally in lower lobes on auscultation with no wheezes.   Abdomen: Soft, nontender nondistended with normal  bowel sounds x4 quadrants. Muskuloskeletal: No cyanosis, clubbing or edema noted bilaterally Neuro: CN II-XII intact, strength 5/5 x 4, sensation, reflexes intact Skin: No ulcerative lesions noted or rashes Psychiatry: Judgement and insight appear normal. Mood is appropriate for condition and setting          Labs on Admission:  Basic Metabolic Panel: Recent Labs  Lab 04/06/23 2350  NA 137  K 3.9  CL 109  CO2 22  GLUCOSE 97  BUN 16  CREATININE 0.78  CALCIUM 8.7*  Liver Function Tests: Recent Labs  Lab 04/06/23 2350  AST 19  ALT 14  ALKPHOS 50  BILITOT 0.4  PROT 7.8  ALBUMIN 3.7   No results for input(s): "LIPASE", "AMYLASE" in the last 168 hours. No results for input(s): "AMMONIA" in the last 168 hours. CBC: Recent Labs  Lab 04/06/23 2350  WBC 10.2  HGB 12.0  HCT 37.9  MCV 83.5  PLT 316   Cardiac Enzymes: No results for input(s): "CKTOTAL", "CKMB", "CKMBINDEX", "TROPONINI" in the last 168 hours.  BNP (last 3 results) Recent Labs    08/18/22 2125 04/06/23 2350  BNP 341.0* 285.0*    ProBNP (last 3 results) No results for input(s): "PROBNP" in the last 8760 hours.  CBG: No results for input(s): "GLUCAP" in the last 168 hours.  Radiological Exams on Admission: DG Chest Port 1 View  Result Date: 04/07/2023 CLINICAL DATA:  Chest pain and shortness of breath 3 days, initial encounter EXAM: PORTABLE CHEST 1 VIEW COMPARISON:  08/18/2022 FINDINGS: Cardiac shadow is enlarged. Postsurgical changes are noted. Vascular congestion is seen with evidence of interstitial edema slightly increased when compare is a prior exam. No bony abnormality is noted. IMPRESSION: Changes of CHF. Electronically Signed   By: Alcide Clever M.D.   On: 04/07/2023 01:14    EKG: I independently viewed the EKG done and my findings are as followed: Normal sinus rhythm at a rate of 81 bpm  Assessment/Plan Present on Admission:  Acute on chronic systolic CHF (congestive heart failure)  (HCC)  Acute respiratory failure with hypoxia (HCC)  Tobacco abuse  Substance abuse (HCC)  Obesity (BMI 30-39.9)  Depression with anxiety  Principal Problem:   Acute on chronic systolic CHF (congestive heart failure) (HCC) Active Problems:   Acute respiratory failure with hypoxia (HCC)   Depression with anxiety   Tobacco abuse   Obesity (BMI 30-39.9)   History of heart valve replacement with mechanical valve   Substance abuse (HCC)  Acute on chronic systolic CHF Echocardiogram done on 08/19/2022 showed LVEF of 50%.  No RWMA.  Mild LVH. Continue total input/output, daily weights and fluid restriction IV Lasix 60 mg x 1 was given with urine output of Continue IV Lasix 40 milligram twice daily Continue heart healthy diet  Continue BiPAP with goal to wean patient off this as tolerated Echocardiogram in the morning  Cardiology will be consulted and we shall await further recommendation  Acute respiratory failure with hypoxia requiring NIPPV Patient is currently on BiPAP, patient will be weaned off this as tolerated  History of heart valve replacement with mechanical valve INR subtherapeutic at time of admission (1.1) INR goal is for INR 2.5-3.5 Continue warfarin and bridge with Lovenox as prior pharmacy recommendations provided  Substance abuse Patient endorsed use of cocaine yesterday She was counseled on cocaine use cessation Urine drug screen pending   Tobacco abuse Patient was counseled on tobacco abuse cessation Continue nicotine patch.  Anxiety/depression Continue home Buspar, zoloft  Obesity class II (BMI 39.2) Diet and lifestyle modification Patient may need to follow-up with PCP for weight loss program  DVT prophylaxis: Lovenox/warfarin  Advance Care Planning: Full code  Consults: Cardiology Family Communication: Boyfriend at bedside (all questions answered to satisfaction)  Severity of Illness: The appropriate patient status for this patient is  INPATIENT. Inpatient status is judged to be reasonable and necessary in order to provide the required intensity of service to ensure the patient's safety. The patient's presenting symptoms, physical exam findings, and initial radiographic  and laboratory data in the context of their chronic comorbidities is felt to place them at high risk for further clinical deterioration. Furthermore, it is not anticipated that the patient will be medically stable for discharge from the hospital within 2 midnights of admission.   * I certify that at the point of admission it is my clinical judgment that the patient will require inpatient hospital care spanning beyond 2 midnights from the point of admission due to high intensity of service, high risk for further deterioration and high frequency of surveillance required.*  Author: Frankey Shown, DO 04/07/2023 3:41 AM  For on call review [email or web address].

## 2023-04-07 NOTE — ED Notes (Signed)
Pure Wick placed due to patient getting IV Lasix, and Bipap. Patient cannot tolerate bedpan.

## 2023-04-07 NOTE — ED Notes (Signed)
ED TO INPATIENT HANDOFF REPORT  ED Nurse Name and Phone #: Earnest Rosier Name/Age/Gender Norwood Levo 42 y.o. female Room/Bed: APA06/APA06  Code Status   Code Status: Full Code  Home/SNF/Other Home Patient oriented to: self, place, time, and situation Is this baseline? Yes   Triage Complete: Triage complete  Chief Complaint Acute on chronic systolic CHF (congestive heart failure) (HCC) [I50.23]  Triage Note Pt c/o SOB x3 days, worse tonight. Hx of CHF, states she has been compliant with meds.    Allergies Allergies  Allergen Reactions   Bee Venom Anaphylaxis   Lisinopril Anaphylaxis, Shortness Of Breath, Swelling and Other (See Comments)    Throat swells   Penicillins Shortness Of Breath, Swelling and Other (See Comments)    Has patient had a PCN reaction causing immediate rash, facial/tongue/throat swelling, SOB or lightheadedness with hypotension: Yes Has patient had a PCN reaction causing severe rash involving mucus membranes or skin necrosis: Yes Has patient had a PCN reaction that required hospitalization No Has patient had a PCN reaction occurring within the last 10 years: No If all of the above answers are "NO", then may proceed with Cephalosporin use.    Perflutren Lipid Microsphere Shortness Of Breath and Other (See Comments)    Chest Pain/Tightness, also   Shellfish Allergy Anaphylaxis   Contrast Media [Iodinated Contrast Media] Hives, Itching and Other (See Comments)    Itching and hives to throat, neck, face and arms.   No difficulty breathing.   This was given at [facility] approximately 2015. Contrast Dye    Level of Care/Admitting Diagnosis ED Disposition     ED Disposition  Admit   Condition  --   Comment  Hospital Area: [facility] [[id number]]  Level of Care: Stepdown [14]  Covid Evaluation: Asymptomatic - no recent exposure (last 10 days) testing not required  Diagnosis: Acute on chronic systolic CHF (congestive  heart failure) [facility]) [[id number]]  Admitting Physician: Frankey Shown [[id number]]  Attending Physician: Frankey Shown [[id number]]  Certification:: I certify this patient will need inpatient services for at least 2 midnights  Estimated Length of Stay: 3          B Medical/Surgery History Past Medical History:  Diagnosis Date   Anxiety    Arthritis    "lower back" (04/04/2018)   Bipolar disorder (HCC)    Chronic bronchitis (HCC)    Chronic diastolic CHF (congestive heart failure) (HCC)    Chronic lower back pain    DDD (degenerative disc disease), lumbar    Depression    Dyspnea    GERD (gastroesophageal reflux disease)    Headache    "weekly" (04/04/2018), miragrains in past (04/03/2019)   Heart murmur    Hypertension    Mitral valve disease    Normocytic anemia    Obesity    Palpitations    Pericarditis    Pneumonia    Premature atrial contractions    Pulmonary hypertension (HCC)    PVC's (premature ventricular contractions)    a. h/o palpitations with event monitor in 03/2017 showing NSR with PACs/PVCs.   Recurrent mitral valve stenosis and regurgitation s/p mitral valve repair    S/P mitral valve repair 03/27/2013   Dr. Darcus Austin [place] - complex valvuloplasty including resuspension of entire posterior leaflet using Gore-tex neochords and 30 mm Edwards Physio ring annuloplasty   S/P redo mitral valve replacement with mechanical valve 04/05/2019   29 mm Sorin Carbomedics Optiform bileaflet mechanical valve  S/P tricuspid valve repair 04/05/2019   28 mm Edwards mc3 ring annuloplasty   Tobacco abuse    Tricuspid regurgitation    Past Surgical History:  Procedure Laterality Date   ABDOMINAL HERNIA REPAIR  ~ 2011   CARDIAC CATHETERIZATION  2014   CARDIOVERSION N/A 01/14/2022   Procedure: CARDIOVERSION;  Surgeon: Antoine Poche, MD;  Location: [facility] ORS;  Service: Endoscopy;  Laterality: N/A;   CESAREAN SECTION  2004; 2009   HERNIA REPAIR     MITRAL  VALVE REPAIR  03/27/2013   Dr. Darcus Austin - [facility], [place]. - complex valvuloplasty including resuspension of posterior leaflet with 30 mm CE Physio ring annuloplasty   MITRAL VALVE REPLACEMENT N/A 04/05/2019   Procedure: Mitral Valve (Mv) Replacement using Carbomedics Optiform Valve size 29mm;  Surgeon: Purcell Nails, MD;  Location: [facility] OR;  Service: Open Heart Surgery;  Laterality: N/A;   MULTIPLE EXTRACTIONS WITH ALVEOLOPLASTY N/A 04/03/2018   Procedure: Extraction of tooth #30 with alveoloplasty and gross debridement of remaining teeth;  Surgeon: Charlynne Pander, DDS;  Location: [facility] OR;  Service: Oral Surgery;  Laterality: N/A;   PARTIAL HYSTERECTOMY  2011   PID w/problem with fallopian tubes, had left fallopian and left ovary removed, pt still having periods   RIGHT HEART CATH N/A 03/27/2019   Procedure: RIGHT HEART CATH;  Surgeon: Laurey Morale, MD;  Location: [facility] INVASIVE CV LAB;  Service: Cardiovascular;  Laterality: N/A;   RIGHT/LEFT HEART CATH AND CORONARY ANGIOGRAPHY N/A 03/05/2019   Procedure: RIGHT/LEFT HEART CATH AND CORONARY ANGIOGRAPHY;  Surgeon: Kathleene Hazel, MD;  Location: [facility] INVASIVE CV LAB;  Service: Cardiovascular;  Laterality: N/A;   TEE WITHOUT CARDIOVERSION N/A 05/26/2016   Procedure: TRANSESOPHAGEAL ECHOCARDIOGRAM (TEE);  Surgeon: [name], MD;  Location: [facility] ENDO SUITE;  Service: Cardiovascular;  Laterality: N/A;   TEE WITHOUT CARDIOVERSION N/A 01/25/2018   Procedure: TRANSESOPHAGEAL ECHOCARDIOGRAM (TEE) WITH PROPOFOL;  Surgeon: Jonelle Sidle, MD;  Location: [facility] ENDO SUITE;  Service: Cardiovascular;  Laterality: N/A;   TEE WITHOUT CARDIOVERSION N/A 03/05/2019   Procedure: TRANSESOPHAGEAL ECHOCARDIOGRAM (TEE);  Surgeon: Pricilla Riffle, MD;  Location: [facility] ENDOSCOPY;  Service: Cardiovascular;  Laterality: N/A;   TEE WITHOUT CARDIOVERSION N/A 04/05/2019   Procedure: TRANSESOPHAGEAL ECHOCARDIOGRAM (TEE);  Surgeon: Purcell Nails, MD;  Location: [facility]  OR;  Service: Open Heart Surgery;  Laterality: N/A;   TEE WITHOUT CARDIOVERSION N/A 01/14/2022   Procedure: TRANSESOPHAGEAL ECHOCARDIOGRAM (TEE);  Surgeon: Antoine Poche, MD;  Location: [facility] ORS;  Service: Endoscopy;  Laterality: N/A;   TRICUSPID VALVE REPLACEMENT N/A 04/05/2019   Procedure: Tricuspid Valve Repair using Edwards MC3 Tricuspid ring size 28mm;  Surgeon: Purcell Nails, MD;  Location: [facility] OR;  Service: Open Heart Surgery;  Laterality: N/A;     A IV Location/Drains/Wounds Patient Lines/Drains/Airways Status     Active Line/Drains/Airways     Name Placement date Placement time Site Days   Peripheral IV 04/07/23 20 G 1" Anterior;Proximal;Right Antecubital 04/07/23  0048  Antecubital  less than 1   External Urinary Catheter 04/07/23  0131  --  less than 1            Intake/Output Last 24 hours  Intake/Output Summary (Last 24 hours) at 04/07/2023 0326 Last data filed at 04/07/2023 0300 Gross per 24 hour  Intake --  Output 1750 ml  Net -1750 ml    Labs/Imaging Results for orders placed or performed during the hospital encounter of 04/07/23 (from the past 48  hour(s))  CBC     Status: Abnormal   Collection Time: 04/06/23 11:50 PM  Result Value Ref Range   WBC 10.2 4.0 - 10.5 K/uL   RBC 4.54 3.87 - 5.11 MIL/uL   Hemoglobin 12.0 12.0 - 15.0 g/dL   HCT 59.5 63.8 - 75.6 %   MCV 83.5 80.0 - 100.0 fL   MCH 26.4 26.0 - 34.0 pg   MCHC 31.7 30.0 - 36.0 g/dL   RDW 43.3 (H) 29.5 - 18.8 %   Platelets 316 150 - 400 K/uL   nRBC 0.0 0.0 - 0.2 %    Comment: Performed at [facility], [address], [place], [place] [address]  Comprehensive metabolic panel     Status: Abnormal   Collection Time: 04/06/23 11:50 PM  Result Value Ref Range   Sodium 137 135 - 145 mmol/L   Potassium 3.9 3.5 - 5.1 mmol/L   Chloride 109 98 - 111 mmol/L   CO2 22 22 - 32 mmol/L   Glucose, Bld 97 70 - 99 mg/dL    Comment: Glucose reference range applies only to samples taken after fasting for at  least 8 hours.   BUN 16 6 - 20 mg/dL   Creatinine, Ser 6.30 0.44 - 1.00 mg/dL   Calcium 8.7 (L) 8.9 - 10.3 mg/dL   Total Protein 7.8 6.5 - 8.1 g/dL   Albumin 3.7 3.5 - 5.0 g/dL   AST 19 15 - 41 U/L   ALT 14 0 - 44 U/L   Alkaline Phosphatase 50 38 - 126 U/L   Total Bilirubin 0.4 0.3 - 1.2 mg/dL   GFR, Estimated >16 >01 mL/min    Comment: (NOTE) Calculated using the CKD-EPI Creatinine Equation (2021)    Anion gap 6 5 - 15    Comment: Performed at [facility], [address], [place], [place] [address]  Brain natriuretic peptide     Status: Abnormal   Collection Time: 04/06/23 11:50 PM  Result Value Ref Range   B Natriuretic Peptide 285.0 (H) 0.0 - 100.0 pg/mL    Comment: Performed at [facility], [address], [place], [place] [address]  Troponin I (High Sensitivity)     Status: None   Collection Time: 04/06/23 11:50 PM  Result Value Ref Range   Troponin I (High Sensitivity) 11 <18 ng/L    Comment: (NOTE) Elevated high sensitivity troponin I (hsTnI) values and significant  changes across serial measurements may suggest ACS but many other  chronic and acute conditions are known to elevate hsTnI results.  Refer to the "Links" section for chest pain algorithms and additional  guidance. Performed at [facility], [address] [place], [place] [address]   Protime-INR     Status: None   Collection Time: 04/06/23 11:50 PM  Result Value Ref Range   Prothrombin Time 14.4 11.4 - 15.2 seconds   INR 1.1 0.8 - 1.2    Comment: (NOTE) INR goal varies based on device and disease states. Performed at [facility], [address] [place], [place] [address]   Troponin I (High Sensitivity)     Status: None   Collection Time: 04/07/23  2:41 AM  Result Value Ref Range   Troponin I (High Sensitivity) 10 <18 ng/L    Comment: (NOTE) Elevated high sensitivity troponin I (hsTnI) values and significant  changes across serial measurements may suggest ACS but many other  chronic and acute  conditions are known to elevate hsTnI results.  Refer to the "Links" section  for chest pain algorithms and additional  guidance. Performed at [facility], [address], [place], [place] [address]    DG Chest Port 1 View  Result Date: 04/07/2023 CLINICAL DATA:  Chest pain and shortness of breath 3 days, initial encounter EXAM: PORTABLE CHEST 1 VIEW COMPARISON:  08/18/2022 FINDINGS: Cardiac shadow is enlarged. Postsurgical changes are noted. Vascular congestion is seen with evidence of interstitial edema slightly increased when compare is a prior exam. No bony abnormality is noted. IMPRESSION: Changes of CHF. Electronically Signed   By: Alcide Clever M.D.   On: 04/07/2023 01:14    Pending Labs Unresulted Labs (From admission, onward)     Start     Ordered   04/08/23 0500  Comprehensive metabolic panel  Tomorrow morning,   R        04/07/23 0326   04/08/23 0500  CBC  Tomorrow morning,   R        04/07/23 0326   04/07/23 0327  Magnesium  Add-on,   AD        04/07/23 0326   04/07/23 0327  Phosphorus  Add-on,   AD        04/07/23 0326   04/07/23 0322  HIV Antibody (routine testing w rflx)  (HIV Antibody (Routine testing w reflex) panel)  Once,   R        04/07/23 0326   04/07/23 0302  Rapid urine drug screen (hospital performed)  Add-on,   AD        04/07/23 0302   04/07/23 0240  MRSA Next Gen by PCR, Nasal  Once,   R        04/07/23 0239            Vitals/Pain Today's Vitals   04/07/23 0045 04/07/23 0100 04/07/23 0101 04/07/23 0116  BP: 116/73 126/69 116/73   Pulse: 82 80 79   Resp: (!) 27 10 19    Temp:      SpO2: 98% 99%    PainSc:    8     Isolation Precautions No active isolations  Medications Medications  Chlorhexidine Gluconate Cloth 2 % PADS 6 each (has no administration in time range)  furosemide (LASIX) injection 40 mg (has no administration in time range)  nicotine (NICODERM CQ - dosed in mg/24 hours) patch 21 mg (has no administration in time range)   acetaminophen (TYLENOL) tablet 650 mg (has no administration in time range)    Or  acetaminophen (TYLENOL) suppository 650 mg (has no administration in time range)  prochlorperazine (COMPAZINE) injection 10 mg (has no administration in time range)  nitroGLYCERIN (NITROSTAT) SL tablet 0.4 mg (0.4 mg Sublingual Given 04/07/23 0050)  HYDROmorphone (DILAUDID) injection 0.5 mg (0.5 mg Intravenous Given 04/07/23 0048)  furosemide (LASIX) injection 60 mg (60 mg Intravenous Given 04/07/23 0110)    Mobility walks     Focused Assessments Pulmonary Assessment Handoff:  Lung sounds: Bilateral Breath Sounds: Diminished O2 Device: Bi-PAP      R Recommendations: See Admitting Provider Note  Report given to:   Additional Notes: .

## 2023-04-07 NOTE — Progress Notes (Addendum)
Patient had increased WOB.  Placed on Bipap, BS diminished.  WOB has improved, rate at 19 and patient relaxed and trying to sleep.

## 2023-04-07 NOTE — Progress Notes (Signed)
Transported patient from ED to ICU 2 on Bipap without incident.

## 2023-04-07 NOTE — Progress Notes (Signed)
ANTICOAGULATION CONSULT NOTE - Initial Consult  Pharmacy Consult for Lovenox Indication: mechanical mitral valve  Allergies  Allergen Reactions   Bee Venom Anaphylaxis   Lisinopril Anaphylaxis, Shortness Of Breath, Swelling and Other (See Comments)    Throat swells   Penicillins Shortness Of Breath, Swelling and Other (See Comments)    Has patient had a PCN reaction causing immediate rash, facial/tongue/throat swelling, SOB or lightheadedness with hypotension: Yes Has patient had a PCN reaction causing severe rash involving mucus membranes or skin necrosis: Yes Has patient had a PCN reaction that required hospitalization No Has patient had a PCN reaction occurring within the last 10 years: No If all of the above answers are "NO", then may proceed with Cephalosporin use.    Perflutren Lipid Microsphere Shortness Of Breath and Other (See Comments)    Chest Pain/Tightness, also   Shellfish Allergy Anaphylaxis   Contrast Media [Iodinated Contrast Media] Hives, Itching and Other (See Comments)    Itching and hives to throat, neck, face and arms.   No difficulty breathing.   This was given at [facility] approximately 2015. Contrast Dye    Patient Measurements: Height: 5\' 4"  (162.6 cm) Weight: 103.8 kg (228 lb 13.4 oz) IBW/kg (Calculated) : 54.7  Vital Signs: Temp: 97.3 F (36.3 C) (07/25 1100) Temp Source: Axillary (07/25 1100) BP: 122/58 (07/25 1000) Pulse Rate: 78 (07/25 1100)  Labs: Recent Labs    04/06/23 2350 04/07/23 0241 04/07/23 0449  HGB 12.0  --   --   HCT 37.9  --   --   PLT 316  --   --   LABPROT 14.4  --  14.5  INR 1.1  --  1.1  CREATININE 0.78  --   --   TROPONINIHS 11 10  --     Estimated Creatinine Clearance: 106.4 mL/min (by C-G formula based on SCr of 0.78 mg/dL).   Medical History: Past Medical History:  Diagnosis Date   Anxiety    Arthritis    "lower back" (04/04/2018)   Bipolar disorder (HCC)    Chronic bronchitis (HCC)     Chronic diastolic CHF (congestive heart failure) (HCC)    Chronic lower back pain    DDD (degenerative disc disease), lumbar    Depression    Dyspnea    GERD (gastroesophageal reflux disease)    Headache    "weekly" (04/04/2018), miragrains in past (04/03/2019)   Heart murmur    Hypertension    Mitral valve disease    Normocytic anemia    Obesity    Palpitations    Pericarditis    Pneumonia    Premature atrial contractions    Pulmonary hypertension (HCC)    PVC's (premature ventricular contractions)    a. h/o palpitations with event monitor in 03/2017 showing NSR with PACs/PVCs.   Recurrent mitral valve stenosis and regurgitation s/p mitral valve repair    S/P mitral valve repair 03/27/2013   Dr. Darcus Austin - [place] - complex valvuloplasty including resuspension of entire posterior leaflet using Gore-tex neochords and 30 mm Edwards Physio ring annuloplasty   S/P redo mitral valve replacement with mechanical valve 04/05/2019   29 mm Sorin Carbomedics Optiform bileaflet mechanical valve   S/P tricuspid valve repair 04/05/2019   28 mm Edwards mc3 ring annuloplasty   Tobacco abuse    Tricuspid regurgitation     Medications:  No current facility-administered medications on file prior to encounter.   Current Outpatient Medications on File Prior to Encounter  Medication Sig Dispense Refill   acetaminophen (TYLENOL) 325 MG tablet Take 2 tablets (650 mg total) by mouth every 6 (six) hours as needed for mild pain.     albuterol (VENTOLIN HFA) 108 (90 Base) MCG/ACT inhaler Inhale 2 puffs into the lungs every 6 (six) hours as needed for wheezing or shortness of breath. 18 g 2   busPIRone (BUSPAR) 7.5 MG tablet Take 1 tablet (7.5 mg total) by mouth 3 (three) times daily. 90 tablet 3   carvedilol (COREG) 12.5 MG tablet Take 1 tablet (12.5 mg total) by mouth 2 (two) times daily with a meal. 60 tablet 2   Cholecalciferol (VITAMIN D-3 PO) Take 1 capsule by mouth daily.     ferrous  sulfate 325 (65 FE) MG tablet Take 1 tablet (325 mg total) by mouth daily with breakfast. 90 tablet 3   folic acid (FOLVITE) 400 MCG tablet Take 400 mcg by mouth daily.     furosemide (LASIX) 20 MG tablet Take 3 tablets (60 mg total) by mouth daily. 90 tablet 3   Omega-3 Fatty Acids (OMEGA-3 FISH OIL PO) Take 1 capsule by mouth daily at 6 (six) AM.     potassium chloride SA (KLOR-CON M) 20 MEQ tablet Take 40 mEq by mouth daily.     sertraline (ZOLOFT) 50 MG tablet Take 1 tablet (50 mg total) by mouth daily. 30 tablet 2   spironolactone (ALDACTONE) 25 MG tablet Take 1 tablet (25 mg total) by mouth in the morning. 30 tablet 3   traZODone (DESYREL) 50 MG tablet Take 1 tablet (50 mg total) by mouth at bedtime. For sleep and mood 30 tablet 3   vitamin B-12 (CYANOCOBALAMIN) 1000 MCG tablet Take 1,000 mcg by mouth daily.     warfarin (COUMADIN) 5 MG tablet Take 1.5 tablets (7.5 mg total) by mouth one time only at 4 PM. 45 tablet 2   hydrOXYzine (VISTARIL) 25 MG capsule Take 1 capsule (25 mg total) by mouth every 8 (eight) hours as needed for anxiety or itching. (Patient not taking: Reported on 04/07/2023) 30 capsule 0   nicotine (NICODERM CQ - DOSED IN MG/24 HOURS) 21 mg/24hr patch Place 1 patch (21 mg total) onto the skin daily. (Patient not taking: Reported on 04/07/2023) 28 patch 0   [DISCONTINUED] potassium chloride 20 MEQ TBCR Take 40 mEq by mouth daily. Take While taking Lasix/furosemide (Patient not taking: Reported on 04/07/2023) 60 tablet 1     Assessment: 42 y.o. female with PMH VHD s/p MVR in 2020 (INR goal ), HFpEF, CAD, pulm HTN, persistent Afib/flutter s/p DCCV in 2023 presenting with SOB in the setting of cocaine use and CHF and Afib. INR is subtherapeutic at 1.1. Last dose of warfarin was 7/24 per med rec.  Meds: Warfarin (home regimen is 7.5 mg daily) Lovenox 100 mg IV q12H while bridging  Goal of Therapy:  INR 2.5-3.5 Monitor platelets by anticoagulation protocol: Yes   Plan:  Give  warfarin 10 mg PO x 1 Continue enoxaparin 100 mg IV q12H while INR subtherapeutic CBC and INR daily while on warfarin and enoxaparin  Will M. Dareen Piano, PharmD Clinical Pharmacist 04/07/2023 11:33 AM

## 2023-04-07 NOTE — Progress Notes (Signed)
  Echocardiogram 2D Echocardiogram has been performed.  Theresa Crawford 04/07/2023, 3:42 PM

## 2023-04-07 NOTE — Progress Notes (Addendum)
PROGRESS NOTE  Theresa Crawford, is a 42 y.o. female, DOB - 05/24/1981, YSA:[id number]  Admit date - 04/07/2023   Admitting Physician Frankey Shown, DO  Outpatient Primary MD for the patient is Patient, No Pcp Per  LOS - 0  Chief Complaint  Patient presents with   Shortness of Breath        Brief Narrative:  42 y.o. female with medical history significant of HFrEF, MVR, essential hypertension, depression with anxiety admitted on 04/07/2023 with acute on chronic systolic dysfunction CHF exacerbation in the setting of cocaine use    -Assessment and Plan: 1)HFrEF/acute on chronic systolic dysfunction CHF exacerbation in setting of cocaine abuse -Initially required BiPAP -Cardiology consult appreciated -Echo on 04/07/2023 with EF 60 to 65%  (up from 50% back in December 2023) -Mitral valve mechanical valve noted, prior tricuspid valve annuloplasty noted -Continue IV Lasix, daily weights and fluid input and output monitoring -Patient admits to noncompliance with medications prior to admission -Continue Aldactone 25 mg daily -Consider SGLT2 inhibitor --  2) valvular heart disease-- s/p  prior MV repair in 2014 with MV replacement in 03/2019 and also had TV repair with ring annuloplasty.  -Repeat echo as above #1 -Patient is noncompliant with Coumadin therapy -Lovenox bridge for now  3) paroxysmal atrial flutter--- -s/p TEE/DCCV 01/2023.   Coreg for rate control -Anticoagulation as above #2  4) acute hypoxic respiratory failure--due to 1 above -Initially required BiPAP -Currently on nasal cannula 3 L    5) tobacco abuse--smoking 6 advised, nicotine patch recommended  6) depression/anxiety----made worse by polysubstance abuse -Continue Zoloft and BuSpar  7)polysubstance abuse--- complete abstinence from cocaine strongly advised especially given cardiac issues -TOC gave referral to rehab programs  8)-Morbid Obesity- -Low calorie diet, portion control and increase physical  activity discussed with patient -Body mass index is 39.28 kg/m.  Patient seen and evaluated, chart reviewed, please see EMR for updated orders. Please see full H&P dictated by admitting physician Dr Thomes Dinning for same date of service.   Total care time 53 minutes  Status is: Inpatient   Disposition: The patient is from: Home              Anticipated d/c is to: Home              Anticipated d/c date is: 2 days              Patient currently is not medically stable to d/c. Barriers: Not Clinically Stable-   Code Status :  -  Code Status: Full Code   Family Communication:   NA (patient is alert, awake and coherent)   DVT Prophylaxis  :   - SCDs  SCDs Start: 04/07/23 0322   Lab Results  Component Value Date   PLT 316 04/06/2023    Inpatient Medications  Scheduled Meds:  aspirin EC  81 mg Oral Daily   busPIRone  7.5 mg Oral TID   carvedilol  3.125 mg Oral BID WC   Chlorhexidine Gluconate Cloth  6 each Topical Daily   enoxaparin (LOVENOX) injection  100 mg Subcutaneous BID   furosemide  40 mg Intravenous Q12H   nicotine  21 mg Transdermal Daily   mouth rinse  15 mL Mouth Rinse 4 times per day   sertraline  50 mg Oral Daily   spironolactone  25 mg Oral q AM   Warfarin - Physician Dosing Inpatient   Does not apply q1600   Continuous Infusions: PRN Meds:.acetaminophen **OR** acetaminophen, hydrOXYzine, mouth rinse,  prochlorperazine   Anti-infectives (From admission, onward)    None         Subjective: Theresa Crawford today has no fevers, no emesis,  No chest pain,   Dyspnea on exertion persist -Hypoxia improving  Patient seen and evaluated, chart reviewed, please see EMR for updated orders. Please see full H&P dictated by admitting physician Dr Thomes Dinning for same date of service.   Total care time 53 minutes  Objective: Vitals:   04/07/23 1600 04/07/23 1619 04/07/23 1700 04/07/23 1800  BP: 116/74  130/66 116/72  Pulse: 87  84 85  Resp: 19  19 17   Temp:  97.9 F  (36.6 C)    TempSrc:  Oral    SpO2: 95%  94% 97%  Weight:      Height:        Intake/Output Summary (Last 24 hours) at 04/07/2023 1841 Last data filed at 04/07/2023 1100 Gross per 24 hour  Intake 800 ml  Output 2650 ml  Net -1850 ml   Filed Weights   04/07/23 0353  Weight: 103.8 kg    Physical Exam  Gen:- Awake Alert, dyspnea on exertion, no significant conversational dyspnea HEENT:- [place].AT, No sclera icterus Neck-Supple Neck, +ve JVD,.  Lungs-faint bibasilar Rales, fair symmetrical air movement CV- S1, S2 normal, regular  Abd-  +ve B.Sounds, Abd Soft, No tenderness,    Extremity/Skin:- trace  edema, pedal pulses present  Psych-affect is anxious, oriented x3 Neuro-no new focal deficits, no tremors  Data Reviewed: I have personally reviewed following labs and imaging studies  CBC: Recent Labs  Lab 04/06/23 2350  WBC 10.2  HGB 12.0  HCT 37.9  MCV 83.5  PLT 316   Basic Metabolic Panel: Recent Labs  Lab 04/06/23 2350 04/07/23 0241  NA 137  --   K 3.9  --   CL 109  --   CO2 22  --   GLUCOSE 97  --   BUN 16  --   CREATININE 0.78  --   CALCIUM 8.7*  --   MG  --  1.9  PHOS  --  3.5   GFR: Estimated Creatinine Clearance: 106.4 mL/min (by C-G formula based on SCr of 0.78 mg/dL). Liver Function Tests: Recent Labs  Lab 04/06/23 2350  AST 19  ALT 14  ALKPHOS 50  BILITOT 0.4  PROT 7.8  ALBUMIN 3.7   Recent Results (from the past 240 hour(s))  MRSA Next Gen by PCR, Nasal     Status: None   Collection Time: 04/07/23  3:50 AM   Specimen: Nasal Mucosa; Nasal Swab  Result Value Ref Range Status   MRSA by PCR Next Gen NOT DETECTED NOT DETECTED Final    Comment: (NOTE) The GeneXpert MRSA Assay (FDA approved for NASAL specimens only), is one component of a comprehensive MRSA colonization surveillance program. It is not intended to diagnose MRSA infection nor to guide or monitor treatment for MRSA infections. Test performance is not FDA approved in patients  less than 78 years old. Performed at [facility], [address], [place], [place] [address]     Radiology Studies: ECHOCARDIOGRAM COMPLETE  Result Date: 04/07/2023    ECHOCARDIOGRAM REPORT   Patient Name:   Theresa Crawford Date of Exam: 04/07/2023 Medical Rec #:  [id number]       Height:       64.0 in Accession #:    [id number]      Weight:       228.8 lb Date of  Birth:  Jan 18, 1981       BSA:          2.072 m Patient Age:    42 years        BP:           120/42 mmHg Patient Gender: F               HR:           83 bpm. [name] Procedure: 2D Echo, Cardiac Doppler and Color Doppler Indications:    CHF-Acute Systolic I50.21  History:        Patient has prior history of Echocardiogram examinations, most                 recent 08/19/2022. Sepsis; Risk Factors:Non-Smoker.  Sonographer:    Aron Baba Referring Phys: [id number] OLADAPO ADEFESO  Sonographer Comments: Patient is obese. IMPRESSIONS  1. Left ventricular ejection fraction, by estimation, is 60 to 65%. The left ventricle has normal function. Left ventricular endocardial border not optimally defined to evaluate regional wall motion. Left ventricular diastolic parameters are indeterminate.  2. Right ventricular systolic function was not well visualized. The right ventricular size is not well visualized. Tricuspid regurgitation signal is inadequate for assessing PA pressure.  3. [employer] Optiform bileaflet mechanical valve size     29mm is in the MV position. Stable moderate gradient across the valve, mean gradient 8 mmHg at HR of 83 bpm.     . The mitral valve has been repaired/replaced. No evidence of mitral valve regurgitation. The mean mitral valve gradient is 8.0 mmHg.  4. 28 mm Edwards mc3 ring annuloplasty of the tricuspid valve. The tricuspid valve is has been repaired/replaced.  5. The aortic valve was not well visualized. Aortic valve regurgitation is not visualized. No aortic stenosis is present.  6. The inferior vena cava  is normal in size with greater than 50% respiratory variability, suggesting right atrial pressure of 3 mmHg. FINDINGS  Left Ventricle: LVOT VTI and velocities underestimated based on angle of acquisition. Left ventricular ejection fraction, by estimation, is 60 to 65%. The left ventricle has normal function. Left ventricular endocardial border not optimally defined to evaluate regional wall motion. The left ventricular internal cavity size was normal in size. There is no left ventricular hypertrophy. Left ventricular diastolic parameters are indeterminate. Right Ventricle: The right ventricular size is not well visualized. Right vetricular wall thickness was not well visualized. Right ventricular systolic function was not well visualized. Tricuspid regurgitation signal is inadequate for assessing PA pressure. Left Atrium: Left atrial size was not well visualized. Right Atrium: Right atrial size was not well visualized. Pericardium: There is no evidence of pericardial effusion. Mitral Valve: [employer] Optiform bileaflet mechanical valve size 29mm is in the MV position. Stable moderate gradient across the valve, mean gradient 8 mmHg at HR of 83 bpm. The mitral valve has been repaired/replaced. No evidence of mitral valve regurgitation. MV peak gradient, 17.3 mmHg. The mean mitral valve gradient is 8.0 mmHg. Tricuspid Valve: 28 mm Edwards mc3 ring annuloplasty of the tricuspid valve. The tricuspid valve is has been repaired/replaced. Tricuspid valve regurgitation is mild . No evidence of tricuspid stenosis. Aortic Valve: The aortic valve was not well visualized. Aortic valve regurgitation is not visualized. No aortic stenosis is present. Aortic valve mean gradient measures 3.4 mmHg. Aortic valve peak gradient measures 8.0 mmHg. Aortic valve area, by VTI measures 1.50 cm. Pulmonic Valve: The pulmonic valve was not well  visualized. Pulmonic valve regurgitation is mild. No evidence of pulmonic stenosis. Aorta:  The aortic root and ascending aorta are structurally normal, with no evidence of dilitation. Venous: The inferior vena cava is normal in size with greater than 50% respiratory variability, suggesting right atrial pressure of 3 mmHg. IAS/Shunts: The interatrial septum was not well visualized.  LEFT VENTRICLE PLAX 2D LVIDd:         5.00 cm   Diastology LVIDs:         3.00 cm   LV e' medial:    4.82 cm/s LV PW:         1.00 cm   LV E/e' medial:  36.5 LV IVS:        0.80 cm   LV e' lateral:   7.93 cm/s LVOT diam:     1.70 cm   LV E/e' lateral: 22.2 LV SV:         36 LV SV Index:   18 LVOT Area:     2.27 cm  RIGHT VENTRICLE RV S prime:     7.80 cm/s TAPSE (M-mode): 1.0 cm LEFT ATRIUM             Index        RIGHT ATRIUM           Index LA diam:        4.20 cm 2.03 cm/m   RA Area:     10.50 cm LA Vol (A2C):   37.6 ml 18.15 ml/m  RA Volume:   19.30 ml  9.32 ml/m LA Vol (A4C):   81.4 ml 39.29 ml/m LA Biplane Vol: 58.5 ml 28.24 ml/m  AORTIC VALVE                    PULMONIC VALVE AV Area (Vmax):    1.86 cm     PR End Diast Vel: 5.29 msec AV Area (Vmean):   2.27 cm AV Area (VTI):     1.50 cm AV Vmax:           141.60 cm/s AV Vmean:          84.282 cm/s AV VTI:            0.243 m AV Peak Grad:      8.0 mmHg AV Mean Grad:      3.4 mmHg LVOT Vmax:         116.00 cm/s LVOT Vmean:        84.300 cm/s LVOT VTI:          0.160 m LVOT/AV VTI ratio: 0.66  AORTA Ao Root diam: 2.90 cm Ao Asc diam:  3.00 cm MITRAL VALVE MV Area (PHT): 2.84 cm     SHUNTS MV Area VTI:   0.74 cm     Systemic VTI:  0.16 m MV Peak grad:  17.3 mmHg    Systemic Diam: 1.70 cm MV Mean grad:  8.0 mmHg MV Vmax:       2.08 m/s MV Vmean:      132.0 cm/s MV Decel Time: 267 msec MR Peak grad: 14.4 mmHg MR Vmax:      190.00 cm/s MV E velocity: 176.00 cm/s MV A velocity: 88.20 cm/s MV E/A ratio:  2.00 Dina Rich MD Electronically signed by Dina Rich MD Signature Date/Time: 04/07/2023/3:56:54 PM    Final    DG Chest Port 1 View  Result Date:  04/07/2023 CLINICAL DATA:  Chest pain and shortness of breath 3 days, initial encounter  EXAM: PORTABLE CHEST 1 VIEW COMPARISON:  08/18/2022 FINDINGS: Cardiac shadow is enlarged. Postsurgical changes are noted. Vascular congestion is seen with evidence of interstitial edema slightly increased when compare is a prior exam. No bony abnormality is noted. IMPRESSION: Changes of CHF. Electronically Signed   By: Alcide Clever M.D.   On: 04/07/2023 01:14     Scheduled Meds:  aspirin EC  81 mg Oral Daily   busPIRone  7.5 mg Oral TID   carvedilol  3.125 mg Oral BID WC   Chlorhexidine Gluconate Cloth  6 each Topical Daily   enoxaparin (LOVENOX) injection  100 mg Subcutaneous BID   furosemide  40 mg Intravenous Q12H   nicotine  21 mg Transdermal Daily   mouth rinse  15 mL Mouth Rinse 4 times per day   sertraline  50 mg Oral Daily   spironolactone  25 mg Oral q AM   Warfarin - Physician Dosing Inpatient   Does not apply q1600   Continuous Infusions:   LOS: 0 days    Shon Hale M.D on 04/07/2023 at 6:41 PM  Go to [email or web address] - for contact info  [facility] - Office  [phone]  If 7PM-7AM, please contact night-coverage [email or web address] 04/07/2023, 6:41 PM

## 2023-04-08 DIAGNOSIS — I5033 Acute on chronic diastolic (congestive) heart failure: Secondary | ICD-10-CM

## 2023-04-08 LAB — PHOSPHORUS: Phosphorus: 4 mg/dL (ref 2.5–4.6)

## 2023-04-08 MED ORDER — ORAL CARE MOUTH RINSE: 15.0000 mL | OROMUCOSAL | Status: DC | PRN

## 2023-04-08 MED ORDER — WARFARIN SODIUM 5 MG PO TABS
10.0000 mg | ORAL_TABLET | Freq: Once | ORAL | Status: AC
  Administered 2023-04-08: 10 mg via ORAL
  Filled 2023-04-08: qty 2

## 2023-04-08 MED ORDER — HYDROXYZINE HCL 25 MG PO TABS
50.0000 mg | ORAL_TABLET | Freq: Once | ORAL | Status: AC
  Administered 2023-04-08: 50 mg via ORAL
  Filled 2023-04-08: qty 2

## 2023-04-08 MED ORDER — WARFARIN - PHARMACIST DOSING INPATIENT: Freq: Every day | Status: DC

## 2023-04-08 MED ORDER — DAPAGLIFLOZIN PROPANEDIOL 10 MG PO TABS
10.0000 mg | ORAL_TABLET | Freq: Every day | ORAL | Status: DC
  Administered 2023-04-08 – 2023-04-10 (×3): 10 mg via ORAL
  Filled 2023-04-08 (×3): qty 1

## 2023-04-08 MED ORDER — MELATONIN 3 MG PO TABS
6.0000 mg | ORAL_TABLET | Freq: Once | ORAL | Status: AC
  Administered 2023-04-08: 6 mg via ORAL
  Filled 2023-04-08: qty 2

## 2023-04-08 MED ORDER — ZOLPIDEM TARTRATE 5 MG PO TABS
5.0000 mg | ORAL_TABLET | Freq: Every day | ORAL | Status: AC
  Administered 2023-04-08 – 2023-04-09 (×2): 5 mg via ORAL
  Filled 2023-04-08 (×2): qty 1

## 2023-04-08 MED ORDER — ZOLPIDEM TARTRATE 5 MG PO TABS: 10.0000 mg | ORAL_TABLET | Freq: Every day | ORAL | Status: DC

## 2023-04-08 NOTE — Progress Notes (Addendum)
Rounding Note    Patient Name: Theresa Crawford Date of Encounter: 04/08/2023  [facility] Cardiologist: Marjo Bicker, MD   Subjective   SOB improving but not resolved.   Inpatient Medications    Scheduled Meds:  aspirin EC  81 mg Oral Daily   busPIRone  7.5 mg Oral TID   carvedilol  3.125 mg Oral BID WC   Chlorhexidine Gluconate Cloth  6 each Topical Daily   enoxaparin (LOVENOX) injection  100 mg Subcutaneous BID   furosemide  40 mg Intravenous Q12H   nicotine  21 mg Transdermal Daily   sertraline  50 mg Oral Daily   spironolactone  25 mg Oral q AM   warfarin  10 mg Oral ONCE-1600   Warfarin - Pharmacist Dosing Inpatient   Does not apply q1600   Continuous Infusions:  PRN Meds: acetaminophen **OR** acetaminophen, hydrOXYzine, mouth rinse, mouth rinse, prochlorperazine   Vital Signs    Vitals:   04/08/23 0505 04/08/23 0600 04/08/23 0700 04/08/23 0815  BP:    (!) 146/59  Pulse:  80 78 81  Resp:  20 17 (!) 24  Temp: 98.1 F (36.7 C)   98.2 F (36.8 C)  TempSrc: Oral   Oral  SpO2:  95% 99% 95%  Weight: 109.1 kg     Height:        Intake/Output Summary (Last 24 hours) at 04/08/2023 0841 Last data filed at 04/08/2023 0800 Gross per 24 hour  Intake 840 ml  Output 1050 ml  Net -210 ml      04/08/2023    5:05 AM 04/07/2023    3:53 AM 08/24/2022    6:33 AM  Last 3 Weights  Weight (lbs) 240 lb 8.4 oz 228 lb 13.4 oz 228 lb 2.8 oz  Weight (kg) 109.1 kg 103.8 kg 103.5 kg      Telemetry    NSR, PACs - Personally Reviewed  ECG    N/a - Personally Reviewed  Physical Exam   GEN: No acute distress.   Neck: No JVD Cardiac: RRR, mechanical S1 Respiratory: Clear to auscultation bilaterally. GI: Soft, nontender, non-distended  MS: No edema; No deformity. Neuro:  Nonfocal  Psych: Normal affect   Labs    High Sensitivity Troponin:   Recent Labs  Lab 04/06/23 2350 04/07/23 0241  TROPONINIHS 11 10     Chemistry Recent Labs  Lab  04/06/23 2350 04/07/23 0241 04/08/23 0511  NA 137  --  138  K 3.9  --  3.7  CL 109  --  104  CO2 22  --  25  GLUCOSE 97  --  122*  BUN 16  --  22*  CREATININE 0.78  --  0.86  CALCIUM 8.7*  --  8.8*  MG  --  1.9  --   PROT 7.8  --  7.8  ALBUMIN 3.7  --  3.6  AST 19  --  23  ALT 14  --  15  ALKPHOS 50  --  56  BILITOT 0.4  --  0.7  GFRNONAA >60  --  >60  ANIONGAP 6  --  9    Lipids No results for input(s): "CHOL", "TRIG", "HDL", "LABVLDL", "LDLCALC", "CHOLHDL" in the last 168 hours.  Hematology Recent Labs  Lab 04/06/23 2350 04/08/23 0511  WBC 10.2 7.9  RBC 4.54 4.77  HGB 12.0 12.6  HCT 37.9 39.8  MCV 83.5 83.4  MCH 26.4 26.4  MCHC 31.7 31.7  RDW 17.3*  17.5*  PLT 316 338   Thyroid No results for input(s): "TSH", "FREET4" in the last 168 hours.  BNP Recent Labs  Lab 04/06/23 2350  BNP 285.0*    DDimer No results for input(s): "DDIMER" in the last 168 hours.   Radiology    ECHOCARDIOGRAM COMPLETE  Result Date: 04/07/2023    ECHOCARDIOGRAM REPORT   Patient Name:   Theresa Crawford Date of Exam: 04/07/2023 Medical Rec #:  [id number]       Height:       64.0 in Accession #:    [id number]      Weight:       228.8 lb Date of Birth:  1981/06/02       BSA:          2.072 m Patient Age:    42 years        BP:           120/42 mmHg Patient Gender: F               HR:           83 bpm. [name] Procedure: 2D Echo, Cardiac Doppler and Color Doppler Indications:    CHF-Acute Systolic I50.21  History:        Patient has prior history of Echocardiogram examinations, most                 recent 08/19/2022. Sepsis; Risk Factors:Non-Smoker.  Sonographer:    Aron Baba Referring Phys: [id number] OLADAPO ADEFESO  Sonographer Comments: Patient is obese. IMPRESSIONS  1. Left ventricular ejection fraction, by estimation, is 60 to 65%. The left ventricle has normal function. Left ventricular endocardial border not optimally defined to evaluate regional wall motion. Left  ventricular diastolic parameters are indeterminate.  2. Right ventricular systolic function was not well visualized. The right ventricular size is not well visualized. Tricuspid regurgitation signal is inadequate for assessing PA pressure.  3. [employer] Optiform bileaflet mechanical valve size     29mm is in the MV position. Stable moderate gradient across the valve, mean gradient 8 mmHg at HR of 83 bpm.     . The mitral valve has been repaired/replaced. No evidence of mitral valve regurgitation. The mean mitral valve gradient is 8.0 mmHg.  4. 28 mm Edwards mc3 ring annuloplasty of the tricuspid valve. The tricuspid valve is has been repaired/replaced.  5. The aortic valve was not well visualized. Aortic valve regurgitation is not visualized. No aortic stenosis is present.  6. The inferior vena cava is normal in size with greater than 50% respiratory variability, suggesting right atrial pressure of 3 mmHg. FINDINGS  Left Ventricle: LVOT VTI and velocities underestimated based on angle of acquisition. Left ventricular ejection fraction, by estimation, is 60 to 65%. The left ventricle has normal function. Left ventricular endocardial border not optimally defined to evaluate regional wall motion. The left ventricular internal cavity size was normal in size. There is no left ventricular hypertrophy. Left ventricular diastolic parameters are indeterminate. Right Ventricle: The right ventricular size is not well visualized. Right vetricular wall thickness was not well visualized. Right ventricular systolic function was not well visualized. Tricuspid regurgitation signal is inadequate for assessing PA pressure. Left Atrium: Left atrial size was not well visualized. Right Atrium: Right atrial size was not well visualized. Pericardium: There is no evidence of pericardial effusion. Mitral Valve: [employer] Optiform bileaflet mechanical valve size 29mm is in the MV position. Stable moderate gradient across  the  valve, mean gradient 8 mmHg at HR of 83 bpm. The mitral valve has been repaired/replaced. No evidence of mitral valve regurgitation. MV peak gradient, 17.3 mmHg. The mean mitral valve gradient is 8.0 mmHg. Tricuspid Valve: 28 mm Edwards mc3 ring annuloplasty of the tricuspid valve. The tricuspid valve is has been repaired/replaced. Tricuspid valve regurgitation is mild . No evidence of tricuspid stenosis. Aortic Valve: The aortic valve was not well visualized. Aortic valve regurgitation is not visualized. No aortic stenosis is present. Aortic valve mean gradient measures 3.4 mmHg. Aortic valve peak gradient measures 8.0 mmHg. Aortic valve area, by VTI measures 1.50 cm. Pulmonic Valve: The pulmonic valve was not well visualized. Pulmonic valve regurgitation is mild. No evidence of pulmonic stenosis. Aorta: The aortic root and ascending aorta are structurally normal, with no evidence of dilitation. Venous: The inferior vena cava is normal in size with greater than 50% respiratory variability, suggesting right atrial pressure of 3 mmHg. IAS/Shunts: The interatrial septum was not well visualized.  LEFT VENTRICLE PLAX 2D LVIDd:         5.00 cm   Diastology LVIDs:         3.00 cm   LV e' medial:    4.82 cm/s LV PW:         1.00 cm   LV E/e' medial:  36.5 LV IVS:        0.80 cm   LV e' lateral:   7.93 cm/s LVOT diam:     1.70 cm   LV E/e' lateral: 22.2 LV SV:         36 LV SV Index:   18 LVOT Area:     2.27 cm  RIGHT VENTRICLE RV S prime:     7.80 cm/s TAPSE (M-mode): 1.0 cm LEFT ATRIUM             Index        RIGHT ATRIUM           Index LA diam:        4.20 cm 2.03 cm/m   RA Area:     10.50 cm LA Vol (A2C):   37.6 ml 18.15 ml/m  RA Volume:   19.30 ml  9.32 ml/m LA Vol (A4C):   81.4 ml 39.29 ml/m LA Biplane Vol: 58.5 ml 28.24 ml/m  AORTIC VALVE                    PULMONIC VALVE AV Area (Vmax):    1.86 cm     PR End Diast Vel: 5.29 msec AV Area (Vmean):   2.27 cm AV Area (VTI):     1.50 cm AV Vmax:            141.60 cm/s AV Vmean:          84.282 cm/s AV VTI:            0.243 m AV Peak Grad:      8.0 mmHg AV Mean Grad:      3.4 mmHg LVOT Vmax:         116.00 cm/s LVOT Vmean:        84.300 cm/s LVOT VTI:          0.160 m LVOT/AV VTI ratio: 0.66  AORTA Ao Root diam: 2.90 cm Ao Asc diam:  3.00 cm MITRAL VALVE MV Area (PHT): 2.84 cm     SHUNTS MV Area VTI:   0.74 cm     Systemic VTI:  0.16 m MV Peak grad:  17.3 mmHg    Systemic Diam: 1.70 cm MV Mean grad:  8.0 mmHg MV Vmax:       2.08 m/s MV Vmean:      132.0 cm/s MV Decel Time: 267 msec MR Peak grad: 14.4 mmHg MR Vmax:      190.00 cm/s MV E velocity: 176.00 cm/s MV A velocity: 88.20 cm/s MV E/A ratio:  2.00 Dina Rich MD Electronically signed by Dina Rich MD Signature Date/Time: 04/07/2023/3:56:54 PM    Final    DG Chest Port 1 View  Result Date: 04/07/2023 CLINICAL DATA:  Chest pain and shortness of breath 3 days, initial encounter EXAM: PORTABLE CHEST 1 VIEW COMPARISON:  08/18/2022 FINDINGS: Cardiac shadow is enlarged. Postsurgical changes are noted. Vascular congestion is seen with evidence of interstitial edema slightly increased when compare is a prior exam. No bony abnormality is noted. IMPRESSION: Changes of CHF. Electronically Signed   By: Alcide Clever M.D.   On: 04/07/2023 01:14    Cardiac Studies     Patient Profile     Theresa Crawford is a 42 y.o. female with a hx of HFpEF (EF 50-55% in 01/2022, at 50% by echo in 08/2022), CAD (mild non-obstructive CAD by cath in 02/2019), mechanical mitral valve (prior repair in 2014 with MV replacement in 03/2019), history of TV repair with ring annuloplasty, pulmonary HTN, persistent atrial flutter (s/p TEE/DCCV in 01/2022), substance abuse (cocaine use) and suspected systemic sarcoidosis who is being seen 04/07/2023 for the evaluation of CHF at the request of Dr. Thomes Dinning.   Assessment & Plan    1.Acute on chronic HFpEF -08/2022 echo: LVEF 50%, D shaped septum is systole, mild RV dysfunction -  03/2023: LVE 60-65%, indet diastolic function, RV not well visualized, MVR with stable mean grad 8mm Hg.  - CXR with signs of HF, BNP 284. Initially on bipap   - I/Os incomplete, available data indicated negative 2.3 L since admission. Weights appear inaccurate. She is on IV lasix 40mg  bid, renal function is stable. Check reds vest.  - mildly elevate JVD, some DOE with washing up this morning. Continue IV diuresis today, anticipate likely changing to oral diuretic tomorrow, could transition to oral lasix 80mg  daily.  - start farxiga 10mg  daily in setting of HFpEF     2.Mechanical MV - INR 1.1 on admission - she has been started on therapeutic lovenox, remains on coumadin per pharmacy dosing.  - continue lovenox until INR therapeutic - with mechanical valve should be on ASA as well - echo with normal working MVR, stable mean 8 mmHg.      3. Substance abuse - cocaine + this admission and severel prior admit     4. Aflutter - s/p TEE/DCCV 01/2023.  - EKG SR this admit - continue coumadin for stroke prevention in setting of aflutter and mechanical MV - start back her coreg, lower dose given bp's 110s, start 3.125mg  bid - INR 1.1, if not therapeutic by discharge would need lovenox bridge.     For questions or updates, please contact [facility] Please consult [email or web address] for contact info under        Signed, Dina Rich, MD  04/08/2023, 8:41 AM

## 2023-04-08 NOTE — Progress Notes (Signed)
   04/08/23 0800  ReDS Vest / Clip  Station Marker B  Ruler Value 35  ReDS Value Range (!) > 40  ReDS Actual Value 56

## 2023-04-08 NOTE — Progress Notes (Signed)
ANTICOAGULATION CONSULT NOTE -   Pharmacy Consult for Lovenox/warfarin Indication: mechanical mitral valve  Allergies  Allergen Reactions   Bee Venom Anaphylaxis   Lisinopril Anaphylaxis, Shortness Of Breath, Swelling and Other (See Comments)    Throat swells   Penicillins Shortness Of Breath, Swelling and Other (See Comments)    Has patient had a PCN reaction causing immediate rash, facial/tongue/throat swelling, SOB or lightheadedness with hypotension: Yes Has patient had a PCN reaction causing severe rash involving mucus membranes or skin necrosis: Yes Has patient had a PCN reaction that required hospitalization No Has patient had a PCN reaction occurring within the last 10 years: No If all of the above answers are "NO", then may proceed with Cephalosporin use.    Perflutren Lipid Microsphere Shortness Of Breath and Other (See Comments)    Chest Pain/Tightness, also   Shellfish Allergy Anaphylaxis   Contrast Media [Iodinated Contrast Media] Hives, Itching and Other (See Comments)    Itching and hives to throat, neck, face and arms.   No difficulty breathing.   This was given at [facility] approximately 2015. Contrast Dye    Patient Measurements: Height: 5\' 4"  (162.6 cm) Weight: 109.1 kg (240 lb 8.4 oz) IBW/kg (Calculated) : 54.7  Vital Signs: Temp: 98.1 F (36.7 C) (07/26 0505) Temp Source: Oral (07/26 0505) BP: 132/79 (07/26 0400) Pulse Rate: 78 (07/26 0700)  Labs: Recent Labs    04/06/23 2350 04/07/23 0241 04/07/23 0449 04/08/23 0511  HGB 12.0  --   --  12.6  HCT 37.9  --   --  39.8  PLT 316  --   --  338  LABPROT 14.4  --  14.5 14.5  INR 1.1  --  1.1 1.1  CREATININE 0.78  --   --  0.86  TROPONINIHS 11 10  --   --     Estimated Creatinine Clearance: 101.9 mL/min (by C-G formula based on SCr of 0.86 mg/dL).   Medical History: Past Medical History:  Diagnosis Date   Anxiety    Arthritis    "lower back" (04/04/2018)   Bipolar disorder  (HCC)    Chronic bronchitis (HCC)    Chronic diastolic CHF (congestive heart failure) (HCC)    Chronic lower back pain    DDD (degenerative disc disease), lumbar    Depression    Dyspnea    GERD (gastroesophageal reflux disease)    Headache    "weekly" (04/04/2018), miragrains in past (04/03/2019)   Heart murmur    Hypertension    Mitral valve disease    Normocytic anemia    Obesity    Palpitations    Pericarditis    Pneumonia    Premature atrial contractions    Pulmonary hypertension (HCC)    PVC's (premature ventricular contractions)    a. h/o palpitations with event monitor in 03/2017 showing NSR with PACs/PVCs.   Recurrent mitral valve stenosis and regurgitation s/p mitral valve repair    S/P mitral valve repair 03/27/2013   Dr. Darcus Austin - Natale Milch, PA - complex valvuloplasty including resuspension of entire posterior leaflet using Gore-tex neochords and 30 mm Edwards Physio ring annuloplasty   S/P redo mitral valve replacement with mechanical valve 04/05/2019   29 mm Sorin Carbomedics Optiform bileaflet mechanical valve   S/P tricuspid valve repair 04/05/2019   28 mm Edwards mc3 ring annuloplasty   Tobacco abuse    Tricuspid regurgitation     Medications:  No current facility-administered medications on file prior to encounter.  Current Outpatient Medications on File Prior to Encounter  Medication Sig Dispense Refill   acetaminophen (TYLENOL) 325 MG tablet Take 2 tablets (650 mg total) by mouth every 6 (six) hours as needed for mild pain.     albuterol (VENTOLIN HFA) 108 (90 Base) MCG/ACT inhaler Inhale 2 puffs into the lungs every 6 (six) hours as needed for wheezing or shortness of breath. 18 g 2   busPIRone (BUSPAR) 7.5 MG tablet Take 1 tablet (7.5 mg total) by mouth 3 (three) times daily. 90 tablet 3   carvedilol (COREG) 12.5 MG tablet Take 1 tablet (12.5 mg total) by mouth 2 (two) times daily with a meal. 60 tablet 2   Cholecalciferol (VITAMIN D-3 PO) Take 1  capsule by mouth daily.     ferrous sulfate 325 (65 FE) MG tablet Take 1 tablet (325 mg total) by mouth daily with breakfast. 90 tablet 3   folic acid (FOLVITE) 400 MCG tablet Take 400 mcg by mouth daily.     furosemide (LASIX) 20 MG tablet Take 3 tablets (60 mg total) by mouth daily. 90 tablet 3   Omega-3 Fatty Acids (OMEGA-3 FISH OIL PO) Take 1 capsule by mouth daily at 6 (six) AM.     potassium chloride SA (KLOR-CON M) 20 MEQ tablet Take 40 mEq by mouth daily.     sertraline (ZOLOFT) 50 MG tablet Take 1 tablet (50 mg total) by mouth daily. 30 tablet 2   spironolactone (ALDACTONE) 25 MG tablet Take 1 tablet (25 mg total) by mouth in the morning. 30 tablet 3   traZODone (DESYREL) 50 MG tablet Take 1 tablet (50 mg total) by mouth at bedtime. For sleep and mood 30 tablet 3   vitamin B-12 (CYANOCOBALAMIN) 1000 MCG tablet Take 1,000 mcg by mouth daily.     warfarin (COUMADIN) 5 MG tablet Take 1.5 tablets (7.5 mg total) by mouth one time only at 4 PM. 45 tablet 2   hydrOXYzine (VISTARIL) 25 MG capsule Take 1 capsule (25 mg total) by mouth every 8 (eight) hours as needed for anxiety or itching. (Patient not taking: Reported on 04/07/2023) 30 capsule 0   nicotine (NICODERM CQ - DOSED IN MG/24 HOURS) 21 mg/24hr patch Place 1 patch (21 mg total) onto the skin daily. (Patient not taking: Reported on 04/07/2023) 28 patch 0     Assessment: 42 y.o. female with PMH VHD s/p MVR in 2020. HFpEF, CAD, pulm HTN, persistent Afib/flutter s/p DCCV in 2023 presenting with SOB in the setting of cocaine use and CHF and Afib. INR is subtherapeutic at 1.1. Last dose of warfarin was 7/24 per med rec.  Meds: Warfarin (home regimen is 7.5 mg daily) Lovenox 100 mg IV q12H while bridging  Goal of Therapy:  INR 2.5-3.5 Monitor platelets by anticoagulation protocol: Yes   Plan:  Give warfarin 10 mg PO x 1 Continue enoxaparin 100 mg IV q12H while INR subtherapeutic CBC and INR daily while on warfarin and  enoxaparin  Judeth Cornfield, PharmD Clinical Pharmacist 04/08/2023 8:01 AM

## 2023-04-08 NOTE — Progress Notes (Signed)
Report given to [facility], LPN on Dept. [id number]. Pt to be wheeled via WC to room 325.

## 2023-04-08 NOTE — Progress Notes (Signed)
PROGRESS NOTE  Theresa Crawford, is a 42 y.o. female, DOB - October 27, 1980, NWG:[id number]  Admit date - 04/07/2023   Admitting Physician Frankey Shown, DO  Outpatient Primary MD for the patient is Patient, No Pcp Per  LOS - 1  Chief Complaint  Patient presents with   Shortness of Breath        Brief Narrative:  42 y.o. female with medical history significant of HFrEF, MVR, essential hypertension, depression with anxiety admitted on 04/07/2023 with acute on chronic systolic dysfunction CHF exacerbation in the setting of cocaine use    -Assessment and Plan: 1)HFrEF/acute on chronic systolic dysfunction CHF exacerbation in setting of cocaine abuse -Initially required BiPAP -Cardiology consult appreciated -Echo on 04/07/2023 with EF 60 to 65%  (up from 50% back in December 2023) -Mitral valve mechanical valve noted, prior tricuspid valve annuloplasty noted -Continue IV Lasix, daily weights and fluid input and output monitoring -Patient admits to noncompliance with medications prior to admission -Continue Aldactone 25 mg daily -Consider SGLT2 inhibitor -- -04/08/23 -Diuresing okay -Some dyspnea on exertion persist -Possible discharge home in 24 hours if continues to improve and diurese well  2) valvular heart disease-- s/p  prior MV repair in 2014 with MV replacement in 03/2019 and also had TV repair with ring annuloplasty.  -Repeat echo as above #1 -Patient is noncompliant with Coumadin therapy -Lovenox bridge for now -Anticipate discharge home on Lovenox bridge  3) paroxysmal atrial flutter--- -s/p TEE/DCCV 01/2023.   Coreg for rate control -Anticoagulation as above #2  4) acute hypoxic respiratory failure--due to 1 above -Initially required BiPAP -Weaning off of 2 nicely  5) tobacco abuse--smoking cessation advised, nicotine patch recommended  6) depression/anxiety----made worse by polysubstance abuse -Continue Zoloft and BuSpar  7)polysubstance abuse--- complete  abstinence from cocaine strongly advised especially given cardiac issues -TOC gave referral to rehab programs  8)-Morbid Obesity- -Low calorie diet, portion control and increase physical activity discussed with patient -Body mass index is 41.29 kg/m.  Status is: Inpatient   Disposition: The patient is from: Home              Anticipated d/c is to: Home              Anticipated d/c date is: 2 days              Patient currently is not medically stable to d/c. Barriers: Not Clinically Stable-   Code Status :  -  Code Status: Full Code   Family Communication:   NA (patient is alert, awake and coherent)   DVT Prophylaxis  :   - SCDs  SCDs Start: 04/07/23 0322   Lab Results  Component Value Date   PLT 338 04/08/2023   Inpatient Medications  Scheduled Meds:  aspirin EC  81 mg Oral Daily   busPIRone  7.5 mg Oral TID   carvedilol  3.125 mg Oral BID WC   Chlorhexidine Gluconate Cloth  6 each Topical Daily   dapagliflozin propanediol  10 mg Oral Daily   enoxaparin (LOVENOX) injection  100 mg Subcutaneous BID   furosemide  40 mg Intravenous Q12H   nicotine  21 mg Transdermal Daily   sertraline  50 mg Oral Daily   spironolactone  25 mg Oral q AM   Warfarin - Pharmacist Dosing Inpatient   Does not apply q1600   Continuous Infusions: PRN Meds:.acetaminophen **OR** acetaminophen, hydrOXYzine, mouth rinse, mouth rinse, prochlorperazine   Anti-infectives (From admission, onward)    None  Subjective: Theresa Crawford today has no fevers, no emesis,  No chest pain,    -Some dyspnea on exertion persist -Voiding well -Possible discharge home in 24 hours if continues to improve and diurese well   Objective: Vitals:   04/08/23 1400 04/08/23 1420 04/08/23 1446 04/08/23 1514  BP:  121/72  (!) 155/77  Pulse: 85 (!) 43 86 93  Resp: (!) 23 17 20 18   Temp:    98.1 F (36.7 C)  TempSrc:    Oral  SpO2: 98% 99% 95% 98%  Weight:      Height:        Intake/Output Summary  (Last 24 hours) at 04/08/2023 1935 Last data filed at 04/08/2023 1700 Gross per 24 hour  Intake 780 ml  Output 2300 ml  Net -1520 ml   Filed Weights   04/07/23 0353 04/08/23 0505  Weight: 103.8 kg 109.1 kg    Physical Exam  Gen:- Awake Alert, dyspnea on exertion persist, no significant conversational dyspnea HEENT:- [place].AT, No sclera icterus Neck-Supple Neck, +ve JVD,.  Lungs-faint bibasilar Rales, fair symmetrical air movement CV- S1, S2 normal, regular  Abd-  +ve B.Sounds, Abd Soft, No tenderness,    Extremity/Skin:- trace  edema, pedal pulses present  Psych-affect is anxious, oriented x3 Neuro-no new focal deficits, no tremors  Data Reviewed: I have personally reviewed following labs and imaging studies  CBC: Recent Labs  Lab 04/06/23 2350 04/08/23 0511  WBC 10.2 7.9  HGB 12.0 12.6  HCT 37.9 39.8  MCV 83.5 83.4  PLT 316 338   Basic Metabolic Panel: Recent Labs  Lab 04/06/23 2350 04/07/23 0241 04/08/23 0511  NA 137  --  138  K 3.9  --  3.7  CL 109  --  104  CO2 22  --  25  GLUCOSE 97  --  122*  BUN 16  --  22*  CREATININE 0.78  --  0.86  CALCIUM 8.7*  --  8.8*  MG  --  1.9  --   PHOS  --  3.5 4.0   GFR: Estimated Creatinine Clearance: 101.9 mL/min (by C-G formula based on SCr of 0.86 mg/dL). Liver Function Tests: Recent Labs  Lab 04/06/23 2350 04/08/23 0511  AST 19 23  ALT 14 15  ALKPHOS 50 56  BILITOT 0.4 0.7  PROT 7.8 7.8  ALBUMIN 3.7 3.6   Recent Results (from the past 240 hour(s))  MRSA Next Gen by PCR, Nasal     Status: None   Collection Time: 04/07/23  3:50 AM   Specimen: Nasal Mucosa; Nasal Swab  Result Value Ref Range Status   MRSA by PCR Next Gen NOT DETECTED NOT DETECTED Final    Comment: (NOTE) The GeneXpert MRSA Assay (FDA approved for NASAL specimens only), is one component of a comprehensive MRSA colonization surveillance program. It is not intended to diagnose MRSA infection nor to guide or monitor treatment for MRSA  infections. Test performance is not FDA approved in patients less than 67 years old. Performed at [facility], [address], [place], [place] [address]     Radiology Studies: ECHOCARDIOGRAM COMPLETE  Result Date: 04/07/2023    ECHOCARDIOGRAM REPORT   Patient Name:   Theresa Crawford Date of Exam: 04/07/2023 Medical Rec #:  [id number]       Height:       64.0 in Accession #:    [id number]      Weight:       228.8 lb Date of Birth:  1980-11-08       BSA:          2.072 m Patient Age:    42 years        BP:           120/42 mmHg Patient Gender: F               HR:           83 bpm. [name] Procedure: 2D Echo, Cardiac Doppler and Color Doppler Indications:    CHF-Acute Systolic I50.21  History:        Patient has prior history of Echocardiogram examinations, most                 recent 08/19/2022. Sepsis; Risk Factors:Non-Smoker.  Sonographer:    Aron Baba Referring Phys: [id number] OLADAPO ADEFESO  Sonographer Comments: Patient is obese. IMPRESSIONS  1. Left ventricular ejection fraction, by estimation, is 60 to 65%. The left ventricle has normal function. Left ventricular endocardial border not optimally defined to evaluate regional wall motion. Left ventricular diastolic parameters are indeterminate.  2. Right ventricular systolic function was not well visualized. The right ventricular size is not well visualized. Tricuspid regurgitation signal is inadequate for assessing PA pressure.  3. Sorin Carbomedics Optiform bileaflet mechanical valve size     29mm is in the MV position. Stable moderate gradient across the valve, mean gradient 8 mmHg at HR of 83 bpm.     . The mitral valve has been repaired/replaced. No evidence of mitral valve regurgitation. The mean mitral valve gradient is 8.0 mmHg.  4. 28 mm Edwards mc3 ring annuloplasty of the tricuspid valve. The tricuspid valve is has been repaired/replaced.  5. The aortic valve was not well visualized. Aortic valve regurgitation is not  visualized. No aortic stenosis is present.  6. The inferior vena cava is normal in size with greater than 50% respiratory variability, suggesting right atrial pressure of 3 mmHg. FINDINGS  Left Ventricle: LVOT VTI and velocities underestimated based on angle of acquisition. Left ventricular ejection fraction, by estimation, is 60 to 65%. The left ventricle has normal function. Left ventricular endocardial border not optimally defined to evaluate regional wall motion. The left ventricular internal cavity size was normal in size. There is no left ventricular hypertrophy. Left ventricular diastolic parameters are indeterminate. Right Ventricle: The right ventricular size is not well visualized. Right vetricular wall thickness was not well visualized. Right ventricular systolic function was not well visualized. Tricuspid regurgitation signal is inadequate for assessing PA pressure. Left Atrium: Left atrial size was not well visualized. Right Atrium: Right atrial size was not well visualized. Pericardium: There is no evidence of pericardial effusion. Mitral Valve: Sorin Carbomedics Optiform bileaflet mechanical valve size 29mm is in the MV position. Stable moderate gradient across the valve, mean gradient 8 mmHg at HR of 83 bpm. The mitral valve has been repaired/replaced. No evidence of mitral valve regurgitation. MV peak gradient, 17.3 mmHg. The mean mitral valve gradient is 8.0 mmHg. Tricuspid Valve: 28 mm Edwards mc3 ring annuloplasty of the tricuspid valve. The tricuspid valve is has been repaired/replaced. Tricuspid valve regurgitation is mild . No evidence of tricuspid stenosis. Aortic Valve: The aortic valve was not well visualized. Aortic valve regurgitation is not visualized. No aortic stenosis is present. Aortic valve mean gradient measures 3.4 mmHg. Aortic valve peak gradient measures 8.0 mmHg. Aortic valve area, by VTI measures 1.50 cm. Pulmonic Valve: The pulmonic valve was not well visualized. Pulmonic  valve regurgitation is mild. No evidence of pulmonic stenosis. Aorta: The aortic root and ascending aorta are structurally normal, with no evidence of dilitation. Venous: The inferior vena cava is normal in size with greater than 50% respiratory variability, suggesting right atrial pressure of 3 mmHg. IAS/Shunts: The interatrial septum was not well visualized.  LEFT VENTRICLE PLAX 2D LVIDd:         5.00 cm   Diastology LVIDs:         3.00 cm   LV e' medial:    4.82 cm/s LV PW:         1.00 cm   LV E/e' medial:  36.5 LV IVS:        0.80 cm   LV e' lateral:   7.93 cm/s LVOT diam:     1.70 cm   LV E/e' lateral: 22.2 LV SV:         36 LV SV Index:   18 LVOT Area:     2.27 cm  RIGHT VENTRICLE RV S prime:     7.80 cm/s TAPSE (M-mode): 1.0 cm LEFT ATRIUM             Index        RIGHT ATRIUM           Index LA diam:        4.20 cm 2.03 cm/m   RA Area:     10.50 cm LA Vol (A2C):   37.6 ml 18.15 ml/m  RA Volume:   19.30 ml  9.32 ml/m LA Vol (A4C):   81.4 ml 39.29 ml/m LA Biplane Vol: 58.5 ml 28.24 ml/m  AORTIC VALVE                    PULMONIC VALVE AV Area (Vmax):    1.86 cm     PR End Diast Vel: 5.29 msec AV Area (Vmean):   2.27 cm AV Area (VTI):     1.50 cm AV Vmax:           141.60 cm/s AV Vmean:          84.282 cm/s AV VTI:            0.243 m AV Peak Grad:      8.0 mmHg AV Mean Grad:      3.4 mmHg LVOT Vmax:         116.00 cm/s LVOT Vmean:        84.300 cm/s LVOT VTI:          0.160 m LVOT/AV VTI ratio: 0.66  AORTA Ao Root diam: 2.90 cm Ao Asc diam:  3.00 cm MITRAL VALVE MV Area (PHT): 2.84 cm     SHUNTS MV Area VTI:   0.74 cm     Systemic VTI:  0.16 m MV Peak grad:  17.3 mmHg    Systemic Diam: 1.70 cm MV Mean grad:  8.0 mmHg MV Vmax:       2.08 m/s MV Vmean:      132.0 cm/s MV Decel Time: 267 msec MR Peak grad: 14.4 mmHg MR Vmax:      190.00 cm/s MV E velocity: 176.00 cm/s MV A velocity: 88.20 cm/s MV E/A ratio:  2.00 Dina Rich MD Electronically signed by Dina Rich MD Signature Date/Time:  04/07/2023/3:56:54 PM    Final    DG Chest Port 1 View  Result Date: 04/07/2023 CLINICAL DATA:  Chest pain and shortness of breath 3 days, initial encounter EXAM: PORTABLE CHEST  1 VIEW COMPARISON:  08/18/2022 FINDINGS: Cardiac shadow is enlarged. Postsurgical changes are noted. Vascular congestion is seen with evidence of interstitial edema slightly increased when compare is a prior exam. No bony abnormality is noted. IMPRESSION: Changes of CHF. Electronically Signed   By: Alcide Clever M.D.   On: 04/07/2023 01:14     Scheduled Meds:  aspirin EC  81 mg Oral Daily   busPIRone  7.5 mg Oral TID   carvedilol  3.125 mg Oral BID WC   Chlorhexidine Gluconate Cloth  6 each Topical Daily   dapagliflozin propanediol  10 mg Oral Daily   enoxaparin (LOVENOX) injection  100 mg Subcutaneous BID   furosemide  40 mg Intravenous Q12H   nicotine  21 mg Transdermal Daily   sertraline  50 mg Oral Daily   spironolactone  25 mg Oral q AM   Warfarin - Pharmacist Dosing Inpatient   Does not apply q1600   Continuous Infusions:   LOS: 1 day    Shon Hale M.D on 04/08/2023 at 7:35 PM  Go to [email or web address] - for contact info  [facility] - Office  [phone]  If 7PM-7AM, please contact night-coverage [email or web address] 04/08/2023, 7:35 PM

## 2023-04-08 NOTE — Progress Notes (Signed)
Patient continues to do well of BIPAP and a unit is on standby if needed.

## 2023-04-08 NOTE — Plan of Care (Signed)

## 2023-04-09 MED ORDER — WARFARIN SODIUM 7.5 MG PO TABS
12.5000 mg | ORAL_TABLET | Freq: Once | ORAL | Status: AC
  Administered 2023-04-09: 12.5 mg via ORAL
  Filled 2023-04-09: qty 1

## 2023-04-09 MED ORDER — WARFARIN SODIUM 5 MG PO TABS: 10.0000 mg | ORAL_TABLET | Freq: Once | ORAL | Status: DC

## 2023-04-09 MED ORDER — FUROSEMIDE 10 MG/ML IJ SOLN
60.0000 mg | Freq: Two times a day (BID) | INTRAMUSCULAR | Status: DC
  Administered 2023-04-09 – 2023-04-10 (×2): 60 mg via INTRAVENOUS
  Filled 2023-04-09 (×2): qty 6

## 2023-04-09 NOTE — Progress Notes (Signed)
PROGRESS NOTE  Theresa Crawford, is a 42 y.o. female, DOB - December 20, 1980, GUY:[id number]  Admit date - 04/07/2023   Admitting Physician Frankey Shown, DO  Outpatient Primary MD for the patient is Patient, No Pcp Per  LOS - 2  Chief Complaint  Patient presents with   Shortness of Breath      Brief Narrative:  42 y.o. female with medical history significant of HFrEF, MVR, essential hypertension, depression with anxiety admitted on 04/07/2023 with acute on chronic systolic dysfunction CHF exacerbation in the setting of cocaine use    -Assessment and Plan: 1)HFrEF/acute on chronic systolic dysfunction CHF exacerbation in setting of cocaine abuse -Initially required BiPAP -Cardiology consult appreciated -Echo on 04/07/2023 with EF 60 to 65%  (up from 50% back in December 2023) -Mitral valve mechanical valve noted, prior tricuspid valve annuloplasty noted -Continue IV Lasix, daily weights and fluid input and output monitoring -Patient admits to noncompliance with medications prior to admission -Continue Aldactone 25 mg daily -C/n Farxiga-- -04/09/23 --Wt 240 .52 >>239.6  -Not diuresing as wel -Increase Lasix to 60 mg twice daily-to achieve better diuresis  2) valvular heart disease-- s/p  prior MV repair in 2014 with MV replacement in 03/2019 and also had TV repair with ring annuloplasty.  -Repeat echo as above #1 -INR remains stable and low at 1.1, despite Increasing Coumadin dosage -Lovenox bridge for now -Anticipate discharge home on Lovenox bridge  3) paroxysmal atrial flutter--- -s/p TEE/DCCV 01/2023.   Coreg for rate control -Anticoagulation as above #2  4) acute hypoxic respiratory failure--due to 1 above -Initially required BiPAP 04/09/23 --Weaned off BiPAP continues to require oxygen with activity but not at rest  5) tobacco abuse--smoking cessation advised, nicotine patch recommended  6) depression/anxiety----made worse by polysubstance abuse -Continue Zoloft and  BuSpar  7)polysubstance abuse--- complete abstinence from cocaine strongly advised especially given cardiac issues -TOC gave referral to rehab programs  8)-Morbid Obesity- -Low calorie diet, portion control and increase physical activity discussed with patient -Body mass index is 41.13 kg/m.  Status is: Inpatient   Disposition: The patient is from: Home              Anticipated d/c is to: Home              Anticipated d/c date is: 1 day              Patient currently is not medically stable to d/c. Barriers: Not Clinically Stable-   Code Status :  -  Code Status: Full Code   Family Communication:   NA (patient is alert, awake and coherent)   DVT Prophylaxis  :   - SCDs  SCDs Start: 04/07/23 0322 warfarin (COUMADIN) tablet 12.5 mg   Lab Results  Component Value Date   PLT 338 04/08/2023   Inpatient Medications  Scheduled Meds:  aspirin EC  81 mg Oral Daily   busPIRone  7.5 mg Oral TID   carvedilol  3.125 mg Oral BID WC   dapagliflozin propanediol  10 mg Oral Daily   enoxaparin (LOVENOX) injection  100 mg Subcutaneous BID   furosemide  40 mg Intravenous Q12H   nicotine  21 mg Transdermal Daily   sertraline  50 mg Oral Daily   spironolactone  25 mg Oral q AM   warfarin  12.5 mg Oral ONCE-1600   Warfarin - Pharmacist Dosing Inpatient   Does not apply q1600   zolpidem  5 mg Oral QHS   Continuous Infusions: PRN Meds:.acetaminophen **OR**  acetaminophen, hydrOXYzine, mouth rinse, mouth rinse, prochlorperazine   Anti-infectives (From admission, onward)    None       Subjective: Theresa Crawford today has no fevers, no emesis,  No chest pain,    -Dyspnea on exertion persist -Not voiding as much  Objective: Vitals:   04/09/23 0439 04/09/23 0500 04/09/23 0610 04/09/23 1346  BP: 133/83  129/62 (!) 128/41  Pulse: 76  61 88  Resp: 18     Temp: 98.9 F (37.2 C)   98.1 F (36.7 C)  TempSrc:      SpO2: 95%   90%  Weight:  108.7 kg    Height:        Intake/Output  Summary (Last 24 hours) at 04/09/2023 1645 Last data filed at 04/09/2023 1230 Gross per 24 hour  Intake 1612 ml  Output --  Net 1612 ml   Filed Weights   04/07/23 0353 04/08/23 0505 04/09/23 0500  Weight: 103.8 kg 109.1 kg 108.7 kg    Physical Exam  Gen:- Awake Alert, dyspnea on exertion persist, no significant conversational dyspnea HEENT:- [place].AT, No sclera icterus Neck-Supple Neck, +ve JVD,.  Lungs-faint bibasilar Rales, fair symmetrical air movement CV- S1, S2 normal, regular  Abd-  +ve B.Sounds, Abd Soft, No tenderness,    Extremity/Skin:- trace  edema, pedal pulses present  Psych-affect is anxious, oriented x3 Neuro-no new focal deficits, no tremors  Data Reviewed: I have personally reviewed following labs and imaging studies  CBC: Recent Labs  Lab 04/06/23 2350 04/08/23 0511  WBC 10.2 7.9  HGB 12.0 12.6  HCT 37.9 39.8  MCV 83.5 83.4  PLT 316 338   Basic Metabolic Panel: Recent Labs  Lab 04/06/23 2350 04/07/23 0241 04/08/23 0511  NA 137  --  138  K 3.9  --  3.7  CL 109  --  104  CO2 22  --  25  GLUCOSE 97  --  122*  BUN 16  --  22*  CREATININE 0.78  --  0.86  CALCIUM 8.7*  --  8.8*  MG  --  1.9  --   PHOS  --  3.5 4.0   GFR: Estimated Creatinine Clearance: 101.6 mL/min (by C-G formula based on SCr of 0.86 mg/dL). Liver Function Tests: Recent Labs  Lab 04/06/23 2350 04/08/23 0511  AST 19 23  ALT 14 15  ALKPHOS 50 56  BILITOT 0.4 0.7  PROT 7.8 7.8  ALBUMIN 3.7 3.6   Recent Results (from the past 240 hour(s))  MRSA Next Gen by PCR, Nasal     Status: None   Collection Time: 04/07/23  3:50 AM   Specimen: Nasal Mucosa; Nasal Swab  Result Value Ref Range Status   MRSA by PCR Next Gen NOT DETECTED NOT DETECTED Final    Comment: (NOTE) The GeneXpert MRSA Assay (FDA approved for NASAL specimens only), is one component of a comprehensive MRSA colonization surveillance program. It is not intended to diagnose MRSA infection nor to guide or monitor  treatment for MRSA infections. Test performance is not FDA approved in patients less than 54 years old. Performed at [facility], [address] [place], [place] [address]     Radiology Studies: No results found.  Scheduled Meds:  aspirin EC  81 mg Oral Daily   busPIRone  7.5 mg Oral TID   carvedilol  3.125 mg Oral BID WC   dapagliflozin propanediol  10 mg Oral Daily   enoxaparin (LOVENOX) injection  100 mg Subcutaneous BID  furosemide  40 mg Intravenous Q12H   nicotine  21 mg Transdermal Daily   sertraline  50 mg Oral Daily   spironolactone  25 mg Oral q AM   warfarin  12.5 mg Oral ONCE-1600   Warfarin - Pharmacist Dosing Inpatient   Does not apply q1600   zolpidem  5 mg Oral QHS   Continuous Infusions:   LOS: 2 days   Shon Hale M.D on 04/09/2023 at 4:45 PM  Go to [email or web address] - for contact info  [facility] - Office  [phone]  If 7PM-7AM, please contact night-coverage [email or web address] 04/09/2023, 4:45 PM

## 2023-04-09 NOTE — Progress Notes (Signed)
   04/09/23 1200  ReDS Vest / Clip  Station Marker B  Ruler Value 38  ReDS Actual Value  (low quality)

## 2023-04-09 NOTE — Plan of Care (Signed)
  Problem: Education: Goal: Knowledge of General Education information will improve Description Including pain rating scale, medication(s)/side effects and non-pharmacologic comfort measures Outcome: Progressing   Problem: Health Behavior/Discharge Planning: Goal: Ability to manage health-related needs will improve Outcome: Progressing   

## 2023-04-09 NOTE — Progress Notes (Addendum)
ANTICOAGULATION CONSULT NOTE -   Pharmacy Consult for Lovenox/warfarin Indication: mechanical mitral valve  Allergies  Allergen Reactions   Bee Venom Anaphylaxis   Lisinopril Anaphylaxis, Shortness Of Breath, Swelling and Other (See Comments)    Throat swells   Penicillins Shortness Of Breath, Swelling and Other (See Comments)    Has patient had a PCN reaction causing immediate rash, facial/tongue/throat swelling, SOB or lightheadedness with hypotension: Yes Has patient had a PCN reaction causing severe rash involving mucus membranes or skin necrosis: Yes Has patient had a PCN reaction that required hospitalization No Has patient had a PCN reaction occurring within the last 10 years: No If all of the above answers are "NO", then may proceed with Cephalosporin use.    Perflutren Lipid Microsphere Shortness Of Breath and Other (See Comments)    Chest Pain/Tightness, also   Shellfish Allergy Anaphylaxis   Contrast Media [Iodinated Contrast Media] Hives, Itching and Other (See Comments)    Itching and hives to throat, neck, face and arms.   No difficulty breathing.   This was given at [facility] approximately 2015. Contrast Dye    Patient Measurements: Height: 5\' 4"  (162.6 cm) Weight: 108.7 kg (239 lb 10.2 oz) IBW/kg (Calculated) : 54.7  Vital Signs: Temp: 98.9 F (37.2 C) (07/27 0439) Temp Source: Oral (07/26 2212) BP: 129/62 (07/27 0610) Pulse Rate: 61 (07/27 0610)  Labs: Recent Labs    04/06/23 2350 04/07/23 0241 04/07/23 0449 04/08/23 0511 04/09/23 0429  HGB 12.0  --   --  12.6  --   HCT 37.9  --   --  39.8  --   PLT 316  --   --  338  --   LABPROT 14.4  --  14.5 14.5 14.9  INR 1.1  --  1.1 1.1 1.1  CREATININE 0.78  --   --  0.86  --   TROPONINIHS 11 10  --   --   --     Estimated Creatinine Clearance: 101.6 mL/min (by C-G formula based on SCr of 0.86 mg/dL).   Medical History: Past Medical History:  Diagnosis Date   Anxiety    Arthritis     "lower back" (04/04/2018)   Bipolar disorder (HCC)    Chronic bronchitis (HCC)    Chronic diastolic CHF (congestive heart failure) (HCC)    Chronic lower back pain    DDD (degenerative disc disease), lumbar    Depression    Dyspnea    GERD (gastroesophageal reflux disease)    Headache    "weekly" (04/04/2018), miragrains in past (04/03/2019)   Heart murmur    Hypertension    Mitral valve disease    Normocytic anemia    Obesity    Palpitations    Pericarditis    Pneumonia    Premature atrial contractions    Pulmonary hypertension (HCC)    PVC's (premature ventricular contractions)    a. h/o palpitations with event monitor in 03/2017 showing NSR with PACs/PVCs.   Recurrent mitral valve stenosis and regurgitation s/p mitral valve repair    S/P mitral valve repair 03/27/2013   Dr. Darcus Austin - [place], [place] - complex valvuloplasty including resuspension of entire posterior leaflet using Gore-tex neochords and 30 mm Edwards Physio ring annuloplasty   S/P redo mitral valve replacement with mechanical valve 04/05/2019   29 mm Sorin Carbomedics Optiform bileaflet mechanical valve   S/P tricuspid valve repair 04/05/2019   28 mm Edwards mc3 ring annuloplasty   Tobacco abuse  Tricuspid regurgitation     Medications:  No current facility-administered medications on file prior to encounter.   Current Outpatient Medications on File Prior to Encounter  Medication Sig Dispense Refill   acetaminophen (TYLENOL) 325 MG tablet Take 2 tablets (650 mg total) by mouth every 6 (six) hours as needed for mild pain.     albuterol (VENTOLIN HFA) 108 (90 Base) MCG/ACT inhaler Inhale 2 puffs into the lungs every 6 (six) hours as needed for wheezing or shortness of breath. 18 g 2   busPIRone (BUSPAR) 7.5 MG tablet Take 1 tablet (7.5 mg total) by mouth 3 (three) times daily. 90 tablet 3   carvedilol (COREG) 12.5 MG tablet Take 1 tablet (12.5 mg total) by mouth 2 (two) times daily with a meal. 60 tablet  2   Cholecalciferol (VITAMIN D-3 PO) Take 1 capsule by mouth daily.     ferrous sulfate 325 (65 FE) MG tablet Take 1 tablet (325 mg total) by mouth daily with breakfast. 90 tablet 3   folic acid (FOLVITE) 400 MCG tablet Take 400 mcg by mouth daily.     furosemide (LASIX) 20 MG tablet Take 3 tablets (60 mg total) by mouth daily. 90 tablet 3   Omega-3 Fatty Acids (OMEGA-3 FISH OIL PO) Take 1 capsule by mouth daily at 6 (six) AM.     potassium chloride SA (KLOR-CON M) 20 MEQ tablet Take 40 mEq by mouth daily.     sertraline (ZOLOFT) 50 MG tablet Take 1 tablet (50 mg total) by mouth daily. 30 tablet 2   spironolactone (ALDACTONE) 25 MG tablet Take 1 tablet (25 mg total) by mouth in the morning. 30 tablet 3   traZODone (DESYREL) 50 MG tablet Take 1 tablet (50 mg total) by mouth at bedtime. For sleep and mood 30 tablet 3   vitamin B-12 (CYANOCOBALAMIN) 1000 MCG tablet Take 1,000 mcg by mouth daily.     warfarin (COUMADIN) 5 MG tablet Take 1.5 tablets (7.5 mg total) by mouth one time only at 4 PM. 45 tablet 2   hydrOXYzine (VISTARIL) 25 MG capsule Take 1 capsule (25 mg total) by mouth every 8 (eight) hours as needed for anxiety or itching. (Patient not taking: Reported on 04/07/2023) 30 capsule 0   nicotine (NICODERM CQ - DOSED IN MG/24 HOURS) 21 mg/24hr patch Place 1 patch (21 mg total) onto the skin daily. (Patient not taking: Reported on 04/07/2023) 28 patch 0     Assessment: 42 y.o. female with PMH VHD s/p MVR in 2020. HFpEF, CAD, pulm HTN, persistent Afib/flutter s/p DCCV in 2023 presenting with SOB in the setting of cocaine use and CHF and Afib. INR is subtherapeutic at 1.1. Last dose of warfarin was 7/24 per med rec.  INR 1.1 > 1.1 even after 10 mg x 2 days.   Meds: Warfarin (home regimen is 7.5 mg daily) Lovenox 100 mg IV q12H while bridging  Goal of Therapy:  INR 2.5-3.5 Monitor platelets by anticoagulation protocol: Yes   Plan:  Give warfarin 12.5 mg PO x 1 Continue enoxaparin 100 mg  IV q12H while INR subtherapeutic CBC and INR daily while on warfarin and enoxaparin  Judeth Cornfield, PharmD Clinical Pharmacist 04/09/2023 7:54 AM

## 2023-04-09 NOTE — Progress Notes (Signed)
Patient continues to do well off BIPAP and has not needed any assistance of the device;RT has unit on standby.

## 2023-04-10 LAB — RENAL FUNCTION PANEL
Albumin: 4.1 g/dL (ref 3.5–5.0)
Anion gap: 6 (ref 5–15)
BUN: 23 mg/dL — ABNORMAL HIGH (ref 6–20)
CO2: 26 mmol/L (ref 22–32)
Calcium: 8.8 mg/dL — ABNORMAL LOW (ref 8.9–10.3)
Chloride: 100 mmol/L (ref 98–111)
Creatinine, Ser: 0.98 mg/dL (ref 0.44–1.00)
GFR, Estimated: >60 mL/min (ref >60)
Glucose, Bld: 122 mg/dL — ABNORMAL HIGH (ref 70–99)
Phosphorus: 4.3 mg/dL (ref 2.5–4.6)
Potassium: 4.7 mmol/L (ref 3.5–5.1)
Sodium: 132 mmol/L — ABNORMAL LOW (ref 135–145)

## 2023-04-10 LAB — PROTIME-INR
INR: 1.4 — ABNORMAL HIGH (ref 0.8–1.2)
Prothrombin Time: 17 s — ABNORMAL HIGH (ref 11.4–15.2)

## 2023-04-10 MED ORDER — BUSPIRONE HCL 15 MG PO TABS: 15.0000 mg | ORAL_TABLET | Freq: Three times a day (TID) | ORAL | 3 refills | Status: AC

## 2023-04-10 MED ORDER — WARFARIN SODIUM 7.5 MG PO TABS
12.5000 mg | ORAL_TABLET | Freq: Once | ORAL | Status: AC
  Administered 2023-04-10: 12.5 mg via ORAL
  Filled 2023-04-10: qty 1

## 2023-04-10 MED ORDER — CARVEDILOL 3.125 MG PO TABS: 3.1250 mg | ORAL_TABLET | Freq: Two times a day (BID) | ORAL | 5 refills | Status: DC

## 2023-04-10 MED ORDER — FERROUS SULFATE 325 (65 FE) MG PO TABS: 325.0000 mg | ORAL_TABLET | Freq: Every day | ORAL | 3 refills | Status: AC

## 2023-04-10 MED ORDER — SPIRONOLACTONE 25 MG PO TABS: 25.0000 mg | ORAL_TABLET | Freq: Every morning | ORAL | 3 refills | Status: DC

## 2023-04-10 MED ORDER — HYDROXYZINE HCL 25 MG PO TABS: 25.0000 mg | ORAL_TABLET | Freq: Four times a day (QID) | ORAL | 3 refills | Status: DC | PRN

## 2023-04-10 MED ORDER — FUROSEMIDE 80 MG PO TABS: 80.0000 mg | ORAL_TABLET | Freq: Every day | ORAL | 11 refills | Status: AC

## 2023-04-10 MED ORDER — ENOXAPARIN SODIUM 100 MG/ML IJ SOSY: 100.0000 mg | PREFILLED_SYRINGE | Freq: Two times a day (BID) | INTRAMUSCULAR | 0 refills | Status: DC

## 2023-04-10 MED ORDER — ALBUTEROL SULFATE HFA 108 (90 BASE) MCG/ACT IN AERS: 2.0000 | INHALATION_SPRAY | Freq: Four times a day (QID) | RESPIRATORY_TRACT | 2 refills | Status: AC | PRN

## 2023-04-10 MED ORDER — TRAZODONE HCL 100 MG PO TABS: 100.0000 mg | ORAL_TABLET | Freq: Every day | ORAL | 5 refills | Status: AC

## 2023-04-10 MED ORDER — WARFARIN SODIUM 7.5 MG PO TABS: 12.5000 mg | ORAL_TABLET | Freq: Once | ORAL | Status: DC

## 2023-04-10 MED ORDER — WARFARIN - PHARMACIST DOSING INPATIENT: Freq: Every day | Status: DC

## 2023-04-10 MED ORDER — VITAMIN B-12 1000 MCG PO TABS: 1000.0000 ug | ORAL_TABLET | Freq: Every day | ORAL | 3 refills | Status: AC

## 2023-04-10 MED ORDER — FOLIC ACID 400 MCG PO TABS: 400.0000 ug | ORAL_TABLET | Freq: Every day | ORAL | 5 refills | Status: AC

## 2023-04-10 MED ORDER — SERTRALINE HCL 50 MG PO TABS: 50.0000 mg | ORAL_TABLET | Freq: Every day | ORAL | 2 refills | Status: AC

## 2023-04-10 MED ORDER — WARFARIN SODIUM 5 MG PO TABS: 10.0000 mg | ORAL_TABLET | Freq: Once | ORAL | 2 refills | Status: DC

## 2023-04-10 MED ORDER — DAPAGLIFLOZIN PROPANEDIOL 10 MG PO TABS: 10.0000 mg | ORAL_TABLET | Freq: Every day | ORAL | 5 refills | Status: DC

## 2023-04-10 MED ORDER — ASPIRIN 81 MG PO TBEC: 81.0000 mg | DELAYED_RELEASE_TABLET | Freq: Every day | ORAL | 2 refills | Status: AC

## 2023-04-10 NOTE — Progress Notes (Signed)
Nsg Discharge Note  Admit Date:  04/07/2023 Discharge date: 04/10/2023   KILEE LICHTENSTEIN to be D/C'd Home per MD order.  AVS completed.   Patient/caregiver able to verbalize understanding.  Discharge Medication: Allergies as of 04/10/2023       Reactions   Bee Venom Anaphylaxis   Lisinopril Anaphylaxis, Shortness Of Breath, Swelling, Other (See Comments)   Throat swells   Penicillins Shortness Of Breath, Swelling, Other (See Comments)   Has patient had a PCN reaction causing immediate rash, facial/tongue/throat swelling, SOB or lightheadedness with hypotension: Yes Has patient had a PCN reaction causing severe rash involving mucus membranes or skin necrosis: Yes Has patient had a PCN reaction that required hospitalization No Has patient had a PCN reaction occurring within the last 10 years: No If all of the above answers are "NO", then may proceed with Cephalosporin use.   Perflutren Lipid Microsphere Shortness Of Breath, Other (See Comments)   Chest Pain/Tightness, also   Shellfish Allergy Anaphylaxis   Contrast Media [iodinated Contrast Media] Hives, Itching, Other (See Comments)   Itching and hives to throat, neck, face and arms.   No difficulty breathing.   This was given at [facility] approximately 2015. Contrast Dye        Medication List     STOP taking these medications    hydrOXYzine 25 MG capsule Commonly known as: VISTARIL   nicotine 21 mg/24hr patch Commonly known as: NICODERM CQ - dosed in mg/24 hours   potassium chloride SA 20 MEQ tablet Commonly known as: KLOR-CON M       TAKE these medications    acetaminophen 325 MG tablet Commonly known as: TYLENOL Take 2 tablets (650 mg total) by mouth every 6 (six) hours as needed for mild pain.   albuterol 108 (90 Base) MCG/ACT inhaler Commonly known as: VENTOLIN HFA Inhale 2 puffs into the lungs every 6 (six) hours as needed for wheezing or shortness of breath.   aspirin EC 81 MG tablet Take  1 tablet (81 mg total) by mouth daily with breakfast.   busPIRone 15 MG tablet Commonly known as: BUSPAR Take 1 tablet (15 mg total) by mouth 3 (three) times daily. What changed:  medication strength how much to take   carvedilol 3.125 MG tablet Commonly known as: COREG Take 1 tablet (3.125 mg total) by mouth 2 (two) times daily with a meal. What changed:  medication strength how much to take   cyanocobalamin 1000 MCG tablet Commonly known as: VITAMIN B12 Take 1 tablet (1,000 mcg total) by mouth daily.   dapagliflozin propanediol 10 MG Tabs tablet Commonly known as: FARXIGA Take 1 tablet (10 mg total) by mouth daily. Start taking on: April 11, 2023   enoxaparin 100 MG/ML injection Commonly known as: LOVENOX Inject 1 mL (100 mg total) into the skin every 12 (twelve) hours for 4 days.   ferrous sulfate 325 (65 FE) MG tablet Take 1 tablet (325 mg total) by mouth daily with breakfast.   folic acid 400 MCG tablet Commonly known as: FOLVITE Take 1 tablet (400 mcg total) by mouth daily.   furosemide 80 MG tablet Commonly known as: Lasix Take 1 tablet (80 mg total) by mouth daily. What changed:  medication strength how much to take   hydrOXYzine 25 MG tablet Commonly known as: ATARAX Take 1 tablet (25 mg total) by mouth every 6 (six) hours as needed for anxiety.   OMEGA-3 FISH OIL PO Take 1 capsule by mouth daily at  6 (six) AM.   sertraline 50 MG tablet Commonly known as: Zoloft Take 1 tablet (50 mg total) by mouth daily.   spironolactone 25 MG tablet Commonly known as: ALDACTONE Take 1 tablet (25 mg total) by mouth in the morning.   traZODone 100 MG tablet Commonly known as: DESYREL Take 1 tablet (100 mg total) by mouth at bedtime. What changed:  medication strength how much to take additional instructions   VITAMIN D-3 PO Take 1 capsule by mouth daily.   warfarin 5 MG tablet Commonly known as: COUMADIN Take as directed. If you are unsure how to take  this medication, talk to your nurse or doctor. Original instructions: Take 2 tablets (10 mg total) by mouth one time only at 4 PM. What changed: how much to take        Discharge Assessment: Vitals:   04/09/23 1925 04/10/23 0528  BP: 118/85 (!) 142/91  Pulse: 80 96  Resp: 20 19  Temp: 98.9 F (37.2 C) 98 F (36.7 C)  SpO2: 96% 97%   Skin clean, dry and intact without evidence of skin break down, no evidence of skin tears noted. IV catheter discontinued intact. Site without signs and symptoms of complications - no redness or edema noted at insertion site, patient denies c/o pain - only slight tenderness at site.  Dressing with slight pressure applied.  D/c Instructions-Education: Discharge instructions given to patient/family with verbalized understanding. D/c education completed with patient/family including follow up instructions, medication list, d/c activities limitations if indicated, with other d/c instructions as indicated by MD - patient able to verbalize understanding, all questions fully answered. Patient instructed to return to ED, call 911, or call MD for any changes in condition.  Patient escorted via WC, and D/C home via private auto.  Laurena Spies, RN 04/10/2023 11:45 AM

## 2023-04-10 NOTE — Discharge Summary (Signed)
Theresa Crawford, is a 42 y.o. female  DOB March 08, 1981  MRN [id number].  Admission date:  04/07/2023  Admitting Physician  Frankey Shown, DO  Discharge Date:  04/10/2023   Primary MD  Patient, No Pcp Per  Recommendations for primary care physician for things to follow:   1)Very low-salt diet advised---less than 2 gm of Sodium per day 2)Weigh yourself daily, call if you gain more than 3 pounds in 1 day or more than 5 pounds in 1 week as your diuretic medications may need to be adjusted 3)Avoid ibuprofen/Advil/Aleve/Motrin/Goody Powders/Naproxen/BC powders/Meloxicam/Diclofenac/Indomethacin and other Nonsteroidal anti-inflammatory medications as these will make you more likely to bleed and can cause stomach ulcers, can also cause Kidney problems. 4)Take Coumadin 10 mg every day until you recheck your INR in the next 2 to 3 days with your doctor---your  doctor would like to know the new dose of your Coumadin/warfarin after your INR test results are available 5) complete abstinence from cocaine and other illicit drugs advised--- outpatient drug rehab program recommended 6) complete abstinence from tobacco advised may use nicotine patch to help you quit smoking tobacco-- 7)You need to repeat PT/INR blood test in 2 to 3 days which primary care physician or the Coumadin clinic--- this is very important as the level of your PT/INR test result will determine how much Coumadin you have been taking going forward  Admission Diagnosis  Acute on chronic systolic CHF (congestive heart failure) (HCC) [I50.23]   Discharge Diagnosis  Acute on chronic systolic CHF (congestive heart failure) (HCC) [I50.23]    Principal Problem:   Acute on chronic systolic CHF (congestive heart failure) (HCC) Active Problems:   Acute respiratory failure with hypoxia (HCC)   Depression with anxiety   Tobacco abuse   Obesity (BMI 30-39.9)   History  of heart valve replacement with mechanical valve   Substance abuse (HCC)      Past Medical History:  Diagnosis Date   Anxiety    Arthritis    "lower back" (04/04/2018)   Bipolar disorder (HCC)    Chronic bronchitis (HCC)    Chronic diastolic CHF (congestive heart failure) (HCC)    Chronic lower back pain    DDD (degenerative disc disease), lumbar    Depression    Dyspnea    GERD (gastroesophageal reflux disease)    Headache    "weekly" (04/04/2018), miragrains in past (04/03/2019)   Heart murmur    Hypertension    Mitral valve disease    Normocytic anemia    Obesity    Palpitations    Pericarditis    Pneumonia    Premature atrial contractions    Pulmonary hypertension (HCC)    PVC's (premature ventricular contractions)    a. h/o palpitations with event monitor in 03/2017 showing NSR with PACs/PVCs.   Recurrent mitral valve stenosis and regurgitation s/p mitral valve repair    S/P mitral valve repair 03/27/2013   Dr. Darcus Austin - [place] - complex valvuloplasty including resuspension of entire posterior leaflet using Gore-tex neochords and 30  mm Edwards Physio ring annuloplasty   S/P redo mitral valve replacement with mechanical valve 04/05/2019   29 mm Sorin Carbomedics Optiform bileaflet mechanical valve   S/P tricuspid valve repair 04/05/2019   28 mm Edwards mc3 ring annuloplasty   Tobacco abuse    Tricuspid regurgitation     Past Surgical History:  Procedure Laterality Date   ABDOMINAL HERNIA REPAIR  ~ 2011   CARDIAC CATHETERIZATION  2014   CARDIOVERSION N/A 01/14/2022   Procedure: CARDIOVERSION;  Surgeon: Antoine Poche, MD;  Location: [facility];  Service: Endoscopy;  Laterality: N/A;   CESAREAN SECTION  2004; 2009   HERNIA REPAIR     MITRAL VALVE REPAIR  03/27/2013   Dr. Darcus Austin - [place], [place]. - complex valvuloplasty including resuspension of posterior leaflet with 30 mm CE Physio ring annuloplasty   MITRAL VALVE REPLACEMENT N/A 04/05/2019    Procedure: Mitral Valve (Mv) Replacement using Carbomedics Optiform Valve size 29mm;  Surgeon: Purcell Nails, MD;  Location: [facility] OR;  Service: Open Heart Surgery;  Laterality: N/A;   MULTIPLE EXTRACTIONS WITH ALVEOLOPLASTY N/A 04/03/2018   Procedure: Extraction of tooth #30 with alveoloplasty and gross debridement of remaining teeth;  Surgeon: Charlynne Pander, DDS;  Location: [facility] OR;  Service: Oral Surgery;  Laterality: N/A;   PARTIAL HYSTERECTOMY  2011   PID w/problem with fallopian tubes, had left fallopian and left ovary removed, pt still having periods   RIGHT HEART CATH N/A 03/27/2019   Procedure: RIGHT HEART CATH;  Surgeon: Laurey Morale, MD;  Location: [facility] INVASIVE CV LAB;  Service: Cardiovascular;  Laterality: N/A;   RIGHT/LEFT HEART CATH AND CORONARY ANGIOGRAPHY N/A 03/05/2019   Procedure: RIGHT/LEFT HEART CATH AND CORONARY ANGIOGRAPHY;  Surgeon: Kathleene Hazel, MD;  Location: [facility] INVASIVE CV LAB;  Service: Cardiovascular;  Laterality: N/A;   TEE WITHOUT CARDIOVERSION N/A 05/26/2016   Procedure: TRANSESOPHAGEAL ECHOCARDIOGRAM (TEE);  Surgeon: [name], MD;  Location: [facility];  Service: Cardiovascular;  Laterality: N/A;   TEE WITHOUT CARDIOVERSION N/A 01/25/2018   Procedure: TRANSESOPHAGEAL ECHOCARDIOGRAM (TEE) WITH PROPOFOL;  Surgeon: Jonelle Sidle, MD;  Location: [facility];  Service: Cardiovascular;  Laterality: N/A;   TEE WITHOUT CARDIOVERSION N/A 03/05/2019   Procedure: TRANSESOPHAGEAL ECHOCARDIOGRAM (TEE);  Surgeon: Pricilla Riffle, MD;  Location: [facility] ENDOSCOPY;  Service: Cardiovascular;  Laterality: N/A;   TEE WITHOUT CARDIOVERSION N/A 04/05/2019   Procedure: TRANSESOPHAGEAL ECHOCARDIOGRAM (TEE);  Surgeon: Purcell Nails, MD;  Location: [facility] OR;  Service: Open Heart Surgery;  Laterality: N/A;   TEE WITHOUT CARDIOVERSION N/A 01/14/2022   Procedure: TRANSESOPHAGEAL ECHOCARDIOGRAM (TEE);  Surgeon: Antoine Poche, MD;  Location: [facility];  Service: Endoscopy;   Laterality: N/A;   TRICUSPID VALVE REPLACEMENT N/A 04/05/2019   Procedure: Tricuspid Valve Repair using Edwards MC3 Tricuspid ring size 28mm;  Surgeon: Purcell Nails, MD;  Location: [facility] OR;  Service: Open Heart Surgery;  Laterality: N/A;     HPI  from the history and physical done on the day of admission:    HPI: Theresa Crawford is a 42 y.o. female with medical history significant of HFrEF, MVR, essential hypertension, depression with anxiety who presents to the emergency department due to 3-day onset of worsening shortness of breath which was associated with midsternal chest pressure that worsens with taking deep breath.  She complained of increased abdominal girth and some swelling in hands.  She endorsed use of cocaine yesterday. Patient states that she has been compliant with  her medications, but she recently increased salt in the diet.  She denies fever, cough.   ED Course: In the emergency department, she was hemodynamically stable.  Workup in the ED showed normal CBC and BMP.  Troponin x 1 was 11, BNP 285. Chest x-ray showed changes of CHF She was treated with IV Lasix 60 mg x 1, Dilaudid 0.5 mg x 1 was given, nitroglycerin 0.4 mg sublingual x 1 was given.  Hospitalist was asked to admit patient for further evaluation and management.   Review of Systems: Review of systems as noted in the HPI. All other systems reviewed and are negative.   Hospital Course:   Brief Narrative:  42 y.o. female with medical history significant of HFrEF, MVR, essential hypertension, depression with anxiety admitted on 04/07/2023 with acute on chronic systolic dysfunction CHF exacerbation in the setting of cocaine use     -Assessment and Plan: 1)HFrEF/acute on chronic systolic dysfunction CHF exacerbation in setting of cocaine abuse -Initially required BiPAP and supplemental oxygen -Cardiology consult appreciated -Echo on 04/07/2023 with EF 60 to 65%  (up from 50% back in December 2023) -Mitral valve  mechanical valve noted, prior tricuspid valve annuloplasty noted -Patient admits to noncompliance with medications prior to admission -Continue Aldactone 25 mg daily -C/n Farxiga-- --Wt 240.52 >>239.6>>238.1 Diuresed with iv Lasix  -As per Cardiologist ok to dc home with Lasix 80 mg daily   2)Valvular Heart Disease-- s/p  prior MV repair in 2014 with MV replacement in 03/2019 and also had TV repair with ring annuloplasty.  -Repeat echo as above #1 -Aspirin 81 mg daily -INR is up 1.4  -Lovenox bridge for now, repeat PT/INR in 2 to 3 days  -  3)Paroxysmal Atrial Flutter--- -s/p TEE/DCCV 01/2023  Coreg 3.125 mg po bid for rate control -Anticoagulation as above #2   4)Acute hypoxic respiratory failure--due to 1 above -Initially required BiPAP --Weaned off BiPAP  -Weaned off Oxygen  -Post ambulation oxygen sats > 93 % on RA   5)Tobacco abuse--smoking cessation advised, nicotine patch recommended   6)Depression/anxiety----made worse by polysubstance abuse -Continue scheduled Zoloft and BuSpar Use Hydroxyzine prn anxiety Use Trazodone prn Sleep   7)Polysubstance Abuse--- complete abstinence from cocaine strongly advised especially given cardiac issues -TOC gave referral to rehab programs  8)-Morbid Obesity- -Low calorie diet, portion control and increase physical activity discussed with patient -Body mass index is 40.87 kg/m.  Disposition: The patient is from: Home              Anticipated d/c is to: Home  Discharge Condition: stable  Follow UP   Follow-up Information     Ellsworth Lennox, PA-C. Schedule an appointment as soon as possible for a visit in 2 week(s).   Specialties: Cardiology, Cardiology Contact information: [address] [place] [address] [phone]                 Consults obtained - Cardiology  Diet and Activity recommendation:  As advised  Discharge Instructions    Discharge Instructions     Call MD for:  difficulty breathing,  headache or visual disturbances   Complete by: As directed    Call MD for:  persistant dizziness or light-headedness   Complete by: As directed    Call MD for:  persistant nausea and vomiting   Complete by: As directed    Call MD for:  temperature >100.4   Complete by: As directed    Diet - low sodium heart healthy  Complete by: As directed    Discharge instructions   Complete by: As directed    1)Very low-salt diet advised---less than 2 gm of Sodium per day 2)Weigh yourself daily, call if you gain more than 3 pounds in 1 day or more than 5 pounds in 1 week as your diuretic medications may need to be adjusted 3)Avoid ibuprofen/Advil/Aleve/Motrin/Goody Powders/Naproxen/BC powders/Meloxicam/Diclofenac/Indomethacin and other Nonsteroidal anti-inflammatory medications as these will make you more likely to bleed and can cause stomach ulcers, can also cause Kidney problems. 4)Take Coumadin 10 mg every day until you recheck your INR in the next 2 to 3 days with your doctor---your  doctor would like to know the new dose of your Coumadin/warfarin after your INR test results are available 5) complete abstinence from cocaine and other illicit drugs advised--- outpatient drug rehab program recommended 6) complete abstinence from tobacco advised may use nicotine patch to help you quit smoking tobacco-- 7)You need to repeat PT/INR blood test in 2 to 3 days which primary care physician or the Coumadin clinic--- this is very important as the level of your PT/INR test result will determine how much Coumadin you have been taking going forward   Increase activity slowly   Complete by: As directed        Discharge Medications     Allergies as of 04/10/2023       Reactions   Bee Venom Anaphylaxis   Lisinopril Anaphylaxis, Shortness Of Breath, Swelling, Other (See Comments)   Throat swells   Penicillins Shortness Of Breath, Swelling, Other (See Comments)   Has patient had a PCN reaction causing immediate  rash, facial/tongue/throat swelling, SOB or lightheadedness with hypotension: Yes Has patient had a PCN reaction causing severe rash involving mucus membranes or skin necrosis: Yes Has patient had a PCN reaction that required hospitalization No Has patient had a PCN reaction occurring within the last 10 years: No If all of the above answers are "NO", then may proceed with Cephalosporin use.   Perflutren Lipid Microsphere Shortness Of Breath, Other (See Comments)   Chest Pain/Tightness, also   Shellfish Allergy Anaphylaxis   Contrast Media [iodinated Contrast Media] Hives, Itching, Other (See Comments)   Itching and hives to throat, neck, face and arms.   No difficulty breathing.   This [facility] approximately 2015. Contrast Dye        Medication List     STOP taking these medications    hydrOXYzine 25 MG capsule Commonly known as: VISTARIL   nicotine 21 mg/24hr patch Commonly known as: NICODERM CQ - dosed in mg/24 hours   potassium chloride SA 20 MEQ tablet Commonly known as: KLOR-CON M       TAKE these medications    acetaminophen 325 MG tablet Commonly known as: TYLENOL Take 2 tablets (650 mg total) by mouth every 6 (six) hours as needed for mild pain.   albuterol 108 (90 Base) MCG/ACT inhaler Commonly known as: VENTOLIN HFA Inhale 2 puffs into the lungs every 6 (six) hours as needed for wheezing or shortness of breath.   aspirin EC 81 MG tablet Take 1 tablet (81 mg total) by mouth daily with breakfast.   busPIRone 15 MG tablet Commonly known as: BUSPAR Take 1 tablet (15 mg total) by mouth 3 (three) times daily. What changed:  medication strength how much to take   carvedilol 3.125 MG tablet Commonly known as: COREG Take 1 tablet (3.125 mg total) by mouth 2 (two) times daily with a  meal. What changed:  medication strength how much to take   cyanocobalamin 1000 MCG tablet Commonly known as: VITAMIN B12 Take 1 tablet (1,000 mcg  total) by mouth daily.   dapagliflozin propanediol 10 MG Tabs tablet Commonly known as: FARXIGA Take 1 tablet (10 mg total) by mouth daily. Start taking on: April 11, 2023   enoxaparin 100 MG/ML injection Commonly known as: LOVENOX Inject 1 mL (100 mg total) into the skin every 12 (twelve) hours for 4 days.   ferrous sulfate 325 (65 FE) MG tablet Take 1 tablet (325 mg total) by mouth daily with breakfast.   folic acid 400 MCG tablet Commonly known as: FOLVITE Take 1 tablet (400 mcg total) by mouth daily.   furosemide 80 MG tablet Commonly known as: Lasix Take 1 tablet (80 mg total) by mouth daily. What changed:  medication strength how much to take   hydrOXYzine 25 MG tablet Commonly known as: ATARAX Take 1 tablet (25 mg total) by mouth every 6 (six) hours as needed for anxiety.   OMEGA-3 FISH OIL PO Take 1 capsule by mouth daily at 6 (six) AM.   sertraline 50 MG tablet Commonly known as: Zoloft Take 1 tablet (50 mg total) by mouth daily.   spironolactone 25 MG tablet Commonly known as: ALDACTONE Take 1 tablet (25 mg total) by mouth in the morning.   traZODone 100 MG tablet Commonly known as: DESYREL Take 1 tablet (100 mg total) by mouth at bedtime. What changed:  medication strength how much to take additional instructions   VITAMIN D-3 PO Take 1 capsule by mouth daily.   warfarin 5 MG tablet Commonly known as: COUMADIN Take as directed. If you are unsure how to take this medication, talk to your nurse or doctor. Original instructions: Take 2 tablets (10 mg total) by mouth one time only at 4 PM. What changed: how much to take       Major procedures and Radiology Reports - PLEASE review detailed and final reports for all details, in brief -   ECHOCARDIOGRAM COMPLETE  Result Date: 04/07/2023    ECHOCARDIOGRAM REPORT   Patient Name:   Theresa Crawford Date of Exam: 04/07/2023 Medical Rec #:  [id number]       Height:       64.0 in Accession #:    [id number]       Weight:       228.8 lb Date of Birth:  [birth year]       BSA:          2.072 m Patient Age:    42 years        BP:           120/42 mmHg Patient Gender: F               HR:           83 bpm. [name] Procedure: 2D Echo, Cardiac Doppler and Color Doppler Indications:    CHF-Acute Systolic I50.21  History:        Patient has prior history of Echocardiogram examinations, most                 recent 08/19/2022. Sepsis; Risk Factors:Non-Smoker.  Sonographer:    Aron Baba Referring Phys: [id number] OLADAPO ADEFESO  Sonographer Comments: Patient is obese. IMPRESSIONS  1. Left ventricular ejection fraction, by estimation, is 60 to 65%. The left ventricle has normal function. Left ventricular endocardial border not optimally defined to  evaluate regional wall motion. Left ventricular diastolic parameters are indeterminate.  2. Right ventricular systolic function was not well visualized. The right ventricular size is not well visualized. Tricuspid regurgitation signal is inadequate for assessing PA pressure.  3. Sorin Carbomedics Optiform bileaflet mechanical valve size     29mm is in the MV position. Stable moderate gradient across the valve, mean gradient 8 mmHg at HR of 83 bpm.     . The mitral valve has been repaired/replaced. No evidence of mitral valve regurgitation. The mean mitral valve gradient is 8.0 mmHg.  4. 28 mm Edwards mc3 ring annuloplasty of the tricuspid valve. The tricuspid valve is has been repaired/replaced.  5. The aortic valve was not well visualized. Aortic valve regurgitation is not visualized. No aortic stenosis is present.  6. The inferior vena cava is normal in size with greater than 50% respiratory variability, suggesting right atrial pressure of 3 mmHg. FINDINGS  Left Ventricle: LVOT VTI and velocities underestimated based on angle of acquisition. Left ventricular ejection fraction, by estimation, is 60 to 65%. The left ventricle has normal function. Left ventricular endocardial  border not optimally defined to evaluate regional wall motion. The left ventricular internal cavity size was normal in size. There is no left ventricular hypertrophy. Left ventricular diastolic parameters are indeterminate. Right Ventricle: The right ventricular size is not well visualized. Right vetricular wall thickness was not well visualized. Right ventricular systolic function was not well visualized. Tricuspid regurgitation signal is inadequate for assessing PA pressure. Left Atrium: Left atrial size was not well visualized. Right Atrium: Right atrial size was not well visualized. Pericardium: There is no evidence of pericardial effusion. Mitral Valve: Sorin Carbomedics Optiform bileaflet mechanical valve size 29mm is in the MV position. Stable moderate gradient across the valve, mean gradient 8 mmHg at HR of 83 bpm. The mitral valve has been repaired/replaced. No evidence of mitral valve regurgitation. MV peak gradient, 17.3 mmHg. The mean mitral valve gradient is 8.0 mmHg. Tricuspid Valve: 28 mm Edwards mc3 ring annuloplasty of the tricuspid valve. The tricuspid valve is has been repaired/replaced. Tricuspid valve regurgitation is mild . No evidence of tricuspid stenosis. Aortic Valve: The aortic valve was not well visualized. Aortic valve regurgitation is not visualized. No aortic stenosis is present. Aortic valve mean gradient measures 3.4 mmHg. Aortic valve peak gradient measures 8.0 mmHg. Aortic valve area, by VTI measures 1.50 cm. Pulmonic Valve: The pulmonic valve was not well visualized. Pulmonic valve regurgitation is mild. No evidence of pulmonic stenosis. Aorta: The aortic root and ascending aorta are structurally normal, with no evidence of dilitation. Venous: The inferior vena cava is normal in size with greater than 50% respiratory variability, suggesting right atrial pressure of 3 mmHg. IAS/Shunts: The interatrial septum was not well visualized.  LEFT VENTRICLE PLAX 2D LVIDd:         5.00 cm    Diastology LVIDs:         3.00 cm   LV e' medial:    4.82 cm/s LV PW:         1.00 cm   LV E/e' medial:  36.5 LV IVS:        0.80 cm   LV e' lateral:   7.93 cm/s LVOT diam:     1.70 cm   LV E/e' lateral: 22.2 LV SV:         36 LV SV Index:   18 LVOT Area:     2.27 cm  RIGHT VENTRICLE RV S prime:  7.80 cm/s TAPSE (M-mode): 1.0 cm LEFT ATRIUM             Index        RIGHT ATRIUM           Index LA diam:        4.20 cm 2.03 cm/m   RA Area:     10.50 cm LA Vol (A2C):   37.6 ml 18.15 ml/m  RA Volume:   19.30 ml  9.32 ml/m LA Vol (A4C):   81.4 ml 39.29 ml/m LA Biplane Vol: 58.5 ml 28.24 ml/m  AORTIC VALVE                    PULMONIC VALVE AV Area (Vmax):    1.86 cm     PR End Diast Vel: 5.29 msec AV Area (Vmean):   2.27 cm AV Area (VTI):     1.50 cm AV Vmax:           141.60 cm/s AV Vmean:          84.282 cm/s AV VTI:            0.243 m AV Peak Grad:      8.0 mmHg AV Mean Grad:      3.4 mmHg LVOT Vmax:         116.00 cm/s LVOT Vmean:        84.300 cm/s LVOT VTI:          0.160 m LVOT/AV VTI ratio: 0.66  AORTA Ao Root diam: 2.90 cm Ao Asc diam:  3.00 cm MITRAL VALVE MV Area (PHT): 2.84 cm     SHUNTS MV Area VTI:   0.74 cm     Systemic VTI:  0.16 m MV Peak grad:  17.3 mmHg    Systemic Diam: 1.70 cm MV Mean grad:  8.0 mmHg MV Vmax:       2.08 m/s MV Vmean:      132.0 cm/s MV Decel Time: 267 msec MR Peak grad: 14.4 mmHg MR Vmax:      190.00 cm/s MV E velocity: 176.00 cm/s MV A velocity: 88.20 cm/s MV E/A ratio:  2.00 Dina Rich MD Electronically signed by Dina Rich MD Signature Date/Time: 04/07/2023/3:56:54 PM    Final    DG Chest Port 1 View  Result Date: 04/07/2023 CLINICAL DATA:  Chest pain and shortness of breath 3 days, initial encounter EXAM: PORTABLE CHEST 1 VIEW COMPARISON:  08/18/2022 FINDINGS: Cardiac shadow is enlarged. Postsurgical changes are noted. Vascular congestion is seen with evidence of interstitial edema slightly increased when compare is a prior exam. No bony abnormality is  noted. IMPRESSION: Changes of CHF. Electronically Signed   By: Alcide Clever M.D.   On: 04/07/2023 01:14    Micro Results  Recent Results (from the past 240 hour(s))  MRSA Next Gen by PCR, Nasal     Status: None   Collection Time: 04/07/23  3:50 AM   Specimen: Nasal Mucosa; Nasal Swab  Result Value Ref Range Status   MRSA by PCR Next Gen NOT DETECTED NOT DETECTED Final    Comment: (NOTE) The GeneXpert MRSA Assay (FDA approved for NASAL specimens only), is one component of a comprehensive MRSA colonization surveillance program. It is not intended to diagnose MRSA infection nor to guide or monitor treatment for MRSA infections. Test performance is not FDA approved in patients less than 73 years old. Performed at [facility], [address] [place], [place] [address]  Today   Subjective    Theresa Crawford today has no new concerns Boyfriend at bedside -No dyspnea at rest Dyspnea with exertion has improved No further hypoxia- Post ambulation oxy sats--93 % on RA          Patient has been seen and examined prior to discharge   Objective   Blood pressure (!) 142/91, pulse 96, temperature 98 F (36.7 C), resp. rate 19, height 5\' 4"  (1.626 m), weight 108 kg, SpO2 97%.   Intake/Output Summary (Last 24 hours) at 04/10/2023 1214 Last data filed at 04/10/2023 1020 Gross per 24 hour  Intake 540 ml  Output --  Net 540 ml   Exam Gen:- Awake Alert, no acute distress  HEENT:- [place].AT, No sclera icterus Neck-Supple Neck,No JVD,.  Lungs-  CTAB , much improved air movement bilaterally CV- S1, S2 normal, regular, metallic click Abd-  +ve B.Sounds, Abd Soft, No tenderness,    Extremity/Skin:-Resolved edema,   good pulses Psych-affect is appropriate, oriented x3 Neuro-no new focal deficits, no tremors    Data Review   CBC w Diff:  Lab Results  Component Value Date   WBC 7.9 04/08/2023   HGB 12.6 04/08/2023   HCT 39.8 04/08/2023   PLT 338 04/08/2023   LYMPHOPCT 31 08/19/2022    MONOPCT 6 08/19/2022   EOSPCT 2 08/19/2022   BASOPCT 1 08/19/2022    CMP:  Lab Results  Component Value Date   NA 132 (L) 04/10/2023   K 4.7 04/10/2023   CL 100 04/10/2023   CO2 26 04/10/2023   BUN 23 (H) 04/10/2023   CREATININE 0.98 04/10/2023   PROT 7.8 04/08/2023   ALBUMIN 4.1 04/10/2023   BILITOT 0.7 04/08/2023   ALKPHOS 56 04/08/2023   AST 23 04/08/2023   ALT 15 04/08/2023  .  Total Discharge time is about 33 minutes  Shon Hale M.D on 04/10/2023 at 12:14 PM  Go to [email or web address] -  for contact info  [facility] - Office  [phone]

## 2023-04-10 NOTE — Progress Notes (Signed)
ANTICOAGULATION CONSULT NOTE -   Pharmacy Consult for Lovenox/warfarin Indication: mechanical mitral valve  Allergies  Allergen Reactions   Bee Venom Anaphylaxis   Lisinopril Anaphylaxis, Shortness Of Breath, Swelling and Other (See Comments)    Throat swells   Penicillins Shortness Of Breath, Swelling and Other (See Comments)    Has patient had a PCN reaction causing immediate rash, facial/tongue/throat swelling, SOB or lightheadedness with hypotension: Yes Has patient had a PCN reaction causing severe rash involving mucus membranes or skin necrosis: Yes Has patient had a PCN reaction that required hospitalization No Has patient had a PCN reaction occurring within the last 10 years: No If all of the above answers are "NO", then may proceed with Cephalosporin use.    Perflutren Lipid Microsphere Shortness Of Breath and Other (See Comments)    Chest Pain/Tightness, also   Shellfish Allergy Anaphylaxis   Contrast Media [Iodinated Contrast Media] Hives, Itching and Other (See Comments)    Itching and hives to throat, neck, face and arms.   No difficulty breathing.   This was given at [facility] approximately 2015. Contrast Dye    Patient Measurements: Height: 5\' 4"  (162.6 cm) Weight: 108 kg (238 lb 1.6 oz) IBW/kg (Calculated) : 54.7  Vital Signs: Temp: 98 F (36.7 C) (07/28 0528) BP: 142/91 (07/28 0528) Pulse Rate: 96 (07/28 0528)  Labs: Recent Labs    04/08/23 0511 04/09/23 0429 04/10/23 0415  HGB 12.6  --   --   HCT 39.8  --   --   PLT 338  --   --   LABPROT 14.5 14.9 17.0*  INR 1.1 1.1 1.4*  CREATININE 0.86  --  0.98    Estimated Creatinine Clearance: 88.8 mL/min (by C-G formula based on SCr of 0.98 mg/dL).   Medical History: Past Medical History:  Diagnosis Date   Anxiety    Arthritis    "lower back" (04/04/2018)   Bipolar disorder (HCC)    Chronic bronchitis (HCC)    Chronic diastolic CHF (congestive heart failure) (HCC)    Chronic lower  back pain    DDD (degenerative disc disease), lumbar    Depression    Dyspnea    GERD (gastroesophageal reflux disease)    Headache    "weekly" (04/04/2018), miragrains in past (04/03/2019)   Heart murmur    Hypertension    Mitral valve disease    Normocytic anemia    Obesity    Palpitations    Pericarditis    Pneumonia    Premature atrial contractions    Pulmonary hypertension (HCC)    PVC's (premature ventricular contractions)    a. h/o palpitations with event monitor in 03/2017 showing NSR with PACs/PVCs.   Recurrent mitral valve stenosis and regurgitation s/p mitral valve repair    S/P mitral valve repair 03/27/2013   Dr. Darcus Austin - Natale Milch, [place] - complex valvuloplasty including resuspension of entire posterior leaflet using Gore-tex neochords and 30 mm Edwards Physio ring annuloplasty   S/P redo mitral valve replacement with mechanical valve 04/05/2019   29 mm Sorin Carbomedics Optiform bileaflet mechanical valve   S/P tricuspid valve repair 04/05/2019   28 mm Edwards mc3 ring annuloplasty   Tobacco abuse    Tricuspid regurgitation     Medications:  No current facility-administered medications on file prior to encounter.   Current Outpatient Medications on File Prior to Encounter  Medication Sig Dispense Refill   acetaminophen (TYLENOL) 325 MG tablet Take 2 tablets (650 mg total) by mouth  every 6 (six) hours as needed for mild pain.     albuterol (VENTOLIN HFA) 108 (90 Base) MCG/ACT inhaler Inhale 2 puffs into the lungs every 6 (six) hours as needed for wheezing or shortness of breath. 18 g 2   busPIRone (BUSPAR) 7.5 MG tablet Take 1 tablet (7.5 mg total) by mouth 3 (three) times daily. 90 tablet 3   carvedilol (COREG) 12.5 MG tablet Take 1 tablet (12.5 mg total) by mouth 2 (two) times daily with a meal. 60 tablet 2   Cholecalciferol (VITAMIN D-3 PO) Take 1 capsule by mouth daily.     ferrous sulfate 325 (65 FE) MG tablet Take 1 tablet (325 mg total) by mouth daily  with breakfast. 90 tablet 3   folic acid (FOLVITE) 400 MCG tablet Take 400 mcg by mouth daily.     furosemide (LASIX) 20 MG tablet Take 3 tablets (60 mg total) by mouth daily. 90 tablet 3   Omega-3 Fatty Acids (OMEGA-3 FISH OIL PO) Take 1 capsule by mouth daily at 6 (six) AM.     potassium chloride SA (KLOR-CON M) 20 MEQ tablet Take 40 mEq by mouth daily.     sertraline (ZOLOFT) 50 MG tablet Take 1 tablet (50 mg total) by mouth daily. 30 tablet 2   spironolactone (ALDACTONE) 25 MG tablet Take 1 tablet (25 mg total) by mouth in the morning. 30 tablet 3   traZODone (DESYREL) 50 MG tablet Take 1 tablet (50 mg total) by mouth at bedtime. For sleep and mood 30 tablet 3   vitamin B-12 (CYANOCOBALAMIN) 1000 MCG tablet Take 1,000 mcg by mouth daily.     warfarin (COUMADIN) 5 MG tablet Take 1.5 tablets (7.5 mg total) by mouth one time only at 4 PM. 45 tablet 2   hydrOXYzine (VISTARIL) 25 MG capsule Take 1 capsule (25 mg total) by mouth every 8 (eight) hours as needed for anxiety or itching. (Patient not taking: Reported on 04/07/2023) 30 capsule 0   nicotine (NICODERM CQ - DOSED IN MG/24 HOURS) 21 mg/24hr patch Place 1 patch (21 mg total) onto the skin daily. (Patient not taking: Reported on 04/07/2023) 28 patch 0     Assessment: 42 y.o. female with PMH VHD s/p MVR in 2020. HFpEF, CAD, pulm HTN, persistent Afib/flutter s/p DCCV in 2023 presenting with SOB in the setting of cocaine use and CHF and Afib.   INR 1.1 > 1.1> 1.4  Meds: Warfarin (home regimen is 7.5 mg daily) Lovenox 100 mg IV q12H while bridging  Goal of Therapy:  INR 2.5-3.5 Monitor platelets by anticoagulation protocol: Yes   Plan:  Give warfarin 12.5 mg PO x 1 Continue enoxaparin 100 mg IV q12H while INR subtherapeutic CBC and INR daily while on warfarin and enoxaparin  Judeth Cornfield, PharmD Clinical Pharmacist 04/10/2023 9:14 AM

## 2023-04-10 NOTE — Discharge Instructions (Signed)
1)Very low-salt diet advised---less than 2 gm of Sodium per day 2)Weigh yourself daily, call if you gain more than 3 pounds in 1 day or more than 5 pounds in 1 week as your diuretic medications may need to be adjusted 3)Avoid ibuprofen/Advil/Aleve/Motrin/Goody Powders/Naproxen/BC powders/Meloxicam/Diclofenac/Indomethacin and other Nonsteroidal anti-inflammatory medications as these will make you more likely to bleed and can cause stomach ulcers, can also cause Kidney problems. 4)Take Coumadin 10 mg every day until you recheck your INR in the next 2 to 3 days with your doctor---your  doctor would like to know the new dose of your Coumadin/warfarin after your INR test results are available 5) complete abstinence from cocaine and other illicit drugs advised--- outpatient drug rehab program recommended 6) complete abstinence from tobacco advised may use nicotine patch to help you quit smoking tobacco-- 7)You need to repeat PT/INR blood test in 2 to 3 days which primary care physician or the Coumadin clinic--- this is very important as the level of your PT/INR test result will determine how much Coumadin you have been taking going forward

## 2023-04-10 NOTE — Progress Notes (Signed)
   04/10/23 1020  ReDS Vest / Clip  Station Marker B  Ruler Value 38  ReDS Value Range 36 - 40  ReDS Actual Value  (low quality)

## 2023-05-09 ENCOUNTER — Ambulatory Visit: Payer: Self-pay | Admitting: *Deleted

## 2023-05-19 ENCOUNTER — Ambulatory Visit (INDEPENDENT_AMBULATORY_CARE_PROVIDER_SITE_OTHER): Payer: 59 | Admitting: Primary Care

## 2023-05-23 ENCOUNTER — Ambulatory Visit: Payer: 59 | Admitting: Internal Medicine

## 2023-05-23 NOTE — Progress Notes (Signed)
Erroneous encounter - please disregard.

## 2023-06-08 ENCOUNTER — Ambulatory Visit: Payer: 59 | Admitting: Internal Medicine

## 2023-10-10 ENCOUNTER — Inpatient Hospital Stay (HOSPITAL_COMMUNITY): Payer: MEDICAID

## 2023-10-10 ENCOUNTER — Other Ambulatory Visit: Payer: Self-pay

## 2023-10-10 ENCOUNTER — Emergency Department (HOSPITAL_COMMUNITY): Payer: MEDICAID

## 2023-10-10 ENCOUNTER — Encounter (HOSPITAL_COMMUNITY): Payer: Self-pay | Admitting: Emergency Medicine

## 2023-10-10 ENCOUNTER — Inpatient Hospital Stay (HOSPITAL_COMMUNITY)
Admission: EM | Admit: 2023-10-10 | Discharge: 2023-10-12 | DRG: 917 | Disposition: A | Discharge status: Home / Self Care | Payer: MEDICAID | Attending: Family Medicine | Admitting: Family Medicine

## 2023-10-10 DIAGNOSIS — Z716 Tobacco abuse counseling: Secondary | ICD-10-CM

## 2023-10-10 DIAGNOSIS — Z7901 Long term (current) use of anticoagulants: Secondary | ICD-10-CM

## 2023-10-10 DIAGNOSIS — X58XXXA Exposure to other specified factors, initial encounter: Secondary | ICD-10-CM | POA: Diagnosis present

## 2023-10-10 DIAGNOSIS — T405X1A Poisoning by cocaine, accidental (unintentional), initial encounter: Principal | ICD-10-CM | POA: Diagnosis present

## 2023-10-10 DIAGNOSIS — Z6841 Body Mass Index (BMI) 40.0 and over, adult: Secondary | ICD-10-CM

## 2023-10-10 DIAGNOSIS — F1910 Other psychoactive substance abuse, uncomplicated: Secondary | ICD-10-CM | POA: Diagnosis present

## 2023-10-10 DIAGNOSIS — Z9079 Acquired absence of other genital organ(s): Secondary | ICD-10-CM | POA: Diagnosis not present

## 2023-10-10 DIAGNOSIS — M1909 Primary osteoarthritis, other specified site: Secondary | ICD-10-CM | POA: Diagnosis present

## 2023-10-10 DIAGNOSIS — Z91041 Radiographic dye allergy status: Secondary | ICD-10-CM

## 2023-10-10 DIAGNOSIS — Z952 Presence of prosthetic heart valve: Secondary | ICD-10-CM

## 2023-10-10 DIAGNOSIS — Z91013 Allergy to seafood: Secondary | ICD-10-CM

## 2023-10-10 DIAGNOSIS — E66813 Obesity, class 3: Secondary | ICD-10-CM | POA: Diagnosis present

## 2023-10-10 DIAGNOSIS — Z8249 Family history of ischemic heart disease and other diseases of the circulatory system: Secondary | ICD-10-CM

## 2023-10-10 DIAGNOSIS — F17210 Nicotine dependence, cigarettes, uncomplicated: Secondary | ICD-10-CM | POA: Diagnosis present

## 2023-10-10 DIAGNOSIS — Z90721 Acquired absence of ovaries, unilateral: Secondary | ICD-10-CM

## 2023-10-10 DIAGNOSIS — Z880 Allergy status to penicillin: Secondary | ICD-10-CM

## 2023-10-10 DIAGNOSIS — Z5900 Homelessness unspecified: Secondary | ICD-10-CM

## 2023-10-10 DIAGNOSIS — Z91148 Patient's other noncompliance with medication regimen for other reason: Secondary | ICD-10-CM

## 2023-10-10 DIAGNOSIS — K219 Gastro-esophageal reflux disease without esophagitis: Secondary | ICD-10-CM | POA: Diagnosis present

## 2023-10-10 DIAGNOSIS — Z888 Allergy status to other drugs, medicaments and biological substances status: Secondary | ICD-10-CM

## 2023-10-10 DIAGNOSIS — J9601 Acute respiratory failure with hypoxia: Secondary | ICD-10-CM | POA: Diagnosis present

## 2023-10-10 DIAGNOSIS — R0602 Shortness of breath: Secondary | ICD-10-CM | POA: Diagnosis present

## 2023-10-10 DIAGNOSIS — F319 Bipolar disorder, unspecified: Secondary | ICD-10-CM | POA: Diagnosis present

## 2023-10-10 DIAGNOSIS — F418 Other specified anxiety disorders: Secondary | ICD-10-CM | POA: Diagnosis not present

## 2023-10-10 DIAGNOSIS — Z720 Tobacco use: Secondary | ICD-10-CM | POA: Diagnosis present

## 2023-10-10 DIAGNOSIS — I110 Hypertensive heart disease with heart failure: Secondary | ICD-10-CM | POA: Diagnosis present

## 2023-10-10 DIAGNOSIS — R7989 Other specified abnormal findings of blood chemistry: Secondary | ICD-10-CM | POA: Insufficient documentation

## 2023-10-10 DIAGNOSIS — Z713 Dietary counseling and surveillance: Secondary | ICD-10-CM | POA: Diagnosis not present

## 2023-10-10 DIAGNOSIS — I272 Pulmonary hypertension, unspecified: Secondary | ICD-10-CM | POA: Diagnosis present

## 2023-10-10 DIAGNOSIS — I5023 Acute on chronic systolic (congestive) heart failure: Secondary | ICD-10-CM | POA: Diagnosis present

## 2023-10-10 DIAGNOSIS — Z79899 Other long term (current) drug therapy: Secondary | ICD-10-CM

## 2023-10-10 DIAGNOSIS — Z7151 Drug abuse counseling and surveillance of drug abuser: Secondary | ICD-10-CM

## 2023-10-10 DIAGNOSIS — F419 Anxiety disorder, unspecified: Secondary | ICD-10-CM | POA: Diagnosis present

## 2023-10-10 DIAGNOSIS — I38 Endocarditis, valve unspecified: Secondary | ICD-10-CM | POA: Insufficient documentation

## 2023-10-10 DIAGNOSIS — Z7982 Long term (current) use of aspirin: Secondary | ICD-10-CM

## 2023-10-10 DIAGNOSIS — Z91030 Bee allergy status: Secondary | ICD-10-CM | POA: Diagnosis not present

## 2023-10-10 DIAGNOSIS — T45516A Underdosing of anticoagulants, initial encounter: Secondary | ICD-10-CM | POA: Diagnosis present

## 2023-10-10 DIAGNOSIS — I509 Heart failure, unspecified: Secondary | ICD-10-CM | POA: Insufficient documentation

## 2023-10-10 LAB — COMPREHENSIVE METABOLIC PANEL
ALT: 9 U/L (ref 0–44)
AST: 16 U/L (ref 15–41)
Albumin: 3.8 g/dL (ref 3.5–5.0)
Alkaline Phosphatase: 54 U/L (ref 38–126)
Anion gap: 6 (ref 5–15)
BUN: 13 mg/dL (ref 6–20)
CO2: 24 mmol/L (ref 22–32)
Calcium: 8.8 mg/dL — ABNORMAL LOW (ref 8.9–10.3)
Chloride: 105 mmol/L (ref 98–111)
Creatinine, Ser: 0.7 mg/dL (ref 0.44–1.00)
GFR, Estimated: >60 mL/min (ref >60)
Glucose, Bld: 113 mg/dL — ABNORMAL HIGH (ref 70–99)
Potassium: 3.9 mmol/L (ref 3.5–5.1)
Sodium: 135 mmol/L (ref 135–145)
Total Bilirubin: 0.8 mg/dL (ref 0.0–1.2)
Total Protein: 7.8 g/dL (ref 6.5–8.1)

## 2023-10-10 LAB — CBC
HCT: 39.8 % (ref 36.0–46.0)
Hemoglobin: 12.7 g/dL (ref 12.0–15.0)
MCH: 26.1 pg (ref 26.0–34.0)
MCHC: 31.9 g/dL (ref 30.0–36.0)
MCV: 81.7 fL (ref 80.0–100.0)
Platelets: 346 10*3/uL (ref 150–400)
RBC: 4.87 MIL/uL (ref 3.87–5.11)
RDW: 18.9 % — ABNORMAL HIGH (ref 11.5–15.5)
WBC: 15.9 10*3/uL — ABNORMAL HIGH (ref 4.0–10.5)
nRBC: 0 % (ref 0.0–0.2)

## 2023-10-10 LAB — TROPONIN I (HIGH SENSITIVITY)
Troponin I (High Sensitivity): 7 ng/L (ref <18)
Troponin I (High Sensitivity): 6 ng/L (ref <18)

## 2023-10-10 LAB — PROTIME-INR
INR: 1 (ref 0.8–1.2)
Prothrombin Time: 13.7 s (ref 11.4–15.2)

## 2023-10-10 LAB — MAGNESIUM: Magnesium: 2.4 mg/dL (ref 1.7–2.4)

## 2023-10-10 LAB — PHOSPHORUS: Phosphorus: 2.7 mg/dL (ref 2.5–4.6)

## 2023-10-10 LAB — BRAIN NATRIURETIC PEPTIDE: B Natriuretic Peptide: 190 pg/mL — ABNORMAL HIGH (ref 0.0–100.0)

## 2023-10-10 LAB — D-DIMER, QUANTITATIVE: D-Dimer, Quant: 1.39 ug{FEU}/mL — ABNORMAL HIGH (ref 0.00–0.50)

## 2023-10-10 MED ORDER — METHYLPREDNISOLONE SODIUM SUCC 40 MG IJ SOLR
40.0000 mg | Freq: Once | INTRAMUSCULAR | Status: AC
  Administered 2023-10-10: 40 mg via INTRAVENOUS
  Filled 2023-10-10: qty 1

## 2023-10-10 MED ORDER — DAPAGLIFLOZIN PROPANEDIOL 10 MG PO TABS
10.0000 mg | ORAL_TABLET | Freq: Every day | ORAL | Status: DC
  Administered 2023-10-10 – 2023-10-12 (×3): 10 mg via ORAL
  Filled 2023-10-10 (×3): qty 1

## 2023-10-10 MED ORDER — HYDROXYZINE HCL 25 MG PO TABS: 25.0000 mg | ORAL_TABLET | Freq: Four times a day (QID) | ORAL | Status: DC | PRN

## 2023-10-10 MED ORDER — LORAZEPAM 2 MG/ML IJ SOLN
2.0000 mg | Freq: Once | INTRAMUSCULAR | Status: AC
  Administered 2023-10-10: 2 mg via INTRAVENOUS
  Filled 2023-10-10: qty 1

## 2023-10-10 MED ORDER — POTASSIUM CHLORIDE CRYS ER 20 MEQ PO TBCR
40.0000 meq | EXTENDED_RELEASE_TABLET | Freq: Once | ORAL | Status: AC
  Administered 2023-10-10: 40 meq via ORAL
  Filled 2023-10-10: qty 2

## 2023-10-10 MED ORDER — ACETAMINOPHEN 650 MG RE SUPP
650.0000 mg | Freq: Four times a day (QID) | RECTAL | Status: DC | PRN
Start: 2023-10-10 — End: 2023-10-12

## 2023-10-10 MED ORDER — NICOTINE 21 MG/24HR TD PT24
21.0000 mg | MEDICATED_PATCH | Freq: Every day | TRANSDERMAL | Status: DC
  Administered 2023-10-10 – 2023-10-12 (×3): 21 mg via TRANSDERMAL
  Filled 2023-10-10 (×3): qty 1

## 2023-10-10 MED ORDER — LORAZEPAM 2 MG/ML IJ SOLN
1.0000 mg | Freq: Once | INTRAMUSCULAR | Status: AC
  Administered 2023-10-10: 1 mg via INTRAVENOUS
  Filled 2023-10-10: qty 1

## 2023-10-10 MED ORDER — BUSPIRONE HCL 5 MG PO TABS
15.0000 mg | ORAL_TABLET | Freq: Three times a day (TID) | ORAL | Status: DC
  Administered 2023-10-10: 15 mg via ORAL
  Filled 2023-10-10: qty 3

## 2023-10-10 MED ORDER — ENOXAPARIN SODIUM 120 MG/0.8ML IJ SOSY
115.0000 mg | PREFILLED_SYRINGE | Freq: Two times a day (BID) | INTRAMUSCULAR | Status: DC
Start: 2023-10-10 — End: 2023-10-12
  Administered 2023-10-10 – 2023-10-12 (×5): 115 mg via SUBCUTANEOUS
  Filled 2023-10-10 (×5): qty 0.8

## 2023-10-10 MED ORDER — TRAZODONE HCL 50 MG PO TABS: 100.0000 mg | ORAL_TABLET | Freq: Every day | ORAL | Status: DC

## 2023-10-10 MED ORDER — DIPHENHYDRAMINE HCL 50 MG/ML IJ SOLN
50.0000 mg | Freq: Once | INTRAMUSCULAR | Status: AC
  Administered 2023-10-10: 50 mg via INTRAVENOUS
  Filled 2023-10-10: qty 1

## 2023-10-10 MED ORDER — ASPIRIN 81 MG PO TBEC
81.0000 mg | DELAYED_RELEASE_TABLET | Freq: Every day | ORAL | Status: DC
  Administered 2023-10-10 – 2023-10-12 (×3): 81 mg via ORAL
  Filled 2023-10-10 (×3): qty 1

## 2023-10-10 MED ORDER — PROCHLORPERAZINE EDISYLATE 10 MG/2ML IJ SOLN: 10.0000 mg | Freq: Four times a day (QID) | INTRAMUSCULAR | Status: DC | PRN

## 2023-10-10 MED ORDER — WARFARIN - PHARMACIST DOSING INPATIENT: Freq: Every day | Status: DC

## 2023-10-10 MED ORDER — DIPHENHYDRAMINE HCL 25 MG PO CAPS
50.0000 mg | ORAL_CAPSULE | Freq: Once | ORAL | Status: AC
  Filled 2023-10-10: qty 2

## 2023-10-10 MED ORDER — ENOXAPARIN SODIUM 120 MG/0.8ML IJ SOSY: 120.0000 mg | PREFILLED_SYRINGE | Freq: Two times a day (BID) | INTRAMUSCULAR | Status: DC

## 2023-10-10 MED ORDER — WARFARIN SODIUM 5 MG PO TABS
10.0000 mg | ORAL_TABLET | Freq: Once | ORAL | Status: AC
  Administered 2023-10-10: 10 mg via ORAL
  Filled 2023-10-10: qty 2

## 2023-10-10 MED ORDER — ENOXAPARIN SODIUM 40 MG/0.4ML IJ SOSY: 40.0000 mg | PREFILLED_SYRINGE | INTRAMUSCULAR | Status: DC

## 2023-10-10 MED ORDER — ACETAMINOPHEN 325 MG PO TABS
650.0000 mg | ORAL_TABLET | Freq: Four times a day (QID) | ORAL | Status: DC | PRN
Start: 2023-10-10 — End: 2023-10-12

## 2023-10-10 MED ORDER — FUROSEMIDE 10 MG/ML IJ SOLN
80.0000 mg | Freq: Once | INTRAMUSCULAR | Status: AC
  Administered 2023-10-10: 80 mg via INTRAVENOUS
  Filled 2023-10-10: qty 8

## 2023-10-10 MED ORDER — SERTRALINE HCL 50 MG PO TABS
50.0000 mg | ORAL_TABLET | Freq: Every day | ORAL | Status: DC
  Administered 2023-10-10: 50 mg via ORAL
  Filled 2023-10-10: qty 1

## 2023-10-10 MED ORDER — IOHEXOL 350 MG/ML SOLN
75.0000 mL | Freq: Once | INTRAVENOUS | Status: AC | PRN
  Administered 2023-10-10: 75 mL via INTRAVENOUS

## 2023-10-10 MED ORDER — FUROSEMIDE 10 MG/ML IJ SOLN
40.0000 mg | Freq: Two times a day (BID) | INTRAMUSCULAR | Status: DC
  Administered 2023-10-10 – 2023-10-12 (×4): 40 mg via INTRAVENOUS
  Filled 2023-10-10 (×4): qty 4
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
## 2023-10-10 NOTE — Progress Notes (Signed)
Pt. Of unit to CT.

## 2023-10-10 NOTE — Progress Notes (Signed)
PHARMACY - ANTICOAGULATION CONSULT NOTE  Pharmacy Consult for warfarin/lovenox bridge Indication: mechanical mitral valve  Allergies  Allergen Reactions   Bee Venom Anaphylaxis   Lisinopril Anaphylaxis, Shortness Of Breath, Swelling and Other (See Comments)    Throat swells   Penicillins Shortness Of Breath, Swelling and Other (See Comments)    Has patient had a PCN reaction causing immediate rash, facial/tongue/throat swelling, SOB or lightheadedness with hypotension: Yes Has patient had a PCN reaction causing severe rash involving mucus membranes or skin necrosis: Yes Has patient had a PCN reaction that required hospitalization No Has patient had a PCN reaction occurring within the last 10 years: No If all of the above answers are "NO", then may proceed with Cephalosporin use.    Perflutren Lipid Microsphere Shortness Of Breath and Other (See Comments)    Chest Pain/Tightness, also   Shellfish Allergy Anaphylaxis   Contrast Media [Iodinated Contrast Media] Hives, Itching and Other (See Comments)    Itching and hives to throat, neck, face and arms.   No difficulty breathing.   This was given at [facility] approximately 2015. Contrast Dye    Patient Measurements: Height: 5\' 4"  (162.6 cm) Weight: 116.6 kg (257 lb 0.9 oz) IBW/kg (Calculated) : 54.7 Heparin Dosing Weight:   Vital Signs: Temp: 98.7 F (37.1 C) (01/27 0603) Temp Source: Oral (01/27 0603) BP: 128/62 (01/27 1100) Pulse Rate: 72 (01/27 1100)  Labs: Recent Labs    10/10/23 0242 10/10/23 0520  HGB 12.7  --   HCT 39.8  --   PLT 346  --   LABPROT 13.7  --   INR 1.0  --   CREATININE 0.70  --   TROPONINIHS 7 6    Estimated Creatinine Clearance: 113.8 mL/min (by C-G formula based on SCr of 0.7 mg/dL).   Medical History: Past Medical History:  Diagnosis Date   Anxiety    Arthritis    "lower back" (04/04/2018)   Bipolar disorder (HCC)    Chronic bronchitis (HCC)    Chronic diastolic CHF  (congestive heart failure) (HCC)    Chronic lower back pain    DDD (degenerative disc disease), lumbar    Depression    Dyspnea    GERD (gastroesophageal reflux disease)    Headache    "weekly" (04/04/2018), miragrains in past (04/03/2019)   Heart murmur    Hypertension    Mitral valve disease    Normocytic anemia    Obesity    Palpitations    Pericarditis    Pneumonia    Premature atrial contractions    Pulmonary hypertension (HCC)    PVC's (premature ventricular contractions)    a. h/o palpitations with event monitor in 03/2017 showing NSR with PACs/PVCs.   Recurrent mitral valve stenosis and regurgitation s/p mitral valve repair    S/P mitral valve repair 03/27/2013   Dr. Darcus Austin - Natale Milch, PA - complex valvuloplasty including resuspension of entire posterior leaflet using Gore-tex neochords and 30 mm Edwards Physio ring annuloplasty   S/P redo mitral valve replacement with mechanical valve 04/05/2019   29 mm Sorin Carbomedics Optiform bileaflet mechanical valve   S/P tricuspid valve repair 04/05/2019   28 mm Edwards mc3 ring annuloplasty   Tobacco abuse    Tricuspid regurgitation     Medications:  Medications Prior to Admission  Medication Sig Dispense Refill Last Dose/Taking   acetaminophen (TYLENOL) 325 MG tablet Take 2 tablets (650 mg total) by mouth every 6 (six) hours as needed for mild pain.  albuterol (VENTOLIN HFA) 108 (90 Base) MCG/ACT inhaler Inhale 2 puffs into the lungs every 6 (six) hours as needed for wheezing or shortness of breath. 18 g 2    aspirin EC 81 MG tablet Take 1 tablet (81 mg total) by mouth daily with breakfast. 30 tablet 2    busPIRone (BUSPAR) 15 MG tablet Take 1 tablet (15 mg total) by mouth 3 (three) times daily. 90 tablet 3    carvedilol (COREG) 3.125 MG tablet Take 1 tablet (3.125 mg total) by mouth 2 (two) times daily with a meal. 60 tablet 5    Cholecalciferol (VITAMIN D-3 PO) Take 1 capsule by mouth daily.      cyanocobalamin  (VITAMIN B12) 1000 MCG tablet Take 1 tablet (1,000 mcg total) by mouth daily. 30 tablet 3    dapagliflozin propanediol (FARXIGA) 10 MG TABS tablet Take 1 tablet (10 mg total) by mouth daily. 30 tablet 5    enoxaparin (LOVENOX) 100 MG/ML injection Inject 1 mL (100 mg total) into the skin every 12 (twelve) hours for 4 days. 8 mL 0    ferrous sulfate 325 (65 FE) MG tablet Take 1 tablet (325 mg total) by mouth daily with breakfast. 90 tablet 3    folic acid (FOLVITE) 400 MCG tablet Take 1 tablet (400 mcg total) by mouth daily. 30 tablet 5    furosemide (LASIX) 80 MG tablet Take 1 tablet (80 mg total) by mouth daily. 30 tablet 11    hydrOXYzine (ATARAX) 25 MG tablet Take 1 tablet (25 mg total) by mouth every 6 (six) hours as needed for anxiety. 90 tablet 3    Omega-3 Fatty Acids (OMEGA-3 FISH OIL PO) Take 1 capsule by mouth daily at 6 (six) AM.      sertraline (ZOLOFT) 50 MG tablet Take 1 tablet (50 mg total) by mouth daily. 30 tablet 2    spironolactone (ALDACTONE) 25 MG tablet Take 1 tablet (25 mg total) by mouth in the morning. 30 tablet 3    traZODone (DESYREL) 100 MG tablet Take 1 tablet (100 mg total) by mouth at bedtime. 30 tablet 5    warfarin (COUMADIN) 5 MG tablet Take 2 tablets (10 mg total) by mouth one time only at 4 PM. 60 tablet 2     Assessment: Pharmacy consulted to dose lovenox and warfarin for patient with mitral valve replacement in 2020.  INR is subtherapeutic on admission at 1.0 due to noncompliance.  Med rec pending but last pharmacy fill showed patient taking 10 mg daily.  Goal of Therapy:  INR 2.5-3.5 Monitor platelets by anticoagulation protocol: Yes   Plan:  Lovenox 115 mg subq every 12 hours ( 1 mg/kg) until INR >2 Warfarin 10 mg x 1 dose Monitor daily INR and s/s of bleeding  Judeth Cornfield, PharmD Clinical Pharmacist 10/10/2023 12:04 PM

## 2023-10-10 NOTE — Progress Notes (Signed)
Pt. Back on unit from CT.
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
## 2023-10-10 NOTE — ED Provider Notes (Signed)
[facility]-EMERGENCY DEPT [facility] Emergency Department Provider Note MRN:  [id number]  Arrival date & time: 10/10/23     Chief Complaint   Chest Pain and Shortness of Breath   History of Present Illness   Theresa Crawford is a 43 y.o. year-old female with a history of bipolar disorder, CHF, hypertension presenting to the ED with chief complaint of chest pain and shortness of breath.  Patient feeling short of breath with chest pain.  Feeling very anxious.  Disagreement with boyfriend this evening.  Is very fearful for her safety at this time.  Newly homeless.  Requests that she have no visitors or phone calls.  Tearful.  Review of Systems  A thorough review of systems was obtained and all systems are negative except as noted in the HPI and PMH.   Patient's Health History    Past Medical History:  Diagnosis Date   Anxiety    Arthritis    "lower back" (04/04/2018)   Bipolar disorder (HCC)    Chronic bronchitis (HCC)    Chronic diastolic CHF (congestive heart failure) (HCC)    Chronic lower back pain    DDD (degenerative disc disease), lumbar    Depression    Dyspnea    GERD (gastroesophageal reflux disease)    Headache    "weekly" (04/04/2018), miragrains in past (04/03/2019)   Heart murmur    Hypertension    Mitral valve disease    Normocytic anemia    Obesity    Palpitations    Pericarditis    Pneumonia    Premature atrial contractions    Pulmonary hypertension (HCC)    PVC's (premature ventricular contractions)    a. h/o palpitations with event monitor in 03/2017 showing NSR with PACs/PVCs.   Recurrent mitral valve stenosis and regurgitation s/p mitral valve repair    S/P mitral valve repair 03/27/2013   Dr. Darcus Austin - [place], [place] - complex valvuloplasty including resuspension of entire posterior leaflet using Gore-tex neochords and 30 mm Edwards Physio ring annuloplasty   S/P redo mitral valve replacement with mechanical valve 04/05/2019   29 mm Sorin  Carbomedics Optiform bileaflet mechanical valve   S/P tricuspid valve repair 04/05/2019   28 mm Edwards mc3 ring annuloplasty   Tobacco abuse    Tricuspid regurgitation     Past Surgical History:  Procedure Laterality Date   ABDOMINAL HERNIA REPAIR  ~ 2011   CARDIAC CATHETERIZATION  2014   CARDIOVERSION N/A 01/14/2022   Procedure: CARDIOVERSION;  Surgeon: Antoine Poche, MD;  Location: [facility] ORS;  Service: Endoscopy;  Laterality: N/A;   CESAREAN SECTION  2004; 2009   HERNIA REPAIR     MITRAL VALVE REPAIR  03/27/2013   Dr. Darcus Austin - [place], [place]. - complex valvuloplasty including resuspension of posterior leaflet with 30 mm CE Physio ring annuloplasty   MITRAL VALVE REPLACEMENT N/A 04/05/2019   Procedure: Mitral Valve (Mv) Replacement using Carbomedics Optiform Valve size 29mm;  Surgeon: Purcell Nails, MD;  Location: [facility] OR;  Service: Open Heart Surgery;  Laterality: N/A;   MULTIPLE EXTRACTIONS WITH ALVEOLOPLASTY N/A 04/03/2018   Procedure: Extraction of tooth #30 with alveoloplasty and gross debridement of remaining teeth;  Surgeon: Charlynne Pander, DDS;  Location: [facility] OR;  Service: Oral Surgery;  Laterality: N/A;   PARTIAL HYSTERECTOMY  2011   PID w/problem with fallopian tubes, had left fallopian and left ovary removed, pt still having periods   RIGHT HEART CATH N/A 03/27/2019   Procedure: RIGHT HEART CATH;  Surgeon: Laurey Morale, MD;  Location: [facility] INVASIVE CV LAB;  Service: Cardiovascular;  Laterality: N/A;   RIGHT/LEFT HEART CATH AND CORONARY ANGIOGRAPHY N/A 03/05/2019   Procedure: RIGHT/LEFT HEART CATH AND CORONARY ANGIOGRAPHY;  Surgeon: Kathleene Hazel, MD;  Location: [facility] INVASIVE CV LAB;  Service: Cardiovascular;  Laterality: N/A;   TEE WITHOUT CARDIOVERSION N/A 05/26/2016   Procedure: TRANSESOPHAGEAL ECHOCARDIOGRAM (TEE);  Surgeon: [name], MD;  Location: [facility] ENDO SUITE;  Service: Cardiovascular;  Laterality: N/A;   TEE WITHOUT CARDIOVERSION N/A  01/25/2018   Procedure: TRANSESOPHAGEAL ECHOCARDIOGRAM (TEE) WITH PROPOFOL;  Surgeon: Jonelle Sidle, MD;  Location[facility] END[facility];  Service: Cardiovascular;  Laterality: N/A;   TEE WITHOUT CARDIOVERSION N/A 03/05/2019   Procedure: TRANSESOPHAGEAL ECHOCARDIOGRAM (TEE);  Surgeon: Pricilla Riffle, MD;  Location: [facility] ENDOSCOPY;  Service: Cardiovascular;  Laterality: N/A;   TEE WITHOUT CARDIOVERSION N/A 04/05/2019   Procedure: TRANSESOPHAGEAL ECHOCARDIOGRAM (TEE);  Surgeon: Purcell Nails, MD;  Location: [facility] OR;  Service: Open Heart Surgery;  Laterality: N/A;   TEE WITHOUT CARDIOVERSION N/A 01/14/2022   Procedure: TRANSESOPHAGEAL ECHOCARDIOGRAM (TEE);  Surgeon: Antoine Poche, MD;  Location: [facility] ORS;  Service: Endoscopy;  Laterality: N/A;   TRICUSPID VALVE REPLACEMENT N/A 04/05/2019   Procedure: Tricuspid Valve Repair using Edwards MC3 Tricuspid ring size 28mm;  Surgeon: Purcell Nails, MD;  Location: [facility] OR;  Service: Open Heart Surgery;  Laterality: N/A;    Family History  Problem Relation Age of Onset   Heart failure Father        just received LVAD   Heart disease Paternal Grandfather        stent placement    Social History   Socioeconomic History   Marital status: Single    Spouse name: Not on file   Number of children: 2   Years of education: Not on file   Highest education level: Not on file  Occupational History   Not on file  Tobacco Use   Smoking status: Every Day    Current packs/day: 0.50    Average packs/day: 0.5 packs/day for 24.0 years (12.0 ttl pk-yrs)    Types: Cigarettes    Start date: 11/03/1999   Smokeless tobacco: Never  Vaping Use   Vaping status: Never Used  Substance and Sexual Activity   Alcohol use: Yes   Drug use: Yes    Types: Marijuana, Cocaine   Sexual activity: Not Currently  Other Topics Concern   Not on file  Social History Narrative   Not on file   Social Drivers of Health   Financial Resource Strain: Not on file  Food Insecurity: No  Food Insecurity (04/07/2023)   Hunger Vital Sign    Worried About Running Out of Food in the Last Year: Never true    Ran Out of Food in the Last Year: Never true  Transportation Needs: No Transportation Needs (04/07/2023)   PRAPARE - Administrator, Civil Service (Medical): No    Lack of Transportation (Non-Medical): No  Physical Activity: Not on file  Stress: Not on file  Social Connections: Not on file  Intimate Partner Violence: Not At Risk (04/07/2023)   Humiliation, Afraid, Rape, and Kick questionnaire    Fear of Current or Ex-Partner: No    Emotionally Abused: No    Physically Abused: No    Sexually Abused: No     Physical Exam   Vitals:   10/10/23 0603 10/10/23 0624  BP:  114/81  Pulse:  80  Resp:  (!) 23  Temp: 98.7 F (37.1 C)   SpO2:  100%    CONSTITUTIONAL: Chronically ill-appearing, tearful, severely anxious NEURO/PSYCH:  Alert and oriented x 3, no focal deficits EYES:  eyes equal and reactive ENT/NECK:  no LAD, no JVD CARDIO: Regular rate, well-perfused, normal S1 and S2 PULM:  CTAB no wheezing or rhonchi GI/GU:  non-distended, non-tender MSK/SPINE:  No gross deformities, no edema SKIN:  no rash, atraumatic   *Additional and/or pertinent findings included in MDM below  Diagnostic and Interventional Summary    EKG Interpretation Date/Time:  Monday October 10 2023 02:46:03 EST Ventricular Rate:  89 PR Interval:  151 QRS Duration:  81 QT Interval:  379 QTC Calculation: 462 R Axis:   48  Text Interpretation: Sinus or ectopic atrial rhythm RSR' in V1 or V2, probably normal variant Nonspecific T abnrm, anterolateral leads Confirmed by Kennis Carina [phone]) on 10/10/2023 4:02:57 AM       Labs Reviewed  CBC - Abnormal; Notable for the following components:      Result Value   WBC 15.9 (*)    RDW 18.9 (*)    All other components within normal limits  COMPREHENSIVE METABOLIC PANEL - Abnormal; Notable for the following components:   Glucose,  Bld 113 (*)    Calcium 8.8 (*)    All other components within normal limits  D-DIMER, QUANTITATIVE - Abnormal; Notable for the following components:   D-Dimer, Quant 1.39 (*)    All other components within normal limits  BRAIN NATRIURETIC PEPTIDE - Abnormal; Notable for the following components:   B Natriuretic Peptide 190.0 (*)    All other components within normal limits  PROTIME-INR  TROPONIN I (HIGH SENSITIVITY)  TROPONIN I (HIGH SENSITIVITY)    DG Chest Port 1 View  Final Result    CT Angio Chest Pulmonary Embolism (PE) W or WO Contrast    (Results Pending)    Medications  diphenhydrAMINE (BENADRYL) capsule 50 mg (has no administration in time range)    Or  diphenhydrAMINE (BENADRYL) injection 50 mg (has no administration in time range)  furosemide (LASIX) injection 40 mg (has no administration in time range)  LORazepam (ATIVAN) injection 2 mg (2 mg Intravenous Given 10/10/23 0256)  furosemide (LASIX) injection 80 mg (80 mg Intravenous Given 10/10/23 0414)  LORazepam (ATIVAN) injection 1 mg (1 mg Intravenous Given 10/10/23 0426)  methylPREDNISolone sodium succinate (SOLU-MEDROL) 40 mg/mL injection 40 mg (40 mg Intravenous Given 10/10/23 0516)     Procedures  /  Critical Care Procedures  ED Course and Medical Decision Making  Initial Impression and Ddx Patient seems to be an active tear or panic attack, very fearful for her safety at this time.  Have confirmed that her chart is locked and she will not be receiving any visitors or phone calls.  Apart from anxiety related symptoms other considerations include pneumothorax, ACS, sympathomimetic toxidrome, overall low concern for PE.  Providing symptomatic management and will reassess.  Past medical/surgical history that increases complexity of ED encounter: CHF  Interpretation of Diagnostics I personally reviewed the EKG and my interpretation is as follows: Sinus rhythm  Labs reveal mildly elevated BNP, evidence of CHF on chest  x-ray.  Elevated D-dimer.   Patient Reassessment and Ultimate Disposition/Management     Patient remains tachypneic, anxious, requiring 2 L nasal cannula to maintain saturations in the 90s.  Awaiting PE study, will need pretreatment given history of contrast allergy.  Accepted for hospitalist admission.  Patient management required discussion with the following services or consulting groups:  Hospitalist Service  Complexity of Problems Addressed Acute illness or injury that poses threat of life of bodily function  Additional Data Reviewed and Analyzed Further history obtained from: Prior labs/imaging results  Additional Factors Impacting ED Encounter Risk Consideration of hospitalization  Elmer Sow. Pilar Plate, MD [facility] Emergency Medicine [facility] [email or web address]  Final Clinical Impressions(s) / ED Diagnoses     ICD-10-CM   1. Shortness of breath  R06.02       ED Discharge Orders     None        Discharge Instructions Discussed with and Provided to Patient:   Discharge Instructions   None      Sabas Sous, MD 10/10/23 [phone]
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
## 2023-10-11 DIAGNOSIS — I5023 Acute on chronic systolic (congestive) heart failure: Secondary | ICD-10-CM | POA: Diagnosis not present

## 2023-10-11 LAB — CBC
HCT: 39.6 % (ref 36.0–46.0)
Hemoglobin: 12.4 g/dL (ref 12.0–15.0)
MCH: 25.4 pg — ABNORMAL LOW (ref 26.0–34.0)
MCHC: 31.3 g/dL (ref 30.0–36.0)
MCV: 81.1 fL (ref 80.0–100.0)
Platelets: 371 10*3/uL (ref 150–400)
RBC: 4.88 MIL/uL (ref 3.87–5.11)
RDW: 18.7 % — ABNORMAL HIGH (ref 11.5–15.5)
WBC: 10.8 10*3/uL — ABNORMAL HIGH (ref 4.0–10.5)
nRBC: 0 % (ref 0.0–0.2)

## 2023-10-11 LAB — COMPREHENSIVE METABOLIC PANEL
ALT: 9 U/L (ref 0–44)
AST: 14 U/L — ABNORMAL LOW (ref 15–41)
Albumin: 3.6 g/dL (ref 3.5–5.0)
Alkaline Phosphatase: 51 U/L (ref 38–126)
Anion gap: 9 (ref 5–15)
BUN: 16 mg/dL (ref 6–20)
CO2: 23 mmol/L (ref 22–32)
Calcium: 8.9 mg/dL (ref 8.9–10.3)
Chloride: 105 mmol/L (ref 98–111)
Creatinine, Ser: 0.87 mg/dL (ref 0.44–1.00)
GFR, Estimated: >60 mL/min (ref >60)
Glucose, Bld: 110 mg/dL — ABNORMAL HIGH (ref 70–99)
Potassium: 3.6 mmol/L (ref 3.5–5.1)
Sodium: 137 mmol/L (ref 135–145)
Total Bilirubin: 0.6 mg/dL (ref 0.0–1.2)
Total Protein: 7.9 g/dL (ref 6.5–8.1)

## 2023-10-11 LAB — MAGNESIUM: Magnesium: 2.5 mg/dL — ABNORMAL HIGH (ref 1.7–2.4)

## 2023-10-11 LAB — GLUCOSE, CAPILLARY: Glucose-Capillary: 140 mg/dL — ABNORMAL HIGH (ref 70–99)

## 2023-10-11 LAB — PROTIME-INR
INR: 1.1 (ref 0.8–1.2)
Prothrombin Time: 14.1 s (ref 11.4–15.2)

## 2023-10-11 MED ORDER — FERROUS SULFATE 325 (65 FE) MG PO TABS
325.0000 mg | ORAL_TABLET | Freq: Every day | ORAL | Status: DC
  Administered 2023-10-12: 325 mg via ORAL
  Filled 2023-10-11: qty 1

## 2023-10-11 MED ORDER — FOLIC ACID 1 MG PO TABS
500.0000 ug | ORAL_TABLET | Freq: Every day | ORAL | Status: DC
  Administered 2023-10-11 – 2023-10-12 (×2): 0.5 mg via ORAL
  Filled 2023-10-11 (×2): qty 1

## 2023-10-11 MED ORDER — WARFARIN SODIUM 5 MG PO TABS
10.0000 mg | ORAL_TABLET | Freq: Once | ORAL | Status: AC
  Administered 2023-10-11: 10 mg via ORAL
  Filled 2023-10-11: qty 2

## 2023-10-11 MED ORDER — HYDROXYZINE HCL 25 MG PO TABS
25.0000 mg | ORAL_TABLET | Freq: Four times a day (QID) | ORAL | Status: DC | PRN
  Administered 2023-10-12: 25 mg via ORAL
  Filled 2023-10-11: qty 1

## 2023-10-11 MED ORDER — SPIRONOLACTONE 25 MG PO TABS
25.0000 mg | ORAL_TABLET | Freq: Every morning | ORAL | Status: DC
  Administered 2023-10-11 – 2023-10-12 (×2): 25 mg via ORAL
  Filled 2023-10-11 (×2): qty 1

## 2023-10-11 MED ORDER — BUSPIRONE HCL 5 MG PO TABS
15.0000 mg | ORAL_TABLET | Freq: Three times a day (TID) | ORAL | Status: DC
  Administered 2023-10-11 – 2023-10-12 (×3): 15 mg via ORAL
  Filled 2023-10-11 (×3): qty 3

## 2023-10-11 MED ORDER — SERTRALINE HCL 50 MG PO TABS
50.0000 mg | ORAL_TABLET | Freq: Every day | ORAL | Status: DC
  Administered 2023-10-11 – 2023-10-12 (×2): 50 mg via ORAL
  Filled 2023-10-11 (×2): qty 1
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
## 2023-10-11 NOTE — Progress Notes (Addendum)
PHARMACY - ANTICOAGULATION CONSULT NOTE  Pharmacy Consult for warfarin/lovenox bridge Indication: mechanical mitral valve  Allergies  Allergen Reactions   Bee Venom Anaphylaxis   Lisinopril Anaphylaxis, Shortness Of Breath, Swelling and Other (See Comments)    Throat swells   Penicillins Shortness Of Breath, Swelling and Other (See Comments)    Has patient had a PCN reaction causing immediate rash, facial/tongue/throat swelling, SOB or lightheadedness with hypotension: Yes Has patient had a PCN reaction causing severe rash involving mucus membranes or skin necrosis: Yes Has patient had a PCN reaction that required hospitalization No Has patient had a PCN reaction occurring within the last 10 years: No If all of the above answers are "NO", then may proceed with Cephalosporin use.    Perflutren Lipid Microsphere Shortness Of Breath and Other (See Comments)    Chest Pain/Tightness, also   Shellfish Allergy Anaphylaxis   Contrast Media [Iodinated Contrast Media] Hives, Itching and Other (See Comments)    Itching and hives to throat, neck, face and arms.   No difficulty breathing.   This was given at [facility] approximately 2015. Contrast Dye    Patient Measurements: Height: 5\' 4"  (162.6 cm) Weight: 114.5 kg (252 lb 6.8 oz) IBW/kg (Calculated) : 54.7 Heparin Dosing Weight:   Vital Signs: Temp: 98.7 F (37.1 C) (01/28 0506) Temp Source: Oral (01/28 0506) BP: 115/82 (01/28 0506) Pulse Rate: 72 (01/28 0506)  Labs: Recent Labs    10/10/23 0242 10/10/23 0520 10/11/23 0438 10/11/23 0822  HGB 12.7  --  12.4  --   HCT 39.8  --  39.6  --   PLT 346  --  371  --   LABPROT 13.7  --   --  14.1  INR 1.0  --   --  1.1  CREATININE 0.70  --  0.87  --   TROPONINIHS 7 6  --   --     Estimated Creatinine Clearance: 103.5 mL/min (by C-G formula based on SCr of 0.87 mg/dL).   Medical History: Past Medical History:  Diagnosis Date   Anxiety    Arthritis    "lower  back" (04/04/2018)   Bipolar disorder (HCC)    Chronic bronchitis (HCC)    Chronic diastolic CHF (congestive heart failure) (HCC)    Chronic lower back pain    DDD (degenerative disc disease), lumbar    Depression    Dyspnea    GERD (gastroesophageal reflux disease)    Headache    "weekly" (04/04/2018), miragrains in past (04/03/2019)   Heart murmur    Hypertension    Mitral valve disease    Normocytic anemia    Obesity    Palpitations    Pericarditis    Pneumonia    Premature atrial contractions    Pulmonary hypertension (HCC)    PVC's (premature ventricular contractions)    a. h/o palpitations with event monitor in 03/2017 showing NSR with PACs/PVCs.   Recurrent mitral valve stenosis and regurgitation s/p mitral valve repair    S/P mitral valve repair 03/27/2013   Dr. Darcus Austin - [place], [place] - complex valvuloplasty including resuspension of entire posterior leaflet using Gore-tex neochords and 30 mm Edwards Physio ring annuloplasty   S/P redo mitral valve replacement with mechanical valve 04/05/2019   29 mm Sorin Carbomedics Optiform bileaflet mechanical valve   S/P tricuspid valve repair 04/05/2019   28 mm Edwards mc3 ring annuloplasty   Tobacco abuse    Tricuspid regurgitation     Medications:  Medications  Prior to Admission  Medication Sig Dispense Refill Last Dose/Taking   acetaminophen (TYLENOL) 325 MG tablet Take 2 tablets (650 mg total) by mouth every 6 (six) hours as needed for mild pain.      albuterol (VENTOLIN HFA) 108 (90 Base) MCG/ACT inhaler Inhale 2 puffs into the lungs every 6 (six) hours as needed for wheezing or shortness of breath. 18 g 2    aspirin EC 81 MG tablet Take 1 tablet (81 mg total) by mouth daily with breakfast. 30 tablet 2    busPIRone (BUSPAR) 15 MG tablet Take 1 tablet (15 mg total) by mouth 3 (three) times daily. 90 tablet 3    carvedilol (COREG) 3.125 MG tablet Take 1 tablet (3.125 mg total) by mouth 2 (two) times daily with a meal. 60  tablet 5    Cholecalciferol (VITAMIN D-3 PO) Take 1 capsule by mouth daily.      cyanocobalamin (VITAMIN B12) 1000 MCG tablet Take 1 tablet (1,000 mcg total) by mouth daily. 30 tablet 3    dapagliflozin propanediol (FARXIGA) 10 MG TABS tablet Take 1 tablet (10 mg total) by mouth daily. 30 tablet 5    enoxaparin (LOVENOX) 100 MG/ML injection Inject 1 mL (100 mg total) into the skin every 12 (twelve) hours for 4 days. 8 mL 0    ferrous sulfate 325 (65 FE) MG tablet Take 1 tablet (325 mg total) by mouth daily with breakfast. 90 tablet 3    folic acid (FOLVITE) 400 MCG tablet Take 1 tablet (400 mcg total) by mouth daily. 30 tablet 5    furosemide (LASIX) 80 MG tablet Take 1 tablet (80 mg total) by mouth daily. 30 tablet 11    hydrOXYzine (ATARAX) 25 MG tablet Take 1 tablet (25 mg total) by mouth every 6 (six) hours as needed for anxiety. 90 tablet 3    Omega-3 Fatty Acids (OMEGA-3 FISH OIL PO) Take 1 capsule by mouth daily at 6 (six) AM.      sertraline (ZOLOFT) 50 MG tablet Take 1 tablet (50 mg total) by mouth daily. 30 tablet 2    spironolactone (ALDACTONE) 25 MG tablet Take 1 tablet (25 mg total) by mouth in the morning. 30 tablet 3    traZODone (DESYREL) 100 MG tablet Take 1 tablet (100 mg total) by mouth at bedtime. 30 tablet 5    warfarin (COUMADIN) 5 MG tablet Take 2 tablets (10 mg total) by mouth one time only at 4 PM. 60 tablet 2     Assessment: Pharmacy consulted to dose lovenox and warfarin for patient with mitral valve replacement in 2020.  INR is subtherapeutic on admission at 1.0 due to noncompliance.  Med rec pending but last pharmacy fill showed patient taking 10 mg daily.  INR 1.0 > 1.1  Goal of Therapy:  INR 2.5-3.5 Monitor platelets by anticoagulation protocol: Yes   Plan:  Lovenox 115 mg subq every 12 hours ( 1 mg/kg) until INR >2 Warfarin 10 mg x 1 dose Monitor daily INR and s/s of bleeding  Judeth Cornfield, PharmD Clinical Pharmacist 10/11/2023 9:14 AM

## 2023-10-11 NOTE — Discharge Instructions (Signed)
Rent/Utilities/Housing  Agency: [employer] Address: [address] [address] [place], [place] [address]  [address] [place], [place] [address]  Phone Number: [phone] or [phone] For the hearing-impaired - Dial 711 for [employer] [place] Services Agency Name: Miguel Aschoff. Dept. of Health and Human Services Address: [address], [place], [place] [address] Phone: [phone] Website: [email or web address] Services Offered: Temporary financial assistance, subsidized housing, and utility  assistance  Agency Name: [employer]  Address: [address] [place], [place] [address] Phone: [phone] ext. [phone] Email: Contact: info@newrha .org Website: FootballPromos.co.nz Services Offered: Subsidized apartment rent based on income.  Agency Name: [facility] Ministry Address: [facility], [address] [place], [place] Phone: [phone]  Website: www.ccmeden.org Services Offered: Museum/gallery curator, utility assistance Technical brewer for all of  [facility], [employer], [employer], [employer]  August 19, 2020 13 and [address] for [place] area only), rent assistance.  Agency Name: [facility] Address: [address] [place], [place], [place] [address] / [address]  [place] Phone: [phone] [place] / [phone] [place] Website: OpinionTrades.tn NetworkAffair.co.za Services Offered: Civil Service fast streamer, food, showers, hygiene products utility payment  assistance, thrift shops, rental assistance, Support Groups Agency Name: [facility]  Address: [address] [place], [place] [address]  Phone: [phone] / [phone] Website: https://www.[facility]markrecovery.org/ Services Offered: Support groups for Bipolar, substance abuse, anger  management, panic depression and anxiety. Mobile crisis unit,  outpatient therapy, substance abuse treatment. Agency Name: [facility]   Address: [address] [place], [place] [address]  Phone: [phone] Website: www.helpinc-centeragainstviolence.org Services Offered: Support groups for domestic violence or sexual assault, support  group for elderly women and domestic assault.   Transportation  Agency Name: [facility] [place] Address: [address] [place], [place] [address] Phone: [phone] Website: www.adtsrc.org Services Offered: Meals on [employer]. Home care, at home assisted  living, volunteer services, [facility],  RCATS and SKAT transportation system.  Agency Name: [facility] Address: [address] [place], [place] [address] Phone: [phone] Website: www.[place]transportation.com Services Offered: Transportation for a fee.  Providers Accepting New Patients in [place], [place]    [facility] [address]  [place], [place] [address] [phone] Accepts most insurances  [facility] [address] [place], [place] [address] [phone] Accepts most insurances  [facility] [address] [place], [place] [address]  [phone] Must meet requirements  [facility] [address] [place], [place] [address] [phone] Accepts most insurances  [facility] [address]  [place], [place] [address] [phone] Accepts most insurances  [facility] [address]   [place], [place]   [phone] Accepts most insurances  [facility])  [id number]  [place], [place] [address] [phone] Accepts most insurances     [facility] [address]  [place], [place] [address] [phone] Accepts most insurances  [facility] [address] [place], [place] [address] [phone] option 1 Accepts  Medicaid and [facility] [address]  [place], [place] [address] [phone] Accepts most insurances  [place], [place] [address] [place], [place] [address] [phone] Accepts most insurances  [facility] at [facility] [address]  [place], [place] [address] [phone] Accepts most insurances  [facility] [phone]  [address] [place], [place] [address] [phone] Accepts most insurances  [place], MD [address] [place] [address] [place], [place] [address] [phone]  Accepts most insurances
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
## 2023-10-11 NOTE — Plan of Care (Signed)
Patient alert and oriented through out this shift, patient compliant with fluid restrictions.

## 2023-10-12 DIAGNOSIS — Z720 Tobacco use: Secondary | ICD-10-CM | POA: Diagnosis not present

## 2023-10-12 DIAGNOSIS — I38 Endocarditis, valve unspecified: Secondary | ICD-10-CM | POA: Diagnosis not present

## 2023-10-12 DIAGNOSIS — I5023 Acute on chronic systolic (congestive) heart failure: Secondary | ICD-10-CM | POA: Diagnosis not present

## 2023-10-12 DIAGNOSIS — J9601 Acute respiratory failure with hypoxia: Secondary | ICD-10-CM | POA: Diagnosis not present

## 2023-10-12 LAB — BASIC METABOLIC PANEL
Anion gap: 9 (ref 5–15)
BUN: 20 mg/dL (ref 6–20)
CO2: 25 mmol/L (ref 22–32)
Calcium: 9.1 mg/dL (ref 8.9–10.3)
Chloride: 104 mmol/L (ref 98–111)
Creatinine, Ser: 0.89 mg/dL (ref 0.44–1.00)
GFR, Estimated: >60 mL/min (ref >60)
Glucose, Bld: 69 mg/dL — ABNORMAL LOW (ref 70–99)
Potassium: 3.9 mmol/L (ref 3.5–5.1)
Sodium: 138 mmol/L (ref 135–145)

## 2023-10-12 LAB — CBC
HCT: 45.3 % (ref 36.0–46.0)
Hemoglobin: 14.3 g/dL (ref 12.0–15.0)
MCH: 25.6 pg — ABNORMAL LOW (ref 26.0–34.0)
MCHC: 31.6 g/dL (ref 30.0–36.0)
MCV: 81.2 fL (ref 80.0–100.0)
Platelets: 409 10*3/uL — ABNORMAL HIGH (ref 150–400)
RBC: 5.58 MIL/uL — ABNORMAL HIGH (ref 3.87–5.11)
RDW: 19.1 % — ABNORMAL HIGH (ref 11.5–15.5)
WBC: 8.8 10*3/uL (ref 4.0–10.5)
nRBC: 0 % (ref 0.0–0.2)

## 2023-10-12 LAB — PROTIME-INR
INR: 1.2 (ref 0.8–1.2)
Prothrombin Time: 15 s (ref 11.4–15.2)

## 2023-10-12 LAB — MAGNESIUM: Magnesium: 2.4 mg/dL (ref 1.7–2.4)

## 2023-10-12 MED ORDER — WARFARIN SODIUM 5 MG PO TABS: 10.0000 mg | ORAL_TABLET | Freq: Once | ORAL | Status: DC

## 2023-10-12 MED ORDER — WARFARIN SODIUM 5 MG PO TABS: 10.0000 mg | ORAL_TABLET | Freq: Every day | ORAL | 0 refills | Status: DC

## 2023-10-12 NOTE — Progress Notes (Signed)
Patient discharged via wheelchair. VSS. Discharge instructions explained. IVs removed.

## 2023-10-12 NOTE — Progress Notes (Signed)
PHARMACY - ANTICOAGULATION CONSULT NOTE  Pharmacy Consult for warfarin/lovenox bridge Indication: mechanical mitral valve  Allergies  Allergen Reactions   Bee Venom Anaphylaxis   Lisinopril Anaphylaxis, Shortness Of Breath, Swelling and Other (See Comments)    Throat swells   Penicillins Shortness Of Breath, Swelling and Other (See Comments)    Has patient had a PCN reaction causing immediate rash, facial/tongue/throat swelling, SOB or lightheadedness with hypotension: Yes Has patient had a PCN reaction causing severe rash involving mucus membranes or skin necrosis: Yes Has patient had a PCN reaction that required hospitalization No Has patient had a PCN reaction occurring within the last 10 years: No If all of the above answers are "NO", then may proceed with Cephalosporin use.    Perflutren Lipid Microsphere Shortness Of Breath and Other (See Comments)    Chest Pain/Tightness, also   Shellfish Allergy Anaphylaxis   Contrast Media [Iodinated Contrast Media] Hives, Itching and Other (See Comments)    Itching and hives to throat, neck, face and arms.   No difficulty breathing.   This was given at [facility] approximately 2015. Contrast Dye    Patient Measurements: Height: 5\' 4"  (162.6 cm) Weight: 112.4 kg (247 lb 12.8 oz) IBW/kg (Calculated) : 54.7 Heparin Dosing Weight:   Vital Signs: Temp: 98 F (36.7 C) (01/29 0546) Temp Source: Oral (01/29 0546) BP: 132/81 (01/29 0546) Pulse Rate: 74 (01/29 0546)  Labs: Recent Labs    10/10/23 0242 10/10/23 0520 10/11/23 0438 10/11/23 0822 10/12/23 0433  HGB 12.7  --  12.4  --  14.3  HCT 39.8  --  39.6  --  45.3  PLT 346  --  371  --  409*  LABPROT 13.7  --   --  14.1 15.0  INR 1.0  --   --  1.1 1.2  CREATININE 0.70  --  0.87  --  0.89  TROPONINIHS 7 6  --   --   --     Estimated Creatinine Clearance: 100.1 mL/min (by C-G formula based on SCr of 0.89 mg/dL).   Medical History: Past Medical History:   Diagnosis Date   Anxiety    Arthritis    "lower back" (04/04/2018)   Bipolar disorder (HCC)    Chronic bronchitis (HCC)    Chronic diastolic CHF (congestive heart failure) (HCC)    Chronic lower back pain    DDD (degenerative disc disease), lumbar    Depression    Dyspnea    GERD (gastroesophageal reflux disease)    Headache    "weekly" (04/04/2018), miragrains in past (04/03/2019)   Heart murmur    Hypertension    Mitral valve disease    Normocytic anemia    Obesity    Palpitations    Pericarditis    Pneumonia    Premature atrial contractions    Pulmonary hypertension (HCC)    PVC's (premature ventricular contractions)    a. h/o palpitations with event monitor in 03/2017 showing NSR with PACs/PVCs.   Recurrent mitral valve stenosis and regurgitation s/p mitral valve repair    S/P mitral valve repair 03/27/2013   Dr. Darcus Austin - [place], [place] - complex valvuloplasty including resuspension of entire posterior leaflet using Gore-tex neochords and 30 mm Edwards Physio ring annuloplasty   S/P redo mitral valve replacement with mechanical valve 04/05/2019   29 mm Sorin Carbomedics Optiform bileaflet mechanical valve   S/P tricuspid valve repair 04/05/2019   28 mm Edwards mc3 ring annuloplasty   Tobacco abuse  Tricuspid regurgitation     Medications:  Medications Prior to Admission  Medication Sig Dispense Refill Last Dose/Taking   acetaminophen (TYLENOL) 325 MG tablet Take 2 tablets (650 mg total) by mouth every 6 (six) hours as needed for mild pain.      albuterol (VENTOLIN HFA) 108 (90 Base) MCG/ACT inhaler Inhale 2 puffs into the lungs every 6 (six) hours as needed for wheezing or shortness of breath. 18 g 2    aspirin EC 81 MG tablet Take 1 tablet (81 mg total) by mouth daily with breakfast. 30 tablet 2    busPIRone (BUSPAR) 15 MG tablet Take 1 tablet (15 mg total) by mouth 3 (three) times daily. 90 tablet 3    carvedilol (COREG) 3.125 MG tablet Take 1 tablet (3.125  mg total) by mouth 2 (two) times daily with a meal. 60 tablet 5    Cholecalciferol (VITAMIN D-3 PO) Take 1 capsule by mouth daily.      cyanocobalamin (VITAMIN B12) 1000 MCG tablet Take 1 tablet (1,000 mcg total) by mouth daily. 30 tablet 3    dapagliflozin propanediol (FARXIGA) 10 MG TABS tablet Take 1 tablet (10 mg total) by mouth daily. 30 tablet 5    enoxaparin (LOVENOX) 100 MG/ML injection Inject 1 mL (100 mg total) into the skin every 12 (twelve) hours for 4 days. 8 mL 0    ferrous sulfate 325 (65 FE) MG tablet Take 1 tablet (325 mg total) by mouth daily with breakfast. 90 tablet 3    folic acid (FOLVITE) 400 MCG tablet Take 1 tablet (400 mcg total) by mouth daily. 30 tablet 5    furosemide (LASIX) 80 MG tablet Take 1 tablet (80 mg total) by mouth daily. 30 tablet 11    hydrOXYzine (ATARAX) 25 MG tablet Take 1 tablet (25 mg total) by mouth every 6 (six) hours as needed for anxiety. 90 tablet 3    Omega-3 Fatty Acids (OMEGA-3 FISH OIL PO) Take 1 capsule by mouth daily at 6 (six) AM.      sertraline (ZOLOFT) 50 MG tablet Take 1 tablet (50 mg total) by mouth daily. 30 tablet 2    spironolactone (ALDACTONE) 25 MG tablet Take 1 tablet (25 mg total) by mouth in the morning. 30 tablet 3    traZODone (DESYREL) 100 MG tablet Take 1 tablet (100 mg total) by mouth at bedtime. 30 tablet 5    warfarin (COUMADIN) 5 MG tablet Take 2 tablets (10 mg total) by mouth one time only at 4 PM. 60 tablet 2     Assessment: Pharmacy consulted to dose lovenox and warfarin for patient with mitral valve replacement in 2020.  INR is subtherapeutic on admission at 1.0 due to noncompliance.  Med rec pending but last pharmacy fill showed patient taking 10 mg daily.  INR 1.0 > 1.1> 1.2  Goal of Therapy:  INR 2.5-3.5 Monitor platelets by anticoagulation protocol: Yes   Plan:  Lovenox 115 mg subq every 12 hours ( 1 mg/kg) until INR >2 Warfarin 10 mg x 1 dose Monitor daily INR and s/s of bleeding  Judeth Cornfield,  PharmD Clinical Pharmacist 10/12/2023 8:52 AM

## 2023-10-12 NOTE — Plan of Care (Signed)
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
## 2023-10-12 NOTE — Inpatient Diabetes Management (Signed)
Inpatient Diabetes Program Recommendations  AACE/ADA: New Consensus Statement on Inpatient Glycemic Control (2015)  Target Ranges:  Prepandial:   less than 140 mg/dL      Peak postprandial:   less than 180 mg/dL (1-2 hours)      Critically ill patients:  140 - 180 mg/dL   Lab Results  Component Value Date   GLUCAP 140 (H) 10/11/2023   HGBA1C 5.9 (H) 04/03/2019    Review of Glycemic Control  Diabetes history: none  On Farxiga 10 mg Daily Current orders for Inpatient glycemic control:  Farxiga 10 mg Daily  Inpatient Diabetes Program Recommendations:    Note: hypoglycemia in labs. May want to consider d/cing Farxiga while inpatient.  Thanks,  Christena Deem RN, MSN, BC-ADM Inpatient Diabetes Coordinator Team Pager [phone] (8a-5p)

## 2023-10-25 ENCOUNTER — Inpatient Hospital Stay (HOSPITAL_COMMUNITY)
Admission: EM | Admit: 2023-10-25 | Discharge: 2023-10-27 | DRG: 896 | Disposition: A | Discharge status: Home / Self Care | Payer: MEDICAID | Attending: Family Medicine | Admitting: Family Medicine

## 2023-10-25 ENCOUNTER — Emergency Department (HOSPITAL_COMMUNITY): Payer: MEDICAID

## 2023-10-25 ENCOUNTER — Other Ambulatory Visit: Payer: Self-pay

## 2023-10-25 DIAGNOSIS — D72829 Elevated white blood cell count, unspecified: Secondary | ICD-10-CM | POA: Diagnosis present

## 2023-10-25 DIAGNOSIS — E66813 Obesity, class 3: Secondary | ICD-10-CM | POA: Diagnosis present

## 2023-10-25 DIAGNOSIS — Z6841 Body Mass Index (BMI) 40.0 and over, adult: Secondary | ICD-10-CM | POA: Diagnosis not present

## 2023-10-25 DIAGNOSIS — Z79899 Other long term (current) drug therapy: Secondary | ICD-10-CM

## 2023-10-25 DIAGNOSIS — M479 Spondylosis, unspecified: Secondary | ICD-10-CM | POA: Diagnosis present

## 2023-10-25 DIAGNOSIS — F419 Anxiety disorder, unspecified: Secondary | ICD-10-CM | POA: Diagnosis present

## 2023-10-25 DIAGNOSIS — F1910 Other psychoactive substance abuse, uncomplicated: Secondary | ICD-10-CM | POA: Diagnosis not present

## 2023-10-25 DIAGNOSIS — M51369 Other intervertebral disc degeneration, lumbar region without mention of lumbar back pain or lower extremity pain: Secondary | ICD-10-CM | POA: Diagnosis present

## 2023-10-25 DIAGNOSIS — D696 Thrombocytopenia, unspecified: Secondary | ICD-10-CM

## 2023-10-25 DIAGNOSIS — F319 Bipolar disorder, unspecified: Secondary | ICD-10-CM | POA: Diagnosis present

## 2023-10-25 DIAGNOSIS — Z90711 Acquired absence of uterus with remaining cervical stump: Secondary | ICD-10-CM

## 2023-10-25 DIAGNOSIS — I4892 Unspecified atrial flutter: Secondary | ICD-10-CM | POA: Diagnosis not present

## 2023-10-25 DIAGNOSIS — Z7984 Long term (current) use of oral hypoglycemic drugs: Secondary | ICD-10-CM

## 2023-10-25 DIAGNOSIS — E871 Hypo-osmolality and hyponatremia: Secondary | ICD-10-CM | POA: Diagnosis present

## 2023-10-25 DIAGNOSIS — I5031 Acute diastolic (congestive) heart failure: Secondary | ICD-10-CM | POA: Diagnosis not present

## 2023-10-25 DIAGNOSIS — R791 Abnormal coagulation profile: Secondary | ICD-10-CM | POA: Diagnosis present

## 2023-10-25 DIAGNOSIS — T405X1A Poisoning by cocaine, accidental (unintentional), initial encounter: Principal | ICD-10-CM | POA: Diagnosis present

## 2023-10-25 DIAGNOSIS — I4891 Unspecified atrial fibrillation: Secondary | ICD-10-CM | POA: Diagnosis present

## 2023-10-25 DIAGNOSIS — I2510 Atherosclerotic heart disease of native coronary artery without angina pectoris: Secondary | ICD-10-CM | POA: Diagnosis present

## 2023-10-25 DIAGNOSIS — Z9889 Other specified postprocedural states: Secondary | ICD-10-CM

## 2023-10-25 DIAGNOSIS — Z880 Allergy status to penicillin: Secondary | ICD-10-CM | POA: Diagnosis not present

## 2023-10-25 DIAGNOSIS — Z1152 Encounter for screening for COVID-19: Secondary | ICD-10-CM

## 2023-10-25 DIAGNOSIS — Z888 Allergy status to other drugs, medicaments and biological substances status: Secondary | ICD-10-CM

## 2023-10-25 DIAGNOSIS — F17210 Nicotine dependence, cigarettes, uncomplicated: Secondary | ICD-10-CM | POA: Diagnosis present

## 2023-10-25 DIAGNOSIS — I272 Pulmonary hypertension, unspecified: Secondary | ICD-10-CM | POA: Diagnosis present

## 2023-10-25 DIAGNOSIS — I10 Essential (primary) hypertension: Secondary | ICD-10-CM | POA: Diagnosis present

## 2023-10-25 DIAGNOSIS — F329 Major depressive disorder, single episode, unspecified: Secondary | ICD-10-CM | POA: Diagnosis present

## 2023-10-25 DIAGNOSIS — Z952 Presence of prosthetic heart valve: Secondary | ICD-10-CM | POA: Diagnosis not present

## 2023-10-25 DIAGNOSIS — Z7901 Long term (current) use of anticoagulants: Secondary | ICD-10-CM | POA: Diagnosis not present

## 2023-10-25 DIAGNOSIS — R0602 Shortness of breath: Secondary | ICD-10-CM | POA: Diagnosis present

## 2023-10-25 DIAGNOSIS — I110 Hypertensive heart disease with heart failure: Secondary | ICD-10-CM | POA: Diagnosis present

## 2023-10-25 DIAGNOSIS — F1410 Cocaine abuse, uncomplicated: Principal | ICD-10-CM | POA: Diagnosis present

## 2023-10-25 DIAGNOSIS — Z7982 Long term (current) use of aspirin: Secondary | ICD-10-CM

## 2023-10-25 DIAGNOSIS — Z8249 Family history of ischemic heart disease and other diseases of the circulatory system: Secondary | ICD-10-CM

## 2023-10-25 DIAGNOSIS — I5033 Acute on chronic diastolic (congestive) heart failure: Secondary | ICD-10-CM | POA: Diagnosis present

## 2023-10-25 DIAGNOSIS — Z91030 Bee allergy status: Secondary | ICD-10-CM

## 2023-10-25 DIAGNOSIS — Z7151 Drug abuse counseling and surveillance of drug abuser: Secondary | ICD-10-CM

## 2023-10-25 DIAGNOSIS — F1490 Cocaine use, unspecified, uncomplicated: Secondary | ICD-10-CM | POA: Diagnosis not present

## 2023-10-25 DIAGNOSIS — F418 Other specified anxiety disorders: Secondary | ICD-10-CM | POA: Diagnosis present

## 2023-10-25 DIAGNOSIS — I071 Rheumatic tricuspid insufficiency: Secondary | ICD-10-CM | POA: Diagnosis present

## 2023-10-25 DIAGNOSIS — Z90721 Acquired absence of ovaries, unilateral: Secondary | ICD-10-CM

## 2023-10-25 DIAGNOSIS — D6869 Other thrombophilia: Secondary | ICD-10-CM

## 2023-10-25 LAB — RESP PANEL BY RT-PCR (RSV, FLU A&B, COVID)  RVPGX2
Influenza A by PCR: NEGATIVE
Influenza B by PCR: NEGATIVE
Resp Syncytial Virus by PCR: NEGATIVE
SARS Coronavirus 2 by RT PCR: NEGATIVE

## 2023-10-25 LAB — CBC WITH DIFFERENTIAL/PLATELET
Abs Immature Granulocytes: 0.07 10*3/uL (ref 0.00–0.07)
Basophils Absolute: 0.1 10*3/uL (ref 0.0–0.1)
Basophils Relative: 0 %
Eosinophils Absolute: 0.3 10*3/uL (ref 0.0–0.5)
Eosinophils Relative: 2 %
HCT: 43.4 % (ref 36.0–46.0)
Hemoglobin: 13.8 g/dL (ref 12.0–15.0)
Immature Granulocytes: 0 %
Lymphocytes Relative: 16 %
Lymphs Abs: 2.8 10*3/uL (ref 0.7–4.0)
MCH: 25.7 pg — ABNORMAL LOW (ref 26.0–34.0)
MCHC: 31.8 g/dL (ref 30.0–36.0)
MCV: 80.8 fL (ref 80.0–100.0)
Monocytes Absolute: 1.1 10*3/uL — ABNORMAL HIGH (ref 0.1–1.0)
Monocytes Relative: 6 %
Neutro Abs: 13 10*3/uL — ABNORMAL HIGH (ref 1.7–7.7)
Neutrophils Relative %: 76 %
Platelets: 485 10*3/uL — ABNORMAL HIGH (ref 150–400)
RBC: 5.37 MIL/uL — ABNORMAL HIGH (ref 3.87–5.11)
RDW: 19.6 % — ABNORMAL HIGH (ref 11.5–15.5)
WBC: 17.3 10*3/uL — ABNORMAL HIGH (ref 4.0–10.5)
nRBC: 0 % (ref 0.0–0.2)

## 2023-10-25 LAB — COMPREHENSIVE METABOLIC PANEL
ALT: 11 U/L (ref 0–44)
AST: 21 U/L (ref 15–41)
Albumin: 3.9 g/dL (ref 3.5–5.0)
Alkaline Phosphatase: 52 U/L (ref 38–126)
Anion gap: 9 (ref 5–15)
BUN: 15 mg/dL (ref 6–20)
CO2: 22 mmol/L (ref 22–32)
Calcium: 9.1 mg/dL (ref 8.9–10.3)
Chloride: 100 mmol/L (ref 98–111)
Creatinine, Ser: 0.81 mg/dL (ref 0.44–1.00)
GFR, Estimated: >60 mL/min (ref >60)
Glucose, Bld: 128 mg/dL — ABNORMAL HIGH (ref 70–99)
Potassium: 3.6 mmol/L (ref 3.5–5.1)
Sodium: 131 mmol/L — ABNORMAL LOW (ref 135–145)
Total Bilirubin: 0.5 mg/dL (ref 0.0–1.2)
Total Protein: 8.3 g/dL — ABNORMAL HIGH (ref 6.5–8.1)

## 2023-10-25 LAB — MRSA NEXT GEN BY PCR, NASAL: MRSA by PCR Next Gen: NOT DETECTED

## 2023-10-25 LAB — RAPID URINE DRUG SCREEN, HOSP PERFORMED
Amphetamines: NOT DETECTED
Barbiturates: NOT DETECTED
Benzodiazepines: NOT DETECTED
Cocaine: POSITIVE — AB
Opiates: NOT DETECTED
Tetrahydrocannabinol: NOT DETECTED

## 2023-10-25 LAB — TROPONIN I (HIGH SENSITIVITY)
Troponin I (High Sensitivity): 86 ng/L — ABNORMAL HIGH (ref <18)
Troponin I (High Sensitivity): 104 ng/L (ref <18)

## 2023-10-25 LAB — BRAIN NATRIURETIC PEPTIDE: B Natriuretic Peptide: 444 pg/mL — ABNORMAL HIGH (ref 0.0–100.0)

## 2023-10-25 LAB — PROTIME-INR
INR: 4.7 (ref 0.8–1.2)
Prothrombin Time: 44.8 s — ABNORMAL HIGH (ref 11.4–15.2)

## 2023-10-25 LAB — HEPARIN LEVEL (UNFRACTIONATED): Heparin Unfractionated: <0.1 [IU]/mL — ABNORMAL LOW (ref 0.30–0.70)

## 2023-10-25 MED ORDER — LEVALBUTEROL HCL 0.63 MG/3ML IN NEBU: 0.6300 mg | INHALATION_SOLUTION | Freq: Four times a day (QID) | RESPIRATORY_TRACT | Status: DC | PRN

## 2023-10-25 MED ORDER — DILTIAZEM HCL-DEXTROSE 125-5 MG/125ML-% IV SOLN (PREMIX)
5.0000 mg/h | INTRAVENOUS | Status: DC
Start: 2023-10-25 — End: 2023-10-26
  Administered 2023-10-25: 5 mg/h via INTRAVENOUS
  Filled 2023-10-25: qty 125

## 2023-10-25 MED ORDER — HEPARIN (PORCINE) 25000 UT/250ML-% IV SOLN
1350.0000 [IU]/h | INTRAVENOUS | Status: DC
  Administered 2023-10-25: 1350 [IU]/h via INTRAVENOUS
  Filled 2023-10-25: qty 250

## 2023-10-25 MED ORDER — FOLIC ACID 1 MG PO TABS
1.0000 mg | ORAL_TABLET | Freq: Every day | ORAL | Status: DC
  Administered 2023-10-26 – 2023-10-27 (×2): 1 mg via ORAL
  Filled 2023-10-25 (×2): qty 1

## 2023-10-25 MED ORDER — ACETAMINOPHEN 650 MG RE SUPP: 650.0000 mg | Freq: Four times a day (QID) | RECTAL | Status: DC | PRN

## 2023-10-25 MED ORDER — CHLORHEXIDINE GLUCONATE CLOTH 2 % EX PADS
6.0000 | MEDICATED_PAD | Freq: Every day | CUTANEOUS | Status: DC
  Administered 2023-10-26 – 2023-10-27 (×2): 6 via TOPICAL

## 2023-10-25 MED ORDER — VITAMIN B-12 1000 MCG PO TABS
1000.0000 ug | ORAL_TABLET | Freq: Every day | ORAL | Status: DC
  Administered 2023-10-25 – 2023-10-27 (×3): 1000 ug via ORAL
  Filled 2023-10-25 (×3): qty 1

## 2023-10-25 MED ORDER — ACETAMINOPHEN 325 MG PO TABS: 650.0000 mg | ORAL_TABLET | Freq: Four times a day (QID) | ORAL | Status: DC | PRN

## 2023-10-25 MED ORDER — DILTIAZEM LOAD VIA INFUSION
15.0000 mg | Freq: Once | INTRAVENOUS | Status: AC
  Administered 2023-10-25: 15 mg via INTRAVENOUS
  Filled 2023-10-25: qty 15

## 2023-10-25 MED ORDER — SERTRALINE HCL 50 MG PO TABS
50.0000 mg | ORAL_TABLET | Freq: Every day | ORAL | Status: DC
  Administered 2023-10-26 – 2023-10-27 (×2): 50 mg via ORAL
  Filled 2023-10-25 (×2): qty 1

## 2023-10-25 MED ORDER — HEPARIN BOLUS VIA INFUSION
4000.0000 [IU] | Freq: Once | INTRAVENOUS | Status: AC
  Administered 2023-10-25: 4000 [IU] via INTRAVENOUS

## 2023-10-25 MED ORDER — ASPIRIN 81 MG PO TBEC
81.0000 mg | DELAYED_RELEASE_TABLET | Freq: Every day | ORAL | Status: DC
  Administered 2023-10-26 – 2023-10-27 (×2): 81 mg via ORAL
  Filled 2023-10-25 (×2): qty 1

## 2023-10-25 MED ORDER — ONDANSETRON HCL 4 MG PO TABS: 4.0000 mg | ORAL_TABLET | Freq: Four times a day (QID) | ORAL | Status: DC | PRN

## 2023-10-25 MED ORDER — FUROSEMIDE 10 MG/ML IJ SOLN
40.0000 mg | Freq: Once | INTRAMUSCULAR | Status: AC
  Administered 2023-10-25: 40 mg via INTRAVENOUS
  Filled 2023-10-25: qty 4

## 2023-10-25 MED ORDER — DAPAGLIFLOZIN PROPANEDIOL 10 MG PO TABS
10.0000 mg | ORAL_TABLET | Freq: Every day | ORAL | Status: DC
  Administered 2023-10-25 – 2023-10-27 (×3): 10 mg via ORAL
  Filled 2023-10-25 (×4): qty 1

## 2023-10-25 MED ORDER — INFLUENZA VIRUS VACC SPLIT PF (FLUZONE) 0.5 ML IM SUSY: 0.5000 mL | PREFILLED_SYRINGE | INTRAMUSCULAR | Status: DC

## 2023-10-25 MED ORDER — FERROUS SULFATE 325 (65 FE) MG PO TABS
325.0000 mg | ORAL_TABLET | Freq: Every day | ORAL | Status: DC
Start: 2023-10-26 — End: 2023-10-27
  Administered 2023-10-26 – 2023-10-27 (×2): 325 mg via ORAL
  Filled 2023-10-25 (×2): qty 1

## 2023-10-25 MED ORDER — DILTIAZEM HCL 60 MG PO TABS
90.0000 mg | ORAL_TABLET | Freq: Three times a day (TID) | ORAL | Status: DC
  Administered 2023-10-25 – 2023-10-26 (×3): 90 mg via ORAL
  Filled 2023-10-25 (×3): qty 1

## 2023-10-25 MED ORDER — HYDROXYZINE HCL 25 MG PO TABS
25.0000 mg | ORAL_TABLET | Freq: Four times a day (QID) | ORAL | Status: DC | PRN
  Administered 2023-10-26 – 2023-10-27 (×3): 25 mg via ORAL
  Filled 2023-10-25 (×3): qty 1

## 2023-10-25 MED ORDER — TRAZODONE HCL 50 MG PO TABS
100.0000 mg | ORAL_TABLET | Freq: Every day | ORAL | Status: DC
  Administered 2023-10-26: 100 mg via ORAL
  Filled 2023-10-25: qty 2

## 2023-10-25 MED ORDER — POTASSIUM CHLORIDE CRYS ER 20 MEQ PO TBCR
40.0000 meq | EXTENDED_RELEASE_TABLET | Freq: Once | ORAL | Status: AC
  Administered 2023-10-25: 40 meq via ORAL
  Filled 2023-10-25: qty 2

## 2023-10-25 MED ORDER — ONDANSETRON HCL 4 MG/2ML IJ SOLN: 4.0000 mg | Freq: Four times a day (QID) | INTRAMUSCULAR | Status: DC | PRN

## 2023-10-25 MED ORDER — BUSPIRONE HCL 5 MG PO TABS
15.0000 mg | ORAL_TABLET | Freq: Three times a day (TID) | ORAL | Status: DC
  Administered 2023-10-25 – 2023-10-27 (×6): 15 mg via ORAL
  Filled 2023-10-25 (×6): qty 3

## 2023-10-25 NOTE — ED Triage Notes (Signed)
Pt presents to the ED via [facility] with complaints of CP and SOB that began tonight. of significant cardiac hx. With EMS, the patient has had numerous cardiac arrhythmias en route from aflutter - afib- ST. A&Ox4 at this time. Denies fevers, chills, cough.   VS with EMS HR-162 77/49  20 RAC

## 2023-10-25 NOTE — Consult Note (Signed)
Cardiology Consultation   Patient ID: Theresa Crawford MRN: [id number]; DOB: Oct 25, 1980  Admit date: 10/25/2023 Date of Consult: 10/25/2023  PCP:  Patient, No Pcp Per   [facility] Providers Cardiologist:  Marjo Bicker, MD        Patient Profile:   Theresa Crawford is a 43 y.o. female with a hx of chronic HFpEF (EF 50-55% in 01/2022, at 50% by echo in 08/2022, at 60-65% in 03/2023), CAD (mild non-obstructive CAD by cath in 02/2019), mechanical mitral valve (prior repair in 2014 with MV replacement in 03/2019), history of TV repair with ring annuloplasty, pulmonary HTN, persistent atrial flutter (s/p TEE/DCCV in 01/2022), substance abuse (cocaine use) and suspected systemic sarcoidosis who is being seen 10/25/2023 for the evaluation of atrial flutter with RVR at the request of Dr. Sherryll Burger.  History of Present Illness:   Theresa Crawford was last examined by the cardiology service during admission in 03/2023 for an acute CHF exacerbation and was recommended to transition to Lasix 80 mg daily at discharge along with starting Farxiga 10 mg daily. She did have a subtherapeutic INR on admission and was bridged with Lovenox. Was also positive for cocaine on admission.  She has canceled cardiology follow-up visits in the interim.  Was most recently admitted to [facility] from 1/27 - 10/12/2023 for acute hypoxic respiratory failure in the setting of an acute CHF exacerbation and also recurrent cocaine use. INR was again subtherapeutic. Was discharged on ASA 81 mg daily, Coreg 3.125 mg twice daily, Farxiga 10 mg daily, Lasix 80 mg daily and Spironolactone 25 mg daily along with Coumadin for anticoagulation.  Notes at that time also mention she was homeless and social services was consulted.  She presented back to the ED during the early morning hours of 10/25/2023 for evaluation of chest pain and shortness of breath. In talking with the patient today, she reports being in her normal state of health  until 2 days ago when she developed chest discomfort and shortness of breath. Reports shortness of breath is mostly with activity. No specific orthopnea, PND, dizziness or presyncope.  She has noted some swelling along her lower extremities. Reports compliance with her medications. Denies any significant caffeine or alcohol intake but she does use cocaine every few weeks and last used the day prior to her symptoms starting.  Initial labs show WBC 17.3, Hgb 13.8, platelets 485, Na+ 131, K+ 3.6 and creatinine 0.81. AST 21 and ALT 11. INR at 4.7. Initial and repeat Hs troponin at 86 and 104.  BNP elevated at 444. UDS positive for cocaine. CXR showing stable cardiomegaly and mild CHF with mild bibasilar atelectasis  EKG appears most consistent with atrial flutter with RVR, heart rate 162.  She received IV Lasix 40 mg while in the ED and has been started on IV Cardizem for rate control. Rates are currently in the 90's to low 100's. Reports her breathing has improved and she denies any pain at this time.  Past Medical History:  Diagnosis Date   Anxiety    Arthritis    "lower back" (04/04/2018)   Bipolar disorder (HCC)    Chronic bronchitis (HCC)    Chronic diastolic CHF (congestive heart failure) (HCC)    Chronic lower back pain    DDD (degenerative disc disease), lumbar    Depression    Dyspnea    GERD (gastroesophageal reflux disease)    Headache    "weekly" (04/04/2018), miragrains in past (04/03/2019)   Heart  murmur    Hypertension    Mitral valve disease    Normocytic anemia    Obesity    Palpitations    Pericarditis    Pneumonia    Premature atrial contractions    Pulmonary hypertension (HCC)    PVC's (premature ventricular contractions)    a. h/o palpitations with event monitor in 03/2017 showing NSR with PACs/PVCs.   Recurrent mitral valve stenosis and regurgitation s/p mitral valve repair    S/P mitral valve repair 03/27/2013   Dr. Darcus Austin - [place], [place] - complex  valvuloplasty including resuspension of entire posterior leaflet using Gore-tex neochords and 30 mm Edwards Physio ring annuloplasty   S/P redo mitral valve replacement with mechanical valve 04/05/2019   29 mm Sorin Carbomedics Optiform bileaflet mechanical valve   S/P tricuspid valve repair 04/05/2019   28 mm Edwards mc3 ring annuloplasty   Tobacco abuse    Tricuspid regurgitation     Past Surgical History:  Procedure Laterality Date   ABDOMINAL HERNIA REPAIR  ~ 2011   CARDIAC CATHETERIZATION  2014   CARDIOVERSION N/A 01/14/2022   Procedure: CARDIOVERSION;  Surgeon: Antoine Poche, MD;  Location: [facility]S;  Service: Endoscopy;  Laterality: N/A;   CESAREAN SECTION  2004; 2009   HERNIA REPAIR     MITRAL VALVE REPAIR  03/27/2013   Dr. Darcus Austin - [place], [place]. - complex valvuloplasty including resuspension of posterior leaflet with 30 mm CE Physio ring annuloplasty   MITRAL VALVE REPLACEMENT N/A 04/05/2019   Procedure: Mitral Valve (Mv) Replacement using Carbomedics Optiform Valve size 29mm;  Surgeon: Purcell Nails, MD;  Location: [facility] OR;  Service: Open Heart Surgery;  Laterality: N/A;   MULTIPLE EXTRACTIONS WITH ALVEOLOPLASTY N/A 04/03/2018   Procedure: Extraction of tooth #30 with alveoloplasty and gross debridement of remaining teeth;  Surgeon: Charlynne Pander, DDS;  Location: [facility] OR;  Service: Oral Surgery;  Laterality: N/A;   PARTIAL HYSTERECTOMY  2011   PID w/problem with fallopian tubes, had left fallopian and left ovary removed, pt still having periods   RIGHT HEART CATH N/A 03/27/2019   Procedure: RIGHT HEART CATH;  Surgeon: Laurey Morale, MD;  Location: [facility] INVASIVE CV LAB;  Service: Cardiovascular;  Laterality: N/A;   RIGHT/LEFT HEART CATH AND CORONARY ANGIOGRAPHY N/A 03/05/2019   Procedure: RIGHT/LEFT HEART CATH AND CORONARY ANGIOGRAPHY;  Surgeon: Kathleene Hazel, MD;  Location: [facility] INVASIVE CV LAB;  Service: Cardiovascular;  Laterality: N/A;   TEE WITHOUT  CARDIOVERSION N/A 05/26/2016   Procedure: TRANSESOPHAGEAL ECHOCARDIOGRAM (TEE);  Surgeon: [name], MD;  Location: [facility] ENDO SUITE;  Service: Cardiovascular;  Laterality: N/A;   TEE WITHOUT CARDIOVERSION N/A 01/25/2018   Procedure: TRANSESOPHAGEAL ECHOCARDIOGRAM (TEE) WITH PROPOFOL;  Surgeon: Jonelle Sidle, MD;  Location: [facility] SUITE;  Service: Cardiovascular;  Laterality: N/A;   TEE WITHOUT CARDIOVERSION N/A 03/05/2019   Procedure: TRANSESOPHAGEAL ECHOCARDIOGRAM (TEE);  Surgeon: Pricilla Riffle, MD;  Location: [facility] ENDOSCOPY;  Service: Cardiovascular;  Laterality: N/A;   TEE WITHOUT CARDIOVERSION N/A 04/05/2019   Procedure: TRANSESOPHAGEAL ECHOCARDIOGRAM (TEE);  Surgeon: Purcell Nails, MD;  Location: [facility] OR;  Service: Open Heart Surgery;  Laterality: N/A;   TEE WITHOUT CARDIOVERSION N/A 01/14/2022   Procedure: TRANSESOPHAGEAL ECHOCARDIOGRAM (TEE);  Surgeon: Antoine Poche, MD;  Location: [facility]S;  Service: Endoscopy;  Laterality: N/A;   TRICUSPID VALVE REPLACEMENT N/A 04/05/2019   Procedure: Tricuspid Valve Repair using Edwards MC3 Tricuspid ring size 28mm;  Surgeon: Purcell Nails, MD;  Location: [facility] OR;  Service: Open Heart Surgery;  Laterality: N/A;     Home Medications:  Prior to Admission medications   Medication Sig Start Date End Date Taking? Authorizing Provider  acetaminophen (TYLENOL) 325 MG tablet Take 2 tablets (650 mg total) by mouth every 6 (six) hours as needed for mild pain. 04/13/19   Ardelle Balls, PA-C  albuterol (VENTOLIN HFA) 108 (90 Base) MCG/ACT inhaler Inhale 2 puffs into the lungs every 6 (six) hours as needed for wheezing or shortness of breath. 04/10/23   Shon Hale, MD  aspirin EC 81 MG tablet Take 1 tablet (81 mg total) by mouth daily with breakfast. 04/10/23 04/09/24  Shon Hale, MD  busPIRone (BUSPAR) 15 MG tablet Take 1 tablet (15 mg total) by mouth 3 (three) times daily. 04/10/23   Shon Hale, MD  carvedilol (COREG) 3.125 MG  tablet Take 1 tablet (3.125 mg total) by mouth 2 (two) times daily with a meal. 04/10/23   Emokpae, Courage, MD  Cholecalciferol (VITAMIN D-3 PO) Take 1 capsule by mouth daily.    [provider]  cyanocobalamin (VITAMIN B12) 1000 MCG tablet Take 1 tablet (1,000 mcg total) by mouth daily. 04/10/23   Shon Hale, MD  dapagliflozin propanediol (FARXIGA) 10 MG TABS tablet Take 1 tablet (10 mg total) by mouth daily. 04/11/23   Shon Hale, MD  ferrous sulfate 325 (65 FE) MG tablet Take 1 tablet (325 mg total) by mouth daily with breakfast. 04/10/23   Shon Hale, MD  folic acid (FOLVITE) 400 MCG tablet Take 1 tablet (400 mcg total) by mouth daily. 04/10/23   Shon Hale, MD  furosemide (LASIX) 80 MG tablet Take 1 tablet (80 mg total) by mouth daily. 04/10/23 04/09/24  Shon Hale, MD  hydrOXYzine (ATARAX) 25 MG tablet Take 1 tablet (25 mg total) by mouth every 6 (six) hours as needed for anxiety. 04/10/23   Shon Hale, MD  Omega-3 Fatty Acids (OMEGA-3 FISH OIL PO) Take 1 capsule by mouth daily at 6 (six) AM.    [provider]  potassium chloride SA (KLOR-CON M) 20 MEQ tablet Take 20 mEq by mouth 2 (two) times daily.    [provider]  sertraline (ZOLOFT) 50 MG tablet Take 1 tablet (50 mg total) by mouth daily. 04/10/23 04/09/24  Shon Hale, MD  spironolactone (ALDACTONE) 25 MG tablet Take 1 tablet (25 mg total) by mouth in the morning. 04/10/23   Shon Hale, MD  traZODone (DESYREL) 100 MG tablet Take 1 tablet (100 mg total) by mouth at bedtime. 04/10/23   Shon Hale, MD  warfarin (COUMADIN) 5 MG tablet Take 2 tablets (10 mg total) by mouth daily at 4 PM. 10/12/23   Cleora Fleet, MD    Inpatient Medications: Scheduled Meds:  Continuous Infusions:  diltiazem (CARDIZEM) infusion 5 mg/hr (10/25/23 0308)   PRN Meds:   Allergies:    Allergies  Allergen Reactions   Bee Venom Anaphylaxis   Lisinopril Anaphylaxis, Shortness Of  Breath, Swelling and Other (See Comments)    Throat swells   Penicillins Shortness Of Breath, Swelling and Other (See Comments)    Has patient had a PCN reaction causing immediate rash, facial/tongue/throat swelling, SOB or lightheadedness with hypotension: Yes Has patient had a PCN reaction causing severe rash involving mucus membranes or skin necrosis: Yes Has patient had a PCN reaction that required hospitalization No Has patient had a PCN reaction occurring within the last 10 years: No If all of the above answers are "  NO", then may proceed with Cephalosporin use.    Perflutren Lipid Microsphere Shortness Of Breath and Other (See Comments)    Chest Pain/Tightness, also   Shellfish Allergy Anaphylaxis   Contrast Media [Iodinated Contrast Media] Hives, Itching and Other (See Comments)    Itching and hives to throat, neck, face and arms.   No difficulty breathing.   This was given at [facility] approximately 2015. Contrast Dye    Social History:   Social History   Socioeconomic History   Marital status: Single    Spouse name: Not on file   Number of children: 2   Years of education: Not on file   Highest education level: Not on file  Occupational History   Not on file  Tobacco Use   Smoking status: Every Day    Current packs/day: 0.50    Average packs/day: 0.5 packs/day for 24.1 years (12.0 ttl pk-yrs)    Types: Cigarettes    Start date: 11/03/1999   Smokeless tobacco: Never  Vaping Use   Vaping status: Never Used  Substance and Sexual Activity   Alcohol use: Yes   Drug use: Yes    Types: Marijuana, Cocaine   Sexual activity: Not Currently  Other Topics Concern   Not on file  Social History Narrative   Not on file   Social Drivers of Health   Financial Resource Strain: Not on file  Food Insecurity: Patient Declined (10/10/2023)   Hunger Vital Sign    Worried About Running Out of Food in the Last Year: Patient declined    Ran Out of Food in the Last  Year: Patient declined  Transportation Needs: Patient Declined (10/10/2023)   PRAPARE - Administrator, Civil Service (Medical): Patient declined    Lack of Transportation (Non-Medical): Patient declined  Physical Activity: Not on file  Stress: Not on file  Social Connections: Not on file  Intimate Partner Violence: Unknown (10/10/2023)   Humiliation, Afraid, Rape, and Kick questionnaire    Fear of Current or Ex-Partner: Patient declined    Emotionally Abused: Patient declined    Physically Abused: No    Sexually Abused: Patient declined    Family History:    Family History  Problem Relation Age of Onset   Heart failure Father        just received LVAD   Heart disease Paternal Grandfather        stent placement     ROS:  Please see the history of present illness.   All other ROS reviewed and negative.     Physical Exam/Data:   Vitals:   10/25/23 0515 10/25/23 0600 10/25/23 0715 10/25/23 0800  BP: (!) 85/56 100/68 115/85   Pulse: 78 79 81   Resp: 19 (!) 21 17   Temp:    97.9 F (36.6 C)  TempSrc:    Oral  SpO2: 95% 94% 92%   Weight:      Height:        Intake/Output Summary (Last 24 hours) at 10/25/2023 0853 Last data filed at 10/25/2023 6962 Gross per 24 hour  Intake 83.6 ml  Output --  Net 83.6 ml      10/25/2023    2:38 AM 10/12/2023    5:46 AM 10/11/2023    8:00 AM  Last 3 Weights  Weight (lbs) 249 lb 9 oz 247 lb 12.8 oz 252 lb 6.8 oz  Weight (kg) 113.2 kg 112.401 kg 114.5 kg     Body mass  index is 42.84 kg/m.  General:  Well nourished, well developed female appearing in no acute distress HEENT: normal Neck: no JVD Vascular: No carotid bruits; Distal pulses 2+ bilaterally Cardiac:  normal S1, S2; Irregularly irregular.  Mechanical valve sounds noted. Lungs: Rales along bases bilaterally.  No wheezing or rhonchi. Abd: soft, nontender, no hepatomegaly  Ext: no pitting edema Musculoskeletal:  No deformities, BUE and BLE strength normal and  equal Skin: warm and dry  Neuro:  CNs 2-12 intact, no focal abnormalities noted Psych:  Normal affect   EKG:  The EKG was personally reviewed and demonstrates: Atrial flutter with RVR, heart rate 162. Telemetry:  Telemetry was personally reviewed and demonstrates: Atrial flutter, HR in 90's to low-100's. Frequent PVC's and couplets.   Relevant CV Studies:  Cardiac Catheterization: 02/2019 Prox RCA to Mid RCA lesion is 10% stenosed. Prox Cx to Mid Cx lesion is 20% stenosed. Mid LAD lesion is 10% stenosed.   1. Mild non-obstructive CAD  Echocardiogram: 03/2023 IMPRESSIONS     1. Left ventricular ejection fraction, by estimation, is 60 to 65%. The  left ventricle has normal function. Left ventricular endocardial border  not optimally defined to evaluate regional wall motion. Left ventricular  diastolic parameters are  indeterminate.   2. Right ventricular systolic function was not well visualized. The right  ventricular size is not well visualized. Tricuspid regurgitation signal is  inadequate for assessing PA pressure.   3. Sorin Carbomedics Optiform bileaflet mechanical valve size      29mm is in the MV position. Stable moderate gradient across the valve,  mean gradient 8 mmHg at HR of 83 bpm.      . The mitral valve has been repaired/replaced. No evidence of mitral  valve regurgitation. The mean mitral valve gradient is 8.0 mmHg.   4. 28 mm Edwards mc3 ring annuloplasty of the tricuspid valve. The  tricuspid valve is has been repaired/replaced.   5. The aortic valve was not well visualized. Aortic valve regurgitation  is not visualized. No aortic stenosis is present.   6. The inferior vena cava is normal in size with greater than 50%  respiratory variability, suggesting right atrial pressure of 3 mmHg.    Laboratory Data:  High Sensitivity Troponin:   Recent Labs  Lab 10/10/23 0242 10/10/23 0520 10/25/23 0241 10/25/23 0504  TROPONINIHS 7 6 86* 104*      Chemistry Recent Labs  Lab 10/25/23 0241  NA 131*  K 3.6  CL 100  CO2 22  GLUCOSE 128*  BUN 15  CREATININE 0.81  CALCIUM 9.1  GFRNONAA >60  ANIONGAP 9    Recent Labs  Lab 10/25/23 0241  PROT 8.3*  ALBUMIN 3.9  AST 21  ALT 11  ALKPHOS 52  BILITOT 0.5   Lipids No results for input(s): "CHOL", "TRIG", "HDL", "LABVLDL", "LDLCALC", "CHOLHDL" in the last 168 hours.  Hematology Recent Labs  Lab 10/25/23 0241  WBC 17.3*  RBC 5.37*  HGB 13.8  HCT 43.4  MCV 80.8  MCH 25.7*  MCHC 31.8  RDW 19.6*  PLT 485*   Thyroid No results for input(s): "TSH", "FREET4" in the last 168 hours.  BNP Recent Labs  Lab 10/25/23 0241  BNP 444.0*    DDimer No results for input(s): "DDIMER" in the last 168 hours.   Radiology/Studies:  DG Chest Portable 1 View Result Date: 10/25/2023 CLINICAL DATA:  Tachycardia. EXAM: PORTABLE CHEST 1 VIEW COMPARISON:  October 10, 2023 FINDINGS: Multiple sternal wires are noted. The  cardiac silhouette is mildly enlarged and unchanged in size. Artificial cardiac valves are seen. Mild diffusely increased lung markings are noted. Mild atelectatic changes are suspected within the bilateral lung bases. No pleural effusion or pneumothorax is identified. The visualized skeletal structures are unremarkable. IMPRESSION: 1. Evidence of prior median and cardiac valve replacement. 2. Stable cardiomegaly with mild congestive heart failure and mild bibasilar atelectasis. Electronically Signed   By: Aram Candela M.D.   On: 10/25/2023 03:49     Assessment and Plan:   1. Atrial Flutter with RVR - She has a history of atrial flutter with last known recurrence in 2023 and did require TEE/DCCV at that time. Found to be in recurrent atrial flutter with RVR and in the setting of cocaine use as she reports using the day prior to initiation of symptoms. - She is currently on IV Cardizem and rates are in the 90's to low 100's but do peak in the 140's at times. Would continue  IV Cardizem for now. If she does not convert to normal sinus rhythm, will likely require TEE/DCCV. While she is on Coumadin for anticoagulation, her INR was subtherapeutic less than 2 weeks ago, therefore would require TEE. PTA Coreg currently held given SBP in the 90's to low 100's. Will check TSH and Mg. Will also replace K+ as this was at 3.6 on admission and would keep ~ 4.0. Would also update an echocardiogram once rates improve.  2. Acute on Chronic HFpEF - Her EF was at 60 to 65% by echocardiogram in 03/2023. Will obtain an updated echocardiogram once rates improve. BNP was at 444 on admission and she did receive IV Lasix 40 mg this morning and reports frequent urination. Follow I's and O's along with daily weights. Suspect her acute CHF exacerbation is due to her atrial flutter with RVR. Would restart Farxiga 10 mg daily. Hold Spironolactone 25 mg daily for now given soft BP and to allow for further titration of AV nodal blocking agents.  3.  Elevated Troponin Values - Hs troponin values have been elevated at 86 and 104. Suspect this is secondary to demand ischemia in the setting of atrial flutter with RVR, cocaine use and her acute CHF exacerbation. Prior cardiac catheterization in 2020 only showed mild, nonobstructive CAD. Plan for an updated echocardiogram as discussed above.  4.  History of MVR/Prior TV repair - She underwent prior repair in 2014 with MV replacement in 03/2019 and also previously underwent TV repair with ring annuloplasty. Echocardiogram in 03/2023 showed a stable moderate gradient of 8 mmHg across the mechanical mitral valve. Plan for repeat imaging later this admission.   - INR at 4.7 on admission. Would consult pharmacy to assist with management.  5.  Substance use  - She does report using cocaine approximately 2 days ago.  Previously used 3 weeks prior to that.  The risk of use was reviewed and cessation advised.     Risk Assessment/Risk Scores:   CHA2DS2-VASc Score  = 4   This indicates a 4.8% annual risk of stroke. The patient's score is based upon: CHF History: 1 HTN History: 1 Diabetes History: 0 Stroke History: 0 Vascular Disease History: 1 Age Score: 0 Gender Score: 1   For questions or updates, please contact [facility] Please consult [email or web address] for contact info under    Signed, Ellsworth Lennox, PA-C  10/25/2023 8:53 AM

## 2023-10-25 NOTE — ED Provider Notes (Incomplete)
[place] EMERGENCY DEPARTMENT AT [facility] Provider Note   CSN: [id number] Arrival date & time: 10/25/23  0234     History {Add pertinent medical, surgical, social history, OB history to HPI:1} Chief Complaint  Patient presents with   Shortness of Breath   Chest Pain    Theresa Crawford is a 43 y.o. female.   Shortness of Breath Associated symptoms: chest pain   Chest Pain Associated symptoms: shortness of breath        Home Medications Prior to Admission medications   Medication Sig Start Date End Date Taking? Authorizing Provider  acetaminophen (TYLENOL) 325 MG tablet Take 2 tablets (650 mg total) by mouth every 6 (six) hours as needed for mild pain. 04/13/19   Ardelle Balls, PA-C  albuterol (VENTOLIN HFA) 108 (90 Base) MCG/ACT inhaler Inhale 2 puffs into the lungs every 6 (six) hours as needed for wheezing or shortness of breath. 04/10/23   Shon Hale, MD  aspirin EC 81 MG tablet Take 1 tablet (81 mg total) by mouth daily with breakfast. 04/10/23 04/09/24  Shon Hale, MD  busPIRone (BUSPAR) 15 MG tablet Take 1 tablet (15 mg total) by mouth 3 (three) times daily. 04/10/23   Shon Hale, MD  carvedilol (COREG) 3.125 MG tablet Take 1 tablet (3.125 mg total) by mouth 2 (two) times daily with a meal. 04/10/23   Emokpae, Courage, MD  Cholecalciferol (VITAMIN D-3 PO) Take 1 capsule by mouth daily.    [provider]  cyanocobalamin (VITAMIN B12) 1000 MCG tablet Take 1 tablet (1,000 mcg total) by mouth daily. 04/10/23   Shon Hale, MD  dapagliflozin propanediol (FARXIGA) 10 MG TABS tablet Take 1 tablet (10 mg total) by mouth daily. 04/11/23   Shon Hale, MD  ferrous sulfate 325 (65 FE) MG tablet Take 1 tablet (325 mg total) by mouth daily with breakfast. 04/10/23   Shon Hale, MD  folic acid (FOLVITE) 400 MCG tablet Take 1 tablet (400 mcg total) by mouth daily. 04/10/23   Shon Hale, MD  furosemide (LASIX) 80 MG tablet  Take 1 tablet (80 mg total) by mouth daily. 04/10/23 04/09/24  Shon Hale, MD  hydrOXYzine (ATARAX) 25 MG tablet Take 1 tablet (25 mg total) by mouth every 6 (six) hours as needed for anxiety. 04/10/23   Shon Hale, MD  Omega-3 Fatty Acids (OMEGA-3 FISH OIL PO) Take 1 capsule by mouth daily at 6 (six) AM.    [provider]  potassium chloride SA (KLOR-CON M) 20 MEQ tablet Take 20 mEq by mouth 2 (two) times daily.    [provider]  sertraline (ZOLOFT) 50 MG tablet Take 1 tablet (50 mg total) by mouth daily. 04/10/23 04/09/24  Shon Hale, MD  spironolactone (ALDACTONE) 25 MG tablet Take 1 tablet (25 mg total) by mouth in the morning. 04/10/23   Shon Hale, MD  traZODone (DESYREL) 100 MG tablet Take 1 tablet (100 mg total) by mouth at bedtime. 04/10/23   Shon Hale, MD  warfarin (COUMADIN) 5 MG tablet Take 2 tablets (10 mg total) by mouth daily at 4 PM. 10/12/23   Johnson, Clanford L, MD      Allergies    Bee venom, Lisinopril, Penicillins, Perflutren lipid microsphere, Shellfish allergy, and Contrast media [iodinated contrast media]    Review of Systems   Review of Systems  Respiratory:  Positive for shortness of breath.   Cardiovascular:  Positive for chest pain.    Physical Exam Updated Vital Signs BP 110/83 (  BP Location: Left Arm)   Pulse (!) 163   Temp 98 F (36.7 C) (Oral)   Resp (!) 22   Ht 5\' 4"  (1.626 m)   Wt 113.2 kg   LMP 09/14/2023   SpO2 98%   BMI 42.84 kg/m  Physical Exam  ED Results / Procedures / Treatments   Labs (all labs ordered are listed, but only abnormal results are displayed) Labs Reviewed  CBC WITH DIFFERENTIAL/PLATELET  COMPREHENSIVE METABOLIC PANEL  PROTIME-INR  BRAIN NATRIURETIC PEPTIDE  TROPONIN I (HIGH SENSITIVITY)    EKG None  Radiology No results found.  Procedures Procedures  {Document cardiac monitor, telemetry assessment procedure when appropriate:1}  Medications Ordered in  ED Medications - No data to display  ED Course/ Medical Decision Making/ A&P   {   Click here for ABCD2, HEART and other calculatorsREFRESH Note before signing :1}                              Medical Decision Making Amount and/or Complexity of Data Reviewed Labs: ordered. Radiology: ordered. ECG/medicine tests: ordered.   ***  {Document critical care time when appropriate:1} {Document review of labs and clinical decision tools ie heart score, Chads2Vasc2 etc:1}  {Document your independent review of radiology images, and any outside records:1} {Document your discussion with family members, caretakers, and with consultants:1} {Document social determinants of health affecting pt's care:1} {Document your decision making why or why not admission, treatments were needed:1} Final Clinical Impression(s) / ED Diagnoses Final diagnoses:  None    Rx / DC Orders ED Discharge Orders     None
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
## 2023-10-25 NOTE — ED Notes (Signed)
Patients fiance Baldo Ash contacted per patients request and given an update.

## 2023-10-25 NOTE — Progress Notes (Signed)
PHARMACY - ANTICOAGULATION CONSULT NOTE  Pharmacy Consult for Warfarin Indication: atrial fibrillation/MVR  Allergies  Allergen Reactions   Bee Venom Anaphylaxis   Lisinopril Anaphylaxis, Shortness Of Breath, Swelling and Other (See Comments)    Throat swells   Penicillins Shortness Of Breath, Swelling and Other (See Comments)    Has patient had a PCN reaction causing immediate rash, facial/tongue/throat swelling, SOB or lightheadedness with hypotension: Yes Has patient had a PCN reaction causing severe rash involving mucus membranes or skin necrosis: Yes Has patient had a PCN reaction that required hospitalization No Has patient had a PCN reaction occurring within the last 10 years: No If all of the above answers are "NO", then may proceed with Cephalosporin use.    Perflutren Lipid Microsphere Shortness Of Breath and Other (See Comments)    Chest Pain/Tightness, also   Shellfish Allergy Anaphylaxis   Contrast Media [Iodinated Contrast Media] Hives, Itching and Other (See Comments)    Itching and hives to throat, neck, face and arms.   No difficulty breathing.   This was given at [facility] approximately 2015. Contrast Dye    Patient Measurements: Height: 5\' 4"  (162.6 cm) Weight: 113.2 kg (249 lb 9 oz) IBW/kg (Calculated) : 54.7 Heparin Dosing Weight: 85 kg  Vital Signs: Temp: 97.9 F (36.6 C) (02/11 0800) Temp Source: Oral (02/11 0800) BP: 98/60 (02/11 1139) Pulse Rate: 67 (02/11 1139)  Labs: Recent Labs    10/25/23 0241 10/25/23 0504 10/25/23 0942  HGB 13.8  --   --   HCT 43.4  --   --   PLT 485*  --   --   LABPROT 44.8*  --   --   INR 4.7*  --   --   HEPARINUNFRC  --   --  <0.10*  CREATININE 0.81  --   --   TROPONINIHS 86* 104*  --     Estimated Creatinine Clearance: 110.4 mL/min (by C-G formula based on SCr of 0.81 mg/dL).   Medical History: Past Medical History:  Diagnosis Date   Anxiety    Arthritis    "lower back" (04/04/2018)    Bipolar disorder (HCC)    Chronic bronchitis (HCC)    Chronic diastolic CHF (congestive heart failure) (HCC)    Chronic lower back pain    DDD (degenerative disc disease), lumbar    Depression    Dyspnea    GERD (gastroesophageal reflux disease)    Headache    "weekly" (04/04/2018), miragrains in past (04/03/2019)   Heart murmur    Hypertension    Mitral valve disease    Normocytic anemia    Obesity    Palpitations    Pericarditis    Pneumonia    Premature atrial contractions    Pulmonary hypertension (HCC)    PVC's (premature ventricular contractions)    a. h/o palpitations with event monitor in 03/2017 showing NSR with PACs/PVCs.   Recurrent mitral valve stenosis and regurgitation s/p mitral valve repair    S/P mitral valve repair 03/27/2013   Dr. Darcus Austin - Natale Milch, PA - complex valvuloplasty including resuspension of entire posterior leaflet using Gore-tex neochords and 30 mm Edwards Physio ring annuloplasty   S/P redo mitral valve replacement with mechanical valve 04/05/2019   29 mm Sorin Carbomedics Optiform bileaflet mechanical valve   S/P tricuspid valve repair 04/05/2019   28 mm Edwards mc3 ring annuloplasty   Tobacco abuse    Tricuspid regurgitation     Medications:  No current facility-administered medications  on file prior to encounter.   Current Outpatient Medications on File Prior to Encounter  Medication Sig Dispense Refill   acetaminophen (TYLENOL) 325 MG tablet Take 2 tablets (650 mg total) by mouth every 6 (six) hours as needed for mild pain.     albuterol (VENTOLIN HFA) 108 (90 Base) MCG/ACT inhaler Inhale 2 puffs into the lungs every 6 (six) hours as needed for wheezing or shortness of breath. 18 g 2   aspirin EC 81 MG tablet Take 1 tablet (81 mg total) by mouth daily with breakfast. 30 tablet 2   busPIRone (BUSPAR) 15 MG tablet Take 1 tablet (15 mg total) by mouth 3 (three) times daily. 90 tablet 3   carvedilol (COREG) 3.125 MG tablet Take 1 tablet  (3.125 mg total) by mouth 2 (two) times daily with a meal. 60 tablet 5   Cholecalciferol (VITAMIN D-3 PO) Take 1 capsule by mouth daily.     cyanocobalamin (VITAMIN B12) 1000 MCG tablet Take 1 tablet (1,000 mcg total) by mouth daily. 30 tablet 3   dapagliflozin propanediol (FARXIGA) 10 MG TABS tablet Take 1 tablet (10 mg total) by mouth daily. 30 tablet 5   ferrous sulfate 325 (65 FE) MG tablet Take 1 tablet (325 mg total) by mouth daily with breakfast. 90 tablet 3   folic acid (FOLVITE) 400 MCG tablet Take 1 tablet (400 mcg total) by mouth daily. 30 tablet 5   furosemide (LASIX) 80 MG tablet Take 1 tablet (80 mg total) by mouth daily. 30 tablet 11   hydrOXYzine (ATARAX) 25 MG tablet Take 1 tablet (25 mg total) by mouth every 6 (six) hours as needed for anxiety. 90 tablet 3   Omega-3 Fatty Acids (OMEGA-3 FISH OIL PO) Take 1 capsule by mouth daily at 6 (six) AM.     potassium chloride SA (KLOR-CON M) 20 MEQ tablet Take 20 mEq by mouth 2 (two) times daily.     sertraline (ZOLOFT) 50 MG tablet Take 1 tablet (50 mg total) by mouth daily. 30 tablet 2   spironolactone (ALDACTONE) 25 MG tablet Take 1 tablet (25 mg total) by mouth in the morning. 30 tablet 3   traZODone (DESYREL) 100 MG tablet Take 1 tablet (100 mg total) by mouth at bedtime. 30 tablet 5   warfarin (COUMADIN) 5 MG tablet Take 2 tablets (10 mg total) by mouth daily at 4 PM. 30 tablet 0     Assessment: 43 y.o. female with chest pain and Afib, MD ordered heparin=> now continuing with warfarin.  Pt on chronic warfarin with supratherapeutic INR 4.7 for MVR on Coumadin.   Goal of Therapy:  Goal INR 2.5-3.5 Monitor platelets by anticoagulation protocol: Yes   Plan:  No Coumadin today DailyPT-INR Monitor for S/S of bleeding  Elder Cyphers, BS Pharm D, BCPS Clinical Pharmacist 10/25/2023,12:20 PM

## 2023-10-25 NOTE — Discharge Instructions (Signed)
  Providers Accepting New Patients in [place], [place]    [facility] [address]  [place], [place] [address] [phone] Accepts most insurances  [facility] [address] [place], [place] [address] [phone] Accepts most insurances  [facility] [address] [place], [place] [address]  [phone] Must meet requirements  [facility] [address] [place], [place] [address] [phone] Accepts most insurances  [facility] [address]  [place], [place] [address] [phone] Accepts most insurances  [facility] [address]   [place], [place]   [phone] Accepts most insurances  [facility])  [phone] [address]  [place], [place] [address] [phone] Accepts most insurances     [facility] [address]  [place], [place] [address] [phone] Accepts most insurances  [facility] [address] [place], [place] [address] [phone] option 1 Accepts Medicaid and [facility] [address]  [place], [place] [address] [phone] Accepts most insurances  Avon Gully, MD [address] [place], [place] [address] [phone] Accepts most insurances  [facility] [address]  [place], [place] [address] [phone] Accepts most insurances  [facility] [phone] [address] [place], [place] [address] [phone] Accepts most insurances  [place] [place] [address] [place], [place] [address] [phone]  Accepts most insurances

## 2023-10-25 NOTE — Progress Notes (Signed)
PHARMACY - ANTICOAGULATION CONSULT NOTE  Pharmacy Consult for Heparin Indication: atrial fibrillation  Allergies  Allergen Reactions   Bee Venom Anaphylaxis   Lisinopril Anaphylaxis, Shortness Of Breath, Swelling and Other (See Comments)    Throat swells   Penicillins Shortness Of Breath, Swelling and Other (See Comments)    Has patient had a PCN reaction causing immediate rash, facial/tongue/throat swelling, SOB or lightheadedness with hypotension: Yes Has patient had a PCN reaction causing severe rash involving mucus membranes or skin necrosis: Yes Has patient had a PCN reaction that required hospitalization No Has patient had a PCN reaction occurring within the last 10 years: No If all of the above answers are "NO", then may proceed with Cephalosporin use.    Perflutren Lipid Microsphere Shortness Of Breath and Other (See Comments)    Chest Pain/Tightness, also   Shellfish Allergy Anaphylaxis   Contrast Media [Iodinated Contrast Media] Hives, Itching and Other (See Comments)    Itching and hives to throat, neck, face and arms.   No difficulty breathing.   This was given at [facility] approximately 2015. Contrast Dye    Patient Measurements: Height: 5\' 4"  (162.6 cm) Weight: 113.2 kg (249 lb 9 oz) IBW/kg (Calculated) : 54.7 Heparin Dosing Weight: 85 kg  Vital Signs: Temp: 98 F (36.7 C) (02/11 0241) Temp Source: Oral (02/11 0241) BP: 110/83 (02/11 0241) Pulse Rate: 163 (02/11 0241)  Labs: No results for input(s): "HGB", "HCT", "PLT", "APTT", "LABPROT", "INR", "HEPARINUNFRC", "HEPRLOWMOCWT", "CREATININE", "CKTOTAL", "CKMB", "TROPONINIHS" in the last 72 hours.  Estimated Creatinine Clearance: 100.5 mL/min (by C-G formula based on SCr of 0.89 mg/dL).   Medical History: Past Medical History:  Diagnosis Date   Anxiety    Arthritis    "lower back" (04/04/2018)   Bipolar disorder (HCC)    Chronic bronchitis (HCC)    Chronic diastolic CHF (congestive heart  failure) (HCC)    Chronic lower back pain    DDD (degenerative disc disease), lumbar    Depression    Dyspnea    GERD (gastroesophageal reflux disease)    Headache    "weekly" (04/04/2018), miragrains in past (04/03/2019)   Heart murmur    Hypertension    Mitral valve disease    Normocytic anemia    Obesity    Palpitations    Pericarditis    Pneumonia    Premature atrial contractions    Pulmonary hypertension (HCC)    PVC's (premature ventricular contractions)    a. h/o palpitations with event monitor in 03/2017 showing NSR with PACs/PVCs.   Recurrent mitral valve stenosis and regurgitation s/p mitral valve repair    S/P mitral valve repair 03/27/2013   Dr. Darcus Austin - Natale Milch, PA - complex valvuloplasty including resuspension of entire posterior leaflet using Gore-tex neochords and 30 mm Edwards Physio ring annuloplasty   S/P redo mitral valve replacement with mechanical valve 04/05/2019   29 mm Sorin Carbomedics Optiform bileaflet mechanical valve   S/P tricuspid valve repair 04/05/2019   28 mm Edwards mc3 ring annuloplasty   Tobacco abuse    Tricuspid regurgitation     Medications:  No current facility-administered medications on file prior to encounter.   Current Outpatient Medications on File Prior to Encounter  Medication Sig Dispense Refill   acetaminophen (TYLENOL) 325 MG tablet Take 2 tablets (650 mg total) by mouth every 6 (six) hours as needed for mild pain.     albuterol (VENTOLIN HFA) 108 (90 Base) MCG/ACT inhaler Inhale 2 puffs into the lungs  every 6 (six) hours as needed for wheezing or shortness of breath. 18 g 2   aspirin EC 81 MG tablet Take 1 tablet (81 mg total) by mouth daily with breakfast. 30 tablet 2   busPIRone (BUSPAR) 15 MG tablet Take 1 tablet (15 mg total) by mouth 3 (three) times daily. 90 tablet 3   carvedilol (COREG) 3.125 MG tablet Take 1 tablet (3.125 mg total) by mouth 2 (two) times daily with a meal. 60 tablet 5   Cholecalciferol  (VITAMIN D-3 PO) Take 1 capsule by mouth daily.     cyanocobalamin (VITAMIN B12) 1000 MCG tablet Take 1 tablet (1,000 mcg total) by mouth daily. 30 tablet 3   dapagliflozin propanediol (FARXIGA) 10 MG TABS tablet Take 1 tablet (10 mg total) by mouth daily. 30 tablet 5   ferrous sulfate 325 (65 FE) MG tablet Take 1 tablet (325 mg total) by mouth daily with breakfast. 90 tablet 3   folic acid (FOLVITE) 400 MCG tablet Take 1 tablet (400 mcg total) by mouth daily. 30 tablet 5   furosemide (LASIX) 80 MG tablet Take 1 tablet (80 mg total) by mouth daily. 30 tablet 11   hydrOXYzine (ATARAX) 25 MG tablet Take 1 tablet (25 mg total) by mouth every 6 (six) hours as needed for anxiety. 90 tablet 3   Omega-3 Fatty Acids (OMEGA-3 FISH OIL PO) Take 1 capsule by mouth daily at 6 (six) AM.     potassium chloride SA (KLOR-CON M) 20 MEQ tablet Take 20 mEq by mouth 2 (two) times daily.     sertraline (ZOLOFT) 50 MG tablet Take 1 tablet (50 mg total) by mouth daily. 30 tablet 2   spironolactone (ALDACTONE) 25 MG tablet Take 1 tablet (25 mg total) by mouth in the morning. 30 tablet 3   traZODone (DESYREL) 100 MG tablet Take 1 tablet (100 mg total) by mouth at bedtime. 30 tablet 5   warfarin (COUMADIN) 5 MG tablet Take 2 tablets (10 mg total) by mouth daily at 4 PM. 30 tablet 0     Assessment: 43 y.o. female with chest pain and Afib for heparin Goal of Therapy:  Heparin level 0.3-0.7 units/ml Monitor platelets by anticoagulation protocol: Yes   Plan:  Heparin 4000 units IV bolus, then start heparin 1350 units/hr Check heparin level in 6 hours.   Theresa Crawford 10/25/2023,3:01 AM

## 2023-10-25 NOTE — ED Notes (Signed)
Report given to Norberta Keens, ICU

## 2023-10-25 NOTE — TOC Progression Note (Signed)
Transition of Care [facility]) - Progression Note    Patient Details  Name: Theresa Crawford MRN: [id number] Date of Birth: 1981-01-20  [facility] CM/SW Contact  Leitha Bleak, RN Phone Number: 10/25/2023, 1:33 PM  Clinical Narrative:   Patient is waiting in ED and continuing work up to get an inpatient bed. Patient has multiple consults for Cocaine. This admission she comes in paranoid after of boyfriend coming down off drugs. Patient now having RN give boyfriend updates.  TOC following to visit after med work up and placement on the floor.    PCP added to AVS. MD and patient requesting a PCP list.         Expected Discharge Plan and Services    Social Determinants of Health (SDOH) Interventions SDOH Screenings   Food Insecurity: Patient Declined (10/10/2023)  Housing: High Risk (10/10/2023)  Transportation Needs: Patient Declined (10/10/2023)  Utilities: Patient Declined (10/10/2023)  Alcohol Screen: Low Risk  (04/05/2018)  Depression (PHQ2-9): Low Risk  (10/31/2019)  Tobacco Use: High Risk (10/10/2023)

## 2023-10-26 ENCOUNTER — Inpatient Hospital Stay (HOSPITAL_COMMUNITY): Payer: MEDICAID

## 2023-10-26 DIAGNOSIS — F1490 Cocaine use, unspecified, uncomplicated: Secondary | ICD-10-CM | POA: Diagnosis not present

## 2023-10-26 DIAGNOSIS — F1410 Cocaine abuse, uncomplicated: Secondary | ICD-10-CM

## 2023-10-26 DIAGNOSIS — Z952 Presence of prosthetic heart valve: Secondary | ICD-10-CM | POA: Diagnosis not present

## 2023-10-26 DIAGNOSIS — I5031 Acute diastolic (congestive) heart failure: Secondary | ICD-10-CM

## 2023-10-26 DIAGNOSIS — I4892 Unspecified atrial flutter: Secondary | ICD-10-CM | POA: Diagnosis not present

## 2023-10-26 DIAGNOSIS — R791 Abnormal coagulation profile: Secondary | ICD-10-CM

## 2023-10-26 DIAGNOSIS — I5033 Acute on chronic diastolic (congestive) heart failure: Secondary | ICD-10-CM | POA: Diagnosis not present

## 2023-10-26 LAB — CBC
HCT: 40.2 % (ref 36.0–46.0)
Hemoglobin: 12.1 g/dL (ref 12.0–15.0)
MCH: 25.5 pg — ABNORMAL LOW (ref 26.0–34.0)
MCHC: 30.1 g/dL (ref 30.0–36.0)
MCV: 84.6 fL (ref 80.0–100.0)
Platelets: 377 10*3/uL (ref 150–400)
RBC: 4.75 MIL/uL (ref 3.87–5.11)
RDW: 19 % — ABNORMAL HIGH (ref 11.5–15.5)
WBC: 9.7 10*3/uL (ref 4.0–10.5)
nRBC: 0 % (ref 0.0–0.2)

## 2023-10-26 LAB — ECHOCARDIOGRAM COMPLETE
Area-P 1/2: 3.85 cm2
Calc EF: 42.4 %
Height: 64 in
MV VTI: 1.62 cm2
S' Lateral: 3.5 cm
Single Plane A2C EF: 42.6 %
Single Plane A4C EF: 43.3 %
Weight: 4024.72 [oz_av]

## 2023-10-26 LAB — PROTIME-INR
INR: 4.3 (ref 0.8–1.2)
Prothrombin Time: 41.2 s — ABNORMAL HIGH (ref 11.4–15.2)

## 2023-10-26 LAB — BASIC METABOLIC PANEL
Anion gap: 8 (ref 5–15)
BUN: 16 mg/dL (ref 6–20)
CO2: 24 mmol/L (ref 22–32)
Calcium: 8.6 mg/dL — ABNORMAL LOW (ref 8.9–10.3)
Chloride: 103 mmol/L (ref 98–111)
Creatinine, Ser: 0.78 mg/dL (ref 0.44–1.00)
GFR, Estimated: >60 mL/min (ref >60)
Glucose, Bld: 149 mg/dL — ABNORMAL HIGH (ref 70–99)
Potassium: 4.2 mmol/L (ref 3.5–5.1)
Sodium: 135 mmol/L (ref 135–145)

## 2023-10-26 LAB — MAGNESIUM: Magnesium: 2.2 mg/dL (ref 1.7–2.4)

## 2023-10-26 LAB — TSH: TSH: 0.581 u[IU]/mL (ref 0.350–4.500)

## 2023-10-26 MED ORDER — PERFLUTREN LIPID MICROSPHERE
1.0000 mL | INTRAVENOUS | Status: AC | PRN
  Administered 2023-10-26: 1 mL via INTRAVENOUS

## 2023-10-26 MED ORDER — DOCUSATE SODIUM 100 MG PO CAPS: 100.0000 mg | ORAL_CAPSULE | Freq: Two times a day (BID) | ORAL | Status: DC | PRN

## 2023-10-26 MED ORDER — FLEET ENEMA RE ENEM: 1.0000 | ENEMA | Freq: Every day | RECTAL | Status: DC | PRN

## 2023-10-26 MED ORDER — DILTIAZEM HCL ER COATED BEADS 300 MG PO CP24
300.0000 mg | ORAL_CAPSULE | Freq: Every day | ORAL | Status: DC
  Administered 2023-10-26 – 2023-10-27 (×2): 300 mg via ORAL
  Filled 2023-10-26 (×2): qty 1

## 2023-10-26 MED ORDER — BISACODYL 5 MG PO TBEC
10.0000 mg | DELAYED_RELEASE_TABLET | Freq: Every day | ORAL | Status: DC | PRN
  Administered 2023-10-26: 10 mg via ORAL
  Filled 2023-10-26: qty 2

## 2023-10-26 NOTE — Progress Notes (Addendum)
Progress Note  Patient Name: Theresa Crawford Date of Encounter: 10/26/2023  Primary Cardiologist: Marjo Bicker, MD  Subjective   No acute events overnight.  Rates controlled.  No symptoms.  Inpatient Medications    Scheduled Meds:  aspirin EC  81 mg Oral Q breakfast   busPIRone  15 mg Oral TID   Chlorhexidine Gluconate Cloth  6 each Topical Q0600   cyanocobalamin  1,000 mcg Oral Daily   dapagliflozin propanediol  10 mg Oral Daily   diltiazem  90 mg Oral Q8H   ferrous sulfate  325 mg Oral Q breakfast   folic acid  1 mg Oral Daily   influenza vac split trivalent PF  0.5 mL Intramuscular Tomorrow-1000   sertraline  50 mg Oral Daily   traZODone  100 mg Oral QHS   Continuous Infusions:  PRN Meds: acetaminophen **OR** acetaminophen, hydrOXYzine, levalbuterol, ondansetron **OR** ondansetron (ZOFRAN) IV   Vital Signs    Vitals:   10/26/23 0448 10/26/23 0641 10/26/23 0700 10/26/23 0728  BP:   (!) 93/49   Pulse:   66   Resp:   18   Temp: 98 F (36.7 C)   98.2 F (36.8 C)  TempSrc: Axillary   Oral  SpO2:   93%   Weight:  114.1 kg    Height:        Intake/Output Summary (Last 24 hours) at 10/26/2023 1000 Last data filed at 10/25/2023 1811 Gross per 24 hour  Intake 86.57 ml  Output --  Net 86.57 ml   Filed Weights   10/25/23 0238 10/25/23 1710 10/26/23 0641  Weight: 113.2 kg 112.8 kg 114.1 kg    Telemetry     Personally reviewed.  HR 70-80s, rate controlled, rhythm appears to be normal sinus rhythm.  ECG    Pending.  Physical Exam   GEN: No acute distress.   Neck: No JVD. Cardiac: RRR, no murmur, rub, or gallop.  Respiratory: Nonlabored. Clear to auscultation bilaterally. GI: Soft, nontender, bowel sounds present. MS: No edema; No deformity. Neuro:  Nonfocal. Psych: Alert and oriented x 3. Normal affect.  Labs    Chemistry Recent Labs  Lab 10/25/23 0241 10/26/23 0445  NA 131* 135  K 3.6 4.2  CL 100 103  CO2 22 24  GLUCOSE 128* 149*   BUN 15 16  CREATININE 0.81 0.78  CALCIUM 9.1 8.6*  PROT 8.3*  --   ALBUMIN 3.9  --   AST 21  --   ALT 11  --   ALKPHOS 52  --   BILITOT 0.5  --   GFRNONAA >60 >60  ANIONGAP 9 8     Hematology Recent Labs  Lab 10/25/23 0241 10/26/23 0445  WBC 17.3* 9.7  RBC 5.37* 4.75  HGB 13.8 12.1  HCT 43.4 40.2  MCV 80.8 84.6  MCH 25.7* 25.5*  MCHC 31.8 30.1  RDW 19.6* 19.0*  PLT 485* 377    Cardiac Enzymes Recent Labs  Lab 10/10/23 0242 10/10/23 0520 10/25/23 0241 10/25/23 0504  TROPONINIHS 7 6 86* 104*    BNP Recent Labs  Lab 10/25/23 0241  BNP 444.0*     DDimerNo results for input(s): "DDIMER" in the last 168 hours.   Radiology    DG Chest Portable 1 View Result Date: 10/25/2023 CLINICAL DATA:  Tachycardia. EXAM: PORTABLE CHEST 1 VIEW COMPARISON:  October 10, 2023 FINDINGS: Multiple sternal wires are noted. The cardiac silhouette is mildly enlarged and unchanged in size. Artificial cardiac valves are  seen. Mild diffusely increased lung markings are noted. Mild atelectatic changes are suspected within the bilateral lung bases. No pleural effusion or pneumothorax is identified. The visualized skeletal structures are unremarkable. IMPRESSION: 1. Evidence of prior median and cardiac valve replacement. 2. Stable cardiomegaly with mild congestive heart failure and mild bibasilar atelectasis. Electronically Signed   By: Aram Candela M.D.   On: 10/25/2023 03:49     Assessment & Plan   Atrial flutter with RVR, rate controlled (S/p TEE guided DCCV in 2023) Acute on chronic diastolic heart failure, compensated Mechanical mitral valve repair in 2014 followed by replacement in 2020 with residual moderate mitral valve stenosis per echocardiogram in 2024 History of tricuspid valve repair Cocaine abuse, U tox positive Supratherapeutic INR with no evidence of bleeding   -EKG upon arrival showed atrial flutter with RVR that was rate controlled with diltiazem drip, currently  on diltiazem 90 mg TID.  Will switch to long-acting diltiazem 300 mg once daily.  Coumadin on hold due to supratherapeutic INR.  Restart Coumadin once INR is less than 4.  Echocardiogram is pending, we will follow-up on it.  Telemetry reviewed, rates controlled and appears to be in normal sinus rhythm but will need EKG to confirm its NSR and not rate controlled atrial flutter.  Cocaine use and cessation counseling provided.  Addendum: EKG today showed NSR   [facility] will sign off.   Medication Recommendations: Diltiazem 300 mg once daily, restart Coumadin once INR is less than 4. Other recommendations (labs, testing, etc): None Follow up as an outpatient: Outpatient cardiology follow-up in 6 weeks.    Signed, Elaria Osias P Maida Widger, MD  10/26/2023, 10:00 AM

## 2023-10-26 NOTE — Progress Notes (Signed)
PHARMACY - ANTICOAGULATION CONSULT NOTE  Pharmacy Consult for Warfarin Indication: atrial fibrillation/MVR  Allergies  Allergen Reactions   Bee Venom Anaphylaxis   Lisinopril Anaphylaxis, Shortness Of Breath, Swelling and Other (See Comments)    Throat swells   Penicillins Shortness Of Breath, Swelling and Other (See Comments)    Has patient had a PCN reaction causing immediate rash, facial/tongue/throat swelling, SOB or lightheadedness with hypotension: Yes Has patient had a PCN reaction causing severe rash involving mucus membranes or skin necrosis: Yes Has patient had a PCN reaction that required hospitalization No Has patient had a PCN reaction occurring within the last 10 years: No If all of the above answers are "NO", then may proceed with Cephalosporin use.    Perflutren Lipid Microsphere Shortness Of Breath and Other (See Comments)    Chest Pain/Tightness, also   Shellfish Allergy Anaphylaxis   Contrast Media [Iodinated Contrast Media] Hives, Itching and Other (See Comments)    Itching and hives to throat, neck, face and arms.   No difficulty breathing.   This was given at [facility] approximately 2015. Contrast Dye    Patient Measurements: Height: 5\' 4"  (162.6 cm) Weight: 114.1 kg (251 lb 8.7 oz) IBW/kg (Calculated) : 54.7 Heparin Dosing Weight: 85 kg  Vital Signs: Temp: 98.2 F (36.8 C) (02/12 0728) Temp Source: Oral (02/12 0728) BP: 93/49 (02/12 0700) Pulse Rate: 66 (02/12 0700)  Labs: Recent Labs    10/25/23 0241 10/25/23 0504 10/25/23 0942 10/26/23 0445  HGB 13.8  --   --  12.1  HCT 43.4  --   --  40.2  PLT 485*  --   --  377  LABPROT 44.8*  --   --  41.2*  INR 4.7*  --   --  4.3*  HEPARINUNFRC  --   --  <0.10*  --   CREATININE 0.81  --   --  0.78  TROPONINIHS 86* 104*  --   --     Estimated Creatinine Clearance: 112.4 mL/min (by C-G formula based on SCr of 0.78 mg/dL).   Medical History: Past Medical History:  Diagnosis Date    Anxiety    Arthritis    "lower back" (04/04/2018)   Bipolar disorder (HCC)    Chronic bronchitis (HCC)    Chronic diastolic CHF (congestive heart failure) (HCC)    Chronic lower back pain    DDD (degenerative disc disease), lumbar    Depression    Dyspnea    GERD (gastroesophageal reflux disease)    Headache    "weekly" (04/04/2018), miragrains in past (04/03/2019)   Heart murmur    Hypertension    Mitral valve disease    Normocytic anemia    Obesity    Palpitations    Pericarditis    Pneumonia    Premature atrial contractions    Pulmonary hypertension (HCC)    PVC's (premature ventricular contractions)    a. h/o palpitations with event monitor in 03/2017 showing NSR with PACs/PVCs.   Recurrent mitral valve stenosis and regurgitation s/p mitral valve repair    S/P mitral valve repair 03/27/2013   Dr. Darcus Austin - [place], [place] - complex valvuloplasty including resuspension of entire posterior leaflet using Gore-tex neochords and 30 mm Edwards Physio ring annuloplasty   S/P redo mitral valve replacement with mechanical valve 04/05/2019   29 mm Sorin Carbomedics Optiform bileaflet mechanical valve   S/P tricuspid valve repair 04/05/2019   28 mm Edwards mc3 ring annuloplasty   Tobacco abuse  Tricuspid regurgitation     Medications:  No current facility-administered medications on file prior to encounter.   Current Outpatient Medications on File Prior to Encounter  Medication Sig Dispense Refill   acetaminophen (TYLENOL) 325 MG tablet Take 2 tablets (650 mg total) by mouth every 6 (six) hours as needed for mild pain.     albuterol (VENTOLIN HFA) 108 (90 Base) MCG/ACT inhaler Inhale 2 puffs into the lungs every 6 (six) hours as needed for wheezing or shortness of breath. 18 g 2   aspirin EC 81 MG tablet Take 1 tablet (81 mg total) by mouth daily with breakfast. 30 tablet 2   busPIRone (BUSPAR) 15 MG tablet Take 1 tablet (15 mg total) by mouth 3 (three) times daily. 90 tablet  3   carvedilol (COREG) 3.125 MG tablet Take 1 tablet (3.125 mg total) by mouth 2 (two) times daily with a meal. 60 tablet 5   Cholecalciferol (VITAMIN D-3 PO) Take 1 capsule by mouth daily.     cyanocobalamin (VITAMIN B12) 1000 MCG tablet Take 1 tablet (1,000 mcg total) by mouth daily. 30 tablet 3   dapagliflozin propanediol (FARXIGA) 10 MG TABS tablet Take 1 tablet (10 mg total) by mouth daily. 30 tablet 5   ferrous sulfate 325 (65 FE) MG tablet Take 1 tablet (325 mg total) by mouth daily with breakfast. 90 tablet 3   folic acid (FOLVITE) 400 MCG tablet Take 1 tablet (400 mcg total) by mouth daily. 30 tablet 5   furosemide (LASIX) 80 MG tablet Take 1 tablet (80 mg total) by mouth daily. 30 tablet 11   hydrOXYzine (ATARAX) 25 MG tablet Take 1 tablet (25 mg total) by mouth every 6 (six) hours as needed for anxiety. 90 tablet 3   Omega-3 Fatty Acids (OMEGA-3 FISH OIL PO) Take 1 capsule by mouth daily at 6 (six) AM.     potassium chloride SA (KLOR-CON M) 20 MEQ tablet Take 20 mEq by mouth 2 (two) times daily.     sertraline (ZOLOFT) 50 MG tablet Take 1 tablet (50 mg total) by mouth daily. 30 tablet 2   spironolactone (ALDACTONE) 25 MG tablet Take 1 tablet (25 mg total) by mouth in the morning. 30 tablet 3   traZODone (DESYREL) 100 MG tablet Take 1 tablet (100 mg total) by mouth at bedtime. 30 tablet 5   warfarin (COUMADIN) 5 MG tablet Take 2 tablets (10 mg total) by mouth daily at 4 PM. 30 tablet 0     Assessment: 43 y.o. female with chest pain and Afib, MD ordered heparin=> now continuing with warfarin.  Pt on chronic warfarin with supratherapeutic INR 4.3 for MVR on Coumadin.   CBC WNL  Goal of Therapy:  Goal INR 2.5-3.5 Monitor platelets by anticoagulation protocol: Yes   Plan:  No Coumadin today DailyPT-INR Monitor for S/S of bleeding  Judeth Cornfield, PharmD Clinical Pharmacist 10/26/2023 8:21 AM

## 2023-10-26 NOTE — Plan of Care (Signed)

## 2023-10-26 NOTE — TOC Initial Note (Signed)
Transition of Care [facility]) - Initial/Assessment Note    Patient Details  Name: Theresa Crawford MRN: [id number] Date of Birth: 1981/04/10  Transition of Care [facility]) CM/SW Contact:    Leitha Bleak, RN Phone Number: 10/26/2023, 12:55 PM  Clinical Narrative:    Patient now in ICU sleeping. CM following up with nurses. No complaints from patient about home life or boyfriend, he visited last night and cuddle up in the bed. TOC following.                 Expected Discharge Plan: Home/Self Care Barriers to Discharge: Continued Medical Work up   Patient Goals and CMS Choice Patient states their goals for this hospitalization and ongoing recovery are:: to go home. CMS Medicare.gov Compare Post Acute Care list provided to:: Patient      Expected Discharge Plan and Services      Living arrangements for the past 2 months: Single Family Home            Prior Living Arrangements/Services Living arrangements for the past 2 months: Single Family Home Lives with:: Friends Patient language and need for interpreter reviewed:: Yes        Need for Family Participation in Patient Care: Yes (Comment) Care giver support system in place?: Yes (comment)   Criminal Activity/Legal Involvement Pertinent to Current Situation/Hospitalization: No - Comment as needed  Activities of Daily Living   ADL Screening (condition at time of admission) Independently performs ADLs?: No Does the patient have a NEW difficulty with bathing/dressing/toileting/self-feeding that is expected to last >3 days?: No Does the patient have a NEW difficulty with getting in/out of bed, walking, or climbing stairs that is expected to last >3 days?: No Does the patient have a NEW difficulty with communication that is expected to last >3 days?: No Is the patient deaf or have difficulty hearing?: No Does the patient have difficulty seeing, even when wearing glasses/contacts?: No Does the patient have difficulty concentrating,  remembering, or making decisions?: No  Permission Sought/Granted     Emotional Assessment      Orientation: : Oriented to Self, Oriented to Place, Oriented to  Time, Oriented to Situation Alcohol / Substance Use: Not Applicable Psych Involvement: No (comment)  Admission diagnosis:  Acute exacerbation of CHF (congestive heart failure) (HCC) [I50.9] Patient Active Problem List   Diagnosis Date Noted   Cocaine abuse (HCC) 10/25/2023   S/P TVR (tricuspid valve repair) 10/25/2023   S/P MVR (mitral valve replacement) 10/25/2023   Acute on chronic diastolic heart failure (HCC) 10/25/2023   Acute exacerbation of CHF (congestive heart failure) (HCC) 10/10/2023   Elevated d-dimer 10/10/2023   Valvular heart disease 10/10/2023   Obesity, Class III, BMI 40-49.9 (morbid obesity) (HCC) 10/10/2023   Erroneous encounter - disregard 05/23/2023   Acute on chronic systolic CHF (congestive heart failure) (HCC) 04/07/2023   Substance abuse (HCC) 08/19/2022   Atrial flutter with rapid ventricular response (HCC)    Acute HFrEF 01/12/2022   Subtherapeutic international normalized ratio (INR) 01/12/2022   S/P tricuspid valve repair 04/05/2019   Obesity (BMI 30-39.9) 03/29/2019   Sarcoidosis    Acute on chronic diastolic congestive heart failure (HCC) 03/24/2019   Normocytic anemia    Dysuria 03/19/2019   Acute CHF (congestive heart failure) (HCC) 03/04/2019   MDD (major depressive disorder), recurrent episode (HCC) 04/05/2018   S/P tooth extraction 04/03/2018   Suicidal ideation 04/03/2018   MDD (major depressive disorder)    Chronic diastolic congestive heart failure (HCC)  Tricuspid regurgitation    Hypomagnesemia 03/07/2018   Tobacco abuse    Dyspnea 01/11/2018   Prolonged QT interval 09/10/2017   Normochromic normocytic anemia    Pulmonary edema 07/05/2017   Hypokalemia 06/12/2017   Pulmonary hypertension (HCC) 06/12/2017   Flash pulmonary edema (HCC) 06/11/2017   Depression with  anxiety 06/11/2017   Recurrent mitral valve stenosis and regurgitation s/p mitral valve repair    Acute pulmonary edema (HCC) 10/10/2016   CAP (community acquired pneumonia) 05/25/2016   Leukocytosis 05/24/2016   Acute respiratory failure with hypoxia (HCC) 05/23/2016   Acute on chronic diastolic CHF (congestive heart failure) (HCC) 05/23/2016   Cough with hemoptysis 05/23/2016   Postpartum complication pericarditis in 2009 with eventual needing MVR in 2015 05/23/2016   Anemia, iron deficiency 05/23/2016   Hypertension    SOB (shortness of breath)    Respiratory distress 02/16/2016   Essential hypertension 02/16/2016   S/P mitral valve repair 03/27/2013   PCP:  Patient, No Pcp Per Pharmacy:   [id number] - [place], [place] - [address] [place] [address] Phone: [phone] Fax: [phone]     Social Drivers of Health (SDOH) Social History: SDOH Screenings   Food Insecurity: Patient Declined (10/10/2023)  Housing: High Risk (10/10/2023)  Transportation Needs: Patient Declined (10/10/2023)  Utilities: Patient Declined (10/10/2023)  Alcohol Screen: Low Risk  (04/05/2018)  Depression (PHQ2-9): Low Risk  (10/31/2019)  Tobacco Use: High Risk (10/10/2023)   SDOH Interventions:     Readmission Risk Interventions    10/11/2023   10:12 AM 04/07/2023   12:00 PM 01/13/2022    4:12 PM  Readmission Risk Prevention Plan  Transportation Screening  Complete Complete  PCP or Specialist Appt within 5-7 Days   Complete  Home Care Screening   Complete  Medication Review (RN CM)   Complete  HRI or Home Care Consult  Complete   Social Work Consult for Recovery Care Planning/Counseling  Complete   Medication Review Oceanographer)  Complete      Information is confidential and restricted. Go to Review Flowsheets to unlock data.

## 2023-10-26 NOTE — Progress Notes (Signed)
PROGRESS NOTE   Theresa Crawford  ONG:[id number] DOB: 08-Aug-1981 DOA: 10/25/2023 PCP: Patient, No Pcp Per   Chief Complaint  Patient presents with   Shortness of Breath   Chest Pain   Level of care: Telemetry  Brief Admission History:  43 y.o. female with medical history significant for HFpEF, MVR on Coumadin, hypertension, morbid obesity, recurrent cocaine use, and depression/anxiety who presented to the ED with complaints of worsening shortness of breath and palpitations along with headache over the last 2 days.  She is also noted to have a productive cough and some chills.  She was recently discharged from [facility] on 10/12/2023 for acute hypoxemic respiratory failure in the setting of acute CHF exacerbation related to cocaine use.  INR was noted to be subtherapeutic at that time.  She complains of some mild chest pain and denies any orthopnea or dizziness.  She does have some mild lower extremity edema and states that she is compliant with her medications.  She does admit to using cocaine 2 days prior.   ED Course: Vital signs with elevated heart rates noted in a flutter with RVR.  She was started on Cardizem drip with improved rate control noted.  Troponins mildly elevated initially at 86 and then 104.  Sodium 131.  She was given a heparin bolus and then INR returned at 4.7.  Cardiology recommending continuation of IV drip as well as further evaluation with 2D echocardiogram this admission.   Assessment and Plan:  Atrial Flutter with RVR  - rate much better controlled on diltiazem - short - acting diltiazem being converted to long acting by cardiology team - pt remains on warfarin for anticoagulation - medically being managed as pt not a candidate for TEE given cocaine positive drug screen   Acute HFpEF  - she is on farxiga now from home  - appears compensated at this time  S/p mechanical mitral valve replacement 2020  - warfarin for full anticoagulation managed by pharm  D  Supratherapeutic INR - holding additional warfarin until INR<4 per cardiology - recheck PT/INR in AM   Cocaine abuse  - pt counseled again at length to stop using recreational drugs   DVT prophylaxis: warfarin  Code Status: Full  Family Communication:  Disposition: anticipate DC home tomorrow   Consultants:  Cardiology   Procedures:   Antimicrobials:    Subjective: Pt denies having chest pain and palpitations, no SOB.    Objective: Vitals:   10/26/23 0900 10/26/23 1000 10/26/23 1100 10/26/23 1148  BP: 124/65 (!) 111/58 132/64   Pulse: 64 69 72   Resp: 20     Temp:    97.7 F (36.5 C)  TempSrc:    Oral  SpO2: 93% 95% 94%   Weight:      Height:        Intake/Output Summary (Last 24 hours) at 10/26/2023 1326 Last data filed at 10/25/2023 1811 Gross per 24 hour  Intake 86.57 ml  Output --  Net 86.57 ml   Filed Weights   10/25/23 0238 10/25/23 1710 10/26/23 0641  Weight: 113.2 kg 112.8 kg 114.1 kg   Examination:  General exam: Appears calm and comfortable  Respiratory system: Clear to auscultation. Respiratory effort normal. Cardiovascular system: normal S1 & S2 heard. No JVD, murmurs, rubs, gallops or clicks. No pedal edema. Gastrointestinal system: Abdomen is nondistended, soft and nontender. No organomegaly or masses felt. Normal bowel sounds heard. Central nervous system: Alert and oriented. No focal neurological deficits. Extremities: Symmetric  5 x 5 power. Skin: No rashes, lesions or ulcers. Psychiatry: Judgement and insight appear normal. Mood & affect appropriate.   Data Reviewed: I have personally reviewed following labs and imaging studies  CBC: Recent Labs  Lab 10/25/23 0241 10/26/23 0445  WBC 17.3* 9.7  NEUTROABS 13.0*  --   HGB 13.8 12.1  HCT 43.4 40.2  MCV 80.8 84.6  PLT 485* 377    Basic Metabolic Panel: Recent Labs  Lab 10/25/23 0241 10/26/23 0445  NA 131* 135  K 3.6 4.2  CL 100 103  CO2 22 24  GLUCOSE 128* 149*  BUN 15  16  CREATININE 0.81 0.78  CALCIUM 9.1 8.6*  MG  --  2.2    CBG: No results for input(s): "GLUCAP" in the last 168 hours.  Recent Results (from the past 240 hours)  Resp panel by RT-PCR (RSV, Flu A&B, Covid) Anterior Nasal Swab     Status: None   Collection Time: 10/25/23  7:22 AM   Specimen: Anterior Nasal Swab  Result Value Ref Range Status   SARS Coronavirus 2 by RT PCR NEGATIVE NEGATIVE Final    Comment: (NOTE) SARS-CoV-2 target nucleic acids are NOT DETECTED.  The SARS-CoV-2 RNA is generally detectable in upper respiratory specimens during the acute phase of infection. The lowest concentration of SARS-CoV-2 viral copies this assay can detect is 138 copies/mL. A negative result does not preclude SARS-Cov-2 infection and should not be used as the sole basis for treatment or other patient management decisions. A negative result may occur with  improper specimen collection/handling, submission of specimen other than nasopharyngeal swab, presence of viral mutation(s) within the areas targeted by this assay, and inadequate number of viral copies(<138 copies/mL). A negative result must be combined with clinical observations, patient history, and epidemiological information. The expected result is Negative.  Fact Sheet for Patients:  BloggerCourse.com  Fact Sheet for Healthcare Providers:  SeriousBroker.it  This test is no t yet approved or cleared by the [place] FDA and  has been authorized for detection and/or diagnosis of SARS-CoV-2 by FDA under an Emergency Use Authorization (EUA). This EUA will remain  in effect (meaning this test can be used) for the duration of the COVID-19 declaration under Section 564(b)(1) of the Act, 21 U.S.C.section 360bbb-3(b)(1), unless the authorization is terminated  or revoked sooner.       Influenza A by PCR NEGATIVE NEGATIVE Final   Influenza B by PCR NEGATIVE NEGATIVE Final     Comment: (NOTE) The Xpert Xpress SARS-CoV-2/FLU/RSV plus assay is intended as an aid in the diagnosis of influenza from Nasopharyngeal swab specimens and should not be used as a sole basis for treatment. Nasal washings and aspirates are unacceptable for Xpert Xpress SARS-CoV-2/FLU/RSV testing.  Fact Sheet for Patients: BloggerCourse.com  Fact Sheet for Healthcare Providers: SeriousBroker.it  This test is not yet approved or cleared by the [place] FDA and has been authorized for detection and/or diagnosis of SARS-CoV-2 by FDA under an Emergency Use Authorization (EUA). This EUA will remain in effect (meaning this test can be used) for the duration of the COVID-19 declaration under Section 564(b)(1) of the Act, 21 U.S.C. section 360bbb-3(b)(1), unless the authorization is terminated or revoked.     Resp Syncytial Virus by PCR NEGATIVE NEGATIVE Final    Comment: (NOTE) Fact Sheet for Patients: BloggerCourse.com  Fact Sheet for Healthcare Providers: SeriousBroker.it  This test is not yet approved or cleared by the [place] and has been authorized for  detection and/or diagnosis of SARS-CoV-2 by FDA under an Emergency Use Authorization (EUA). This EUA will remain in effect (meaning this test can be used) for the duration of the COVID-19 declaration under Section 564(b)(1) of the Act, 21 U.S.C. section 360bbb-3(b)(1), unless the authorization is terminated or revoked.  Performed at [facility], [address], [place], [place] [address]   MRSA Next Gen by PCR, Nasal     Status: None   Collection Time: 10/25/23  5:00 PM   Specimen: Nasal Mucosa; Nasal Swab  Result Value Ref Range Status   MRSA by PCR Next Gen NOT DETECTED NOT DETECTED Final    Comment: (NOTE) The GeneXpert MRSA Assay (FDA approved for NASAL specimens only), is one component of a comprehensive MRSA  colonization surveillance program. It is not intended to diagnose MRSA infection nor to guide or monitor treatment for MRSA infections. Test performance is not FDA approved in patients less than 22 years old. Performed at [facility], [address], [place], [place] [address]      Radiology Studies: DG Chest Portable 1 View Result Date: 10/25/2023 CLINICAL DATA:  Tachycardia. EXAM: PORTABLE CHEST 1 VIEW COMPARISON:  October 10, 2023 FINDINGS: Multiple sternal wires are noted. The cardiac silhouette is mildly enlarged and unchanged in size. Artificial cardiac valves are seen. Mild diffusely increased lung markings are noted. Mild atelectatic changes are suspected within the bilateral lung bases. No pleural effusion or pneumothorax is identified. The visualized skeletal structures are unremarkable. IMPRESSION: 1. Evidence of prior median and cardiac valve replacement. 2. Stable cardiomegaly with mild congestive heart failure and mild bibasilar atelectasis. Electronically Signed   By: Aram Candela M.D.   On: 10/25/2023 03:49    Scheduled Meds:  aspirin EC  81 mg Oral Q breakfast   busPIRone  15 mg Oral TID   Chlorhexidine Gluconate Cloth  6 each Topical Q0600   cyanocobalamin  1,000 mcg Oral Daily   dapagliflozin propanediol  10 mg Oral Daily   diltiazem  300 mg Oral Daily   ferrous sulfate  325 mg Oral Q breakfast   folic acid  1 mg Oral Daily   influenza vac split trivalent PF  0.5 mL Intramuscular Tomorrow-1000   sertraline  50 mg Oral Daily   traZODone  100 mg Oral QHS   Continuous Infusions:   LOS: 1 day   Time spent: 56 mins  Raiven Belizaire Laural Benes, MD How to contact the [facility] Attending or Consulting provider 7A - 7P or covering provider during after hours 7P -7A, for this patient?  Check the care team in [facility] and look for a) attending/consulting TRH provider listed and b) the [facility] team listed Log into [email or web address] to find provider on call.  Locate the [facility] provider you are looking  for under [facility] and page to a number that you can be directly reached. If you still have difficulty reaching the provider, please page the [facility] (Director on Call) for the Hospitalists listed on amion for assistance.  10/26/2023, 1:26 PM

## 2023-10-26 NOTE — Hospital Course (Signed)
44 y.o. female with medical history significant for HFpEF, MVR on Coumadin, hypertension, morbid obesity, recurrent cocaine use, and depression/anxiety who presented to the ED with complaints of worsening shortness of breath and palpitations along with headache over the last 2 days.  She is also noted to have a productive cough and some chills.  She was recently discharged from [facility] on 10/12/2023 for acute hypoxemic respiratory failure in the setting of acute CHF exacerbation related to cocaine use.  INR was noted to be subtherapeutic at that time.  She complains of some mild chest pain and denies any orthopnea or dizziness.  She does have some mild lower extremity edema and states that she is compliant with her medications.  She does admit to using cocaine 2 days prior.   ED Course: Vital signs with elevated heart rates noted in a flutter with RVR.  She was started on Cardizem drip with improved rate control noted.  Troponins mildly elevated initially at 86 and then 104.  Sodium 131.  She was given a heparin bolus and then INR returned at 4.7.  Cardiology recommending continuation of IV drip as well as further evaluation with 2D echocardiogram this admission.

## 2023-10-27 ENCOUNTER — Encounter: Payer: Self-pay | Admitting: Internal Medicine

## 2023-10-27 DIAGNOSIS — I4892 Unspecified atrial flutter: Secondary | ICD-10-CM | POA: Diagnosis not present

## 2023-10-27 DIAGNOSIS — F1910 Other psychoactive substance abuse, uncomplicated: Secondary | ICD-10-CM

## 2023-10-27 DIAGNOSIS — Z952 Presence of prosthetic heart valve: Secondary | ICD-10-CM | POA: Diagnosis not present

## 2023-10-27 DIAGNOSIS — I5033 Acute on chronic diastolic (congestive) heart failure: Secondary | ICD-10-CM | POA: Diagnosis not present

## 2023-10-27 DIAGNOSIS — I10 Essential (primary) hypertension: Secondary | ICD-10-CM

## 2023-10-27 LAB — PROTIME-INR
INR: 2.7 — ABNORMAL HIGH (ref 0.8–1.2)
Prothrombin Time: 29.3 s — ABNORMAL HIGH (ref 11.4–15.2)

## 2023-10-27 MED ORDER — DILTIAZEM HCL ER COATED BEADS 300 MG PO CP24: 300.0000 mg | ORAL_CAPSULE | Freq: Every day | ORAL | 1 refills | Status: AC

## 2023-10-27 MED ORDER — DAPAGLIFLOZIN PROPANEDIOL 10 MG PO TABS: 10.0000 mg | ORAL_TABLET | Freq: Every day | ORAL | 2 refills | Status: AC

## 2023-10-27 MED ORDER — HYDROXYZINE HCL 25 MG PO TABS: 25.0000 mg | ORAL_TABLET | Freq: Four times a day (QID) | ORAL | 0 refills | Status: AC | PRN

## 2023-10-27 MED ORDER — WARFARIN SODIUM 7.5 MG PO TABS: 7.5000 mg | ORAL_TABLET | Freq: Every day | ORAL | 0 refills | Status: AC

## 2023-10-27 NOTE — Progress Notes (Signed)
PHARMACY - ANTICOAGULATION CONSULT NOTE  Pharmacy Consult for Warfarin Indication: atrial fibrillation/MVR  Allergies  Allergen Reactions   Bee Venom Anaphylaxis   Lisinopril Anaphylaxis, Shortness Of Breath, Swelling and Other (See Comments)    Throat swells   Penicillins Shortness Of Breath, Swelling and Other (See Comments)    immediate rash, facial/tongue/throat swelling, SOB or lightheadedness with hypotension  severe rash involving mucus membranes or skin necrosis   Perflutren Lipid Microsphere Shortness Of Breath and Other (See Comments)    Chest Pain/Tightness, also   Shellfish Allergy Anaphylaxis   Contrast Media [Iodinated Contrast Media] Hives, Itching and Other (See Comments)    Itching and hives to throat, neck, face and arms.   No difficulty breathing.   This was given at [facility] approximately 2015. Contrast Dye    Patient Measurements: Height: 5\' 4"  (162.6 cm) Weight: 114.1 kg (251 lb 8.7 oz) IBW/kg (Calculated) : 54.7 Heparin Dosing Weight: 85 kg  Vital Signs: Temp: 98.2 F (36.8 C) (02/13 0745) Temp Source: Oral (02/13 0745) BP: 110/53 (02/13 0805) Pulse Rate: 78 (02/13 0805)  Labs: Recent Labs    10/25/23 0241 10/25/23 0504 10/25/23 0942 10/26/23 0445 10/27/23 0511  HGB 13.8  --   --  12.1  --   HCT 43.4  --   --  40.2  --   PLT 485*  --   --  377  --   LABPROT 44.8*  --   --  41.2* 29.3*  INR 4.7*  --   --  4.3* 2.7*  HEPARINUNFRC  --   --  <0.10*  --   --   CREATININE 0.81  --   --  0.78  --   TROPONINIHS 86* 104*  --   --   --     Estimated Creatinine Clearance: 112.4 mL/min (by C-G formula based on SCr of 0.78 mg/dL).  Assessment: 43 y.o. female with chest pain and Afib, MD ordered heparin=> now continuing with warfarin.  Pt on chronic warfarin now with therapeutic INR 2.7 for MVR on Coumadin.   CBC WNL  Goal of Therapy:  Goal INR 2.5-3.5 Monitor platelets by anticoagulation protocol: Yes   Plan:  Reduce home  dose of warfarin to 7.5mg  daily INR in 3-5 days   Sheppard Coil PharmD., BCPS Clinical Pharmacist 10/27/2023 10:52 AM

## 2023-10-27 NOTE — Progress Notes (Signed)
10/27/2023 1:47 PM  Pt calling back hospital about lasix prescription.  She received 11 refills thru 03/2024 from a previous provider.  I called her pharmacy and spoke with pharmacist, they told me she has 180 tabs left that she could come and pick up at anytime. I told the RN to let her know that so she can pick up her refill as needed.  Maryln Manuel MD

## 2023-10-27 NOTE — Discharge Summary (Signed)
Physician Discharge Summary  Theresa Crawford NFA:[id number] DOB: [birth year] DOA: 10/25/2023  Admit date: 10/25/2023 Discharge date: 10/27/2023  Admitted From:  Home  Disposition:  Home    Recommendations for Outpatient Follow-up:  Establish care with PCP in 1 weeks PT/INR testing in 3-5 days  Follow up with [facility] in 6 weeks   Discharge Condition: STABLE   CODE STATUS: FULL DIET: heart healthy recommended    Brief Hospitalization Summary: Please see all hospital notes, images, labs for full details of the hospitalization. Admission provider HPI:    43 y.o. female with medical history significant for HFpEF, MVR on Coumadin, hypertension, morbid obesity, recurrent cocaine use, and depression/anxiety who presented to the ED with complaints of worsening shortness of breath and palpitations along with headache over the last 2 days.  She is also noted to have a productive cough and some chills.  She was recently discharged from [facility] on 10/12/2023 for acute hypoxemic respiratory failure in the setting of acute CHF exacerbation related to cocaine use.  INR was noted to be subtherapeutic at that time.  She complains of some mild chest pain and denies any orthopnea or dizziness.  She does have some mild lower extremity edema and states that she is compliant with her medications.  She does admit to using cocaine 2 days prior.   ED Course: Vital signs with elevated heart rates noted in a flutter with RVR.  She was started on Cardizem drip with improved rate control noted.  Troponins mildly elevated initially at 86 and then 104.  Sodium 131.  She was given a heparin bolus and then INR returned at 4.7.  Cardiology recommending continuation of IV drip as well as further evaluation with 2D echocardiogram this admission.   Hospital Course by problem list   Atrial Flutter with RVR - treated and controlled  - rate much better controlled on diltiazem - short - acting diltiazem being converted to long  acting by cardiology team - pt remains on warfarin for anticoagulation - medically being managed as pt not a candidate for TEE given cocaine positive drug screen  - DC  home on oral diltiazem CD 300 mg daily   - pt was again counseled at length regarding having her PT/INR tested regularly and to avoid long periods of not having it tested as she has done in the past, counseled on risks of taking warfarin without having it monitored and she verbalized understanding  - pt strongly advised to follow up with cardiology outpatient as she has not been doing this   Acute HFpEF  - she is on farxiga now from home  - appears compensated at this time   S/p mechanical mitral valve replacement 2020  - warfarin for full anticoagulation managed by pharm D   Supratherapeutic INR - holding additional warfarin until INR<4 per cardiology - INR down to 2.4 today, resume daily warfarin - strongly advised patient to have PT retested in 3-5 days, pt agreeable to have it done, I told her to go to urgent care or ED to have it done if she was unable to get to the anticoagulation clinic or PCP, she verbalized understanding  - warfarin dose reduced from 10 mg daily to 7.5 mg daily --- strongly advised patient close outpatient follow up and frequent PT/INR testing   Cocaine abuse  - pt counseled again at length to stop using recreational drugs   Discharge Diagnoses:  Principal Problem:   Acute on chronic diastolic CHF (congestive heart  failure) (HCC) Active Problems:   Essential hypertension   Leukocytosis   S/P mitral valve repair   Depression with anxiety   Tricuspid regurgitation   MDD (major depressive disorder)   Atrial flutter with rapid ventricular response (HCC)   Substance abuse (HCC)   Cocaine abuse (HCC)   S/P TVR (tricuspid valve repair)   S/P MVR (mitral valve replacement)   Acute on chronic diastolic heart failure [facility])  Discharge Instructions: Discharge Instructions     Ambulatory  referral to Cardiology   Complete by: As directed       Allergies as of 10/27/2023       Reactions   Bee Venom Anaphylaxis   Lisinopril Anaphylaxis, Shortness Of Breath, Swelling, Other (See Comments)   Throat swells   Penicillins Shortness Of Breath, Swelling, Other (See Comments)   immediate rash, facial/tongue/throat swelling, SOB or lightheadedness with hypotension  severe rash involving mucus membranes or skin necrosis   Perflutren Lipid Microsphere Shortness Of Breath, Other (See Comments)   Chest Pain/Tightness, also   Shellfish Allergy Anaphylaxis   Contrast Media [iodinated Contrast Media] Hives, Itching, Other (See Comments)   Itching and hives to throat, neck, face and arms.   No difficulty breathing.   This was given at [facility] approximately 2015. Contrast Dye        Medication List     STOP taking these medications    carvedilol 3.125 MG tablet Commonly known as: COREG   spironolactone 25 MG tablet Commonly known as: ALDACTONE       TAKE these medications    acetaminophen 325 MG tablet Commonly known as: TYLENOL Take 2 tablets (650 mg total) by mouth every 6 (six) hours as needed for mild pain.   albuterol 108 (90 Base) MCG/ACT inhaler Commonly known as: VENTOLIN HFA Inhale 2 puffs into the lungs every 6 (six) hours as needed for wheezing or shortness of breath.   aspirin EC 81 MG tablet Take 1 tablet (81 mg total) by mouth daily with breakfast.   busPIRone 15 MG tablet Commonly known as: BUSPAR Take 1 tablet (15 mg total) by mouth 3 (three) times daily.   cyanocobalamin 1000 MCG tablet Commonly known as: VITAMIN B12 Take 1 tablet (1,000 mcg total) by mouth daily.   dapagliflozin propanediol 10 MG Tabs tablet Commonly known as: FARXIGA Take 1 tablet (10 mg total) by mouth daily.   diltiazem 300 MG 24 hr capsule Commonly known as: CARDIZEM CD Take 1 capsule (300 mg total) by mouth daily. Start taking on: October 28, 2023   ferrous sulfate 325 (65 FE) MG tablet Take 1 tablet (325 mg total) by mouth daily with breakfast.   folic acid 400 MCG tablet Commonly known as: FOLVITE Take 1 tablet (400 mcg total) by mouth daily.   furosemide 80 MG tablet Commonly known as: Lasix Take 1 tablet (80 mg total) by mouth daily.   hydrOXYzine 25 MG tablet Commonly known as: ATARAX Take 1 tablet (25 mg total) by mouth every 6 (six) hours as needed for anxiety.   OMEGA-3 FISH OIL PO Take 1 capsule by mouth daily at 6 (six) AM.   potassium chloride SA 20 MEQ tablet Commonly known as: KLOR-CON M Take 20 mEq by mouth 2 (two) times daily.   sertraline 50 MG tablet Commonly known as: Zoloft Take 1 tablet (50 mg total) by mouth daily.   traZODone 100 MG tablet Commonly known as: DESYREL Take 1 tablet (100 mg total) by mouth at  bedtime.   VITAMIN D-3 PO Take 1 capsule by mouth daily.   warfarin 7.5 MG tablet Commonly known as: COUMADIN Take 1 tablet (7.5 mg total) by mouth daily at 4 PM. What changed:  medication strength how much to take        Follow-up Information     primary care provider. Schedule an appointment as soon as possible for a visit in 1 week(s).   Why: Hospital Follow Up        [facility]. Schedule an appointment as soon as possible for a visit in 1 week(s).   Specialty: Cardiology Why: Hospital Follow Up Contact information: [address] [place] [address] [phone]        [facility]. Schedule an appointment as soon as possible for a visit in 3 day(s).   Why: Hospital Follow Up               Allergies  Allergen Reactions   Bee Venom Anaphylaxis   Lisinopril Anaphylaxis, Shortness Of Breath, Swelling and Other (See Comments)    Throat swells   Penicillins Shortness Of Breath, Swelling and Other (See Comments)    immediate rash, facial/tongue/throat swelling, SOB or lightheadedness with  hypotension  severe rash involving mucus membranes or skin necrosis   Perflutren Lipid Microsphere Shortness Of Breath and Other (See Comments)    Chest Pain/Tightness, also   Shellfish Allergy Anaphylaxis   Contrast Media [Iodinated Contrast Media] Hives, Itching and Other (See Comments)    Itching and hives to throat, neck, face and arms.   No difficulty breathing.   This was given at [facility] approximately 2015. Contrast Dye   Allergies as of 10/27/2023       Reactions   Bee Venom Anaphylaxis   Lisinopril Anaphylaxis, Shortness Of Breath, Swelling, Other (See Comments)   Throat swells   Penicillins Shortness Of Breath, Swelling, Other (See Comments)   immediate rash, facial/tongue/throat swelling, SOB or lightheadedness with hypotension  severe rash involving mucus membranes or skin necrosis   Perflutren Lipid Microsphere Shortness Of Breath, Other (See Comments)   Chest Pain/Tightness, also   Shellfish Allergy Anaphylaxis   Contrast Media [iodinated Contrast Media] Hives, Itching, Other (See Comments)   Itching and hives to throat, neck, face and arms.   No difficulty breathing.   This was given at [facility] approximately 2015. Contrast Dye        Medication List     STOP taking these medications    carvedilol 3.125 MG tablet Commonly known as: COREG   spironolactone 25 MG tablet Commonly known as: ALDACTONE       TAKE these medications    acetaminophen 325 MG tablet Commonly known as: TYLENOL Take 2 tablets (650 mg total) by mouth every 6 (six) hours as needed for mild pain.   albuterol 108 (90 Base) MCG/ACT inhaler Commonly known as: VENTOLIN HFA Inhale 2 puffs into the lungs every 6 (six) hours as needed for wheezing or shortness of breath.   aspirin EC 81 MG tablet Take 1 tablet (81 mg total) by mouth daily with breakfast.   busPIRone 15 MG tablet Commonly known as: BUSPAR Take 1 tablet (15 mg total) by mouth 3 (three)  times daily.   cyanocobalamin 1000 MCG tablet Commonly known as: VITAMIN B12 Take 1 tablet (1,000 mcg total) by mouth daily.   dapagliflozin propanediol 10 MG Tabs tablet Commonly known as: FARXIGA Take 1 tablet (10  mg total) by mouth daily.   diltiazem 300 MG 24 hr capsule Commonly known as: CARDIZEM CD Take 1 capsule (300 mg total) by mouth daily. Start taking on: October 28, 2023   ferrous sulfate 325 (65 FE) MG tablet Take 1 tablet (325 mg total) by mouth daily with breakfast.   folic acid 400 MCG tablet Commonly known as: FOLVITE Take 1 tablet (400 mcg total) by mouth daily.   furosemide 80 MG tablet Commonly known as: Lasix Take 1 tablet (80 mg total) by mouth daily.   hydrOXYzine 25 MG tablet Commonly known as: ATARAX Take 1 tablet (25 mg total) by mouth every 6 (six) hours as needed for anxiety.   OMEGA-3 FISH OIL PO Take 1 capsule by mouth daily at 6 (six) AM.   potassium chloride SA 20 MEQ tablet Commonly known as: KLOR-CON M Take 20 mEq by mouth 2 (two) times daily.   sertraline 50 MG tablet Commonly known as: Zoloft Take 1 tablet (50 mg total) by mouth daily.   traZODone 100 MG tablet Commonly known as: DESYREL Take 1 tablet (100 mg total) by mouth at bedtime.   VITAMIN D-3 PO Take 1 capsule by mouth daily.   warfarin 7.5 MG tablet Commonly known as: COUMADIN Take 1 tablet (7.5 mg total) by mouth daily at 4 PM. What changed:  medication strength how much to take        Procedures/Studies: ECHOCARDIOGRAM COMPLETE Result Date: 10/26/2023    ECHOCARDIOGRAM REPORT   Patient Name:   KINJAL NEITZKE Date of Exam: 10/26/2023 Medical Rec #:  [id number]       Height:       64.0 in Accession #:    [id number]      Weight:       251.5 lb Date of Birth:  June 15, 1981       BSA:          2.157 m Patient Age:    42 years        BP:           106/50 mmHg Patient Gender: F               HR:           73 bpm. [name] Procedure: 2D Echo, Cardiac  Doppler, Color Doppler and Intracardiac            Opacification Agent (Both Spectral and Color Flow Doppler were            utilized during procedure). Indications:    CHF-Acute Diastolic I50.31  History:        Patient has prior history of Echocardiogram examinations, most                 recent 04/07/2023. Signs/Symptoms:Shortness of Breath; Risk                 Factors:Hypertension. S/P TVR                 S/P MVR.                  Mitral Valve: 26 mm [employer] Optiform mechanical valve                 valve is present in the mitral position.  Sonographer:    Webb Laws Referring Phys: [id number] PRATIK D SHAH IMPRESSIONS  1. Left ventricular ejection fraction, by estimation, is 60 to 65%. The left ventricle has normal  function. The left ventricle has no regional wall motion abnormalities. Left ventricular diastolic parameters are indeterminate.  2. Right ventricular systolic function is mildly reduced. The right ventricular size is normal. Tricuspid regurgitation signal is inadequate for assessing PA pressure.  3. The mitral valve has been repaired/replaced. Trivial mitral valve regurgitation. The mean mitral valve gradient is 5.0 mmHg. There is a 26 mm [employer] Optiform mechanical valve present in the mitral position.  4. Edwards MC3 annuloplasty (28 mm) in position. The tricuspid valve is has been repaired/replaced.  5. The aortic valve is tricuspid. Aortic valve regurgitation is not visualized.  6. The inferior vena cava is normal in size with greater than 50% respiratory variability, suggesting right atrial pressure of 3 mmHg. Comparison(s): Prior images reviewed side by side. LVEF 60-65% range. Mild RV dysfunction. Mechanical MVR with normal function, mean gradient 5 mmHg. Stable tricuspid annuloplasty with trivial tricuspid regurgitation. FINDINGS  Left Ventricle: Left ventricular ejection fraction, by estimation, is 60 to 65%. The left ventricle has normal function. The left ventricle  has no regional wall motion abnormalities. Definity contrast agent was given IV to delineate the left ventricular  endocardial borders. Strain imaging was not performed. The left ventricular internal cavity size was normal in size. There is no left ventricular hypertrophy. Left ventricular diastolic function could not be evaluated due to mitral valve replacement. Left ventricular diastolic parameters are indeterminate. Right Ventricle: The right ventricular size is normal. No increase in right ventricular wall thickness. Right ventricular systolic function is mildly reduced. Tricuspid regurgitation signal is inadequate for assessing PA pressure. Left Atrium: Left atrial size was normal in size. Right Atrium: Right atrial size was normal in size. Pericardium: There is no evidence of pericardial effusion. Mitral Valve: The mitral valve has been repaired/replaced. Trivial mitral valve regurgitation. There is a 26 mm [employer] Optiform mechanical valve present in the mitral position. MV peak gradient, 12.5 mmHg. The mean mitral valve gradient is 5.0  mmHg. Tricuspid Valve: Edwards MC3 annuloplasty (28 mm) in position. The tricuspid valve is has been repaired/replaced. Tricuspid valve regurgitation is trivial. Aortic Valve: The aortic valve is tricuspid. Aortic valve regurgitation is not visualized. Pulmonic Valve: The pulmonic valve was grossly normal. Pulmonic valve regurgitation is trivial. Aorta: The aortic root and ascending aorta are structurally normal, with no evidence of dilitation. Venous: The inferior vena cava is normal in size with greater than 50% respiratory variability, suggesting right atrial pressure of 3 mmHg. IAS/Shunts: No atrial level shunt detected by color flow Doppler. Additional Comments: 3D imaging was not performed.  LEFT VENTRICLE PLAX 2D LVIDd:         5.00 cm      Diastology LVIDs:         3.50 cm      LV e' medial:    6.31 cm/s LV PW:         1.00 cm      LV E/e' medial:  28.4 LV  IVS:        1.00 cm      LV e' lateral:   8.49 cm/s LVOT diam:     1.90 cm      LV E/e' lateral: 21.1 LV SV:         69 LV SV Index:   32 LVOT Area:     2.84 cm  LV Volumes (MOD) LV vol d, MOD A2C: 129.0 ml LV vol d, MOD A4C: 127.0 ml LV vol s, MOD A2C: 74.0 ml LV vol  s, MOD A4C: 72.0 ml LV SV MOD A2C:     55.0 ml LV SV MOD A4C:     127.0 ml LV SV MOD BP:      55.0 ml RIGHT VENTRICLE            IVC RV Basal diam:  4.00 cm    IVC diam: 1.90 cm RV S prime:     8.05 cm/s TAPSE (M-mode): 2.0 cm LEFT ATRIUM           Index        RIGHT ATRIUM           Index LA diam:      4.50 cm 2.09 cm/m   RA Area:     13.30 cm LA Vol (A4C): 37.8 ml 17.53 ml/m  RA Volume:   36.90 ml  17.11 ml/m  AORTIC VALVE LVOT Vmax:   114.00 cm/s LVOT Vmean:  78.600 cm/s LVOT VTI:    0.243 m  AORTA Ao Root diam: 2.50 cm Ao Asc diam:  2.60 cm MITRAL VALVE MV Area (PHT): 3.85 cm     SHUNTS MV Area VTI:   1.62 cm     Systemic VTI:  0.24 m MV Peak grad:  12.5 mmHg    Systemic Diam: 1.90 cm MV Mean grad:  5.0 mmHg MV Vmax:       1.77 m/s MV Vmean:      98.7 cm/s MV Decel Time: 197 msec MV E velocity: 179.00 cm/s Nona Dell MD Electronically signed by Nona Dell MD Signature Date/Time: 10/26/2023/3:18:05 PM    Final    DG Chest Portable 1 View Result Date: 10/25/2023 CLINICAL DATA:  Tachycardia. EXAM: PORTABLE CHEST 1 VIEW COMPARISON:  October 10, 2023 FINDINGS: Multiple sternal wires are noted. The cardiac silhouette is mildly enlarged and unchanged in size. Artificial cardiac valves are seen. Mild diffusely increased lung markings are noted. Mild atelectatic changes are suspected within the bilateral lung bases. No pleural effusion or pneumothorax is identified. The visualized skeletal structures are unremarkable. IMPRESSION: 1. Evidence of prior median and cardiac valve replacement. 2. Stable cardiomegaly with mild congestive heart failure and mild bibasilar atelectasis. Electronically Signed   By: Aram Candela M.D.   On:  10/25/2023 03:49   CT Angio Chest Pulmonary Embolism (PE) W or WO Contrast Result Date: 10/10/2023 CLINICAL DATA:  Chest pain and shortness of breath. Concern for pulmonary embolism. EXAM: CT ANGIOGRAPHY CHEST WITH CONTRAST TECHNIQUE: Multidetector CT imaging of the chest was performed using the standard protocol during bolus administration of intravenous contrast. Multiplanar CT image reconstructions and MIPs were obtained to evaluate the vascular anatomy. RADIATION DOSE REDUCTION: This exam was performed according to the departmental dose-optimization program which includes automated exposure control, adjustment of the mA and/or kV according to patient size and/or use of iterative reconstruction technique. CONTRAST:  75mL OMNIPAQUE IOHEXOL 350 MG/ML SOLN COMPARISON:  Chest radiograph dated 10/10/2023. chest CT dated 08/10/2020. FINDINGS: Evaluation of this exam is limited due to respiratory motion. Cardiovascular: There is mild cardiomegaly. No pericardial effusion. Tricuspid and mitral valve repair. Mild atherosclerotic calcification of the thoracic aorta. No aneurysmal dilatation or dissection. Evaluation of the pulmonary arteries is limited due to respiratory motion and suboptimal visualization of the peripheral branches. No pulmonary artery embolus identified. Mediastinum/Nodes: Bilateral hilar and mediastinal adenopathy. Right hilar lymph nodes measure 2.2 cm short axis. Subcarinal lymph node measures 2.5 cm. Prevascular space lymph nodes measure 10 mm in short axis. The esophagus is grossly unremarkable.  No mediastinal fluid collection. Lungs/Pleura: Diffuse bilateral interstitial and airspace opacities most consistent with edema. Superimposed pneumonia is not excluded. Clinical correlation recommended. No pleural effusion or pneumothorax. The central airways are patent. Upper Abdomen: No acute abnormality. Musculoskeletal: Median sternotomy wires. No acute osseous pathology. Review of the MIP images  confirms the above findings. IMPRESSION: 1. No CT evidence of pulmonary artery embolus. 2. Mild cardiomegaly with findings of CHF. Superimposed pneumonia is not excluded. 3. Bilateral hilar and mediastinal adenopathy, likely reactive. 4.  Aortic Atherosclerosis (ICD10-I70.0). Electronically Signed   By: [name] M.D.   On: 10/10/2023 10:21   DG Chest Port 1 View Result Date: 10/10/2023 CLINICAL DATA:  Shortness of breath EXAM: PORTABLE CHEST 1 VIEW COMPARISON:  04/07/2023 FINDINGS: Cardiac shadow is enlarged. Postsurgical changes are again noted. Vascular congestion is seen similar to that noted on the prior exam. No significant edema is noted. No bony abnormality is seen. IMPRESSION: Vascular congestion consistent with CHF Electronically Signed   By: Alcide Clever M.D.   On: 10/10/2023 03:22     Subjective: Pt says she is feeling better, no chest pain, no SOB, no palpitations, pt says she will establish with a PCP and will follow [facility] with [facility] for PT/INR testing.   Discharge Exam: Vitals:   10/27/23 0745 10/27/23 0805  BP:  (!) 110/53  Pulse: 81 78  Resp: 16 16  Temp: 98.2 F (36.8 C)   SpO2: 95% 95%   Vitals:   10/27/23 0500 10/27/23 0600 10/27/23 0745 10/27/23 0805  BP: 139/75 (!) 131/51  (!) 110/53  Pulse: 79 82 81 78  Resp: 20 (!) 24 16 16   Temp:   98.2 F (36.8 C)   TempSrc:   Oral   SpO2: 96% 96% 95% 95%  Weight:      Height:       General: Pt is alert, awake, not in acute distress Cardiovascular: irregularly irregular, S1/S2 +, no rubs, no gallops Respiratory: CTA bilaterally, no wheezing, no rhonchi Abdominal: Soft, NT, ND, bowel sounds + Extremities: no edema, no cyanosis   The results of significant diagnostics from this hospitalization (including imaging, microbiology, ancillary and laboratory) are listed below for reference.     Microbiology: Recent Results (from the past 240 hours)  Resp panel by RT-PCR (RSV, Flu A&B, Covid) Anterior Nasal Swab      Status: None   Collection Time: 10/25/23  7:22 AM   Specimen: Anterior Nasal Swab  Result Value Ref Range Status   SARS Coronavirus 2 by RT PCR NEGATIVE NEGATIVE Final    Comment: (NOTE) SARS-CoV-2 target nucleic acids are NOT DETECTED.  The SARS-CoV-2 RNA is generally detectable in upper respiratory specimens during the acute phase of infection. The lowest concentration of SARS-CoV-2 viral copies this assay can detect is 138 copies/mL. A negative result does not preclude SARS-Cov-2 infection and should not be used as the sole basis for treatment or other patient management decisions. A negative result may occur with  improper specimen collection/handling, submission of specimen other than nasopharyngeal swab, presence of viral mutation(s) within the areas targeted by this assay, and inadequate number of viral copies(<138 copies/mL). A negative result must be combined with clinical observations, patient history, and epidemiological information. The expected result is Negative.  Fact Sheet for Patients:  BloggerCourse.com  Fact Sheet for Healthcare Providers:  SeriousBroker.it  This test is no t yet approved or cleared by the [place] FDA and  has been authorized for detection and/or  diagnosis of SARS-CoV-2 by FDA under an Emergency Use Authorization (EUA). This EUA will remain  in effect (meaning this test can be used) for the duration of the COVID-19 declaration under Section 564(b)(1) of the Act, 21 U.S.C.section 360bbb-3(b)(1), unless the authorization is terminated  or revoked sooner.       Influenza A by PCR NEGATIVE NEGATIVE Final   Influenza B by PCR NEGATIVE NEGATIVE Final    Comment: (NOTE) The Xpert Xpress SARS-CoV-2/FLU/RSV plus assay is intended as an aid in the diagnosis of influenza from Nasopharyngeal swab specimens and should not be used as a sole basis for treatment. Nasal washings and aspirates are  unacceptable for Xpert Xpress SARS-CoV-2/FLU/RSV testing.  Fact Sheet for Patients: BloggerCourse.com  Fact Sheet for Healthcare Providers: SeriousBroker.it  This test is not yet approved or cleared by the [place] FDA and has been authorized for detection and/or diagnosis of SARS-CoV-2 by FDA under an Emergency Use Authorization (EUA). This EUA will remain in effect (meaning this test can be used) for the duration of the COVID-19 declaration under Section 564(b)(1) of the Act, 21 U.S.C. section 360bbb-3(b)(1), unless the authorization is terminated or revoked.     Resp Syncytial Virus by PCR NEGATIVE NEGATIVE Final    Comment: (NOTE) Fact Sheet for Patients: BloggerCourse.com  Fact Sheet for Healthcare Providers: SeriousBroker.it  This test is not yet approved or cleared by the [place] FDA and has been authorized for detection and/or diagnosis of SARS-CoV-2 by FDA under an Emergency Use Authorization (EUA). This EUA will remain in effect (meaning this test can be used) for the duration of the COVID-19 declaration under Section 564(b)(1) of the Act, 21 U.S.C. section 360bbb-3(b)(1), unless the authorization is terminated or revoked.  Performed at [facility], [address], [place], [place] [address]   MRSA Next Gen by PCR, Nasal     Status: None   Collection Time: 10/25/23  5:00 PM   Specimen: Nasal Mucosa; Nasal Swab  Result Value Ref Range Status   MRSA by PCR Next Gen NOT DETECTED NOT DETECTED Final    Comment: (NOTE) The GeneXpert MRSA Assay (FDA approved for NASAL specimens only), is one component of a comprehensive MRSA colonization surveillance program. It is not intended to diagnose MRSA infection nor to guide or monitor treatment for MRSA infections. Test performance is not FDA approved in patients less than 2 years old. Performed at [facility], [address] [place], [place] [address]      Labs: BNP (last 3 results) Recent Labs    04/06/23 2350 10/10/23 0242 10/25/23 0241  BNP 285.0* 190.0* 444.0*   Basic Metabolic Panel: Recent Labs  Lab 10/25/23 0241 10/26/23 0445  NA 131* 135  K 3.6 4.2  CL 100 103  CO2 22 24  GLUCOSE 128* 149*  BUN 15 16  CREATININE 0.81 0.78  CALCIUM 9.1 8.6*  MG  --  2.2   Liver Function Tests: Recent Labs  Lab 10/25/23 0241  AST 21  ALT 11  ALKPHOS 52  BILITOT 0.5  PROT 8.3*  ALBUMIN 3.9   No results for input(s): "LIPASE", "AMYLASE" in the last 168 hours. No results for input(s): "AMMONIA" in the last 168 hours. CBC: Recent Labs  Lab 10/25/23 0241 10/26/23 0445  WBC 17.3* 9.7  NEUTROABS 13.0*  --   HGB 13.8 12.1  HCT 43.4 40.2  MCV 80.8 84.6  PLT 485* 377   Cardiac Enzymes: No results for input(s): "CKTOTAL", "CKMB", "CKMBINDEX", "TROPONINI" in  the last 168 hours. BNP: Invalid input(s): "POCBNP" CBG: No results for input(s): "GLUCAP" in the last 168 hours. D-Dimer No results for input(s): "DDIMER" in the last 72 hours. Hgb A1c No results for input(s): "HGBA1C" in the last 72 hours. Lipid Profile No results for input(s): "CHOL", "HDL", "LDLCALC", "TRIG", "CHOLHDL", "LDLDIRECT" in the last 72 hours. Thyroid function studies Recent Labs    10/26/23 0445  TSH 0.581   Anemia work up No results for input(s): "VITAMINB12", "FOLATE", "FERRITIN", "TIBC", "IRON", "RETICCTPCT" in the last 72 hours. Urinalysis    Component Value Date/Time   COLORURINE YELLOW 08/18/2022 2239   APPEARANCEUR HAZY (A) 08/18/2022 2239   LABSPEC 1.008 08/18/2022 2239   PHURINE 6.0 08/18/2022 2239   GLUCOSEU NEGATIVE 08/18/2022 2239   HGBUR SMALL (A) 08/18/2022 2239   BILIRUBINUR NEGATIVE 08/18/2022 2239   BILIRUBINUR small (A) 03/19/2019 1213   KETONESUR NEGATIVE 08/18/2022 2239   PROTEINUR NEGATIVE 08/18/2022 2239   UROBILINOGEN 1.0 03/19/2019 1213   NITRITE NEGATIVE  08/18/2022 2239   LEUKOCYTESUR NEGATIVE 08/18/2022 2239   Sepsis Labs Recent Labs  Lab 10/25/23 0241 10/26/23 0445  WBC 17.3* 9.7   Microbiology Recent Results (from the past 240 hours)  Resp panel by RT-PCR (RSV, Flu A&B, Covid) Anterior Nasal Swab     Status: None   Collection Time: 10/25/23  7:22 AM   Specimen: Anterior Nasal Swab  Result Value Ref Range Status   SARS Coronavirus 2 by RT PCR NEGATIVE NEGATIVE Final    Comment: (NOTE) SARS-CoV-2 target nucleic acids are NOT DETECTED.  The SARS-CoV-2 RNA is generally detectable in upper respiratory specimens during the acute phase of infection. The lowest concentration of SARS-CoV-2 viral copies this assay can detect is 138 copies/mL. A negative result does not preclude SARS-Cov-2 infection and should not be used as the sole basis for treatment or other patient management decisions. A negative result may occur with  improper specimen collection/handling, submission of specimen other than nasopharyngeal swab, presence of viral mutation(s) within the areas targeted by this assay, and inadequate number of viral copies(<138 copies/mL). A negative result must be combined with clinical observations, patient history, and epidemiological information. The expected result is Negative.  Fact Sheet for Patients:  BloggerCourse.com  Fact Sheet for Healthcare Providers:  SeriousBroker.it  This test is no t yet approved or cleared by the [place] FDA and  has been authorized for detection and/or diagnosis of SARS-CoV-2 by FDA under an Emergency Use Authorization (EUA). This EUA will remain  in effect (meaning this test can be used) for the duration of the COVID-19 declaration under Section 564(b)(1) of the Act, 21 U.S.C.section 360bbb-3(b)(1), unless the authorization is terminated  or revoked sooner.       Influenza A by PCR NEGATIVE NEGATIVE Final   Influenza B by PCR  NEGATIVE NEGATIVE Final    Comment: (NOTE) The Xpert Xpress SARS-CoV-2/FLU/RSV plus assay is intended as an aid in the diagnosis of influenza from Nasopharyngeal swab specimens and should not be used as a sole basis for treatment. Nasal washings and aspirates are unacceptable for Xpert Xpress SARS-CoV-2/FLU/RSV testing.  Fact Sheet for Patients: BloggerCourse.com  Fact Sheet for Healthcare Providers: SeriousBroker.it  This test is not yet approved or cleared by the [place] FDA and has been authorized for detection and/or diagnosis of SARS-CoV-2 by FDA under an Emergency Use Authorization (EUA). This EUA will remain in effect (meaning this test can be used) for the duration of the COVID-19 declaration under Section  564(b)(1) of the Act, 21 U.S.C. section 360bbb-3(b)(1), unless the authorization is terminated or revoked.     Resp Syncytial Virus by PCR NEGATIVE NEGATIVE Final    Comment: (NOTE) Fact Sheet for Patients: BloggerCourse.com  Fact Sheet for Healthcare Providers: SeriousBroker.it  This test is not yet approved or cleared by the [place] FDA and has been authorized for detection and/or diagnosis of SARS-CoV-2 by FDA under an Emergency Use Authorization (EUA). This EUA will remain in effect (meaning this test can be used) for the duration of the COVID-19 declaration under Section 564(b)(1) of the Act, 21 U.S.C. section 360bbb-3(b)(1), unless the authorization is terminated or revoked.  Performed at [facility], [address], [place], [place] [address]   MRSA Next Gen by PCR, Nasal     Status: None   Collection Time: 10/25/23  5:00 PM   Specimen: Nasal Mucosa; Nasal Swab  Result Value Ref Range Status   MRSA by PCR Next Gen NOT DETECTED NOT DETECTED Final    Comment: (NOTE) The GeneXpert MRSA Assay (FDA approved for NASAL specimens only), is one  component of a comprehensive MRSA colonization surveillance program. It is not intended to diagnose MRSA infection nor to guide or monitor treatment for MRSA infections. Test performance is not FDA approved in patients less than 26 years old. Performed at [facility], [address] [place], [place] [address]     Time coordinating discharge:  40 mins   SIGNED:  Standley Dakins, MD  [facility] 10/27/2023, 9:25 AM How to contact the [facility] Attending or Consulting provider 7A - 7P or covering provider during after hours 7P -7A, for this patient?  Check the care team in [facility] and look for a) attending/consulting [facility] provider listed and b) the [facility] team listed Log into [email or web address] and use [facility] universal password to access. If you do not have the password, please contact the hospital operator. Locate the [facility] provider you are looking for under [facility] and page to a number that you can be directly reached. If you still have difficulty reaching the provider, please page the [facility] (Director on Call) for the Hospitalists listed on amion for assistance.

## 2023-12-21 ENCOUNTER — Other Ambulatory Visit: Payer: Self-pay

## 2023-12-21 ENCOUNTER — Ambulatory Visit
Admission: EM | Admit: 2023-12-21 | Discharge: 2023-12-21 | Disposition: A | Discharge status: Home / Self Care | Payer: MEDICAID | Attending: Nurse Practitioner | Admitting: Nurse Practitioner

## 2023-12-21 ENCOUNTER — Encounter: Payer: Self-pay | Admitting: Emergency Medicine

## 2023-12-21 DIAGNOSIS — K0889 Other specified disorders of teeth and supporting structures: Secondary | ICD-10-CM | POA: Diagnosis not present

## 2023-12-21 MED ORDER — LIDOCAINE VISCOUS HCL 2 % MT SOLN: 15.0000 mL | OROMUCOSAL | 0 refills | Status: AC | PRN

## 2023-12-21 MED ORDER — TRAMADOL HCL 50 MG PO TABS: 50.0000 mg | ORAL_TABLET | Freq: Two times a day (BID) | ORAL | 0 refills | Status: AC | PRN

## 2023-12-21 MED ORDER — CLINDAMYCIN HCL 150 MG PO CAPS: 450.0000 mg | ORAL_CAPSULE | Freq: Three times a day (TID) | ORAL | 0 refills | Status: AC

## 2023-12-21 NOTE — Discharge Instructions (Signed)
 We are treating you today for dental abscess with clindamycin.  Take 3 pills every 8 hours for 7 days.  Recommend close follow-up with dentist -dental resource given today.  Continue Tylenol every 6 hours alternating with tramadol every 12 hours for severe pain.  You can use topical lidocaine in the areas that are the most painful.

## 2023-12-21 NOTE — ED Triage Notes (Addendum)
 Pt reports mouth pain and swelling, intermittent fever that has increased x2 days. Pt reports possible abscess to right upper, reports chipped tooth bottom left. Pt tearful and intermittently spitting in triage. Airway patent.

## 2023-12-21 NOTE — ED Provider Notes (Signed)
 [facility]    CSN: [id number] Arrival date & time: 12/21/23  1827      History   Chief Complaint Chief Complaint  Patient presents with   Abscess    HPI Theresa Crawford is a 43 y.o. female.   Patient presents today with 2-day history of mouth pain, swelling, and intermittent tactile fever.  She reports chipped tooth in the bottom left jaw and possible abscess to the right upper jaw.  Reports she does not currently have a dentist.  Has been taking Tylenol and using clove oil without much improvement in pain.  She is unable to take NSAIDs due to history of CHF.    Past Medical History:  Diagnosis Date   Anxiety    Arthritis    "lower back" (04/04/2018)   Bipolar disorder (HCC)    Chronic bronchitis (HCC)    Chronic diastolic CHF (congestive heart failure) (HCC)    Chronic lower back pain    DDD (degenerative disc disease), lumbar    Depression    Dyspnea    GERD (gastroesophageal reflux disease)    Headache    "weekly" (04/04/2018), miragrains in past (04/03/2019)   Heart murmur    Hypertension    Mitral valve disease    Normocytic anemia    Obesity    Palpitations    Pericarditis    Pneumonia    Premature atrial contractions    Pulmonary hypertension (HCC)    PVC's (premature ventricular contractions)    a. h/o palpitations with event monitor in 03/2017 showing NSR with PACs/PVCs.   Recurrent mitral valve stenosis and regurgitation s/p mitral valve repair    S/P mitral valve repair 03/27/2013   Dr. Darcus Austin - [place], [place] - complex valvuloplasty including resuspension of entire posterior leaflet using Gore-tex neochords and 30 mm Edwards Physio ring annuloplasty   S/P redo mitral valve replacement with mechanical valve 04/05/2019   29 mm Sorin Carbomedics Optiform bileaflet mechanical valve   S/P tricuspid valve repair 04/05/2019   28 mm Edwards mc3 ring annuloplasty   Tobacco abuse    Tricuspid regurgitation     Patient Active Problem List    Diagnosis Date Noted   Cocaine abuse (HCC) 10/25/2023   S/P TVR (tricuspid valve repair) 10/25/2023   S/P MVR (mitral valve replacement) 10/25/2023   Acute on chronic diastolic heart failure (HCC) 10/25/2023   Acute exacerbation of CHF (congestive heart failure) (HCC) 10/10/2023   Elevated d-dimer 10/10/2023   Valvular heart disease 10/10/2023   Obesity, Class III, BMI 40-49.9 (morbid obesity) (HCC) 10/10/2023   Erroneous encounter - disregard 05/23/2023   Acute on chronic systolic CHF (congestive heart failure) (HCC) 04/07/2023   Substance abuse (HCC) 08/19/2022   Atrial flutter with rapid ventricular response (HCC)    Acute HFrEF 01/12/2022   Subtherapeutic international normalized ratio (INR) 01/12/2022   S/P tricuspid valve repair 04/05/2019   Obesity (BMI 30-39.9) 03/29/2019   Sarcoidosis    Acute on chronic diastolic congestive heart failure (HCC) 03/24/2019   Normocytic anemia    Dysuria 03/19/2019   Acute CHF (congestive heart failure) (HCC) 03/04/2019   MDD (major depressive disorder), recurrent episode (HCC) 04/05/2018   S/P tooth extraction 04/03/2018   Suicidal ideation 04/03/2018   MDD (major depressive disorder)    Chronic diastolic congestive heart failure (HCC)    Tricuspid regurgitation    Hypomagnesemia 03/07/2018   Tobacco abuse    Dyspnea 01/11/2018   Prolonged QT interval 09/10/2017   Normochromic normocytic  anemia    Pulmonary edema 07/05/2017   Hypokalemia 06/12/2017   Pulmonary hypertension (HCC) 06/12/2017   Flash pulmonary edema (HCC) 06/11/2017   Depression with anxiety 06/11/2017   Recurrent mitral valve stenosis and regurgitation s/p mitral valve repair    Acute pulmonary edema (HCC) 10/10/2016   CAP (community acquired pneumonia) 05/25/2016   Leukocytosis 05/24/2016   Acute respiratory failure with hypoxia (HCC) 05/23/2016   Acute on chronic diastolic CHF (congestive heart failure) (HCC) 05/23/2016   Cough with hemoptysis 05/23/2016    Postpartum complication pericarditis in 2009 with eventual needing MVR in 2015 05/23/2016   Anemia, iron deficiency 05/23/2016   Hypertension    SOB (shortness of breath)    Respiratory distress 02/16/2016   Essential hypertension 02/16/2016   S/P mitral valve repair 03/27/2013    Past Surgical History:  Procedure Laterality Date   ABDOMINAL HERNIA REPAIR  ~ 2011   CARDIAC CATHETERIZATION  2014   CARDIOVERSION N/A 01/14/2022   Procedure: CARDIOVERSION;  Surgeon: Antoine Poche, MD;  Location: [facility]S;  Service: Endoscopy;  Laterality: N/A;   CESAREAN SECTION  2004; 2009   HERNIA REPAIR     MITRAL VALVE REPAIR  03/27/2013   Dr. Darcus Austin - [place], [place]. - complex valvuloplasty including resuspension of posterior leaflet with 30 mm CE Physio ring annuloplasty   MITRAL VALVE REPLACEMENT N/A 04/05/2019   Procedure: Mitral Valve (Mv) Replacement using Carbomedics Optiform Valve size 29mm;  Surgeon: Purcell Nails, MD;  Location: [facility] OR;  Service: Open Heart Surgery;  Laterality: N/A;   MULTIPLE EXTRACTIONS WITH ALVEOLOPLASTY N/A 04/03/2018   Procedure: Extraction of tooth #30 with alveoloplasty and gross debridement of remaining teeth;  Surgeon: Charlynne Pander, DDS;  Location: [facility] OR;  Service: Oral Surgery;  Laterality: N/A;   PARTIAL HYSTERECTOMY  2011   PID w/problem with fallopian tubes, had left fallopian and left ovary removed, pt still having periods   RIGHT HEART CATH N/A 03/27/2019   Procedure: RIGHT HEART CATH;  Surgeon: Laurey Morale, MD;  Location: [facility] INVASIVE CV LAB;  Service: Cardiovascular;  Laterality: N/A;   RIGHT/LEFT HEART CATH AND CORONARY ANGIOGRAPHY N/A 03/05/2019   Procedure: RIGHT/LEFT HEART CATH AND CORONARY ANGIOGRAPHY;  Surgeon: Kathleene Hazel, MD;  Location: [facility] INVASIVE CV LAB;  Service: Cardiovascular;  Laterality: N/A;   TEE WITHOUT CARDIOVERSION N/A 05/26/2016   Procedure: TRANSESOPHAGEAL ECHOCARDIOGRAM (TEE);  Surgeon: [name],  MD;  Location: [facility] ENDO SUITE;  Service: Cardiovascular;  Laterality: N/A;   TEE WITHOUT CARDIOVERSION N/A 01/25/2018   Procedure: TRANSESOPHAGEAL ECHOCARDIOGRAM (TEE) WITH PROPOFOL;  Surgeon: Jonelle Sidle, MD;  Location: [facility];  Service: Cardiovascular;  Laterality: N/A;   TEE WITHOUT CARDIOVERSION N/A 03/05/2019   Procedure: TRANSESOPHAGEAL ECHOCARDIOGRAM (TEE);  Surgeon: Pricilla Riffle, MD;  Location: [facility] ENDOSCOPY;  Service: Cardiovascular;  Laterality: N/A;   TEE WITHOUT CARDIOVERSION N/A 04/05/2019   Procedure: TRANSESOPHAGEAL ECHOCARDIOGRAM (TEE);  Surgeon: Purcell Nails, MD;  Location: [facility] OR;  Service: Open Heart Surgery;  Laterality: N/A;   TEE WITHOUT CARDIOVERSION N/A 01/14/2022   Procedure: TRANSESOPHAGEAL ECHOCARDIOGRAM (TEE);  Surgeon: Antoine Poche, MD;  Location: [facility]S;  Service: Endoscopy;  Laterality: N/A;   TRICUSPID VALVE REPLACEMENT N/A 04/05/2019   Procedure: Tricuspid Valve Repair using Edwards MC3 Tricuspid ring size 28mm;  Surgeon: Purcell Nails, MD;  Location: [facility] OR;  Service: Open Heart Surgery;  Laterality: N/A;    OB History     Gravida  2   Para  2   Term  2   Preterm      AB      Living         SAB      IAB      Ectopic      Multiple      Live Births               Home Medications    Prior to Admission medications   Medication Sig Start Date End Date Taking? Authorizing Provider  clindamycin (CLEOCIN) 150 MG capsule Take 3 capsules (450 mg total) by mouth 3 (three) times daily for 7 days. 12/21/23 12/28/23 Yes Cathlean Marseilles A, NP  lidocaine (XYLOCAINE) 2 % solution Use as directed 15 mLs in the mouth or throat every 3 (three) hours as needed for mouth pain. 12/21/23  Yes Valentino Nose, NP  traMADol (ULTRAM) 50 MG tablet Take 1 tablet (50 mg total) by mouth every 12 (twelve) hours as needed for up to 3 days. 12/21/23 12/24/23 Yes Valentino Nose, NP  acetaminophen (TYLENOL) 325 MG tablet Take 2 tablets (650 mg  total) by mouth every 6 (six) hours as needed for mild pain. 04/13/19   Ardelle Balls, PA-C  albuterol (VENTOLIN HFA) 108 (90 Base) MCG/ACT inhaler Inhale 2 puffs into the lungs every 6 (six) hours as needed for wheezing or shortness of breath. 04/10/23   Shon Hale, MD  aspirin EC 81 MG tablet Take 1 tablet (81 mg total) by mouth daily with breakfast. 04/10/23 04/09/24  Shon Hale, MD  busPIRone (BUSPAR) 15 MG tablet Take 1 tablet (15 mg total) by mouth 3 (three) times daily. 04/10/23   Shon Hale, MD  Cholecalciferol (VITAMIN D-3 PO) Take 1 capsule by mouth daily.    [provider]  cyanocobalamin (VITAMIN B12) 1000 MCG tablet Take 1 tablet (1,000 mcg total) by mouth daily. 04/10/23   Shon Hale, MD  dapagliflozin propanediol (FARXIGA) 10 MG TABS tablet Take 1 tablet (10 mg total) by mouth daily. 10/27/23   Johnson, Clanford L, MD  diltiazem (CARDIZEM CD) 300 MG 24 hr capsule Take 1 capsule (300 mg total) by mouth daily. 10/28/23   Johnson, Clanford L, MD  ferrous sulfate 325 (65 FE) MG tablet Take 1 tablet (325 mg total) by mouth daily with breakfast. 04/10/23   Shon Hale, MD  folic acid (FOLVITE) 400 MCG tablet Take 1 tablet (400 mcg total) by mouth daily. 04/10/23   Shon Hale, MD  furosemide (LASIX) 80 MG tablet Take 1 tablet (80 mg total) by mouth daily. 04/10/23 04/09/24  Shon Hale, MD  hydrOXYzine (ATARAX) 25 MG tablet Take 1 tablet (25 mg total) by mouth every 6 (six) hours as needed for anxiety. 10/27/23   Johnson, Clanford L, MD  Omega-3 Fatty Acids (OMEGA-3 FISH OIL PO) Take 1 capsule by mouth daily at 6 (six) AM.    [provider]  potassium chloride SA (KLOR-CON M) 20 MEQ tablet Take 20 mEq by mouth 2 (two) times daily.    [provider]  sertraline (ZOLOFT) 50 MG tablet Take 1 tablet (50 mg total) by mouth daily. 04/10/23 04/09/24  Shon Hale, MD  traZODone (DESYREL) 100 MG tablet Take 1 tablet (100 mg total) by  mouth at bedtime. 04/10/23   Shon Hale, MD  warfarin (COUMADIN) 7.5 MG tablet Take 1 tablet (7.5 mg total) by mouth daily at 4 PM. 10/27/23   Cleora Fleet, MD  Family History Family History  Problem Relation Age of Onset   Heart failure Father        just received LVAD   Heart disease Paternal Grandfather        stent placement    Social History Social History   Tobacco Use   Smoking status: Every Day    Current packs/day: 0.50    Average packs/day: 0.5 packs/day for 24.2 years (12.1 ttl pk-yrs)    Types: Cigarettes    Start date: 11/03/1999   Smokeless tobacco: Never  Vaping Use   Vaping status: Never Used  Substance Use Topics   Alcohol use: Yes   Drug use: Not Currently    Types: Marijuana, Cocaine     Allergies   Bee venom, Lisinopril, Penicillins, Perflutren lipid microsphere, Shellfish allergy, and Contrast media [iodinated contrast media]   Review of Systems Review of Systems Per HPI  Physical Exam Triage Vital Signs ED Triage Vitals  Encounter Vitals Group     BP 12/21/23 1855 94/66     Systolic BP Percentile --      Diastolic BP Percentile --      Pulse Rate 12/21/23 1855 90     Resp 12/21/23 1855 20     Temp 12/21/23 1855 (!) 97.5 F (36.4 C)     Temp Source 12/21/23 1855 Oral     SpO2 12/21/23 1855 95 %     Weight --      Height --      Head Circumference --      Peak Flow --      Pain Score 12/21/23 1853 10     Pain Loc --      Pain Education --      Exclude from Growth Chart --    No data found.  Updated Vital Signs BP 94/66 (BP Location: Right Arm)   Pulse 90   Temp (!) 97.5 F (36.4 C) (Oral)   Resp 20   LMP 11/28/2023 (Approximate)   SpO2 95%   Visual Acuity Right Eye Distance:   Left Eye Distance:   Bilateral Distance:    Right Eye Near:   Left Eye Near:    Bilateral Near:     Physical Exam Vitals and nursing note reviewed.  Constitutional:      General: She is not in acute distress.    Appearance:  Normal appearance. She is not toxic-appearing.  HENT:     Right Ear: External ear normal.     Left Ear: External ear normal.     Mouth/Throat:     Mouth: Mucous membranes are moist. No oral lesions.     Dentition: Abnormal dentition. Dental tenderness, dental caries and dental abscesses present. No gingival swelling or gum lesions.     Pharynx: Oropharynx is clear.      Comments: Pustule noted to right upper jaw on approximately area marked consistent with developing abscess; there is significant dental tenderness to the right upper jaw.  No dental tenderness left lower or left upper jaws. Pulmonary:     Effort: Pulmonary effort is normal. No respiratory distress.  Musculoskeletal:     Cervical back: Normal range of motion.  Lymphadenopathy:     Cervical: No cervical adenopathy.  Skin:    General: Skin is warm and dry.     Capillary Refill: Capillary refill takes less than 2 seconds.  Neurological:     Mental Status: She is alert and oriented to person, place, and time.  Psychiatric:  Behavior: Behavior is cooperative.      UC Treatments / Results  Labs (all labs ordered are listed, but only abnormal results are displayed) Labs Reviewed - No data to display  EKG   Radiology No results found.  Procedures Procedures (including critical care time)  Medications Ordered in UC Medications - No data to display  Initial Impression / Assessment and Plan / UC Course  I have reviewed the triage vital signs and the nursing notes.  Pertinent labs & imaging results that were available during my care of the patient were reviewed by me and considered in my medical decision making (see chart for details).   Patient is well-appearing, normotensive, afebrile, not tachycardic, not tachypneic, oxygenating well on room air.    1. Dentalgia Concerning for dental abscess Treat with clindamycin 4 to 50 mg 3 times daily for 7 days Recommended continuing Tylenol for pain, start  topical lidocaine as well as tramadol every 12 hours as needed as patient is unable to take NSAIDs PDMP reviewed and appropriate Recommended close follow-up with dentist and resources given today  The patient was given the opportunity to ask questions.  All questions answered to their satisfaction.  The patient is in agreement to this plan.   Final Clinical Impressions(s) / UC Diagnoses   Final diagnoses:  Dentalgia     Discharge Instructions      We are treating you today for dental abscess with clindamycin.  Take 3 pills every 8 hours for 7 days.  Recommend close follow-up with dentist -dental resource given today.  Continue Tylenol every 6 hours alternating with tramadol every 12 hours for severe pain.  You can use topical lidocaine in the areas that are the most painful.      ED Prescriptions     Medication Sig Dispense Auth. Provider   clindamycin (CLEOCIN) 150 MG capsule Take 3 capsules (450 mg total) by mouth 3 (three) times daily for 7 days. 63 capsule Cathlean Marseilles A, NP   lidocaine (XYLOCAINE) 2 % solution Use as directed 15 mLs in the mouth or throat every 3 (three) hours as needed for mouth pain. 100 mL Cathlean Marseilles A, NP   traMADol (ULTRAM) 50 MG tablet Take 1 tablet (50 mg total) by mouth every 12 (twelve) hours as needed for up to 3 days. 6 tablet Valentino Nose, NP      I have reviewed the PDMP during this encounter.   Valentino Nose, NP 12/21/23 1950

## 2024-01-25 ENCOUNTER — Encounter: Payer: Self-pay | Admitting: Internal Medicine

## 2024-01-25 ENCOUNTER — Ambulatory Visit: Payer: MEDICAID | Attending: Internal Medicine | Admitting: Internal Medicine

## 2024-01-25 NOTE — Progress Notes (Signed)
 Erroneous encounter - please disregard.

## 2024-02-08 ENCOUNTER — Ambulatory Visit: Payer: MEDICAID | Admitting: Physician Assistant

## 2024-04-12 ENCOUNTER — Ambulatory Visit: Payer: MEDICAID
